# Patient Record
Sex: Female | Born: 1965
Health system: Southern US, Community
[De-identification: ages and names within clinical notes are randomized; demographics above are authoritative.]

## PROBLEM LIST (undated history)

## (undated) DIAGNOSIS — G2581 Restless legs syndrome: Secondary | ICD-10-CM

## (undated) DIAGNOSIS — G43909 Migraine, unspecified, not intractable, without status migrainosus: Secondary | ICD-10-CM

## (undated) DIAGNOSIS — C029 Malignant neoplasm of tongue, unspecified: Secondary | ICD-10-CM

## (undated) DIAGNOSIS — R197 Diarrhea, unspecified: Secondary | ICD-10-CM

## (undated) DIAGNOSIS — D649 Anemia, unspecified: Secondary | ICD-10-CM

## (undated) DIAGNOSIS — F419 Anxiety disorder, unspecified: Secondary | ICD-10-CM

## (undated) DIAGNOSIS — I10 Essential (primary) hypertension: Secondary | ICD-10-CM

## (undated) DIAGNOSIS — G4730 Sleep apnea, unspecified: Secondary | ICD-10-CM

## (undated) DIAGNOSIS — M419 Scoliosis, unspecified: Secondary | ICD-10-CM

## (undated) DIAGNOSIS — Z8719 Personal history of other diseases of the digestive system: Secondary | ICD-10-CM

## (undated) DIAGNOSIS — R0602 Shortness of breath: Secondary | ICD-10-CM

## (undated) DIAGNOSIS — B019 Varicella without complication: Secondary | ICD-10-CM

## (undated) DIAGNOSIS — G4733 Obstructive sleep apnea (adult) (pediatric): Secondary | ICD-10-CM

## (undated) DIAGNOSIS — Z5189 Encounter for other specified aftercare: Secondary | ICD-10-CM

## (undated) DIAGNOSIS — T7840XA Allergy, unspecified, initial encounter: Secondary | ICD-10-CM

## (undated) DIAGNOSIS — M1990 Unspecified osteoarthritis, unspecified site: Secondary | ICD-10-CM

## (undated) DIAGNOSIS — F329 Major depressive disorder, single episode, unspecified: Secondary | ICD-10-CM

## (undated) DIAGNOSIS — J189 Pneumonia, unspecified organism: Secondary | ICD-10-CM

## (undated) DIAGNOSIS — R51 Headache: Secondary | ICD-10-CM

## (undated) DIAGNOSIS — K219 Gastro-esophageal reflux disease without esophagitis: Secondary | ICD-10-CM

## (undated) DIAGNOSIS — F32A Depression, unspecified: Secondary | ICD-10-CM

## (undated) DIAGNOSIS — Z87442 Personal history of urinary calculi: Secondary | ICD-10-CM

## (undated) DIAGNOSIS — C801 Malignant (primary) neoplasm, unspecified: Secondary | ICD-10-CM

## (undated) DIAGNOSIS — Z923 Personal history of irradiation: Secondary | ICD-10-CM

## (undated) HISTORY — DX: Anemia, unspecified: D64.9

## (undated) HISTORY — DX: Obstructive sleep apnea (adult) (pediatric): G47.33

## (undated) HISTORY — DX: Migraine, unspecified, not intractable, without status migrainosus: G43.909

## (undated) HISTORY — DX: Major depressive disorder, single episode, unspecified: F32.9

## (undated) HISTORY — DX: Allergy, unspecified, initial encounter: T78.40XA

## (undated) HISTORY — DX: Depression, unspecified: F32.A

## (undated) HISTORY — PX: UPPER GASTROINTESTINAL ENDOSCOPY: SHX188

## (undated) HISTORY — DX: Sleep apnea, unspecified: G47.30

## (undated) HISTORY — PX: TONSILLECTOMY: SUR1361

## (undated) HISTORY — DX: Encounter for other specified aftercare: Z51.89

## (undated) HISTORY — DX: Malignant neoplasm of tongue, unspecified: C02.9

## (undated) HISTORY — DX: Varicella without complication: B01.9

---
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
## 1988-06-18 HISTORY — PX: TUBAL LIGATION: SHX77

## 1999-11-02 ENCOUNTER — Encounter (INDEPENDENT_AMBULATORY_CARE_PROVIDER_SITE_OTHER): Payer: Self-pay | Admitting: *Deleted

## 1999-11-02 ENCOUNTER — Ambulatory Visit (HOSPITAL_BASED_OUTPATIENT_CLINIC_OR_DEPARTMENT_OTHER): Admission: RE | Admit: 1999-11-02 | Discharge: 1999-11-02 | Payer: Self-pay | Admitting: General Surgery

## 2001-10-09 ENCOUNTER — Other Ambulatory Visit: Admission: RE | Admit: 2001-10-09 | Discharge: 2001-10-09 | Payer: Self-pay | Admitting: Family Medicine

## 2002-07-09 ENCOUNTER — Encounter: Admission: RE | Admit: 2002-07-09 | Discharge: 2002-08-02 | Payer: Self-pay | Admitting: Family Medicine

## 2002-10-15 ENCOUNTER — Other Ambulatory Visit: Admission: RE | Admit: 2002-10-15 | Discharge: 2002-10-15 | Payer: Self-pay | Admitting: Family Medicine

## 2002-10-17 ENCOUNTER — Encounter: Payer: Self-pay | Admitting: Family Medicine

## 2002-10-17 ENCOUNTER — Encounter: Admission: RE | Admit: 2002-10-17 | Discharge: 2002-10-17 | Payer: Self-pay | Admitting: Family Medicine

## 2004-02-04 ENCOUNTER — Ambulatory Visit: Payer: Self-pay | Admitting: Family Medicine

## 2005-10-21 ENCOUNTER — Ambulatory Visit: Payer: Self-pay | Admitting: Obstetrics and Gynecology

## 2005-10-21 ENCOUNTER — Encounter: Payer: Self-pay | Admitting: Obstetrics and Gynecology

## 2007-01-19 ENCOUNTER — Emergency Department (HOSPITAL_COMMUNITY): Admission: EM | Admit: 2007-01-19 | Discharge: 2007-01-19 | Payer: Self-pay | Admitting: Emergency Medicine

## 2008-03-08 ENCOUNTER — Ambulatory Visit (HOSPITAL_COMMUNITY): Admission: RE | Admit: 2008-03-08 | Discharge: 2008-03-08 | Payer: Self-pay | Admitting: Obstetrics & Gynecology

## 2009-04-09 ENCOUNTER — Emergency Department (HOSPITAL_COMMUNITY): Admission: EM | Admit: 2009-04-09 | Discharge: 2009-04-09 | Payer: Self-pay | Admitting: Emergency Medicine

## 2009-04-09 ENCOUNTER — Emergency Department (HOSPITAL_COMMUNITY): Admission: EM | Admit: 2009-04-09 | Discharge: 2009-04-10 | Payer: Self-pay | Admitting: Emergency Medicine

## 2010-02-04 ENCOUNTER — Emergency Department (HOSPITAL_COMMUNITY)
Admission: EM | Admit: 2010-02-04 | Discharge: 2010-02-04 | Payer: Self-pay | Source: Home / Self Care | Admitting: Emergency Medicine

## 2010-02-09 LAB — CBC
HCT: 29.7 % — ABNORMAL LOW (ref 36.0–46.0)
Hemoglobin: 9.1 g/dL — ABNORMAL LOW (ref 12.0–15.0)
MCH: 23.4 pg — ABNORMAL LOW (ref 26.0–34.0)
MCHC: 30.6 g/dL (ref 30.0–36.0)
MCV: 76.3 fL — ABNORMAL LOW (ref 78.0–100.0)
Platelets: 259 10*3/uL (ref 150–400)
RBC: 3.89 MIL/uL (ref 3.87–5.11)
RDW: 16.4 % — ABNORMAL HIGH (ref 11.5–15.5)

## 2010-02-09 LAB — POCT CARDIAC MARKERS
Myoglobin, poc: 60.7 ng/mL (ref 12–200)
Troponin i, poc: <0.05 ng/mL (ref 0.00–0.09)

## 2010-02-13 ENCOUNTER — Other Ambulatory Visit (HOSPITAL_COMMUNITY): Payer: Self-pay | Admitting: *Deleted

## 2010-02-13 DIAGNOSIS — Z1231 Encounter for screening mammogram for malignant neoplasm of breast: Secondary | ICD-10-CM

## 2010-02-13 DIAGNOSIS — Z139 Encounter for screening, unspecified: Secondary | ICD-10-CM

## 2010-03-02 ENCOUNTER — Ambulatory Visit: Payer: Self-pay | Admitting: Endocrinology

## 2010-03-09 ENCOUNTER — Ambulatory Visit: Payer: Self-pay | Admitting: Cardiology

## 2010-03-10 ENCOUNTER — Ambulatory Visit (HOSPITAL_COMMUNITY)
Admission: RE | Admit: 2010-03-10 | Discharge: 2010-03-10 | Disposition: A | Discharge status: Home / Self Care | Payer: BC Managed Care – PPO | Source: Ambulatory Visit | Attending: Neurology | Admitting: Neurology

## 2010-03-10 DIAGNOSIS — Z1231 Encounter for screening mammogram for malignant neoplasm of breast: Secondary | ICD-10-CM | POA: Insufficient documentation

## 2010-03-11 ENCOUNTER — Other Ambulatory Visit: Payer: Self-pay | Admitting: Obstetrics

## 2010-03-11 ENCOUNTER — Other Ambulatory Visit: Payer: Self-pay | Admitting: Family Medicine

## 2010-03-11 DIAGNOSIS — R928 Other abnormal and inconclusive findings on diagnostic imaging of breast: Secondary | ICD-10-CM

## 2010-03-12 ENCOUNTER — Other Ambulatory Visit: Payer: Self-pay | Admitting: Obstetrics

## 2010-03-12 ENCOUNTER — Other Ambulatory Visit: Payer: Self-pay | Admitting: Family Medicine

## 2010-03-12 DIAGNOSIS — R928 Other abnormal and inconclusive findings on diagnostic imaging of breast: Secondary | ICD-10-CM

## 2010-03-18 ENCOUNTER — Encounter (INDEPENDENT_AMBULATORY_CARE_PROVIDER_SITE_OTHER): Payer: Self-pay | Admitting: *Deleted

## 2010-03-19 ENCOUNTER — Ambulatory Visit: Payer: Self-pay | Admitting: Endocrinology

## 2010-03-19 DIAGNOSIS — Z0289 Encounter for other administrative examinations: Secondary | ICD-10-CM

## 2010-03-20 ENCOUNTER — Ambulatory Visit
Admission: RE | Admit: 2010-03-20 | Discharge: 2010-03-20 | Disposition: A | Discharge status: Home / Self Care | Payer: BC Managed Care – PPO | Source: Ambulatory Visit | Attending: Family Medicine | Admitting: Family Medicine

## 2010-03-20 DIAGNOSIS — R928 Other abnormal and inconclusive findings on diagnostic imaging of breast: Secondary | ICD-10-CM

## 2010-03-26 ENCOUNTER — Ambulatory Visit: Payer: BC Managed Care – PPO | Admitting: Internal Medicine

## 2010-03-26 NOTE — Letter (Signed)
Summary: Appointment - Missed  [facility], [place]  [address] [place]   [place], [place] [address]   Phone: [phone]  Fax: [phone]        March 18, 2010 MRN: [id number]     Kristen [name] [address] [place], [place]  [address]     Dear Kristen [name],  [facility] records indicate you missed your appointment on  March 09, 2010 at 11:00am with Dr. Aundra Dubin. It is very important that we reach you to reschedule this appointment. We look forward to participating in your health care needs. Please contact us at the number listed above at your earliest convenience to reschedule this appointment.     Sincerely,  [facility] Scheduling Team

## 2010-04-02 ENCOUNTER — Ambulatory Visit: Payer: BC Managed Care – PPO | Admitting: Internal Medicine

## 2010-04-02 DIAGNOSIS — Z0289 Encounter for other administrative examinations: Secondary | ICD-10-CM

## 2010-04-10 LAB — COMPREHENSIVE METABOLIC PANEL
ALT: 12 U/L (ref 0–35)
AST: 16 U/L (ref 0–37)
Albumin: 3.4 g/dL — ABNORMAL LOW (ref 3.5–5.2)
Alkaline Phosphatase: 75 U/L (ref 39–117)
BUN: 9 mg/dL (ref 6–23)
CO2: 22 mEq/L (ref 19–32)
Calcium: 8.1 mg/dL — ABNORMAL LOW (ref 8.4–10.5)
Chloride: 108 mEq/L (ref 96–112)
Creatinine, Ser: 0.83 mg/dL (ref 0.4–1.2)
GFR calc Af Amer: >60 mL/min (ref >60)
GFR calc non Af Amer: >60 mL/min (ref >60)
Glucose, Bld: 106 mg/dL — ABNORMAL HIGH (ref 70–99)
Potassium: 3.4 mEq/L — ABNORMAL LOW (ref 3.5–5.1)
Sodium: 138 mEq/L (ref 135–145)
Total Bilirubin: 0.9 mg/dL (ref 0.3–1.2)
Total Protein: 7 g/dL (ref 6.0–8.3)

## 2010-04-10 LAB — URINALYSIS, ROUTINE W REFLEX MICROSCOPIC
Bilirubin Urine: NEGATIVE
Glucose, UA: NEGATIVE mg/dL
Hgb urine dipstick: NEGATIVE
Ketones, ur: NEGATIVE mg/dL
Nitrite: POSITIVE — AB
Protein, ur: NEGATIVE mg/dL
Specific Gravity, Urine: 1.021 (ref 1.005–1.030)
Urobilinogen, UA: 1 mg/dL (ref 0.0–1.0)
pH: 6 (ref 5.0–8.0)

## 2010-04-10 LAB — CBC
HCT: 30.8 % — ABNORMAL LOW (ref 36.0–46.0)
Hemoglobin: 9.8 g/dL — ABNORMAL LOW (ref 12.0–15.0)
MCHC: 31.6 g/dL (ref 30.0–36.0)
MCV: 72.9 fL — ABNORMAL LOW (ref 78.0–100.0)
Platelets: 261 10*3/uL (ref 150–400)
RBC: 4.23 MIL/uL (ref 3.87–5.11)
RDW: 17.9 % — ABNORMAL HIGH (ref 11.5–15.5)
WBC: 4.8 10*3/uL (ref 4.0–10.5)

## 2010-04-10 LAB — DIFFERENTIAL
Eosinophils Relative: 0 % (ref 0–5)
Lymphocytes Relative: 25 % (ref 12–46)
Lymphs Abs: 1.2 10*3/uL (ref 0.7–4.0)
Monocytes Absolute: 0.3 10*3/uL (ref 0.1–1.0)
Monocytes Relative: 6 % (ref 3–12)

## 2010-04-10 LAB — URINE CULTURE

## 2010-04-10 LAB — URINE MICROSCOPIC-ADD ON

## 2010-04-10 LAB — POCT PREGNANCY, URINE: Preg Test, Ur: NEGATIVE

## 2010-04-10 LAB — LIPASE, BLOOD: Lipase: 20 U/L (ref 11–59)

## 2010-04-21 ENCOUNTER — Encounter: Payer: Self-pay | Admitting: Cardiology

## 2010-06-05 NOTE — Group Therapy Note (Signed)
NAME:  Kristen [name], Kristen [name] NO.:  [id number]   MEDICAL RECORD NO.:  [id number]          PATIENT TYPE:  WOC   LOCATION:  [facility]                   FACILITY:  [facility]   PHYSICIAN:  Andrew Au, MD        DATE OF BIRTH:  03/29/1965   DATE OF SERVICE:  10/21/2005                                    CLINIC NOTE   The patient is a 44 year old gravida 57, para 3-0-0-3, with an unremarkable  past medical history except for at age of 31 had the placement of a steel  rod for scoliosis in the back.  She is in pretty good health except for her  weight, which is somewhat elevated at 251, but she is 5 feet 9 inches tall.  She is in four a routine Pap smear and during our conversation, we got onto  the subject of any other problems going on with her and she stated that she  was having very heavy periods that were regular but lasted for 5-7 days and  passed huge clots.  She is not taking any iron therapy, and we suggested she  do that.  We are going to need a CBC and follow her up as far as that  problem goes.  She also discussed the fact that she has severe migraines  since the age of 45 that have been more common lately because of the death  of her father.  She gets usually at least one every 3 weeks.  I talked with  her in detail about trigger mechanisms and treatment for migraine.  I told  her we would try her on some propranolol suppression 40 mg b.i.d. as well as  Imitrex 100 mg, which we will give her to treat the headaches with.  I told  her she should follow up with an internal medicine specialist for that  problem.   PHYSICAL EXAMINATION:  Genitalia is normal.  BUS within normal limits.  Vagina is clean and well-rugated.  The cervix is clean and parous.  The  uterus is anterior, normal size, shape and consistency.  Adnexa could not be  well-palpated.   The patient was given a prescription for propranolol and Imitrex.  Pap smear  done.     ______________________________  Andrew Au, MD     PR/MEDQ  D:  10/21/2005  T:  10/22/2005  Job:  [id number]

## 2010-06-05 NOTE — Op Note (Signed)
[place]  Patient:    Kristen [name], Kristen [name]                    MRN: [id number] Proc. Date: 11/02/99 Adm. Date:  [id number] Attending:  Barbera Setters CC:         Candace Gallus, M.D., [place]   Operative Report  PREOPERATIVE DIAGNOSIS:  Soft tissue mass, right buttock.  POSTOPERATIVE DIAGNOSIS:  Soft tissue mass, right buttock.  PROCEDURE:  Excision soft tissue mass, right buttock (dimensions 3.0 x 2.0 x 2.0 cm).  SURGEON:  Edsel Petrin. Dalbert Batman, M.D.  OPERATIVE INDICATIONS:  This is a 45 year old black female who has had a soft tissue mass at the base of her right buttock near the perineum all of her life.  This has been enlarging over the past year or two.  This has never been inflamed and has never had any drainage.  It is becoming large enough to interfere with ambulation, and she would like this excised.  One examination, she has a 3.0 x 2.0 cm protuberant soft tissue mass at the base of the right buttock very close to the midline.  This suggests a broad-based lipoma.  There is no adenopathy.  She is brought to the operating room electively.  DESCRIPTION OF PROCEDURE:  Following the induction of general endotracheal anesthesia, the patient was placed in the dorsal lithotomy position.  The perineum and buttock were prepped and draped in a sterile fashion.  Incision was marked with a marking pen slightly oblique in Langers lines.  The tissues were infiltrated with 0.5% Marcaine with epinephrine.  An oblique elliptical incision was made around the base of this mass.  The incision itself was about 5 cm in length.  With sharp dissection and electrocautery, we excised what appeared to be a large fatty tumor.  This extended into the depths of the subcutaneous tissue for a short distance, and all of this fatty tissue was excised.  This had a benign appearance and texture.  This specimen was sent for routine  histology.  The wound was irrigated with saline.  Hemostasis was achieved with electrocautery and was excellent.  The skin was closed with a running subcuticular suture of 3-0 Vicryl and Steri-Strips.  A clean bandage was placed, and the patient was taken to the recovery room in stable condition.  Estimated blood loss was about 10 cc.  Complications:  None. Sponge, needle, and instrument counts were correct. DD:  11/02/99 TD:  11/02/99 Job: [id number]

## 2010-06-19 ENCOUNTER — Other Ambulatory Visit (HOSPITAL_COMMUNITY): Payer: BC Managed Care – PPO

## 2010-06-26 ENCOUNTER — Ambulatory Visit (HOSPITAL_COMMUNITY): Admission: RE | Admit: 2010-06-26 | Payer: Self-pay | Source: Ambulatory Visit | Admitting: Obstetrics

## 2010-10-07 LAB — URINE CULTURE: Colony Count: >=100000

## 2010-10-07 LAB — POCT URINALYSIS DIP (DEVICE)
Bilirubin Urine: NEGATIVE
Glucose, UA: NEGATIVE
Nitrite: POSITIVE — AB

## 2010-11-07 ENCOUNTER — Inpatient Hospital Stay (INDEPENDENT_AMBULATORY_CARE_PROVIDER_SITE_OTHER)
Admission: RE | Admit: 2010-11-07 | Discharge: 2010-11-07 | Disposition: A | Discharge status: Home / Self Care | Payer: BC Managed Care – PPO | Source: Ambulatory Visit | Attending: Emergency Medicine | Admitting: Emergency Medicine

## 2010-11-07 DIAGNOSIS — M5430 Sciatica, unspecified side: Secondary | ICD-10-CM

## 2011-06-16 ENCOUNTER — Emergency Department (INDEPENDENT_AMBULATORY_CARE_PROVIDER_SITE_OTHER): Payer: BC Managed Care – PPO

## 2011-06-16 ENCOUNTER — Emergency Department (INDEPENDENT_AMBULATORY_CARE_PROVIDER_SITE_OTHER)
Admission: EM | Admit: 2011-06-16 | Discharge: 2011-06-16 | Disposition: A | Discharge status: Home / Self Care | Payer: BC Managed Care – PPO | Source: Home / Self Care | Attending: Emergency Medicine | Admitting: Emergency Medicine

## 2011-06-16 ENCOUNTER — Encounter (HOSPITAL_COMMUNITY): Payer: Self-pay | Admitting: *Deleted

## 2011-06-16 DIAGNOSIS — S43429A Sprain of unspecified rotator cuff capsule, initial encounter: Secondary | ICD-10-CM

## 2011-06-16 DIAGNOSIS — S20219A Contusion of unspecified front wall of thorax, initial encounter: Secondary | ICD-10-CM

## 2011-06-16 DIAGNOSIS — S46019A Strain of muscle(s) and tendon(s) of the rotator cuff of unspecified shoulder, initial encounter: Secondary | ICD-10-CM

## 2011-06-16 DIAGNOSIS — S139XXA Sprain of joints and ligaments of unspecified parts of neck, initial encounter: Secondary | ICD-10-CM

## 2011-06-16 DIAGNOSIS — S161XXA Strain of muscle, fascia and tendon at neck level, initial encounter: Secondary | ICD-10-CM

## 2011-06-16 HISTORY — DX: Essential (primary) hypertension: I10

## 2011-06-16 MED ORDER — TRAMADOL HCL 50 MG PO TABS: 100.0000 mg | ORAL_TABLET | Freq: Three times a day (TID) | ORAL | Status: AC | PRN

## 2011-06-16 MED ORDER — METHOCARBAMOL 500 MG PO TABS: 500.0000 mg | ORAL_TABLET | Freq: Three times a day (TID) | ORAL | Status: AC

## 2011-06-16 MED ORDER — MELOXICAM 15 MG PO TABS: 15.0000 mg | ORAL_TABLET | Freq: Every day | ORAL | Status: AC

## 2011-06-16 NOTE — Discharge Instructions (Signed)
Cervical Sprain A cervical sprain is an injury in the neck in which the ligaments are stretched or torn. The ligaments are the tissues that hold the bones of the neck (vertebrae) in place.Cervical sprains can range from very mild to very severe. Most cervical sprains get better in 1 to 3 weeks, but it depends on the cause and extent of the injury. Severe cervical sprains can cause the neck vertebrae to be unstable. This can lead to damage of the spinal cord and can result in serious nervous system problems. Your caregiver will determine whether your cervical sprain is mild or severe. CAUSES  Severe cervical sprains may be caused by:  Contact sport injuries (football, rugby, wrestling, hockey, auto racing, gymnastics, diving, martial arts, boxing).   Motor vehicle collisions.   Whiplash injuries. This means the neck is forcefully whipped backward and forward.   Falls.  Mild cervical sprains may be caused by:   Awkward positions, such as cradling a telephone between your ear and shoulder.   Sitting in a chair that does not offer proper support.   Working at a poorly Landscape architect station.   Activities that require looking up or down for long periods of time.  SYMPTOMS   Pain, soreness, stiffness, or a burning sensation in the front, back, or sides of the neck. This discomfort may develop immediately after injury or it may develop slowly and not begin for 24 hours or more after an injury.   Pain or tenderness directly in the middle of the back of the neck.   Shoulder or upper back pain.   Limited ability to move the neck.   Headache.   Dizziness.   Weakness, numbness, or tingling in the hands or arms.   Muscle spasms.   Difficulty swallowing or chewing.   Tenderness and swelling of the neck.  DIAGNOSIS  Most of the time, your caregiver can diagnose this problem by taking your history and doing a physical exam. Your caregiver will ask about any known problems, such as  arthritis in the neck or a previous neck injury. X-rays may be taken to find out if there are any other problems, such as problems with the bones of the neck. However, an X-ray often does not reveal the full extent of a cervical sprain. Other tests such as a computed tomography (CT) scan or magnetic resonance imaging (MRI) may be needed. TREATMENT  Treatment depends on the severity of the cervical sprain. Mild sprains can be treated with rest, keeping the neck in place (immobilization), and pain medicines. Severe cervical sprains need immediate immobilization and an appointment with an orthopedist or neurosurgeon. Several treatment options are available to help with pain, muscle spasms, and other symptoms. Your caregiver may prescribe:  Medicines, such as pain relievers, numbing medicines, or muscle relaxants.   Physical therapy. This can include stretching exercises, strengthening exercises, and posture training. Exercises and improved posture can help stabilize the neck, strengthen muscles, and help stop symptoms from returning.   A neck collar to be worn for short periods of time. Often, these collars are worn for comfort. However, certain collars may be worn to protect the neck and prevent further worsening of a serious cervical sprain.  HOME CARE INSTRUCTIONS   Put ice on the injured area.   Put ice in a plastic bag.   Place a towel between your skin and the bag.   Leave the ice on for 15 to 20 minutes, 3 to 4 times a day.  Only take over-the-counter or prescription medicines for pain, discomfort, or fever as directed by your caregiver.   Keep all follow-up appointments as directed by your caregiver.   Keep all physical therapy appointments as directed by your caregiver.   If a neck collar is prescribed, wear it as directed by your caregiver.   Do not drive while wearing a neck collar.   Make any needed adjustments to your work station to promote good posture.   Avoid positions  and activities that make your symptoms worse.   Warm up and stretch before being active to help prevent problems.  SEEK MEDICAL CARE IF:   Your pain is not controlled with medicine.   You are unable to decrease your pain medicine over time as planned.   Your activity level is not improving as expected.  SEEK IMMEDIATE MEDICAL CARE IF:   You develop any bleeding, stomach upset, or signs of an allergic reaction to your medicine.   Your symptoms get worse.   You develop new, unexplained symptoms.   You have numbness, tingling, weakness, or paralysis in any part of your body.  MAKE SURE YOU:   Understand these instructions.   Will watch your condition.   Will get help right away if you are not doing well or get worse.  Document Released: 11/01/2006 Document Revised: 12/24/2010 Document Reviewed: 10/07/2010 [name] Patient Information 2012 [place].Bone Bruise  A bone bruise is a small hidden fracture of the bone. It typically occurs with bones located close to the surface of the skin.  SYMPTOMS  The pain lasts longer than a normal bruise.   The bruised area is difficult to use.   There may be discoloration or swelling of the bruised area.   When a bone bruise is found with injury to the anterior cruciate ligament (in the knee) there is often an increased:   Amount of fluid in the knee   Time the fluid in the knee lasts.   Number of days until you are walking normally and regaining the motion you had before the injury.   Number of days with pain from the injury.  DIAGNOSIS  It can only be seen on X-rays known as MRIs. This stands for magnetic resonance imaging. A regular X-ray taken of a bone bruise would appear to be normal. A bone bruise is a common injury in the knee and the heel bone (calcaneus). The problems are similar to those produced by stress fractures, which are bone injuries caused by overuse. A bone bruise may also be a sign of other injuries. For  example, bone bruises are commonly found where an anterior cruciate ligament (ACL) in the knee has been pulled away from the bone (ruptured). A ligament is a tough fibrous material that connects bones together to make our joints stable. Bruises of the bone last a lot longer than bruises of the muscle or tissues beneath the skin. Bone bruises can last from days to months and are often more severe and painful than other bruises. TREATMENT Because bone bruises are sudden injuries you cannot often prevent them, other than by being extremely careful. Some things you can do to improve the condition are:  Apply ice to the sore area for 15 to 20 minutes, 3 to 4 times per day while awake for the first 2 days. Put the ice in a plastic bag, and place a towel between the bag of ice and your skin.   Keep your bruised area raised (elevated) when possible  to lessen swelling.   For activity:   Use crutches when necessary; do not put weight on the injured leg until you are no longer tender.   You may walk on your affected part as the pain allows, or as instructed.   Start weight bearing gradually on the bruised part.   Continue to use crutches or a cane until you can stand without causing pain, or as instructed.   If a plaster splint was applied, wear the splint until you are seen for a follow-up examination. Rest it on nothing harder than a pillow the first 24 hours. Do not put weight on it. Do not get it wet. You may take it off to take a shower or bath.   If an air splint was applied, more air may be blown into or out of the splint as needed for comfort. You may take it off at night and to take a shower or bath.   Wiggle your toes in the splint several times per day if you are able.   You may have been given an elastic bandage to use with the plaster splint or alone. The splint is too tight if you have numbness, tingling or if your foot becomes cold and blue. Adjust the bandage to make it comfortable.    Only take over-the-counter or prescription medicines for pain, discomfort, or fever as directed by your caregiver.   Follow all instructions for follow up with your caregiver. This includes any orthopedic referrals, physical therapy, and rehabilitation. Any delay in obtaining necessary care could result in a delay or failure of the bones to heal.  SEEK MEDICAL CARE IF:   You have an increase in bruising, swelling, or pain.   You notice coldness of your toes.   You do not get pain relief with medications.  SEEK IMMEDIATE MEDICAL CARE IF:   Your toes are numb or blue.   You have severe pain not controlled with medications.   If any of the problems that caused you to seek care are becoming worse.  Document Released: 03/27/2003 Document Revised: 12/24/2010 Document Reviewed: 08/09/2007 [facility] Patient Information 2012 [place].Chest Contusion You have been checked for injuries to your chest. Your caregiver has not found injuries serious enough to require hospitalization. It is common to have bruises and sore muscles after an injury. These tend to feel worse the first 24 hours. You may gradually develop more stiffness and soreness over the next several hours to several days. This usually feels worse the first morning following your injury. After a few days, you will usually begin to improve. The amount of improvement depends on the amount of damage. Following the accident, if the pain in any area continues to increase or you develop new areas of pain, you should see your primary caregiver or return to the Emergency Department for re-evaluation. HOME CARE INSTRUCTIONS   Put ice on sore areas every 2 hours for 20 minutes while awake for the next 2 days.   Drink extra fluids. Do not drink alcohol.   Activity as tolerated. Lifting may make pain worse.   Only take over-the-counter or prescription medicines for pain, discomfort, or fever as directed by your caregiver. Do not use  aspirin. This may increase bruising or increase bleeding.  SEEK IMMEDIATE MEDICAL CARE IF:   There is a worsening of any of the problems that brought you in for care.   Shortness of breath, dizziness or fainting develop.   You have chest pain, difficulty  breathing, or develop pain going down the left arm or up into jaw.   You feel sick to your stomach (nausea), vomiting or sweats.   You have increasing belly (abdominal) discomfort.   There is blood in your urine, stool, or if you vomit blood.   There is pain in either shoulder in an area where a shoulder strap would be.   You have feelings of lightheadedness, or if you should have a fainting episode.   You have numbness, tingling, weakness, or problems with the use of your arms or legs.   Severe headaches not relieved with medications develop.   You have a change in bowel or bladder control.   There is increasing pain in any areas of the body.  If you feel your symptoms are worsening, and you are not able to see your primary caregiver, return to the Emergency Department immediately. MAKE SURE YOU:   Understand these instructions.   Will watch your condition.   Will get help right away if you are not doing well or get worse.  Document Released: 09/29/2000 Document Revised: 12/24/2010 Document Reviewed: 08/23/2007 [facility] Patient Information 2012 [place].Rotator Cuff Injury The rotator cuff is the collective set of muscles and tendons that make up the stabilizing unit of your shoulder. This unit holds in the ball of the humerus (upper arm bone) in the socket of the scapula (shoulder blade). Injuries to this stabilizing unit most commonly come from sports or activities that cause the arm to be moved repeatedly over the head. Examples of this include throwing, weight lifting, swimming, racquet sports, or an injury such as falling on your arm. Chronic (longstanding) irritation of this unit can cause inflammation (soreness),  bursitis, and eventual damage to the tendons to the point of rupture (tear). An acute (sudden) injury of the rotator cuff can result in a partial or complete tear. You may need surgery with complete tears. Small or partial rotator cuff tears may be treated conservatively with temporary immobilization, exercises and rest. Physical therapy may be needed. HOME CARE INSTRUCTIONS   Apply ice to the injury for 15 to 20 minutes 3 to 4 times per day for the first 2 days. Put the ice in a plastic bag and place a towel between the bag of ice and your skin.   If you have a shoulder immobilizer (sling and straps), do not remove it for as long as directed by your caregiver or until you see a caregiver for a follow-up examination. If you need to remove it, move your arm as little as possible.   You may want to sleep on several pillows or in a recliner at night to lessen swelling and pain.   Only take over-the-counter or prescription medicines for pain, discomfort, or fever as directed by your caregiver.   Do simple hand squeezing exercises with a soft rubber ball to decrease hand swelling.  SEEK MEDICAL CARE IF:   Pain in your shoulder increases or new pain or numbness develops in your arm, hand, or fingers.   Your hand or fingers are colder than your other hand.  SEEK IMMEDIATE MEDICAL CARE IF:   Your arm, hand, or fingers are numb or tingling.   Your arm, hand, or fingers are increasingly swollen and painful, or turn white or blue.  Document Released: 01/02/2000 Document Revised: 12/24/2010 Document Reviewed: 12/26/2007 [facility] Patient Information 2012 [place].
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
## 2011-06-23 ENCOUNTER — Other Ambulatory Visit: Payer: Self-pay | Admitting: Obstetrics

## 2012-10-16 ENCOUNTER — Other Ambulatory Visit: Payer: Self-pay

## 2012-10-16 DIAGNOSIS — Z1231 Encounter for screening mammogram for malignant neoplasm of breast: Secondary | ICD-10-CM

## 2012-11-13 ENCOUNTER — Ambulatory Visit
Admission: RE | Admit: 2012-11-13 | Discharge: 2012-11-13 | Disposition: A | Discharge status: Home / Self Care | Payer: BC Managed Care – PPO | Source: Ambulatory Visit

## 2012-11-13 DIAGNOSIS — Z1231 Encounter for screening mammogram for malignant neoplasm of breast: Secondary | ICD-10-CM

## 2013-01-08 ENCOUNTER — Encounter (HOSPITAL_COMMUNITY): Payer: Self-pay | Admitting: Emergency Medicine

## 2013-01-08 ENCOUNTER — Inpatient Hospital Stay (HOSPITAL_COMMUNITY)
Admission: EM | Admit: 2013-01-08 | Discharge: 2013-01-10 | DRG: 392 | Disposition: A | Discharge status: Home / Self Care | Payer: BC Managed Care – PPO | Attending: Internal Medicine | Admitting: Internal Medicine

## 2013-01-08 ENCOUNTER — Emergency Department (HOSPITAL_COMMUNITY): Payer: BC Managed Care – PPO

## 2013-01-08 DIAGNOSIS — Z87442 Personal history of urinary calculi: Secondary | ICD-10-CM

## 2013-01-08 DIAGNOSIS — M4120 Other idiopathic scoliosis, site unspecified: Secondary | ICD-10-CM | POA: Diagnosis present

## 2013-01-08 DIAGNOSIS — R070 Pain in throat: Secondary | ICD-10-CM | POA: Diagnosis present

## 2013-01-08 DIAGNOSIS — I10 Essential (primary) hypertension: Secondary | ICD-10-CM | POA: Diagnosis present

## 2013-01-08 DIAGNOSIS — Z6838 Body mass index (BMI) 38.0-38.9, adult: Secondary | ICD-10-CM

## 2013-01-08 DIAGNOSIS — D638 Anemia in other chronic diseases classified elsewhere: Secondary | ICD-10-CM | POA: Diagnosis present

## 2013-01-08 DIAGNOSIS — K5289 Other specified noninfective gastroenteritis and colitis: Principal | ICD-10-CM

## 2013-01-08 DIAGNOSIS — D649 Anemia, unspecified: Secondary | ICD-10-CM | POA: Diagnosis present

## 2013-01-08 DIAGNOSIS — K648 Other hemorrhoids: Secondary | ICD-10-CM | POA: Diagnosis present

## 2013-01-08 DIAGNOSIS — K529 Noninfective gastroenteritis and colitis, unspecified: Secondary | ICD-10-CM

## 2013-01-08 DIAGNOSIS — K644 Residual hemorrhoidal skin tags: Secondary | ICD-10-CM | POA: Diagnosis present

## 2013-01-08 HISTORY — DX: Scoliosis, unspecified: M41.9

## 2013-01-08 LAB — CBC WITH DIFFERENTIAL/PLATELET
Band Neutrophils: 0 % (ref 0–10)
Basophils Absolute: 0 K/uL (ref 0.0–0.1)
Basophils Relative: 0 % (ref 0–1)
Blasts: 0 %
Eosinophils Absolute: 0 K/uL (ref 0.0–0.7)
Eosinophils Relative: 0 % (ref 0–5)
HCT: 34.2 % — ABNORMAL LOW (ref 36.0–46.0)
Hemoglobin: 10.9 g/dL — ABNORMAL LOW (ref 12.0–15.0)
Lymphocytes Relative: 26 % (ref 12–46)
Lymphs Abs: 2 10*3/uL (ref 0.7–4.0)
MCH: 24.5 pg — ABNORMAL LOW (ref 26.0–34.0)
MCHC: 31.9 g/dL (ref 30.0–36.0)
MCV: 76.9 fL — ABNORMAL LOW (ref 78.0–100.0)
Metamyelocytes Relative: 0 %
Monocytes Absolute: 0.5 K/uL (ref 0.1–1.0)
Monocytes Relative: 6 % (ref 3–12)
Myelocytes: 0 %
Neutro Abs: 5.2 10*3/uL (ref 1.7–7.7)
Neutrophils Relative %: 68 % (ref 43–77)
Platelets: 271 10*3/uL (ref 150–400)
Promyelocytes Absolute: 0 %
RBC: 4.45 MIL/uL (ref 3.87–5.11)
RDW: 16.4 % — ABNORMAL HIGH (ref 11.5–15.5)
WBC: 7.7 10*3/uL (ref 4.0–10.5)
nRBC: 0 /100 WBC

## 2013-01-08 LAB — CREATININE, SERUM
Creatinine, Ser: 0.81 mg/dL (ref 0.50–1.10)
GFR calc non Af Amer: 85 mL/min — ABNORMAL LOW (ref >90)

## 2013-01-08 LAB — CBC
Hemoglobin: 11.3 g/dL — ABNORMAL LOW (ref 12.0–15.0)
MCH: 24.4 pg — ABNORMAL LOW (ref 26.0–34.0)
MCHC: 31.4 g/dL (ref 30.0–36.0)
MCV: 77.6 fL — ABNORMAL LOW (ref 78.0–100.0)
Platelets: 237 10*3/uL (ref 150–400)
RBC: 4.64 MIL/uL (ref 3.87–5.11)

## 2013-01-08 LAB — URINALYSIS, ROUTINE W REFLEX MICROSCOPIC
Bilirubin Urine: NEGATIVE
Glucose, UA: NEGATIVE mg/dL
Ketones, ur: NEGATIVE mg/dL
Leukocytes, UA: NEGATIVE
Nitrite: NEGATIVE
Protein, ur: NEGATIVE mg/dL
Specific Gravity, Urine: 1.015 (ref 1.005–1.030)
Urobilinogen, UA: 1 mg/dL (ref 0.0–1.0)
pH: 7.5 (ref 5.0–8.0)

## 2013-01-08 LAB — COMPREHENSIVE METABOLIC PANEL
ALT: 10 U/L (ref 0–35)
AST: 17 U/L (ref 0–37)
Alkaline Phosphatase: 87 U/L (ref 39–117)
BUN: 10 mg/dL (ref 6–23)
CO2: 24 mEq/L (ref 19–32)
Calcium: 8.4 mg/dL (ref 8.4–10.5)
Chloride: 106 mEq/L (ref 96–112)
GFR calc Af Amer: 89 mL/min — ABNORMAL LOW (ref >90)
GFR calc non Af Amer: 77 mL/min — ABNORMAL LOW (ref >90)
Potassium: 3.6 mEq/L (ref 3.5–5.1)
Sodium: 141 mEq/L (ref 135–145)

## 2013-01-08 LAB — COMPREHENSIVE METABOLIC PANEL WITH GFR
Albumin: 3.3 g/dL — ABNORMAL LOW (ref 3.5–5.2)
Creatinine, Ser: 0.88 mg/dL (ref 0.50–1.10)
Glucose, Bld: 101 mg/dL — ABNORMAL HIGH (ref 70–99)
Total Bilirubin: 0.4 mg/dL (ref 0.3–1.2)
Total Protein: 7 g/dL (ref 6.0–8.3)

## 2013-01-08 LAB — URINE MICROSCOPIC-ADD ON

## 2013-01-08 LAB — PREGNANCY, URINE: Preg Test, Ur: NEGATIVE

## 2013-01-08 LAB — LIPASE, BLOOD: Lipase: 19 U/L (ref 11–59)

## 2013-01-08 MED ORDER — HYDROMORPHONE HCL PF 1 MG/ML IJ SOLN
0.5000 mg | INTRAMUSCULAR | Status: AC | PRN
  Administered 2013-01-08 (×2): 0.5 mg via INTRAVENOUS
  Filled 2013-01-08 (×2): qty 1

## 2013-01-08 MED ORDER — IBUPROFEN 600 MG PO TABS
600.0000 mg | ORAL_TABLET | Freq: Four times a day (QID) | ORAL | Status: DC | PRN
  Filled 2013-01-08: qty 1

## 2013-01-08 MED ORDER — ENOXAPARIN SODIUM 30 MG/0.3ML ~~LOC~~ SOLN
30.0000 mg | SUBCUTANEOUS | Status: DC
  Filled 2013-01-08 (×2): qty 0.3

## 2013-01-08 MED ORDER — CIPROFLOXACIN IN D5W 400 MG/200ML IV SOLN
400.0000 mg | Freq: Two times a day (BID) | INTRAVENOUS | Status: DC
  Administered 2013-01-09 – 2013-01-10 (×3): 400 mg via INTRAVENOUS
  Filled 2013-01-08 (×5): qty 200

## 2013-01-08 MED ORDER — SODIUM CHLORIDE 0.9 % IV SOLN
INTRAVENOUS | Status: DC
  Administered 2013-01-08: 21:00:00 via INTRAVENOUS

## 2013-01-08 MED ORDER — ACETAMINOPHEN 650 MG RE SUPP: 650.0000 mg | Freq: Four times a day (QID) | RECTAL | Status: DC | PRN

## 2013-01-08 MED ORDER — METRONIDAZOLE IN NACL 5-0.79 MG/ML-% IV SOLN
500.0000 mg | Freq: Three times a day (TID) | INTRAVENOUS | Status: DC
  Administered 2013-01-08 – 2013-01-10 (×5): 500 mg via INTRAVENOUS
  Filled 2013-01-08 (×8): qty 100

## 2013-01-08 MED ORDER — SODIUM CHLORIDE 0.45 % IV SOLN
INTRAVENOUS | Status: DC
  Administered 2013-01-08 – 2013-01-09 (×2): via INTRAVENOUS
  Filled 2013-01-08 (×6): qty 1000

## 2013-01-08 MED ORDER — OXYCODONE HCL 5 MG PO TABS
5.0000 mg | ORAL_TABLET | ORAL | Status: DC | PRN
  Administered 2013-01-10 (×2): 5 mg via ORAL
  Filled 2013-01-08 (×2): qty 1

## 2013-01-08 MED ORDER — ONDANSETRON HCL 4 MG PO TABS: 4.0000 mg | ORAL_TABLET | Freq: Four times a day (QID) | ORAL | Status: DC | PRN

## 2013-01-08 MED ORDER — ONDANSETRON HCL 4 MG/2ML IJ SOLN
4.0000 mg | Freq: Three times a day (TID) | INTRAMUSCULAR | Status: AC | PRN
  Filled 2013-01-08: qty 2

## 2013-01-08 MED ORDER — IOHEXOL 300 MG/ML  SOLN
25.0000 mL | INTRAMUSCULAR | Status: AC
  Administered 2013-01-08: 25 mL via ORAL

## 2013-01-08 MED ORDER — SODIUM CHLORIDE 0.9 % IV BOLUS (SEPSIS)
1000.0000 mL | Freq: Once | INTRAVENOUS | Status: AC
  Administered 2013-01-08: 1000 mL via INTRAVENOUS

## 2013-01-08 MED ORDER — IOHEXOL 300 MG/ML  SOLN
100.0000 mL | Freq: Once | INTRAMUSCULAR | Status: AC | PRN
  Administered 2013-01-08: 100 mL via INTRAVENOUS

## 2013-01-08 MED ORDER — ALUM & MAG HYDROXIDE-SIMETH 200-200-20 MG/5ML PO SUSP: 30.0000 mL | Freq: Four times a day (QID) | ORAL | Status: DC | PRN

## 2013-01-08 MED ORDER — MORPHINE SULFATE 2 MG/ML IJ SOLN
2.0000 mg | INTRAMUSCULAR | Status: DC | PRN
  Administered 2013-01-08 (×2): 2 mg via INTRAVENOUS
  Filled 2013-01-08 (×2): qty 1

## 2013-01-08 MED ORDER — LISINOPRIL 10 MG PO TABS
10.0000 mg | ORAL_TABLET | Freq: Every day | ORAL | Status: DC
  Administered 2013-01-08: 10 mg via ORAL
  Filled 2013-01-08: qty 1

## 2013-01-08 MED ORDER — HYDROMORPHONE HCL PF 1 MG/ML IJ SOLN
1.0000 mg | Freq: Once | INTRAMUSCULAR | Status: AC
  Administered 2013-01-08: 1 mg via INTRAVENOUS
  Filled 2013-01-08: qty 1

## 2013-01-08 MED ORDER — HYDRALAZINE HCL 20 MG/ML IJ SOLN
10.0000 mg | Freq: Four times a day (QID) | INTRAMUSCULAR | Status: DC | PRN
  Administered 2013-01-08 (×2): 10 mg via INTRAVENOUS
  Filled 2013-01-08 (×2): qty 1

## 2013-01-08 MED ORDER — SODIUM CHLORIDE 0.9 % IV SOLN
INTRAVENOUS | Status: DC
  Administered 2013-01-08: 14:00:00 via INTRAVENOUS

## 2013-01-08 MED ORDER — PEG 3350-KCL-NA BICARB-NACL 420 G PO SOLR
4000.0000 mL | Freq: Once | ORAL | Status: AC
  Administered 2013-01-08: 4000 mL via ORAL
  Filled 2013-01-08: qty 4000

## 2013-01-08 MED ORDER — ZOLPIDEM TARTRATE 5 MG PO TABS: 5.0000 mg | ORAL_TABLET | Freq: Every evening | ORAL | Status: DC | PRN

## 2013-01-08 MED ORDER — METRONIDAZOLE IN NACL 5-0.79 MG/ML-% IV SOLN: 500.0000 mg | Freq: Once | INTRAVENOUS | Status: DC

## 2013-01-08 MED ORDER — FAMOTIDINE 20 MG PO TABS
20.0000 mg | ORAL_TABLET | Freq: Every day | ORAL | Status: DC
  Administered 2013-01-08 – 2013-01-10 (×3): 20 mg via ORAL
  Filled 2013-01-08 (×3): qty 1

## 2013-01-08 MED ORDER — ACETAMINOPHEN 325 MG PO TABS
650.0000 mg | ORAL_TABLET | Freq: Four times a day (QID) | ORAL | Status: DC | PRN
  Administered 2013-01-09 – 2013-01-10 (×2): 650 mg via ORAL
  Filled 2013-01-08 (×2): qty 2

## 2013-01-08 MED ORDER — POTASSIUM CHLORIDE IN NACL 20-0.9 MEQ/L-% IV SOLN
INTRAVENOUS | Status: DC
  Administered 2013-01-08: 16:00:00 via INTRAVENOUS
  Filled 2013-01-08 (×2): qty 1000

## 2013-01-08 MED ORDER — CIPROFLOXACIN IN D5W 400 MG/200ML IV SOLN
400.0000 mg | Freq: Once | INTRAVENOUS | Status: AC
  Administered 2013-01-08: 400 mg via INTRAVENOUS
  Filled 2013-01-08: qty 200

## 2013-01-08 MED ORDER — LISINOPRIL 20 MG PO TABS
20.0000 mg | ORAL_TABLET | Freq: Every day | ORAL | Status: DC
  Administered 2013-01-09 – 2013-01-10 (×2): 20 mg via ORAL
  Filled 2013-01-08 (×2): qty 1

## 2013-01-08 MED ORDER — ONDANSETRON HCL 4 MG/2ML IJ SOLN
4.0000 mg | Freq: Four times a day (QID) | INTRAMUSCULAR | Status: DC | PRN
  Administered 2013-01-08 – 2013-01-09 (×2): 4 mg via INTRAVENOUS
  Filled 2013-01-08: qty 2

## 2013-01-08 NOTE — ED Provider Notes (Signed)
CSN: [id number]     Arrival date & time 01/08/13  8341 History   First MD Initiated Contact with Patient 01/08/13 0735     Chief Complaint  Patient presents with  . Abdominal Pain   (Consider location/radiation/quality/duration/timing/severity/associated sxs/prior Treatment) HPI Comments: Patient reports last week she developed some postnasal drip and associated generalized sore throat with slight change in voice which he reports is normal for her monthly associated with her coming on menstrual cycle. Her last cycle was about 3-1/2 weeks ago. She reports that she developed some generalized abdominal crampy pain, again similar to what she is used to starting last Wednesday or Thursday. However on Thursday she began having some mild loose stools again related to her menstrual cycles are so she sought. However by Friday and certainly on Saturday this progressed to a much more frequent and intense abdominal cramps associated with mixed with watery diarrhea. There is no blood or mucous. She began taking some Imodium right ear and subsequent volume of diarrhea has diminished. However the diffuse abdominal pain which has since improved has been replaced by constant right side and diffuse flank pain and tenderness. No nausea or vomiting. Appetite has somewhat diminished. No fever or chills. She reports her only abdominal surgery was a tubal ligation about 20 years ago. She reports she is sexually active, no GYN symptoms currently. She denies hematuria, dysuria. She has a history of kidney stones in the past but this pain does not feel like kidney stone pain. She reports minimal cough, no fever or chills. She denies smoking or drinking. She reports no recent antibiotic use, no foreign travel, no obvious sick contacts.  Patient is a 47 y.o. female presenting with abdominal pain. The history is provided by the patient.  Abdominal Pain Associated symptoms: cough, diarrhea and sore throat   Associated symptoms: no  chest pain, no chills, no constipation, no fever, no nausea, no shortness of breath, no vaginal bleeding, no vaginal discharge and no vomiting     Past Medical History  Diagnosis Date  . Hypertension   . Scoliosis    Past Surgical History  Procedure Laterality Date  . Hernia repair    . Back surgery    . Tubal ligation     History reviewed. No pertinent family history. History  Substance Use Topics  . Smoking status: Never Smoker   . Smokeless tobacco: Not on file  . Alcohol Use: No   OB History   Grav Para Term Preterm Abortions TAB SAB Ect Mult Living                 Review of Systems  Constitutional: Positive for appetite change. Negative for fever and chills.  HENT: Positive for postnasal drip, sore throat and voice change.   Respiratory: Positive for cough. Negative for shortness of breath and wheezing.   Cardiovascular: Negative for chest pain.  Gastrointestinal: Positive for abdominal pain and diarrhea. Negative for nausea, vomiting, constipation and blood in stool.  Genitourinary: Negative for vaginal bleeding and vaginal discharge.  Musculoskeletal: Negative for back pain.  Skin: Negative for rash and wound.  All other systems reviewed and are negative.    Allergies  Other and Sulfur  Home Medications   Current Outpatient Rx  Name  Route  Sig  Dispense  Refill  . ibuprofen (ADVIL,MOTRIN) 200 MG tablet   Oral   Take 400 mg by mouth every 6 (six) hours as needed.         Marland Kitchen lisinopril (  PRINIVIL,ZESTRIL) 10 MG tablet   Oral   Take 10 mg by mouth daily.          BP 164/97  Pulse 64  Temp(Src) 98.2 F (36.8 C) (Oral)  Resp 18  SpO2 98% Physical Exam  Nursing note and vitals reviewed. Constitutional: She is oriented to person, place, and time. She appears well-developed and well-nourished. No distress.  HENT:  Head: Normocephalic and atraumatic.  Mouth/Throat: Uvula is midline. Mucous membranes are not dry. Posterior oropharyngeal erythema  present. No oropharyngeal exudate, posterior oropharyngeal edema or tonsillar abscesses.  Eyes: Conjunctivae and EOM are normal. No scleral icterus.  Neck: Normal range of motion. Neck supple.  Cardiovascular: Normal rate, regular rhythm and intact distal pulses.   Pulmonary/Chest: Effort normal and breath sounds normal. No respiratory distress. She has no wheezes. She has no rales.  Abdominal: Soft. Normal appearance and bowel sounds are normal. She exhibits no distension. There is no hepatomegaly. There is tenderness in the right upper quadrant and right lower quadrant. There is guarding, CVA tenderness and positive Murphy's sign. There is no rigidity, no rebound and no tenderness at McBurney's point.    Neurological: She is alert and oriented to person, place, and time.  Skin: Skin is warm and dry. No rash noted. She is not diaphoretic.  Psychiatric: She has a normal mood and affect.    ED Course  Procedures (including critical care time) Labs Review Labs Reviewed  CBC WITH DIFFERENTIAL - Abnormal; Notable for the following:    Hemoglobin 10.9 (*)    HCT 34.2 (*)    MCV 76.9 (*)    MCH 24.5 (*)    RDW 16.4 (*)    All other components within normal limits  COMPREHENSIVE METABOLIC PANEL - Abnormal; Notable for the following:    Glucose, Bld 101 (*)    Albumin 3.3 (*)    GFR calc non Af Amer 77 (*)    GFR calc Af Amer 89 (*)    All other components within normal limits  URINALYSIS, ROUTINE W REFLEX MICROSCOPIC - Abnormal; Notable for the following:    Hgb urine dipstick TRACE (*)    All other components within normal limits  PREGNANCY, URINE  LIPASE, BLOOD  URINE MICROSCOPIC-ADD ON   Imaging Review Ct Abdomen Pelvis W Contrast  01/08/2013   CLINICAL DATA:  Abdominal pain, right flank pain, diarrhea and chills. Blood in stool.  EXAM: CT ABDOMEN AND PELVIS WITH CONTRAST  TECHNIQUE: Multidetector CT imaging of the abdomen and pelvis was performed using the standard protocol  following bolus administration of intravenous contrast.  CONTRAST:  166m OMNIPAQUE IOHEXOL 300 MG/ML  SOLN  COMPARISON:  04/10/2009  FINDINGS: Inflammatory process is identified involving the terminal ileum and proximal colon with diffuse submucosal fatty infiltration in the terminal ileum, cecum and ascending colon noted up to the level of the hepatic flexure. Findings are nearly pathognomonic for inflammatory bowel disease and particularly Crohn's disease. Associated inflammatory change in the pericolonic fat noted as well as multiple prominent right lower quadrant mesenteric nodes just inferior to the cecum. No associated perforation, abscess or bowel obstruction is identified. No fistulas are seen by CT. Correlation with colonoscopy may be helpful and Gastroenterology referral is recommended.  The rest of the bowel is unremarkable. Stable calcified gallstones identified. The liver, pancreas, spleen and adrenal glands are within normal limits. There is stable scarring noted involving both kidneys without hydronephrosis or renal calculi. Left adnexal cyst identified measuring approximately 2.7 x 3.4  cm. The bladder is unremarkable. No hernias are identified. Scoliosis and spinal fusion rod identified.  IMPRESSION: Findings consistent with inflammatory bowel disease with acute inflammation noted involving the terminal ileum and proximal colon as well as diffuse and prominent submucosal fatty infiltration of the terminal ileum and proximal colon. Findings are nearly pathognomonic for Crohn's disease. Infectious colitis is also a possibility. Associated prominent right lower quadrant mesenteric lymph nodes are seen. Recommend Gastroenterology referral. No associated abscess or bowel perforation identified.   Electronically Signed   By: Aletta Edouard M.D.   On: 01/08/2013 10:45    EKG Interpretation   None      RA sat is 99% and I interpret to be normal   11:58 AM Pt's pain is improved, discussed with  Dr. Sloan Leiter who agrees to admit, asks for IV cipro and flagyl in case this is acute infectious colitis rather than IBD.        MDM   1. Colitis      Pt with diffuse right side abd pain associated with diarrhea.  No fever.  Pt is hypertensive, possibly due to undiagnosed HTN, also associated with current pain and not feelign well.  Will montior.  Plan to treat pain, obtain CT of abd/pelvis, labs.      Saddie Benders. Adya Wirz, MD 01/08/13 1159

## 2013-01-08 NOTE — Consult Note (Addendum)
[name]  Reason for Consult: Right sided colitis and Ileitis Referring Physician: [facility]  Kristen [name] HPI: This is a 47 year old female with a PMH of scoliosis, HTN, tubal ligation, and back surgery who is admitted for complaints of abdominal pain.  The pain started last Friday and it was on the right side of her abdomen.  The pain progressively worsened and she rates it as a 10/10 at its peak.  It was initially remedied with ibuprofen, which helped with the generalized pain.  She still had right sided pain.  Diarrhea was a problem for her, but not hematochezia with her bowel movements.  She had blood on the toilet tissue from excessive wiping.  No sick contacts and there is no family history of IBD.  A CT scan of the abdomen revealed a terminal ileitis and right sided colitis.  As a result of the findings a GI consultation was requested.  Past Medical History  Diagnosis Date  . Hypertension   . Scoliosis     Past Surgical History  Procedure Laterality Date  . Hernia repair    . Back surgery    . Tubal ligation    . Tonsillectomy      History reviewed. No pertinent family history.  Social History:  reports that she has never smoked. She has never used smokeless tobacco. She reports that she does not drink alcohol or use illicit drugs.  Allergies:  Allergies  Allergen Reactions  . Other Hives and Swelling    tomato  . Sulfur Hives and Swelling    Medications:  Scheduled: . ciprofloxacin  400 mg Intravenous Q12H  . enoxaparin (LOVENOX) injection  30 mg Subcutaneous Q24H  . famotidine  20 mg Oral Daily  . [START ON 01/09/2013] lisinopril  20 mg Oral Daily  . metronidazole  500 mg Intravenous Once  . metronidazole  500 mg Intravenous Q8H   Continuous: . sodium chloride 0.45 % 1,000 mL with potassium chloride 20 mEq infusion 100 mL/hr at 01/08/13 1758    Results for orders placed during the hospital encounter of 01/08/13 (from the past 24 hour(s))   CBC WITH DIFFERENTIAL     Status: Abnormal   Collection Time    01/08/13  8:05 AM      Result Value Range   WBC 7.7  4.0 - 10.5 K/uL   RBC 4.45  3.87 - 5.11 MIL/uL   Hemoglobin 10.9 (*) 12.0 - 15.0 g/dL   HCT 34.2 (*) 36.0 - 46.0 %   MCV 76.9 (*) 78.0 - 100.0 fL   MCH 24.5 (*) 26.0 - 34.0 pg   MCHC 31.9  30.0 - 36.0 g/dL   RDW 16.4 (*) 11.5 - 15.5 %   Platelets 271  150 - 400 K/uL   Neutrophils Relative % 68  43 - 77 %   Lymphocytes Relative 26  12 - 46 %   Monocytes Relative 6  3 - 12 %   Eosinophils Relative 0  0 - 5 %   Basophils Relative 0  0 - 1 %   Band Neutrophils 0  0 - 10 %   Metamyelocytes Relative 0     Myelocytes 0     Promyelocytes Absolute 0     Blasts 0     nRBC 0  0 /100 WBC   Neutro Abs 5.2  1.7 - 7.7 K/uL   Lymphs Abs 2.0  0.7 - 4.0 K/uL   Monocytes Absolute 0.5  0.1 -  1.0 K/uL   Eosinophils Absolute 0.0  0.0 - 0.7 K/uL   Basophils Absolute 0.0  0.0 - 0.1 K/uL   RBC Morphology ELLIPTOCYTES     WBC Morphology ATYPICAL LYMPHOCYTES    COMPREHENSIVE METABOLIC PANEL     Status: Abnormal   Collection Time    01/08/13  8:05 AM      Result Value Range   Sodium 141  135 - 145 mEq/L   Potassium 3.6  3.5 - 5.1 mEq/L   Chloride 106  96 - 112 mEq/L   CO2 24  19 - 32 mEq/L   Glucose, Bld 101 (*) 70 - 99 mg/dL   BUN 10  6 - 23 mg/dL   Creatinine, Ser 0.88  0.50 - 1.10 mg/dL   Calcium 8.4  8.4 - 10.5 mg/dL   Total Protein 7.0  6.0 - 8.3 g/dL   Albumin 3.3 (*) 3.5 - 5.2 g/dL   AST 17  0 - 37 U/L   ALT 10  0 - 35 U/L   Alkaline Phosphatase 87  39 - 117 U/L   Total Bilirubin 0.4  0.3 - 1.2 mg/dL   GFR calc non Af Amer 77 (*) >90 mL/min   GFR calc Af Amer 89 (*) >90 mL/min  LIPASE, BLOOD     Status: None   Collection Time    01/08/13  8:05 AM      Result Value Range   Lipase 19  11 - 59 U/L  URINALYSIS, ROUTINE W REFLEX MICROSCOPIC     Status: Abnormal   Collection Time    01/08/13  9:12 AM      Result Value Range   Color, Urine YELLOW  YELLOW   APPearance  CLEAR  CLEAR   Specific Gravity, Urine 1.015  1.005 - 1.030   pH 7.5  5.0 - 8.0   Glucose, UA NEGATIVE  NEGATIVE mg/dL   Hgb urine dipstick TRACE (*) NEGATIVE   Bilirubin Urine NEGATIVE  NEGATIVE   Ketones, ur NEGATIVE  NEGATIVE mg/dL   Protein, ur NEGATIVE  NEGATIVE mg/dL   Urobilinogen, UA 1.0  0.0 - 1.0 mg/dL   Nitrite NEGATIVE  NEGATIVE   Leukocytes, UA NEGATIVE  NEGATIVE  PREGNANCY, URINE     Status: None   Collection Time    01/08/13  9:12 AM      Result Value Range   Preg Test, Ur NEGATIVE  NEGATIVE  URINE MICROSCOPIC-ADD ON     Status: None   Collection Time    01/08/13  9:12 AM      Result Value Range   Squamous Epithelial / LPF RARE  RARE   WBC, UA 0-2  <3 WBC/hpf   RBC / HPF 0-2  <3 RBC/hpf  CBC     Status: Abnormal   Collection Time    01/08/13  2:41 PM      Result Value Range   WBC 10.0  4.0 - 10.5 K/uL   RBC 4.64  3.87 - 5.11 MIL/uL   Hemoglobin 11.3 (*) 12.0 - 15.0 g/dL   HCT 36.0  36.0 - 46.0 %   MCV 77.6 (*) 78.0 - 100.0 fL   MCH 24.4 (*) 26.0 - 34.0 pg   MCHC 31.4  30.0 - 36.0 g/dL   RDW 16.4 (*) 11.5 - 15.5 %   Platelets 237  150 - 400 K/uL  CREATININE, SERUM     Status: Abnormal   Collection Time    01/08/13  2:41 PM  Result Value Range   Creatinine, Ser 0.81  0.50 - 1.10 mg/dL   GFR calc non Af Amer 85 (*) >90 mL/min   GFR calc Af Amer >90  >90 mL/min     Ct Abdomen Pelvis W Contrast  01/08/2013   CLINICAL DATA:  Abdominal pain, right flank pain, diarrhea and chills. Blood in stool.  EXAM: CT ABDOMEN AND PELVIS WITH CONTRAST  TECHNIQUE: Multidetector CT imaging of the abdomen and pelvis was performed using the standard protocol following bolus administration of intravenous contrast.  CONTRAST:  172m OMNIPAQUE IOHEXOL 300 MG/ML  SOLN  COMPARISON:  04/10/2009  FINDINGS: Inflammatory process is identified involving the terminal ileum and proximal colon with diffuse submucosal fatty infiltration in the terminal ileum, cecum and ascending colon  noted up to the level of the hepatic flexure. Findings are nearly pathognomonic for inflammatory bowel disease and particularly Crohn's disease. Associated inflammatory change in the pericolonic fat noted as well as multiple prominent right lower quadrant mesenteric nodes just inferior to the cecum. No associated perforation, abscess or bowel obstruction is identified. No fistulas are seen by CT. Correlation with colonoscopy may be helpful and Gastroenterology referral is recommended.  The rest of the bowel is unremarkable. Stable calcified gallstones identified. The liver, pancreas, spleen and adrenal glands are within normal limits. There is stable scarring noted involving both kidneys without hydronephrosis or renal calculi. Left adnexal cyst identified measuring approximately 2.7 x 3.4 cm. The bladder is unremarkable. No hernias are identified. Scoliosis and spinal fusion rod identified.  IMPRESSION: Findings consistent with inflammatory bowel disease with acute inflammation noted involving the terminal ileum and proximal colon as well as diffuse and prominent submucosal fatty infiltration of the terminal ileum and proximal colon. Findings are nearly pathognomonic for Crohn's disease. Infectious colitis is also a possibility. Associated prominent right lower quadrant mesenteric lymph nodes are seen. Recommend Gastroenterology referral. No associated abscess or bowel perforation identified.   Electronically Signed   By: GAletta EdouardM.D.   On: 01/08/2013 10:45    ROS:  As stated above in the HPI otherwise negative.  Blood pressure 165/110, pulse 75, temperature 98.5 F (36.9 C), temperature source Oral, resp. rate 20, height 5' 9"  (1.753 m), weight 259 lb 14.4 oz (117.89 kg), SpO2 96.00%.    PE: Gen: NAD, Alert and Oriented HEENT:  [place]/AT, EOMI Neck: Supple, no LAD Lungs: CTA Bilaterally CV: RRR without M/G/R ABM: Soft, tender on the right side, +BS Ext: No C/C/E  Assessment/Plan: 1) Terminal  ileitis and right sided colitis. 2) Anemia.   Her findings are consistent with Crohn's disease.  Further evaluation with a colonoscopy is required.  Plan: 1) Colonoscopy tomorrow.  Keithon Mccoin D 01/08/2013, 6:52 PM

## 2013-01-08 NOTE — Progress Notes (Signed)
Pt vomited with streaks of blood. Vomitus greenish in color- 213m. Pt c/o headache. Given pain medication. Will continue to monitor. Will contact [facility].

## 2013-01-08 NOTE — ED Notes (Signed)
Patient transported to CT 

## 2013-01-08 NOTE — ED Notes (Signed)
Pt states that she started having right lateral abdominal and flank area pain since, diarrhea on Thursday.  Diarrhea stopped on Saturday.  No chest pain or sob

## 2013-01-08 NOTE — Progress Notes (Signed)
Bp is 162/131mHg, given PRN hydralazine. Will re-check Bp after an hr. Will continue to monitor.

## 2013-01-08 NOTE — Progress Notes (Signed)
Re-check pt Bp 153/35mHg.

## 2013-01-08 NOTE — Progress Notes (Signed)
Received report from ED

## 2013-01-08 NOTE — ED Notes (Signed)
Report given to Santiago Glad.

## 2013-01-08 NOTE — H&P (Addendum)
PATIENT DETAILS Name: Kristen [name] Age: 47 y.o. Sex: female Date of Birth: 03-29-1965 Admit Date: 01/08/2013 GTX:MIWOEHO,ZYYQM A, MD   CHIEF COMPLAINT:  Right-sided abdominal pain since last Friday  HPI: Kristen [name] is a 47 y.o. female with a Past Medical History of hypertension, chronic microcytic anemia who presents today with the above noted complaint. The patient, she was in her usual state of health in last Friday when she started developing abdominal pain on the right side of the abdomen. Patient describes the abdominal pain as crampy, 10/10 at its worst, no radiation. She has associated nausea, but no vomiting. She does give a history of having diarrhea with minimal blood in the stools. She has some subjective fever with chills. Initially, she claims that the pain is like her usual menstrual cramps, however was persistent and worsened, as a result she presented to the ED today. CT scan of the abdomen showed terminal ileitis and proximal colitis. The hospitalist service was asked to admit this patient for further evaluation and treatment Patient claims that she's never had similar symptoms like this in the past, she has never had a colonoscopy, however she claims that she saw a GI M.D. many years ago for GERD-like symptoms and had an endoscopy.   ALLERGIES:   Allergies  Allergen Reactions  . Other Hives and Swelling    tomato  . Sulfur Hives and Swelling    PAST MEDICAL HISTORY: Past Medical History  Diagnosis Date  . Hypertension   . Scoliosis     PAST SURGICAL HISTORY: Past Surgical History  Procedure Laterality Date  . Hernia repair    . Back surgery    . Tubal ligation      MEDICATIONS AT HOME: Prior to Admission medications   Medication Sig Start Date End Date Taking? Authorizing Provider  ibuprofen (ADVIL,MOTRIN) 200 MG tablet Take 400 mg by mouth every 6 (six) hours as needed.   Yes Historical Provider, MD  lisinopril (PRINIVIL,ZESTRIL) 10 MG  tablet Take 10 mg by mouth daily.   Yes Historical Provider, MD    FAMILY HISTORY: Father-MI No family history of inflammation bowel disease  SOCIAL HISTORY:  reports that she has never smoked. She does not have any smokeless tobacco history on file. She reports that she does not drink alcohol or use illicit drugs.  REVIEW OF SYSTEMS:  Constitutional:   No  weight loss, night sweats,   fatigue.  HEENT:    No headaches, Difficulty swallowing,Tooth/dental problems,Sore throat,  No sneezing, itching, ear ache, nasal congestion, post nasal drip,   Cardio-vascular: No chest pain,  Orthopnea, PND, swelling in lower extremities, anasarca, dizziness, palpitations  GI:  No heartburn, indigestion,vomiting,  loss of appetite  Resp: No shortness of breath with exertion or at rest.  No excess mucus, no productive cough, No non-productive cough,  No coughing up of blood.No change in color of mucus.No wheezing.No chest wall deformity  Skin:  no rash or lesions.  GU:  no dysuria, change in color of urine, no urgency or frequency.  No flank pain.  Musculoskeletal: No joint pain or swelling.  No decreased range of motion.  No back pain.  Psych: No change in mood or affect. No depression or anxiety.  No memory loss.   PHYSICAL EXAM: Blood pressure 164/97, pulse 64, temperature 98.2 F (36.8 C), temperature source Oral, resp. rate 18, SpO2 98.00%.  General appearance :Awake, alert, not in any distress. Speech Clear. Not toxic Looking HEENT: Atraumatic and Normocephalic, pupils  equally reactive to light and accomodation Neck: supple, no JVD. No cervical lymphadenopathy.  Chest:Good air entry bilaterally, no added sounds  CVS: S1 S2 regular, no murmurs.  Abdomen: Bowel sounds present, moderately tender in the right lower quadrant, without gaurding, rigidity or rebound. Extremities: B/L Lower Ext shows no edema, both legs are warm to touch Neurology: Awake alert, and oriented X 3, CN  II-XII intact, Non focal Skin:No Rash Wounds:N/A  LABS ON ADMISSION:   Recent Labs  01/08/13 0805  NA 141  K 3.6  CL 106  CO2 24  GLUCOSE 101*  BUN 10  CREATININE 0.88  CALCIUM 8.4    Recent Labs  01/08/13 0805  AST 17  ALT 10  ALKPHOS 87  BILITOT 0.4  PROT 7.0  ALBUMIN 3.3*    Recent Labs  01/08/13 0805  LIPASE 19    Recent Labs  01/08/13 0805  WBC 7.7  NEUTROABS 5.2  HGB 10.9*  HCT 34.2*  MCV 76.9*  PLT 271   No results found for this basename: CKTOTAL, CKMB, CKMBINDEX, TROPONINI,  in the last 72 hours No results found for this basename: DDIMER,  in the last 72 hours No components found with this basename: POCBNP,    RADIOLOGIC STUDIES ON ADMISSION: Ct Abdomen Pelvis W Contrast  01/08/2013   CLINICAL DATA:  Abdominal pain, right flank pain, diarrhea and chills. Blood in stool.  EXAM: CT ABDOMEN AND PELVIS WITH CONTRAST  TECHNIQUE: Multidetector CT imaging of the abdomen and pelvis was performed using the standard protocol following bolus administration of intravenous contrast.  CONTRAST:  135m OMNIPAQUE IOHEXOL 300 MG/ML  SOLN  COMPARISON:  04/10/2009  FINDINGS: Inflammatory process is identified involving the terminal ileum and proximal colon with diffuse submucosal fatty infiltration in the terminal ileum, cecum and ascending colon noted up to the level of the hepatic flexure. Findings are nearly pathognomonic for inflammatory bowel disease and particularly Crohn's disease. Associated inflammatory change in the pericolonic fat noted as well as multiple prominent right lower quadrant mesenteric nodes just inferior to the cecum. No associated perforation, abscess or bowel obstruction is identified. No fistulas are seen by CT. Correlation with colonoscopy may be helpful and Gastroenterology referral is recommended.  The rest of the bowel is unremarkable. Stable calcified gallstones identified. The liver, pancreas, spleen and adrenal glands are within normal  limits. There is stable scarring noted involving both kidneys without hydronephrosis or renal calculi. Left adnexal cyst identified measuring approximately 2.7 x 3.4 cm. The bladder is unremarkable. No hernias are identified. Scoliosis and spinal fusion rod identified.  IMPRESSION: Findings consistent with inflammatory bowel disease with acute inflammation noted involving the terminal ileum and proximal colon as well as diffuse and prominent submucosal fatty infiltration of the terminal ileum and proximal colon. Findings are nearly pathognomonic for Crohn's disease. Infectious colitis is also a possibility. Associated prominent right lower quadrant mesenteric lymph nodes are seen. Recommend Gastroenterology referral. No associated abscess or bowel perforation identified.   Electronically Signed   By: GAletta EdouardM.D.   On: 01/08/2013 10:45    ASSESSMENT AND PLAN: Present on Admission:  . Terminal ileitis/Colitis - Given a presentation, and this being her first episode, suspect infectious etiology, however her CT scan of the abdomen suggests inflammatory etiology. For now we will admit, keep her n.p.o., placed on empiric Cipro and Flagyl. - We will consult GI for further evaluation and management. - Hold off on starting anti-inflammatories for now.  . Anemia - Chronic issue- suspect  secondary to menstrual loss - Monitor hemoglobin and hematocrit periodically   . HTN (hypertension) - Continue lisinopril, BP appears to be on the higher side. We will monitor BP closely and adjust medications as needed.   . Morbid obesity - Counseled regarding weight loss  Further plan will depend as patient's clinical course evolves and further radiologic and laboratory data become available. Patient will be monitored closely.  Above noted plan was discussed with patient, she was in agreement.   DVT Prophylaxis: Prophylactic Lovenox   Code Status: Full Code  Total time spent for admission equals 45  minutes.  [facility] Pager [phone]  If 7PM-7AM, please contact night-coverage [email or web address] Password [facility] 01/08/2013, 12:31 PM

## 2013-01-08 NOTE — Progress Notes (Signed)
Kristen [name] [id number] Code Status: full   Admission Data: 01/08/2013 1:48 PM Attending Provider:  ghimire NPY:YFRTMYT,RZNBV A, MD Consults/ Treatment Team:    Kristen [name] is a 47 y.o. female patient admitted from ED awake, alert - oriented  X 3 - no acute distress noted.  VSS - Blood pressure 164/97, pulse 64, temperature 98.2 F (36.8 C), temperature source Oral, resp. rate 18, SpO2 98.00%.  no c/o shortness of breath, no c/o chest pain. in place,   IV Fluids:  IV in place, occlusive dsg intact without redness, IV cath antecubital right, condition patent and no redness normal saline.  Allergies:   Allergies  Allergen Reactions  . Other Hives and Swelling    tomato  . Sulfur Hives and Swelling     Past Medical History  Diagnosis Date  . Hypertension   . Scoliosis    Medications Prior to Admission  Medication Sig Dispense Refill  . ibuprofen (ADVIL,MOTRIN) 200 MG tablet Take 400 mg by mouth every 6 (six) hours as needed.      Marland Kitchen lisinopril (PRINIVIL,ZESTRIL) 10 MG tablet Take 10 mg by mouth daily.       History:  obtained from the patient. Tobacco/alcohol: denied none  Orientation to room, and floor completed with information packet given to patient/family.  Patient declined safety video at this time.  Admission INP armband ID verified with patient/family, and in place.   SR up x 2, fall assessment complete, with patient and family able to verbalize understanding of risk associated with falls, and verbalized understanding to call nsg before up out of bed.  Call light within reach, patient able to voice, and demonstrate understanding.  Skin, clean-dry- intact without evidence of bruising, or skin tears.   No evidence of skin break down noted on exam.     Will cont to eval and treat per MD orders.  Kristen Bruins, RN 01/08/2013 1:48 PM

## 2013-01-09 ENCOUNTER — Encounter (HOSPITAL_COMMUNITY)
Admission: EM | Disposition: A | Discharge status: Home / Self Care | Payer: Self-pay | Source: Home / Self Care | Attending: Internal Medicine

## 2013-01-09 ENCOUNTER — Encounter (HOSPITAL_COMMUNITY): Payer: Self-pay | Admitting: Gastroenterology

## 2013-01-09 HISTORY — PX: COLONOSCOPY: SHX5424

## 2013-01-09 LAB — CLOSTRIDIUM DIFFICILE BY PCR: Toxigenic C. Difficile by PCR: NEGATIVE

## 2013-01-09 LAB — COMPREHENSIVE METABOLIC PANEL
ALT: 13 U/L (ref 0–35)
AST: 19 U/L (ref 0–37)
Alkaline Phosphatase: 90 U/L (ref 39–117)
CO2: 24 mEq/L (ref 19–32)
Calcium: 8.8 mg/dL (ref 8.4–10.5)
Chloride: 104 mEq/L (ref 96–112)
GFR calc Af Amer: 89 mL/min — ABNORMAL LOW (ref >90)
GFR calc non Af Amer: 77 mL/min — ABNORMAL LOW (ref >90)
Glucose, Bld: 101 mg/dL — ABNORMAL HIGH (ref 70–99)
Sodium: 141 mEq/L (ref 135–145)
Total Bilirubin: 0.6 mg/dL (ref 0.3–1.2)

## 2013-01-09 LAB — CBC
Hemoglobin: 11.3 g/dL — ABNORMAL LOW (ref 12.0–15.0)
MCH: 24.4 pg — ABNORMAL LOW (ref 26.0–34.0)
MCHC: 31.5 g/dL (ref 30.0–36.0)
Platelets: 260 10*3/uL (ref 150–400)
RDW: 16.6 % — ABNORMAL HIGH (ref 11.5–15.5)

## 2013-01-09 SURGERY — COLONOSCOPY
Anesthesia: Moderate Sedation

## 2013-01-09 MED ORDER — PROMETHAZINE HCL 25 MG/ML IJ SOLN
12.5000 mg | Freq: Four times a day (QID) | INTRAMUSCULAR | Status: DC | PRN
  Administered 2013-01-09 (×2): 12.5 mg via INTRAVENOUS
  Filled 2013-01-09 (×2): qty 1

## 2013-01-09 MED ORDER — ENOXAPARIN SODIUM 40 MG/0.4ML ~~LOC~~ SOLN
40.0000 mg | SUBCUTANEOUS | Status: DC
  Filled 2013-01-09 (×2): qty 0.4

## 2013-01-09 MED ORDER — FENTANYL CITRATE 0.05 MG/ML IJ SOLN
INTRAMUSCULAR | Status: DC | PRN
  Administered 2013-01-09 (×4): 25 ug via INTRAVENOUS

## 2013-01-09 MED ORDER — MIDAZOLAM HCL 5 MG/ML IJ SOLN
INTRAMUSCULAR | Status: AC
  Filled 2013-01-09: qty 2

## 2013-01-09 MED ORDER — MIDAZOLAM HCL 5 MG/5ML IJ SOLN
INTRAMUSCULAR | Status: DC | PRN
  Administered 2013-01-09 (×4): 2 mg via INTRAVENOUS

## 2013-01-09 MED ORDER — FENTANYL CITRATE 0.05 MG/ML IJ SOLN
INTRAMUSCULAR | Status: AC
  Filled 2013-01-09: qty 2

## 2013-01-09 MED ORDER — HYDRALAZINE HCL 25 MG PO TABS
25.0000 mg | ORAL_TABLET | Freq: Three times a day (TID) | ORAL | Status: DC
  Administered 2013-01-09 – 2013-01-10 (×4): 25 mg via ORAL
  Filled 2013-01-09 (×7): qty 1

## 2013-01-09 NOTE — Op Note (Signed)
[facility] [place], [id number]   OPERATIVE PROCEDURE REPORT  PATIENT: Kristen, [name]  MR#: [id number] BIRTHDATE: 02/02/1965  GENDER: Female ENDOSCOPIST: Carol Ada, MD ASSISTANT:   Sharon Mt, Endo Technician Corwin Levins, RN William Dalton, technician PROCEDURE DATE: 01/09/2013 PROCEDURE:   Colonoscopy with biopsies ASA CLASS:   Class II INDICATIONS:Abnormal CT scan MEDICATIONS: Versed 8 mg IV and Fentanyl 100 mcg IV  DESCRIPTION OF PROCEDURE:   After the risks benefits and alternatives of the procedure were thoroughly explained, informed consent was obtained.  A digital rectal exam revealed no abnormalities of the rectum.    The Pentax Adult Colon [id number] endoscope was introduced through the anus  and advanced to the terminal ileum which was intubated for a short distance , No adverse events experienced.    The quality of the prep was good. . The instrument was then slowly withdrawn as the colon was fully examined.     FINDINGS: A diffuse patchy colitis was identified throughout the entire colon.  It appeared that he proximal and distal colon exhibited more inflammation.  The Terminal Ileum was normal, but I was not able to intubate the TI deeply.  the examination was difficult because of patient tolerance and significant looping. Random cold biopsies of the colitis were obtained.  No other abnormalities noted, i.e., polyps or masses.  Scattered left sided diverticula were identified.   Retroflexed views revealed internal/external hemorrhoids.     The scope was then withdrawn from the patient and the procedure terminated.  COMPLICATIONS: There were no complications.  IMPRESSION: 1) Diffuse patchy colitis. 2) Left sided diverticula. 3) Int/Ext hemorrhoids.  RECOMMENDATIONS: 1) Await biopsy results. 2) Continue with antibiotics for now and hold on any steroids.  _______________________________ eSignedCarol Ada, MD 01/09/2013 6:36 PM

## 2013-01-09 NOTE — Progress Notes (Signed)
Pt morning Bp 147/123mHg with HR 50bpm. Contacted Amion. Awaiting for orders. Will continue to monitor.

## 2013-01-09 NOTE — Progress Notes (Addendum)
PATIENT DETAILS Name: Kristen [name] Age: 47 y.o. Sex: female Date of Birth: 12-21-1965 Admit Date: 01/08/2013 Admitting Physician Evalee Mutton Kristeen Mans, MD PYK:DXIPJAS,NKNLZ A, MD  Subjective: Abdominal pain significantly better.  Assessment/Plan: Terminal ileitis/Colitis  - Given acute presentation, and this being her first episode, suspect infectious etiology, however her CT scan of the abdomen suggests inflammatory etiology. She was admitted and started on empiric Cipro and Flagyl-day 2, she was kept n.p.o. GI was consulted, colonoscopy scheduled for 12/23. - Significant improvement with just antibiotics.  - Hold off on starting anti-inflammatories for now.  - stool studies pending   . Anemia  - Chronic issue- suspect secondary to menstrual loss  - Monitor hemoglobin and hematocrit periodically   . HTN (hypertension)  - Much better controlled today, have increased lisinopril to 20 mg.  BP appears to be on the higher side. We will monitor BP closely and adjust medications as needed.   . Morbid obesity  - Counseled regarding weight loss  Disposition: Remain inpatient  DVT Prophylaxis: Prophylactic Lovenox   Code Status: Full code   Family Communication None at bedside  Procedures:  Colonoscopy scheduled 12/23  CONSULTS:  Gastroenterology  Time spent 40 minutes-which includes 50% of the time with face-to-face with patient regarding the pathophysiology, disease process, and discharge planning  MEDICATIONS: Scheduled Meds: . ciprofloxacin  400 mg Intravenous Q12H  . enoxaparin (LOVENOX) injection  40 mg Subcutaneous Q24H  . famotidine  20 mg Oral Daily  . hydrALAZINE  25 mg Oral Q8H  . lisinopril  20 mg Oral Daily  . metronidazole  500 mg Intravenous Once  . metronidazole  500 mg Intravenous Q8H   Continuous Infusions: . sodium chloride 20 mL/hr at 01/08/13 2053  . sodium chloride 0.45 % 1,000 mL with potassium chloride 20 mEq infusion 100 mL/hr at  01/08/13 1758   PRN Meds:.acetaminophen, acetaminophen, alum & mag hydroxide-simeth, hydrALAZINE, ibuprofen, morphine injection, ondansetron (ZOFRAN) IV, ondansetron, oxyCODONE, promethazine, zolpidem  Antibiotics: Anti-infectives   Start     Dose/Rate Route Frequency Ordered Stop   01/08/13 1345  ciprofloxacin (CIPRO) IVPB 400 mg     400 mg 200 mL/hr over 60 Minutes Intravenous Every 12 hours 01/08/13 1344     01/08/13 1345  metroNIDAZOLE (FLAGYL) IVPB 500 mg     500 mg 100 mL/hr over 60 Minutes Intravenous Every 8 hours 01/08/13 1344     01/08/13 1200  ciprofloxacin (CIPRO) IVPB 400 mg     400 mg 200 mL/hr over 60 Minutes Intravenous  Once 01/08/13 1156 01/08/13 1328   01/08/13 1200  metroNIDAZOLE (FLAGYL) IVPB 500 mg     500 mg 100 mL/hr over 60 Minutes Intravenous  Once 01/08/13 1156         PHYSICAL EXAM: Vital signs in last 24 hours: Filed Vitals:   01/08/13 2300 01/09/13 0158 01/09/13 0541 01/09/13 0716  BP: 153/93 123/80 147/107 157/87  Pulse: 92  50   Temp:   98.5 F (36.9 C)   TempSrc:   Oral   Resp:   20   Height:      Weight:      SpO2:   100%     Weight change:  Filed Weights   01/08/13 1300  Weight: 117.89 kg (259 lb 14.4 oz)   Body mass index is 38.36 kg/(m^2).   Gen Exam: Awake and alert with clear speech.   Neck: Supple, No JVD.   Chest: B/L Clear.   CVS: S1 S2 Regular, no  murmurs.  Abdomen: soft, BS +, mildly tender RLQ, non distended.  Extremities: no edema, lower extremities warm to touch. Neurologic: Non Focal.   Skin: No Rash.   Wounds: N/A.    Intake/Output from previous day:  Intake/Output Summary (Last 24 hours) at 01/09/13 1218 Last data filed at 01/09/13 1044  Gross per 24 hour  Intake 2453.67 ml  Output    250 ml  Net 2203.67 ml     LAB RESULTS: CBC  Recent Labs Lab 01/08/13 0805 01/08/13 1441 01/09/13 0546  WBC 7.7 10.0 8.9  HGB 10.9* 11.3* 11.3*  HCT 34.2* 36.0 35.9*  PLT 271 237 260  MCV 76.9* 77.6* 77.5*   MCH 24.5* 24.4* 24.4*  MCHC 31.9 31.4 31.5  RDW 16.4* 16.4* 16.6*  LYMPHSABS 2.0  --   --   MONOABS 0.5  --   --   EOSABS 0.0  --   --   BASOSABS 0.0  --   --     Chemistries   Recent Labs Lab 01/08/13 0805 01/08/13 1441 01/09/13 0546  NA 141  --  141  K 3.6  --  3.5  CL 106  --  104  CO2 24  --  24  GLUCOSE 101*  --  101*  BUN 10  --  8  CREATININE 0.88 0.81 0.88  CALCIUM 8.4  --  8.8    CBG: No results found for this basename: GLUCAP,  in the last 168 hours  GFR Estimated Creatinine Clearance: 108.4 ml/min (by C-G formula based on Cr of 0.88).  Coagulation profile No results found for this basename: INR, PROTIME,  in the last 168 hours  Cardiac Enzymes No results found for this basename: CK, CKMB, TROPONINI, MYOGLOBIN,  in the last 168 hours  No components found with this basename: POCBNP,  No results found for this basename: DDIMER,  in the last 72 hours No results found for this basename: HGBA1C,  in the last 72 hours No results found for this basename: CHOL, HDL, LDLCALC, TRIG, CHOLHDL, LDLDIRECT,  in the last 72 hours No results found for this basename: TSH, T4TOTAL, FREET3, T3FREE, THYROIDAB,  in the last 72 hours No results found for this basename: VITAMINB12, FOLATE, FERRITIN, TIBC, IRON, RETICCTPCT,  in the last 72 hours  Recent Labs  01/08/13 0805  LIPASE 19    Urine Studies No results found for this basename: UACOL, UAPR, USPG, UPH, UTP, UGL, UKET, UBIL, UHGB, UNIT, UROB, ULEU, UEPI, UWBC, URBC, UBAC, CAST, CRYS, UCOM, BILUA,  in the last 72 hours  MICROBIOLOGY: No results found for this or any previous visit (from the past 240 hour(s)).  RADIOLOGY STUDIES/RESULTS: Ct Abdomen Pelvis W Contrast  01/08/2013   CLINICAL DATA:  Abdominal pain, right flank pain, diarrhea and chills. Blood in stool.  EXAM: CT ABDOMEN AND PELVIS WITH CONTRAST  TECHNIQUE: Multidetector CT imaging of the abdomen and pelvis was performed using the standard protocol  following bolus administration of intravenous contrast.  CONTRAST:  156m OMNIPAQUE IOHEXOL 300 MG/ML  SOLN  COMPARISON:  04/10/2009  FINDINGS: Inflammatory process is identified involving the terminal ileum and proximal colon with diffuse submucosal fatty infiltration in the terminal ileum, cecum and ascending colon noted up to the level of the hepatic flexure. Findings are nearly pathognomonic for inflammatory bowel disease and particularly Crohn's disease. Associated inflammatory change in the pericolonic fat noted as well as multiple prominent right lower quadrant mesenteric nodes just inferior to the cecum. No associated perforation, abscess  or bowel obstruction is identified. No fistulas are seen by CT. Correlation with colonoscopy may be helpful and Gastroenterology referral is recommended.  The rest of the bowel is unremarkable. Stable calcified gallstones identified. The liver, pancreas, spleen and adrenal glands are within normal limits. There is stable scarring noted involving both kidneys without hydronephrosis or renal calculi. Left adnexal cyst identified measuring approximately 2.7 x 3.4 cm. The bladder is unremarkable. No hernias are identified. Scoliosis and spinal fusion rod identified.  IMPRESSION: Findings consistent with inflammatory bowel disease with acute inflammation noted involving the terminal ileum and proximal colon as well as diffuse and prominent submucosal fatty infiltration of the terminal ileum and proximal colon. Findings are nearly pathognomonic for Crohn's disease. Infectious colitis is also a possibility. Associated prominent right lower quadrant mesenteric lymph nodes are seen. Recommend Gastroenterology referral. No associated abscess or bowel perforation identified.   Electronically Signed   By: Aletta Edouard M.D.   On: 01/08/2013 10:45    Oren Binet, MD  [facility] Pager:[phone]  If 7PM-7AM, please contact night-coverage [email or web address] Password  [facility] 01/09/2013, 12:18 PM   LOS: 1 day

## 2013-01-09 NOTE — Progress Notes (Signed)
Called endoscopy and updated them on pt drinking half of the prep with no results yet. Hope in endoscopy stated she would talk to Dr. Benson Norway when he arrives.

## 2013-01-09 NOTE — H&P (View-Only) (Signed)
PATIENT DETAILS Name: Kristen [name] Age: 47 y.o. Sex: female Date of Birth: December 08, 1965 Admit Date: 01/08/2013 Admitting Physician Evalee Mutton Kristeen Mans, MD SHF:WYOVZCH,YIFOY A, MD  Subjective: Abdominal pain significantly better.  Assessment/Plan: Terminal ileitis/Colitis  - Given acute presentation, and this being her first episode, suspect infectious etiology, however her CT scan of the abdomen suggests inflammatory etiology. She was admitted and started on empiric Cipro and Flagyl-day 2, she was kept n.p.o. GI was consulted, colonoscopy scheduled for 12/23. - Significant improvement with just antibiotics.  - Hold off on starting anti-inflammatories for now.  - stool studies pending   . Anemia  - Chronic issue- suspect secondary to menstrual loss  - Monitor hemoglobin and hematocrit periodically   . HTN (hypertension)  - Much better controlled today, have increased lisinopril to 20 mg.  BP appears to be on the higher side. We will monitor BP closely and adjust medications as needed.   . Morbid obesity  - Counseled regarding weight loss  Disposition: Remain inpatient  DVT Prophylaxis: Prophylactic Lovenox   Code Status: Full code   Family Communication None at bedside  Procedures:  Colonoscopy scheduled 12/23  CONSULTS:  Gastroenterology  Time spent 40 minutes-which includes 50% of the time with face-to-face with patient regarding the pathophysiology, disease process, and discharge planning  MEDICATIONS: Scheduled Meds: . ciprofloxacin  400 mg Intravenous Q12H  . enoxaparin (LOVENOX) injection  40 mg Subcutaneous Q24H  . famotidine  20 mg Oral Daily  . hydrALAZINE  25 mg Oral Q8H  . lisinopril  20 mg Oral Daily  . metronidazole  500 mg Intravenous Once  . metronidazole  500 mg Intravenous Q8H   Continuous Infusions: . sodium chloride 20 mL/hr at 01/08/13 2053  . sodium chloride 0.45 % 1,000 mL with potassium chloride 20 mEq infusion 100 mL/hr at  01/08/13 1758   PRN Meds:.acetaminophen, acetaminophen, alum & mag hydroxide-simeth, hydrALAZINE, ibuprofen, morphine injection, ondansetron (ZOFRAN) IV, ondansetron, oxyCODONE, promethazine, zolpidem  Antibiotics: Anti-infectives   Start     Dose/Rate Route Frequency Ordered Stop   01/08/13 1345  ciprofloxacin (CIPRO) IVPB 400 mg     400 mg 200 mL/hr over 60 Minutes Intravenous Every 12 hours 01/08/13 1344     01/08/13 1345  metroNIDAZOLE (FLAGYL) IVPB 500 mg     500 mg 100 mL/hr over 60 Minutes Intravenous Every 8 hours 01/08/13 1344     01/08/13 1200  ciprofloxacin (CIPRO) IVPB 400 mg     400 mg 200 mL/hr over 60 Minutes Intravenous  Once 01/08/13 1156 01/08/13 1328   01/08/13 1200  metroNIDAZOLE (FLAGYL) IVPB 500 mg     500 mg 100 mL/hr over 60 Minutes Intravenous  Once 01/08/13 1156         PHYSICAL EXAM: Vital signs in last 24 hours: Filed Vitals:   01/08/13 2300 01/09/13 0158 01/09/13 0541 01/09/13 0716  BP: 153/93 123/80 147/107 157/87  Pulse: 92  50   Temp:   98.5 F (36.9 C)   TempSrc:   Oral   Resp:   20   Height:      Weight:      SpO2:   100%     Weight change:  Filed Weights   01/08/13 1300  Weight: 117.89 kg (259 lb 14.4 oz)   Body mass index is 38.36 kg/(m^2).   Gen Exam: Awake and alert with clear speech.   Neck: Supple, No JVD.   Chest: B/L Clear.   CVS: S1 S2 Regular, no  murmurs.  Abdomen: soft, BS +, mildly tender RLQ, non distended.  Extremities: no edema, lower extremities warm to touch. Neurologic: Non Focal.   Skin: No Rash.   Wounds: N/A.    Intake/Output from previous day:  Intake/Output Summary (Last 24 hours) at 01/09/13 1218 Last data filed at 01/09/13 1044  Gross per 24 hour  Intake 2453.67 ml  Output    250 ml  Net 2203.67 ml     LAB RESULTS: CBC  Recent Labs Lab 01/08/13 0805 01/08/13 1441 01/09/13 0546  WBC 7.7 10.0 8.9  HGB 10.9* 11.3* 11.3*  HCT 34.2* 36.0 35.9*  PLT 271 237 260  MCV 76.9* 77.6* 77.5*   MCH 24.5* 24.4* 24.4*  MCHC 31.9 31.4 31.5  RDW 16.4* 16.4* 16.6*  LYMPHSABS 2.0  --   --   MONOABS 0.5  --   --   EOSABS 0.0  --   --   BASOSABS 0.0  --   --     Chemistries   Recent Labs Lab 01/08/13 0805 01/08/13 1441 01/09/13 0546  NA 141  --  141  K 3.6  --  3.5  CL 106  --  104  CO2 24  --  24  GLUCOSE 101*  --  101*  BUN 10  --  8  CREATININE 0.88 0.81 0.88  CALCIUM 8.4  --  8.8    CBG: No results found for this basename: GLUCAP,  in the last 168 hours  GFR Estimated Creatinine Clearance: 108.4 ml/min (by C-G formula based on Cr of 0.88).  Coagulation profile No results found for this basename: INR, PROTIME,  in the last 168 hours  Cardiac Enzymes No results found for this basename: CK, CKMB, TROPONINI, MYOGLOBIN,  in the last 168 hours  No components found with this basename: POCBNP,  No results found for this basename: DDIMER,  in the last 72 hours No results found for this basename: HGBA1C,  in the last 72 hours No results found for this basename: CHOL, HDL, LDLCALC, TRIG, CHOLHDL, LDLDIRECT,  in the last 72 hours No results found for this basename: TSH, T4TOTAL, FREET3, T3FREE, THYROIDAB,  in the last 72 hours No results found for this basename: VITAMINB12, FOLATE, FERRITIN, TIBC, IRON, RETICCTPCT,  in the last 72 hours  Recent Labs  01/08/13 0805  LIPASE 19    Urine Studies No results found for this basename: UACOL, UAPR, USPG, UPH, UTP, UGL, UKET, UBIL, UHGB, UNIT, UROB, ULEU, UEPI, UWBC, URBC, UBAC, CAST, CRYS, UCOM, BILUA,  in the last 72 hours  MICROBIOLOGY: No results found for this or any previous visit (from the past 240 hour(s)).  RADIOLOGY STUDIES/RESULTS: Ct Abdomen Pelvis W Contrast  01/08/2013   CLINICAL DATA:  Abdominal pain, right flank pain, diarrhea and chills. Blood in stool.  EXAM: CT ABDOMEN AND PELVIS WITH CONTRAST  TECHNIQUE: Multidetector CT imaging of the abdomen and pelvis was performed using the standard protocol  following bolus administration of intravenous contrast.  CONTRAST:  152m OMNIPAQUE IOHEXOL 300 MG/ML  SOLN  COMPARISON:  04/10/2009  FINDINGS: Inflammatory process is identified involving the terminal ileum and proximal colon with diffuse submucosal fatty infiltration in the terminal ileum, cecum and ascending colon noted up to the level of the hepatic flexure. Findings are nearly pathognomonic for inflammatory bowel disease and particularly Crohn's disease. Associated inflammatory change in the pericolonic fat noted as well as multiple prominent right lower quadrant mesenteric nodes just inferior to the cecum. No associated perforation, abscess  or bowel obstruction is identified. No fistulas are seen by CT. Correlation with colonoscopy may be helpful and Gastroenterology referral is recommended.  The rest of the bowel is unremarkable. Stable calcified gallstones identified. The liver, pancreas, spleen and adrenal glands are within normal limits. There is stable scarring noted involving both kidneys without hydronephrosis or renal calculi. Left adnexal cyst identified measuring approximately 2.7 x 3.4 cm. The bladder is unremarkable. No hernias are identified. Scoliosis and spinal fusion rod identified.  IMPRESSION: Findings consistent with inflammatory bowel disease with acute inflammation noted involving the terminal ileum and proximal colon as well as diffuse and prominent submucosal fatty infiltration of the terminal ileum and proximal colon. Findings are nearly pathognomonic for Crohn's disease. Infectious colitis is also a possibility. Associated prominent right lower quadrant mesenteric lymph nodes are seen. Recommend Gastroenterology referral. No associated abscess or bowel perforation identified.   Electronically Signed   By: Aletta Edouard M.D.   On: 01/08/2013 10:45    Oren Binet, MD  [facility] Pager:[phone]  If 7PM-7AM, please contact night-coverage [email or web address] Password  [facility] 01/09/2013, 12:18 PM   LOS: 1 day

## 2013-01-09 NOTE — Progress Notes (Signed)
Spoke to Dr. Benson Norway who stated that patient needs to finish prep asap. Phenergan verbally ordered and order put in by nursing. Pt given phenergan and zofran IV and highly encouraged to finish prep before 10am.  I will monitor pt q30 minutes and notify Dr. Benson Norway at 10am of results.

## 2013-01-09 NOTE — Care Management Note (Signed)
    Page 1 of 1   01/12/2013     2:22:01 PM   CARE MANAGEMENT NOTE 01/12/2013  Patient:  Kristen [name], Kristen [name]   Account Number:  [id number]  Date Initiated:  01/09/2013  Documentation initiated by:  Tomi Bamberger  Subjective/Objective Assessment:   dx colitis  admit- lives with kids.     Action/Plan:   Anticipated DC Date:  01/10/2013   Anticipated DC Plan:  [place]  CM consult      Choice offered to / List presented to:             Status of service:  Completed, signed off Medicare Important Message given?   (If response is "NO", the following Medicare IM given date fields will be blank) Date Medicare IM given:   Date Additional Medicare IM given:    Discharge Disposition:  HOME/SELF CARE  Per UR Regulation:  Reviewed for med. necessity/level of care/duration of stay  If discussed at [place] of Stay Meetings, dates discussed:    Comments:  01/09/13 16:44 Tomi Bamberger RN, BSN [phone] patient lives with her kids, pta indep.  NCM will continue to follow for dc needs.

## 2013-01-09 NOTE — Interval H&P Note (Signed)
History and Physical Interval Note:  01/09/2013 4:25 PM  Kristen [name]  has presented today for surgery, with the diagnosis of Colitis  The various methods of treatment have been discussed with the patient and family. After consideration of risks, benefits and other options for treatment, the patient has consented to  Procedure(s): COLONOSCOPY (N/A) as a surgical intervention .  The patient's history has been reviewed, patient examined, no change in status, stable for surgery.  I have reviewed the patient's chart and labs.  Questions were answered to the patient's satisfaction.     Tamber Burtch D

## 2013-01-10 ENCOUNTER — Encounter (HOSPITAL_COMMUNITY): Payer: Self-pay | Admitting: Gastroenterology

## 2013-01-10 LAB — GI PATHOGEN PANEL BY PCR, STOOL
C difficile toxin A/B: NEGATIVE
Campylobacter by PCR: NEGATIVE
E coli (ETEC) LT/ST: NEGATIVE
E coli (STEC): POSITIVE
G lamblia by PCR: NEGATIVE
Rotavirus A by PCR: NEGATIVE
Salmonella by PCR: NEGATIVE

## 2013-01-10 MED ORDER — METRONIDAZOLE 500 MG PO TABS: 500.0000 mg | ORAL_TABLET | Freq: Three times a day (TID) | ORAL | Status: DC

## 2013-01-10 MED ORDER — CIPROFLOXACIN HCL 500 MG PO TABS: 500.0000 mg | ORAL_TABLET | Freq: Two times a day (BID) | ORAL | Status: DC

## 2013-01-10 NOTE — Discharge Summary (Signed)
Physician Discharge Summary  Kristen [name] ZJI:[id number] DOB: 1965/09/16 DOA: 01/08/2013  PCP: Philis Fendt, MD  Admit date: 01/08/2013 Discharge date: 01/10/2013  Time spent: 40 minutes  Recommendations for Outpatient Follow-up:  1. Followup with primary care physician within one week. 2. Followup with Dr. Benson Norway of the gastroenterology service for biopsy results in 2 weeks.  Discharge Diagnoses:  Principal Problem:   Colitis Active Problems:   Anemia   HTN (hypertension)   Morbid obesity   Discharge Condition: Stable  Diet recommendation: Regular  Filed Weights   01/08/13 1300  Weight: 117.89 kg (259 lb 14.4 oz)    History of present illness:  Kristen [name] is a 47 y.o. female with a Past Medical History of hypertension, chronic microcytic anemia who presents today with the above noted complaint. The patient, she was in her usual state of health in last Friday when she started developing abdominal pain on the right side of the abdomen. Patient describes the abdominal pain as crampy, 10/10 at its worst, no radiation. She has associated nausea, but no vomiting. She does give a history of having diarrhea with minimal blood in the stools. She has some subjective fever with chills. Initially, she claims that the pain is like her usual menstrual cramps, however was persistent and worsened, as a result she presented to the ED today. CT scan of the abdomen showed terminal ileitis and proximal colitis. The hospitalist service was asked to admit this patient for further evaluation and treatment  Patient claims that she's never had similar symptoms like this in the past, she has never had a colonoscopy, however she claims that she saw a GI M.D. many years ago for GERD-like symptoms and had an endoscopy.  Hospital Course:   1. Terminal ileitis/colitis: Patient presented with right sided abdominal pain, associated with nausea and diarrhea. CT scan of abdomen and pelvis was done and  showed inflammatory changes almost identical to the one seen in Crohn's disease. The patient did not have any history of IBD, gastroenterology was consulted, recommended to start patient empirically on ciprofloxacin and metronidazole. Colonoscopy was done and biopsies were taken. Patient had significant improvement with antibiotics, no steroids or anti-inflammatory were given. Her stool is negative for C. difficile. Gastroenterology recommended to treat for 10 more days with antibiotics, followup as outpatient for further recommendations. And for the biopsy results.  2. Anemia: Chronic anemia, hemoglobin is stable.  3. Hypertension: Patient is on lisinopril, that was continued, blood pressure was in the high side likely secondary to the acute illness/pain.  4. Morbid obesity: Counseled regarding weight loss extensively.  Procedures:  Colonoscopy done by Dr. Benson Norway on 12/10/2012 showed diffuse patchy colitis, left-sided diverticula and internal/external hemorrhoids.  Consultations:  Carol Ada of gastroenterology  Discharge Exam: Filed Vitals:   01/10/13 0754  BP: 120/84  Pulse:   Temp:   Resp:    General: Alert and awake, oriented x3, not in any acute distress. HEENT: anicteric sclera, pupils reactive to light and accommodation, EOMI CVS: S1-S2 clear, no murmur rubs or gallops Chest: clear to auscultation bilaterally, no wheezing, rales or rhonchi Abdomen: soft nontender, nondistended, normal bowel sounds, no organomegaly Extremities: no cyanosis, clubbing or edema noted bilaterally Neuro: Cranial nerves II-XII intact, no focal neurological deficits  Discharge Instructions  Discharge Orders   Future Orders Complete By Expires   Increase activity slowly  As directed        Medication List         ciprofloxacin 500  MG tablet  Commonly known as:  CIPRO  Take 1 tablet (500 mg total) by mouth 2 (two) times daily.     ibuprofen 200 MG tablet  Commonly known as:  ADVIL,MOTRIN   Take 400 mg by mouth every 6 (six) hours as needed.     lisinopril 10 MG tablet  Commonly known as:  PRINIVIL,ZESTRIL  Take 10 mg by mouth daily.     metroNIDAZOLE 500 MG tablet  Commonly known as:  FLAGYL  Take 1 tablet (500 mg total) by mouth 3 (three) times daily.       Allergies  Allergen Reactions  . Other Hives and Swelling    tomato  . Sulfur Hives and Swelling       Follow-up Information   Follow up with AVBUERE,EDWIN A, MD In 1 week.   Specialty:  Internal Medicine   Contact information:   [place] [address] [phone]       Follow up with Beryle Beams, MD In 1 week.   Specialty:  Gastroenterology   Contact information:   [address] [place] [address] [phone]        The results of significant diagnostics from this hospitalization (including imaging, microbiology, ancillary and laboratory) are listed below for reference.    Significant Diagnostic Studies: Ct Abdomen Pelvis W Contrast  01/08/2013   CLINICAL DATA:  Abdominal pain, right flank pain, diarrhea and chills. Blood in stool.  EXAM: CT ABDOMEN AND PELVIS WITH CONTRAST  TECHNIQUE: Multidetector CT imaging of the abdomen and pelvis was performed using the standard protocol following bolus administration of intravenous contrast.  CONTRAST:  165m OMNIPAQUE IOHEXOL 300 MG/ML  SOLN  COMPARISON:  04/10/2009  FINDINGS: Inflammatory process is identified involving the terminal ileum and proximal colon with diffuse submucosal fatty infiltration in the terminal ileum, cecum and ascending colon noted up to the level of the hepatic flexure. Findings are nearly pathognomonic for inflammatory bowel disease and particularly Crohn's disease. Associated inflammatory change in the pericolonic fat noted as well as multiple prominent right lower quadrant mesenteric nodes just inferior to the cecum. No associated perforation, abscess or bowel obstruction is identified. No  fistulas are seen by CT. Correlation with colonoscopy may be helpful and Gastroenterology referral is recommended.  The rest of the bowel is unremarkable. Stable calcified gallstones identified. The liver, pancreas, spleen and adrenal glands are within normal limits. There is stable scarring noted involving both kidneys without hydronephrosis or renal calculi. Left adnexal cyst identified measuring approximately 2.7 x 3.4 cm. The bladder is unremarkable. No hernias are identified. Scoliosis and spinal fusion rod identified.  IMPRESSION: Findings consistent with inflammatory bowel disease with acute inflammation noted involving the terminal ileum and proximal colon as well as diffuse and prominent submucosal fatty infiltration of the terminal ileum and proximal colon. Findings are nearly pathognomonic for Crohn's disease. Infectious colitis is also a possibility. Associated prominent right lower quadrant mesenteric lymph nodes are seen. Recommend Gastroenterology referral. No associated abscess or bowel perforation identified.   Electronically Signed   By: GAletta EdouardM.D.   On: 01/08/2013 10:45    Microbiology: Recent Results (from the past 240 hour(s))  CLOSTRIDIUM DIFFICILE BY PCR     Status: None   Collection Time    01/09/13  2:16 PM      Result Value Range Status   C difficile by pcr NEGATIVE  NEGATIVE Final     Labs: Basic Metabolic Panel:  Recent Labs Lab 01/08/13 0805  01/08/13 1441 01/09/13 0546  NA 141  --  141  K 3.6  --  3.5  CL 106  --  104  CO2 24  --  24  GLUCOSE 101*  --  101*  BUN 10  --  8  CREATININE 0.88 0.81 0.88  CALCIUM 8.4  --  8.8   Liver Function Tests:  Recent Labs Lab 01/08/13 0805 01/09/13 0546  AST 17 19  ALT 10 13  ALKPHOS 87 90  BILITOT 0.4 0.6  PROT 7.0 7.2  ALBUMIN 3.3* 3.4*    Recent Labs Lab 01/08/13 0805  LIPASE 19   No results found for this basename: AMMONIA,  in the last 168 hours CBC:  Recent Labs Lab 01/08/13 0805  01/08/13 1441 01/09/13 0546  WBC 7.7 10.0 8.9  NEUTROABS 5.2  --   --   HGB 10.9* 11.3* 11.3*  HCT 34.2* 36.0 35.9*  MCV 76.9* 77.6* 77.5*  PLT 271 237 260   Cardiac Enzymes: No results found for this basename: CKTOTAL, CKMB, CKMBINDEX, TROPONINI,  in the last 168 hours BNP: BNP (last 3 results) No results found for this basename: PROBNP,  in the last 8760 hours CBG: No results found for this basename: GLUCAP,  in the last 168 hours     Signed:  Mickle Campton A  [facility] 01/10/2013, 12:07 PM

## 2013-01-10 NOTE — Progress Notes (Signed)
Subjective: Mild diarrhea with urination.  Overall she feels much better.  Objective: Vital signs in last 24 hours: Temp:  [98.7 F (37.1 C)-99.2 F (37.3 C)] 99.2 F (37.3 C) (12/24 0457) Pulse Rate:  [79-115] 89 (12/24 0457) Resp:  [16-26] 20 (12/24 0457) BP: (103-200)/(60-133) 120/84 mmHg (12/24 0754) SpO2:  [93 %-100 %] 94 % (12/24 0457) Last BM Date: 01/10/13  Intake/Output from previous day: 12/23 0701 - 12/24 0700 In: 2813.7 [P.O.:360; I.V.:1953.7; IV Piggyback:500] Out: 600 [Stool:600] Intake/Output this shift:    General appearance: alert and no distress GI: soft, non-tender; bowel sounds normal; no masses,  no organomegaly  Lab Results:  Recent Labs  01/08/13 0805 01/08/13 1441 01/09/13 0546  WBC 7.7 10.0 8.9  HGB 10.9* 11.3* 11.3*  HCT 34.2* 36.0 35.9*  PLT 271 237 260   BMET  Recent Labs  01/08/13 0805 01/08/13 1441 01/09/13 0546  NA 141  --  141  K 3.6  --  3.5  CL 106  --  104  CO2 24  --  24  GLUCOSE 101*  --  101*  BUN 10  --  8  CREATININE 0.88 0.81 0.88  CALCIUM 8.4  --  8.8   LFT  Recent Labs  01/09/13 0546  PROT 7.2  ALBUMIN 3.4*  AST 19  ALT 13  ALKPHOS 90  BILITOT 0.6   PT/INR No results found for this basename: LABPROT, INR,  in the last 72 hours Hepatitis Panel No results found for this basename: HEPBSAG, HCVAB, HEPAIGM, HEPBIGM,  in the last 72 hours C-Diff No results found for this basename: CDIFFTOX,  in the last 72 hours Fecal Lactopherrin No results found for this basename: FECLLACTOFRN,  in the last 72 hours  Studies/Results: Ct Abdomen Pelvis W Contrast  01/08/2013   CLINICAL DATA:  Abdominal pain, right flank pain, diarrhea and chills. Blood in stool.  EXAM: CT ABDOMEN AND PELVIS WITH CONTRAST  TECHNIQUE: Multidetector CT imaging of the abdomen and pelvis was performed using the standard protocol following bolus administration of intravenous contrast.  CONTRAST:  171m OMNIPAQUE IOHEXOL 300 MG/ML  SOLN   COMPARISON:  04/10/2009  FINDINGS: Inflammatory process is identified involving the terminal ileum and proximal colon with diffuse submucosal fatty infiltration in the terminal ileum, cecum and ascending colon noted up to the level of the hepatic flexure. Findings are nearly pathognomonic for inflammatory bowel disease and particularly Crohn's disease. Associated inflammatory change in the pericolonic fat noted as well as multiple prominent right lower quadrant mesenteric nodes just inferior to the cecum. No associated perforation, abscess or bowel obstruction is identified. No fistulas are seen by CT. Correlation with colonoscopy may be helpful and Gastroenterology referral is recommended.  The rest of the bowel is unremarkable. Stable calcified gallstones identified. The liver, pancreas, spleen and adrenal glands are within normal limits. There is stable scarring noted involving both kidneys without hydronephrosis or renal calculi. Left adnexal cyst identified measuring approximately 2.7 x 3.4 cm. The bladder is unremarkable. No hernias are identified. Scoliosis and spinal fusion rod identified.  IMPRESSION: Findings consistent with inflammatory bowel disease with acute inflammation noted involving the terminal ileum and proximal colon as well as diffuse and prominent submucosal fatty infiltration of the terminal ileum and proximal colon. Findings are nearly pathognomonic for Crohn's disease. Infectious colitis is also a possibility. Associated prominent right lower quadrant mesenteric lymph nodes are seen. Recommend Gastroenterology referral. No associated abscess or bowel perforation identified.   Electronically Signed   By:  Aletta Edouard M.D.   On: 01/08/2013 10:45    Medications:  Scheduled: . ciprofloxacin  400 mg Intravenous Q12H  . enoxaparin (LOVENOX) injection  40 mg Subcutaneous Q24H  . famotidine  20 mg Oral Daily  . hydrALAZINE  25 mg Oral Q8H  . lisinopril  20 mg Oral Daily  .  metronidazole  500 mg Intravenous Once  . metronidazole  500 mg Intravenous Q8H   Continuous: . sodium chloride 0.45 % 1,000 mL with potassium chloride 20 mEq infusion 100 mL/hr at 01/09/13 2045    Assessment/Plan: 1) Colitis. 2) Minimal diarrhea.   The patient is clinically better.  She is negative for C. Diff.  It appears that she has responded to the antibiotics.  I cannot be certain if she has IBD versus an infectious colitis.    Plan: 1) Okay to D/C home with antibiotics.  Rx x 10 days. 2) I will follow up on the biopsies and treat accordingly. 3) Follow up in the office in two weeks.  LOS: 2 days   Abelino Tippin D 01/10/2013, 8:15 AM

## 2013-01-10 NOTE — Progress Notes (Signed)
Kristen [name] to be D/C'd Home per MD order.  Discussed with the patient and all questions fully answered.    Medication List         ciprofloxacin 500 MG tablet  Commonly known as:  CIPRO  Take 1 tablet (500 mg total) by mouth 2 (two) times daily.     ibuprofen 200 MG tablet  Commonly known as:  ADVIL,MOTRIN  Take 400 mg by mouth every 6 (six) hours as needed.     lisinopril 10 MG tablet  Commonly known as:  PRINIVIL,ZESTRIL  Take 10 mg by mouth daily.     metroNIDAZOLE 500 MG tablet  Commonly known as:  FLAGYL  Take 1 tablet (500 mg total) by mouth 3 (three) times daily.        VVS, Skin clean, dry and intact without evidence of skin break down, no evidence of skin tears noted. IV catheter discontinued intact. Site without signs and symptoms of complications. Dressing and pressure applied.  An After Visit Summary was printed and given to the patient.  D/c education completed with patient/family including follow up instructions, medication list, d/c activities limitations if indicated, with other d/c instructions as indicated by MD - patient able to verbalize understanding, all questions fully answered.   Patient instructed to return to ED, call 911, or call MD for any changes in condition.   Patient escorted via [place], and D/C home via private auto.  Wonda Cerise D 01/10/2013 12:20 PM

## 2013-04-23 ENCOUNTER — Other Ambulatory Visit (HOSPITAL_COMMUNITY): Payer: Self-pay | Admitting: Obstetrics

## 2013-04-23 DIAGNOSIS — R1013 Epigastric pain: Secondary | ICD-10-CM

## 2013-05-02 ENCOUNTER — Ambulatory Visit (HOSPITAL_COMMUNITY): Payer: BC Managed Care – PPO

## 2013-05-10 ENCOUNTER — Ambulatory Visit (HOSPITAL_COMMUNITY)
Admission: RE | Admit: 2013-05-10 | Discharge: 2013-05-10 | Disposition: A | Discharge status: Home / Self Care | Payer: BC Managed Care – PPO | Source: Ambulatory Visit | Attending: Obstetrics | Admitting: Obstetrics

## 2013-05-10 DIAGNOSIS — R1011 Right upper quadrant pain: Secondary | ICD-10-CM | POA: Insufficient documentation

## 2013-05-10 DIAGNOSIS — K8020 Calculus of gallbladder without cholecystitis without obstruction: Secondary | ICD-10-CM | POA: Insufficient documentation

## 2013-05-10 DIAGNOSIS — R1013 Epigastric pain: Secondary | ICD-10-CM | POA: Insufficient documentation

## 2013-07-10 ENCOUNTER — Ambulatory Visit (INDEPENDENT_AMBULATORY_CARE_PROVIDER_SITE_OTHER): Payer: BC Managed Care – PPO | Admitting: General Surgery

## 2013-07-10 ENCOUNTER — Encounter (INDEPENDENT_AMBULATORY_CARE_PROVIDER_SITE_OTHER): Payer: Self-pay | Admitting: General Surgery

## 2013-07-10 VITALS — BP 140/82 | HR 82 | Temp 97.8°F | Ht 69.0 in | Wt 254.0 lb

## 2013-07-10 DIAGNOSIS — K8020 Calculus of gallbladder without cholecystitis without obstruction: Secondary | ICD-10-CM | POA: Insufficient documentation

## 2013-07-10 NOTE — Progress Notes (Addendum)
Patient ID: Kristen [name], female   DOB: Mar 30, 1965, 48 y.o.   MRN: [id number]  Chief Complaint  Patient presents with  . eval gallstones    HPI Kristen [name] is a 48 y.o. female.  She comes in for evaluation for symptomatic cholelithiasis.  HPI Apparently the patient has been having some symptoms since December of 2014. At that time she was admitted for nausea vomiting and diarrhea. She ended up getting a colonoscopy but no definitive source of the diarrhea was found.  She currently has urgent episodes of stool incontinence are brought on by having to hold her bowels and a urine for longer time. The only way she can control her diarrhea is to take Imodium right away. Along with that she get some upper abdominal tenderness especially in the epigastrium that radiates directly back to her back. This is associated with nausea and occasional vomiting. She's had no fevers or chills. She's had no noted jaundice. It seems as her problem is exacerbated by eating certain foods specifically a greasy or oily foods. She avoids spicy foods because of a history of a hiatal hernia. Past Medical History  Diagnosis Date  . Hypertension   . Scoliosis   . Anemia     Past Surgical History  Procedure Laterality Date  . Hernia repair    . Back surgery    . Tubal ligation    . Tonsillectomy    . Colonoscopy N/A 01/09/2013    Procedure: COLONOSCOPY;  Surgeon: Beryle Beams, MD;  Location: [facility];  Service: Endoscopy;  Laterality: N/A;    Family History  Problem Relation Age of Onset  . Heart disease Father     Social History History  Substance Use Topics  . Smoking status: Never Smoker   . Smokeless tobacco: Never Used  . Alcohol Use: Yes    Allergies  Allergen Reactions  . Other Hives and Swelling    tomato  . Sulfur Hives and Swelling    Current Outpatient Prescriptions  Medication Sig Dispense Refill  . esomeprazole (NEXIUM) 40 MG capsule       . ibuprofen (ADVIL,MOTRIN)  200 MG tablet Take 400 mg by mouth every 6 (six) hours as needed.      Marland Kitchen lisinopril (PRINIVIL,ZESTRIL) 10 MG tablet Take 10 mg by mouth daily.       No current facility-administered medications for this visit.    Review of Systems Review of Systems  Constitutional: Negative.   HENT: Negative.   Gastrointestinal: Positive for nausea, vomiting, abdominal pain and diarrhea.  All other systems reviewed and are negative.   Blood pressure 140/82, pulse 82, temperature 97.8 F (36.6 C), height 5' 9"  (1.753 m), weight 254 lb (115.214 kg).  Physical Exam Physical Exam  Constitutional: She is oriented to person, place, and time. She appears well-developed.  Obese  HENT:  Head: Normocephalic and atraumatic.  Eyes: Conjunctivae and EOM are normal. Pupils are equal, round, and reactive to light.  Neck: Normal range of motion. Neck supple.  Cardiovascular: Normal rate, regular rhythm, normal heart sounds and intact distal pulses.   Pulmonary/Chest: Effort normal and breath sounds normal.  Abdominal: Soft. Normal appearance and bowel sounds are normal. There is tenderness in the epigastric area. There is no rigidity, no rebound and no guarding.  Musculoskeletal: Normal range of motion.  Neurological: She is alert and oriented to person, place, and time. She has normal reflexes.  Skin: Skin is warm and dry.  Psychiatric: She has a  normal mood and affect. Her behavior is normal. Judgment and thought content normal.    Data Reviewed I have reviewed the patient's recent ultrasound report.  Assessment    Symptomatic cholelithiasis with diarrhea and abdominal pain.     Plan    I have discussed with the patient risk and benefits of surgery and she wishes to proceed with a laparoscopic cholecystectomy.  The patient will receive preoperative antibiotics. Orders will be placed.       Gwenyth Ober 07/10/2013, 9:03 AM

## 2013-08-06 ENCOUNTER — Encounter (HOSPITAL_COMMUNITY): Payer: Self-pay

## 2013-08-09 NOTE — Pre-Procedure Instructions (Signed)
Kristen [name]  08/09/2013   Your procedure is scheduled on:  Wednesday, July 29  Report to [facility] Admitting at 6:30 AM.  Call this number if you have problems the morning of surgery: [phone]   Remember:   Do not eat food or drink liquids after midnight Tuesday, July 28.   Take these medicines the morning of surgery with A SIP OF WATER: esomeprazole ([place]).               Stop taking ibuprofen (ADVIL,MOTRIN).    Do not wear jewelry, make-up or nail polish.  Do not wear lotions, powders, or perfumes.  Do not shave 48 hours prior to surgery.   Do not bring valuables to the hospital.              [facility] is not responsible  for any belongings or valuables.               Contacts, dentures or bridgework may not be worn into surgery.  Leave suitcase in the car. After surgery it may be brought to your room.  For patients admitted to the hospital, discharge time is determined by your treatment team.               Patients discharged the day of surgery will not be allowed to drive home.  Name and phone number of your driver: -               Special Instructions: Review  [facility] - Preparing For Surgery.   Please read over the following fact sheets that you were given: Pain Booklet, Coughing and Deep Breathing and Surgical Site Infection Prevention

## 2013-08-10 ENCOUNTER — Encounter (HOSPITAL_COMMUNITY): Payer: Self-pay

## 2013-08-10 ENCOUNTER — Encounter (HOSPITAL_COMMUNITY)
Admission: RE | Admit: 2013-08-10 | Discharge: 2013-08-10 | Disposition: A | Discharge status: Home / Self Care | Payer: BC Managed Care – PPO | Source: Ambulatory Visit | Attending: General Surgery | Admitting: General Surgery

## 2013-08-10 DIAGNOSIS — Z01812 Encounter for preprocedural laboratory examination: Secondary | ICD-10-CM | POA: Insufficient documentation

## 2013-08-10 DIAGNOSIS — Z01810 Encounter for preprocedural cardiovascular examination: Secondary | ICD-10-CM | POA: Insufficient documentation

## 2013-08-10 DIAGNOSIS — I10 Essential (primary) hypertension: Secondary | ICD-10-CM | POA: Insufficient documentation

## 2013-08-10 DIAGNOSIS — Z01818 Encounter for other preprocedural examination: Secondary | ICD-10-CM | POA: Insufficient documentation

## 2013-08-10 DIAGNOSIS — M4120 Other idiopathic scoliosis, site unspecified: Secondary | ICD-10-CM | POA: Insufficient documentation

## 2013-08-10 HISTORY — DX: Personal history of other diseases of the digestive system: Z87.19

## 2013-08-10 HISTORY — DX: Headache: R51

## 2013-08-10 HISTORY — DX: Gastro-esophageal reflux disease without esophagitis: K21.9

## 2013-08-10 HISTORY — DX: Shortness of breath: R06.02

## 2013-08-10 HISTORY — DX: Personal history of urinary calculi: Z87.442

## 2013-08-10 LAB — CBC WITH DIFFERENTIAL/PLATELET
Basophils Absolute: 0 10*3/uL (ref 0.0–0.1)
Basophils Relative: 0 % (ref 0–1)
EOS ABS: 0.4 10*3/uL (ref 0.0–0.7)
EOS PCT: 6 % — AB (ref 0–5)
HCT: 37.6 % (ref 36.0–46.0)
HEMOGLOBIN: 12 g/dL (ref 12.0–15.0)
LYMPHS ABS: 2.2 10*3/uL (ref 0.7–4.0)
Lymphocytes Relative: 30 % (ref 12–46)
MCH: 26.4 pg (ref 26.0–34.0)
MCHC: 31.9 g/dL (ref 30.0–36.0)
MCV: 82.6 fL (ref 78.0–100.0)
MONOS PCT: 6 % (ref 3–12)
Monocytes Absolute: 0.5 10*3/uL (ref 0.1–1.0)
Neutro Abs: 4.3 10*3/uL (ref 1.7–7.7)
Neutrophils Relative %: 58 % (ref 43–77)
PLATELETS: 242 10*3/uL (ref 150–400)
RBC: 4.55 MIL/uL (ref 3.87–5.11)
RDW: 15 % (ref 11.5–15.5)
WBC: 7.4 10*3/uL (ref 4.0–10.5)

## 2013-08-10 LAB — COMPREHENSIVE METABOLIC PANEL
ALT: 19 U/L (ref 0–35)
ANION GAP: 14 (ref 5–15)
AST: 20 U/L (ref 0–37)
Albumin: 3.8 g/dL (ref 3.5–5.2)
Alkaline Phosphatase: 91 U/L (ref 39–117)
BUN: 8 mg/dL (ref 6–23)
CALCIUM: 8.8 mg/dL (ref 8.4–10.5)
CO2: 26 mEq/L (ref 19–32)
Chloride: 103 mEq/L (ref 96–112)
Creatinine, Ser: 0.77 mg/dL (ref 0.50–1.10)
GFR calc non Af Amer: >90 mL/min (ref >90)
GLUCOSE: 93 mg/dL (ref 70–99)
Potassium: 4 mEq/L (ref 3.7–5.3)
Sodium: 143 mEq/L (ref 137–147)
TOTAL PROTEIN: 7.5 g/dL (ref 6.0–8.3)
Total Bilirubin: 0.6 mg/dL (ref 0.3–1.2)

## 2013-08-10 LAB — HCG, SERUM, QUALITATIVE: Preg, Serum: NEGATIVE

## 2013-08-10 NOTE — Progress Notes (Signed)
08/10/13 0944  OBSTRUCTIVE SLEEP APNEA  Have you ever been diagnosed with sleep apnea through a sleep study? No  Do you snore loudly (loud enough to be heard through closed doors)?  1  Do you often feel tired, fatigued, or sleepy during the daytime? 1  Has anyone observed you stop breathing during your sleep? 1  Do you have, or are you being treated for high blood pressure? 1  BMI more than 35 kg/m2? 1  Age over 48 years old? 0  Neck circumference greater than 40 cm/16 inches? 1 (16.5)  Gender: 0  Obstructive Sleep Apnea Score 6  Score 4 or greater  Results sent to PCP

## 2013-08-13 ENCOUNTER — Encounter (HOSPITAL_COMMUNITY): Payer: Self-pay

## 2013-08-13 NOTE — Progress Notes (Signed)
Anesthesia Chart Review:  Patient is a 48 year old female scheduled for laparoscopic cholecystectomy on 08/15/13 by Dr. Hulen Skains.  History includes non-smoker, HTN, exertional dyspnea, scoliosis, anemia, headaches, hiatal hernia, nephrolithiasis, back surgery, hernia repair, tonsillectomy. BMI is consistent with obesity.  OSA screening score was 6.  PCP is Dr. Nolene Ebbs who is aware of plans for surgery.    EKG on 08/10/13 showed: NSR, moderate voltage criteria for LVH, may be normal variant, non-specific T wave abnormality.   CXR on 08/10/13 showed: No active disease. Thoracolumbar scoliosis.  Preoperative labs noted.    If no acute changes then I would anticipate that she could proceed as planned.  [facility] Short Stay Center/Anesthesiology Phone [phone] 08/13/2013 9:52 AM

## 2013-08-14 MED ORDER — DEXTROSE 5 % IV SOLN
2.0000 g | INTRAVENOUS | Status: AC
  Administered 2013-08-15: 2 g via INTRAVENOUS
  Filled 2013-08-14: qty 2

## 2013-08-14 MED ORDER — CHLORHEXIDINE GLUCONATE 4 % EX LIQD
1.0000 "application " | Freq: Once | CUTANEOUS | Status: DC
  Filled 2013-08-14: qty 15

## 2013-08-15 ENCOUNTER — Ambulatory Visit (HOSPITAL_COMMUNITY)
Admission: RE | Admit: 2013-08-15 | Discharge: 2013-08-15 | Disposition: A | Discharge status: Home / Self Care | Payer: BC Managed Care – PPO | Source: Ambulatory Visit | Attending: General Surgery | Admitting: General Surgery

## 2013-08-15 ENCOUNTER — Encounter (HOSPITAL_COMMUNITY): Payer: Self-pay | Admitting: Surgery

## 2013-08-15 ENCOUNTER — Encounter (HOSPITAL_COMMUNITY): Payer: BC Managed Care – PPO | Admitting: Vascular Surgery

## 2013-08-15 ENCOUNTER — Ambulatory Visit (HOSPITAL_COMMUNITY): Payer: BC Managed Care – PPO

## 2013-08-15 ENCOUNTER — Ambulatory Visit (HOSPITAL_COMMUNITY): Payer: BC Managed Care – PPO | Admitting: Anesthesiology

## 2013-08-15 ENCOUNTER — Encounter (HOSPITAL_COMMUNITY)
Admission: RE | Disposition: A | Discharge status: Home / Self Care | Payer: Self-pay | Source: Ambulatory Visit | Attending: General Surgery

## 2013-08-15 DIAGNOSIS — K219 Gastro-esophageal reflux disease without esophagitis: Secondary | ICD-10-CM | POA: Insufficient documentation

## 2013-08-15 DIAGNOSIS — K8010 Calculus of gallbladder with chronic cholecystitis without obstruction: Secondary | ICD-10-CM

## 2013-08-15 DIAGNOSIS — K8020 Calculus of gallbladder without cholecystitis without obstruction: Secondary | ICD-10-CM

## 2013-08-15 DIAGNOSIS — K449 Diaphragmatic hernia without obstruction or gangrene: Secondary | ICD-10-CM | POA: Insufficient documentation

## 2013-08-15 DIAGNOSIS — Z882 Allergy status to sulfonamides status: Secondary | ICD-10-CM | POA: Insufficient documentation

## 2013-08-15 DIAGNOSIS — I10 Essential (primary) hypertension: Secondary | ICD-10-CM | POA: Insufficient documentation

## 2013-08-15 DIAGNOSIS — Z79899 Other long term (current) drug therapy: Secondary | ICD-10-CM | POA: Insufficient documentation

## 2013-08-15 HISTORY — PX: CHOLECYSTECTOMY: SHX55

## 2013-08-15 SURGERY — LAPAROSCOPIC CHOLECYSTECTOMY WITH INTRAOPERATIVE CHOLANGIOGRAM
Anesthesia: General | Site: Abdomen

## 2013-08-15 MED ORDER — 0.9 % SODIUM CHLORIDE (POUR BTL) OPTIME
TOPICAL | Status: DC | PRN
  Administered 2013-08-15: 1000 mL

## 2013-08-15 MED ORDER — HYDROMORPHONE HCL PF 1 MG/ML IJ SOLN
INTRAMUSCULAR | Status: AC
  Filled 2013-08-15: qty 1

## 2013-08-15 MED ORDER — ROCURONIUM BROMIDE 50 MG/5ML IV SOLN
INTRAVENOUS | Status: AC
  Filled 2013-08-15: qty 1

## 2013-08-15 MED ORDER — LIDOCAINE HCL (CARDIAC) 20 MG/ML IV SOLN
INTRAVENOUS | Status: DC | PRN
  Administered 2013-08-15: 80 mg via INTRAVENOUS

## 2013-08-15 MED ORDER — FENTANYL CITRATE 0.05 MG/ML IJ SOLN
INTRAMUSCULAR | Status: AC
  Filled 2013-08-15: qty 5

## 2013-08-15 MED ORDER — MIDAZOLAM HCL 2 MG/2ML IJ SOLN
INTRAMUSCULAR | Status: AC
  Filled 2013-08-15: qty 2

## 2013-08-15 MED ORDER — GLYCOPYRROLATE 0.2 MG/ML IJ SOLN
INTRAMUSCULAR | Status: AC
  Filled 2013-08-15: qty 2

## 2013-08-15 MED ORDER — BUPIVACAINE-EPINEPHRINE (PF) 0.25% -1:200000 IJ SOLN
INTRAMUSCULAR | Status: AC
  Filled 2013-08-15: qty 30

## 2013-08-15 MED ORDER — LACTATED RINGERS IV SOLN
INTRAVENOUS | Status: DC | PRN
  Administered 2013-08-15: 08:00:00 via INTRAVENOUS

## 2013-08-15 MED ORDER — ONDANSETRON HCL 4 MG/2ML IJ SOLN
INTRAMUSCULAR | Status: DC | PRN
  Administered 2013-08-15: 4 mg via INTRAVENOUS

## 2013-08-15 MED ORDER — BUPIVACAINE-EPINEPHRINE 0.25% -1:200000 IJ SOLN
INTRAMUSCULAR | Status: DC | PRN
  Administered 2013-08-15: 30 mL

## 2013-08-15 MED ORDER — SODIUM CHLORIDE 0.9 % IV SOLN
INTRAVENOUS | Status: DC | PRN
  Administered 2013-08-15: 09:00:00

## 2013-08-15 MED ORDER — NEOSTIGMINE METHYLSULFATE 10 MG/10ML IV SOLN
INTRAVENOUS | Status: AC
  Filled 2013-08-15: qty 1

## 2013-08-15 MED ORDER — ROCURONIUM BROMIDE 100 MG/10ML IV SOLN
INTRAVENOUS | Status: DC | PRN
  Administered 2013-08-15: 50 mg via INTRAVENOUS

## 2013-08-15 MED ORDER — HYDROMORPHONE HCL PF 1 MG/ML IJ SOLN
0.2500 mg | INTRAMUSCULAR | Status: DC | PRN
  Administered 2013-08-15 (×2): 0.5 mg via INTRAVENOUS

## 2013-08-15 MED ORDER — MIDAZOLAM HCL 5 MG/5ML IJ SOLN
INTRAMUSCULAR | Status: DC | PRN
  Administered 2013-08-15: 2 mg via INTRAVENOUS

## 2013-08-15 MED ORDER — PROPOFOL 10 MG/ML IV BOLUS
INTRAVENOUS | Status: AC
  Filled 2013-08-15: qty 20

## 2013-08-15 MED ORDER — LIDOCAINE HCL (CARDIAC) 20 MG/ML IV SOLN
INTRAVENOUS | Status: AC
  Filled 2013-08-15: qty 5

## 2013-08-15 MED ORDER — OXYCODONE HCL 5 MG PO TABS: 5.0000 mg | ORAL_TABLET | Freq: Once | ORAL | Status: DC | PRN

## 2013-08-15 MED ORDER — FENTANYL CITRATE 0.05 MG/ML IJ SOLN
INTRAMUSCULAR | Status: DC | PRN
  Administered 2013-08-15: 50 ug via INTRAVENOUS

## 2013-08-15 MED ORDER — ONDANSETRON HCL 4 MG/2ML IJ SOLN: 4.0000 mg | Freq: Four times a day (QID) | INTRAMUSCULAR | Status: DC | PRN

## 2013-08-15 MED ORDER — SODIUM CHLORIDE 0.9 % IR SOLN
Status: DC | PRN
  Administered 2013-08-15 (×2): 1000 mL

## 2013-08-15 MED ORDER — ONDANSETRON HCL 4 MG/2ML IJ SOLN
INTRAMUSCULAR | Status: AC
  Filled 2013-08-15: qty 2

## 2013-08-15 MED ORDER — LABETALOL HCL 5 MG/ML IV SOLN
10.0000 mg | Freq: Once | INTRAVENOUS | Status: AC
  Administered 2013-08-15: 10 mg via INTRAVENOUS
  Filled 2013-08-15 (×2): qty 4

## 2013-08-15 MED ORDER — OXYCODONE HCL 5 MG/5ML PO SOLN: 5.0000 mg | Freq: Once | ORAL | Status: DC | PRN

## 2013-08-15 MED ORDER — NEOSTIGMINE METHYLSULFATE 10 MG/10ML IV SOLN
INTRAVENOUS | Status: DC | PRN
  Administered 2013-08-15: 3 mg via INTRAVENOUS

## 2013-08-15 MED ORDER — GLYCOPYRROLATE 0.2 MG/ML IJ SOLN
INTRAMUSCULAR | Status: DC | PRN
  Administered 2013-08-15: 0.2 mg via INTRAVENOUS
  Administered 2013-08-15: 0.4 mg via INTRAVENOUS

## 2013-08-15 MED ORDER — HYDROCODONE-ACETAMINOPHEN 5-325 MG PO TABS: 1.0000 | ORAL_TABLET | Freq: Four times a day (QID) | ORAL | Status: DC | PRN

## 2013-08-15 MED ORDER — PROPOFOL 10 MG/ML IV BOLUS
INTRAVENOUS | Status: DC | PRN
  Administered 2013-08-15: 150 mg via INTRAVENOUS

## 2013-08-15 SURGICAL SUPPLY — 50 items
ADH SKN CLS APL DERMABOND .7 (GAUZE/BANDAGES/DRESSINGS) ×1
APPLIER CLIP 5 13 M/L LIGAMAX5 (MISCELLANEOUS)
APPLIER CLIP ROT 10 11.4 M/L (STAPLE)
APR CLP MED LRG 11.4X10 (STAPLE)
APR CLP MED LRG 5 ANG JAW (MISCELLANEOUS)
BAG SPEC RTRVL LRG 6X4 10 (ENDOMECHANICALS)
BLADE SURG ROTATE 9660 (MISCELLANEOUS) IMPLANT
CANISTER SUCTION 2500CC (MISCELLANEOUS) ×2 IMPLANT
CHLORAPREP W/TINT 26ML (MISCELLANEOUS) ×2 IMPLANT
CLIP APPLIE 5 13 M/L LIGAMAX5 (MISCELLANEOUS) IMPLANT
CLIP APPLIE ROT 10 11.4 M/L (STAPLE) IMPLANT
COVER MAYO STAND STRL (DRAPES) ×2 IMPLANT
COVER SURGICAL LIGHT HANDLE (MISCELLANEOUS) ×2 IMPLANT
DERMABOND ADVANCED (GAUZE/BANDAGES/DRESSINGS) ×1
DERMABOND ADVANCED .7 DNX12 (GAUZE/BANDAGES/DRESSINGS) ×1 IMPLANT
DRAPE C-ARM 42X72 X-RAY (DRAPES) ×2 IMPLANT
DRAPE UTILITY 15X26 W/TAPE STR (DRAPE) ×4 IMPLANT
DRSG TEGADERM 2-3/8X2-3/4 SM (GAUZE/BANDAGES/DRESSINGS) ×8 IMPLANT
ELECT REM PT RETURN 9FT ADLT (ELECTROSURGICAL) ×2
ELECTRODE REM PT RTRN 9FT ADLT (ELECTROSURGICAL) ×1 IMPLANT
GLOVE BIO SURGEON STRL SZ 6.5 (GLOVE) ×1 IMPLANT
GLOVE BIO SURGEON STRL SZ7 (GLOVE) ×1 IMPLANT
GLOVE BIO SURGEON STRL SZ7.5 (GLOVE) ×1 IMPLANT
GLOVE BIOGEL PI IND STRL 7.0 (GLOVE) IMPLANT
GLOVE BIOGEL PI IND STRL 7.5 (GLOVE) IMPLANT
GLOVE BIOGEL PI IND STRL 8 (GLOVE) ×1 IMPLANT
GLOVE BIOGEL PI INDICATOR 7.0 (GLOVE) ×1
GLOVE BIOGEL PI INDICATOR 7.5 (GLOVE) ×1
GLOVE BIOGEL PI INDICATOR 8 (GLOVE) ×1
GLOVE ECLIPSE 7.5 STRL STRAW (GLOVE) ×3 IMPLANT
GOWN STRL REUS W/ TWL LRG LVL3 (GOWN DISPOSABLE) ×3 IMPLANT
GOWN STRL REUS W/TWL LRG LVL3 (GOWN DISPOSABLE) ×6
KIT BASIN OR (CUSTOM PROCEDURE TRAY) ×2 IMPLANT
KIT ROOM TURNOVER OR (KITS) ×2 IMPLANT
NS IRRIG 1000ML POUR BTL (IV SOLUTION) ×2 IMPLANT
PAD ARMBOARD 7.5X6 YLW CONV (MISCELLANEOUS) ×2 IMPLANT
POUCH SPECIMEN RETRIEVAL 10MM (ENDOMECHANICALS) IMPLANT
SCISSORS LAP 5X35 DISP (ENDOMECHANICALS) ×2 IMPLANT
SET CHOLANGIOGRAPH 5 50 .035 (SET/KITS/TRAYS/PACK) ×2 IMPLANT
SET IRRIG TUBING LAPAROSCOPIC (IRRIGATION / IRRIGATOR) ×2 IMPLANT
SLEEVE ENDOPATH XCEL 5M (ENDOMECHANICALS) ×2 IMPLANT
SPECIMEN JAR SMALL (MISCELLANEOUS) ×2 IMPLANT
STRIP CLOSURE SKIN 1/2X4 (GAUZE/BANDAGES/DRESSINGS) ×1 IMPLANT
SUT MNCRL AB 4-0 PS2 18 (SUTURE) ×2 IMPLANT
TOWEL OR 17X24 6PK STRL BLUE (TOWEL DISPOSABLE) ×2 IMPLANT
TOWEL OR 17X26 10 PK STRL BLUE (TOWEL DISPOSABLE) ×2 IMPLANT
TRAY LAPAROSCOPIC (CUSTOM PROCEDURE TRAY) ×2 IMPLANT
TROCAR XCEL BLUNT TIP 100MML (ENDOMECHANICALS) ×2 IMPLANT
TROCAR XCEL NON-BLD 11X100MML (ENDOMECHANICALS) IMPLANT
TROCAR XCEL NON-BLD 5MMX100MML (ENDOMECHANICALS) ×2 IMPLANT

## 2013-08-15 NOTE — Progress Notes (Signed)
Nurse called Dr. Linna Caprice and informed him of patients blood pressure being 191/129 in right arm and then it was rechecked in the left arm and it was 181/123. Patient stated "I took my blood pressure medicine this morning at 0530, but sometimes my blood pressure runs high like that." Dr. Linna Caprice instructed Nurse to recheck blood pressure in 30 minutes.

## 2013-08-15 NOTE — Anesthesia Procedure Notes (Signed)
Procedure Name: Intubation Date/Time: 08/15/2013 8:35 AM Performed by: Kyung Rudd Pre-anesthesia Checklist: Patient identified, Emergency Drugs available, Suction available, Patient being monitored and Timeout performed Patient Re-evaluated:Patient Re-evaluated prior to inductionOxygen Delivery Method: Circle system utilized Preoxygenation: Pre-oxygenation with 100% oxygen Intubation Type: IV induction Ventilation: Mask ventilation without difficulty Laryngoscope Size: Mac and 3 Grade View: Grade I Tube type: Oral Tube size: 7.0 mm Number of attempts: 1 Airway Equipment and Method: Stylet Placement Confirmation: ETT inserted through vocal cords under direct vision,  positive ETCO2 and breath sounds checked- equal and bilateral Secured at: 21 cm Tube secured with: Tape Dental Injury: Teeth and Oropharynx as per pre-operative assessment

## 2013-08-15 NOTE — Op Note (Addendum)
OPERATIVE REPORT  DATE OF OPERATION: 08/15/2013  PATIENT:  Kristen [name]  48 y.o. female  PRE-OPERATIVE DIAGNOSIS:  symptomatic cholelithiasis  POST-OPERATIVE DIAGNOSIS:  symptomatic cholelithiasis  PROCEDURE:  Procedure(s): LAPAROSCOPIC CHOLECYSTECTOMY WITH INTRAOPERATIVE CHOLANGIOGRAM  SURGEON:  Surgeon(s): Gwenyth Ober, MD  ASSISTANT: None  ANESTHESIA:   general  EBL: <30 ml  BLOOD ADMINISTERED: none  DRAINS: none   SPECIMEN:  Source of Specimen:  Gallbladder ans stones  COUNTS CORRECT:  YES  PROCEDURE DETAILS: The patient was taken to the operating room and placed on the table in the supine position.  After an adequate endotracheal anesthetic was administered, the patient was prepped with ChloroPrep, and then draped in the usual manner exposing the entire abdomen laterally, inferiorly and up  to the costal margins.  After a proper timeout was performed including identifying the patient and the procedure to be performed, a Supraumbilical 1.2WP midline incision was made using a #15 blade.  This was taken down to the fascia which was then incised with a #15 blade.  The edges of the fascia were tented up with Kocher clamps as the preperitoneal space was penetrated with a Kelly clamp into the peritoneum.  Once this was done, a pursestring suture of 0 Vicryl was passed around the fascial opening.  This was subsequently used to secure the [facility] cannula which was passed into the peritoneal cavity.  Once the [facility] cannula was in place, carbon dioxide gas was insufflated into the peritoneal cavity up to a maximal intra-abdominal pressure of 66m Hg.The laparoscope, with attached camera and light source, was passed into the peritoneal cavity to visualize the direct insertion of two right upper quadrant 559mcannulas, and a sup-xiphoid 69m22mannula.  Once all cannulas were in place, the dissection was begun.  Two ratcheted graspers were attached to the dome and infundibulum of the  gallbladder and retracted towards the anterior abdominal wall and the right upper quadrant.  Using cautery attached to a dissecting forceps, the peritoneum overlaying the triangle of Chalot and the hepatoduodenal triangle was dissected away exposing the cystic duct and the cystic artery.  The cystic artery was clipped proximally and distally then transected.  A clip was placed on the gallbladder side of the cystic duct, then a cholecystodochotomy made using the laparoscopic scissors.  Through the cholecystodochotomy a Cook catheter was passed to performed a cholangiogram.  The cholangiogram showed good flow into the duodenum, good proximal filling, no intraductal filling defects, and no dilatation..  Once the cholangiogram was completed, the [facility], and the distal cystic duct was clipped multiple times then transected.  The gallbladder was then dissected out of the hepatic bed without event.  It was retrieved from the abdomen (using an EndoCatch bag) without event.  Once the gallbladder was removed, the bed was inspected for hemostasis.  Once excellent hemostasis was obtained all gas and fluids were aspirated from above the liver, then the cannulas were removed.  The supraumbilical incision was closed using the pursestring suture which was in place.  0.25% bupivicaine with epinephrine was injected at all sites.  All 43m169m greater cannula sites were close using a running subcuticular stitch of 4-0 Monocryl.  5.0mm 40mnula sites were closed with Dermabond only.Steri-Strips and Tagaderm were used to complete the dressings at all sites.  At this point all needle, sponge, and instrument counts were correct.The patient was awakened from anesthesia and taken to the PACU in stable condition.  PATIENT DISPOSITION:  PACU - hemodynamically  stable.   Gwenyth Ober 7/29/20159:41 AM

## 2013-08-15 NOTE — Anesthesia Postprocedure Evaluation (Signed)
Anesthesia Post Note  Patient: Kristen [name]  Procedure(s) Performed: Procedure(s) (LRB): LAPAROSCOPIC CHOLECYSTECTOMY WITH INTRAOPERATIVE CHOLANGIOGRAM (N/A)  Anesthesia type: General  Patient location: PACU  Post pain: Pain level controlled and Adequate analgesia  Post assessment: Post-op Vital signs reviewed, Patient's Cardiovascular Status Stable, Respiratory Function Stable, Patent Airway and Pain level controlled  Last Vitals:  Filed Vitals:   08/15/13 1115  BP:   Pulse: 68  Temp:   Resp: 16    Post vital signs: Reviewed and stable  Level of consciousness: awake, alert  and oriented  Complications: No apparent anesthesia complications

## 2013-08-15 NOTE — H&P (Signed)
Chief Complaint   Patient presents with   .  eval gallstones   HPI  Kristen [name] is a 48 y.o. female. She comes in for evaluation for symptomatic cholelithiasis.  HPI  Apparently the patient has been having some symptoms since December of 2014. At that time she was admitted for nausea vomiting and diarrhea. She ended up getting a colonoscopy but no definitive source of the diarrhea was found.  She currently has urgent episodes of stool incontinence are brought on by having to hold her bowels and a urine for longer time. The only way she can control her diarrhea is to take Imodium right away. Along with that she get some upper abdominal tenderness especially in the epigastrium that radiates directly back to her back. This is associated with nausea and occasional vomiting. She's had no fevers or chills. She's had no noted jaundice. It seems as her problem is exacerbated by eating certain foods specifically a greasy or oily foods. She avoids spicy foods because of a history of a hiatal hernia.  Past Medical History   Diagnosis  Date   .  Hypertension    .  Scoliosis    .  Anemia     Past Surgical History   Procedure  Laterality  Date   .  Hernia repair     .  Back surgery     .  Tubal ligation     .  Tonsillectomy     .  Colonoscopy  N/A  01/09/2013     Procedure: COLONOSCOPY; Surgeon: Beryle Beams, MD; Location: [place]; Service: Endoscopy; Laterality: N/A;    Family History   Problem  Relation  Age of Onset   .  Heart disease  Father    Social History  History   Substance Use Topics   .  Smoking status:  Never Smoker   .  Smokeless tobacco:  Never Used   .  Alcohol Use:  Yes    Allergies   Allergen  Reactions   .  Other  Hives and Swelling     tomato   .  Sulfur  Hives and Swelling    Current Outpatient Prescriptions   Medication  Sig  Dispense  Refill   .  esomeprazole (NEXIUM) 40 MG capsule      .  ibuprofen (ADVIL,MOTRIN) 200 MG tablet  Take 400 mg by mouth  every 6 (six) hours as needed.     Marland Kitchen  lisinopril (PRINIVIL,ZESTRIL) 10 MG tablet  Take 10 mg by mouth daily.      No current facility-administered medications for this visit.   Review of Systems  Review of Systems  Constitutional: Negative.  HENT: Negative.  Gastrointestinal: Positive for nausea, vomiting, abdominal pain and diarrhea.  All other systems reviewed and are negative.  Blood pressure 140/82, pulse 82, temperature 97.8 F (36.6 C), height 5' 9"  (1.753 m), weight 254 lb (115.214 kg).  Physical Exam  Physical Exam  Constitutional: She is oriented to person, place, and time. She appears well-developed.  Obese  HENT:  Head: Normocephalic and atraumatic.  Eyes: Conjunctivae and EOM are normal. Pupils are equal, round, and reactive to light.  Neck: Normal range of motion. Neck supple.  Cardiovascular: Normal rate, regular rhythm, normal heart sounds and intact distal pulses.  Pulmonary/Chest: Effort normal and breath sounds normal.  Abdominal: Soft. Normal appearance and bowel sounds are normal. There is tenderness in the epigastric area. There is no rigidity, no rebound and no  guarding.  Musculoskeletal: Normal range of motion.  Neurological: She is alert and oriented to person, place, and time. She has normal reflexes.  Skin: Skin is warm and dry.  Psychiatric: She has a normal mood and affect. Her behavior is normal. Judgment and thought content normal.  Data Reviewed  I have reviewed the patient's recent ultrasound report.  Assessment  Symptomatic cholelithiasis with diarrhea and abdominal pain.  Plan  I have discussed with the patient risk and benefits of surgery and she wishes to proceed with a laparoscopic cholecystectomy.  The patient will receive preoperative antibiotics. Orders will be placed.  Currently the patient's BP is significantly elevated to 191/129.  Will get anesthesia to see the patient and get BP down.

## 2013-08-15 NOTE — Anesthesia Preprocedure Evaluation (Addendum)
Anesthesia Evaluation  Patient identified by MRN, date of birth, ID band Patient awake    Reviewed: Allergy & Precautions, H&P , NPO status , Patient's Chart, lab work & pertinent test results  Airway Mallampati: II  Neck ROM: full    Dental  (+) Edentulous Upper, Partial Lower   Pulmonary shortness of breath,          Cardiovascular hypertension, Pt. on medications     Neuro/Psych  Headaches,    GI/Hepatic hiatal hernia, GERD-  Medicated and Controlled,  Endo/Other  Morbid obesity  Renal/GU      Musculoskeletal   Abdominal   Peds  Hematology   Anesthesia Other Findings   Reproductive/Obstetrics                         Anesthesia Physical Anesthesia Plan  ASA: II  Anesthesia Plan: General   Post-op Pain Management:    Induction: Intravenous  Airway Management Planned: Oral ETT  Additional Equipment:   Intra-op Plan:   Post-operative Plan: Extubation in OR  Informed Consent: I have reviewed the patients History and Physical, chart, labs and discussed the procedure including the risks, benefits and alternatives for the proposed anesthesia with the patient or authorized representative who has indicated his/her understanding and acceptance.     Plan Discussed with: CRNA, Anesthesiologist and Surgeon  Anesthesia Plan Comments:         Anesthesia Quick Evaluation

## 2013-08-15 NOTE — Transfer of Care (Signed)
Immediate Anesthesia Transfer of Care Note  Patient: Kristen [name]  Procedure(s) Performed: Procedure(s): LAPAROSCOPIC CHOLECYSTECTOMY WITH INTRAOPERATIVE CHOLANGIOGRAM (N/A)  Patient Location: PACU  Anesthesia Type:General  Level of Consciousness: awake, alert  and oriented  Airway & Oxygen Therapy: Patient Spontanous Breathing and Patient connected to nasal cannula oxygen  Post-op Assessment: Report given to PACU RN, Post -op Vital signs reviewed and stable and Patient moving all extremities X 4  Post vital signs: Reviewed and stable  Complications: No apparent anesthesia complications

## 2013-08-15 NOTE — Discharge Instructions (Signed)
Laparoscopic Cholecystectomy, Care After Refer to this sheet in the next few weeks. These instructions provide you with information on caring for yourself after your procedure. Your health care provider may also give you more specific instructions. Your treatment has been planned according to current medical practices, but problems sometimes occur. Call your health care provider if you have any problems or questions after your procedure. WHAT TO EXPECT AFTER THE PROCEDURE After your procedure, it is typical to have the following:  Pain at your incision sites. You will be given pain medicines to control the pain.  Mild nausea or vomiting. This should improve after the first 24 hours.  Bloating and possibly shoulder pain from the gas used during the procedure. This will improve after the first 24 hours. HOME CARE INSTRUCTIONS   Change bandages (dressings) as directed by your health care provider.  Keep the wound dry and clean. You may wash the wound gently with soap and water. Gently blot or dab the area dry.  Do not take baths or use swimming pools or hot tubs for 2 weeks or until your health care provider approves.  Only take over-the-counter or prescription medicines as directed by your health care provider.  Continue your normal diet as directed by your health care provider.  Do not lift anything heavier than 10 pounds (4.5 kg) until your health care provider approves.  Do not play contact sports for 1 week or until your health care provider approves. SEEK MEDICAL CARE IF:   You have redness, swelling, or increasing pain in the wound.  You notice yellowish-white fluid (pus) coming from the wound.  You have drainage from the wound that lasts longer than 1 day.  You notice a bad smell coming from the wound or dressing.  Your surgical cuts (incisions) break open. SEEK IMMEDIATE MEDICAL CARE IF:   You develop a rash.  You have difficulty breathing.  You have chest pain.  You  have a fever.  You have increasing pain in the shoulders (shoulder strap areas).  You have dizzy episodes or faint while standing.  You have severe abdominal pain.  You feel sick to your stomach (nauseous) or throw up (vomit) and this lasts for more than 1 day. Document Released: 01/04/2005 Document Revised: 10/25/2012 Document Reviewed: 08/16/2012 [facility] Patient Information 2015 [place], [place]. This information is not intended to replace advice given to you by your health care provider. Make sure you discuss any questions you have with your health care provider.

## 2013-08-15 NOTE — Progress Notes (Signed)
10 mg of IV Labetalol given IV as verbally ordered by Dr. Marcie Bal.

## 2013-08-16 ENCOUNTER — Encounter (HOSPITAL_COMMUNITY): Payer: Self-pay | Admitting: General Surgery

## 2013-08-17 ENCOUNTER — Telehealth (INDEPENDENT_AMBULATORY_CARE_PROVIDER_SITE_OTHER): Payer: Self-pay

## 2013-08-17 NOTE — Telephone Encounter (Signed)
Patient states she had small amount of  blood at umbilical area. She has been picking up a 9lb baby. Advised her to try to keep from bending and picking up the baby . To call if bleeding reoccurs . Keep area clean and dry. She is needing p/o appt advised her Selinda Eon will call next week with appt with DR. Hulen Skains

## 2013-08-22 ENCOUNTER — Encounter (HOSPITAL_COMMUNITY): Payer: Self-pay | Admitting: Emergency Medicine

## 2013-08-22 ENCOUNTER — Emergency Department (HOSPITAL_COMMUNITY)
Admission: EM | Admit: 2013-08-22 | Discharge: 2013-08-22 | Disposition: A | Discharge status: Home / Self Care | Payer: BC Managed Care – PPO | Source: Home / Self Care

## 2013-08-22 DIAGNOSIS — B029 Zoster without complications: Secondary | ICD-10-CM

## 2013-08-22 DIAGNOSIS — I10 Essential (primary) hypertension: Secondary | ICD-10-CM

## 2013-08-22 MED ORDER — LIDOCAINE 5 % EX PTCH: 1.0000 | MEDICATED_PATCH | CUTANEOUS | Status: DC

## 2013-08-22 MED ORDER — OXYCODONE-ACETAMINOPHEN 5-325 MG PO TABS: 1.0000 | ORAL_TABLET | Freq: Four times a day (QID) | ORAL | Status: DC | PRN

## 2013-08-22 NOTE — ED Provider Notes (Signed)
Medical screening examination/treatment/procedure(s) were performed by a resident physician or non-physician practitioner and as the supervising physician I was immediately available for consultation/collaboration.  Linna Darner, MD Family Medicine   Waldemar Dickens, MD 08/22/13 [phone]

## 2013-08-22 NOTE — ED Notes (Signed)
Reports blistery rash to right flank.  On set Thursday.  Hx of chicken pox as a child.  States having sharp pain and irritation.   Pt has tried benadryl with no relief.

## 2013-08-22 NOTE — Discharge Instructions (Signed)
Hypertension Hypertension, commonly called high blood pressure, is when the force of blood pumping through your arteries is too strong. Your arteries are the blood vessels that carry blood from your heart throughout your body. A blood pressure reading consists of a higher number over a lower number, such as 110/72. The higher number (systolic) is the pressure inside your arteries when your heart pumps. The lower number (diastolic) is the pressure inside your arteries when your heart relaxes. Ideally you want your blood pressure below 120/80. Hypertension forces your heart to work harder to pump blood. Your arteries may become narrow or stiff. Having hypertension puts you at risk for heart disease, stroke, and other problems.  RISK FACTORS Some risk factors for high blood pressure are controllable. Others are not.  Risk factors you cannot control include:   Race. You may be at higher risk if you are African American.  Age. Risk increases with age.  Gender. Men are at higher risk than women before age 59 years. After age 60, women are at higher risk than men. Risk factors you can control include:  Not getting enough exercise or physical activity.  Being overweight.  Getting too much fat, sugar, calories, or salt in your diet.  Drinking too much alcohol. SIGNS AND SYMPTOMS Hypertension does not usually cause signs or symptoms. Extremely high blood pressure (hypertensive crisis) may cause headache, anxiety, shortness of breath, and nosebleed. DIAGNOSIS  To check if you have hypertension, your health care provider will measure your blood pressure while you are seated, with your arm held at the level of your heart. It should be measured at least twice using the same arm. Certain conditions can cause a difference in blood pressure between your right and left arms. A blood pressure reading that is higher than normal on one occasion does not mean that you need treatment. If one blood pressure reading  is high, ask your health care provider about having it checked again. TREATMENT  Treating high blood pressure includes making lifestyle changes and possibly taking medicine. Living a healthy lifestyle can help lower high blood pressure. You may need to change some of your habits. Lifestyle changes may include:  Following the DASH diet. This diet is high in fruits, vegetables, and whole grains. It is low in salt, red meat, and added sugars.  Getting at least 2 hours of brisk physical activity every week.  Losing weight if necessary.  Not smoking.  Limiting alcoholic beverages.  Learning ways to reduce stress. If lifestyle changes are not enough to get your blood pressure under control, your health care provider may prescribe medicine. You may need to take more than one. Work closely with your health care provider to understand the risks and benefits. HOME CARE INSTRUCTIONS  Have your blood pressure rechecked as directed by your health care provider.   Take medicines only as directed by your health care provider. Follow the directions carefully. Blood pressure medicines must be taken as prescribed. The medicine does not work as well when you skip doses. Skipping doses also puts you at risk for problems.   Do not smoke.   Monitor your blood pressure at home as directed by your health care provider. SEEK MEDICAL CARE IF:   You think you are having a reaction to medicines taken.  You have recurrent headaches or feel dizzy.  You have swelling in your ankles.  You have trouble with your vision. SEEK IMMEDIATE MEDICAL CARE IF:  You develop a severe headache or confusion.  You have unusual weakness, numbness, or feel faint.  You have severe chest or abdominal pain.  You vomit repeatedly.  You have trouble breathing. MAKE SURE YOU:   Understand these instructions.  Will watch your condition.  Will get help right away if you are not doing well or get worse. Document  Released: 01/04/2005 Document Revised: 05/21/2013 Document Reviewed: 10/27/2012 [facility] Patient Information 2015 [place], [place]. This information is not intended to replace advice given to you by your health care provider. Make sure you discuss any questions you have with your health care provider.  Shingles Shingles (herpes zoster) is an infection that is caused by the same virus that causes chickenpox (varicella). The infection causes a painful skin rash and fluid-filled blisters, which eventually break open, crust over, and heal. It may occur in any area of the body, but it usually affects only one side of the body or face. The pain of shingles usually lasts about 1 month. However, some people with shingles may develop long-term (chronic) pain in the affected area of the body. Shingles often occurs many years after the person had chickenpox. It is more common:  In people older than 50 years.  In people with weakened immune systems, such as those with HIV, AIDS, or cancer.  In people taking medicines that weaken the immune system, such as transplant medicines.  In people under great stress. CAUSES  Shingles is caused by the varicella zoster virus (VZV), which also causes chickenpox. After a person is infected with the virus, it can remain in the person's body for years in an inactive state (dormant). To cause shingles, the virus reactivates and breaks out as an infection in a nerve root. The virus can be spread from person to person (contagious) through contact with open blisters of the shingles rash. It will only spread to people who have not had chickenpox. When these people are exposed to the virus, they may develop chickenpox. They will not develop shingles. Once the blisters scab over, the person is no longer contagious and cannot spread the virus to others. SIGNS AND SYMPTOMS  Shingles shows up in stages. The initial symptoms may be pain, itching, and tingling in an area of the skin. This  pain is usually described as burning, stabbing, or throbbing.In a few days or weeks, a painful red rash will appear in the area where the pain, itching, and tingling were felt. The rash is usually on one side of the body in a band or belt-like pattern. Then, the rash usually turns into fluid-filled blisters. They will scab over and dry up in approximately 2-3 weeks. Flu-like symptoms may also occur with the initial symptoms, the rash, or the blisters. These may include:  Fever.  Chills.  Headache.  Upset stomach. DIAGNOSIS  Your health care provider will perform a skin exam to diagnose shingles. Skin scrapings or fluid samples may also be taken from the blisters. This sample will be examined under a microscope or sent to a lab for further testing. TREATMENT  There is no specific cure for shingles. Your health care provider will likely prescribe medicines to help you manage the pain, recover faster, and avoid long-term problems. This may include antiviral drugs, anti-inflammatory drugs, and pain medicines. HOME CARE INSTRUCTIONS   Take a cool bath or apply cool compresses to the area of the rash or blisters as directed. This may help with the pain and itching.   Take medicines only as directed by your health care provider.  Rest as directed by your health care provider.  Keep your rash and blisters clean with mild soap and cool water or as directed by your health care provider.  Do not pick your blisters or scratch your rash. Apply an anti-itch cream or numbing creams to the affected area as directed by your health care provider.  Keep your shingles rash covered with a loose bandage (dressing).  Avoid skin contact with:  Babies.   Pregnant women.   Children with eczema.   Elderly people with transplants.   People with chronic illnesses, such as leukemia or AIDS.   Wear loose-fitting clothing to help ease the pain of material rubbing against the rash.  Keep all  follow-up visits as directed by your health care provider.If the area involved is on your face, you may receive a referral for a specialist, such as an eye doctor (ophthalmologist) or an ear, nose, and throat (ENT) doctor. Keeping all follow-up visits will help you avoid eye problems, chronic pain, or disability.  SEEK IMMEDIATE MEDICAL CARE IF:   You have facial pain, pain around the eye area, or loss of feeling on one side of your face.  You have ear pain or ringing in your ear.  You have loss of taste.  Your pain is not relieved with prescribed medicines.   Your redness or swelling spreads.   You have more pain and swelling.  Your condition is worsening or has changed.   You have a fever. MAKE SURE YOU:  Understand these instructions.  Will watch your condition.  Will get help right away if you are not doing well or get worse. Document Released: 01/04/2005 Document Revised: 05/21/2013 Document Reviewed: 08/19/2011 [facility] Patient Information 2015 [place], [place]. This information is not intended to replace advice given to you by your health care provider. Make sure you discuss any questions you have with your health care provider.  Neuropathic Pain We often think that pain has a physical cause. If we get rid of the cause, the pain should go away. Nerves themselves can also cause pain. It is called neuropathic pain, which means nerve abnormality. It may be difficult for the patients who have it and for the treating caregivers. Pain is usually described as acute (short-lived) or chronic (long-lasting). Acute pain is related to the physical sensations caused by an injury. It can last from a few seconds to many weeks, but it usually goes away when normal healing occurs. Chronic pain lasts beyond the typical healing time. With neuropathic pain, the nerve fibers themselves may be damaged or injured. They then send incorrect signals to other pain centers. The pain you feel is real,  but the cause is not easy to find.  CAUSES  Chronic pain can result from diseases, such as diabetes and shingles (an infection related to chickenpox), or from trauma, surgery, or amputation. It can also happen without any known injury or disease. The nerves are sending pain messages, even though there is no identifiable cause for such messages.   Other common causes of neuropathy include diabetes, phantom limb pain, or Regional Pain Syndrome (RPS).  As with all forms of chronic back pain, if neuropathy is not correctly treated, there can be a number of associated problems that lead to a downward cycle for the patient. These include depression, sleeplessness, feelings of fear and anxiety, limited social interaction and inability to do normal daily activities or work.  The most dramatic and mysterious example of neuropathic pain is called "phantom limb syndrome."  This occurs when an arm or a leg has been removed because of illness or injury. The brain still gets pain messages from the nerves that originally carried impulses from the missing limb. These nerves now seem to misfire and cause troubling pain.  Neuropathic pain often seems to have no cause. It responds poorly to standard pain treatment. Neuropathic pain can occur after:  Shingles (herpes zoster virus infection).  A lasting burning sensation of the skin, caused usually by injury to a peripheral nerve.  Peripheral neuropathy which is widespread nerve damage, often caused by diabetes or alcoholism.  Phantom limb pain following an amputation.  Facial nerve problems (trigeminal neuralgia).  Multiple sclerosis.  Reflex sympathetic dystrophy.  Pain which comes with cancer and cancer chemotherapy.  Entrapment neuropathy such as when pressure is put on a nerve such as in carpal tunnel syndrome.  Back, leg, and hip problems (sciatica).  Spine or back surgery.  HIV Infection or AIDS where nerves are infected by viruses. Your  caregiver can explain items in the above list which may apply to you. SYMPTOMS  Characteristics of neuropathic pain are:  Severe, sharp, electric shock-like, shooting, lightening-like, knife-like.  Pins and needles sensation.  Deep burning, deep cold, or deep ache.  Persistent numbness, tingling, or weakness.  Pain resulting from light touch or other stimulus that would not usually cause pain.  Increased sensitivity to something that would normally cause pain, such as a pinprick. Pain may persist for months or years following the healing of damaged tissues. When this happens, pain signals no longer sound an alarm about current injuries or injuries about to happen. Instead, the alarm system itself is not working correctly.  Neuropathic pain may get worse instead of better over time. For some people, it can lead to serious disability. It is important to be aware that severe injury in a limb can occur without a proper, protective pain response.Burns, cuts, and other injuries may go unnoticed. Without proper treatment, these injuries can become infected or lead to further disability. Take any injury seriously, and consult your caregiver for treatment. DIAGNOSIS  When you have a pain with no known cause, your caregiver will probably ask some specific questions:   Do you have any other conditions, such as diabetes, shingles, multiple sclerosis, or HIV infection?  How would you describe your pain? (Neuropathic pain is often described as shooting, stabbing, burning, or searing.)  Is your pain worse at any time of the day? (Neuropathic pain is usually worse at night.)  Does the pain seem to follow a certain physical pathway?  Does the pain come from an area that has missing or injured nerves? (An example would be phantom limb pain.)  Is the pain triggered by minor things such as rubbing against the sheets at night? These questions often help define the type of pain involved. Once your  caregiver knows what is happening, treatment can begin. Anticonvulsant, antidepressant drugs, and various pain relievers seem to work in some cases. If another condition, such as diabetes is involved, better management of that disorder may relieve the neuropathic pain.  TREATMENT  Neuropathic pain is frequently long-lasting and tends not to respond to treatment with narcotic type pain medication. It may respond well to other drugs such as antiseizure and antidepressant medications. Usually, neuropathic problems do not completely go away, but partial improvement is often possible with proper treatment. Your caregivers have large numbers of medications available to treat you. Do not be discouraged if you do not get  immediate relief. Sometimes different medications or a combination of medications will be tried before you receive the results you are hoping for. See your caregiver if you have pain that seems to be coming from nowhere and does not go away. Help is available.  SEEK IMMEDIATE MEDICAL CARE IF:   There is a sudden change in the quality of your pain, especially if the change is on only one side of the body.  You notice changes of the skin, such as redness, black or purple discoloration, swelling, or an ulcer.  You cannot move the affected limbs. Document Released: 10/02/2003 Document Revised: 03/29/2011 Document Reviewed: 10/02/2003 [facility] Patient Information 2015 [place], [place]. This information is not intended to replace advice given to you by your health care provider. Make sure you discuss any questions you have with your health care provider.

## 2013-08-22 NOTE — ED Provider Notes (Signed)
CSN: [id number]     Arrival date & time 08/22/13  0807 History   First MD Initiated Contact with Patient 08/22/13 [phone]     Chief Complaint  Patient presents with  . Rash   (Consider location/radiation/quality/duration/timing/severity/associated sxs/prior Treatment) HPI Comments: Approx 8 d ago developed pain right lateral chest/abdomen f/b an ovoid crop of papulovessiclular rash the following d. The rash and surrounding area burning and painful.   Past Medical History  Diagnosis Date  . Hypertension   . Scoliosis   . Anemia   . Headache(784.0)   . H/O hiatal hernia   . Shortness of breath     with exertion  . History of kidney stones     1996ish  . GERD (gastroesophageal reflux disease)    Past Surgical History  Procedure Laterality Date  . Tonsillectomy    . Colonoscopy N/A 01/09/2013    Procedure: COLONOSCOPY;  Surgeon: Beryle Beams, MD;  Location: [facility];  Service: Endoscopy;  Laterality: N/A;  . Back surgery  07/21/1978  . Hernia repair Left 1981  . Tubal ligation  06/1988  . Cholecystectomy N/A 08/15/2013    Procedure: LAPAROSCOPIC CHOLECYSTECTOMY WITH INTRAOPERATIVE CHOLANGIOGRAM;  Surgeon: Gwenyth Ober, MD;  Location: [facility];  Service: General;  Laterality: N/A;   Family History  Problem Relation Age of Onset  . Heart disease Father    History  Substance Use Topics  . Smoking status: Never Smoker   . Smokeless tobacco: Never Used  . Alcohol Use: 0.6 oz/week    1 Cans of beer per week   OB History   Grav Para Term Preterm Abortions TAB SAB Ect Mult Living                 Review of Systems  Constitutional: Positive for activity change. Negative for fever and fatigue.  Eyes: Negative for pain, discharge, redness, itching and visual disturbance.  Respiratory: Negative.   Skin: Positive for rash.  Neurological: Negative.     Allergies  Other and Sulfur  Home Medications   Prior to Admission medications   Medication Sig Start Date End Date Taking?  Authorizing Provider  esomeprazole (NEXIUM) 40 MG capsule Take 40 mg by mouth daily.  04/23/13  Yes Historical Provider, MD  lisinopril (PRINIVIL,ZESTRIL) 10 MG tablet Take 10 mg by mouth daily.   Yes Historical Provider, MD  Bismuth Subsalicylate 102 MG TABS Take 2 tablets by mouth every 8 (eight) hours.    Historical Provider, MD  diclofenac (VOLTAREN) 75 MG EC tablet Take 75 mg by mouth 2 (two) times daily as needed.    Historical Provider, MD  HYDROcodone-acetaminophen (NORCO) 5-325 MG per tablet Take 1-2 tablets by mouth every 6 (six) hours as needed for moderate pain or severe pain. 08/15/13   Gwenyth Ober, MD  ibuprofen (ADVIL,MOTRIN) 200 MG tablet Take 200-400 mg by mouth every 6 (six) hours as needed for fever, headache, mild pain, moderate pain or cramping.     Historical Provider, MD  lidocaine (LIDODERM) 5 % Place 1 patch onto the skin daily. Remove & Discard patch within 12 hours or as directed by MD 08/22/13   Janne Napoleon, NP  oxyCODONE-acetaminophen (PERCOCET/ROXICET) 5-325 MG per tablet Take 1-2 tablets by mouth every 6 (six) hours as needed for severe pain. 08/22/13   Janne Napoleon, NP  tiZANidine (ZANAFLEX) 4 MG tablet Take 4 mg by mouth 2 (two) times daily as needed for muscle spasms.    Historical Provider, MD  BP 168/105  Pulse 74  Temp(Src) 98.5 F (36.9 C) (Oral)  Resp 18  SpO2 96%  LMP 08/09/2013 Physical Exam  Nursing note and vitals reviewed. Constitutional: She is oriented to person, place, and time. She appears well-developed and well-nourished. No distress.  Eyes: Conjunctivae and EOM are normal.  Neck: Normal range of motion. Neck supple.  Cardiovascular: Normal rate.   Pulmonary/Chest: Effort normal. No respiratory distress.  Abdominal:  Scar points form recent cholecystectomy.  Ovoid crop of papulovesiclular lesions over a red base, tender and papinful, within the T9 dermatome. No drainage, bleeding.  Neurological: She is alert and oriented to person, place, and  time.  Skin: Skin is warm and dry.  Psychiatric: She has a normal mood and affect.    ED Course  Procedures (including critical care time) Labs Review Labs Reviewed - No data to display  Imaging Review No results found.   MDM   1. Herpes zoster   2. Essential hypertension     8 days since onset of pain.  Percocet 5 mg #20 and Lidoderm patches. F/U wit PCP re HTN and f/u of shingle sx's    Janne Napoleon, NP 08/22/13 [phone]

## 2013-08-23 ENCOUNTER — Telehealth (INDEPENDENT_AMBULATORY_CARE_PROVIDER_SITE_OTHER): Payer: Self-pay

## 2013-08-23 NOTE — Telephone Encounter (Signed)
Pt called in stating she has a rash on right side abdominal under her breast. The rash started the day after surgery and is still painful. After reviewing her chart she was seen in ED for shingles in the same area. Ask pt what the ED told her and she states she was just given a lidocaine patch and pain medicine. She is asking if their is anything else she can take to help control the rash and pain. Advised we do not treat shingles and that she needs to cal her PCP and ask them. Pt understanding and agrees to POC.

## 2013-08-27 ENCOUNTER — Telehealth (INDEPENDENT_AMBULATORY_CARE_PROVIDER_SITE_OTHER): Payer: Self-pay

## 2013-08-27 NOTE — Telephone Encounter (Signed)
Called pt with appt

## 2013-08-27 NOTE — Telephone Encounter (Signed)
Message copied by Carlene Coria on Mon Aug 27, 2013 10:04 AM ------      Message from: Joya San      Created: Mon Aug 27, 2013  9:22 AM      Contact: [phone]       Pt had sx she needs a post op appt. Please call ------

## 2013-08-28 ENCOUNTER — Encounter (INDEPENDENT_AMBULATORY_CARE_PROVIDER_SITE_OTHER): Payer: Self-pay | Admitting: General Surgery

## 2013-08-28 ENCOUNTER — Ambulatory Visit (INDEPENDENT_AMBULATORY_CARE_PROVIDER_SITE_OTHER): Payer: BC Managed Care – PPO | Admitting: General Surgery

## 2013-08-28 ENCOUNTER — Encounter (INDEPENDENT_AMBULATORY_CARE_PROVIDER_SITE_OTHER): Payer: Self-pay

## 2013-08-28 VITALS — BP 170/120 | HR 76 | Resp 16 | Ht 69.0 in | Wt 263.8 lb

## 2013-08-28 DIAGNOSIS — B029 Zoster without complications: Secondary | ICD-10-CM

## 2013-08-28 DIAGNOSIS — Z09 Encounter for follow-up examination after completed treatment for conditions other than malignant neoplasm: Secondary | ICD-10-CM | POA: Insufficient documentation

## 2013-08-28 HISTORY — DX: Zoster without complications: B02.9

## 2013-08-28 NOTE — Progress Notes (Signed)
Subjective:     Patient ID: Kristen [name], female   DOB: 1965/03/20, 48 y.o.   MRN: [id number]  HPI The patient is doing well from a surgical standpoint but developed herpes zoster on her right side postoperatively. It is healing currently.  Review of Systems Shingles on the right flank with right side and back pain.    Objective:   Physical Exam On physical examination her wounds have healed well with the exception of her periumbilical incision which did open. It has an eschar on the surface with no drainage and no redness. He does not appear to be infected and she's been advised to just cover it with some triple antibiotic ointment once a day after shower and cover it with a sterile dressing.      Assessment:     Doing well postoperatively from laparoscopic cholecystectomy. The patient is eating well with minimal discomfort.     Plan:     She is cleared to go back to work from a surgical standpoint however she's been warned that if she has to take pain medicines that include narcotics for any reason then she is not cleared to work.

## 2013-10-08 ENCOUNTER — Ambulatory Visit (HOSPITAL_COMMUNITY)
Admission: RE | Admit: 2013-10-08 | Discharge: 2013-10-08 | Disposition: A | Discharge status: Home / Self Care | Payer: BC Managed Care – PPO | Source: Ambulatory Visit | Attending: Internal Medicine | Admitting: Internal Medicine

## 2013-10-08 ENCOUNTER — Other Ambulatory Visit (HOSPITAL_COMMUNITY): Payer: Self-pay | Admitting: Internal Medicine

## 2013-10-08 DIAGNOSIS — M4120 Other idiopathic scoliosis, site unspecified: Secondary | ICD-10-CM | POA: Insufficient documentation

## 2013-10-08 DIAGNOSIS — M545 Low back pain, unspecified: Secondary | ICD-10-CM | POA: Insufficient documentation

## 2013-11-06 ENCOUNTER — Other Ambulatory Visit (HOSPITAL_COMMUNITY): Payer: Self-pay | Admitting: Internal Medicine

## 2013-11-06 DIAGNOSIS — Z1231 Encounter for screening mammogram for malignant neoplasm of breast: Secondary | ICD-10-CM

## 2013-11-15 ENCOUNTER — Ambulatory Visit (HOSPITAL_COMMUNITY)
Admission: RE | Admit: 2013-11-15 | Discharge: 2013-11-15 | Disposition: A | Discharge status: Home / Self Care | Payer: BC Managed Care – PPO | Source: Ambulatory Visit | Attending: Internal Medicine | Admitting: Internal Medicine

## 2013-11-15 DIAGNOSIS — Z1231 Encounter for screening mammogram for malignant neoplasm of breast: Secondary | ICD-10-CM | POA: Insufficient documentation

## 2014-03-13 ENCOUNTER — Other Ambulatory Visit: Payer: Self-pay | Admitting: Specialist

## 2014-03-13 DIAGNOSIS — M542 Cervicalgia: Secondary | ICD-10-CM

## 2014-03-22 ENCOUNTER — Other Ambulatory Visit: Payer: Self-pay | Admitting: Specialist

## 2014-03-22 ENCOUNTER — Ambulatory Visit
Admission: RE | Admit: 2014-03-22 | Discharge: 2014-03-22 | Disposition: A | Discharge status: Home / Self Care | Payer: BC Managed Care – PPO | Source: Ambulatory Visit | Attending: Specialist | Admitting: Specialist

## 2014-03-22 ENCOUNTER — Other Ambulatory Visit: Payer: BC Managed Care – PPO

## 2014-03-22 VITALS — BP 136/78 | HR 66

## 2014-03-22 DIAGNOSIS — M5416 Radiculopathy, lumbar region: Secondary | ICD-10-CM

## 2014-03-22 DIAGNOSIS — M542 Cervicalgia: Secondary | ICD-10-CM

## 2014-03-22 MED ORDER — ONDANSETRON 8 MG PO TBDP
8.0000 mg | ORAL_TABLET | Freq: Once | ORAL | Status: AC
  Administered 2014-03-22: 8 mg via ORAL

## 2014-03-22 MED ORDER — MEPERIDINE HCL 100 MG/ML IJ SOLN
100.0000 mg | Freq: Once | INTRAMUSCULAR | Status: AC
  Administered 2014-03-22: 100 mg via INTRAMUSCULAR

## 2014-03-22 MED ORDER — IOHEXOL 300 MG/ML  SOLN
10.0000 mL | Freq: Once | INTRAMUSCULAR | Status: AC | PRN
  Administered 2014-03-22: 1 mL via INTRATHECAL

## 2014-03-22 MED ORDER — DIAZEPAM 5 MG PO TABS
10.0000 mg | ORAL_TABLET | Freq: Once | ORAL | Status: AC
  Administered 2014-03-22: 10 mg via ORAL

## 2014-03-22 MED ORDER — ONDANSETRON HCL 4 MG/2ML IJ SOLN
4.0000 mg | Freq: Once | INTRAMUSCULAR | Status: AC
  Administered 2014-03-22: 4 mg via INTRAMUSCULAR

## 2014-03-22 NOTE — Discharge Instructions (Addendum)
[facility] Discharge Instructions  1. Go home and rest quietly for the next 24 hours.  It is important to lie flat for the next 24 hours.  Get up only to go to the restroom.  You may lie in the bed or on a couch on your back, your stomach, your left side or your right side.  You may have one pillow under your head.  You may have pillows between your knees while you are on your side or under your knees while you are on your back.  2. DO NOT drive today.  Recline the seat as far back as it will go, while still wearing your seat belt, on the way home.  3. You may get up to go to the bathroom as needed.  You may sit up for 10 minutes to eat.  You may resume your normal diet and medications unless otherwise indicated.  Drink plenty of extra fluids today and tomorrow.  4. The incidence of a spinal headache with nausea and/or vomiting is about 5% (one in 20 patients).  If you develop a headache, lie flat and drink plenty of fluids until the headache goes away.  Caffeinated beverages may be helpful.  If you develop severe nausea and vomiting or a headache that does not go away with flat bed rest, call [phone].  5. You may resume normal activities after your 24 hours of bed rest is over; however, do not exert yourself strongly or do any heavy lifting tomorrow.  6. Call your physician for a follow-up appointment.   You may resume Tramadol on Saturday, March 23, 2014 after 3:00p.m.

## 2014-03-22 NOTE — Progress Notes (Signed)
Patient states she has been off Tramadol for the past four days.  Brita Romp, RN

## 2014-03-26 ENCOUNTER — Other Ambulatory Visit (HOSPITAL_COMMUNITY): Payer: Self-pay | Admitting: Specialist

## 2014-03-26 DIAGNOSIS — M545 Low back pain: Secondary | ICD-10-CM

## 2014-04-12 ENCOUNTER — Ambulatory Visit (HOSPITAL_COMMUNITY)
Admission: RE | Admit: 2014-04-12 | Discharge: 2014-04-12 | Disposition: A | Discharge status: Home / Self Care | Payer: BC Managed Care – PPO | Source: Ambulatory Visit | Attending: Specialist | Admitting: Specialist

## 2014-04-12 DIAGNOSIS — M419 Scoliosis, unspecified: Secondary | ICD-10-CM | POA: Insufficient documentation

## 2014-04-12 DIAGNOSIS — R531 Weakness: Secondary | ICD-10-CM | POA: Diagnosis not present

## 2014-04-12 DIAGNOSIS — M545 Low back pain: Secondary | ICD-10-CM

## 2014-10-16 ENCOUNTER — Other Ambulatory Visit: Payer: Self-pay

## 2014-10-16 DIAGNOSIS — Z1231 Encounter for screening mammogram for malignant neoplasm of breast: Secondary | ICD-10-CM

## 2014-11-18 ENCOUNTER — Ambulatory Visit
Admission: RE | Admit: 2014-11-18 | Discharge: 2014-11-18 | Disposition: A | Discharge status: Home / Self Care | Payer: BC Managed Care – PPO | Source: Ambulatory Visit

## 2014-11-18 DIAGNOSIS — Z1231 Encounter for screening mammogram for malignant neoplasm of breast: Secondary | ICD-10-CM

## 2014-11-22 ENCOUNTER — Other Ambulatory Visit: Payer: Self-pay | Admitting: Specialist

## 2014-11-22 DIAGNOSIS — M542 Cervicalgia: Secondary | ICD-10-CM

## 2014-12-01 ENCOUNTER — Ambulatory Visit
Admission: RE | Admit: 2014-12-01 | Discharge: 2014-12-01 | Disposition: A | Discharge status: Home / Self Care | Payer: BC Managed Care – PPO | Source: Ambulatory Visit | Attending: Specialist | Admitting: Specialist

## 2014-12-01 DIAGNOSIS — M542 Cervicalgia: Secondary | ICD-10-CM

## 2015-02-25 LAB — HM PAP SMEAR: HM PAP: NEGATIVE

## 2015-06-18 ENCOUNTER — Other Ambulatory Visit (INDEPENDENT_AMBULATORY_CARE_PROVIDER_SITE_OTHER): Payer: BC Managed Care – PPO

## 2015-06-18 ENCOUNTER — Encounter: Payer: Self-pay | Admitting: Family

## 2015-06-18 ENCOUNTER — Ambulatory Visit (INDEPENDENT_AMBULATORY_CARE_PROVIDER_SITE_OTHER): Payer: BC Managed Care – PPO | Admitting: Family

## 2015-06-18 VITALS — BP 158/104 | HR 86 | Temp 98.8°F | Resp 16 | Ht 69.0 in | Wt 258.4 lb

## 2015-06-18 DIAGNOSIS — I10 Essential (primary) hypertension: Secondary | ICD-10-CM

## 2015-06-18 DIAGNOSIS — G4719 Other hypersomnia: Secondary | ICD-10-CM

## 2015-06-18 DIAGNOSIS — D508 Other iron deficiency anemias: Secondary | ICD-10-CM

## 2015-06-18 LAB — IBC PANEL
Iron: 52 ug/dL (ref 42–145)
SATURATION RATIOS: 14.6 % — AB (ref 20.0–50.0)
Transferrin: 255 mg/dL (ref 212.0–360.0)

## 2015-06-18 LAB — COMPREHENSIVE METABOLIC PANEL
ALT: 18 U/L (ref 0–35)
AST: 15 U/L (ref 0–37)
Albumin: 4.1 g/dL (ref 3.5–5.2)
Alkaline Phosphatase: 92 U/L (ref 39–117)
BILIRUBIN TOTAL: 0.8 mg/dL (ref 0.2–1.2)
BUN: 13 mg/dL (ref 6–23)
CALCIUM: 9.1 mg/dL (ref 8.4–10.5)
CO2: 29 meq/L (ref 19–32)
CREATININE: 0.81 mg/dL (ref 0.40–1.20)
Chloride: 107 mEq/L (ref 96–112)
GFR: 96.24 mL/min (ref >60.00)
Glucose, Bld: 94 mg/dL (ref 70–99)
Potassium: 3.7 mEq/L (ref 3.5–5.1)
SODIUM: 142 meq/L (ref 135–145)
Total Protein: 6.7 g/dL (ref 6.0–8.3)

## 2015-06-18 LAB — CBC
HCT: 36.6 % (ref 36.0–46.0)
Hemoglobin: 11.9 g/dL — ABNORMAL LOW (ref 12.0–15.0)
MCHC: 32.6 g/dL (ref 30.0–36.0)
MCV: 83.6 fl (ref 78.0–100.0)
Platelets: 211 10*3/uL (ref 150.0–400.0)
RBC: 4.38 Mil/uL (ref 3.87–5.11)
RDW: 15.1 % (ref 11.5–15.5)
WBC: 7 10*3/uL (ref 4.0–10.5)

## 2015-06-18 LAB — FERRITIN: Ferritin: 32.2 ng/mL (ref 10.0–291.0)

## 2015-06-18 LAB — HEMOGLOBIN A1C: Hgb A1c MFr Bld: 5.6 % (ref 4.6–6.5)

## 2015-06-18 MED ORDER — LISINOPRIL 20 MG PO TABS: 20.0000 mg | ORAL_TABLET | Freq: Every day | ORAL | Status: DC

## 2015-06-18 MED ORDER — AMLODIPINE BESYLATE 10 MG PO TABS: 10.0000 mg | ORAL_TABLET | Freq: Every day | ORAL | Status: DC

## 2015-06-18 MED ORDER — LABETALOL HCL 100 MG PO TABS: 100.0000 mg | ORAL_TABLET | Freq: Two times a day (BID) | ORAL | Status: DC

## 2015-06-18 NOTE — Assessment & Plan Note (Signed)
Hypertension remains uncontrolled and above goal 140/90 with current regimen with questionable compliance. Discontinue lisinopril-hydrochlorothiazide. Start lisinopril and labetalol. Encouraged to monitor blood pressures at home and decrease sodium in diet. No symptoms of end organ damage and denies worse headache of life. Follow-up in 3 weeks or sooner with blood pressure readings.

## 2015-06-18 NOTE — Progress Notes (Signed)
Pre visit review using our clinic review tool, if applicable. No additional management support is needed unless otherwise documented below in the visit note. 
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
## 2015-06-18 NOTE — Patient Instructions (Addendum)
Thank you for choosing [facility].  Summary/Instructions:  Please stop taking the lisinopril-hydrochlorothiazide.  Please start taking lisinopril and labetalol.  Monitor your blood pressure at home.  Reduce sodium and fat intake.  Exercise as tolerated with a goal of 30 minutes of moderate level activity daily which can be a variety of different activities.  Your prescription(s) have been submitted to your pharmacy or been printed and provided for you. Please take as directed and contact our office if you believe you are having problem(s) with the medication(s) or have any questions.  Please stop by the lab on the basement level of the building for your blood work. Your results will be released to [place] (or called to you) after review, usually within 72 hours after test completion. If any changes need to be made, you will be notified at that same time.  If your symptoms worsen or fail to improve, please contact our office for further instruction, or in case of emergency go directly to the emergency room at the closest medical facility.   DASH Eating Plan DASH stands for "Dietary Approaches to Stop Hypertension." The DASH eating plan is a healthy eating plan that has been shown to reduce high blood pressure (hypertension). Additional health benefits may include reducing the risk of type 2 diabetes mellitus, heart disease, and stroke. The DASH eating plan may also help with weight loss. WHAT DO I NEED TO KNOW ABOUT THE DASH EATING PLAN? For the DASH eating plan, you will follow these general guidelines:  Choose foods with a percent daily value for sodium of less than 5% (as listed on the food label).  Use salt-free seasonings or herbs instead of table salt or sea salt.  Check with your health care provider or pharmacist before using salt substitutes.  Eat lower-sodium products, often labeled as "lower sodium" or "no salt added."  Eat fresh foods.  Eat more vegetables,  fruits, and low-fat dairy products.  Choose whole grains. Look for the word "whole" as the first word in the ingredient list.  Choose fish and skinless chicken or [place] more often than red meat. Limit fish, poultry, and meat to 6 oz (170 g) each day.  Limit sweets, desserts, sugars, and sugary drinks.  Choose heart-healthy fats.  Limit cheese to 1 oz (28 g) per day.  Eat more home-cooked food and less restaurant, buffet, and fast food.  Limit fried foods.  Cook foods using methods other than frying.  Limit canned vegetables. If you do use them, rinse them well to decrease the sodium.  When eating at a restaurant, ask that your food be prepared with less salt, or no salt if possible. WHAT FOODS CAN I EAT? Seek help from a dietitian for individual calorie needs. Grains Whole grain or whole wheat bread. Brown rice. Whole grain or whole wheat pasta. Quinoa, bulgur, and whole grain cereals. Low-sodium cereals. Corn or whole wheat flour tortillas. Whole grain cornbread. Whole grain crackers. Low-sodium crackers. Vegetables Fresh or frozen vegetables (raw, steamed, roasted, or grilled). Low-sodium or reduced-sodium tomato and vegetable juices. Low-sodium or reduced-sodium tomato sauce and paste. Low-sodium or reduced-sodium canned vegetables.  Fruits All fresh, canned (in natural juice), or frozen fruits. Meat and Other Protein Products Ground beef (85% or leaner), grass-fed beef, or beef trimmed of fat. Skinless chicken or [place]. Ground chicken or [place]. Pork trimmed of fat. All fish and seafood. Eggs. Dried beans, peas, or lentils. Unsalted nuts and seeds. Unsalted canned beans. Dairy Low-fat dairy products, such as  skim or 1% milk, 2% or reduced-fat cheeses, low-fat ricotta or cottage cheese, or plain low-fat yogurt. Low-sodium or reduced-sodium cheeses. Fats and Oils Tub margarines without trans fats. Light or reduced-fat mayonnaise and salad dressings (reduced sodium). Avocado.  Safflower, olive, or canola oils. Natural peanut or almond butter. Other Unsalted popcorn and pretzels. The items listed above may not be a complete list of recommended foods or beverages. Contact your dietitian for more options. WHAT FOODS ARE NOT RECOMMENDED? Grains White bread. White pasta. White rice. Refined cornbread. Bagels and croissants. Crackers that contain trans fat. Vegetables Creamed or fried vegetables. Vegetables in a cheese sauce. Regular canned vegetables. Regular canned tomato sauce and paste. Regular tomato and vegetable juices. Fruits Dried fruits. Canned fruit in light or heavy syrup. Fruit juice. Meat and Other Protein Products Fatty cuts of meat. Ribs, chicken wings, bacon, sausage, bologna, salami, chitterlings, fatback, hot dogs, bratwurst, and packaged luncheon meats. Salted nuts and seeds. Canned beans with salt. Dairy Whole or 2% milk, cream, half-and-half, and cream cheese. Whole-fat or sweetened yogurt. Full-fat cheeses or blue cheese. Nondairy creamers and whipped toppings. Processed cheese, cheese spreads, or cheese curds. Condiments Onion and garlic salt, seasoned salt, table salt, and sea salt. Canned and packaged gravies. Worcestershire sauce. Tartar sauce. Barbecue sauce. Teriyaki sauce. Soy sauce, including reduced sodium. Steak sauce. Fish sauce. Oyster sauce. Cocktail sauce. Horseradish. Ketchup and mustard. Meat flavorings and tenderizers. Bouillon cubes. Hot sauce. Tabasco sauce. Marinades. Taco seasonings. Relishes. Fats and Oils Butter, stick margarine, lard, shortening, ghee, and bacon fat. Coconut, palm kernel, or palm oils. Regular salad dressings. Other Pickles and olives. Salted popcorn and pretzels. The items listed above may not be a complete list of foods and beverages to avoid. Contact your dietitian for more information. WHERE CAN I FIND MORE INFORMATION? National Heart, Lung, and Blood Institute:  travelstabloid.com   This information is not intended to replace advice given to you by your health care provider. Make sure you discuss any questions you have with your health care provider.   Document Released: 12/24/2010 Document Revised: 01/25/2014 Document Reviewed: 11/08/2012 Elsevier Interactive Patient Education 2016 Elsevier Inc.  Sleep Apnea  Sleep apnea is a sleep disorder characterized by abnormal pauses in breathing while you sleep. When your breathing pauses, the level of oxygen in your blood decreases. This causes you to move out of deep sleep and into light sleep. As a result, your quality of sleep is poor, and the system that carries your blood throughout your body (cardiovascular system) experiences stress. If sleep apnea remains untreated, the following conditions can develop:  High blood pressure (hypertension).  Coronary artery disease.  Inability to achieve or maintain an erection (impotence).  Impairment of your thought process (cognitive dysfunction). There are three types of sleep apnea: 1. Obstructive sleep apnea--Pauses in breathing during sleep because of a blocked airway. 2. Central sleep apnea--Pauses in breathing during sleep because the area of the brain that controls your breathing does not send the correct signals to the muscles that control breathing. 3. Mixed sleep apnea--A combination of both obstructive and central sleep apnea. RISK FACTORS The following risk factors can increase your risk of developing sleep apnea:  Being overweight.  Smoking.  Having narrow passages in your nose and throat.  Being of older age.  Being female.  Alcohol use.  Sedative and tranquilizer use.  Ethnicity. Among individuals younger than 35 years, African Americans are at increased risk of sleep apnea. SYMPTOMS   Difficulty staying asleep.  Daytime sleepiness and fatigue.  Loss of energy.  Irritability.  Loud, heavy  snoring.  Morning headaches.  Trouble concentrating.  Forgetfulness.  Decreased interest in sex.  Unexplained sleepiness. DIAGNOSIS  In order to diagnose sleep apnea, your caregiver will perform a physical examination. A sleep study done in the comfort of your own home may be appropriate if you are otherwise healthy. Your caregiver may also recommend that you spend the night in a sleep lab. In the sleep lab, several monitors record information about your heart, lungs, and brain while you sleep. Your leg and arm movements and blood oxygen level are also recorded. TREATMENT The following actions may help to resolve mild sleep apnea:  Sleeping on your side.   Using a decongestant if you have nasal congestion.   Avoiding the use of depressants, including alcohol, sedatives, and narcotics.   Losing weight and modifying your diet if you are overweight. There also are devices and treatments to help open your airway:  Oral appliances. These are custom-made mouthpieces that shift your lower jaw forward and slightly open your bite. This opens your airway.  Devices that create positive airway pressure. This positive pressure "splints" your airway open to help you breathe better during sleep. The following devices create positive airway pressure:  Continuous positive airway pressure (CPAP) device. The CPAP device creates a continuous level of air pressure with an air pump. The air is delivered to your airway through a mask while you sleep. This continuous pressure keeps your airway open.  Nasal expiratory positive airway pressure (EPAP) device. The EPAP device creates positive air pressure as you exhale. The device consists of single-use valves, which are inserted into each nostril and held in place by adhesive. The valves create very little resistance when you inhale but create much more resistance when you exhale. That increased resistance creates the positive airway pressure. This positive  pressure while you exhale keeps your airway open, making it easier to breath when you inhale again.  Bilevel positive airway pressure (BPAP) device. The BPAP device is used mainly in patients with central sleep apnea. This device is similar to the CPAP device because it also uses an air pump to deliver continuous air pressure through a mask. However, with the BPAP machine, the pressure is set at two different levels. The pressure when you exhale is lower than the pressure when you inhale.  Surgery. Typically, surgery is only done if you cannot comply with less invasive treatments or if the less invasive treatments do not improve your condition. Surgery involves removing excess tissue in your airway to create a wider passage way.   This information is not intended to replace advice given to you by your health care provider. Make sure you discuss any questions you have with your health care provider.   Document Released: 12/25/2001 Document Revised: 01/25/2014 Document Reviewed: 05/13/2011 Elsevier Interactive Patient Education [employer].
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
## 2015-06-18 NOTE — Assessment & Plan Note (Signed)
BMI of 38 indicating obesity which is contributing to her other comorbid conditions. Recommended weight loss approximate 5-10% of current body weight through nutrition and physical activity with examples of physical activity provided.Recommend increasing physical activity to 30 minutes of moderate level activity daily. Encourage nutritional intake that focuses on nutrient dense foods and is moderate, varied, and balanced and is low in saturated fats and processed/sugary foods. Continue to monitor.

## 2015-06-18 NOTE — Assessment & Plan Note (Signed)
Previously diagnosed with iron deficiency anemia. Obtain CBC, ferritin, and IBC panel. Follow-up pending blood work results.

## 2015-06-19 ENCOUNTER — Telehealth: Payer: Self-pay | Admitting: Family

## 2015-06-19 NOTE — Telephone Encounter (Signed)
Please inform patient that her kidney function, liver function, electrolytes, and hemoglobin A1c are all within the normal ranges. Her white/red blood cells show a very mild anemia and her iron saturation remains slightly low. Therefore would recommend continued iron supplement with ferrous sulfate 325 once daily. Follow-up for physical at her convenience.

## 2015-06-20 NOTE — Telephone Encounter (Signed)
LVM with lab results. Asked pt to call back with any questions.

## 2015-07-05 ENCOUNTER — Other Ambulatory Visit: Payer: Self-pay

## 2015-07-05 ENCOUNTER — Emergency Department (HOSPITAL_COMMUNITY)
Admission: EM | Admit: 2015-07-05 | Discharge: 2015-07-05 | Disposition: A | Discharge status: Home / Self Care | Payer: BC Managed Care – PPO | Attending: Emergency Medicine | Admitting: Emergency Medicine

## 2015-07-05 DIAGNOSIS — Z79899 Other long term (current) drug therapy: Secondary | ICD-10-CM | POA: Insufficient documentation

## 2015-07-05 DIAGNOSIS — I10 Essential (primary) hypertension: Secondary | ICD-10-CM | POA: Insufficient documentation

## 2015-07-05 DIAGNOSIS — M79602 Pain in left arm: Secondary | ICD-10-CM | POA: Diagnosis present

## 2015-07-05 DIAGNOSIS — M5412 Radiculopathy, cervical region: Secondary | ICD-10-CM | POA: Diagnosis not present

## 2015-07-05 LAB — CBC
HCT: 39.6 % (ref 36.0–46.0)
HEMOGLOBIN: 12.3 g/dL (ref 12.0–15.0)
MCH: 26.9 pg (ref 26.0–34.0)
MCHC: 31.1 g/dL (ref 30.0–36.0)
MCV: 86.5 fL (ref 78.0–100.0)
Platelets: 226 10*3/uL (ref 150–400)
RBC: 4.58 MIL/uL (ref 3.87–5.11)
RDW: 14.3 % (ref 11.5–15.5)
WBC: 7 10*3/uL (ref 4.0–10.5)

## 2015-07-05 LAB — BASIC METABOLIC PANEL
Anion gap: 9 (ref 5–15)
BUN: 14 mg/dL (ref 6–20)
CO2: 23 mmol/L (ref 22–32)
Calcium: 9 mg/dL (ref 8.9–10.3)
Chloride: 109 mmol/L (ref 101–111)
Creatinine, Ser: 0.85 mg/dL (ref 0.44–1.00)
GFR calc Af Amer: >60 mL/min (ref >60)
GLUCOSE: 100 mg/dL — AB (ref 65–99)
Potassium: 4 mmol/L (ref 3.5–5.1)
SODIUM: 141 mmol/L (ref 135–145)

## 2015-07-05 LAB — I-STAT TROPONIN, ED: Troponin i, poc: 0 ng/mL (ref 0.00–0.08)

## 2015-07-05 MED ORDER — TRAMADOL HCL 50 MG PO TABS: 100.0000 mg | ORAL_TABLET | Freq: Four times a day (QID) | ORAL | Status: DC | PRN

## 2015-07-05 MED ORDER — METHYLPREDNISOLONE 4 MG PO TBPK: ORAL_TABLET | ORAL | Status: DC

## 2015-07-05 NOTE — Discharge Instructions (Signed)
Cervical Radiculopathy Cervical radiculopathy happens when a nerve in the neck (cervical nerve) is pinched or bruised. This condition can develop because of an injury or as part of the normal aging process. Pressure on the cervical nerves can cause pain or numbness that runs from the neck all the way down into the arm and fingers. Usually, this condition gets better with rest. Treatment may be needed if the condition does not improve.  CAUSES This condition may be caused by:  Injury.  Slipped (herniated) disk.  Muscle tightness in the neck because of overuse.  Arthritis.  Breakdown or degeneration in the bones and joints of the spine (spondylosis) due to aging.  Bone spurs that may develop near the cervical nerves. SYMPTOMS Symptoms of this condition include:  Pain that runs from the neck to the arm and hand. The pain can be severe or irritating. It may be worse when the neck is moved.  Numbness or weakness in the affected arm and hand. DIAGNOSIS This condition may be diagnosed based on symptoms, medical history, and a physical exam. You may also have tests, including:  X-rays.  CT scan.  MRI.  Electromyogram (EMG).  Nerve conduction tests. TREATMENT In many cases, treatment is not needed for this condition. With rest, the condition usually gets better over time. If treatment is needed, options may include:  Wearing a soft neck collar for short periods of time.  Physical therapy to strengthen your neck muscles.  Medicines, such as NSAIDs, oral corticosteroids, or spinal injections.  Surgery. This may be needed if other treatments do not help. Various types of surgery may be done depending on the cause of your problems. HOME CARE INSTRUCTIONS Managing Pain  Take over-the-counter and prescription medicines only as told by your health care provider.  If directed, apply ice to the affected area.  Put ice in a plastic bag.  Place a towel between your skin and the  bag.  Leave the ice on for 20 minutes, 2-3 times per day.  If ice does not help, you can try using heat. Take a warm shower or warm bath, or use a heat pack as told by your health care provider.  Try a gentle neck and shoulder massage to help relieve symptoms. Activity  Rest as needed. Follow instructions from your health care provider about any restrictions on activities.  Do stretching and strengthening exercises as told by your health care provider or physical therapist. General Instructions  If you were given a soft collar, wear it as told by your health care provider.  Use a flat pillow when you sleep.  Keep all follow-up visits as told by your health care provider. This is important. SEEK MEDICAL CARE IF:  Your condition does not improve with treatment. SEEK IMMEDIATE MEDICAL CARE IF:  Your pain gets much worse and cannot be controlled with medicines.  You have weakness or numbness in your hand, arm, face, or leg.  You have a high fever.  You have a stiff, rigid neck.  You lose control of your bowels or your bladder (have incontinence).  You have trouble with walking, balance, or speaking.   This information is not intended to replace advice given to you by your health care provider. Make sure you discuss any questions you have with your health care provider.

## 2015-07-05 NOTE — ED Notes (Signed)
Pt is in stable condition upon d/c and ambulates from ED. 

## 2015-07-05 NOTE — ED Provider Notes (Signed)
CSN: [id number]     Arrival date & time 07/05/15  1156 History   First MD Initiated Contact with Patient 07/05/15 1617     Chief Complaint  Patient presents with  . Arm Pain  . Numbness     (Consider location/radiation/quality/duration/timing/severity/associated sxs/prior Treatment) HPI A she woke up with pain in her left arm this morning. She reports initially pain was shooting sharp pains down her arm from her shoulder. She reports that shooting sharp pains have subsided but her arm still hurts. She reports a entirety of her arm that is uncomfortable. She denies weakness. She denies difficulty picking up objects or incoordination with the arm. No numbness. No chest pain or shortness of breath. He reports after she got up this morning she did feel somewhat nauseated and vomited once. She reports that has not returned. She reports just after that she felt kind of tired for while. He does have a history of degenerative disease in her neck. She reports she had some problems with arm pain like this once before and she got some injections in her shoulder that helped for a while. Patient reports normally she has pain at home she takes tramadol. She reports she did not have any tramadol to try for pain today. Past Medical History  Diagnosis Date  . Hypertension   . Scoliosis   . Headache(784.0)   . H/O hiatal hernia   . Shortness of breath     with exertion  . History of kidney stones     1996ish  . GERD (gastroesophageal reflux disease)   . Anemia     Iron deficiency  . Chicken pox   . Depression   . Allergy   . Migraine    Past Surgical History  Procedure Laterality Date  . Tonsillectomy    . Colonoscopy N/A 01/09/2013    Procedure: COLONOSCOPY;  Surgeon: Beryle Beams, MD;  Location: [place];  Service: Endoscopy;  Laterality: N/A;  . Back surgery  07/21/1978  . Hernia repair Left 1981  . Tubal ligation  06/1988  . Cholecystectomy N/A 08/15/2013    Procedure: LAPAROSCOPIC  CHOLECYSTECTOMY WITH INTRAOPERATIVE CHOLANGIOGRAM;  Surgeon: Gwenyth Ober, MD;  Location: [facility];  Service: General;  Laterality: N/A;   Family History  Problem Relation Age of Onset  . Heart disease Father   . Hypertension Mother   . Arthritis Mother   . Diabetes Maternal Uncle   . Diabetes Maternal Grandmother   . Diabetes Paternal Grandmother    Social History  Substance Use Topics  . Smoking status: Never Smoker   . Smokeless tobacco: Never Used  . Alcohol Use: 0.6 oz/week    1 Cans of beer per week   OB History    No data available     Review of Systems 10 Systems reviewed and are negative for acute change except as noted in the HPI.    Allergies  Other and Sulfur  Home Medications   Prior to Admission medications   Medication Sig Start Date End Date Taking? Authorizing Provider  baclofen (LIORESAL) 10 MG tablet Take 10 mg by mouth 3 (three) times daily.    Historical Provider, MD  Bismuth Subsalicylate 678 MG TABS Take 2 tablets by mouth every 8 (eight) hours.    Historical Provider, MD  diclofenac (VOLTAREN) 75 MG EC tablet Take 75 mg by mouth 2 (two) times daily as needed.    Historical Provider, MD  esomeprazole (NEXIUM) 40 MG capsule Take 40 mg  by mouth daily.  04/23/13   Historical Provider, MD  gabapentin (NEURONTIN) 300 MG capsule Take 300 mg by mouth 3 (three) times daily.    Historical Provider, MD  ibuprofen (ADVIL,MOTRIN) 200 MG tablet Take 200-400 mg by mouth every 6 (six) hours as needed for fever, headache, mild pain, moderate pain or cramping.     Historical Provider, MD  labetalol (NORMODYNE) 100 MG tablet Take 1 tablet (100 mg total) by mouth 2 (two) times daily. 06/18/15   Golden Circle, FNP  lidocaine (LIDODERM) 5 % Place 1 patch onto the skin daily. Remove & Discard patch within 12 hours or as directed by MD 08/22/13   Janne Napoleon, NP  lisinopril (PRINIVIL,ZESTRIL) 20 MG tablet Take 1 tablet (20 mg total) by mouth daily. 06/18/15   Golden Circle, FNP   loperamide (IMODIUM) 2 MG capsule Take by mouth 2 (two) times daily.    Historical Provider, MD  methylPREDNISolone (MEDROL DOSEPAK) 4 MG TBPK tablet Take per dosepak instructions 07/05/15   Charlesetta Shanks, MD  tiZANidine (ZANAFLEX) 4 MG tablet Take 4 mg by mouth 2 (two) times daily as needed for muscle spasms.    Historical Provider, MD  traMADol (ULTRAM) 50 MG tablet Take 50 mg by mouth every 6 (six) hours as needed.    Historical Provider, MD  traMADol (ULTRAM) 50 MG tablet Take 2 tablets (100 mg total) by mouth every 6 (six) hours as needed for moderate pain. 07/05/15   Charlesetta Shanks, MD   BP 132/98 mmHg  Pulse 73  Temp(Src) 98 F (36.7 C) (Oral)  Resp 18  Wt 255 lb 5 oz (115.809 kg)  SpO2 95% Physical Exam  Constitutional: She is oriented to person, place, and time. She appears well-developed and well-nourished.  HENT:  Head: Normocephalic and atraumatic.  Eyes: EOM are normal. Pupils are equal, round, and reactive to light.  Neck: Neck supple. No thyromegaly present.  Patient has trapezius muscle spasm. She is tender to palpation throughout the trapezius but also endorses some focal trigger points of pain at approximately C5 C6 adjacent to the vertebral column.  Cardiovascular: Normal rate, regular rhythm, normal heart sounds and intact distal pulses.   Pulmonary/Chest: Effort normal and breath sounds normal. No stridor.  Abdominal: Soft. Bowel sounds are normal. She exhibits no distension. There is no tenderness.  Musculoskeletal: Normal range of motion. She exhibits no edema or tenderness.  No edema of her left upper extremity. Radial pulses normal. No soft tissue anomaly. No pain to palpation of soft tissues. Motor testing is 5 out of 5 with grip strength and push pull. Sensation is intact to light touch.  Lymphadenopathy:    She has no cervical adenopathy.  Neurological: She is alert and oriented to person, place, and time. She has normal strength. Coordination normal. GCS eye  subscore is 4. GCS verbal subscore is 5. GCS motor subscore is 6.  Skin: Skin is warm, dry and intact.  Psychiatric: She has a normal mood and affect.    ED Course  Procedures (including critical care time) Labs Review Labs Reviewed  BASIC METABOLIC PANEL - Abnormal; Notable for the following:    Glucose, Bld 100 (*)    All other components within normal limits  CBC  I-STAT TROPOININ, ED    Imaging Review No results found. I have personally reviewed and evaluated these images and lab results as part of my medical decision-making.   EKG Interpretation None      MDM  Final diagnoses:  Cervical radiculopathy   At this time, patient has left arm pain that is suggestive of radiculopathy. MRI 11/2014 last year showed C6 nerve root impingement and osteophyte complex. Also on examination patient has trapezius spasm on the left. Motor function is intact. At this time I feel she is safe to initiate symptomatic treatment with a Medrol Dosepak and tramadol which has been effective for pain in the past. He is counseled on signs and symptoms worsen to return.    Charlesetta Shanks, MD 07/05/15 [phone]

## 2015-07-05 NOTE — ED Notes (Addendum)
Pt here for left arm pain and numbness that started this am with fatigue and vomiting. sts sharp pain. Denies any radiation or SOB. sts it hurts with movement and she has to pick it up to move it. sts she wants to just sleep.

## 2015-07-09 ENCOUNTER — Ambulatory Visit (INDEPENDENT_AMBULATORY_CARE_PROVIDER_SITE_OTHER): Payer: BC Managed Care – PPO | Admitting: Family

## 2015-07-09 ENCOUNTER — Encounter: Payer: Self-pay | Admitting: Family

## 2015-07-09 VITALS — BP 112/78 | HR 69 | Temp 97.9°F | Resp 16 | Ht 68.0 in | Wt 259.0 lb

## 2015-07-09 DIAGNOSIS — K2100 Gastro-esophageal reflux disease with esophagitis, without bleeding: Secondary | ICD-10-CM

## 2015-07-09 DIAGNOSIS — R0989 Other specified symptoms and signs involving the circulatory and respiratory systems: Secondary | ICD-10-CM

## 2015-07-09 DIAGNOSIS — R198 Other specified symptoms and signs involving the digestive system and abdomen: Secondary | ICD-10-CM

## 2015-07-09 DIAGNOSIS — F458 Other somatoform disorders: Secondary | ICD-10-CM | POA: Diagnosis not present

## 2015-07-09 DIAGNOSIS — K219 Gastro-esophageal reflux disease without esophagitis: Secondary | ICD-10-CM | POA: Insufficient documentation

## 2015-07-09 DIAGNOSIS — I10 Essential (primary) hypertension: Secondary | ICD-10-CM

## 2015-07-09 MED ORDER — LABETALOL HCL 100 MG PO TABS: 100.0000 mg | ORAL_TABLET | Freq: Two times a day (BID) | ORAL | Status: DC

## 2015-07-09 MED ORDER — PANTOPRAZOLE SODIUM 40 MG PO TBEC: 40.0000 mg | DELAYED_RELEASE_TABLET | Freq: Every day | ORAL | Status: DC

## 2015-07-09 NOTE — Patient Instructions (Addendum)
Thank you for choosing [facility].  Summary/Instructions:  Please continue to take your medications as prescribed.  They will to schedule your gastroenterology appointment.   Your prescription(s) have been submitted to your pharmacy or been printed and provided for you. Please take as directed and contact our office if you believe you are having problem(s) with the medication(s) or have any questions.   If your symptoms worsen or fail to improve, please contact our office for further instruction, or in case of emergency go directly to the emergency room at the closest medical facility.   Food Choices for Gastroesophageal Reflux Disease, Adult When you have gastroesophageal reflux disease (GERD), the foods you eat and your eating habits are very important. Choosing the right foods can help ease the discomfort of GERD. WHAT GENERAL GUIDELINES DO I NEED TO FOLLOW?  Choose fruits, vegetables, whole grains, low-fat dairy products, and low-fat meat, fish, and poultry.  Limit fats such as oils, salad dressings, butter, nuts, and avocado.  Keep a food diary to identify foods that cause symptoms.  Avoid foods that cause reflux. These may be different for different people.  Eat frequent small meals instead of three large meals each day.  Eat your meals slowly, in a relaxed setting.  Limit fried foods.  Cook foods using methods other than frying.  Avoid drinking alcohol.  Avoid drinking large amounts of liquids with your meals.  Avoid bending over or lying down until 2-3 hours after eating. WHAT FOODS ARE NOT RECOMMENDED? The following are some foods and drinks that may worsen your symptoms: Vegetables Tomatoes. Tomato juice. Tomato and spaghetti sauce. Chili peppers. Onion and garlic. Horseradish. Fruits Oranges, grapefruit, and lemon (fruit and juice). Meats High-fat meats, fish, and poultry. This includes hot dogs, ribs, ham, sausage, salami, and bacon. Dairy Whole milk  and chocolate milk. Sour cream. Cream. Butter. Ice cream. Cream cheese.  Beverages Coffee and tea, with or without caffeine. Carbonated beverages or energy drinks. Condiments Hot sauce. Barbecue sauce.  Sweets/Desserts Chocolate and cocoa. Donuts. Peppermint and spearmint. Fats and Oils High-fat foods, including [place] fries and potato chips. Other Vinegar. Strong spices, such as black pepper, white pepper, red pepper, cayenne, curry powder, cloves, ginger, and chili powder. The items listed above may not be a complete list of foods and beverages to avoid. Contact your dietitian for more information.   This information is not intended to replace advice given to you by your health care provider. Make sure you discuss any questions you have with your health care provider.   Document Released: 01/04/2005 Document Revised: 01/25/2014 Document Reviewed: 11/08/2012 Elsevier Interactive Patient Education [employer].

## 2015-07-09 NOTE — Assessment & Plan Note (Signed)
Symptoms consistent with GERD and uncontrolled with over-the-counter regimen with sensations of globus pharyngeus. Discontinue over-the-counter Nexium. Start pantoprazole. Dietary and food information to reduce reflux provided. Referral to gastroenterology placed for possible endoscopy. Follow-up if symptoms worsen or do not improve.

## 2015-07-09 NOTE — Assessment & Plan Note (Signed)
Hypertension with improved control and below goal of 140/90 without adverse side effects. Blood pressures at home are stable. No symptoms of end organ damage. Continue current dosages of lisinopril and labetalol. Follow-up in 6 months or sooner.

## 2015-07-09 NOTE — Progress Notes (Signed)
Subjective:    Patient ID: Kristen [name], female    DOB: [birth year], 50 y.o.   MRN: [id number]  Chief Complaint  Patient presents with  . Follow-up    blood pressure, wants to talk about what to take for indegestion    HPI:  Kristen [name] is a 50 y.o. female who  has a past medical history of Hypertension; Scoliosis; Headache(784.0); H/O hiatal hernia; Shortness of breath; History of kidney stones; GERD (gastroesophageal reflux disease); Anemia; Chicken pox; Depression; Allergy; and Migraine. and presents today for a follow up office visit.   1.) Hypertension - Currently maintained on labetalol and lisinopril. Reports taking the medication as prescribed and denies adverse side effects or hypotensive readings. No coughs reported. Blood pressures at home around 110-120s. Denies symptoms of end organ damage.   BP Readings from Last 3 Encounters:  07/09/15 112/78  07/05/15 126/89  06/18/15 158/104   2.) Hiatal hernia / GERD - Currently maintained on OTC nexium and reports when she eats there is a fullness feeling in her throat and occasional sensations of globus pharyngeus. Modifying factors include water and sometimes it makes it worse. She does have a significant history of a hiatal hernia.   Allergies  Allergen Reactions  . Other Hives and Swelling    tomato  . Sulfur Hives and Swelling     Current Outpatient Prescriptions on File Prior to Visit  Medication Sig Dispense Refill  . baclofen (LIORESAL) 10 MG tablet Take 10 mg by mouth 3 (three) times daily.    . Bismuth Subsalicylate 791 MG TABS Take 2 tablets by mouth every 8 (eight) hours.    . diclofenac (VOLTAREN) 75 MG EC tablet Take 75 mg by mouth 2 (two) times daily as needed.    . gabapentin (NEURONTIN) 300 MG capsule Take 300 mg by mouth 3 (three) times daily.    Marland Kitchen ibuprofen (ADVIL,MOTRIN) 200 MG tablet Take 200-400 mg by mouth every 6 (six) hours as needed for fever, headache, mild pain, moderate pain or cramping.      . lidocaine (LIDODERM) 5 % Place 1 patch onto the skin daily. Remove & Discard patch within 12 hours or as directed by MD 6 patch 1  . lisinopril (PRINIVIL,ZESTRIL) 20 MG tablet Take 1 tablet (20 mg total) by mouth daily. 90 tablet 3  . loperamide (IMODIUM) 2 MG capsule Take by mouth 2 (two) times daily.    . methylPREDNISolone (MEDROL DOSEPAK) 4 MG TBPK tablet Take per dosepak instructions 21 tablet 0  . tiZANidine (ZANAFLEX) 4 MG tablet Take 4 mg by mouth 2 (two) times daily as needed for muscle spasms.    . traMADol (ULTRAM) 50 MG tablet Take 2 tablets (100 mg total) by mouth every 6 (six) hours as needed for moderate pain. 20 tablet 0   No current facility-administered medications on file prior to visit.     Past Surgical History  Procedure Laterality Date  . Tonsillectomy    . Colonoscopy N/A 01/09/2013    Procedure: COLONOSCOPY;  Surgeon: Beryle Beams, MD;  Location: [place];  Service: Endoscopy;  Laterality: N/A;  . Back surgery  07/21/1978  . Hernia repair Left 1981  . Tubal ligation  06/1988  . Cholecystectomy N/A 08/15/2013    Procedure: LAPAROSCOPIC CHOLECYSTECTOMY WITH INTRAOPERATIVE CHOLANGIOGRAM;  Surgeon: Gwenyth Ober, MD;  Location: [place];  Service: General;  Laterality: N/A;    Past Medical History  Diagnosis Date  . Hypertension   . Scoliosis   .  Headache(784.0)   . H/O hiatal hernia   . Shortness of breath     with exertion  . History of kidney stones     1996ish  . GERD (gastroesophageal reflux disease)   . Anemia     Iron deficiency  . Chicken pox   . Depression   . Allergy   . Migraine       Review of Systems  Constitutional: Negative for fever and chills.  Eyes:       Negative for changes in vision  Respiratory: Negative for cough, chest tightness and wheezing.   Cardiovascular: Negative for chest pain, palpitations and leg swelling.  Neurological: Negative for dizziness, weakness and light-headedness.      Objective:    BP 112/78  mmHg  Pulse 69  Temp(Src) 97.9 F (36.6 C) (Oral)  Resp 16  Ht 5' 8"  (1.727 m)  Wt 259 lb (117.482 kg)  BMI 39.39 kg/m2  SpO2 99% Nursing note and vital signs reviewed.  Physical Exam  Constitutional: She is oriented to person, place, and time. She appears well-developed and well-nourished. No distress.  HENT:  Mouth/Throat: Uvula is midline, oropharynx is clear and moist and mucous membranes are normal.  Cardiovascular: Normal rate, regular rhythm, normal heart sounds and intact distal pulses.   Pulmonary/Chest: Effort normal and breath sounds normal.  Neurological: She is alert and oriented to person, place, and time.  Skin: Skin is warm and dry.  Psychiatric: She has a normal mood and affect. Her behavior is normal. Judgment and thought content normal.       Assessment & Plan:   Problem List Items Addressed This Visit      Cardiovascular and Mediastinum   HTN (hypertension) - Primary (Chronic)    Hypertension with improved control and below goal of 140/90 without adverse side effects. Blood pressures at home are stable. No symptoms of end organ damage. Continue current dosages of lisinopril and labetalol. Follow-up in 6 months or sooner.      Relevant Medications   labetalol (NORMODYNE) 100 MG tablet     Digestive   GERD (gastroesophageal reflux disease)    Symptoms consistent with GERD and uncontrolled with over-the-counter regimen with sensations of globus pharyngeus. Discontinue over-the-counter Nexium. Start pantoprazole. Dietary and food information to reduce reflux provided. Referral to gastroenterology placed for possible endoscopy. Follow-up if symptoms worsen or do not improve.      Relevant Medications   pantoprazole (PROTONIX) 40 MG tablet   Other Relevant Orders   Ambulatory referral to Gastroenterology    Other Visit Diagnoses    Globus pharyngeus        Relevant Orders    Ambulatory referral to Gastroenterology        I have discontinued Ms.  Windish's esomeprazole. I am also having her start on pantoprazole. Additionally, I am having her maintain her ibuprofen, Bismuth Subsalicylate, diclofenac, tiZANidine, lidocaine, loperamide, baclofen, gabapentin, lisinopril, methylPREDNISolone, traMADol, and labetalol.   Meds ordered this encounter  Medications  . labetalol (NORMODYNE) 100 MG tablet    Sig: Take 1 tablet (100 mg total) by mouth 2 (two) times daily.    Dispense:  90 tablet    Refill:  1    Order Specific Question:  Supervising Provider    Answer:  Pricilla Holm A [[id number]]  . pantoprazole (PROTONIX) 40 MG tablet    Sig: Take 1 tablet (40 mg total) by mouth daily.    Dispense:  30 tablet    Refill:  3  Order Specific Question:  Supervising Provider    Answer:  Pricilla Holm A [[id number]]     Follow-up: Return in about 6 months (around 01/08/2016), or if symptoms worsen or fail to improve.  Mauricio Po, FNP

## 2015-07-09 NOTE — Progress Notes (Signed)
Pre visit review using our clinic review tool, if applicable. No additional management support is needed unless otherwise documented below in the visit note. 

## 2015-07-24 ENCOUNTER — Encounter: Payer: Self-pay | Admitting: Gastroenterology

## 2015-08-01 ENCOUNTER — Ambulatory Visit (INDEPENDENT_AMBULATORY_CARE_PROVIDER_SITE_OTHER): Payer: BC Managed Care – PPO | Admitting: Family

## 2015-08-01 ENCOUNTER — Encounter: Payer: Self-pay | Admitting: Family

## 2015-08-01 ENCOUNTER — Other Ambulatory Visit (INDEPENDENT_AMBULATORY_CARE_PROVIDER_SITE_OTHER): Payer: BC Managed Care – PPO

## 2015-08-01 DIAGNOSIS — R252 Cramp and spasm: Secondary | ICD-10-CM

## 2015-08-01 LAB — COMPREHENSIVE METABOLIC PANEL
ALT: 17 U/L (ref 0–35)
AST: 13 U/L (ref 0–37)
Albumin: 4.2 g/dL (ref 3.5–5.2)
Alkaline Phosphatase: 88 U/L (ref 39–117)
BUN: 20 mg/dL (ref 6–23)
CO2: 29 meq/L (ref 19–32)
Calcium: 9.7 mg/dL (ref 8.4–10.5)
Chloride: 105 mEq/L (ref 96–112)
Creatinine, Ser: 1.19 mg/dL (ref 0.40–1.20)
GFR: 61.71 mL/min (ref >60.00)
GLUCOSE: 120 mg/dL — AB (ref 70–99)
POTASSIUM: 3.9 meq/L (ref 3.5–5.1)
SODIUM: 143 meq/L (ref 135–145)
Total Bilirubin: 0.8 mg/dL (ref 0.2–1.2)
Total Protein: 7.4 g/dL (ref 6.0–8.3)

## 2015-08-01 LAB — MAGNESIUM: Magnesium: 1.9 mg/dL (ref 1.5–2.5)

## 2015-08-01 MED ORDER — TRAMADOL HCL 50 MG PO TABS: 100.0000 mg | ORAL_TABLET | Freq: Four times a day (QID) | ORAL | Status: DC | PRN

## 2015-08-01 NOTE — Patient Instructions (Signed)
Thank you for choosing [facility].  Summary/Instructions:  Please drink plenty of non-caffeinated/nonalcoholic beverages including water and sports drinks.  Stretch multiple times throughout the day.  Ice 20 minutes every 2 hours as needed and before bed.  If your symptoms worsen or fail to improve, please contact our office for further instruction, or in case of emergency go directly to the emergency room at the closest medical facility.   Muscle Cramps and Spasms Muscle cramps and spasms occur when a muscle or muscles tighten and you have no control over this tightening (involuntary muscle contraction). They are a common problem and can develop in any muscle. The most common place is in the calf muscles of the leg. Both muscle cramps and muscle spasms are involuntary muscle contractions, but they also have differences:   Muscle cramps are sporadic and painful. They may last a few seconds to a quarter of an hour. Muscle cramps are often more forceful and last longer than muscle spasms.  Muscle spasms may or may not be painful. They may also last just a few seconds or much longer. CAUSES  It is uncommon for cramps or spasms to be due to a serious underlying problem. In many cases, the cause of cramps or spasms is unknown. Some common causes are:   Overexertion.   Overuse from repetitive motions (doing the same thing over and over).   Remaining in a certain position for a long period of time.   Improper preparation, form, or technique while performing a sport or activity.   Dehydration.   Injury.   Side effects of some medicines.   Abnormally low levels of the salts and ions in your blood (electrolytes), especially potassium and calcium. This could happen if you are taking water pills (diuretics) or you are pregnant.  Some underlying medical problems can make it more likely to develop cramps or spasms. These include, but are not limited to:   Diabetes.    Parkinson disease.   Hormone disorders, such as thyroid problems.   Alcohol abuse.   Diseases specific to muscles, joints, and bones.   Blood vessel disease where not enough blood is getting to the muscles.  HOME CARE INSTRUCTIONS   Stay well hydrated. Drink enough water and fluids to keep your urine clear or pale yellow.  It may be helpful to massage, stretch, and relax the affected muscle.  For tight or tense muscles, use a warm towel, heating pad, or hot shower water directed to the affected area.  If you are sore or have pain after a cramp or spasm, applying ice to the affected area may relieve discomfort.  Put ice in a plastic bag.  Place a towel between your skin and the bag.  Leave the ice on for 15-20 minutes, 03-04 times a day.  Medicines used to treat a known cause of cramps or spasms may help reduce their frequency or severity. Only take over-the-counter or prescription medicines as directed by your caregiver. SEEK MEDICAL CARE IF:  Your cramps or spasms get more severe, more frequent, or do not improve over time.  MAKE SURE YOU:   Understand these instructions.  Will watch your condition.  Will get help right away if you are not doing well or get worse.   This information is not intended to replace advice given to you by your health care provider. Make sure you discuss any questions you have with your health care provider.   Document Released: 06/26/2001 Document Revised: 05/01/2012 Document Reviewed:  12/22/2011 Elsevier Interactive Patient Education [employer].

## 2015-08-01 NOTE — Progress Notes (Signed)
Subjective:    Patient ID: Kristen [name], female    DOB: 04-Nov-1965, 50 y.o.   MRN: [id number]  Chief Complaint  Patient presents with  . Leg cramps    states that she has had leg cramps for a while but it has gotten worse within the last 3 weeks, when laying down and trying to stretch they cramp up really bad    HPI:  Kristen [name] is a 50 y.o. female who  has a past medical history of Hypertension; Scoliosis; Headache(784.0); H/O hiatal hernia; Shortness of breath; History of kidney stones; GERD (gastroesophageal reflux disease); Anemia; Chicken pox; Depression; Allergy; and Migraine. and presents today For an office visit.  This is a new problem. Associated symptom of muscle cramping located in her bilateral calves have been going on for the approximately 3 weeks. Severity of the discomfort is about a 3 which is constant escalating to 10 when she stretches. Described as a "charlie horse like feel." Generally resolves on its own in a couple of minutes.  Timing of symptoms is generally at night and occasionally first thing in the morning. Modifying factors include walking which is not possible. Denies any trauma. Reports she is staying as hydrated as she can.    Allergies  Allergen Reactions  . Other Hives and Swelling    tomato  . Sulfur Hives and Swelling     Current Outpatient Prescriptions on File Prior to Visit  Medication Sig Dispense Refill  . baclofen (LIORESAL) 10 MG tablet Take 10 mg by mouth 3 (three) times daily.    . Bismuth Subsalicylate 767 MG TABS Take 2 tablets by mouth every 8 (eight) hours.    . diclofenac (VOLTAREN) 75 MG EC tablet Take 75 mg by mouth 2 (two) times daily as needed.    . gabapentin (NEURONTIN) 300 MG capsule Take 300 mg by mouth 3 (three) times daily.    Marland Kitchen ibuprofen (ADVIL,MOTRIN) 200 MG tablet Take 200-400 mg by mouth every 6 (six) hours as needed for fever, headache, mild pain, moderate pain or cramping.     . labetalol (NORMODYNE) 100  MG tablet Take 1 tablet (100 mg total) by mouth 2 (two) times daily. 90 tablet 1  . lidocaine (LIDODERM) 5 % Place 1 patch onto the skin daily. Remove & Discard patch within 12 hours or as directed by MD 6 patch 1  . lisinopril (PRINIVIL,ZESTRIL) 20 MG tablet Take 1 tablet (20 mg total) by mouth daily. 90 tablet 3  . loperamide (IMODIUM) 2 MG capsule Take by mouth 2 (two) times daily.    . methylPREDNISolone (MEDROL DOSEPAK) 4 MG TBPK tablet Take per dosepak instructions 21 tablet 0  . pantoprazole (PROTONIX) 40 MG tablet Take 1 tablet (40 mg total) by mouth daily. 30 tablet 3  . tiZANidine (ZANAFLEX) 4 MG tablet Take 4 mg by mouth 2 (two) times daily as needed for muscle spasms.     No current facility-administered medications on file prior to visit.    Past Medical History  Diagnosis Date  . Hypertension   . Scoliosis   . Headache(784.0)   . H/O hiatal hernia   . Shortness of breath     with exertion  . History of kidney stones     1996ish  . GERD (gastroesophageal reflux disease)   . Anemia     Iron deficiency  . Chicken pox   . Depression   . Allergy   . Migraine  Review of Systems  Constitutional: Negative for fever and chills.  Musculoskeletal:       Positive for muscle cramps      Objective:    BP 102/64 mmHg  Pulse 94  Temp(Src) 98.9 F (37.2 C) (Oral)  Resp 16  Ht 5' 9"  (1.753 m)  Wt 256 lb (116.121 kg)  BMI 37.79 kg/m2  SpO2 94% Nursing note and vital signs reviewed.  Physical Exam  Constitutional: She is oriented to person, place, and time. She appears well-developed and well-nourished. No distress.  Cardiovascular: Normal rate, regular rhythm, normal heart sounds and intact distal pulses.   Pulmonary/Chest: Effort normal and breath sounds normal.  Musculoskeletal:  Bilateral calves - no obvious deformity, discoloration, or edema. Mild tenderness and decreased range of motion noted of both calves secondary to tightness. Distal pulses and  sensation are intact and appropriate. Negative Homans sign and negative Thompson test.  Neurological: She is alert and oriented to person, place, and time.  Skin: Skin is warm and dry.  Psychiatric: She has a normal mood and affect. Her behavior is normal. Judgment and thought content normal.       Assessment & Plan:   Problem List Items Addressed This Visit      Other   Muscle cramp    Symptoms and exam consistent with muscle cramps most likely related to dehydration. Obtain complete metabolic profile and magnesium to rule out metabolic causes. Treat conservatively with ice therapy and stretching multiple times throughout the day. Encouraged hydration of nonalcoholic/non-caffeinated beverages. If symptoms do not improve consider additional vascular or neurological testing.      Relevant Orders   Comprehensive metabolic panel (Completed)   Magnesium (Completed)       I am having Ms. Robers maintain her ibuprofen, Bismuth Subsalicylate, diclofenac, tiZANidine, lidocaine, loperamide, baclofen, gabapentin, lisinopril, methylPREDNISolone, labetalol, pantoprazole, and traMADol.   Meds ordered this encounter  Medications  . traMADol (ULTRAM) 50 MG tablet    Sig: Take 2 tablets (100 mg total) by mouth every 6 (six) hours as needed for moderate pain.    Dispense:  60 tablet    Refill:  0    Order Specific Question:  Supervising Provider    Answer:  Pricilla Holm A [[id number]]     Follow-up: Return if symptoms worsen or fail to improve.  Mauricio Po, FNP

## 2015-08-01 NOTE — Assessment & Plan Note (Signed)
Symptoms and exam consistent with muscle cramps most likely related to dehydration. Obtain complete metabolic profile and magnesium to rule out metabolic causes. Treat conservatively with ice therapy and stretching multiple times throughout the day. Encouraged hydration of nonalcoholic/non-caffeinated beverages. If symptoms do not improve consider additional vascular or neurological testing.

## 2015-08-04 ENCOUNTER — Encounter: Payer: Self-pay | Admitting: Family

## 2015-09-06 ENCOUNTER — Encounter: Payer: Self-pay | Admitting: *Deleted

## 2015-09-10 ENCOUNTER — Ambulatory Visit (INDEPENDENT_AMBULATORY_CARE_PROVIDER_SITE_OTHER): Payer: BC Managed Care – PPO | Admitting: Pulmonary Disease

## 2015-09-10 ENCOUNTER — Encounter: Payer: Self-pay | Admitting: Pulmonary Disease

## 2015-09-10 VITALS — BP 118/80 | HR 85 | Ht 69.0 in | Wt 267.0 lb

## 2015-09-10 DIAGNOSIS — R0683 Snoring: Secondary | ICD-10-CM | POA: Diagnosis not present

## 2015-09-10 NOTE — Patient Instructions (Signed)
Will arrange for home sleep study Will call to arrange for follow up after sleep study reviewed  

## 2015-09-10 NOTE — Progress Notes (Signed)
Past Surgical History She  has a past surgical history that includes Tonsillectomy; Colonoscopy (N/A, 01/09/2013); Back surgery (07/21/1978); Hernia repair (Left, 1981); Tubal ligation (06/1988); and Cholecystectomy (N/A, 08/15/2013).  Allergies  Allergen Reactions  . Other Hives and Swelling    tomato  . Sulfur Hives and Swelling    Family History Her family history includes Arthritis in her mother; Diabetes in her maternal grandmother, maternal uncle, and paternal grandmother; Heart disease in her father; Hypertension in her mother.  Social History She  reports that she has never smoked. She has never used smokeless tobacco. She reports that she drinks about 0.6 oz of alcohol per week . She reports that she does not use drugs.  Review of systems Constitutional: Positive for unexpected weight change. Negative for fever.  HENT: Negative for congestion, dental problem, ear pain, nosebleeds, postnasal drip, rhinorrhea, sinus pressure, sneezing, sore throat and trouble swallowing.   Eyes: Negative for redness and itching.  Respiratory: Positive for shortness of breath ( d/t hernia). Negative for cough, chest tightness and wheezing.   Cardiovascular: Positive for leg swelling. Negative for palpitations.  Gastrointestinal: Negative for nausea and vomiting.  Genitourinary: Negative for dysuria.  Musculoskeletal: Negative for joint swelling.  Skin: Negative for rash.  Neurological: Negative for headaches.  Hematological: Does not bruise/bleed easily.  Psychiatric/Behavioral: Positive for dysphoric mood. The patient is not nervous/anxious.     Current Outpatient Prescriptions on File Prior to Visit  Medication Sig  . baclofen (LIORESAL) 10 MG tablet Take 10 mg by mouth 3 (three) times daily.  . Bismuth Subsalicylate 017 MG TABS Take 2 tablets by mouth every 8 (eight) hours.  . diclofenac (VOLTAREN) 75 MG EC tablet Take 75 mg by mouth 2 (two) times daily as needed.  . gabapentin (NEURONTIN)  300 MG capsule Take 300 mg by mouth 3 (three) times daily.  Marland Kitchen ibuprofen (ADVIL,MOTRIN) 200 MG tablet Take 200-400 mg by mouth every 6 (six) hours as needed for fever, headache, mild pain, moderate pain or cramping.   . labetalol (NORMODYNE) 100 MG tablet Take 1 tablet (100 mg total) by mouth 2 (two) times daily.  Marland Kitchen lidocaine (LIDODERM) 5 % Place 1 patch onto the skin daily. Remove & Discard patch within 12 hours or as directed by MD  . lisinopril (PRINIVIL,ZESTRIL) 20 MG tablet Take 1 tablet (20 mg total) by mouth daily.  Marland Kitchen loperamide (IMODIUM) 2 MG capsule Take by mouth 2 (two) times daily.  . pantoprazole (PROTONIX) 40 MG tablet Take 1 tablet (40 mg total) by mouth daily.  Marland Kitchen tiZANidine (ZANAFLEX) 4 MG tablet Take 4 mg by mouth 2 (two) times daily as needed for muscle spasms.  . traMADol (ULTRAM) 50 MG tablet Take 2 tablets (100 mg total) by mouth every 6 (six) hours as needed for moderate pain.   No current facility-administered medications on file prior to visit.     Chief Complaint  Patient presents with  . Sleep Consult    Referred by Dr Elna Breslow. Epworth Score: 20    Tests:  Past medical history She  has a past medical history of Allergy; Anemia; Chicken pox; Depression; GERD (gastroesophageal reflux disease); H/O hiatal hernia; Headache(784.0); History of kidney stones; Hypertension; Migraine; Scoliosis; and Shortness of breath.  Vital signs BP 118/80 (BP Location: Right Arm, Cuff Size: Normal)   Pulse 85   Ht 5' 9"  (1.753 m)   Wt 267 lb (121.1 kg)   SpO2 96%   BMI 39.43 kg/m   History of Present  Illness Kristen [name] is a 50 y.o. female for evaluation of sleep problems.  She has been concerned about feeling sleepy during the day.  This has been getting worse.  She will asleep whenever she is sitting still.  She snores, and wakes up feeling choked  She has been told she stops breathing when asleep.  She goes to sleep at 11 pm.  She falls asleep instantly.  She wakes up  at least one time to use the bathroom.  She gets out of bed at 5 am.  She feels exhausted in the morning.  She gets frequent headaches.  She does not use anything to help her fall sleep or stay awake.  She gets cramps in her legs at night.  She has trouble with back and neck pain, and these can cause trouble with her staying asleep.  She denies sleep walking, sleep talking, bruxism, or nightmares.  There is no history of restless legs.  She denies sleep hallucinations, sleep paralysis, or cataplexy.  She works as a Recruitment consultant.  The Epworth score is 20 out of 24.   Physical Exam:  General - No distress ENT - No sinus tenderness, no oral exudate, no LAN, no thyromegaly, TM clear, pupils equal/reactive, MP 4 Cardiac - s1s2 regular, no murmur, pulses symmetric Chest - No wheeze/rales/dullness, good air entry, normal respiratory excursion Back - No focal tenderness Abd - Soft, non-tender, no organomegaly, + bowel sounds Ext - No edema Neuro - Normal strength, cranial nerves intact Skin - No rashes Psych - Normal mood, and behavior  Discussion: She has snoring, sleep disruption, witnessed apnea, and daytime sleepiness.  She has hx of HTN.  I am concerned she could have sleep apnea.  We discussed how sleep apnea can affect various health problems, including risks for hypertension, cardiovascular disease, and diabetes.  We also discussed how sleep disruption can increase risks for accidents, such as while driving.  Weight loss as a means of improving sleep apnea was also reviewed.  Additional treatment options discussed were CPAP therapy, oral appliance, and surgical intervention.  Assessment/plan:  Snoring with concern for obstructive sleep apnea. - will arrange for home sleep study pending insurance approval   Patient Instructions  Will arrange for home sleep study Will call to arrange for follow up after sleep study reviewed    Chesley Mires, M.D. Pager [phone] 09/10/2015, 2:34  PM

## 2015-09-10 NOTE — Progress Notes (Signed)
   Subjective:    Patient ID: Kristen [name], female    DOB: [birth year], 50 y.o.   MRN: [id number]  HPI    Review of Systems  Constitutional: Positive for unexpected weight change. Negative for fever.  HENT: Negative for congestion, dental problem, ear pain, nosebleeds, postnasal drip, rhinorrhea, sinus pressure, sneezing, sore throat and trouble swallowing.   Eyes: Negative for redness and itching.  Respiratory: Positive for shortness of breath ( d/t hernia). Negative for cough, chest tightness and wheezing.   Cardiovascular: Positive for leg swelling. Negative for palpitations.  Gastrointestinal: Negative for nausea and vomiting.  Genitourinary: Negative for dysuria.  Musculoskeletal: Negative for joint swelling.  Skin: Negative for rash.  Neurological: Negative for headaches.  Hematological: Does not bruise/bleed easily.  Psychiatric/Behavioral: Positive for dysphoric mood. The patient is not nervous/anxious.        Objective:   Physical Exam        Assessment & Plan:

## 2015-09-19 ENCOUNTER — Other Ambulatory Visit: Payer: Self-pay | Admitting: Family

## 2015-09-19 DIAGNOSIS — Z1231 Encounter for screening mammogram for malignant neoplasm of breast: Secondary | ICD-10-CM

## 2015-09-25 ENCOUNTER — Ambulatory Visit: Payer: BC Managed Care – PPO | Admitting: Gastroenterology

## 2015-09-29 DIAGNOSIS — G4733 Obstructive sleep apnea (adult) (pediatric): Secondary | ICD-10-CM | POA: Diagnosis not present

## 2015-10-09 ENCOUNTER — Other Ambulatory Visit: Payer: Self-pay | Admitting: Specialist

## 2015-10-09 DIAGNOSIS — M25512 Pain in left shoulder: Secondary | ICD-10-CM

## 2015-10-09 DIAGNOSIS — R531 Weakness: Secondary | ICD-10-CM

## 2015-10-13 ENCOUNTER — Other Ambulatory Visit: Payer: Self-pay | Admitting: *Deleted

## 2015-10-13 ENCOUNTER — Encounter: Payer: Self-pay | Admitting: Pulmonary Disease

## 2015-10-13 ENCOUNTER — Telehealth: Payer: Self-pay | Admitting: Pulmonary Disease

## 2015-10-13 DIAGNOSIS — G4733 Obstructive sleep apnea (adult) (pediatric): Secondary | ICD-10-CM

## 2015-10-13 DIAGNOSIS — R0683 Snoring: Secondary | ICD-10-CM

## 2015-10-13 DIAGNOSIS — G4710 Hypersomnia, unspecified: Secondary | ICD-10-CM | POA: Insufficient documentation

## 2015-10-13 HISTORY — DX: Obstructive sleep apnea (adult) (pediatric): G47.33

## 2015-10-13 NOTE — Telephone Encounter (Signed)
HST 09/29/15 >> AHI 6.1, SaO2 low 82%.   Will have my nurse inform pt that sleep study shows mild sleep apnea.  Options are 1) CPAP now, 2) ROV first.  If She is agreeable to CPAP, then please send order for auto CPAP range 5 to 15 cm H2O with heated humidity and mask of choice.  Have download sent 1 month after starting CPAP and set up ROV 2 months after starting CPAP.  ROV can be with me or NP.

## 2015-10-14 ENCOUNTER — Ambulatory Visit
Admission: RE | Admit: 2015-10-14 | Discharge: 2015-10-14 | Disposition: A | Discharge status: Home / Self Care | Payer: BC Managed Care – PPO | Source: Ambulatory Visit | Attending: Specialist | Admitting: Specialist

## 2015-10-14 DIAGNOSIS — M25512 Pain in left shoulder: Secondary | ICD-10-CM

## 2015-10-14 DIAGNOSIS — R531 Weakness: Secondary | ICD-10-CM

## 2015-10-14 NOTE — Telephone Encounter (Signed)
Called spoke with pt. Reviewed results and recs. Scheduled ov with VS on 12/17/15. CPAP order placed. She voiced understanding and had no further questions.

## 2015-10-22 ENCOUNTER — Ambulatory Visit: Payer: BC Managed Care – PPO | Admitting: Physical Therapy

## 2015-10-23 ENCOUNTER — Encounter: Payer: Self-pay | Admitting: Physical Therapy

## 2015-10-23 ENCOUNTER — Ambulatory Visit: Payer: BC Managed Care – PPO | Attending: Specialist | Admitting: Physical Therapy

## 2015-10-23 DIAGNOSIS — M25512 Pain in left shoulder: Secondary | ICD-10-CM | POA: Diagnosis present

## 2015-10-23 DIAGNOSIS — M25612 Stiffness of left shoulder, not elsewhere classified: Secondary | ICD-10-CM | POA: Diagnosis present

## 2015-10-23 DIAGNOSIS — M6281 Muscle weakness (generalized): Secondary | ICD-10-CM

## 2015-10-23 NOTE — Therapy (Addendum)
[place], [place], [address] Phone: [phone]   Fax:  [phone]  Physical Therapy Evaluation  Patient Details  Name: Kristen [name] MRN: [id number] Date of Birth: 12-Nov-1965 Referring Provider: Basil Dess, MD  Encounter Date: 10/23/2015      PT End of Session - 10/28/15 1142    Visit Number 2   Number of Visits 16   Date for PT Re-Evaluation 12/23/15   PT Start Time 1105   PT Stop Time 1145   PT Time Calculation (min) 40 min   Activity Tolerance Patient tolerated treatment well   Behavior During Therapy [facility] for tasks assessed/performed      Past Medical History:  Diagnosis Date  . Allergy   . Anemia    Iron deficiency  . Chicken pox   . Depression   . GERD (gastroesophageal reflux disease)   . H/O hiatal hernia   . Headache(784.0)   . History of kidney stones    1996ish  . Hypertension   . Migraine   . OSA (obstructive sleep apnea) 10/13/2015  . Scoliosis   . Shortness of breath    with exertion    Past Surgical History:  Procedure Laterality Date  . BACK SURGERY  07/21/1978  . CHOLECYSTECTOMY N/A 08/15/2013   Procedure: LAPAROSCOPIC CHOLECYSTECTOMY WITH INTRAOPERATIVE CHOLANGIOGRAM;  Surgeon: Gwenyth Ober, MD;  Location: [facility];  Service: General;  Laterality: N/A;  . COLONOSCOPY N/A 01/09/2013   Procedure: COLONOSCOPY;  Surgeon: Beryle Beams, MD;  Location: [facility];  Service: Endoscopy;  Laterality: N/A;  . HERNIA REPAIR Left 1981  . TONSILLECTOMY    . TUBAL LIGATION  06/1988    There were no vitals filed for this visit.       Subjective Assessment - 10/28/15 1111    Subjective pt arriving to therapy c/o 1/10 pain in left shoulder. pt reporting feeling better in her left shoulder since begining her exercises. Pt reported achiness in left bicep and shoulder. Pt doing exercises once per day.     Limitations Lifting;House hold activities   Diagnostic tests MRI 09/29/15    Patient Stated Goals Be able to use my arm without it hurting   Currently in Pain? Yes   Pain Score 1    Pain Location Arm   Pain Orientation Left   Pain Descriptors / Indicators Aching   Pain Onset More than a month ago   Aggravating Factors  sleeping on left side   Pain Relieving Factors pain meds, resting   Effect of Pain on Daily Activities difficulty with reaching lifting and turning door knobs.    Multiple Pain Sites No            [facility] PT Assessment - 10/28/15 0001      AROM   Left Shoulder Flexion 145 Degrees   Left Shoulder ABduction 122 Degrees                   [facility] Adult PT Treatment/Exercise - 10/28/15 0001      Exercises   Exercises Neck;Shoulder     Neck Exercises: Theraband   Rows 10 reps;Red     Shoulder Exercises: Supine   Flexion AAROM;15 reps  with wand   ABduction AAROM;10 reps  with wand     Shoulder Exercises: Seated   Row Strengthening;10 reps;Theraband   Theraband Level (Shoulder Row) Level 2 (Red)   External Rotation 10 reps   Theraband Level (Shoulder External Rotation)  Level 2 (Red)   Internal Rotation 10 reps;Theraband   Theraband Level (Shoulder Internal Rotation) Level 2 (Red)   Other Seated Exercises bicep curls with red theraband x 10 reps     Shoulder Exercises: Pulleys   Flexion 2 minutes                PT Education - 10/28/15 1141    Education Details posture correction, reviewed HEP   Person(s) Educated Patient   Methods Explanation;Demonstration;Verbal cues   Comprehension Verbalized understanding;Returned demonstration          PT Short Term Goals - 10/28/15 1142      PT SHORT TERM GOAL #1   Title pt will be independent with her HEP   Baseline issued 10/23/15   Time 4   Period Weeks     PT SHORT TERM GOAL #2   Title Pt will report pain at night less than 7/10 in order to sleep more comfortably.    Baseline currently  10/10 at night   Time 4   Period Weeks   Status New            PT Long Term Goals - 10/23/15 1329      PT LONG TERM GOAL #1   Title pt will improve Left shoulder flexion to 170 degrees in order to improve functional mobility.    Time 8   Period Weeks   Status New     PT LONG TERM GOAL #2   Title pt will improve left shoulder abduction to 120 degrees in orer to improve functional mobility.    Time 8   Period Weeks   Status New     PT LONG TERM GOAL #3   Title pt will improve left shoulder strength to grossly 4/5 in order to improve functional mobility.    Time 8   Period Weeks   Status New     PT LONG TERM GOAL #4   Title Pt will improve her FOTO from 59% limitatioin to </=49% limitation in order to improve overall function   Time 8   Period Weeks   Status New               Plan - 10/28/15 1146    Clinical Impression Statement Pt presenting with pain in left shoulder which has improved some since her last visit. pt reported doing her exercises once daily. pt tolerated manual trigger point release in her left upper trap and soft tissue mobs today in left shoulder. Skilled PT needed to progress pt's toward her goals set and decreased pain.    Rehab Potential Good   PT Frequency 2x / week   PT Duration 8 weeks   PT Treatment/Interventions Therapeutic activities;Therapeutic exercise;ADLs/Self Care Home Management;Functional mobility training;Cryotherapy;Electrical Stimulation;Patient/family education;Passive range of motion;Manual techniques;Taping;Dry needling;Moist Heat;Traction;Iontophoresis 58m/ml Dexamethasone;Ultrasound;Gait training;Neuromuscular re-education;Balance training;Vasopneumatic Device   PT Next Visit Plan review HEP and progress as tolerated, soft tissue mobilizations  ? electrical stem to L shoulder   PT Home Exercise Plan cervial retraction, shoulder flexion, shoulder abd with wand   Consulted and Agree with Plan of Care Patient      Patient will benefit from skilled therapeutic intervention in order to improve  the following deficits and impairments:  Pain, Decreased activity tolerance, Impaired perceived functional ability, Decreased mobility, Decreased strength, Postural dysfunction, Impaired UE functional use  Visit Diagnosis: Acute pain of left shoulder - Plan: PT plan of care cert/re-cert  Stiffness of left shoulder, not elsewhere classified -  Plan: PT plan of care cert/re-cert  Muscle weakness (generalized) - Plan: PT plan of care cert/re-cert     Problem List Patient Active Problem List   Diagnosis Date Noted  . OSA (obstructive sleep apnea) 10/13/2015  . Muscle cramp 08/01/2015  . GERD (gastroesophageal reflux disease) 07/09/2015  . Excessive daytime sleepiness 06/18/2015  . Shingles 08/28/2013  . Postop check 08/28/2013  . Cholelithiasis 07/10/2013  . Colitis 01/08/2013  . Anemia 01/08/2013  . HTN (hypertension) 01/08/2013  . Morbid obesity ([place]) 01/08/2013    Oretha Caprice, MPT 10/28/2015, 2:45 PM  [facility] [address] [place], [place], [address] Phone: [phone]   Fax:  [phone]  Name: JAZSMIN COUSE MRN: [id number] Date of Birth: 27-May-1965

## 2015-10-28 ENCOUNTER — Ambulatory Visit: Payer: BC Managed Care – PPO | Admitting: Physical Therapy

## 2015-10-28 ENCOUNTER — Encounter: Payer: Self-pay | Admitting: Physical Therapy

## 2015-10-28 DIAGNOSIS — M25612 Stiffness of left shoulder, not elsewhere classified: Secondary | ICD-10-CM

## 2015-10-28 DIAGNOSIS — M6281 Muscle weakness (generalized): Secondary | ICD-10-CM

## 2015-10-28 DIAGNOSIS — M25512 Pain in left shoulder: Secondary | ICD-10-CM

## 2015-10-28 NOTE — Therapy (Signed)
[place], [place], [address] Phone: [phone]   Fax:  [phone]  Physical Therapy Treatment  Patient Details  Name: Kristen [name] MRN: [id number] Date of Birth: 1965-02-09 Referring Provider: Basil Dess, MD  Encounter Date: 10/28/2015      PT End of Session - 10/28/15 1142    Visit Number 2   Number of Visits 16   Date for PT Re-Evaluation 12/23/15   PT Start Time 1105   PT Stop Time 1145   PT Time Calculation (min) 40 min   Activity Tolerance Patient tolerated treatment well   Behavior During Therapy [facility] for tasks assessed/performed      Past Medical History:  Diagnosis Date  . Allergy   . Anemia    Iron deficiency  . Chicken pox   . Depression   . GERD (gastroesophageal reflux disease)   . H/O hiatal hernia   . Headache(784.0)   . History of kidney stones    1996ish  . Hypertension   . Migraine   . OSA (obstructive sleep apnea) 10/13/2015  . Scoliosis   . Shortness of breath    with exertion    Past Surgical History:  Procedure Laterality Date  . BACK SURGERY  07/21/1978  . CHOLECYSTECTOMY N/A 08/15/2013   Procedure: LAPAROSCOPIC CHOLECYSTECTOMY WITH INTRAOPERATIVE CHOLANGIOGRAM;  Surgeon: Gwenyth Ober, MD;  Location: [place];  Service: General;  Laterality: N/A;  . COLONOSCOPY N/A 01/09/2013   Procedure: COLONOSCOPY;  Surgeon: Beryle Beams, MD;  Location: [place];  Service: Endoscopy;  Laterality: N/A;  . HERNIA REPAIR Left 1981  . TONSILLECTOMY    . TUBAL LIGATION  06/1988    There were no vitals filed for this visit.      Subjective Assessment - 10/28/15 1111    Subjective pt arriving to therapy c/o 1/10 pain in left shoulder. pt reporting feeling better in her left shoulder since begining her exercises. Pt reported achiness in left bicep and shoulder. Pt doing exercises once per day.     Limitations Lifting;House hold activities   Diagnostic tests MRI 09/29/15    Patient Stated Goals Be able to use my arm without it hurting   Currently in Pain? Yes   Pain Score 1    Pain Location Arm   Pain Orientation Left   Pain Descriptors / Indicators Aching   Pain Onset More than a month ago   Aggravating Factors  sleeping on left side   Pain Relieving Factors pain meds, resting   Effect of Pain on Daily Activities difficulty with reaching lifting and turning door knobs.    Multiple Pain Sites No            [facility] PT Assessment - 10/28/15 0001      AROM   Left Shoulder Flexion 145 Degrees   Left Shoulder ABduction 122 Degrees                     [facility] Adult PT Treatment/Exercise - 10/28/15 0001      Exercises   Exercises Neck;Shoulder     Neck Exercises: Theraband   Rows 10 reps;Red     Shoulder Exercises: Supine   Flexion AAROM;15 reps  with wand   ABduction AAROM;10 reps  with wand     Shoulder Exercises: Seated   Row Strengthening;10 reps;Theraband   Theraband Level (Shoulder Row) Level 2 (Red)   External Rotation 10 reps   Theraband Level (Shoulder External  Rotation) Level 2 (Red)   Internal Rotation 10 reps;Theraband   Theraband Level (Shoulder Internal Rotation) Level 2 (Red)   Other Seated Exercises bicep curls with red theraband x 10 reps     Shoulder Exercises: Pulleys   Flexion 2 minutes                PT Education - 10/28/15 1141    Education Details posture correction, reviewed HEP   Person(s) Educated Patient   Methods Explanation;Demonstration;Verbal cues   Comprehension Verbalized understanding;Returned demonstration          PT Short Term Goals - 10/28/15 1142      PT SHORT TERM GOAL #1   Title pt will be independent with her HEP   Baseline issued 10/23/15   Time 4   Period Weeks     PT SHORT TERM GOAL #2   Title Pt will report pain at night less than 7/10 in order to sleep more comfortably.    Baseline currently  10/10 at night   Time 4   Period Weeks   Status New            PT Long Term Goals - 10/23/15 1329      PT LONG TERM GOAL #1   Title pt will improve Left shoulder flexion to 170 degrees in order to improve functional mobility.    Time 8   Period Weeks   Status New     PT LONG TERM GOAL #2   Title pt will improve left shoulder abduction to 120 degrees in orer to improve functional mobility.    Time 8   Period Weeks   Status New     PT LONG TERM GOAL #3   Title pt will improve left shoulder strength to grossly 4/5 in order to improve functional mobility.    Time 8   Period Weeks   Status New     PT LONG TERM GOAL #4   Title Pt will improve her FOTO from 59% limitatioin to </=49% limitation in order to improve overall function   Time 8   Period Weeks   Status New               Plan - 10/28/15 1146    Clinical Impression Statement Pt presenting with pain in left shoulder which has improved some since her last visit. pt reported doing her exercises once daily. pt tolerated manual trigger point release in her left upper trap and soft tissue mobs today in left shoulder. Skilled PT needed to progress pt's toward her goals set and decreased pain.    Rehab Potential Good   PT Frequency 2x / week   PT Duration 8 weeks   PT Treatment/Interventions Therapeutic activities;Therapeutic exercise;ADLs/Self Care Home Management;Functional mobility training;Cryotherapy;Electrical Stimulation;Patient/family education;Passive range of motion;Manual techniques;Taping;Dry needling;Moist Heat;Traction;Iontophoresis 80m/ml Dexamethasone;Ultrasound;Gait training;Neuromuscular re-education;Balance training;Vasopneumatic Device   PT Next Visit Plan review HEP and progress as tolerated, soft tissue mobilizations  ? electrical stem to L shoulder   PT Home Exercise Plan cervial retraction, shoulder flexion, shoulder abd with wand   Consulted and Agree with Plan of Care Patient      Patient will benefit from skilled therapeutic intervention in order  to improve the following deficits and impairments:  Pain, Decreased activity tolerance, Impaired perceived functional ability, Decreased mobility, Decreased strength, Postural dysfunction, Impaired UE functional use  Visit Diagnosis: Acute pain of left shoulder  Stiffness of left shoulder, not elsewhere classified  Muscle weakness (generalized)  Problem List Patient Active Problem List   Diagnosis Date Noted  . OSA (obstructive sleep apnea) 10/13/2015  . Muscle cramp 08/01/2015  . GERD (gastroesophageal reflux disease) 07/09/2015  . Excessive daytime sleepiness 06/18/2015  . Shingles 08/28/2013  . Postop check 08/28/2013  . Cholelithiasis 07/10/2013  . Colitis 01/08/2013  . Anemia 01/08/2013  . HTN (hypertension) 01/08/2013  . Morbid obesity ([place]) 01/08/2013    Oretha Caprice, MPT 10/28/2015, 11:52 AM  [facility] [address] [place], [place], [address] Phone: [phone]   Fax:  [phone]  Name: Kristen [name] MRN: [id number] Date of Birth: 1965/03/24

## 2015-10-30 ENCOUNTER — Ambulatory Visit: Payer: BC Managed Care – PPO

## 2015-10-30 DIAGNOSIS — M6281 Muscle weakness (generalized): Secondary | ICD-10-CM

## 2015-10-30 DIAGNOSIS — M25612 Stiffness of left shoulder, not elsewhere classified: Secondary | ICD-10-CM

## 2015-10-30 DIAGNOSIS — M25512 Pain in left shoulder: Secondary | ICD-10-CM

## 2015-10-30 NOTE — Patient Instructions (Signed)
From cabinet issued bilateral band exerc ER/row/extension x 10-20 reps 1-2x/day. Also Rockwood 10-20 reps green band, 1-2x/day. Over head and ER with abduction not issued

## 2015-10-30 NOTE — Therapy (Signed)
[place], [place], [address] Phone: [phone]   Fax:  [phone]  Physical Therapy Treatment  Patient Details  Name: Kristen [name] MRN: [id number] Date of Birth: Dec 21, 1965 Referring Provider: Basil Dess, MD  Encounter Date: 10/30/2015      PT End of Session - 10/30/15 1112    Visit Number 3   Number of Visits 16   Date for PT Re-Evaluation 12/23/15   PT Start Time 1110  late   PT Stop Time 1142   PT Time Calculation (min) 32 min   Activity Tolerance Patient tolerated treatment well   Behavior During Therapy [facility] for tasks assessed/performed      Past Medical History:  Diagnosis Date  . Allergy   . Anemia    Iron deficiency  . Chicken pox   . Depression   . GERD (gastroesophageal reflux disease)   . H/O hiatal hernia   . Headache(784.0)   . History of kidney stones    1996ish  . Hypertension   . Migraine   . OSA (obstructive sleep apnea) 10/13/2015  . Scoliosis   . Shortness of breath    with exertion    Past Surgical History:  Procedure Laterality Date  . BACK SURGERY  07/21/1978  . CHOLECYSTECTOMY N/A 08/15/2013   Procedure: LAPAROSCOPIC CHOLECYSTECTOMY WITH INTRAOPERATIVE CHOLANGIOGRAM;  Surgeon: Gwenyth Ober, MD;  Location: [place];  Service: General;  Laterality: N/A;  . COLONOSCOPY N/A 01/09/2013   Procedure: COLONOSCOPY;  Surgeon: Beryle Beams, MD;  Location: [place];  Service: Endoscopy;  Laterality: N/A;  . HERNIA REPAIR Left 1981  . TONSILLECTOMY    . TUBAL LIGATION  06/1988    There were no vitals filed for this visit.      Subjective Assessment - 10/30/15 1114    Subjective no different. Did well last time.  No pain in shoulder..   Feel alittle tight but no pain.Marland Kitchen doing HEP regularly   Currently in Pain? No/denies                         [facility] Adult PT Treatment/Exercise - 10/30/15 0001      Shoulder Exercises: Standing   External  Rotation Both;12 reps   Theraband Level (Shoulder External Rotation) Level 3 (Green)   Extension Both;12 reps   Theraband Level (Shoulder Extension) Level 3 (Green)   Row Both;12 reps   Theraband Level (Shoulder Row) Level 3 (Green)   Other Standing Exercises rockwood x 12 green band LT arm     Shoulder Exercises: Pulleys   Flexion 2 minutes     Shoulder Exercises: ROM/Strengthening   UBE (Upper Arm Bike) 5 min forward L1                PT Education - 10/30/15 1141    Education provided Yes   Education Details ROckwood and bilateral band exercises   Person(s) Educated Patient   Methods Explanation;Demonstration;Tactile cues;Verbal cues;Handout   Comprehension Returned demonstration;Verbalized understanding          PT Short Term Goals - 10/30/15 1144      PT SHORT TERM GOAL #1   Title pt will be independent with her HEP   Status On-going     PT SHORT TERM GOAL #2   Title Pt will report pain at night less than 7/10 in order to sleep more comfortably.    Status On-going  PT Long Term Goals - 10/23/15 1329      PT LONG TERM GOAL #1   Title pt will improve Left shoulder flexion to 170 degrees in order to improve functional mobility.    Time 8   Period Weeks   Status New     PT LONG TERM GOAL #2   Title pt will improve left shoulder abduction to 120 degrees in orer to improve functional mobility.    Time 8   Period Weeks   Status New     PT LONG TERM GOAL #3   Title pt will improve left shoulder strength to grossly 4/5 in order to improve functional mobility.    Time 8   Period Weeks   Status New     PT LONG TERM GOAL #4   Title Pt will improve her FOTO from 59% limitatioin to </=49% limitation in order to improve overall function   Time 8   Period Weeks   Status New               Plan - 10/30/15 1141    Clinical Impression Statement Pt reports no pain today nad was able to do all band exercise without pain. She was cautioned  to not have pain or over exert if shoulder feel fatigued.     PT Treatment/Interventions Therapeutic activities;Therapeutic exercise;ADLs/Self Care Home Management;Functional mobility training;Cryotherapy;Electrical Stimulation;Patient/family education;Passive range of motion;Manual techniques;Taping;Dry needling;Moist Heat;Traction;Iontophoresis 23m/ml Dexamethasone;Ultrasound;Gait training;Neuromuscular re-education;Balance training;Vasopneumatic Device   PT Next Visit Plan REview  band exercises , possible prone exercises for scapula, modalities as needed   PT Home Exercise Plan Band exercises   Consulted and Agree with Plan of Care Patient      Patient will benefit from skilled therapeutic intervention in order to improve the following deficits and impairments:  Pain, Decreased activity tolerance, Impaired perceived functional ability, Decreased mobility, Decreased strength, Postural dysfunction, Impaired UE functional use  Visit Diagnosis: Acute pain of left shoulder  Stiffness of left shoulder, not elsewhere classified  Muscle weakness (generalized)     Problem List Patient Active Problem List   Diagnosis Date Noted  . OSA (obstructive sleep apnea) 10/13/2015  . Muscle cramp 08/01/2015  . GERD (gastroesophageal reflux disease) 07/09/2015  . Excessive daytime sleepiness 06/18/2015  . Shingles 08/28/2013  . Postop check 08/28/2013  . Cholelithiasis 07/10/2013  . Colitis 01/08/2013  . Anemia 01/08/2013  . HTN (hypertension) 01/08/2013  . [place] 01/08/2013    CDarrel Hoover PT 10/30/2015, 11:46 AM  [facility] [address] [address]Phone: [phone]  Fax:  [phone] Name: Kristen HENTHORNMRN: [id number] of Birth: 531-Dec-1967

## 2015-11-03 ENCOUNTER — Ambulatory Visit: Payer: BC Managed Care – PPO | Admitting: Physical Therapy

## 2015-11-03 DIAGNOSIS — M25512 Pain in left shoulder: Secondary | ICD-10-CM | POA: Diagnosis not present

## 2015-11-03 DIAGNOSIS — M6281 Muscle weakness (generalized): Secondary | ICD-10-CM

## 2015-11-03 DIAGNOSIS — M25612 Stiffness of left shoulder, not elsewhere classified: Secondary | ICD-10-CM

## 2015-11-03 NOTE — Therapy (Signed)
[place], [place], [address] Phone: [phone]   Fax:  [phone]  Physical Therapy Treatment  Patient Details  Name: Kristen [name] MRN: [id number] Date of Birth: January 12, 1966 Referring Provider: Basil Dess, MD  Encounter Date: 11/03/2015      PT End of Session - 11/03/15 1235    Visit Number 4   Number of Visits 16   Date for PT Re-Evaluation 12/23/15   PT Start Time 2536   PT Stop Time 1310   PT Time Calculation (min) 39 min      Past Medical History:  Diagnosis Date  . Allergy   . Anemia    Iron deficiency  . Chicken pox   . Depression   . GERD (gastroesophageal reflux disease)   . H/O hiatal hernia   . Headache(784.0)   . History of kidney stones    1996ish  . Hypertension   . Migraine   . OSA (obstructive sleep apnea) 10/13/2015  . Scoliosis   . Shortness of breath    with exertion    Past Surgical History:  Procedure Laterality Date  . BACK SURGERY  07/21/1978  . CHOLECYSTECTOMY N/A 08/15/2013   Procedure: LAPAROSCOPIC CHOLECYSTECTOMY WITH INTRAOPERATIVE CHOLANGIOGRAM;  Surgeon: Gwenyth Ober, MD;  Location: [place];  Service: General;  Laterality: N/A;  . COLONOSCOPY N/A 01/09/2013   Procedure: COLONOSCOPY;  Surgeon: Beryle Beams, MD;  Location: [place];  Service: Endoscopy;  Laterality: N/A;  . HERNIA REPAIR Left 1981  . TONSILLECTOMY    . TUBAL LIGATION  06/1988    There were no vitals filed for this visit.      Subjective Assessment - 11/03/15 1258    Subjective My shoulder is much better.    Currently in Pain? Yes   Pain Score 3    Pain Location Shoulder   Pain Orientation Left   Pain Descriptors / Indicators Aching   Aggravating Factors  sleeping on left side   Pain Relieving Factors pain meds, rest   Multiple Pain Sites Yes   Pain Score 8   Pain Location Back   Pain Orientation Lower   Pain Descriptors / Indicators Sharp   Pain Type Acute pain    Aggravating Factors  just started yesterday   Pain Relieving Factors nothing yet            [facility] PT Assessment - 11/03/15 0001      AROM   Left Shoulder Flexion 155 Degrees   Left Shoulder ABduction 150 Degrees     Strength   Left Shoulder Flexion 4-/5   Left Shoulder ABduction 4-/5                     [facility] Adult PT Treatment/Exercise - 11/03/15 0001      Neck Exercises: Seated   Neck Retraction 10 reps     Shoulder Exercises: Standing   External Rotation 15 reps   Theraband Level (Shoulder External Rotation) Level 3 (Green)   Flexion 20 reps   Shoulder Flexion Weight (lbs) 1   ABduction 20 reps   Shoulder ABduction Weight (lbs) 1   Row 15 reps   Theraband Level (Shoulder Row) Level 3 (Green)   Other Standing Exercises rockwood x 12 green band LT arm     Shoulder Exercises: Pulleys   Flexion 2 minutes     Modalities   Modalities Moist Heat     Moist Heat Therapy   Number  Minutes Moist Heat 10 Minutes   Moist Heat Location Lumbar Spine  during shoulder exercises                  PT Short Term Goals - 11/03/15 1237      PT SHORT TERM GOAL #1   Title pt will be independent with her HEP   Time 4   Period Weeks   Status On-going     PT SHORT TERM GOAL #2   Title Pt will report pain at night less than 7/10 in order to sleep more comfortably.    Time 4   Period Weeks   Status Achieved           PT Long Term Goals - 11/03/15 1257      PT LONG TERM GOAL #1   Title pt will improve Left shoulder flexion to 170 degrees in order to improve functional mobility.    Time 8   Period Weeks   Status On-going     PT LONG TERM GOAL #2   Title pt will improve left shoulder abduction to 120 degrees in orer to improve functional mobility.    Time 8   Period Weeks   Status Achieved     PT LONG TERM GOAL #3   Title pt will improve left shoulder strength to grossly 4/5 in order to improve functional mobility.    Time 8   Period Weeks    Status On-going     PT LONG TERM GOAL #4   Title Pt will improve her FOTO from 59% limitatioin to </=49% limitation in order to improve overall function   Time 8   Period Weeks   Status Unable to assess               Plan - 11/03/15 1236    Clinical Impression Statement Pt reports shoulder pain has been much better. She is able to sleep on left shoulder with some discomfort however much less pain. STG# 2 Met. Her AROM and strength have also improved. LTG# 2 Met. Exercises positions varied today due to pt c/o increased back pain after sitting yesterday. It felt like something collapsed in her back. She has a rod in her spine since a young age. She sees MD this week and plans to ask to add Lumbar spine to PT POC. No shoulder pain at end of treatment.    PT Next Visit Plan continue  band exercises , possible prone exercises for scapula- if back pain allows, modalities as needed, strengthening, may add back to poc if she gets referral      Patient will benefit from skilled therapeutic intervention in order to improve the following deficits and impairments:  Pain, Decreased activity tolerance, Impaired perceived functional ability, Decreased mobility, Decreased strength, Postural dysfunction, Impaired UE functional use  Visit Diagnosis: Acute pain of left shoulder  Stiffness of left shoulder, not elsewhere classified  Muscle weakness (generalized)     Problem List Patient Active Problem List   Diagnosis Date Noted  . OSA (obstructive sleep apnea) 10/13/2015  . Muscle cramp 08/01/2015  . GERD (gastroesophageal reflux disease) 07/09/2015  . Excessive daytime sleepiness 06/18/2015  . Shingles 08/28/2013  . Postop check 08/28/2013  . Cholelithiasis 07/10/2013  . Colitis 01/08/2013  . Anemia 01/08/2013  . HTN (hypertension) 01/08/2013  . Morbid obesity ([place]) 01/08/2013    Dorene Ar , PTA 11/03/2015, 1:23 PM  [place],  [place], [address] Phone: [phone]   Fax:  [phone]  Name: Kristen [name] MRN: [id number] Date of Birth: 1965/03/08

## 2015-11-05 ENCOUNTER — Ambulatory Visit: Payer: BC Managed Care – PPO

## 2015-11-06 ENCOUNTER — Ambulatory Visit (INDEPENDENT_AMBULATORY_CARE_PROVIDER_SITE_OTHER): Payer: BC Managed Care – PPO | Admitting: Specialist

## 2015-11-06 DIAGNOSIS — M5416 Radiculopathy, lumbar region: Secondary | ICD-10-CM

## 2015-11-07 ENCOUNTER — Other Ambulatory Visit (INDEPENDENT_AMBULATORY_CARE_PROVIDER_SITE_OTHER): Payer: Self-pay | Admitting: Surgery

## 2015-11-07 DIAGNOSIS — M5416 Radiculopathy, lumbar region: Secondary | ICD-10-CM

## 2015-11-11 ENCOUNTER — Ambulatory Visit: Payer: BC Managed Care – PPO | Admitting: Physical Therapy

## 2015-11-13 ENCOUNTER — Ambulatory Visit: Payer: BC Managed Care – PPO

## 2015-11-13 ENCOUNTER — Encounter: Payer: BC Managed Care – PPO | Admitting: Physical Therapy

## 2015-11-13 DIAGNOSIS — M25612 Stiffness of left shoulder, not elsewhere classified: Secondary | ICD-10-CM

## 2015-11-13 DIAGNOSIS — M25512 Pain in left shoulder: Secondary | ICD-10-CM

## 2015-11-13 DIAGNOSIS — M6281 Muscle weakness (generalized): Secondary | ICD-10-CM

## 2015-11-13 NOTE — Therapy (Signed)
[place], [place], [address] Phone: [phone]   Fax:  [phone]  Physical Therapy Treatment/Discharge  Patient Details  Name: Kristen [name] MRN: [id number] Date of Birth: 07-17-1965 Referring Provider: Basil Dess, MD  Encounter Date: 11/13/2015      PT End of Session - 11/13/15 1234    Visit Number 5   Number of Visits 16   Date for PT Re-Evaluation 12/23/15   PT Start Time 1234   PT Stop Time 1315   PT Time Calculation (min) 41 min   Activity Tolerance Patient tolerated treatment well   Behavior During Therapy [facility] for tasks assessed/performed      Past Medical History:  Diagnosis Date  . Allergy   . Anemia    Iron deficiency  . Chicken pox   . Depression   . GERD (gastroesophageal reflux disease)   . H/O hiatal hernia   . Headache(784.0)   . History of kidney stones    1996ish  . Hypertension   . Migraine   . OSA (obstructive sleep apnea) 10/13/2015  . Scoliosis   . Shortness of breath    with exertion    Past Surgical History:  Procedure Laterality Date  . BACK SURGERY  07/21/1978  . CHOLECYSTECTOMY N/A 08/15/2013   Procedure: LAPAROSCOPIC CHOLECYSTECTOMY WITH INTRAOPERATIVE CHOLANGIOGRAM;  Surgeon: Gwenyth Ober, MD;  Location: [place];  Service: General;  Laterality: N/A;  . COLONOSCOPY N/A 01/09/2013   Procedure: COLONOSCOPY;  Surgeon: Beryle Beams, MD;  Location: [place];  Service: Endoscopy;  Laterality: N/A;  . HERNIA REPAIR Left 1981  . TONSILLECTOMY    . TUBAL LIGATION  06/1988    There were no vitals filed for this visit.      Subjective Assessment - 11/13/15 1237    Subjective no pain today in shoulder.    Currently in Pain? No/denies            [facility] PT Assessment - 11/13/15 0001      AROM   Left Shoulder Flexion 165 Degrees   Left Shoulder ABduction 160 Degrees   Cervical - Right Rotation 64   Cervical - Left Rotation 63     Strength    Left Shoulder Flexion 4+/5   Left Shoulder Extension 4+/5   Left Shoulder ABduction 4+/5   Left Shoulder Internal Rotation 4+/5   Left Shoulder External Rotation 4+/5                     [facility] Adult PT Treatment/Exercise - 11/13/15 0001      Shoulder Exercises: Seated   Other Seated Exercises overhead press 5 pounds 2 x 10     Shoulder Exercises: Standing   Other Standing Exercises rockwood x 20 reps green band LT arm     Shoulder Exercises: Pulleys   Flexion 3 minutes     Shoulder Exercises: ROM/Strengthening   UBE (Upper Arm Bike) 5 min forward L1     Shoulder Exercises: Body Blade   Other Body Blade Exercises attempted rotation and abduction but she was not able to do correctly even with cues so modified to do short range rotation in standing  15 reps.                   PT Short Term Goals - 11/13/15 1308      PT SHORT TERM GOAL #1   Title pt will be independent with her HEP  Status Achieved     PT SHORT TERM GOAL #2   Title Pt will report pain at night less than 7/10 in order to sleep more comfortably.    Status Achieved           PT Long Term Goals - 11/13/15 1309      PT LONG TERM GOAL #1   Title pt will improve Left shoulder flexion to 170 degrees in order to improve functional mobility.    Baseline 165 degrees   Status Partially Met     PT LONG TERM GOAL #2   Title pt will improve left shoulder abduction to 120 degrees in orer to improve functional mobility.    Baseline 165   Status Achieved     PT LONG TERM GOAL #3   Title pt will improve left shoulder strength to grossly 4/5 in order to improve functional mobility.    Status Achieved     PT LONG TERM GOAL #4   Title Pt will improve her FOTO from 59% limitatioin to </=49% limitation in order to improve overall function   Baseline she reports she was 75% or more better . Could not find her until after she left and she was under Supriya williams   Status Unable to assess                Plan - 11/13/15 1318    Clinical Impression Statement Ms Dumais  reports no pain with use of LT arm except with weight bearing sleeping on LT side. Her ROM is functional nad her strength is good .  She agreed she is ready for discharg and reports she is doing her HEP regularly and will buy some weigth for home.    PT Treatment/Interventions Therapeutic activities;Therapeutic exercise;ADLs/Self Care Home Management;Functional mobility training;Cryotherapy;Electrical Stimulation;Patient/family education;Passive range of motion;Manual techniques;Taping;Dry needling;Moist Heat;Traction;Iontophoresis 30m/ml Dexamethasone;Ultrasound;Gait training;Neuromuscular re-education;Balance training;Vasopneumatic Device   PT Next Visit Plan discharge with hEP   PT Home Exercise Plan Band exercises   Consulted and Agree with Plan of Care Patient      Patient will benefit from skilled therapeutic intervention in order to improve the following deficits and impairments:  Pain, Decreased activity tolerance, Impaired perceived functional ability, Decreased mobility, Decreased strength, Postural dysfunction, Impaired UE functional use  Visit Diagnosis: Acute pain of left shoulder  Stiffness of left shoulder, not elsewhere classified  Muscle weakness (generalized)     Problem List Patient Active Problem List   Diagnosis Date Noted  . OSA (obstructive sleep apnea) 10/13/2015  . Muscle cramp 08/01/2015  . GERD (gastroesophageal reflux disease) 07/09/2015  . Excessive daytime sleepiness 06/18/2015  . Shingles 08/28/2013  . Postop check 08/28/2013  . Cholelithiasis 07/10/2013  . Colitis 01/08/2013  . Anemia 01/08/2013  . HTN (hypertension) 01/08/2013  . Morbid obesity ([place] 01/08/2013    CDarrel Hoover PT 11/13/2015, 1:24 PM  [facility] [place] [address]Phone: [phone]  Fax:  [phone] Name:  Kristen SYLVAMRN: [id number] of Birth: 509-05-1965 PHYSICAL THERAPY DISCHARGE SUMMARY  Visits from Start of Care: 5  Current functional level related to goals / functional outcomes: See above   Remaining deficits: See above   Education / Equipment: HEP  Plan: Patient agrees to discharge.  Patient goals were partially met. Patient is being discharged due to being pleased with the current functional level.  ?????

## 2015-11-18 ENCOUNTER — Ambulatory Visit: Payer: BC Managed Care – PPO | Admitting: Pulmonary Disease

## 2015-11-19 ENCOUNTER — Ambulatory Visit
Admission: RE | Admit: 2015-11-19 | Discharge: 2015-11-19 | Disposition: A | Discharge status: Home / Self Care | Payer: BC Managed Care – PPO | Source: Ambulatory Visit | Attending: Surgery | Admitting: Surgery

## 2015-11-19 DIAGNOSIS — M5416 Radiculopathy, lumbar region: Secondary | ICD-10-CM

## 2015-11-21 ENCOUNTER — Ambulatory Visit
Admission: RE | Admit: 2015-11-21 | Discharge: 2015-11-21 | Disposition: A | Discharge status: Home / Self Care | Payer: BC Managed Care – PPO | Source: Ambulatory Visit | Attending: Family | Admitting: Family

## 2015-11-21 DIAGNOSIS — Z1231 Encounter for screening mammogram for malignant neoplasm of breast: Secondary | ICD-10-CM

## 2015-12-03 ENCOUNTER — Encounter (INDEPENDENT_AMBULATORY_CARE_PROVIDER_SITE_OTHER): Payer: Self-pay | Admitting: Specialist

## 2015-12-03 ENCOUNTER — Ambulatory Visit (INDEPENDENT_AMBULATORY_CARE_PROVIDER_SITE_OTHER): Payer: BC Managed Care – PPO | Admitting: Specialist

## 2015-12-03 VITALS — BP 157/99 | HR 65

## 2015-12-03 DIAGNOSIS — M5136 Other intervertebral disc degeneration, lumbar region: Secondary | ICD-10-CM

## 2015-12-03 DIAGNOSIS — M48062 Spinal stenosis, lumbar region with neurogenic claudication: Secondary | ICD-10-CM | POA: Diagnosis not present

## 2015-12-03 DIAGNOSIS — M4126 Other idiopathic scoliosis, lumbar region: Secondary | ICD-10-CM | POA: Diagnosis not present

## 2015-12-03 MED ORDER — TRAMADOL HCL 50 MG PO TABS: 100.0000 mg | ORAL_TABLET | Freq: Four times a day (QID) | ORAL | 0 refills | Status: DC | PRN

## 2015-12-03 NOTE — Patient Instructions (Signed)

## 2015-12-03 NOTE — Progress Notes (Signed)
Office Visit Note   Patient: Kristen [name]           Date of Birth: 1965/11/20           MRN: [id number] Visit Date: 12/03/2015              Requested by: [address], [place], [place] [address] PCP: Mauricio Po, FNP   Assessment & Plan: Visit Diagnoses:  1. Spinal stenosis of lumbar region with neurogenic claudication   2. Degenerative disc disease, lumbar     Plan: Avoid bending, stooping and avoid lifting weights greater than 10 lbs. Avoid prolong standing and walking. Order for a new walker with wheels. Surgery scheduling secretary Kandice Hams, will call you in the next week to schedule for surgery.  Surgery recommended is a two level lumbar fusion L3-4 and L4-5 this would be done with rods, screws and cages with local bone graft and allograft (donor bone graft). Take hydrocodone for for pain. Risk of surgery includes risk of infection 1 in 200 patients, bleeding 1/2% chance you would need a transfusion.   Risk to the nerves is one in 10,000. You will need to use a brace for 3 months and wean from the brace on the 4th month. Expect improved walking and standing tolerance. Expect relief of leg pain but numbness may persist depending on the length and degree of pressure that has been present.     Follow-Up Instructions: Return in about 4 weeks (around 12/31/2015).   Orders:  No orders of the defined types were placed in this encounter.  No orders of the defined types were placed in this encounter.     Procedures: No procedures performed   Clinical Data: No additional findings.   Subjective: Chief Complaint  Patient presents with  . Lower Back - Pain    Patient comes in today to review MRI Report for her back. Still having pain. Pain level today is a 5/10. Having some numbness down right leg into foot. She has some difficult sleeping. Patient would like a refill on Tramadol.    Review of Systems  HENT: Positive for  rhinorrhea, sinus pain and sore throat.   Eyes: Negative.   Respiratory: Negative for apnea, cough, chest tightness, shortness of breath, wheezing and stridor.   Cardiovascular: Negative.   Gastrointestinal: Positive for diarrhea. Negative for abdominal distention, abdominal pain, anal bleeding, blood in stool, constipation, nausea, rectal pain and vomiting.  Endocrine: Negative.   Genitourinary: Negative.   Musculoskeletal: Positive for back pain and gait problem.  Skin: Negative.   Allergic/Immunologic: Negative for environmental allergies, food allergies and immunocompromised state.  Neurological: Positive for weakness and numbness. Negative for dizziness, tremors, seizures, syncope, facial asymmetry, speech difficulty, light-headedness and headaches.  Hematological: Negative.   Psychiatric/Behavioral: Negative.      Objective: Vital Signs: BP (!) 157/99   Pulse 65   Physical Exam  Back Exam   Tenderness  The patient is experiencing tenderness in the lumbar.  Range of Motion  Extension:  0 abnormal  Flexion: normal  Lateral Bend Right: abnormal  Lateral Bend Left: abnormal   Muscle Strength  Right Quadriceps:  5/5  Left Quadriceps:  5/5  Right Hamstrings:  5/5  Left Hamstrings:  5/5   Tests  Straight leg raise right: negative Straight leg raise left: negative  Reflexes  Patellar: 1/4 Achilles: 1/4 Babinski's sign: normal   Other  Toe Walk: normal Heel Walk: normal Sensation: normal Gait: normal  Erythema: no back redness Scars: present      Specialty Comments:  No specialty comments available.  Imaging: No results found.   PMFS History: Patient Active Problem List   Diagnosis Date Noted  . OSA (obstructive sleep apnea) 10/13/2015  . Muscle cramp 08/01/2015  . GERD (gastroesophageal reflux disease) 07/09/2015  . Excessive daytime sleepiness 06/18/2015  . Shingles 08/28/2013  . Postop check 08/28/2013  . Cholelithiasis 07/10/2013  .  Colitis 01/08/2013  . Anemia 01/08/2013  . HTN (hypertension) 01/08/2013  . Morbid obesity ([place]) 01/08/2013   Past Medical History:  Diagnosis Date  . Allergy   . Anemia    Iron deficiency  . Chicken pox   . Depression   . GERD (gastroesophageal reflux disease)   . H/O hiatal hernia   . Headache(784.0)   . History of kidney stones    1996ish  . Hypertension   . Migraine   . OSA (obstructive sleep apnea) 10/13/2015  . Scoliosis   . Shortness of breath    with exertion    Family History  Problem Relation Age of Onset  . Heart disease Father   . Hypertension Mother   . Arthritis Mother   . Diabetes Maternal Grandmother   . Diabetes Paternal Grandmother   . Diabetes Maternal Uncle     Past Surgical History:  Procedure Laterality Date  . BACK SURGERY  07/21/1978  . CHOLECYSTECTOMY N/A 08/15/2013   Procedure: LAPAROSCOPIC CHOLECYSTECTOMY WITH INTRAOPERATIVE CHOLANGIOGRAM;  Surgeon: Gwenyth Ober, MD;  Location: [place];  Service: General;  Laterality: N/A;  . COLONOSCOPY N/A 01/09/2013   Procedure: COLONOSCOPY;  Surgeon: Beryle Beams, MD;  Location: [place];  Service: Endoscopy;  Laterality: N/A;  . HERNIA REPAIR Left 1981  . TONSILLECTOMY    . TUBAL LIGATION  06/1988   Social History   Occupational History  . Safety Assistant    Social History Main Topics  . Smoking status: Never Smoker  . Smokeless tobacco: Never Used  . Alcohol use 0.6 oz/week    1 Cans of beer per week     Comment: 1 beer daily  . Drug use: No  . Sexual activity: Yes    Birth control/ protection: Surgical

## 2015-12-04 ENCOUNTER — Ambulatory Visit: Payer: BC Managed Care – PPO | Admitting: Gastroenterology

## 2015-12-17 ENCOUNTER — Telehealth (INDEPENDENT_AMBULATORY_CARE_PROVIDER_SITE_OTHER): Payer: Self-pay

## 2015-12-17 ENCOUNTER — Ambulatory Visit: Payer: BC Managed Care – PPO | Admitting: Pulmonary Disease

## 2015-12-17 NOTE — Telephone Encounter (Signed)
  Dear Mauricio Po, FNP:  This is an official request for a PRE-OPERATIVE CONSULTATION for our patient, Kristen [name] [birth year].  He/She will be undergoing a major orthopedic procedure in the near future.  We would appreciate a letter of medical/cardiac clearance.  The surgery will not be performed until this is received in our office.  Once your evaluation is completed, please fax your letter to our office at [phone] or reply to this message.  Thank you for your help and for allowing [place] to care for your patient.                                                                                                          Sincerely,                                                                                                            Basil Dess, MD  Planned Procedure:    Lumbar Fusion  Please fax to [phone] or reply to [employer], [employer], [place] [phone] direct number).

## 2015-12-18 NOTE — Telephone Encounter (Signed)
LVM letting pt know to call and set appointment up.

## 2015-12-18 NOTE — Telephone Encounter (Signed)
Please have patient schedule appointment for surgical clearance.

## 2015-12-22 ENCOUNTER — Ambulatory Visit (INDEPENDENT_AMBULATORY_CARE_PROVIDER_SITE_OTHER): Payer: BC Managed Care – PPO | Admitting: Family

## 2015-12-22 ENCOUNTER — Encounter: Payer: Self-pay | Admitting: Family

## 2015-12-22 ENCOUNTER — Other Ambulatory Visit (INDEPENDENT_AMBULATORY_CARE_PROVIDER_SITE_OTHER): Payer: BC Managed Care – PPO

## 2015-12-22 VITALS — BP 150/110 | HR 70 | Temp 98.2°F | Resp 16 | Ht 69.0 in | Wt 265.0 lb

## 2015-12-22 DIAGNOSIS — M5441 Lumbago with sciatica, right side: Secondary | ICD-10-CM | POA: Diagnosis not present

## 2015-12-22 DIAGNOSIS — Z01818 Encounter for other preprocedural examination: Secondary | ICD-10-CM

## 2015-12-22 DIAGNOSIS — I10 Essential (primary) hypertension: Secondary | ICD-10-CM

## 2015-12-22 DIAGNOSIS — M545 Low back pain, unspecified: Secondary | ICD-10-CM | POA: Insufficient documentation

## 2015-12-22 DIAGNOSIS — Z23 Encounter for immunization: Secondary | ICD-10-CM | POA: Diagnosis not present

## 2015-12-22 DIAGNOSIS — G8929 Other chronic pain: Secondary | ICD-10-CM

## 2015-12-22 LAB — CBC
HEMATOCRIT: 39.4 % (ref 36.0–46.0)
Hemoglobin: 12.9 g/dL (ref 12.0–15.0)
MCHC: 32.9 g/dL (ref 30.0–36.0)
MCV: 83.6 fl (ref 78.0–100.0)
PLATELETS: 243 10*3/uL (ref 150.0–400.0)
RBC: 4.71 Mil/uL (ref 3.87–5.11)
RDW: 14.9 % (ref 11.5–15.5)
WBC: 5.9 10*3/uL (ref 4.0–10.5)

## 2015-12-22 LAB — PROTIME-INR
INR: 1 ratio (ref 0.8–1.0)
PROTHROMBIN TIME: 10.8 s (ref 9.6–13.1)

## 2015-12-22 LAB — COMPREHENSIVE METABOLIC PANEL
ALBUMIN: 4.2 g/dL (ref 3.5–5.2)
ALK PHOS: 93 U/L (ref 39–117)
ALT: 27 U/L (ref 0–35)
AST: 19 U/L (ref 0–37)
BUN: 15 mg/dL (ref 6–23)
CALCIUM: 9.3 mg/dL (ref 8.4–10.5)
CHLORIDE: 106 meq/L (ref 96–112)
CO2: 29 mEq/L (ref 19–32)
CREATININE: 0.99 mg/dL (ref 0.40–1.20)
GFR: 76.19 mL/min (ref >60.00)
Glucose, Bld: 92 mg/dL (ref 70–99)
POTASSIUM: 4.1 meq/L (ref 3.5–5.1)
Sodium: 141 mEq/L (ref 135–145)
Total Bilirubin: 0.8 mg/dL (ref 0.2–1.2)
Total Protein: 7.3 g/dL (ref 6.0–8.3)

## 2015-12-22 NOTE — Patient Instructions (Signed)
Thank you for choosing [facility].  SUMMARY AND INSTRUCTIONS:  Please return at your convenience for a blood pressure check.  We may need to increase your blood pressure medications.  You will be cleared for surgery pending your blood pressure and blood work.  Medication:  Your prescription(s) have been submitted to your pharmacy or been printed and provided for you. Please take as directed and contact our office if you believe you are having problem(s) with the medication(s) or have any questions.  Labs:  Please stop by the lab on the lower level of the building for your blood work. Your results will be released to [place] (or called to you) after review, usually within 72 hours after test completion. If any changes need to be made, you will be notified at that same time.  1.) The lab is open from 7:30am to 5:30 pm Monday-Friday 2.) No appointment is necessary 3.) Fasting (if needed) is 6-8 hours after food and drink; black coffee and water are okay   Follow up:  If your symptoms worsen or fail to improve, please contact our office for further instruction, or in case of emergency go directly to the emergency room at the closest medical facility.

## 2015-12-22 NOTE — Assessment & Plan Note (Signed)
Pre-operative medical history and physical exam performed. In office EKG shows normal sinus rhythm. Obtain CBC, CMET, and INR/PT. Blood pressure remains significantly elevated despite current treatment. Concern for increased pain versus elevated blood pressure. Surgical clearance will be provided pending improved blood pressure control and completion of lab work.

## 2015-12-22 NOTE — Progress Notes (Signed)
Subjective:    Patient ID: Kristen [name], female    DOB: [birth year], 50 y.o.   MRN: [id number]  Chief Complaint  Patient presents with  . Pre-op Exam    HPI:  Kristen [name] is a 50 y.o. female who  has a past medical history of Allergy; Anemia; Chicken pox; Depression; GERD (gastroesophageal reflux disease); H/O hiatal hernia; Headache(784.0); History of kidney stones; Hypertension; Migraine; OSA (obstructive sleep apnea) (10/13/2015); Scoliosis; and Shortness of breath. and presents today for an office visit for surgical clearance.   Continues to experience the associated symptom of pain located in her lower back that has failed previous conservative treatment and is scheduled to undergo a lumbar fusion by Dr. Louanne Skye.    Allergies  Allergen Reactions  . Other Hives and Swelling    tomato  . Sulfur Hives and Swelling      Outpatient Medications Prior to Visit  Medication Sig Dispense Refill  . baclofen (LIORESAL) 10 MG tablet Take 10 mg by mouth 3 (three) times daily.    . Bismuth Subsalicylate 975 MG TABS Take 2 tablets by mouth every 8 (eight) hours.    . diclofenac (VOLTAREN) 75 MG EC tablet Take 75 mg by mouth 2 (two) times daily as needed.    . gabapentin (NEURONTIN) 300 MG capsule Take 300 mg by mouth 3 (three) times daily.    Marland Kitchen ibuprofen (ADVIL,MOTRIN) 200 MG tablet Take 200-400 mg by mouth every 6 (six) hours as needed for fever, headache, mild pain, moderate pain or cramping.     . labetalol (NORMODYNE) 100 MG tablet Take 1 tablet (100 mg total) by mouth 2 (two) times daily. 90 tablet 1  . lidocaine (LIDODERM) 5 % Place 1 patch onto the skin daily. Remove & Discard patch within 12 hours or as directed by MD 6 patch 1  . lisinopril (PRINIVIL,ZESTRIL) 20 MG tablet Take 1 tablet (20 mg total) by mouth daily. 90 tablet 3  . loperamide (IMODIUM) 2 MG capsule Take by mouth 2 (two) times daily.    . pantoprazole (PROTONIX) 40 MG tablet Take 1 tablet (40 mg total) by mouth  daily. 30 tablet 3  . tiZANidine (ZANAFLEX) 4 MG tablet Take 4 mg by mouth 2 (two) times daily as needed for muscle spasms.    . traMADol (ULTRAM) 50 MG tablet Take 2 tablets (100 mg total) by mouth every 6 (six) hours as needed for moderate pain. 60 tablet 0   No facility-administered medications prior to visit.       Past Surgical History:  Procedure Laterality Date  . BACK SURGERY  07/21/1978  . CHOLECYSTECTOMY N/A 08/15/2013   Procedure: LAPAROSCOPIC CHOLECYSTECTOMY WITH INTRAOPERATIVE CHOLANGIOGRAM;  Surgeon: Gwenyth Ober, MD;  Location: [place];  Service: General;  Laterality: N/A;  . COLONOSCOPY N/A 01/09/2013   Procedure: COLONOSCOPY;  Surgeon: Beryle Beams, MD;  Location: [place];  Service: Endoscopy;  Laterality: N/A;  . HERNIA REPAIR Left 1981  . TONSILLECTOMY    . TUBAL LIGATION  06/1988      Past Medical History:  Diagnosis Date  . Allergy   . Anemia    Iron deficiency  . Chicken pox   . Depression   . GERD (gastroesophageal reflux disease)   . H/O hiatal hernia   . Headache(784.0)   . History of kidney stones    1996ish  . Hypertension   . Migraine   . OSA (obstructive sleep apnea) 10/13/2015  . Scoliosis   .  Shortness of breath    with exertion      Review of Systems    Constitutional: Denies fever, chills, fatigue, or significant weight gain/loss. HENT: Head: Denies headache or neck pain Ears: Denies changes in hearing, ringing in ears, earache, drainage Nose: Denies discharge, stuffiness, itching, nosebleed, sinus pain Throat: Denies sore throat, hoarseness, dry mouth, sores, thrush Eyes: Denies loss/changes in vision, pain, redness, blurry/double vision, flashing lights Cardiovascular: Denies chest pain/discomfort, tightness, palpitations, shortness of breath with activity, difficulty lying down, swelling, sudden awakening with shortness of breath Respiratory: Denies shortness of breath, cough, sputum production, wheezing Gastrointestinal:  Denies dysphasia, heartburn, change in appetite, nausea, change in bowel habits, rectal bleeding, constipation, diarrhea, yellow skin or eyes Genitourinary: Denies frequency, urgency, burning/pain, blood in urine, incontinence, change in urinary strength. Musculoskeletal: Denies muscle/joint pain, stiffness, back pain, redness or swelling of joints, trauma Positive for low back pain with radiculopathy. Skin: Denies rashes, lumps, itching, dryness, color changes, or hair/nail changes Neurological: Denies dizziness, fainting, seizures, weakness, numbness, tingling, tremor Psychiatric - Denies nervousness, stress, depression or memory loss Endocrine: Denies heat or cold intolerance, sweating, frequent urination, excessive thirst, changes in appetite Hematologic: Denies ease of bruising or bleeding  Objective:    BP (!) 150/110   Pulse 70   Temp 98.2 F (36.8 C) (Oral)   Resp 16   Ht 5' 9"  (1.753 m)   Wt 265 lb (120.2 kg)   SpO2 98%   BMI 39.13 kg/m  Nursing note and vital signs reviewed.  BP Readings from Last 3 Encounters:  12/22/15 (!) 150/110  12/03/15 (!) 157/99  09/10/15 118/80    Physical Exam  Constitutional: She is oriented to person, place, and time. She appears well-developed and well-nourished.  HENT:  Head: Normocephalic.  Right Ear: Hearing, tympanic membrane, external ear and ear canal normal.  Left Ear: Hearing, tympanic membrane, external ear and ear canal normal.  Nose: Nose normal.  Mouth/Throat: Uvula is midline, oropharynx is clear and moist and mucous membranes are normal.  Eyes: Conjunctivae and EOM are normal. Pupils are equal, round, and reactive to light.  Neck: Neck supple. No JVD present. No tracheal deviation present. No thyromegaly present.  Cardiovascular: Normal rate, regular rhythm, normal heart sounds and intact distal pulses.   Pulmonary/Chest: Effort normal and breath sounds normal.  Abdominal: Soft. Bowel sounds are normal. She exhibits no  distension and no mass. There is no tenderness. There is no rebound and no guarding.  Musculoskeletal: Normal range of motion. She exhibits no edema or tenderness.  Lymphadenopathy:    She has no cervical adenopathy.  Neurological: She is alert and oriented to person, place, and time. She has normal reflexes. No cranial nerve deficit. She exhibits normal muscle tone. Coordination normal.  Skin: Skin is warm and dry.  Psychiatric: She has a normal mood and affect. Her behavior is normal. Judgment and thought content normal.       Assessment & Plan:   Problem List Items Addressed This Visit      Cardiovascular and Mediastinum   HTN (hypertension) (Chronic)    Hypertension above goal of 140/90 with current regimen. Denies worst headache of life or new symptoms of end organ damage. Encouraged to monitor blood pressure at home and follow up for nurse visit to recheck. Continue current dosage of labetolol and lisinopril. Follow low sodium diet.       Relevant Orders   CBC (Completed)   Comprehensive metabolic panel (Completed)   INR/PT (Completed)  Other   Low back pain   Relevant Orders   CBC (Completed)   Comprehensive metabolic panel (Completed)   INR/PT (Completed)   Pre-op examination - Primary    Pre-operative medical history and physical exam performed. In office EKG shows normal sinus rhythm. Obtain CBC, CMET, and INR/PT. Blood pressure remains significantly elevated despite current treatment. Concern for increased pain versus elevated blood pressure. Surgical clearance will be provided pending improved blood pressure control and completion of lab work.       Relevant Orders   EKG 12-Lead (Completed)   CBC (Completed)   Comprehensive metabolic panel (Completed)   INR/PT (Completed)    Other Visit Diagnoses    Encounter for immunization       Relevant Orders   Flu Vaccine QUAD 36+ mos IM (Completed)       I am having Ms. Shaheed maintain her ibuprofen, Bismuth  Subsalicylate, diclofenac, tiZANidine, lidocaine, loperamide, baclofen, gabapentin, lisinopril, labetalol, pantoprazole, and traMADol.   Follow-up: Return in about 3 days (around 12/25/2015), or if symptoms worsen or fail to improve.  Mauricio Po, FNP

## 2015-12-22 NOTE — Assessment & Plan Note (Signed)
Hypertension above goal of 140/90 with current regimen. Denies worst headache of life or new symptoms of end organ damage. Encouraged to monitor blood pressure at home and follow up for nurse visit to recheck. Continue current dosage of labetolol and lisinopril. Follow low sodium diet.

## 2015-12-26 ENCOUNTER — Other Ambulatory Visit: Payer: Self-pay

## 2015-12-26 ENCOUNTER — Ambulatory Visit (INDEPENDENT_AMBULATORY_CARE_PROVIDER_SITE_OTHER): Payer: BC Managed Care – PPO

## 2015-12-26 VITALS — BP 140/88

## 2015-12-26 DIAGNOSIS — I10 Essential (primary) hypertension: Secondary | ICD-10-CM

## 2015-12-26 NOTE — Telephone Encounter (Signed)
.  A user error has taken place: error

## 2015-12-26 NOTE — Progress Notes (Signed)
Patient ID: Kristen [name], female   DOB: Oct 26, 1965, 51 y.o.   MRN: [id number] Medical treatment/procedure(s) were performed by non-physician practitioner and as supervising physician I was immediately available for consultation/collaboration. I agree with above. Hoyt Koch, MD

## 2015-12-26 NOTE — Progress Notes (Signed)
Pt was advised at last OV (12/22/2015) by Terri Piedra FNP to come back in 3 days for BP check. BP stable, pt asymptomatic. She was advised to continue taking medications as prescribed and to follow PRN

## 2015-12-28 ENCOUNTER — Other Ambulatory Visit (INDEPENDENT_AMBULATORY_CARE_PROVIDER_SITE_OTHER): Payer: Self-pay | Admitting: Specialist

## 2015-12-28 DIAGNOSIS — M5136 Other intervertebral disc degeneration, lumbar region: Secondary | ICD-10-CM

## 2015-12-28 DIAGNOSIS — M48062 Spinal stenosis, lumbar region with neurogenic claudication: Secondary | ICD-10-CM

## 2015-12-29 MED ORDER — TRAMADOL HCL 50 MG PO TABS: 100.0000 mg | ORAL_TABLET | Freq: Four times a day (QID) | ORAL | 0 refills | Status: DC | PRN

## 2015-12-30 NOTE — Telephone Encounter (Signed)
Called to pharm 

## 2016-01-01 ENCOUNTER — Telehealth (INDEPENDENT_AMBULATORY_CARE_PROVIDER_SITE_OTHER): Payer: Self-pay

## 2016-01-01 ENCOUNTER — Encounter: Payer: Self-pay | Admitting: Family

## 2016-01-01 NOTE — Telephone Encounter (Signed)
The note was faxed.

## 2016-01-01 NOTE — Telephone Encounter (Signed)
We will send the medical clearance note over shortly.

## 2016-01-01 NOTE — Telephone Encounter (Signed)
Patient called and stated she was seen by PCP and thinks she is cleared for surgery.  Please advise if you have cleared her for lumbar fusion surgery.  Thank you!

## 2016-01-07 ENCOUNTER — Encounter (INDEPENDENT_AMBULATORY_CARE_PROVIDER_SITE_OTHER): Payer: Self-pay | Admitting: Specialist

## 2016-01-09 ENCOUNTER — Other Ambulatory Visit (HOSPITAL_COMMUNITY): Payer: Self-pay | Admitting: *Deleted

## 2016-01-09 NOTE — Pre-Procedure Instructions (Signed)
Kristen [name]  01/09/2016    Your procedure is scheduled on Tuesday, January 20, 2016 at 7:30 AM.   Report to [facility] Entrance "A" Admitting Office at 5:30 AM.   Call this number if you have problems the morning of surgery: [phone]   Questions prior to day of surgery, please call [phone] between 8 & 4 PM.   Remember:  Do not eat food or drink liquids after midnight Monday, 01/19/16.  Take these medicines the morning of surgery with A SIP OF WATER: Amlodipine, Gabapentin, Labetalol (Normodyne), Pantoprazole (Protonix), Tramadol - if needed  Stop NSAIDS (Ibuprofen, Voltaren, Aleve, etc.) 5 days prior to surgery. Do not use Aspirin products 5 days prior to surgery.   Do not wear jewelry, make-up or nail polish.  Do not wear lotions, powders, or perfumes.  Do not shave 48 hours prior to surgery.   Do not bring valuables to the hospital.  [facility] is not responsible for any belongings or valuables.  Contacts, dentures or bridgework may not be worn into surgery.  Leave your suitcase in the car.  After surgery it may be brought to your room.  For patients admitted to the hospital, discharge time will be determined by your treatment team.  Special instructions:  [facility] - Preparing for Surgery  Before surgery, you can play an important role.  Because skin is not sterile, your skin needs to be as free of germs as possible.  You can reduce the number of germs on you skin by washing with CHG (chlorahexidine gluconate) soap before surgery.  CHG is an antiseptic cleaner which kills germs and bonds with the skin to continue killing germs even after washing.  Please DO NOT use if you have an allergy to CHG or antibacterial soaps.  If your skin becomes reddened/irritated stop using the CHG and inform your nurse when you arrive at Short Stay.  Do not shave (including legs and underarms) for at least 48 hours prior to the first CHG shower.  You may shave your  face.  Please follow these instructions carefully:   1.  Shower with CHG Soap the night before surgery and the                    morning of Surgery.  2.  If you choose to wash your hair, wash your hair first as usual with your       normal shampoo.  3.  After you shampoo, rinse your hair and body thoroughly to remove the shampoo.  4.  Use CHG as you would any other liquid soap.  You can apply chg directly       to the skin and wash gently with scrungie or a clean washcloth.  5.  Apply the CHG Soap to your body ONLY FROM THE NECK DOWN.        Do not use on open wounds or open sores.  Avoid contact with your eyes, ears, mouth and genitals (private parts).  Wash genitals (private parts) with your normal soap.  6.  Wash thoroughly, paying special attention to the area where your surgery        will be performed.  7.  Thoroughly rinse your body with warm water from the neck down.  8.  DO NOT shower/wash with your normal soap after using and rinsing off       the CHG Soap.  9.  Pat yourself dry with a clean towel.  10.  Wear clean pajamas.            11.  Place clean sheets on your bed the night of your first shower and do not        sleep with pets.  Day of Surgery  Do not apply any lotions the morning of surgery.  Please wear clean clothes to the hospital.   Please read over the fact sheets that you were given.

## 2016-01-13 ENCOUNTER — Encounter (HOSPITAL_COMMUNITY)
Admission: RE | Admit: 2016-01-13 | Discharge: 2016-01-13 | Disposition: A | Discharge status: Home / Self Care | Payer: BC Managed Care – PPO | Source: Ambulatory Visit | Attending: Specialist | Admitting: Specialist

## 2016-01-13 ENCOUNTER — Encounter (HOSPITAL_COMMUNITY): Payer: Self-pay

## 2016-01-13 DIAGNOSIS — Z01812 Encounter for preprocedural laboratory examination: Secondary | ICD-10-CM | POA: Insufficient documentation

## 2016-01-13 DIAGNOSIS — M419 Scoliosis, unspecified: Secondary | ICD-10-CM | POA: Insufficient documentation

## 2016-01-13 DIAGNOSIS — I10 Essential (primary) hypertension: Secondary | ICD-10-CM | POA: Diagnosis not present

## 2016-01-13 DIAGNOSIS — K219 Gastro-esophageal reflux disease without esophagitis: Secondary | ICD-10-CM | POA: Diagnosis not present

## 2016-01-13 DIAGNOSIS — Z79899 Other long term (current) drug therapy: Secondary | ICD-10-CM | POA: Diagnosis not present

## 2016-01-13 DIAGNOSIS — F329 Major depressive disorder, single episode, unspecified: Secondary | ICD-10-CM | POA: Diagnosis not present

## 2016-01-13 HISTORY — DX: Unspecified osteoarthritis, unspecified site: M19.90

## 2016-01-13 HISTORY — DX: Diarrhea, unspecified: R19.7

## 2016-01-13 HISTORY — DX: Restless legs syndrome: G25.81

## 2016-01-13 HISTORY — DX: Pneumonia, unspecified organism: J18.9

## 2016-01-13 HISTORY — DX: Anxiety disorder, unspecified: F41.9

## 2016-01-13 LAB — COMPREHENSIVE METABOLIC PANEL
ALK PHOS: 94 U/L (ref 38–126)
ALT: 23 U/L (ref 14–54)
ANION GAP: 9 (ref 5–15)
AST: 19 U/L (ref 15–41)
Albumin: 3.8 g/dL (ref 3.5–5.0)
BUN: 17 mg/dL (ref 6–20)
CALCIUM: 9.2 mg/dL (ref 8.9–10.3)
CO2: 24 mmol/L (ref 22–32)
CREATININE: 0.99 mg/dL (ref 0.44–1.00)
Chloride: 109 mmol/L (ref 101–111)
GFR calc non Af Amer: >60 mL/min (ref >60)
GLUCOSE: 104 mg/dL — AB (ref 65–99)
Potassium: 4.1 mmol/L (ref 3.5–5.1)
Sodium: 142 mmol/L (ref 135–145)
TOTAL PROTEIN: 6.9 g/dL (ref 6.5–8.1)
Total Bilirubin: 0.7 mg/dL (ref 0.3–1.2)

## 2016-01-13 LAB — URINALYSIS, ROUTINE W REFLEX MICROSCOPIC
Bilirubin Urine: NEGATIVE
GLUCOSE, UA: NEGATIVE mg/dL
KETONES UR: NEGATIVE mg/dL
Nitrite: POSITIVE — AB
PROTEIN: NEGATIVE mg/dL
Specific Gravity, Urine: 1.013 (ref 1.005–1.030)
pH: 6 (ref 5.0–8.0)

## 2016-01-13 LAB — PROTIME-INR
INR: 0.94
Prothrombin Time: 12.5 seconds (ref 11.4–15.2)

## 2016-01-13 LAB — CBC
HEMATOCRIT: 40.4 % (ref 36.0–46.0)
HEMOGLOBIN: 13.1 g/dL (ref 12.0–15.0)
MCH: 27.2 pg (ref 26.0–34.0)
MCHC: 32.4 g/dL (ref 30.0–36.0)
MCV: 83.8 fL (ref 78.0–100.0)
Platelets: 194 10*3/uL (ref 150–400)
RBC: 4.82 MIL/uL (ref 3.87–5.11)
RDW: 13.7 % (ref 11.5–15.5)
WBC: 6.2 10*3/uL (ref 4.0–10.5)

## 2016-01-13 LAB — APTT: aPTT: 29 seconds (ref 24–36)

## 2016-01-13 LAB — SURGICAL PCR SCREEN
MRSA, PCR: NEGATIVE
STAPHYLOCOCCUS AUREUS: NEGATIVE

## 2016-01-13 LAB — ABO/RH: ABO/RH(D): B POS

## 2016-01-13 NOTE — Progress Notes (Signed)
Pt denies cardiac history, chest pain or sob. Pt has medical clearance from her PCP in [place].

## 2016-01-19 MED ORDER — CEFAZOLIN SODIUM-DEXTROSE 2-4 GM/100ML-% IV SOLN
2.0000 g | INTRAVENOUS | Status: AC
  Administered 2016-01-20 (×3): 2 g via INTRAVENOUS
  Filled 2016-01-19: qty 100

## 2016-01-20 ENCOUNTER — Inpatient Hospital Stay (HOSPITAL_COMMUNITY)
Admission: RE | Admit: 2016-01-20 | Discharge: 2016-01-26 | DRG: 460 | Disposition: A | Discharge status: Home Health | Payer: BC Managed Care – PPO | Source: Ambulatory Visit | Attending: Specialist | Admitting: Specialist

## 2016-01-20 ENCOUNTER — Encounter (HOSPITAL_COMMUNITY): Payer: Self-pay | Admitting: *Deleted

## 2016-01-20 ENCOUNTER — Encounter (HOSPITAL_COMMUNITY)
Admission: RE | Disposition: A | Discharge status: Home Health | Payer: Self-pay | Source: Ambulatory Visit | Attending: Specialist

## 2016-01-20 ENCOUNTER — Inpatient Hospital Stay (HOSPITAL_COMMUNITY): Payer: BC Managed Care – PPO

## 2016-01-20 ENCOUNTER — Inpatient Hospital Stay (HOSPITAL_COMMUNITY): Payer: BC Managed Care – PPO | Admitting: Anesthesiology

## 2016-01-20 DIAGNOSIS — K2100 Gastro-esophageal reflux disease with esophagitis, without bleeding: Secondary | ICD-10-CM

## 2016-01-20 DIAGNOSIS — G4719 Other hypersomnia: Secondary | ICD-10-CM

## 2016-01-20 DIAGNOSIS — Z419 Encounter for procedure for purposes other than remedying health state, unspecified: Secondary | ICD-10-CM

## 2016-01-20 DIAGNOSIS — K8020 Calculus of gallbladder without cholecystitis without obstruction: Secondary | ICD-10-CM

## 2016-01-20 DIAGNOSIS — M5136 Other intervertebral disc degeneration, lumbar region: Secondary | ICD-10-CM

## 2016-01-20 DIAGNOSIS — R072 Precordial pain: Secondary | ICD-10-CM | POA: Diagnosis not present

## 2016-01-20 DIAGNOSIS — K529 Noninfective gastroenteritis and colitis, unspecified: Secondary | ICD-10-CM | POA: Diagnosis present

## 2016-01-20 DIAGNOSIS — F329 Major depressive disorder, single episode, unspecified: Secondary | ICD-10-CM | POA: Diagnosis present

## 2016-01-20 DIAGNOSIS — Z6841 Body Mass Index (BMI) 40.0 and over, adult: Secondary | ICD-10-CM | POA: Diagnosis not present

## 2016-01-20 DIAGNOSIS — D6959 Other secondary thrombocytopenia: Secondary | ICD-10-CM | POA: Diagnosis present

## 2016-01-20 DIAGNOSIS — D638 Anemia in other chronic diseases classified elsewhere: Secondary | ICD-10-CM | POA: Diagnosis present

## 2016-01-20 DIAGNOSIS — D696 Thrombocytopenia, unspecified: Secondary | ICD-10-CM | POA: Diagnosis present

## 2016-01-20 DIAGNOSIS — F419 Anxiety disorder, unspecified: Secondary | ICD-10-CM | POA: Diagnosis present

## 2016-01-20 DIAGNOSIS — R079 Chest pain, unspecified: Secondary | ICD-10-CM | POA: Diagnosis not present

## 2016-01-20 DIAGNOSIS — R252 Cramp and spasm: Secondary | ICD-10-CM

## 2016-01-20 DIAGNOSIS — K219 Gastro-esophageal reflux disease without esophagitis: Secondary | ICD-10-CM | POA: Diagnosis present

## 2016-01-20 DIAGNOSIS — Z01818 Encounter for other preprocedural examination: Secondary | ICD-10-CM

## 2016-01-20 DIAGNOSIS — E861 Hypovolemia: Secondary | ICD-10-CM | POA: Diagnosis not present

## 2016-01-20 DIAGNOSIS — G4733 Obstructive sleep apnea (adult) (pediatric): Secondary | ICD-10-CM | POA: Diagnosis present

## 2016-01-20 DIAGNOSIS — M48061 Spinal stenosis, lumbar region without neurogenic claudication: Secondary | ICD-10-CM

## 2016-01-20 DIAGNOSIS — R0789 Other chest pain: Secondary | ICD-10-CM | POA: Diagnosis not present

## 2016-01-20 DIAGNOSIS — M48062 Spinal stenosis, lumbar region with neurogenic claudication: Secondary | ICD-10-CM | POA: Diagnosis present

## 2016-01-20 DIAGNOSIS — I10 Essential (primary) hypertension: Secondary | ICD-10-CM | POA: Diagnosis not present

## 2016-01-20 DIAGNOSIS — Z79899 Other long term (current) drug therapy: Secondary | ICD-10-CM | POA: Diagnosis not present

## 2016-01-20 DIAGNOSIS — G4710 Hypersomnia, unspecified: Secondary | ICD-10-CM | POA: Diagnosis present

## 2016-01-20 DIAGNOSIS — G8929 Other chronic pain: Secondary | ICD-10-CM

## 2016-01-20 DIAGNOSIS — Z09 Encounter for follow-up examination after completed treatment for conditions other than malignant neoplasm: Secondary | ICD-10-CM

## 2016-01-20 DIAGNOSIS — D649 Anemia, unspecified: Secondary | ICD-10-CM | POA: Diagnosis present

## 2016-01-20 DIAGNOSIS — D62 Acute posthemorrhagic anemia: Secondary | ICD-10-CM | POA: Diagnosis not present

## 2016-01-20 DIAGNOSIS — M5116 Intervertebral disc disorders with radiculopathy, lumbar region: Secondary | ICD-10-CM | POA: Diagnosis present

## 2016-01-20 DIAGNOSIS — M545 Low back pain: Secondary | ICD-10-CM | POA: Diagnosis present

## 2016-01-20 DIAGNOSIS — I951 Orthostatic hypotension: Secondary | ICD-10-CM | POA: Diagnosis not present

## 2016-01-20 DIAGNOSIS — M5441 Lumbago with sciatica, right side: Secondary | ICD-10-CM

## 2016-01-20 DIAGNOSIS — B029 Zoster without complications: Secondary | ICD-10-CM

## 2016-01-20 HISTORY — PX: SPINE SURGERY: SHX786

## 2016-01-20 LAB — BASIC METABOLIC PANEL
ANION GAP: 9 (ref 5–15)
BUN: 16 mg/dL (ref 6–20)
CALCIUM: 7.9 mg/dL — AB (ref 8.9–10.3)
CO2: 23 mmol/L (ref 22–32)
Chloride: 107 mmol/L (ref 101–111)
Creatinine, Ser: 1.23 mg/dL — ABNORMAL HIGH (ref 0.44–1.00)
GFR calc Af Amer: 58 mL/min — ABNORMAL LOW (ref >60)
GFR calc non Af Amer: 50 mL/min — ABNORMAL LOW (ref >60)
GLUCOSE: 166 mg/dL — AB (ref 65–99)
Potassium: 4.6 mmol/L (ref 3.5–5.1)
Sodium: 139 mmol/L (ref 135–145)

## 2016-01-20 LAB — HEMOGLOBIN AND HEMATOCRIT, BLOOD
HCT: 36.7 % (ref 36.0–46.0)
Hemoglobin: 11.5 g/dL — ABNORMAL LOW (ref 12.0–15.0)

## 2016-01-20 LAB — PREPARE RBC (CROSSMATCH)

## 2016-01-20 SURGERY — POSTERIOR LUMBAR FUSION 2 WITH HARDWARE REMOVAL
Anesthesia: General | Site: Spine Lumbar

## 2016-01-20 MED ORDER — TIZANIDINE HCL 4 MG PO TABS
4.0000 mg | ORAL_TABLET | Freq: Two times a day (BID) | ORAL | Status: DC | PRN
  Administered 2016-01-23 – 2016-01-25 (×3): 4 mg via ORAL
  Filled 2016-01-20: qty 2
  Filled 2016-01-20: qty 1
  Filled 2016-01-20: qty 2

## 2016-01-20 MED ORDER — FENTANYL CITRATE (PF) 100 MCG/2ML IJ SOLN
INTRAMUSCULAR | Status: AC
  Filled 2016-01-20: qty 4

## 2016-01-20 MED ORDER — KETOROLAC TROMETHAMINE 30 MG/ML IJ SOLN
INTRAMUSCULAR | Status: AC
  Filled 2016-01-20: qty 1

## 2016-01-20 MED ORDER — LIDOCAINE HCL (CARDIAC) 20 MG/ML IV SOLN
INTRAVENOUS | Status: DC | PRN
  Administered 2016-01-20: 100 mg via INTRAVENOUS

## 2016-01-20 MED ORDER — DEXAMETHASONE SODIUM PHOSPHATE 10 MG/ML IJ SOLN
INTRAMUSCULAR | Status: DC | PRN
  Administered 2016-01-20: 10 mg via INTRAVENOUS

## 2016-01-20 MED ORDER — CEFAZOLIN SODIUM-DEXTROSE 2-4 GM/100ML-% IV SOLN
2.0000 g | Freq: Three times a day (TID) | INTRAVENOUS | Status: AC
  Administered 2016-01-20 – 2016-01-21 (×2): 2 g via INTRAVENOUS
  Filled 2016-01-20 (×2): qty 100

## 2016-01-20 MED ORDER — LIDOCAINE 2% (20 MG/ML) 5 ML SYRINGE
INTRAMUSCULAR | Status: AC
  Filled 2016-01-20: qty 5

## 2016-01-20 MED ORDER — SODIUM CHLORIDE 0.9 % IV SOLN: 250.0000 mL | INTRAVENOUS | Status: DC

## 2016-01-20 MED ORDER — DEXAMETHASONE SODIUM PHOSPHATE 10 MG/ML IJ SOLN
INTRAMUSCULAR | Status: AC
  Filled 2016-01-20: qty 1

## 2016-01-20 MED ORDER — BUPIVACAINE LIPOSOME 1.3 % IJ SUSP
20.0000 mL | INTRAMUSCULAR | Status: AC
  Administered 2016-01-20: 20 mL
  Filled 2016-01-20: qty 20

## 2016-01-20 MED ORDER — FENTANYL CITRATE (PF) 100 MCG/2ML IJ SOLN
INTRAMUSCULAR | Status: AC
  Filled 2016-01-20: qty 2

## 2016-01-20 MED ORDER — METHOCARBAMOL 500 MG PO TABS
500.0000 mg | ORAL_TABLET | Freq: Four times a day (QID) | ORAL | Status: DC | PRN
  Administered 2016-01-23 – 2016-01-25 (×5): 500 mg via ORAL
  Filled 2016-01-20 (×5): qty 1

## 2016-01-20 MED ORDER — HYDROMORPHONE HCL 1 MG/ML IJ SOLN
0.2500 mg | INTRAMUSCULAR | Status: DC | PRN
  Administered 2016-01-20: 0.5 mg via INTRAVENOUS
  Administered 2016-01-20 (×2): 0.25 mg via INTRAVENOUS

## 2016-01-20 MED ORDER — SODIUM CHLORIDE 0.9 % IV SOLN: 10.0000 mL/h | Freq: Once | INTRAVENOUS | Status: DC

## 2016-01-20 MED ORDER — LOPERAMIDE HCL 2 MG PO CAPS: 2.0000 mg | ORAL_CAPSULE | Freq: Two times a day (BID) | ORAL | Status: DC | PRN

## 2016-01-20 MED ORDER — PHENYLEPHRINE HCL 10 MG/ML IJ SOLN
INTRAMUSCULAR | Status: DC | PRN
  Administered 2016-01-20: 80 ug via INTRAVENOUS
  Administered 2016-01-20: 200 ug via INTRAVENOUS
  Administered 2016-01-20: 80 ug via INTRAVENOUS
  Administered 2016-01-20: 120 ug via INTRAVENOUS
  Administered 2016-01-20 (×3): 80 ug via INTRAVENOUS

## 2016-01-20 MED ORDER — LACTATED RINGERS IV SOLN
INTRAVENOUS | Status: DC | PRN
  Administered 2016-01-20 (×4): via INTRAVENOUS

## 2016-01-20 MED ORDER — CEFAZOLIN SODIUM 1 G IJ SOLR
INTRAMUSCULAR | Status: AC
  Filled 2016-01-20: qty 20

## 2016-01-20 MED ORDER — AMLODIPINE BESYLATE 10 MG PO TABS
10.0000 mg | ORAL_TABLET | Freq: Every day | ORAL | Status: DC
  Administered 2016-01-21: 10 mg via ORAL
  Filled 2016-01-20: qty 1

## 2016-01-20 MED ORDER — LISINOPRIL 20 MG PO TABS
20.0000 mg | ORAL_TABLET | Freq: Every day | ORAL | Status: DC
  Administered 2016-01-21: 20 mg via ORAL
  Filled 2016-01-20: qty 1

## 2016-01-20 MED ORDER — ONDANSETRON HCL 4 MG/2ML IJ SOLN: 4.0000 mg | INTRAMUSCULAR | Status: DC | PRN

## 2016-01-20 MED ORDER — KETOROLAC TROMETHAMINE 30 MG/ML IJ SOLN
30.0000 mg | Freq: Once | INTRAMUSCULAR | Status: AC
  Administered 2016-01-20: 30 mg via INTRAVENOUS

## 2016-01-20 MED ORDER — MEPERIDINE HCL 25 MG/ML IJ SOLN: 6.2500 mg | INTRAMUSCULAR | Status: DC | PRN

## 2016-01-20 MED ORDER — PHENOL 1.4 % MT LIQD: 1.0000 | OROMUCOSAL | Status: DC | PRN

## 2016-01-20 MED ORDER — PROPOFOL 10 MG/ML IV BOLUS
INTRAVENOUS | Status: DC | PRN
  Administered 2016-01-20: 150 mg via INTRAVENOUS
  Administered 2016-01-20: 50 mg via INTRAVENOUS

## 2016-01-20 MED ORDER — ACETAMINOPHEN 325 MG PO TABS: 650.0000 mg | ORAL_TABLET | ORAL | Status: DC | PRN

## 2016-01-20 MED ORDER — SODIUM CHLORIDE 0.9 % IV SOLN
INTRAVENOUS | Status: DC | PRN
  Administered 2016-01-20 (×2): via INTRAVENOUS

## 2016-01-20 MED ORDER — PROPOFOL 500 MG/50ML IV EMUL
INTRAVENOUS | Status: DC | PRN
  Administered 2016-01-20: 50 ug/kg/min via INTRAVENOUS
  Administered 2016-01-20 (×2): via INTRAVENOUS

## 2016-01-20 MED ORDER — POLYETHYLENE GLYCOL 3350 17 G PO PACK: 17.0000 g | PACK | Freq: Every day | ORAL | Status: DC | PRN

## 2016-01-20 MED ORDER — ONDANSETRON HCL 4 MG/2ML IJ SOLN: 4.0000 mg | Freq: Once | INTRAMUSCULAR | Status: DC | PRN

## 2016-01-20 MED ORDER — PROPOFOL 10 MG/ML IV BOLUS
INTRAVENOUS | Status: AC
  Filled 2016-01-20: qty 40

## 2016-01-20 MED ORDER — MIDAZOLAM HCL 2 MG/2ML IJ SOLN
INTRAMUSCULAR | Status: AC
  Filled 2016-01-20: qty 2

## 2016-01-20 MED ORDER — MIDAZOLAM HCL 5 MG/5ML IJ SOLN
INTRAMUSCULAR | Status: DC | PRN
  Administered 2016-01-20: 2 mg via INTRAVENOUS

## 2016-01-20 MED ORDER — DOCUSATE SODIUM 100 MG PO CAPS
100.0000 mg | ORAL_CAPSULE | Freq: Two times a day (BID) | ORAL | Status: DC
  Administered 2016-01-21 – 2016-01-26 (×10): 100 mg via ORAL
  Filled 2016-01-20 (×12): qty 1

## 2016-01-20 MED ORDER — SUCCINYLCHOLINE CHLORIDE 20 MG/ML IJ SOLN
INTRAMUSCULAR | Status: DC | PRN
Start: 2016-01-20 — End: 2016-01-20
  Administered 2016-01-20: 140 mg via INTRAVENOUS

## 2016-01-20 MED ORDER — EPHEDRINE 5 MG/ML INJ
INTRAVENOUS | Status: AC
  Filled 2016-01-20: qty 10

## 2016-01-20 MED ORDER — MENTHOL 3 MG MT LOZG: 1.0000 | LOZENGE | OROMUCOSAL | Status: DC | PRN

## 2016-01-20 MED ORDER — METHOCARBAMOL 1000 MG/10ML IJ SOLN
500.0000 mg | Freq: Four times a day (QID) | INTRAVENOUS | Status: DC | PRN
  Filled 2016-01-20: qty 5

## 2016-01-20 MED ORDER — BUPIVACAINE HCL 0.5 % IJ SOLN
INTRAMUSCULAR | Status: DC | PRN
  Administered 2016-01-20: 20 mL

## 2016-01-20 MED ORDER — ALBUMIN HUMAN 5 % IV SOLN
INTRAVENOUS | Status: DC | PRN
  Administered 2016-01-20 (×3): via INTRAVENOUS

## 2016-01-20 MED ORDER — HYDROCODONE-ACETAMINOPHEN 5-325 MG PO TABS
1.0000 | ORAL_TABLET | ORAL | Status: DC | PRN
  Administered 2016-01-23 – 2016-01-24 (×3): 2 via ORAL
  Administered 2016-01-24: 1 via ORAL
  Administered 2016-01-26: 2 via ORAL
  Filled 2016-01-20: qty 1
  Filled 2016-01-20 (×4): qty 2

## 2016-01-20 MED ORDER — ONDANSETRON HCL 4 MG/2ML IJ SOLN
INTRAMUSCULAR | Status: AC
  Filled 2016-01-20: qty 2

## 2016-01-20 MED ORDER — HYDROMORPHONE HCL 1 MG/ML IJ SOLN
INTRAMUSCULAR | Status: AC
  Administered 2016-01-20: 0.25 mg via INTRAVENOUS
  Filled 2016-01-20: qty 0.5

## 2016-01-20 MED ORDER — OXYCODONE-ACETAMINOPHEN 5-325 MG PO TABS
1.0000 | ORAL_TABLET | ORAL | Status: DC | PRN
  Administered 2016-01-20 – 2016-01-25 (×17): 2 via ORAL
  Filled 2016-01-20 (×17): qty 2

## 2016-01-20 MED ORDER — ONDANSETRON HCL 4 MG/2ML IJ SOLN
INTRAMUSCULAR | Status: DC | PRN
  Administered 2016-01-20: 4 mg via INTRAVENOUS

## 2016-01-20 MED ORDER — CHLORHEXIDINE GLUCONATE 4 % EX LIQD: 60.0000 mL | Freq: Once | CUTANEOUS | Status: DC

## 2016-01-20 MED ORDER — HEMOSTATIC AGENTS (NO CHARGE) OPTIME
TOPICAL | Status: DC | PRN
  Administered 2016-01-20: 1 via TOPICAL

## 2016-01-20 MED ORDER — FLEET ENEMA 7-19 GM/118ML RE ENEM: 1.0000 | ENEMA | Freq: Once | RECTAL | Status: DC | PRN

## 2016-01-20 MED ORDER — SODIUM CHLORIDE 0.9% FLUSH: 3.0000 mL | INTRAVENOUS | Status: DC | PRN

## 2016-01-20 MED ORDER — SODIUM CHLORIDE 0.9% FLUSH
3.0000 mL | Freq: Two times a day (BID) | INTRAVENOUS | Status: DC
  Administered 2016-01-20 – 2016-01-25 (×10): 3 mL via INTRAVENOUS

## 2016-01-20 MED ORDER — SURGIFOAM 100 EX MISC
CUTANEOUS | Status: DC | PRN
  Administered 2016-01-20: 20 mL via TOPICAL

## 2016-01-20 MED ORDER — SODIUM CHLORIDE 0.9 % IV SOLN
INTRAVENOUS | Status: DC
  Administered 2016-01-20 – 2016-01-22 (×3): via INTRAVENOUS

## 2016-01-20 MED ORDER — LABETALOL HCL 100 MG PO TABS
100.0000 mg | ORAL_TABLET | Freq: Two times a day (BID) | ORAL | Status: DC
  Administered 2016-01-21: 100 mg via ORAL
  Filled 2016-01-20 (×2): qty 1

## 2016-01-20 MED ORDER — PHENYLEPHRINE HCL 10 MG/ML IJ SOLN
INTRAVENOUS | Status: DC | PRN
  Administered 2016-01-20: 11:00:00 via INTRAVENOUS
  Administered 2016-01-20: 20 ug/min via INTRAVENOUS

## 2016-01-20 MED ORDER — BISACODYL 5 MG PO TBEC
5.0000 mg | DELAYED_RELEASE_TABLET | Freq: Every day | ORAL | Status: DC | PRN
  Administered 2016-01-25: 5 mg via ORAL
  Filled 2016-01-20: qty 1

## 2016-01-20 MED ORDER — MORPHINE SULFATE (PF) 2 MG/ML IV SOLN: 1.0000 mg | INTRAVENOUS | Status: DC | PRN

## 2016-01-20 MED ORDER — ACETAMINOPHEN 10 MG/ML IV SOLN
1000.0000 mg | Freq: Once | INTRAVENOUS | Status: AC
  Administered 2016-01-20: 1000 mg via INTRAVENOUS
  Filled 2016-01-20: qty 100

## 2016-01-20 MED ORDER — 0.9 % SODIUM CHLORIDE (POUR BTL) OPTIME
TOPICAL | Status: DC | PRN
Start: 2016-01-20 — End: 2016-01-20
  Administered 2016-01-20: 1000 mL

## 2016-01-20 MED ORDER — BUPIVACAINE HCL (PF) 0.5 % IJ SOLN
INTRAMUSCULAR | Status: AC
  Filled 2016-01-20: qty 30

## 2016-01-20 MED ORDER — PANTOPRAZOLE SODIUM 40 MG PO TBEC
40.0000 mg | DELAYED_RELEASE_TABLET | Freq: Every day | ORAL | Status: DC
  Administered 2016-01-21 – 2016-01-26 (×6): 40 mg via ORAL
  Filled 2016-01-20 (×6): qty 1

## 2016-01-20 MED ORDER — FENTANYL CITRATE (PF) 100 MCG/2ML IJ SOLN
INTRAMUSCULAR | Status: DC | PRN
  Administered 2016-01-20 (×7): 50 ug via INTRAVENOUS
  Administered 2016-01-20: 150 ug via INTRAVENOUS
  Administered 2016-01-20: 50 ug via INTRAVENOUS

## 2016-01-20 MED ORDER — PHENYLEPHRINE 40 MCG/ML (10ML) SYRINGE FOR IV PUSH (FOR BLOOD PRESSURE SUPPORT)
PREFILLED_SYRINGE | INTRAVENOUS | Status: AC
  Filled 2016-01-20: qty 10

## 2016-01-20 MED ORDER — THROMBIN 20000 UNITS EX SOLR
CUTANEOUS | Status: AC
  Filled 2016-01-20: qty 20000

## 2016-01-20 MED ORDER — GABAPENTIN 300 MG PO CAPS
300.0000 mg | ORAL_CAPSULE | Freq: Three times a day (TID) | ORAL | Status: DC
  Administered 2016-01-20 – 2016-01-26 (×17): 300 mg via ORAL
  Filled 2016-01-20 (×17): qty 1

## 2016-01-20 MED ORDER — ACETAMINOPHEN 650 MG RE SUPP: 650.0000 mg | RECTAL | Status: DC | PRN

## 2016-01-20 MED ORDER — LACTATED RINGERS IV SOLN
INTRAVENOUS | Status: DC | PRN
  Administered 2016-01-20: 08:00:00 via INTRAVENOUS

## 2016-01-20 SURGICAL SUPPLY — 94 items
ADH SKN CLS APL DERMABOND .7 (GAUZE/BANDAGES/DRESSINGS)
AGENT HMST KT MTR STRL THRMB (HEMOSTASIS) ×2
APL SKNCLS STERI-STRIP NONHPOA (GAUZE/BANDAGES/DRESSINGS) ×1
APL SRG 60D 8 XTD TIP BNDBL (TIP) ×2
BENZOIN TINCTURE PRP APPL 2/3 (GAUZE/BANDAGES/DRESSINGS) ×1 IMPLANT
BLADE SURG ROTATE 9660 (MISCELLANEOUS) IMPLANT
BONE VIVIGEN FORMABLE 5.4CC (Bone Implant) ×2 IMPLANT
BUR MATCHSTICK NEURO 3.0 LAGG (BURR) ×2 IMPLANT
BUR RND FLUTED 2.5 (BURR) IMPLANT
CAGE TLIF NANOLOCK STD 10 (Cage) ×1 IMPLANT
CAGE TLIF NANOLOCK STD 11 (Cage) ×1 IMPLANT
CANISTER SUCT 3000ML PPV (MISCELLANEOUS) ×1 IMPLANT
CLSR STERI-STRIP ANTIMIC 1/2X4 (GAUZE/BANDAGES/DRESSINGS) ×2 IMPLANT
CONNECTOR EXPEDIUM SFX SZ A5 (Orthopedic Implant) ×1 IMPLANT
COVER MAYO STAND STRL (DRAPES) ×4 IMPLANT
COVER SURGICAL LIGHT HANDLE (MISCELLANEOUS) ×2 IMPLANT
DECANTER SPIKE VIAL GLASS SM (MISCELLANEOUS) ×2 IMPLANT
DERMABOND ADVANCED (GAUZE/BANDAGES/DRESSINGS)
DERMABOND ADVANCED .7 DNX12 (GAUZE/BANDAGES/DRESSINGS) ×1 IMPLANT
DRAPE C-ARM 42X72 X-RAY (DRAPES) ×4 IMPLANT
DRAPE INCISE IOBAN 66X45 STRL (DRAPES) ×1 IMPLANT
DRAPE MICROSCOPE LEICA (MISCELLANEOUS) ×1 IMPLANT
DRAPE SURG 17X23 STRL (DRAPES) ×8 IMPLANT
DRAPE TABLE COVER HEAVY DUTY (DRAPES) ×2 IMPLANT
DRSG MEPILEX BORDER 4X4 (GAUZE/BANDAGES/DRESSINGS) IMPLANT
DRSG MEPILEX BORDER 4X8 (GAUZE/BANDAGES/DRESSINGS) IMPLANT
DRSG PAD ABDOMINAL 8X10 ST (GAUZE/BANDAGES/DRESSINGS) ×1 IMPLANT
DURAPREP 26ML APPLICATOR (WOUND CARE) ×2 IMPLANT
DURASEAL APPLICATOR TIP (TIP) ×2 IMPLANT
DURASEAL SPINE SEALANT 3ML (MISCELLANEOUS) ×2 IMPLANT
ELECT BLADE 4.0 EZ CLEAN MEGAD (MISCELLANEOUS) ×2
ELECT BLADE 6.5 EXT (BLADE) IMPLANT
ELECT CAUTERY BLADE 6.4 (BLADE) ×2 IMPLANT
ELECT REM PT RETURN 9FT ADLT (ELECTROSURGICAL) ×2
ELECTRODE BLDE 4.0 EZ CLN MEGD (MISCELLANEOUS) IMPLANT
ELECTRODE REM PT RTRN 9FT ADLT (ELECTROSURGICAL) ×1 IMPLANT
EVACUATOR 1/8 PVC DRAIN (DRAIN) IMPLANT
FEE INTRAOP MONITOR IMPULS NCS (MISCELLANEOUS) IMPLANT
GAUZE SPONGE 4X4 12PLY STRL (GAUZE/BANDAGES/DRESSINGS) ×1 IMPLANT
GLOVE BIOGEL PI IND STRL 8 (GLOVE) ×1 IMPLANT
GLOVE BIOGEL PI INDICATOR 8 (GLOVE) ×1
GLOVE ECLIPSE 9.0 STRL (GLOVE) ×2 IMPLANT
GLOVE ORTHO TXT STRL SZ7.5 (GLOVE) ×2 IMPLANT
GLOVE SURG 8.5 LATEX PF (GLOVE) ×2 IMPLANT
GOWN STRL REUS W/ TWL LRG LVL3 (GOWN DISPOSABLE) ×1 IMPLANT
GOWN STRL REUS W/TWL 2XL LVL3 (GOWN DISPOSABLE) ×4 IMPLANT
GOWN STRL REUS W/TWL LRG LVL3 (GOWN DISPOSABLE) ×2
GRAFT BNE MATRIX VG FRMBL MD 5 (Bone Implant) IMPLANT
HEMOSTAT SURGICEL 2X14 (HEMOSTASIS) ×1 IMPLANT
INTRAOP MONITOR FEE IMPULS NCS (MISCELLANEOUS) ×1
INTRAOP MONITOR FEE IMPULSE (MISCELLANEOUS) ×1
KIT BASIN OR (CUSTOM PROCEDURE TRAY) ×2 IMPLANT
KIT POSITION SURG JACKSON T1 (MISCELLANEOUS) ×2 IMPLANT
KIT ROOM TURNOVER OR (KITS) ×2 IMPLANT
MANIFOLD NEPTUNE WASTE (CANNULA) ×1 IMPLANT
NDL SPNL 18GX3.5 QUINCKE PK (NEEDLE) ×1 IMPLANT
NEEDLE 22X1 1/2 (OR ONLY) (NEEDLE) ×2 IMPLANT
NEEDLE RANFAC BLUNT 8X15 (NEEDLE) IMPLANT
NEEDLE SPNL 18GX3.5 QUINCKE PK (NEEDLE) ×6 IMPLANT
NS IRRIG 1000ML POUR BTL (IV SOLUTION) ×3 IMPLANT
PACK LAMINECTOMY ORTHO (CUSTOM PROCEDURE TRAY) ×2 IMPLANT
PAD ARMBOARD 7.5X6 YLW CONV (MISCELLANEOUS) ×4 IMPLANT
PATTIES SURGICAL .5 X.5 (GAUZE/BANDAGES/DRESSINGS) ×1 IMPLANT
PATTIES SURGICAL .75X.75 (GAUZE/BANDAGES/DRESSINGS) ×1 IMPLANT
PATTIES SURGICAL 1X1 (DISPOSABLE) ×2 IMPLANT
PROBE PEDCLE PROBE MAGSTM DISP (MISCELLANEOUS) ×1 IMPLANT
ROD EXPEDIUM PRE BENT 5.5X75 (Rod) ×2 IMPLANT
SCREW CORTICAL VIPER 7X35 (Screw) ×1 IMPLANT
SCREW CORTICAL VIPER 7X40MM (Screw) ×3 IMPLANT
SCREW SET SINGLE INNER (Screw) ×6 IMPLANT
SCREW VIPER CORT FIX 6.00X30 (Screw) ×1 IMPLANT
SCREW VIPER CORTICAL FIX 6X40 (Screw) ×1 IMPLANT
SPONGE LAP 18X18 X RAY DECT (DISPOSABLE) ×1 IMPLANT
SPONGE LAP 4X18 X RAY DECT (DISPOSABLE) ×7 IMPLANT
SPONGE SURGIFOAM ABS GEL 100C (HEMOSTASIS) ×1 IMPLANT
SURGIFLO W/THROMBIN 8M KIT (HEMOSTASIS) ×2 IMPLANT
SUT NURALON 4 0 TR CR/8 (SUTURE) ×1 IMPLANT
SUT VIC AB 0 CT1 27 (SUTURE) ×6
SUT VIC AB 0 CT1 27XBRD ANBCTR (SUTURE) ×1 IMPLANT
SUT VIC AB 1 CTX 36 (SUTURE) ×6
SUT VIC AB 1 CTX36XBRD ANBCTR (SUTURE) ×2 IMPLANT
SUT VIC AB 2-0 CT1 27 (SUTURE) ×2
SUT VIC AB 2-0 CT1 TAPERPNT 27 (SUTURE) ×1 IMPLANT
SUT VIC AB 3-0 X1 27 (SUTURE) ×2 IMPLANT
SUT VICRYL 0 CT 1 36IN (SUTURE) ×4 IMPLANT
SYR 20CC LL (SYRINGE) ×2 IMPLANT
SYR CONTROL 10ML LL (SYRINGE) ×4 IMPLANT
TAP CANN VIPER2 DL 7.0 (TAP) ×1 IMPLANT
TAPE CLOTH SURG 6X10 WHT LF (GAUZE/BANDAGES/DRESSINGS) ×1 IMPLANT
TOWEL OR 17X24 6PK STRL BLUE (TOWEL DISPOSABLE) ×2 IMPLANT
TOWEL OR 17X26 10 PK STRL BLUE (TOWEL DISPOSABLE) ×2 IMPLANT
TRAY FOLEY CATH 16FRSI W/METER (SET/KITS/TRAYS/PACK) ×2 IMPLANT
WATER STERILE IRR 1000ML POUR (IV SOLUTION) ×1 IMPLANT
YANKAUER SUCT BULB TIP NO VENT (SUCTIONS) ×2 IMPLANT

## 2016-01-20 NOTE — Anesthesia Procedure Notes (Signed)
Procedure Name: Intubation Date/Time: 01/20/2016 7:47 AM Performed by: Jenne Campus Pre-anesthesia Checklist: Patient identified, Emergency Drugs available, Suction available and Patient being monitored Patient Re-evaluated:Patient Re-evaluated prior to inductionOxygen Delivery Method: Circle System Utilized Preoxygenation: Pre-oxygenation with 100% oxygen Intubation Type: IV induction Ventilation: Mask ventilation without difficulty Grade View: Grade I Tube type: Oral Tube size: 7.5 mm Number of attempts: 1 Airway Equipment and Method: Stylet and Oral airway Placement Confirmation: ETT inserted through vocal cords under direct vision,  positive ETCO2 and breath sounds checked- equal and bilateral Secured at: 22 cm Tube secured with: Tape Dental Injury: Teeth and Oropharynx as per pre-operative assessment

## 2016-01-20 NOTE — Anesthesia Preprocedure Evaluation (Addendum)
Anesthesia Evaluation  Patient identified by MRN, date of birth, ID band Patient awake    Reviewed: Allergy & Precautions, NPO status , Patient's Chart, lab work & pertinent test results, reviewed documented beta blocker date and time   Airway Mallampati: II  TM Distance: >3 FB Neck ROM: Full    Dental  (+) Upper Dentures, Lower Dentures, Dental Advisory Given   Pulmonary sleep apnea ,    Pulmonary exam normal        Cardiovascular hypertension, Pt. on medications and Pt. on home beta blockers Normal cardiovascular exam Rhythm:Regular Rate:Normal     Neuro/Psych    GI/Hepatic GERD  Medicated and Controlled,  Endo/Other  Morbid obesity  Renal/GU      Musculoskeletal   Abdominal   Peds  Hematology   Anesthesia Other Findings   Reproductive/Obstetrics                            Anesthesia Physical Anesthesia Plan  ASA: III  Anesthesia Plan: General   Post-op Pain Management:    Induction: Intravenous  Airway Management Planned: Oral ETT  Additional Equipment:   Intra-op Plan:   Post-operative Plan: Extubation in OR  Informed Consent: I have reviewed the patients History and Physical, chart, labs and discussed the procedure including the risks, benefits and alternatives for the proposed anesthesia with the patient or authorized representative who has indicated his/her understanding and acceptance.     Plan Discussed with: CRNA and Surgeon  Anesthesia Plan Comments:         Anesthesia Quick Evaluation

## 2016-01-20 NOTE — Discharge Instructions (Signed)
° ° °  Call if there is increasing drainage, fever greater than 101.5, severe head aches, and worsening nausea or light sensitivity. If shortness of breath, bloody cough or chest tightness or pain go to an emergency room. No lifting greater than 10 lbs. Avoid bending, stooping and twisting. Use brace when sitting and out of bed even to go to bathroom. Walk in house for first 2 weeks then may start to get out slowly increasing distances up to one quarter mile by 4-6 weeks post op. After 5 days may shower and change dressing following bathing with shower.When bathing remove the brace shower and replace brace before getting out of the shower. If drainage, keep dry dressing and do not bathe the incision, use an moisture impervious dressing. Please call and return for scheduled follow up appointment 2 weeks from the time of surgery.

## 2016-01-20 NOTE — Interval H&P Note (Signed)
History and Physical Interval Note:  01/20/2016 7:30 AM  Kristen [name]  has presented today for surgery, with the diagnosis of L3-4 and L4-5 spinal stenosis and degenerative disc disease  The various methods of treatment have been discussed with the patient and family. After consideration of risks, benefits and other options for treatment, the patient has consented to  Procedure(s): Removal of rod at right L2 to L3, Transforaminal lumbar interbody fusion right L3-4 and right L4-5 with pedicle screws and rods L2, L3, L4 and L5, local bone graft, allograft and Vivigen (N/A) as a surgical intervention .  The patient's history has been reviewed, patient examined, no change in status, stable for surgery.  I have reviewed the patient's chart and labs.  Questions were answered to the patient's satisfaction.     Jessy Oto

## 2016-01-20 NOTE — Op Note (Signed)
01/20/2016  5:18 PM  PATIENT:  Kristen [name]  51 y.o. female  MRN: [id number]  OPERATIVE REPORT  PRE-OPERATIVE DIAGNOSIS:  L3-4 and L4-5 spinal stenosis and degenerative disc disease  POST-OPERATIVE DIAGNOSIS:  L3-4 and L4-5 spinal stenosis and degenerative disc disease  PROCEDURE:  Procedure(s): Removal of rod at right L2 to L3, Transforaminal lumbar interbody fusion right L3-4 and right L4-5 with pedicle screws and rods L2, L3, L4 and L5, local bone graft, allograft and Vivigen    SURGEON:  Jessy Oto, MD     ASSISTANT:  Benjiman Core, PA-C  (Present throughout the entire procedure and necessary for completion of procedure in a timely manner)     ANESTHESIA:  General,supplemented with local marcaine 0.5% 1:1 exparel 1.3% total 30cc, Dr. Conrad .    COMPLICATIONS:  Dural tear 45m bleb L4-5, repaired with 4 x 4-0 Neurolon and duraseal., blood loss, cell saver returned.    COMPONENTS:  Implant Name Type Inv. Item Serial No. Manufacturer Lot No. LRB No. Used  BONE VIVIGEN FORMABLE 5.4CC - [id number] Implant BONE VIVIGEN FORMABLE 5.4CC [id number] [facility]  N/A 1  30x11x125m4 deg lordotic interbody spacer Spacer   [employer] [id number] 1  30x11x1035m deg lordotic interbody  spacer Spacer   T[employer] SPINE [id number] 1  SCREW CORTICAL VIPER 7X40MM - [id number] SCREW CORTICAL VIPER 7X40MM  [employer]  N/A 3  SCREW CORTICAL VIPER 7X35 - [id number] SCREW CORTICAL VIPER 7X35  [employer] SPINE  N/A 1  SCREW VIPER CORT FIX 6.00X30 - [id number] SCREW VIPER CORT FIX 6.00X30  [employer] SPINE  N/A 1  ROD EXPEDIUM PRE BENT 5.5X75 - [id number] ROD EXPEDIUM PRE BENT 5.5X75  [employer] SPINE  N/A 2  SCREW SET SINGLE INNER - [id number] SCREW SET SINGLE INNER  [employer] SPINE  N/A 6  SCREW VIPER CORTICAL FIX 6X40 - [id number] SCREW VIPER CORTICAL FIX 6X40  [employer] SPINE  N/A  1  CONNECTOR EXPEDIUM SFX SZ A5 - [id number] Implant CONNECTOR EXPEDIUM SFX SZ A5   [employer] SPINE   N/A 1     PROCEDURE:    The patient was met in the holding area, and the appropriate Right L3-4 and L4-5 lumbar levels identified and marked with an "X" and my initials. I had discussion with the patient in the preop holding area regarding consent form. Patient understands the rationale for the fusion site as the L3-4 and L4-5 segment For stenosis and degenerative spondylolisthesis.  The patient was then transported to OR and was placed under general anestheticwithout difficulty. The patient received appropriate preoperative antibiotic prophylaxis ancef 2 grams IV.  Nursing staff inserted a Foley catheter under sterile conditions. The patient was then turned to a prone position using the [place] frame. PAS. all pressure points well padded the arms at the side to 90 90. Standard prep with DuraPrep solution draped in the usual manner from the lower dorsal spine the mid sacral segment. Iodine Vi-Drape was used and the incision was marked. Time-out procedure was called and correct. Skin in the midline between L2 and L5 was then infiltrated with local marcaine 0.5% 1:1 exparel 1.3% total 30c.  Incision was then made through the skin and subcutaneous layers ellipsing the old scar down to the patient's lumbodorsal fascia and spinous processes. The incision then carried sharply excising the supraspinous ligament and then  continuing the lateral aspect of the spinous processes L3 L4-L5. Cobb elevators used to carefully elevate the paralumbar muscles off of the posterior elements using electrocautery carefully drilled bleeding and perform dissection of the muscle tissues of the preserving the lateral facets at L2-3, L3-4 and L4-5. The old Harrington rods caudally on the right side at the L2-3 level was identified. Osteotomes used to resect bone or come [place] about the lower chance of  the rod on the right side at L2 and L3. Bone was also freed the area of the hook over the right superior aspect of the lamina of L3. Viper retractor was used for the upper part of the incision. Bolt cutter was then used to divide the Harrington rod at the L2 level and the rod hook was then able to be rotated from the superior lamina of L3.C-arm fluoroscopy then a hole made into the lateral aspect of the pedicle of right L3 observed in the pedicle using ball-tipped nerve hook and hockey stick nerve probe initial entry was determined on fluoroscopy to be good position alignment so that a ball handled probe was then used to probe the left L3 pedicle to a depth of nearly 40 mm observed on C-arm fluoroscopy to be beyond the midpoint of the lumbar vertebra and then position alignment within the left L3 pedicle this was then removed and the pedicle channel probed demonstrating patency no sign of rupture the cortex of the pedicle. Tapping with a 5 mm screw then 6.0 mm tap then 7.0 mm x 40 mm screw was placed on the right side at the L3 level. C-arm fluoroscopy was then brought into the field and using C-arm fluoroscopy then a hole made into the lateral aspect of the pedicle of left L3 observed in the pedicle using ball tipped nerve hook and hockey stick nerve probe initial entry was determined on fluoroscopy to be good position alignment so that a ball handled probe was then used to probe the left L3 pedicle to a depth of nearly 40 mm observed on C-arm fluoroscopy to be beyond the midpoint of the lumbar vertebra and then position alignment within the left L3 pedicle this was then removed and the pedicle channel probed demonstrating patency no sign of rupture the cortex of the pedicle. Tapping with a 5.0 mm then a 6 mm screw tap then 7.0 mm x 40 mm screw was placed on the left side at the L3 level C-arm fluoroscopy was then brought into the field and using C-arm fluoroscopy then a hole made into the medial aspect of the  pedicle of left L4 observed in the pedicle using ball tipped nerve hook and hockey stick nerve probe initial entry was determined on fluoroscopy to be good position alignment so that a ball handled probe was then used to probe the left L4 pedicle to a depth of nearly 30 mm observed on C-arm fluoroscopy to be well aligned within the left L4 pedicle, the pedicle channel probed demonstrating patency no sign of rupture the cortex of the pedicle. Tapping with a 5 mm screw tap then 6.0 mm x 30 mm screw was placed on the left side at the L4 level. C-arm fluoroscopy was used to localize the hole made in the lateral aspect of the pedicle of L4 on the right localizing the pedicle within the spinal canal with nerve hook and ball tipped probe carefully passed down the center of the L4 pedicle to a depth of nearly 40 mm. Observed on C-arm  fluoroscopy to be in good position alignment channel was probed with a ball-tipped probe ensure patency no sign of cortical disruption. Following tapping with a 5 mm tap and a 6.0 mm tap, a 6.0 x 40 mm screw was placed on the table for the right side pedicle at L4. C-arm fluoroscopy was used to localize the hole made in the medial aspect of the pedicle of L5 on the right localizing the pedicle and ball tipped probe carefully passed down the center of the L5 pedicle to a depth of nearly 40 mm. Observed on C-arm fluoroscopy to be in good position alignment channel was probed with a ball-tipped probe ensure patency no sign of cortical disruption. Following tapping with a 5 mm tap and a 6.0 mm tap and then a 7.52m tap,  a 7.0 x 40 mm screw was placed on the table for the right side pedicle at L5. C-arm fluoroscopy was used to localize the hole made in the mediall aspect of the pedicle of L5 on the left localizing the pedicle and passing a ball tipped probe carefully passed down the center of the L5 pedicle to a depth of nearly 35 mm. Observed on C-arm fluoroscopy to be in good position alignment  channel was probed with a ball-tipped probe ensure patency no sign of cortical disruption. Following tapping with a 6 mm tap and a 7.0 mm tap,  a 7.0 x 35 mm screw was placed on the left side pedicle at L5. Attention then turned to placement of the transforaminal lumbar interbody fusion cages.   Spinous processes of L3 L4 were then resected down to the base the lamina at each segment the upper 50% of the spinous process of L5 was resected and Leksell rongeur used to resect inferior aspect of the lamina on the right side at the L3 level and partially on the right side at L4. The right inferior articular process of L3 and L4 were resected in order to provide for exposure of the right side L3-4 and L4-5 neuroforamen for ease of placement of TLIFs (transforaminal lumbar interbody fusion) essential portions of the lamina were also resected first beginning with the Leksell rongeur and then resecting using 2 and 3 mm Kerrison.OR microscope sterilely draped and brought into  The field. Laminectomy was carried out resecting the central portions of the lamina of L3 and L4 performing foraminotomies on the right side at the L5 level. A dural tear occurred centrally at the L4-5 level ventral to the superior lamina of L5 along the right side of the spinous process L5. The dural tear about 361m the arachnoid layer intact and all neural elements intact. Repaired after protecting with a tear with a cottonoid, and then exposing the bleb by performing central laminectomy of about 4 mm of the superior central lamina of L5. The dural repaired under microscope using interrupted 4-0 Neurolon sutures x 4. Valsalva to 3074mg demonstrated no CSF leak. The inferior articular process L3 and L4 were resected on the right side. The L5 nerve root identified bilaterally and the medial aspect of the L5 pedicle. Superior articular process of L5 was then resected from the right side further decompressing the right L4 nerve and providing for  exposure of the area just superior to the L5 pedicle for a placement of cage. Returning to the left side decompression was carried out along the right side recut resecting the superior articular process of L3 overlying the L3 nerve root as it exited at the L3-4 level  decompressing the lateral recess along the medial aspect of the pedicle of L3 and resecting the superior to the process of L4 and decompressing the right L3 neuroforamen. Loupe magnification and headlight were used during this portion procedure. At the L5 level similarly lateral recess and the neuroforamen were resected decompressing the L4 ad L5 nerve roots and foraminotomies was widely performed over the left L3 nerve and L4 nerve roots.C-arm fluoroscopy was then brought into the field and using Using a Penfield 4 the lateral aspect of the thecal sac and the inferior aspect of the L4 nerve root on the right side at the L4-5 level was carefully drilled. The thecal sac could then easily be retracted in the posterior lateral aspect of the L4-5 disc was exposed 15 blade scalpel used to incise the osteotome used to resect a small portion of bone off the superior aspect of the posterior superior vertebral body of L5 in order to ease the entry into the L4-5 disc space. A pituitary rongeur was then able to be introduced in the disc space debrided it of degenerative disc material. 7 mm dilator was used to dialatethe L4-5 disc space on the right side attempts were made to dilate further increments to 21m successfully and using small curettes and the disc space was debrided a moderate amount of degenerative disc present in the endplates debrided to bleeding endplate bone. A 11 mm lordotic Titanium endoskeleton TT implant cage  packed with morcellized bone graft and the been harvested from previous laminotomies and also packed with Vivigen II additional bone graft was then packed into the intervertebral disc space. With this then a 11 mm lorditic cage was  introdudced into the disc space on the right side in the correct degree convergence and then impacted then subset beneath the posterior aspect of the disc space by about 3 or 4 mm. Bleeding controlled using bipolar electrocautery thrombin soaked gel cottonoids. Then turned to the right L3-4 level similarly the exposure the posterior lateral aspect this was carried out using a Penfield 4 bipolar electrocautery to control small bleeders present. Derricho retractor used to retract the thecal sac and nerve root, a 15 blade scalpel was used to incise posterior lateral aspect of the disc at the L3-4 disc space.The space was debrided of degenerative disc material using pituitary along root the entire disc space was then debrided of degenerative disc material using pituitary rongeurs curettage down to bleeding bone endplates. Residual disc was resected using pituitary. This space was then carefully sounded to a 10 mm cage trial provided the best fit the lordotic cage was chosen the 11 mmTT Endoskeleton TT Implant. The intervertebral disc space was then packed with autogenous local bone graft that been harvested from the central laminectomy in addition to Vivigen II the disc space was pack the graft.  This provided excellent bone graft within the intervertebral disc space at L3-4 so that the permanent 10 mm lordotic Endoskeleton TT Implant cage was then packed with local bone graft and Vivigen II placed into the intervertebral disc space and impacted into place in the correct degree of convergence. Bleeding controlled using bipolar electrocautery. The cage was subset beneath the posterior aspect of the about 3-4 mm . Bleeding was hemostasis attention was performed on the right side portion of the central laminectomy at the L3-4 and  L4-5 levels carefully decompressing thecal sac and the appropriate nerve root L3, L4 and L5 at the L3-4 and L4-5. Observed on C-arm fluoroscopy to be in good position  alignment. With this then  the transforaminal lumbar interbody fusion portion of the case was completed bleeders were controlled using bipolar electrocautery thrombin-soaked Gelfoam were appropriate. 3 screws on the right side then each placed at the L3, L4 and L5 levels.   3 screws on the right and then each carefully aligned and tightened or loose and a slight amount to allow for placement of rods. The precontoured 6m rod was then placed into the pedicle screws on the right extending from L3-5 each of the caps carefully placed loosely tightened. Attention turned to the left side were similarly and then screws were carefully adjusted to allow for a better pattern screws to allow for placement of fixation rod template for the rod was then taken and a precontoured quarter inch 785mtitanium rod was placed. The right L3 rod fastener cap was then tightened 85 pounds similarly on at the right side disc space at L3-4 and L4-5 screw fasteners compression was obtained on the right side between L3 and L4. And at L4 and L5 by compressing between the facets right hand the screw caps tightened to 85 pounds. Similarly this was done on the left side at L3 and L4 screws were slightly compressed and tightened 85 pounds. Returning then to the L4-5 level compression was obtained The screw L5 fasteners caps and the L4 fasteners caps were tightened to 80 foot-pounds tIirrigation was carried out with copious amounts of saline solution this was done throughout the case. Intraoperative C-arm views demonstrated the left rod to be extending caudally in excess of 2 cm, a bolt cutter then used to divide the rod on the left just below or caudal tot the left L5 pedicle fastener. At this time significant venous bleeding was present and responded to use of flowseal, thrombin soaked gel foam and surgicil, There was large venous plexus ventral and lateral to the left L3-4 disc and the left L4 nerve root. When bleeding was controlled duraseal was able to be applied to the  right L4-5 level further sealing the dural repair area.  Irrigation was carried out and the left central and left L3-4, and L4-5 facets were decorticated and bone graft applied to the left posterior interlaminar area and into the decorticated facets at L3-4 and L4-5. Cell Saver was used during the case. A single cross-link for the Depuy system was placed at the L4-5 level measured with the measuring tool and the appropriate A5 cross-link was then carefully applied to the rods and tightened again to 80 foot-pounds bilaterally using the appropriate torque screwdriver. Center screw on the cross-link was then carefully tightened 80 foot-pounds irrigation was carried out observation showed no dural tear demonstrated no leakage present. Permanent C-arm images were obtained in AP and lateral plane. Intra operative neuromonitoring was performed and the intraopertive resistance measured  Left L3 19, Right L3 27, Left L4 20 and Right L4 26, Left L5 21, Right L5 15. Intraoperative motor studies were normal. Remaining local bone graft was then applied along the left and right lateral posterior lateral region extending from L3 through L5 transverse processes. Excess Gelfoam was then removed the lumbodorsal musculature carefully exam debrided of any devitalized tissue following removal of Vicryl retractors were the bleeders were controlled using electrocautery and the area dorsal lumbar muscle were then approximated in the midline with interrupted #1 Vicryl sutures loose the dorsal fascia was re\re attached to the spinous process of L2 to superiorly and asked to history inferiorly this was done with #1 Vicryl sutures.  Subcutaneous layers then approximated with interrupted 0 Vicryl sutures and 2-0 Vicryl sutures. Skin was closed with a running subcutaneous stitch of 4-0 Vicryl Dermabond was applied then MedPlex bandage. All instrument and sponge counts were correct. The patient was then returned to a supine position on her bed  reactivated extubated and returned to the recovery room in satisfactory condition.    Benjiman Core, PA-C perform the duties of assistant surgeon during this case. He was present from the beginning of the case to the end of the case assisting in transfer the patient from his stretcher to the OR table and back to the stretcher at the end of the case. Assisted in careful retraction and suction of the laminectomy site delicate neural structures operating under the operating room microscope. He performed closure of the incision from the fascia to the skin applying the dressing.     Jessy Oto  01/20/2016, 5:18 PM

## 2016-01-20 NOTE — H&P (Signed)
Kristen [name] is an 51 y.o. female.   Chief Complaint: back pain and right lower extremity radiculopathy  HPI: patient with hx or lumbar stenosis and above complaint presented to our office.  Progressively worsening symptoms.  Failed conservative treatment.    Past Medical History:  Diagnosis Date  . Allergy   . Anemia    Iron deficiency  . Anxiety   . Arthritis    back, left shoulder  . Chicken pox   . Depression   . Diarrhea    takes Imodium daily  . GERD (gastroesophageal reflux disease)   . H/O hiatal hernia   . Headache(784.0)   . History of kidney stones    1996ish  . Hypertension   . Migraine    none for 5 years (as of 01/13/16)  . OSA (obstructive sleep apnea) 10/13/2015   unable to get cpap, plans to get one in 2018  . Pneumonia   . Restless legs   . Scoliosis   . Shortness of breath    with exertion    Past Surgical History:  Procedure Laterality Date  . BACK SURGERY  07/21/1978  . CHOLECYSTECTOMY N/A 08/15/2013   Procedure: LAPAROSCOPIC CHOLECYSTECTOMY WITH INTRAOPERATIVE CHOLANGIOGRAM;  Surgeon: Gwenyth Ober, MD;  Location: [facility];  Service: General;  Laterality: N/A;  . COLONOSCOPY N/A 01/09/2013   Procedure: COLONOSCOPY;  Surgeon: Beryle Beams, MD;  Location: [facility];  Service: Endoscopy;  Laterality: N/A;  . HERNIA REPAIR Left 1981  . TONSILLECTOMY    . TUBAL LIGATION  06/1988    Family History  Problem Relation Age of Onset  . Heart disease Father   . Heart attack Father   . Hypertension Mother   . Arthritis Mother   . Diabetes Maternal Grandmother   . Diabetes Paternal Grandmother   . Diabetes Maternal Uncle    Social History:  reports that she has never smoked. She has never used smokeless tobacco. She reports that she drinks about 4.2 oz of alcohol per week . She reports that she does not use drugs.  Allergies:  Allergies  Allergen Reactions  . Other Hives and Swelling    tomato  . Sulfur Hives and Swelling    No prescriptions  prior to admission.    No results found for this or any previous visit (from the past 48 hour(s)). No results found.  Review of Systems  Constitutional: Negative.   HENT: Negative.   Eyes: Negative.   Respiratory: Negative.   Cardiovascular: Negative.   Gastrointestinal: Negative.   Genitourinary: Negative.   Musculoskeletal: Positive for back pain.  Skin: Negative.   Neurological: Positive for tingling.  Psychiatric/Behavioral: Negative.     There were no vitals taken for this visit. Physical Exam  Constitutional: She is oriented to person, place, and time. No distress.  HENT:  Head: Normocephalic and atraumatic.  Eyes: EOM are normal. Pupils are equal, round, and reactive to light.  Neck: Normal range of motion.  Respiratory: Effort normal.  GI: She exhibits no distension.  Musculoskeletal: She exhibits tenderness.  Neurological: She is alert and oriented to person, place, and time.  Skin: Skin is warm and dry.  Psychiatric: She has a normal mood and affect.     Study Result   CLINICAL DATA:  51 y/o F; history of scoliosis with Harrington rods presenting with new lumbar pain radiating to the right leg with numbness. History of multiple injections.  EXAM: MRI LUMBAR SPINE WITHOUT CONTRAST  TECHNIQUE: Multiplanar, multisequence  MR imaging of the lumbar spine was performed. No intravenous contrast was administered.  COMPARISON:  10/08/2015 lumbar radiographs.  04/12/2014 lumbar MRI.  FINDINGS: Segmentation:  Standard.  Alignment: Moderate to severe lumbar rotatory levocurvature with apex at L1.  Vertebrae: No fracture, evidence of discitis, or bone lesion. Severe endplate degenerative changes at the L3-4 level with vacuum phenomenon and extensive opposing endplate deformity. Right-sided Harrington rod at the L3-4 level.  Conus medullaris: Extends to the L1 level and appears normal.  Paraspinal and other soft tissues: Negative.  Disc  levels:  L1-2: No significant disc displacement, foraminal narrowing, or canal stenosis.  L2-3: No significant disc displacement, foraminal narrowing, or canal stenosis.  L3-4: Disc bulge and large marginal osteophyte and extensive facet hypertrophy. The level is largely obscured by streak artifact from the Harrington rod. There is at least moderate bilateral foraminal narrowing and canal stenosis.  L4-5: Small disc bulge and left-greater-than-right moderate to severe facet hypertrophy. Moderate left and mild right foraminal narrowing. Combined with epidural lipomatosis there is mild-to-moderate canal stenosis with effacement of CSF.  L5-S1: Small disc bulge and left-greater-than-right mild facet hypertrophy. Mild left foraminal narrowing.  IMPRESSION: 1. Moderate-to-severe lumbar rotatory levocurvature with apex at L1 and right-sided Harrington rod with tip at L3-4. 2. Severe discogenic and facet degenerative changes at L3-4 partially obscured by susceptibility artifact from hardware. At least moderate bilateral foraminal narrowing and canal stenosis. 3. Degenerative changes at L4-5 with moderate left and mild right foraminal narrowing and mild-to-moderate canal stenosis.   Electronically Signed   By: Kristine Garbe M.D.   On: 11/19/2015 23:00    Assessment/Plan Lumbar stenosis, low back pain and right lower extremity radiculopathy  Will proceed with Removal of rod at right L2 to L3, Transforaminal lumbar interbody fusion right L3-4 and right L4-5 with pedicle screws and rods L2, L3, L4 and L5, local bone graft, allograft and Vivigen.  Surgical procedure along with possible risks and complications discussed in detail. All questions answered and wishes to proceed.   Benjiman Core, PA-C 01/20/2016, 2:31 AM

## 2016-01-20 NOTE — Transfer of Care (Signed)
Immediate Anesthesia Transfer of Care Note  Patient: Kristen [name]  Procedure(s) Performed: Procedure(s): Removal of rod at right L2 to L3, Transforaminal lumbar interbody fusion right L3-4 and right L4-5 with pedicle screws and rods L2, L3, L4 and L5, local bone graft, allograft and Vivigen (N/A)  Patient Location: PACU  Anesthesia Type:General  Level of Consciousness: awake and patient cooperative  Airway & Oxygen Therapy: Patient Spontanous Breathing and Patient connected to face mask oxygen  Post-op Assessment: Report given to RN and Post -op Vital signs reviewed and stable  Post vital signs: Reviewed and stable  Last Vitals:  Vitals:   01/20/16 0559  BP: (!) 131/91  Pulse: 81  Resp: 20  Temp: 37 C    Last Pain:  Vitals:   01/20/16 0559  TempSrc: Oral      Patients Stated Pain Goal: 3 (87/06/58 2608)  Complications: No apparent anesthesia complications

## 2016-01-20 NOTE — Brief Op Note (Signed)
01/20/2016  3:57 PM  PATIENT:  Kristen [name]  51 y.o. female  PRE-OPERATIVE DIAGNOSIS:  L3-4 and L4-5 spinal stenosis and degenerative disc disease  POST-OPERATIVE DIAGNOSIS:  L3-4 and L4-5 spinal stenosis and degenerative disc disease, Dural tear midline L4-5, 3 mm arachnoid layer intact no neural involvement.  PROCEDURE:  Procedure(s): Removal of rod at right L2 to L3, Transforaminal lumbar interbody fusion right L3-4 and right L4-5 with pedicle screws and rods L2, L3, L4 and L5, local bone graft, allograft and Vivigen (N/A)  SURGEON:  Surgeon(s) and Role:    * Jessy Oto, MD - Primary  PHYSICIAN ASSISTANT: Esaw Grandchild  ANESTHESIA:   local and general, Dr. Conrad Aguila.  EBL:  Total I/O In: 5800 [I.V.:4000; Blood:1300; IV Piggyback:500] Out: 3350 [Urine:750; Blood:2600]  BLOOD ADMINISTERED:550 CC PRBC  DRAINS: Urinary Catheter (Foley)   LOCAL MEDICATIONS USED:  MARCAINE0.5% 1:1 EXPAREL 1.3%    and Amount: 30 ml  SPECIMEN:  Source of Specimen:  Removed hardware lower hook Harrington Rod right L2-3  DISPOSITION OF SPECIMEN:  PATHOLOGY   COMPLICATION: Dural tear right midline posterior L4-5  COUNTS:  YES  DICTATION: .Dragon Dictation  PLAN OF CARE: Admit to inpatient   PATIENT DISPOSITION:  PACU - hemodynamically stable.   Delay start of Pharmacological VTE agent (>24hrs) due to surgical blood loss or risk of bleeding: yes

## 2016-01-21 DIAGNOSIS — I10 Essential (primary) hypertension: Secondary | ICD-10-CM | POA: Diagnosis not present

## 2016-01-21 DIAGNOSIS — M5136 Other intervertebral disc degeneration, lumbar region: Secondary | ICD-10-CM

## 2016-01-21 DIAGNOSIS — I951 Orthostatic hypotension: Secondary | ICD-10-CM

## 2016-01-21 DIAGNOSIS — R079 Chest pain, unspecified: Secondary | ICD-10-CM

## 2016-01-21 DIAGNOSIS — M48062 Spinal stenosis, lumbar region with neurogenic claudication: Principal | ICD-10-CM

## 2016-01-21 LAB — CBC
HEMATOCRIT: 31.6 % — AB (ref 36.0–46.0)
HEMOGLOBIN: 10.2 g/dL — AB (ref 12.0–15.0)
MCH: 27.3 pg (ref 26.0–34.0)
MCHC: 32.3 g/dL (ref 30.0–36.0)
MCV: 84.7 fL (ref 78.0–100.0)
Platelets: 120 10*3/uL — ABNORMAL LOW (ref 150–400)
RBC: 3.73 MIL/uL — ABNORMAL LOW (ref 3.87–5.11)
RDW: 14.3 % (ref 11.5–15.5)
WBC: 9.6 10*3/uL (ref 4.0–10.5)

## 2016-01-21 LAB — POCT I-STAT 4, (NA,K, GLUC, HGB,HCT)
GLUCOSE: 126 mg/dL — AB (ref 65–99)
GLUCOSE: 138 mg/dL — AB (ref 65–99)
GLUCOSE: 137 mg/dL — AB (ref 65–99)
Glucose, Bld: 133 mg/dL — ABNORMAL HIGH (ref 65–99)
HCT: 33 % — ABNORMAL LOW (ref 36.0–46.0)
HCT: 32 % — ABNORMAL LOW (ref 36.0–46.0)
HEMATOCRIT: 26 % — AB (ref 36.0–46.0)
HEMATOCRIT: 33 % — AB (ref 36.0–46.0)
HEMOGLOBIN: 8.8 g/dL — AB (ref 12.0–15.0)
Hemoglobin: 11.2 g/dL — ABNORMAL LOW (ref 12.0–15.0)
Hemoglobin: 10.9 g/dL — ABNORMAL LOW (ref 12.0–15.0)
Hemoglobin: 11.2 g/dL — ABNORMAL LOW (ref 12.0–15.0)
POTASSIUM: 7.3 mmol/L — AB (ref 3.5–5.1)
POTASSIUM: 3.9 mmol/L (ref 3.5–5.1)
Potassium: 5 mmol/L (ref 3.5–5.1)
Potassium: 4 mmol/L (ref 3.5–5.1)
SODIUM: 138 mmol/L (ref 135–145)
SODIUM: 142 mmol/L (ref 135–145)
Sodium: 142 mmol/L (ref 135–145)
Sodium: 142 mmol/L (ref 135–145)

## 2016-01-21 LAB — TROPONIN I: TROPONIN I: 0.03 ng/mL — AB (ref <0.03)

## 2016-01-21 LAB — BASIC METABOLIC PANEL
ANION GAP: 7 (ref 5–15)
BUN: 16 mg/dL (ref 6–20)
CHLORIDE: 111 mmol/L (ref 101–111)
CO2: 24 mmol/L (ref 22–32)
Calcium: 7.7 mg/dL — ABNORMAL LOW (ref 8.9–10.3)
Creatinine, Ser: 1.06 mg/dL — ABNORMAL HIGH (ref 0.44–1.00)
GFR calc non Af Amer: >60 mL/min (ref >60)
GLUCOSE: 126 mg/dL — AB (ref 65–99)
Potassium: 4.1 mmol/L (ref 3.5–5.1)
Sodium: 142 mmol/L (ref 135–145)

## 2016-01-21 LAB — CK TOTAL AND CKMB (NOT AT ARMC)
CK, MB: 10.9 ng/mL — ABNORMAL HIGH (ref 0.5–5.0)
Relative Index: 0.7 (ref 0.0–2.5)
Total CK: 1461 U/L — ABNORMAL HIGH (ref 38–234)

## 2016-01-21 LAB — MRSA PCR SCREENING: MRSA BY PCR: NEGATIVE

## 2016-01-21 MED ORDER — ALUM & MAG HYDROXIDE-SIMETH 200-200-20 MG/5ML PO SUSP
30.0000 mL | ORAL | Status: DC | PRN
  Administered 2016-01-21: 30 mL via ORAL
  Filled 2016-01-21: qty 30

## 2016-01-21 MED ORDER — SODIUM CHLORIDE 0.9 % IV BOLUS (SEPSIS)
1000.0000 mL | Freq: Once | INTRAVENOUS | Status: AC
  Administered 2016-01-21: 1000 mL via INTRAVENOUS

## 2016-01-21 NOTE — Progress Notes (Signed)
Paged dr Louanne Skye regarding low bp after bp meds was given. Pt c/o some indigestion. NP gave order for 12 lead EKG. Will call back with results.

## 2016-01-21 NOTE — Progress Notes (Signed)
Patient ID: Kristen [name], female   DOB: [birth year], 51 y.o.   MRN: [id number] Patient with persistent chest pain. Had discomfort on rounds this AM. EKG with possible lateral ischemia. Hgb 11 due to perioperative blood loss. Given antihypertensive agent this AM and now with persisting low  Systolic BP. Given 1 liter fluid bolus but no significant response to the  Bolus. Dr. Claiborne Billings and Cardiology on call contacted and will see.  May need pressor Agents to assist with BP. Troponin level 0.03 is negative but CK-MB is elevated.

## 2016-01-21 NOTE — Care Management Note (Addendum)
Case Management Note  Patient Details  Name: Kristen [name] MRN: [id number] Date of Birth: April 30, 1965  Subjective/Objective:  S/p TLIF, has dural tear, w/out csf leak, plan is to advance diet, pt/ot.  Await pt/ot eval.  She lives with her boyfriend, she has a pcp Dr. Elna Breslow and she has transportation at dc, no problems getting her meds.  Per OT eval , no ot follow up needed.  Orders in for 3 n 1 and rolling walker , NCM gave patient the [employer], she chose [facility], referral made to [facility] for DME, and referral made to [facility] for [place].  Soc will begin 24-48 hrs post dc.  NCM will cont to follow for dc needs.                  Action/Plan:   Expected Discharge Date:                  Expected Discharge Plan:  [place]  In-House Referral:     Discharge planning Services  CM Consult  Post Acute Care Choice:    Choice offered to:     DME Arranged:    DME Agency:     HH Arranged:    [place] Agency:     Status of Service:  In process, will continue to follow  If discussed at Long Length of Stay Meetings, dates discussed:    Additional Comments:  Zenon Mayo, RN 01/21/2016, 9:24 AM

## 2016-01-21 NOTE — Progress Notes (Signed)
Orthopedic Tech Progress Note Patient Details:  Kristen [name] 1965-08-03 [id number]  Patient ID: Kristen [name], female   DOB: 23-Oct-1965, 51 y.o.   MRN: [id number]   [name] 01/21/2016, 9:09 AM Called in bio-tech brace order; spoke with [facility]

## 2016-01-21 NOTE — Anesthesia Postprocedure Evaluation (Signed)
Anesthesia Post Note  Patient: Edwyna Ready  Procedure(s) Performed: Procedure(s) (LRB): Removal of rod at right L2 to L3, Transforaminal lumbar interbody fusion right L3-4 and right L4-5 with pedicle screws and rods L2, L3, L4 and L5, local bone graft, allograft and Vivigen (N/A)  Patient location during evaluation: PACU Anesthesia Type: General Level of consciousness: awake and alert and patient cooperative Pain management: pain level controlled Vital Signs Assessment: post-procedure vital signs reviewed and stable Respiratory status: spontaneous breathing and respiratory function stable Cardiovascular status: stable Anesthetic complications: no       Last Vitals:  Vitals:   01/21/16 1027 01/21/16 1030  BP: (!) 85/55 (!) 85/55  Pulse: 91 89  Resp: 19 17  Temp: 37.1 C     Last Pain:  Vitals:   01/21/16 1027  TempSrc: Oral  PainSc:                  Montz S

## 2016-01-21 NOTE — Progress Notes (Signed)
PT Cancellation Note  Patient Details Name: Kristen [name] MRN: [id number] DOB: 1965-02-18   Cancelled Treatment:    Reason Eval/Treat Not Completed: Patient not medically ready.  Pt with low BP at this time.  Will f/u as appropriate.     Thornton Papas Moya Duan 01/21/2016, 12:25 PM

## 2016-01-21 NOTE — Evaluation (Signed)
Occupational Therapy Evaluation Patient Details Name: Kristen [name] MRN: [id number] DOB: 10/03/1965 Today's Date: 01/21/2016    History of Present Illness 51 yo female s/p s/p TLIF L 3-4 L4-5 with pedicle screws with dural tear without CSF leak   Past Medical History:  Diagnosis Date  . Allergy   . Anemia    Iron deficiency  . Anxiety   . Arthritis    back, left shoulder  . Chicken pox   . Depression   . Diarrhea    takes Imodium daily  . GERD (gastroesophageal reflux disease)   . H/O hiatal hernia   . Headache(784.0)   . History of kidney stones    1996ish  . Hypertension   . Migraine    none for 5 years (as of 01/13/16)  . OSA (obstructive sleep apnea) 10/13/2015   unable to get cpap, plans to get one in 2018  . Pneumonia   . Restless legs   . Scoliosis   . Shortness of breath    with exertion      Clinical Impression   Patient is s/p TLIF L3-5 surgery resulting in functional limitations due to the deficits listed below (see OT problem list). PTA was independent with all adls and adls without any DME.  Patient will benefit from skilled OT acutely to increase independence and safety with ADLS to allow discharge home with 3n1 and RW. Session limited by dizziness and incontinence of bowel.      Follow Up Recommendations  No OT follow up    Equipment Recommendations  3 in 1 bedside commode;Other (comment) (RW)    Recommendations for Other Services       Precautions / Restrictions Precautions Precautions: Back Precaution Comments: back handout provided and reviwed in detail to adls       Mobility Bed Mobility Overal bed mobility: Needs Assistance Bed Mobility: Rolling;Supine to Sit Rolling: Min assist   Supine to sit: Mod assist     General bed mobility comments: (A) to elevate trunk and to sequence task   Transfers Overall transfer level: Needs assistance Equipment used: Rolling walker (2 wheeled) Transfers: Sit to/from Stand Sit to Stand:  Mod assist         General transfer comment: pt completed transfer but reports dizziness once up in chair     Balance                                            ADL Overall ADL's : Needs assistance/impaired Eating/Feeding: Independent   Grooming: Wash/dry hands;Wash/dry face;Set up;Sitting       Lower Body Bathing: Total assistance   Upper Body Dressing : Moderate assistance   Lower Body Dressing: Total assistance   Toilet Transfer: Minimal assistance   Toileting- Clothing Manipulation and Hygiene: Total assistance       Functional mobility during ADLs: Minimal assistance General ADL Comments: pt with urgency and incontinence of bowel in chair adn then transfered to 3n1 to void again     Vision     Perception     Praxis      Pertinent Vitals/Pain Pain Assessment: 0-10 Pain Score: 4  Pain Location: back and abdomen Pain Descriptors / Indicators: Discomfort;Operative site guarding Pain Intervention(s): Limited activity within patient's tolerance;Repositioned;Premedicated before session     Hand Dominance Right   Extremity/Trunk Assessment Upper Extremity Assessment Upper Extremity Assessment: Overall  WFL for tasks assessed   Lower Extremity Assessment Lower Extremity Assessment: Defer to PT evaluation   Cervical / Trunk Assessment Cervical / Trunk Assessment: Other exceptions (s/ surg)   Communication Communication Communication: No difficulties   Cognition Arousal/Alertness: Awake/alert Behavior During Therapy: WFL for tasks assessed/performed Overall Cognitive Status: Within Functional Limits for tasks assessed                     General Comments       Exercises       Shoulder Instructions      Home Living Family/patient expects to be discharged to:: Private residence Living Arrangements: Spouse/significant other Available Help at Discharge: Family;Available 24 hours/day Type of Home: House Home Access:  Level entry     Home Layout: One level     Bathroom Shower/Tub: Teacher, early years/pre: Standard     Home Equipment: None   Additional Comments:         Prior Functioning/Environment Level of Independence: Independent                 OT Problem List: Decreased strength;Decreased activity tolerance;Impaired balance (sitting and/or standing);Decreased safety awareness;Decreased knowledge of use of DME or AE;Decreased knowledge of precautions;Cardiopulmonary status limiting activity;Pain;Obesity   OT Treatment/Interventions: Self-care/ADL training;Therapeutic exercise;DME and/or AE instruction;Therapeutic activities;Patient/family education;Balance training    OT Goals(Current goals can be found in the care plan section) Acute Rehab OT Goals Patient Stated Goal: to get up today OT Goal Formulation: With patient Time For Goal Achievement: 02/04/16 Potential to Achieve Goals: Good  OT Frequency: Min 2X/week   Barriers to D/C:            Co-evaluation              End of Session Equipment Utilized During Treatment: Gait belt Nurse Communication: Mobility status;Precautions  Activity Tolerance: Patient tolerated treatment well Patient left: in bed;with call bell/phone within reach;with SCD's reapplied   Time: 5188-4166 OT Time Calculation (min): 45 min Charges:  OT General Charges $OT Visit: 1 Procedure OT Evaluation $OT Eval Moderate Complexity: 1 Procedure OT Treatments $Self Care/Home Management : 23-37 mins G-Codes:    Parke Poisson B 02/01/16, 9:33 AM   Jeri Modena   OTR/L Pager: [phone] Office: [phone] .

## 2016-01-21 NOTE — Progress Notes (Signed)
CRITICAL VALUE ALERT  Critical value received:  troponin  Date of notification:  01/21/2016  Time of notification:  3007  Critical value read back:Yes.    Nurse who received alert:  Pecolia Ades  MD notified (1st page):  Dr Louanne Skye  Time of first page:  1500  MD notified (2nd page):  Time of second page:  Responding MD:  Dr Louanne Skye  Time MD responded:  Consult cards

## 2016-01-21 NOTE — Progress Notes (Signed)
Bolus administered, pt not c/o pain, just a small headache, layed down.

## 2016-01-21 NOTE — Consult Note (Addendum)
Admit date: 01/20/2016 Referring Physician  Dr. Louanne Skye Primary Cardiologist  None Reason for Consultation  Chest pain and hypotension  HPI: This is a 51yo AAF with a history of GERD on PPI, depression, hiatal hernia, HTN and OSA who has L3-4 and L4-5 spinal stenosis and DJD and underwent lumbar spine surgery yesterday.  She did well post op but today developed chest burning very similar to her symptoms she gets with her GERD.  She is on PPI therapy but missed a few doses recently.  She started with burning and pressure mid sternal with belching.  This is identical to what she gets at home that improves with TUMS and Protonix.  She also developed hypotension after receiving her BP meds. She was given an IVF saline bolus and BP improved with SBP currently in the low 100's.  Troponin was noted to be 0.03 with a CPK of 1461, MB of 10.9 and normal relative index of 0.7.  EKG showed NSR with nonspecific anterolateral T wave abnormality. Cardiology is now asked to consult for further evaluation of CP and abnormal EKG.  She currently complains of some mild belching and chest burning.  She denies any SOB but does have some nausea and headache.  Her symptoms are identical to her GERD discomfort. Prior to her surgery she did not have any exertional CP or DOE.     PMH:   Past Medical History:  Diagnosis Date  . Allergy   . Anemia    Iron deficiency  . Anxiety   . Arthritis    back, left shoulder  . Chicken pox   . Depression   . Diarrhea    takes Imodium daily  . GERD (gastroesophageal reflux disease)   . H/O hiatal hernia   . Headache(784.0)   . History of kidney stones    1996ish  . Hypertension   . Migraine    none for 5 years (as of 01/13/16)  . OSA (obstructive sleep apnea) 10/13/2015   unable to get cpap, plans to get one in 2018  . Pneumonia   . Restless legs   . Scoliosis   . Shortness of breath    with exertion     PSH:   Past Surgical History:  Procedure Laterality Date  .  BACK SURGERY  07/21/1978  . CHOLECYSTECTOMY N/A 08/15/2013   Procedure: LAPAROSCOPIC CHOLECYSTECTOMY WITH INTRAOPERATIVE CHOLANGIOGRAM;  Surgeon: Gwenyth Ober, MD;  Location: [facility];  Service: General;  Laterality: N/A;  . COLONOSCOPY N/A 01/09/2013   Procedure: COLONOSCOPY;  Surgeon: Beryle Beams, MD;  Location: [facility];  Service: Endoscopy;  Laterality: N/A;  . HERNIA REPAIR Left 1981  . TONSILLECTOMY    . TUBAL LIGATION  06/1988    Allergies:  Other and Sulfur Prior to Admit Meds:   Prescriptions Prior to Admission  Medication Sig Dispense Refill Last Dose  . amLODipine (NORVASC) 10 MG tablet Take 10 mg by mouth daily.   01/19/2016 at Unknown time  . baclofen (LIORESAL) 10 MG tablet Take 10 mg by mouth 3 (three) times daily.   01/19/2016 at Unknown time  . Bismuth Subsalicylate 169 MG TABS Take 2 tablets by mouth every 8 (eight) hours as needed (for upset stomach/diarrhea).    01/19/2016 at Unknown time  . diclofenac (VOLTAREN) 75 MG EC tablet Take 75 mg by mouth 2 (two) times daily as needed (for pain.).    01/19/2016 at Unknown time  . gabapentin (NEURONTIN) 300 MG capsule Take 300 mg  by mouth 3 (three) times daily.   01/19/2016 at Unknown time  . labetalol (NORMODYNE) 100 MG tablet Take 1 tablet (100 mg total) by mouth 2 (two) times daily. 90 tablet 1 01/19/2016 at Unknown time  . lisinopril (PRINIVIL,ZESTRIL) 20 MG tablet Take 1 tablet (20 mg total) by mouth daily. 90 tablet 3 01/19/2016 at Unknown time  . loperamide (IMODIUM) 2 MG capsule Take 2-4 mg by mouth 2 (two) times daily as needed for diarrhea or loose stools.    01/20/2016 at 0500  . pantoprazole (PROTONIX) 40 MG tablet Take 1 tablet (40 mg total) by mouth daily. 30 tablet 3 Taking  . traMADol (ULTRAM) 50 MG tablet Take 2 tablets (100 mg total) by mouth every 6 (six) hours as needed for moderate pain. 60 tablet 0 01/19/2016 at Unknown time  . ibuprofen (ADVIL,MOTRIN) 200 MG tablet Take 200-400 mg by mouth every 6 (six) hours as needed for  fever, headache, mild pain, moderate pain or cramping.    More than a month at Unknown time  . tiZANidine (ZANAFLEX) 4 MG tablet Take 4 mg by mouth 2 (two) times daily as needed for muscle spasms.   More than a month at Unknown time   Fam HX:    Family History  Problem Relation Age of Onset  . Heart disease Father   . Heart attack Father   . Hypertension Mother   . Arthritis Mother   . Diabetes Maternal Grandmother   . Diabetes Paternal Grandmother   . Diabetes Maternal Uncle    Social HX:    Social History   Social History  . Marital status: Legally Separated    Spouse name: N/A  . Number of children: 3  . Years of education: 12   Occupational History  . Safety Assistant    Social History Main Topics  . Smoking status: Never Smoker  . Smokeless tobacco: Never Used  . Alcohol use 4.2 oz/week    7 Cans of beer per week     Comment: 1 beer daily  . Drug use: No  . Sexual activity: Yes    Birth control/ protection: Surgical   Other Topics Concern  . Not on file   Social History Narrative   Fun: Play with her grandkids, read     ROS:  All 11 ROS were addressed and are negative except what is stated in the HPI  Physical Exam: Blood pressure (!) 104/59, pulse (!) 113, temperature 98.8 F (37.1 C), temperature source Oral, resp. rate 16, height 5' 9"  (1.753 m), weight 271 lb 2.7 oz (123 kg), SpO2 96 %.    General: Well developed, well nourished, in no acute distress Head: Eyes PERRLA, No xanthomas.   Normal cephalic and atramatic  Lungs:   Clear bilaterally to auscultation and percussion. Heart:   HRRR S1 S2 Pulses are 2+ & equal.            No carotid bruit. No JVD.  No abdominal bruits. No femoral bruits. Abdomen: Bowel sounds are positive, abdomen soft and non-tender without masses Msk:  Back normal, normal gait. Normal strength and tone for age. Extremities:   No clubbing, cyanosis or edema.  DP +1 Neuro: Alert and oriented X 3. Psych:  Good affect, responds  appropriately    Labs:   Lab Results  Component Value Date   WBC 9.6 01/21/2016   HGB 10.2 (L) 01/21/2016   HCT 31.6 (L) 01/21/2016   MCV 84.7 01/21/2016   PLT 120 (  L) 01/21/2016    Recent Labs Lab 01/21/16 0751  NA 142  K 4.1  CL 111  CO2 24  BUN 16  CREATININE 1.06*  CALCIUM 7.7*  GLUCOSE 126*   No results found for: PTT Lab Results  Component Value Date   INR 0.94 01/13/2016   INR 1.0 12/22/2015   Lab Results  Component Value Date   CKTOTAL 1,461 (H) 01/21/2016   CKMB 10.9 (H) 01/21/2016   TROPONINI 0.03 (HH) 01/21/2016    No results found for: CHOL No results found for: HDL No results found for: LDLCALC No results found for: TRIG No results found for: CHOLHDL No results found for: LDLDIRECT    Radiology:  Dg Lumbar Spine Complete  Result Date: 01/20/2016 CLINICAL DATA:  Lumbar disc disease. EXAM: DG C-ARM GT 120 MIN; LUMBAR SPINE - COMPLETE 4+ VIEW FLUOROSCOPY TIME:  Fluoroscopy Time:  2 minutes 45 seconds COMPARISON:  MRI dated 11/19/2015 FINDINGS: Patient has undergone interbody and posterior fusion at L3-4 and L4-5. Alignment of the vertebra appears anatomic. Hardware appears in good position in the AP and lateral projections. IMPRESSION: Lumbar fusions performed at L3-4 and L4-5. Electronically Signed   By: Lorriane Shire M.D.   On: 01/20/2016 15:37   Dg C-arm Gt 120 Min  Result Date: 01/20/2016 CLINICAL DATA:  Lumbar disc disease. EXAM: DG C-ARM GT 120 MIN; LUMBAR SPINE - COMPLETE 4+ VIEW FLUOROSCOPY TIME:  Fluoroscopy Time:  2 minutes 45 seconds COMPARISON:  MRI dated 11/19/2015 FINDINGS: Patient has undergone interbody and posterior fusion at L3-4 and L4-5. Alignment of the vertebra appears anatomic. Hardware appears in good position in the AP and lateral projections. IMPRESSION: Lumbar fusions performed at L3-4 and L4-5. Electronically Signed   By: Lorriane Shire M.D.   On: 01/20/2016 15:37    EKG:  NSR with nonspecific anterolateral T wave  abnormality  ASSESSMENT/PLAN:   1.  Chest pain that is reminiscent of her GERD and she has missed several doses of her PPI recently.  Her symptoms improve with TUMS at home and her protonix.  EKG does show diffuse nonspecific T wave abnormality.  Trop normal.  CPK and MB elevated but total CPK elevated out of proportion to MB and relative index is normal making cardiac etiology unlikely.  Will check 2D echo to assess LVF.  Continue on PPI.  Consider oupt nuclear stress test if EF normal on Echo.  2.  Hypotension - likely related to a combination of volume depletion and BP meds.  Now resolved after IVF resuscitation.  3.  GERD - continue PPI  4.  HTN - BP controlled.  Hold amlodipine and ACE I due to recent hypotension.  Continue BB.  Fransico Him, MD  01/21/2016  9:08 PM

## 2016-01-21 NOTE — Progress Notes (Addendum)
     Subjective: 1 Day Post-Op Procedure(s) (LRB): Removal of rod at right L2 to L3, Transforaminal lumbar interbody fusion right L3-4 and right L4-5 with pedicle screws and rods L2, L3, L4 and L5, local bone graft, allograft and Vivigen (N/A) Awake, alert and oriented x 4, No leg pain, no HA, no nausea. Some chest discomfort, pain relative to pressure areas from prolong prone positioning with chest and lower abdomenal pads. Reflux discomfort, GERD, no significant cardiac history.   Patient reports pain as moderate.    Objective:   VITALS:  Temp:  [97.3 F (36.3 C)-98.7 F (37.1 C)] 98.7 F (37.1 C) (01/03 0341) Pulse Rate:  [73-99] 99 (01/03 0341) Resp:  [14-19] 19 (01/03 0341) BP: (102-127)/(75-94) 111/76 (01/03 0341) SpO2:  [95 %-99 %] 98 % (01/03 0341) Weight:  [271 lb 2.7 oz (123 kg)] 271 lb 2.7 oz (123 kg) (01/02 2200)  Neurologically intact ABD soft Neurovascular intact Sensation intact distally Intact pulses distally Dorsiflexion/Plantar flexion intact Incision: dressing C/D/I   LABS  Recent Labs  01/20/16 1751 01/21/16 0751  HGB 11.5* 10.2*  WBC  --  9.6  PLT  --  120*    Recent Labs  01/20/16 1751  NA 139  K 4.6  CL 107  CO2 23  BUN 16  CREATININE 1.23*  GLUCOSE 166*   No results for input(s): LABPT, INR in the last 72 hours.   Assessment/Plan: 1 Day Post-Op Procedure(s) (LRB): Removal of rod at right L2 to L3, Transforaminal lumbar interbody fusion right L3-4 and right L4-5 with pedicle screws and rods L2, L3, L4 and L5, local bone graft, allograft and Vivigen (N/A)  Anemia, mild. Dural Tear, small without CSF leak, good repair, suture and duraseal, may mobilize today.  Advance diet Up with therapy  Will mobilize even without brace. Check lab Transfer to Med-Surg floor. Head of bed elevated as tolerated Ortho tech contacted and will order LSO.  Jessy Oto 01/21/2016, 8:35 AM Patient ID: Kristen [name], female   DOB: [birth year], 51  y.o.   MRN: [id number]

## 2016-01-21 NOTE — Progress Notes (Signed)
Patient ID: Kristen [name], female   DOB: October 16, 1965, 51 y.o.   MRN: [id number]   Just spoke with cardiologist Dr Claiborne Billings regarding hypotension, abnormal EKG changes and abnormal troponin.  Stated that he would assign someone to see patient today.  Greatly appreciate cardiology assistance.

## 2016-01-22 DIAGNOSIS — K529 Noninfective gastroenteritis and colitis, unspecified: Secondary | ICD-10-CM | POA: Diagnosis present

## 2016-01-22 DIAGNOSIS — D696 Thrombocytopenia, unspecified: Secondary | ICD-10-CM | POA: Diagnosis present

## 2016-01-22 LAB — BASIC METABOLIC PANEL
Anion gap: 4 — ABNORMAL LOW (ref 5–15)
BUN: 15 mg/dL (ref 6–20)
CHLORIDE: 115 mmol/L — AB (ref 101–111)
CO2: 23 mmol/L (ref 22–32)
Calcium: 7.5 mg/dL — ABNORMAL LOW (ref 8.9–10.3)
Creatinine, Ser: 0.96 mg/dL (ref 0.44–1.00)
GFR calc non Af Amer: >60 mL/min (ref >60)
Glucose, Bld: 113 mg/dL — ABNORMAL HIGH (ref 65–99)
Potassium: 3.8 mmol/L (ref 3.5–5.1)
Sodium: 142 mmol/L (ref 135–145)

## 2016-01-22 LAB — CBC WITH DIFFERENTIAL/PLATELET
Basophils Absolute: 0 10*3/uL (ref 0.0–0.1)
Basophils Relative: 0 %
Eosinophils Absolute: 0.1 10*3/uL (ref 0.0–0.7)
Eosinophils Relative: 1 %
HEMATOCRIT: 26 % — AB (ref 36.0–46.0)
HEMOGLOBIN: 8.2 g/dL — AB (ref 12.0–15.0)
LYMPHS ABS: 2 10*3/uL (ref 0.7–4.0)
Lymphocytes Relative: 25 %
MCH: 27.2 pg (ref 26.0–34.0)
MCHC: 31.5 g/dL (ref 30.0–36.0)
MCV: 86.4 fL (ref 78.0–100.0)
MONOS PCT: 8 %
Monocytes Absolute: 0.6 10*3/uL (ref 0.1–1.0)
NEUTROS ABS: 5.4 10*3/uL (ref 1.7–7.7)
Neutrophils Relative %: 66 %
Platelets: 102 10*3/uL — ABNORMAL LOW (ref 150–400)
RBC: 3.01 MIL/uL — ABNORMAL LOW (ref 3.87–5.11)
RDW: 14.8 % (ref 11.5–15.5)
WBC: 8.2 10*3/uL (ref 4.0–10.5)

## 2016-01-22 LAB — URINALYSIS, ROUTINE W REFLEX MICROSCOPIC
BACTERIA UA: NONE SEEN
BILIRUBIN URINE: NEGATIVE
GLUCOSE, UA: NEGATIVE mg/dL
HGB URINE DIPSTICK: NEGATIVE
KETONES UR: NEGATIVE mg/dL
NITRITE: NEGATIVE
PH: 5 (ref 5.0–8.0)
Protein, ur: NEGATIVE mg/dL
Specific Gravity, Urine: 1.014 (ref 1.005–1.030)

## 2016-01-22 LAB — PREPARE RBC (CROSSMATCH)

## 2016-01-22 LAB — TROPONIN I

## 2016-01-22 LAB — SODIUM, URINE, RANDOM: Sodium, Ur: 66 mmol/L

## 2016-01-22 LAB — CORTISOL-AM, BLOOD: Cortisol - AM: 5.7 ug/dL — ABNORMAL LOW (ref 6.7–22.6)

## 2016-01-22 MED ORDER — HYDROCORTISONE NA SUCCINATE PF 100 MG IJ SOLR
100.0000 mg | Freq: Three times a day (TID) | INTRAMUSCULAR | Status: AC
  Administered 2016-01-22 – 2016-01-23 (×3): 100 mg via INTRAVENOUS
  Filled 2016-01-22 (×3): qty 2

## 2016-01-22 MED ORDER — CALCIUM CARBONATE 1250 (500 CA) MG PO TABS
1.0000 | ORAL_TABLET | Freq: Three times a day (TID) | ORAL | Status: DC
  Administered 2016-01-22 – 2016-01-26 (×12): 500 mg via ORAL
  Filled 2016-01-22 (×12): qty 1

## 2016-01-22 MED ORDER — BISMUTH SUBSALICYLATE 262 MG PO TABS
2.0000 | ORAL_TABLET | Freq: Three times a day (TID) | ORAL | Status: DC | PRN
  Filled 2016-01-22: qty 2

## 2016-01-22 MED ORDER — SODIUM CHLORIDE 0.9 % IV SOLN
Freq: Once | INTRAVENOUS | Status: AC
  Administered 2016-01-22: 13:00:00 via INTRAVENOUS

## 2016-01-22 MED FILL — Sodium Chloride Irrigation Soln 0.9%: Qty: 3000 | Status: AC

## 2016-01-22 MED FILL — Heparin Sodium (Porcine) Inj 1000 Unit/ML: INTRAMUSCULAR | Qty: 30 | Status: AC

## 2016-01-22 MED FILL — Sodium Chloride IV Soln 0.9%: INTRAVENOUS | Qty: 1000 | Status: AC

## 2016-01-22 NOTE — Evaluation (Signed)
Physical Therapy Evaluation Patient Details Name: Kristen [name] MRN: [id number] DOB: February 07, 1965 Today's Date: 01/22/2016   History of Present Illness  51 yo female s/p s/p TLIF L 3-4 L4-5 with pedicle screws with dural tear without CSF leak.  pt with hx of Anxiety, Depression, HTN, RLS, and previous Back Surgery.    Clinical Impression  Pt with much improved BP as compared to yesterday.  Pt able to ambulate in hallways maintaining BP and without C/O dizziness.  Feel pt will be able to progress to returning to home.      Follow Up Recommendations Home health PT;Supervision/Assistance - 24 hour    Equipment Recommendations  Rolling walker with 5" wheels;3in1 (PT)    Recommendations for Other Services       Precautions / Restrictions Precautions Precautions: Back Precaution Booklet Issued: Yes (comment) Precaution Comments: Reviewed back precautions. Required Braces or Orthoses: Spinal Brace Spinal Brace: Lumbar corset;Applied in sitting position Restrictions Weight Bearing Restrictions: No      Mobility  Bed Mobility               General bed mobility comments: pt sitting up in recliner.  Transfers Overall transfer level: Needs assistance Equipment used: Rolling walker (2 wheeled) Transfers: Sit to/from Stand Sit to Stand: Min assist         General transfer comment: cues for UE use and controlling descent to sitting.    Ambulation/Gait Ambulation/Gait assistance: Min guard Ambulation Distance (Feet): 100 Feet Assistive device: Rolling walker (2 wheeled) Gait Pattern/deviations: Step-through pattern;Decreased stride length     General Gait Details: pt moves slowly, but without c/o dizziness.  BP remained stable with sustolic remaining in upper 90s to low 100 throughout session.    Stairs            Wheelchair Mobility    Modified Rankin (Stroke Patients Only)       Balance Overall balance assessment: Needs assistance Sitting-balance  support: No upper extremity supported;Feet supported Sitting balance-Leahy Scale: Good     Standing balance support: Bilateral upper extremity supported;Single extremity supported;During functional activity Standing balance-Leahy Scale: Poor                               Pertinent Vitals/Pain Pain Assessment: 0-10 Pain Score: 4  Pain Location: back and abdomen Pain Descriptors / Indicators: Discomfort;Operative site guarding Pain Intervention(s): Monitored during session;Premedicated before session;Repositioned    Home Living Family/patient expects to be discharged to:: Private residence Living Arrangements: Spouse/significant other Available Help at Discharge: Family;Available 24 hours/day Type of Home: House Home Access: Level entry     Home Layout: One level Home Equipment: None      Prior Function Level of Independence: Independent               Hand Dominance   Dominant Hand: Right    Extremity/Trunk Assessment   Upper Extremity Assessment Upper Extremity Assessment: Defer to OT evaluation    Lower Extremity Assessment Lower Extremity Assessment: Generalized weakness       Communication   Communication: No difficulties  Cognition Arousal/Alertness: Awake/alert Behavior During Therapy: WFL for tasks assessed/performed Overall Cognitive Status: Within Functional Limits for tasks assessed                      General Comments      Exercises     Assessment/Plan    PT Assessment Patient needs continued PT services  PT Problem List Decreased strength;Decreased activity tolerance;Decreased balance;Decreased mobility;Decreased knowledge of use of DME;Decreased knowledge of precautions;Pain          PT Treatment Interventions DME instruction;Gait training;Stair training;Therapeutic activities;Functional mobility training;Therapeutic exercise;Balance training;Patient/family education    PT Goals (Current goals can be found in  the Care Plan section)  Acute Rehab PT Goals Patient Stated Goal: To get home PT Goal Formulation: With patient Time For Goal Achievement: 01/29/16 Potential to Achieve Goals: Good    Frequency Min 5X/week   Barriers to discharge        Co-evaluation               End of Session Equipment Utilized During Treatment: Gait belt;Back brace Activity Tolerance: Patient tolerated treatment well Patient left: in chair;with call bell/phone within reach;with family/visitor present Nurse Communication: Mobility status         Time: 1001-1017 PT Time Calculation (min) (ACUTE ONLY): 16 min   Charges:   PT Evaluation $PT Eval Moderate Complexity: 1 Procedure     PT G CodesThornton Papas Thien Berka, PT  [phone] 01/22/2016, 11:52 AM

## 2016-01-22 NOTE — Consult Note (Signed)
Consultation Note   ISLAND DOHMEN HKV:[id number] DOB: 1965-05-03 DOA: 01/20/2016   PCP: Mauricio Po, FNP   Patient coming from/Resides with: Private residence  Requesting physician: Dr. Louanne Skye  Reason for consultation: Hypotension  HPI: Kristen [name] is a 51 y.o. female with medical history significant for morbid obesity (BMI 40.1), remote history of surgical fixation for scoliosis, GERD, sleep apnea, hypertension, known spinal stenosis with recent neurogenic claudication symptoms. Patient was brought to the hospital for elective lumbar procedure consisting of removal of right-sided lumbar rod with interbody fusion pedicle screws bone graft and allograft. the attending surgeon reported that the patient had 1500 mL EBL with about 750 mL returned from Cell Saver. In the postoperative period patient has had chest pain evaluated by cardiology felt to be noncardiac in etiology. She's had issues with both seated and orthostatic hypotension. Because of history of frequent spinal injections with steroids for her chronic pain requesting physician was concerned over possible adrenal insufficiency and checked a cortisol level which was low at 5.7. In addition she appears to be experiencing symptomatic anemia with a baseline hemoglobin between 12 and 13 with current hemoglobin 8.2. She has also developed a new thrombocytopenia postoperatively.  In discussion with the patient, she has not had any significant symptoms prior to her surgery other than the back pain in the lower extremity weakness and discomfort with ambulation. She reports the back pain in the lower extremity neurogenic symptoms have improved markedly since her surgery. She is currently complaining of severe headache and requesting her home medications that she uses to treat her chronic diarrhea.   Review of Systems:  In addition to the HPI above,  No Fever-chills, myalgias or other constitutional symptoms No changes with Vision or  hearing, new weakness, tingling; he reports significant improvement and preadmission lower extremity numbness, dizziness, dysarthria or word finding difficulty, gait disturbance or imbalance, tremors or seizure activity No problems swallowing food or Liquids, indigestion/reflux, choking or coughing while eating, abdominal pain with or after eating No Cough or Shortness of Breath, palpitations, orthopnea or DOE No Abdominal pain, N/V, melena,hematochezia, dark tarry stools, constipation No dysuria, malodorous urine, hematuria or flank pain No new skin rashes, lesions, masses or bruises, No new joint pains, aches, swelling or redness No recent unintentional weight gain or loss No polyuria, polydypsia or polyphagia   Past Medical History:  Diagnosis Date  . Allergy   . Anemia    Iron deficiency  . Anxiety   . Arthritis    back, left shoulder  . Chicken pox   . Depression   . Diarrhea    takes Imodium daily  . GERD (gastroesophageal reflux disease)   . H/O hiatal hernia   . Headache(784.0)   . History of kidney stones    1996ish  . Hypertension   . Migraine    none for 5 years (as of 01/13/16)  . OSA (obstructive sleep apnea) 10/13/2015   unable to get cpap, plans to get one in 2018  . Pneumonia   . Restless legs   . Scoliosis   . Shortness of breath    with exertion    Past Surgical History:  Procedure Laterality Date  . BACK SURGERY  07/21/1978  . CHOLECYSTECTOMY N/A 08/15/2013   Procedure: LAPAROSCOPIC CHOLECYSTECTOMY WITH INTRAOPERATIVE CHOLANGIOGRAM;  Surgeon: Gwenyth Ober, MD;  Location: [facility];  Service: General;  Laterality: N/A;  . COLONOSCOPY N/A 01/09/2013   Procedure: COLONOSCOPY;  Surgeon: Beryle Beams, MD;  Location[facility]  ENDOSCOPY;  Service: Endoscopy;  Laterality: N/A;  . HERNIA REPAIR Left 1981  . TONSILLECTOMY    . TUBAL LIGATION  06/1988    Social History   Social History  . Marital status: Legally Separated    Spouse name: N/A  . Number of children:  3  . Years of education: 12   Occupational History  . Safety Assistant    Social History Main Topics  . Smoking status: Never Smoker  . Smokeless tobacco: Never Used  . Alcohol use 4.2 oz/week    7 Cans of beer per week     Comment: 1 beer daily  . Drug use: No  . Sexual activity: Yes    Birth control/ protection: Surgical   Other Topics Concern  . Not on file   Social History Narrative   Fun: Play with her grandkids, read    Mobility: Without assistive devices prior to admission Work history: Not obtained   Allergies  Allergen Reactions  . Other Hives and Swelling    tomato  . Sulfur Hives and Swelling    Family History  Problem Relation Age of Onset  . Heart disease Father   . Heart attack Father   . Hypertension Mother   . Arthritis Mother   . Diabetes Maternal Grandmother   . Diabetes Paternal Grandmother   . Diabetes Maternal Uncle      Prior to Admission medications   Medication Sig Start Date End Date Taking? Authorizing Provider  amLODipine (NORVASC) 10 MG tablet Take 10 mg by mouth daily. 10/18/15  Yes Historical Provider, MD  baclofen (LIORESAL) 10 MG tablet Take 10 mg by mouth 3 (three) times daily.   Yes Historical Provider, MD  Bismuth Subsalicylate 694 MG TABS Take 2 tablets by mouth every 8 (eight) hours as needed (for upset stomach/diarrhea).    Yes Historical Provider, MD  diclofenac (VOLTAREN) 75 MG EC tablet Take 75 mg by mouth 2 (two) times daily as needed (for pain.).    Yes Historical Provider, MD  gabapentin (NEURONTIN) 300 MG capsule Take 300 mg by mouth 3 (three) times daily.   Yes Historical Provider, MD  labetalol (NORMODYNE) 100 MG tablet Take 1 tablet (100 mg total) by mouth 2 (two) times daily. 07/09/15  Yes Golden Circle, FNP  lisinopril (PRINIVIL,ZESTRIL) 20 MG tablet Take 1 tablet (20 mg total) by mouth daily. 06/18/15  Yes Golden Circle, FNP  loperamide (IMODIUM) 2 MG capsule Take 2-4 mg by mouth 2 (two) times daily as needed  for diarrhea or loose stools.    Yes Historical Provider, MD  pantoprazole (PROTONIX) 40 MG tablet Take 1 tablet (40 mg total) by mouth daily. 07/09/15  Yes Golden Circle, FNP  traMADol (ULTRAM) 50 MG tablet Take 2 tablets (100 mg total) by mouth every 6 (six) hours as needed for moderate pain. 12/29/15  Yes Jessy Oto, MD  ibuprofen (ADVIL,MOTRIN) 200 MG tablet Take 200-400 mg by mouth every 6 (six) hours as needed for fever, headache, mild pain, moderate pain or cramping.     Historical Provider, MD  tiZANidine (ZANAFLEX) 4 MG tablet Take 4 mg by mouth 2 (two) times daily as needed for muscle spasms.    Historical Provider, MD    Physical Exam: Vitals:   01/22/16 0745 01/22/16 1100 01/22/16 1200 01/22/16 1254  BP: 101/62  (!) 89/59 102/61  Pulse: (!) 115  (!) 108 100  Resp: 17     Temp: 100.2 F (37.9 C) 98.5  F (36.9 C)    TempSrc: Axillary Oral    SpO2: 95%  100%   Weight:      Height:          Constitutional: NAD, calm, comfortable Eyes: PERRL, lids and conjunctivae normal ENMT: Mucous membranes are moist. Posterior pharynx clear of any exudate or lesions.Normal dentition.  Neck: normal, supple, no masses, no thyromegaly Respiratory: clear to auscultation bilaterally, no wheezing, no crackles. Normal respiratory effort. No accessory muscle use.  Cardiovascular: Regular rate and rhythm, no murmurs / rubs / gallops. No extremity edema. 2+ pedal pulses. No carotid bruits.  Abdomen: no tenderness, no masses palpated. No hepatosplenomegaly. Bowel sounds positive.  Musculoskeletal: no clubbing / cyanosis. No joint deformity upper and lower extremities. Good ROM, no contractures. Normal muscle tone.  Skin: no rashes, lesions, ulcers. No induration Neurologic: CN 2-12 grossly intact. Sensation intact, DTR normal. Strength 5/5 x all 4 extremities.  Psychiatric: Normal judgment and insight. Alert and oriented x 3. Normal mood.    Labs on Admission: I have personally reviewed  following labs and imaging studies  CBC:  Recent Labs Lab 01/20/16 1503 01/20/16 1604 01/20/16 1751 01/21/16 0751 01/22/16 0519  WBC  --   --   --  9.6 8.2  NEUTROABS  --   --   --   --  5.4  HGB 10.9* 11.2* 11.5* 10.2* 8.2*  HCT 32.0* 33.0* 36.7 31.6* 26.0*  MCV  --   --   --  84.7 86.4  PLT  --   --   --  120* 845*   Basic Metabolic Panel:  Recent Labs Lab 01/20/16 1503 01/20/16 1604 01/20/16 1751 01/21/16 0751 01/22/16 0519  NA 142 142 139 142 142  K 4.0 3.9 4.6 4.1 3.8  CL  --   --  107 111 115*  CO2  --   --  23 24 23   GLUCOSE 133* 137* 166* 126* 113*  BUN  --   --  16 16 15   CREATININE  --   --  1.23* 1.06* 0.96  CALCIUM  --   --  7.9* 7.7* 7.5*   GFR: Estimated Creatinine Clearance: 98.4 mL/min (by C-G formula based on SCr of 0.96 mg/dL). Liver Function Tests: No results for input(s): AST, ALT, ALKPHOS, BILITOT, PROT, ALBUMIN in the last 168 hours. No results for input(s): LIPASE, AMYLASE in the last 168 hours. No results for input(s): AMMONIA in the last 168 hours. Coagulation Profile: No results for input(s): INR, PROTIME in the last 168 hours. Cardiac Enzymes:  Recent Labs Lab 01/21/16 1327 01/21/16 2240 01/22/16 0519  CKTOTAL 1,461*  --   --   CKMB 10.9*  --   --   TROPONINI 0.03* <0.03 <0.03  <0.03   BNP (last 3 results) No results for input(s): PROBNP in the last 8760 hours. HbA1C: No results for input(s): HGBA1C in the last 72 hours. CBG: No results for input(s): GLUCAP in the last 168 hours. Lipid Profile: No results for input(s): CHOL, HDL, LDLCALC, TRIG, CHOLHDL, LDLDIRECT in the last 72 hours. Thyroid Function Tests: No results for input(s): TSH, T4TOTAL, FREET4, T3FREE, THYROIDAB in the last 72 hours. Anemia Panel: No results for input(s): VITAMINB12, FOLATE, FERRITIN, TIBC, IRON, RETICCTPCT in the last 72 hours. Urine analysis:    Component Value Date/Time   COLORURINE YELLOW 01/13/2016 [place] 01/13/2016  0855   LABSPEC 1.013 01/13/2016 0855   PHURINE 6.0 01/13/2016 0855   GLUCOSEU NEGATIVE 01/13/2016 3646  HGBUR SMALL (A) 01/13/2016 0855   BILIRUBINUR NEGATIVE 01/13/2016 0855   KETONESUR NEGATIVE 01/13/2016 0855   PROTEINUR NEGATIVE 01/13/2016 0855   UROBILINOGEN 1.0 01/08/2013 0912   NITRITE POSITIVE (A) 01/13/2016 0855   LEUKOCYTESUR TRACE (A) 01/13/2016 0855   Sepsis Labs: @LABRCNTIP (procalcitonin:4,lacticidven:4) ) Recent Results (from the past 240 hour(s))  Surgical pcr screen     Status: None   Collection Time: 01/13/16  8:58 AM  Result Value Ref Range Status   MRSA, PCR NEGATIVE NEGATIVE Final   Staphylococcus aureus NEGATIVE NEGATIVE Final    Comment:        The Xpert SA Assay (FDA approved for NASAL specimens in patients over 39 years of age), is one component of a comprehensive surveillance program.  Test performance has been validated by [facility] for patients greater than or equal to 16 year old. It is not intended to diagnose infection nor to guide or monitor treatment.   MRSA PCR Screening     Status: None   Collection Time: 01/20/16 10:12 PM  Result Value Ref Range Status   MRSA by PCR NEGATIVE NEGATIVE Final    Comment:        The GeneXpert MRSA Assay (FDA approved for NASAL specimens only), is one component of a comprehensive MRSA colonization surveillance program. It is not intended to diagnose MRSA infection nor to guide or monitor treatment for MRSA infections.      Radiological Exams on Admission: Dg Lumbar Spine Complete  Result Date: 01/20/2016 CLINICAL DATA:  Lumbar disc disease. EXAM: DG C-ARM GT 120 MIN; LUMBAR SPINE - COMPLETE 4+ VIEW FLUOROSCOPY TIME:  Fluoroscopy Time:  2 minutes 45 seconds COMPARISON:  MRI dated 11/19/2015 FINDINGS: Patient has undergone interbody and posterior fusion at L3-4 and L4-5. Alignment of the vertebra appears anatomic. Hardware appears in good position in the AP and lateral projections. IMPRESSION: Lumbar  fusions performed at L3-4 and L4-5. Electronically Signed   By: Lorriane Shire M.D.   On: 01/20/2016 15:37   Dg C-arm Gt 120 Min  Result Date: 01/20/2016 CLINICAL DATA:  Lumbar disc disease. EXAM: DG C-ARM GT 120 MIN; LUMBAR SPINE - COMPLETE 4+ VIEW FLUOROSCOPY TIME:  Fluoroscopy Time:  2 minutes 45 seconds COMPARISON:  MRI dated 11/19/2015 FINDINGS: Patient has undergone interbody and posterior fusion at L3-4 and L4-5. Alignment of the vertebra appears anatomic. Hardware appears in good position in the AP and lateral projections. IMPRESSION: Lumbar fusions performed at L3-4 and L4-5. Electronically Signed   By: Lorriane Shire M.D.   On: 01/20/2016 15:37    Assessment/Plan Principal Problem:   Orthostatic hypotension -Patient was normotensive preoperatively/time of admission: 131/91 -Postoperatively blood pressures have ranged from 78 systolic to 096 systolic with lowest blood pressure today 89/59 -Agree with discontinuation of lisinopril, have also discontinue Normodyne -Suspect symptomatic anemia contributing (see below) -Patient is currently 6 L positive so I have opted to slow fluids to 50 mL per hour -Anticipate with transfusion blood pressure should improve -Random a.m. cortisol low at 5.7-stress dose steroids/Solu-Cortef every 8 hours initiated prior to consultation-will continue until normotensive then begin to wean and DC -If hypotension recurs will likely require cosyntropin stimulation test; I suspect though that with presentation of normal blood pressure that true adrenal insufficiency is not present and instead patient has inadequate stress response to waxing and waning hypotension  Active Problems:   Postoperative anemia -Preoperative hemoglobin between 12 and 13 -Current hemoglobin 8.2  -Documentation of intake/output reveals patient lost 2600 mL of  blood with 1300 mL Cell Saver given -We'll go ahead and transfuse 2 units packed red blood cells today and repeat CBC after blood  infused -Check TSH and anemia panel    Thrombocytopenia  -Likely related to consumption in regards to blood loss during surgical procedure, infusion of Cell Saver product, response to anemia -Current platelets greater than 100,000 -For completeness of exam check LDH, haptoglobin and have pathologist review peripheral smear re: ? schistocytes-patient has headache but no petechial rash so less likely TTP -CBC in a.m. -CMET in the a.m. Re: check for elevated bilirubin level that would be suggestive of hemolysis    Essential hypertension -Current blood pressure suboptimal so preadmission lisinopril and Normodyne on hold    Hypocalcemia -Corrected calcium remains low at 7.66 -Begin high-dose calcium 1250 mg 3 times a day -If patient has not undergone bone densitometry as an outpatient recommend this be pursued after discharge -Check a vitamin D level and magnesium    Morbid obesity with BMI of 40.0-44.9, adult/OSA (obstructive sleep apnea) -Patient reports was prescribed CPAP machine in the past but unable to afford -Order Nocturnal CPAP -Unclear when last PSG was completed and if she will continue to be eligible for CPAP or if PSG will need to be repeated    Spinal stenosis of lumbar region with neurogenic claudication -Status post complex surgical procedure -Management per orthopedic team -Discussed elevated CK with Dr.Nitka, who reports this would not be unusual given the patient's prolonged surgical procedure -Check follow-up CK    Chronic diarrhea -Resume home medications    Chest pain -Cardiology has been consulted -EKG and troponins are unremarkable and based on cardiology notes not consistent with cardiac etiology -Echocardiogram pending      DVT prophylaxis: SCDs Code Status: Full Family Communication: No family at bedside at time of evaluation Disposition Plan: At discretion of primary team     Kylo Gavin L. ANP-BC [facility] Pager  [phone]   If 7PM-7AM, please contact night-coverage [email or web address] Password [email or web address]  01/22/2016, 1:00 PM

## 2016-01-22 NOTE — Progress Notes (Signed)
Rn notified Dr Louanne Skye that patient's blood pressures have trended down into systolic high 68S and low 90s. Most recent blood pressure is 89/59. RN also notified him that cardiology recommended medicine consult. No order given right now. RN did notify him that patient's hydrocortisone had been given, when MD asked. Patient voices no complaints, patient had been sitting up in the chair after therapy saw her. Patient assisted back to bed with walker and Rn assistance with no adverse events.

## 2016-01-22 NOTE — Progress Notes (Signed)
Patient ID: Edwyna Ready, female   DOB: 1965/03/11, 51 y.o.   MRN: [id number] Patient seen this AM on rounds awake and alert, tolerating pos. Systolic BP had improved to 105-110 no antihypertensive agents. Awaiting scheduled echocardiogram. Urine output 4 liters yesterday, volume input similar about 4 liters.  O2 saturations are normal and she is in no acute distress. Cardiology is recommending medicine consultation.  Legs are N-V normal.  I called and spoke with [facility] and they will see Mrs. Efferson. Appreciate help and input from Cardiology and [facility]. AM Cortisol level is borderline low so I ordered Hydrocortisone 100 mg TID for 4 doses.  Hgb is 8.6 decreased from preop 13.  Overall no sign of neurocompromise but volume replacement and anemia due to blood loss intraoperatively. Will stay in stepdown till BP is more stable.

## 2016-01-22 NOTE — Progress Notes (Signed)
Patient Name: Kristen [name] Date of Encounter: 01/22/2016  Primary Cardiologist: [facility] Problem List     Principal Problem:   Spinal stenosis of lumbar region Active Problems:   DDD (degenerative disc disease), lumbar   Spinal stenosis of lumbar region with neurogenic claudication   Chest pain   Essential hypertension   Orthostatic hypotension     Subjective   Chest discomfort much improved today.  Sitting up in chair.  Tachycardic at 118bpm in sinus  Inpatient Medications    Scheduled Meds: . docusate sodium  100 mg Oral BID  . gabapentin  300 mg Oral TID  . hydrocortisone sod succinate (SOLU-CORTEF) inj  100 mg Intravenous Q8H  . labetalol  100 mg Oral BID  . pantoprazole  40 mg Oral Daily  . sodium chloride flush  3 mL Intravenous Q12H   Continuous Infusions: . sodium chloride    . sodium chloride 125 mL/hr at 01/22/16 0829   PRN Meds: acetaminophen **OR** acetaminophen, alum & mag hydroxide-simeth, bisacodyl, HYDROcodone-acetaminophen, loperamide, menthol-cetylpyridinium **OR** phenol, methocarbamol **OR** methocarbamol (ROBAXIN)  IV, morphine injection, ondansetron (ZOFRAN) IV, oxyCODONE-acetaminophen, polyethylene glycol, sodium chloride flush, sodium phosphate, tiZANidine   Vital Signs    Vitals:   01/21/16 1943 01/21/16 2259 01/22/16 0309 01/22/16 0745  BP: (!) 104/59 99/60 106/69 101/62  Pulse: (!) 113 (!) 103 (!) 119 (!) 115  Resp: 16 18 13 17   Temp: 98.8 F (37.1 C) 99.6 F (37.6 C) 99.5 F (37.5 C) 100.2 F (37.9 C)  TempSrc: Oral Oral Axillary Axillary  SpO2: 96% 93% 98% 95%  Weight:      Height:        Intake/Output Summary (Last 24 hours) at 01/22/16 1105 Last data filed at 01/22/16 0400  Gross per 24 hour  Intake          3597.51 ml  Output             1050 ml  Net          2547.51 ml   Filed Weights   01/20/16 2200  Weight: 271 lb 2.7 oz (123 kg)    Physical Exam   GEN: Well nourished, well developed, in no  acute distress.  HEENT: Grossly normal.  Neck: Supple, no JVD, carotid bruits, or masses. Cardiac: RRR, no murmurs, rubs, or gallops. No clubbing, cyanosis, edema.  Radials/DP/PT 2+ and equal bilaterally.  Respiratory:  Respirations regular and unlabored, clear to auscultation bilaterally. GI: Soft, nontender, nondistended, BS + x 4. MS: no deformity or atrophy. Skin: warm and dry, no rash. Neuro:  Strength and sensation are intact. Psych: AAOx3.  Normal affect.  Labs    CBC  Recent Labs  01/21/16 0751 01/22/16 0519  WBC 9.6 8.2  NEUTROABS  --  5.4  HGB 10.2* 8.2*  HCT 31.6* 26.0*  MCV 84.7 86.4  PLT 120* 009*   Basic Metabolic Panel  Recent Labs  01/21/16 0751 01/22/16 0519  NA 142 142  K 4.1 3.8  CL 111 115*  CO2 24 23  GLUCOSE 126* 113*  BUN 16 15  CREATININE 1.06* 0.96  CALCIUM 7.7* 7.5*   Liver Function Tests No results for input(s): AST, ALT, ALKPHOS, BILITOT, PROT, ALBUMIN in the last 72 hours. No results for input(s): LIPASE, AMYLASE in the last 72 hours. Cardiac Enzymes  Recent Labs  01/21/16 1327 01/21/16 2240 01/22/16 0519  CKTOTAL 1,461*  --   --   CKMB 10.9*  --   --  TROPONINI 0.03* <0.03 <0.03  <0.03   BNP Invalid input(s): POCBNP D-Dimer No results for input(s): DDIMER in the last 72 hours. Hemoglobin A1C No results for input(s): HGBA1C in the last 72 hours. Fasting Lipid Panel No results for input(s): CHOL, HDL, LDLCALC, TRIG, CHOLHDL, LDLDIRECT in the last 72 hours. Thyroid Function Tests No results for input(s): TSH, T4TOTAL, T3FREE, THYROIDAB in the last 72 hours.  Invalid input(s): FREET3  Telemetry    - Personally Reviewed  ECG    01/21/16- Normal sinus rhythm at 84 bpm, LVH and non-specific anterolateral T wave abnormality - Personally Reviewed  Radiology    Dg Lumbar Spine Complete  Result Date: 01/20/2016 CLINICAL DATA:  Lumbar disc disease. EXAM: DG C-ARM GT 120 MIN; LUMBAR SPINE - COMPLETE 4+ VIEW FLUOROSCOPY  TIME:  Fluoroscopy Time:  2 minutes 45 seconds COMPARISON:  MRI dated 11/19/2015 FINDINGS: Patient has undergone interbody and posterior fusion at L3-4 and L4-5. Alignment of the vertebra appears anatomic. Hardware appears in good position in the AP and lateral projections. IMPRESSION: Lumbar fusions performed at L3-4 and L4-5. Electronically Signed   By: Lorriane Shire M.D.   On: 01/20/2016 15:37   Dg C-arm Gt 120 Min  Result Date: 01/20/2016 CLINICAL DATA:  Lumbar disc disease. EXAM: DG C-ARM GT 120 MIN; LUMBAR SPINE - COMPLETE 4+ VIEW FLUOROSCOPY TIME:  Fluoroscopy Time:  2 minutes 45 seconds COMPARISON:  MRI dated 11/19/2015 FINDINGS: Patient has undergone interbody and posterior fusion at L3-4 and L4-5. Alignment of the vertebra appears anatomic. Hardware appears in good position in the AP and lateral projections. IMPRESSION: Lumbar fusions performed at L3-4 and L4-5. Electronically Signed   By: Lorriane Shire M.D.   On: 01/20/2016 15:37    Cardiac Studies   Echo pending  Patient Profile     Kristen [name] is a 51 y.o. female with past medical history of GERD on PPI, depression, hiatal hernia, HTN and OSA who has L3-4 and L4-5 spinal stenosis and DJD and underwent lumbar spine surgery on 01/20/2016. She did well post op but developed chest burning very similar to her GERD symptoms. She also developed hypotension after BP meds. Troponin was 0.03 with CPK 1461 and MB 10.9. EKG showed NSR with non-specific anterolateral T wave abnormality. Cardiology was consulted for evaluation of elevated CPK and abnormal EKG.    Assessment & Plan    1. Chest pain -Similar to patient's usual GERD symptoms. Pt had missed several doses of her PPI recently. At home symptoms are controlled with Tums and protonix. -EKG does show diffuse non-specific T wave abnormality, troponin normal.  -Troponin normal.  CPK and MB elevated but total CPK elevated out of proportion to MB and relative index is normal making  cardiac etiology unlikely.  Subsequent trops are normal.  2D echo pending.  Continue on PPI.  Consider oupt nuclear stress test if EF normal on Echo.  2. Hypotension -likely related to a combination of volume depletion  and BP meds. Improved  with IVF resuscitation but now in the 03'T again systolic.  Given her tachycardia and hypotension with recent surgery will check a D-Dimer to rule out PE.  Also, am cortisol mildly reduced so may have some mild adrenal insuff.  Recommend Medicine consult.  3. GERD -Continue PPI  4. HTN -SBP 80's-100 yesterday, 90's-low 100's today -Hold BP meds  5. Anemia -Hgb down from 10.2 to 8.2, may be partly hemodilutional,   Signed, Daune Perch, NP  01/22/2016, 11:05 AM  Pager: [phone]

## 2016-01-22 NOTE — Progress Notes (Signed)
     Subjective: 2 Days Post-Op Procedure(s) (LRB): Removal of rod at right L2 to L3, Transforaminal lumbar interbody fusion right L3-4 and right L4-5 with pedicle screws and rods L2, L3, L4 and L5, local bone graft, allograft and Vivigen (N/A)Awake, alert and oriented x 4. Complains of head ache, does drink 2-3 pepsi beverages per day. Seen by cardiology. Voiding frequently. BP is recovering with volume resuscitation.  Patient reports pain as moderate.    Objective:   VITALS:  Temp:  [98 F (36.7 C)-100.2 F (37.9 C)] 100.2 F (37.9 C) (01/04 0745) Pulse Rate:  [81-137] 115 (01/04 0745) Resp:  [13-26] 17 (01/04 0745) BP: (76-114)/(37-71) 101/62 (01/04 0745) SpO2:  [91 %-100 %] 95 % (01/04 0745)  Neurologically intact ABD soft Neurovascular intact Sensation intact distally Intact pulses distally Dorsiflexion/Plantar flexion intact   LABS  Recent Labs  01/20/16 1503 01/20/16 1604 01/20/16 1751 01/21/16 0751 01/22/16 0519  HGB 10.9* 11.2* 11.5* 10.2* 8.2*  WBC  --   --   --  9.6 8.2  PLT  --   --   --  120* 102*    Recent Labs  01/21/16 0751 01/22/16 0519  NA 142 142  K 4.1 3.8  CL 111 115*  CO2 24 23  BUN 16 15  CREATININE 1.06* 0.96  GLUCOSE 126* 113*   No results for input(s): LABPT, INR in the last 72 hours.   Assessment/Plan: 2 Days Post-Op Procedure(s) (LRB): Removal of rod at right L2 to L3, Transforaminal lumbar interbody fusion right L3-4 and right L4-5 with pedicle screws and rods L2, L3, L4 and L5, local bone graft, allograft and Vivigen (N/A)  Advance diet Up with therapy  Jessy Oto 01/22/2016, 10:47 AM Patient ID: Kristen [name], female   DOB: January 19, 1965, 51 y.o.   MRN: [id number]

## 2016-01-23 ENCOUNTER — Inpatient Hospital Stay (HOSPITAL_COMMUNITY): Payer: BC Managed Care – PPO

## 2016-01-23 DIAGNOSIS — D649 Anemia, unspecified: Secondary | ICD-10-CM

## 2016-01-23 DIAGNOSIS — R0789 Other chest pain: Secondary | ICD-10-CM

## 2016-01-23 DIAGNOSIS — R079 Chest pain, unspecified: Secondary | ICD-10-CM

## 2016-01-23 LAB — TYPE AND SCREEN
ABO/RH(D): B POS
ANTIBODY SCREEN: NEGATIVE
UNIT DIVISION: 0
UNIT DIVISION: 0

## 2016-01-23 LAB — CBC WITH DIFFERENTIAL/PLATELET
BASOS ABS: 0 10*3/uL (ref 0.0–0.1)
BASOS PCT: 0 %
Eosinophils Absolute: 0 10*3/uL (ref 0.0–0.7)
Eosinophils Relative: 0 %
HEMATOCRIT: 28.5 % — AB (ref 36.0–46.0)
HEMOGLOBIN: 9.2 g/dL — AB (ref 12.0–15.0)
LYMPHS PCT: 18 %
Lymphs Abs: 1.7 10*3/uL (ref 0.7–4.0)
MCH: 27.4 pg (ref 26.0–34.0)
MCHC: 32.3 g/dL (ref 30.0–36.0)
MCV: 84.8 fL (ref 78.0–100.0)
MONO ABS: 0.4 10*3/uL (ref 0.1–1.0)
MONOS PCT: 4 %
NEUTROS PCT: 77 %
Neutro Abs: 7.4 10*3/uL (ref 1.7–7.7)
Platelets: 105 10*3/uL — ABNORMAL LOW (ref 150–400)
RBC: 3.36 MIL/uL — ABNORMAL LOW (ref 3.87–5.11)
RDW: 15 % (ref 11.5–15.5)
WBC: 9.6 10*3/uL (ref 4.0–10.5)

## 2016-01-23 LAB — D-DIMER, QUANTITATIVE: D-Dimer, Quant: 2.44 ug/mL-FEU — ABNORMAL HIGH (ref 0.00–0.50)

## 2016-01-23 LAB — COMPREHENSIVE METABOLIC PANEL
ALBUMIN: 2.7 g/dL — AB (ref 3.5–5.0)
ALK PHOS: 71 U/L (ref 38–126)
ALT: 27 U/L (ref 14–54)
AST: 41 U/L (ref 15–41)
Anion gap: 5 (ref 5–15)
BUN: 10 mg/dL (ref 6–20)
CALCIUM: 8.2 mg/dL — AB (ref 8.9–10.3)
CO2: 25 mmol/L (ref 22–32)
Chloride: 112 mmol/L — ABNORMAL HIGH (ref 101–111)
Creatinine, Ser: 0.84 mg/dL (ref 0.44–1.00)
GFR calc Af Amer: >60 mL/min (ref >60)
GLUCOSE: 164 mg/dL — AB (ref 65–99)
POTASSIUM: 3.9 mmol/L (ref 3.5–5.1)
Sodium: 142 mmol/L (ref 135–145)
TOTAL PROTEIN: 5.1 g/dL — AB (ref 6.5–8.1)
Total Bilirubin: 1 mg/dL (ref 0.3–1.2)

## 2016-01-23 LAB — FERRITIN: FERRITIN: 91 ng/mL (ref 11–307)

## 2016-01-23 LAB — IRON AND TIBC
Iron: 38 ug/dL (ref 28–170)
Saturation Ratios: 17 % (ref 10.4–31.8)
TIBC: 223 ug/dL — ABNORMAL LOW (ref 250–450)
UIBC: 185 ug/dL

## 2016-01-23 LAB — RETICULOCYTES
RBC.: 3.27 MIL/uL — AB (ref 3.87–5.11)
RETIC CT PCT: 1.1 % (ref 0.4–3.1)
Retic Count, Absolute: 36 10*3/uL (ref 19.0–186.0)

## 2016-01-23 LAB — LACTATE DEHYDROGENASE: LDH: 220 U/L — AB (ref 98–192)

## 2016-01-23 LAB — VITAMIN B12: VITAMIN B 12: 190 pg/mL (ref 180–914)

## 2016-01-23 LAB — ECHOCARDIOGRAM COMPLETE
Height: 69 in
Weight: 4338.65 oz

## 2016-01-23 LAB — TSH: TSH: 0.195 u[IU]/mL — ABNORMAL LOW (ref 0.350–4.500)

## 2016-01-23 LAB — CK: Total CK: 1548 U/L — ABNORMAL HIGH (ref 38–234)

## 2016-01-23 LAB — MAGNESIUM: MAGNESIUM: 2.1 mg/dL (ref 1.7–2.4)

## 2016-01-23 MED ORDER — IOPAMIDOL (ISOVUE-370) INJECTION 76%
INTRAVENOUS | Status: AC
  Administered 2016-01-23: 100 mL
  Filled 2016-01-23: qty 100

## 2016-01-23 MED ORDER — PREDNISONE 20 MG PO TABS
50.0000 mg | ORAL_TABLET | Freq: Every day | ORAL | Status: DC
  Administered 2016-01-24 – 2016-01-25 (×2): 50 mg via ORAL
  Filled 2016-01-23: qty 1
  Filled 2016-01-23: qty 2

## 2016-01-23 NOTE — Progress Notes (Signed)
Occupational Therapy Treatment Patient Details Name: Kristen [name] MRN: [id number] DOB: Mar 05, 1965 Today's Date: 01/23/2016    History of present illness 50 yo female s/p s/p TLIF L 3-4 L4-5 with pedicle screws with dural tear without CSF leak.  pt with hx of Anxiety, Depression, HTN, RLS, and previous Back Surgery.     OT comments  Pt is able to perform ADLs with min A overall.  She requires min cues for back precautions.  Will follow acutely.    Follow Up Recommendations  No OT follow up    Equipment Recommendations  3 in 1 bedside commode;Other (comment)    Recommendations for Other Services      Precautions / Restrictions Precautions Precautions: Back Precaution Booklet Issued: Yes (comment) Precaution Comments: Pt able to state 3/3 back precautions with min cues  Required Braces or Orthoses: Spinal Brace Spinal Brace: Lumbar corset;Applied in sitting position       Mobility Bed Mobility Overal bed mobility: Needs Assistance Bed Mobility: Rolling;Sidelying to Sit Rolling: Supervision Sidelying to sit: Supervision;HOB elevated       General bed mobility comments: verbal cues for tecnique   Transfers Overall transfer level: Needs assistance Equipment used: Rolling walker (2 wheeled) Transfers: Sit to/from [employer] Sit to Stand: Min guard Stand pivot transfers: Min guard       General transfer comment: verbal cues for hand position     Balance Overall balance assessment: Needs assistance Sitting-balance support: Feet supported Sitting balance-Leahy Scale: Good     Standing balance support: During functional activity;No upper extremity supported Standing balance-Leahy Scale: Fair                     ADL Overall ADL's : Needs assistance/impaired     Grooming: Wash/dry hands;Wash/dry face;Min guard;Standing       Lower Body Bathing: Minimal assistance;Sit to/from stand Lower Body Bathing Details (indicate cue type and  reason): Pt able to cross Rt ankle over knee, but not yet able to cross Lt ankle over knee      Lower Body Dressing: Moderate assistance;Sit to/from stand Lower Body Dressing Details (indicate cue type and reason): Pt able to cross Rt ankle over knee, but not yet able to cross Lt ankle over knee  Toilet Transfer: Min guard;Ambulation;Comfort height toilet;BSC;RW   Toileting- Water quality scientist and Hygiene: Min guard;Sit to/from stand       Functional mobility during ADLs: Min guard;Rolling walker General ADL Comments: Pt reports she was able to cross ankles over knees PTA - aniticpate she may be able to resume this in time, and significant other able to assist her.        Vision                     Perception     Praxis      Cognition   Behavior During Therapy: WFL for tasks assessed/performed Overall Cognitive Status: Within Functional Limits for tasks assessed                       Extremity/Trunk Assessment               Exercises     Shoulder Instructions       General Comments      Pertinent Vitals/ Pain       Pain Assessment: 0-10 Pain Score: 8  Pain Location: back and abdomen Pain Descriptors / Indicators: Discomfort;Operative site guarding Pain Intervention(s): Monitored during  session;Repositioned;RN gave pain meds during session  Home Living                                          Prior Functioning/Environment              Frequency  Min 2X/week        Progress Toward Goals  OT Goals(current goals can now be found in the care plan section)  Progress towards OT goals: Progressing toward goals     Plan Discharge plan remains appropriate    Co-evaluation                 End of Session Equipment Utilized During Treatment: Rolling walker;Back brace   Activity Tolerance Patient tolerated treatment well   Patient Left in chair;with call bell/phone within reach;with nursing/sitter in  room   Nurse Communication Mobility status        Time: 1255-1311 OT Time Calculation (min): 16 min  Charges: OT General Charges $OT Visit: 1 Procedure OT Treatments $Self Care/Home Management : 8-22 mins  Ahmet Schank M 01/23/2016, 1:30 PM

## 2016-01-23 NOTE — Progress Notes (Addendum)
Patient ID: Kristen [name], female   DOB: 30-Jul-1965, 51 y.o.   MRN: [id number]  PROGRESS NOTE    Kristen [name]  FMB:[id number] DOB: 1965-02-05 DOA: 01/20/2016  PCP: Mauricio Po, FNP   Brief Narrative:  51 y.o. female with medical history significant for morbid obesity (BMI 40.1), remote history of surgical fixation for scoliosis, GERD, sleep apnea, hypertension, known spinal stenosis with recent neurogenic claudication symptoms. Patient was brought to the hospital for elective lumbar procedure consisting of removal of right-sided lumbar rod with interbody fusion pedicle screws bone graft and allograft. the attending surgeon reported that the patient had 1500 mL EBL with about 750 mL returned from [facility].  In the postoperative period patient has had chest pain and was evaluated by cardiology and felt to be noncardiac in etiology. Because of history of frequent spinal injections with steroids for her chronic pain ortho was concerned over possible adrenal insufficiency and checked a cortisol level which was low at 5.7. In addition pt was experiencing symptomatic anemia with a baseline hemoglobin between 12 and 13 and subsequent hemoglobin 8.2. She has also developed a new thrombocytopenia postoperatively.   Assessment & Plan:  Chest pain - Per patient, similar to usual GERD symptoms - The 12 lead EKG showed diffuse non-specific T wave abnormality, troponin normal.  - Follow up 2 D ECHO - Per cardio, consider oupt nuclear stress test if EF normal on ECHO  Hypotension - Likely related to a combination of volume depletion and BP meds -  Improved with IVF resuscitation, BP 117/65 this am - Held lisinopril and normodyne  - Also, random morning cortisol low at 5.7 so stress dose steroid given and would need to taper on discharge. If hypotension recurs then cosyntropin test may need to be done. Pt can be on tapering prednisone on discharge (Taper down prednisone starting from 50 mg a day,  taper down by 5 mg a day down to 0 mg)  Postoperative blood loss anemia - Status post 2 units of PRBC transfusion - Hemoglobin is 9.2 this morning  Thrombocytopenia - Likely related to postoperative blood loss anemia - Platelets are stable at 105 - Continue to monitor CBC daily   DVT prophylaxis: SCD's bilaterally  Code Status: full code  Family Communication: no family at the bedside Disposition Plan: per priamry   Consultants:   Cardio  TRH    Subjective: No overnight events.   Objective: Vitals:   01/22/16 2316 01/23/16 0320 01/23/16 0500 01/23/16 0700  BP: 103/67 (!) 93/53 117/65   Pulse: 94 96 87   Resp: 17 19 18    Temp: 98.5 F (36.9 C) 98.5 F (36.9 C)  98 F (36.7 C)  TempSrc: Oral Oral  Oral  SpO2: 93% 96% 94%   Weight:      Height:        Intake/Output Summary (Last 24 hours) at 01/23/16 1025 Last data filed at 01/23/16 0559  Gross per 24 hour  Intake           1886.5 ml  Output             2000 ml  Net           -113.5 ml   Filed Weights   01/20/16 2200  Weight: 123 kg (271 lb 2.7 oz)    Examination:  General exam: Appears calm and comfortable  Respiratory system: Clear to auscultation. Respiratory effort normal. Cardiovascular system: S1 & S2 heard, Rate controlled  Gastrointestinal system: Abdomen  is nondistended, soft and nontender. No organomegaly or masses felt. Normal bowel sounds heard. Central nervous system: Alert and oriented. No focal neurological deficits. Extremities: Symmetric 5 x 5 power. Skin: No rashes, lesions or ulcers Psychiatry: Judgement and insight appear normal. Mood & affect appropriate.   Data Reviewed: I have personally reviewed following labs and imaging studies  CBC:  Recent Labs Lab 01/20/16 1604 01/20/16 1751 01/21/16 0751 01/22/16 0519 01/23/16 0409  WBC  --   --  9.6 8.2 9.6  NEUTROABS  --   --   --  5.4 7.4  HGB 11.2* 11.5* 10.2* 8.2* 9.2*  HCT 33.0* 36.7 31.6* 26.0* 28.5*  MCV  --   --   84.7 86.4 84.8  PLT  --   --  120* 102* 828*   Basic Metabolic Panel:  Recent Labs Lab 01/20/16 1604 01/20/16 1751 01/21/16 0751 01/22/16 0519 01/23/16 0003 01/23/16 0409  NA 142 139 142 142  --  142  K 3.9 4.6 4.1 3.8  --  3.9  CL  --  107 111 115*  --  112*  CO2  --  23 24 23   --  25  GLUCOSE 137* 166* 126* 113*  --  164*  BUN  --  16 16 15   --  10  CREATININE  --  1.23* 1.06* 0.96  --  0.84  CALCIUM  --  7.9* 7.7* 7.5*  --  8.2*  MG  --   --   --   --  2.1  --    GFR: Estimated Creatinine Clearance: 112.4 mL/min (by C-G formula based on SCr of 0.84 mg/dL). Liver Function Tests:  Recent Labs Lab 01/23/16 0409  AST 41  ALT 27  ALKPHOS 71  BILITOT 1.0  PROT 5.1*  ALBUMIN 2.7*   No results for input(s): LIPASE, AMYLASE in the last 168 hours. No results for input(s): AMMONIA in the last 168 hours. Coagulation Profile: No results for input(s): INR, PROTIME in the last 168 hours. Cardiac Enzymes:  Recent Labs Lab 01/21/16 1327 01/21/16 2240 01/22/16 0519 01/23/16 0003  CKTOTAL 1,461*  --   --  1,548*  CKMB 10.9*  --   --   --   TROPONINI 0.03* <0.03 <0.03  <0.03  --    BNP (last 3 results) No results for input(s): PROBNP in the last 8760 hours. HbA1C: No results for input(s): HGBA1C in the last 72 hours. CBG: No results for input(s): GLUCAP in the last 168 hours. Lipid Profile: No results for input(s): CHOL, HDL, LDLCALC, TRIG, CHOLHDL, LDLDIRECT in the last 72 hours. Thyroid Function Tests:  Recent Labs  01/23/16 0003  TSH 0.195*   Anemia Panel:  Recent Labs  01/23/16 0003  VITAMINB12 190  FERRITIN 91  TIBC 223*  IRON 38  RETICCTPCT 1.1   Urine analysis:    Component Value Date/Time   COLORURINE YELLOW 01/22/2016 1257   APPEARANCEUR CLEAR 01/22/2016 1257   LABSPEC 1.014 01/22/2016 1257   PHURINE 5.0 01/22/2016 1257   GLUCOSEU NEGATIVE 01/22/2016 1257   HGBUR NEGATIVE 01/22/2016 1257   BILIRUBINUR NEGATIVE 01/22/2016 1257    KETONESUR NEGATIVE 01/22/2016 1257   PROTEINUR NEGATIVE 01/22/2016 1257   UROBILINOGEN 1.0 01/08/2013 0912   NITRITE NEGATIVE 01/22/2016 1257   LEUKOCYTESUR TRACE (A) 01/22/2016 1257   Sepsis Labs: @LABRCNTIP (procalcitonin:4,lacticidven:4)   MRSA PCR Screening     Status: None   Collection Time: 01/20/16 10:12 PM  Result Value Ref Range Status   MRSA by  PCR NEGATIVE NEGATIVE Final      Radiology Studies: Dg Lumbar Spine Complete Result Date: 01/20/2016 Lumbar fusions performed at L3-4 and L4-5. Electronically Signed   By: Lorriane Shire M.D.   On: 01/20/2016 15:37   Dg C-arm Gt 120 Min Result Date: 01/20/2016  Lumbar fusions performed at L3-4 and L4-5. Electronically Signed   By: Lorriane Shire M.D.   On: 01/20/2016 15:37     Scheduled Meds: . calcium carbonate  1 tablet Oral TID WC  . docusate sodium  100 mg Oral BID  . gabapentin  300 mg Oral TID  . iopamidol      . pantoprazole  40 mg Oral Daily  . sodium chloride flush  3 mL Intravenous Q12H   Continuous Infusions: . sodium chloride       LOS: 3 days    Time spent: 15 minutes  Greater than 50% of the time spent on counseling and coordinating the care.   Leisa Lenz, MD [facility] Pager [phone]  If 7PM-7AM, please contact night-coverage [email or web address] Password [email or web address] 01/23/2016, 10:25 AM

## 2016-01-23 NOTE — Progress Notes (Signed)
Physical Therapy Treatment Patient Details Name: AVIYANA SONNTAG MRN: [id number] DOB: [birth year] Today's Date: 01/23/2016    History of Present Illness 51 yo female s/p s/p TLIF L 3-4 L4-5 with pedicle screws with dural tear without CSF leak.  pt with hx of Anxiety, Depression, HTN, RLS, and previous Back Surgery.      PT Comments    Pt eager for ambulation and able to ambulate entire unit today.  Pt moves slowly, but is doing well with steadiness and maintaining back precautions.  Will continue to follow while on acute.    Follow Up Recommendations  Home health PT;Supervision/Assistance - 24 hour     Equipment Recommendations  Rolling walker with 5" wheels;3in1 (PT)    Recommendations for Other Services       Precautions / Restrictions Precautions Precautions: Back Precaution Booklet Issued: Yes (comment) Precaution Comments: Pt able to state 3/3 back precautions with min cues  Required Braces or Orthoses: Spinal Brace Spinal Brace: Lumbar corset;Applied in sitting position Restrictions Weight Bearing Restrictions: No    Mobility  Bed Mobility Overal bed mobility: Needs Assistance Bed Mobility: Rolling;Sidelying to Sit Rolling: Supervision Sidelying to sit: Supervision;HOB elevated       General bed mobility comments: pt sitting in chair  Transfers Overall transfer level: Needs assistance Equipment used: Rolling walker (2 wheeled) Transfers: Sit to/from Stand Sit to Stand: Min guard Stand pivot transfers: Min guard       General transfer comment: verbal cues for hand position   Ambulation/Gait Ambulation/Gait assistance: Min guard Ambulation Distance (Feet): 300 Feet Assistive device: Rolling walker (2 wheeled) Gait Pattern/deviations: Step-through pattern;Decreased stride length     General Gait Details: pt moves slowly, but steadily.  pt denies dizziness.     Stairs            Wheelchair Mobility    Modified Rankin (Stroke Patients Only)       Balance Overall balance assessment: Needs assistance Sitting-balance support: Feet supported;No upper extremity supported Sitting balance-Leahy Scale: Good     Standing balance support: Single extremity supported;Bilateral upper extremity supported;No upper extremity supported;During functional activity Standing balance-Leahy Scale: Fair                      Cognition Arousal/Alertness: Awake/alert Behavior During Therapy: WFL for tasks assessed/performed Overall Cognitive Status: Within Functional Limits for tasks assessed                      Exercises      General Comments General comments (skin integrity, edema, etc.): Pt able to don brace with supervision for correct placement       Pertinent Vitals/Pain Pain Assessment: 0-10 Pain Score: 6  Pain Location: back and abdomen Pain Descriptors / Indicators: Discomfort;Operative site guarding Pain Intervention(s): Monitored during session;Premedicated before session;Repositioned    Home Living                      Prior Function            PT Goals (current goals can now be found in the care plan section) Acute Rehab PT Goals Patient Stated Goal: To get home PT Goal Formulation: With patient Time For Goal Achievement: 01/29/16 Potential to Achieve Goals: Good Progress towards PT goals: Progressing toward goals    Frequency    Min 5X/week      PT Plan Current plan remains appropriate    Co-evaluation  End of Session Equipment Utilized During Treatment: Gait belt;Back brace Activity Tolerance: Patient tolerated treatment well Patient left: in chair;with call bell/phone within reach     Time: 1349-1409 PT Time Calculation (min) (ACUTE ONLY): 20 min  Charges:  $Gait Training: 8-22 mins                    G CodesCatarina Hartshorn, [place]  [phone] 01/23/2016, 3:12 PM

## 2016-01-23 NOTE — Progress Notes (Signed)
  Echocardiogram 2D Echocardiogram has been performed.  Kristen [name] 01/23/2016, 9:38 AM

## 2016-01-23 NOTE — Progress Notes (Signed)
Patient Name: NONA GRACEY Date of Encounter: 01/23/2016  Primary Cardiologist: [facility] Problem List     Principal Problem:   Orthostatic hypotension Active Problems:   Postoperative anemia   Morbid obesity with BMI of 40.0-44.9, adult (HCC)   OSA (obstructive sleep apnea)   Spinal stenosis of lumbar region with neurogenic claudication   Chest pain   Essential hypertension   Hypocalcemia   Thrombocytopenia (HCC)   Chronic diarrhea     Subjective   Chest discomfort much improved today.  Sitting up in chair.  Tachycardic at 118bpm in sinus  Inpatient Medications    Scheduled Meds: . calcium carbonate  1 tablet Oral TID WC  . docusate sodium  100 mg Oral BID  . gabapentin  300 mg Oral TID  . pantoprazole  40 mg Oral Daily  . sodium chloride flush  3 mL Intravenous Q12H   Continuous Infusions: . sodium chloride     PRN Meds: acetaminophen **OR** acetaminophen, alum & mag hydroxide-simeth, bisacodyl, Bismuth Subsalicylate, HYDROcodone-acetaminophen, loperamide, menthol-cetylpyridinium **OR** phenol, methocarbamol **OR** methocarbamol (ROBAXIN)  IV, morphine injection, ondansetron (ZOFRAN) IV, oxyCODONE-acetaminophen, polyethylene glycol, sodium chloride flush, sodium phosphate, tiZANidine   Vital Signs    Vitals:   01/22/16 2316 01/23/16 0320 01/23/16 0500 01/23/16 0700  BP: 103/67 (!) 93/53 117/65   Pulse: 94 96 87   Resp: 17 19 18    Temp: 98.5 F (36.9 C) 98.5 F (36.9 C)  98 F (36.7 C)  TempSrc: Oral Oral  Oral  SpO2: 93% 96% 94%   Weight:      Height:        Intake/Output Summary (Last 24 hours) at 01/23/16 0819 Last data filed at 01/23/16 0559  Gross per 24 hour  Intake           1886.5 ml  Output             2000 ml  Net           -113.5 ml   Filed Weights   01/20/16 2200  Weight: 271 lb 2.7 oz (123 kg)    Physical Exam   GEN: Well nourished, well developed, in no acute distress.  HEENT: Grossly normal.  Neck: Supple,  no JVD, carotid bruits, or masses. Cardiac: RRR, no murmurs, rubs, or gallops. No clubbing, cyanosis, edema.  Radials/DP/PT 2+ and equal bilaterally.  Respiratory:  Respirations regular and unlabored, clear to auscultation bilaterally. GI: Soft, nontender, nondistended, BS + x 4. MS: no deformity or atrophy. Skin: warm and dry, no rash. Neuro:  Strength and sensation are intact. Psych: AAOx3.  Normal affect.  Labs    CBC  Recent Labs  01/22/16 0519 01/23/16 0409  WBC 8.2 9.6  NEUTROABS 5.4 7.4  HGB 8.2* 9.2*  HCT 26.0* 28.5*  MCV 86.4 84.8  PLT 102* 010*   Basic Metabolic Panel  Recent Labs  01/22/16 0519 01/23/16 0003 01/23/16 0409  NA 142  --  142  K 3.8  --  3.9  CL 115*  --  112*  CO2 23  --  25  GLUCOSE 113*  --  164*  BUN 15  --  10  CREATININE 0.96  --  0.84  CALCIUM 7.5*  --  8.2*  MG  --  2.1  --    Liver Function Tests  Recent Labs  01/23/16 0409  AST 41  ALT 27  ALKPHOS 71  BILITOT 1.0  PROT 5.1*  ALBUMIN 2.7*  No results for input(s): LIPASE, AMYLASE in the last 72 hours. Cardiac Enzymes  Recent Labs  01/21/16 1327 01/21/16 2240 01/22/16 0519 01/23/16 0003  CKTOTAL 1,461*  --   --  1,548*  CKMB 10.9*  --   --   --   TROPONINI 0.03* <0.03 <0.03  <0.03  --    BNP Invalid input(s): POCBNP D-Dimer  Recent Labs  01/23/16 0003  DDIMER 2.44*   Hemoglobin A1C No results for input(s): HGBA1C in the last 72 hours. Fasting Lipid Panel No results for input(s): CHOL, HDL, LDLCALC, TRIG, CHOLHDL, LDLDIRECT in the last 72 hours. Thyroid Function Tests  Recent Labs  01/23/16 0003  TSH 0.195*    Telemetry    - Personally Reviewed  ECG    01/21/16- Normal sinus rhythm at 84 bpm, LVH and non-specific anterolateral T wave abnormality - Personally Reviewed  Radiology    No results found.  Cardiac Studies   Echo pending  Patient Profile     NIKIYAH FACKLER is a 50 y.o. female with past medical history of GERD on PPI,  depression, hiatal hernia, HTN and OSA who has L3-4 and L4-5 spinal stenosis and DJD and underwent lumbar spine surgery on 01/20/2016. She did well post op but developed chest burning very similar to her GERD symptoms. She also developed hypotension after BP meds. Troponin was 0.03 with CPK 1461 and MB 10.9. EKG showed NSR with non-specific anterolateral T wave abnormality. Cardiology was consulted for evaluation of elevated CPK and abnormal EKG.    Assessment & Plan    1. Chest pain -Similar to patient's usual GERD symptoms. Pt had missed several doses of her PPI recently. At home symptoms are controlled with Tums and protonix. -EKG does show diffuse non-specific T wave abnormality, troponin normal.  -Troponin normal.  CPK and MB elevated but total CPK elevated out of proportion to MB and relative index is normal making cardiac etiology unlikely.  Subsequent trops are normal.  2D echo pending.  Continue on PPI.  Consider oupt nuclear stress test if EF normal on Echo.  2. Hypotension -likely related to a combination of volume depletion and BP meds. Improved  with IVF resuscitation.  Given her tachycardia and hypotension with recent surgery  D-Dimer checked and elevated.  Will get Chest CT angio to rule out PE.   Also, am cortisol mildly reduced so may have some mild adrenal insuff. Appreciate Medicine consult.   3. GERD -Continue PPI  4. HTN -SBP 80's-100 yesterday, 90's-low 100's today -Continue to hold BP meds  Signed, Fransico Him, MD  01/23/2016, 8:19 AM  Pager: [phone]

## 2016-01-23 NOTE — Progress Notes (Signed)
Patient ID: Kristen [name], female   DOB: [birth year], 51 y.o.   MRN: [id number] Seen this evening, no assessment of Echo per cardiology as yet, SBP is improved post transfusion x 2 units but not hypertensive or  Requiring any antihypertensive agents. Plan to allow her to go to med-surg floor in AM if she remains stable. May be [name] for discharge Sunday or Monday.

## 2016-01-23 NOTE — Progress Notes (Signed)
     Subjective: 3 Days Post-Op Procedure(s) (LRB): Removal of rod at right L2 to L3, Transforaminal lumbar interbody fusion right L3-4 and right L4-5 with pedicle screws and rods L2, L3, L4 and L5, local bone graft, allograft and Vivigen (N/A)Awake, alert and oriented x 4. Standing and walking with PT. Received 2 units PRBCs for blood and volume replacement post surgical blood loss than is symptomatic. SBP remains decreased.  Patient reports pain as moderate.    Objective:   VITALS:  Temp:  [98 F (36.7 C)-99.4 F (37.4 C)] 98.2 F (36.8 C) (01/05 1610) Pulse Rate:  [71-118] 71 (01/05 1610) Resp:  [16-20] 16 (01/05 1610) BP: (93-137)/(53-84) 116/77 (01/05 1610) SpO2:  [93 %-98 %] 98 % (01/05 1610)  Neurologically intact ABD soft Neurovascular intact Sensation intact distally Intact pulses distally Dorsiflexion/Plantar flexion intact Incision: dressing C/D/I and moderate drainage   LABS  Recent Labs  01/20/16 1751  01/21/16 0751 01/22/16 0519 01/23/16 0409  HGB 11.5*  --  10.2* 8.2* 9.2*  WBC  --   < > 9.6 8.2 9.6  PLT  --   < > 120* 102* 105*  < > = values in this interval not displayed.  Recent Labs  01/22/16 0519 01/23/16 0409  NA 142 142  K 3.8 3.9  CL 115* 112*  CO2 23 25  BUN 15 10  CREATININE 0.96 0.84  GLUCOSE 113* 164*   No results for input(s): LABPT, INR in the last 72 hours.   Assessment/Plan: 3 Days Post-Op Procedure(s) (LRB): Removal of rod at right L2 to L3, Transforaminal lumbar interbody fusion right L3-4 and right L4-5 with pedicle screws and rods L2, L3, L4 and L5, local bone graft, allograft and Vivigen (N/A)  [facility] diet Up with therapy  Plan to AM transfer to [facility] floor [place] or 5 C pending internal medicine and cardiology assessment of the Echocardiogram. EF 60-65% Some mitral regurgitation and relaxation changes c/w  Some mild dysfunction will deferr to cardiology for their recommendations ?betablocker. Continue with  PT/OT    Jessy Oto 01/23/2016, 5:22 PM Patient ID: Kristen [name], female   DOB: Jul 17, 1965, 51 y.o.   MRN: [id number]

## 2016-01-24 DIAGNOSIS — R072 Precordial pain: Secondary | ICD-10-CM

## 2016-01-24 LAB — VITAMIN D 25 HYDROXY (VIT D DEFICIENCY, FRACTURES): VIT D 25 HYDROXY: 12.9 ng/mL — AB (ref 30.0–100.0)

## 2016-01-24 LAB — CBC
HCT: 30.2 % — ABNORMAL LOW (ref 36.0–46.0)
HEMOGLOBIN: 9.6 g/dL — AB (ref 12.0–15.0)
MCH: 27.2 pg (ref 26.0–34.0)
MCHC: 31.8 g/dL (ref 30.0–36.0)
MCV: 85.6 fL (ref 78.0–100.0)
PLATELETS: 150 10*3/uL (ref 150–400)
RBC: 3.53 MIL/uL — ABNORMAL LOW (ref 3.87–5.11)
RDW: 14.7 % (ref 11.5–15.5)
WBC: 8.5 10*3/uL (ref 4.0–10.5)

## 2016-01-24 LAB — HAPTOGLOBIN: Haptoglobin: 154 mg/dL (ref 34–200)

## 2016-01-24 NOTE — Progress Notes (Addendum)
Physical Therapy Treatment Patient Details Name: Kristen [name] MRN: [id number] DOB: [birth year] Today's Date: 01/24/2016    History of Present Illness 51 yo female s/p s/p TLIF L 3-4 L4-5 with pedicle screws with dural tear without CSF leak.  pt with hx of Anxiety, Depression, HTN, RLS, and previous Back Surgery.      PT Comments    Pt making steady progress. Used pt's new walker that was delivered earlier in the week. Very difficult to close the walker due to push button on left side very difficult to operate and required significant force with two hands to operate. Notified case manager and she will notify [facility] to swap out walker.  Follow Up Recommendations  Home health PT;Supervision/Assistance - 24 hour     Equipment Recommendations  Rolling walker with 5" wheels;3in1 (PT)    Recommendations for Other Services       Precautions / Restrictions Precautions Precautions: Back Precaution Comments: Can get up to bathroom without brace Required Braces or Orthoses: Spinal Brace Spinal Brace: Lumbar corset;Applied in sitting position Restrictions Weight Bearing Restrictions: No    Mobility  Bed Mobility               General bed mobility comments: pt sitting in chair  Transfers Overall transfer level: Needs assistance Equipment used: Rolling walker (2 wheeled) Transfers: Sit to/from Stand Sit to Stand: Supervision         General transfer comment: No cues needed  Ambulation/Gait Ambulation/Gait assistance: Supervision Ambulation Distance (Feet): 160 Feet Assistive device: Rolling walker (2 wheeled) Gait Pattern/deviations: Step-through pattern;Decreased stride length Gait velocity: decr Gait velocity interpretation: Below normal speed for age/gender General Gait Details: Slow measured movements but no loss of balance or instability   Stairs            Wheelchair Mobility    Modified Rankin (Stroke Patients Only)       Balance Overall  balance assessment: Needs assistance Sitting-balance support: Feet supported;No upper extremity supported Sitting balance-Leahy Scale: Good     Standing balance support: No upper extremity supported Standing balance-Leahy Scale: Fair                      Cognition Arousal/Alertness: Awake/alert Behavior During Therapy: WFL for tasks assessed/performed Overall Cognitive Status: Within Functional Limits for tasks assessed                      Exercises      General Comments        Pertinent Vitals/Pain Pain Assessment: Faces Faces Pain Scale: Hurts little more Pain Location: rt thigh and leg Pain Descriptors / Indicators: Aching Pain Intervention(s): Limited activity within patient's tolerance;Monitored during session    Home Living                      Prior Function            PT Goals (current goals can now be found in the care plan section) Progress towards PT goals: Progressing toward goals    Frequency    Min 5X/week      PT Plan Current plan remains appropriate    Co-evaluation             End of Session Equipment Utilized During Treatment: Back brace Activity Tolerance: Patient tolerated treatment well Patient left: in chair;with call bell/phone within reach     Time: 1157-1211 PT Time Calculation (min) (ACUTE ONLY): 14 min  Charges:  $Gait Training: 8-22 mins                    G Codes:      Shary Decamp Maycok 02-11-16, 4:11 PM [employer] [place]

## 2016-01-24 NOTE — Progress Notes (Signed)
RT in to check on when pt would like to go on CPAP tonight.  Pt states she does not want to wear CPAP tonight.  Pt encouraged to let RN know if she changes her mind.

## 2016-01-24 NOTE — Progress Notes (Signed)
CM received call form PT bc pt's RW from [facility] is broken and they are requesting they swap it out; CM has notified [facility] DME rep, Reggie.  No  Other CM needs were communicated.

## 2016-01-24 NOTE — Progress Notes (Signed)
Patient ID: Kristen [name], female   DOB: Aug 14, 1965, 51 y.o.   MRN: [id number]  PROGRESS NOTE    Kristen [name]  AUQ:[id number] DOB: 1965-12-01 DOA: 01/20/2016  PCP: Mauricio Po, FNP   Brief Narrative:  51 y.o. female with medical history significant for morbid obesity (BMI 40.1), remote history of surgical fixation for scoliosis, GERD, sleep apnea, hypertension, known spinal stenosis with recent neurogenic claudication symptoms. Patient was brought to the hospital for elective lumbar procedure consisting of removal of right-sided lumbar rod with interbody fusion pedicle screws bone graft and allograft. the attending surgeon reported that the patient had 1500 mL EBL with about 750 mL returned from [facility].  In the postoperative period patient has had chest pain and was evaluated by cardiology and felt to be noncardiac in etiology. Because of history of frequent spinal injections with steroids for her chronic pain ortho was concerned over possible adrenal insufficiency and checked a cortisol level which was low at 5.7. In addition pt was experiencing symptomatic anemia with a baseline hemoglobin between 12 and 13 and subsequent hemoglobin 8.2. She has also developed a new thrombocytopenia postoperatively.   Assessment & Plan:  Chest pain - Per patient, similar to usual GERD symptoms - The 12 lead EKG showed diffuse non-specific T wave abnormality, troponin normal.  - ECHO showed EF 60% and grade 1 diastolic dysfunction  - Follow up on cardio recommendations  Hypotension - Likely related to a combination of volume depletion and BP meds -  Improved with IVF resuscitation - Held lisinopril and normodyne  - Also, random morning cortisol low at 5.7 so stress dose steroid given and would need to taper on discharge. If hypotension recurs then cosyntropin test may need to be done. Pt can be on tapering prednisone on discharge (Taper down prednisone starting from 50 mg a day, taper down by  5 mg a day down to 0 mg)  Postoperative blood loss anemia - Status post 2 units of PRBC transfusion - Follow up CBC this am  Thrombocytopenia - Likely related to postoperative blood loss anemia - Platelets are stable at 105 - CBC this am is pending    DVT prophylaxis: SCD's bilaterally  Code Status: full code  Family Communication: no family at the bedside Disposition Plan: per priamry   Consultants:   Cardio  TRH    Subjective: No overnight events.   Objective: Vitals:   01/23/16 1944 01/23/16 2358 01/24/16 0403 01/24/16 0759  BP: 120/80 104/71 110/70 117/76  Pulse: 92 73 81 67  Resp: 17 16 18 16   Temp: 98.1 F (36.7 C) 98.8 F (37.1 C) 98.5 F (36.9 C) 97.9 F (36.6 C)  TempSrc: Oral Oral Oral Oral  SpO2: 96% 99% 99% 97%  Weight:      Height:        Intake/Output Summary (Last 24 hours) at 01/24/16 1041 Last data filed at 01/24/16 1000  Gross per 24 hour  Intake             1080 ml  Output              800 ml  Net              280 ml   Filed Weights   01/20/16 2200  Weight: 123 kg (271 lb 2.7 oz)    Examination:  General exam: No distress  Respiratory system: Respiratory effort normal. Cardiovascular system: S1 & S2 heard, Rate controlled  Gastrointestinal system: () BS, non  tender  Central nervous system: No focal neurological deficits. Extremities: No edema, palpable pulses  Skin: skin is warm dry  Psychiatry: Normal mood and behavior   Data Reviewed: I have personally reviewed following labs and imaging studies  CBC:  Recent Labs Lab 01/20/16 1604 01/20/16 1751 01/21/16 0751 01/22/16 0519 01/23/16 0409  WBC  --   --  9.6 8.2 9.6  NEUTROABS  --   --   --  5.4 7.4  HGB 11.2* 11.5* 10.2* 8.2* 9.2*  HCT 33.0* 36.7 31.6* 26.0* 28.5*  MCV  --   --  84.7 86.4 84.8  PLT  --   --  120* 102* 035*   Basic Metabolic Panel:  Recent Labs Lab 01/20/16 1604 01/20/16 1751 01/21/16 0751 01/22/16 0519 01/23/16 0003 01/23/16 0409  NA  142 139 142 142  --  142  K 3.9 4.6 4.1 3.8  --  3.9  CL  --  107 111 115*  --  112*  CO2  --  23 24 23   --  25  GLUCOSE 137* 166* 126* 113*  --  164*  BUN  --  16 16 15   --  10  CREATININE  --  1.23* 1.06* 0.96  --  0.84  CALCIUM  --  7.9* 7.7* 7.5*  --  8.2*  MG  --   --   --   --  2.1  --    GFR: Estimated Creatinine Clearance: 112.4 mL/min (by C-G formula based on SCr of 0.84 mg/dL). Liver Function Tests:  Recent Labs Lab 01/23/16 0409  AST 41  ALT 27  ALKPHOS 71  BILITOT 1.0  PROT 5.1*  ALBUMIN 2.7*   No results for input(s): LIPASE, AMYLASE in the last 168 hours. No results for input(s): AMMONIA in the last 168 hours. Coagulation Profile: No results for input(s): INR, PROTIME in the last 168 hours. Cardiac Enzymes:  Recent Labs Lab 01/21/16 1327 01/21/16 2240 01/22/16 0519 01/23/16 0003  CKTOTAL 1,461*  --   --  1,548*  CKMB 10.9*  --   --   --   TROPONINI 0.03* <0.03 <0.03  <0.03  --    BNP (last 3 results) No results for input(s): PROBNP in the last 8760 hours. HbA1C: No results for input(s): HGBA1C in the last 72 hours. CBG: No results for input(s): GLUCAP in the last 168 hours. Lipid Profile: No results for input(s): CHOL, HDL, LDLCALC, TRIG, CHOLHDL, LDLDIRECT in the last 72 hours. Thyroid Function Tests:  Recent Labs  01/23/16 0003  TSH 0.195*   Anemia Panel:  Recent Labs  01/23/16 0003  VITAMINB12 190  FERRITIN 91  TIBC 223*  IRON 38  RETICCTPCT 1.1   Urine analysis:    Component Value Date/Time   COLORURINE YELLOW 01/22/2016 1257   APPEARANCEUR CLEAR 01/22/2016 1257   LABSPEC 1.014 01/22/2016 1257   PHURINE 5.0 01/22/2016 1257   GLUCOSEU NEGATIVE 01/22/2016 1257   HGBUR NEGATIVE 01/22/2016 1257   BILIRUBINUR NEGATIVE 01/22/2016 1257   KETONESUR NEGATIVE 01/22/2016 1257   PROTEINUR NEGATIVE 01/22/2016 1257   UROBILINOGEN 1.0 01/08/2013 0912   NITRITE NEGATIVE 01/22/2016 1257   LEUKOCYTESUR TRACE (A) 01/22/2016 1257    Sepsis Labs: @LABRCNTIP (procalcitonin:4,lacticidven:4)   MRSA PCR Screening     Status: None   Collection Time: 01/20/16 10:12 PM  Result Value Ref Range Status   MRSA by PCR NEGATIVE NEGATIVE Final      Radiology Studies: Dg Lumbar Spine Complete Result Date: 01/20/2016 Lumbar fusions  performed at L3-4 and L4-5. Electronically Signed   By: Lorriane Shire M.D.   On: 01/20/2016 15:37   Dg C-arm Gt 120 Min Result Date: 01/20/2016  Lumbar fusions performed at L3-4 and L4-5. Electronically Signed   By: Lorriane Shire M.D.   On: 01/20/2016 15:37     Scheduled Meds: . calcium carbonate  1 tablet Oral TID WC  . docusate sodium  100 mg Oral BID  . gabapentin  300 mg Oral TID  . pantoprazole  40 mg Oral Daily  . predniSONE  50 mg Oral Q breakfast  . sodium chloride flush  3 mL Intravenous Q12H   Continuous Infusions: . sodium chloride       LOS: 4 days    Time spent: 15 minutes  Greater than 50% of the time spent on counseling and coordinating the care.   Leisa Lenz, MD [facility] Pager [phone]  If 7PM-7AM, please contact night-coverage [email or web address] Password [email or web address] 01/24/2016, 10:41 AM

## 2016-01-24 NOTE — Progress Notes (Signed)
Attempted report, nurse at lunch and will call back. Consuelo Pandy RN

## 2016-01-24 NOTE — Progress Notes (Signed)
OT Cancellation Note  Patient Details Name: Kristen [name] MRN: [id number] DOB: April 27, 1965   Cancelled Treatment:    Reason Eval/Treat Not Completed: Other (comment). PT getting ready to work with pt, will see at later time.  Almon Register [phone] 01/24/2016, 12:07 PM

## 2016-01-24 NOTE — Progress Notes (Signed)
Patient Name: Kristen [name] Date of Encounter: 01/24/2016  Primary Cardiologist: [facility] Problem List     Principal Problem:   Orthostatic hypotension Active Problems:   Postoperative anemia   Morbid obesity with BMI of 40.0-44.9, adult (HCC)   OSA (obstructive sleep apnea)   Spinal stenosis of lumbar region with neurogenic claudication   Chest pain   Essential hypertension   Hypocalcemia   Thrombocytopenia (HCC)   Chronic diarrhea     Subjective   No chest pain or dyspnea  Inpatient Medications    Scheduled Meds: . calcium carbonate  1 tablet Oral TID WC  . docusate sodium  100 mg Oral BID  . gabapentin  300 mg Oral TID  . pantoprazole  40 mg Oral Daily  . predniSONE  50 mg Oral Q breakfast  . sodium chloride flush  3 mL Intravenous Q12H   Continuous Infusions: . sodium chloride     PRN Meds: acetaminophen **OR** acetaminophen, alum & mag hydroxide-simeth, bisacodyl, Bismuth Subsalicylate, HYDROcodone-acetaminophen, loperamide, menthol-cetylpyridinium **OR** phenol, methocarbamol **OR** methocarbamol (ROBAXIN)  IV, morphine injection, ondansetron (ZOFRAN) IV, oxyCODONE-acetaminophen, polyethylene glycol, sodium chloride flush, sodium phosphate, tiZANidine   Vital Signs    Vitals:   01/23/16 1944 01/23/16 2358 01/24/16 0403 01/24/16 0759  BP: 120/80 104/71 110/70 117/76  Pulse: 92 73 81 67  Resp: 17 16 18 16   Temp: 98.1 F (36.7 C) 98.8 F (37.1 C) 98.5 F (36.9 C) 97.9 F (36.6 C)  TempSrc: Oral Oral Oral Oral  SpO2: 96% 99% 99% 97%  Weight:      Height:        Intake/Output Summary (Last 24 hours) at 01/24/16 1101 Last data filed at 01/24/16 1000  Gross per 24 hour  Intake             1080 ml  Output              800 ml  Net              280 ml   Filed Weights   01/20/16 2200  Weight: 271 lb 2.7 oz (123 kg)    Physical Exam   GEN: Well nourished, obese in no acute distress.  HEENT: Grossly normal.  Neck:  Supple Cardiac: RRR Respiratory:  CTA GI: Soft, nontender, nondistended. MS: no deformity or atrophy. Skin: warm and dry, no rash. Neuro:  Strength and sensation are intact. Psych: AAOx3.  Normal affect.  Labs    CBC  Recent Labs  01/22/16 0519 01/23/16 0409  WBC 8.2 9.6  NEUTROABS 5.4 7.4  HGB 8.2* 9.2*  HCT 26.0* 28.5*  MCV 86.4 84.8  PLT 102* 010*   Basic Metabolic Panel  Recent Labs  01/22/16 0519 01/23/16 0003 01/23/16 0409  NA 142  --  142  K 3.8  --  3.9  CL 115*  --  112*  CO2 23  --  25  GLUCOSE 113*  --  164*  BUN 15  --  10  CREATININE 0.96  --  0.84  CALCIUM 7.5*  --  8.2*  MG  --  2.1  --    Liver Function Tests  Recent Labs  01/23/16 0409  AST 41  ALT 27  ALKPHOS 71  BILITOT 1.0  PROT 5.1*  ALBUMIN 2.7*  Cardiac Enzymes  Recent Labs  01/21/16 1327 01/21/16 2240 01/22/16 0519 01/23/16 0003  CKTOTAL 1,461*  --   --  1,548*  CKMB 10.9*  --   --   --  TROPONINI 0.03* <0.03 <0.03  <0.03  --    BNP Invalid input(s): POCBNP D-Dimer  Recent Labs  01/23/16 0003  DDIMER 2.44*   Thyroid Function Tests  Recent Labs  01/23/16 0003  TSH 0.195*    Telemetry    Sinus- Personally Reviewed    Radiology    Ct Angio Chest Pe W Or Wo Contrast  Result Date: 01/23/2016 CLINICAL DATA:  Recent lumbar fusion surgery. Chest pain and elevated D-dimer postoperatively. Tachycardia and hypotension. EXAM: CT ANGIOGRAPHY CHEST WITH CONTRAST TECHNIQUE: Multidetector CT imaging of the chest was performed using the standard protocol during bolus administration of intravenous contrast. Multiplanar CT image reconstructions and MIPs were obtained to evaluate the vascular anatomy. CONTRAST:  100 mL Isovue 370 IV COMPARISON:  None. FINDINGS: Cardiovascular: Satisfactory opacification of the pulmonary arteries to the segmental level. No evidence of pulmonary embolism. Heart is top-normal in size. No pericardial effusion. Mediastinum/Nodes: No evidence  of mediastinal masses. No mediastinal, hilar or axillary lymphadenopathy identified. Lungs/Pleura: Mild bilateral pulmonary scarring. There is no evidence of pulmonary edema, consolidation, pneumothorax, nodule or pleural fluid. Upper Abdomen: Visualized upper abdomen is unremarkable. Musculoskeletal: The bony thorax demonstrates degenerative disc disease the thoracic spine. Rightward convex scoliosis present. No evidence of fracture or bony lesion. Review of the MIP images confirms the above findings. IMPRESSION: No evidence of pulmonary embolism or other acute findings. The heart is top-normal in size. Electronically Signed   By: Aletta Edouard M.D.   On: 01/23/2016 11:45    Patient Profile     Kristen [name] is a 51 y.o. female with past medical history of GERD on PPI, depression, hiatal hernia, HTN and OSA who has L3-4 and L4-5 spinal stenosis and DJD and underwent lumbar spine surgery on 01/20/2016. She did well post op but developed chest burning very similar to her GERD symptoms. She also developed hypotension after BP meds. Troponin was 0.03 with CPK 1461 and MB 10.9. EKG showed NSR with non-specific anterolateral T wave abnormality. Cardiology was consulted for evaluation of elevated CPK and abnormal EKG.    Assessment & Plan    1. Chest pain -No further symptoms. Similar to patient's usual GERD symptoms. Continue protonix. Enzymes negative. Echo shows normal LV function. FU Dr Radford Pax following DC. May consider functional study at that time.  2. Hypotension -CTA shows no PE. BP improved following DC BP meds; would continue off BP meds and follow. Low cortisol per primary care.  3. GERD -Continue PPI  We will sign off; please call with questions; FU Dr Radford Pax following DC.  Signed, Kirk Ruths, MD  01/24/2016, 11:01 AM  Pager: [phone]

## 2016-01-24 NOTE — Progress Notes (Signed)
Patient ID: Kristen [name], female   DOB: 02-Dec-1965, 51 y.o.   MRN: [id number] Patient is asymptomatic she has no complaints she is sitting up in a chair. Blood pressure and vital signs stable. We'll transfer patient to 5 N. and anticipate discharge to home possibly on Monday.

## 2016-01-25 MED ORDER — PREDNISONE 5 MG PO TABS
45.0000 mg | ORAL_TABLET | Freq: Every day | ORAL | Status: DC
  Administered 2016-01-26: 45 mg via ORAL
  Filled 2016-01-25: qty 4

## 2016-01-25 NOTE — Progress Notes (Signed)
Patient ID: Kristen [name], female   DOB: 1965/08/13, 51 y.o.   MRN: [id number] Patient is independently ambulating with her walker. Anticipate discharge to home on Monday. Patient has no complaints at this time.

## 2016-01-25 NOTE — Progress Notes (Signed)
Patient ID: Kristen [name], female   DOB: 1965/05/21, 51 y.o.   MRN: [id number]  PROGRESS NOTE    Kristen [name]  NUU:[id number] DOB: 08-17-1965 DOA: 01/20/2016  PCP: Mauricio Po, FNP   Brief Narrative:  51 y.o. female with medical history significant for morbid obesity (BMI 40.1), remote history of surgical fixation for scoliosis, GERD, sleep apnea, hypertension, known spinal stenosis with recent neurogenic claudication symptoms. Patient was brought to the hospital for elective lumbar procedure consisting of removal of right-sided lumbar rod with interbody fusion pedicle screws bone graft and allograft. the attending surgeon reported that the patient had 1500 mL EBL with about 750 mL returned from [facility].  In the postoperative period patient has had chest pain and was evaluated by cardiology and felt to be noncardiac in etiology. Because of history of frequent spinal injections with steroids for her chronic pain ortho was concerned over possible adrenal insufficiency and checked a cortisol level which was low at 5.7. In addition pt was experiencing symptomatic anemia with a baseline hemoglobin between 12 and 13 and subsequent hemoglobin 8.2. She has also developed a new thrombocytopenia postoperatively.   Assessment & Plan:  Chest pain - Per patient, similar to usual GERD symptoms - The 12 lead EKG showed diffuse non-specific T wave abnormality, troponin normal.  - ECHO showed EF 60% and grade 1 diastolic dysfunction  - Follow up on cardio recommendations  Hypotension - Likely related to a combination of volume depletion and BP meds -  Improved with IVF resuscitation - Held lisinopril and normodyne  - Taper prednisone, today 45 mg   Postoperative blood loss anemia - Status post 2 units of PRBC transfusion  Thrombocytopenia - Likely related to postoperative blood loss anemia - Platelets are stable    DVT prophylaxis: SCD's bilaterally  Code Status: full code  Family  Communication: no family at the bedside Disposition Plan: per priamry   Consultants:   Cardio  TRH    Subjective: No overnight events.   Objective: Vitals:   01/24/16 1754 01/24/16 2048 01/25/16 0124 01/25/16 0441  BP: (!) 137/91 (!) 141/88 135/88 (!) 137/92  Pulse: 68 71 65 66  Resp: 17     Temp: 98.4 F (36.9 C) 98.5 F (36.9 C) 98.4 F (36.9 C) 98.3 F (36.8 C)  TempSrc: Oral Oral Oral Oral  SpO2: 98% 94% 97% 99%  Weight:      Height:        Intake/Output Summary (Last 24 hours) at 01/25/16 3474 Last data filed at 01/24/16 2300  Gross per 24 hour  Intake             1120 ml  Output                0 ml  Net             1120 ml   Filed Weights   01/20/16 2200  Weight: 123 kg (271 lb 2.7 oz)    Examination:  General exam: No distress, calm and comfortable  Respiratory system: Respiratory effort normal. No wheezing  Cardiovascular system: S1 & S2 heard, Rate controlled  Gastrointestinal system: () BS, non tender  Central nervous system: Nofocal  Extremities: No edema, palpable pulses  Skin: skin is warm dry, no lesions or ulcers  Psychiatry: Normal mood and behavior   Data Reviewed: I have personally reviewed following labs and imaging studies  CBC:  Recent Labs Lab 01/20/16 1751 01/21/16 0751 01/22/16 0519 01/23/16 0409 01/24/16  1124  WBC  --  9.6 8.2 9.6 8.5  NEUTROABS  --   --  5.4 7.4  --   HGB 11.5* 10.2* 8.2* 9.2* 9.6*  HCT 36.7 31.6* 26.0* 28.5* 30.2*  MCV  --  84.7 86.4 84.8 85.6  PLT  --  120* 102* 105* 703   Basic Metabolic Panel:  Recent Labs Lab 01/20/16 1604 01/20/16 1751 01/21/16 0751 01/22/16 0519 01/23/16 0003 01/23/16 0409  NA 142 139 142 142  --  142  K 3.9 4.6 4.1 3.8  --  3.9  CL  --  107 111 115*  --  112*  CO2  --  23 24 23   --  25  GLUCOSE 137* 166* 126* 113*  --  164*  BUN  --  16 16 15   --  10  CREATININE  --  1.23* 1.06* 0.96  --  0.84  CALCIUM  --  7.9* 7.7* 7.5*  --  8.2*  MG  --   --   --   --  2.1   --    GFR: Estimated Creatinine Clearance: 112.4 mL/min (by C-G formula based on SCr of 0.84 mg/dL). Liver Function Tests:  Recent Labs Lab 01/23/16 0409  AST 41  ALT 27  ALKPHOS 71  BILITOT 1.0  PROT 5.1*  ALBUMIN 2.7*   No results for input(s): LIPASE, AMYLASE in the last 168 hours. No results for input(s): AMMONIA in the last 168 hours. Coagulation Profile: No results for input(s): INR, PROTIME in the last 168 hours. Cardiac Enzymes:  Recent Labs Lab 01/21/16 1327 01/21/16 2240 01/22/16 0519 01/23/16 0003  CKTOTAL 1,461*  --   --  1,548*  CKMB 10.9*  --   --   --   TROPONINI 0.03* <0.03 <0.03  <0.03  --    BNP (last 3 results) No results for input(s): PROBNP in the last 8760 hours. HbA1C: No results for input(s): HGBA1C in the last 72 hours. CBG: No results for input(s): GLUCAP in the last 168 hours. Lipid Profile: No results for input(s): CHOL, HDL, LDLCALC, TRIG, CHOLHDL, LDLDIRECT in the last 72 hours. Thyroid Function Tests:  Recent Labs  01/23/16 0003  TSH 0.195*   Anemia Panel:  Recent Labs  01/23/16 0003  VITAMINB12 190  FERRITIN 91  TIBC 223*  IRON 38  RETICCTPCT 1.1   Urine analysis:    Component Value Date/Time   COLORURINE YELLOW 01/22/2016 1257   APPEARANCEUR CLEAR 01/22/2016 1257   LABSPEC 1.014 01/22/2016 1257   PHURINE 5.0 01/22/2016 1257   GLUCOSEU NEGATIVE 01/22/2016 1257   HGBUR NEGATIVE 01/22/2016 1257   BILIRUBINUR NEGATIVE 01/22/2016 1257   KETONESUR NEGATIVE 01/22/2016 1257   PROTEINUR NEGATIVE 01/22/2016 1257   UROBILINOGEN 1.0 01/08/2013 0912   NITRITE NEGATIVE 01/22/2016 1257   LEUKOCYTESUR TRACE (A) 01/22/2016 1257   Sepsis Labs: @LABRCNTIP (procalcitonin:4,lacticidven:4)   MRSA PCR Screening     Status: None   Collection Time: 01/20/16 10:12 PM  Result Value Ref Range Status   MRSA by PCR NEGATIVE NEGATIVE Final      Radiology Studies: Dg Lumbar Spine Complete Result Date: 01/20/2016 Lumbar fusions  performed at L3-4 and L4-5. Electronically Signed   By: Lorriane Shire M.D.   On: 01/20/2016 15:37   Dg C-arm Gt 120 Min Result Date: 01/20/2016  Lumbar fusions performed at L3-4 and L4-5. Electronically Signed   By: Lorriane Shire M.D.   On: 01/20/2016 15:37     Scheduled Meds: . calcium carbonate  1 tablet Oral TID WC  . docusate sodium  100 mg Oral BID  . gabapentin  300 mg Oral TID  . pantoprazole  40 mg Oral Daily  . [START ON 01/26/2016] predniSONE  45 mg Oral Q breakfast  . sodium chloride flush  3 mL Intravenous Q12H   Continuous Infusions: . sodium chloride       LOS: 5 days    Time spent: 15 minutes  Greater than 50% of the time spent on counseling and coordinating the care.   Leisa Lenz, MD [facility] Pager [phone]  If 7PM-7AM, please contact night-coverage [email or web address] Password [email or web address] 01/25/2016, 8:29 AM

## 2016-01-25 NOTE — Progress Notes (Signed)
Patient continues to refuse CPAP 

## 2016-01-26 LAB — FOLATE RBC
FOLATE, HEMOLYSATE: 270.2 ng/mL
Folate, RBC: 955 ng/mL (ref >498)
Hematocrit: 28.3 % — ABNORMAL LOW (ref 34.0–46.6)

## 2016-01-26 MED ORDER — PREDNISONE 5 MG PO TABS: 5.0000 mg | ORAL_TABLET | Freq: Every day | ORAL | Status: DC

## 2016-01-26 MED ORDER — TIZANIDINE HCL 4 MG PO TABS: 4.0000 mg | ORAL_TABLET | Freq: Four times a day (QID) | ORAL | 1 refills | Status: DC | PRN

## 2016-01-26 MED ORDER — PREDNISONE 20 MG PO TABS: 30.0000 mg | ORAL_TABLET | Freq: Every day | ORAL | Status: DC

## 2016-01-26 MED ORDER — PREDNISONE 5 MG PO TABS: 15.0000 mg | ORAL_TABLET | Freq: Every day | ORAL | Status: DC

## 2016-01-26 MED ORDER — PREDNISONE 10 MG PO TABS: 10.0000 mg | ORAL_TABLET | Freq: Every day | ORAL | Status: DC

## 2016-01-26 MED ORDER — PREDNISONE 10 MG PO TABS: ORAL_TABLET | ORAL | 0 refills | Status: DC

## 2016-01-26 MED ORDER — PREDNISONE 5 MG PO TABS: 40.0000 mg | ORAL_TABLET | Freq: Every day | ORAL | 0 refills | Status: DC

## 2016-01-26 MED ORDER — PREDNISONE 20 MG PO TABS: 40.0000 mg | ORAL_TABLET | Freq: Every day | ORAL | Status: DC

## 2016-01-26 MED ORDER — OXYCODONE-ACETAMINOPHEN 5-325 MG PO TABS: 1.0000 | ORAL_TABLET | ORAL | 0 refills | Status: DC | PRN

## 2016-01-26 MED ORDER — PREDNISONE 5 MG PO TABS: 25.0000 mg | ORAL_TABLET | Freq: Every day | ORAL | Status: DC

## 2016-01-26 MED ORDER — PREDNISONE 5 MG PO TABS: 35.0000 mg | ORAL_TABLET | Freq: Every day | ORAL | Status: DC

## 2016-01-26 MED ORDER — PREDNISONE 20 MG PO TABS: 20.0000 mg | ORAL_TABLET | Freq: Every day | ORAL | Status: DC

## 2016-01-26 NOTE — Progress Notes (Signed)
Pt discharged to home.  Discharge instructions explained to pt.  Pt has no questions at the time of discharge.  Pt states she has all belongings.  IV removed.  Pt taken off unit via volunteers by volunteer services.

## 2016-01-26 NOTE — Progress Notes (Signed)
Occupational Therapy Treatment and Discharge Patient Details Name: Kristen [name] MRN: [id number] DOB: [birth year] Today's Date: 01/26/2016    History of present illness 51 yo female s/p s/p TLIF L 3-4 L4-5 with pedicle screws with dural tear without CSF leak.  pt with hx of Anxiety, Depression, HTN, RLS, and previous Back Surgery.     OT comments  This 51 yo female admitted with above presents to acute OT today with all education completed and pt ready to go home from an OT standpoint. We will D/C from acute OT.  Follow Up Recommendations  No OT follow up;Supervision - Intermittent    Equipment Recommendations  3 in 1 bedside commode       Precautions / Restrictions Precautions Precautions: Back Precaution Comments: Can get up to bathroom without brace and shower without brace Required Braces or Orthoses: Spinal Brace Spinal Brace: Lumbar corset;Applied in sitting position Restrictions Weight Bearing Restrictions: No       Mobility Bed Mobility Overal bed mobility: Modified Independent                Transfers Overall transfer level: Needs assistance Equipment used: Rolling walker (2 wheeled) Transfers: Sit to/from Stand Sit to Stand: Supervision              Balance Overall balance assessment: Needs assistance Sitting-balance support: Feet supported;No upper extremity supported Sitting balance-Leahy Scale: Good     Standing balance support: No upper extremity supported Standing balance-Leahy Scale: Fair                     ADL Overall ADL's : Needs assistance/impaired    Pt verbalized understanding of using 2 cups for brushing teeth (spit and rinse cup) to avoid bending over sink.      Upper Body Bathing: Supervision/ safety;Sitting   Lower Body Bathing: Supervison/ safety;Sit to/from stand Lower Body Bathing Details (indicate cue type and reason): pt crossing one leg over the other, can do both today Upper Body Dressing :  Supervision/safety;Sitting   Lower Body Dressing: Supervision/safety;Sit to/from stand Lower Body Dressing Details (indicate cue type and reason): pt crossing one leg over the other, can do both today Toilet Transfer: Supervision/safety;Ambulation;RW;BSC (in bathroom)                              Cognition   Behavior During Therapy: WFL for tasks assessed/performed Overall Cognitive Status: Within Functional Limits for tasks assessed                                    Pertinent Vitals/ Pain       Pain Assessment: 0-10 Pain Score: 4  Pain Location: right thigh and back Pain Descriptors / Indicators: Aching;Sore Pain Intervention(s): Limited activity within patient's tolerance;Monitored during session;Repositioned         Frequency  Min 2X/week        Progress Toward Goals  OT Goals(current goals can now be found in the care plan section)  Progress towards OT goals:  (Education completed)     Plan Discharge plan remains appropriate       End of Session Equipment Utilized During Treatment: Rolling walker;Back brace   Activity Tolerance Patient tolerated treatment well   Patient Left in chair;with call bell/phone within reach   Nurse Communication  (requested nurse to come in and change dressing (RN did  this during session so pt could get dressed))        Time: 5749-3552 OT Time Calculation (min): 34 min  Charges: OT General Charges $OT Visit: 1 Procedure OT Treatments $Self Care/Home Management : 23-37 mins  Almon Register [phone] 01/26/2016, 8:59 AM

## 2016-01-26 NOTE — Progress Notes (Signed)
     Subjective: 6 Days Post-Op Procedure(s) (LRB): Removal of rod at right L2 to L3, Transforaminal lumbar interbody fusion right L3-4 and right L4-5 with pedicle screws and rods L2, L3, L4 and L5, local bone graft, allograft and Vivigen (N/A) Awake, alert and oriented x 4. Up and to the bathroom with assistance. Cardiology and Internal medicine consults much appreciated. Results of  CT angio of the chest negative for PE, borderline cardiomegally, ECHO With Grade I CHF, min regurg. Anemia is stable with Hgb 9.6, Renal  Function is normal. Tolerating po nourishment and po narcotics. PT saw her while in the stepdown but not on 5 N.  Patient reports pain as moderate.    Objective:   VITALS:  Temp:  [97.8 F (36.6 C)-98.3 F (36.8 C)] 98 F (36.7 C) (01/08 0347) Pulse Rate:  [65-72] 65 (01/08 0347) Resp:  [16] 16 (01/08 0347) BP: (119-132)/(77-96) 119/86 (01/08 0347) SpO2:  [95 %-99 %] 99 % (01/08 0347)  Neurologically intact ABD soft Neurovascular intact Sensation intact distally Intact pulses distally Dorsiflexion/Plantar flexion intact Incision: scant drainage   LABS  Recent Labs  01/24/16 1124  HGB 9.6*  WBC 8.5  PLT 150   No results for input(s): NA, K, CL, CO2, BUN, CREATININE, GLUCOSE in the last 72 hours. No results for input(s): LABPT, INR in the last 72 hours.   Assessment/Plan: 6 Days Post-Op Procedure(s) (LRB): Removal of rod at right L2 to L3, Transforaminal lumbar interbody fusion right L3-4 and right L4-5 with pedicle screws and rods L2, L3, L4 and L5, local bone graft, allograft and Vivigen (N/A)  Advance diet Up with therapy D/C IV fluids Discharge home with home health  Have seen by PT today to ensure arrangements for home exercise understood and that she is adequately independent for discharge. Orthopaedically stable.   Jessy Oto 01/26/2016, 10:24 AM Patient ID: Kristen [name], female   DOB: June 20, 1965, 51 y.o.   MRN: [id number]

## 2016-01-26 NOTE — Discharge Summary (Addendum)
Physician Discharge Summary      Patient ID: Kristen [name] MRN: [id number] DOB/AGE: 1966/01/08 51 y.o.  Admit date: 01/20/2016 Discharge date:01/26/2016   Admission Diagnoses:  Principal Problem:   Orthostatic hypotension Active Problems:   Postoperative anemia   Morbid obesity with BMI of 40.0-44.9, adult (HCC)   OSA (obstructive sleep apnea)   Spinal stenosis of lumbar region with neurogenic claudication   Chest pain   Essential hypertension   Hypocalcemia   Thrombocytopenia (HCC)   Chronic diarrhea   Discharge Diagnoses:  Same  Past Medical History:  Diagnosis Date  . Allergy   . Anemia    Iron deficiency  . Anxiety   . Arthritis    back, left shoulder  . Chicken pox   . Depression   . Diarrhea    takes Imodium daily  . GERD (gastroesophageal reflux disease)   . H/O hiatal hernia   . Headache(784.0)   . History of kidney stones    1996ish  . Hypertension   . Migraine    none for 5 years (as of 01/13/16)  . OSA (obstructive sleep apnea) 10/13/2015   unable to get cpap, plans to get one in 2018  . Pneumonia   . Restless legs   . Scoliosis   . Shortness of breath    with exertion    Surgeries: Procedure(s): Removal of rod at right L2 to L3, Transforaminal lumbar interbody fusion right L3-4 and right L4-5 with pedicle screws and rods L2, L3, L4 and L5, local bone graft, allograft and Vivigen on 01/20/2016   Consultants: Treatment Team:  Rounding L[facility]iology, MD, Dr. Fransico Him. [facility]. Leisa Lenz, MD.  Discharged Condition: Improved  Hospital Course: Kristen [name] is an 51 y.o. female who was admitted 01/20/2016 with a chief complaint of No chief complaint on file. , and found to have a diagnosis of Orthostatic hypotension.  She was brought to the operating room on 01/20/2016 and underwent the above named procedures.    She was given perioperative antibiotics:  Anti-infectives    Start     Dose/Rate Route Frequency Ordered Stop   01/21/16 0000  ceFAZolin (ANCEF) IVPB 2g/100 mL premix     2 g 200 mL/hr over 30 Minutes Intravenous Every 8 hours 01/20/16 2212 01/21/16 0845   01/20/16 0700  ceFAZolin (ANCEF) IVPB 2g/100 mL premix     2 g 200 mL/hr over 30 Minutes Intravenous On call to O.R. 01/19/16 9767 01/20/16 1554    Following recovering in the PACU she was transferred to the stepdown unit due to prolong anesthesia, history of sleep apnea and blood loss 1500cc. In stepdown unit blood pressure was low, Hgb decreased to 8.2, she was given IVF for hypovolemia and anemia. She complained of chest pain, history of GERD, it did not respond to mylanta and antiacids. EKG showed possible lateral ischemia. Troponin and CPK levels showed borderline elevation. Cardiology consulted and an acute MI was felt to be less likely, an echocardiogram was ordered. Patients blood pressure remained decreased despite fluid boluses and increased IV rate. POD#2 a cortisol level returned decreased so IV solucortef was give. An internal medicine consult was recommended. Blood transfusion was recommended and she received 2 units of PRBCS. Holding antihypertensive agents, providing steroid coverage and transfusion helped to stabilize her blood pressure. She continued with some mild Chest discomfort and a CT angiogram was performed. This was negative for PE. Physical therapy was able to begin progressive ambulation while in the stepdown unit.  She was able to be mobilized. POD#4 she was transferred to [place] floor. She remained stable with  Blood pressures in the 712-458 systolic level. She was placed on a prednisone taper. POD#7 awake alert and oriented x 4, dressing changed. Mimimal, scant clear drainage. She was discharged home, Instructions from IM for staying on lisinopril, then as her blood pressure continues it she shows return of hypertension then start normadyne and then norvasc if hypertension returns. She was given sequential compression devices,  early ambulation, and chemoprophylaxis for DVT prophylaxis.  She benefited maximally from their hospital stay and there were no complications.    Recent vital signs:  Vitals:   01/26/16 0147 01/26/16 0347  BP:  119/86  Pulse:  65  Resp: 16 16  Temp:  98 F (36.7 C)    Recent laboratory studies:  Results for orders placed or performed during the hospital encounter of 01/20/16  MRSA PCR Screening  Result Value Ref Range   MRSA by PCR NEGATIVE NEGATIVE  Basic metabolic panel  Result Value Ref Range   Sodium 139 135 - 145 mmol/L   Potassium 4.6 3.5 - 5.1 mmol/L   Chloride 107 101 - 111 mmol/L   CO2 23 22 - 32 mmol/L   Glucose, Bld 166 (H) 65 - 99 mg/dL   BUN 16 6 - 20 mg/dL   Creatinine, Ser 1.23 (H) 0.44 - 1.00 mg/dL   Calcium 7.9 (L) 8.9 - 10.3 mg/dL   GFR calc non Af Amer 50 (L) >60 mL/min   GFR calc Af Amer 58 (L) >60 mL/min   Anion gap 9 5 - 15  Hemoglobin and hematocrit, blood  Result Value Ref Range   Hemoglobin 11.5 (L) 12.0 - 15.0 g/dL   HCT 36.7 36.0 - 46.0 %  CBC  Result Value Ref Range   WBC 9.6 4.0 - 10.5 K/uL   RBC 3.73 (L) 3.87 - 5.11 MIL/uL   Hemoglobin 10.2 (L) 12.0 - 15.0 g/dL   HCT 31.6 (L) 36.0 - 46.0 %   MCV 84.7 78.0 - 100.0 fL   MCH 27.3 26.0 - 34.0 pg   MCHC 32.3 30.0 - 36.0 g/dL   RDW 14.3 11.5 - 15.5 %   Platelets 120 (L) 150 - 400 K/uL  Basic Metabolic Panel  Result Value Ref Range   Sodium 142 135 - 145 mmol/L   Potassium 4.1 3.5 - 5.1 mmol/L   Chloride 111 101 - 111 mmol/L   CO2 24 22 - 32 mmol/L   Glucose, Bld 126 (H) 65 - 99 mg/dL   BUN 16 6 - 20 mg/dL   Creatinine, Ser 1.06 (H) 0.44 - 1.00 mg/dL   Calcium 7.7 (L) 8.9 - 10.3 mg/dL   GFR calc non Af Amer >60 >60 mL/min   GFR calc Af Amer >60 >60 mL/min   Anion gap 7 5 - 15  Troponin I (q 6hr x 3)  Result Value Ref Range   Troponin I 0.03 (HH) <0.03 ng/mL  CK total and CKMB (cardiac)not at [facility]  Result Value Ref Range   Total CK 1,461 (H) 38 - 234 U/L   CK, MB 10.9 (H) 0.5 -  5.0 ng/mL   Relative Index 0.7 0.0 - 2.5  CBC with Differential/Platelet  Result Value Ref Range   WBC 8.2 4.0 - 10.5 K/uL   RBC 3.01 (L) 3.87 - 5.11 MIL/uL   Hemoglobin 8.2 (L) 12.0 - 15.0 g/dL   HCT 26.0 (L) 36.0 - 46.0 %  MCV 86.4 78.0 - 100.0 fL   MCH 27.2 26.0 - 34.0 pg   MCHC 31.5 30.0 - 36.0 g/dL   RDW 14.8 11.5 - 15.5 %   Platelets 102 (L) 150 - 400 K/uL   Neutrophils Relative % 66 %   Neutro Abs 5.4 1.7 - 7.7 K/uL   Lymphocytes Relative 25 %   Lymphs Abs 2.0 0.7 - 4.0 K/uL   Monocytes Relative 8 %   Monocytes Absolute 0.6 0.1 - 1.0 K/uL   Eosinophils Relative 1 %   Eosinophils Absolute 0.1 0.0 - 0.7 K/uL   Basophils Relative 0 %   Basophils Absolute 0.0 0.0 - 0.1 K/uL  Basic metabolic panel  Result Value Ref Range   Sodium 142 135 - 145 mmol/L   Potassium 3.8 3.5 - 5.1 mmol/L   Chloride 115 (H) 101 - 111 mmol/L   CO2 23 22 - 32 mmol/L   Glucose, Bld 113 (H) 65 - 99 mg/dL   BUN 15 6 - 20 mg/dL   Creatinine, Ser 0.96 0.44 - 1.00 mg/dL   Calcium 7.5 (L) 8.9 - 10.3 mg/dL   GFR calc non Af Amer >60 >60 mL/min   GFR calc Af Amer >60 >60 mL/min   Anion gap 4 (L) 5 - 15  Cortisol-am, blood  Result Value Ref Range   Cortisol - AM 5.7 (L) 6.7 - 22.6 ug/dL  Troponin I  Result Value Ref Range   Troponin I <0.03 <0.03 ng/mL  Troponin I  Result Value Ref Range   Troponin I <0.03 <0.03 ng/mL  Troponin I  Result Value Ref Range   Troponin I <0.03 <0.03 ng/mL  Urinalysis, Routine w reflex microscopic  Result Value Ref Range   Color, Urine YELLOW YELLOW   APPearance CLEAR CLEAR   Specific Gravity, Urine 1.014 1.005 - 1.030   pH 5.0 5.0 - 8.0   Glucose, UA NEGATIVE NEGATIVE mg/dL   Hgb urine dipstick NEGATIVE NEGATIVE   Bilirubin Urine NEGATIVE NEGATIVE   Ketones, ur NEGATIVE NEGATIVE mg/dL   Protein, ur NEGATIVE NEGATIVE mg/dL   Nitrite NEGATIVE NEGATIVE   Leukocytes, UA TRACE (A) NEGATIVE   RBC / HPF 0-5 0 - 5 RBC/hpf   WBC, UA 0-5 0 - 5 WBC/hpf   Bacteria, UA  NONE SEEN NONE SEEN   Squamous Epithelial / LPF 0-5 (A) NONE SEEN  Sodium, urine, random  Result Value Ref Range   Sodium, Ur 66 mmol/L  CBC with Differential/Platelet  Result Value Ref Range   WBC 9.6 4.0 - 10.5 K/uL   RBC 3.36 (L) 3.87 - 5.11 MIL/uL   Hemoglobin 9.2 (L) 12.0 - 15.0 g/dL   HCT 28.5 (L) 36.0 - 46.0 %   MCV 84.8 78.0 - 100.0 fL   MCH 27.4 26.0 - 34.0 pg   MCHC 32.3 30.0 - 36.0 g/dL   RDW 15.0 11.5 - 15.5 %   Platelets 105 (L) 150 - 400 K/uL   Neutrophils Relative % 77 %   Neutro Abs 7.4 1.7 - 7.7 K/uL   Lymphocytes Relative 18 %   Lymphs Abs 1.7 0.7 - 4.0 K/uL   Monocytes Relative 4 %   Monocytes Absolute 0.4 0.1 - 1.0 K/uL   Eosinophils Relative 0 %   Eosinophils Absolute 0.0 0.0 - 0.7 K/uL   Basophils Relative 0 %   Basophils Absolute 0.0 0.0 - 0.1 K/uL  Comprehensive metabolic panel  Result Value Ref Range   Sodium 142 135 - 145 mmol/L  Potassium 3.9 3.5 - 5.1 mmol/L   Chloride 112 (H) 101 - 111 mmol/L   CO2 25 22 - 32 mmol/L   Glucose, Bld 164 (H) 65 - 99 mg/dL   BUN 10 6 - 20 mg/dL   Creatinine, Ser 0.84 0.44 - 1.00 mg/dL   Calcium 8.2 (L) 8.9 - 10.3 mg/dL   Total Protein 5.1 (L) 6.5 - 8.1 g/dL   Albumin 2.7 (L) 3.5 - 5.0 g/dL   AST 41 15 - 41 U/L   ALT 27 14 - 54 U/L   Alkaline Phosphatase 71 38 - 126 U/L   Total Bilirubin 1.0 0.3 - 1.2 mg/dL   GFR calc non Af Amer >60 >60 mL/min   GFR calc Af Amer >60 >60 mL/min   Anion gap 5 5 - 15  Vitamin B12  Result Value Ref Range   Vitamin B-12 190 180 - 914 pg/mL  CK  Result Value Ref Range   Total CK 1,548 (H) 38 - 234 U/L  D-dimer, quantitative (not at [facility])  Result Value Ref Range   D-Dimer, Quant 2.44 (H) 0.00 - 0.50 ug/mL-FEU  Iron and TIBC  Result Value Ref Range   Iron 38 28 - 170 ug/dL   TIBC 223 (L) 250 - 450 ug/dL   Saturation Ratios 17 10.4 - 31.8 %   UIBC 185 ug/dL  Ferritin  Result Value Ref Range   Ferritin 91 11 - 307 ng/mL  Haptoglobin  Result Value Ref Range   Haptoglobin  154 34 - 200 mg/dL  Lactate dehydrogenase  Result Value Ref Range   LDH 220 (H) 98 - 192 U/L  Magnesium  Result Value Ref Range   Magnesium 2.1 1.7 - 2.4 mg/dL  Reticulocytes  Result Value Ref Range   Retic Ct Pct 1.1 0.4 - 3.1 %   RBC. 3.27 (L) 3.87 - 5.11 MIL/uL   Retic Count, Manual 36.0 19.0 - 186.0 K/uL  VITAMIN D 25 Hydroxy (Vit-D Deficiency, Fractures)  Result Value Ref Range   Vit D, 25-Hydroxy 12.9 (L) 30.0 - 100.0 ng/mL  TSH  Result Value Ref Range   TSH 0.195 (L) 0.350 - 4.500 uIU/mL  CBC  Result Value Ref Range   WBC 8.5 4.0 - 10.5 K/uL   RBC 3.53 (L) 3.87 - 5.11 MIL/uL   Hemoglobin 9.6 (L) 12.0 - 15.0 g/dL   HCT 30.2 (L) 36.0 - 46.0 %   MCV 85.6 78.0 - 100.0 fL   MCH 27.2 26.0 - 34.0 pg   MCHC 31.8 30.0 - 36.0 g/dL   RDW 14.7 11.5 - 15.5 %   Platelets 150 150 - 400 K/uL  I-STAT 4, (NA,K, GLUC, HGB,HCT)  Result Value Ref Range   Sodium 142 135 - 145 mmol/L   Potassium 5.0 3.5 - 5.1 mmol/L   Glucose, Bld 126 (H) 65 - 99 mg/dL   HCT 33.0 (L) 36.0 - 46.0 %   Hemoglobin 11.2 (L) 12.0 - 15.0 g/dL  I-STAT 4, (NA,K, GLUC, HGB,HCT)  Result Value Ref Range   Sodium 138 135 - 145 mmol/L   Potassium 7.3 (HH) 3.5 - 5.1 mmol/L   Glucose, Bld 138 (H) 65 - 99 mg/dL   HCT 26.0 (L) 36.0 - 46.0 %   Hemoglobin 8.8 (L) 12.0 - 15.0 g/dL  I-STAT 4, (NA,K, GLUC, HGB,HCT)  Result Value Ref Range   Sodium 142 135 - 145 mmol/L   Potassium 4.0 3.5 - 5.1 mmol/L   Glucose, Bld 133 (H) 65 -  99 mg/dL   HCT 32.0 (L) 36.0 - 46.0 %   Hemoglobin 10.9 (L) 12.0 - 15.0 g/dL  I-STAT 4, (NA,K, GLUC, HGB,HCT)  Result Value Ref Range   Sodium 142 135 - 145 mmol/L   Potassium 3.9 3.5 - 5.1 mmol/L   Glucose, Bld 137 (H) 65 - 99 mg/dL   HCT 33.0 (L) 36.0 - 46.0 %   Hemoglobin 11.2 (L) 12.0 - 15.0 g/dL  ECHOCARDIOGRAM COMPLETE  Result Value Ref Range   Weight 4,338.65 oz   Height 69 in   BP 117/65 mmHg  Prepare RBC  Result Value Ref Range   Order Confirmation ORDER PROCESSED BY BLOOD  BANK   Prepare RBC  Result Value Ref Range   Order Confirmation ORDER PROCESSED BY BLOOD BANK     Discharge Medications:   Allergies as of 01/26/2016      Reactions   Other Hives, Swelling   tomato   Sulfur Hives, Swelling      Medication List    STOP taking these medications   amLODipine 10 MG tablet Commonly known as:  NORVASC   baclofen 10 MG tablet Commonly known as:  LIORESAL   ibuprofen 200 MG tablet Commonly known as:  ADVIL,MOTRIN   labetalol 100 MG tablet Commonly known as:  NORMODYNE   traMADol 50 MG tablet Commonly known as:  ULTRAM     TAKE these medications   Bismuth Subsalicylate 833 MG Tabs Take 2 tablets by mouth every 8 (eight) hours as needed (for upset stomach/diarrhea).   diclofenac 75 MG EC tablet Commonly known as:  VOLTAREN Take 75 mg by mouth 2 (two) times daily as needed (for pain.).   gabapentin 300 MG capsule Commonly known as:  NEURONTIN Take 300 mg by mouth 3 (three) times daily.   lisinopril 20 MG tablet Commonly known as:  PRINIVIL,ZESTRIL Take 1 tablet (20 mg total) by mouth daily.   loperamide 2 MG capsule Commonly known as:  IMODIUM Take 2-4 mg by mouth 2 (two) times daily as needed for diarrhea or loose stools.   oxyCODONE-acetaminophen 5-325 MG tablet Commonly known as:  PERCOCET/ROXICET Take 1-2 tablets by mouth every 4 (four) hours as needed for moderate pain.   pantoprazole 40 MG tablet Commonly known as:  PROTONIX Take 1 tablet (40 mg total) by mouth daily.   predniSONE 5 MG tablet Commonly known as:  DELTASONE Take 8 tablets (40 mg total) by mouth daily with breakfast.   predniSONE 10 MG tablet Commonly known as:  DELTASONE Take 4 tablets po 01/27/2016, Take 3 and 1/2 tablets po 01/28/2016, Take 3 tablets po 1/11/2018m Take 2 and 1/2 tablets po 01/30/2016, Take 2 tablets po 01/31/2016, Take 1 and 1/2 tablets po 02/01/2016, Take 1 tablet po 02/02/2016, Then take 1/2 tablet po on 02/03/2016.   tiZANidine 4 MG  tablet Commonly known as:  ZANAFLEX Take 1 tablet (4 mg total) by mouth every 6 (six) hours as needed for muscle spasms. What changed:  when to take this            Durable Medical Equipment        Start     Ordered   01/20/16 2213  DME Walker rolling  Once    Question:  Patient needs a walker to treat with the following condition  Answer:  Spinal stenosis of lumbar region with neurogenic claudication   01/20/16 2212   01/20/16 2213  DME 3 n 1  Once     01/20/16 2212  Diagnostic Studies: Dg Lumbar Spine Complete  Result Date: 01/20/2016 CLINICAL DATA:  Lumbar disc disease. EXAM: DG C-ARM GT 120 MIN; LUMBAR SPINE - COMPLETE 4+ VIEW FLUOROSCOPY TIME:  Fluoroscopy Time:  2 minutes 45 seconds COMPARISON:  MRI dated 11/19/2015 FINDINGS: Patient has undergone interbody and posterior fusion at L3-4 and L4-5. Alignment of the vertebra appears anatomic. Hardware appears in good position in the AP and lateral projections. IMPRESSION: Lumbar fusions performed at L3-4 and L4-5. Electronically Signed   By: Lorriane Shire M.D.   On: 01/20/2016 15:37   Ct Angio Chest Pe W Or Wo Contrast  Result Date: 01/23/2016 CLINICAL DATA:  Recent lumbar fusion surgery. Chest pain and elevated D-dimer postoperatively. Tachycardia and hypotension. EXAM: CT ANGIOGRAPHY CHEST WITH CONTRAST TECHNIQUE: Multidetector CT imaging of the chest was performed using the standard protocol during bolus administration of intravenous contrast. Multiplanar CT image reconstructions and MIPs were obtained to evaluate the vascular anatomy. CONTRAST:  100 mL Isovue 370 IV COMPARISON:  None. FINDINGS: Cardiovascular: Satisfactory opacification of the pulmonary arteries to the segmental level. No evidence of pulmonary embolism. Heart is top-normal in size. No pericardial effusion. Mediastinum/Nodes: No evidence of mediastinal masses. No mediastinal, hilar or axillary lymphadenopathy identified. Lungs/Pleura: Mild bilateral pulmonary  scarring. There is no evidence of pulmonary edema, consolidation, pneumothorax, nodule or pleural fluid. Upper Abdomen: Visualized upper abdomen is unremarkable. Musculoskeletal: The bony thorax demonstrates degenerative disc disease the thoracic spine. Rightward convex scoliosis present. No evidence of fracture or bony lesion. Review of the MIP images confirms the above findings. IMPRESSION: No evidence of pulmonary embolism or other acute findings. The heart is top-normal in size. Electronically Signed   By: Aletta Edouard M.D.   On: 01/23/2016 11:45   Dg C-arm Gt 120 Min  Result Date: 01/20/2016 CLINICAL DATA:  Lumbar disc disease. EXAM: DG C-ARM GT 120 MIN; LUMBAR SPINE - COMPLETE 4+ VIEW FLUOROSCOPY TIME:  Fluoroscopy Time:  2 minutes 45 seconds COMPARISON:  MRI dated 11/19/2015 FINDINGS: Patient has undergone interbody and posterior fusion at L3-4 and L4-5. Alignment of the vertebra appears anatomic. Hardware appears in good position in the AP and lateral projections. IMPRESSION: Lumbar fusions performed at L3-4 and L4-5. Electronically Signed   By: Lorriane Shire M.D.   On: 01/20/2016 15:37    Disposition: 01-Home or Self Care  Discharge Instructions    Call MD / Call 911    Complete by:  As directed    If you experience chest pain or shortness of breath, CALL 911 and be transported to the hospital emergency room.  If you develope a fever above 101 F, pus (white drainage) or increased drainage or redness at the wound, or calf pain, call your surgeon's office.   Constipation Prevention    Complete by:  As directed    Drink plenty of fluids.  Prune juice may be helpful.  You may use a stool softener, such as Colace (over the counter) 100 mg twice a day.  Use MiraLax (over the counter) for constipation as needed.   Diet - low sodium heart healthy    Complete by:  As directed    Discharge instructions    Complete by:  As directed    Call if there is increasing drainage, fever greater than  101.5, severe head aches, and worsening nausea or light sensitivity. If shortness of breath, bloody cough or chest tightness or pain go to an emergency room. No lifting greater than 10 lbs. Avoid bending, stooping and twisting.  Use brace when sitting and out of bed even to go to bathroom. Walk in house for first 2 weeks then may start to get out slowly increasing distances up to one mile by 4-6 weeks post op. May shower and change dressing following bathing with shower.When bathing remove the brace shower and replace brace before getting out of the shower. If drainage, keep dry dressing and do not bathe the incision, use an moisture impervious dressing. Please call and return for scheduled follow up appointment 2 weeks from the time of surgery.   Taper down prednisone starting from 40 mg a day from 01/27/2016 , taper down by 5 mg a day down to 0 mg. for ex, tomorrow 40 mg, then 35 the following day etc until you are down to 0 mg, then stop.  Holding blood pressure meds due to her hypotension. BP now 119/86.. On discharge would recommend continuing lisinopril only and then add normodyne and norvasc if BP remains stable above 120/80 in outpatient setting   Driving restrictions    Complete by:  As directed    No driving for 2 weeks   Increase activity slowly as tolerated    Complete by:  As directed    Lifting restrictions    Complete by:  As directed    No lifting for 8 weeks      Follow-up Information    Jessy Oto, MD Follow up in 2 week(s).   Specialty:  Orthopedic Surgery Why:  For wound re-check Contact information: [address] [place] [address] [phone]        Inc[facility] - [facility] Follow up.   Why:  rolling walker and 3 n 1 will be brought up to patient's room before dc. Contact information: [address] [place] [address] [facility] Follow up.   Why:  HH[facility] Contact information: [address] [place] [address] [phone]            Signed: Jessy Oto 01/26/2016, 12:31 PM

## 2016-01-26 NOTE — Progress Notes (Signed)
If pt stable for discharge prescription sent to printer for prednisone taper Taper down prednisone starting from 40 mg a day from 01/27/2016 , taper down by 5 mg a day down to 0 mg. for ex, tomorrow 40 mg, then 35 the following day etc until you are down to 0 mg, then stop.  Holding blood pressure meds due to her hypotension. BP now 119/86.. On discharge would recommend continuing lisinopril only and then add normodyne and norvasc if BP remains stable above 120/80 in outpatient setting.    Leisa Lenz [facility] [phone]

## 2016-02-04 ENCOUNTER — Ambulatory Visit: Payer: BC Managed Care – PPO | Admitting: Family

## 2016-02-06 ENCOUNTER — Ambulatory Visit (INDEPENDENT_AMBULATORY_CARE_PROVIDER_SITE_OTHER): Payer: BC Managed Care – PPO | Admitting: Specialist

## 2016-02-06 ENCOUNTER — Encounter (INDEPENDENT_AMBULATORY_CARE_PROVIDER_SITE_OTHER): Payer: Self-pay | Admitting: Specialist

## 2016-02-06 ENCOUNTER — Ambulatory Visit (INDEPENDENT_AMBULATORY_CARE_PROVIDER_SITE_OTHER): Payer: BC Managed Care – PPO

## 2016-02-06 VITALS — BP 126/79 | HR 65 | Ht 69.0 in | Wt 267.0 lb

## 2016-02-06 DIAGNOSIS — M48062 Spinal stenosis, lumbar region with neurogenic claudication: Secondary | ICD-10-CM | POA: Diagnosis not present

## 2016-02-06 DIAGNOSIS — Z981 Arthrodesis status: Secondary | ICD-10-CM

## 2016-02-06 MED ORDER — OXYCODONE-ACETAMINOPHEN 5-325 MG PO TABS: 1.0000 | ORAL_TABLET | ORAL | 0 refills | Status: DC | PRN

## 2016-02-06 NOTE — Progress Notes (Deleted)
Office Visit Note   Patient: Kristen [name]           Date of Birth: March 18, 1965           MRN: [id number] Visit Date: 02/06/2016              Requested by: [address], [place], [place] [address] PCP: Mauricio Po, FNP   Assessment & Plan: Visit Diagnoses:  1. Spinal stenosis of lumbar region with neurogenic claudication   2. Status post lumbar spinal fusion     Plan: ***  Follow-Up Instructions: Return in about 3 weeks (around 02/27/2016).   Orders:  Orders Placed This Encounter  Procedures  . XR Lumbar Spine 2-3 Views   Meds ordered this encounter  Medications  . oxyCODONE-acetaminophen (PERCOCET/ROXICET) 5-325 MG tablet    Sig: Take 1-2 tablets by mouth every 4 (four) hours as needed for moderate pain.    Dispense:  60 tablet    Refill:  0      Procedures: No procedures performed   Clinical Data: No additional findings.   Subjective: Chief Complaint  Patient presents with  . Lower Back - Follow-up, Routine Post Op    HPI  Review of Systems   Objective: Vital Signs: BP 126/79 (BP Location: Left Arm, Patient Position: Sitting)   Pulse 65   Ht 5' 9"  (1.753 m)   Wt 267 lb (121.1 kg)   BMI 39.43 kg/m   Physical Exam  Ortho Exam  Specialty Comments:  No specialty comments available.  Imaging: Xr Lumbar Spine 2-3 Views  Result Date: 02/06/2016 AP and Lateral Lumbar spine with pedicle screws and rods fixing L3 to L5. Residual right lumbar scoliosis, hardware is intact no sign of loosening. Residual Harrington rod with the lower hook removed.    PMFS History: Patient Active Problem List   Diagnosis Date Noted  . Spinal stenosis of lumbar region 01/20/2016    Priority: High    Class: Chronic  . DDD (degenerative disc disease), lumbar 01/20/2016    Priority: High    Class: Chronic  . Hypocalcemia 01/22/2016  . Thrombocytopenia ([place]) 01/22/2016  . Chronic diarrhea 01/22/2016  . Chest pain   . Essential  hypertension   . Orthostatic hypotension   . Spinal stenosis of lumbar region with neurogenic claudication 01/20/2016  . Low back pain 12/22/2015  . Pre-op examination 12/22/2015  . OSA (obstructive sleep apnea) 10/13/2015  . Muscle cramp 08/01/2015  . GERD (gastroesophageal reflux disease) 07/09/2015  . Excessive daytime sleepiness 06/18/2015  . Shingles 08/28/2013  . Postop check 08/28/2013  . Cholelithiasis 07/10/2013  . Colitis 01/08/2013  . Postoperative anemia 01/08/2013  . Morbid obesity with BMI of 40.0-44.9, adult ([place]) 01/08/2013   Past Medical History:  Diagnosis Date  . Allergy   . Anemia    Iron deficiency  . Anxiety   . Arthritis    back, left shoulder  . Chicken pox   . Depression   . Diarrhea    takes Imodium daily  . GERD (gastroesophageal reflux disease)   . H/O hiatal hernia   . Headache(784.0)   . History of kidney stones    1996ish  . Hypertension   . Migraine    none for 5 years (as of 01/13/16)  . OSA (obstructive sleep apnea) 10/13/2015   unable to get cpap, plans to get one in 2018  . Pneumonia   . Restless legs   . Scoliosis   .  Shortness of breath    with exertion    Family History  Problem Relation Age of Onset  . Heart disease Father   . Heart attack Father   . Hypertension Mother   . Arthritis Mother   . Diabetes Maternal Grandmother   . Diabetes Paternal Grandmother   . Diabetes Maternal Uncle     Past Surgical History:  Procedure Laterality Date  . BACK SURGERY  07/21/1978  . CHOLECYSTECTOMY N/A 08/15/2013   Procedure: LAPAROSCOPIC CHOLECYSTECTOMY WITH INTRAOPERATIVE CHOLANGIOGRAM;  Surgeon: Gwenyth Ober, MD;  Location: [place];  Service: General;  Laterality: N/A;  . COLONOSCOPY N/A 01/09/2013   Procedure: COLONOSCOPY;  Surgeon: Beryle Beams, MD;  Location: [place];  Service: Endoscopy;  Laterality: N/A;  . HERNIA REPAIR Left 1981  . TONSILLECTOMY    . TUBAL LIGATION  06/1988   Social History   Occupational History    . Safety Assistant    Social History Main Topics  . Smoking status: Never Smoker  . Smokeless tobacco: Never Used  . Alcohol use 4.2 oz/week    7 Cans of beer per week     Comment: 1 beer daily  . Drug use: No  . Sexual activity: Yes    Birth control/ protection: Surgical

## 2016-02-06 NOTE — Patient Instructions (Signed)
Call if there is increasing drainage, fever greater than 101.5, severe head aches, and worsening nausea or light sensitivity. If shortness of breath, bloody cough or chest tightness or pain go to an emergency room. No lifting greater than 10 lbs. Avoid bending, stooping and twisting. Use brace when sitting and out of bed even to go to bathroom. Walk in house for first 2 weeks then may start to get out slowly increasing distances up to one mile by 4-6 weeks post op. May shower and change dressing following bathing with shower. After one week may discontinue dressing. When bathing remove the brace shower and replace brace before getting out of the shower. Please call and return for scheduled follow up appointment 3 weeks from today.

## 2016-02-06 NOTE — Progress Notes (Signed)
Post-Op Visit Note   Patient: Kristen [name]           Date of Birth: 1965/12/25           MRN: [id number] Visit Date: 02/06/2016 PCP: Mauricio Po, FNP   Assessment & Plan: Doing well 2 1/2 weeks post op.  Chief Complaint:  Ms. Shed is here 2 weeks and 3 days post op for removal of rod at right L2 to L3, Transforaminal lumbar interbody fusion right L3-4 and right L4-5 with pedicle screws and rods L2, L3, L4 and L5, local bone graft, allograft and Vivigen.  She states that she doing great. She does have some occasional pain in the Left leg, and some aching in the right leg but overall she is much better..  Visit Diagnoses: Post op 2 1/2 weeks. 1. Spinal stenosis of lumbar region with neurogenic claudication   Incision is healing no drainage, no erythrema, dressing changed. No sign of infection. Lower extremity motor and sensory intact and normal Xray with no abnormalities. Plan:    Call if there is increasing drainage, fever greater than 101.5, severe head aches, and worsening nausea or light sensitivity. If shortness of breath, bloody cough or chest tightness or pain go to an emergency room. No lifting greater than 10 lbs. Avoid bending, stooping and twisting. Use brace when sitting and out of bed even to go to bathroom. Walk in house for first 2 weeks then may start to get out slowly increasing distances up to one mile by 4-6 weeks post op. May shower and change dressing following bathing with shower. After one week may discontinue dressing. When bathing remove the brace shower and replace brace before getting out of the shower. Please call and return for scheduled follow up appointment 3 weeks from today.  Follow-Up Instructions: No Follow-up on file.   Orders:  Orders Placed This Encounter  Procedures  . XR Lumbar Spine 2-3 Views   No orders of the defined types were placed in this encounter.   Imaging: No results found.  PMFS History: Patient Active  Problem List   Diagnosis Date Noted  . Hypocalcemia 01/22/2016  . Thrombocytopenia ([place]) 01/22/2016  . Chronic diarrhea 01/22/2016  . Chest pain   . Essential hypertension   . Orthostatic hypotension   . Spinal stenosis of lumbar region 01/20/2016    Class: Chronic  . DDD (degenerative disc disease), lumbar 01/20/2016    Class: Chronic  . Spinal stenosis of lumbar region with neurogenic claudication 01/20/2016  . Low back pain 12/22/2015  . Pre-op examination 12/22/2015  . OSA (obstructive sleep apnea) 10/13/2015  . Muscle cramp 08/01/2015  . GERD (gastroesophageal reflux disease) 07/09/2015  . Excessive daytime sleepiness 06/18/2015  . Shingles 08/28/2013  . Postop check 08/28/2013  . Cholelithiasis 07/10/2013  . Colitis 01/08/2013  . Postoperative anemia 01/08/2013  . Morbid obesity with BMI of 40.0-44.9, adult ([place]) 01/08/2013   Past Medical History:  Diagnosis Date  . Allergy   . Anemia    Iron deficiency  . Anxiety   . Arthritis    back, left shoulder  . Chicken pox   . Depression   . Diarrhea    takes Imodium daily  . GERD (gastroesophageal reflux disease)   . H/O hiatal hernia   . Headache(784.0)   . History of kidney stones    1996ish  . Hypertension   . Migraine    none for 5 years (as of 01/13/16)  . OSA (obstructive sleep  apnea) 10/13/2015   unable to get cpap, plans to get one in 2018  . Pneumonia   . Restless legs   . Scoliosis   . Shortness of breath    with exertion    Family History  Problem Relation Age of Onset  . Heart disease Father   . Heart attack Father   . Hypertension Mother   . Arthritis Mother   . Diabetes Maternal Grandmother   . Diabetes Paternal Grandmother   . Diabetes Maternal Uncle     Past Surgical History:  Procedure Laterality Date  . BACK SURGERY  07/21/1978  . CHOLECYSTECTOMY N/A 08/15/2013   Procedure: LAPAROSCOPIC CHOLECYSTECTOMY WITH INTRAOPERATIVE CHOLANGIOGRAM;  Surgeon: Gwenyth Ober, MD;  Location: [facility];   Service: General;  Laterality: N/A;  . COLONOSCOPY N/A 01/09/2013   Procedure: COLONOSCOPY;  Surgeon: Beryle Beams, MD;  Location: [facility];  Service: Endoscopy;  Laterality: N/A;  . HERNIA REPAIR Left 1981  . TONSILLECTOMY    . TUBAL LIGATION  06/1988   Social History   Occupational History  . Safety Assistant    Social History Main Topics  . Smoking status: Never Smoker  . Smokeless tobacco: Never Used  . Alcohol use 4.2 oz/week    7 Cans of beer per week     Comment: 1 beer daily  . Drug use: No  . Sexual activity: Yes    Birth control/ protection: Surgical

## 2016-02-06 NOTE — Progress Notes (Deleted)
Office Visit Note   Patient: Kristen [name]           Date of Birth: 08-Jan-1966           MRN: [id number] Visit Date: 02/06/2016              Requested by: [address], [place], [place] [id number] PCP: Mauricio Po, FNP   Assessment & Plan: Visit Diagnoses:  1. Spinal stenosis of lumbar region with neurogenic claudication     Plan: ***  Follow-Up Instructions: No Follow-up on file.   Orders:  Orders Placed This Encounter  Procedures  . XR Lumbar Spine 2-3 Views   No orders of the defined types were placed in this encounter.     Procedures: No procedures performed   Clinical Data: No additional findings.   Subjective: Chief Complaint  Patient presents with  . Lower Back - Follow-up, Routine Post Op    HPI  Review of Systems   Objective: Vital Signs: BP 126/79 (BP Location: Left Arm, Patient Position: Sitting)   Pulse 65   Ht 5' 9"  (1.753 m)   Wt 267 lb (121.1 kg)   BMI 39.43 kg/m   Physical Exam  Ortho Exam  Specialty Comments:  No specialty comments available.  Imaging: No results found.   PMFS History: Patient Active Problem List   Diagnosis Date Noted  . Hypocalcemia 01/22/2016  . Thrombocytopenia ([place]) 01/22/2016  . Chronic diarrhea 01/22/2016  . Chest pain   . Essential hypertension   . Orthostatic hypotension   . Spinal stenosis of lumbar region 01/20/2016    Class: Chronic  . DDD (degenerative disc disease), lumbar 01/20/2016    Class: Chronic  . Spinal stenosis of lumbar region with neurogenic claudication 01/20/2016  . Low back pain 12/22/2015  . Pre-op examination 12/22/2015  . OSA (obstructive sleep apnea) 10/13/2015  . Muscle cramp 08/01/2015  . GERD (gastroesophageal reflux disease) 07/09/2015  . Excessive daytime sleepiness 06/18/2015  . Shingles 08/28/2013  . Postop check 08/28/2013  . Cholelithiasis 07/10/2013  . Colitis 01/08/2013  . Postoperative anemia 01/08/2013  . Morbid obesity  with BMI of 40.0-44.9, adult ([place]) 01/08/2013   Past Medical History:  Diagnosis Date  . Allergy   . Anemia    Iron deficiency  . Anxiety   . Arthritis    back, left shoulder  . Chicken pox   . Depression   . Diarrhea    takes Imodium daily  . GERD (gastroesophageal reflux disease)   . H/O hiatal hernia   . Headache(784.0)   . History of kidney stones    1996ish  . Hypertension   . Migraine    none for 5 years (as of 01/13/16)  . OSA (obstructive sleep apnea) 10/13/2015   unable to get cpap, plans to get one in 2018  . Pneumonia   . Restless legs   . Scoliosis   . Shortness of breath    with exertion    Family History  Problem Relation Age of Onset  . Heart disease Father   . Heart attack Father   . Hypertension Mother   . Arthritis Mother   . Diabetes Maternal Grandmother   . Diabetes Paternal Grandmother   . Diabetes Maternal Uncle     Past Surgical History:  Procedure Laterality Date  . BACK SURGERY  07/21/1978  . CHOLECYSTECTOMY N/A 08/15/2013   Procedure: LAPAROSCOPIC CHOLECYSTECTOMY WITH INTRAOPERATIVE CHOLANGIOGRAM;  Surgeon: Gwenyth Ober, MD;  Location: [facility];  Service: General;  Laterality: N/A;  . COLONOSCOPY N/A 01/09/2013   Procedure: COLONOSCOPY;  Surgeon: Beryle Beams, MD;  Location: [facility];  Service: Endoscopy;  Laterality: N/A;  . HERNIA REPAIR Left 1981  . TONSILLECTOMY    . TUBAL LIGATION  06/1988   Social History   Occupational History  . Safety Assistant    Social History Main Topics  . Smoking status: Never Smoker  . Smokeless tobacco: Never Used  . Alcohol use 4.2 oz/week    7 Cans of beer per week     Comment: 1 beer daily  . Drug use: No  . Sexual activity: Yes    Birth control/ protection: Surgical

## 2016-02-10 ENCOUNTER — Ambulatory Visit (INDEPENDENT_AMBULATORY_CARE_PROVIDER_SITE_OTHER): Payer: BC Managed Care – PPO | Admitting: Family

## 2016-02-10 ENCOUNTER — Encounter: Payer: Self-pay | Admitting: Family

## 2016-02-10 DIAGNOSIS — I10 Essential (primary) hypertension: Secondary | ICD-10-CM | POA: Diagnosis not present

## 2016-02-10 MED ORDER — LISINOPRIL 40 MG PO TABS: 40.0000 mg | ORAL_TABLET | Freq: Every day | ORAL | 1 refills | Status: DC

## 2016-02-10 NOTE — Progress Notes (Signed)
Subjective:    Patient ID: Kristen [name], female    DOB: 05/16/1965, 51 y.o.   MRN: [id number]  Chief Complaint  Patient presents with  . Follow-up    follow up on surgery, BP check    HPI:  Kristen [name] is a 51 y.o. female who  has a past medical history of Allergy; Anemia; Anxiety; Arthritis; Chicken pox; Depression; Diarrhea; GERD (gastroesophageal reflux disease); H/O hiatal hernia; Headache(784.0); History of kidney stones; Hypertension; Migraine; OSA (obstructive sleep apnea) (10/13/2015); Pneumonia; Restless legs; Scoliosis; and Shortness of breath. and presents today for an a follow up office visit.   Hypertension - Currently maintained on lisinopril. Reports taking the medication as prescribed and denies adverse side effects. Blood pressures at home have been averaging around 140's / 90s.  Denies worst headache of life with no symptoms of end organ damage. Not currently following a low sodium diet.    BP Readings from Last 3 Encounters:  02/10/16 (!) 154/98  02/06/16 126/79  01/26/16 140/88      Allergies  Allergen Reactions  . Other Hives and Swelling    tomato  . Sulfur Hives and Swelling      Outpatient Medications Prior to Visit  Medication Sig Dispense Refill  . Bismuth Subsalicylate 109 MG TABS Take 2 tablets by mouth every 8 (eight) hours as needed (for upset stomach/diarrhea).     . diclofenac (VOLTAREN) 75 MG EC tablet Take 75 mg by mouth 2 (two) times daily as needed (for pain.).     Marland Kitchen gabapentin (NEURONTIN) 300 MG capsule Take 300 mg by mouth 3 (three) times daily.    Marland Kitchen loperamide (IMODIUM) 2 MG capsule Take 2-4 mg by mouth 2 (two) times daily as needed for diarrhea or loose stools.     Marland Kitchen oxyCODONE-acetaminophen (PERCOCET/ROXICET) 5-325 MG tablet Take 1-2 tablets by mouth every 4 (four) hours as needed for moderate pain. 60 tablet 0  . pantoprazole (PROTONIX) 40 MG tablet Take 1 tablet (40 mg total) by mouth daily. 30 tablet 3  . predniSONE  (DELTASONE) 10 MG tablet Take 4 tablets po 01/27/2016, Take 3 and 1/2 tablets po 01/28/2016, Take 3 tablets po 1/11/2018m Take 2 and 1/2 tablets po 01/30/2016, Take 2 tablets po 01/31/2016, Take 1 and 1/2 tablets po 02/01/2016, Take 1 tablet po 02/02/2016, Then take 1/2 tablet po on 02/03/2016. 18 tablet 0  . predniSONE (DELTASONE) 5 MG tablet Take 8 tablets (40 mg total) by mouth daily with breakfast. 36 tablet 0  . tiZANidine (ZANAFLEX) 4 MG tablet Take 1 tablet (4 mg total) by mouth every 6 (six) hours as needed for muscle spasms. 60 tablet 1  . lisinopril (PRINIVIL,ZESTRIL) 20 MG tablet Take 1 tablet (20 mg total) by mouth daily. 90 tablet 3   No facility-administered medications prior to visit.      Review of Systems  Constitutional: Negative for chills and fever.  Eyes:       Negative for changes in vision  Respiratory: Negative for cough, chest tightness and wheezing.   Cardiovascular: Negative for chest pain, palpitations and leg swelling.  Neurological: Negative for dizziness, weakness and light-headedness.      Objective:    BP (!) 154/98 (BP Location: Left Arm, Patient Position: Sitting, Cuff Size: Large)   Pulse 60   Temp 98.2 F (36.8 C) (Oral)   Resp 16   Ht 5' 9"  (1.753 m)   Wt 273 lb (123.8 kg)   SpO2 97%   BMI  40.32 kg/m  Nursing note and vital signs reviewed.  Physical Exam  Constitutional: She is oriented to person, place, and time. She appears well-developed and well-nourished. No distress.  Cardiovascular: Normal rate, regular rhythm, normal heart sounds and intact distal pulses.   Pulmonary/Chest: Effort normal and breath sounds normal.  Neurological: She is alert and oriented to person, place, and time.  Skin: Skin is warm and dry.  Psychiatric: She has a normal mood and affect. Her behavior is normal. Judgment and thought content normal.       Assessment & Plan:   Problem List Items Addressed This Visit      Cardiovascular and Mediastinum   Essential  hypertension (Chronic)    Blood pressure remains elevated above goal 140/90 with current regimen and no adverse side effects or hypotensive readings. Denies worst headache of life with no new symptoms of end organ damage related to blood pressure noted on physical exam. Increase lisinopril to 40 mg daily. Continue to monitor blood pressure at home and follow sodium diet. Follow-up in one month or sooner if needed.      Relevant Medications   lisinopril (PRINIVIL,ZESTRIL) 40 MG tablet       I have changed Ms. Napierkowski's lisinopril. I am also having her maintain her Bismuth Subsalicylate, diclofenac, loperamide, gabapentin, pantoprazole, tiZANidine, predniSONE, predniSONE, and oxyCODONE-acetaminophen.   Meds ordered this encounter  Medications  . lisinopril (PRINIVIL,ZESTRIL) 40 MG tablet    Sig: Take 1 tablet (40 mg total) by mouth daily.    Dispense:  30 tablet    Refill:  1    Order Specific Question:   Supervising Provider    Answer:   Pricilla Holm A [[id number]]     Follow-up: Return in about 1 month (around 03/12/2016), or if symptoms worsen or fail to improve.  Mauricio Po, FNP

## 2016-02-10 NOTE — Assessment & Plan Note (Addendum)
Blood pressure remains elevated above goal 140/90 with current regimen and no adverse side effects or hypotensive readings. Denies worst headache of life with no new symptoms of end organ damage related to blood pressure noted on physical exam. Increase lisinopril to 40 mg daily. Continue to monitor blood pressure at home and follow sodium diet. Follow-up in one month or sooner if needed.

## 2016-02-10 NOTE — Patient Instructions (Addendum)
Thank you for choosing [facility].  SUMMARY AND INSTRUCTIONS:  Continue to monitor your blood pressure at home.  Medication:  Please increase your lisinopril to 40 mg daily. If you develop a cough please let us know.   Your prescription(s) have been submitted to your pharmacy or been printed and provided for you. Please take as directed and contact our office if you believe you are having problem(s) with the medication(s) or have any questions.  Follow up:  If your symptoms worsen or fail to improve, please contact our office for further instruction, or in case of emergency go directly to the emergency room at the closest medical facility.

## 2016-02-11 ENCOUNTER — Other Ambulatory Visit: Payer: Self-pay

## 2016-02-11 ENCOUNTER — Other Ambulatory Visit: Payer: Self-pay | Admitting: *Deleted

## 2016-02-11 DIAGNOSIS — K2100 Gastro-esophageal reflux disease with esophagitis, without bleeding: Secondary | ICD-10-CM

## 2016-02-11 DIAGNOSIS — I10 Essential (primary) hypertension: Secondary | ICD-10-CM

## 2016-02-11 MED ORDER — LISINOPRIL 40 MG PO TABS: 40.0000 mg | ORAL_TABLET | Freq: Every day | ORAL | 1 refills | Status: DC

## 2016-02-11 MED ORDER — PANTOPRAZOLE SODIUM 40 MG PO TBEC: 40.0000 mg | DELAYED_RELEASE_TABLET | Freq: Every day | ORAL | 3 refills | Status: DC

## 2016-02-16 ENCOUNTER — Ambulatory Visit (HOSPITAL_COMMUNITY)
Admission: EM | Admit: 2016-02-16 | Discharge: 2016-02-16 | Disposition: A | Discharge status: Home / Self Care | Payer: BC Managed Care – PPO | Attending: Emergency Medicine | Admitting: Emergency Medicine

## 2016-02-16 ENCOUNTER — Ambulatory Visit (INDEPENDENT_AMBULATORY_CARE_PROVIDER_SITE_OTHER): Payer: BC Managed Care – PPO

## 2016-02-16 ENCOUNTER — Encounter (HOSPITAL_COMMUNITY): Payer: Self-pay | Admitting: Emergency Medicine

## 2016-02-16 DIAGNOSIS — T148XXA Other injury of unspecified body region, initial encounter: Secondary | ICD-10-CM | POA: Diagnosis not present

## 2016-02-16 NOTE — ED Provider Notes (Signed)
[facility]    CSN: [id number] Arrival date & time: 02/16/16  1642     History   Chief Complaint Chief Complaint  Patient presents with  . Back Pain    HPI Kristen [name] is a 51 y.o. female.   HPI She is a 51 year old woman here for evaluation of left side pain. This started 2 or 3 days ago. She reports a band of pain from the left flank around the left side. It is constant. It is worse with laying flat in certain movements. She had lumbar spine surgery 3 weeks ago and the pain does ease up when she takes her oxycodone.  She has had kidney stones and kidney infections in the past and states this does not feel anything like that. She denies any urinary symptoms or hematuria. No injury or trauma. No nausea, vomiting, diarrhea, or constipation. She states it feels the most like when she had shingles several years ago, but without burning or rash.  Past Medical History:  Diagnosis Date  . Allergy   . Anemia    Iron deficiency  . Anxiety   . Arthritis    back, left shoulder  . Chicken pox   . Depression   . Diarrhea    takes Imodium daily  . GERD (gastroesophageal reflux disease)   . H/O hiatal hernia   . Headache(784.0)   . History of kidney stones    1996ish  . Hypertension   . Migraine    none for 5 years (as of 01/13/16)  . OSA (obstructive sleep apnea) 10/13/2015   unable to get cpap, plans to get one in 2018  . Pneumonia   . Restless legs   . Scoliosis   . Shortness of breath    with exertion    Patient Active Problem List   Diagnosis Date Noted  . Hypocalcemia 01/22/2016  . Thrombocytopenia ([place]) 01/22/2016  . Chronic diarrhea 01/22/2016  . Chest pain   . Essential hypertension   . Orthostatic hypotension   . Spinal stenosis of lumbar region 01/20/2016    Class: Chronic  . DDD (degenerative disc disease), lumbar 01/20/2016    Class: Chronic  . Spinal stenosis of lumbar region with neurogenic claudication 01/20/2016  . Low back pain  12/22/2015  . Pre-op examination 12/22/2015  . OSA (obstructive sleep apnea) 10/13/2015  . Muscle cramp 08/01/2015  . GERD (gastroesophageal reflux disease) 07/09/2015  . Excessive daytime sleepiness 06/18/2015  . Shingles 08/28/2013  . Postop check 08/28/2013  . Cholelithiasis 07/10/2013  . Colitis 01/08/2013  . Postoperative anemia 01/08/2013  . Morbid obesity with BMI of 40.0-44.9, adult ([place]) 01/08/2013    Past Surgical History:  Procedure Laterality Date  . BACK SURGERY  07/21/1978  . CHOLECYSTECTOMY N/A 08/15/2013   Procedure: LAPAROSCOPIC CHOLECYSTECTOMY WITH INTRAOPERATIVE CHOLANGIOGRAM;  Surgeon: Gwenyth Ober, MD;  Location: [facility];  Service: General;  Laterality: N/A;  . COLONOSCOPY N/A 01/09/2013   Procedure: COLONOSCOPY;  Surgeon: Beryle Beams, MD;  Location: [facility];  Service: Endoscopy;  Laterality: N/A;  . HERNIA REPAIR Left 1981  . TONSILLECTOMY    . TUBAL LIGATION  06/1988    OB History    No data available       Home Medications    Prior to Admission medications   Medication Sig Start Date End Date Taking? Authorizing Provider  Bismuth Subsalicylate 700 MG TABS Take 2 tablets by mouth every 8 (eight) hours as needed (for upset stomach/diarrhea).  Historical Provider, MD  diclofenac (VOLTAREN) 75 MG EC tablet Take 75 mg by mouth 2 (two) times daily as needed (for pain.).     Historical Provider, MD  gabapentin (NEURONTIN) 300 MG capsule Take 300 mg by mouth 3 (three) times daily.    Historical Provider, MD  lisinopril (PRINIVIL,ZESTRIL) 40 MG tablet Take 1 tablet (40 mg total) by mouth daily. 02/11/16   Golden Circle, FNP  loperamide (IMODIUM) 2 MG capsule Take 2-4 mg by mouth 2 (two) times daily as needed for diarrhea or loose stools.     Historical Provider, MD  oxyCODONE-acetaminophen (PERCOCET/ROXICET) 5-325 MG tablet Take 1-2 tablets by mouth every 4 (four) hours as needed for moderate pain. 02/06/16   Jessy Oto, MD  pantoprazole (PROTONIX)  40 MG tablet Take 1 tablet (40 mg total) by mouth daily. 02/11/16   Golden Circle, FNP  predniSONE (DELTASONE) 10 MG tablet Take 4 tablets po 01/27/2016, Take 3 and 1/2 tablets po 01/28/2016, Take 3 tablets po 1/11/2018m Take 2 and 1/2 tablets po 01/30/2016, Take 2 tablets po 01/31/2016, Take 1 and 1/2 tablets po 02/01/2016, Take 1 tablet po 02/02/2016, Then take 1/2 tablet po on 02/03/2016. 01/26/16   Jessy Oto, MD  predniSONE (DELTASONE) 5 MG tablet Take 8 tablets (40 mg total) by mouth daily with breakfast. 01/26/16   Robbie Lis, MD  tiZANidine (ZANAFLEX) 4 MG tablet Take 1 tablet (4 mg total) by mouth every 6 (six) hours as needed for muscle spasms. 01/26/16   Jessy Oto, MD    Family History Family History  Problem Relation Age of Onset  . Heart disease Father   . Heart attack Father   . Hypertension Mother   . Arthritis Mother   . Diabetes Maternal Grandmother   . Diabetes Paternal Grandmother   . Diabetes Maternal Uncle     Social History Social History  Substance Use Topics  . Smoking status: Never Smoker  . Smokeless tobacco: Never Used  . Alcohol use 4.2 oz/week    7 Cans of beer per week     Comment: 1 beer daily     Allergies   Other and Sulfur   Review of Systems Review of Systems As in history of present illness  Physical Exam Triage Vital Signs ED Triage Vitals [02/16/16 1724]  Enc Vitals Group     BP (!) 154/115     Pulse Rate 83     Resp 18     Temp 98.9 F (37.2 C)     Temp Source Oral     SpO2 96 %     Weight      Height      Head Circumference      Peak Flow      Pain Score      Pain Loc      Pain Edu?      Excl. in [place]?    No data found.   Updated Vital Signs BP (!) 154/115 (BP Location: Left Arm) Comment (BP Location): large cuff  Pulse 83   Temp 98.9 F (37.2 C) (Oral)   Resp 18   SpO2 96%   Visual Acuity Right Eye Distance:   Left Eye Distance:   Bilateral Distance:    Right Eye Near:   Left Eye Near:    Bilateral Near:      Physical Exam  Constitutional: She is oriented to person, place, and time. She appears well-developed and well-nourished. No distress.  Looks uncomfortable  Cardiovascular: Normal rate.   Pulmonary/Chest: Effort normal.  Abdominal: Soft. She exhibits no distension. There is no tenderness.  Musculoskeletal:  Back: No erythema or edema. She does have a healing midline surgical scar over the lumbar spine. She is slightly tender over the floating ribs. No skin changes.  Neurological: She is alert and oriented to person, place, and time.  Skin: No rash noted.     UC Treatments / Results  Labs (all labs ordered are listed, but only abnormal results are displayed) Labs Reviewed - No data to display  EKG  EKG Interpretation None       Radiology Dg Abd Acute W/chest  Result Date: 02/16/2016 CLINICAL DATA:  Left flank pain for 3 days EXAM: DG ABDOMEN ACUTE W/ 1V CHEST COMPARISON:  01/23/2016 CT chest FINDINGS: The heart is moderately enlarged. Vascularity is unremarkable. Low volumes and bibasilar atelectasis. There are no disproportionally dilated loops of bowel. There is no free intraperitoneal gas. There stabilization hardware in the thoracolumbar spine. Levoscoliosis at the thoracolumbar junction is noted. IMPRESSION: Nonobstructive bowel gas pattern.  Bibasilar atelectasis. Electronically Signed   By: Marybelle Killings M.D.   On: 02/16/2016 18:04    Procedures Procedures (including critical care time)  Medications Ordered in UC Medications - No data to display   Initial Impression / Assessment and Plan / UC Course  I have reviewed the triage vital signs and the nursing notes.  Pertinent labs & imaging results that were available during my care of the patient were reviewed by me and considered in my medical decision making (see chart for details).     No red flags. X-ray normal. I suspect this is a pulled or strained muscle. She will continue her home pain medications which  include diclofenac, oxycodone, tizanidine, and gabapentin. I recommended alternating ice and heat to the affected area. If symptoms are worsening, she will follow-up with her surgeon or the emergency room.  Final Clinical Impressions(s) / UC Diagnoses   Final diagnoses:  Muscle strain    New Prescriptions New Prescriptions   No medications on file     Melony Overly, MD 02/16/16 1815

## 2016-02-16 NOTE — Discharge Instructions (Signed)
Your x-rays are normal. I suspect this is a pulled or strained muscle. Continue your home pain medications. Alternate ice and heat to the area. If things are getting worse, please go to the emergency room or follow-up with your surgeon for evaluation.

## 2016-02-16 NOTE — ED Triage Notes (Signed)
Patient holds left flank/back area as source of pain.  No known injury.  Denies urinary symptoms.  Normal bm today

## 2016-02-27 ENCOUNTER — Telehealth (INDEPENDENT_AMBULATORY_CARE_PROVIDER_SITE_OTHER): Payer: Self-pay | Admitting: Specialist

## 2016-02-27 NOTE — Telephone Encounter (Signed)
Patient is requesting a tramadol refill she uses [facility] in [place]

## 2016-03-01 ENCOUNTER — Other Ambulatory Visit (INDEPENDENT_AMBULATORY_CARE_PROVIDER_SITE_OTHER): Payer: Self-pay | Admitting: Specialist

## 2016-03-01 MED ORDER — TRAMADOL HCL 50 MG PO TABS: 100.0000 mg | ORAL_TABLET | Freq: Four times a day (QID) | ORAL | 0 refills | Status: DC | PRN

## 2016-03-01 NOTE — Telephone Encounter (Signed)
Rx for tramadol printed and signed for patient, will need to be called to her pharmacy.

## 2016-03-02 NOTE — Progress Notes (Signed)
I called and advised patient that her rx was ready for pick up at our front desk.

## 2016-03-02 NOTE — Telephone Encounter (Signed)
I called and advised patient that her rx was ready for pick up at our front desk.

## 2016-03-04 ENCOUNTER — Encounter (INDEPENDENT_AMBULATORY_CARE_PROVIDER_SITE_OTHER): Payer: Self-pay | Admitting: Specialist

## 2016-03-05 ENCOUNTER — Encounter (INDEPENDENT_AMBULATORY_CARE_PROVIDER_SITE_OTHER): Payer: Self-pay | Admitting: Specialist

## 2016-03-11 ENCOUNTER — Ambulatory Visit (INDEPENDENT_AMBULATORY_CARE_PROVIDER_SITE_OTHER): Payer: Self-pay

## 2016-03-11 ENCOUNTER — Ambulatory Visit (INDEPENDENT_AMBULATORY_CARE_PROVIDER_SITE_OTHER): Payer: BC Managed Care – PPO | Admitting: Specialist

## 2016-03-11 ENCOUNTER — Encounter (INDEPENDENT_AMBULATORY_CARE_PROVIDER_SITE_OTHER): Payer: Self-pay | Admitting: Specialist

## 2016-03-11 DIAGNOSIS — M48062 Spinal stenosis, lumbar region with neurogenic claudication: Secondary | ICD-10-CM

## 2016-03-11 DIAGNOSIS — Z981 Arthrodesis status: Secondary | ICD-10-CM

## 2016-03-11 MED ORDER — TIZANIDINE HCL 4 MG PO TABS: 4.0000 mg | ORAL_TABLET | Freq: Four times a day (QID) | ORAL | 1 refills | Status: DC | PRN

## 2016-03-11 NOTE — Patient Instructions (Signed)
Avoid frequent bending and stooping  No lifting greater than 10 lbs. May use ice or moist heat for pain. Weight loss is of benefit. Handicap license is approved.   

## 2016-03-11 NOTE — Progress Notes (Signed)
Post-Op Visit Note   Patient: Kristen [name]           Date of Birth: 09/17/1965           MRN: [id number] Visit Date: 03/11/2016 PCP: Mauricio Po, FNP   Assessment & Plan:Post op lumbar fusion L3-4  And L4-5 doing well, taking only tramadol and tizanidine, returning to work on 03/15/2016  Chief Complaint:  Kristen [name] is here for 7 week 2 day post op for removal of rod at right L2 to L3, Transforaminal lumbar interbody fusion right L3-4 and right L4-5 with pedicle screws and rods L2, L3, L4 and L5, local bone graft, allograft and Vivigen. She states that she is doing really good.  She does have some aches but nothing major.   Visit Diagnoses:  1. Spinal stenosis of lumbar region with neurogenic claudication   2. Status post lumbar spinal fusion     Plan: Avoid frequent bending and stooping  No lifting greater than 10 lbs. May use ice or moist heat for pain. Weight loss is of benefit. Handicap license is approved.   Follow-Up Instructions: Return in about 4 weeks (around 04/08/2016).   Orders:  Orders Placed This Encounter  Procedures  . XR Lumbar Spine 2-3 Views   Meds ordered this encounter  Medications  . tiZANidine (ZANAFLEX) 4 MG tablet    Sig: Take 1 tablet (4 mg total) by mouth every 6 (six) hours as needed for muscle spasms.    Dispense:  90 tablet    Refill:  1    Imaging: Xr Lumbar Spine 2-3 Views  Result Date: 03/11/2016 Lumbar spine AP and lateral views show a retained Harrington rod and upper hook fixing a left thoracic scoliosis to the L2 level the lower rod has been removed, there are pedicle screws and rods with cages at L3-4 and L4-5.   PMFS History: Patient Active Problem List   Diagnosis Date Noted  . Spinal stenosis of lumbar region 01/20/2016    Priority: High    Class: Chronic  . DDD (degenerative disc disease), lumbar 01/20/2016    Priority: High    Class: Chronic  . Hypocalcemia 01/22/2016  . Thrombocytopenia ([place]) 01/22/2016  .  Chronic diarrhea 01/22/2016  . Chest pain   . Essential hypertension   . Orthostatic hypotension   . Spinal stenosis of lumbar region with neurogenic claudication 01/20/2016  . Low back pain 12/22/2015  . Pre-op examination 12/22/2015  . OSA (obstructive sleep apnea) 10/13/2015  . Muscle cramp 08/01/2015  . GERD (gastroesophageal reflux disease) 07/09/2015  . Excessive daytime sleepiness 06/18/2015  . Shingles 08/28/2013  . Postop check 08/28/2013  . Cholelithiasis 07/10/2013  . Colitis 01/08/2013  . Postoperative anemia 01/08/2013  . Morbid obesity with BMI of 40.0-44.9, adult ([place]) 01/08/2013   Past Medical History:  Diagnosis Date  . Allergy   . Anemia    Iron deficiency  . Anxiety   . Arthritis    back, left shoulder  . Chicken pox   . Depression   . Diarrhea    takes Imodium daily  . GERD (gastroesophageal reflux disease)   . H/O hiatal hernia   . Headache(784.0)   . History of kidney stones    1996ish  . Hypertension   . Migraine    none for 5 years (as of 01/13/16)  . OSA (obstructive sleep apnea) 10/13/2015   unable to get cpap, plans to get one in 2018  . Pneumonia   . Restless  legs   . Scoliosis   . Shortness of breath    with exertion    Family History  Problem Relation Age of Onset  . Heart disease Father   . Heart attack Father   . Hypertension Mother   . Arthritis Mother   . Diabetes Maternal Grandmother   . Diabetes Paternal Grandmother   . Diabetes Maternal Uncle     Past Surgical History:  Procedure Laterality Date  . BACK SURGERY  07/21/1978  . CHOLECYSTECTOMY N/A 08/15/2013   Procedure: LAPAROSCOPIC CHOLECYSTECTOMY WITH INTRAOPERATIVE CHOLANGIOGRAM;  Surgeon: Gwenyth Ober, MD;  Location: [facility];  Service: General;  Laterality: N/A;  . COLONOSCOPY N/A 01/09/2013   Procedure: COLONOSCOPY;  Surgeon: Beryle Beams, MD;  Location: [facility];  Service: Endoscopy;  Laterality: N/A;  . HERNIA REPAIR Left 1981  . TONSILLECTOMY    . TUBAL  LIGATION  06/1988   Social History   Occupational History  . Safety Assistant    Social History Main Topics  . Smoking status: Never Smoker  . Smokeless tobacco: Never Used  . Alcohol use 4.2 oz/week    7 Cans of beer per week     Comment: 1 beer daily  . Drug use: No  . Sexual activity: Yes    Birth control/ protection: Surgical

## 2016-03-12 ENCOUNTER — Encounter: Payer: Self-pay | Admitting: Family

## 2016-03-12 ENCOUNTER — Telehealth (INDEPENDENT_AMBULATORY_CARE_PROVIDER_SITE_OTHER): Payer: Self-pay | Admitting: Specialist

## 2016-03-12 ENCOUNTER — Ambulatory Visit (INDEPENDENT_AMBULATORY_CARE_PROVIDER_SITE_OTHER): Payer: BC Managed Care – PPO | Admitting: Family

## 2016-03-12 VITALS — BP 128/82 | HR 64 | Resp 16 | Ht 69.0 in | Wt 268.0 lb

## 2016-03-12 DIAGNOSIS — I10 Essential (primary) hypertension: Secondary | ICD-10-CM

## 2016-03-12 DIAGNOSIS — R195 Other fecal abnormalities: Secondary | ICD-10-CM | POA: Insufficient documentation

## 2016-03-12 MED ORDER — ELUXADOLINE 75 MG PO TABS
75.0000 mg | ORAL_TABLET | Freq: Two times a day (BID) | ORAL | 0 refills | Status: DC
Start: 2016-03-12 — End: 2016-03-15

## 2016-03-12 NOTE — Progress Notes (Signed)
Subjective:    Patient ID: Kristen [name], female    DOB: 09/15/1965, 51 y.o.   MRN: [id number]  Chief Complaint  Patient presents with  . Follow-up    BP    HPI:  Kristen [name] is a 51 y.o. female who  has a past medical history of Allergy; Anemia; Anxiety; Arthritis; Chicken pox; Depression; Diarrhea; GERD (gastroesophageal reflux disease); H/O hiatal hernia; Headache(784.0); History of kidney stones; Hypertension; Migraine; OSA (obstructive sleep apnea) (10/13/2015); Pneumonia; Restless legs; Scoliosis; and Shortness of breath. and presents today for a follow up office visit.   1.) Hypertension - Currently maintained on lisinopril. Reports taking the medication as prescribed and denies adverse side effects or cough. Blood pressures at home have remained well controlled. Denies worst headache of life with no symptoms of end organ damage. Working on following a low-sodium diet.   BP Readings from Last 3 Encounters:  03/12/16 128/82  02/16/16 (!) 154/115  02/10/16 (!) 154/98   2.) Increased gas / diarrhea - Associated symptoms of increased gas and loose stools with abdominal cramping have been waxing and waning for approximately 2 years. Modifying factors include Imodium which does help with her symptoms, however these her slightly constipated. Endorses a frequency of 1 bowel movement per day that is generally loose. Indicates some abdominal cramping which is relieved by a bowel movement. Denies nausea, vomiting, or blood in stool. Eating a balanced nutritional intake with unquantified amount of fiber. Most recently on narcotics secondary to recent back surgery and maintained on tramadol currently.   Allergies  Allergen Reactions  . Other Hives and Swelling    tomato  . Sulfur Hives and Swelling      Outpatient Medications Prior to Visit  Medication Sig Dispense Refill  . diclofenac (VOLTAREN) 75 MG EC tablet Take 75 mg by mouth 2 (two) times daily as needed (for pain.).      Marland Kitchen gabapentin (NEURONTIN) 300 MG capsule Take 300 mg by mouth 3 (three) times daily.    Marland Kitchen lisinopril (PRINIVIL,ZESTRIL) 40 MG tablet Take 1 tablet (40 mg total) by mouth daily. 90 tablet 1  . loperamide (IMODIUM) 2 MG capsule Take 2-4 mg by mouth 2 (two) times daily as needed for diarrhea or loose stools.     . pantoprazole (PROTONIX) 40 MG tablet Take 1 tablet (40 mg total) by mouth daily. 30 tablet 3  . predniSONE (DELTASONE) 10 MG tablet Take 4 tablets po 01/27/2016, Take 3 and 1/2 tablets po 01/28/2016, Take 3 tablets po 1/11/2018m Take 2 and 1/2 tablets po 01/30/2016, Take 2 tablets po 01/31/2016, Take 1 and 1/2 tablets po 02/01/2016, Take 1 tablet po 02/02/2016, Then take 1/2 tablet po on 02/03/2016. 18 tablet 0  . predniSONE (DELTASONE) 5 MG tablet Take 8 tablets (40 mg total) by mouth daily with breakfast. 36 tablet 0  . tiZANidine (ZANAFLEX) 4 MG tablet Take 1 tablet (4 mg total) by mouth every 6 (six) hours as needed for muscle spasms. 90 tablet 1  . traMADol (ULTRAM) 50 MG tablet Take 2 tablets (100 mg total) by mouth every 6 (six) hours as needed. 60 tablet 0  . Bismuth Subsalicylate 756 MG TABS Take 2 tablets by mouth every 8 (eight) hours as needed (for upset stomach/diarrhea).     . oxyCODONE-acetaminophen (PERCOCET/ROXICET) 5-325 MG tablet Take 1-2 tablets by mouth every 4 (four) hours as needed for moderate pain. 60 tablet 0   No facility-administered medications prior to visit.  Past Surgical History:  Procedure Laterality Date  . BACK SURGERY  07/21/1978  . CHOLECYSTECTOMY N/A 08/15/2013   Procedure: LAPAROSCOPIC CHOLECYSTECTOMY WITH INTRAOPERATIVE CHOLANGIOGRAM;  Surgeon: Gwenyth Ober, MD;  Location: [facility];  Service: General;  Laterality: N/A;  . COLONOSCOPY N/A 01/09/2013   Procedure: COLONOSCOPY;  Surgeon: Beryle Beams, MD;  Location: [facility];  Service: Endoscopy;  Laterality: N/A;  . HERNIA REPAIR Left 1981  . TONSILLECTOMY    . TUBAL LIGATION  06/1988      Past  Medical History:  Diagnosis Date  . Allergy   . Anemia    Iron deficiency  . Anxiety   . Arthritis    back, left shoulder  . Chicken pox   . Depression   . Diarrhea    takes Imodium daily  . GERD (gastroesophageal reflux disease)   . H/O hiatal hernia   . Headache(784.0)   . History of kidney stones    1996ish  . Hypertension   . Migraine    none for 5 years (as of 01/13/16)  . OSA (obstructive sleep apnea) 10/13/2015   unable to get cpap, plans to get one in 2018  . Pneumonia   . Restless legs   . Scoliosis   . Shortness of breath    with exertion      Review of Systems  Constitutional: Negative for chills and fever.  Eyes:       Negative for changes in vision  Respiratory: Negative for cough, chest tightness and wheezing.   Cardiovascular: Negative for chest pain, palpitations and leg swelling.  Gastrointestinal: Positive for abdominal pain and diarrhea. Negative for abdominal distention, anal bleeding, blood in stool, constipation, nausea, rectal pain and vomiting.  Neurological: Negative for dizziness, weakness and light-headedness.      Objective:    BP 128/82 (BP Location: Left Arm, Patient Position: Sitting, Cuff Size: Large)   Pulse 64   Resp 16   Ht 5' 9"  (1.753 m)   Wt 268 lb (121.6 kg)   SpO2 97%   BMI 39.58 kg/m  Nursing note and vital signs reviewed.  Physical Exam  Constitutional: She is oriented to person, place, and time. She appears well-developed and well-nourished. No distress.  Cardiovascular: Normal rate, regular rhythm, normal heart sounds and intact distal pulses.   Pulmonary/Chest: Effort normal and breath sounds normal.  Abdominal: Normal appearance and bowel sounds are normal. She exhibits no distension and no mass. There is no hepatosplenomegaly. There is no tenderness. There is no rigidity, no rebound, no guarding, no tenderness at McBurney's point and negative Murphy's sign.  Neurological: She is alert and oriented to person,  place, and time.  Skin: Skin is warm and dry.  Psychiatric: She has a normal mood and affect. Her behavior is normal. Judgment and thought content normal.       Assessment & Plan:   Problem List Items Addressed This Visit      Cardiovascular and Mediastinum   Essential hypertension - Primary (Chronic)    Hypertension appears adequately controlled blood goal 140/90 with current regimen and no adverse side effects. Denies worst headache of life with no new symptoms of end organ damage noted on physical exam. Continue current dosage of lisinopril. Encouraged monitor blood pressure at home and follow low-sodium diet. Continue to monitor.        Other   Loose stools    Loose stools remain labile with concern for diarrhea versus early bowel syndrome. Not likely infectious. Trial  Viberzi. Recommend probiotic and increasing fiber in diet or Metamucil/Benefiber. Continue to monitor pending trial of medications and dietary changes.      Relevant Medications   Eluxadoline (VIBERZI) 75 MG TABS       I have discontinued Ms. Holten's Bismuth Subsalicylate and oxyCODONE-acetaminophen. I am also having her start on Eluxadoline. Additionally, I am having her maintain her diclofenac, loperamide, gabapentin, predniSONE, predniSONE, lisinopril, pantoprazole, traMADol, and tiZANidine.   Meds ordered this encounter  Medications  . Eluxadoline (VIBERZI) 75 MG TABS    Sig: Take 75 mg by mouth 2 (two) times daily.    Dispense:  8 tablet    Refill:  0    Order Specific Question:   Supervising Provider    Answer:   Pricilla Holm A [[id number]]     Follow-up: Return if symptoms worsen or fail to improve.  Mauricio Po, FNP

## 2016-03-12 NOTE — Assessment & Plan Note (Addendum)
Loose stools remain labile with concern for diarrhea versus early bowel syndrome. Not likely infectious. Trial Viberzi. Recommend probiotic and increasing fiber in diet or Metamucil/Benefiber. Continue to monitor pending trial of medications and dietary changes.

## 2016-03-12 NOTE — Assessment & Plan Note (Signed)
Hypertension appears adequately controlled blood goal 140/90 with current regimen and no adverse side effects. Denies worst headache of life with no new symptoms of end organ damage noted on physical exam. Continue current dosage of lisinopril. Encouraged monitor blood pressure at home and follow low-sodium diet. Continue to monitor.

## 2016-03-12 NOTE — Patient Instructions (Signed)
Thank you for choosing [facility].  SUMMARY AND INSTRUCTIONS:  Medication:  Start taking the Viberzi to help with the cramping and loose stools.   Gas-X or Gaviscon for bloating and gas as needed.  Increase fiber with Benefiber or Metamucil.   Your prescription(s) have been submitted to your pharmacy or been printed and provided for you. Please take as directed and contact our office if you believe you are having problem(s) with the medication(s) or have any questions.  Follow up:  If your symptoms worsen or fail to improve, please contact our office for further instruction, or in case of emergency go directly to the emergency room at the closest medical facility.

## 2016-03-12 NOTE — Telephone Encounter (Signed)
Patient needs a note for work stating she can go back to work with no restrictions. Fax # to her [phone]

## 2016-03-15 ENCOUNTER — Encounter: Payer: Self-pay | Admitting: Family

## 2016-03-15 DIAGNOSIS — R195 Other fecal abnormalities: Secondary | ICD-10-CM

## 2016-03-15 MED ORDER — ELUXADOLINE 75 MG PO TABS: 75.0000 mg | ORAL_TABLET | Freq: Two times a day (BID) | ORAL | 0 refills | Status: DC

## 2016-03-15 NOTE — Telephone Encounter (Signed)
I faxed note for patient to her employer

## 2016-03-17 ENCOUNTER — Other Ambulatory Visit (INDEPENDENT_AMBULATORY_CARE_PROVIDER_SITE_OTHER): Payer: Self-pay | Admitting: Specialist

## 2016-03-17 NOTE — Telephone Encounter (Signed)
Tizanidine 79m Refill request

## 2016-03-31 ENCOUNTER — Other Ambulatory Visit (INDEPENDENT_AMBULATORY_CARE_PROVIDER_SITE_OTHER): Payer: Self-pay | Admitting: Specialist

## 2016-04-02 ENCOUNTER — Encounter: Payer: Self-pay | Admitting: Family

## 2016-04-04 ENCOUNTER — Encounter: Payer: Self-pay | Admitting: Family

## 2016-04-05 MED ORDER — TRAMADOL HCL 50 MG PO TABS: 100.0000 mg | ORAL_TABLET | Freq: Four times a day (QID) | ORAL | 0 refills | Status: DC | PRN

## 2016-04-06 ENCOUNTER — Other Ambulatory Visit (INDEPENDENT_AMBULATORY_CARE_PROVIDER_SITE_OTHER): Payer: Self-pay | Admitting: Specialist

## 2016-04-06 ENCOUNTER — Other Ambulatory Visit: Payer: Self-pay | Admitting: Obstetrics & Gynecology

## 2016-04-06 DIAGNOSIS — G4482 Headache associated with sexual activity: Secondary | ICD-10-CM

## 2016-04-06 MED ORDER — TRAMADOL HCL 50 MG PO TABS: 100.0000 mg | ORAL_TABLET | Freq: Four times a day (QID) | ORAL | 0 refills | Status: DC | PRN

## 2016-04-07 NOTE — Progress Notes (Signed)
I called her rx to [facility].

## 2016-04-12 ENCOUNTER — Other Ambulatory Visit: Payer: BC Managed Care – PPO

## 2016-04-13 ENCOUNTER — Other Ambulatory Visit: Payer: Self-pay | Admitting: Obstetrics & Gynecology

## 2016-04-13 DIAGNOSIS — R519 Headache, unspecified: Secondary | ICD-10-CM

## 2016-04-13 DIAGNOSIS — R51 Headache: Principal | ICD-10-CM

## 2016-04-16 ENCOUNTER — Other Ambulatory Visit: Payer: BC Managed Care – PPO

## 2016-04-25 ENCOUNTER — Inpatient Hospital Stay
Admission: RE | Admit: 2016-04-25 | Discharge: 2016-04-25 | Disposition: A | Discharge status: Home / Self Care | Payer: BC Managed Care – PPO | Source: Ambulatory Visit | Attending: Obstetrics & Gynecology | Admitting: Obstetrics & Gynecology

## 2016-04-26 ENCOUNTER — Ambulatory Visit
Admission: RE | Admit: 2016-04-26 | Discharge: 2016-04-26 | Disposition: A | Discharge status: Home / Self Care | Payer: BC Managed Care – PPO | Source: Ambulatory Visit | Attending: Obstetrics & Gynecology | Admitting: Obstetrics & Gynecology

## 2016-04-26 ENCOUNTER — Encounter (INDEPENDENT_AMBULATORY_CARE_PROVIDER_SITE_OTHER): Payer: Self-pay | Admitting: Specialist

## 2016-04-26 ENCOUNTER — Ambulatory Visit (INDEPENDENT_AMBULATORY_CARE_PROVIDER_SITE_OTHER): Payer: BC Managed Care – PPO | Admitting: Specialist

## 2016-04-26 ENCOUNTER — Ambulatory Visit (INDEPENDENT_AMBULATORY_CARE_PROVIDER_SITE_OTHER): Payer: Self-pay

## 2016-04-26 VITALS — BP 126/86 | HR 87 | Ht 69.0 in | Wt 279.0 lb

## 2016-04-26 DIAGNOSIS — R519 Headache, unspecified: Secondary | ICD-10-CM

## 2016-04-26 DIAGNOSIS — Z981 Arthrodesis status: Secondary | ICD-10-CM | POA: Diagnosis not present

## 2016-04-26 DIAGNOSIS — R51 Headache: Principal | ICD-10-CM

## 2016-04-26 MED ORDER — GADOBENATE DIMEGLUMINE 529 MG/ML IV SOLN
20.0000 mL | Freq: Once | INTRAVENOUS | Status: AC | PRN
  Administered 2016-04-26: 20 mL via INTRAVENOUS

## 2016-04-26 NOTE — Patient Instructions (Signed)
Avoid frequent bending and stooping  No lifting greater than 10 lbs. May use ice or moist heat for pain. Weight loss is of benefit. Handicap license is approved.   

## 2016-04-26 NOTE — Progress Notes (Addendum)
Post-Op Visit Note   Patient: Kristen [name]           Date of Birth: 07/13/1965           MRN: [id number] Visit Date: 04/26/2016 PCP: Mauricio Po, FNP   Assessment & Plan:Now 3 months post L3-4 and L4-5 decompression and fusion for worsening spondylolisthesis and spinal stenosis below a Harrington rod. No pain complaints and is presently very pleased with the results of the 2 level fusion L3-4 and L4-5 below a long segment T-L fusion for scoliosis with right Harrington Rod. Reminded to be careful with bending and stooping and lifting, may discontinue her brace and be seen back in 3 months.   Chief Complaint:  Chief Complaint  Patient presents with  . Lower Back - Follow-up  Visit Diagnoses:  1. Status post lumbar spinal fusion   Lumbar exam, lower extremity motor is normal. SLR are normal Xrays suggest good stabiltiy post fusion.   Plan: Avoid frequent bending and stooping  No lifting greater than 10 lbs. May use ice or moist heat for pain. Weight loss is of benefit. Handicap license is approved.  Follow-Up Instructions: Return in about 3 months (around 07/26/2016).   Orders:  Orders Placed This Encounter  Procedures  . XR Lumbar Spine 2-3 Views   No orders of the defined types were placed in this encounter.   Imaging: Xr Lumbar Spine 2-3 Views  Result Date: 04/26/2016 AP and lateral flexion and extension radiographs of the lumbar spine show the presence of titanium cages at the L3-4 and L4-5 levels, right Harrington rod is divided above pedicle screws and rods L3 to L5. Lateral flexion and extension radiographs measured Similar points showing less than one mm of difference. Suggest early stability at both the L3-4 and L4-5 levels.    PMFS History: Patient Active Problem List   Diagnosis Date Noted  . Spinal stenosis of lumbar region 01/20/2016    Priority: High    Class: Chronic  . DDD (degenerative disc disease), lumbar 01/20/2016    Priority: High    Class:  Chronic  . Loose stools 03/12/2016  . Hypocalcemia 01/22/2016  . Thrombocytopenia ([place]) 01/22/2016  . Chronic diarrhea 01/22/2016  . Chest pain   . Essential hypertension   . Orthostatic hypotension   . Spinal stenosis of lumbar region with neurogenic claudication 01/20/2016  . Low back pain 12/22/2015  . Pre-op examination 12/22/2015  . OSA (obstructive sleep apnea) 10/13/2015  . Muscle cramp 08/01/2015  . GERD (gastroesophageal reflux disease) 07/09/2015  . Excessive daytime sleepiness 06/18/2015  . Shingles 08/28/2013  . Postop check 08/28/2013  . Cholelithiasis 07/10/2013  . Colitis 01/08/2013  . Postoperative anemia 01/08/2013  . Morbid obesity with BMI of 40.0-44.9, adult ([place]) 01/08/2013   Past Medical History:  Diagnosis Date  . Allergy   . Anemia    Iron deficiency  . Anxiety   . Arthritis    back, left shoulder  . Chicken pox   . Depression   . Diarrhea    takes Imodium daily  . GERD (gastroesophageal reflux disease)   . H/O hiatal hernia   . Headache(784.0)   . History of kidney stones    1996ish  . Hypertension   . Migraine    none for 5 years (as of 01/13/16)  . OSA (obstructive sleep apnea) 10/13/2015   unable to get cpap, plans to get one in 2018  . Pneumonia   . Restless legs   . Scoliosis   .  Shortness of breath    with exertion    Family History  Problem Relation Age of Onset  . Heart disease Father   . Heart attack Father   . Hypertension Mother   . Arthritis Mother   . Diabetes Maternal Grandmother   . Diabetes Paternal Grandmother   . Diabetes Maternal Uncle     Past Surgical History:  Procedure Laterality Date  . BACK SURGERY  07/21/1978  . CHOLECYSTECTOMY N/A 08/15/2013   Procedure: LAPAROSCOPIC CHOLECYSTECTOMY WITH INTRAOPERATIVE CHOLANGIOGRAM;  Surgeon: Gwenyth Ober, MD;  Location: [facility];  Service: General;  Laterality: N/A;  . COLONOSCOPY N/A 01/09/2013   Procedure: COLONOSCOPY;  Surgeon: Beryle Beams, MD;  Location: [facility];  Service: Endoscopy;  Laterality: N/A;  . HERNIA REPAIR Left 1981  . TONSILLECTOMY    . TUBAL LIGATION  06/1988   Social History   Occupational History  . Safety Assistant    Social History Main Topics  . Smoking status: Never Smoker  . Smokeless tobacco: Never Used  . Alcohol use 4.2 oz/week    7 Cans of beer per week     Comment: 1 beer daily  . Drug use: No  . Sexual activity: Yes    Birth control/ protection: Surgical

## 2016-05-03 ENCOUNTER — Telehealth (INDEPENDENT_AMBULATORY_CARE_PROVIDER_SITE_OTHER): Payer: Self-pay | Admitting: *Deleted

## 2016-05-03 NOTE — Telephone Encounter (Signed)
Patient called in this afternoon in regards to needing to let us know that [facility] is going to be taking care of her prescription refills from now on, and they will be in contact with [place] soon, Thank you  Her CB # [phone].

## 2016-05-04 ENCOUNTER — Encounter: Payer: Self-pay | Admitting: Family

## 2016-05-04 ENCOUNTER — Other Ambulatory Visit: Payer: Self-pay

## 2016-05-04 DIAGNOSIS — R195 Other fecal abnormalities: Secondary | ICD-10-CM

## 2016-05-04 DIAGNOSIS — K2100 Gastro-esophageal reflux disease with esophagitis, without bleeding: Secondary | ICD-10-CM

## 2016-05-04 DIAGNOSIS — I10 Essential (primary) hypertension: Secondary | ICD-10-CM

## 2016-05-04 MED ORDER — ELUXADOLINE 75 MG PO TABS: 75.0000 mg | ORAL_TABLET | Freq: Two times a day (BID) | ORAL | 1 refills | Status: DC

## 2016-05-04 MED ORDER — GABAPENTIN 300 MG PO CAPS: 300.0000 mg | ORAL_CAPSULE | Freq: Three times a day (TID) | ORAL | 0 refills | Status: DC

## 2016-05-04 MED ORDER — LISINOPRIL 40 MG PO TABS: 40.0000 mg | ORAL_TABLET | Freq: Every day | ORAL | 1 refills | Status: DC

## 2016-05-04 MED ORDER — PANTOPRAZOLE SODIUM 40 MG PO TBEC: 40.0000 mg | DELAYED_RELEASE_TABLET | Freq: Every day | ORAL | 1 refills | Status: DC

## 2016-05-04 NOTE — Telephone Encounter (Signed)
Faxed the Viberzi manually to [facility]...Johny Chess

## 2016-05-05 ENCOUNTER — Encounter: Payer: Self-pay | Admitting: Family

## 2016-05-05 ENCOUNTER — Telehealth (INDEPENDENT_AMBULATORY_CARE_PROVIDER_SITE_OTHER): Payer: Self-pay | Admitting: Orthopaedic Surgery

## 2016-05-05 NOTE — Telephone Encounter (Signed)
Kristen [name] with Kristen [name] called asked if a prescription request was received for Gabapentin 390m. The number to contact Kristen Lanceis [phone]

## 2016-05-06 NOTE — Telephone Encounter (Signed)
Faxed back today was waiting for Dr Phoebe Sharps approval

## 2016-05-11 ENCOUNTER — Other Ambulatory Visit (INDEPENDENT_AMBULATORY_CARE_PROVIDER_SITE_OTHER): Payer: Self-pay | Admitting: Specialist

## 2016-05-11 NOTE — Telephone Encounter (Signed)
Tizanidine refill request

## 2016-05-14 ENCOUNTER — Ambulatory Visit (INDEPENDENT_AMBULATORY_CARE_PROVIDER_SITE_OTHER): Payer: BC Managed Care – PPO | Admitting: Family

## 2016-05-14 ENCOUNTER — Encounter: Payer: Self-pay | Admitting: Family

## 2016-05-14 VITALS — BP 126/88 | HR 78 | Temp 98.3°F | Resp 16 | Ht 69.0 in | Wt 274.0 lb

## 2016-05-14 DIAGNOSIS — R519 Headache, unspecified: Secondary | ICD-10-CM | POA: Insufficient documentation

## 2016-05-14 DIAGNOSIS — K148 Other diseases of tongue: Secondary | ICD-10-CM

## 2016-05-14 DIAGNOSIS — G4482 Headache associated with sexual activity: Secondary | ICD-10-CM

## 2016-05-14 MED ORDER — SUMATRIPTAN SUCCINATE 100 MG PO TABS: ORAL_TABLET | ORAL | 0 refills | Status: DC

## 2016-05-14 MED ORDER — INDOMETHACIN 25 MG PO CAPS: ORAL_CAPSULE | ORAL | 0 refills | Status: DC

## 2016-05-14 NOTE — Progress Notes (Signed)
Subjective:    Patient ID: Kristen [name], female    DOB: Jun 16, 1965, 51 y.o.   MRN: [id number]  Chief Complaint  Patient presents with  . Follow-up    MRI    HPI:  Kristen [name] is a 51 y.o. female who  has a past medical history of Allergy; Anemia; Anxiety; Arthritis; Chicken pox; Depression; Diarrhea; GERD (gastroesophageal reflux disease); H/O hiatal hernia; Headache(784.0); History of kidney stones; Hypertension; Migraine; OSA (obstructive sleep apnea) (10/13/2015); Pneumonia; Restless legs; Scoliosis; and Shortness of breath. and presents today for a follow up office visit.  This is a new problem. Recently evaluated by gynecology for new onset headaches following sex. MRI completed showing no specific or reversible explanation for headache; nonspecific FLAIR hyperintensities and cerebral white matter more than expected for age with potential sequela consistent with hypertension or migraine. Also a 35 mm submucosal mass in the left oral tongue was noted on T2 image with hyperintense and avidly enhancing appearance. Radiology recommended correlating for signs of hemangioma prior to any biopsy.  Also endorses increased frequency of headaches recently. Has been on Imitrex in the past which has helped. She also endorses pain located in her mouth with some difficulties with eating at times but the symptoms generally wax and wane. Denies any modifying factors or attempted treatments.    Allergies  Allergen Reactions  . Other Hives and Swelling    tomato  . Sulfur Hives and Swelling      Outpatient Medications Prior to Visit  Medication Sig Dispense Refill  . diclofenac (VOLTAREN) 75 MG EC tablet Take 75 mg by mouth 2 (two) times daily as needed (for pain.).     Marland Kitchen Eluxadoline (VIBERZI) 75 MG TABS Take 75 mg by mouth 2 (two) times daily. 180 tablet 1  . gabapentin (NEURONTIN) 300 MG capsule Take 1 capsule (300 mg total) by mouth 3 (three) times daily. 270 capsule 0  . lisinopril  (PRINIVIL,ZESTRIL) 40 MG tablet Take 1 tablet (40 mg total) by mouth daily. 90 tablet 1  . loperamide (IMODIUM) 2 MG capsule Take 2-4 mg by mouth 2 (two) times daily as needed for diarrhea or loose stools.     . pantoprazole (PROTONIX) 40 MG tablet Take 1 tablet (40 mg total) by mouth daily. 90 tablet 1  . predniSONE (DELTASONE) 10 MG tablet Take 4 tablets po 01/27/2016, Take 3 and 1/2 tablets po 01/28/2016, Take 3 tablets po 1/11/2018m Take 2 and 1/2 tablets po 01/30/2016, Take 2 tablets po 01/31/2016, Take 1 and 1/2 tablets po 02/01/2016, Take 1 tablet po 02/02/2016, Then take 1/2 tablet po on 02/03/2016. 18 tablet 0  . predniSONE (DELTASONE) 5 MG tablet Take 8 tablets (40 mg total) by mouth daily with breakfast. 36 tablet 0  . tiZANidine (ZANAFLEX) 4 MG tablet TAKE 1 TABLET BY MOUTH EVERY 6 HOURS AS NEEDED 60 tablet 0  . traMADol (ULTRAM) 50 MG tablet Take 2 tablets (100 mg total) by mouth every 6 (six) hours as needed. 60 tablet 0   No facility-administered medications prior to visit.       Past Surgical History:  Procedure Laterality Date  . BACK SURGERY  07/21/1978  . CHOLECYSTECTOMY N/A 08/15/2013   Procedure: LAPAROSCOPIC CHOLECYSTECTOMY WITH INTRAOPERATIVE CHOLANGIOGRAM;  Surgeon: Gwenyth Ober, MD;  Location: [facility];  Service: General;  Laterality: N/A;  . COLONOSCOPY N/A 01/09/2013   Procedure: COLONOSCOPY;  Surgeon: Beryle Beams, MD;  Location: [facility];  Service: Endoscopy;  Laterality: N/A;  .  HERNIA REPAIR Left 1981  . TONSILLECTOMY    . TUBAL LIGATION  06/1988      Past Medical History:  Diagnosis Date  . Allergy   . Anemia    Iron deficiency  . Anxiety   . Arthritis    back, left shoulder  . Chicken pox   . Depression   . Diarrhea    takes Imodium daily  . GERD (gastroesophageal reflux disease)   . H/O hiatal hernia   . Headache(784.0)   . History of kidney stones    1996ish  . Hypertension   . Migraine    none for 5 years (as of 01/13/16)  . OSA (obstructive  sleep apnea) 10/13/2015   unable to get cpap, plans to get one in 2018  . Pneumonia   . Restless legs   . Scoliosis   . Shortness of breath    with exertion      Review of Systems  Constitutional: Negative for fatigue and fever.  HENT: Negative for sore throat, trouble swallowing and voice change.   Respiratory: Negative for chest tightness and shortness of breath.   Cardiovascular: Negative for chest pain, palpitations and leg swelling.  Neurological: Positive for headaches. Negative for tremors, seizures, syncope and weakness.      Objective:    BP 126/88 (BP Location: Left Arm, Patient Position: Sitting, Cuff Size: Large)   Pulse 78   Temp 98.3 F (36.8 C) (Oral)   Resp 16   Ht 5' 9"  (1.753 m)   Wt 274 lb (124.3 kg)   SpO2 98%   BMI 40.46 kg/m  Nursing note and vital signs reviewed.  Physical Exam  Constitutional: She is oriented to person, place, and time. She appears well-developed and well-nourished. No distress.  HENT:  Right Ear: Hearing, tympanic membrane, external ear and ear canal normal.  Left Ear: Hearing, tympanic membrane, external ear and ear canal normal.  Nose: Nose normal.  Mouth/Throat: Uvula is midline, oropharynx is clear and moist and mucous membranes are normal. Mucous membranes are not pale, not dry and not cyanotic. No oral lesions. No oropharyngeal exudate, posterior oropharyngeal edema, posterior oropharyngeal erythema or tonsillar abscesses.  Eyes: Conjunctivae and EOM are normal. Pupils are equal, round, and reactive to light.  Cardiovascular: Normal rate, regular rhythm, normal heart sounds and intact distal pulses.   Pulmonary/Chest: Effort normal and breath sounds normal.  Neurological: She is alert and oriented to person, place, and time. No cranial nerve deficit.  Skin: Skin is warm and dry.  Psychiatric: She has a normal mood and affect. Her behavior is normal. Judgment and thought content normal.       Assessment & Plan:   Problem  List Items Addressed This Visit      Other   Headache associated with sexual activity    Symptoms remain consistent with headaches following sexual activity that are consistent with migraines. MRI with no significant causes of headaches in the setting of previous migraine headaches. Start Indomethacin for headache 30 minutes prior to sexual activity. Will also start Imitrex for headaches. Follow up if headache symptoms worsen or increase in intensity.      Relevant Medications   indomethacin (INDOCIN) 25 MG capsule   SUMAtriptan (IMITREX) 100 MG tablet   Tongue lesion    No significant findings noted on physical exam. Referral to ENT placed given MRI findings. Follow up pending referral.       Relevant Orders   Ambulatory referral to ENT  I am having Ms. Kienast start on indomethacin and SUMAtriptan. I am also having her maintain her diclofenac, loperamide, predniSONE, predniSONE, traMADol, lisinopril, pantoprazole, Eluxadoline, gabapentin, and tiZANidine.   Meds ordered this encounter  Medications  . indomethacin (INDOCIN) 25 MG capsule    Sig: Take 1 capsule by mouth 30-60 minutes prior to sexual activity.    Dispense:  30 capsule    Refill:  0    Order Specific Question:   Supervising Provider    Answer:   Pricilla Holm A [[id number]]  . SUMAtriptan (IMITREX) 100 MG tablet    Sig: Take 0.5-1 tablet by mouth at the onset of migraine and may repeat in 2 hours if headache persists or recurs.    Dispense:  10 tablet    Refill:  0    Order Specific Question:   Supervising Provider    Answer:   Pricilla Holm A [[id number]]     Follow-up: Return if symptoms worsen or fail to improve.  Mauricio Po, FNP

## 2016-05-14 NOTE — Assessment & Plan Note (Addendum)
Symptoms remain consistent with headaches following sexual activity that are consistent with migraines. MRI with no significant causes of headaches in the setting of previous migraine headaches. Start Indomethacin for headache 30 minutes prior to sexual activity. Will also start Imitrex for headaches. Follow up if headache symptoms worsen or increase in intensity.

## 2016-05-14 NOTE — Assessment & Plan Note (Signed)
No significant findings noted on physical exam. Referral to ENT placed given MRI findings. Follow up pending referral.

## 2016-05-14 NOTE — Patient Instructions (Addendum)
Thank you for choosing [facility].  SUMMARY AND INSTRUCTIONS:  Start the indomethacin 30-60 minutes prior to sex.  Start the Immitrex as needed for migraines.  If the frequency increases we can add a daily medication.  They will call with your referral to ENT.   Medication:  Your prescription(s) have been submitted to your pharmacy or been printed and provided for you. Please take as directed and contact our office if you believe you are having problem(s) with the medication(s) or have any questions.  Follow up:  If your symptoms worsen or fail to improve, please contact our office for further instruction, or in case of emergency go directly to the emergency room at the closest medical facility.    Migraine Headache A migraine headache is a very strong throbbing pain on one side or both sides of your head. Migraines can also cause other symptoms. Talk with your doctor about what things may bring on (trigger) your migraine headaches. Follow these instructions at home: Medicines   Take over-the-counter and prescription medicines only as told by your doctor.  Do not drive or use heavy machinery while taking prescription pain medicine.  To prevent or treat constipation while you are taking prescription pain medicine, your doctor may recommend that you:  Drink enough fluid to keep your pee (urine) clear or pale yellow.  Take over-the-counter or prescription medicines.  Eat foods that are high in fiber. These include fresh fruits and vegetables, whole grains, and beans.  Limit foods that are high in fat and processed sugars. These include fried and sweet foods. Lifestyle   Avoid alcohol.  Do not use any products that contain nicotine or tobacco, such as cigarettes and e-cigarettes. If you need help quitting, ask your doctor.  Get at least 8 hours of sleep every night.  Limit your stress. General instructions    Keep a journal to find out what may bring on your  migraines. For example, write down:  What you eat and drink.  How much sleep you get.  Any change in what you eat or drink.  Any change in your medicines.  If you have a migraine:  Avoid things that make your symptoms worse, such as bright lights.  It may help to lie down in a dark, quiet room.  Do not drive or use heavy machinery.  Ask your doctor what activities are safe for you.  Keep all follow-up visits as told by your doctor. This is important. Contact a doctor if:  You get a migraine that is different or worse than your usual migraines. Get help right away if:  Your migraine gets very bad.  You have a fever.  You have a stiff neck.  You have trouble seeing.  Your muscles feel weak or like you cannot control them.  You start to lose your balance a lot.  You start to have trouble walking.  You pass out (faint). This information is not intended to replace advice given to you by your health care provider. Make sure you discuss any questions you have with your health care provider. Document Released: 10/14/2007 Document Revised: 07/25/2015 Document Reviewed: 06/23/2015 Elsevier Interactive Patient Education  2017 [employer].

## 2016-05-17 ENCOUNTER — Encounter: Payer: Self-pay | Admitting: Family

## 2016-05-17 ENCOUNTER — Other Ambulatory Visit (INDEPENDENT_AMBULATORY_CARE_PROVIDER_SITE_OTHER): Payer: Self-pay | Admitting: Orthopaedic Surgery

## 2016-05-17 NOTE — Telephone Encounter (Signed)
Called [facility] to verify msg. Spoke w/tech she stated they was able to view signature they had to order the medication since they were out, pt can pick medication up today. Sent pt mychart msg w/ status...Kristen [name]

## 2016-05-21 ENCOUNTER — Encounter: Payer: Self-pay | Admitting: Family

## 2016-05-21 MED ORDER — ELUXADOLINE 100 MG PO TABS: 100.0000 mg | ORAL_TABLET | Freq: Two times a day (BID) | ORAL | 1 refills | Status: DC

## 2016-06-04 ENCOUNTER — Encounter (INDEPENDENT_AMBULATORY_CARE_PROVIDER_SITE_OTHER): Payer: Self-pay | Admitting: Specialist

## 2016-06-07 ENCOUNTER — Encounter: Payer: Self-pay | Admitting: Family

## 2016-06-07 ENCOUNTER — Other Ambulatory Visit (INDEPENDENT_AMBULATORY_CARE_PROVIDER_SITE_OTHER): Payer: Self-pay | Admitting: Radiology

## 2016-06-07 MED ORDER — TRAMADOL HCL 50 MG PO TABS: 100.0000 mg | ORAL_TABLET | Freq: Four times a day (QID) | ORAL | 0 refills | Status: DC | PRN

## 2016-06-07 NOTE — Telephone Encounter (Signed)
I messaged pt to let her know I called her rx to her pharmacy.

## 2016-06-15 ENCOUNTER — Other Ambulatory Visit: Payer: Self-pay | Admitting: Family

## 2016-06-25 ENCOUNTER — Other Ambulatory Visit: Payer: Self-pay | Admitting: Otolaryngology

## 2016-06-25 DIAGNOSIS — K148 Other diseases of tongue: Secondary | ICD-10-CM

## 2016-06-25 DIAGNOSIS — K219 Gastro-esophageal reflux disease without esophagitis: Secondary | ICD-10-CM

## 2016-06-25 DIAGNOSIS — R220 Localized swelling, mass and lump, head: Principal | ICD-10-CM

## 2016-06-30 ENCOUNTER — Ambulatory Visit
Admission: RE | Admit: 2016-06-30 | Discharge: 2016-06-30 | Disposition: A | Discharge status: Home / Self Care | Payer: BC Managed Care – PPO | Source: Ambulatory Visit | Attending: Otolaryngology | Admitting: Otolaryngology

## 2016-06-30 DIAGNOSIS — K148 Other diseases of tongue: Secondary | ICD-10-CM

## 2016-06-30 DIAGNOSIS — K219 Gastro-esophageal reflux disease without esophagitis: Secondary | ICD-10-CM

## 2016-06-30 DIAGNOSIS — R220 Localized swelling, mass and lump, head: Principal | ICD-10-CM

## 2016-06-30 MED ORDER — IOPAMIDOL (ISOVUE-300) INJECTION 61%
75.0000 mL | Freq: Once | INTRAVENOUS | Status: AC | PRN
  Administered 2016-06-30: 75 mL via INTRAVENOUS

## 2016-07-06 ENCOUNTER — Other Ambulatory Visit (INDEPENDENT_AMBULATORY_CARE_PROVIDER_SITE_OTHER): Payer: Self-pay | Admitting: Orthopaedic Surgery

## 2016-07-06 NOTE — Telephone Encounter (Signed)
Ok to rf? 

## 2016-07-14 ENCOUNTER — Encounter: Payer: Self-pay | Admitting: Family

## 2016-07-14 MED ORDER — TRAMADOL HCL 50 MG PO TABS: 100.0000 mg | ORAL_TABLET | Freq: Four times a day (QID) | ORAL | 0 refills | Status: DC | PRN

## 2016-07-16 ENCOUNTER — Other Ambulatory Visit: Payer: Self-pay | Admitting: Otolaryngology

## 2016-07-18 HISTORY — PX: DIRECT LARYNGOSCOPY: SHX5326

## 2016-07-25 ENCOUNTER — Encounter (INDEPENDENT_AMBULATORY_CARE_PROVIDER_SITE_OTHER): Payer: Self-pay | Admitting: Specialist

## 2016-07-25 ENCOUNTER — Encounter: Payer: Self-pay | Admitting: Family

## 2016-07-26 ENCOUNTER — Ambulatory Visit (INDEPENDENT_AMBULATORY_CARE_PROVIDER_SITE_OTHER): Payer: BC Managed Care – PPO | Admitting: Specialist

## 2016-08-01 ENCOUNTER — Other Ambulatory Visit (INDEPENDENT_AMBULATORY_CARE_PROVIDER_SITE_OTHER): Payer: Self-pay | Admitting: Specialist

## 2016-08-01 ENCOUNTER — Other Ambulatory Visit (INDEPENDENT_AMBULATORY_CARE_PROVIDER_SITE_OTHER): Payer: Self-pay | Admitting: Orthopaedic Surgery

## 2016-08-02 NOTE — Telephone Encounter (Signed)
Can you please advise? JN is out today and will not be back till Wednesday

## 2016-08-04 ENCOUNTER — Encounter (INDEPENDENT_AMBULATORY_CARE_PROVIDER_SITE_OTHER): Payer: Self-pay | Admitting: Specialist

## 2016-08-05 ENCOUNTER — Encounter (INDEPENDENT_AMBULATORY_CARE_PROVIDER_SITE_OTHER): Payer: Self-pay | Admitting: Specialist

## 2016-08-05 ENCOUNTER — Ambulatory Visit (INDEPENDENT_AMBULATORY_CARE_PROVIDER_SITE_OTHER): Payer: Self-pay

## 2016-08-05 ENCOUNTER — Ambulatory Visit (INDEPENDENT_AMBULATORY_CARE_PROVIDER_SITE_OTHER): Payer: BC Managed Care – PPO | Admitting: Specialist

## 2016-08-05 VITALS — BP 122/88 | Ht 69.0 in | Wt 279.0 lb

## 2016-08-05 DIAGNOSIS — Z981 Arthrodesis status: Secondary | ICD-10-CM | POA: Diagnosis not present

## 2016-08-05 MED ORDER — TRAMADOL HCL 50 MG PO TABS: 50.0000 mg | ORAL_TABLET | Freq: Two times a day (BID) | ORAL | 0 refills | Status: DC | PRN

## 2016-08-05 NOTE — Progress Notes (Signed)
Office Visit Note   Patient: Kristen [name]           Date of Birth: 18-Sep-1965           MRN: [id number] Visit Date: 08/05/2016              Requested by: [address], [place], [place] [address] PCP: [facility], FNP   Assessment & Plan: Visit Diagnoses:  1. Status post lumbar spinal fusion     Plan: Patient doing well at this point. Continues to improve. 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  Understands that she must be very careful with bending twisting lifting. Refill tramadol.  Follow-Up Instructions: Return in about 2 months (around 10/06/2016).   Orders:  No orders of the defined types were placed in this encounter.  Meds ordered this encounter  Medications  . traMADol (ULTRAM) 50 MG tablet    Sig: Take 1 tablet (50 mg total) by mouth every 12 (twelve) hours as needed.    Dispense:  60 tablet    Refill:  0      Procedures: No procedures performed   Clinical Data: No additional findings.   Subjective: Chief Complaint  Patient presents with  . Lower Back - Follow-up    HPI Patient returns. 6 months status post removal of rod and right L2-3. Transforaminal lumbar interbody fusion right L3-4 right L4-5 with pedicle screws and rods L2, L3, L4 and L5. States that she is doing well. 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  some occasional aches here and there but otherwise she is very pleased with her surgical result. Needs refill of tramadol Review of Systems No current cardiac pulmonary GI GU issues.  Objective: Vital Signs: BP 122/88 (BP Location: Left Arm, Patient Position: Sitting)   Ht 5' 9"  (1.753 m)   Wt 279 lb (126.6 kg)   BMI 41.20 kg/m   Physical Exam  Constitutional: She is oriented to person, place, and time. No distress.  HENT:  Head: Normocephalic and atraumatic.  Eyes: Pupils are equal, round, and reactive to light.  EOM are normal.  Pulmonary/Chest: No respiratory distress.  Abdominal: She exhibits no distension.  Musculoskeletal:  Gait is normal. Negative logroll bilateral hips. Negative straight leg raise. Neurovascularly intact.  No focal motor deficits  Neurological: She is alert and oriented to person, place, and time.  Skin: Skin is warm and dry.  Psychiatric: She has a normal mood and affect.    Ortho Exam  Specialty Comments:  No specialty comments available.  Imaging: No results found.   PMFS History: Patient Active Problem List   Diagnosis Date Noted  . Headache associated with sexual activity 05/14/2016  . Tongue lesion 05/14/2016  . Loose stools 03/12/2016  . Hypocalcemia 01/22/2016  . Thrombocytopenia ([place]) 01/22/2016  . Chronic diarrhea 01/22/2016  . Chest pain   . Essential hypertension   . Orthostatic hypotension   . Spinal stenosis of lumbar region 01/20/2016    Class: Chronic  . DDD (degenerative disc disease), lumbar 01/20/2016    Class: Chronic  . Spinal stenosis of lumbar region with neurogenic claudication 01/20/2016  . Low back pain 12/22/2015  . Pre-op examination 12/22/2015  . OSA (obstructive sleep apnea) 10/13/2015  . Muscle cramp 08/01/2015  . GERD (gastroesophageal reflux disease) 07/09/2015  . Excessive daytime sleepiness 06/18/2015  . Shingles  08/28/2013  . Postop check 08/28/2013  . Cholelithiasis 07/10/2013  . Colitis 01/08/2013  . Postoperative anemia 01/08/2013  . Morbid obesity with BMI of 40.0-44.9, adult ([place]) 01/08/2013   Past Medical History:  Diagnosis Date  . Allergy   . Anemia    Iron deficiency  . Anxiety   . Arthritis    back, left shoulder  . Chicken pox   . Depression   . Diarrhea    takes Imodium daily  . GERD (gastroesophageal reflux disease)   . H/O hiatal hernia   . Headache(784.0)   . History of kidney stones    1996ish  . Hypertension   . Migraine    none for 5 years (as of 01/13/16)  . OSA (obstructive  sleep apnea) 10/13/2015   unable to get cpap, plans to get one in 2018  . Pneumonia   . Restless legs   . Scoliosis   . Shortness of breath    with exertion    Family History  Problem Relation Age of Onset  . Heart disease Father   . Heart attack Father   . Hypertension Mother   . Arthritis Mother   . Diabetes Maternal Grandmother   . Diabetes Paternal Grandmother   . Diabetes Maternal Uncle     Past Surgical History:  Procedure Laterality Date  . BACK SURGERY  07/21/1978  . CHOLECYSTECTOMY N/A 08/15/2013   Procedure: LAPAROSCOPIC CHOLECYSTECTOMY WITH INTRAOPERATIVE CHOLANGIOGRAM;  Surgeon: Gwenyth Ober, MD;  Location: [facility];  Service: General;  Laterality: N/A;  . COLONOSCOPY N/A 01/09/2013   Procedure: COLONOSCOPY;  Surgeon: Beryle Beams, MD;  Location: [facility];  Service: Endoscopy;  Laterality: N/A;  . HERNIA REPAIR Left 1981  . TONSILLECTOMY    . TUBAL LIGATION  06/1988   Social History   Occupational History  . Safety Assistant    Social History Main Topics  . Smoking status: Never Smoker  . Smokeless tobacco: Never Used  . Alcohol use 4.2 oz/week    7 Cans of beer per week     Comment: 1 beer daily  . Drug use: No  . Sexual activity: Yes    Birth control/ protection: Surgical

## 2016-08-12 ENCOUNTER — Encounter: Payer: Self-pay | Admitting: Family

## 2016-08-27 ENCOUNTER — Other Ambulatory Visit (INDEPENDENT_AMBULATORY_CARE_PROVIDER_SITE_OTHER): Payer: Self-pay | Admitting: Surgery

## 2016-08-27 NOTE — Telephone Encounter (Signed)
Tramadol refill request

## 2016-08-30 NOTE — Telephone Encounter (Signed)
Called rx to [facility]

## 2016-09-09 ENCOUNTER — Encounter: Payer: Self-pay | Admitting: Family

## 2016-09-13 ENCOUNTER — Encounter: Payer: Self-pay | Admitting: Family

## 2016-09-14 ENCOUNTER — Ambulatory Visit: Payer: BC Managed Care – PPO

## 2016-09-22 ENCOUNTER — Other Ambulatory Visit (INDEPENDENT_AMBULATORY_CARE_PROVIDER_SITE_OTHER): Payer: Self-pay | Admitting: Radiology

## 2016-09-22 MED ORDER — TRAMADOL HCL 50 MG PO TABS: 50.0000 mg | ORAL_TABLET | Freq: Two times a day (BID) | ORAL | 0 refills | Status: DC | PRN

## 2016-09-23 NOTE — Telephone Encounter (Signed)
Pt is aware.  

## 2016-09-23 NOTE — Telephone Encounter (Signed)
I called rx to [facility]

## 2016-09-27 HISTORY — PX: OTHER SURGICAL HISTORY: SHX169

## 2016-09-27 HISTORY — PX: MODIFIED RADICAL NECK DISSECTION: SHX2045

## 2016-09-27 HISTORY — PX: PARTIAL GLOSSECTOMY: SHX2173

## 2016-09-27 HISTORY — PX: GASTROSTOMY TUBE PLACEMENT: SHX655

## 2016-10-06 ENCOUNTER — Encounter (HOSPITAL_COMMUNITY): Payer: Self-pay | Admitting: Emergency Medicine

## 2016-10-06 ENCOUNTER — Ambulatory Visit (INDEPENDENT_AMBULATORY_CARE_PROVIDER_SITE_OTHER): Payer: BC Managed Care – PPO | Admitting: Specialist

## 2016-10-06 DIAGNOSIS — E860 Dehydration: Secondary | ICD-10-CM | POA: Diagnosis not present

## 2016-10-06 DIAGNOSIS — Z79899 Other long term (current) drug therapy: Secondary | ICD-10-CM | POA: Diagnosis not present

## 2016-10-06 DIAGNOSIS — G43A0 Cyclical vomiting, not intractable: Secondary | ICD-10-CM | POA: Insufficient documentation

## 2016-10-06 DIAGNOSIS — R112 Nausea with vomiting, unspecified: Secondary | ICD-10-CM | POA: Diagnosis present

## 2016-10-06 DIAGNOSIS — I10 Essential (primary) hypertension: Secondary | ICD-10-CM | POA: Diagnosis not present

## 2016-10-06 LAB — COMPREHENSIVE METABOLIC PANEL
ALT: 49 U/L (ref 14–54)
ANION GAP: 9 (ref 5–15)
AST: 24 U/L (ref 15–41)
Albumin: 3.6 g/dL (ref 3.5–5.0)
Alkaline Phosphatase: 135 U/L — ABNORMAL HIGH (ref 38–126)
BUN: 29 mg/dL — AB (ref 6–20)
CHLORIDE: 102 mmol/L (ref 101–111)
CO2: 28 mmol/L (ref 22–32)
Calcium: 9.1 mg/dL (ref 8.9–10.3)
Creatinine, Ser: 0.97 mg/dL (ref 0.44–1.00)
Glucose, Bld: 104 mg/dL — ABNORMAL HIGH (ref 65–99)
POTASSIUM: 4.5 mmol/L (ref 3.5–5.1)
Sodium: 139 mmol/L (ref 135–145)
Total Bilirubin: 0.8 mg/dL (ref 0.3–1.2)
Total Protein: 7.6 g/dL (ref 6.5–8.1)

## 2016-10-06 LAB — CBC
HCT: 34.7 % — ABNORMAL LOW (ref 36.0–46.0)
HEMOGLOBIN: 10.9 g/dL — AB (ref 12.0–15.0)
MCH: 27.7 pg (ref 26.0–34.0)
MCHC: 31.4 g/dL (ref 30.0–36.0)
MCV: 88.3 fL (ref 78.0–100.0)
Platelets: 384 10*3/uL (ref 150–400)
RBC: 3.93 MIL/uL (ref 3.87–5.11)
RDW: 14.9 % (ref 11.5–15.5)
WBC: 10.4 10*3/uL (ref 4.0–10.5)

## 2016-10-06 LAB — LIPASE, BLOOD: LIPASE: 21 U/L (ref 11–51)

## 2016-10-06 NOTE — ED Triage Notes (Signed)
Patient reports emesis x2 today with mild tracheostomy pain , respirations unlabored , denies fever or chills .

## 2016-10-07 ENCOUNTER — Telehealth: Payer: Self-pay | Admitting: Family

## 2016-10-07 ENCOUNTER — Emergency Department (HOSPITAL_COMMUNITY)
Admission: EM | Admit: 2016-10-07 | Discharge: 2016-10-07 | Disposition: A | Discharge status: Home / Self Care | Payer: BC Managed Care – PPO | Attending: Emergency Medicine | Admitting: Emergency Medicine

## 2016-10-07 ENCOUNTER — Emergency Department (HOSPITAL_COMMUNITY): Payer: BC Managed Care – PPO

## 2016-10-07 DIAGNOSIS — E860 Dehydration: Secondary | ICD-10-CM

## 2016-10-07 DIAGNOSIS — R1115 Cyclical vomiting syndrome unrelated to migraine: Secondary | ICD-10-CM

## 2016-10-07 LAB — URINALYSIS, ROUTINE W REFLEX MICROSCOPIC
BILIRUBIN URINE: NEGATIVE
GLUCOSE, UA: NEGATIVE mg/dL
Hgb urine dipstick: NEGATIVE
KETONES UR: NEGATIVE mg/dL
Nitrite: NEGATIVE
PROTEIN: NEGATIVE mg/dL
Specific Gravity, Urine: 1.013 (ref 1.005–1.030)
pH: 8 (ref 5.0–8.0)

## 2016-10-07 LAB — TROPONIN I: Troponin I: <0.03 ng/mL (ref <0.03)

## 2016-10-07 MED ORDER — SODIUM CHLORIDE 0.9 % IV BOLUS (SEPSIS)
1000.0000 mL | Freq: Once | INTRAVENOUS | Status: AC
  Administered 2016-10-07: 1000 mL via INTRAVENOUS

## 2016-10-07 MED ORDER — ONDANSETRON HCL 4 MG/2ML IJ SOLN
4.0000 mg | Freq: Once | INTRAMUSCULAR | Status: AC
  Administered 2016-10-07: 4 mg via INTRAVENOUS
  Filled 2016-10-07: qty 2

## 2016-10-07 NOTE — Progress Notes (Signed)
Called to evaluate patients trach.  Pt has her PMV speaking valve in place and currently on room air.  She states she just had trach put in Monday at [facility] and discharge shortly thereafter.  Complaining of throwing up and sick on stomach.  I cleaned around trach changed gauze and suctioned patients trach without any events to report.  Placed patient on 21% humidification ATC.  Pt SPO2 is 100% and she don't  require oxygen at home.  Pt states she is shaking and nervous RN aware and at beside RT will continue to treat and monitor Trach.

## 2016-10-07 NOTE — ED Notes (Signed)
Patient transported to X-ray 

## 2016-10-07 NOTE — Telephone Encounter (Signed)
Patient went to the ER yesterday because of vomiting, oral thrush,  Would like to know if she can an oral medication that melts on her tongue.  She was told she would have be given a protein supplement with her tube feeding but she did not receive one at the hospital, she would like a Rx for this.  Please advise and call back

## 2016-10-07 NOTE — ED Notes (Signed)
PAGED RT

## 2016-10-07 NOTE — Discharge Instructions (Signed)
°  SEEK IMMEDIATE MEDICAL ATTENTION IF: A temperature above 100.54F develops.  Repeated vomiting occurs (multiple episodes).  The pain becomes localized to portions of the abdomen. The right side could possibly be appendicitis. In an adult, the left lower portion of the abdomen could be colitis or diverticulitis.  Blood is being passed in stools or vomit (bright red or black tarry stools).  Return also if you develop chest pain, difficulty breathing, dizziness or fainting, or become confused, poorly responsive, or inconsolable.

## 2016-10-07 NOTE — ED Provider Notes (Signed)
[facility] DEPT Provider Note   CSN: [id number] Arrival date & time: 10/06/16  2114     History   Chief Complaint Chief Complaint  Patient presents with  . Emesis  . Tracheostomy Pain    HPI Kristen [name] is a 51 y.o. female.  The history is provided by the patient and a parent.  Emesis   This is a new problem. The current episode started 12 to 24 hours ago. The problem has been gradually improving. There has been no fever. Associated symptoms include cough. Pertinent negatives include no abdominal pain, no diarrhea and no fever.  pt with h/o tongue CA with recent glossectomy with trach/PEG in place She reports over past 24 hrs she has had increased nausea/vomiting She has had some constipation (last BM was 3 days ago) She also reports using diclofenac which may have upset her stomach No cp/sob She has had coughing attacks due to her trach, but no trach pain or blood discharge at this time No ABD pain No bloody stools   Past Medical History:  Diagnosis Date  . Allergy   . Anemia    Iron deficiency  . Anxiety   . Arthritis    back, left shoulder  . Chicken pox   . Depression   . Diarrhea    takes Imodium daily  . GERD (gastroesophageal reflux disease)   . H/O hiatal hernia   . Headache(784.0)   . History of kidney stones    1996ish  . Hypertension   . Migraine    none for 5 years (as of 01/13/16)  . OSA (obstructive sleep apnea) 10/13/2015   unable to get cpap, plans to get one in 2018  . Pneumonia   . Restless legs   . Scoliosis   . Shortness of breath    with exertion    Patient Active Problem List   Diagnosis Date Noted  . Headache associated with sexual activity 05/14/2016  . Tongue lesion 05/14/2016  . Loose stools 03/12/2016  . Hypocalcemia 01/22/2016  . Thrombocytopenia ([place]) 01/22/2016  . Chronic diarrhea 01/22/2016  . Chest pain   . Essential hypertension   . Orthostatic hypotension   . Spinal stenosis of lumbar region 01/20/2016      Class: Chronic  . DDD (degenerative disc disease), lumbar 01/20/2016    Class: Chronic  . Spinal stenosis of lumbar region with neurogenic claudication 01/20/2016  . Low back pain 12/22/2015  . Pre-op examination 12/22/2015  . OSA (obstructive sleep apnea) 10/13/2015  . Muscle cramp 08/01/2015  . GERD (gastroesophageal reflux disease) 07/09/2015  . Excessive daytime sleepiness 06/18/2015  . Shingles 08/28/2013  . Postop check 08/28/2013  . Cholelithiasis 07/10/2013  . Colitis 01/08/2013  . Postoperative anemia 01/08/2013  . Morbid obesity with BMI of 40.0-44.9, adult ([place]) 01/08/2013    Past Surgical History:  Procedure Laterality Date  . BACK SURGERY  07/21/1978  . CHOLECYSTECTOMY N/A 08/15/2013   Procedure: LAPAROSCOPIC CHOLECYSTECTOMY WITH INTRAOPERATIVE CHOLANGIOGRAM;  Surgeon: Gwenyth Ober, MD;  Location: [facility];  Service: General;  Laterality: N/A;  . COLONOSCOPY N/A 01/09/2013   Procedure: COLONOSCOPY;  Surgeon: Beryle Beams, MD;  Location: [facility];  Service: Endoscopy;  Laterality: N/A;  . HERNIA REPAIR Left 1981  . TONSILLECTOMY    . TUBAL LIGATION  06/1988    OB History    No data available       Home Medications    Prior to Admission medications   Medication Sig Start Date  End Date Taking? Authorizing Provider  amLODipine (NORVASC) 10 MG tablet TAKE ONE TABLET BY MOUTH ONCE A DAY 06/15/16   Golden Circle, FNP  baclofen (LIORESAL) 10 MG tablet TAKE ONE TABLET BY MOUTH TWICE DAILY TO THREE TIMES DAILY AS NEEDED FOR  SPASM 08/02/16   Leandrew Koyanagi, MD  diclofenac (VOLTAREN) 75 MG EC tablet TAKE ONE TABLET BY MOUTH TWICE DAILY WITH FOOD AS NEEDED FOR PAIN 08/02/16   Mcarthur Rossetti, MD  Eluxadoline (VIBERZI) 100 MG TABS Take 1 tablet (100 mg total) by mouth 2 (two) times daily. 05/21/16   Golden Circle, FNP  gabapentin (NEURONTIN) 300 MG capsule Take 1 capsule (300 mg total) by mouth 3 (three) times daily. 05/04/16   Golden Circle, FNP   indomethacin (INDOCIN) 25 MG capsule Take 1 capsule by mouth 30-60 minutes prior to sexual activity. 05/14/16   Golden Circle, FNP  lisinopril (PRINIVIL,ZESTRIL) 40 MG tablet Take 1 tablet (40 mg total) by mouth daily. 05/04/16   Golden Circle, FNP  loperamide (IMODIUM) 2 MG capsule Take 2-4 mg by mouth 2 (two) times daily as needed for diarrhea or loose stools.     [provider]  pantoprazole (PROTONIX) 40 MG tablet Take 1 tablet (40 mg total) by mouth daily. 05/04/16   Golden Circle, FNP  predniSONE (DELTASONE) 10 MG tablet Take 4 tablets po 01/27/2016, Take 3 and 1/2 tablets po 01/28/2016, Take 3 tablets po 1/11/2018m Take 2 and 1/2 tablets po 01/30/2016, Take 2 tablets po 01/31/2016, Take 1 and 1/2 tablets po 02/01/2016, Take 1 tablet po 02/02/2016, Then take 1/2 tablet po on 02/03/2016. 01/26/16   Jessy Oto, MD  predniSONE (DELTASONE) 5 MG tablet Take 8 tablets (40 mg total) by mouth daily with breakfast. 01/26/16   Robbie Lis, MD  SUMAtriptan (IMITREX) 100 MG tablet Take 0.5-1 tablet by mouth at the onset of migraine and may repeat in 2 hours if headache persists or recurs. 05/14/16   Golden Circle, FNP  tiZANidine (ZANAFLEX) 4 MG tablet TAKE 1 TABLET BY MOUTH EVERY 6 HOURS AS NEEDED 05/12/16   Jessy Oto, MD  traMADol (ULTRAM) 50 MG tablet Take 2 tablets (100 mg total) by mouth every 6 (six) hours as needed. 07/14/16   Golden Circle, FNP  traMADol (ULTRAM) 50 MG tablet Take 1 tablet (50 mg total) by mouth every 12 (twelve) hours as needed. 08/05/16   Lanae Crumbly, PA-C  traMADol (ULTRAM) 50 MG tablet Take 1 tablet (50 mg total) by mouth every 12 (twelve) hours as needed. 09/22/16   Jessy Oto, MD    Family History Family History  Problem Relation Age of Onset  . Heart disease Father   . Heart attack Father   . Hypertension Mother   . Arthritis Mother   . Diabetes Maternal Grandmother   . Diabetes Paternal Grandmother   . Diabetes Maternal Uncle      Social History Social History  Substance Use Topics  . Smoking status: Never Smoker  . Smokeless tobacco: Never Used  . Alcohol use 4.2 oz/week    7 Cans of beer per week     Comment: 1 beer daily     Allergies   Other and Sulfur   Review of Systems Review of Systems  Constitutional: Negative for fever.  Respiratory: Positive for cough.   Gastrointestinal: Positive for vomiting. Negative for abdominal pain and diarrhea.  All other systems reviewed and are  negative.    Physical Exam Updated Vital Signs BP 111/77   Pulse 90   Temp 99.3 F (37.4 C) (Oral)   Resp 16   SpO2 99%   Physical Exam  CONSTITUTIONAL: Chronically ill appearing HEAD: Normocephalic/atraumatic EYES: EOMI/PERRL ENMT: Mucous membranes dry, thrush noted on tongue NECK: supple no meningeal signs, trach in place, no blood noted CV: S1/S2 noted, no murmurs/rubs/gallops noted LUNGS: Lungs are clear to auscultation bilaterally, no apparent distress ABDOMEN: soft, nontender, no rebound or guarding, bowel sounds noted throughout abdomen, PEG Tube noted, no drainage or bleeding  NEURO: Pt is awake/alert/appropriate, moves all extremitiesx4.  No facial droop.   EXTREMITIES: pulses normal/equal, full ROM SKIN: warm, color normal PSYCH: no abnormalities of mood noted, alert and oriented to situation  ED Treatments / Results  Labs (all labs ordered are listed, but only abnormal results are displayed) Labs Reviewed  COMPREHENSIVE METABOLIC PANEL - Abnormal; Notable for the following:       Result Value   Glucose, Bld 104 (*)    BUN 29 (*)    Alkaline Phosphatase 135 (*)    All other components within normal limits  CBC - Abnormal; Notable for the following:    Hemoglobin 10.9 (*)    HCT 34.7 (*)    All other components within normal limits  URINALYSIS, ROUTINE W REFLEX MICROSCOPIC - Abnormal; Notable for the following:    Leukocytes, UA TRACE (*)    Bacteria, UA MANY (*)    Squamous Epithelial  / LPF 0-5 (*)    All other components within normal limits  LIPASE, BLOOD  TROPONIN I    EKG  EKG Interpretation  Date/Time:  Thursday October 07 2016 02:54:12 EDT Ventricular Rate:  91 PR Interval:    QRS Duration: 92 QT Interval:  386 QTC Calculation: 475 R Axis:   -33 Text Interpretation:  Sinus rhythm Atrial premature complex Abnormal R-wave progression, early transition Left ventricular hypertrophy Baseline wander in lead(s) II III aVF V6 No significant change since last tracing Confirmed by Ripley Fraise [phone]) on 10/07/2016 2:56:46 AM       Radiology Dg Abd Acute W/chest  Result Date: 10/07/2016 CLINICAL DATA:  Vomiting EXAM: DG ABDOMEN ACUTE W/ 1V CHEST COMPARISON:  02/16/2016 FINDINGS: Single-view chest demonstrates interim placement of tracheostomy tube, the tip is about 3.4 cm superior to the carina. Minimal atelectasis at the left base. No consolidation or effusion. Stable borderline to mild enlargement of the cardiomediastinal silhouette. Supine and upright views of the abdomen demonstrate no free air beneath the diaphragm. Surgical clips in the right upper quadrant. Nonobstructed bowel-gas pattern. Scoliosis of the thoracolumbar spine with long go right spinal fixation rod over the thoracolumbar stabilization rods and fixating screws at the lower lumbar spine. Discontinuity between the long right surgical rod and remaining rod and screws at the lower lumbar spine is re- demonstrated. IMPRESSION: 1. Low lung volumes. 2. Nonobstructed bowel-gas pattern Electronically Signed   By: Donavan Foil M.D.   On: 10/07/2016 03:47    Procedures Procedures  Medications Ordered in ED Medications  ondansetron (ZOFRAN) injection 4 mg (4 mg Intravenous Given 10/07/16 0304)  sodium chloride 0.9 % bolus 1,000 mL (0 mLs Intravenous Stopped 10/07/16 0545)     Initial Impression / Assessment and Plan / ED Course  I have reviewed the triage vital signs and the nursing  notes.  Pertinent labs & imaging results that were available during my care of the patient were reviewed by me and considered  in my medical decision making (see chart for details).     Pt in the ED for nausea/vomiting No pain complaints Imaging negative Suspect this was related to recent use of diclofenac Vitals appropriate On recheck, she was sleeping No focal ABD Tenderness to suggest obstruction or other acute ABD emergency No CP to suggest ACS  Will d/c home   Final Clinical Impressions(s) / ED Diagnoses   Final diagnoses:  Non-intractable cyclical vomiting with nausea  Dehydration    New Prescriptions New Prescriptions   No medications on file     Ripley Fraise, MD 10/07/16 [phone]

## 2016-10-08 NOTE — Telephone Encounter (Signed)
Please advise 

## 2016-10-10 ENCOUNTER — Encounter (HOSPITAL_COMMUNITY): Payer: Self-pay

## 2016-10-10 ENCOUNTER — Emergency Department (HOSPITAL_COMMUNITY)
Admission: EM | Admit: 2016-10-10 | Discharge: 2016-10-11 | Disposition: A | Discharge status: Home / Self Care | Payer: BC Managed Care – PPO | Attending: Emergency Medicine | Admitting: Emergency Medicine

## 2016-10-10 DIAGNOSIS — Z931 Gastrostomy status: Secondary | ICD-10-CM | POA: Insufficient documentation

## 2016-10-10 DIAGNOSIS — R6889 Other general symptoms and signs: Secondary | ICD-10-CM

## 2016-10-10 DIAGNOSIS — D649 Anemia, unspecified: Secondary | ICD-10-CM | POA: Diagnosis not present

## 2016-10-10 DIAGNOSIS — Z79899 Other long term (current) drug therapy: Secondary | ICD-10-CM | POA: Diagnosis not present

## 2016-10-10 DIAGNOSIS — I10 Essential (primary) hypertension: Secondary | ICD-10-CM | POA: Insufficient documentation

## 2016-10-10 NOTE — ED Triage Notes (Signed)
Pt presents due to g tube coming out. Bleeding controlled and patient asymptomatic.

## 2016-10-10 NOTE — ED Provider Notes (Signed)
[facility] DEPT Provider Note   CSN: [id number] Arrival date & time: 10/10/16  1426     History   Chief Complaint Chief Complaint  Patient presents with  . g tube out    HPI Kristen [name] is a 51 y.o. female.  HPI   Kristen [name] is a 51 y.o. 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  48F G-tube was originally placed on 09/27/2016 in conjunction with her partial glossectomy performed at [facility] by Dr. Francina Ames.  Denies nausea, vomiting, abdominal pain, significant bleeding, or any other complaints.    Past Medical History:  Diagnosis Date  . Allergy   . Anemia    Iron deficiency  . Anxiety   . Arthritis    back, left shoulder  . Chicken pox   . Depression   . Diarrhea    takes Imodium daily  . GERD (gastroesophageal reflux disease)   . H/O hiatal hernia   . Headache(784.0)   . History of kidney stones    1996ish  . Hypertension   . Migraine    none for 5 years (as of 01/13/16)  . OSA (obstructive sleep apnea) 10/13/2015   unable to get cpap, plans to get one in 2018  . Pneumonia   . Restless legs   . Scoliosis   . Shortness of breath    with exertion    Patient Active Problem List   Diagnosis Date Noted  . Headache associated with sexual activity 05/14/2016  . Tongue lesion 05/14/2016  . Loose stools 03/12/2016  . Hypocalcemia 01/22/2016  . Thrombocytopenia ([place]) 01/22/2016  . Chronic diarrhea 01/22/2016  . Chest pain   . Essential hypertension   . Orthostatic hypotension   . Spinal stenosis of lumbar region 01/20/2016    Class: Chronic  . DDD (degenerative disc disease), lumbar 01/20/2016    Class: Chronic  . Spinal stenosis of lumbar region with neurogenic claudication 01/20/2016  . Low back pain 12/22/2015  . Pre-op examination 12/22/2015  . OSA (obstructive  sleep apnea) 10/13/2015  . Muscle cramp 08/01/2015  . GERD (gastroesophageal reflux disease) 07/09/2015  . Excessive daytime sleepiness 06/18/2015  . Shingles 08/28/2013  . Postop check 08/28/2013  . Cholelithiasis 07/10/2013  . Colitis 01/08/2013  . Postoperative anemia 01/08/2013  . Morbid obesity with BMI of 40.0-44.9, adult ([place]) 01/08/2013    Past Surgical History:  Procedure Laterality Date  . BACK SURGERY  07/21/1978  . CHOLECYSTECTOMY N/A 08/15/2013   Procedure: LAPAROSCOPIC CHOLECYSTECTOMY WITH INTRAOPERATIVE CHOLANGIOGRAM;  Surgeon: Gwenyth Ober, MD;  Location: [facility];  Service: General;  Laterality: N/A;  . COLONOSCOPY N/A 01/09/2013   Procedure: COLONOSCOPY;  Surgeon: Beryle Beams, MD;  Location: [facility];  Service: Endoscopy;  Laterality: N/A;  . HERNIA REPAIR Left 1981  . TONSILLECTOMY    . TUBAL LIGATION  06/1988    OB History    No data available       Home Medications    Prior to Admission medications   Medication Sig Start Date End Date Taking? Authorizing Provider  amLODipine (NORVASC) 10 MG tablet TAKE ONE TABLET BY MOUTH ONCE A DAY 06/15/16  Yes Golden Circle, FNP  baclofen (LIORESAL) 10 MG tablet TAKE ONE TABLET BY MOUTH TWICE DAILY TO THREE TIMES DAILY AS NEEDED FOR  SPASM 08/02/16  Yes Erlinda Hong, [place]  M, MD  diclofenac (VOLTAREN) 75 MG EC tablet TAKE ONE TABLET BY MOUTH TWICE DAILY WITH FOOD AS NEEDED FOR PAIN 08/02/16  Yes Mcarthur Rossetti, MD  Eluxadoline (VIBERZI) 100 MG TABS Take 1 tablet (100 mg total) by mouth 2 (two) times daily. 05/21/16  Yes Golden Circle, FNP  gabapentin (NEURONTIN) 300 MG capsule Take 1 capsule (300 mg total) by mouth 3 (three) times daily. 05/04/16  Yes Golden Circle, FNP  indomethacin (INDOCIN) 25 MG capsule Take 1 capsule by mouth 30-60 minutes prior to sexual activity. 05/14/16  Yes Golden Circle, FNP  lisinopril (PRINIVIL,ZESTRIL) 40 MG tablet Take 1 tablet (40 mg total) by mouth daily. 05/04/16  Yes Golden Circle, FNP  loperamide (IMODIUM) 2 MG capsule Take 2-4 mg by mouth 2 (two) times daily as needed for diarrhea or loose stools.    Yes [provider]  pantoprazole (PROTONIX) 40 MG tablet Take 1 tablet (40 mg total) by mouth daily. 05/04/16  Yes Golden Circle, FNP  SUMAtriptan (IMITREX) 100 MG tablet Take 0.5-1 tablet by mouth at the onset of migraine and may repeat in 2 hours if headache persists or recurs. 05/14/16  Yes Golden Circle, FNP  tiZANidine (ZANAFLEX) 4 MG tablet TAKE 1 TABLET BY MOUTH EVERY 6 HOURS AS NEEDED 05/12/16  Yes Jessy Oto, MD  traMADol (ULTRAM) 50 MG tablet Take 1 tablet (50 mg total) by mouth every 12 (twelve) hours as needed. 08/05/16  Yes Lanae Crumbly, PA-C  predniSONE (DELTASONE) 10 MG tablet Take 4 tablets po 01/27/2016, Take 3 and 1/2 tablets po 01/28/2016, Take 3 tablets po 1/11/2018m Take 2 and 1/2 tablets po 01/30/2016, Take 2 tablets po 01/31/2016, Take 1 and 1/2 tablets po 02/01/2016, Take 1 tablet po 02/02/2016, Then take 1/2 tablet po on 02/03/2016. Patient not taking: Reported on 10/10/2016 01/26/16   Jessy Oto, MD  predniSONE (DELTASONE) 5 MG tablet Take 8 tablets (40 mg total) by mouth daily with breakfast. Patient not taking: Reported on 10/10/2016 01/26/16   Robbie Lis, MD  traMADol (ULTRAM) 50 MG tablet Take 2 tablets (100 mg total) by mouth every 6 (six) hours as needed. Patient not taking: Reported on 10/10/2016 07/14/16   Golden Circle, FNP  traMADol (ULTRAM) 50 MG tablet Take 1 tablet (50 mg total) by mouth every 12 (twelve) hours as needed. Patient not taking: Reported on 10/10/2016 09/22/16   Jessy Oto, MD    Family History Family History  Problem Relation Age of Onset  . Heart disease Father   . Heart attack Father   . Hypertension Mother   . Arthritis Mother   . Diabetes Maternal Grandmother   . Diabetes Paternal Grandmother   . Diabetes Maternal Uncle     Social History Social History  Substance Use Topics  .  Smoking status: Never Smoker  . Smokeless tobacco: Never Used  . Alcohol use 4.2 oz/week    7 Cans of beer per week     Comment: 1 beer daily     Allergies   Other and Sulfur   Review of Systems Review of Systems  Constitutional: Negative for chills and fever.  Respiratory: Negative for shortness of breath.   Cardiovascular: Negative for chest pain.  Gastrointestinal: Negative for abdominal pain, nausea and vomiting.       G-tube dislodgement.     Physical Exam Updated Vital Signs BP 109/73   Pulse 91   Temp 98.2 F (  36.8 C) (Oral)   Resp 18   SpO2 96%   Physical Exam  Constitutional: She appears well-developed and well-nourished. No distress.  HENT:  Head: Normocephalic and atraumatic.  Eyes: Conjunctivae are normal.  Neck: Neck supple.  Cardiovascular: Normal rate, regular rhythm, normal heart sounds and intact distal pulses.   Pulmonary/Chest: Effort normal and breath sounds normal. No respiratory distress.  Abdominal: Soft. Bowel sounds are normal. There is no tenderness. There is no guarding.  G-tube completely dislodged. No signs of active external hemorrhage. No noted erythema or edema surrounding the site. No purulent discharge noted.  Musculoskeletal: She exhibits no edema.  Lymphadenopathy:    She has no cervical adenopathy.  Neurological: She is alert.  Skin: Skin is warm and dry. Capillary refill takes less than 2 seconds. She is not diaphoretic. No pallor.  Psychiatric: She has a normal mood and affect. Her behavior is normal.  Nursing note and vitals reviewed.    ED Treatments / Results  Labs (all labs ordered are listed, but only abnormal results are displayed) Labs Reviewed - No data to display  EKG  EKG Interpretation None       Radiology Dg Abdomen 1 View  Result Date: 10/11/2016 CLINICAL DATA:  G-tube replacement with contrast. EXAM: ABDOMEN - 1 VIEW COMPARISON:  10/07/2016 FINDINGS: Percutaneous gastrostomy tube balloon is seen  within the lumen of the stomach. Enteric contrast is seen in the fundus of the stomach as well as antegrade within the duodenum sweep. No extravasation of contrast is noted. Levoscoliosis of the thoracolumbar spine with lumbar spinal fusion and Harrington rod fixation is noted. IMPRESSION: Gastric tube noted within the lumen of the stomach. Electronically Signed   By: Ashley Royalty M.D.   On: 10/11/2016 02:42    Procedures Gastrostomy tube replacement Date/Time: 10/11/2016 12:35 AM Performed by: Lorayne Bender Authorized by: Arlean Hopping C  Consent: Verbal consent obtained. Risks and benefits: risks, benefits and alternatives were discussed Consent given by: patient Patient understanding: patient states understanding of the procedure being performed Patient consent: the patient's understanding of the procedure matches consent given Procedure consent: procedure consent matches procedure scheduled Required items: required blood products, implants, devices, and special equipment available Patient identity confirmed: verbally with patient, arm band and provided demographic data Time out: Immediately prior to procedure a "time out" was called to verify the correct patient, procedure, equipment, support staff and site/side marked as required. Preparation: Patient was prepped and draped in the usual sterile fashion. Local anesthesia used: no  Anesthesia: Local anesthesia used: no  Sedation: Patient sedated: no Patient tolerance: Patient tolerated the procedure well with no immediate complications Comments: Patient previously had 18 [place] G-tube in place. Was unable to replace the tube with another 32F tube. Was able to successfully place 57F tube without significant discomfort patient. No hemorrhage noted. Balloon was inflated per tube instructions.    (including critical care time)  Medications Ordered in ED Medications  iopamidol (ISOVUE-300) 61 % injection (not administered)     Initial  Impression / Assessment and Plan / ED Course  I have reviewed the triage vital signs and the nursing notes.  Pertinent labs & imaging results that were available during my care of the patient were reviewed by me and considered in my medical decision making (see chart for details).  Clinical Course as of Oct 11 516  Sun Oct 10, 2016  2132 Contacted RT to review trach education with patient and her mother at the bedside.   [  SJ]    Clinical Course User Index [SJ] Joy, Shawn C, PA-C   Patient presents with complaint of dislodged G-tube. This was able to be replaced in the ED. Placement confirmed on x-ray. Patient to follow up with her surgeon on this matter. Provided with G-tube education. The patient was given instructions for home care as well as return precautions. Patient voices understanding of these instructions, accepts the plan, and is comfortable with discharge.  Findings and plan of care discussed with Brantley Stage, MD.   Vitals:   10/10/16 2250 10/11/16 0254 10/11/16 0256 10/11/16 0257  BP:  103/79 103/79   Pulse: 94 95 100   Resp: 18  16   Temp:    98.6 F (37 C)  TempSrc:    Oral  SpO2: 97% 100% 100%       Final Clinical Impressions(s) / ED Diagnoses   Final diagnoses:  Complaint associated with gastric tube [facility])    New Prescriptions Discharge Medication List as of 10/11/2016  2:50 AM       Lorayne Bender, PA-C 10/11/16 0518    Forde Dandy, MD 10/11/16 1359

## 2016-10-10 NOTE — ED Notes (Signed)
Requisition sent for g-tube.

## 2016-10-10 NOTE — Progress Notes (Addendum)
RT to called to provide trach education. Patient has # 4 [place], cuffless, trach. Suctioned pt. Trach care done. Inner cannula contained, thick, dried, yellow secretions. Placed on humidity 21% ATC.

## 2016-10-11 ENCOUNTER — Encounter: Payer: Self-pay | Admitting: Radiation Oncology

## 2016-10-11 ENCOUNTER — Emergency Department (HOSPITAL_COMMUNITY): Payer: BC Managed Care – PPO

## 2016-10-11 MED ORDER — IOPAMIDOL (ISOVUE-300) INJECTION 61%
INTRAVENOUS | Status: AC
  Filled 2016-10-11: qty 50

## 2016-10-11 NOTE — ED Notes (Signed)
Patient transported to X-ray 

## 2016-10-11 NOTE — Discharge Instructions (Addendum)
You have been seen today for dislodgment of your gastric tube. This was replaced, but a slightly smaller size was used out of necessity. Please follow-up with your surgeon on this matter as you may need a larger tube placed. Proper placement of the G-tube was confirmed by x-ray.

## 2016-10-13 ENCOUNTER — Telehealth: Payer: Self-pay | Admitting: Family

## 2016-10-13 NOTE — Telephone Encounter (Signed)
Please find out if a nutrition consult has been performed to determine her protein need and if she still needs the oral medication as she was recently seen in the ED.

## 2016-10-13 NOTE — Telephone Encounter (Signed)
Amy, from [facility]  [phone] cell    Pt bp yesterday around 5pm 84/60 She did take her bp meds that morning Pt is is on amlodipine 58m   Pt had an appt this morning  With speech therapist  Need to see if they need to revisit her bp meds

## 2016-10-14 ENCOUNTER — Encounter (INDEPENDENT_AMBULATORY_CARE_PROVIDER_SITE_OTHER): Payer: Self-pay | Admitting: Surgery

## 2016-10-14 ENCOUNTER — Ambulatory Visit (INDEPENDENT_AMBULATORY_CARE_PROVIDER_SITE_OTHER): Payer: BC Managed Care – PPO | Admitting: Surgery

## 2016-10-14 DIAGNOSIS — Z981 Arthrodesis status: Secondary | ICD-10-CM

## 2016-10-14 NOTE — Telephone Encounter (Signed)
Please hold blood pressure meds and check blood pressure twice daily. Follow up with numbers in 1-2 days. If remains low off of medications may need to go to the ED.

## 2016-10-14 NOTE — Telephone Encounter (Signed)
Spoke with Amy from [facility] and informed of response below. She said someone would go to pts house tomorrow to check on her and will call with readings then.

## 2016-10-14 NOTE — Progress Notes (Signed)
Office Visit Note   Patient: Kristen [name]           Date of Birth: [birth year]           MRN: [id number] Visit Date: 10/14/2016              Requested by: [address], [place], [place] [address] PCP: [facility], FNP   Assessment & Plan: Visit Diagnoses:  1. Status post lumbar spinal fusion     Plan: Patient doing well at this point and advised them very pleased with her progress. We'll follow-up in a few months for recheck when she is one year out from surgery. If patient continues to have the intermittent numbness and tingling in the left upper extremity she will return sooner for evaluation. All questions answered.  Follow-Up Instructions: Return in about 4 months (around 02/13/2017).   Orders:  No orders of the defined types were placed in this encounter.  No orders of the defined types were placed in this encounter.     Procedures: No procedures performed   Clinical Data: No additional findings.   Subjective: No chief complaint on file.   HPI Patient returns. Status post Removal of rod at right L2 to L3, Transforaminal lumbar interbody fusion right L3-4 and right L4-5 with pedicle screws and rods L2, L3, L4 and L5, local bone graft, allograft and Vivigen January 2018. States that she is doing well and pleased with her surgical result up to this point. No complaints of pain numbness and tingling in her legs. She does state that over the last couple weeks she's had intermittent numbness and tingling in the left upper extremity. Patient recently had partial glossectomy at [facility]. Review of Systems No current cardiac issues.    Objective: Vital Signs: There were no vitals taken for this visit.  Physical Exam  Constitutional: She is oriented to person, place, and time. No distress.  HENT:  Head: Normocephalic.  Eyes: Pupils are equal, round, and reactive to light. EOM are normal.  Musculoskeletal:  Lower  lumbar nontender. She is ambulating without difficulty. She has a positive Tinel's over the left cubital tunnel.  Neurological: She is alert and oriented to person, place, and time.  Skin: Skin is warm and dry.  Psychiatric: She has a normal mood and affect.    Ortho Exam  Specialty Comments:  No specialty comments available.  Imaging: No results found.   PMFS History: Patient Active Problem List   Diagnosis Date Noted  . Headache associated with sexual activity 05/14/2016  . Tongue lesion 05/14/2016  . Loose stools 03/12/2016  . Hypocalcemia 01/22/2016  . Thrombocytopenia ([place]) 01/22/2016  . Chronic diarrhea 01/22/2016  . Chest pain   . Essential hypertension   . Orthostatic hypotension   . Spinal stenosis of lumbar region 01/20/2016    Class: Chronic  . DDD (degenerative disc disease), lumbar 01/20/2016    Class: Chronic  . Spinal stenosis of lumbar region with neurogenic claudication 01/20/2016  . Low back pain 12/22/2015  . Pre-op examination 12/22/2015  . OSA (obstructive sleep apnea) 10/13/2015  . Muscle cramp 08/01/2015  . GERD (gastroesophageal reflux disease) 07/09/2015  . Excessive daytime sleepiness 06/18/2015  . Shingles 08/28/2013  . Postop check 08/28/2013  . Cholelithiasis 07/10/2013  . Colitis 01/08/2013  . Postoperative anemia 01/08/2013  . Morbid obesity with BMI of 40.0-44.9, adult ( [place]) 01/08/2013   Past Medical History:  Diagnosis Date  .  Allergy   . Anemia    Iron deficiency  . Anxiety   . Arthritis    back, left shoulder  . Chicken pox   . Depression   . Diarrhea    takes Imodium daily  . GERD (gastroesophageal reflux disease)   . H/O hiatal hernia   . Headache(784.0)   . History of kidney stones    1996ish  . Hypertension   . Migraine    none for 5 years (as of 01/13/16)  . OSA (obstructive sleep apnea) 10/13/2015   unable to get cpap, plans to get one in 2018  . Pneumonia   . Restless legs   . Scoliosis   . Shortness of  breath    with exertion    Family History  Problem Relation Age of Onset  . Heart disease Father   . Heart attack Father   . Hypertension Mother   . Arthritis Mother   . Diabetes Maternal Grandmother   . Diabetes Paternal Grandmother   . Diabetes Maternal Uncle     Past Surgical History:  Procedure Laterality Date  . BACK SURGERY  07/21/1978  . CHOLECYSTECTOMY N/A 08/15/2013   Procedure: LAPAROSCOPIC CHOLECYSTECTOMY WITH INTRAOPERATIVE CHOLANGIOGRAM;  Surgeon: Gwenyth Ober, MD;  Location: [facility];  Service: General;  Laterality: N/A;  . COLONOSCOPY N/A 01/09/2013   Procedure: COLONOSCOPY;  Surgeon: Beryle Beams, MD;  Location: [facility];  Service: Endoscopy;  Laterality: N/A;  . HERNIA REPAIR Left 1981  . TONSILLECTOMY    . TUBAL LIGATION  06/1988   Social History   Occupational History  . Safety Assistant    Social History Main Topics  . Smoking status: Never Smoker  . Smokeless tobacco: Never Used  . Alcohol use 4.2 oz/week    7 Cans of beer per week     Comment: 1 beer daily  . Drug use: No  . Sexual activity: Yes    Birth control/ protection: Surgical

## 2016-10-18 ENCOUNTER — Other Ambulatory Visit: Payer: Self-pay | Admitting: Radiation Oncology

## 2016-10-19 ENCOUNTER — Telehealth: Payer: Self-pay | Admitting: *Deleted

## 2016-10-19 NOTE — Telephone Encounter (Signed)
Oncology Nurse Navigator Documentation  Placed introductory call to new referral patient Kristen [name].  Introduced myself as the H&N oncology nurse navigator that works with Dr. Isidore Moos to whom she has been referred by Dr.Wal[place]en, [facility].  She confirmed her understanding of referral and appt date/time of 10/26/16 08:30.  I encouraged 0800 arrival.  I briefly explained my role as her navigator, indicated that I would be joining her  during her appt next week.  I confirmed her understanding of [place] location, explained arrival and RadOnc registration process for appt.  I provided my contact information, encouraged her to call with questions/concerns before next week.  She verbalized understanding of information provided, expressed appreciation for my call.  Gayleen Orem, RN, BSN, [place] Oncology Nurse [place] [facility] [place] [phone]

## 2016-10-21 ENCOUNTER — Other Ambulatory Visit: Payer: Self-pay | Admitting: *Deleted

## 2016-10-21 ENCOUNTER — Telehealth: Payer: Self-pay | Admitting: *Deleted

## 2016-10-21 DIAGNOSIS — C01 Malignant neoplasm of base of tongue: Secondary | ICD-10-CM

## 2016-10-21 DIAGNOSIS — C760 Malignant neoplasm of head, face and neck: Secondary | ICD-10-CM

## 2016-10-21 NOTE — Telephone Encounter (Signed)
Called patient to inform of Ct for 10-25-16- arrival time - 4:15 pm, pt. to have clear liquids only - 4 hrs. prior to test, test to be @ [facility], spoke with patient and she is aware of this test

## 2016-10-21 NOTE — Progress Notes (Addendum)
Head and Neck Cancer Location of Tumor / Histology: 08/27/16  LEFT POSTERIOR TONGUE MASS, BIOPSY: Adenoid cystic carcinoma, cribriform pattern. Adenoid cystic carcinoma involves the edges of the biopsy Specimen.  09/27/16 .ANTERIOR MARGIN, EXCISION: No malignancy identified. B.LATERAL MARGIN, EXCISION: No malignancy identified. C.POSTERIOR MARGIN, EXCISION: No malignancy identified. D.MEDIAL MARGIN, EXCISION: No malignancy identified. E.LYMPH NODES, LEFT LEVEL II NECK, DISSECTION: Fourteen benign lymph nodes (0/14). F.LYMPH NODES, LEFT SUBMANDIBULAR, DISSECTION: Two benign lymph nodes (0/2). Unremarkable salivary gland. G.LYMPH NODES, SUBMENTAL CONTENTS, DISSECTION: Four benign lymph nodes (0/4). H.LEFT POSTERIOR TONGUE, PARTIAL GLOSSECTOMY: Adenoid cystic carcinoma, 4.6 cm in greatest extent. Carcinoma present focally at the lateral edge of this specimen (lateral margin sent separately as specimen B, is negative for carcinoma). Perineural invasion present. Lymphovascular invasion not identified  Patient presented with symptoms of: She had an MRI in 04/2016 to evaluate headaches and it demonstrated a base of tongue mass.   Biopsies of Left posterior tongue mass revealed: Adenoid cystic carcinoma, cribriform pattern.   Nutrition Status Yes No Comments  Weight changes? [x]  []  She has lost 30lbs since her surgery in September. She had not lost weight before her surgery.   Swallowing concerns? [x]  []  Her swallowing has been improving since surgery. She is able to chew foods on the right side of her mouth. She is mostly eating softer foods.   PEG? [x]  []  Placed 09/27/16 during surgery, She is instilling 4 cartons daily. She is also instilling extra water daily via her PEG   Referrals Yes No Comments  Social Work? []  [x]    Dentistry? []  [x]    Swallowing therapy? [x]  []  She is seeing a therapist at  [facility]  Nutrition? []  [x]    Med/Onc? []  [x]     Safety Issues Yes No Comments  Prior radiation? []  [x]    Pacemaker/ICD? []  [x]    Possible current pregnancy? []  [x]  She is postmenopausal X 3 years  Is the patient on methotrexate? []  [x]     Tobacco/Marijuana/Snuff/ETOH use: She has never smoked or used smokeless tobacco. She drinks one beer daily.   Past/Anticipated interventions by otolaryngology, if any:  Dr. Nicolette Bang Procedures/Surgeries performed during hospitalization:  09/27/16: Resection of base of tongue cancer, including left hemi-glossectomy, pharyngoplasty, left selective neck dissection (levels 1 and 2), tracheotomy. 09/27/16: G-tube   Past/Anticipated interventions by medical oncology, if any: None    Current Complaints / other details:  She has a non-productive cough that comes and goes.  She reports she has an appointment with Dr. Nicolette Bang on 11/03/16. She tells me that they will discuss having her trach removed.   BP (!) 131/98   Pulse 81   Temp 98.9 F (37.2 C)   Ht 5' 9"  (1.753 m)   Wt 247 lb 6.4 oz (112.2 kg)   LMP 11/15/2013   SpO2 98% Comment: room air  BMI 36.53 kg/m    Wt Readings from Last 3 Encounters:  10/26/16 247 lb 6.4 oz (112.2 kg)  08/05/16 279 lb (126.6 kg)  05/14/16 274 lb (124.3 kg)

## 2016-10-25 ENCOUNTER — Ambulatory Visit (HOSPITAL_COMMUNITY)
Admission: RE | Admit: 2016-10-25 | Discharge: 2016-10-25 | Disposition: A | Discharge status: Home / Self Care | Payer: BC Managed Care – PPO | Source: Ambulatory Visit | Attending: Radiation Oncology | Admitting: Radiation Oncology

## 2016-10-25 ENCOUNTER — Encounter (HOSPITAL_COMMUNITY): Payer: Self-pay | Admitting: Radiology

## 2016-10-25 DIAGNOSIS — I517 Cardiomegaly: Secondary | ICD-10-CM | POA: Insufficient documentation

## 2016-10-25 DIAGNOSIS — C01 Malignant neoplasm of base of tongue: Secondary | ICD-10-CM | POA: Insufficient documentation

## 2016-10-25 DIAGNOSIS — C760 Malignant neoplasm of head, face and neck: Secondary | ICD-10-CM | POA: Diagnosis present

## 2016-10-25 MED ORDER — IOPAMIDOL (ISOVUE-300) INJECTION 61%
INTRAVENOUS | Status: AC
  Filled 2016-10-25: qty 75

## 2016-10-25 MED ORDER — IOPAMIDOL (ISOVUE-300) INJECTION 61%
75.0000 mL | Freq: Once | INTRAVENOUS | Status: AC | PRN
  Administered 2016-10-25: 75 mL via INTRAVENOUS

## 2016-10-26 ENCOUNTER — Encounter: Payer: Self-pay | Admitting: Radiation Oncology

## 2016-10-26 ENCOUNTER — Ambulatory Visit: Payer: BC Managed Care – PPO | Admitting: Nutrition

## 2016-10-26 ENCOUNTER — Encounter: Payer: Self-pay | Admitting: *Deleted

## 2016-10-26 ENCOUNTER — Ambulatory Visit
Admission: RE | Admit: 2016-10-26 | Discharge: 2016-10-26 | Disposition: A | Discharge status: Home / Self Care | Payer: BC Managed Care – PPO | Source: Ambulatory Visit | Attending: Radiation Oncology | Admitting: Radiation Oncology

## 2016-10-26 DIAGNOSIS — Z9889 Other specified postprocedural states: Secondary | ICD-10-CM | POA: Insufficient documentation

## 2016-10-26 DIAGNOSIS — Z9049 Acquired absence of other specified parts of digestive tract: Secondary | ICD-10-CM | POA: Insufficient documentation

## 2016-10-26 DIAGNOSIS — C01 Malignant neoplasm of base of tongue: Secondary | ICD-10-CM | POA: Diagnosis not present

## 2016-10-26 DIAGNOSIS — C0680 Malignant neoplasm of overlapping sites of unspecified parts of mouth: Secondary | ICD-10-CM | POA: Diagnosis not present

## 2016-10-26 DIAGNOSIS — Z8249 Family history of ischemic heart disease and other diseases of the circulatory system: Secondary | ICD-10-CM | POA: Insufficient documentation

## 2016-10-26 DIAGNOSIS — Z79891 Long term (current) use of opiate analgesic: Secondary | ICD-10-CM | POA: Diagnosis not present

## 2016-10-26 DIAGNOSIS — Z833 Family history of diabetes mellitus: Secondary | ICD-10-CM | POA: Insufficient documentation

## 2016-10-26 DIAGNOSIS — Z510 Encounter for antineoplastic radiation therapy: Secondary | ICD-10-CM | POA: Insufficient documentation

## 2016-10-26 DIAGNOSIS — C760 Malignant neoplasm of head, face and neck: Secondary | ICD-10-CM | POA: Diagnosis not present

## 2016-10-26 DIAGNOSIS — K148 Other diseases of tongue: Secondary | ICD-10-CM

## 2016-10-26 DIAGNOSIS — Z79899 Other long term (current) drug therapy: Secondary | ICD-10-CM | POA: Insufficient documentation

## 2016-10-26 DIAGNOSIS — Z9851 Tubal ligation status: Secondary | ICD-10-CM | POA: Diagnosis not present

## 2016-10-26 DIAGNOSIS — Z8261 Family history of arthritis: Secondary | ICD-10-CM | POA: Insufficient documentation

## 2016-10-26 MED ORDER — LORAZEPAM 0.5 MG PO TABS: ORAL_TABLET | ORAL | 0 refills | Status: DC

## 2016-10-26 NOTE — Progress Notes (Signed)
I was asked to see patient briefly while she is here seeing MD. Patient recently diagnosed with Tongue cancer and has a feeding tube. Patient concerned because she cannot afford TF. States she is using Osmolite 1.5, 1 bottle, QID, with 50 mL free water before and 100 mL after bolus feeding. She is tolerating some soft foods. She received TF from [facility]. Provided one sample case of Jevity 1.5 and instructed patient to use 2 bottles combined with 2 bottles Osmolite 1.5 daily. Will assess tolerance and see patient as needed. Encouraged her to call me with questions or concerns.

## 2016-10-26 NOTE — Progress Notes (Signed)
Radiation Oncology         [phone] ________________________________  Initial outpatient Consultation  Name: Kristen [name] MRN: [id number]  Date: 10/26/2016  DOB: [birth year]  WU:[id number], Ples Specter, FNP  Francina Ames, MD   REFERRING PHYSICIAN: Francina Ames, MD  DIAGNOSIS:    ICD-10-CM   1. Tongue lesion K14.8   2. 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 Malignant neoplasm of base of tongue ([place]) C01   4. 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 female who presented to ENT with headaches in April of 2018. MRI revealed left tongue / tongue base tumor.  Dr. Nicolette Bang performed surgery on 09/27/16. This included left hemiglossectomy, resection of base of tongue cancer, pharyngoplasty, left selective neck dissection, levels I and II, tracheotomy, and G tube placement. She has questionable margins that are technically negative.  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 14 lymph nodes from the left level II neck, 2 nodes from the left submandibular region, and 4 nodes from the submental region were negative for malignancy. Carcinoma was present focally at the lateral edge of the left posterior tongue, 4.6 cm in greatest extent. The lateral margin was sent separately as specimen B, which was negative for carcinoma. Perineural invasion was  appreciated but lymphovascular invasion was not identified.  The patient underwent a CT of her chest with contrast yesterday which was negative for metastases. She had some other incidental findings. I personally reviewed these images.   According to our pathologist in [place], her disease is felt to be T4a pathologically, however at [facility] this was judged to be T3.   Swallowing issues, if any: Her swallowing has been improving since surgery. She is able to chew foods on the right side of her mouth. She is mostly eating softer foods.  Weight Changes: She has lost 30 lbs since her surgery in September. She had not lost weight before her surgery.  PEG: She had a PEG placed on 09/27/16 during surgery. She is instilling 4 cartons daily. She is also instilling extra water daily via her PEG.  Patient presents with daughter and granddaughter today. She suspects she will have trach removed next Wednesday.  Tobacco history, if any: no  ETOH abuse, if any: one beer daily  On review of symptoms, patient denies numbness or tingling in face. Endorses tingling and soreness in tongue today. Denies numbness in tongue, SOB. Endorses minimal cough and sneezing today. Endorses diarrhea x1 year that she is medicated for. She states her gallbladder was removed several years ago and has since taken 2 immodium every 46 hours. She denies urinary symptoms and memory issues though she notes this runs in her family. She denies rashes. Endorses anxiety about current bout with cancer and depression/being more emotional with current health issues, She states her insurance is not covering feeding tube. She endorses claustrophobia but feels she could  handle radiation therapy with medication. Endorses pain in left shoulder, numbness along medial aspect of left forearm extending to pinky.  She endorses hx of scoliosis surgery. Denies diabetes, thyroid issues. Reports that headaches went away with surgery.  10+ point ROS  otherwise negative.  PREVIOUS RADIATION THERAPY: No  PAST MEDICAL HISTORY:  has a past medical history of Allergy; Anemia; Anxiety; Arthritis; Chicken pox; Depression; Diarrhea; GERD (gastroesophageal reflux disease); H/O hiatal hernia; Headache(784.0); History of kidney stones; Hypertension; Migraine; OSA (obstructive sleep apnea) (10/13/2015); Pneumonia; Restless legs; Scoliosis; and Shortness of breath.    PAST SURGICAL HISTORY: Past Surgical History:  Procedure Laterality Date  . BACK SURGERY  07/21/1978  . CHOLECYSTECTOMY N/A 08/15/2013   Procedure: LAPAROSCOPIC CHOLECYSTECTOMY WITH INTRAOPERATIVE CHOLANGIOGRAM;  Surgeon: Gwenyth Ober, MD;  Location: [facility];  Service: General;  Laterality: N/A;  . COLONOSCOPY N/A 01/09/2013   Procedure: COLONOSCOPY;  Surgeon: Beryle Beams, MD;  Location: [facility];  Service: Endoscopy;  Laterality: N/A;  . DIRECT LARYNGOSCOPY  07/2016   Dr. Nicolette Bang [facility]  . GASTROSTOMY TUBE PLACEMENT  09/27/2016  . HERNIA REPAIR Left 1981  . MODIFIED RADICAL NECK DISSECTION Left 09/27/2016   Levels 1 & 2  . PARTIAL GLOSSECTOMY Left 09/27/2016   Left hemi partial glossectomy  . TONSILLECTOMY    . tracheotomy  09/27/2016  . TUBAL LIGATION  06/1988    FAMILY HISTORY: family history includes Arthritis in her mother; Diabetes in her maternal grandmother, maternal uncle, and paternal grandmother; Heart attack in her father; Heart disease in her father; Hypertension in her mother.  SOCIAL HISTORY:  reports that she has never smoked. She has never used smokeless tobacco. She reports that she drinks about 4.2 oz of alcohol per week . She reports that she does not use drugs.  ALLERGIES: Other and Sulfur  MEDICATIONS:  Current Outpatient Prescriptions  Medication Sig Dispense Refill  . amLODipine (NORVASC) 10 MG tablet TAKE ONE TABLET BY MOUTH ONCE A DAY 90 tablet 0  . baclofen (LIORESAL) 10 MG tablet TAKE ONE TABLET BY MOUTH TWICE DAILY TO THREE TIMES DAILY AS NEEDED  FOR  SPASM 90 tablet 4  . diclofenac (VOLTAREN) 75 MG EC tablet TAKE ONE TABLET BY MOUTH TWICE DAILY WITH FOOD AS NEEDED FOR PAIN 180 tablet 3  . Eluxadoline (VIBERZI) 100 MG TABS Take 1 tablet (100 mg total) by mouth 2 (two) times daily. 90 tablet 1  . gabapentin (NEURONTIN) 300 MG capsule Take 1 capsule (300 mg total) by mouth 3 (three) times daily. 270 capsule 0  . indomethacin (INDOCIN) 25 MG capsule Take 1 capsule by mouth 30-60 minutes prior to sexual activity. 30 capsule 0  . lisinopril (PRINIVIL,ZESTRIL) 40 MG tablet Take 1 tablet (40 mg total) by mouth daily. 90 tablet 1  . loperamide (IMODIUM) 2 MG capsule Take 2-4 mg by mouth 2 (two) times daily as needed for diarrhea or loose stools.     . pantoprazole (PROTONIX) 40 MG tablet Take 1 tablet (40 mg total) by mouth daily. 90 tablet 1  . SUMAtriptan (IMITREX) 100 MG tablet Take 0.5-1 tablet by mouth at the onset of migraine and may repeat in 2 hours if headache persists or recurs. 10 tablet 0  . tiZANidine (ZANAFLEX) 4 MG tablet TAKE 1 TABLET BY MOUTH EVERY 6 HOURS AS NEEDED 60 tablet 0  . traMADol (ULTRAM) 50 MG tablet Take 2 tablets (100 mg total) by mouth every 6 (six) hours as needed. 60 tablet 0  . chlorhexidine (  PERIDEX) 0.12 % solution     . Diclofenac Sodium 1.5 % SOLN     . HYDROcodone-acetaminophen (NORCO/VICODIN) 5-325 MG tablet     . LORazepam (ATIVAN) 0.5 MG tablet Take 1-2 tabs 30 minutes before wearing mask.  May dissolve under tongue. 70 tablet 0  . predniSONE (DELTASONE) 10 MG tablet Take 4 tablets po 01/27/2016, Take 3 and 1/2 tablets po 01/28/2016, Take 3 tablets po 1/11/2018m Take 2 and 1/2 tablets po 01/30/2016, Take 2 tablets po 01/31/2016, Take 1 and 1/2 tablets po 02/01/2016, Take 1 tablet po 02/02/2016, Then take 1/2 tablet po on 02/03/2016. (Patient not taking: Reported on 10/10/2016) 18 tablet 0  . predniSONE (DELTASONE) 5 MG tablet Take 8 tablets (40 mg total) by mouth daily with breakfast. (Patient not taking: Reported on  10/10/2016) 36 tablet 0  . traMADol (ULTRAM) 50 MG tablet Take 1 tablet (50 mg total) by mouth every 12 (twelve) hours as needed. 60 tablet 0  . traMADol (ULTRAM) 50 MG tablet Take 1 tablet (50 mg total) by mouth every 12 (twelve) hours as needed. (Patient not taking: Reported on 10/10/2016) 60 tablet 0   No current facility-administered medications for this encounter.     REVIEW OF SYSTEMS:  A 10+ POINT REVIEW OF SYSTEMS WAS OBTAINED including neurology, dermatology, psychiatry, cardiac, respiratory, lymph, extremities, GI, GU, Musculoskeletal, constitutional,  HEENT.  All pertinent positives are noted in the HPI.  All others are negative.   PHYSICAL EXAM:  height is 5' 9"  (1.753 m) and weight is 247 lb 6.4 oz (112.2 kg). Her temperature is 98.9 F (37.2 C). Her blood pressure is 131/98 (abnormal) and her pulse is 81. Her oxygen saturation is 98%.   General: Alert and oriented, in no acute distress, no dentation HEENT: Head is normocephalic. Extraocular movements are intact. Posterior right tongue elevated more than left. Very, very brisk gag reflex making exam limited Neck:  She has an anterior neck scar which is horizontal and healing. Lymphedema noted in submental region. Trach intact. Heart: Regular in rate and rhythm with no murmurs, rubs, or gallops. Chest: Clear to auscultation bilaterally, with no rhonchi, wheezes, or rales. Abdomen: Soft, nontender, nondistended, with no rigidity or guarding. Feeding tube intact in left upper abdomen. Extremities: No cyanosis or edema. Lymphatics: see Neck Exam Skin: No concerning lesions. Musculoskeletal: symmetric strength and muscle tone throughout. Neurologic: Cranial nerves II through XII are grossly intact. No obvious focalities. Speech is fluent. Coordination is intact. Psychiatric: Judgment and insight are intact. Affect is appropriate.   ECOG = 1  0 - Asymptomatic (Fully active, able to carry on all predisease activities without  restriction)  1 - Symptomatic but completely ambulatory (Restricted in physically strenuous activity but ambulatory and able to carry out work of a light or sedentary nature. For example, light housework, office work)  2 - Symptomatic, <50% in bed during the day (Ambulatory and capable of all self care but unable to carry out any work activities. Up and about more than 50% of waking hours)  3 - Symptomatic, >50% in bed, but not bedbound (Capable of only limited self-care, confined to bed or chair 50% or more of waking hours)  4 - Bedbound (Completely disabled. Cannot carry on any self-care. Totally confined to bed or chair)  5 - Death   Eustace Pen MM, Creech RH, Tormey DC, et al. [phone]). "Toxicity and response criteria of the [facility]". [place] Oncol. 5 (6): 649-55   LABORATORY DATA:  Lab Results  Component Value Date   WBC 10.4 10/06/2016   HGB 10.9 (L) 10/06/2016   HCT 34.7 (L) 10/06/2016   MCV 88.3 10/06/2016   PLT 384 10/06/2016   CMP     Component Value Date/Time   NA 139 10/06/2016 2214   K 4.5 10/06/2016 2214   CL 102 10/06/2016 2214   CO2 28 10/06/2016 2214   GLUCOSE 104 (H) 10/06/2016 2214   BUN 29 (H) 10/06/2016 2214   CREATININE 0.97 10/06/2016 2214   CALCIUM 9.1 10/06/2016 2214   PROT 7.6 10/06/2016 2214   ALBUMIN 3.6 10/06/2016 2214   AST 24 10/06/2016 2214   ALT 49 10/06/2016 2214   ALKPHOS 135 (H) 10/06/2016 2214   BILITOT 0.8 10/06/2016 2214   GFRNONAA >60 10/06/2016 2214   GFRAA >60 10/06/2016 2214         RADIOGRAPHY: Dg Abdomen 1 View  Result Date: 10/11/2016 CLINICAL DATA:  G-tube replacement with contrast. EXAM: ABDOMEN - 1 VIEW COMPARISON:  10/07/2016 FINDINGS: Percutaneous gastrostomy tube balloon is seen within the lumen of the stomach. Enteric contrast is seen in the fundus of the stomach as well as antegrade within the duodenum sweep. No extravasation of contrast is noted. Levoscoliosis of the thoracolumbar spine with  lumbar spinal fusion and Harrington rod fixation is noted. IMPRESSION: Gastric tube noted within the lumen of the stomach. Electronically Signed   By: Ashley Royalty M.D.   On: 10/11/2016 02:42   Ct Chest W Contrast  Result Date: 10/26/2016 CLINICAL DATA:  Malignant neoplasm of base of tongue. Diagnosed in August. Surgery in September. Cough and 30 pound weight loss last month. EXAM: CT CHEST WITH CONTRAST TECHNIQUE: Multidetector CT imaging of the chest was performed during intravenous contrast administration. CONTRAST:  7m ISOVUE-300 IOPAMIDOL (ISOVUE-300) INJECTION 61% COMPARISON:  Plain film 10/07/2016.  Chest CT 01/23/2016. FINDINGS: Cardiovascular: Mild cardiomegaly, without pericardial effusion. No central pulmonary embolism, on this non-dedicated study. Pulmonary artery enlargement, outflow tract 3.3 cm. Mediastinum/Nodes: Small left supraclavicular and low jugular nodes are similar and not pathologic by size criteria. No mediastinal or hilar adenopathy. Lungs/Pleura: No pleural fluid. Interval tracheostomy. Clear lungs. Upper Abdomen: Suspicion of mild hepatic steatosis. Normal imaged portions of the spleen, stomach, pancreas, adrenal glands. Right renal cortical thinning. Left renal scarring. Hypoattenuation centrally in the upper pole left kidney on image 137/series 2 could represent caliectasis or central cyst. Possible heterogeneous upper pole right renal enhancement. Degraded by adjacent beam hardening artifact from spine hardware. Musculoskeletal: Thoracic spondylosis and spinal curvature. IMPRESSION: 1.  No acute process or evidence of metastatic disease in the chest. 2. Mild cardiomegaly. 3. Pulmonary artery enlargement suggests pulmonary arterial hypertension. 4. Probable mild hepatic steatosis. 5. Incompletely imaged but abnormal appearing kidneys bilaterally. Upper pole left-sided caliectasis versus a central cyst. Bilateral renal cortical thinning and scarring. Possible upper pole right renal  heterogeneous enhancement. Correlate with renal insufficiency and consider dedicated renal ultrasound versus contrast-enhanced CT. Electronically Signed   By: KAbigail MiyamotoM.D.   On: 10/26/2016 08:35   Dg Abd Acute W/chest  Result Date: 10/07/2016 CLINICAL DATA:  Vomiting EXAM: DG ABDOMEN ACUTE W/ 1V CHEST COMPARISON:  02/16/2016 FINDINGS: Single-view chest demonstrates interim placement of tracheostomy tube, the tip is about 3.4 cm superior to the carina. Minimal atelectasis at the left base. No consolidation or effusion. Stable borderline to mild enlargement of the cardiomediastinal silhouette. Supine and upright views of the abdomen demonstrate no free air beneath the diaphragm. Surgical clips in the  right upper quadrant. Nonobstructed bowel-gas pattern. Scoliosis of the thoracolumbar spine with long go right spinal fixation rod over the thoracolumbar stabilization rods and fixating screws at the lower lumbar spine. Discontinuity between the long right surgical rod and remaining rod and screws at the lower lumbar spine is re- demonstrated. IMPRESSION: 1. Low lung volumes. 2. Nonobstructed bowel-gas pattern Electronically Signed   By: Donavan Foil M.D.   On: 10/07/2016 03:47      IMPRESSION/PLAN:  This is a delightful patient with head and neck cancer. I  Recommend adjuvant radiotherapy for this patient. I anticipate 6 to 6.5 weeks of daily treatment to the tongue and throat.  We discussed the potential risks, benefits, and side effects of radiotherapy. We talked in detail about acute and late effects. We discussed that some of the most bothersome acute effects may be mucositis, dysgeusia, salivary changes, skin irritation, hair loss, dehydration, weight loss and fatigue. We talked about late effects which include but are not necessarily limited to dysphagia, hypothyroidism, nerve injury, spinal cord injury, xerostomia, trismus, and neck edema. No guarantees of treatment were given. A consent form was  signed and placed in the patient's medical record. The patient is enthusiastic about proceeding with treatment. I look forward to participating in the patient's care.    Planning session Friday of next week after trach is removed. CT sim without contrast. Start radiation near end of month to give time to heal from surgery. She states she has a gap in work from 9:30am to 1:30 pm which would be a good time for treatment.  We also discussed that the treatment of head and neck cancer is a multidisciplinary process to maximize treatment outcomes and quality of life. For this reason the following referrals have been or will be made:   Nutritionist for nutrition support during and after treatment.   Speech language pathology for swallowing and/or speech therapy.   Social work for social support. Will discuss financial burden of feeding tube without insurance.   Physical therapy due to risk of lymphedema in neck and deconditioning.   Baseline labs including TSH.  Referred to Mauricio Po NP for management of incidental findings on chest CT in a few months when she is done with treatment.   Ativan PRN claustrophobia during RT __________________________________________   Eppie Gibson, MD  This document serves as a record of services personally performed by Eppie Gibson, MD. It was created on his behalf by Linward Natal, a trained medical scribe. The creation of this record is based on the scribe's personal observations and the provider's statements to them. This document has been checked and approved by the attending provider.

## 2016-10-26 NOTE — Progress Notes (Signed)
Oncology Nurse Navigator Documentation  Met with Kristen [name] during initial consult with Dr. Isidore Moos.  She was accompanied by her dtr.    1. Further introduced myself as her Navigator, explained my role as a member of the Care Team.   2. Provided New Patient Information packet, discussed contents:  Contact information for physician(s), myself, other members of the Care Team.  Advance Directive information ([place] blue pamphlet with LCSW contact info)  Fall Prevention Patient Safety Plan  Appointment [place] sheet  [facility] campus map with highlight of [place] 3. Provided introductory explanation of radiation treatment including SIM planning and purpose of Aquaplast head and shoulder mask, showed them example.  She stated she is claustrophobic, I noted there is an open-faced mask option. 4. Of Note:  Had trach and PEG placement at [facility]; she is expecting trach removal 10/17.  Edentulous.  Seen by Johnson to address financial concerns.  Seen by Ronnie Derby who provided case of Osmolite 1.5 d/t limited finances. 5. I encouraged them to contact me with questions/concerns as treatments/procedures begin.  They verbalized understanding of information provided.    Kristen Orem, RN, BSN, [place] [facility]ck Oncology Nurse [place] at [place] [phone]

## 2016-10-26 NOTE — Progress Notes (Signed)
Financial Counseling--spoke with patient and daughter today about applying for the [facility] grant--she is going to bring in her income verification next week to see if she qualifies

## 2016-10-29 ENCOUNTER — Other Ambulatory Visit: Payer: Self-pay | Admitting: Radiation Oncology

## 2016-10-29 DIAGNOSIS — Z1329 Encounter for screening for other suspected endocrine disorder: Secondary | ICD-10-CM

## 2016-10-29 DIAGNOSIS — C01 Malignant neoplasm of base of tongue: Secondary | ICD-10-CM | POA: Insufficient documentation

## 2016-10-29 DIAGNOSIS — C0680 Malignant neoplasm of overlapping sites of unspecified parts of mouth: Secondary | ICD-10-CM | POA: Insufficient documentation

## 2016-10-29 DIAGNOSIS — C029 Malignant neoplasm of tongue, unspecified: Secondary | ICD-10-CM

## 2016-10-29 DIAGNOSIS — C760 Malignant neoplasm of head, face and neck: Secondary | ICD-10-CM | POA: Insufficient documentation

## 2016-11-01 MED FILL — HYDROCODON-APAP 5-325: 5-325 | 7 days supply | Qty: 50 | Fill #0

## 2016-11-01 MED FILL — traMADol HCL 50 MG TABS: 50 | 13 days supply | Qty: 50 | Fill #0

## 2016-11-01 MED FILL — LORazepam 0.5 MG TABS: 0.5 | 35 days supply | Qty: 70 | Fill #0

## 2016-11-05 ENCOUNTER — Other Ambulatory Visit: Payer: Self-pay | Admitting: Radiation Oncology

## 2016-11-05 ENCOUNTER — Ambulatory Visit
Admission: RE | Admit: 2016-11-05 | Discharge: 2016-11-05 | Disposition: A | Discharge status: Home / Self Care | Payer: BC Managed Care – PPO | Source: Ambulatory Visit | Attending: Radiation Oncology | Admitting: Radiation Oncology

## 2016-11-05 DIAGNOSIS — Z1329 Encounter for screening for other suspected endocrine disorder: Secondary | ICD-10-CM

## 2016-11-05 DIAGNOSIS — C0680 Malignant neoplasm of overlapping sites of unspecified parts of mouth: Secondary | ICD-10-CM

## 2016-11-05 DIAGNOSIS — C029 Malignant neoplasm of tongue, unspecified: Secondary | ICD-10-CM

## 2016-11-05 DIAGNOSIS — C760 Malignant neoplasm of head, face and neck: Secondary | ICD-10-CM

## 2016-11-05 DIAGNOSIS — Z510 Encounter for antineoplastic radiation therapy: Secondary | ICD-10-CM | POA: Diagnosis not present

## 2016-11-05 LAB — TSH: TSH: 1.121 m(IU)/L (ref 0.308–3.960)

## 2016-11-05 NOTE — Progress Notes (Signed)
Head and Neck Cancer Simulation, IMRT treatment planning note   Outpatient  Diagnosis:    ICD-10-CM   1. 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 Carcinoma of contiguous sites of mouth [facility]) C06.80     The patient was taken to the CT simulator and laid in the supine position on the table. An Aquaplast head and shoulder mask was custom fitted to the patient's anatomy. High-resolution CT axial imaging was obtained of the head and neck with contrast. I verified that the quality of the imaging is good for treatment planning. 1 Medically Necessary Treatment Device was fabricated and supervised by me: Aquaplast mask.   Treatment planning note I plan to treat the patient with IMRT. I plan to treat the patient's tumor bed of the tongue and surrounding tissues. I plan to treat to a total dose of 66 Gray in 33  fractions. Dose calculation was ordered from dosimetry.  IMRT planning Note  IMRT is medically necessary and an important modality to deliver adequate dose to the patient's at risk tissues while sparing the patient's normal structures, including the: esophagus, parotid tissue, mandible, brain stem, spinal cord, oral cavity, brachial plexus.  This justifies the use of IMRT in the patient's treatment.   -----------------------------------  Eppie Gibson, MD

## 2016-11-12 DIAGNOSIS — Z510 Encounter for antineoplastic radiation therapy: Secondary | ICD-10-CM | POA: Diagnosis not present

## 2016-11-16 ENCOUNTER — Ambulatory Visit
Admission: RE | Admit: 2016-11-16 | Discharge: 2016-11-16 | Disposition: A | Discharge status: Home / Self Care | Payer: BC Managed Care – PPO | Source: Ambulatory Visit | Attending: Radiation Oncology | Admitting: Radiation Oncology

## 2016-11-16 ENCOUNTER — Encounter: Payer: Self-pay | Admitting: Family

## 2016-11-16 ENCOUNTER — Ambulatory Visit: Payer: BC Managed Care – PPO | Admitting: Nutrition

## 2016-11-16 ENCOUNTER — Encounter: Payer: Self-pay | Admitting: *Deleted

## 2016-11-16 DIAGNOSIS — Z510 Encounter for antineoplastic radiation therapy: Secondary | ICD-10-CM | POA: Diagnosis not present

## 2016-11-16 NOTE — Progress Notes (Signed)
Oncology Nurse Navigator Documentation  To provide support, encouragement and care continuity, met with Ms. Marrazzo after first RT. She reported toleration of procedure w/o difficulty. I discussed her attendance at the 11/13 [facility] [place] with an 0800 arrival, provided her [place] Guidelines. She voiced agreement/understanding. I encouraged her to call with with questions/concerns.  Gayleen Orem, RN, BSN, [place] [facility] Oncology Nurse [place] at [place] [phone]

## 2016-11-16 NOTE — Progress Notes (Signed)
Patient is a 51 year old female diagnosed with tongue cancer.  She is a patient of Dr. Isidore Moos.  Past medical history includes pneumonia, migraine, hypertension, GERD, diarrhea, depression, anxiety, trach and G-tube placement.  Medications include Imodium, Ativan, Protonix, and prednisone.  Labs were reviewed.  Height: 5 feet 9 inches. Weight: 253.4 pounds. BMI: 37.42.  Patient had her trach removed. She reports she is eating better and has reduced tube feeding to 1-2 bottles a day. Patient has gained approximately 6 pounds over 3 weeks. Patient reports she can eat fish and french fries, sandwiches, chips and candy. She states she will occasionally use one bottle of Osmolite 1.5 if she has not had much to eat. Patient reports she developed severe diarrhea in 2014 was given medication. Patient reports bowel movements now are 1 times a week. "This is what her doctor wants." Patient cannot tell me who her doctor is.  Estimated nutrition needs:  2200-2400 calories, 110-125 grams protein, 2.4 L fluid.  Nutrition diagnosis:  Predicted suboptimal energy intake related to tongue cancer and associated treatments as evidenced by the history or presence of a condition for which research shows an increased incidence of suboptimal energy intake.  Intervention: Educated patient to continue small frequent meals and snacks that are high in protein throughout the day. Recommended patient discontinue Osmolite 1.5 at this time since patient is able to eat adequate calories and protein. Educated patient that we will resume tube feeding when oral intake becomes difficult. Stressed importance of weight maintenance. Questions were answered.  Teach back method used.  Contact information provided.  Monitoring, evaluation, goals: Patient will tolerate adequate calories and protein to promote weight stabilization and healing.  Next visit: Thursday, November 8 after radiation therapy.  **Disclaimer: This  note was dictated with voice recognition software. Similar sounding words can inadvertently be transcribed and this note may contain transcription errors which may not have been corrected upon publication of note.**

## 2016-11-17 ENCOUNTER — Ambulatory Visit
Admission: RE | Admit: 2016-11-17 | Discharge: 2016-11-17 | Disposition: A | Discharge status: Home / Self Care | Payer: BC Managed Care – PPO | Source: Ambulatory Visit | Attending: Radiation Oncology | Admitting: Radiation Oncology

## 2016-11-17 DIAGNOSIS — Z510 Encounter for antineoplastic radiation therapy: Secondary | ICD-10-CM | POA: Diagnosis not present

## 2016-11-18 ENCOUNTER — Ambulatory Visit
Admission: RE | Admit: 2016-11-18 | Discharge: 2016-11-18 | Disposition: A | Discharge status: Home / Self Care | Payer: BC Managed Care – PPO | Source: Ambulatory Visit | Attending: Radiation Oncology | Admitting: Radiation Oncology

## 2016-11-18 DIAGNOSIS — C760 Malignant neoplasm of head, face and neck: Secondary | ICD-10-CM

## 2016-11-18 DIAGNOSIS — Z510 Encounter for antineoplastic radiation therapy: Secondary | ICD-10-CM | POA: Diagnosis not present

## 2016-11-18 MED ORDER — SONAFINE EX EMUL
1.0000 "application " | Freq: Once | CUTANEOUS | Status: AC
  Administered 2016-11-18: 1 via TOPICAL

## 2016-11-18 NOTE — Progress Notes (Signed)
Pt here for patient teaching.  Pt given Radiation and You booklet, Managing Acute Radiation Side Effects for Head and Neck Cancer handout, skin care instructions, Alra deodorant and Sonafine.  Reviewed areas of pertinence such as fatigue, hair loss, mouth changes, skin changes, throat changes and taste changes . Pt able to give teach back of to pat skin, use unscented/gentle soap and drink plenty of water,apply Sonafine bid and avoid applying anything to skin within 4 hours of treatment. Pt verbalizes understanding of information given and will contact nursing with any questions or concerns.     Http://rtanswers.org/treatmentinformation/whattoexpect/index  Managing Acute Radiation Side Effects for Head and Neck Cancer  Skin irritation:  . Sonafine  Topical Emulsion: First-line topical cream to help soothe skin irritation.  Apply to skin in radiation fields at least 4 hours before radiotherapy, or any time after treatments during the rest of the day.  . Triple Antibiotic Ointment (Neosporin): Apply to areas of skin with moist breakdown to prevent infection.  . 1% hydrocortisone cream: Apply to areas of skin that are itching, up to three times a day.  Arnetha Massy (Silver Sulfadiazine): Used in select cases if large patches of skin develop moist breakdown (let physician or nurse know if you have a "sulfa" drug allergy)  Soreness in mouth or throat: . Baking Soda Rinse: a home remedy to soothe/cleanse mouth and loosen thick saliva.  Mix 1/2 teaspoon salt, 1/2 teaspoon baking soda, 1 pint water (16 oz or two cups).  Swish, gargle and spit as needed to soothe/cleanse mouth. Use as often as you want.  . Sucralfate (Carafate): coats throat to soothe it before meals or any time of day. Crush 1 tablet in 10 mL H20 and swallow up to four times a day.  . 2% viscous Lidocaine (Magic Mouth Wash): Soothes mouth and/or throat by numbing your mucous membranes. Mix 1 part 2% viscous lidocaine (Magic Mouth  Wash), 1 part H20. Swish and/or swallow 27m of this mixture, 320m before meals and at bedtime, up to four times a day. Alternate with Sucralfate (Carafate).  . Narcotics: Various short acting and long acting narcotics can be prescribed.  Often, medical oncology will prescribe these if you are receiving chemotherapy concurrently. Narcotics may cause constipation. It may be helpful to take a stool softener (Docusate Sodium) or gentle laxative (ie Senna or Polyethylene Glycol) to prevent constipation.  Having food in your stomach before ingesting a narcotic may reduce risk of stomach upset.  Thick Saliva: . Baking Soda Rinse: a home remedy to soothe/cleanse mouth and loosen thick saliva.  Mix 1/2 teaspoon salt, 1/2 teaspoon baking soda, 1 pint water (16 oz or two cups).  Swish, gargle, and spit as needed to soothe/cleanse mouth. Use as often as you want.  . Some patients find Diet Ginger Ale or Papaya Juice to be helpful.  . In extreme cases, your physician may consider prescribing a Scopolamine transdermal patch which dries up your saliva.     Poor taste, or lack of taste:   . There are no well-established medications to combat taste bud changes from radiotherapy.  It often takes weeks to months to regain taste function.  Eating bland foods and drinking nutritional shakes  may help you maintain your weight when food is not enjoyable.  Some patients supplement their oral intake with a feeding tube.  Fatigue and weakness: . There is not a well-established safe and effective medication to combat radiation-induced fatigue.  However, if you are able to perform light  exercise (such as a daily walk, yoga, recumbent stationary bicycling), this may combat fatigue and help you maintain muscle mass during treatment.  . Maintaining hydration and nutrition are also important.  If you have not been referred to a nutritionist and would like a referral, please let your nurse or physician know.  . Try to get at  least 8 hours of sleep each night. You may need a daily nap, but try not to nap so late that it interferes with your nightly sleep schedule.

## 2016-11-19 ENCOUNTER — Other Ambulatory Visit: Payer: Self-pay | Admitting: Radiation Oncology

## 2016-11-19 ENCOUNTER — Ambulatory Visit
Admission: RE | Admit: 2016-11-19 | Discharge: 2016-11-19 | Disposition: A | Discharge status: Home / Self Care | Payer: BC Managed Care – PPO | Source: Ambulatory Visit | Attending: Radiation Oncology | Admitting: Radiation Oncology

## 2016-11-19 DIAGNOSIS — C760 Malignant neoplasm of head, face and neck: Secondary | ICD-10-CM

## 2016-11-19 DIAGNOSIS — Z510 Encounter for antineoplastic radiation therapy: Secondary | ICD-10-CM | POA: Diagnosis not present

## 2016-11-19 MED ORDER — HYDROCODONE-ACETAMINOPHEN 5-325 MG PO TABS: 1.0000 | ORAL_TABLET | Freq: Four times a day (QID) | ORAL | 0 refills | Status: DC | PRN

## 2016-11-19 MED FILL — HYDROCODON-APAP 5-325: 5-325 | 8 days supply | Qty: 60 | Fill #0

## 2016-11-22 ENCOUNTER — Other Ambulatory Visit (INDEPENDENT_AMBULATORY_CARE_PROVIDER_SITE_OTHER): Payer: Self-pay | Admitting: Specialist

## 2016-11-22 ENCOUNTER — Other Ambulatory Visit: Payer: Self-pay | Admitting: Radiation Oncology

## 2016-11-22 ENCOUNTER — Ambulatory Visit
Admission: RE | Admit: 2016-11-22 | Discharge: 2016-11-22 | Disposition: A | Discharge status: Home / Self Care | Payer: BC Managed Care – PPO | Source: Ambulatory Visit | Attending: Radiation Oncology | Admitting: Radiation Oncology

## 2016-11-22 DIAGNOSIS — C01 Malignant neoplasm of base of tongue: Secondary | ICD-10-CM

## 2016-11-22 DIAGNOSIS — Z510 Encounter for antineoplastic radiation therapy: Secondary | ICD-10-CM | POA: Diagnosis not present

## 2016-11-22 MED ORDER — LIDOCAINE VISCOUS 2 % MT SOLN: OROMUCOSAL | 5 refills | Status: DC

## 2016-11-22 MED ORDER — TRAMADOL HCL 50 MG PO TABS: 50.0000 mg | ORAL_TABLET | Freq: Two times a day (BID) | ORAL | 0 refills | Status: DC | PRN

## 2016-11-22 MED ORDER — BACLOFEN 10 MG PO TABS: 10.0000 mg | ORAL_TABLET | Freq: Two times a day (BID) | ORAL | 4 refills | Status: AC

## 2016-11-22 MED ORDER — DICLOFENAC SODIUM 75 MG PO TBEC: DELAYED_RELEASE_TABLET | ORAL | 3 refills | Status: DC

## 2016-11-22 MED FILL — LIDOCAINE 2% VISCOUS SOLN: 2 | 5 days supply | Qty: 100 | Fill #0

## 2016-11-22 NOTE — Telephone Encounter (Signed)
Patient called asking for refills on Tramadol, Diclofenac, Lisinopril, and Baclofen. CB # [phone]

## 2016-11-22 NOTE — Telephone Encounter (Signed)
Called tramadol to [facility]

## 2016-11-22 NOTE — Addendum Note (Signed)
Addended by: Minda Ditto, Alyse Low N on: 11/22/2016 11:49 AM   Modules accepted: Orders

## 2016-11-23 ENCOUNTER — Ambulatory Visit
Admission: RE | Admit: 2016-11-23 | Discharge: 2016-11-23 | Disposition: A | Discharge status: Home / Self Care | Payer: BC Managed Care – PPO | Source: Ambulatory Visit | Attending: Radiation Oncology | Admitting: Radiation Oncology

## 2016-11-23 ENCOUNTER — Telehealth: Payer: Self-pay

## 2016-11-23 DIAGNOSIS — Z510 Encounter for antineoplastic radiation therapy: Secondary | ICD-10-CM | POA: Diagnosis not present

## 2016-11-23 NOTE — Telephone Encounter (Signed)
Incoming message. Only discernible words were "my [birth year] is [birth year]". When called number back there was no answer.

## 2016-11-24 ENCOUNTER — Ambulatory Visit
Admission: RE | Admit: 2016-11-24 | Discharge: 2016-11-24 | Disposition: A | Discharge status: Home / Self Care | Payer: BC Managed Care – PPO | Source: Ambulatory Visit | Attending: Radiation Oncology | Admitting: Radiation Oncology

## 2016-11-24 DIAGNOSIS — Z510 Encounter for antineoplastic radiation therapy: Secondary | ICD-10-CM | POA: Diagnosis not present

## 2016-11-25 ENCOUNTER — Ambulatory Visit: Payer: BC Managed Care – PPO | Admitting: Nutrition

## 2016-11-25 ENCOUNTER — Ambulatory Visit
Admission: RE | Admit: 2016-11-25 | Discharge: 2016-11-25 | Disposition: A | Discharge status: Home / Self Care | Payer: BC Managed Care – PPO | Source: Ambulatory Visit | Attending: Radiation Oncology | Admitting: Radiation Oncology

## 2016-11-25 DIAGNOSIS — Z510 Encounter for antineoplastic radiation therapy: Secondary | ICD-10-CM | POA: Diagnosis not present

## 2016-11-25 NOTE — Progress Notes (Signed)
Nutrition Follow-up:  ASSESSMENT:  Nutrition follow-up completed with patient prior to radiation treatment for tongue cancer.  Patient reports a good/increasing appetite and no difficulty swallowing food or liquid. Patient is eating 2 meals/day and snacks.  Patient is not utilizing feeding tube at this time due to good oral intake, RD discussed this will restart if intake or weight decreases. However, pt reports she consumes pills through tube as she is having some difficulty swallowing these. When asked what patient consumes she states "what am I not eating", reports she consumes fish, chicken, eggs, sausage, grits and hard candies.  Patient reports no nutritional impact symptoms at this time, except for dry mouth. Patient has tried some mouth wash options, states they made her gag but she is willing to attempt again. Patient also states she has increased thirst, consuming at least 4 bottles of water/day.  Patient reports she continues to have a BM 1 time/week, and that this is normal for her. Reporting she will go see a new primary care doctor (Dr. Grier Mitts) next week to discuss this. Patient reports flavors have been intensified, for example honey buns are becoming extra sweet so she is unable to finish and she has been salting her foods less.   Medications: Imodium, Protonix, prednisone, Ativan  Labs: reviewed  Anthropometrics:   Reported weight (from 11/05) of 252 lbs, fairly stable from 253 lb on 10/30  Estimated Energy Needs  Kcals: 2200-2400 kcal Protein: 110-125 grams protein Fluid: 2.4 L/d  NUTRITION DIAGNOSIS: Predicted suboptimal energy intake - Progressing  INTERVENTION:  - Encouraged continuing adequate calorie and protein consumption, and to utilize contact information if intake or weight decreases - Encouraged use of mouth wash recipes to alleviate some of the symptoms associated with treatment - Stressed importance of weight maintenance  MONITORING,  EVALUATION, GOAL: Patient will tolerate adequate calories and protein to promote weight stabilization and healing  NEXT VISIT: As needed  Parks Ranger, MS, RDN, LDN 11/25/2016 11:05 AM

## 2016-11-26 ENCOUNTER — Ambulatory Visit
Admission: RE | Admit: 2016-11-26 | Discharge: 2016-11-26 | Disposition: A | Discharge status: Home / Self Care | Payer: BC Managed Care – PPO | Source: Ambulatory Visit | Attending: Radiation Oncology | Admitting: Radiation Oncology

## 2016-11-26 DIAGNOSIS — Z510 Encounter for antineoplastic radiation therapy: Secondary | ICD-10-CM | POA: Diagnosis not present

## 2016-11-29 ENCOUNTER — Telehealth: Payer: Self-pay | Admitting: *Deleted

## 2016-11-29 ENCOUNTER — Ambulatory Visit
Admission: RE | Admit: 2016-11-29 | Discharge: 2016-11-29 | Disposition: A | Discharge status: Home / Self Care | Payer: BC Managed Care – PPO | Source: Ambulatory Visit | Attending: Radiation Oncology | Admitting: Radiation Oncology

## 2016-11-29 DIAGNOSIS — Z510 Encounter for antineoplastic radiation therapy: Secondary | ICD-10-CM | POA: Diagnosis not present

## 2016-11-29 NOTE — Telephone Encounter (Signed)
Oncology Nurse Navigator Documentation  Spoke with Ms. Olberding, provided her an adjusted arrival time of 0900 for tomorrow's H&N MDC.  She voiced understanding.  Gayleen Orem, RN, BSN, [place] [facility] Nurse [place] [facility] [place] [phone]

## 2016-11-30 ENCOUNTER — Encounter: Payer: Self-pay | Admitting: Radiation Oncology

## 2016-11-30 ENCOUNTER — Ambulatory Visit: Payer: BC Managed Care – PPO | Admitting: Physical Therapy

## 2016-11-30 ENCOUNTER — Encounter: Payer: Self-pay | Admitting: *Deleted

## 2016-11-30 ENCOUNTER — Ambulatory Visit
Admission: RE | Admit: 2016-11-30 | Discharge: 2016-11-30 | Disposition: A | Discharge status: Home / Self Care | Payer: BC Managed Care – PPO | Source: Ambulatory Visit | Attending: Radiation Oncology | Admitting: Radiation Oncology

## 2016-11-30 ENCOUNTER — Encounter: Payer: Self-pay | Admitting: Family Medicine

## 2016-11-30 ENCOUNTER — Ambulatory Visit: Payer: BC Managed Care – PPO | Attending: Radiation Oncology

## 2016-11-30 ENCOUNTER — Ambulatory Visit: Payer: BC Managed Care – PPO | Admitting: Nutrition

## 2016-11-30 ENCOUNTER — Ambulatory Visit: Payer: BC Managed Care – PPO | Admitting: Family Medicine

## 2016-11-30 VITALS — BP 154/109 | HR 73 | Temp 98.9°F | Resp 20 | Wt 250.4 lb

## 2016-11-30 VITALS — BP 140/100 | HR 71 | Temp 98.6°F | Ht 69.75 in | Wt 249.9 lb

## 2016-11-30 DIAGNOSIS — R293 Abnormal posture: Secondary | ICD-10-CM | POA: Insufficient documentation

## 2016-11-30 DIAGNOSIS — M25612 Stiffness of left shoulder, not elsewhere classified: Secondary | ICD-10-CM

## 2016-11-30 DIAGNOSIS — Z510 Encounter for antineoplastic radiation therapy: Secondary | ICD-10-CM | POA: Diagnosis not present

## 2016-11-30 DIAGNOSIS — K219 Gastro-esophageal reflux disease without esophagitis: Secondary | ICD-10-CM | POA: Diagnosis not present

## 2016-11-30 DIAGNOSIS — K449 Diaphragmatic hernia without obstruction or gangrene: Secondary | ICD-10-CM | POA: Diagnosis not present

## 2016-11-30 DIAGNOSIS — M6281 Muscle weakness (generalized): Secondary | ICD-10-CM | POA: Diagnosis present

## 2016-11-30 DIAGNOSIS — I10 Essential (primary) hypertension: Secondary | ICD-10-CM | POA: Diagnosis not present

## 2016-11-30 DIAGNOSIS — K580 Irritable bowel syndrome with diarrhea: Secondary | ICD-10-CM

## 2016-11-30 DIAGNOSIS — Z931 Gastrostomy status: Secondary | ICD-10-CM | POA: Diagnosis not present

## 2016-11-30 DIAGNOSIS — R1312 Dysphagia, oropharyngeal phase: Secondary | ICD-10-CM | POA: Insufficient documentation

## 2016-11-30 DIAGNOSIS — C01 Malignant neoplasm of base of tongue: Secondary | ICD-10-CM

## 2016-11-30 DIAGNOSIS — C760 Malignant neoplasm of head, face and neck: Secondary | ICD-10-CM | POA: Diagnosis not present

## 2016-11-30 MED ORDER — PANTOPRAZOLE SODIUM 40 MG PO TBEC: 40.0000 mg | DELAYED_RELEASE_TABLET | Freq: Every day | ORAL | 3 refills | Status: DC

## 2016-11-30 MED ORDER — LISINOPRIL 40 MG PO TABS: 40.0000 mg | ORAL_TABLET | Freq: Every day | ORAL | 3 refills | Status: DC

## 2016-11-30 MED ORDER — ELUXADOLINE 100 MG PO TABS: 100.0000 mg | ORAL_TABLET | Freq: Two times a day (BID) | ORAL | 3 refills | Status: DC

## 2016-11-30 MED ORDER — AMLODIPINE BESYLATE 10 MG PO TABS: 10.0000 mg | ORAL_TABLET | Freq: Every day | ORAL | 3 refills | Status: DC

## 2016-11-30 MED ORDER — POTASSIUM 99 MG PO TABS: ORAL_TABLET | ORAL | 0 refills | Status: DC

## 2016-11-30 NOTE — Patient Instructions (Signed)
SWALLOWING EXERCISES Do these 6 of the 7 days per week until 07-04-17, then 2 times per week afterwards  1. Effortful Swallows - Press your tongue against the roof of your mouth for 3 seconds, then squeeze          the muscles in your neck while you swallow your saliva or a sip of water - Repeat 20 times, 2-3 times a day, and use whenever you eat or drink  2. Masako Swallow - swallow with your tongue sticking out - Stick tongue out past your teeth and gently bite tongue with your teeth - Swallow, while holding your tongue with your teeth - Repeat 20 times, 2-3 times a day *use a wet spoon if your mouth gets dry*  3. Pitch Raise - optional - Repeat "he", once per second in as high of a pitch as you can - Repeat 20 times, 2-3 times a day  4. Shaker Exercise - head lift  -- optional - Lie flat on your back in your bed or on a couch without pillows - Raise your head and look at your feet - KEEP YOUR SHOULDERS DOWN - HOLD FOR 45-60 SECONDS, then lower your head back down - Repeat 3 times, 2-3 times a day  5. Mendelsohn Maneuver - "half swallow" exercise - Start to swallow, and keep your Adam's apple up by squeezing hard with the            muscles of the throat - Hold the squeeze for 5-7 seconds and then relax - Repeat 20 times, 2-3 times a day *use a wet spoon if your mouth gets dry*  6. Tongue Stretch/Teeth Clean - Move your tongue around the pocket between your gums and teeth, clockwise and then counter-clockwise - Repeat on the back side, clockwise and then counter-clockwise - Repeat 15-20 times, 2-3 times a day  7. Breath Hold - Say "HUH!" loudly, then hold your breath for 3 seconds at your voice box - Repeat 20 times, 2-3 times a day  8. Chin pushback - Open your mouth  - Place your fist UNDER your chin near your neck, and push back with your fist for 5 seconds - Repeat 10 times, 2-3 times a day

## 2016-11-30 NOTE — Patient Instructions (Addendum)
DASH Eating Plan DASH stands for "Dietary Approaches to Stop Hypertension." The DASH eating plan is a healthy eating plan that has been shown to reduce high blood pressure (hypertension). It may also reduce your risk for type 2 diabetes, heart disease, and stroke. The DASH eating plan may also help with weight loss. What are tips for following this plan? General guidelines  Avoid eating more than 2,300 mg (milligrams) of salt (sodium) a day. If you have hypertension, you may need to reduce your sodium intake to 1,500 mg a day.  Limit alcohol intake to no more than 1 drink a day for nonpregnant women and 2 drinks a day for men. One drink equals 12 oz of beer, 5 oz of wine, or 1 oz of hard liquor.  Work with your health care provider to maintain a healthy body weight or to lose weight. Ask what an ideal weight is for you.  Get at least 30 minutes of exercise that causes your heart to beat faster (aerobic exercise) most days of the week. Activities may include walking, swimming, or biking.  Work with your health care provider or diet and nutrition specialist (dietitian) to adjust your eating plan to your individual calorie needs. Reading food labels  Check food labels for the amount of sodium per serving. Choose foods with less than 5 percent of the Daily Value of sodium. Generally, foods with less than 300 mg of sodium per serving fit into this eating plan.  To find whole grains, look for the word "whole" as the first word in the ingredient list. Shopping  Buy products labeled as "low-sodium" or "no salt added."  Buy fresh foods. Avoid canned foods and premade or frozen meals. Cooking  Avoid adding salt when cooking. Use salt-free seasonings or herbs instead of table salt or sea salt. Check with your health care provider or pharmacist before using salt substitutes.  Do not fry foods. Cook foods using healthy methods such as baking, boiling, grilling, and broiling instead.  Cook with  heart-healthy oils, such as olive, canola, soybean, or sunflower oil. Meal planning   Eat a balanced diet that includes: ? 5 or more servings of fruits and vegetables each day. At each meal, try to fill half of your plate with fruits and vegetables. ? Up to 6-8 servings of whole grains each day. ? Less than 6 oz of lean meat, poultry, or fish each day. A 3-oz serving of meat is about the same size as a deck of cards. One egg equals 1 oz. ? 2 servings of low-fat dairy each day. ? A serving of nuts, seeds, or beans 5 times each week. ? Heart-healthy fats. Healthy fats called Omega-3 fatty acids are found in foods such as flaxseeds and coldwater fish, like sardines, salmon, and mackerel.  Limit how much you eat of the following: ? Canned or prepackaged foods. ? Food that is high in trans fat, such as fried foods. ? Food that is high in saturated fat, such as fatty meat. ? Sweets, desserts, sugary drinks, and other foods with added sugar. ? Full-fat dairy products.  Do not salt foods before eating.  Try to eat at least 2 vegetarian meals each week.  Eat more home-cooked food and less restaurant, buffet, and fast food.  When eating at a restaurant, ask that your food be prepared with less salt or no salt, if possible. What foods are recommended? The items listed may not be a complete list. Talk with your dietitian about what   dietary choices are best for you. Grains Whole-grain or whole-wheat bread. Whole-grain or whole-wheat pasta. Brown rice. Oatmeal. Quinoa. Bulgur. Whole-grain and low-sodium cereals. Pita bread. Low-fat, low-sodium crackers. Whole-wheat flour tortillas. Vegetables Fresh or frozen vegetables (raw, steamed, roasted, or grilled). Low-sodium or reduced-sodium tomato and vegetable juice. Low-sodium or reduced-sodium tomato sauce and tomato paste. Low-sodium or reduced-sodium canned vegetables. Fruits All fresh, dried, or frozen fruit. Canned fruit in natural juice (without  added sugar). Meat and other protein foods Skinless chicken or turkey. Ground chicken or turkey. Pork with fat trimmed off. Fish and seafood. Egg whites. Dried beans, peas, or lentils. Unsalted nuts, nut butters, and seeds. Unsalted canned beans. Lean cuts of beef with fat trimmed off. Low-sodium, lean deli meat. Dairy Low-fat (1%) or fat-free (skim) milk. Fat-free, low-fat, or reduced-fat cheeses. Nonfat, low-sodium ricotta or cottage cheese. Low-fat or nonfat yogurt. Low-fat, low-sodium cheese. Fats and oils Soft margarine without trans fats. Vegetable oil. Low-fat, reduced-fat, or light mayonnaise and salad dressings (reduced-sodium). Canola, safflower, olive, soybean, and sunflower oils. Avocado. Seasoning and other foods Herbs. Spices. Seasoning mixes without salt. Unsalted popcorn and pretzels. Fat-free sweets. What foods are not recommended? The items listed may not be a complete list. Talk with your dietitian about what dietary choices are best for you. Grains Baked goods made with fat, such as croissants, muffins, or some breads. Dry pasta or rice meal packs. Vegetables Creamed or fried vegetables. Vegetables in a cheese sauce. Regular canned vegetables (not low-sodium or reduced-sodium). Regular canned tomato sauce and paste (not low-sodium or reduced-sodium). Regular tomato and vegetable juice (not low-sodium or reduced-sodium). Pickles. Olives. Fruits Canned fruit in a light or heavy syrup. Fried fruit. Fruit in cream or butter sauce. Meat and other protein foods Fatty cuts of meat. Ribs. Fried meat. Bacon. Sausage. Bologna and other processed lunch meats. Salami. Fatback. Hotdogs. Bratwurst. Salted nuts and seeds. Canned beans with added salt. Canned or smoked fish. Whole eggs or egg yolks. Chicken or turkey with skin. Dairy Whole or 2% milk, cream, and half-and-half. Whole or full-fat cream cheese. Whole-fat or sweetened yogurt. Full-fat cheese. Nondairy creamers. Whipped toppings.  Processed cheese and cheese spreads. Fats and oils Butter. Stick margarine. Lard. Shortening. Ghee. Bacon fat. Tropical oils, such as coconut, palm kernel, or palm oil. Seasoning and other foods Salted popcorn and pretzels. Onion salt, garlic salt, seasoned salt, table salt, and sea salt. Worcestershire sauce. Tartar sauce. Barbecue sauce. Teriyaki sauce. Soy sauce, including reduced-sodium. Steak sauce. Canned and packaged gravies. Fish sauce. Oyster sauce. Cocktail sauce. Horseradish that you find on the shelf. Ketchup. Mustard. Meat flavorings and tenderizers. Bouillon cubes. Hot sauce and Tabasco sauce. Premade or packaged marinades. Premade or packaged taco seasonings. Relishes. Regular salad dressings. Where to find more information:  National Heart, Lung, and Blood Institute: www.nhlbi.nih.gov  American Heart Association: www.heart.org Summary  The DASH eating plan is a healthy eating plan that has been shown to reduce high blood pressure (hypertension). It may also reduce your risk for type 2 diabetes, heart disease, and stroke.  With the DASH eating plan, you should limit salt (sodium) intake to 2,300 mg a day. If you have hypertension, you may need to reduce your sodium intake to 1,500 mg a day.  When on the DASH eating plan, aim to eat more fresh fruits and vegetables, whole grains, lean proteins, low-fat dairy, and heart-healthy fats.  Work with your health care provider or diet and nutrition specialist (dietitian) to adjust your eating plan to your individual   calorie needs. This information is not intended to replace advice given to you by your health care provider. Make sure you discuss any questions you have with your health care provider. Document Released: 12/24/2010 Document Revised: 12/29/2015 Document Reviewed: 12/29/2015 [employer] Interactive Patient Education  2017 [employer]  

## 2016-11-30 NOTE — Progress Notes (Signed)
Patient presents to clinic today for follow-up/transfer care.  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  Pt is followed by Dr. Eppie Gibson- Rad Onc,  Dr. Francina Ames- Otolaryngology.  HTN: -pt has been out of Norvasc x 2 months -has been taking lisinopril 40 mg, but has noticed her bp slowly increasing -she checks bp at home.  Typically 120s-130s/98 -At [place] this am was 157/109 -Pt denies HA, Changes in vision, CP. -she states she didn't take any meds this am.  IBS-D: -started in 2014 or so -thought would improve after cholecystectomy, but didn't -was taking Vibrezi, but is out -States without the med will have a formed BM, then diarrhea -pt states certain foods like hamburger will cause an episode  H/o hiatal hernia with GERD: -was taking protonix, but has been out -endorses increased flatus and belching  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: -when dx'd was having HA s/p coitus.  Went to OB/gyn who ordered imaging of head.  Cancer was then noted. -pt has never smoked -she underwent surgery and is going through radiation currently -she has a G-tube in place. -she is able to eat some foods po, but has difficulty with pills. -she will try crushing up pills in applesauce this wk to see if she can take them without getting choked.  Allergies: Sulfa-hives, edema Tomatoes-hives, edema   Past Medical History:  Diagnosis Date  . Allergy   . Anemia    Iron deficiency  . Anxiety   . Arthritis    back, left shoulder  . Chicken pox   . Depression   . Diarrhea    takes Imodium daily  . GERD (gastroesophageal reflux disease)   . H/O hiatal hernia   . Headache(784.0)   . History of kidney stones    1996ish  . Hypertension   . Migraine    none for 5 years (as of 01/13/16)  . OSA (obstructive sleep apnea) 10/13/2015   unable to get cpap, plans to get one in 2018  .  Pneumonia   . Restless legs   . Scoliosis   . Shortness of breath    with exertion    Past Surgical History:  Procedure Laterality Date  . BACK SURGERY  07/21/1978  . DIRECT LARYNGOSCOPY  07/2016   Dr. Nicolette Bang [facility]  . GASTROSTOMY TUBE PLACEMENT  09/27/2016  . HERNIA REPAIR Left 1981  . MODIFIED RADICAL NECK DISSECTION Left 09/27/2016   Levels 1 & 2  . PARTIAL GLOSSECTOMY Left 09/27/2016   Left hemi partial glossectomy  . TONSILLECTOMY    . tracheotomy  09/27/2016  . TUBAL LIGATION  06/1988    Current Outpatient Medications on File Prior to Visit  Medication Sig Dispense Refill  . aspirin 81 MG chewable tablet 81 mg.    . baclofen (LIORESAL) 10 MG tablet Take 1 tablet (10 mg total) 2 (two) times daily by mouth. 180 tablet 4  . calcium carbonate (TUMS EX) 750 MG chewable tablet Chew 750 mg by mouth.    . chlorhexidine (PERIDEX) 0.12 % solution     . diclofenac (VOLTAREN) 75 MG EC tablet TAKE ONE TABLET BY MOUTH TWICE DAILY WITH FOOD AS NEEDED FOR PAIN 180 tablet 3  . Diclofenac Sodium 1.5 % SOLN     . gabapentin (NEURONTIN) 300 MG capsule Take 1 capsule (300 mg total) by mouth 3 (three) times daily. 270 capsule  0  . HYDROcodone-acetaminophen (NORCO/VICODIN) 5-325 MG tablet Take 1-2 tablets by mouth every 6 (six) hours as needed for moderate pain. 60 tablet 0  . indomethacin (INDOCIN) 25 MG capsule Take 1 capsule by mouth 30-60 minutes prior to sexual activity. 30 capsule 0  . lidocaine (XYLOCAINE) 2 % solution Mix 1 part 2%viscous lidocaine,1part H2O.Swish and/or swallow 77m of this mixture,328m before meals and at bedtime, up to QID 100 mL 5  . lidocaine (XYLOCAINE) 5 % ointment Apply topically.    . Marland Kitchenoperamide (IMODIUM) 2 MG capsule Take 2-4 mg by mouth 2 (two) times daily as needed for diarrhea or loose stools.     . Marland KitchenORazepam (ATIVAN) 0.5 MG tablet Take 1-2 tabs 30 minutes before wearing mask.  May dissolve under tongue. 70 tablet 0  . nystatin (MYCOSTATIN) 100000 UNIT/ML  suspension Take by mouth.    . Potassium 99 MG TABS 1 tablet by Gastrostomy Tube route daily.    . SUMAtriptan (IMITREX) 100 MG tablet Take 0.5-1 tablet by mouth at the onset of migraine and may repeat in 2 hours if headache persists or recurs. 10 tablet 0  . tiZANidine (ZANAFLEX) 4 MG tablet TAKE 1 TABLET BY MOUTH EVERY 6 HOURS AS NEEDED 60 tablet 0  . traMADol (ULTRAM) 50 MG tablet Take 2 tablets (100 mg total) by mouth every 6 (six) hours as needed. 60 tablet 0   No current facility-administered medications on file prior to visit.     Allergies  Allergen Reactions  . Other Hives and Swelling    tomato  . Sulfur Hives and Swelling    Family History  Problem Relation Age of Onset  . Heart disease Father   . Heart attack Father   . Hypertension Mother   . Arthritis Mother   . Diabetes Maternal Grandmother   . Diabetes Paternal Grandmother   . Diabetes Maternal Uncle     Social History   Socioeconomic History  . Marital status: Legally Separated    Spouse name: Not on file  . Number of children: 3  . Years of education: 1223. Highest education level: Not on file  Social Needs  . Financial resource strain: Not on file  . Food insecurity - worry: Not on file  . Food insecurity - inability: Not on file  . Transportation needs - medical: Not on file  . Transportation needs - non-medical: Not on file  Occupational History  . 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 Smoking status: Never Smoker  . Smokeless tobacco: Never Used  Substance and Sexual Activity  . Alcohol use: Yes    Alcohol/week: 4.2 oz    Types: 7 Cans of beer per week    Comment: 1 beer daily  . Drug use: No  . Sexual activity: Yes    Birth control/protection: Surgical  Other Topics Concern  . Not on file  Social History Narrative   Fun: Play with her grandkids, read    ROS General: Denies fever, chills, night sweats, changes in weight, changes in appetite HEENT: Denies headaches, ear pain,  changes in vision, rhinorrhea, sore throat CV: Denies CP, palpitations, SOB, orthopnea Pulm: Denies SOB, cough, wheezing GI: Denies abdominal pain, nausea, vomiting, constipation  +diarrhea GU: Denies dysuria, hematuria, frequency, vaginal discharge Msk: Denies muscle cramps, joint pains Neuro: Denies weakness, numbness, tingling Skin: Denies rashes, bruising Psych: Denies depression, anxiety, hallucinations  BP (!) 140/100 (BP Location: Left Arm, Patient Position: Sitting, Cuff Size: Large)   Pulse  71   Temp 98.6 F (37 C) (Oral)   Ht 5' 9.75" (1.772 m)   Wt 249 lb 14.4 oz (113.4 kg)   LMP 11/15/2013   BMI 36.11 kg/m   Physical Exam Gen. Pleasant, well developed, well-nourished, in NAD HEENT: edentulous, [place]/AT, PERRL, no scleral icterus, no nasal drainage, pharynx without erythema or exudate. Lungs: no use of accessory muscles, CTAB, no wheezes, rales or rhonchi Cardiovascular: RRR, No r/g/m, no peripheral edema Abdomen: BS present, soft, nontender,nondistended, no hepatosplenomegaly, g-tube in place. Musculoskeletal: No deformities, moves all four extremities, no cyanosis or clubbing, normal tone Neuro:  A&Ox3, CN II-XII intact, normal gait Skin:  Warm, dry, intact, no lesions,  Well healing surgical incision at base of neck Psych: normal affect, mood appropriate  Recent Results (from the past 2160 hour(s))  Lipase, blood     Status: None   Collection Time: 10/06/16 10:14 PM  Result Value Ref Range   Lipase 21 11 - 51 U/L  Comprehensive metabolic panel     Status: Abnormal   Collection Time: 10/06/16 10:14 PM  Result Value Ref Range   Sodium 139 135 - 145 mmol/L   Potassium 4.5 3.5 - 5.1 mmol/L   Chloride 102 101 - 111 mmol/L   CO2 28 22 - 32 mmol/L   Glucose, Bld 104 (H) 65 - 99 mg/dL   BUN 29 (H) 6 - 20 mg/dL   Creatinine, Ser 0.97 0.44 - 1.00 mg/dL   Calcium 9.1 8.9 - 10.3 mg/dL   Total Protein 7.6 6.5 - 8.1 g/dL   Albumin 3.6 3.5 - 5.0 g/dL   AST 24 15 - 41 U/L     ALT 49 14 - 54 U/L   Alkaline Phosphatase 135 (H) 38 - 126 U/L   Total Bilirubin 0.8 0.3 - 1.2 mg/dL   GFR calc non Af Amer >60 >60 mL/min   GFR calc Af Amer >60 >60 mL/min    Comment: (NOTE) The eGFR has been calculated using the CKD EPI equation. This calculation has not been validated in all clinical situations. eGFR's persistently <60 mL/min signify possible Chronic Kidney Disease.    Anion gap 9 5 - 15  CBC     Status: Abnormal   Collection Time: 10/06/16 10:14 PM  Result Value Ref Range   WBC 10.4 4.0 - 10.5 K/uL   RBC 3.93 3.87 - 5.11 MIL/uL   Hemoglobin 10.9 (L) 12.0 - 15.0 g/dL   HCT 34.7 (L) 36.0 - 46.0 %   MCV 88.3 78.0 - 100.0 fL   MCH 27.7 26.0 - 34.0 pg   MCHC 31.4 30.0 - 36.0 g/dL   RDW 14.9 11.5 - 15.5 %   Platelets 384 150 - 400 K/uL  Urinalysis, Routine w reflex microscopic     Status: Abnormal   Collection Time: 10/07/16  1:35 AM  Result Value Ref Range   Color, Urine YELLOW YELLOW   APPearance CLEAR CLEAR   Specific Gravity, Urine 1.013 1.005 - 1.030   pH 8.0 5.0 - 8.0   Glucose, UA NEGATIVE NEGATIVE mg/dL   Hgb urine dipstick NEGATIVE NEGATIVE   Bilirubin Urine NEGATIVE NEGATIVE   Ketones, ur NEGATIVE NEGATIVE mg/dL   Protein, ur NEGATIVE NEGATIVE mg/dL   Nitrite NEGATIVE NEGATIVE   Leukocytes, UA TRACE (A) NEGATIVE   RBC / HPF 0-5 0 - 5 RBC/hpf   WBC, UA 6-30 0 - 5 WBC/hpf   Bacteria, UA MANY (A) NONE SEEN   Squamous Epithelial / LPF 0-5 (  A) NONE SEEN   Mucus PRESENT   Troponin I     Status: None   Collection Time: 10/07/16  3:03 AM  Result Value Ref Range   Troponin I <0.03 <0.03 ng/mL  TSH     Status: None   Collection Time: 11/05/16  9:04 AM  Result Value Ref Range   TSH 1.121 0.308 - 3.960 m(IU)/L    Assessment/Plan: Essential hypertension  -Encouraged to continue checking bp at home -Encouraged to limit sodium intake. - Plan: amLODipine (NORVASC) 10 MG tablet, lisinopril (PRINIVIL,ZESTRIL) 40 MG tablet -f/u in 1-2 months for bp  recheck.  Irritable bowel syndrome with diarrhea -Avoid foods that are triggers - Plan: Eluxadoline (VIBERZI) 100 MG TABS  Hiatal hernia with GERD -avoid food triggers  - Plan: pantoprazole (PROTONIX) 40 MG tablet  Gastrointestinal tube present ([place])  -continue daily flushes -Pt encouraged to try crushing pills and placing in applesauce to see if she can swallow them.  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

## 2016-11-30 NOTE — Therapy (Signed)
[facility] [address] [place], [place], [address] Phone: [phone]   Fax:  [phone]  Speech Language Pathology Evaluation  Patient Details  Name: Kristen [name] MRN: [id number] Date of Birth: 25-Jun-1965 Referring Provider: Eppie Gibson, MD   Encounter Date: 11/30/2016  End of Session - 11/30/16 1231    Visit Number  1    Number of Visits  7    Date for SLP Re-Evaluation  06/10/17    SLP Start Time  1030    SLP Stop Time   1115    SLP Time Calculation (min)  45 min    Activity Tolerance  Patient tolerated treatment well       Past Medical History:  Diagnosis Date  . Allergy   . Anemia    Iron deficiency  . Anxiety   . Arthritis    back, left shoulder  . Chicken pox   . Depression   . Diarrhea    takes Imodium daily  . GERD (gastroesophageal reflux disease)   . H/O hiatal hernia   . Headache(784.0)   . History of kidney stones    1996ish  . Hypertension   . Migraine    none for 5 years (as of 01/13/16)  . OSA (obstructive sleep apnea) 10/13/2015   unable to get cpap, plans to get one in 2018  . Pneumonia   . Restless legs   . Scoliosis   . Shortness of breath    with exertion    Past Surgical History:  Procedure Laterality Date  . BACK SURGERY  07/21/1978  . DIRECT LARYNGOSCOPY  07/2016   Dr. Nicolette Bang [facility]  . GASTROSTOMY TUBE PLACEMENT  09/27/2016  . HERNIA REPAIR Left 1981  . MODIFIED RADICAL NECK DISSECTION Left 09/27/2016   Levels 1 & 2  . PARTIAL GLOSSECTOMY Left 09/27/2016   Left hemi partial glossectomy  . TONSILLECTOMY    . tracheotomy  09/27/2016  . TUBAL LIGATION  06/1988    There were no vitals filed for this visit.  Subjective Assessment - 11/30/16 1028    Subjective  Pt reports pill dysphagia, needs to take small bites/sips. Eating mostly what reported as dys II-III food, thin liquids. Has PEG.    Currently in Pain?  No/denies         SLP Evaluation [facility] -  11/30/16 1028      SLP Visit Information   SLP Received On  11/30/16    Referring Provider  Eppie Gibson, MD    Onset Date  August 2018    Medical Diagnosis  Rt tonsillar SCC      General Information   HPI  Pt with resection of tongue base and hemiglossectomy 09-27-16 at [facility]. Reports pill dysphagia. Now adjuvant rad to surgical bed, started 11-16-16.      Prior Functional Status   Cognitive/Linguistic Baseline  Within functional limits      Cognition   Overall Cognitive Status  Within Functional Limits for tasks assessed      Auditory Comprehension   Overall Auditory Comprehension  Appears within functional limits for tasks assessed      Verbal Expression   Overall Verbal Expression  Appears within functional limits for tasks assessed      Oral Motor/Sensory Function   Overall Oral Motor/Sensory Function  Impaired    Labial ROM  Within Functional Limits    Labial Strength  Reduced Left pt reports numbness on lt side    Labial Sensation  Reduced Left    Lingual ROM  Reduced right;Reduced left    Lingual Strength  Reduced    Lingual Coordination  Reduced    Facial Sensation  Reduced lt cheek    Velum  Within Functional Limits      Motor Speech   Overall Motor Speech  Impaired    Articulation  Impaired misarticulations of velar consonants    Level of Impairment  Phrase    Intelligibility  Intelligible        Prior Functional Status - 11/30/16 1243      Prior Functional Status   Cognitive/Linguistic Baseline  Baseline deficits    Baseline deficit details  difficulty with meds, needs to take small bites/sips and chew on rt due to reduced lingual ROM for bolus manipulation laterally    Type of Home  House    Available Help at Discharge  Family;Available 24 hours/day      General - 11/30/16 1247      General Information   Date of Onset  09/27/16    HPI  Pt with resection of tongue base and hemiglossectomy 09-27-16 at [facility]. Reports pill dysphagia. Now adjuvant  rad to surgical bed, started 11-16-16.    Type of Study  Bedside Swallow Evaluation    Diet Prior to this Study  Dysphagia 2 (chopped);Dysphagia 3 (soft);Dysphagia 1 (puree);Thin liquids;PEG tube    Temperature Spikes Noted  No    Respiratory Status  Other (comment) previous trach from surgery 09-27-16   previous trach from surgery 09-27-16        Thin Liquid - 11/30/16 1244      Thin Liquid   Thin Liquid  Within functional limits    Presentation  Cup         Solid - 11/30/16 1244      Solid   Solid  Impaired since surgery 09-27-16   since surgery 09-27-16   Presentation  Self Fed    Oral Phase Impairments  Reduced lingual movement/coordination    Pharyngeal Phase Impairments  Cough - Delayed    Other Comments  delay cough x1/5 bites; pt stated this occurs approx once/day; no overt s/s aspiration PNA today nor any reported (surgery 09-27-16)      Because data states the risk for dysphagia during and after radiation treatment is high due to undergoing radiation tx, SLP taught pt about the possibility of reduced/limited ability for PO intake during rad tx. SLP encouraged pt to continue swallowing POs as far into rad tx as possible, even ingesting POs and/or completing HEP shortly after administration of pain meds.  SLP suggested to pt she adhere to dys I-II foods and thin liquids during rad tx, due to simpler to orally manipulate and swallow. She agreed this would be easier for her too.  SLP educated pt re: changes to swallowing musculature after rad tx, and why adherence to dysphagia HEP provided today and PO consumption was necessary to inhibit muscular disuse atrophy and to reduce muscle fibrosis following rad tx. Pt demonstrated understanding of these things to SLP.    SLP then developed a HEP for pt and pt was instructed how to perform exercises involving lingual, vocal, and pharyngeal strengthening. SLP performed each exercise and pt return demonstrated each exercise. SLP ensured  pt performance was correct prior to moving on to next exercise. Pt was instructed to complete this program 2 times a day, 6-7 days/week until 6 months after his or her last rad tx, then x2 a week after  that.     SLP Education - 11/30/16 1229    Education provided  Yes    Education Details  HEP, late effects head/neck radiation on swallowing, swallow precautions (small sips/bites, chew on rt), suggest dys I-II foods for POs during rad tx    Person(s) Educated  Patient;Parent(s)    Methods  Explanation;Demonstration;Verbal cues    Comprehension  Verbalized understanding;Returned demonstration;Verbal cues required       SLP Short Term Goals - 11/30/16 1237      SLP SHORT TERM GOAL #1   Title  pt will complete HEP with rare min A over two sessions    Time  2    Period  -- visits    Status  New      SLP [place] #2   Title  pt will tell SLP why she is completing HEP     Time  2    Period  -- visits    Status  New      SLP SHORT TERM GOAL #3   Title  pt will tell why a food journal can be helpful in returning to pre-radiation diet    Time  2    Period  -- visits    Status  New      SLP SHORT TERM GOAL #4   Title  pt will provide SLP 3 overt s/s aspiration PNA with modified independence    Time  3    Period  -- vistis    Status  New       SLP Long Term Goals - 11/30/16 1239      SLP LONG TERM GOAL #1   Title  pt will complete HEP with modified independence over three sessions     Time  6    Period  -- visits    Status  New      SLP LONG TERM GOAL #2   Title  pt will tell SLP when HEP frequency can be reduced to x2-3/week    Time  6    Period  -- visits    Status  New      SLP LONG TERM GOAL #3   Title  pt will tell SLP 3 overt s/s of aspiration PNA with modified independence over three sessions    Time  6    Period  -- visits    Status  New       Plan - 11/30/16 1231    Clinical Impression Statement  Pt with oropharyngeal swallowing WFL for dys I-II  items and thin liquids, however has dificulty with more solid foods today such as [place] sandwich, likely primarily due to the surgical resection of lingual musculature. The probability of swallowing difficulty increases further with the initiation of radiation therapy. Pt will require close watch on overt s/s aspiration PNA. She already has PEG and has shared with SLP that although she has used it more in the past she has not used it for primary nutrition recently. She reports using it last week as a meal replacement. Pt will need to be followed by SLP for regular assessment of accurate HEP completion as well as for safety with POs both during and following treatment/s.    Speech Therapy Frequency  -- approx once every 4 weeks    Duration  -- 6 visits    Treatment/Interventions  Aspiration precaution training;Pharyngeal strengthening exercises;Diet toleration management by SLP;Trials of upgraded texture/liquids;Internal/external aids;Patient/family education;Compensatory strategies;SLP instruction and feedback;Cueing hierarchy;Environmental controls  any or all may be used    Potential to Achieve Goals  Good    Potential Considerations  Previous level of function    SLP Home Exercise Plan  provided today    Consulted and Agree with Plan of Care  Patient       Patient will benefit from skilled therapeutic intervention in order to improve the following deficits and impairments:   Dysphagia, oropharyngeal    Problem List Patient Active Problem List   Diagnosis Date Noted  . 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 ([place]) 10/29/2016  . 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[place]) 10/29/2016  . Headache associated with sexual activity 05/14/2016  . Tongue lesion 05/14/2016  . Loose stools 03/12/2016  . Hypocalcemia 01/22/2016  . Thrombocytopenia ([place]) 01/22/2016  . Chronic diarrhea 01/22/2016  . Chest pain   . Essential hypertension   . Orthostatic hypotension   . Spinal stenosis of lumbar  region 01/20/2016    Class: Chronic  . DDD (degenerative disc disease), lumbar 01/20/2016    Class: Chronic  . Spinal stenosis of lumbar region with neurogenic claudication 01/20/2016  . Low back pain 12/22/2015  . Pre-op examination 12/22/2015  . OSA (obstructive sleep apnea) 10/13/2015  . Muscle cramp 08/01/2015  . GERD (gastroesophageal reflux disease) 07/09/2015  . Excessive daytime sleepiness 06/18/2015  . Shingles 08/28/2013  . Postop check 08/28/2013  . Cholelithiasis 07/10/2013  . Colitis 01/08/2013  . Postoperative anemia 01/08/2013  . Morbid obesity with BMI of 40.0-44.9, adult ([place]) 01/08/2013    [facility] ,[place], [place]  11/30/2016, 12:49 PM  [facility] [address] [place], [place], [address] Phone: [phone]   Fax:  [phone]  Name: Kristen [name] MRN: [id number] Date of Birth: August 31, 1965

## 2016-11-30 NOTE — Progress Notes (Signed)
Nutrition follow-up completed with patient during head and neck clinic for tongue cancer. Weight is stable at 250.6 pounds. Patient denies nutrition impact symptoms and is eating well. Reports a good bowel movement yesterday. She reports she will begin using her tube for nutrition.  Once she stops being able to eat at mealtimes.  Estimated energy needs: 2200-2400 calories, 110-125 grams protein, 2.4 L fluid.  Nutrition diagnosis: Predicted suboptimal energy intake continues.  Intervention: Patient educated to continue strategies for adequate calories and protein intake. Enforced importance of weight maintenance. Agree patient will begin using one bottle of Osmolite 1.5 with 60 mL free water before and after if she is unable to eat at mealtimes. Enforced education on tube feeding. Questions were answered.  Teach back method used.  Contact information provided.  Monitoring, evaluation, goals: Patient will tolerate adequate calories and protein to minimize weight loss throughout treatment.  Next visit: Tuesday, November 20.  **Disclaimer: This note was dictated with voice recognition software. Similar sounding words can inadvertently be transcribed and this note may contain transcription errors which may not have been corrected upon publication of note.**

## 2016-11-30 NOTE — Progress Notes (Signed)
Patients blood pressure was elevated this moring. Patient states that she has not taken her blood pressures medication yet. Patient also stated that she has an appointment with her primary doctor today at 200 pm. Patient denies having any chest pain,headaches or numbness or tingling in her extremities.Blood pressure was 157/104 then 154/109

## 2016-11-30 NOTE — Progress Notes (Signed)
Financial Counselor--Spoke with patient today about applying for [place] grant--she will bring in her income tomorrow to see if she qualifies

## 2016-11-30 NOTE — Therapy (Signed)
[place], [place], [address] Phone: [phone]   Fax:  [phone]  Physical Therapy Evaluation  Patient Details  Name: Kristen [name] MRN: [id number] Date of Birth: [birth year] Referring Provider: Dr. Eppie Gibson   Encounter Date: 11/30/2016  PT End of Session - 11/30/16 1245    Visit Number  1    Number of Visits  1    PT Start Time  [id number]    PT Stop Time  1005    PT Time Calculation (min)  27 min    Activity Tolerance  Patient tolerated treatment well    Behavior During Therapy  [facility] for tasks assessed/performed       Past Medical History:  Diagnosis Date  . Allergy   . Anemia    Iron deficiency  . Anxiety   . Arthritis    back, left shoulder  . Chicken pox   . Depression   . Diarrhea    takes Imodium daily  . GERD (gastroesophageal reflux disease)   . H/O hiatal hernia   . Headache(784.0)   . History of kidney stones    1996ish  . Hypertension   . Migraine    none for 5 years (as of 01/13/16)  . OSA (obstructive sleep apnea) 10/13/2015   unable to get cpap, plans to get one in 2018  . Pneumonia   . Restless legs   . Scoliosis   . Shortness of breath    with exertion    Past Surgical History:  Procedure Laterality Date  . BACK SURGERY  07/21/1978  . DIRECT LARYNGOSCOPY  07/2016   Dr. Nicolette Bang [facility]  . GASTROSTOMY TUBE PLACEMENT  09/27/2016  . HERNIA REPAIR Left 1981  . MODIFIED RADICAL NECK DISSECTION Left 09/27/2016   Levels 1 & 2  . PARTIAL GLOSSECTOMY Left 09/27/2016   Left hemi partial glossectomy  . TONSILLECTOMY    . tracheotomy  09/27/2016  . TUBAL LIGATION  06/1988    There were no vitals filed for this visit.   Subjective Assessment - 11/30/16 1112    Subjective  Ever since my surgery I can't move this arm.  Some days are better than others--today is not a good day for it.    Patient is accompained by:  Family member her mother, who came in partway  through session    Pertinent History  Diagnosis is adenoid cystic carcinoma.  Had left hemiglossectomy and resection of base of tongue 09/27/16 at [facility]; had trach placed and recently removed.  Plan is for adjuvant RT to surgical bed, 33 fractions.    Patient Stated Goals  get info from all head & neck clinic providers    Currently in Pain?  Yes    Pain Score  5     Pain Location  Other (Comment) tongue    Pain Descriptors / Indicators  Other (Comment) sticking pins    Pain Type  Surgical pain    Aggravating Factors   talking a lot    Pain Relieving Factors  pain meds         [facility] PT Assessment - 11/30/16 1236      Assessment   Medical Diagnosis  adenoid cystic carcinoma    Referring Provider  Dr. Eppie Gibson    Onset Date/Surgical Date  09/27/16    Prior Therapy  none      Precautions   Precautions  Other (comment)    Precaution Comments  cancer precautions  Restrictions   Weight Bearing Restrictions  No      Balance Screen   Has the patient fallen in the past 6 months  No    Has the patient had a decrease in activity level because of a fear of falling?   No    Is the patient reluctant to leave their home because of a fear of falling?   No      Home Film/video editor residence    Living Arrangements  Spouse/significant other    Type of [place]  One level      Prior Function   Level of Independence  Independent    Vocation  Full time employment    Catering manager on special needs bus    Leisure  no regular exercise      Cognition   Overall Cognitive Status  Within Functional Limits for tasks assessed      Observation/Other Assessments   Observations  unremarkable    Skin Integrity  left neck scar along skin fold; scar from trach      Coordination   Gross Motor Movements are Fluid and Coordinated  Yes      Functional Tests   Functional tests  Sit to Stand      Sit to Stand   Comments  8  times in 30 seconds, below average for age reports some leg fatigue following      Posture/Postural Control   Posture/Postural Control  Postural limitations    Postural Limitations  Forward head      ROM / Strength   AROM / PROM / Strength  AROM      AROM   Overall AROM Comments  neck AROM WFL except 25% loss with right rotation; right shoulder WFL throughout; left shoulder motions limited to approx. 90 degrees flexion, 60 degrees abduction apparent spinal accessory nerve injury from surger      Ambulation/Gait   Ambulation/Gait  Yes    Ambulation/Gait Assistance  7: Independent        LYMPHEDEMA/ONCOLOGY QUESTIONNAIRE - 11/30/16 1242      Type   Cancer Type  adenoid cystic carcinoma      Surgeries   Other Surgery Date  09/27/16 hemiglossectomy, BOT resection, partial neck dissection      Treatment   Active Radiation Treatment  Yes    Date  11/16/16    Body Site  sugical bed      Lymphedema Assessments   Lymphedema Assessments  Head and Neck      Head and Neck   4 cm superior to sternal notch around neck  41.7 cm    6 cm superior to sternal notch around neck  40.8 cm    8 cm superior to sternal notch around neck  43.5 cm          Objective measurements completed on examination: See above findings.              PT Education - 11/30/16 1244    Education provided  Yes    Education Details  neck ROM, posture, breathing, walking or other exercise program, CURE article on staying active, "Why exercise?" flyer, lymphedema and PT info; [employer] at the [employer]) Educated  Patient;Parent(s)    Methods  Explanation;Handout    Comprehension  Verbalized understanding              Head and  Neck Clinic Goals - 11/30/16 1253      Patient will be able to verbalize understanding of a home exercise program for cervical range of motion, posture, and walking.    Status  Achieved      Patient will be able to verbalize understanding of proper  sitting and standing posture.    Status  Achieved      Patient will be able to verbalize understanding of lymphedema risk and availability of treatment for this condition.    Status  Achieved         Plan - 11/30/16 1246    Clinical Impression Statement  Pleasant woman with adenoid cystic carcinoma, s/p Lt. hemiglossectomy with BOT resection and partical neck dissection; started XRT 11/16/16 and will have 33 fractions.  Had trach that has been removed.  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    History and Personal Factors relevant to plan of care:  Lt. spinal accessory nerve injury, remote h/o back surgery    Clinical Presentation  Evolving    Clinical Presentation due to:  partway through XRT    Clinical Decision Making  Moderate    Rehab Potential  Good    PT Frequency  One time visit    PT Treatment/Interventions  Patient/family education    PT Next Visit Plan  None at this time; may need PT if shoulder issues don't resolve fully and if she develops lymphedema    PT Home Exercise Plan  neck ROM, walking    Consulted and Agree with Plan of Care  Patient       Patient will benefit from skilled therapeutic intervention in order to improve the following deficits and impairments:  Decreased range of motion, Decreased strength, Postural dysfunction  Visit Diagnosis: Stiffness of left shoulder, not elsewhere classified - Plan: PT plan of care cert/re-cert  Abnormal posture - Plan: PT plan of care cert/re-cert  Muscle weakness (generalized) - Plan: PT plan of care cert/re-cert     Problem List Patient Active Problem List   Diagnosis Date Noted  . 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[place]) 10/29/2016  . Headache associated with sexual activity 05/14/2016  .  Tongue lesion 05/14/2016  . Loose stools 03/12/2016  . Hypocalcemia 01/22/2016  . Thrombocytopenia ([place]) 01/22/2016  . Chronic diarrhea 01/22/2016  . Chest pain   . Essential hypertension   . Orthostatic hypotension   . Spinal stenosis of lumbar region 01/20/2016    Class: Chronic  . DDD (degenerative disc disease), lumbar 01/20/2016    Class: Chronic  . Spinal stenosis of lumbar region with neurogenic claudication 01/20/2016  . Low back pain 12/22/2015  . Pre-op examination 12/22/2015  . OSA (obstructive sleep apnea) 10/13/2015  . Muscle cramp 08/01/2015  . GERD (gastroesophageal reflux disease) 07/09/2015  . Excessive daytime sleepiness 06/18/2015  . Shingles 08/28/2013  . Postop check 08/28/2013  . Cholelithiasis 07/10/2013  . Colitis 01/08/2013  . Postoperative anemia 01/08/2013  . Morbid obesity with BMI of 40.0-44.9, adult ([place]) 01/08/2013    Zarielle Cea 11/30/2016, 12:55 PM  [place], [place], [address] Phone: [phone]   Fax:  [phone]  Name: Kristen [name] MRN: [id number] Date of Birth: 1965/08/23  Serafina Royals, PT 11/30/16 12:56 PM

## 2016-12-01 ENCOUNTER — Ambulatory Visit
Admission: RE | Admit: 2016-12-01 | Discharge: 2016-12-01 | Disposition: A | Discharge status: Home / Self Care | Payer: BC Managed Care – PPO | Source: Ambulatory Visit | Attending: Radiation Oncology | Admitting: Radiation Oncology

## 2016-12-01 DIAGNOSIS — Z510 Encounter for antineoplastic radiation therapy: Secondary | ICD-10-CM | POA: Diagnosis not present

## 2016-12-02 ENCOUNTER — Ambulatory Visit
Admission: RE | Admit: 2016-12-02 | Discharge: 2016-12-02 | Disposition: A | Discharge status: Home / Self Care | Payer: BC Managed Care – PPO | Source: Ambulatory Visit | Attending: Radiation Oncology | Admitting: Radiation Oncology

## 2016-12-02 ENCOUNTER — Encounter: Payer: BC Managed Care – PPO | Admitting: Nutrition

## 2016-12-02 DIAGNOSIS — Z510 Encounter for antineoplastic radiation therapy: Secondary | ICD-10-CM | POA: Diagnosis not present

## 2016-12-03 ENCOUNTER — Ambulatory Visit
Admission: RE | Admit: 2016-12-03 | Discharge: 2016-12-03 | Disposition: A | Discharge status: Home / Self Care | Payer: BC Managed Care – PPO | Source: Ambulatory Visit | Attending: Radiation Oncology | Admitting: Radiation Oncology

## 2016-12-03 DIAGNOSIS — Z510 Encounter for antineoplastic radiation therapy: Secondary | ICD-10-CM | POA: Diagnosis not present

## 2016-12-03 MED FILL — LIDOCAINE 2% VISCOUS SOLN: 2 | 5 days supply | Qty: 100 | Fill #1

## 2016-12-05 ENCOUNTER — Other Ambulatory Visit: Payer: Self-pay | Admitting: Radiation Oncology

## 2016-12-05 ENCOUNTER — Ambulatory Visit
Admission: RE | Admit: 2016-12-05 | Discharge: 2016-12-05 | Disposition: A | Discharge status: Home / Self Care | Payer: BC Managed Care – PPO | Source: Ambulatory Visit | Attending: Radiation Oncology | Admitting: Radiation Oncology

## 2016-12-05 DIAGNOSIS — C760 Malignant neoplasm of head, face and neck: Secondary | ICD-10-CM

## 2016-12-05 DIAGNOSIS — Z510 Encounter for antineoplastic radiation therapy: Secondary | ICD-10-CM | POA: Diagnosis not present

## 2016-12-05 MED ORDER — HYDROCODONE-ACETAMINOPHEN 5-325 MG PO TABS: 1.0000 | ORAL_TABLET | ORAL | 0 refills | Status: DC | PRN

## 2016-12-05 NOTE — Progress Notes (Signed)
Oncology Nurse Navigator Documentation  Met with Kristen [name] during H&N [place].  She was accompanied by her sister for later portion.  Provided verbal and written overview of [facility], the clinicians who will be seeing her, encouraged her to ask questions during her time with them.  She was seen by Nutrition, SLP, PT, SW and [place].  Provided her 6 syringes for PEG feedings.  Spoke with her at end of [facility], addressed questions. I encouraged her to call me with needs/concerns.  Gayleen Orem, RN, BSN, [place] at [place] [phone]

## 2016-12-06 ENCOUNTER — Ambulatory Visit
Admission: RE | Admit: 2016-12-06 | Discharge: 2016-12-06 | Disposition: A | Discharge status: Home / Self Care | Payer: BC Managed Care – PPO | Source: Ambulatory Visit | Attending: Radiation Oncology | Admitting: Radiation Oncology

## 2016-12-06 DIAGNOSIS — Z510 Encounter for antineoplastic radiation therapy: Secondary | ICD-10-CM | POA: Diagnosis not present

## 2016-12-06 MED FILL — HYDROCODON-APAP 5-325: 5-325 | 7 days supply | Qty: 80 | Fill #0

## 2016-12-07 ENCOUNTER — Ambulatory Visit: Payer: BC Managed Care – PPO | Admitting: Nutrition

## 2016-12-07 ENCOUNTER — Ambulatory Visit
Admission: RE | Admit: 2016-12-07 | Discharge: 2016-12-07 | Disposition: A | Discharge status: Home / Self Care | Payer: BC Managed Care – PPO | Source: Ambulatory Visit | Attending: Radiation Oncology | Admitting: Radiation Oncology

## 2016-12-07 DIAGNOSIS — Z510 Encounter for antineoplastic radiation therapy: Secondary | ICD-10-CM | POA: Diagnosis not present

## 2016-12-07 NOTE — Progress Notes (Signed)
Nutrition follow-up completed with patient after radiation therapy for tongue cancer. Weight documented as 248.6 pounds November 20, down from 250.38 pounds November 13. Patient reports she is altering textures of food so she can continue to eat more by mouth. She is using Osmolite 1.5 one bottle twice a day with 50 mL of free water before and after each bolus feeding via PEG. Patient denies other nutrition impact symptoms.  Estimated energy needs: 2200-2400 calories, 110-125 grams protein, 2.4 L fluid.  Nutrition diagnosis:  Predicted suboptimal energy intake has evolved into inadequate oral intake related to tongue cancer and associated treatments as evidenced by patient's self-report needing to use her feeding tube.  Intervention: Encouraged patient to continue strategies for increased oral intake of both soft foods and water. Provided oral nutrition supplement samples for patient to try to drink these by mouth. Educated patient on tube feeding advancement. When patient is no longer eating by mouth, patient will require 6 1/2 bottles of Osmolite 1.5 daily with 60 mL free water before and after bolus feedings 4 times a day. Patient will also continue to drink at least 750 mL free water daily. Provided patient with samples.  Patient does not have any way to pay for tube feeding through homecare. Questions were answered.  Teach back method used.  Monitoring, evaluation, goals: Patient will work to tolerate soft foods, fluids and tube feeding to minimize weight loss.  Next visit: Thursday, November 29.  **Disclaimer: This note was dictated with voice recognition software. Similar sounding words can inadvertently be transcribed and this note may contain transcription errors which may not have been corrected upon publication of note.**

## 2016-12-08 ENCOUNTER — Telehealth: Payer: Self-pay

## 2016-12-08 ENCOUNTER — Ambulatory Visit
Admission: RE | Admit: 2016-12-08 | Discharge: 2016-12-08 | Disposition: A | Discharge status: Home / Self Care | Payer: BC Managed Care – PPO | Source: Ambulatory Visit | Attending: Radiation Oncology | Admitting: Radiation Oncology

## 2016-12-08 DIAGNOSIS — Z510 Encounter for antineoplastic radiation therapy: Secondary | ICD-10-CM | POA: Diagnosis not present

## 2016-12-08 NOTE — Telephone Encounter (Signed)
Kristen [name] called and asked for Dr. Isidore Moos to write her a prescription for Viberzi, which she uses for IBS so that it could be covered under the grant she has received for her cancer related medicines. I informed her that Dr. Isidore Moos would not be able to do that for her since it is not a medicine related to her current treatment for her tongue cancer and radiation treatment. She voiced her understanding. She knows to call me with any further questions or concerns

## 2016-12-13 ENCOUNTER — Telehealth: Payer: Self-pay | Admitting: *Deleted

## 2016-12-13 ENCOUNTER — Ambulatory Visit (HOSPITAL_COMMUNITY)
Admission: RE | Admit: 2016-12-13 | Discharge: 2016-12-13 | Disposition: A | Discharge status: Home / Self Care | Payer: BC Managed Care – PPO | Source: Ambulatory Visit | Attending: Radiation Oncology | Admitting: Radiation Oncology

## 2016-12-13 ENCOUNTER — Encounter: Payer: Self-pay | Admitting: Radiology

## 2016-12-13 ENCOUNTER — Ambulatory Visit
Admission: RE | Admit: 2016-12-13 | Discharge: 2016-12-13 | Disposition: A | Discharge status: Home / Self Care | Payer: BC Managed Care – PPO | Source: Ambulatory Visit | Attending: Radiation Oncology | Admitting: Radiation Oncology

## 2016-12-13 ENCOUNTER — Other Ambulatory Visit: Payer: Self-pay | Admitting: Radiation Oncology

## 2016-12-13 ENCOUNTER — Other Ambulatory Visit: Payer: Self-pay | Admitting: *Deleted

## 2016-12-13 DIAGNOSIS — C760 Malignant neoplasm of head, face and neck: Secondary | ICD-10-CM

## 2016-12-13 DIAGNOSIS — Z510 Encounter for antineoplastic radiation therapy: Secondary | ICD-10-CM | POA: Diagnosis not present

## 2016-12-13 HISTORY — PX: IR PATIENT EVAL TECH 0-60 MINS: IMG5564

## 2016-12-13 NOTE — Procedures (Signed)
Patient came in today with complaint of hub broken off inside balloon retention g tube.  She also had issues with reflux out of  the tube when she attempts to administer medications and flushes.  I explained that the options for her would be 1) to exchange the tube but she would still have issues with the reflux back because the balloon retention tubes do not have a clamp like her original pull thru catheter or 2) remove the broken plug and place a lopez valve on the tube that would have an on/ off stopcock.  The patient elected to have the lopez valve since that is the biggest concern for her with the balloon retention catheters.  She was happy with the solution and will call us if she has any other concerns.

## 2016-12-13 NOTE — Telephone Encounter (Signed)
Oncology Nurse Navigator Documentation  Rec'd call from Ms. Hintz.  She reported tab on hub of PEG extension has torn off, noted extension is contiguous with tube so expects entire PEG will need to be replaced.  I spoke with Tiffany, [facility] IR, arranged for her to be seen today s/p 11:45 RT and PUT with Dr. Isidore Moos.  Informed patient and RN [place].  Gayleen Orem, RN, BSN, [place] Oncology Nurse [place] at [place] [phone]

## 2016-12-13 NOTE — Telephone Encounter (Signed)
A user error has taken place: encounter opened in error, closed for administrative reasons.

## 2016-12-14 ENCOUNTER — Ambulatory Visit
Admission: RE | Admit: 2016-12-14 | Discharge: 2016-12-14 | Disposition: A | Discharge status: Home / Self Care | Payer: BC Managed Care – PPO | Source: Ambulatory Visit | Attending: Radiation Oncology | Admitting: Radiation Oncology

## 2016-12-14 DIAGNOSIS — Z510 Encounter for antineoplastic radiation therapy: Secondary | ICD-10-CM | POA: Diagnosis not present

## 2016-12-15 ENCOUNTER — Ambulatory Visit
Admission: RE | Admit: 2016-12-15 | Discharge: 2016-12-15 | Disposition: A | Discharge status: Home / Self Care | Payer: BC Managed Care – PPO | Source: Ambulatory Visit | Attending: Radiation Oncology | Admitting: Radiation Oncology

## 2016-12-15 DIAGNOSIS — Z510 Encounter for antineoplastic radiation therapy: Secondary | ICD-10-CM | POA: Diagnosis not present

## 2016-12-16 ENCOUNTER — Ambulatory Visit
Admission: RE | Admit: 2016-12-16 | Discharge: 2016-12-16 | Disposition: A | Discharge status: Home / Self Care | Payer: BC Managed Care – PPO | Source: Ambulatory Visit | Attending: Radiation Oncology | Admitting: Radiation Oncology

## 2016-12-16 ENCOUNTER — Ambulatory Visit: Payer: BC Managed Care – PPO | Admitting: Nutrition

## 2016-12-16 DIAGNOSIS — Z510 Encounter for antineoplastic radiation therapy: Secondary | ICD-10-CM | POA: Diagnosis not present

## 2016-12-16 NOTE — Progress Notes (Signed)
Nutrition follow-up completed with patient after radiation therapy for tongue cancer. Weight decreased and documented as 236.8 pounds November 29 decreased from 248.6 pounds November 20. Patient reports she is eating better. She has not used her tube feeding for approximately 5 days. States she is hungry but food does not have any taste. Concerned because food is sticking to the top of her mouth.  Estimated energy needs: 2200-2400 calories, 110-125 grams protein, 2.4 L fluid.  Nutrition diagnosis: Inadequate oral intake continues.  Intervention: Educated patient to continue soft foods as tolerated. Encouraged patient to speak with M.D. Regarding her questions. Recommended patient restart tube feeding. Patient will start with one bottle Osmolite 1.5 extreme milliliters free water before and after bolus feedings 4 times daily. Patient will then increase tube feeding to one bottle and a half 4 times a day as tolerated. Recommended patient continue water by mouth, drinking at least 750 mL free water daily. Teach back method used.  Questions were answered.  Monitoring, evaluation, goals: Patient will tolerate tube feeding and oral intake to meet greater than 90% of estimated nutrition needs for weight maintenance.  Next visit: Tuesday December 4 after radiation therapy.  **Disclaimer: This note was dictated with voice recognition software. Similar sounding words can inadvertently be transcribed and this note may contain transcription errors which may not have been corrected upon publication of note.**

## 2016-12-17 ENCOUNTER — Ambulatory Visit
Admission: RE | Admit: 2016-12-17 | Discharge: 2016-12-17 | Disposition: A | Discharge status: Home / Self Care | Payer: BC Managed Care – PPO | Source: Ambulatory Visit | Attending: Radiation Oncology | Admitting: Radiation Oncology

## 2016-12-17 ENCOUNTER — Other Ambulatory Visit: Payer: Self-pay | Admitting: Radiation Oncology

## 2016-12-17 DIAGNOSIS — C760 Malignant neoplasm of head, face and neck: Secondary | ICD-10-CM

## 2016-12-17 DIAGNOSIS — Z510 Encounter for antineoplastic radiation therapy: Secondary | ICD-10-CM | POA: Diagnosis not present

## 2016-12-17 MED ORDER — HYDROCODONE-ACETAMINOPHEN 5-325 MG PO TABS: 1.0000 | ORAL_TABLET | ORAL | 0 refills | Status: DC | PRN

## 2016-12-17 MED FILL — VIBERZI 100 MG TABLET: 100 | 30 days supply | Qty: 60 | Fill #0 | Status: TO

## 2016-12-17 MED FILL — HYDROCODON-APAP 5-325: 5-325 | 7 days supply | Qty: 80 | Fill #0

## 2016-12-18 ENCOUNTER — Other Ambulatory Visit: Payer: Self-pay | Admitting: Family

## 2016-12-20 ENCOUNTER — Ambulatory Visit
Admission: RE | Admit: 2016-12-20 | Discharge: 2016-12-20 | Disposition: A | Discharge status: Home / Self Care | Payer: BC Managed Care – PPO | Source: Ambulatory Visit | Attending: Radiation Oncology | Admitting: Radiation Oncology

## 2016-12-20 DIAGNOSIS — Z510 Encounter for antineoplastic radiation therapy: Secondary | ICD-10-CM | POA: Diagnosis not present

## 2016-12-21 ENCOUNTER — Ambulatory Visit: Payer: BC Managed Care – PPO | Admitting: Nutrition

## 2016-12-21 ENCOUNTER — Ambulatory Visit
Admission: RE | Admit: 2016-12-21 | Discharge: 2016-12-21 | Disposition: A | Discharge status: Home / Self Care | Payer: BC Managed Care – PPO | Source: Ambulatory Visit | Attending: Radiation Oncology | Admitting: Radiation Oncology

## 2016-12-21 DIAGNOSIS — Z510 Encounter for antineoplastic radiation therapy: Secondary | ICD-10-CM | POA: Diagnosis not present

## 2016-12-21 NOTE — Progress Notes (Signed)
Nutrition follow-up completed with patient receiving radiation therapy for tongue cancer. Weight decreased and documented as 234.2 pounds today decreased from 236.8 pounds November 29. Patient states overall she is eating better. She has been tolerating one bottle of Osmolite 1.5 via PEG daily. She had an episode of severe nausea but did not vomit. Patient reports she feels very weak and fatigued today.  She attributes this to a large bowel movement she had yesterday.  Estimated energy needs: 2200-2400 calories, 110-125 grams protein, 2.4 L fluid.  Nutrition diagnosis: Inadequate oral intake continues.  Intervention: I recommended that patient increase tube feeding to one bottle 4 times a day with 60 mL of free water before and after bolus feedings. Recommended patient continue to eat and drink water as tolerated. Recommended patient contact physician as needed. Teach back method used.  Questions were answered.  Monitoring, evaluation, goals: Patient will tolerate oral intake plus tube feeding to meet greater than 90% of estimated nutrition needs for weight maintenance.  Next visit: Wednesday, December 12 after radiation treatment.  **Disclaimer: This note was dictated with voice recognition software. Similar sounding words can inadvertently be transcribed and this note may contain transcription errors which may not have been corrected upon publication of note.**

## 2016-12-22 ENCOUNTER — Ambulatory Visit
Admission: RE | Admit: 2016-12-22 | Discharge: 2016-12-22 | Disposition: A | Discharge status: Home / Self Care | Payer: BC Managed Care – PPO | Source: Ambulatory Visit | Attending: Radiation Oncology | Admitting: Radiation Oncology

## 2016-12-22 DIAGNOSIS — Z510 Encounter for antineoplastic radiation therapy: Secondary | ICD-10-CM | POA: Diagnosis not present

## 2016-12-23 ENCOUNTER — Ambulatory Visit
Admission: RE | Admit: 2016-12-23 | Discharge: 2016-12-23 | Disposition: A | Discharge status: Home / Self Care | Payer: BC Managed Care – PPO | Source: Ambulatory Visit | Attending: Radiation Oncology | Admitting: Radiation Oncology

## 2016-12-23 ENCOUNTER — Encounter: Payer: Self-pay | Admitting: Oncology

## 2016-12-23 DIAGNOSIS — C760 Malignant neoplasm of head, face and neck: Secondary | ICD-10-CM

## 2016-12-23 DIAGNOSIS — Z510 Encounter for antineoplastic radiation therapy: Secondary | ICD-10-CM | POA: Diagnosis not present

## 2016-12-23 LAB — CBC WITH DIFFERENTIAL/PLATELET
BASO%: 0 % (ref 0.0–2.0)
Basophils Absolute: 0 10*3/uL (ref 0.0–0.1)
EOS ABS: 0.2 10*3/uL (ref 0.0–0.5)
EOS%: 5.2 % (ref 0.0–7.0)
HCT: 38.6 % (ref 34.8–46.6)
HGB: 12.1 g/dL (ref 11.6–15.9)
LYMPH%: 13.5 % — ABNORMAL LOW (ref 14.0–49.7)
MCH: 27 pg (ref 25.1–34.0)
MCHC: 31.3 g/dL — ABNORMAL LOW (ref 31.5–36.0)
MCV: 86.2 fL (ref 79.5–101.0)
MONO#: 0.5 10*3/uL (ref 0.1–0.9)
MONO%: 13.2 % (ref 0.0–14.0)
NEUT%: 68.1 % (ref 38.4–76.8)
NEUTROS ABS: 2.6 10*3/uL (ref 1.5–6.5)
PLATELETS: 208 10*3/uL (ref 145–400)
RBC: 4.48 10*6/uL (ref 3.70–5.45)
RDW: 15.4 % — AB (ref 11.2–14.5)
WBC: 3.9 10*3/uL (ref 3.9–10.3)
lymph#: 0.5 10*3/uL — ABNORMAL LOW (ref 0.9–3.3)

## 2016-12-23 LAB — BASIC METABOLIC PANEL
Anion Gap: 13 mEq/L — ABNORMAL HIGH (ref 3–11)
BUN: 14 mg/dL (ref 7.0–26.0)
CHLORIDE: 109 meq/L (ref 98–109)
CO2: 23 mEq/L (ref 22–29)
Calcium: 9.1 mg/dL (ref 8.4–10.4)
Creatinine: 0.8 mg/dL (ref 0.6–1.1)
EGFR: >60 mL/min/{1.73_m2} (ref >60)
Glucose: 75 mg/dl (ref 70–140)
POTASSIUM: 3.5 meq/L (ref 3.5–5.1)
SODIUM: 145 meq/L (ref 136–145)

## 2016-12-24 ENCOUNTER — Encounter (INDEPENDENT_AMBULATORY_CARE_PROVIDER_SITE_OTHER): Payer: Self-pay | Admitting: Specialist

## 2016-12-24 ENCOUNTER — Ambulatory Visit
Admission: RE | Admit: 2016-12-24 | Discharge: 2016-12-24 | Disposition: A | Discharge status: Home / Self Care | Payer: BC Managed Care – PPO | Source: Ambulatory Visit | Attending: Radiation Oncology | Admitting: Radiation Oncology

## 2016-12-24 DIAGNOSIS — Z510 Encounter for antineoplastic radiation therapy: Secondary | ICD-10-CM | POA: Diagnosis not present

## 2016-12-24 DIAGNOSIS — C760 Malignant neoplasm of head, face and neck: Secondary | ICD-10-CM

## 2016-12-24 MED ORDER — SONAFINE EX EMUL
1.0000 "application " | Freq: Once | CUTANEOUS | Status: AC
  Administered 2016-12-24: 1 via TOPICAL

## 2016-12-24 NOTE — Telephone Encounter (Signed)
Patient is requesting refill on Ultram Please advise. Thanks.

## 2016-12-25 ENCOUNTER — Other Ambulatory Visit: Payer: Self-pay | Admitting: Family

## 2016-12-27 ENCOUNTER — Ambulatory Visit: Payer: BC Managed Care – PPO

## 2016-12-28 ENCOUNTER — Telehealth: Payer: Self-pay | Admitting: Family Medicine

## 2016-12-28 ENCOUNTER — Ambulatory Visit
Admission: RE | Admit: 2016-12-28 | Discharge: 2016-12-28 | Disposition: A | Discharge status: Home / Self Care | Payer: BC Managed Care – PPO | Source: Ambulatory Visit | Attending: Radiation Oncology | Admitting: Radiation Oncology

## 2016-12-28 ENCOUNTER — Other Ambulatory Visit: Payer: Self-pay | Admitting: Radiation Oncology

## 2016-12-28 ENCOUNTER — Telehealth: Payer: Self-pay

## 2016-12-28 DIAGNOSIS — C01 Malignant neoplasm of base of tongue: Secondary | ICD-10-CM

## 2016-12-28 DIAGNOSIS — Z510 Encounter for antineoplastic radiation therapy: Secondary | ICD-10-CM | POA: Diagnosis not present

## 2016-12-28 LAB — BASIC METABOLIC PANEL
ANION GAP: 17 meq/L — AB (ref 3–11)
BUN: 7.4 mg/dL (ref 7.0–26.0)
CALCIUM: 9.1 mg/dL (ref 8.4–10.4)
CO2: 20 mEq/L — ABNORMAL LOW (ref 22–29)
Chloride: 104 mEq/L (ref 98–109)
Creatinine: 0.7 mg/dL (ref 0.6–1.1)
Glucose: 48 mg/dl — ABNORMAL LOW (ref 70–140)
POTASSIUM: 3.5 meq/L (ref 3.5–5.1)
SODIUM: 141 meq/L (ref 136–145)

## 2016-12-28 NOTE — Telephone Encounter (Signed)
Dr. Volanda Napoleon - Please advise. Thanks!

## 2016-12-28 NOTE — Telephone Encounter (Signed)
Copied from [place] [phone]. Topic: Quick Communication - See Telephone Encounter >> Dec 28, 2016  3:23 PM Boyd Kerbs wrote: CRM for notification. See Telephone encounter for:  Patient had radiation today and they did labs, her gloucose is 48 and very low. Wants to know if have any advise and for doctor to be aware of this.  Please call patient on what to do.  [facility] - [phone]  12/28/16.

## 2016-12-28 NOTE — Telephone Encounter (Signed)
I received a call from lab informing me that Ms. Kristen [name]'s glucose level was 48 on a lab ordered by Dr. Isidore Moos today. I informed Dr. Isidore Moos and she requested I call Ms. Kristen [name] and have her eat or drink something high in carbohydrates and call her PCP Dr. Grier Mitts and inform her of the lab result. I called Ms. Kristen [name] and instructed her to eat or drink something high and carbohydrates and continue to eat or drink foods regularly to maintain higher blood sugars. She voiced her understanding. She knows to call 911 if she develops shaking, dizziness, or any feelings of lightheadness. I called and spoke to the answering service at [facility] where Dr. Grier Mitts works and they will send the message to the on call person who will call Ms. Kristen [name] to see how she is doing.

## 2016-12-29 ENCOUNTER — Encounter (INDEPENDENT_AMBULATORY_CARE_PROVIDER_SITE_OTHER): Payer: Self-pay | Admitting: Specialist

## 2016-12-29 ENCOUNTER — Ambulatory Visit
Admission: RE | Admit: 2016-12-29 | Discharge: 2016-12-29 | Disposition: A | Discharge status: Home / Self Care | Payer: BC Managed Care – PPO | Source: Ambulatory Visit | Attending: Radiation Oncology | Admitting: Radiation Oncology

## 2016-12-29 ENCOUNTER — Ambulatory Visit: Payer: BC Managed Care – PPO | Admitting: Nutrition

## 2016-12-29 DIAGNOSIS — Z510 Encounter for antineoplastic radiation therapy: Secondary | ICD-10-CM | POA: Diagnosis not present

## 2016-12-29 NOTE — Telephone Encounter (Signed)
Tell the pt to eat something.

## 2016-12-29 NOTE — Telephone Encounter (Signed)
Patient states that she is feeling better and she did eat something. Patient also  states that she lost her monitor and is will purchase another one next week when she gets paid. Advise patient to give the office a call with some updated blood sugar readings. Patient states that she will. Nothing further needed

## 2016-12-29 NOTE — Progress Notes (Signed)
Nutrition follow-up completed with patient receiving radiation therapy for tongue cancer.  Weight decreased and documented as 229.6 pounds decreased from 234.2 pounds, December 4. Patient reports she feels better this week. She is tolerating 2-3 bottles Osmolite 1.5 via PEG daily. She understands her goal rate for Osmolite 1.5-6, and a half cans daily. She is drinking water and fruit juice by mouth. She dislikes ensure. Reported CBG was very low and documented as 42 yesterday. Patient denies other nutrition impact symptoms.  Estimated energy needs: 2200-2400 calories, 110-125 grams protein, 2.4 L fluid.  Nutrition diagnosis: Inadequate oral intake continues.  Intervention: Expressed importance of patient increasing oral intake and tube feeding to meet greater than 90% of estimated needs. I again stressed importance of increasing Osmolite 1.5-1 bottle 4 times a day with a gradual increase to 1-1/2 bottles 4 times a day with 60 mL free water before and after feedings. I worked out a feeding schedule for patient.  She is agreeable. Reviewed foods patient should try by mouth. Questions were answered.  Teach back method used.  Monitoring, evaluation, goals: Patient will tolerate increased oral intake and tube feedings to meet greater than 90%  estimated nutrition needs for weight maintenance.  Next visit: Monday, December 17.    **Disclaimer: This note was dictated with voice recognition software. Similar sounding words can inadvertently be transcribed and this note may contain transcription errors which may not have been corrected upon publication of note.**

## 2016-12-30 ENCOUNTER — Ambulatory Visit
Admission: RE | Admit: 2016-12-30 | Discharge: 2016-12-30 | Disposition: A | Discharge status: Home / Self Care | Payer: BC Managed Care – PPO | Source: Ambulatory Visit | Attending: Radiation Oncology | Admitting: Radiation Oncology

## 2016-12-30 DIAGNOSIS — Z510 Encounter for antineoplastic radiation therapy: Secondary | ICD-10-CM | POA: Diagnosis not present

## 2016-12-31 ENCOUNTER — Ambulatory Visit
Admission: RE | Admit: 2016-12-31 | Discharge: 2016-12-31 | Disposition: A | Discharge status: Home / Self Care | Payer: BC Managed Care – PPO | Source: Ambulatory Visit | Attending: Radiation Oncology | Admitting: Radiation Oncology

## 2016-12-31 ENCOUNTER — Encounter: Payer: Self-pay | Admitting: *Deleted

## 2016-12-31 ENCOUNTER — Other Ambulatory Visit: Payer: Self-pay | Admitting: Radiation Oncology

## 2016-12-31 ENCOUNTER — Ambulatory Visit: Payer: BC Managed Care – PPO

## 2016-12-31 DIAGNOSIS — C760 Malignant neoplasm of head, face and neck: Secondary | ICD-10-CM

## 2016-12-31 DIAGNOSIS — Z510 Encounter for antineoplastic radiation therapy: Secondary | ICD-10-CM | POA: Diagnosis not present

## 2016-12-31 MED ORDER — LORAZEPAM 0.5 MG PO TABS: ORAL_TABLET | ORAL | 0 refills | Status: DC

## 2016-12-31 MED ORDER — SONAFINE EX EMUL
1.0000 "application " | Freq: Two times a day (BID) | CUTANEOUS | Status: DC
  Administered 2016-12-31: 1 via TOPICAL

## 2016-12-31 MED ORDER — HYDROCODONE-ACETAMINOPHEN 5-325 MG PO TABS: 1.0000 | ORAL_TABLET | ORAL | 0 refills | Status: DC | PRN

## 2016-12-31 MED FILL — LORazepam 0.5 MG TABS: 0.5 | 10 days supply | Qty: 30 | Fill #0

## 2016-12-31 MED FILL — HYDROCODON-APAP 5-325: 5-325 | 5 days supply | Qty: 50 | Fill #0

## 2016-12-31 NOTE — Progress Notes (Signed)
Oncology Nurse Navigator Documentation  Met with Ms. Eskew following RT.  Her last tmt is tomorrow. I provided verbal/written post-RT guidance:  Importance of keeping all follow-up appts, especially those with Nutrition and SLP.  Importance of protecting treatment area from sun.  Continuation of Sonafine application 2-3 times daily until supply exhausted after which transition to OTC lotion with vitamin E. I explained that my role as navigator will continue for several more months and that I will be calling and/or joining him during follow-up visits.   I encouraged him to call me with needs/concerns.   She verbalized understanding of information provided.  Gayleen Orem, RN, BSN, [place] at [place] [phone]

## 2017-01-01 ENCOUNTER — Ambulatory Visit
Admission: RE | Admit: 2017-01-01 | Discharge: 2017-01-01 | Disposition: A | Discharge status: Home / Self Care | Payer: BC Managed Care – PPO | Source: Ambulatory Visit | Attending: Radiation Oncology | Admitting: Radiation Oncology

## 2017-01-01 DIAGNOSIS — Z510 Encounter for antineoplastic radiation therapy: Secondary | ICD-10-CM | POA: Diagnosis not present

## 2017-01-03 ENCOUNTER — Ambulatory Visit: Payer: BC Managed Care – PPO

## 2017-01-03 ENCOUNTER — Ambulatory Visit: Payer: BC Managed Care – PPO | Attending: Radiation Oncology

## 2017-01-03 ENCOUNTER — Ambulatory Visit: Payer: BC Managed Care – PPO | Admitting: Nutrition

## 2017-01-03 ENCOUNTER — Other Ambulatory Visit: Payer: Self-pay

## 2017-01-03 DIAGNOSIS — R1312 Dysphagia, oropharyngeal phase: Secondary | ICD-10-CM | POA: Diagnosis not present

## 2017-01-03 NOTE — Therapy (Signed)
[facility] [address] [place], [place], [address] Phone: [phone]   Fax:  [phone]  Speech Language Pathology Treatment  Patient Details  Name: Kristen [name] MRN: [id number] Date of Birth: 08-Jun-1965 Referring Provider: Eppie Gibson, MD   Encounter Date: 01/03/2017  End of Session - 01/03/17 1450    Visit Number  2    Number of Visits  7    Date for SLP Re-Evaluation  06/10/17    SLP Start Time  0630 pt 10 minutes late, SLP searching for pt    SLP Stop Time   1108    SLP Time Calculation (min)  27 min    Activity Tolerance  Patient tolerated treatment well       Past Medical History:  Diagnosis Date  . Allergy   . Anemia    Iron deficiency  . Anxiety   . Arthritis    back, left shoulder  . Chicken pox   . Depression   . Diarrhea    takes Imodium daily  . GERD (gastroesophageal reflux disease)   . H/O hiatal hernia   . Headache(784.0)   . History of kidney stones    1996ish  . Hypertension   . Migraine    none for 5 years (as of 01/13/16)  . OSA (obstructive sleep apnea) 10/13/2015   unable to get cpap, plans to get one in 2018  . Pneumonia   . Restless legs   . Scoliosis   . Shortness of breath    with exertion    Past Surgical History:  Procedure Laterality Date  . BACK SURGERY  07/21/1978  . CHOLECYSTECTOMY N/A 08/15/2013   Procedure: LAPAROSCOPIC CHOLECYSTECTOMY WITH INTRAOPERATIVE CHOLANGIOGRAM;  Surgeon: Gwenyth Ober, MD;  Location: [facility];  Service: General;  Laterality: N/A;  . COLONOSCOPY N/A 01/09/2013   Procedure: COLONOSCOPY;  Surgeon: Beryle Beams, MD;  Location: [facility];  Service: Endoscopy;  Laterality: N/A;  . DIRECT LARYNGOSCOPY  07/2016   Dr. Nicolette Bang [facility]  . GASTROSTOMY TUBE PLACEMENT  09/27/2016  . HERNIA REPAIR Left 1981  . IR PATIENT EVAL TECH 0-60 MINS  12/13/2016  . MODIFIED RADICAL NECK DISSECTION Left 09/27/2016   Levels 1 & 2  . PARTIAL  GLOSSECTOMY Left 09/27/2016   Left hemi partial glossectomy  . TONSILLECTOMY    . tracheotomy  09/27/2016  . TUBAL LIGATION  06/1988    There were no vitals filed for this visit.  Subjective Assessment - 01/03/17 1051    Subjective  Pt went downstairs for ST. She was informed to remain in lobby for ST appointments from now on.            ADULT SLP TREATMENT - 01/03/17 1052      General Information   Behavior/Cognition  Cooperative;Pleasant mood;Alert      Treatment Provided   Treatment provided  Dysphagia      Dysphagia Treatment   Temperature Spikes Noted  No    Oral Cavity - Dentition  Edentulous    Treatment Methods  Skilled observation;Therapeutic exercise    Patient observed directly with PO's  Yes    Type of PO's observed  Dysphagia 1 (puree);Thin liquids    Oral Phase Signs & Symptoms  -- none noted    Pharyngeal Phase Signs & Symptoms  -- none noted    Other treatment/comments  Pt has been eating soft foods like canned spaghetti, and is drinking juices. Pt has not been compliant with  HEP last two and a half weeks at all. She reports twice to three times a week completion prior to that time. SLP re-educated pt that she will need to get exercises in at least twice a day to avert muscle fibrosis. Pt told SLP independently why she is completing HEP. She req'd min-mod A usually with HEP procedure today. She return demonstrated all exercsies except Masako. SLP spent 3 minutes on Masako with pt instead of SLP demonstrate, pt return demonstrate as on remainder of HEP. SLP observed more lingual movement to lt today than at eval.       Assessment / Recommendations / [place] with current plan of care      Progression Toward Goals   Progression toward goals  Progressing toward goals       SLP Education - 01/03/17 1449    Education provided  Yes    Education Details  HEP procedure    Person(s) Educated  Patient    Methods  Explanation;Verbal  cues;Demonstration;Handout    Comprehension  Verbalized understanding;Returned demonstration;Verbal cues required       SLP Short Term Goals - 01/03/17 1453      SLP SHORT TERM GOAL #1   Title  pt will complete HEP with rare min A over two sessions    Time  1    Period  -- visits    Status  On-going      SLP SHORT TERM GOAL #2   Title  pt will tell SLP why she is completing HEP     Status  Achieved      SLP SHORT TERM GOAL #3   Title  pt will tell why a food journal can be helpful in returning to pre-radiation diet    Time  1    Period  -- visits      SLP SHORT TERM GOAL #4   Title  pt will provide SLP 3 overt s/s aspiration PNA with modified independence    Time  2    Period  -- vistis    Status  On-going       SLP Long Term Goals - 01/03/17 1453      SLP LONG TERM GOAL #1   Title  pt will complete HEP with modified independence over three sessions     Time  5    Period  -- visits    Status  New      SLP LONG TERM GOAL #2   Title  pt will tell SLP when HEP frequency can be reduced to x2-3/week    Time  5    Period  -- visits    Status  New      SLP LONG TERM GOAL #3   Title  pt will tell SLP 3 overt s/s of aspiration PNA with modified independence over three sessions    Time  5    Period  -- visits    Status  New       Plan - 01/03/17 1452    Clinical Impression Statement  Pt with oropharyngeal swallowing WFL for dys I items and thin liquids today. The probability of swallowing difficulty remains high with the initiation of radiation therapy. Pt will require close watch on overt s/s aspiration PNA. Pt will need to cont to be followed by SLP for regular assessment of accurate HEP completion as well as for safety with POs both during and following treatment/s.    Speech Therapy Frequency  --  approx once every 4 weeks    Duration  -- 6 visits    Treatment/Interventions  Aspiration precaution training;Pharyngeal strengthening exercises;Diet toleration management  by SLP;Trials of upgraded texture/liquids;Internal/external aids;Patient/family education;Compensatory strategies;SLP instruction and feedback;Cueing hierarchy;Environmental controls any or all may be used    Potential to Achieve Goals  Good    Potential Considerations  Previous level of function    SLP Home Exercise Plan  provided today    Consulted and Agree with Plan of Care  Patient       Patient will benefit from skilled therapeutic intervention in order to improve the following deficits and impairments:   Dysphagia, oropharyngeal    Problem List Patient Active Problem List   Diagnosis Date Noted  . 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[place]) 10/29/2016  . Headache associated with sexual activity 05/14/2016  . Tongue lesion 05/14/2016  . Loose stools 03/12/2016  . Hypocalcemia 01/22/2016  . Thrombocytopenia ([place]) 01/22/2016  . Chronic diarrhea 01/22/2016  . Chest pain   . Essential hypertension   . Orthostatic hypotension   . Spinal stenosis of lumbar region 01/20/2016    Class: Chronic  . DDD (degenerative disc disease), lumbar 01/20/2016    Class: Chronic  . Spinal stenosis of lumbar region with neurogenic claudication 01/20/2016  . Low back pain 12/22/2015  . Pre-op examination 12/22/2015  . OSA (obstructive sleep apnea) 10/13/2015  . Muscle cramp 08/01/2015  . GERD (gastroesophageal reflux disease) 07/09/2015  . Excessive daytime sleepiness 06/18/2015  . Shingles 08/28/2013  . Postop check 08/28/2013  . Cholelithiasis 07/10/2013  . Colitis 01/08/2013  . Postoperative anemia 01/08/2013  . Morbid obesity with BMI of 40.0-44.9, adult ([place]) 01/08/2013    [facility] ,[place], [place]  01/03/2017, 2:54 PM  [facility] [address] [place], [place], [address] Phone: [phone]   Fax:  [phone]   Name: WILMA MICHAELSON MRN:  [id number] Date of Birth: [birth year]

## 2017-01-03 NOTE — Progress Notes (Signed)
Nutrition follow-up completed with patient status post radiation therapy for tongue cancer.   Patient completed treatment on Saturday, December 15. Weight improved documented as 238.8 pounds today. Patient reports she tolerates one bottle Osmolite 1.5 via PEG every 4 hours for a total of 4 bottles daily. Reports she is able to eat spaghetti and meat sauce, drink orange juice and ginger ale. Patient complains of increased fatigue.  Estimated energy needs: 2200-2400 calories, 110-125 grams protein, 2.4 L fluid.  4 bottles Osmolite 1.5 provides 1420 cal, 59.6 g protein, 724 mL free water.  Nutrition diagnosis:  Inadequate oral intake continues.  Intervention: Recommended patient increase Osmolite 1.5 to goal rate of 6 bottles daily.  (Currently patient is refusing to increase tube feeding.) I have encouraged patient to continue oral intake as tolerated. Provided samples of Osmolite 1.5. Questions were answered.  Teach back method used.  Monitoring, evaluation, goals: Patient will work to increase oral intake/tube feeding to meet greater than 90% of estimated nutrition needs.  Next visit: Monday, January 14.  **Disclaimer: This note was dictated with voice recognition software. Similar sounding words can inadvertently be transcribed and this note may contain transcription errors which may not have been corrected upon publication of note.**

## 2017-01-04 ENCOUNTER — Ambulatory Visit: Payer: BC Managed Care – PPO

## 2017-01-05 ENCOUNTER — Encounter: Payer: Self-pay | Admitting: Radiation Oncology

## 2017-01-05 NOTE — Progress Notes (Signed)
  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 She had mild mucositis over the palate and hyperpigmentation of her skin in the treatment fields  Plan: The patient has completed radiation treatment. The patient will return to radiation oncology clinic for routine followup in one month. I advised them to call or return sooner if they have any questions or concerns related to their recovery or treatment.  -----------------------------------  Eppie Gibson, MD  This document serves as a record of services personally performed by Eppie Gibson, MD. It was created on her behalf by Valeta Harms, a trained medical scribe. The creation of this record is based on the scribe's personal observations and the provider's statements to them. This document has been checked and approved by the attending provider.

## 2017-01-06 ENCOUNTER — Encounter: Payer: Self-pay | Admitting: Radiation Oncology

## 2017-01-06 NOTE — Progress Notes (Signed)
Kristen [name] presents for follow up of radiation completed 01/01/17 to her base of tongue.   Pain issues, if any: 8/10 throat  "Says she is out of all of her pain medication". Appears drowsy. Using a feeding tube?: Instilling Osmolite one can qid and all medications. Weight changes, if any:  Wt Readings from Last 3 Encounters:  01/14/17 216 lb 9.6 oz (98.2 kg)  01/03/17 238 lb 12.8 oz (108.3 kg)  12/29/16 229 lb 9.6 oz (104.1 kg)   ARIA weights: 12/23/16: 232.6 lbs 12/28/16: 226.2 lbs 12/31/16 230.0 lbs Swallowing issues, if any: Reports she usually does not have problems swallowing food or liquids.  This morning she reports having some swalowing problems. Having thick secretions this morning unable to cough up the secretions.  Has not had to use the special baking soda solution, plans to star using her instructions that have been provided and reviewed by Liliane Channel the educator and Juliette Mangle. Smoking or chewing tobacco? No Using fluoride trays daily? No Last ENT visit was on: No Other notable issues, if any: Dr. Nicolette Bang last on 11/03/16. She has appointments with Dory Peru RD on 01/31/17 and Garald Balding on 02/02/17. BP (!) 131/91 (BP Location: Left Arm, Patient Position: Sitting, Cuff Size: Normal)   Pulse (!) 114   Temp 99.1 F (37.3 C) (Oral)   Resp 18   Ht 5' 9.75" (1.772 m)   Wt 216 lb 9.6 oz (98.2 kg)   LMP 11/15/2013   SpO2 96%   BMI 31.30 kg/m   BP 132/82 (BP Location: Left Arm)   Pulse (!) 114   Temp 99.1 F (37.3 C) (Oral)   Resp 18   Ht 5' 9.75" (1.772 m)   Wt 216 lb 9.6 oz (98.2 kg)   LMP 11/15/2013   SpO2 96%   BMI 31.30 kg/m

## 2017-01-11 ENCOUNTER — Other Ambulatory Visit (INDEPENDENT_AMBULATORY_CARE_PROVIDER_SITE_OTHER): Payer: Self-pay | Admitting: Orthopaedic Surgery

## 2017-01-14 ENCOUNTER — Ambulatory Visit
Admission: RE | Admit: 2017-01-14 | Discharge: 2017-01-14 | Disposition: A | Discharge status: Home / Self Care | Payer: BC Managed Care – PPO | Source: Ambulatory Visit | Attending: Radiation Oncology | Admitting: Radiation Oncology

## 2017-01-14 ENCOUNTER — Encounter: Payer: Self-pay | Admitting: Radiation Oncology

## 2017-01-14 ENCOUNTER — Other Ambulatory Visit: Payer: Self-pay

## 2017-01-14 VITALS — BP 132/82 | HR 114 | Temp 99.1°F | Resp 18 | Ht 69.75 in | Wt 216.6 lb

## 2017-01-14 DIAGNOSIS — Z931 Gastrostomy status: Secondary | ICD-10-CM | POA: Diagnosis not present

## 2017-01-14 DIAGNOSIS — Z923 Personal history of irradiation: Secondary | ICD-10-CM | POA: Insufficient documentation

## 2017-01-14 DIAGNOSIS — Z7982 Long term (current) use of aspirin: Secondary | ICD-10-CM | POA: Diagnosis not present

## 2017-01-14 DIAGNOSIS — R070 Pain in throat: Secondary | ICD-10-CM | POA: Insufficient documentation

## 2017-01-14 DIAGNOSIS — C0680 Malignant neoplasm of overlapping sites of unspecified parts of mouth: Secondary | ICD-10-CM

## 2017-01-14 DIAGNOSIS — Z79899 Other long term (current) drug therapy: Secondary | ICD-10-CM | POA: Diagnosis not present

## 2017-01-14 DIAGNOSIS — C01 Malignant neoplasm of base of tongue: Secondary | ICD-10-CM

## 2017-01-14 DIAGNOSIS — IMO0001 Reserved for inherently not codable concepts without codable children: Secondary | ICD-10-CM

## 2017-01-14 DIAGNOSIS — C760 Malignant neoplasm of head, face and neck: Secondary | ICD-10-CM | POA: Insufficient documentation

## 2017-01-14 DIAGNOSIS — E860 Dehydration: Secondary | ICD-10-CM | POA: Insufficient documentation

## 2017-01-14 HISTORY — DX: Personal history of irradiation: Z92.3

## 2017-01-14 LAB — BASIC METABOLIC PANEL
Anion Gap: 8 mEq/L (ref 3–11)
BUN: 9.4 mg/dL (ref 7.0–26.0)
CO2: 28 mEq/L (ref 22–29)
CREATININE: 0.8 mg/dL (ref 0.6–1.1)
Calcium: 9.2 mg/dL (ref 8.4–10.4)
Chloride: 108 mEq/L (ref 98–109)
GLUCOSE: 101 mg/dL (ref 70–140)
Potassium: 4.2 mEq/L (ref 3.5–5.1)
Sodium: 144 mEq/L (ref 136–145)

## 2017-01-14 MED ORDER — PHENYLEPHRINE-GUAIFENESIN 7.5-100 MG/5ML PO LIQD: ORAL | 1 refills | Status: DC

## 2017-01-14 MED ORDER — HYDROCODONE-ACETAMINOPHEN 5-325 MG PO TABS: 1.0000 | ORAL_TABLET | ORAL | 0 refills | Status: DC | PRN

## 2017-01-14 MED ORDER — DOXYLAMINE-DM 6.25-15 MG/15ML PO LIQD: ORAL | 0 refills | Status: DC

## 2017-01-14 MED FILL — HYDROCODON-APAP 5-325: 5-325 | 5 days supply | Qty: 60 | Fill #0

## 2017-01-14 MED FILL — SM NITE TIME COLD-FLU LIQ: 15-6.25-325 | 11 days supply | Qty: 355 | Fill #0

## 2017-01-14 NOTE — Progress Notes (Signed)
Radiation Oncology         [phone] ________________________________  Name: Kristen [name] MRN: [id number]  Date: 01/14/2017  DOB: February 09, 1965  Follow-Up Visit Note  CC: [name], MD  Francina Ames, MD  Diagnosis and Prior Radiotherapy:       ICD-10-CM   1. 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[place]) P32.95 Basic metabolic panel  2. 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 Malignant neoplasm of base of tongue ([facility]) C01 Phenylephrine-Guaifenesin 7.5-100 MG/5ML LIQD    Doxylamine-DM (VICKS NYQUIL COUGH) 6.25-15 MG/15ML LIQD  4. 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 She is not feeling well today from what she believes is a cold. She reports congestion, runny nose, and coughing. She reports gagging but denies vomiting. She has dizziness with standing and feels a little dehydrated.  Pain issues, if any: She reports 5/10 throat and mouth pain. She also reports pain in her back and hips. She states that she is out of all of her pain medication. She is not using lidocaine.  Using a feeding tube?: Yes, she is instilling Osmolite one can qd and all medications.  Weight changes, if any:  Wt Readings from Last 3 Encounters:  01/14/17 216 lb 9.6 oz (98.2 kg)  01/03/17 238 lb 12.8 oz (108.3 kg)  12/29/16 229 lb 9.6 oz (104.1 kg)   Swallowing issues, if any: She reports she usually does not have problems swallowing food or liquids. This morning she reports having some swallowing  problems. She is having thick secretions this morning and is unable to cough up the secretions. She has not had to use the special baking soda gargle solution but plans to start using.  Smoking or chewing tobacco? No  Using fluoride trays daily? No  Last ENT visit was on: 11/03/2016 with Dr. Nicolette Bang  Other notable issues, if any: She has appointments with Dory Peru, RD on 01/31/2017 and Garald Balding on 02/02/2017.                ALLERGIES:  is allergic to other and sulfur.  Meds: Current Outpatient Medications  Medication Sig Dispense Refill  . amLODipine (NORVASC) 10 MG tablet Take 1 tablet (10 mg total) daily by mouth. 90 tablet 3  . aspirin 81 MG chewable tablet 81 mg.    . baclofen (LIORESAL) 10 MG tablet Take 1 tablet (10 mg total) 2 (two) times daily by mouth. 180 tablet 4  . calcium carbonate (TUMS EX) 750 MG chewable tablet Chew 750 mg by mouth.    . chlorhexidine (PERIDEX) 0.12 % solution     . Eluxadoline (VIBERZI) 100 MG TABS Take 1 tablet (100 mg total) 2 (two) times daily by mouth. 90 tablet 3  . gabapentin (NEURONTIN) 300 MG capsule Take 1 capsule by mouth 3 times daily. 90 capsule 0  . indomethacin (INDOCIN) 25 MG capsule Take 1 capsule by mouth 30-60 minutes prior to sexual activity. 30 capsule 0  . lidocaine (XYLOCAINE) 2 % solution Mix 1 part 2%viscous lidocaine,1part  H2O.Swish and/or swallow 55m of this mixture,367m before meals and at bedtime, up to QID 100 mL 5  . lidocaine (XYLOCAINE) 5 % ointment Apply topically.    . Marland Kitchenisinopril (PRINIVIL,ZESTRIL) 40 MG tablet Take 1 tablet (40 mg total) daily by mouth. 90 tablet 3  . LORazepam (ATIVAN) 0.5 MG tablet Take 1 tab PRN nausea. Do not take more than 3 pills daily. 30 tablet 0  . nystatin (MYCOSTATIN) 100000 UNIT/ML suspension Take by mouth.    . pantoprazole (PROTONIX) 40 MG tablet Take 1 tablet (40 mg total) daily by mouth. 90 tablet 3  . Potassium 99 MG TABS 1 tablet by Gastrostomy Tube route daily. 330 each 0  .  tiZANidine (ZANAFLEX) 4 MG tablet TAKE 1 TABLET BY MOUTH EVERY 6 HOURS AS NEEDED 60 tablet 0  . diclofenac (VOLTAREN) 75 MG EC tablet TAKE ONE TABLET BY MOUTH TWICE DAILY WITH FOOD AS NEEDED FOR PAIN (Patient not taking: Reported on 01/14/2017) 180 tablet 3  . Diclofenac Sodium 1.5 % SOLN     . Doxylamine-DM (VICKS NYQUIL COUGH) 6.25-15 MG/15ML LIQD Take 3026mefore bedtime for cold/cough. 236 mL 0  . HYDROcodone-acetaminophen (NORCO/VICODIN) 5-325 MG tablet Take 1-2 tablets by mouth every 4 (four) hours as needed for moderate pain. Taper as pain improves. 60 tablet 0  . loperamide (IMODIUM) 2 MG capsule Take 2-4 mg by mouth 2 (two) times daily as needed for diarrhea or loose stools.     . Phenylephrine-Guaifenesin 7.5-100 MG/5ML LIQD Take 17m78m6 hrs PRN congestion and cough during daytime only. 150 mL 1  . SUMAtriptan (IMITREX) 100 MG tablet Take 0.5-1 tablet by mouth at the onset of migraine and may repeat in 2 hours if headache persists or recurs. (Patient not taking: Reported on 01/14/2017) 10 tablet 0  . traMADol (ULTRAM) 50 MG tablet Take 2 tablets (100 mg total) by mouth every 6 (six) hours as needed. (Patient not taking: Reported on 01/14/2017) 60 tablet 0   No current facility-administered medications for this encounter.     Physical Findings: Wt Readings from Last 3 Encounters:  01/14/17 216 lb 9.6 oz (98.2 kg)  01/03/17 238 lb 12.8 oz (108.3 kg)  12/29/16 229 lb 9.6 oz (104.1 kg)    height is 5' 9.75" (1.772 m) and weight is 216 lb 9.6 oz (98.2 kg). Her oral temperature is 99.1 F (37.3 C). Her blood pressure is 132/82 and her pulse is 114 (abnormal). Her respiration is 18 and oxygen saturation is 96%.  General: Alert and oriented. She is congested and appears uncomfortable. HEENT: Head is normocephalic. Extraocular movements are intact. Oropharynx: She has no visible mucositis in her throat. Her mucous membranes are relatively moist. No thrush. Skin: Skin in treatment fields  shows satisfactory healing and shows resolving  dry desquamation over her neck.    Lab Findings: Lab Results  Component Value Date   WBC 3.9 12/23/2016   HGB 12.1 12/23/2016   HCT 38.6 12/23/2016   MCV 86.2 12/23/2016   PLT 208 12/23/2016    Lab Results  Component Value Date   TSH 1.121 11/05/2016   CMP     Component Value Date/Time   NA 144 01/14/2017 1035   K 4.2 01/14/2017 1035   CL 102 10/06/2016 2214   CO2 28 01/14/2017 1035   GLUCOSE 101 01/14/2017 1035   BUN 9.4 01/14/2017 1035   CREATININE 0.8 01/14/2017 1035   CALCIUM 9.2 01/14/2017 1035   PROT 7.6 10/06/2016 2214  ALBUMIN 3.6 10/06/2016 2214   AST 24 10/06/2016 2214   ALT 49 10/06/2016 2214   ALKPHOS 135 (H) 10/06/2016 2214   BILITOT 0.8 10/06/2016 2214   GFRNONAA >60 10/06/2016 2214   GFRAA >60 10/06/2016 2214    Radiographic Findings: No results found.  Impression/Plan:    1) Head and Neck Cancer Status: Healing from radiation but affected by a cold right now. We will proceed with some lab work to rule out electrolyte abnormalities or excessive dehydration (results reassuring). I refilled her hydrocodone and sent a note to Dr. Louanne Skye telling him he can resume pain management with tramadol when he sees her in January. I suspect most of her throat pain will be resolved by then.  2) Nutritional Status: Fluctuating weight changes. Weight down.  Discussed nutrition, push this. PEG tube: Instilling one can of Osmolite everyday. Needs more. F/u with Dory Peru  4) Swallowing:  SLP following  6) Thyroid function WNL and unlikely to be affected by RT:  Lab Results  Component Value Date   TSH 1.121 11/05/2016    7) Other: I advised the patient to use Vitamin E lotion to promote skin healing.  8) I will defer to Dr. Nicolette Bang for post-treatment imaging. I will see her back for follow-up in 4 months, sooner if needed. I will ask Gayleen Orem, RN, our Head and Neck Oncology Navigator to verify that she has a  follow-up with Dr. Nicolette Bang.    I spent 15 minutes face to face with the patient and more than 50% of that time was spent in counseling and/or coordination of care. _____________________________________   Eppie Gibson, MD  This document serves as a record of services personally performed by Eppie Gibson, MD. It was created on her behalf by Rae Lips, a trained medical scribe. The creation of this record is based on the scribe's personal observations and the provider's statements to them. This document has been checked and approved by the attending provider.

## 2017-01-15 ENCOUNTER — Emergency Department (HOSPITAL_COMMUNITY): Payer: BC Managed Care – PPO

## 2017-01-15 ENCOUNTER — Other Ambulatory Visit: Payer: Self-pay

## 2017-01-15 ENCOUNTER — Emergency Department (HOSPITAL_COMMUNITY)
Admission: EM | Admit: 2017-01-15 | Discharge: 2017-01-16 | Disposition: A | Discharge status: Home / Self Care | Payer: BC Managed Care – PPO | Attending: Emergency Medicine | Admitting: Emergency Medicine

## 2017-01-15 ENCOUNTER — Encounter (HOSPITAL_COMMUNITY): Payer: Self-pay | Admitting: Emergency Medicine

## 2017-01-15 DIAGNOSIS — Z4659 Encounter for fitting and adjustment of other gastrointestinal appliance and device: Secondary | ICD-10-CM

## 2017-01-15 DIAGNOSIS — I10 Essential (primary) hypertension: Secondary | ICD-10-CM | POA: Insufficient documentation

## 2017-01-15 DIAGNOSIS — M545 Low back pain, unspecified: Secondary | ICD-10-CM

## 2017-01-15 DIAGNOSIS — K9423 Gastrostomy malfunction: Secondary | ICD-10-CM | POA: Insufficient documentation

## 2017-01-15 DIAGNOSIS — Z7982 Long term (current) use of aspirin: Secondary | ICD-10-CM | POA: Insufficient documentation

## 2017-01-15 DIAGNOSIS — Z79899 Other long term (current) drug therapy: Secondary | ICD-10-CM | POA: Insufficient documentation

## 2017-01-15 DIAGNOSIS — N3000 Acute cystitis without hematuria: Secondary | ICD-10-CM | POA: Diagnosis not present

## 2017-01-15 DIAGNOSIS — Z882 Allergy status to sulfonamides status: Secondary | ICD-10-CM | POA: Insufficient documentation

## 2017-01-15 LAB — CBC
HCT: 37 % (ref 36.0–46.0)
HEMOGLOBIN: 11.5 g/dL — AB (ref 12.0–15.0)
MCH: 27 pg (ref 26.0–34.0)
MCHC: 31.1 g/dL (ref 30.0–36.0)
MCV: 86.9 fL (ref 78.0–100.0)
Platelets: 152 10*3/uL (ref 150–400)
RBC: 4.26 MIL/uL (ref 3.87–5.11)
RDW: 15.4 % (ref 11.5–15.5)
WBC: 5.2 10*3/uL (ref 4.0–10.5)

## 2017-01-15 LAB — BASIC METABOLIC PANEL
Anion gap: 9 (ref 5–15)
BUN: 20 mg/dL (ref 6–20)
CHLORIDE: 103 mmol/L (ref 101–111)
CO2: 28 mmol/L (ref 22–32)
CREATININE: 1.04 mg/dL — AB (ref 0.44–1.00)
Calcium: 8.7 mg/dL — ABNORMAL LOW (ref 8.9–10.3)
GFR calc Af Amer: >60 mL/min (ref >60)
GFR calc non Af Amer: >60 mL/min (ref >60)
GLUCOSE: 101 mg/dL — AB (ref 65–99)
POTASSIUM: 4.3 mmol/L (ref 3.5–5.1)
SODIUM: 140 mmol/L (ref 135–145)

## 2017-01-15 LAB — URINALYSIS, ROUTINE W REFLEX MICROSCOPIC
Bilirubin Urine: NEGATIVE
GLUCOSE, UA: NEGATIVE mg/dL
Hgb urine dipstick: NEGATIVE
KETONES UR: NEGATIVE mg/dL
Nitrite: POSITIVE — AB
Protein, ur: NEGATIVE mg/dL
Specific Gravity, Urine: 1.008 (ref 1.005–1.030)
pH: 5 (ref 5.0–8.0)

## 2017-01-15 MED ORDER — IOPAMIDOL (ISOVUE-300) INJECTION 61%
INTRAVENOUS | Status: AC
  Administered 2017-01-15: 30 mL via GASTROSTOMY
  Filled 2017-01-15: qty 30

## 2017-01-15 MED ORDER — CEPHALEXIN 500 MG PO CAPS
500.0000 mg | ORAL_CAPSULE | Freq: Once | ORAL | Status: AC
  Administered 2017-01-15: 500 mg via ORAL
  Filled 2017-01-15: qty 1

## 2017-01-15 MED ORDER — HYDROCODONE-ACETAMINOPHEN 5-325 MG PO TABS
1.0000 | ORAL_TABLET | ORAL | Status: AC
  Administered 2017-01-15: 1 via ORAL
  Filled 2017-01-15: qty 1

## 2017-01-15 MED ORDER — TRAMADOL HCL 50 MG PO TABS: 50.0000 mg | ORAL_TABLET | Freq: Four times a day (QID) | ORAL | 0 refills | Status: DC | PRN

## 2017-01-15 MED ORDER — CEPHALEXIN 500 MG PO CAPS: 500.0000 mg | ORAL_CAPSULE | Freq: Two times a day (BID) | ORAL | 0 refills | Status: DC

## 2017-01-15 NOTE — Discharge Instructions (Addendum)
Take the antibiotics as prescribed, follow-up with your primary care doctor to make sure the infection has resolved

## 2017-01-15 NOTE — ED Provider Notes (Signed)
[place] DEPT Provider Note   CSN: [id number] Arrival date & time: 01/15/17  1854     History   Chief Complaint Chief Complaint  Patient presents with  . feeding tube came out   . Back Pain    HPI Kristen [name] is a 51 y.o. female.  HPI Patient presented to the emergency room for evaluation of a displaced feeding tube.  Patient states she has had a feeding tube in place since September.  She has a history of oral cancer and had a feeding tube placed because of swallowing difficulties.  She accidentally pulled it out today.  Patient is here to try to get that feeding tube replaced.  Patient also is complaining of some pain in her lower back.  She does not recall any falls.  She does have a history of chronic back pain but it got worse today.  The pain is sharp in her lower back.  Increases with movements.  Patient has history of prior surgery and is worried that something shifted.  She is requesting x-rays Past Medical History:  Diagnosis Date  . Allergy   . Anemia    Iron deficiency  . Anxiety   . Arthritis    back, left shoulder  . Chicken pox   . Depression   . Diarrhea    takes Imodium daily  . GERD (gastroesophageal reflux disease)   . H/O hiatal hernia   . Headache(784.0)   . History of kidney stones    1996ish  . History of radiation therapy 11/16/16- 01/01/17   Base of Tongue/ 66 gy in 33 fractions/ Dose: 2 Gy  . Hypertension   . Migraine    none for 5 years (as of 01/13/16)  . OSA (obstructive sleep apnea) 10/13/2015   unable to get cpap, plans to get one in 2018  . Pneumonia   . Restless legs   . Scoliosis   . Shortness of breath    with exertion    Patient Active Problem List   Diagnosis Date Noted  . 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 Malignant neoplasm of base of tongue ([place]) 10/29/2016  . 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 Headache associated with sexual  activity 05/14/2016  . Tongue lesion 05/14/2016  . Loose stools 03/12/2016  . Hypocalcemia 01/22/2016  . Thrombocytopenia ([place]) 01/22/2016  . Chronic diarrhea 01/22/2016  . Chest pain   . Essential hypertension   . Orthostatic hypotension   . Spinal stenosis of lumbar region 01/20/2016    Class: Chronic  . DDD (degenerative disc disease), lumbar 01/20/2016    Class: Chronic  . Spinal stenosis of lumbar region with neurogenic claudication 01/20/2016  . Low back pain 12/22/2015  . Pre-op examination 12/22/2015  . OSA (obstructive sleep apnea) 10/13/2015  . Muscle cramp 08/01/2015  . GERD (gastroesophageal reflux disease) 07/09/2015  . Excessive daytime sleepiness 06/18/2015  . Shingles 08/28/2013  . Postop check 08/28/2013  . Cholelithiasis 07/10/2013  . Colitis 01/08/2013  . Postoperative anemia 01/08/2013  . Morbid obesity with BMI of 40.0-44.9, adult ([place]) 01/08/2013    Past Surgical History:  Procedure Laterality Date  . BACK SURGERY  07/21/1978  . CHOLECYSTECTOMY N/A 08/15/2013   Procedure: LAPAROSCOPIC CHOLECYSTECTOMY WITH INTRAOPERATIVE CHOLANGIOGRAM;  Surgeon: Gwenyth Ober, MD;  Location: [facility];  Service: General;  Laterality: N/A;  . COLONOSCOPY N/A 01/09/2013   Procedure: COLONOSCOPY;  Surgeon: Beryle Beams, MD;  Location: [facility];  Service: Endoscopy;  Laterality: N/A;  . DIRECT LARYNGOSCOPY  07/2016   Dr. Nicolette Bang [facility]  . GASTROSTOMY TUBE PLACEMENT  09/27/2016  . HERNIA REPAIR Left 1981  . IR PATIENT EVAL TECH 0-60 MINS  12/13/2016  . MODIFIED RADICAL NECK DISSECTION Left 09/27/2016   Levels 1 & 2  . PARTIAL GLOSSECTOMY Left 09/27/2016   Left hemi partial glossectomy  . TONSILLECTOMY    . tracheotomy  09/27/2016  . TUBAL LIGATION  06/1988    OB History    No data available       Home Medications    Prior to Admission medications   Medication Sig Start Date End Date Taking? Authorizing Provider  amLODipine (NORVASC) 10 MG tablet Take 1 tablet  (10 mg total) daily by mouth. 11/30/16   [name], MD  aspirin 81 MG chewable tablet 81 mg. 10/05/16   [provider]  baclofen (LIORESAL) 10 MG tablet Take 1 tablet (10 mg total) 2 (two) times daily by mouth. 11/22/16 02/20/17  Jessy Oto, MD  calcium carbonate (TUMS EX) 750 MG chewable tablet Chew 750 mg by mouth. 10/04/16   [provider]  cephALEXin (KEFLEX) 500 MG capsule Take 1 capsule (500 mg total) by mouth 2 (two) times daily. 01/15/17   Dorie Rank, MD  chlorhexidine ([place]) 0.12 % solution  10/04/16   [provider]  diclofenac (VOLTAREN) 75 MG EC tablet TAKE ONE TABLET BY MOUTH TWICE DAILY WITH FOOD AS NEEDED FOR PAIN Patient not taking: Reported on 01/14/2017 11/22/16   Jessy Oto, MD  Diclofenac Sodium 1.5 % SOLN  09/01/16   [provider]  Doxylamine-DM (VICKS [place]) 6.25-15 MG/15ML LIQD Take 108m before bedtime for cold/cough. 01/14/17   SEppie Gibson MD  Eluxadoline (VIBERZI) 100 MG TABS Take 1 tablet (100 mg total) 2 (two) times daily by mouth. 11/30/16   BBillie Ruddy MD  gabapentin (NEURONTIN) 300 MG capsule Take 1 capsule by mouth 3 times daily. 01/12/17   XLeandrew Koyanagi MD  HYDROcodone-acetaminophen (NORCO/VICODIN) 5-325 MG tablet Take 1-2 tablets by mouth every 4 (four) hours as needed for moderate pain. Taper as pain improves. 01/14/17   SEppie Gibson MD  indomethacin (INDOCIN) 25 MG capsule Take 1 capsule by mouth 30-60 minutes prior to sexual activity. 05/14/16   CGolden Circle FNP  lidocaine (XYLOCAINE) 2 % solution Mix 1 part 2%viscous lidocaine,1part H2O.Swish and/or swallow 151mof this mixture,3081mbefore meals and at bedtime, up to QID 11/22/16   SquEppie GibsonD  lidocaine (XYLOCAINE) 5 % ointment Apply topically.    [provider]  lisinopril (PRINIVIL,ZESTRIL) 40 MG tablet Take 1 tablet (40 mg total) daily by mouth. 11/30/16   BanBillie RuddyD  loperamide (IMODIUM) 2 MG capsule Take 2-4  mg by mouth 2 (two) times daily as needed for diarrhea or loose stools.     [provider]  LORazepam (ATIVAN) 0.5 MG tablet Take 1 tab PRN nausea. Do not take more than 3 pills daily. 12/31/16   SquEppie GibsonD  nystatin (MYCOSTATIN) 100000 UNIT/ML suspension Take by mouth. 10/04/16   [provider]  pantoprazole (PROTONIX) 40 MG tablet Take 1 tablet (40 mg total) daily by mouth. 11/30/16   BanBillie RuddyD  Phenylephrine-Guaifenesin 7.5-100 MG/5ML LIQD Take 90m59m6 hrs PRN congestion and cough during daytime only. 01/14/17   SquiEppie Gibson  Potassium 99 MG TABS 1 tablet by Gastrostomy Tube  route daily. 11/30/16   [name], MD  SUMAtriptan (IMITREX) 100 MG tablet Take 0.5-1 tablet by mouth at the onset of migraine and may repeat in 2 hours if headache persists or recurs. Patient not taking: Reported on 01/14/2017 05/14/16   Golden Circle, FNP  tiZANidine (ZANAFLEX) 4 MG tablet TAKE 1 TABLET BY MOUTH EVERY 6 HOURS AS NEEDED 05/12/16   Jessy Oto, MD  traMADol (ULTRAM) 50 MG tablet Take 1 tablet (50 mg total) by mouth every 6 (six) hours as needed. 01/15/17   Dorie Rank, MD    Family History Family History  Problem Relation Age of Onset  . Heart disease Father   . Heart attack Father   . Hypertension Mother   . Arthritis Mother   . Diabetes Maternal Grandmother   . Diabetes Paternal Grandmother   . Diabetes Maternal Uncle     Social History Social History   Tobacco Use  . Smoking status: Never Smoker  . Smokeless tobacco: Never Used  Substance Use Topics  . Alcohol use: Yes    Alcohol/week: 4.2 oz    Types: 7 Cans of beer per week    Comment: 1 beer daily  . Drug use: No     Allergies   Other and Sulfur   Review of Systems Review of Systems  Respiratory: Positive for cough.        Recent URI, cough congestion symptoms  All other systems reviewed and are negative.    Physical Exam Updated Vital Signs BP 102/73 (BP Location:  Left Arm)   Pulse 100   Temp 100.2 F (37.9 C) (Oral)   Resp 18   LMP 11/15/2013   SpO2 100%   Physical Exam  Constitutional: She appears well-developed and well-nourished. No distress.  Overweight  HENT:  Head: Normocephalic and atraumatic.  Right Ear: External ear normal.  Left Ear: External ear normal.  Eyes: Conjunctivae are normal. Right eye exhibits no discharge. Left eye exhibits no discharge. No scleral icterus.  Neck: Neck supple. No tracheal deviation present.  Cardiovascular: Normal rate, regular rhythm and intact distal pulses.  Pulmonary/Chest: Effort normal and breath sounds normal. No stridor. No respiratory distress. She has no wheezes. She has no rales.  Abdominal: Soft. Bowel sounds are normal. She exhibits no distension. There is no tenderness. There is no rebound and no guarding.  Small ostomy site noted, no erythema or drainage  Musculoskeletal: She exhibits no edema.       Lumbar back: She exhibits tenderness.  Neurological: She is alert. She has normal strength. No cranial nerve deficit (no facial droop, extraocular movements intact, no slurred speech) or sensory deficit. She exhibits normal muscle tone. She displays no seizure activity. Coordination normal.  Skin: Skin is warm and dry. No rash noted.  Psychiatric: She has a normal mood and affect.  Nursing note and vitals reviewed.    ED Treatments / Results  Labs (all labs ordered are listed, but only abnormal results are displayed) Labs Reviewed  CBC - Abnormal; Notable for the following components:      Result Value   Hemoglobin 11.5 (*)    All other components within normal limits  BASIC METABOLIC PANEL - Abnormal; Notable for the following components:   Glucose, Bld 101 (*)    Creatinine, Ser 1.04 (*)    Calcium 8.7 (*)    All other components within normal limits  URINALYSIS, ROUTINE W REFLEX MICROSCOPIC - Abnormal; Notable for the following components:  APPearance HAZY (*)    Nitrite  POSITIVE (*)    Leukocytes, UA LARGE (*)    Bacteria, UA MANY (*)    Squamous Epithelial / LPF 0-5 (*)    All other components within normal limits      Radiology Dg Lumbar Spine Complete  Result Date: 01/15/2017 CLINICAL DATA:  Right buttock pain, no known injury, initial encounter EXAM: LUMBAR SPINE - COMPLETE 4+ VIEW COMPARISON:  10/11/2016 FINDINGS: Changes of prior fusion are noted at L3-4 and L4-5 with posterior fixation. A fixation rod is noted extending from the thoracic spine inferiorly this which is stable in appearance. No distal fixation is noted. Scoliosis concave to the right is seen. No acute hardware failure is noted. IMPRESSION: Chronic changes and postsurgical changes without acute abnormality. Electronically Signed   By: Inez Catalina M.D.   On: 01/15/2017 21:47   Dg Abdomen 1 View  Result Date: 01/15/2017 CLINICAL DATA:  Feeding tube placement. EXAM: ABDOMEN - 1 VIEW COMPARISON:  None. FINDINGS: A feeding tube was injected with contrast material. The tube is in good position in the gastric body. No contrast extravasation is demonstrated. IMPRESSION: Good position of the gastrostomy feeding tube without complicating features. Electronically Signed   By: Marijo Sanes M.D.   On: 01/15/2017 22:43    Procedures FEEDING TUBE REPLACEMENT Date/Time: 01/15/2017 10:00 PM Performed by: Dorie Rank, MD Authorized by: Dorie Rank, MD  Consent: Verbal consent obtained. Risks and benefits: risks, benefits and alternatives were discussed Consent given by: patient Patient understanding: patient states understanding of the procedure being performed Time out: Immediately prior to procedure a "time out" was called to verify the correct patient, procedure, equipment, support staff and site/side marked as required. Indications: tube dislodged Local anesthesia used: no  Anesthesia: Local anesthesia used: no  Sedation: Patient sedated: no  Tube type: gastrostomy Patient position:  supine Procedure type: replacement Tube size: 18 Fr Endoscope used: no Bulb inflation volume: 7 (ml) Bulb inflation fluid: sterile water Placement/position confirmation: x-ray Tube placement difficulty: none Patient tolerance: Patient tolerated the procedure well with no immediate complications    (including critical care time)  Medications Ordered in ED Medications  HYDROcodone-acetaminophen (NORCO/VICODIN) 5-325 MG per tablet 1 tablet (1 tablet Oral Given 01/15/17 2233)  iopamidol (ISOVUE-300) 61 % injection (30 mLs Feeding Tube Contrast Given 01/15/17 2222)  cephALEXin (KEFLEX) capsule 500 mg (500 mg Oral Given 01/15/17 2358)     Initial Impression / Assessment and Plan / ED Course  I have reviewed the triage vital signs and the nursing notes.  Pertinent labs & imaging results that were available during my care of the patient were reviewed by me and considered in my medical decision making (see chart for details).   Patient presented to the emergency room for evaluation of back pain as well as placement of her feeding tube.  Feeding tube was replaced without difficulty.  X-ray shows appropriate placement.  Her symptoms suggest musculoskeletal back pain however the patient's had a low-grade fever and suggest a urinary tract infection.  No signs of sepsis.  Plan on discharge home with oral antibiotics.  Discussed outpatient follow-up with her primary care doctor.  Final Clinical Impressions(s) / ED Diagnoses   Final diagnoses:  Encounter for care related to feeding tube  Acute cystitis without hematuria  Acute low back pain without sciatica, unspecified back pain laterality    ED Discharge Orders        Ordered    traMADol (ULTRAM) 50 MG  tablet  Every 6 hours PRN     01/15/17 2351    cephALEXin (KEFLEX) 500 MG capsule  2 times daily     01/15/17 2351       Dorie Rank, MD 01/16/17 [phone]

## 2017-01-15 NOTE — ED Triage Notes (Signed)
Pt states her feeding tube came out tonight around 6pm  Pt is also c/o pain in her right buttock area  States pain is sharp and worse with movement  Denies back pain or injury

## 2017-01-17 ENCOUNTER — Telehealth: Payer: Self-pay | Admitting: *Deleted

## 2017-01-17 NOTE — Telephone Encounter (Signed)
Oncology Nurse Navigator Documentation  Sent SECURE e-mail to [facility] RN Navigator Fransico Setters with patient update:  "Seen by Dr. Isidore Moos 12/28 for routine follow-up for adjuvant RT completed 2 wks ago (10/30-12/15/18).  Dr. Isidore Moos will see her again in 4 months.  She is deferring to Dr. Nicolette Bang for post-tmt imaging and follow-up.  Pls let me know when that appt is scheduled."  Gayleen Orem, RN, BSN, [place] at [place] [phone]

## 2017-01-31 ENCOUNTER — Encounter: Payer: BC Managed Care – PPO | Admitting: Nutrition

## 2017-02-01 ENCOUNTER — Encounter: Payer: Self-pay | Admitting: Family Medicine

## 2017-02-01 ENCOUNTER — Telehealth: Payer: Self-pay | Admitting: *Deleted

## 2017-02-01 ENCOUNTER — Inpatient Hospital Stay: Payer: BC Managed Care – PPO | Attending: Radiation Oncology | Admitting: Nutrition

## 2017-02-01 ENCOUNTER — Ambulatory Visit: Payer: BC Managed Care – PPO | Admitting: Family Medicine

## 2017-02-01 VITALS — BP 102/70 | HR 68 | Temp 98.9°F | Wt 221.1 lb

## 2017-02-01 DIAGNOSIS — C01 Malignant neoplasm of base of tongue: Secondary | ICD-10-CM

## 2017-02-01 DIAGNOSIS — G4733 Obstructive sleep apnea (adult) (pediatric): Secondary | ICD-10-CM

## 2017-02-01 NOTE — Patient Instructions (Addendum)

## 2017-02-01 NOTE — Telephone Encounter (Signed)
Oncology Nurse Navigator Documentation  Rec'd 01/20/17 secure e-mail from [place] indicating pt has 02/08/17 10:15 appt with Dr. Nicolette Bang.  She noted Dr. Nicolette Bang typically orders imaging s/p follow-up visits.  Gayleen Orem, RN, BSN Head & Neck Oncology Nurse [place] [facility] [place] [phone]

## 2017-02-01 NOTE — Progress Notes (Signed)
Nutrition follow-up completed with patient who completed treatment on December 15 for tongue cancer. Weight improved documented as 221.8 pounds. Patient is using 1-1/2 bottles of Osmolite 1.5, 3 times a day via PEG. Patient reports oral intake has increased. Reports taste buds are returning. Continues to try a variety of foods.  Nutrition diagnosis: Inadequate oral intake continues.  Intervention: Recommended patient continue Osmolite 1.5 via PEG 1-1/2 bottles to 3 times a day Patient educated to continue strategies for increasing oral intake. Answered multiple patient questions.  Teach back method used.  Monitoring, evaluation, goals: Patient will work to increase calories and protein to promote healing and weight maintenance.  Next visit: To be scheduled as needed.  **Disclaimer: This note was dictated with voice recognition software. Similar sounding words can inadvertently be transcribed and this note may contain transcription errors which may not have been corrected upon publication of note.**
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
## 2017-02-02 ENCOUNTER — Ambulatory Visit: Payer: BC Managed Care – PPO

## 2017-02-02 ENCOUNTER — Ambulatory Visit (INDEPENDENT_AMBULATORY_CARE_PROVIDER_SITE_OTHER): Payer: BC Managed Care – PPO

## 2017-02-02 ENCOUNTER — Encounter (INDEPENDENT_AMBULATORY_CARE_PROVIDER_SITE_OTHER): Payer: Self-pay | Admitting: Surgery

## 2017-02-02 ENCOUNTER — Ambulatory Visit (INDEPENDENT_AMBULATORY_CARE_PROVIDER_SITE_OTHER): Payer: BC Managed Care – PPO | Admitting: Surgery

## 2017-02-02 VITALS — Ht 69.0 in | Wt 221.0 lb

## 2017-02-02 DIAGNOSIS — M533 Sacrococcygeal disorders, not elsewhere classified: Secondary | ICD-10-CM | POA: Diagnosis not present

## 2017-02-02 DIAGNOSIS — M4326 Fusion of spine, lumbar region: Secondary | ICD-10-CM

## 2017-02-02 DIAGNOSIS — G8929 Other chronic pain: Secondary | ICD-10-CM

## 2017-02-02 MED ORDER — TRAMADOL HCL 50 MG PO TABS: 50.0000 mg | ORAL_TABLET | Freq: Two times a day (BID) | ORAL | 1 refills | Status: DC | PRN

## 2017-02-02 NOTE — Progress Notes (Signed)
Office Visit Note   Patient: Kristen [name]           Date of Birth: [birth year]           MRN: [id number] Visit Date: 02/02/2017              Requested by: [address], [place], [place] [address] PCP: [name], MD   Assessment & Plan: Visit Diagnoses:  1. Fusion of lumbar spine   2. Chronic SI joint pain     Plan: We'll send patient to Dr. Ernestina Patches for bilateral SI joint injections. Follow-up in 3 weeks for recheck. Advised to pay close attention to how she feels immediately after the injections. Given prescription for tramadol  Follow-Up Instructions: Return in about 3 weeks (around 02/23/2017) for Dr Louanne Skye  .   Orders:  Orders Placed This Encounter  Procedures  . XR Lumbar Spine Complete  . Ambulatory referral to Physical Medicine Rehab   Meds ordered this encounter  Medications  . traMADol (ULTRAM) 50 MG tablet    Sig: Take 1 tablet (50 mg total) by mouth every 12 (twelve) hours as needed.    Dispense:  60 tablet    Refill:  1      Procedures: No procedures performed   Clinical Data: No additional findings.   Subjective: Chief Complaint  Patient presents with  . Lower Back - Follow-up    01/2016 TLIF right L3-4, L4-5 pedicle screws and rods L2, L3, L4 and L5    HPI Patient returns for recheck. Status post right L3-4 and L4-5 Tlif.  States that she has been doing well. Complain of pain around the bilateral SI joints. No complaints of radicular pain. Overall she is pleased with her surgical result. Review of Systems No current cardiac pulmonary GI GU issues  Objective: Vital Signs: Ht 5' 9"  (1.753 m)   Wt 221 lb (100.2 kg)   LMP 11/15/2013   BMI 32.64 kg/m   Physical Exam  Constitutional: She is oriented to person, place, and time. She appears well-developed.  HENT:  Head: Normocephalic and atraumatic.  Eyes: EOM are normal. Pupils are equal, round, and reactive to light.  Pulmonary/Chest: No respiratory  distress.  Musculoskeletal:  Gait is somewhat antalgic. Negative logroll bilateral hips. Negative sugars. She is moderate to markedly tender over the bilateral SI joints.  Neurological: She is alert and oriented to person, place, and time.    Ortho Exam  Specialty Comments:  No specialty comments available.  Imaging: Dg Chest 2 View  Result Date: 02/10/2017 CLINICAL DATA:  Mid chest pain, shortness of Breath EXAM: CHEST  2 VIEW COMPARISON:  10/07/2016 FINDINGS: Low lung volumes with bibasilar atelectasis. Mild cardiomegaly. No effusions or overt edema. No acute bony abnormality. IMPRESSION: Low lung volumes, bibasilar atelectasis.  Mild cardiomegaly. Electronically Signed   By: Rolm Baptise M.D.   On: 02/10/2017 09:31     PMFS History: Patient Active Problem List   Diagnosis Date Noted  . 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 Malignant neoplasm of base of tongue ([place]) 10/29/2016  . 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[place]) 10/29/2016  . Headache associated with sexual activity 05/14/2016  . Tongue lesion 05/14/2016  . Loose stools 03/12/2016  . Hypocalcemia 01/22/2016  . Thrombocytopenia ([place]) 01/22/2016  . Chronic diarrhea 01/22/2016  . Chest pain   . Essential hypertension   . Orthostatic hypotension   . Spinal stenosis  of lumbar region 01/20/2016    Class: Chronic  . DDD (degenerative disc disease), lumbar 01/20/2016    Class: Chronic  . Spinal stenosis of lumbar region with neurogenic claudication 01/20/2016  . Low back pain 12/22/2015  . Pre-op examination 12/22/2015  . OSA (obstructive sleep apnea) 10/13/2015  . Muscle cramp 08/01/2015  . GERD (gastroesophageal reflux disease) 07/09/2015  . Excessive daytime sleepiness 06/18/2015  . Shingles 08/28/2013  . Postop check 08/28/2013  . Cholelithiasis 07/10/2013  . Colitis 01/08/2013  . Postoperative anemia 01/08/2013  . Morbid obesity with BMI of 40.0-44.9, adult ([employer]) 01/08/2013   Past  Medical History:  Diagnosis Date  . Allergy   . Anemia    Iron deficiency  . Anxiety   . Arthritis    back, left shoulder  . Chicken pox   . Depression   . Diarrhea    takes Imodium daily  . GERD (gastroesophageal reflux disease)   . H/O hiatal hernia   . Headache(784.0)   . History of kidney stones    1996ish  . History of radiation therapy 11/16/16- 01/01/17   Base of Tongue/ 66 gy in 33 fractions/ Dose: 2 Gy  . Hypertension   . Migraine    none for 5 years (as of 01/13/16)  . OSA (obstructive sleep apnea) 10/13/2015   unable to get cpap, plans to get one in 2018  . Pneumonia   . Restless legs   . Scoliosis   . Shortness of breath    with exertion    Family History  Problem Relation Age of Onset  . Heart disease Father   . Heart attack Father   . Hypertension Mother   . Arthritis Mother   . Diabetes Maternal Grandmother   . Diabetes Paternal Grandmother   . Diabetes Maternal Uncle     Past Surgical History:  Procedure Laterality Date  . BACK SURGERY  07/21/1978  . CHOLECYSTECTOMY N/A 08/15/2013   Procedure: LAPAROSCOPIC CHOLECYSTECTOMY WITH INTRAOPERATIVE CHOLANGIOGRAM;  Surgeon: Gwenyth Ober, MD;  Location: [facility];  Service: General;  Laterality: N/A;  . COLONOSCOPY N/A 01/09/2013   Procedure: COLONOSCOPY;  Surgeon: Beryle Beams, MD;  Location: [facility];  Service: Endoscopy;  Laterality: N/A;  . DIRECT LARYNGOSCOPY  07/2016   Dr. Nicolette Bang [facility]  . GASTROSTOMY TUBE PLACEMENT  09/27/2016  . HERNIA REPAIR Left 1981  . IR PATIENT EVAL TECH 0-60 MINS  12/13/2016  . MODIFIED RADICAL NECK DISSECTION Left 09/27/2016   Levels 1 & 2  . PARTIAL GLOSSECTOMY Left 09/27/2016   Left hemi partial glossectomy  . TONSILLECTOMY    . tracheotomy  09/27/2016  . TUBAL LIGATION  06/1988   Social History   Occupational History  . Occupation: Air cabin crew  Tobacco Use  . Smoking status: Never Smoker  . Smokeless tobacco: Never Used  Substance and Sexual Activity  .  Alcohol use: Yes    Alcohol/week: 4.2 oz    Types: 7 Cans of beer per week    Comment: 1 beer daily  . Drug use: No  . Sexual activity: Yes    Birth control/protection: Surgical

## 2017-02-04 ENCOUNTER — Ambulatory Visit: Payer: BC Managed Care – PPO | Attending: Radiation Oncology

## 2017-02-04 DIAGNOSIS — R1312 Dysphagia, oropharyngeal phase: Secondary | ICD-10-CM | POA: Diagnosis present

## 2017-02-04 NOTE — Therapy (Addendum)
[facility] [address] [place], [place], [address] Phone: [phone]   Fax:  [phone]  Speech Language Pathology Treatment  Patient Details  Name: Kristen [name] MRN: [id number] Date of Birth: [birth year] Referring Provider: Eppie Gibson, MD   Encounter Date: 02/04/2017  End of Session - 02/04/17 1606    Visit Number  3    Number of Visits  7    Date for SLP Re-Evaluation  06/10/17    SLP Start Time  5    SLP Stop Time   1052    SLP Time Calculation (min)  32 min    Activity Tolerance  Patient tolerated treatment well       Past Medical History:  Diagnosis Date  . Allergy   . Anemia    Iron deficiency  . Anxiety   . Arthritis    back, left shoulder  . Chicken pox   . Depression   . Diarrhea    takes Imodium daily  . GERD (gastroesophageal reflux disease)   . H/O hiatal hernia   . Headache(784.0)   . History of kidney stones    1996ish  . History of radiation therapy 11/16/16- 01/01/17   Base of Tongue/ 66 gy in 33 fractions/ Dose: 2 Gy  . Hypertension   . Migraine    none for 5 years (as of 01/13/16)  . OSA (obstructive sleep apnea) 10/13/2015   unable to get cpap, plans to get one in 2018  . Pneumonia   . Restless legs   . Scoliosis   . Shortness of breath    with exertion    Past Surgical History:  Procedure Laterality Date  . BACK SURGERY  07/21/1978  . CHOLECYSTECTOMY N/A 08/15/2013   Procedure: LAPAROSCOPIC CHOLECYSTECTOMY WITH INTRAOPERATIVE CHOLANGIOGRAM;  Surgeon: Gwenyth Ober, MD;  Location: [facility];  Service: General;  Laterality: N/A;  . COLONOSCOPY N/A 01/09/2013   Procedure: COLONOSCOPY;  Surgeon: Beryle Beams, MD;  Location: [facility];  Service: Endoscopy;  Laterality: N/A;  . DIRECT LARYNGOSCOPY  07/2016   Dr. Nicolette Bang [facility]  . GASTROSTOMY TUBE PLACEMENT  09/27/2016  . HERNIA REPAIR Left 1981  . IR PATIENT EVAL TECH 0-60 MINS  12/13/2016  . MODIFIED RADICAL NECK  DISSECTION Left 09/27/2016   Levels 1 & 2  . PARTIAL GLOSSECTOMY Left 09/27/2016   Left hemi partial glossectomy  . TONSILLECTOMY    . tracheotomy  09/27/2016  . TUBAL LIGATION  06/1988    There were no vitals filed for this visit.  Subjective Assessment - 02/04/17 1022    Subjective  Pt had oral (hard palate) sore last week and hard to eat, but recently has not had difficulty.            ADULT SLP TREATMENT - 02/04/17 1023      General Information   Behavior/Cognition  Cooperative;Pleasant mood;Alert      Dysphagia Treatment   Temperature Spikes Noted  No    Oral Cavity - Dentition  Edentulous    Treatment Methods  Skilled observation;Therapeutic exercise    Patient observed directly with PO's  Yes    Type of PO's observed  Dysphagia 3 (soft);Thin liquids    Oral Phase Signs & Symptoms  Prolonged mastication due to endentulous    Pharyngeal Phase Signs & Symptoms  -- none noted    Other treatment/comments  "I had chicken and rice last night." Reports her taste is coming back. With HEP today, pt  reports incr'd compliance (once/day) and SLP cautioned her to cont at least with that frequency. Pt then *told SLP* the rationale for the HEP. Pt completed HEP with modified independence. SLP encouraged pt to eat more POs - small portions.      Pain Assessment   Pain Assessment  No/denies pain      Assessment / Recommendations / Plan   Plan  Continue with current plan of care      Dysphagia Recommendations   Diet recommendations  Dysphagia 2 (fine chop);Dysphagia 3 (mechanical soft);Thin liquid    Liquids provided via  Cup    Medication Administration  -- as tolerated    Supervision  Patient able to self feed    Compensations  Small sips/bites;Slow rate;Follow solids with liquid      Progression Toward Goals   Progression toward goals  Progressing toward goals       SLP Education - 02/04/17 1606    Education provided  Yes    Education Details  eat small portions of more  POs as she can tolerate    Person(s) Educated  Patient    Methods  Explanation    Comprehension  Verbalized understanding       SLP Short Term Goals - 02/04/17 1608      SLP SHORT TERM GOAL #1   Title  pt will complete HEP with rare min A over two sessions    Status  Partially Met one visist      SLP SHORT TERM GOAL #2   Title  pt will tell SLP why she is completing HEP     Status  Achieved      SLP SHORT TERM GOAL #3   Title  pt will tell why a food journal can be helpful in returning to pre-radiation diet    Status  Deferred      SLP SHORT TERM GOAL #4   Title  pt will provide SLP 3 overt s/s aspiration PNA with modified independence    Time  2    Period  -- vistis    Status  On-going       SLP Long Term Goals - 02/04/17 1608      SLP LONG TERM GOAL #1   Title  pt will complete HEP with modified independence over three sessions     Baseline  02-04-17    Time  4    Period  -- visits    Status  On-going      SLP LONG TERM GOAL #2   Title  pt will tell SLP when HEP frequency can be reduced to x2-3/week    Time  4    Period  -- visits    Status  On-going      SLP LONG TERM GOAL #3   Title  pt will tell SLP 3 overt s/s of aspiration PNA with modified independence over three sessions    Time  4    Period  -- visits    Status  On-going      SLP LONG TERM GOAL #4   Title  pt will tell how food journal can assist return to more normal POs    Time  4    Period  Weeks    Status  New       Plan - 02/04/17 1104    Clinical Impression Statement  Pt with oropharyngeal swallowing WFL/WNL today for dys III items (soft solid) and thin liquids today. See "skilled intervention" for  more detail. The probability of swallowing difficulty remains high with the initiation of radiation therapy. Pt will require close watch on overt s/s aspiration PNA. Pt will need to cont to be followed by SLP for regular assessment of accurate HEP completion as well as for safety with POs both  during and following treatment/s.    Speech Therapy Frequency  -- approx once every 4 weeks    Duration  -- 6 visits    Treatment/Interventions  Aspiration precaution training;Pharyngeal strengthening exercises;Diet toleration management by SLP;Trials of upgraded texture/liquids;Internal/external aids;Patient/family education;Compensatory strategies;SLP instruction and feedback;Cueing hierarchy;Environmental controls    Potential to Achieve Goals  Good       Patient will benefit from skilled therapeutic intervention in order to improve the following deficits and impairments:   Dysphagia, oropharyngeal    Problem List Patient Active Problem List   Diagnosis Date Noted  . 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 Malignant neoplasm of base of tongue ([place]) 10/29/2016  . 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[place]) 10/29/2016  . Headache associated with sexual activity 05/14/2016  . Tongue lesion 05/14/2016  . Loose stools 03/12/2016  . Hypocalcemia 01/22/2016  . Thrombocytopenia ([place]) 01/22/2016  . Chronic diarrhea 01/22/2016  . Chest pain   . Essential hypertension   . Orthostatic hypotension   . Spinal stenosis of lumbar region 01/20/2016    Class: Chronic  . DDD (degenerative disc disease), lumbar 01/20/2016    Class: Chronic  . Spinal stenosis of lumbar region with neurogenic claudication 01/20/2016  . Low back pain 12/22/2015  . Pre-op examination 12/22/2015  . OSA (obstructive sleep apnea) 10/13/2015  . Muscle cramp 08/01/2015  . GERD (gastroesophageal reflux disease) 07/09/2015  . Excessive daytime sleepiness 06/18/2015  . Shingles 08/28/2013  . Postop check 08/28/2013  . Cholelithiasis 07/10/2013  . Colitis 01/08/2013  . Postoperative anemia 01/08/2013  . Morbid obesity with BMI of 40.0-44.9, adult ([place]) 01/08/2013    [facility] ,[place], [place]  02/04/2017, 4:09 PM  [facility] [address] [place], [place], [address] Phone: [phone]   Fax:  [phone]   Name: Kristen [name] MRN: [id number] Date of Birth: 01-15-1966

## 2017-02-10 ENCOUNTER — Other Ambulatory Visit: Payer: Self-pay

## 2017-02-10 ENCOUNTER — Encounter (HOSPITAL_COMMUNITY): Payer: Self-pay | Admitting: Emergency Medicine

## 2017-02-10 ENCOUNTER — Inpatient Hospital Stay (HOSPITAL_COMMUNITY)
Admission: EM | Admit: 2017-02-10 | Discharge: 2017-02-13 | DRG: 315 | Disposition: A | Discharge status: Home / Self Care | Payer: BC Managed Care – PPO | Attending: Family Medicine | Admitting: Family Medicine

## 2017-02-10 ENCOUNTER — Emergency Department (HOSPITAL_COMMUNITY): Payer: BC Managed Care – PPO

## 2017-02-10 DIAGNOSIS — F329 Major depressive disorder, single episode, unspecified: Secondary | ICD-10-CM | POA: Diagnosis present

## 2017-02-10 DIAGNOSIS — R402413 Glasgow coma scale score 13-15, at hospital admission: Secondary | ICD-10-CM | POA: Diagnosis present

## 2017-02-10 DIAGNOSIS — K625 Hemorrhage of anus and rectum: Secondary | ICD-10-CM | POA: Diagnosis not present

## 2017-02-10 DIAGNOSIS — D638 Anemia in other chronic diseases classified elsewhere: Secondary | ICD-10-CM | POA: Diagnosis present

## 2017-02-10 DIAGNOSIS — G43909 Migraine, unspecified, not intractable, without status migrainosus: Secondary | ICD-10-CM | POA: Diagnosis present

## 2017-02-10 DIAGNOSIS — E861 Hypovolemia: Secondary | ICD-10-CM | POA: Diagnosis not present

## 2017-02-10 DIAGNOSIS — R197 Diarrhea, unspecified: Secondary | ICD-10-CM | POA: Diagnosis not present

## 2017-02-10 DIAGNOSIS — I10 Essential (primary) hypertension: Secondary | ICD-10-CM | POA: Diagnosis present

## 2017-02-10 DIAGNOSIS — K580 Irritable bowel syndrome with diarrhea: Secondary | ICD-10-CM | POA: Diagnosis present

## 2017-02-10 DIAGNOSIS — I959 Hypotension, unspecified: Secondary | ICD-10-CM | POA: Diagnosis present

## 2017-02-10 DIAGNOSIS — Z9089 Acquired absence of other organs: Secondary | ICD-10-CM | POA: Diagnosis not present

## 2017-02-10 DIAGNOSIS — D61818 Other pancytopenia: Secondary | ICD-10-CM | POA: Diagnosis present

## 2017-02-10 DIAGNOSIS — G2581 Restless legs syndrome: Secondary | ICD-10-CM | POA: Diagnosis present

## 2017-02-10 DIAGNOSIS — Z931 Gastrostomy status: Secondary | ICD-10-CM

## 2017-02-10 DIAGNOSIS — G4733 Obstructive sleep apnea (adult) (pediatric): Secondary | ICD-10-CM | POA: Diagnosis present

## 2017-02-10 DIAGNOSIS — F419 Anxiety disorder, unspecified: Secondary | ICD-10-CM | POA: Diagnosis present

## 2017-02-10 DIAGNOSIS — Z85810 Personal history of malignant neoplasm of tongue: Secondary | ICD-10-CM | POA: Diagnosis not present

## 2017-02-10 DIAGNOSIS — K219 Gastro-esophageal reflux disease without esophagitis: Secondary | ICD-10-CM | POA: Diagnosis present

## 2017-02-10 DIAGNOSIS — Z791 Long term (current) use of non-steroidal anti-inflammatories (NSAID): Secondary | ICD-10-CM

## 2017-02-10 DIAGNOSIS — I9589 Other hypotension: Secondary | ICD-10-CM | POA: Diagnosis not present

## 2017-02-10 DIAGNOSIS — M479 Spondylosis, unspecified: Secondary | ICD-10-CM | POA: Diagnosis present

## 2017-02-10 DIAGNOSIS — Z882 Allergy status to sulfonamides status: Secondary | ICD-10-CM | POA: Diagnosis not present

## 2017-02-10 DIAGNOSIS — M19012 Primary osteoarthritis, left shoulder: Secondary | ICD-10-CM | POA: Diagnosis present

## 2017-02-10 DIAGNOSIS — E860 Dehydration: Secondary | ICD-10-CM | POA: Diagnosis present

## 2017-02-10 DIAGNOSIS — Z79899 Other long term (current) drug therapy: Secondary | ICD-10-CM

## 2017-02-10 DIAGNOSIS — N179 Acute kidney failure, unspecified: Secondary | ICD-10-CM | POA: Diagnosis present

## 2017-02-10 DIAGNOSIS — Z91018 Allergy to other foods: Secondary | ICD-10-CM

## 2017-02-10 DIAGNOSIS — N390 Urinary tract infection, site not specified: Secondary | ICD-10-CM | POA: Diagnosis not present

## 2017-02-10 DIAGNOSIS — N3000 Acute cystitis without hematuria: Secondary | ICD-10-CM | POA: Diagnosis present

## 2017-02-10 DIAGNOSIS — Z7982 Long term (current) use of aspirin: Secondary | ICD-10-CM

## 2017-02-10 DIAGNOSIS — Z923 Personal history of irradiation: Secondary | ICD-10-CM

## 2017-02-10 DIAGNOSIS — K648 Other hemorrhoids: Secondary | ICD-10-CM | POA: Diagnosis present

## 2017-02-10 LAB — CBC WITH DIFFERENTIAL/PLATELET
BASOS PCT: 0 %
Basophils Absolute: 0 10*3/uL (ref 0.0–0.1)
EOS ABS: 0.1 10*3/uL (ref 0.0–0.7)
Eosinophils Relative: 2 %
HCT: 37.4 % (ref 36.0–46.0)
HEMOGLOBIN: 11.7 g/dL — AB (ref 12.0–15.0)
Lymphocytes Relative: 7 %
Lymphs Abs: 0.3 10*3/uL — ABNORMAL LOW (ref 0.7–4.0)
MCH: 26.8 pg (ref 26.0–34.0)
MCHC: 31.3 g/dL (ref 30.0–36.0)
MCV: 85.8 fL (ref 78.0–100.0)
Monocytes Absolute: 0.5 10*3/uL (ref 0.1–1.0)
Monocytes Relative: 12 %
NEUTROS PCT: 79 %
Neutro Abs: 3.2 10*3/uL (ref 1.7–7.7)
Platelets: 140 10*3/uL — ABNORMAL LOW (ref 150–400)
RBC: 4.36 MIL/uL (ref 3.87–5.11)
RDW: 15.1 % (ref 11.5–15.5)
WBC: 4.1 10*3/uL (ref 4.0–10.5)

## 2017-02-10 LAB — I-STAT TROPONIN, ED: Troponin i, poc: 0 ng/mL (ref 0.00–0.08)

## 2017-02-10 LAB — URINALYSIS, ROUTINE W REFLEX MICROSCOPIC
Bilirubin Urine: NEGATIVE
GLUCOSE, UA: NEGATIVE mg/dL
Hgb urine dipstick: NEGATIVE
Ketones, ur: NEGATIVE mg/dL
NITRITE: NEGATIVE
PH: 5 (ref 5.0–8.0)
PROTEIN: NEGATIVE mg/dL
Specific Gravity, Urine: 1.009 (ref 1.005–1.030)

## 2017-02-10 LAB — RAPID URINE DRUG SCREEN, HOSP PERFORMED
Amphetamines: NOT DETECTED
BARBITURATES: NOT DETECTED
Benzodiazepines: NOT DETECTED
COCAINE: NOT DETECTED
Opiates: NOT DETECTED
TETRAHYDROCANNABINOL: NOT DETECTED

## 2017-02-10 LAB — BASIC METABOLIC PANEL
ANION GAP: 8 (ref 5–15)
BUN: 32 mg/dL — ABNORMAL HIGH (ref 6–20)
CALCIUM: 9.6 mg/dL (ref 8.9–10.3)
CO2: 29 mmol/L (ref 22–32)
CREATININE: 1.23 mg/dL — AB (ref 0.44–1.00)
Chloride: 103 mmol/L (ref 101–111)
GFR calc non Af Amer: 50 mL/min — ABNORMAL LOW (ref >60)
GFR, EST AFRICAN AMERICAN: 58 mL/min — AB (ref >60)
Glucose, Bld: 126 mg/dL — ABNORMAL HIGH (ref 65–99)
Potassium: 4.4 mmol/L (ref 3.5–5.1)
SODIUM: 140 mmol/L (ref 135–145)

## 2017-02-10 LAB — HEMOGLOBIN AND HEMATOCRIT, BLOOD
HEMATOCRIT: 36.2 % (ref 36.0–46.0)
HEMOGLOBIN: 10.9 g/dL — AB (ref 12.0–15.0)

## 2017-02-10 LAB — I-STAT CG4 LACTIC ACID, ED
LACTIC ACID, VENOUS: 0.54 mmol/L (ref 0.5–1.9)
LACTIC ACID, VENOUS: 0.38 mmol/L — AB (ref 0.5–1.9)

## 2017-02-10 LAB — ETHANOL

## 2017-02-10 LAB — POC OCCULT BLOOD, ED: Fecal Occult Bld: POSITIVE — AB

## 2017-02-10 LAB — GLUCOSE, CAPILLARY: Glucose-Capillary: 72 mg/dL (ref 65–99)

## 2017-02-10 LAB — MRSA PCR SCREENING: MRSA BY PCR: NEGATIVE

## 2017-02-10 MED ORDER — ONDANSETRON HCL 4 MG PO TABS
4.0000 mg | ORAL_TABLET | Freq: Four times a day (QID) | ORAL | Status: DC | PRN
  Filled 2017-02-10: qty 1

## 2017-02-10 MED ORDER — SODIUM CHLORIDE 0.9 % IV BOLUS (SEPSIS)
1000.0000 mL | Freq: Once | INTRAVENOUS | Status: AC
  Administered 2017-02-10: 1000 mL via INTRAVENOUS

## 2017-02-10 MED ORDER — GABAPENTIN 300 MG PO CAPS
300.0000 mg | ORAL_CAPSULE | Freq: Three times a day (TID) | ORAL | Status: DC
  Administered 2017-02-10 – 2017-02-13 (×8): 300 mg via ORAL
  Filled 2017-02-10 (×8): qty 1

## 2017-02-10 MED ORDER — SODIUM CHLORIDE 0.9 % IV SOLN
INTRAVENOUS | Status: DC
  Administered 2017-02-10: 18:00:00 via INTRAVENOUS

## 2017-02-10 MED ORDER — DEXTROSE 5 % IV SOLN
1.0000 g | INTRAVENOUS | Status: DC
  Administered 2017-02-10 – 2017-02-12 (×3): 1 g via INTRAVENOUS
  Filled 2017-02-10 (×4): qty 10

## 2017-02-10 MED ORDER — HYDROCODONE-ACETAMINOPHEN 5-325 MG PO TABS
1.0000 | ORAL_TABLET | Freq: Four times a day (QID) | ORAL | Status: DC | PRN
  Administered 2017-02-10 – 2017-02-13 (×7): 2 via ORAL
  Filled 2017-02-10 (×7): qty 2

## 2017-02-10 MED ORDER — ONDANSETRON HCL 4 MG/2ML IJ SOLN
4.0000 mg | Freq: Four times a day (QID) | INTRAMUSCULAR | Status: DC | PRN
  Administered 2017-02-10: 4 mg via INTRAVENOUS
  Filled 2017-02-10: qty 2

## 2017-02-10 MED ORDER — DEXTROSE 5 % IV SOLN: 1.0000 g | INTRAVENOUS | Status: DC

## 2017-02-10 MED ORDER — PHENOL 1.4 % MT LIQD
1.0000 | OROMUCOSAL | Status: DC | PRN
  Administered 2017-02-11: 1 via OROMUCOSAL
  Filled 2017-02-10 (×2): qty 177

## 2017-02-10 MED ORDER — ELUXADOLINE 100 MG PO TABS
100.0000 mg | ORAL_TABLET | Freq: Two times a day (BID) | ORAL | Status: DC
  Administered 2017-02-11 – 2017-02-13 (×5): 100 mg via ORAL
  Filled 2017-02-10: qty 1

## 2017-02-10 MED ORDER — DEXTROSE-NACL 5-0.9 % IV SOLN
INTRAVENOUS | Status: DC
  Administered 2017-02-10 – 2017-02-11 (×2): via INTRAVENOUS

## 2017-02-10 MED ORDER — PANTOPRAZOLE SODIUM 40 MG PO TBEC: 40.0000 mg | DELAYED_RELEASE_TABLET | Freq: Every day | ORAL | Status: DC

## 2017-02-10 MED ORDER — ACETAMINOPHEN 650 MG RE SUPP: 650.0000 mg | Freq: Four times a day (QID) | RECTAL | Status: DC | PRN

## 2017-02-10 MED ORDER — BACLOFEN 10 MG PO TABS
10.0000 mg | ORAL_TABLET | Freq: Two times a day (BID) | ORAL | Status: DC
  Administered 2017-02-10 – 2017-02-13 (×6): 10 mg via ORAL
  Filled 2017-02-10 (×6): qty 1

## 2017-02-10 MED ORDER — ACETAMINOPHEN 325 MG PO TABS: 650.0000 mg | ORAL_TABLET | Freq: Four times a day (QID) | ORAL | Status: DC | PRN

## 2017-02-10 NOTE — Progress Notes (Signed)
Pharmacy: ceftriaxone  Patient's a 52 y.o F presented to the ED on 02/10/17 with c/o rectal pain. UA showed large leukocytes and many bacteria.  To start ceftriaxone for UTI.  Plan: - Ceftriaxone 1 gm IV q24h - No renal adjustment is needed with Ceftriaxone -- pharmacy will sign off - Re-consult [place] if need further assistance  Thank you for asking pharmacy to be part of this patient's care.  Dia Sitter, PharmD, BCPS 02/10/2017 2:59 PM

## 2017-02-10 NOTE — ED Notes (Signed)
ED Provider at bedside. 

## 2017-02-10 NOTE — ED Notes (Signed)
Enteric precautions initiated.

## 2017-02-10 NOTE — ED Notes (Signed)
Bed: [facility]PT3 Expected date:  Expected time:  Means of arrival:  Comments: 

## 2017-02-10 NOTE — ED Triage Notes (Signed)
Pt BIB EMS from home with multiple complaints. Patient main complaint is of rectal pain and per patient, "a little" BRB from rectum that started this morning. Patient also complaining of chronic sciatica pain.

## 2017-02-10 NOTE — ED Notes (Signed)
Patient placed on purwick to obtain urine sample.

## 2017-02-10 NOTE — ED Provider Notes (Signed)
[facility] DEPT Provider Note   CSN: [id number] Arrival date & time: 02/10/17  0702     History   Chief Complaint Chief Complaint  Patient presents with  . Rectal Bleeding  . Rectal Pain    HPI Kristen [name] is a 52 y.o. female.  HPI   Patient is a 52 year old female with a history of squamous cell carcinoma of the base of the tongue, IBS, OSA, GERD, depression, and anxiety presenting for an episode of bright red blood noticed on a streak of toilet paper, as well as lightheadedness, and bilateral hip pain.  Patient presents per EMS.  Patient reports that she was on the toilet this morning and had an episode of diarrhea when she noticed the bright red blood per rectum.  Blood was only noticed on the toilet paper.  Patient also reports that while she was on the toilet, she felt bilateral legs have decreased sensation.  Patient reports she also felt lightheaded and as if she were going to pass out while on the toilet.  Patient takes 2 antihypertensives, however has not taken them since last night.  Subsequently, patient reports she has been sleepy and having difficulty staying awake.  Patient denies any prior history of rectal bleeding.  Patient reports that she does have some rectal discomfort.  Patient denies fever, chills, or abdominal pain.  No dysuriaPatient reports that she also has had some increased shortness of breath this morning.  No chest pain.  Patient reports that she has had some tremoring yesterday and today, mostly affecting bilateral legs.  Patient does report that she was on antibiotics for urinary tract infection within the last month.  Past Medical History:  Diagnosis Date  . Allergy   . Anemia    Iron deficiency  . Anxiety   . Arthritis    back, left shoulder  . Chicken pox   . Depression   . Diarrhea    takes Imodium daily  . GERD (gastroesophageal reflux disease)   . H/O hiatal hernia   . Headache(784.0)   . History of  kidney stones    1996ish  . History of radiation therapy 11/16/16- 01/01/17   Base of Tongue/ 66 gy in 33 fractions/ Dose: 2 Gy  . Hypertension   . Migraine    none for 5 years (as of 01/13/16)  . OSA (obstructive sleep apnea) 10/13/2015   unable to get cpap, plans to get one in 2018  . Pneumonia   . Restless legs   . Scoliosis   . Shortness of breath    with exertion    Patient Active Problem List   Diagnosis Date Noted  . 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 Malignant neoplasm of base of tongue ([place]) 10/29/2016  . 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[place]) 10/29/2016  . Headache associated with sexual activity 05/14/2016  . Tongue lesion 05/14/2016  . Loose stools 03/12/2016  . Hypocalcemia 01/22/2016  . Thrombocytopenia ([place]) 01/22/2016  . Chronic diarrhea 01/22/2016  . Chest pain   . Essential hypertension   . Orthostatic hypotension   . Spinal stenosis of lumbar region 01/20/2016    Class: Chronic  . DDD (degenerative disc disease), lumbar 01/20/2016    Class: Chronic  . Spinal stenosis of lumbar region with neurogenic claudication 01/20/2016  . Low back pain 12/22/2015  . Pre-op examination 12/22/2015  . OSA (obstructive sleep apnea) 10/13/2015  . Muscle cramp 08/01/2015  . GERD (gastroesophageal  reflux disease) 07/09/2015  . Excessive daytime sleepiness 06/18/2015  . Shingles 08/28/2013  . Postop check 08/28/2013  . Cholelithiasis 07/10/2013  . Colitis 01/08/2013  . Postoperative anemia 01/08/2013  . Morbid obesity with BMI of 40.0-44.9, adult ([place]) 01/08/2013    Past Surgical History:  Procedure Laterality Date  . BACK SURGERY  07/21/1978  . CHOLECYSTECTOMY N/A 08/15/2013   Procedure: LAPAROSCOPIC CHOLECYSTECTOMY WITH INTRAOPERATIVE CHOLANGIOGRAM;  Surgeon: Gwenyth Ober, MD;  Location: [facility];  Service: General;  Laterality: N/A;  . COLONOSCOPY N/A 01/09/2013   Procedure: COLONOSCOPY;  Surgeon: Beryle Beams, MD;  Location: [facility];  Service: Endoscopy;  Laterality: N/A;  . DIRECT LARYNGOSCOPY  07/2016   Dr. Nicolette Bang [facility]  . GASTROSTOMY TUBE PLACEMENT  09/27/2016  . HERNIA REPAIR Left 1981  . IR PATIENT EVAL TECH 0-60 MINS  12/13/2016  . MODIFIED RADICAL NECK DISSECTION Left 09/27/2016   Levels 1 & 2  . PARTIAL GLOSSECTOMY Left 09/27/2016   Left hemi partial glossectomy  . TONSILLECTOMY    . tracheotomy  09/27/2016  . TUBAL LIGATION  06/1988    OB History    No data available       Home Medications    Prior to Admission medications   Medication Sig Start Date End Date Taking? Authorizing Provider  amLODipine (NORVASC) 10 MG tablet Take 1 tablet (10 mg total) daily by mouth. 11/30/16   [name], MD  aspirin 81 MG chewable tablet 81 mg. 10/05/16   [provider]  baclofen (LIORESAL) 10 MG tablet Take 1 tablet (10 mg total) 2 (two) times daily by mouth. 11/22/16 02/20/17  Jessy Oto, MD  calcium carbonate (TUMS EX) 750 MG chewable tablet Chew 750 mg by mouth. 10/04/16   [provider]  cephALEXin (KEFLEX) 500 MG capsule Take 1 capsule (500 mg total) by mouth 2 (two) times daily. 01/15/17   Dorie Rank, MD  chlorhexidine (PERIDEX) 0.12 % solution  10/04/16   [provider]  diclofenac (VOLTAREN) 75 MG EC tablet TAKE ONE TABLET BY MOUTH TWICE DAILY WITH FOOD AS NEEDED FOR PAIN 11/22/16   Jessy Oto, MD  Diclofenac Sodium 1.5 % SOLN  09/01/16   [provider]  Doxylamine-DM (VICKS [place]) 6.25-15 MG/15ML LIQD Take 50m before bedtime for cold/cough. 01/14/17   SEppie Gibson MD  Eluxadoline (VIBERZI) 100 MG TABS Take 1 tablet (100 mg total) 2 (two) times daily by mouth. 11/30/16   BBillie Ruddy MD  gabapentin (NEURONTIN) 300 MG capsule Take 1 capsule by mouth 3 times daily. 01/12/17   XLeandrew Koyanagi MD  HYDROcodone-acetaminophen (NORCO/VICODIN) 5-325 MG tablet Take 1-2 tablets by mouth every 4 (four) hours as needed for moderate pain. Taper as pain  improves. 01/14/17   SEppie Gibson MD  indomethacin (INDOCIN) 25 MG capsule Take 1 capsule by mouth 30-60 minutes prior to sexual activity. 05/14/16   CGolden Circle FNP  lidocaine (XYLOCAINE) 2 % solution Mix 1 part 2%viscous lidocaine,1part H2O.Swish and/or swallow 145mof this mixture,305mbefore meals and at bedtime, up to QID 11/22/16   SquEppie GibsonD  lidocaine (XYLOCAINE) 5 % ointment Apply topically.    [provider]  lisinopril (PRINIVIL,ZESTRIL) 40 MG tablet Take 1 tablet (40 mg total) daily by mouth. 11/30/16   BanBillie RuddyD  loperamide (IMODIUM) 2 MG capsule Take 2-4 mg by mouth 2 (two) times daily as needed for diarrhea or loose stools.     [provider]  LORazepam (ATIVAN) 0.5 MG tablet Take 1 tab PRN nausea. Do not take more than 3 pills daily. 12/31/16   Eppie Gibson, MD  nystatin (MYCOSTATIN) 100000 UNIT/ML suspension Take by mouth. 10/04/16   [provider]  pantoprazole (PROTONIX) 40 MG tablet Take 1 tablet (40 mg total) daily by mouth. 11/30/16   [name], MD  Phenylephrine-Guaifenesin 7.5-100 MG/5ML LIQD Take 22m Q 6 hrs PRN congestion and cough during daytime only. 01/14/17   SEppie Gibson MD  Potassium 99 MG TABS 1 tablet by Gastrostomy Tube route daily. 11/30/16   BBillie Ruddy MD  SUMAtriptan (IMITREX) 100 MG tablet Take 0.5-1 tablet by mouth at the onset of migraine and may repeat in 2 hours if headache persists or recurs. 05/14/16   CGolden Circle FNP  tiZANidine (ZANAFLEX) 4 MG tablet TAKE 1 TABLET BY MOUTH EVERY 6 HOURS AS NEEDED 05/12/16   NJessy Oto MD  traMADol (ULTRAM) 50 MG tablet Take 1 tablet (50 mg total) by mouth every 6 (six) hours as needed. 01/15/17   KDorie Rank MD  traMADol (ULTRAM) 50 MG tablet Take 1 tablet (50 mg total) by mouth every 12 (twelve) hours as needed. 02/02/17   OLanae Crumbly PA-C    Family History Family History  Problem Relation Age of Onset  . Heart disease Father   .  Heart attack Father   . Hypertension Mother   . Arthritis Mother   . Diabetes Maternal Grandmother   . Diabetes Paternal Grandmother   . Diabetes Maternal Uncle     Social History Social History   Tobacco Use  . Smoking status: Never Smoker  . Smokeless tobacco: Never Used  Substance Use Topics  . Alcohol use: Yes    Alcohol/week: 4.2 oz    Types: 7 Cans of beer per week    Comment: 1 beer daily  . Drug use: No     Allergies   Other and Sulfur   Review of Systems Review of Systems  Constitutional: Negative for chills and fever.  HENT: Negative for congestion and rhinorrhea.   Respiratory: Positive for shortness of breath. Negative for cough and chest tightness.   Cardiovascular: Negative for chest pain, palpitations and leg swelling.  Gastrointestinal: Positive for anal bleeding and diarrhea. Negative for abdominal pain, nausea and vomiting.  Genitourinary: Negative for dysuria and flank pain.  Musculoskeletal: Positive for arthralgias and back pain. Negative for myalgias.       Bilateral hip and sacral pain.  Skin: Negative for rash.  Neurological: Positive for dizziness and light-headedness. Negative for syncope and headaches.  All other systems reviewed and are negative.    Physical Exam Updated Vital Signs BP 98/66 (BP Location: Right Arm)   Pulse 71   Temp 97.9 F (36.6 C) (Oral)   Resp 18   LMP 11/15/2013   SpO2 95%   Physical Exam  Constitutional: She appears well-developed and well-nourished. No distress.  Well-appearing female.  Appears slightly sleepy.  HENT:  Head: Normocephalic and atraumatic.  Mouth/Throat: Oropharynx is clear and moist.  Eyes: Conjunctivae and EOM are normal. Pupils are equal, round, and reactive to light.  Neck: Normal range of motion. Neck supple.  Cardiovascular: Normal rate, regular rhythm, S1 normal and S2 normal.  No murmur heard. Pulmonary/Chest: Effort normal and breath sounds normal. She has no wheezes. She has no  rales.  Abdominal: Soft. She exhibits no distension. There is no tenderness. There is no guarding.  Genitourinary:  Genitourinary Comments: Exam performed with nurse chaperone present.  Patient exhibits a prolapsed internal hemorrhoid at approximately the 12 o'clock position in the left parietal decubitus position.  There is no induration or fluctuance surrounding the rectum.  Normal rectal tone.  No masses palpated in rectum.  There is a small amount of bleeding from this prolapsed internal hemorrhoid.  There is brown stool in rectal vault.  Musculoskeletal: Normal range of motion. She exhibits no edema or deformity.  Spine Exam: Inspection/Palpation: Patient exhibits left greater than right paraspinal lumbar muscular tenderness. Strength: 4+/5 throughout LE bilaterally (hip flexion/extension, adduction/abduction; knee flexion/extension; foot dorsiflexion/plantarflexion, inversion/eversion; great toe inversion) Sensation: Intact to light touch in proximal and distal LE bilaterally Reflexes: 2+ quadriceps and achilles reflexes  Lymphadenopathy:    She has no cervical adenopathy.  Neurological: She is alert.  Patient moves extremities symmetrically and with good coordination. Cranial nerves grossly intact.  Skin: Skin is warm and dry. No rash noted. No erythema.  Psychiatric: She has a normal mood and affect. Her behavior is normal. Judgment and thought content normal.  Nursing note and vitals reviewed.    ED Treatments / Results  Labs (all labs ordered are listed, but only abnormal results are displayed) Labs Reviewed  CBC WITH DIFFERENTIAL/PLATELET - Abnormal; Notable for the following components:      Result Value   Hemoglobin 11.7 (*)    Platelets 140 (*)    Lymphs Abs 0.3 (*)    All other components within normal limits  POC OCCULT BLOOD, ED - Abnormal; Notable for the following components:   Fecal Occult Bld POSITIVE (*)    All other components within normal limits  BASIC  METABOLIC PANEL  URINALYSIS, ROUTINE W REFLEX MICROSCOPIC  RAPID URINE DRUG SCREEN, HOSP PERFORMED  ETHANOL  I-STAT TROPONIN, ED    EKG  EKG Interpretation None       Radiology No results found.  Procedures Procedures (including critical care time)  Medications Ordered in ED Medications  sodium chloride 0.9 % bolus 1,000 mL (1,000 mLs Intravenous New Bag/Given 02/10/17 0803)     Initial Impression / Assessment and Plan / ED Course  I have reviewed the triage vital signs and the nursing notes.  Pertinent labs & imaging results that were available during my care of the patient were reviewed by me and considered in my medical decision making (see chart for details).     Final Clinical Impressions(s) / ED Diagnoses   Final diagnoses:  Rectal bleeding  Acute cystitis without hematuria  Hypotension, unspecified hypotension type   Patient is nontoxic-appearing, afebrile, no acute distress.  Patient does appear slightly sleepy.  Blood pressures have been between 88 systolic to 98 systolic during emergency department course.  On chart review, patient does have well-controlled hypertension with systolic blood pressures around 100.  Mean arterial pressure is 67 at this time.  Differential diagnosis includes vasovagal episode, volume depletion.  Prolapsed internal hemorrhoid likely cause of patient's small amount of rectal bleeding.  Do not suspect large GI bleed based on examination and history.  Additionally, hemoglobin is 11.7 and stable compared to patient's prior readings.  Case discussed with Dr. Gareth Morgan, who independently evaluated the patient.  Will initiate 2 L of normal saline for soft blood pressures, as well as CBC, BMP, troponin, EKG, chest x-ray, urinalysis, UDS, and alcohol level.  No leukocytosis.  Patient does demonstrate slight AK I with BUN of 32, and creatinine of 1.23, which is above baseline around 1.0.  Will initiate third liter, as patient continues to  be hypotensive.  Will require catheterized urine.  Will collect stool for C. difficile, as patient has recently been on antibiotics within the last month.  1:33 PM Orthostatic vital signs reviewed, which demonstrate no evidence of orthostasis at this time.  Hypotension is now been resolved for several hours.  2:48 PM Patient reevaluated.  Patient feels weak on her feet with ambulation.  Additionally, blood pressures now persistently in 25P systolic.  Will contact hospital medicine for admission for hypotension, AK I, and urinary tract infection.  Spoke with Dr. Rockne Menghini of hospital medicine, who will admit the patient.  ED Discharge Orders    None       Tamala Julian 02/10/17 1641    Gareth Morgan, MD 02/11/17 [phone]

## 2017-02-10 NOTE — H&P (Addendum)
TRH H&P    Patient Demographics:    Kristen [name], is a 52 y.o. female  MRN: [id number]  DOB - [birth year]  Admit Date - 02/10/2017  Referring MD/NP/PA: Valere Dross  Outpatient Primary MD for the patient is [name], MD  Patient coming from: home  CC -generalized weakness   HPI:    Kristen [name]  is a 52 y.o. female, with history of squamous cell carcinoma of the base of tongue, status post partial gastrectomy,  Radiation treatments, IBS, GERD, depression came to hospital with generalized weakness. Patient also noticed bright red blood on the street of toilet paper. Patient said that she was on the toilet this morning when she had an episode of diarrhea and she noticed bright red blood per rectum. Patient also has felt lightheaded but did not pass out. She has noticed that his blood pressure has been low over the past one week. She does take antihypertensive medications but no change of medications has been made in her regimen. She denies dysuria, urgency or frequency of urination. Denies abdominal pain. No chest pain or shortness of breath. In the ED, rectal examination revealed  Prolapsed internal hemorrhoids. Patient found to have abnormal UA and started on ceftriaxone. Also stool for C. diff ordered as patient complained of diarrhea.    Review of systems:      All other systems reviewed and are negative.   With Past History of the following :    Past Medical History:  Diagnosis Date  . Allergy   . Anemia    Iron deficiency  . Anxiety   . Arthritis    back, left shoulder  . Chicken pox   . Depression   . Diarrhea    takes Imodium daily  . GERD (gastroesophageal reflux disease)   . H/O hiatal hernia   . Headache(784.0)   . History of kidney stones    1996ish  . History of radiation therapy 11/16/16- 01/01/17   Base of Tongue/ 66 gy in 33 fractions/ Dose: 2 Gy  . Hypertension     . Migraine    none for 5 years (as of 01/13/16)  . OSA (obstructive sleep apnea) 10/13/2015   unable to get cpap, plans to get one in 2018  . Pneumonia   . Restless legs   . Scoliosis   . Shortness of breath    with exertion      Past Surgical History:  Procedure Laterality Date  . BACK SURGERY  07/21/1978  . CHOLECYSTECTOMY N/A 08/15/2013   Procedure: LAPAROSCOPIC CHOLECYSTECTOMY WITH INTRAOPERATIVE CHOLANGIOGRAM;  Surgeon: Gwenyth Ober, MD;  Location: [facility];  Service: General;  Laterality: N/A;  . COLONOSCOPY N/A 01/09/2013   Procedure: COLONOSCOPY;  Surgeon: Beryle Beams, MD;  Location: [facility];  Service: Endoscopy;  Laterality: N/A;  . DIRECT LARYNGOSCOPY  07/2016   Dr. Nicolette Bang [facility]  . GASTROSTOMY TUBE PLACEMENT  09/27/2016  . HERNIA REPAIR Left 1981  . IR PATIENT EVAL TECH 0-60 MINS  12/13/2016  . MODIFIED RADICAL NECK DISSECTION Left 09/27/2016  Levels 1 & 2  . PARTIAL GLOSSECTOMY Left 09/27/2016   Left hemi partial glossectomy  . TONSILLECTOMY    . tracheotomy  09/27/2016  . TUBAL LIGATION  06/1988      Social History:      Social History   Tobacco Use  . Smoking status: Never Smoker  . Smokeless tobacco: Never Used  Substance Use Topics  . Alcohol use: Yes    Alcohol/week: 4.2 oz    Types: 7 Cans of beer per week    Comment: 1 beer daily       Family History :     Family History  Problem Relation Age of Onset  . Heart disease Father   . Heart attack Father   . Hypertension Mother   . Arthritis Mother   . Diabetes Maternal Grandmother   . Diabetes Paternal Grandmother   . Diabetes Maternal Uncle    appears in no acute distress   Home Medications:   Prior to Admission medications   Medication Sig Start Date End Date Taking? Authorizing Provider  amLODipine (NORVASC) 10 MG tablet Take 1 tablet (10 mg total) daily by mouth. 11/30/16  Yes [name], MD  aspirin 81 MG chewable tablet 81 mg. 10/05/16  Yes [provider]   baclofen (LIORESAL) 10 MG tablet Take 1 tablet (10 mg total) 2 (two) times daily by mouth. 11/22/16 02/20/17 Yes Jessy Oto, MD  diclofenac (VOLTAREN) 75 MG EC tablet TAKE ONE TABLET BY MOUTH TWICE DAILY WITH FOOD AS NEEDED FOR PAIN 11/22/16  Yes Jessy Oto, MD  Eluxadoline (VIBERZI) 100 MG TABS Take 1 tablet (100 mg total) 2 (two) times daily by mouth. 11/30/16  Yes [name], MD  gabapentin (NEURONTIN) 300 MG capsule Take 1 capsule by mouth 3 times daily. 01/12/17  Yes Leandrew Koyanagi, MD  HYDROcodone-acetaminophen (NORCO/VICODIN) 5-325 MG tablet Take 1-2 tablets by mouth every 4 (four) hours as needed for moderate pain. Taper as pain improves. 01/14/17  Yes Eppie Gibson, MD  indomethacin (INDOCIN) 25 MG capsule Take 1 capsule by mouth 30-60 minutes prior to sexual activity. 05/14/16  Yes Golden Circle, FNP  lisinopril (PRINIVIL,ZESTRIL) 40 MG tablet Take 1 tablet (40 mg total) daily by mouth. 11/30/16  Yes [name], MD  pantoprazole (PROTONIX) 40 MG tablet Take 1 tablet (40 mg total) daily by mouth. 11/30/16  Yes [name], MD  Potassium 99 MG TABS 1 tablet by Gastrostomy Tube route daily. 11/30/16  Yes [name], MD  traMADol (ULTRAM) 50 MG tablet Take 1 tablet (50 mg total) by mouth every 12 (twelve) hours as needed. 02/02/17  Yes Lanae Crumbly, PA-C  cephALEXin (KEFLEX) 500 MG capsule Take 1 capsule (500 mg total) by mouth 2 (two) times daily. Patient not taking: Reported on 02/10/2017 01/15/17   Dorie Rank, MD  Doxylamine-DM [facility] [place]) 6.25-15 MG/15ML LIQD Take 58m before bedtime for cold/cough. Patient not taking: Reported on 02/10/2017 01/14/17   SEppie Gibson MD  lidocaine (XYLOCAINE) 2 % solution Mix 1 part 2%viscous lidocaine,1part H2O.Swish and/or swallow 176mof this mixture,3026mbefore meals and at bedtime, up to QID Patient not taking: Reported on 02/10/2017 11/22/16   SquEppie GibsonD  LORazepam (ATIVAN) 0.5 MG tablet Take 1 tab PRN  nausea. Do not take more than 3 pills daily. Patient not taking: Reported on 02/10/2017 12/31/16   SquEppie GibsonD  Phenylephrine-Guaifenesin 7.5-100 MG/5ML LIQD Take 8m71m6 hrs PRN congestion  and cough during daytime only. Patient not taking: Reported on 02/10/2017 01/14/17   Eppie Gibson, MD  SUMAtriptan (IMITREX) 100 MG tablet Take 0.5-1 tablet by mouth at the onset of migraine and may repeat in 2 hours if headache persists or recurs. Patient not taking: Reported on 02/10/2017 05/14/16   Golden Circle, FNP  tiZANidine (ZANAFLEX) 4 MG tablet TAKE 1 TABLET BY MOUTH EVERY 6 HOURS AS NEEDED Patient not taking: Reported on 02/10/2017 05/12/16   Jessy Oto, MD  traMADol (ULTRAM) 50 MG tablet Take 1 tablet (50 mg total) by mouth every 6 (six) hours as needed. Patient not taking: Reported on 02/10/2017 01/15/17   Dorie Rank, MD     Allergies:     Allergies  Allergen Reactions  . Other Hives and Swelling    tomato  . Sulfur Hives and Swelling     Physical Exam:   Vitals  Blood pressure 108/86, pulse (!) 59, temperature 97.9 F (36.6 C), temperature source Oral, resp. rate 16, last menstrual period 11/15/2013, SpO2 92 %.  1.  General: patient appears in no acute distress  2. Psychiatric:  Intact judgement and  insight, awake alert, oriented x 3.  3. Neurologic: No focal neurological deficits, all cranial nerves intact.Strength 5/5 all 4 extremities, sensation intact all 4 extremities, plantars down going.  4. Eyes :  anicteric sclerae, moist conjunctivae with no lid lag. PERRLA.  5. ENMT:  Oropharynx clear with moist mucous membranes and good dentition  6. Neck:  supple, no cervical lymphadenopathy appriciated, No thyromegaly  7. Respiratory : Normal respiratory effort, good air movement bilaterally,clear to  auscultation bilaterally  8. Cardiovascular : RRR, no gallops, rubs or murmurs, no leg edema  9. Gastrointestinal:  Positive bowel sounds, abdomen soft,  non-tender to palpation,no hepatosplenomegaly, no rigidity or guarding       10. Skin:  No cyanosis, normal texture and turgor, no rash, lesions or ulcers  11.Musculoskeletal:  Good muscle tone,  joints appear normal , no effusions,  normal range of motion    Data Review:    CBC Recent Labs  Lab 02/10/17 0759  WBC 4.1  HGB 11.7*  HCT 37.4  PLT 140*  MCV 85.8  MCH 26.8  MCHC 31.3  RDW 15.1  LYMPHSABS 0.3*  MONOABS 0.5  EOSABS 0.1  BASOSABS 0.0   ------------------------------------------------------------------------------------------------------------------  Chemistries  Recent Labs  Lab 02/10/17 0759  NA 140  K 4.4  CL 103  CO2 29  GLUCOSE 126*  BUN 32*  CREATININE 1.23*  CALCIUM 9.6   ------------------------------------------------------------------------------------------------------------------  --------------------------- --------------------------------------------------------------------------------------------------------------- Urine analysis:    Component Value Date/Time   COLORURINE AMBER (A) 02/10/2017 0749   APPEARANCEUR CLEAR 02/10/2017 0749   LABSPEC 1.009 02/10/2017 0749   PHURINE 5.0 02/10/2017 0749   GLUCOSEU NEGATIVE 02/10/2017 0749   HGBUR NEGATIVE 02/10/2017 [place] 02/10/2017 [place] 02/10/2017 0749   PROTEINUR NEGATIVE 02/10/2017 0749   UROBILINOGEN 1.0 01/08/2013 0912   NITRITE NEGATIVE 02/10/2017 0749   LEUKOCYTESUR LARGE (A) 02/10/2017 0749      Imaging Results:    Dg Chest 2 View  Result Date: 02/10/2017 CLINICAL DATA:  Mid chest pain, shortness of Breath EXAM: CHEST  2 VIEW COMPARISON:  10/07/2016 FINDINGS: Low lung volumes with bibasilar atelectasis. Mild cardiomegaly. No effusions or overt edema. No acute bony abnormality. IMPRESSION: Low lung volumes, bibasilar atelectasis.  Mild cardiomegaly. Electronically Signed   By: Rolm Baptise M.D.   On: 02/10/2017  09:31    My personal  review of EKG: Rhythm-normal sinus rhythm   Assessment & Plan:    Active Problems:   Hypotension   1. Hypotension-patient does not appear to be septic, lactic acid 0.54. Patient received IV fluid boluses in the ED. Hypertension likely from diarrhea and dehydration. Continue IV normal saline at hundred ML per hour. Monitor patient closely and step down unit. 2. Diarrhea- stool for C. diff ordered, continue enteric precautions, IV fluids. 3. UTI-patient has mildly abnormal UA, started on IV ceftriaxone.  will obtain urine culture. 4. Squamous cell carcinoma of the tongue- stable, status post radiation and partial glossectomy. 5. Rectal bleeding-likely from hemorrhoids, patient found to have internal hemorrhoids on rectal examination in the ED done by ED physician will check H&H Q8 hours. FOBT is positive 6. Hypertension-hold antihypertensive medications due to hypertension. 7. Acute kidney injury-creatinine is 1.23, baseline creatinine 1.04 as of 12/ 29/18. Started on IV normal saline. Follow BMP in am.   DVT Prophylaxis-    SCDs   AM Labs Ordered, also please review Full Orders  Family Communication: Admission, patients condition and plan of care including tests being ordered have been discussed with the patient  who indicate understanding and agree with the plan and Code Status.  Code Status: full code  Admission status: inpatient  Time spent in minutes : 60 min   Oswald Hillock M.D on 02/10/2017 at 3:59 PM  Between 7am to 7pm - Pager - [phone]. After 7pm go to [email or web address] - password [facility]  [facility] - Office  [phone]

## 2017-02-10 NOTE — ED Notes (Signed)
This RN went with patient to x-ray.

## 2017-02-10 NOTE — ED Notes (Signed)
Attempted to call report x1. Will attempt again in 10 minutes.

## 2017-02-11 DIAGNOSIS — N390 Urinary tract infection, site not specified: Secondary | ICD-10-CM

## 2017-02-11 DIAGNOSIS — R197 Diarrhea, unspecified: Secondary | ICD-10-CM

## 2017-02-11 LAB — COMPREHENSIVE METABOLIC PANEL
ALK PHOS: 82 U/L (ref 38–126)
ALT: 19 U/L (ref 14–54)
ANION GAP: 6 (ref 5–15)
AST: 22 U/L (ref 15–41)
Albumin: 3.3 g/dL — ABNORMAL LOW (ref 3.5–5.0)
BILIRUBIN TOTAL: 0.6 mg/dL (ref 0.3–1.2)
BUN: 20 mg/dL (ref 6–20)
CALCIUM: 8.9 mg/dL (ref 8.9–10.3)
CO2: 25 mmol/L (ref 22–32)
Chloride: 109 mmol/L (ref 101–111)
Creatinine, Ser: 0.84 mg/dL (ref 0.44–1.00)
GFR calc non Af Amer: >60 mL/min (ref >60)
Glucose, Bld: 99 mg/dL (ref 65–99)
POTASSIUM: 4.4 mmol/L (ref 3.5–5.1)
Sodium: 140 mmol/L (ref 135–145)
TOTAL PROTEIN: 6.5 g/dL (ref 6.5–8.1)

## 2017-02-11 LAB — CBC
HEMATOCRIT: 35.4 % — AB (ref 36.0–46.0)
HEMOGLOBIN: 11.1 g/dL — AB (ref 12.0–15.0)
MCH: 27.1 pg (ref 26.0–34.0)
MCHC: 31.4 g/dL (ref 30.0–36.0)
MCV: 86.3 fL (ref 78.0–100.0)
Platelets: 140 10*3/uL — ABNORMAL LOW (ref 150–400)
RBC: 4.1 MIL/uL (ref 3.87–5.11)
RDW: 15.4 % (ref 11.5–15.5)
WBC: 2.6 10*3/uL — ABNORMAL LOW (ref 4.0–10.5)

## 2017-02-11 LAB — GLUCOSE, CAPILLARY: GLUCOSE-CAPILLARY: 91 mg/dL (ref 65–99)

## 2017-02-11 LAB — HIV ANTIBODY (ROUTINE TESTING W REFLEX): HIV Screen 4th Generation wRfx: NONREACTIVE

## 2017-02-11 LAB — HEMOGLOBIN AND HEMATOCRIT, BLOOD
HEMATOCRIT: 33.1 % — AB (ref 36.0–46.0)
HEMOGLOBIN: 10.3 g/dL — AB (ref 12.0–15.0)

## 2017-02-11 MED ORDER — OSMOLITE 1.5 CAL PO LIQD
237.0000 mL | Freq: Three times a day (TID) | ORAL | Status: DC
  Administered 2017-02-11 – 2017-02-13 (×6): 237 mL
  Filled 2017-02-11 (×7): qty 237

## 2017-02-11 MED ORDER — PANTOPRAZOLE SODIUM 40 MG PO PACK
40.0000 mg | PACK | Freq: Every day | ORAL | Status: DC
  Administered 2017-02-11 – 2017-02-13 (×3): 40 mg
  Filled 2017-02-11 (×3): qty 20

## 2017-02-11 MED ORDER — FREE WATER
100.0000 mL | Freq: Three times a day (TID) | Status: DC
  Administered 2017-02-11 – 2017-02-13 (×6): 100 mL

## 2017-02-11 NOTE — Progress Notes (Signed)
[facility]  PROGRESS NOTE  Kristen [name] ZVJ:[id number] DOB: July 03, 1965 DOA: 02/10/2017 PCP: [name], MD   Brief HPI:    52 y.o. female, with history of squamous cell carcinoma of the base of tongue, status post partial gastrectomy,  Radiation treatments, IBS, GERD, depression came to hospital with generalized weakness. Patient also noticed bright red blood on the street of toilet paper. Patient said that she was on the toilet this morning when she had an episode of diarrhea and she noticed bright red blood per rectum. Patient also has felt lightheaded but did not pass out.     Subjective   Patient seen and examined, denies diarrhea. BP has improved.   Assessment/Plan:      1. Hypotension- resolved, patient does not appear to be septic, lactic acid 0.54. Patient received IV fluid boluses in the ED. Hypotension likely from diarrhea and dehydration.  2. Diarrhea-resolved,  stool for C. diff is negative ordered, but not obtained yet as patient did not have loose stool in the hospital  3. UTI-patient had mildly abnormal UA, started on IV ceftriaxone.  Urine culture is pending. 4. Squamous cell carcinoma of the tongue- stable, status post radiation and partial glossectomy. Patient has peg tube  In place, will start Osmolite 1 can  TID via tube. 5. Rectal bleeding-likely from hemorrhoids, patient found to have internal hemorrhoids on rectal examination in the ED done by ED physician will check H&H Q8 hours. FOBT is positive. 6. Anemia of chronic disease- hemoglobin is stable at 10.3, at baseline. 7. Hypertension-hold antihypertensive medications due to hypertension. Acute kidney injury-creatinine was 1.23 on admission, improved with IV fluids, today creatinine is 0.84     DVT prophylaxis: SCD  Code Status: Full code  Family Communication: No family at bedside  Disposition Plan: Home in 1-2 days   Consultants: None   Procedures:  None   Continuous  infusions . sodium chloride 10 mL/hr at 02/11/17 0808  . cefTRIAXone (ROCEPHIN)  IV 1 g (02/10/17 1555)  . dextrose 5 % and 0.9% NaCl 100 mL/hr at 02/11/17 0800      Antibiotics:   Anti-infectives (From admission, onward)   Start     Dose/Rate Route Frequency Ordered Stop   02/10/17 1530  cefTRIAXone (ROCEPHIN) 1 g in dextrose 5 % 50 mL IVPB     1 g 100 mL/hr over 30 Minutes Intravenous Every 24 hours 02/10/17 1500     02/10/17 1515  cefTRIAXone (ROCEPHIN) 1 g in dextrose 5 % 50 mL IVPB  Status:  Discontinued     1 g 100 mL/hr over 30 Minutes Intravenous Every 24 hours 02/10/17 1500 02/10/17 1500       Objective   Vitals:   02/11/17 0400 02/11/17 0500 02/11/17 0600 02/11/17 0809  BP: 108/74 100/67 95/62   Pulse:      Resp: 11 10 11    Temp:    98.3 F (36.8 C)  TempSrc:    Oral  SpO2:        Intake/Output Summary (Last 24 hours) at 02/11/2017 1018 Last data filed at 02/11/2017 0800 Gross per 24 hour  Intake 1471.66 ml  Output 500 ml  Net 971.66 ml   There were no vitals filed for this visit.   Physical Examination:   Physical Exam: Eyes: No icterus, extraocular muscles intact  Mouth: Oral mucosa is moist, no lesions on palate,  Neck: Supple, no deformities, masses, or tenderness Lungs: Normal respiratory effort, bilateral clear to auscultation, no  crackles or wheezes.  Heart: Regular rate and rhythm, S1 and S2 normal, no murmurs, rubs auscultated Abdomen: BS normoactive,soft,nondistended,non-tender to palpation,no organomegaly Extremities: No pretibial edema, no erythema, no cyanosis, no clubbing Neuro : Alert and oriented to time, place and person, No focal deficits Skin: No rashes seen on exam    Data Reviewed: I have personally reviewed following labs and imaging studies  CBG: Recent Labs  Lab 02/10/17 1938  GLUCAP 72    CBC: Recent Labs  Lab 02/10/17 0759 02/10/17 1725 02/11/17 0035 02/11/17 0822  WBC 4.1  --  2.6*  --   NEUTROABS 3.2  --    --   --   HGB 11.7* 10.9* 11.1* 10.3*  HCT 37.4 36.2 35.4* 33.1*  MCV 85.8  --  86.3  --   PLT 140*  --  140*  --     Basic Metabolic Panel: Recent Labs  Lab 02/10/17 0759 02/11/17 0035  NA 140 140  K 4.4 4.4  CL 103 109  CO2 29 25  GLUCOSE 126* 99  BUN 32* 20  CREATININE 1.23* 0.84  CALCIUM 9.6 8.9    Recent Results (from the past 240 hour(s))  MRSA PCR Screening     Status: None   Collection Time: 02/10/17  5:19 PM  Result Value Ref Range Status   MRSA by PCR NEGATIVE NEGATIVE Final    Comment:        The GeneXpert MRSA Assay (FDA approved for NASAL specimens only), is one component of a comprehensive MRSA colonization surveillance program. It is not intended to diagnose MRSA infection nor to guide or monitor treatment for MRSA infections.      Liver Function Tests: Recent Labs  Lab 02/11/17 0035  AST 22  ALT 19  ALKPHOS 82  BILITOT 0.6  PROT 6.5  ALBUMIN 3.3*      Studies: Dg Chest 2 View  Result Date: 02/10/2017 CLINICAL DATA:  Mid chest pain, shortness of Breath EXAM: CHEST  2 VIEW COMPARISON:  10/07/2016 FINDINGS: Low lung volumes with bibasilar atelectasis. Mild cardiomegaly. No effusions or overt edema. No acute bony abnormality. IMPRESSION: Low lung volumes, bibasilar atelectasis.  Mild cardiomegaly. Electronically Signed   By: Rolm Baptise M.D.   On: 02/10/2017 09:31    Scheduled Meds: . baclofen  10 mg Oral BID  . Eluxadoline  100 mg Oral BID AC  . feeding supplement (OSMOLITE 1.5 CAL)  237 mL Per Tube Q8H  . free water  100 mL Per Tube Q8H  . gabapentin  300 mg Oral TID  . pantoprazole sodium  40 mg Per Tube Daily      Time spent: 25 min  [facility] Pager [phone]. If 7PM-7AM, please contact night-coverage at [email or web address], Office  [phone]  password [email or web address]  02/11/2017, 10:18 AM  LOS: 1 day

## 2017-02-11 NOTE — Progress Notes (Signed)
Patient to transfer to 3W 1333 report given to receiving nurse, all questions answered at this time.  Pt. VSS with no s/s of distress noted.  Patient stable for transfer.

## 2017-02-12 LAB — CBC
HEMATOCRIT: 32 % — AB (ref 36.0–46.0)
Hemoglobin: 10.3 g/dL — ABNORMAL LOW (ref 12.0–15.0)
MCH: 27.1 pg (ref 26.0–34.0)
MCHC: 32.2 g/dL (ref 30.0–36.0)
MCV: 84.2 fL (ref 78.0–100.0)
Platelets: 141 10*3/uL — ABNORMAL LOW (ref 150–400)
RBC: 3.8 MIL/uL — ABNORMAL LOW (ref 3.87–5.11)
RDW: 15 % (ref 11.5–15.5)
WBC: 2.8 10*3/uL — ABNORMAL LOW (ref 4.0–10.5)

## 2017-02-12 LAB — BASIC METABOLIC PANEL
ANION GAP: 7 (ref 5–15)
BUN: 13 mg/dL (ref 6–20)
CALCIUM: 9.2 mg/dL (ref 8.9–10.3)
CO2: 27 mmol/L (ref 22–32)
CREATININE: 0.65 mg/dL (ref 0.44–1.00)
Chloride: 106 mmol/L (ref 101–111)
GFR calc Af Amer: >60 mL/min (ref >60)
GFR calc non Af Amer: >60 mL/min (ref >60)
GLUCOSE: 122 mg/dL — AB (ref 65–99)
Potassium: 3.8 mmol/L (ref 3.5–5.1)
Sodium: 140 mmol/L (ref 135–145)

## 2017-02-12 NOTE — Progress Notes (Signed)
[facility]  PROGRESS NOTE  Kristen [name] JGG:[id number] DOB: 31-Jan-1965 DOA: 02/10/2017 PCP: [name], MD   Brief HPI:    52 y.o. female, with history of squamous cell carcinoma of the base of tongue, status post partial gastrectomy,  Radiation treatments, IBS, GERD, depression came to hospital with generalized weakness. Patient also noticed bright red blood on the street of toilet paper. Patient said that she was on the toilet this morning when she had an episode of diarrhea and she noticed bright red blood per rectum. Patient also has felt lightheaded but did not pass out.     Subjective   Patient seen and examined, denies any complaints. Diarrhea has resolved.   Assessment/Plan:      1. Hypotension- resolved, patient does not appear to be septic, lactic acid 0.54. Patient received IV fluid boluses in the ED. Hypotension likely from diarrhea and dehydration.  2. Diarrhea-resolved,  stool for C. diff is negative ordered, but not obtained yet as patient did not have loose stool in the hospital  3. UTI-patient had  abnormal UA, urine culture is growing greater than 100,000 colonies of E. coli started on IV ceftriaxone. Follow final culture results 4. Squamous cell carcinoma of the tongue- stable, status post radiation and partial glossectomy. Patient has peg tube  In place, continue Osmolite 1 can  TID via tube. 5. Rectal bleeding-likely from hemorrhoids, patient found to have internal hemorrhoids on rectal examination in the ED done by ED physician will check H&H Q8 hours. FOBT is positive. 6. Anemia of chronic disease- hemoglobin is stable at 10.3, at baseline. 7. Hypertension-hold antihypertensive medications due to hypertension. 8. Acute kidney injury-creatinine was 1.23 on admission, improved with IV fluids, today creatinine is 0.65     DVT prophylaxis: SCD  Code Status: Full code  Family Communication: No family at bedside  Disposition Plan: Home in 1-2  days   Consultants: None   Procedures:  None   Continuous infusions . sodium chloride 10 mL/hr at 02/11/17 1600  . cefTRIAXone (ROCEPHIN)  IV Stopped (02/11/17 1606)  . dextrose 5 % and 0.9% NaCl Stopped (02/11/17 5465)      Antibiotics:   Anti-infectives (From admission, onward)   Start     Dose/Rate Route Frequency Ordered Stop   02/10/17 1530  cefTRIAXone (ROCEPHIN) 1 g in dextrose 5 % 50 mL IVPB     1 g 100 mL/hr over 30 Minutes Intravenous Every 24 hours 02/10/17 1500     02/10/17 1515  cefTRIAXone (ROCEPHIN) 1 g in dextrose 5 % 50 mL IVPB  Status:  Discontinued     1 g 100 mL/hr over 30 Minutes Intravenous Every 24 hours 02/10/17 1500 02/10/17 1500       Objective   Vitals:   02/11/17 1500 02/11/17 1600 02/11/17 2227 02/12/17 0435  BP: 97/68 101/72 125/88 105/72  Pulse:   82 60  Resp: 11 13 15 13   Temp:   98.2 F (36.8 C) 97.9 F (36.6 C)  TempSrc:   Oral Oral  SpO2: 96% 97% 98% 98%  Weight:      Height:        Intake/Output Summary (Last 24 hours) at 02/12/2017 1214 Last data filed at 02/12/2017 0436 Gross per 24 hour  Intake 847 ml  Output -  Net 847 ml   Filed Weights   02/11/17 1412  Weight: 101.2 kg (223 lb 1.7 oz)     Physical Examination:   Physical Exam: Eyes: No icterus,  extraocular muscles intact  Mouth: Oral mucosa is moist, no lesions on palate,  Neck: Supple, no deformities, masses, or tenderness Lungs: Normal respiratory effort, bilateral clear to auscultation, no crackles or wheezes.  Heart: Regular rate and rhythm, S1 and S2 normal, no murmurs, rubs auscultated Abdomen: BS normoactive,soft,nondistended,non-tender to palpation,no organomegaly Extremities: No pretibial edema, no erythema, no cyanosis, no clubbing Neuro : Alert and oriented to time, place and person, No focal deficits Skin: No rashes seen on exam   Data Reviewed: I have personally reviewed following labs and imaging studies  CBG: Recent Labs  Lab  02/10/17 1938 02/11/17 1735  GLUCAP 72 91    CBC: Recent Labs  Lab 02/10/17 0759 02/10/17 1725 02/11/17 0035 02/11/17 0822 02/12/17 0801  WBC 4.1  --  2.6*  --  2.8*  NEUTROABS 3.2  --   --   --   --   HGB 11.7* 10.9* 11.1* 10.3* 10.3*  HCT 37.4 36.2 35.4* 33.1* 32.0*  MCV 85.8  --  86.3  --  84.2  PLT 140*  --  140*  --  141*    Basic Metabolic Panel: Recent Labs  Lab 02/10/17 0759 02/11/17 0035 02/12/17 0801  NA 140 140 140  K 4.4 4.4 3.8  CL 103 109 106  CO2 29 25 27   GLUCOSE 126* 99 122*  BUN 32* 20 13  CREATININE 1.23* 0.84 0.65  CALCIUM 9.6 8.9 9.2    Recent Results (from the past 240 hour(s))  Culture, Urine     Status: Abnormal (Preliminary result)   Collection Time: 02/10/17  4:28 PM  Result Value Ref Range Status   Specimen Description URINE, RANDOM  Final   Special Requests NONE  Final   Culture (A)  Final    >=100,000 COLONIES/mL ESCHERICHIA COLI SUSCEPTIBILITIES TO FOLLOW Performed at [facility], [address], [place], [place] [address]    Report Status PENDING  Incomplete  MRSA PCR Screening     Status: None   Collection Time: 02/10/17  5:19 PM  Result Value Ref Range Status   MRSA by PCR NEGATIVE NEGATIVE Final    Comment:        The GeneXpert MRSA Assay (FDA approved for NASAL specimens only), is one component of a comprehensive MRSA colonization surveillance program. It is not intended to diagnose MRSA infection nor to guide or monitor treatment for MRSA infections.      Liver Function Tests: Recent Labs  Lab 02/11/17 0035  AST 22  ALT 19  ALKPHOS 82  BILITOT 0.6  PROT 6.5  ALBUMIN 3.3*      Studies: No results found.  Scheduled Meds: . baclofen  10 mg Oral BID  . Eluxadoline  100 mg Oral BID AC  . feeding supplement (OSMOLITE 1.5 CAL)  237 mL Per Tube Q8H  . free water  100 mL Per Tube Q8H  . gabapentin  300 mg Oral TID  . pantoprazole sodium  40 mg Per Tube Daily      Time spent: 25  min  [facility] Pager [phone]. If 7PM-7AM, please contact night-coverage at [email or web address], Office  [phone]  password [email or web address]  02/12/2017, 12:14 PM  LOS: 2 days

## 2017-02-13 DIAGNOSIS — I10 Essential (primary) hypertension: Secondary | ICD-10-CM

## 2017-02-13 LAB — BASIC METABOLIC PANEL
Anion gap: 6 (ref 5–15)
BUN: 11 mg/dL (ref 6–20)
CHLORIDE: 108 mmol/L (ref 101–111)
CO2: 27 mmol/L (ref 22–32)
CREATININE: 0.62 mg/dL (ref 0.44–1.00)
Calcium: 8.8 mg/dL — ABNORMAL LOW (ref 8.9–10.3)
GFR calc Af Amer: >60 mL/min (ref >60)
GFR calc non Af Amer: >60 mL/min (ref >60)
GLUCOSE: 89 mg/dL (ref 65–99)
Potassium: 3.8 mmol/L (ref 3.5–5.1)
SODIUM: 141 mmol/L (ref 135–145)

## 2017-02-13 LAB — CBC
HCT: 32.4 % — ABNORMAL LOW (ref 36.0–46.0)
HEMOGLOBIN: 10.3 g/dL — AB (ref 12.0–15.0)
MCH: 26.8 pg (ref 26.0–34.0)
MCHC: 31.8 g/dL (ref 30.0–36.0)
MCV: 84.4 fL (ref 78.0–100.0)
PLATELETS: 127 10*3/uL — AB (ref 150–400)
RBC: 3.84 MIL/uL — ABNORMAL LOW (ref 3.87–5.11)
RDW: 14.9 % (ref 11.5–15.5)
WBC: 2.6 10*3/uL — ABNORMAL LOW (ref 4.0–10.5)

## 2017-02-13 LAB — URINE CULTURE: Culture: >=100000 — AB

## 2017-02-13 MED ORDER — CEPHALEXIN 500 MG PO CAPS: 500.0000 mg | ORAL_CAPSULE | Freq: Two times a day (BID) | ORAL | 0 refills | Status: DC

## 2017-02-13 NOTE — Discharge Summary (Signed)
Physician Discharge Summary  Kristen [name] STM:[id number] DOB: [birth year] DOA: 02/10/2017  PCP: [name], MD  Admit date: 02/10/2017 Discharge date: 02/13/2017  Time spent: 35* minutes  Recommendations for Outpatient Follow-up:  1. Follow up PCP in  2 weeks 2. Check cbc in one week   Discharge Diagnoses:  Active Problems:   Hypotension   Discharge Condition: Stable  Diet recommendation: heart healthy diet  Filed Weights   02/11/17 1412  Weight: 101.2 kg (223 lb 1.7 oz)    History of present illness:  52 y.o.female,with history of squamous cell carcinoma of the base of tongue, status postpartial gastrectomy, Radiation treatments, IBS, GERD, depression came to hospital with generalized weakness. Patient also noticed bright red blood on the street of toilet paper.Patient said that she was on the toilet this morning when she had an episode of diarrhea and she noticed bright red blood per rectum. Patient also has felt lightheaded but did not pass out.    Hospital Course:  1. Hypotension- resolved, patient does not appear to be septic,lactic acid 0.54.Patient received IV fluid boluses in the ED. Hypotension likely from diarrhea and dehydration. 2. Diarrhea-resolved, stool for C. diff is negative ordered, but not obtained  as patient did not have loose stool in the hospital  3. UTI-patient had  abnormal UA, urine culture is growing greater than 100,000 colonies of E. coli  sensitibe to  Ceftriaxone and cefazolin. Will discharge home on Po Keflex 500 mg po bid x 5 days. 4. Squamous cell carcinoma of the tongue-stable, status post radiation and partialglossectomy. Patient has peg tube  In place, continue Osmolite 1 can  TID via tube. 5. Rectal bleeding-likely from hemorrhoids, patient found to have internal hemorrhoids on rectal examination in the ED done by ED physician.FOBT is positive. Hemoglobin is stable at 10.3 6. Anemia of chronic disease- hemoglobin is stable  at 10.3, at baseline. 7. Hypertension- will discontinue lisinopril, continue Norvasc. 8. Acute kidney injury-creatinine was 1.23 on admission,improved with IV fluids, today creatinine is 0.62. 9. Mild pancytopenia- check cbc as outpatient  in one week      Procedures:  None   Consultations:  None   Discharge Exam: Vitals:   02/12/17 2045 02/13/17 0300  BP: 121/82 113/83  Pulse: 80 70  Resp: 16 16  Temp: 98.4 F (36.9 C)   SpO2:  97%    General: Appears in no acute distress Cardiovascular: S1S2 RRR Respiratory: Clear bilaterally  Discharge Instructions    Allergies as of 02/13/2017      Reactions   Other Hives, Swelling   tomato   Sulfur Hives, Swelling      Medication List    STOP taking these medications   lisinopril 40 MG tablet Commonly known as:  PRINIVIL,ZESTRIL     TAKE these medications   amLODipine 10 MG tablet Commonly known as:  NORVASC Take 1 tablet (10 mg total) daily by mouth.   aspirin 81 MG chewable tablet 81 mg.   baclofen 10 MG tablet Commonly known as:  LIORESAL Take 1 tablet (10 mg total) 2 (two) times daily by mouth.   cephALEXin 500 MG capsule Commonly known as:  KEFLEX Take 1 capsule (500 mg total) by mouth 2 (two) times daily.   diclofenac 75 MG EC tablet Commonly known as:  VOLTAREN TAKE ONE TABLET BY MOUTH TWICE DAILY WITH FOOD AS NEEDED FOR PAIN   Doxylamine-DM 6.25-15 MG/15ML Liqd Commonly known as:  VICKS NYQUIL COUGH Take 58m before bedtime for  cold/cough.   Eluxadoline 100 MG Tabs Commonly known as:  VIBERZI Take 1 tablet (100 mg total) 2 (two) times daily by mouth.   gabapentin 300 MG capsule Commonly known as:  NEURONTIN Take 1 capsule by mouth 3 times daily.   HYDROcodone-acetaminophen 5-325 MG tablet Commonly known as:  NORCO/VICODIN Take 1-2 tablets by mouth every 4 (four) hours as needed for moderate pain. Taper as pain improves.   indomethacin 25 MG capsule Commonly known as:  INDOCIN Take 1  capsule by mouth 30-60 minutes prior to sexual activity.   lidocaine 2 % solution Commonly known as:  XYLOCAINE Mix 1 part 2%viscous lidocaine,1part H2O.Swish and/or swallow 59m of this mixture,354m before meals and at bedtime, up to QID   LORazepam 0.5 MG tablet Commonly known as:  ATIVAN Take 1 tab PRN nausea. Do not take more than 3 pills daily.   pantoprazole 40 MG tablet Commonly known as:  PROTONIX Take 1 tablet (40 mg total) daily by mouth.   Phenylephrine-Guaifenesin 7.5-100 MG/5ML Liqd Take 1030m 6 hrs PRN congestion and cough during daytime only.   Potassium 99 MG Tabs 1 tablet by Gastrostomy Tube route daily.   SUMAtriptan 100 MG tablet Commonly known as:  IMITREX Take 0.5-1 tablet by mouth at the onset of migraine and may repeat in 2 hours if headache persists or recurs.   tiZANidine 4 MG tablet Commonly known as:  ZANAFLEX TAKE 1 TABLET BY MOUTH EVERY 6 HOURS AS NEEDED   traMADol 50 MG tablet Commonly known as:  ULTRAM Take 1 tablet (50 mg total) by mouth every 6 (six) hours as needed. What changed:  Another medication with the same name was removed. Continue taking this medication, and follow the directions you see here.      Allergies  Allergen Reactions  . Other Hives and Swelling    tomato  . Sulfur Hives and Swelling      The results of significant diagnostics from this hospitalization (including imaging, microbiology, ancillary and laboratory) are listed below for reference.    Significant Diagnostic Studies: Dg Chest 2 View  Result Date: 02/10/2017 CLINICAL DATA:  Mid chest pain, shortness of Breath EXAM: CHEST  2 VIEW COMPARISON:  10/07/2016 FINDINGS: Low lung volumes with bibasilar atelectasis. Mild cardiomegaly. No effusions or overt edema. No acute bony abnormality. IMPRESSION: Low lung volumes, bibasilar atelectasis.  Mild cardiomegaly. Electronically Signed   By: KevRolm BaptiseD.   On: 02/10/2017 09:31   Dg Lumbar Spine  Complete  Result Date: 01/15/2017 CLINICAL DATA:  Right buttock pain, no known injury, initial encounter EXAM: LUMBAR SPINE - COMPLETE 4+ VIEW COMPARISON:  10/11/2016 FINDINGS: Changes of prior fusion are noted at L3-4 and L4-5 with posterior fixation. A fixation rod is noted extending from the thoracic spine inferiorly this which is stable in appearance. No distal fixation is noted. Scoliosis concave to the right is seen. No acute hardware failure is noted. IMPRESSION: Chronic changes and postsurgical changes without acute abnormality. Electronically Signed   By: MarInez CatalinaD.   On: 01/15/2017 21:47   Dg Abdomen 1 View  Result Date: 01/15/2017 CLINICAL DATA:  Feeding tube placement. EXAM: ABDOMEN - 1 VIEW COMPARISON:  None. FINDINGS: A feeding tube was injected with contrast material. The tube is in good position in the gastric body. No contrast extravasation is demonstrated. IMPRESSION: Good position of the gastrostomy feeding tube without complicating features. Electronically Signed   By: P. Marijo SanesD.   On: 01/15/2017 22:43  Microbiology: Recent Results (from the past 240 hour(s))  Culture, Urine     Status: Abnormal   Collection Time: 02/10/17  4:28 PM  Result Value Ref Range Status   Specimen Description URINE, RANDOM  Final   Special Requests NONE  Final   Culture >=100,000 COLONIES/mL ESCHERICHIA COLI (A)  Final   Report Status 02/13/2017 FINAL  Final   Organism ID, Bacteria ESCHERICHIA COLI (A)  Final      Susceptibility   Escherichia coli - MIC*    AMPICILLIN >=32 RESISTANT Resistant     CEFAZOLIN <=4 SENSITIVE Sensitive     CEFTRIAXONE <=1 SENSITIVE Sensitive     CIPROFLOXACIN <=0.25 SENSITIVE Sensitive     GENTAMICIN <=1 SENSITIVE Sensitive     IMIPENEM <=0.25 SENSITIVE Sensitive     NITROFURANTOIN <=16 SENSITIVE Sensitive     TRIMETH/SULFA <=20 SENSITIVE Sensitive     AMPICILLIN/SULBACTAM 16 INTERMEDIATE Intermediate     PIP/TAZO <=4 SENSITIVE Sensitive      Extended ESBL NEGATIVE Sensitive     * >=100,000 COLONIES/mL ESCHERICHIA COLI  MRSA PCR Screening     Status: None   Collection Time: 02/10/17  5:19 PM  Result Value Ref Range Status   MRSA by PCR NEGATIVE NEGATIVE Final    Comment:        The GeneXpert MRSA Assay (FDA approved for NASAL specimens only), is one component of a comprehensive MRSA colonization surveillance program. It is not intended to diagnose MRSA infection nor to guide or monitor treatment for MRSA infections.      Labs: Basic Metabolic Panel: Recent Labs  Lab 02/10/17 0759 02/11/17 0035 02/12/17 0801 02/13/17 0332  NA 140 140 140 141  K 4.4 4.4 3.8 3.8  CL 103 109 106 108  CO2 29 25 27 27   GLUCOSE 126* 99 122* 89  BUN 32* 20 13 11   CREATININE 1.23* 0.84 0.65 0.62  CALCIUM 9.6 8.9 9.2 8.8*   Liver Function Tests: Recent Labs  Lab 02/11/17 0035  AST 22  ALT 19  ALKPHOS 82  BILITOT 0.6  PROT 6.5  ALBUMIN 3.3*   No results for input(s): LIPASE, AMYLASE in the last 168 hours. No results for input(s): AMMONIA in the last 168 hours. CBC: Recent Labs  Lab 02/10/17 0759 02/10/17 1725 02/11/17 0035 02/11/17 0822 02/12/17 0801 02/13/17 0332  WBC 4.1  --  2.6*  --  2.8* 2.6*  NEUTROABS 3.2  --   --   --   --   --   HGB 11.7* 10.9* 11.1* 10.3* 10.3* 10.3*  HCT 37.4 36.2 35.4* 33.1* 32.0* 32.4*  MCV 85.8  --  86.3  --  84.2 84.4  PLT 140*  --  140*  --  141* 127*    CBG: Recent Labs  Lab 02/10/17 1938 02/11/17 1735  GLUCAP 72 91       Signed:  Oswald Hillock MD.  [facility] 02/13/2017, 10:23 AM

## 2017-02-13 NOTE — Progress Notes (Signed)
Pt discharged to home. DC instructions given. No concerns voiced. Pt encouraged to stop by pharmacy and pick up med that was e-prescribed. Voiced understanding. Home med retrieved from pharmacy and given to pt. Original copy of home med sheet was not found in pt's chart; therefore copy of pharmacy's copy made, signed by pt and placed in chart. Pt left unit in stable condition. Picked up from main entrance by mother.  [name].

## 2017-02-21 ENCOUNTER — Ambulatory Visit (INDEPENDENT_AMBULATORY_CARE_PROVIDER_SITE_OTHER): Payer: BC Managed Care – PPO | Admitting: Physical Medicine and Rehabilitation

## 2017-02-21 ENCOUNTER — Ambulatory Visit (INDEPENDENT_AMBULATORY_CARE_PROVIDER_SITE_OTHER): Payer: BC Managed Care – PPO

## 2017-02-21 ENCOUNTER — Encounter (INDEPENDENT_AMBULATORY_CARE_PROVIDER_SITE_OTHER): Payer: Self-pay | Admitting: Physical Medicine and Rehabilitation

## 2017-02-21 DIAGNOSIS — M461 Sacroiliitis, not elsewhere classified: Secondary | ICD-10-CM

## 2017-02-21 NOTE — Progress Notes (Signed)
Kristen [name] - 52 y.o. female MRN [id number]  Date of birth: 03-08-1965  Office Visit Note: Visit Date: 02/21/2017 PCP: [name], MD Referred by: [name], MD  Subjective: Chief Complaint  Patient presents with  . Right Hip - Pain   HPI: Kristen [name] is a 52 year old female with chronic low back and chronic pain syndrome with history of lumbar surgery.  She recently saw Benjiman Core for follow-up.  At that time he reported bilateral low back pain and felt like her sacroiliac joints may be a source of pain.  Today she is telling me is predominantly the right side without much on the left at all.  He does point over the PSIS and consistent Fortin finger sign.  On exam she does have some pain with external rotation of the right hip compared to left.  We will today complete a right sided sacroiliac joint injection with fluoroscopic guidance.  She will follow-up with Dr. Louanne Skye.    ROS Otherwise per HPI.  Assessment & Plan: Visit Diagnoses:  1. Sacroiliitis, not elsewhere classified [facility])     Plan: Findings:  Right sacroiliac joint injection with fluoroscopic guidance.  Partial arthrogram was obtained with no extravasation of fluid other than the surrounding ligaments.  There does appear to be some sclerosis of the right sacroiliac joint.    Meds & Orders: No orders of the defined types were placed in this encounter.   Orders Placed This Encounter  Procedures  . Sacroiliac Joint Inj  . XR C-ARM NO REPORT    Follow-up: Return for Dr. Louanne Skye, Benjiman Core. PA-C.   Procedures: Sacroiliac Joint Inj on 02/21/2017 2:39 PM Indications: pain and diagnostic evaluation Details: 22 G 3.5 in needle, fluoroscopy-guided posterior approach Medications: 2 mL bupivacaine 0.5 %; 80 mg methylPREDNISolone acetate 80 MG/ML Outcome: tolerated well, no immediate complications  There was excellent flow of contrast producing a partial arthrogram of the sacroiliac joint.  Procedure,  treatment alternatives, risks and benefits explained, specific risks discussed. Consent was given by the patient. Immediately prior to procedure a time out was called to verify the correct patient, procedure, equipment, support staff and site/side marked as required. Patient was prepped and draped in the usual sterile fashion.      No notes on file   Clinical History: No specialty comments available.  She reports that  has never smoked. she has never used smokeless tobacco. No results for input(s): HGBA1C, LABURIC in the last 8760 hours.  Objective:  VS:  HT:    WT:   BMI:     BP:   HR: bpm  TEMP: ( )  RESP:  Physical Exam  Musculoskeletal:  Back pain over the right PSIS with positive Fortin finger sign and equivocal Patrick's on the right as well.    Ortho Exam Imaging: Xr C-arm No Report  Result Date: 02/21/2017 Please see Notes or Procedures tab for imaging impression.   Past Medical/Family/Surgical/Social History: Medications & Allergies reviewed per EMR Patient Active Problem List   Diagnosis Date Noted  . Hypotension 02/10/2017  . 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 Malignant neoplasm of base of tongue ([place]) 10/29/2016  . 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[place]) 10/29/2016  . Headache associated with sexual activity 05/14/2016  . Tongue lesion 05/14/2016  . Loose stools 03/12/2016  . Hypocalcemia 01/22/2016  . Thrombocytopenia ([place]) 01/22/2016  . Chronic diarrhea 01/22/2016  . Chest pain   . Essential hypertension   .  Orthostatic hypotension   . Spinal stenosis of lumbar region 01/20/2016    Class: Chronic  . DDD (degenerative disc disease), lumbar 01/20/2016    Class: Chronic  . Spinal stenosis of lumbar region with neurogenic claudication 01/20/2016  . Low back pain 12/22/2015  . Pre-op examination 12/22/2015  . OSA (obstructive sleep apnea) 10/13/2015  . Muscle cramp 08/01/2015  . GERD (gastroesophageal reflux disease)  07/09/2015  . Excessive daytime sleepiness 06/18/2015  . Shingles 08/28/2013  . Postop check 08/28/2013  . Cholelithiasis 07/10/2013  . Colitis 01/08/2013  . Postoperative anemia 01/08/2013  . Morbid obesity with BMI of 40.0-44.9, adult ([place]) 01/08/2013   Past Medical History:  Diagnosis Date  . Allergy   . Anemia    Iron deficiency  . Anxiety   . Arthritis    back, left shoulder  . Chicken pox   . Depression   . Diarrhea    takes Imodium daily  . GERD (gastroesophageal reflux disease)   . H/O hiatal hernia   . Headache(784.0)   . History of kidney stones    1996ish  . History of radiation therapy 11/16/16- 01/01/17   Base of Tongue/ 66 gy in 33 fractions/ Dose: 2 Gy  . Hypertension   . Migraine    none for 5 years (as of 01/13/16)  . OSA (obstructive sleep apnea) 10/13/2015   unable to get cpap, plans to get one in 2018  . Pneumonia   . Restless legs   . Scoliosis   . Shortness of breath    with exertion   Family History  Problem Relation Age of Onset  . Heart disease Father   . Heart attack Father   . Hypertension Mother   . Arthritis Mother   . Diabetes Maternal Grandmother   . Diabetes Paternal Grandmother   . Diabetes Maternal Uncle    Past Surgical History:  Procedure Laterality Date  . BACK SURGERY  07/21/1978  . CHOLECYSTECTOMY N/A 08/15/2013   Procedure: LAPAROSCOPIC CHOLECYSTECTOMY WITH INTRAOPERATIVE CHOLANGIOGRAM;  Surgeon: Gwenyth Ober, MD;  Location: [place];  Service: General;  Laterality: N/A;  . COLONOSCOPY N/A 01/09/2013   Procedure: COLONOSCOPY;  Surgeon: Beryle Beams, MD;  Location: [facility];  Service: Endoscopy;  Laterality: N/A;  . DIRECT LARYNGOSCOPY  07/2016   Dr. Nicolette Bang [facility]  . GASTROSTOMY TUBE PLACEMENT  09/27/2016  . HERNIA REPAIR Left 1981  . IR PATIENT EVAL TECH 0-60 MINS  12/13/2016  . MODIFIED RADICAL NECK DISSECTION Left 09/27/2016   Levels 1 & 2  . PARTIAL GLOSSECTOMY Left 09/27/2016   Left hemi partial glossectomy    . TONSILLECTOMY    . tracheotomy  09/27/2016  . TUBAL LIGATION  06/1988   Social History   Occupational History  . Occupation: Air cabin crew  Tobacco Use  . Smoking status: Never Smoker  . Smokeless tobacco: Never Used  Substance and Sexual Activity  . Alcohol use: Yes    Alcohol/week: 4.2 oz    Types: 7 Cans of beer per week    Comment: 1 beer daily  . Drug use: No  . Sexual activity: Yes    Birth control/protection: Surgical

## 2017-02-21 NOTE — Patient Instructions (Signed)

## 2017-02-21 NOTE — Progress Notes (Deleted)
Pt states a constant pain in right hip. Pt states pain has been going on since the end of December 2018. Pt states the pain is there all the time nothing makes it worse, resting eases pain. -Dye Allergies.

## 2017-02-22 MED ORDER — METHYLPREDNISOLONE ACETATE 80 MG/ML IJ SUSP
80.0000 mg | INTRAMUSCULAR | Status: AC | PRN
  Administered 2017-02-21: 80 mg via INTRA_ARTICULAR

## 2017-02-22 MED ORDER — BUPIVACAINE HCL 0.5 % IJ SOLN
2.0000 mL | INTRAMUSCULAR | Status: AC | PRN
  Administered 2017-02-21: 2 mL via INTRA_ARTICULAR

## 2017-02-23 ENCOUNTER — Ambulatory Visit (INDEPENDENT_AMBULATORY_CARE_PROVIDER_SITE_OTHER): Payer: BC Managed Care – PPO | Admitting: Surgery

## 2017-03-01 ENCOUNTER — Encounter: Payer: Self-pay | Admitting: Family Medicine

## 2017-03-02 ENCOUNTER — Encounter: Payer: Self-pay | Admitting: Family Medicine

## 2017-03-02 ENCOUNTER — Ambulatory Visit: Payer: BC Managed Care – PPO | Admitting: Family Medicine

## 2017-03-02 VITALS — BP 100/70 | HR 87 | Temp 99.4°F | Wt 214.1 lb

## 2017-03-02 DIAGNOSIS — R252 Cramp and spasm: Secondary | ICD-10-CM

## 2017-03-02 DIAGNOSIS — K121 Other forms of stomatitis: Secondary | ICD-10-CM | POA: Diagnosis not present

## 2017-03-02 DIAGNOSIS — G4733 Obstructive sleep apnea (adult) (pediatric): Secondary | ICD-10-CM | POA: Diagnosis not present

## 2017-03-02 DIAGNOSIS — I10 Essential (primary) hypertension: Secondary | ICD-10-CM | POA: Diagnosis not present

## 2017-03-02 DIAGNOSIS — D72818 Other decreased white blood cell count: Secondary | ICD-10-CM | POA: Diagnosis not present

## 2017-03-02 LAB — BASIC METABOLIC PANEL
BUN: 31 mg/dL — ABNORMAL HIGH (ref 6–23)
CALCIUM: 8.9 mg/dL (ref 8.4–10.5)
CHLORIDE: 100 meq/L (ref 96–112)
CO2: 31 meq/L (ref 19–32)
Creatinine, Ser: 1.12 mg/dL (ref 0.40–1.20)
GFR: 65.76 mL/min (ref >60.00)
Glucose, Bld: 84 mg/dL (ref 70–99)
POTASSIUM: 4.9 meq/L (ref 3.5–5.1)
Sodium: 138 mEq/L (ref 135–145)

## 2017-03-02 LAB — CBC WITH DIFFERENTIAL/PLATELET
Basophils Absolute: 0 10*3/uL (ref 0.0–0.1)
Basophils Relative: 0.8 % (ref 0.0–3.0)
EOS PCT: 4.1 % (ref 0.0–5.0)
Eosinophils Absolute: 0.2 10*3/uL (ref 0.0–0.7)
HCT: 33.9 % — ABNORMAL LOW (ref 36.0–46.0)
HEMOGLOBIN: 11.3 g/dL — AB (ref 12.0–15.0)
Lymphocytes Relative: 18.5 % (ref 12.0–46.0)
Lymphs Abs: 0.9 10*3/uL (ref 0.7–4.0)
MCHC: 33.3 g/dL (ref 30.0–36.0)
MCV: 82.3 fl (ref 78.0–100.0)
MONO ABS: 0.7 10*3/uL (ref 0.1–1.0)
Monocytes Relative: 13.7 % — ABNORMAL HIGH (ref 3.0–12.0)
Neutro Abs: 3.1 10*3/uL (ref 1.4–7.7)
Neutrophils Relative %: 62.9 % (ref 43.0–77.0)
Platelets: 258 10*3/uL (ref 150.0–400.0)
RBC: 4.11 Mil/uL (ref 3.87–5.11)
RDW: 16.2 % — ABNORMAL HIGH (ref 11.5–15.5)
WBC: 5 10*3/uL (ref 4.0–10.5)

## 2017-03-02 LAB — VITAMIN B12: VITAMIN B 12: 949 pg/mL — AB (ref 211–911)

## 2017-03-02 LAB — VITAMIN D 25 HYDROXY (VIT D DEFICIENCY, FRACTURES): VITD: 14.87 ng/mL — ABNORMAL LOW (ref 30.00–100.00)

## 2017-03-02 NOTE — Patient Instructions (Addendum)
Complete Blood Count Why am I having this test? A complete blood count is a group of tests that measures characteristics of three types of blood cells: white blood cells, red blood cells, and platelets. The following blood tests are included in a complete blood count:  White blood cell count. This test measures the number of white blood cells you have.  White blood cell differential. This test identifies the types of white blood cells you have and the concentration of each. It also identifies immature white blood cells.  Red blood cell count. This test measures the number of red blood cells you have.  Hemoglobin. This test measures the amount of hemoglobin in your blood.  Hematocrit. This test measures the percentage of space that red blood cells take up in your blood.  Mean corpuscular volume. This test measures the average size of your red blood cells.  Mean corpuscular hemoglobin. This test measures of the average amount of hemoglobin inside each of your red blood cells.  Mean corpuscular hemoglobin concentration. This test calculates the average concentration of hemoglobin inside each of your red blood cells.  Red blood cell distribution width. This test measures the variation in the size of your red blood cells.  Platelet count. This test measures the concentration of platelets in your blood.  Mean platelet volume. This test measures the average size of the platelets in your blood.  What kind of sample is taken? A blood sample is required for this test. It is usually collected by inserting a needle into a vein. How do I prepare for this test? There is no preparation required for this test. If this test is being performed in addition to other tests, your health care provider may ask you to fast before testing. What are the reference ranges? Reference ranges are considered healthy ranges established after testing a large group of healthy people. Reference ranges may vary among  different people, labs, and hospitals. These ranges will be provided by the lab or department performing your tests. It is your responsibility to obtain your test results. Ask the lab or department performing the test when and how you will get your results. What do the results mean? If your results are outside of the reference ranges, you may have a condition or illness, such as anemia, an infection, a bleeding problem, or cancer. The following are some examples of abnormal results and their possible causes. Abnormal Results Related to [facility] Blood Cells  An abnormally low concentration of white blood cells can be caused by certain infections and by conditions that interfere with the production of white blood cells in your bone marrow.  An abnormally high concentration of white blood cells may be related to infections and conditions that cause inflammation or a blood-related cancer.  The presence of immature white blood cells may be related to an infection or a condition affecting your bone marrow. Abnormal Results Related to Red Blood Cells  An abnormally low concentration of red blood cells, hemoglobin, or hematocrit is called anemia.  An abnormally high concentration of red blood cells, hemoglobin, or hematocrit is called polycythemia. It may be related to mild thalassemia. Thalassemia is a type of anemia that is passed down through families (hereditary).  An abnormally low mean corpuscular volume means that your red blood cells are smaller than normal. It may be related to thalassemia or iron deficiency anemia. Iron deficiency anemia is a type of anemia that results from a lack of iron.  An abnormally high mean  corpuscular volume means that your red blood cells are larger than normal. This may be related to anemia caused by a lack of vitamin B12. It can also be caused by a lot of new red blood cells in your blood. This can happen after you suddenly lose a lot of blood.  An abnormally low mean  corpuscular hemoglobin concentration can mean that you have a condition in which your hemoglobin is abnormally diluted inside the red cells. Examples of these types of conditions are iron deficiency anemia and thalassemia.  An abnormally high mean corpuscular hemoglobin concentration can mean that you have certain hemolytic anemias. Hemolytic anemia is anemia that results from the abnormal breakdown of your red blood cells.  An abnormally increased red cell distribution width may be related to certain anemias, sudden blood loss, or severe thalassemia. Abnormal Results Related to Platelets  An abnormally low concentration of platelets can be a sign of a bleeding disorder.  An abnormally high concentration of platelets can occur with iron deficiency anemia, inflammatory disorders, and cancers. It can result from physical stresses, such as exercise or blood loss. It may also be a sign of a clotting disorder.  An abnormally high mean platelet volume can occur with certain bone marrow cancers and with a large increase in the number of new platelets being produced by your bone marrow. This can happen after the loss of a large amount of blood or the destruction of your platelets by antibodies. Talk with your health care provider to discuss your results, treatment options, and if necessary, the need for more tests. Talk with your health care provider if you have any questions about your results. Talk with your health care provider to discuss your results, treatment options, and if necessary, the need for more tests. Talk with your health care provider if you have any questions about your results. This information is not intended to replace advice given to you by your health care provider. Make sure you discuss any questions you have with your health care provider. Document Released: 02/07/2004 Document Revised: 09/09/2015 Document Reviewed: 05/31/2013 Elsevier Interactive Patient Education  2018 [employer].  Leukopenia Leukopenia is a condition in which you have a low number of white blood cells. White blood cells help the body to fight infections. The number of white blood cells in the body varies from person to person. There are five types of white blood cells. Two types (lymphocytes and neutrophils) make up most of the white blood cell count. When lymphocytes are low, the condition is called lymphocytopenia. When neutrophils are low, it is called neutropenia. Neutropenia is the most dangerous type of leukopenia because it can lead to dangerous infections. What are the causes? This condition is commonly caused by damage to soft tissue inside of the bones (bone marrow), which is where most white blood cells are made. Bone marrow can get damaged by:  Medicine or X-ray treatments for cancer (chemotherapy or radiation therapy).  Serious infections.  Cancer of the white blood cells (leukemia, lymphoma, or myeloma).  Medicines, including: ? Certain antibiotics. ? Certain heart medicines. ? Steroids. ? Certain medicines used to treat diseases of the immune system (autoimmune diseases), like rheumatoid arthritis.  Leukopenia also happens when white blood cells are destroyed after leaving the bone marrow, which may result from:  Liver disease.  Autoimmune disease.  Vitamin B deficiencies.  What are the signs or symptoms? One of the most common signs of leukopenia, especially severe neutropenia, is having a lot  of bacterial infections. Different infections have different symptoms. An infection in your lungs may cause coughing. A urinary tract infection may cause frequent urination and a burning sensation. You may also get infections of the blood, skin, rectum, throat, sinuses, or ears. Some people have no symptoms. If you do have symptoms, they may include:  Fever.  Fatigue.  Swollen glands (lymph nodes).  Painful mouth ulcers.  Gum disease.  How is this diagnosed? This condition  may be diagnosed based on:  Your medical history.  A physical exam to check for swollen lymph nodes and an enlarged spleen. Your spleen is an organ on the left side of your body that stores white blood cells.  Tests, such as: ? A complete blood count. This blood test counts each type of white cell. ? Bone marrow aspiration. Some bone marrow is removed to be checked under a microscope. ? Lymph node biopsy. Some lymph node tissue is removed to be checked under a microscope. ? Other types of blood tests or imaging tests.  How is this treated? Treatment of leukopenia depends on the cause. Some common treatments include:  Antibiotic medicine to treat bacterial infections.  Stopping medicines that may cause leukopenia.  Medicines to stimulate neutrophil production (hematopoietic growth factors), to treat neutropenia.  Follow these instructions at home:  Take over-the-counter and prescription medicines only as told by your health care provider. This includes supplements and vitamins.  If you were prescribed an antibiotic medicine, take it as told by your health care provider. Do not stop taking the antibiotic even if you start to feel better.  Preventing infection is important if you have leukopenia. To prevent infection: ? Avoid close contact with sick people. ? Wash your hands frequently with soap and water. If soap and water are not available, use hand sanitizer. ? Do not&nbsp;eat uncooked or undercooked meats. ? Wash fruits and vegetables before eating them. ? Do not eat or drink unpasteurized dairy products. ? Get regular dental care, and maintain good dental hygiene. You should visit the dentist at least once every 6 months.  Keep all follow-up visits as told by your health care provider. This is important. Contact a health care provider if:  You have chills or a fever.  You have symptoms of an infection. Get help right away if:  You have a fever that lasts for more than 2-3  days.  You have symptoms that last for more than 2-3 days.  You have trouble breathing.  You have chest pain. This information is not intended to replace advice given to you by your health care provider. Make sure you discuss any questions you have with your health care provider. Document Released: 01/09/2013 Document Revised: 11/25/2015 Document Reviewed: 11/25/2015 Elsevier Interactive Patient Education  2018 [place].  Oral Ulcers Oral ulcers are sores inside the mouth or near the mouth. They may be called canker sores or cold sores, which are two types of oral ulcers. Many oral ulcers are harmless and go away on their own. In some cases, oral ulcers may require medical care to determine the cause and proper treatment. What are the causes? Common causes of this condition include:  Viral, bacterial, or fungal infection.  Emotional stress.  Foods or chemicals that irritate the mouth.  Injury or physical irritation of the mouth.  Medicines.  Allergies.  Tobacco use.  Less common causes include:  Skin disease.  A type of herpes virus infection (herpes simplexor herpes zoster).  Oral cancer.  In  some cases, the cause of this condition may not be known. What increases the risk? Oral ulcers are more likely to develop in:  People who wear dental braces, dentures, or retainers.  People who do not keep their mouth clean or brush their teeth regularly.  People who have sensitive skin.  People who have conditions that affect the entire body (systemic conditions), such as immune disorders.  What are the signs or symptoms? The main symptom of this condition is one or more oval-shaped or round ulcers that have red borders. Details about symptoms may vary depending on the cause.  Location of the ulcers. They may be inside the mouth, on the gums, or on the insides of the lips or cheeks. They may also be on the lips or on skin that is near the mouth, such as the cheeks and  chin.  Pain. Ulcers can be painful and uncomfortable, or they can be painless.  Appearance of the ulcers. They may look like red blisters and be filled with fluid, or they may be white or yellow patches.  Frequency of outbreaks. Ulcers may go away permanently after one outbreak, or they may come back (recur) often or rarely.  How is this diagnosed? This condition is diagnosed with a physical exam. Your health care provider may ask you questions about your lifestyle and your medical history. You may have tests, including:  Blood tests.  Removal of a small number of cells from an ulcer to be examined under a microscope (biopsy).  How is this treated? This condition is treated by managing any pain and discomfort, and by treating the underlying cause of the ulcers, if necessary. Usually, oral ulcers resolve by themselves in 1-2 weeks. You may be told to keep your mouth clean and avoid things that cause or irritate your ulcers. Your health care provider may prescribe medicines to reduce pain and discomfort or treat the underlying cause, if this applies. Follow these instructions at home: Lifestyle  Follow instructions from your health care provider about eating or drinking restrictions. ? Drink enough fluid to keep your urine clear or pale yellow. ? Avoid foods and drinks that irritate your ulcers.  Avoid tobacco products, including cigarettes, chewing tobacco, or e-cigarettes. If you need help quitting, ask your health care provider.  Avoid excessive alcohol use. Oral Hygiene  Avoid physical or chemical irritants that may have caused the ulcers or made them worse, such as mouthwashes that contain alcohol (ethanol). If you wear dental braces, dentures, or retainers, work with your health care provider to make sure these devices are fitted correctly.  Brush and floss your teeth at least once every day, and get regular dental cleanings and checkups.  Gargle with a salt-water mixture 3-4  times per day or as told by your health care provider. To make a salt-water mixture, completely dissolve -1 tsp of salt in 1 cup of warm water. General instructions  Take over-the-counter and prescription medicines only as told by your health care provider.  If you have pain, wrap a cold compress in a towel and gently press it against your face to help reduce pain.  Keep all follow-up visits as told by your health care provider. This is important. Contact a health care provider if:  You have pain that gets worse or does not get better with medicine.  You have 4 or more ulcers at one time.  You have a fever.  You have new ulcers that look or feel different from other ulcers  you have.  You have inflammation in one eye or both eyes.  You have ulcers that do not go away after 10 days.  You develop new symptoms in your mouth, such as: ? Bleeding or crusting around your lips or gums. ? Tooth pain. ? Difficulty swallowing.  You develop symptoms on your skin or genitals, such as: ? A rash or blisters. ? Burning or itching sensations.  Your ulcers begin or get worse after you start a new medicine. Get help right away if:  You have difficulty breathing.  You have swelling in your face or neck.  You have excessive bleeding from your mouth.  You have severe pain. This information is not intended to replace advice given to you by your health care provider. Make sure you discuss any questions you have with your health care provider. Document Released: 02/12/2004 Document Revised: 06/09/2015 Document Reviewed: 05/22/2014 Elsevier Interactive Patient Education  2018 [place].  Sleep Apnea Sleep apnea is a condition that affects breathing. People with sleep apnea have moments during sleep when their breathing pauses briefly or gets shallow. Sleep apnea can cause these symptoms:  Trouble staying asleep.  Sleepiness or tiredness during the day.  Irritability.  Loud  snoring.  Morning headaches.  Trouble concentrating.  Forgetting things.  Less interest in sex.  Being sleepy for no reason.  Mood swings.  Personality changes.  Depression.  Waking up a lot during the night to pee (urinate).  Dry mouth.  Sore throat.  Follow these instructions at home:  Make any changes in your routine that your doctor recommends.  Eat a healthy, well-balanced diet.  Take over-the-counter and prescription medicines only as told by your doctor.  Avoid using alcohol, calming medicines (sedatives), and narcotic medicines.  Take steps to lose weight if you are overweight.  If you were given a machine (device) to use while you sleep, use it only as told by your doctor.  Do not use any tobacco products, such as cigarettes, chewing tobacco, and e-cigarettes. If you need help quitting, ask your doctor.  Keep all follow-up visits as told by your doctor. This is important. Contact a doctor if:  The machine that you were given to use during sleep is uncomfortable or does not seem to be working.  Your symptoms do not get better.  Your symptoms get worse. Get help right away if:  Your chest hurts.  You have trouble breathing in enough air (shortness of breath).  You have an uncomfortable feeling in your back, arms, or stomach.  You have trouble talking.  One side of your body feels weak.  A part of your face is hanging down (drooping). These symptoms may be an emergency. Do not wait to see if the symptoms will go away. Get medical help right away. Call your local emergency services (911 in the [place] Do not drive yourself to the hospital. This information is not intended to replace advice given to you by your health care provider. Make sure you discuss any questions you have with your health care provider. Document Released: 10/14/2007 Document Revised: 08/31/2015 Document Reviewed: 10/14/2014 Elsevier Interactive Patient Education  [employer].

## 2017-03-02 NOTE — Progress Notes (Signed)
Subjective:    Patient ID: Kristen [name], female    DOB: 1965-01-20, 52 y.o.   MRN: [id number]  Chief Complaint  Patient presents with  . Hospitalization Follow-up    HPI Patient was seen today for f/u.   Patient was recently hospitalized on 1/24 through 02/13/2017 for hypotension, UTI (treated with ceftriaxone and cefazolin, d/c'd home on Keflex x 5 days), rectal bleeding 2/2 hemorrhoids.  Since d/c pt states she has been doing better.  Pt notes her lisinopril was d/c'd but she is still taking Norvasc.  Pt states she is no longer having rectal bleeding.  Pt states when she restarts her viberzi she has a hard bm, then they become softer.  Pt denies current diarrhea.  Pt endorses muscle cramps in legs when waking up in the am.  Pt also notes continued difficulty taking foods p.o.  Patient states after she starts eating she gets a sore area on the roof of her mouth which makes it difficult to swallow.  In the past we have discussed using lidocaine solution prior to eating p.o. but patient has not done this.  Pt has not been seen by GI.  Past Medical History:  Diagnosis Date  . Allergy   . Anemia    Iron deficiency  . Anxiety   . Arthritis    back, left shoulder  . Chicken pox   . Depression   . Diarrhea    takes Imodium daily  . GERD (gastroesophageal reflux disease)   . H/O hiatal hernia   . Headache(784.0)   . History of kidney stones    1996ish  . History of radiation therapy 11/16/16- 01/01/17   Base of Tongue/ 66 gy in 33 fractions/ Dose: 2 Gy  . Hypertension   . Migraine    none for 5 years (as of 01/13/16)  . OSA (obstructive sleep apnea) 10/13/2015   unable to get cpap, plans to get one in 2018  . Pneumonia   . Restless legs   . Scoliosis   . Shortness of breath    with exertion    Allergies  Allergen Reactions  . Other Hives and Swelling    tomato  . Sulfur Hives and Swelling    ROS General: Denies fever, chills, night sweats, changes in weight, changes  in appetite  + daytime somnolence, snoring, leukopenia HEENT: Denies headaches, ear pain, changes in vision, rhinorrhea, sore throat  + sore in roof of mouth CV: Denies CP, palpitations, SOB, orthopnea Pulm: Denies SOB, cough, wheezing GI: Denies abdominal pain, nausea, vomiting, diarrhea, constipation GU: Denies dysuria, hematuria, frequency, vaginal discharge Msk: Denies joint pains  +muscle cramps Neuro: Denies weakness, numbness, tingling Skin: Denies rashes, bruising Psych: Denies depression, anxiety, hallucinations     Objective:    Blood pressure 100/70, pulse 87, temperature 99.4 F (37.4 C), temperature source Oral, weight 214 lb 1.6 oz (97.1 kg), last menstrual period 11/15/2013.   Gen. Pleasant, well-nourished, in no distress, normal affect   HEENT: wearing glasses, [place]/AT, face symmetric, no scleral icterus, PERRLA, nares patent without drainage, pt is edentulous, pharynx without erythema or exudate.  No ulcers or sores noted in mouth. Lungs: no accessory muscle use, CTAB, no wheezes or rales Cardiovascular: RRR, no m/r/g, no peripheral edema Abdomen: BS present, soft, NT/ND Neuro:  A&Ox3, CN II-XII intact, normal gait    Wt Readings from Last 3 Encounters:  03/02/17 214 lb 1.6 oz (97.1 kg)  02/11/17 223 lb 1.7 oz (101.2 kg)  02/02/17 221  lb (100.2 kg)    Lab Results  Component Value Date   WBC 2.6 (L) 02/13/2017   HGB 10.3 (L) 02/13/2017   HCT 32.4 (L) 02/13/2017   PLT 127 (L) 02/13/2017   GLUCOSE 89 02/13/2017   ALT 19 02/11/2017   AST 22 02/11/2017   NA 141 02/13/2017   K 3.8 02/13/2017   CL 108 02/13/2017   CREATININE 0.62 02/13/2017   BUN 11 02/13/2017   CO2 27 02/13/2017   TSH 1.121 11/05/2016   INR 0.94 01/13/2016   HGBA1C 5.6 06/18/2015    Assessment/Plan:  Essential hypertension  -will continue norvasc 10 mg daily -agree with d/c lisinopril at this time. -pt encouraged to check bp at home and keep a log. - Plan: Basic metabolic  panel  OSA (obstructive sleep apnea) -daytime somnolence. -pt rescheduled sleep study for March.  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 -pt advised not to miss the appointment  Mouth ulcer  -likely 2/2 radiation hx.  No current lesions noted on physical exam. -pt advised to use lidocaine solution prior to eating. - Plan: Ambulatory referral to Gastroenterology  Other decreased white blood cell (WBC) count  -noted on labs at hospital -will repeat. -discussed possible causes with pt.  Given handout. - Plan: CBC with Differential/Platelet, CBC with Differential/Platelet  Muscle cramps  -discussed trying a table spoon full of yellow mustard prior to bed.  The tumeric has anti-inflammatory properties. - Plan: Vitamin D, 25-hydroxy, Vitamin B12  F/u in the next few months, sooner if needed.   Grier Mitts, MD

## 2017-03-03 ENCOUNTER — Other Ambulatory Visit: Payer: Self-pay | Admitting: Emergency Medicine

## 2017-03-03 ENCOUNTER — Telehealth: Payer: Self-pay | Admitting: *Deleted

## 2017-03-03 ENCOUNTER — Encounter (INDEPENDENT_AMBULATORY_CARE_PROVIDER_SITE_OTHER): Payer: Self-pay | Admitting: Surgery

## 2017-03-03 ENCOUNTER — Encounter: Payer: Self-pay | Admitting: Family Medicine

## 2017-03-03 ENCOUNTER — Other Ambulatory Visit: Payer: Self-pay | Admitting: Family Medicine

## 2017-03-03 ENCOUNTER — Ambulatory Visit (INDEPENDENT_AMBULATORY_CARE_PROVIDER_SITE_OTHER): Payer: BC Managed Care – PPO | Admitting: Surgery

## 2017-03-03 DIAGNOSIS — M533 Sacrococcygeal disorders, not elsewhere classified: Secondary | ICD-10-CM | POA: Diagnosis not present

## 2017-03-03 DIAGNOSIS — R7989 Other specified abnormal findings of blood chemistry: Secondary | ICD-10-CM

## 2017-03-03 DIAGNOSIS — M4326 Fusion of spine, lumbar region: Secondary | ICD-10-CM

## 2017-03-03 DIAGNOSIS — E559 Vitamin D deficiency, unspecified: Secondary | ICD-10-CM

## 2017-03-03 DIAGNOSIS — G8929 Other chronic pain: Secondary | ICD-10-CM

## 2017-03-03 MED ORDER — TRAMADOL HCL 50 MG PO TABS: 50.0000 mg | ORAL_TABLET | Freq: Two times a day (BID) | ORAL | 0 refills | Status: DC | PRN

## 2017-03-03 MED ORDER — VITAMIN D (ERGOCALCIFEROL) 1.25 MG (50000 UNIT) PO CAPS: 50000.0000 [IU] | ORAL_CAPSULE | ORAL | 0 refills | Status: DC

## 2017-03-03 NOTE — Telephone Encounter (Signed)
Oncology Nurse Navigator Documentation  Pt called indicating she needed syringes for PEG.  I left bag with 12 at [facility] front desk for her pick-up later this morning.  Gayleen Orem, RN, BSN Head & Neck Oncology Nurse [place] at [place] [phone]

## 2017-03-03 NOTE — Progress Notes (Signed)
Office Visit Note   Patient: Kristen [name]           Date of Birth: Oct 06, 1965           MRN: [id number] Visit Date: 03/03/2017              Requested by: [name], MD [place], [place] [address] PCP: [name], MD   Assessment & Plan: Visit Diagnoses:  1. Fusion of lumbar spine   2. Chronic SI joint pain     Plan: Patient asked about doing repeat SI joint in the future if needed.  I did discuss with her the possibility of getting Dr. Ernestina Patches to perform right SI joint RF ablation if her pain returns since she did have very good diagnostic and therapeutic relief up to this point.  Follow with me in 6 months for recheck but will let me know sooner if her pain returns.  I did refill Ultram.  Follow-Up Instructions: Return in about 6 months (around 08/31/2017) for with Kathe Wirick.   Orders:  No orders of the defined types were placed in this encounter.  Meds ordered this encounter  Medications  . traMADol (ULTRAM) 50 MG tablet    Sig: Take 1 tablet (50 mg total) by mouth every 12 (twelve) hours as needed.    Dispense:  60 tablet    Refill:  0      Procedures: No procedures performed   Clinical Data: No additional findings.   Subjective: Chief Complaint  Patient presents with  . Lower Back - Follow-up    HPI Patient returns after having right-sided SI joint injection.  States that this did very well and she had about 75% improvement of her right-sided low back pain.  Better mobility.  She is wanting a refill of Ultram. Review of Systems No current cardiac pulmonary GI GU issues  Objective: Vital Signs: LMP 11/15/2013   Physical Exam  Constitutional: She is oriented to person, place, and time. No distress.  HENT:  Head: Normocephalic and atraumatic.  Pulmonary/Chest: No respiratory distress.  Musculoskeletal:  Patient is definitely moving better today.  Nontender over the right SI joint.  Negative logroll.  Negative straight leg  raise.  Neurological: She is alert and oriented to person, place, and time.    Ortho Exam  Specialty Comments:  No specialty comments available.  Imaging: No results found.   PMFS History: Patient Active Problem List   Diagnosis Date Noted  . Hypotension 02/10/2017  . 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 Malignant neoplasm of base of tongue ([place]) 10/29/2016  . 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[place]) 10/29/2016  . Headache associated with sexual activity 05/14/2016  . Tongue lesion 05/14/2016  . Loose stools 03/12/2016  . Hypocalcemia 01/22/2016  . Thrombocytopenia ([place]) 01/22/2016  . Chronic diarrhea 01/22/2016  . Chest pain   . Essential hypertension   . Orthostatic hypotension   . Spinal stenosis of lumbar region 01/20/2016    Class: Chronic  . DDD (degenerative disc disease), lumbar 01/20/2016    Class: Chronic  . Spinal stenosis of lumbar region with neurogenic claudication 01/20/2016  . Low back pain 12/22/2015  . Pre-op examination 12/22/2015  . OSA (obstructive sleep apnea) 10/13/2015  . Muscle cramp 08/01/2015  . GERD (gastroesophageal reflux disease) 07/09/2015  . Excessive daytime sleepiness 06/18/2015  . Shingles 08/28/2013  . Postop check 08/28/2013  . Cholelithiasis 07/10/2013  . Colitis 01/08/2013  .  Postoperative anemia 01/08/2013  . Morbid obesity with BMI of 40.0-44.9, adult ([place]) 01/08/2013   Past Medical History:  Diagnosis Date  . Allergy   . Anemia    Iron deficiency  . Anxiety   . Arthritis    back, left shoulder  . Chicken pox   . Depression   . Diarrhea    takes Imodium daily  . GERD (gastroesophageal reflux disease)   . H/O hiatal hernia   . Headache(784.0)   . History of kidney stones    1996ish  . History of radiation therapy 11/16/16- 01/01/17   Base of Tongue/ 66 gy in 33 fractions/ Dose: 2 Gy  . Hypertension   . Migraine    none for 5 years (as of 01/13/16)  . OSA (obstructive sleep apnea)  10/13/2015   unable to get cpap, plans to get one in 2018  . Pneumonia   . Restless legs   . Scoliosis   . Shortness of breath    with exertion    Family History  Problem Relation Age of Onset  . Heart disease Father   . Heart attack Father   . Hypertension Mother   . Arthritis Mother   . Diabetes Maternal Grandmother   . Diabetes Paternal Grandmother   . Diabetes Maternal Uncle     Past Surgical History:  Procedure Laterality Date  . BACK SURGERY  07/21/1978  . CHOLECYSTECTOMY N/A 08/15/2013   Procedure: LAPAROSCOPIC CHOLECYSTECTOMY WITH INTRAOPERATIVE CHOLANGIOGRAM;  Surgeon: Gwenyth Ober, MD;  Location: [facility];  Service: General;  Laterality: N/A;  . COLONOSCOPY N/A 01/09/2013   Procedure: COLONOSCOPY;  Surgeon: Beryle Beams, MD;  Location: [facility];  Service: Endoscopy;  Laterality: N/A;  . DIRECT LARYNGOSCOPY  07/2016   Dr. Nicolette Bang [facility]  . GASTROSTOMY TUBE PLACEMENT  09/27/2016  . HERNIA REPAIR Left 1981  . IR PATIENT EVAL TECH 0-60 MINS  12/13/2016  . MODIFIED RADICAL NECK DISSECTION Left 09/27/2016   Levels 1 & 2  . PARTIAL GLOSSECTOMY Left 09/27/2016   Left hemi partial glossectomy  . TONSILLECTOMY    . tracheotomy  09/27/2016  . TUBAL LIGATION  06/1988   Social History   Occupational History  . Occupation: Air cabin crew  Tobacco Use  . Smoking status: Never Smoker  . Smokeless tobacco: Never Used  Substance and Sexual Activity  . Alcohol use: Yes    Alcohol/week: 4.2 oz    Types: 7 Cans of beer per week    Comment: 1 beer daily  . Drug use: No  . Sexual activity: Yes    Birth control/protection: Surgical

## 2017-03-04 ENCOUNTER — Other Ambulatory Visit: Payer: Self-pay | Admitting: Family Medicine

## 2017-03-04 ENCOUNTER — Encounter: Payer: Self-pay | Admitting: Family Medicine

## 2017-03-04 DIAGNOSIS — E559 Vitamin D deficiency, unspecified: Secondary | ICD-10-CM

## 2017-03-04 MED ORDER — ERGOCALCIFEROL 8000 UNIT/ML PO SOLN: 48000.0000 [IU] | ORAL | 1 refills | Status: DC

## 2017-03-07 ENCOUNTER — Other Ambulatory Visit (INDEPENDENT_AMBULATORY_CARE_PROVIDER_SITE_OTHER): Payer: BC Managed Care – PPO

## 2017-03-07 ENCOUNTER — Other Ambulatory Visit: Payer: BC Managed Care – PPO

## 2017-03-07 DIAGNOSIS — R7989 Other specified abnormal findings of blood chemistry: Secondary | ICD-10-CM | POA: Diagnosis not present

## 2017-03-07 LAB — BASIC METABOLIC PANEL
BUN: 23 mg/dL (ref 6–23)
CO2: 28 mEq/L (ref 19–32)
CREATININE: 1.01 mg/dL (ref 0.40–1.20)
Calcium: 9.2 mg/dL (ref 8.4–10.5)
Chloride: 103 mEq/L (ref 96–112)
GFR: 74.09 mL/min (ref >60.00)
Glucose, Bld: 87 mg/dL (ref 70–99)
POTASSIUM: 4.7 meq/L (ref 3.5–5.1)
Sodium: 140 mEq/L (ref 135–145)

## 2017-03-08 ENCOUNTER — Other Ambulatory Visit: Payer: Self-pay

## 2017-03-08 ENCOUNTER — Emergency Department (HOSPITAL_COMMUNITY)
Admission: EM | Admit: 2017-03-08 | Discharge: 2017-03-08 | Disposition: A | Discharge status: Home / Self Care | Payer: BC Managed Care – PPO | Attending: Emergency Medicine | Admitting: Emergency Medicine

## 2017-03-08 ENCOUNTER — Encounter (HOSPITAL_COMMUNITY): Payer: Self-pay | Admitting: *Deleted

## 2017-03-08 DIAGNOSIS — Z79899 Other long term (current) drug therapy: Secondary | ICD-10-CM | POA: Diagnosis not present

## 2017-03-08 DIAGNOSIS — R7989 Other specified abnormal findings of blood chemistry: Secondary | ICD-10-CM | POA: Diagnosis not present

## 2017-03-08 DIAGNOSIS — Z7982 Long term (current) use of aspirin: Secondary | ICD-10-CM | POA: Insufficient documentation

## 2017-03-08 LAB — CBC WITH DIFFERENTIAL/PLATELET
BASOS ABS: 0 10*3/uL (ref 0.0–0.1)
Basophils Relative: 0 %
EOS PCT: 3 %
Eosinophils Absolute: 0.2 10*3/uL (ref 0.0–0.7)
HEMATOCRIT: 35.8 % — AB (ref 36.0–46.0)
Hemoglobin: 11.5 g/dL — ABNORMAL LOW (ref 12.0–15.0)
LYMPHS ABS: 1 10*3/uL (ref 0.7–4.0)
LYMPHS PCT: 21 %
MCH: 26.8 pg (ref 26.0–34.0)
MCHC: 32.1 g/dL (ref 30.0–36.0)
MCV: 83.4 fL (ref 78.0–100.0)
MONO ABS: 0.3 10*3/uL (ref 0.1–1.0)
MONOS PCT: 5 %
NEUTROS ABS: 3.5 10*3/uL (ref 1.7–7.7)
Neutrophils Relative %: 71 %
Platelets: 253 10*3/uL (ref 150–400)
RBC: 4.29 MIL/uL (ref 3.87–5.11)
RDW: 15.1 % (ref 11.5–15.5)
WBC: 5 10*3/uL (ref 4.0–10.5)

## 2017-03-08 LAB — URINALYSIS, ROUTINE W REFLEX MICROSCOPIC
BACTERIA UA: NONE SEEN
BILIRUBIN URINE: NEGATIVE
Glucose, UA: NEGATIVE mg/dL
HGB URINE DIPSTICK: NEGATIVE
Ketones, ur: NEGATIVE mg/dL
Nitrite: NEGATIVE
PROTEIN: NEGATIVE mg/dL
Specific Gravity, Urine: 1.016 (ref 1.005–1.030)
pH: 5 (ref 5.0–8.0)

## 2017-03-08 LAB — BASIC METABOLIC PANEL
Anion gap: 12 (ref 5–15)
BUN: 24 mg/dL — AB (ref 6–20)
CALCIUM: 9 mg/dL (ref 8.9–10.3)
CO2: 24 mmol/L (ref 22–32)
Chloride: 104 mmol/L (ref 101–111)
Creatinine, Ser: 0.99 mg/dL (ref 0.44–1.00)
GFR calc Af Amer: >60 mL/min (ref >60)
GFR calc non Af Amer: >60 mL/min (ref >60)
GLUCOSE: 87 mg/dL (ref 65–99)
Potassium: 4.4 mmol/L (ref 3.5–5.1)
Sodium: 140 mmol/L (ref 135–145)

## 2017-03-08 MED ORDER — SODIUM CHLORIDE 0.9 % IV BOLUS (SEPSIS)
1000.0000 mL | Freq: Once | INTRAVENOUS | Status: AC
  Administered 2017-03-08: 1000 mL via INTRAVENOUS

## 2017-03-08 NOTE — ED Notes (Signed)
Pt is alert and oriented x 4  And is verbally responsive. Pt was sent her by her PCP for "elevated kidney labs. Pt reports 3/10 pain to her flanks aches. Pt denies any pain or burning with urination. Pt is escorted with daughter.

## 2017-03-08 NOTE — ED Provider Notes (Signed)
[facility] DEPT Provider Note   CSN: [id number] Arrival date & time: 03/08/17  1017     History   Chief Complaint Chief Complaint  Patient presents with  . Abnormal Lab    HPI Kristen [name] is a 52 y.o. female.  HPI   Kristen [name] is a 52 y.o. female, with a history of iron deficiency anemia, GERD, HTN, presenting to the ED with increased creatinine. Per PCP note, patient had a creatinine of 0.62 when she was discharged from the hospital on February 13, 2017, but then had a follow-up appointment she had a creatinine of 1.12 drawn on February 13.  Repeat creatinine today in the office was 1.01.  Patient was instructed to come to the ED for IV fluids due to AKI.  Denies fever/chills, difficulty urinating, dysuria, hematuria, abdominal pain, nausea/vomiting, back pain, or any other complaints.     Past Medical History:  Diagnosis Date  . Allergy   . Anemia    Iron deficiency  . Anxiety   . Arthritis    back, left shoulder  . Chicken pox   . Depression   . Diarrhea    takes Imodium daily  . GERD (gastroesophageal reflux disease)   . H/O hiatal hernia   . Headache(784.0)   . History of kidney stones    1996ish  . History of radiation therapy 11/16/16- 01/01/17   Base of Tongue/ 66 gy in 33 fractions/ Dose: 2 Gy  . Hypertension   . Migraine    none for 5 years (as of 01/13/16)  . OSA (obstructive sleep apnea) 10/13/2015   unable to get cpap, plans to get one in 2018  . Pneumonia   . Restless legs   . Scoliosis   . Shortness of breath    with exertion    Patient Active Problem List   Diagnosis Date Noted  . Hypotension 02/10/2017  . 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 Malignant neoplasm of base of tongue ([place]) 10/29/2016  . 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[place]) 10/29/2016  . Headache associated with sexual activity 05/14/2016  . Tongue lesion 05/14/2016  . Loose stools 03/12/2016  .  Hypocalcemia 01/22/2016  . Thrombocytopenia ([place]) 01/22/2016  . Chronic diarrhea 01/22/2016  . Chest pain   . Essential hypertension   . Orthostatic hypotension   . Spinal stenosis of lumbar region 01/20/2016    Class: Chronic  . DDD (degenerative disc disease), lumbar 01/20/2016    Class: Chronic  . Spinal stenosis of lumbar region with neurogenic claudication 01/20/2016  . Low back pain 12/22/2015  . Pre-op examination 12/22/2015  . OSA (obstructive sleep apnea) 10/13/2015  . Muscle cramp 08/01/2015  . GERD (gastroesophageal reflux disease) 07/09/2015  . Excessive daytime sleepiness 06/18/2015  . Shingles 08/28/2013  . Postop check 08/28/2013  . Cholelithiasis 07/10/2013  . Colitis 01/08/2013  . Postoperative anemia 01/08/2013  . Morbid obesity with BMI of 40.0-44.9, adult ([place]) 01/08/2013    Past Surgical History:  Procedure Laterality Date  . BACK SURGERY  07/21/1978  . CHOLECYSTECTOMY N/A 08/15/2013   Procedure: LAPAROSCOPIC CHOLECYSTECTOMY WITH INTRAOPERATIVE CHOLANGIOGRAM;  Surgeon: Gwenyth Ober, MD;  Location: [facility];  Service: General;  Laterality: N/A;  . COLONOSCOPY N/A 01/09/2013   Procedure: COLONOSCOPY;  Surgeon: Beryle Beams, MD;  Location: [facility];  Service: Endoscopy;  Laterality: N/A;  . DIRECT LARYNGOSCOPY  07/2016   Dr. Nicolette Bang [facility]  . GASTROSTOMY TUBE PLACEMENT  09/27/2016  . HERNIA REPAIR Left 1981  . IR PATIENT EVAL TECH 0-60 MINS  12/13/2016  . MODIFIED RADICAL NECK DISSECTION Left 09/27/2016   Levels 1 & 2  . PARTIAL GLOSSECTOMY Left 09/27/2016   Left hemi partial glossectomy  . TONSILLECTOMY    . tracheotomy  09/27/2016  . TUBAL LIGATION  06/1988    OB History    No data available       Home Medications    Prior to Admission medications   Medication Sig Start Date End Date Taking? Authorizing Provider  amLODipine (NORVASC) 10 MG tablet Take 1 tablet (10 mg total) daily by mouth. 11/30/16  Yes [name], MD  aspirin 81 MG  chewable tablet 81 mg. 10/05/16  Yes [provider]  diclofenac (VOLTAREN) 75 MG EC tablet TAKE ONE TABLET BY MOUTH TWICE DAILY WITH FOOD AS NEEDED FOR PAIN Patient taking differently: 75 mg. TAKE ONE TABLET BY MOUTH TWICE DAILY WITH FOOD AS NEEDED FOR PAIN 11/22/16  Yes Jessy Oto, MD  Eluxadoline (VIBERZI) 100 MG TABS Take 1 tablet (100 mg total) 2 (two) times daily by mouth. 11/30/16  Yes [name], MD  gabapentin (NEURONTIN) 300 MG capsule Take 1 capsule by mouth 3 times daily. 01/12/17  Yes Leandrew Koyanagi, MD  pantoprazole (PROTONIX) 40 MG tablet Take 1 tablet (40 mg total) daily by mouth. 11/30/16  Yes [name], MD  Potassium 99 MG TABS 1 tablet by Gastrostomy Tube route daily. 11/30/16  Yes [name], MD  traMADol (ULTRAM) 50 MG tablet Take 1 tablet (50 mg total) by mouth every 12 (twelve) hours as needed. 03/03/17  Yes Lanae Crumbly, PA-C  cephALEXin (KEFLEX) 500 MG capsule Take 1 capsule (500 mg total) by mouth 2 (two) times daily. Patient not taking: Reported on 03/08/2017 02/13/17   Oswald Hillock, MD  Doxylamine-DM [facility]) 6.25-15 MG/15ML LIQD Take 14m before bedtime for cold/cough. Patient not taking: Reported on 03/08/2017 01/14/17   SEppie Gibson MD  ergocalciferol (DRISDOL) 8000 UNIT/ML drops Place 6 mLs (48,000 Units total) into feeding tube once a week. Patient not taking: Reported on 03/08/2017 03/04/17   BBillie Ruddy MD  HYDROcodone-acetaminophen (NORCO/VICODIN) 5-325 MG tablet Take 1-2 tablets by mouth every 4 (four) hours as needed for moderate pain. Taper as pain improves. Patient not taking: Reported on 03/08/2017 01/14/17   SEppie Gibson MD  indomethacin (INDOCIN) 25 MG capsule Take 1 capsule by mouth 30-60 minutes prior to sexual activity. Patient not taking: Reported on 03/08/2017 05/14/16   CGolden Circle FNP  lidocaine (XYLOCAINE) 2 % solution Mix 1 part 2%viscous lidocaine,1part H2O.Swish and/or swallow 141mof this  mixture,3019mbefore meals and at bedtime, up to QID Patient not taking: Reported on 03/08/2017 11/22/16   SquEppie GibsonD  LORazepam (ATIVAN) 0.5 MG tablet Take 1 tab PRN nausea. Do not take more than 3 pills daily. Patient not taking: Reported on 03/08/2017 12/31/16   SquEppie GibsonD  Phenylephrine-Guaifenesin 7.5-100 MG/5ML LIQD Take 35m68m6 hrs PRN congestion and cough during daytime only. Patient not taking: Reported on 03/08/2017 01/14/17   SquiEppie Gibson  SUMAtriptan (IMITREX) 100 MG tablet Take 0.5-1 tablet by mouth at the onset of migraine and may repeat in 2 hours if headache persists or recurs. Patient not taking: Reported on 03/08/2017 05/14/16   CaloGolden CircleP  tiZANidine (ZANAFLEX) 4 MG tablet TAKE 1 TABLET BY MOUTH EVERY 6 HOURS AS  NEEDED Patient not taking: Reported on 03/08/2017 05/12/16   Jessy Oto, MD  traMADol (ULTRAM) 50 MG tablet Take 1 tablet (50 mg total) by mouth every 6 (six) hours as needed. Patient not taking: Reported on 03/08/2017 01/15/17   Dorie Rank, MD    Family History Family History  Problem Relation Age of Onset  . Heart disease Father   . Heart attack Father   . Hypertension Mother   . Arthritis Mother   . Diabetes Maternal Grandmother   . Diabetes Paternal Grandmother   . Diabetes Maternal Uncle     Social History Social History   Tobacco Use  . Smoking status: Never Smoker  . Smokeless tobacco: Never Used  Substance Use Topics  . Alcohol use: Yes    Alcohol/week: 4.2 oz    Types: 7 Cans of beer per week    Comment: 1 beer daily  . Drug use: No     Allergies   Other and Sulfur   Review of Systems Review of Systems  Constitutional: Negative for chills, diaphoresis and fever.  Respiratory: Negative for shortness of breath.   Cardiovascular: Negative for chest pain.  Gastrointestinal: Negative for abdominal pain, diarrhea, nausea and vomiting.  Genitourinary: Negative for decreased urine volume, difficulty urinating,  dysuria, frequency and hematuria.  Neurological: Negative for dizziness and light-headedness.  All other systems reviewed and are negative.    Physical Exam Updated Vital Signs BP 111/86   Pulse 92   Temp 98.3 F (36.8 C)   Resp 15   LMP 11/15/2013   SpO2 100%   Physical Exam  Constitutional: She appears well-developed and well-nourished. No distress.  HENT:  Head: Normocephalic and atraumatic.  Eyes: Conjunctivae are normal.  Neck: Neck supple.  Cardiovascular: Normal rate, regular rhythm, normal heart sounds and intact distal pulses.  Pulmonary/Chest: Effort normal and breath sounds normal. No respiratory distress.  Abdominal: Soft. There is no tenderness. There is no guarding and no CVA tenderness.  Musculoskeletal: She exhibits no edema.  Lymphadenopathy:    She has no cervical adenopathy.  Neurological: She is alert.  Skin: Skin is warm and dry. She is not diaphoretic.  Psychiatric: She has a normal mood and affect. Her behavior is normal.  Nursing note and vitals reviewed.    ED Treatments / Results  Labs (all labs ordered are listed, but only abnormal results are displayed) Labs Reviewed  CBC WITH DIFFERENTIAL/PLATELET - Abnormal; Notable for the following components:      Result Value   Hemoglobin 11.5 (*)    HCT 35.8 (*)    All other components within normal limits  BASIC METABOLIC PANEL - Abnormal; Notable for the following components:   BUN 24 (*)    All other components within normal limits  URINALYSIS, ROUTINE W REFLEX MICROSCOPIC - Abnormal; Notable for the following components:   Color, Urine AMBER (*)    APPearance HAZY (*)    Leukocytes, UA TRACE (*)    Squamous Epithelial / LPF 0-5 (*)    All other components within normal limits    EKG  EKG Interpretation None       Radiology No results found.  Procedures Procedures (including critical care time)  Medications Ordered in ED Medications  sodium chloride 0.9 % bolus 1,000 mL (0 mLs  Intravenous Stopped 03/08/17 2037)     Initial Impression / Assessment and Plan / ED Course  I have reviewed the triage vital signs and the nursing notes.  Pertinent labs &  imaging results that were available during my care of the patient were reviewed by me and considered in my medical decision making (see chart for details).  Clinical Course as of Mar 08 2128  Tue Mar 08, 2017  1942 Consistent with previous values. BUN: (!) 24 [SJ]    Clinical Course User Index [SJ] Joy, Shawn C, PA-C    Patient presents with reported elevated creatinine above her normal baseline.  Creatinine value here in the ED is within normal range.  Patient remains symptom-free throughout ED course.  PCP follow-up on this matter.  Return precautions discussed.  Patient voices understanding of all instructions and is comfortable discharge.   Vitals:   03/08/17 1046 03/08/17 1526 03/08/17 1845 03/08/17 2037  BP: 111/81 111/86 99/68 104/83  Pulse: 84 92 72 68  Resp: 15 15 17 16   Temp: 98.9 F (37.2 C) 98.3 F (36.8 C)    TempSrc: Oral     SpO2: 95% 100% 95% 98%     Final Clinical Impressions(s) / ED Diagnoses   Final diagnoses:  Creatinine elevation    ED Discharge Orders    None       Layla Maw 03/08/17 2132    Fatima Blank, MD 03/09/17 [phone]

## 2017-03-08 NOTE — Discharge Instructions (Signed)
Your creatinine level was 0.99 here in the ED today.  This is within the normal range for an adult in your age group.  You were given IV fluids.  Follow-up with your primary care provider on this matter.  Return to the ED as needed.

## 2017-03-08 NOTE — ED Triage Notes (Signed)
Pt was told by her PCP that she had abnormal kidney function levels on a recent blood test and would need to be evaluated in the ED/receive fluids. Pt complains of 3/10 bilateral back pain.

## 2017-03-09 ENCOUNTER — Other Ambulatory Visit: Payer: Self-pay | Admitting: Family Medicine

## 2017-03-09 ENCOUNTER — Encounter: Payer: Self-pay | Admitting: Family Medicine

## 2017-03-09 DIAGNOSIS — C01 Malignant neoplasm of base of tongue: Secondary | ICD-10-CM

## 2017-03-10 ENCOUNTER — Ambulatory Visit: Payer: BC Managed Care – PPO | Attending: Radiation Oncology

## 2017-03-11 ENCOUNTER — Telehealth: Payer: Self-pay | Admitting: *Deleted

## 2017-03-11 NOTE — Telephone Encounter (Signed)
Oncology Nurse Navigator Documentation  Rec'd call from pt who reported upper palate soreness and throat irritation lasting ca 15 minutes after eating.  I encouraged viscous lidocaine as prescribed 20 minutes prior to meals, asked her to call if symptoms not relieved.  She voiced understanding.  Gayleen Orem, RN, BSN Head & Neck Oncology Nurse [facility] [phone]

## 2017-03-21 ENCOUNTER — Ambulatory Visit: Payer: BC Managed Care – PPO | Attending: Radiation Oncology

## 2017-03-21 ENCOUNTER — Ambulatory Visit: Payer: BC Managed Care – PPO | Admitting: Nutrition

## 2017-03-21 NOTE — Progress Notes (Signed)
Patient stopped by my office. Reports her weight has decreased to 201 pounds on home scale. She reports poor appetite. She has decreased her tube feedings however feels like she needs to increase total bottles again.  She is requesting additional Osmolite 1.5 samples.  Recommended patient increase Osmolite 1.5-1-1/2 bottles 3 times daily in addition to food intake as tolerated. Provided samples of Osmolite 1.5. Patient will follow-up as needed.  **Disclaimer: This note was dictated with voice recognition software. Similar sounding words can inadvertently be transcribed and this note may contain transcription errors which may not have been corrected upon publication of note.**

## 2017-03-22 ENCOUNTER — Telehealth: Payer: Self-pay | Admitting: Family Medicine

## 2017-03-22 DIAGNOSIS — K580 Irritable bowel syndrome with diarrhea: Secondary | ICD-10-CM

## 2017-03-22 NOTE — Telephone Encounter (Signed)
Copied from [place] [id number]. Topic: Quick Communication - Rx Refill/Question >> Mar 22, 2017  1:21 PM Cleaster Corin, [place] wrote: Medication:  Eluxadoline (VIBERZI) 100 MG TABS [[id number]]  Has the patient contacted their pharmacy? yes   (Agent: If no, request that the patient contact the pharmacy for the refill.)   Preferred Pharmacy (with phone number or street name): [place], [place] [address] Phone: [phone] Fax: [phone]     Agent: Please be advised that RX refills may take up to 3 business days. We ask that you follow-up with your pharmacy.

## 2017-03-23 MED ORDER — ELUXADOLINE 100 MG PO TABS
100.0000 mg | ORAL_TABLET | Freq: Two times a day (BID) | ORAL | 3 refills | Status: DC
Start: 2017-03-23 — End: 2017-10-27

## 2017-03-28 ENCOUNTER — Encounter (INDEPENDENT_AMBULATORY_CARE_PROVIDER_SITE_OTHER): Payer: Self-pay | Admitting: Surgery

## 2017-03-28 ENCOUNTER — Other Ambulatory Visit (INDEPENDENT_AMBULATORY_CARE_PROVIDER_SITE_OTHER): Payer: Self-pay | Admitting: Surgery

## 2017-03-28 NOTE — Telephone Encounter (Signed)
Heather with [facility] called to request an RX refill on the patient's  Gabapentin 35m.  CB#[phone].  Thank you.

## 2017-03-28 NOTE — Telephone Encounter (Signed)
Sorry, I can find  no record of me ever treating this pt.

## 2017-03-28 NOTE — Telephone Encounter (Signed)
Ok for refill? 

## 2017-03-29 ENCOUNTER — Institutional Professional Consult (permissible substitution): Payer: BC Managed Care – PPO | Admitting: Pulmonary Disease

## 2017-03-29 MED ORDER — TRAMADOL HCL 50 MG PO TABS: 50.0000 mg | ORAL_TABLET | Freq: Two times a day (BID) | ORAL | 0 refills | Status: DC | PRN

## 2017-03-29 NOTE — Telephone Encounter (Signed)
This is a Dr. Louanne Skye patient. Jeneen Rinks has treated the last few times.

## 2017-03-30 NOTE — Telephone Encounter (Signed)
Called rx to [facility]

## 2017-04-06 ENCOUNTER — Encounter: Payer: Self-pay | Admitting: Family Medicine

## 2017-04-06 ENCOUNTER — Other Ambulatory Visit: Payer: Self-pay | Admitting: Family Medicine

## 2017-04-06 MED ORDER — GABAPENTIN 300 MG PO CAPS: 300.0000 mg | ORAL_CAPSULE | Freq: Three times a day (TID) | ORAL | 2 refills | Status: DC

## 2017-04-11 ENCOUNTER — Ambulatory Visit (HOSPITAL_COMMUNITY)
Admission: RE | Admit: 2017-04-11 | Discharge: 2017-04-11 | Disposition: A | Discharge status: Home / Self Care | Payer: BC Managed Care – PPO | Source: Ambulatory Visit | Attending: Radiology | Admitting: Radiology

## 2017-04-11 ENCOUNTER — Other Ambulatory Visit (HOSPITAL_COMMUNITY): Payer: Self-pay | Admitting: Radiology

## 2017-04-11 ENCOUNTER — Encounter (HOSPITAL_COMMUNITY): Payer: Self-pay | Admitting: Radiology

## 2017-04-11 DIAGNOSIS — Z431 Encounter for attention to gastrostomy: Secondary | ICD-10-CM | POA: Insufficient documentation

## 2017-04-11 DIAGNOSIS — R633 Feeding difficulties, unspecified: Secondary | ICD-10-CM

## 2017-04-11 HISTORY — PX: IR PATIENT EVAL TECH 0-60 MINS: IMG5564

## 2017-04-11 NOTE — Procedures (Signed)
Patient came in today with complaint of g tube plug barely attached and breaking in the g tube.  She had a lopez valve placed when she was here last that she would like replaced.  I was able to open and remove the plug without it breaking inside the tube.  I added a lopez valve and cut the plug off so it could not be used in the future since it was barely attached.  She was satisfied with the results.  She was advised to call us if she has any additional problems or concerns

## 2017-04-18 ENCOUNTER — Ambulatory Visit: Payer: BC Managed Care – PPO | Attending: Radiation Oncology

## 2017-04-18 DIAGNOSIS — R1312 Dysphagia, oropharyngeal phase: Secondary | ICD-10-CM

## 2017-04-18 NOTE — Therapy (Signed)
[facility] [address] [place], [place], [address] Phone: [phone]   Fax:  [phone]  Speech Language Pathology Treatment  Patient Details  Name: Kristen [name] MRN: [id number] Date of Birth: 29-Oct-1965 Referring Provider: Eppie Gibson, MD   Encounter Date: 04/18/2017  End of Session - 04/18/17 1659    Visit Number  4    Number of Visits  7    Date for SLP Re-Evaluation  06/10/17    SLP Start Time  1024    SLP Stop Time   1104    SLP Time Calculation (min)  40 min    Activity Tolerance  Patient tolerated treatment well       Past Medical History:  Diagnosis Date  . Allergy   . Anemia    Iron deficiency  . Anxiety   . Arthritis    back, left shoulder  . Chicken pox   . Depression   . Diarrhea    takes Imodium daily  . GERD (gastroesophageal reflux disease)   . H/O hiatal hernia   . Headache(784.0)   . History of kidney stones    1996ish  . History of radiation therapy 11/16/16- 01/01/17   Base of Tongue/ 66 gy in 33 fractions/ Dose: 2 Gy  . Hypertension   . Migraine    none for 5 years (as of 01/13/16)  . OSA (obstructive sleep apnea) 10/13/2015   unable to get cpap, plans to get one in 2018  . Pneumonia   . Restless legs   . Scoliosis   . Shortness of breath    with exertion    Past Surgical History:  Procedure Laterality Date  . BACK SURGERY  07/21/1978  . CHOLECYSTECTOMY N/A 08/15/2013   Procedure: LAPAROSCOPIC CHOLECYSTECTOMY WITH INTRAOPERATIVE CHOLANGIOGRAM;  Surgeon: Gwenyth Ober, MD;  Location: [facility];  Service: General;  Laterality: N/A;  . COLONOSCOPY N/A 01/09/2013   Procedure: COLONOSCOPY;  Surgeon: Beryle Beams, MD;  Location: [facility];  Service: Endoscopy;  Laterality: N/A;  . DIRECT LARYNGOSCOPY  07/2016   Dr. Nicolette Bang [facility]  . GASTROSTOMY TUBE PLACEMENT  09/27/2016  . HERNIA REPAIR Left 1981  . IR PATIENT EVAL TECH 0-60 MINS  12/13/2016  . IR PATIENT EVAL TECH  0-60 MINS  04/11/2017  . MODIFIED RADICAL NECK DISSECTION Left 09/27/2016   Levels 1 & 2  . PARTIAL GLOSSECTOMY Left 09/27/2016   Left hemi partial glossectomy  . TONSILLECTOMY    . tracheotomy  09/27/2016  . TUBAL LIGATION  06/1988    There were no vitals filed for this visit.  Subjective Assessment - 04/18/17 1051    Subjective  Pt reports Dr. Nicolette Bang last month (March) and he recommended modified due to nasal regurgitation.    Currently in Pain?  No/denies            ADULT SLP TREATMENT - 04/18/17 1052      General Information   Behavior/Cognition  Cooperative;Pleasant mood;Alert      Treatment Provided   Treatment provided  Dysphagia      Dysphagia Treatment   Temperature Spikes Noted  No    Respiratory Status  Room air    Oral Cavity - Dentition  Edentulous    Treatment Methods  Skilled observation;Therapeutic exercise;Compensation strategy training    Patient observed directly with PO's  Yes    Type of PO's observed  Dysphagia 3 (soft);Thin liquids    Oral Phase Signs & Symptoms  Prolonged mastication  Pharyngeal Phase Signs & Symptoms  Delayed throat clear;Complaints of residue on 25% boluses    Other treatment/comments  Pt with 4 cans via PEG each day. SLP encouraged pt to have more POs daily. Pt reports more pronounced oral sensations of taste and "feel" -  pt will now often gag and expectorate sausage or ground meat due to "grittiness" of it. Pt's bil neck under mandible appears slightly fibrotic (right more than left) at this time. Pt reports solids (lunch meat, today) "stuck" at the base of tongue but dys I items reported do not have difficulty with pharyngeal clearance. Due to suspected fibrosis on rt, SLP had pt use head turn to rt and pt stated POs (small bite lunch meat, and water) passed better through pharynx. SLP told pt to use head turn to rt with all POs. HEP - [place]-pt is performing wiht open mouth but tongue protruded. SLP provided pt with overt s/s  aspiration PNA and pt provided 3 overt s/s to SLP today, verbally.       Assessment / Recommendations / Plan   Plan  Continue with current plan of care pt prefers MBSS here in [facility]      Dysphagia Recommendations   Diet recommendations  Dysphagia 2 (fine chop);Dysphagia 3 (mechanical soft);Thin liquid diet as best tolerated    Liquids provided via  Cup    Medication Administration  -- as tolerated    Supervision  Patient able to self feed    Compensations  Small sips/bites;Slow rate;Follow solids with liquid    Postural Changes and/or Swallow Maneuvers  Head turn right during swallow       SLP Education - 04/18/17 1658    Education provided  Yes    Education Details  head turn rt with all POs, diet as best tolerated    Person(s) Educated  Patient    Methods  Explanation;Demonstration    Comprehension  Verbalized understanding;Returned demonstration       SLP Short Term Goals - 04/18/17 1659      SLP SHORT TERM GOAL #1   Title  pt will complete HEP with rare min A over two sessions    Status  Partially Met one visist      SLP SHORT TERM GOAL #2   Title  pt will tell SLP why she is completing HEP     Status  Achieved      SLP SHORT TERM GOAL #3   Title  pt will tell why a food journal can be helpful in returning to pre-radiation diet    Status  Deferred      SLP SHORT TERM GOAL #4   Title  pt will provide SLP 3 overt s/s aspiration PNA with modified independence    Status  Achieved       SLP Long Term Goals - 04/18/17 1700      SLP LONG TERM GOAL #1   Title  pt will complete HEP with modified independence over three sessions     Baseline  02-04-17, 04-18-17    Time  3    Period  -- visits    Status  On-going      SLP LONG TERM GOAL #2   Title  pt will tell SLP when HEP frequency can be reduced to x2-3/week    Time  3    Period  -- visits    Status  On-going      SLP LONG TERM GOAL #3   Title  pt will  tell SLP 3 overt s/s of aspiration PNA with modified  independence over three sessions    Baseline  04-18-17    Time  3    Period  -- visits    Status  On-going      SLP LONG TERM GOAL #4   Title  pt will tell how food journal can assist return to more normal POs    Time  3    Period  -- visits    Status  Revised       Plan - 04/18/17 1645    Clinical Impression Statement  Pt with pharyngeal clearance inconsistent with liquid wash when provided small bites dys III and thin, better with head turn to rt, but reportedly not totally alleviating residuals. Pt reports she has been completing HEP as directed and today req'd only SBA with one exercise. Pt's MD at [facility] ordered MBSS due to ongoing dysphagia and nasal regurgitation which pt would like to have performed here, SLP informed pt's rad oncologist of this. The probability of swallowing difficulty remains high with the initiation of radiation therapy. Pt does not report any overt s/s aspiration PNA to date. Pt will need to cont to be followed by SLP for regular assessment of accurate HEP completion as well as for safety with POs both during and following treatment/s.    Speech Therapy Frequency  -- approx once every 4 weeks    Duration  -- 6 visits    Treatment/Interventions  Aspiration precaution training;Pharyngeal strengthening exercises;Diet toleration management by SLP;Trials of upgraded texture/liquids;Internal/external aids;Patient/family education;Compensatory strategies;SLP instruction and feedback;Cueing hierarchy;Environmental controls    Potential to Achieve Goals  Good       Patient will benefit from skilled therapeutic intervention in order to improve the following deficits and impairments:   Dysphagia, oropharyngeal    Problem List Patient Active Problem List   Diagnosis Date Noted  . Hypotension 02/10/2017  . 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 Malignant neoplasm of base of tongue ([place]) 10/29/2016  . 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[place])  10/29/2016  . Headache associated with sexual activity 05/14/2016  . Tongue lesion 05/14/2016  . Loose stools 03/12/2016  . Hypocalcemia 01/22/2016  . Thrombocytopenia ([place]) 01/22/2016  . Chronic diarrhea 01/22/2016  . Chest pain   . Essential hypertension   . Orthostatic hypotension   . Spinal stenosis of lumbar region 01/20/2016    Class: Chronic  . DDD (degenerative disc disease), lumbar 01/20/2016    Class: Chronic  . Spinal stenosis of lumbar region with neurogenic claudication 01/20/2016  . Low back pain 12/22/2015  . Pre-op examination 12/22/2015  . OSA (obstructive sleep apnea) 10/13/2015  . Muscle cramp 08/01/2015  . GERD (gastroesophageal reflux disease) 07/09/2015  . Excessive daytime sleepiness 06/18/2015  . Shingles 08/28/2013  . Postop check 08/28/2013  . Cholelithiasis 07/10/2013  . Colitis 01/08/2013  . Postoperative anemia 01/08/2013  . Morbid obesity with BMI of 40.0-44.9, adult ([place]) 01/08/2013    [facility] ,[place], [place]  04/18/2017, 5:15 PM  [facility] [address] [place], [place], [address] Phone: [phone]   Fax:  [phone]   Name: Kristen [name] MRN: [id number] Date of Birth: Jul 09, 1965

## 2017-04-18 NOTE — Patient Instructions (Signed)
Turn your head to the right when you swallow anything by mouth.

## 2017-04-19 ENCOUNTER — Other Ambulatory Visit: Payer: Self-pay | Admitting: Radiation Oncology

## 2017-04-19 DIAGNOSIS — C760 Malignant neoplasm of head, face and neck: Secondary | ICD-10-CM

## 2017-04-20 ENCOUNTER — Telehealth: Payer: Self-pay | Admitting: *Deleted

## 2017-04-20 ENCOUNTER — Other Ambulatory Visit (HOSPITAL_COMMUNITY): Payer: Self-pay | Admitting: Radiation Oncology

## 2017-04-20 DIAGNOSIS — R1319 Other dysphagia: Secondary | ICD-10-CM

## 2017-04-20 NOTE — Telephone Encounter (Signed)
Called patient to inform of MBS - arrival time- 12:45 pm @ [facility], spoke with patient and she is aware of this test

## 2017-04-21 ENCOUNTER — Ambulatory Visit (HOSPITAL_COMMUNITY)
Admission: RE | Admit: 2017-04-21 | Discharge: 2017-04-21 | Disposition: A | Discharge status: Home / Self Care | Payer: BC Managed Care – PPO | Source: Ambulatory Visit | Attending: Radiation Oncology | Admitting: Radiation Oncology

## 2017-04-21 DIAGNOSIS — R1319 Other dysphagia: Secondary | ICD-10-CM | POA: Diagnosis present

## 2017-04-21 DIAGNOSIS — C760 Malignant neoplasm of head, face and neck: Secondary | ICD-10-CM | POA: Insufficient documentation

## 2017-05-03 ENCOUNTER — Other Ambulatory Visit (INDEPENDENT_AMBULATORY_CARE_PROVIDER_SITE_OTHER): Payer: Self-pay | Admitting: Orthopaedic Surgery

## 2017-05-04 ENCOUNTER — Encounter: Payer: Self-pay | Admitting: Internal Medicine

## 2017-05-04 ENCOUNTER — Ambulatory Visit (INDEPENDENT_AMBULATORY_CARE_PROVIDER_SITE_OTHER): Payer: BC Managed Care – PPO | Admitting: Internal Medicine

## 2017-05-04 DIAGNOSIS — C0680 Malignant neoplasm of overlapping sites of unspecified parts of mouth: Secondary | ICD-10-CM

## 2017-05-04 DIAGNOSIS — G4733 Obstructive sleep apnea (adult) (pediatric): Secondary | ICD-10-CM

## 2017-05-04 NOTE — Assessment & Plan Note (Signed)
She is being actively followed by ENT in [place].

## 2017-05-04 NOTE — Progress Notes (Signed)
05/04/17-52 year old female never smoker for sleep evaluation.  Had seen Dr. Halford Chessman in 2017 with symptoms consistent with OSA and was to have a home sleep test. 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 Has G-tube. Epworth 10 Medical problem list includes degenerative disc disease, lumbar spinal stenosis, chronic diarrhea/colitis, HBP, GERD,  Weight now 194 pounds, room air saturation 99% at rest ----Sleep follow up-seen by [facility] in 08-2015; had sleep study and was not able to get set up with CPAP.  Over the last 2 years, starting before treatment for her cancer in 2018, she has been aware of excessive daytime sleepiness.  Working as a Air cabin crew on a schoolbus.  Some days she will fall asleep while riding and is worried about her job.  Feels that she sleeps well.  Has been told that she snores but not witnessed apnea.  Occasional naps.  No caffeine except an occasional Pepsi. Bedtime between 10 and 11 PM, may wake once before up at 5 AM. Recorded weight loss 73 pounds since treatment for mouth cancer in 2018.  Can eat some by mouth but most nutrition is by G-tube.  Prior to Admission medications   Medication Sig Start Date End Date Taking? Authorizing Provider  amLODipine (NORVASC) 10 MG tablet Take 1 tablet (10 mg total) daily by mouth. 11/30/16  Yes [name], MD  aspirin 81 MG chewable tablet 81 mg. 10/05/16  Yes [provider]  calcium carbonate (OSCAL) 1500 (600 Ca) MG TABS tablet Take by mouth 2 (two) times daily with a meal.   Yes [provider]  diclofenac (VOLTAREN) 75 MG EC tablet TAKE ONE TABLET BY MOUTH TWICE DAILY WITH FOOD AS NEEDED FOR PAIN Patient taking differently: 75 mg. TAKE ONE TABLET BY MOUTH TWICE DAILY WITH FOOD AS NEEDED FOR PAIN 11/22/16  Yes Jessy Oto, MD  Doxylamine-DM (VICKS NYQUIL COUGH) 6.25-15 MG/15ML LIQD Take 17m before  bedtime for cold/cough. 01/14/17  Yes SEppie Gibson MD  Eluxadoline (VIBERZI) 100 MG TABS Take 1 tablet (100 mg total) by mouth 2 (two) times daily. 03/23/17  Yes BBillie Ruddy MD  ergocalciferol (DRISDOL) 8000 UNIT/ML drops Place 6 mLs (48,000 Units total) into feeding tube once a week. 03/04/17  Yes BBillie Ruddy MD  gabapentin (NEURONTIN) 300 MG capsule Take 1 capsule (300 mg total) by mouth 3 (three) times daily. 04/06/17  Yes BBillie Ruddy MD  HYDROcodone-acetaminophen (NORCO/VICODIN) 5-325 MG tablet Take 1-2 tablets by mouth every 4 (four) hours as needed for moderate pain. Taper as pain improves. 01/14/17  Yes SEppie Gibson MD  indomethacin (INDOCIN) 25 MG capsule Take 1 capsule by mouth 30-60 minutes prior to sexual activity. 05/14/16  Yes CGolden Circle FNP  lidocaine (XYLOCAINE) 2 % solution Mix 1 part 2%viscous lidocaine,1part H2O.Swish and/or swallow 171mof this mixture,3067mbefore meals and at bedtime, up to QID 11/22/16  Yes SquEppie GibsonD  LORazepam (ATIVAN) 0.5 MG tablet Take 1 tab PRN nausea. Do not take more than 3 pills daily. 12/31/16  Yes SquEppie GibsonD  pantoprazole (PROTONIX) 40 MG tablet Take 1 tablet (40 mg total) daily by mouth. 11/30/16  Yes BanBillie RuddyD  Phenylephrine-Guaifenesin 7.5-100 MG/5ML LIQD Take 64m59m6 hrs PRN congestion and cough during daytime only. 01/14/17  Yes SquiEppie Gibson  Potassium 99 MG TABS 1 tablet by Gastrostomy Tube route daily. 11/30/16  Yes  [name], MD  SUMAtriptan (IMITREX) 100 MG tablet Take 0.5-1 tablet by mouth at the onset of migraine and may repeat in 2 hours if headache persists or recurs. 05/14/16  Yes Golden Circle, FNP  tiZANidine (ZANAFLEX) 4 MG tablet TAKE 1 TABLET BY MOUTH EVERY 6 HOURS AS NEEDED 05/12/16  Yes Jessy Oto, MD  traMADol (ULTRAM) 50 MG tablet Take 1 tablet (50 mg total) by mouth every 6 (six) hours as needed. 01/15/17  Yes Dorie Rank, MD   Past Medical History:  Diagnosis  Date  . Allergy   . Anemia    Iron deficiency  . Anxiety   . Arthritis    back, left shoulder  . Chicken pox   . Depression   . Diarrhea    takes Imodium daily  . GERD (gastroesophageal reflux disease)   . H/O hiatal hernia   . Headache(784.0)   . History of kidney stones    1996ish  . History of radiation therapy 11/16/16- 01/01/17   Base of Tongue/ 66 gy in 33 fractions/ Dose: 2 Gy  . Hypertension   . Migraine    none for 5 years (as of 01/13/16)  . OSA (obstructive sleep apnea) 10/13/2015   unable to get cpap, plans to get one in 2018  . Pneumonia   . Restless legs   . Scoliosis   . Shortness of breath    with exertion   Past Surgical History:  Procedure Laterality Date  . BACK SURGERY  07/21/1978  . CHOLECYSTECTOMY N/A 08/15/2013   Procedure: LAPAROSCOPIC CHOLECYSTECTOMY WITH INTRAOPERATIVE CHOLANGIOGRAM;  Surgeon: Gwenyth Ober, MD;  Location: [facility];  Service: General;  Laterality: N/A;  . COLONOSCOPY N/A 01/09/2013   Procedure: COLONOSCOPY;  Surgeon: Beryle Beams, MD;  Location: [facility];  Service: Endoscopy;  Laterality: N/A;  . DIRECT LARYNGOSCOPY  07/2016   Dr. Nicolette Bang [facility]  . GASTROSTOMY TUBE PLACEMENT  09/27/2016  . HERNIA REPAIR Left 1981  . IR PATIENT EVAL TECH 0-60 MINS  12/13/2016  . IR PATIENT EVAL TECH 0-60 MINS  04/11/2017  . MODIFIED RADICAL NECK DISSECTION Left 09/27/2016   Levels 1 & 2  . PARTIAL GLOSSECTOMY Left 09/27/2016   Left hemi partial glossectomy  . TONSILLECTOMY    . tracheotomy  09/27/2016  . TUBAL LIGATION  06/1988   Family History  Problem Relation Age of Onset  . Heart disease Father   . Heart attack Father   . Hypertension Mother   . Arthritis Mother   . Diabetes Maternal Grandmother   . Diabetes Paternal Grandmother   . Diabetes Maternal Uncle    Social History   Socioeconomic History  . Marital status: Legally Separated    Spouse name: Not on file  . Number of children: 3  . Years of education: 24  . Highest  education level: Not on file  Occupational History  . Occupation: Air cabin crew  Social Needs  . Financial resource strain: Not on file  . Food insecurity:    Worry: Not on file    Inability: Not on file  . Transportation needs:    Medical: Not on file    Non-medical: Not on file  Tobacco Use  . Smoking status: Never Smoker  . Smokeless tobacco: Never Used  Substance and Sexual Activity  . Alcohol use: Yes    Alcohol/week: 4.2 oz    Types: 7 Cans of beer per week    Comment: 1 beer daily  . Drug  use: No  . Sexual activity: Yes    Birth control/protection: Surgical  Lifestyle  . Physical activity:    Days per week: Not on file    Minutes per session: Not on file  . Stress: Not on file  Relationships  . Social connections:    Talks on phone: Not on file    Gets together: Not on file    Attends religious service: Not on file    Active member of club or organization: Not on file    Attends meetings of clubs or organizations: Not on file    Relationship status: Not on file  . Intimate partner violence:    Fear of current or ex partner: Not on file    Emotionally abused: Not on file    Physically abused: Not on file    Forced sexual activity: Not on file  Other Topics Concern  . Not on file  Social History Narrative   Fun: Play with her grandkids, read   ROS-see HPI   Negative unless "+" Constitutional:    weight loss, night sweats, fevers, chills, fatigue, lassitude. HEENT:    headaches, difficulty swallowing, tooth/dental problems, sore throat,       sneezing, itching, ear ache, nasal congestion, post nasal drip, snoring CV:    chest pain, orthopnea, PND, swelling in lower extremities, anasarca,                                  dizziness, palpitations Resp:   shortness of breath with exertion or at rest.                productive cough,   non-productive cough, coughing up of blood.              change in color of mucus.  wheezing.   Skin:    rash or lesions. GI:   No-   heartburn, indigestion, abdominal pain, nausea, vomiting, diarrhea,                 change in bowel habits, loss of appetite GU: dysuria, change in color of urine, no urgency or frequency.   flank pain. MS:   joint pain, stiffness, decreased range of motion, back pain. Neuro-     nothing unusual Psych:  change in mood or affect.  depression or anxiety.   memory loss.  OBJ- Physical Exam General- Alert, Oriented, Affect-appropriate, Distress- none acute, + overweight Skin- rash-none, lesions- none, excoriation- none Lymphadenopathy- none Head- atraumatic            Eyes- Gross vision intact, PERRLA, conjunctivae and secretions clear            Ears- Hearing, canals-normal            Nose- Clear, no-Septal dev, mucus, polyps, erosion, perforation             Throat- Mallampati III-IV , mucosa clear , drainage- none, tonsils- atrophic, + edentulous                       +speech is fairly clear.  I did not see obvious surgical changes. Neck- flexible , trachea midline, no stridor , thyroid nl, carotid no bruit Chest - symmetrical excursion , unlabored           Heart/CV- RRR , no murmur , no gallop  , no rub, nl s1 s2                           -  JVD- none , edema- none, stasis changes- none, varices- none           Lung- clear to P&A, wheeze- none, cough- none , dullness-none, rub- none           Chest wall-  Abd-   + Gastric feeding tube Br/ Gen/ Rectal- Not done, not indicated Extrem- cyanosis- none, clubbing, none, atrophy- none, strength- nl Neuro- grossly intact to observation    CXR 02/10/17 Low lung volumes, bibasilar atelectasis.  Mild cardiomegaly.

## 2017-05-04 NOTE — Patient Instructions (Addendum)
Order- please order unattended home sleep test   Dx OSA  Please call me about 2 weeks after the sleep test, for results and recommendations. If appropriate, we may be able to start treatment before I see you next.

## 2017-05-04 NOTE — Assessment & Plan Note (Signed)
Previously had mild sleep apnea with subsequent significant weight loss and complicating issue of oropharyngeal surgery/XRT for cancer.  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 Plan-schedule sleep study.  We will treat significant sleep apnea.  It may be appropriate to treat daytime somnolence as a separate issue, with Ritalin or Adderall.

## 2017-05-05 ENCOUNTER — Telehealth: Payer: Self-pay | Admitting: *Deleted

## 2017-05-05 ENCOUNTER — Other Ambulatory Visit (INDEPENDENT_AMBULATORY_CARE_PROVIDER_SITE_OTHER): Payer: Self-pay | Admitting: Orthopaedic Surgery

## 2017-05-05 NOTE — Progress Notes (Signed)
Ms. Battenfield presents for follow up of radiation completed 01/01/17 to her Tongue.   Pain issues, if any: She reports central chest pain which radiated to the right that improved after she got up.  Using a feeding tube?: Yes, she uses it for medicine and 3 cans of nutritional supplement daily  Weight changes, if any: She is concerned about her recent weight loss. She tells me that she is eating well and doesn't understand why she has lost weight. She reports eating once or twice daily however.  Wt Readings from Last 3 Encounters:  05/13/17 188 lb 3.2 oz (85.4 kg)  05/07/17 194 lb (88 kg)  05/04/17 194 lb (88 kg)   Swallowing issues, if any: She is swallowing most foods. She cannot eat certain meats like hamburger and sausage, it becomes gritty in her mouth. She does tell me that after she has eaten any type of food, it feels as if her throat is closing and she cannot swallow anymore.  Smoking or chewing tobacco? No Using fluoride trays daily? N/A Last ENT visit was on: Dr. Nicolette Bang 03/29/17, and will see again in May.  Other notable issues, if any:  She has a dry mouth when she wakes up.    BP 114/84   Pulse 68   Temp 98.6 F (37 C)   Ht 5' 9"  (1.753 m)   Wt 188 lb 3.2 oz (85.4 kg)   LMP 11/15/2013   SpO2 99% Comment: room air  BMI 27.79 kg/m

## 2017-05-05 NOTE — Telephone Encounter (Signed)
yes

## 2017-05-05 NOTE — Telephone Encounter (Signed)
Oncology Nurse Navigator Documentation  Confirmed with pt she has not had or does not have post-tmt imaging scheduled with Dr. Nicolette Bang, [facility]. I sent e-mail follow-up to Dr. Nicolette Bang via Navigator Fransico Setters, requested guidance.  Gayleen Orem, RN, BSN Head & Neck Oncology Nurse [facility] [place] [phone]

## 2017-05-07 ENCOUNTER — Encounter (HOSPITAL_COMMUNITY): Payer: Self-pay | Admitting: Emergency Medicine

## 2017-05-07 ENCOUNTER — Other Ambulatory Visit: Payer: Self-pay

## 2017-05-07 ENCOUNTER — Emergency Department (HOSPITAL_COMMUNITY): Payer: BC Managed Care – PPO

## 2017-05-07 ENCOUNTER — Emergency Department (HOSPITAL_COMMUNITY)
Admission: EM | Admit: 2017-05-07 | Discharge: 2017-05-07 | Disposition: A | Discharge status: Home / Self Care | Payer: BC Managed Care – PPO | Attending: Emergency Medicine | Admitting: Emergency Medicine

## 2017-05-07 DIAGNOSIS — K9423 Gastrostomy malfunction: Secondary | ICD-10-CM | POA: Diagnosis not present

## 2017-05-07 DIAGNOSIS — Z789 Other specified health status: Secondary | ICD-10-CM

## 2017-05-07 DIAGNOSIS — Z7982 Long term (current) use of aspirin: Secondary | ICD-10-CM | POA: Diagnosis not present

## 2017-05-07 DIAGNOSIS — Z79899 Other long term (current) drug therapy: Secondary | ICD-10-CM | POA: Diagnosis not present

## 2017-05-07 DIAGNOSIS — I10 Essential (primary) hypertension: Secondary | ICD-10-CM | POA: Insufficient documentation

## 2017-05-07 DIAGNOSIS — T85528A Displacement of other gastrointestinal prosthetic devices, implants and grafts, initial encounter: Secondary | ICD-10-CM

## 2017-05-07 DIAGNOSIS — Z431 Encounter for attention to gastrostomy: Secondary | ICD-10-CM

## 2017-05-07 HISTORY — DX: Malignant (primary) neoplasm, unspecified: C80.1

## 2017-05-07 MED ORDER — IOHEXOL 300 MG/ML  SOLN
50.0000 mL | Freq: Once | INTRAMUSCULAR | Status: AC | PRN
  Administered 2017-05-07: 50 mL via ORAL

## 2017-05-07 NOTE — ED Triage Notes (Signed)
Pt comes in with complaints of her g-tube coming out at 0500. Site looks healthy. Patient has an 86 F G-tube.

## 2017-05-07 NOTE — ED Provider Notes (Signed)
[facility] DEPT Provider Note   CSN: [id number] Arrival date & time: 05/07/17  7106     History   Chief Complaint Chief Complaint  Patient presents with  . Feeding Tube Dislodged    HPI Kristen [name] is a 52 y.o. female.  HPI Patient reports her gastrostomy tube became dislodged at 5 AM this morning.  She has no associated abdominal pain.  She brought the tube with her it is an 70 [place] gastrostomy tube.  She reports she still needs the tube for additional nutrition. Past Medical History:  Diagnosis Date  . Allergy   . Anemia    Iron deficiency  . Anxiety   . Arthritis    back, left shoulder  . Cancer ([place])   . Chicken pox   . Depression   . Diarrhea    takes Imodium daily  . GERD (gastroesophageal reflux disease)   . H/O hiatal hernia   . Headache(784.0)   . History of kidney stones    1996ish  . History of radiation therapy 11/16/16- 01/01/17   Base of Tongue/ 66 gy in 33 fractions/ Dose: 2 Gy  . Hypertension   . Migraine    none for 5 years (as of 01/13/16)  . OSA (obstructive sleep apnea) 10/13/2015   unable to get cpap, plans to get one in 2018  . Pneumonia   . Restless legs   . Scoliosis   . Shortness of breath    with exertion    Patient Active Problem List   Diagnosis Date Noted  . Hypotension 02/10/2017  . 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 Malignant neoplasm of base of tongue ([place]) 10/29/2016  . 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 Headache associated with sexual activity 05/14/2016  . Tongue lesion 05/14/2016  . Loose stools 03/12/2016  . Hypocalcemia 01/22/2016  . Thrombocytopenia ([place]) 01/22/2016  . Chronic diarrhea 01/22/2016  . Chest pain   . Essential hypertension   . Orthostatic hypotension   . Spinal stenosis of lumbar region 01/20/2016    Class: Chronic  . DDD (degenerative disc disease), lumbar 01/20/2016    Class: Chronic  . Spinal stenosis  of lumbar region with neurogenic claudication 01/20/2016  . Low back pain 12/22/2015  . Pre-op examination 12/22/2015  . OSA (obstructive sleep apnea) 10/13/2015  . Muscle cramp 08/01/2015  . GERD (gastroesophageal reflux disease) 07/09/2015  . Excessive daytime sleepiness 06/18/2015  . Shingles 08/28/2013  . Postop check 08/28/2013  . Cholelithiasis 07/10/2013  . Colitis 01/08/2013  . Postoperative anemia 01/08/2013  . Morbid obesity with BMI of 40.0-44.9, adult ([place]) 01/08/2013    Past Surgical History:  Procedure Laterality Date  . BACK SURGERY  07/21/1978  . CHOLECYSTECTOMY N/A 08/15/2013   Procedure: LAPAROSCOPIC CHOLECYSTECTOMY WITH INTRAOPERATIVE CHOLANGIOGRAM;  Surgeon: Gwenyth Ober, MD;  Location: [facility];  Service: General;  Laterality: N/A;  . COLONOSCOPY N/A 01/09/2013   Procedure: COLONOSCOPY;  Surgeon: Beryle Beams, MD;  Location: [facility];  Service: Endoscopy;  Laterality: N/A;  . DIRECT LARYNGOSCOPY  07/2016   Dr. Nicolette Bang [facility]  . GASTROSTOMY TUBE PLACEMENT  09/27/2016  . HERNIA REPAIR Left 1981  . IR PATIENT EVAL TECH 0-60 MINS  12/13/2016  . IR PATIENT EVAL TECH 0-60 MINS  04/11/2017  . MODIFIED RADICAL NECK DISSECTION Left 09/27/2016   Levels 1 & 2  . PARTIAL GLOSSECTOMY Left 09/27/2016   Left hemi partial glossectomy  .  TONSILLECTOMY    . tracheotomy  09/27/2016  . TUBAL LIGATION  06/1988     OB History   None      Home Medications    Prior to Admission medications   Medication Sig Start Date End Date Taking? Authorizing Provider  amLODipine (NORVASC) 10 MG tablet Take 1 tablet (10 mg total) daily by mouth. 11/30/16   [name], MD  aspirin 81 MG chewable tablet 81 mg. 10/05/16   [provider]  calcium carbonate (OSCAL) 1500 (600 Ca) MG TABS tablet Take by mouth 2 (two) times daily with a meal.    [provider]  diclofenac (VOLTAREN) 75 MG EC tablet TAKE ONE TABLET BY MOUTH TWICE DAILY WITH FOOD AS NEEDED FOR  PAIN Patient taking differently: 75 mg. TAKE ONE TABLET BY MOUTH TWICE DAILY WITH FOOD AS NEEDED FOR PAIN 11/22/16   Jessy Oto, MD  diclofenac (VOLTAREN) 75 MG EC tablet TAKE 1 TABLET BY MOUTH TWICE DAILY WITH FOOD AS NEEDED FOR PAIN 05/05/17   Leandrew Koyanagi, MD  Doxylamine-DM (VICKS NYQUIL COUGH) 6.25-15 MG/15ML LIQD Take 74m before bedtime for cold/cough. 01/14/17   SEppie Gibson MD  Eluxadoline (VIBERZI) 100 MG TABS Take 1 tablet (100 mg total) by mouth 2 (two) times daily. 03/23/17   BBillie Ruddy MD  ergocalciferol (DRISDOL) 8000 UNIT/ML drops Place 6 mLs (48,000 Units total) into feeding tube once a week. 03/04/17   BBillie Ruddy MD  gabapentin (NEURONTIN) 300 MG capsule Take 1 capsule (300 mg total) by mouth 3 (three) times daily. 04/06/17   BBillie Ruddy MD  HYDROcodone-acetaminophen (NORCO/VICODIN) 5-325 MG tablet Take 1-2 tablets by mouth every 4 (four) hours as needed for moderate pain. Taper as pain improves. 01/14/17   SEppie Gibson MD  indomethacin (INDOCIN) 25 MG capsule Take 1 capsule by mouth 30-60 minutes prior to sexual activity. 05/14/16   CGolden Circle FNP  lidocaine (XYLOCAINE) 2 % solution Mix 1 part 2%viscous lidocaine,1part H2O.Swish and/or swallow 117mof this mixture,3024mbefore meals and at bedtime, up to QID 11/22/16   SquEppie GibsonD  LORazepam (ATIVAN) 0.5 MG tablet Take 1 tab PRN nausea. Do not take more than 3 pills daily. 12/31/16   SquEppie GibsonD  pantoprazole (PROTONIX) 40 MG tablet Take 1 tablet (40 mg total) daily by mouth. 11/30/16   BanBillie RuddyD  Phenylephrine-Guaifenesin 7.5-100 MG/5ML LIQD Take 78m56m6 hrs PRN congestion and cough during daytime only. 01/14/17   SquiEppie Gibson  Potassium 99 MG TABS 1 tablet by Gastrostomy Tube route daily. 11/30/16   BankBillie Ruddy  SUMAtriptan (IMITREX) 100 MG tablet Take 0.5-1 tablet by mouth at the onset of migraine and may repeat in 2 hours if headache persists or recurs. 05/14/16    CaloGolden CircleP  tiZANidine (ZANAFLEX) 4 MG tablet TAKE 1 TABLET BY MOUTH EVERY 6 HOURS AS NEEDED 05/12/16   NitkJessy Oto  traMADol (ULTRAM) 50 MG tablet Take 1 tablet (50 mg total) by mouth every 6 (six) hours as needed. 01/15/17   KnapDorie Rank    Family History Family History  Problem Relation Age of Onset  . Heart disease Father   . Heart attack Father   . Hypertension Mother   . Arthritis Mother   . Diabetes Maternal Grandmother   . Diabetes Paternal Grandmother   . Diabetes Maternal Uncle     Social History Social History   Tobacco  Use  . Smoking status: Never Smoker  . Smokeless tobacco: Never Used  Substance Use Topics  . Alcohol use: Yes    Alcohol/week: 4.2 oz    Types: 7 Cans of beer per week    Comment: 1 beer daily  . Drug use: No     Allergies   Other and Sulfur   Review of Systems Review of Systems Constitutional: No fever no chills no malaise GI: No vomiting no abdominal pain Respiratory: No difficulty breathing no chest pain  Physical Exam Updated Vital Signs BP 111/77 (BP Location: Left Arm)   Pulse 69   Temp 98.3 F (36.8 C) (Oral)   Resp 16   Ht 5' 9"  (1.753 m)   Wt 88 kg (194 lb)   LMP 11/15/2013   SpO2 99%   BMI 28.65 kg/m   Physical Exam  Constitutional: She is oriented to person, place, and time. She appears well-developed and well-nourished. No distress.  HENT:  Head: Normocephalic and atraumatic.  Eyes: EOM are normal.  Pulmonary/Chest: Effort normal.  Abdominal: Soft. She exhibits no distension. There is no tenderness. There is no guarding.  Gastrostomy site left upper abdomen with no drainage or discharge.  Normal-appearing granulation tissue.  No surrounding tissue erythema.  Musculoskeletal: Normal range of motion.  Neurological: She is alert and oriented to person, place, and time. She exhibits normal muscle tone. Coordination normal.  Skin: Skin is warm and dry.  Psychiatric: She has a normal mood and  affect.     ED Treatments / Results  Labs (all labs ordered are listed, but only abnormal results are displayed) Labs Reviewed - No data to display  EKG None  Radiology Dg Abdomen Peg Tube Location  Result Date: 05/07/2017 CLINICAL DATA:  Gastrojejunal tube placement. EXAM: ABDOMEN - 1 VIEW COMPARISON:  01/15/2017. FINDINGS: Contrast in the gastrostomy tube, stomach and proximal small bowel. No extravasated contrast is seen. Normal bowel gas pattern. Thoracolumbar scoliosis with fixation hardware and bone fusion. IMPRESSION: Gastrostomy tube tip in the stomach with normal opacification of the stomach and proximal small bowel without extravasation. Electronically Signed   By: Claudie Revering M.D.   On: 05/07/2017 11:08    Procedures FEEDING TUBE REPLACEMENT Date/Time: 05/07/2017 10:12 AM Performed by: Charlesetta Shanks, MD Authorized by: Charlesetta Shanks, MD  Consent: Verbal consent obtained. Consent given by: patient Patient identity confirmed: verbally with patient Indications: tube dislodged Local anesthesia used: no  Anesthesia: Local anesthesia used: no  Sedation: Patient sedated: no  Tube type: gastrostomy Patient position: recumbent Procedure type: replacement Tube size: 18 Fr Endoscope used: no Bulb inflation volume: 10 (ml) Bulb inflation fluid: normal saline Placement/position confirmation: x-ray Tube placement difficulty: none Patient tolerance: Patient tolerated the procedure well with no immediate complications    (including critical care time)  Medications Ordered in ED Medications  iohexol (OMNIPAQUE) 300 MG/ML solution 50 mL (50 mLs Oral Contrast Given 05/07/17 1108)     Initial Impression / Assessment and Plan / ED Course  I have reviewed the triage vital signs and the nursing notes.  Pertinent labs & imaging results that were available during my care of the patient were reviewed by me and considered in my medical decision making (see chart for  details).     Final Clinical Impressions(s) / ED Diagnoses   Final diagnoses:  Dislodged gastrostomy tube ([place])  Presents with a gastrostomy tube displaced this morning.  No other acute complaints.  Tube has been replaced.  X-ray confirms placement.  Patient discharged in good condition.  She is aware of management of her tube and follow-up plan.  ED Discharge Orders    None       Charlesetta Shanks, MD 05/07/17 1141

## 2017-05-10 ENCOUNTER — Encounter (INDEPENDENT_AMBULATORY_CARE_PROVIDER_SITE_OTHER): Payer: Self-pay | Admitting: Surgery

## 2017-05-13 ENCOUNTER — Ambulatory Visit
Admission: RE | Admit: 2017-05-13 | Discharge: 2017-05-13 | Disposition: A | Discharge status: Home / Self Care | Payer: BC Managed Care – PPO | Source: Ambulatory Visit | Attending: Radiation Oncology | Admitting: Radiation Oncology

## 2017-05-13 ENCOUNTER — Other Ambulatory Visit: Payer: Self-pay

## 2017-05-13 ENCOUNTER — Encounter: Payer: Self-pay | Admitting: Radiation Oncology

## 2017-05-13 ENCOUNTER — Telehealth: Payer: Self-pay

## 2017-05-13 ENCOUNTER — Encounter (INDEPENDENT_AMBULATORY_CARE_PROVIDER_SITE_OTHER): Payer: Self-pay | Admitting: Surgery

## 2017-05-13 VITALS — BP 114/84 | HR 68 | Temp 98.6°F | Ht 69.0 in | Wt 188.2 lb

## 2017-05-13 DIAGNOSIS — Z923 Personal history of irradiation: Secondary | ICD-10-CM | POA: Insufficient documentation

## 2017-05-13 DIAGNOSIS — I890 Lymphedema, not elsewhere classified: Secondary | ICD-10-CM | POA: Diagnosis not present

## 2017-05-13 DIAGNOSIS — R1312 Dysphagia, oropharyngeal phase: Secondary | ICD-10-CM | POA: Insufficient documentation

## 2017-05-13 DIAGNOSIS — R634 Abnormal weight loss: Secondary | ICD-10-CM

## 2017-05-13 DIAGNOSIS — Z79899 Other long term (current) drug therapy: Secondary | ICD-10-CM | POA: Diagnosis not present

## 2017-05-13 DIAGNOSIS — I10 Essential (primary) hypertension: Secondary | ICD-10-CM | POA: Insufficient documentation

## 2017-05-13 DIAGNOSIS — N281 Cyst of kidney, acquired: Secondary | ICD-10-CM

## 2017-05-13 DIAGNOSIS — C760 Malignant neoplasm of head, face and neck: Secondary | ICD-10-CM

## 2017-05-13 DIAGNOSIS — C01 Malignant neoplasm of base of tongue: Secondary | ICD-10-CM

## 2017-05-13 DIAGNOSIS — Z7982 Long term (current) use of aspirin: Secondary | ICD-10-CM | POA: Insufficient documentation

## 2017-05-13 DIAGNOSIS — C0680 Malignant neoplasm of overlapping sites of unspecified parts of mouth: Secondary | ICD-10-CM

## 2017-05-13 DIAGNOSIS — Z85810 Personal history of malignant neoplasm of tongue: Secondary | ICD-10-CM | POA: Diagnosis not present

## 2017-05-13 LAB — BUN & CREATININE (CHCC)
BUN: 11 mg/dL (ref 7–26)
Creatinine: 0.75 mg/dL (ref 0.60–1.10)
GFR, Est AFR Am: >60 mL/min (ref >60)
GFR, Estimated: >60 mL/min (ref >60)

## 2017-05-13 LAB — TSH: TSH: 1.614 u[IU]/mL (ref 0.308–3.960)

## 2017-05-13 MED ORDER — TRAMADOL HCL 50 MG PO TABS: 50.0000 mg | ORAL_TABLET | Freq: Four times a day (QID) | ORAL | 0 refills | Status: DC | PRN

## 2017-05-13 NOTE — Telephone Encounter (Signed)
I called and spoke to Kristen [name] and informed her that her TSH lab drawn today is normal per Dr. Pearlie Oyster request. She voiced her understanding and knows to call me if she has any further questions or concerns.

## 2017-05-13 NOTE — Progress Notes (Signed)
Radiation Oncology         [phone] ________________________________  Name: Kristen [name] MRN: [id number]  Date: 05/13/2017  DOB: [birth year]  Follow-Up Visit Note  CC: [name], MD  Francina Ames, MD  Diagnosis and Prior Radiotherapy:       ICD-10-CM   1. Malignant neoplasm of base of tongue ([facility]) C01 CT Soft Tissue Neck W Contrast    CT Chest W Contrast    CT Abdomen W Contrast    BUN & Creatinine ([facility])  2. Acquired cyst of kidney N28.1 CT Abdomen W Contrast    CT Abdomen Pelvis W Contrast  3. Carcinoma of contiguous sites of mouth [facility]) C06.80 CT Soft Tissue Neck W Contrast    CT Chest W Contrast    CT Abdomen W Contrast  4. Loss of weight R63.4 TSH  5. 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 Radiation treatment dates: 11/16/2016 - 01/01/2017 Site/dose: 66 Gy directed to the tongue in 33 fractions.  CHIEF COMPLAINT:  Here for follow-up and surveillance of head and neck cancer  Narrative:  The patient returns today for routine follow-up, patient completed radiation 4 months ago to her tongue. She saw Dr. Malen Gauze on 03/29/17 at [facility] at the time she had no evidence of disease on exam. Due to worsen dysphasia he ordered a MBSS . Ultimately the MBSS was performed here in [place] on 04/21/17 showing mild coropharyngeal disphasia. SLP gave her recommendations, last week Gayleen Orem confirmed with the patient that she did not have post tx imaging with [facility]   I will take it upon myself to order these images to be in [place] for patients convenience and will inform [facility].   She expressed that for the past two months she has been jittering and feeling anxious. She reported that she usually doesn't eat until late morning, bu lately has not been feeling as hungry.  Has been having UTIs and difficulty with urinary function.  Pain issues, if any:   Using a  feeding tube?: no Weight changed, if any: yes, she has lost some weight and feels intake is good. Wt Readings from Last 3 Encounters:  05/15/17 184 lb (83.5 kg)  05/13/17 188 lb 3.2 oz (85.4 kg)  05/07/17 194 lb (88 kg)    ALLERGIES:  is allergic to other and sulfur.  Meds: Current Outpatient Medications  Medication Sig Dispense Refill  . amLODipine (NORVASC) 10 MG tablet Take 1 tablet (10 mg total) daily by mouth. 90 tablet 3  . aspirin 81 MG chewable tablet Chew 81 mg by mouth daily.     . calcium carbonate (OSCAL) 1500 (600 Ca) MG TABS tablet Take by mouth 2 (two) times daily with a meal.    . diclofenac (VOLTAREN) 75 MG EC tablet TAKE ONE TABLET BY MOUTH TWICE DAILY WITH FOOD AS NEEDED FOR PAIN (Patient taking differently: Take 75 mg by mouth 2 (two) times daily. ) 180 tablet 3  . Eluxadoline (VIBERZI) 100 MG TABS Take 1 tablet (100 mg total) by mouth 2 (two) times daily. 90 tablet 3  . ergocalciferol (DRISDOL) 8000 UNIT/ML drops Place 6 mLs (48,000 Units total) into feeding tube once a week. 60 mL 1  . gabapentin (NEURONTIN) 300 MG capsule Take 1 capsule (300 mg total) by mouth 3 (three) times daily. 270 capsule 2  . LORazepam (ATIVAN) 0.5 MG tablet  Take 1 tab PRN nausea. Do not take more than 3 pills daily. (Patient taking differently: Take 0.5 mg by mouth 3 (three) times daily as needed (nausea). ) 30 tablet 0  . pantoprazole (PROTONIX) 40 MG tablet Take 1 tablet (40 mg total) daily by mouth. 90 tablet 3  . Potassium 99 MG TABS 1 tablet by Gastrostomy Tube route daily. (Patient taking differently: 1 tablet by Gastrostomy Tube route daily. ) 330 each 0  . SUMAtriptan (IMITREX) 100 MG tablet Take 0.5-1 tablet by mouth at the onset of migraine and may repeat in 2 hours if headache persists or recurs. 10 tablet 0  . baclofen (LIORESAL) 10 MG tablet Take 10 mg by mouth 3 (three) times daily.   4  . cefUROXime (CEFTIN) 500 MG tablet Take 1 tablet (500 mg total) by mouth 2 (two) times daily  for 3 days. Start 4/30 in AM 6 tablet 0  . traMADol (ULTRAM) 50 MG tablet Take 1 tablet (50 mg total) by mouth every 6 (six) hours as needed. (Patient taking differently: Take 50 mg by mouth every 6 (six) hours as needed for moderate pain or severe pain. ) 40 tablet 0   No current facility-administered medications for this encounter.     Physical Findings: The patient is in no acute distress. Patient is alert and oriented. Wt Readings from Last 3 Encounters:  05/15/17 184 lb (83.5 kg)  05/13/17 188 lb 3.2 oz (85.4 kg)  05/07/17 194 lb (88 kg)    height is 5' 9"  (1.753 m) and weight is 188 lb 3.2 oz (85.4 kg). Her temperature is 98.6 F (37 C). Her blood pressure is 114/84 and her pulse is 68. Her oxygen saturation is 99%. .  General: Alert and oriented, in no acute distress HEENT: Head is normocephalic. Extraocular movements are intact. No palpable mass over the posterior tongue. The right posterior tongue is raised compared to the left posterior tongue. The left posterior tongue feels a little tethered r/t surgery. No visible tumor noted in the mouth. Neck: Mo masses appreciated in the neck, there is some swelling which feels to be fluid/edema.   Lab Findings: Lab Results  Component Value Date   WBC 2.9 (L) 05/16/2017   HGB 10.5 (L) 05/16/2017   HCT 33.6 (L) 05/16/2017   MCV 86.8 05/16/2017   PLT 257 05/16/2017    Lab Results  Component Value Date   TSH 1.614 05/13/2017    Radiographic Findings: Dg Abdomen Peg Tube Location  Result Date: 05/07/2017 CLINICAL DATA:  Gastrojejunal tube placement. EXAM: ABDOMEN - 1 VIEW COMPARISON:  01/15/2017. FINDINGS: Contrast in the gastrostomy tube, stomach and proximal small bowel. No extravasated contrast is seen. Normal bowel gas pattern. Thoracolumbar scoliosis with fixation hardware and bone fusion. IMPRESSION: Gastrostomy tube tip in the stomach with normal opacification of the stomach and proximal small bowel without extravasation.  Electronically Signed   By: Claudie Revering M.D.   On: 05/07/2017 11:08   Dg Swallowing Func-speech Pathology  Result Date: 04/21/2017 Objective Swallowing Evaluation: Type of Study: MBS-Modified Barium Swallow Study  Patient Details Name: Kristen [name] MRN: [id number] Date of Birth: November 05, 1965 Today's Date: 04/21/2017 Time: SLP Start Time (ACUTE ONLY): 1300 -SLP Stop Time (ACUTE ONLY): 1335 SLP Time Calculation (min) (ACUTE ONLY): 35 min Past Medical History: Past Medical History: Diagnosis Date . Allergy  . Anemia   Iron deficiency . Anxiety  . Arthritis   back, left shoulder . Chicken pox  . Depression  .  Diarrhea   takes Imodium daily . GERD (gastroesophageal reflux disease)  . H/O hiatal hernia  . Headache(784.0)  . History of kidney stones   1996ish . History of radiation therapy 11/16/16- 01/01/17  Base of Tongue/ 66 gy in 33 fractions/ Dose: 2 Gy . Hypertension  . Migraine   none for 5 years (as of 01/13/16) . OSA (obstructive sleep apnea) 10/13/2015  unable to get cpap, plans to get one in 2018 . Pneumonia  . Restless legs  . Scoliosis  . Shortness of breath   with exertion Past Surgical History: Past Surgical History: Procedure Laterality Date . BACK SURGERY  07/21/1978 . CHOLECYSTECTOMY N/A 08/15/2013  Procedure: LAPAROSCOPIC CHOLECYSTECTOMY WITH INTRAOPERATIVE CHOLANGIOGRAM;  Surgeon: Gwenyth Ober, MD;  Location: [place];  Service: General;  Laterality: N/A; . COLONOSCOPY N/A 01/09/2013  Procedure: COLONOSCOPY;  Surgeon: Beryle Beams, MD;  Location: [place];  Service: Endoscopy;  Laterality: N/A; . DIRECT LARYNGOSCOPY  07/2016  Dr. Nicolette Bang [facility] . GASTROSTOMY TUBE PLACEMENT  09/27/2016 . HERNIA REPAIR Left 1981 . IR PATIENT EVAL TECH 0-60 MINS  12/13/2016 . IR PATIENT EVAL TECH 0-60 MINS  04/11/2017 . MODIFIED RADICAL NECK DISSECTION Left 09/27/2016  Levels 1 & 2 . PARTIAL GLOSSECTOMY Left 09/27/2016  Left hemi partial glossectomy . TONSILLECTOMY   . tracheotomy  09/27/2016 . TUBAL LIGATION  06/1988 HPI:  52 yo female referred for OP MBS = PMH + for tongue base cancer s/p left glossectomy September 2018 and XRT finished December 2018.  Pt is also s/p feeding tube placement.  She reports recently issues with sensing food lodging in the mouth and requiring multiple swallows (dry and liquid) to clear. In addition she reports sensation of food/liquid going up into nasal passage.  She reports she easily gags on particulate matter.  Pt admits to weight loss but she attributes this to decreasing amount of tube feeding.  Pt states her weight has stabilized.   Subjective: pt sitting upright in chair Assessment / Plan / Recommendation CHL IP CLINICAL IMPRESSIONS 04/21/2017 Clinical Impression Patient presents with mild oropharyngeal dysphagia without aspiration or frank penetration of any consistency tested.   Pt compensates for oral deficits by piecemealing with each bolus.  She also places boluses on the right side of her oral cavity due to left glossectomy.  Pt did not transit barium tablet despite trial with pudding and liquids.  Transit trials resulted in her gagging and expectorating tablet.  Pharyngeal swallow is timely without significant residuals. Flash penetration noted with thin - with adequate airway protection.   SLP attempted to challenge pt by asking her to "gulp" her liquids = however pt continued to piecemeal due to her compensations.  There were no episodes of nasal regurgitation during MBS.  Recommend continue diet as tolerated.  Advised she continues to consume a variety of food consistencies to maintain muscle memory for swallowing different textures.   Encouraged pt to continue her HEP and reviewed effective dysphagia compensation strategies.  Also advised pt to use warm/moist heat on musculature prior to exercises *if oncologist approves* to assure muscle pliable as possible.  Thanks for this consult.  SLP Visit Diagnosis Dysphagia, oropharyngeal phase (R13.12) Attention and concentration deficit  following -- Frontal lobe and executive function deficit following -- Impact on safety and function Mild aspiration risk   CHL IP TREATMENT RECOMMENDATION 04/21/2017 Treatment Recommendations Defer treatment plan to f/u with SLP   No flowsheet data found. CHL IP DIET RECOMMENDATION 04/21/2017 SLP Diet Recommendations Dysphagia 3 (  Mech soft) solids;Thin liquid Liquid Administration via Cup;Straw Medication Administration Via alternative means Compensations Slow rate;Small sips/bites;Lingual sweep for clearance of pocketing;Multiple dry swallows after each bite/sip;Follow solids with liquid Postural Changes Remain semi-upright after after feeds/meals (Comment);Seated upright at 90 degrees   CHL IP OTHER RECOMMENDATIONS 04/21/2017 Recommended Consults -- Oral Care Recommendations Oral care QID Other Recommendations --   No flowsheet data found.  No flowsheet data found.     CHL IP ORAL PHASE 04/21/2017 Oral Phase Impaired Oral - Pudding Teaspoon -- Oral - Pudding Cup -- Oral - Honey Teaspoon -- Oral - Honey Cup -- Oral - Nectar Teaspoon -- Oral - Nectar Cup Weak lingual manipulation;Reduced posterior propulsion;Lingual/palatal residue;Piecemeal swallowing Oral - Nectar Straw -- Oral - Thin Teaspoon -- Oral - Thin Cup Weak lingual manipulation;Reduced posterior propulsion;Lingual/palatal residue;Piecemeal swallowing Oral - Thin Straw Weak lingual manipulation;Reduced posterior propulsion;Lingual/palatal residue;Piecemeal swallowing Oral - Puree Weak lingual manipulation;Reduced posterior propulsion;Lingual/palatal residue;Piecemeal swallowing Oral - Mech Soft Weak lingual manipulation;Reduced posterior propulsion;Lingual/palatal residue;Piecemeal swallowing Oral - Regular -- Oral - Multi-Consistency -- Oral - Pill Weak lingual manipulation;Reduced posterior propulsion;Delayed oral transit;Other (Comment) Oral Phase - Comment pt expectorated tablet after gagging elicited with tablet transit attempts, pt compensates well for oral  deficits by piecemealing with each bolus, pt places boluses on the right side of her oral cavity  CHL IP PHARYNGEAL PHASE 04/21/2017 Pharyngeal Phase Impaired Pharyngeal- Pudding Teaspoon -- Pharyngeal -- Pharyngeal- Pudding Cup -- Pharyngeal -- Pharyngeal- Honey Teaspoon -- Pharyngeal -- Pharyngeal- Honey Cup -- Pharyngeal -- Pharyngeal- Nectar Teaspoon -- Pharyngeal -- Pharyngeal- Nectar Cup Reduced tongue base retraction;Pharyngeal residue - valleculae Pharyngeal -- Pharyngeal- Nectar Straw -- Pharyngeal -- Pharyngeal- Thin Teaspoon -- Pharyngeal -- Pharyngeal- Thin Cup Reduced tongue base retraction;Pharyngeal residue - valleculae Pharyngeal -- Pharyngeal- Thin Straw Reduced tongue base retraction;Pharyngeal residue - valleculae Pharyngeal -- Pharyngeal- Puree Reduced tongue base retraction;Pharyngeal residue - valleculae Pharyngeal -- Pharyngeal- Mechanical Soft Reduced tongue base retraction;Pharyngeal residue - valleculae Pharyngeal -- Pharyngeal- Regular -- Pharyngeal -- Pharyngeal- Multi-consistency -- Pharyngeal -- Pharyngeal- Pill NT Pharyngeal -- Pharyngeal Comment --  CHL IP CERVICAL ESOPHAGEAL PHASE 04/21/2017 Cervical Esophageal Phase Impaired Pudding Teaspoon -- Pudding Cup -- Honey Teaspoon -- Honey Cup -- Nectar Teaspoon -- Nectar Cup -- Nectar Straw -- Thin Teaspoon -- Thin Cup -- Thin Straw -- Puree -- Mechanical Soft -- Regular -- Multi-consistency -- Pill -- Cervical Esophageal Comment upon esophageal sweep at end of study, pt appeared clear No flowsheet data found. Macario Golds 04/21/2017, 2:19 PM  Luanna Salk, [facility] SLP [phone]              Impression/Plan:    1) Head and Neck Cancer Status: NED but losing wt - will get CT neck/chest/abd/pelvis to work up weight loss but also f/u on kidney abnormalities see on staging scans.  2) Nutritional Status: Will refer to nutritionist for weight loss  3) Risk Factors: The patient has been educated about risk factors including alcohol and  tobacco abuse; they understand that avoidance of alcohol and tobacco is important to prevent recurrences as well as other cancers  4) Swallowing: She will see her swallowing specialist on May 6th, 2019.  5) Dental: Encouraged to continue regular followup with dentistry, and dental hygiene including fluoride rinses. Will make a referral to Dr. Frederik Schmidt for denture fabrication and discussion.    6) Thyroid function:  WNL Lab Results  Component Value Date   TSH 1.614 05/13/2017     7) Other: I  will refer patient for lymphedema tx. Patient would like to be at multidisciplinary clinic, where she can talk to social worker too  Note w/ referral sent to Gayleen Orem, RN, our Head and Neck Oncology Navigator Pt arrange f/u [place] due to symptom issues  Still seeing Glendell Docker - he can see her at [facility] as well if convenient Needs Barb - wt loss Social Work - emotional issues PT - neck edema  8) Follow up in four months  - I will call her with CT results as these will be done much sooner. She will continue to see Dr. Silvio Clayman for her physical exams.   The patient was encouraged to call with any issues or questions before then.  I spent 30 minutes minutes face to face with the patient and more than 50% of that time was spent in counseling and/or coordination of care.  _____   Eppie Gibson, MD  This document serves as a record of services personally performed by Eppie Gibson MD. It was created on her behalf by Delton Coombes, a trained medical scribe. The creation of this record is based on the scribe's personal observations and the provider's statements to them.

## 2017-05-13 NOTE — Telephone Encounter (Signed)
Ok to call in 80m one tab q6 hours prn pain, 40, no refills

## 2017-05-14 ENCOUNTER — Other Ambulatory Visit: Payer: Self-pay

## 2017-05-14 ENCOUNTER — Encounter (HOSPITAL_COMMUNITY): Payer: Self-pay

## 2017-05-14 DIAGNOSIS — M48061 Spinal stenosis, lumbar region without neurogenic claudication: Secondary | ICD-10-CM | POA: Diagnosis not present

## 2017-05-14 DIAGNOSIS — Z791 Long term (current) use of non-steroidal anti-inflammatories (NSAID): Secondary | ICD-10-CM | POA: Insufficient documentation

## 2017-05-14 DIAGNOSIS — R740 Nonspecific elevation of levels of transaminase and lactic acid dehydrogenase [LDH]: Secondary | ICD-10-CM | POA: Diagnosis not present

## 2017-05-14 DIAGNOSIS — D72819 Decreased white blood cell count, unspecified: Secondary | ICD-10-CM | POA: Insufficient documentation

## 2017-05-14 DIAGNOSIS — Z7982 Long term (current) use of aspirin: Secondary | ICD-10-CM | POA: Diagnosis not present

## 2017-05-14 DIAGNOSIS — Z9889 Other specified postprocedural states: Secondary | ICD-10-CM | POA: Diagnosis not present

## 2017-05-14 DIAGNOSIS — N3000 Acute cystitis without hematuria: Secondary | ICD-10-CM | POA: Diagnosis present

## 2017-05-14 DIAGNOSIS — Z9049 Acquired absence of other specified parts of digestive tract: Secondary | ICD-10-CM | POA: Diagnosis not present

## 2017-05-14 DIAGNOSIS — G4733 Obstructive sleep apnea (adult) (pediatric): Secondary | ICD-10-CM | POA: Diagnosis not present

## 2017-05-14 DIAGNOSIS — E860 Dehydration: Secondary | ICD-10-CM | POA: Diagnosis present

## 2017-05-14 DIAGNOSIS — Z85810 Personal history of malignant neoplasm of tongue: Secondary | ICD-10-CM | POA: Diagnosis not present

## 2017-05-14 DIAGNOSIS — K219 Gastro-esophageal reflux disease without esophagitis: Secondary | ICD-10-CM | POA: Diagnosis not present

## 2017-05-14 DIAGNOSIS — Z87442 Personal history of urinary calculi: Secondary | ICD-10-CM | POA: Diagnosis not present

## 2017-05-14 DIAGNOSIS — K449 Diaphragmatic hernia without obstruction or gangrene: Secondary | ICD-10-CM | POA: Diagnosis not present

## 2017-05-14 DIAGNOSIS — M419 Scoliosis, unspecified: Secondary | ICD-10-CM | POA: Insufficient documentation

## 2017-05-14 DIAGNOSIS — I10 Essential (primary) hypertension: Secondary | ICD-10-CM | POA: Diagnosis not present

## 2017-05-14 DIAGNOSIS — N390 Urinary tract infection, site not specified: Secondary | ICD-10-CM | POA: Diagnosis not present

## 2017-05-14 DIAGNOSIS — G43909 Migraine, unspecified, not intractable, without status migrainosus: Secondary | ICD-10-CM | POA: Insufficient documentation

## 2017-05-14 DIAGNOSIS — D509 Iron deficiency anemia, unspecified: Secondary | ICD-10-CM | POA: Diagnosis not present

## 2017-05-14 DIAGNOSIS — Z931 Gastrostomy status: Secondary | ICD-10-CM | POA: Insufficient documentation

## 2017-05-14 DIAGNOSIS — R1312 Dysphagia, oropharyngeal phase: Secondary | ICD-10-CM | POA: Diagnosis not present

## 2017-05-14 DIAGNOSIS — Z79899 Other long term (current) drug therapy: Secondary | ICD-10-CM | POA: Diagnosis not present

## 2017-05-14 DIAGNOSIS — Z923 Personal history of irradiation: Secondary | ICD-10-CM | POA: Insufficient documentation

## 2017-05-14 DIAGNOSIS — F419 Anxiety disorder, unspecified: Secondary | ICD-10-CM | POA: Diagnosis not present

## 2017-05-14 NOTE — ED Triage Notes (Signed)
Pt reports fatigue increasing over the last 2 days. She is also reporting bilateral leg pain that is achy in nature and worse when she ambulates. She also endorses nausea and bilateral hand "jerking" (not visualized in triage.) States that she also has a "little bit of a headache and some radiating pain to her back."

## 2017-05-15 ENCOUNTER — Observation Stay (HOSPITAL_COMMUNITY)
Admission: EM | Admit: 2017-05-15 | Discharge: 2017-05-16 | Disposition: A | Discharge status: Home / Self Care | Payer: BC Managed Care – PPO | Attending: Family Medicine | Admitting: Family Medicine

## 2017-05-15 ENCOUNTER — Encounter (HOSPITAL_COMMUNITY): Payer: Self-pay | Admitting: Emergency Medicine

## 2017-05-15 DIAGNOSIS — R740 Nonspecific elevation of levels of transaminase and lactic acid dehydrogenase [LDH]: Secondary | ICD-10-CM | POA: Diagnosis not present

## 2017-05-15 DIAGNOSIS — G9340 Encephalopathy, unspecified: Secondary | ICD-10-CM | POA: Diagnosis not present

## 2017-05-15 DIAGNOSIS — N3000 Acute cystitis without hematuria: Secondary | ICD-10-CM

## 2017-05-15 DIAGNOSIS — E860 Dehydration: Secondary | ICD-10-CM

## 2017-05-15 DIAGNOSIS — N390 Urinary tract infection, site not specified: Secondary | ICD-10-CM | POA: Diagnosis present

## 2017-05-15 DIAGNOSIS — M48061 Spinal stenosis, lumbar region without neurogenic claudication: Secondary | ICD-10-CM | POA: Diagnosis present

## 2017-05-15 DIAGNOSIS — K219 Gastro-esophageal reflux disease without esophagitis: Secondary | ICD-10-CM | POA: Diagnosis present

## 2017-05-15 DIAGNOSIS — I10 Essential (primary) hypertension: Secondary | ICD-10-CM | POA: Diagnosis present

## 2017-05-15 LAB — CBC
HCT: 37.2 % (ref 36.0–46.0)
Hemoglobin: 11.6 g/dL — ABNORMAL LOW (ref 12.0–15.0)
MCH: 27.2 pg (ref 26.0–34.0)
MCHC: 31.2 g/dL (ref 30.0–36.0)
MCV: 87.1 fL (ref 78.0–100.0)
PLATELETS: 313 10*3/uL (ref 150–400)
RBC: 4.27 MIL/uL (ref 3.87–5.11)
RDW: 15.8 % — AB (ref 11.5–15.5)
WBC: 3.9 10*3/uL — ABNORMAL LOW (ref 4.0–10.5)

## 2017-05-15 LAB — URINALYSIS, ROUTINE W REFLEX MICROSCOPIC
Bilirubin Urine: NEGATIVE
GLUCOSE, UA: NEGATIVE mg/dL
HGB URINE DIPSTICK: NEGATIVE
Ketones, ur: NEGATIVE mg/dL
Nitrite: POSITIVE — AB
Protein, ur: NEGATIVE mg/dL
SPECIFIC GRAVITY, URINE: 1.011 (ref 1.005–1.030)
WBC, UA: >50 WBC/hpf — ABNORMAL HIGH (ref 0–5)
pH: 7 (ref 5.0–8.0)

## 2017-05-15 LAB — COMPREHENSIVE METABOLIC PANEL
ALK PHOS: 105 U/L (ref 38–126)
ALT: 52 U/L (ref 14–54)
AST: 62 U/L — AB (ref 15–41)
Albumin: 3.6 g/dL (ref 3.5–5.0)
Anion gap: 11 (ref 5–15)
BUN: 11 mg/dL (ref 6–20)
CALCIUM: 8.9 mg/dL (ref 8.9–10.3)
CO2: 24 mmol/L (ref 22–32)
CREATININE: 0.61 mg/dL (ref 0.44–1.00)
Chloride: 110 mmol/L (ref 101–111)
GFR calc non Af Amer: >60 mL/min (ref >60)
GLUCOSE: 105 mg/dL — AB (ref 65–99)
Potassium: 3.9 mmol/L (ref 3.5–5.1)
SODIUM: 145 mmol/L (ref 135–145)
Total Bilirubin: 0.8 mg/dL (ref 0.3–1.2)
Total Protein: 6.4 g/dL — ABNORMAL LOW (ref 6.5–8.1)

## 2017-05-15 LAB — PHOSPHORUS: PHOSPHORUS: 3.6 mg/dL (ref 2.5–4.6)

## 2017-05-15 LAB — I-STAT BETA HCG BLOOD, ED (MC, WL, AP ONLY): I-stat hCG, quantitative: <5 m[IU]/mL (ref <5)

## 2017-05-15 LAB — MAGNESIUM: MAGNESIUM: 1.9 mg/dL (ref 1.7–2.4)

## 2017-05-15 LAB — LIPASE, BLOOD: Lipase: 20 U/L (ref 11–51)

## 2017-05-15 MED ORDER — TRAMADOL HCL 50 MG PO TABS: 50.0000 mg | ORAL_TABLET | Freq: Four times a day (QID) | ORAL | Status: DC | PRN

## 2017-05-15 MED ORDER — GABAPENTIN 300 MG PO CAPS
300.0000 mg | ORAL_CAPSULE | Freq: Three times a day (TID) | ORAL | Status: DC
  Administered 2017-05-15 – 2017-05-16 (×2): 300 mg
  Filled 2017-05-15 (×2): qty 1

## 2017-05-15 MED ORDER — ELUXADOLINE 100 MG PO TABS: 100.0000 mg | ORAL_TABLET | Freq: Two times a day (BID) | ORAL | Status: DC

## 2017-05-15 MED ORDER — BACLOFEN 10 MG PO TABS
10.0000 mg | ORAL_TABLET | Freq: Three times a day (TID) | ORAL | Status: DC
  Administered 2017-05-15 – 2017-05-16 (×2): 10 mg via JEJUNOSTOMY
  Filled 2017-05-15 (×2): qty 1

## 2017-05-15 MED ORDER — ASPIRIN 81 MG PO CHEW
81.0000 mg | CHEWABLE_TABLET | Freq: Every day | ORAL | Status: DC
  Administered 2017-05-16: 81 mg via JEJUNOSTOMY
  Filled 2017-05-15: qty 1

## 2017-05-15 MED ORDER — ASPIRIN 81 MG PO CHEW
81.0000 mg | CHEWABLE_TABLET | Freq: Every day | ORAL | Status: DC
  Administered 2017-05-15: 81 mg via ORAL
  Filled 2017-05-15: qty 1

## 2017-05-15 MED ORDER — HEPARIN SODIUM (PORCINE) 5000 UNIT/ML IJ SOLN
5000.0000 [IU] | Freq: Three times a day (TID) | INTRAMUSCULAR | Status: DC
Start: 2017-05-15 — End: 2017-05-16
  Administered 2017-05-15 – 2017-05-16 (×3): 5000 [IU] via SUBCUTANEOUS
  Filled 2017-05-15 (×3): qty 1

## 2017-05-15 MED ORDER — ERGOCALCIFEROL 8000 UNIT/ML PO SOLN
48000.0000 [IU] | ORAL | Status: DC
  Administered 2017-05-16: 48000 [IU]
  Filled 2017-05-15 (×2): qty 6

## 2017-05-15 MED ORDER — LORAZEPAM 0.5 MG PO TABS: 0.5000 mg | ORAL_TABLET | Freq: Three times a day (TID) | ORAL | Status: DC | PRN

## 2017-05-15 MED ORDER — PANTOPRAZOLE SODIUM 40 MG PO TBEC
40.0000 mg | DELAYED_RELEASE_TABLET | Freq: Every day | ORAL | Status: DC
Start: 2017-05-15 — End: 2017-05-16
  Administered 2017-05-15: 40 mg via ORAL
  Filled 2017-05-15: qty 1

## 2017-05-15 MED ORDER — SODIUM CHLORIDE 0.9 % IV BOLUS
1000.0000 mL | Freq: Once | INTRAVENOUS | Status: AC
  Administered 2017-05-15: 1000 mL via INTRAVENOUS

## 2017-05-15 MED ORDER — SODIUM CHLORIDE 0.9 % IV SOLN
1.0000 g | INTRAVENOUS | Status: DC
  Administered 2017-05-16: 1 g via INTRAVENOUS
  Filled 2017-05-15: qty 1

## 2017-05-15 MED ORDER — ENSURE ENLIVE PO LIQD
237.0000 mL | Freq: Two times a day (BID) | ORAL | Status: DC
  Administered 2017-05-15 – 2017-05-16 (×2): 237 mL via ORAL

## 2017-05-15 MED ORDER — SODIUM CHLORIDE 0.9% FLUSH
3.0000 mL | Freq: Two times a day (BID) | INTRAVENOUS | Status: DC
  Administered 2017-05-15: 3 mL via INTRAVENOUS

## 2017-05-15 MED ORDER — BACLOFEN 10 MG PO TABS
10.0000 mg | ORAL_TABLET | Freq: Three times a day (TID) | ORAL | Status: DC
  Administered 2017-05-15: 10 mg via ORAL
  Filled 2017-05-15: qty 1

## 2017-05-15 MED ORDER — SODIUM CHLORIDE 0.9 % IV SOLN: 250.0000 mL | INTRAVENOUS | Status: DC | PRN

## 2017-05-15 MED ORDER — SODIUM CHLORIDE 0.9% FLUSH
3.0000 mL | INTRAVENOUS | Status: DC | PRN
  Administered 2017-05-16: 3 mL via INTRAVENOUS
  Filled 2017-05-15: qty 3

## 2017-05-15 MED ORDER — LOPERAMIDE HCL 1 MG/5ML PO LIQD
2.0000 mg | ORAL | Status: DC | PRN
  Administered 2017-05-16: 2 mg
  Filled 2017-05-15: qty 10

## 2017-05-15 MED ORDER — SODIUM CHLORIDE 0.9 % IV SOLN
1.0000 g | Freq: Once | INTRAVENOUS | Status: AC
  Administered 2017-05-15: 1 g via INTRAVENOUS
  Filled 2017-05-15: qty 10

## 2017-05-15 MED ORDER — TRAMADOL HCL 50 MG PO TABS
50.0000 mg | ORAL_TABLET | Freq: Four times a day (QID) | ORAL | Status: DC | PRN
  Administered 2017-05-16: 50 mg via JEJUNOSTOMY
  Filled 2017-05-15: qty 1

## 2017-05-15 MED ORDER — KETOROLAC TROMETHAMINE 30 MG/ML IJ SOLN
15.0000 mg | Freq: Once | INTRAMUSCULAR | Status: AC
  Administered 2017-05-15: 15 mg via INTRAVENOUS
  Filled 2017-05-15: qty 1

## 2017-05-15 MED ORDER — GABAPENTIN 300 MG PO CAPS
300.0000 mg | ORAL_CAPSULE | Freq: Three times a day (TID) | ORAL | Status: DC
  Administered 2017-05-15: 300 mg via ORAL
  Filled 2017-05-15: qty 1

## 2017-05-15 NOTE — H&P (Signed)
History and Physical    [name] WSF:[id number] DOB: September 12, 1965 DOA: 05/15/2017  PCP: [name], MD  Patient coming from: home  Chief Complaint: weakness, chills  HPI: Kristen [name] is a 52 y.o. female with medical history significant of squamous cell carcinoma of tongue s/p resection and radiation therapy reportedly in remission. She is presenting today with complaints of weakness and chills for the last 2 days. The problem was gradual and has been persistent. Has been getting worse. Nothing she is aware of makes it better or worse. Denies any hematuria.  ED Course: Pt reportedly with altered mental status and found to have UTI via U/A. We were consulted for further evaluation and recommendations.   Review of Systems: As per HPI otherwise 10 point review of systems negative.    Past Medical History:  Diagnosis Date  . Allergy   . Anemia    Iron deficiency  . Anxiety   . Arthritis    back, left shoulder  . Cancer ([place])   . Chicken pox   . Depression   . Diarrhea    takes Imodium daily  . GERD (gastroesophageal reflux disease)   . H/O hiatal hernia   . Headache(784.0)   . History of kidney stones    1996ish  . History of radiation therapy 11/16/16- 01/01/17   Base of Tongue/ 66 gy in 33 fractions/ Dose: 2 Gy  . Hypertension   . Migraine    none for 5 years (as of 01/13/16)  . OSA (obstructive sleep apnea) 10/13/2015   unable to get cpap, plans to get one in 2018  . Pneumonia   . Restless legs   . Scoliosis   . Shortness of breath    with exertion    Past Surgical History:  Procedure Laterality Date  . BACK SURGERY  07/21/1978  . CHOLECYSTECTOMY N/A 08/15/2013   Procedure: LAPAROSCOPIC CHOLECYSTECTOMY WITH INTRAOPERATIVE CHOLANGIOGRAM;  Surgeon: Gwenyth Ober, MD;  Location: [facility];  Service: General;  Laterality: N/A;  . COLONOSCOPY N/A 01/09/2013   Procedure: COLONOSCOPY;  Surgeon: Beryle Beams, MD;  Location: [facility];  Service: Endoscopy;   Laterality: N/A;  . DIRECT LARYNGOSCOPY  07/2016   Dr. Nicolette Bang [facility]  . GASTROSTOMY TUBE PLACEMENT  09/27/2016  . HERNIA REPAIR Left 1981  . IR PATIENT EVAL TECH 0-60 MINS  12/13/2016  . IR PATIENT EVAL TECH 0-60 MINS  04/11/2017  . MODIFIED RADICAL NECK DISSECTION Left 09/27/2016   Levels 1 & 2  . PARTIAL GLOSSECTOMY Left 09/27/2016   Left hemi partial glossectomy  . TONSILLECTOMY    . tracheotomy  09/27/2016  . TUBAL LIGATION  06/1988     reports that she has never smoked. She has never used smokeless tobacco. She reports that she drinks about 4.2 oz of alcohol per week. She reports that she does not use drugs.  Allergies  Allergen Reactions  . Other Hives and Swelling    tomato  . Sulfur Hives and Swelling    Family History  Problem Relation Age of Onset  . Heart disease Father   . Heart attack Father   . Hypertension Mother   . Arthritis Mother   . Diabetes Maternal Grandmother   . Diabetes Paternal Grandmother   . Diabetes Maternal Uncle     Prior to Admission medications   Medication Sig Start Date End Date Taking? Authorizing Provider  amLODipine (NORVASC) 10 MG tablet Take 1 tablet (10 mg total) daily by mouth. 11/30/16  Yes [name], MD  aspirin 81 MG chewable tablet Chew 81 mg by mouth daily.  10/05/16  Yes [provider]  baclofen (LIORESAL) 10 MG tablet Take 10 mg by mouth 3 (three) times daily.  05/10/17  Yes [provider]  calcium carbonate (OSCAL) 1500 (600 Ca) MG TABS tablet Take by mouth 2 (two) times daily with a meal.   Yes [provider]  diclofenac (VOLTAREN) 75 MG EC tablet TAKE ONE TABLET BY MOUTH TWICE DAILY WITH FOOD AS NEEDED FOR PAIN Patient taking differently: Take 75 mg by mouth 2 (two) times daily.  11/22/16  Yes Jessy Oto, MD  Eluxadoline (VIBERZI) 100 MG TABS Take 1 tablet (100 mg total) by mouth 2 (two) times daily. 03/23/17  Yes [name], MD  ergocalciferol (DRISDOL) 8000 UNIT/ML drops Place 6  mLs (48,000 Units total) into feeding tube once a week. 03/04/17  Yes [name], MD  gabapentin (NEURONTIN) 300 MG capsule Take 1 capsule (300 mg total) by mouth 3 (three) times daily. 04/06/17  Yes [name], MD  LORazepam (ATIVAN) 0.5 MG tablet Take 1 tab PRN nausea. Do not take more than 3 pills daily. Patient taking differently: Take 0.5 mg by mouth 3 (three) times daily as needed (nausea).  12/31/16  Yes Eppie Gibson, MD  pantoprazole (PROTONIX) 40 MG tablet Take 1 tablet (40 mg total) daily by mouth. 11/30/16  Yes [name], MD  Potassium 99 MG TABS 1 tablet by Gastrostomy Tube route daily. Patient taking differently: 1 tablet by Gastrostomy Tube route daily.  11/30/16  Yes [name], MD  SUMAtriptan (IMITREX) 100 MG tablet Take 0.5-1 tablet by mouth at the onset of migraine and may repeat in 2 hours if headache persists or recurs. 05/14/16  Yes Golden Circle, FNP  traMADol (ULTRAM) 50 MG tablet Take 1 tablet (50 mg total) by mouth every 6 (six) hours as needed. Patient taking differently: Take 50 mg by mouth every 6 (six) hours as needed for moderate pain or severe pain.  05/13/17  Yes Aundra Dubin, PA-C  Doxylamine-DM (VICKS NYQUIL COUGH) 6.25-15 MG/15ML LIQD Take 31m before bedtime for cold/cough. Patient not taking: Reported on 05/15/2017 01/14/17   SEppie Gibson MD  HYDROcodone-acetaminophen (NORCO/VICODIN) 5-325 MG tablet Take 1-2 tablets by mouth every 4 (four) hours as needed for moderate pain. Taper as pain improves. Patient not taking: Reported on 05/15/2017 01/14/17   SEppie Gibson MD  indomethacin (INDOCIN) 25 MG capsule Take 1 capsule by mouth 30-60 minutes prior to sexual activity. Patient not taking: Reported on 05/15/2017 05/14/16   CGolden Circle FNP  lidocaine (XYLOCAINE) 2 % solution Mix 1 part 2%viscous lidocaine,1part H2O.Swish and/or swallow 113mof this mixture,3036mbefore meals and at bedtime, up to QID Patient not taking: Reported  on 05/15/2017 11/22/16   SquEppie GibsonD  Phenylephrine-Guaifenesin 7.5-100 MG/5ML LIQD Take 5m75m6 hrs PRN congestion and cough during daytime only. Patient not taking: Reported on 05/15/2017 01/14/17   SquiEppie Gibson  tiZANidine (ZANAFLEX) 4 MG tablet TAKE 1 TABLET BY MOUTH EVERY 6 HOURS AS NEEDED Patient not taking: Reported on 05/15/2017 05/12/16   NitkJessy Oto    Physical Exam: Vitals:   05/15/17 0645 05/15/17 0700 05/15/17 0800 05/15/17 0900  BP:  132/85 113/78 118/87  Pulse: 64 61 (!) 59 65  Resp: 14 15 13 19   Temp:      TempSrc:  SpO2: 100% 100% 99% 100%    Constitutional: NAD, calm, comfortable Vitals:   05/15/17 0645 05/15/17 0700 05/15/17 0800 05/15/17 0900  BP:  132/85 113/78 118/87  Pulse: 64 61 (!) 59 65  Resp: 14 15 13 19   Temp:      TempSrc:      SpO2: 100% 100% 99% 100%   Eyes: PERRL, lids and conjunctivae normal ENMT: Mucous membranes are moist. Posterior pharynx clear of any exudate or lesions. Neck: normal, supple, no masses, no thyromegaly Respiratory: clear to auscultation bilaterally, no wheezing, no crackles. Normal respiratory effort. No accessory muscle use.  Cardiovascular: Regular rate and rhythm, no murmurs / rubs / gallops. No extremity edema. 2+ pedal pulses. No carotid bruits.  Abdomen: no tenderness, no masses palpated. No hepatosplenomegaly. Bowel sounds positive.  Musculoskeletal: no clubbing / cyanosis. No joint deformity upper and lower extremities.  Skin: no rashes, lesions, ulcers. No induration Neurologic: No facial asymmetry, answers questions appropriately. Sensation intact, Strength 5/5 in all 4.  Psychiatric: Normal judgment and insight.  Normal mood.   Labs on Admission: I have personally reviewed following labs and imaging studies  CBC: Recent Labs  Lab 05/15/17 0005  WBC 3.9*  HGB 11.6*  HCT 37.2  MCV 87.1  PLT 962   Basic Metabolic Panel: Recent Labs  Lab 05/13/17 1223 05/15/17 0005  NA  --  145  K   --  3.9  CL  --  110  CO2  --  24  GLUCOSE  --  105*  BUN 11 11  CREATININE 0.75 0.61  CALCIUM  --  8.9   GFR: Estimated Creatinine Clearance: 97.1 mL/min (by C-G formula based on SCr of 0.61 mg/dL). Liver Function Tests: Recent Labs  Lab 05/15/17 0005  AST 62*  ALT 52  ALKPHOS 105  BILITOT 0.8  PROT 6.4*  ALBUMIN 3.6   Recent Labs  Lab 05/15/17 0005  LIPASE 20   No results for input(s): AMMONIA in the last 168 hours. Coagulation Profile: No results for input(s): INR, PROTIME in the last 168 hours. Cardiac Enzymes: No results for input(s): CKTOTAL, CKMB, CKMBINDEX, TROPONINI in the last 168 hours. BNP (last 3 results) No results for input(s): PROBNP in the last 8760 hours. HbA1C: No results for input(s): HGBA1C in the last 72 hours. CBG: No results for input(s): GLUCAP in the last 168 hours. Lipid Profile: No results for input(s): CHOL, HDL, LDLCALC, TRIG, CHOLHDL, LDLDIRECT in the last 72 hours. Thyroid Function Tests: Recent Labs    05/13/17 1223  TSH 1.614   Anemia Panel: No results for input(s): VITAMINB12, FOLATE, FERRITIN, TIBC, IRON, RETICCTPCT in the last 72 hours. Urine analysis:    Component Value Date/Time   COLORURINE YELLOW 05/15/2017 0154   APPEARANCEUR CLOUDY (A) 05/15/2017 0154   LABSPEC 1.011 05/15/2017 0154   PHURINE 7.0 05/15/2017 0154   GLUCOSEU NEGATIVE 05/15/2017 0154   HGBUR NEGATIVE 05/15/2017 0154   BILIRUBINUR NEGATIVE 05/15/2017 0154   KETONESUR NEGATIVE 05/15/2017 0154   PROTEINUR NEGATIVE 05/15/2017 0154   UROBILINOGEN 1.0 01/08/2013 0912   NITRITE POSITIVE (A) 05/15/2017 0154   LEUKOCYTESUR LARGE (A) 05/15/2017 0154    Radiological Exams on Admission: No results found.  EKG: Independently reviewed. Sinus rhythm with no st elevations or depressions. Prolonged qt interval  Assessment/Plan Active Problems:   Acute encephalopathy - secondary to UTI, although on my evaluation patient was alert and awake and answering  questions appropriately  UTI - Will treat with antibiotic therapy - urine  culture - Rocephin  Malignant neoplasm of base of tongue - s/p resection - Pt reports being in remission  GERD - stable  Essential HTN - will continue   Prolonged QT interval - avoid medication which may prolong QT interval further - check magnesium and phosphorus levels  DVT prophylaxis: heparin Code Status: full Family Communication: none at bedside Disposition Plan: med surg Consults called: none Admission status: obs   Velvet Bathe MD [facility] Pager [phone]  If 7PM-7AM, please contact night-coverage [email or web address] Password [facility]  05/15/2017, 11:14 AM

## 2017-05-15 NOTE — ED Notes (Signed)
ED TO INPATIENT HANDOFF REPORT  Name/Age/Gender Kristen [name] 52 y.o. female  Code Status Code Status History    Date Active Date Inactive Code Status Order ID Comments User Context   02/10/2017 1612 02/13/2017 1819 Full Code [id number]  Oswald Hillock, MD ED   01/20/2016 2212 01/26/2016 1705 Full Code [id number]  Jessy Oto, MD Inpatient   01/08/2013 1344 01/10/2013 1555 Full Code [id number]  Karen Kitchens Inpatient      Home/SNF/Other Home  Chief Complaint nausea;leg pain  Level of Care/Admitting Diagnosis ED Disposition    ED Disposition Condition [place] [facility] Area: [facility] [[id number]]  Level of Care: Med-Surg [16]  Diagnosis: UTI (urinary tract infection) [250539]  Admitting Physician: Velvet Bathe [[id number]]  Attending Physician: Velvet Bathe [[id number]]  PT Class (Do Not Modify): Observation [104]  PT Acc Code (Do Not Modify): Observation [10022]       Medical History Past Medical History:  Diagnosis Date  . Allergy   . Anemia    Iron deficiency  . Anxiety   . Arthritis    back, left shoulder  . Cancer ([place])   . Chicken pox   . Depression   . Diarrhea    takes Imodium daily  . GERD (gastroesophageal reflux disease)   . H/O hiatal hernia   . Headache(784.0)   . History of kidney stones    1996ish  . History of radiation therapy 11/16/16- 01/01/17   Base of Tongue/ 66 gy in 33 fractions/ Dose: 2 Gy  . Hypertension   . Migraine    none for 5 years (as of 01/13/16)  . OSA (obstructive sleep apnea) 10/13/2015   unable to get cpap, plans to get one in 2018  . Pneumonia   . Restless legs   . Scoliosis   . Shortness of breath    with exertion    Allergies Allergies  Allergen Reactions  . Other Hives and Swelling    tomato  . Sulfur Hives and Swelling    IV Location/Drains/Wounds Patient Lines/Drains/Airways Status   Active Line/Drains/Airways    Name:   Placement date:   Placement time:   Site:   Days:   Peripheral IV 05/15/17 Left;Anterior Forearm   05/15/17    0505    Forearm   less than 1   Gastrostomy/Enterostomy PEG-jejunostomy LLQ   -    -    LLQ             Labs/Imaging Results for orders placed or performed during the hospital encounter of 05/15/17 (from the past 48 hour(s))  Lipase, blood     Status: None   Collection Time: 05/15/17 12:05 AM  Result Value Ref Range   Lipase 20 11 - 51 U/L    Comment: Performed at [facility], [place] [address] [place], [place] [address]  Comprehensive metabolic panel     Status: Abnormal   Collection Time: 05/15/17 12:05 AM  Result Value Ref Range   Sodium 145 135 - 145 mmol/L   Potassium 3.9 3.5 - 5.1 mmol/L   Chloride 110 101 - 111 mmol/L   CO2 24 22 - 32 mmol/L   Glucose, Bld 105 (H) 65 - 99 mg/dL   BUN 11 6 - 20 mg/dL   Creatinine, Ser 0.61 0.44 - 1.00 mg/dL   Calcium 8.9 8.9 - 10.3 mg/dL   Total Protein 6.4 (L) 6.5 - 8.1 g/dL   Albumin 3.6 3.5 -  5.0 g/dL   AST 62 (H) 15 - 41 U/L   ALT 52 14 - 54 U/L   Alkaline Phosphatase 105 38 - 126 U/L   Total Bilirubin 0.8 0.3 - 1.2 mg/dL   GFR calc non Af Amer >60 >60 mL/min   GFR calc Af Amer >60 >60 mL/min    Comment: (NOTE) The eGFR has been calculated using the CKD EPI equation. This calculation has not been validated in all clinical situations. eGFR's persistently <60 mL/min signify possible Chronic Kidney Disease.    Anion gap 11 5 - 15    Comment: Performed at [facility], [place] [address], [place], [place] [address]  CBC     Status: Abnormal   Collection Time: 05/15/17 12:05 AM  Result Value Ref Range   WBC 3.9 (L) 4.0 - 10.5 K/uL   RBC 4.27 3.87 - 5.11 MIL/uL   Hemoglobin 11.6 (L) 12.0 - 15.0 g/dL   HCT 37.2 36.0 - 46.0 %   MCV 87.1 78.0 - 100.0 fL   MCH 27.2 26.0 - 34.0 pg   MCHC 31.2 30.0 - 36.0 g/dL   RDW 15.8 (H) 11.5 - 15.5 %   Platelets 313 150 - 400 K/uL    Comment: Performed at [facility], [place] [address] [place], [place] [address]  I-Stat beta hCG blood, ED     Status: None   Collection Time: 05/15/17 12:21 AM  Result Value Ref Range   I-stat hCG, quantitative <5.0 <5 mIU/mL   Comment 3            Comment:   GEST. AGE      CONC.  (mIU/mL)   <=1 WEEK        5 - 50     2 WEEKS       50 - 500     3 WEEKS       100 - 10,000     4 WEEKS     1,000 - 30,000        FEMALE AND NON-PREGNANT FEMALE:     LESS THAN 5 mIU/mL   Urinalysis, Routine w reflex microscopic     Status: Abnormal   Collection Time: 05/15/17  1:54 AM  Result Value Ref Range   Color, Urine YELLOW YELLOW   APPearance CLOUDY (A) CLEAR   Specific Gravity, Urine 1.011 1.005 - 1.030   pH 7.0 5.0 - 8.0   Glucose, UA NEGATIVE NEGATIVE mg/dL   Hgb urine dipstick NEGATIVE NEGATIVE   Bilirubin Urine NEGATIVE NEGATIVE   Ketones, ur NEGATIVE NEGATIVE mg/dL   Protein, ur NEGATIVE NEGATIVE mg/dL   Nitrite POSITIVE (A) NEGATIVE   Leukocytes, UA LARGE (A) NEGATIVE   RBC / HPF 0-5 0 - 5 RBC/hpf   WBC, UA >50 (H) 0 - 5 WBC/hpf   Bacteria, UA MANY (A) NONE SEEN   Squamous Epithelial / LPF 21-50 0 - 5    Comment: Please note change in reference range. Performed at [facility], [place] [address], [place], [place] [address]    No results found.  Pending Labs [employer] (From admission, onward)   Start     Ordered   Signed and Held  Culture, Urine  Once,   R    Comments:  If not already obtained    Signed and Held   Signed and Held  CBC  (heparin)  Once,   R    Comments:  Baseline for heparin therapy  IF NOT ALREADY DRAWN.  Notify MD if PLT < 100 K.    Signed and Held   Signed and Held  Creatinine, serum  (heparin)  Once,   R    Comments:  Baseline for heparin therapy IF NOT ALREADY DRAWN.    Signed and Held   Signed and Held  Phosphorus  Once,   R     Signed and Held   Signed and Held  Magnesium  Once,   R     Signed and Held   Signed and Held  Basic metabolic panel  Tomorrow morning,   R     Signed  and Held   Signed and Held  CBC  Tomorrow morning,   R     Signed and Held      Vitals/Pain Today's Vitals   05/15/17 0800 05/15/17 0900 05/15/17 1100 05/15/17 1200  BP: 113/78 118/87 122/81 (!) 116/91  Pulse: (!) 59 65 63 65  Resp: _0 Temp:      TempSrc:      SpO2: 99% 100% 100% 98%  PainSc:        Isolation Precautions No active isolations  Medications Medications  cefTRIAXone (ROCEPHIN) 1 g in sodium chloride 0.9 % 100 mL IVPB (has no administration in time range)  sodium chloride 0.9 % bolus 1,000 mL (0 mLs Intravenous Stopped 05/15/17 0631)  cefTRIAXone (ROCEPHIN) 1 g in sodium chloride 0.9 % 100 mL IVPB (0 g Intravenous Stopped 05/15/17 0631)  ketorolac (TORADOL) 30 MG/ML injection 15 mg (15 mg Intravenous Given 05/15/17 0507)    Mobility walks

## 2017-05-15 NOTE — ED Notes (Signed)
Urine culture sent down with UA. 

## 2017-05-15 NOTE — Progress Notes (Signed)
Patient was asking to get her medication through her J-tube, that is how she takes them at home. Md on call was paged. RN will wait on new orders.

## 2017-05-15 NOTE — ED Notes (Signed)
Attempted to call Report. RN is providing patient care and will call ED when finished to receive report.

## 2017-05-15 NOTE — Progress Notes (Signed)
PHARMACY NOTE -  Ceftriaxone  Pharmacy asked to assist with dosing of Ceftriaxone for UTI.  Ceftriaxone 1g IV q24h. Medication does not require renal/hepatic adjustment.     Pharmacy will sign off at this time.  Please reconsult if a change in clinical status warrants re-evaluation of dosage.   Lindell Spar, PharmD, BCPS Pager: [phone] 05/15/2017 11:18 AM

## 2017-05-15 NOTE — ED Notes (Signed)
Pt made aware a urine sample is needed in triage.

## 2017-05-15 NOTE — ED Provider Notes (Signed)
[place] DEPT Provider Note   CSN: [id number] Arrival date & time: 05/14/17  2319     History   Chief Complaint Chief Complaint  Patient presents with  . Fatigue  . Nausea    HPI Kristen [name] is a 52 y.o. female.  The history is provided by the patient.  Altered Mental Status   This is a recurrent problem. The current episode started yesterday. Associated symptoms include somnolence. Pertinent negatives include no weakness and no agitation. Associated symptoms comments: Global weakness, total body spasms, nausea and one episode of emesis yesterday.  . Risk factors include a recent illness. Her past medical history does not include dementia.  Illness  This is a recurrent problem. The current episode started yesterday. The problem occurs constantly. Pertinent negatives include no chest pain, no abdominal pain, no headaches and no shortness of breath. Nothing aggravates the symptoms. Nothing relieves the symptoms. She has tried nothing for the symptoms. The treatment provided no relief.  Total body spasms and sleepiness.    Past Medical History:  Diagnosis Date  . Allergy   . Anemia    Iron deficiency  . Anxiety   . Arthritis    back, left shoulder  . Cancer ([place])   . Chicken pox   . Depression   . Diarrhea    takes Imodium daily  . GERD (gastroesophageal reflux disease)   . H/O hiatal hernia   . Headache(784.0)   . History of kidney stones    1996ish  . History of radiation therapy 11/16/16- 01/01/17   Base of Tongue/ 66 gy in 33 fractions/ Dose: 2 Gy  . Hypertension   . Migraine    none for 5 years (as of 01/13/16)  . OSA (obstructive sleep apnea) 10/13/2015   unable to get cpap, plans to get one in 2018  . Pneumonia   . Restless legs   . Scoliosis   . Shortness of breath    with exertion    Patient Active Problem List   Diagnosis Date Noted  . Hypotension 02/10/2017  . 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 Malignant neoplasm of base of tongue ([place]) 10/29/2016  . 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[place]) 10/29/2016  . Headache associated with sexual activity 05/14/2016  . Tongue lesion 05/14/2016  . Loose stools 03/12/2016  . Hypocalcemia 01/22/2016  . Thrombocytopenia ([place]) 01/22/2016  . Chronic diarrhea 01/22/2016  . Chest pain   . Essential hypertension   . Orthostatic hypotension   . Spinal stenosis of lumbar region 01/20/2016    Class: Chronic  . DDD (degenerative disc disease), lumbar 01/20/2016    Class: Chronic  . Spinal stenosis of lumbar region with neurogenic claudication 01/20/2016  . Low back pain 12/22/2015  . Pre-op examination 12/22/2015  . OSA (obstructive sleep apnea) 10/13/2015  . Muscle cramp 08/01/2015  . GERD (gastroesophageal reflux disease) 07/09/2015  . Excessive daytime sleepiness 06/18/2015  . Shingles 08/28/2013  . Postop check 08/28/2013  . Cholelithiasis 07/10/2013  . Colitis 01/08/2013  . Postoperative anemia 01/08/2013  . Morbid obesity with BMI of 40.0-44.9, adult ([place]) 01/08/2013    Past Surgical History:  Procedure Laterality Date  . BACK SURGERY  07/21/1978  . CHOLECYSTECTOMY N/A 08/15/2013   Procedure: LAPAROSCOPIC CHOLECYSTECTOMY WITH INTRAOPERATIVE CHOLANGIOGRAM;  Surgeon: Gwenyth Ober, MD;  Location: [place];  Service: General;  Laterality: N/A;  . COLONOSCOPY N/A 01/09/2013   Procedure: COLONOSCOPY;  Surgeon: Tory Emerald  Benson Norway, MD;  Location: [place];  Service: Endoscopy;  Laterality: N/A;  . DIRECT LARYNGOSCOPY  07/2016   Dr. Nicolette Bang [facility]  . GASTROSTOMY TUBE PLACEMENT  09/27/2016  . HERNIA REPAIR Left 1981  . IR PATIENT EVAL TECH 0-60 MINS  12/13/2016  . IR PATIENT EVAL TECH 0-60 MINS  04/11/2017  . MODIFIED RADICAL NECK DISSECTION Left 09/27/2016   Levels 1 & 2  . PARTIAL GLOSSECTOMY Left 09/27/2016   Left hemi partial glossectomy  . TONSILLECTOMY    . tracheotomy  09/27/2016  . TUBAL LIGATION  06/1988     OB  History   None      Home Medications    Prior to Admission medications   Medication Sig Start Date End Date Taking? Authorizing Provider  amLODipine (NORVASC) 10 MG tablet Take 1 tablet (10 mg total) daily by mouth. 11/30/16   [name], MD  aspirin 81 MG chewable tablet 81 mg. 10/05/16   [provider]  baclofen (LIORESAL) 20 MG tablet Take 20 mg by mouth.    [provider]  calcium carbonate (OSCAL) 1500 (600 Ca) MG TABS tablet Take by mouth 2 (two) times daily with a meal.    [provider]  diclofenac (VOLTAREN) 75 MG EC tablet TAKE ONE TABLET BY MOUTH TWICE DAILY WITH FOOD AS NEEDED FOR PAIN Patient taking differently: 75 mg. TAKE ONE TABLET BY MOUTH TWICE DAILY WITH FOOD AS NEEDED FOR PAIN 11/22/16   Jessy Oto, MD  diclofenac (VOLTAREN) 75 MG EC tablet TAKE 1 TABLET BY MOUTH TWICE DAILY WITH FOOD AS NEEDED FOR PAIN 05/05/17   Leandrew Koyanagi, MD  Doxylamine-DM (VICKS NYQUIL COUGH) 6.25-15 MG/15ML LIQD Take 27m before bedtime for cold/cough. 01/14/17   SEppie Gibson MD  Eluxadoline (VIBERZI) 100 MG TABS Take 1 tablet (100 mg total) by mouth 2 (two) times daily. 03/23/17   BBillie Ruddy MD  ergocalciferol (DRISDOL) 8000 UNIT/ML drops Place 6 mLs (48,000 Units total) into feeding tube once a week. 03/04/17   BBillie Ruddy MD  gabapentin (NEURONTIN) 300 MG capsule Take 1 capsule (300 mg total) by mouth 3 (three) times daily. 04/06/17   BBillie Ruddy MD  HYDROcodone-acetaminophen (NORCO/VICODIN) 5-325 MG tablet Take 1-2 tablets by mouth every 4 (four) hours as needed for moderate pain. Taper as pain improves. 01/14/17   SEppie Gibson MD  indomethacin (INDOCIN) 25 MG capsule Take 1 capsule by mouth 30-60 minutes prior to sexual activity. 05/14/16   CGolden Circle FNP  lidocaine (XYLOCAINE) 2 % solution Mix 1 part 2%viscous lidocaine,1part H2O.Swish and/or swallow 127mof this mixture,3048mbefore meals and at bedtime, up to QID 11/22/16    SquEppie GibsonD  LORazepam (ATIVAN) 0.5 MG tablet Take 1 tab PRN nausea. Do not take more than 3 pills daily. 12/31/16   SquEppie GibsonD  pantoprazole (PROTONIX) 40 MG tablet Take 1 tablet (40 mg total) daily by mouth. 11/30/16   BanBillie RuddyD  Phenylephrine-Guaifenesin 7.5-100 MG/5ML LIQD Take 76m26m6 hrs PRN congestion and cough during daytime only. 01/14/17   SquiEppie Gibson  Potassium 99 MG TABS 1 tablet by Gastrostomy Tube route daily. 11/30/16   BankBillie Ruddy  SUMAtriptan (IMITREX) 100 MG tablet Take 0.5-1 tablet by mouth at the onset of migraine and may repeat in 2 hours if headache persists or recurs. 05/14/16   CaloGolden CircleP  tiZANidine (ZANAFLEX) 4 MG tablet TAKE 1 TABLET BY MOUTH  EVERY 6 HOURS AS NEEDED 05/12/16   Jessy Oto, MD  traMADol (ULTRAM) 50 MG tablet Take 1 tablet (50 mg total) by mouth every 6 (six) hours as needed. 05/13/17   Aundra Dubin, PA-C    Family History Family History  Problem Relation Age of Onset  . Heart disease Father   . Heart attack Father   . Hypertension Mother   . Arthritis Mother   . Diabetes Maternal Grandmother   . Diabetes Paternal Grandmother   . Diabetes Maternal Uncle     Social History Social History   Tobacco Use  . Smoking status: Never Smoker  . Smokeless tobacco: Never Used  Substance Use Topics  . Alcohol use: Yes    Alcohol/week: 4.2 oz    Types: 7 Cans of beer per week    Comment: 1 beer daily  . Drug use: No     Allergies   Other and Sulfur   Review of Systems Review of Systems  Constitutional: Positive for fatigue. Negative for diaphoresis and fever.  HENT: Negative for congestion.   Eyes: Negative for photophobia.  Respiratory: Negative for cough, chest tightness and shortness of breath.   Cardiovascular: Negative for chest pain, palpitations and leg swelling.  Gastrointestinal: Positive for nausea and vomiting. Negative for abdominal pain.  Genitourinary: Negative for flank  pain.  Musculoskeletal: Negative for neck pain.  Neurological: Negative for speech difficulty, weakness, numbness and headaches.  Psychiatric/Behavioral: Negative for agitation.  All other systems reviewed and are negative.    Physical Exam Updated Vital Signs BP 105/74   Pulse 69   Temp 98 F (36.7 C) (Oral)   Resp 16   LMP 11/15/2013   SpO2 100%   Physical Exam  Constitutional: She appears well-developed and well-nourished.  HENT:  Head: Normocephalic and atraumatic.  Dry mucus membranes  Eyes: Pupils are equal, round, and reactive to light. Conjunctivae are normal.  Neck: Normal range of motion. Neck supple. No tracheal deviation present.  Cardiovascular: Normal rate, regular rhythm, normal heart sounds and intact distal pulses.  Pulmonary/Chest: Effort normal and breath sounds normal. No stridor. No respiratory distress. She has no wheezes. She has no rales.  Abdominal: Soft. Bowel sounds are normal. She exhibits no mass. There is no tenderness. There is no rebound and no guarding.  Musculoskeletal: Normal range of motion. She exhibits no tenderness or deformity.  Neurological: She is alert. She displays normal reflexes.  Skin: Skin is warm and dry. Capillary refill takes less than 2 seconds.  Psychiatric: She has a normal mood and affect.     ED Treatments / Results  Labs (all labs ordered are listed, but only abnormal results are displayed) Results for orders placed or performed during the hospital encounter of 05/15/17  Lipase, blood  Result Value Ref Range   Lipase 20 11 - 51 U/L  Comprehensive metabolic panel  Result Value Ref Range   Sodium 145 135 - 145 mmol/L   Potassium 3.9 3.5 - 5.1 mmol/L   Chloride 110 101 - 111 mmol/L   CO2 24 22 - 32 mmol/L   Glucose, Bld 105 (H) 65 - 99 mg/dL   BUN 11 6 - 20 mg/dL   Creatinine, Ser 0.61 0.44 - 1.00 mg/dL   Calcium 8.9 8.9 - 10.3 mg/dL   Total Protein 6.4 (L) 6.5 - 8.1 g/dL   Albumin 3.6 3.5 - 5.0 g/dL   AST 62  (H) 15 - 41 U/L   ALT 52 14 -  54 U/L   Alkaline Phosphatase 105 38 - 126 U/L   Total Bilirubin 0.8 0.3 - 1.2 mg/dL   GFR calc non Af Amer >60 >60 mL/min   GFR calc Af Amer >60 >60 mL/min   Anion gap 11 5 - 15  CBC  Result Value Ref Range   WBC 3.9 (L) 4.0 - 10.5 K/uL   RBC 4.27 3.87 - 5.11 MIL/uL   Hemoglobin 11.6 (L) 12.0 - 15.0 g/dL   HCT 37.2 36.0 - 46.0 %   MCV 87.1 78.0 - 100.0 fL   MCH 27.2 26.0 - 34.0 pg   MCHC 31.2 30.0 - 36.0 g/dL   RDW 15.8 (H) 11.5 - 15.5 %   Platelets 313 150 - 400 K/uL  Urinalysis, Routine w reflex microscopic  Result Value Ref Range   Color, Urine YELLOW YELLOW   APPearance CLOUDY (A) CLEAR   Specific Gravity, Urine 1.011 1.005 - 1.030   pH 7.0 5.0 - 8.0   Glucose, UA NEGATIVE NEGATIVE mg/dL   Hgb urine dipstick NEGATIVE NEGATIVE   Bilirubin Urine NEGATIVE NEGATIVE   Ketones, ur NEGATIVE NEGATIVE mg/dL   Protein, ur NEGATIVE NEGATIVE mg/dL   Nitrite POSITIVE (A) NEGATIVE   Leukocytes, UA LARGE (A) NEGATIVE   RBC / HPF 0-5 0 - 5 RBC/hpf   WBC, UA >50 (H) 0 - 5 WBC/hpf   Bacteria, UA MANY (A) NONE SEEN   Squamous Epithelial / LPF 21-50 0 - 5  I-Stat beta hCG blood, ED  Result Value Ref Range   I-stat hCG, quantitative <5.0 <5 mIU/mL   Comment 3           Dg Abdomen Peg Tube Location  Result Date: 05/07/2017 CLINICAL DATA:  Gastrojejunal tube placement. EXAM: ABDOMEN - 1 VIEW COMPARISON:  01/15/2017. FINDINGS: Contrast in the gastrostomy tube, stomach and proximal small bowel. No extravasated contrast is seen. Normal bowel gas pattern. Thoracolumbar scoliosis with fixation hardware and bone fusion. IMPRESSION: Gastrostomy tube tip in the stomach with normal opacification of the stomach and proximal small bowel without extravasation. Electronically Signed   By: Claudie Revering M.D.   On: 05/07/2017 11:08   Dg Swallowing Func-speech Pathology  Result Date: 04/21/2017 Objective Swallowing Evaluation: Type of Study: MBS-Modified Barium Swallow Study   Patient Details Name: DERICKA OSTENSON MRN: [id number] Date of Birth: Jun 27, 1965 Today's Date: 04/21/2017 Time: SLP Start Time (ACUTE ONLY): 1300 -SLP Stop Time (ACUTE ONLY): 1335 SLP Time Calculation (min) (ACUTE ONLY): 35 min Past Medical History: Past Medical History: Diagnosis Date . Allergy  . Anemia   Iron deficiency . Anxiety  . Arthritis   back, left shoulder . Chicken pox  . Depression  . Diarrhea   takes Imodium daily . GERD (gastroesophageal reflux disease)  . H/O hiatal hernia  . Headache(784.0)  . History of kidney stones   1996ish . History of radiation therapy 11/16/16- 01/01/17  Base of Tongue/ 66 gy in 33 fractions/ Dose: 2 Gy . Hypertension  . Migraine   none for 5 years (as of 01/13/16) . OSA (obstructive sleep apnea) 10/13/2015  unable to get cpap, plans to get one in 2018 . Pneumonia  . Restless legs  . Scoliosis  . Shortness of breath   with exertion Past Surgical History: Past Surgical History: Procedure Laterality Date . BACK SURGERY  07/21/1978 . CHOLECYSTECTOMY N/A 08/15/2013  Procedure: LAPAROSCOPIC CHOLECYSTECTOMY WITH INTRAOPERATIVE CHOLANGIOGRAM;  Surgeon: Gwenyth Ober, MD;  Location: [place];  Service: General;  Laterality: N/A; .  COLONOSCOPY N/A 01/09/2013  Procedure: COLONOSCOPY;  Surgeon: Beryle Beams, MD;  Location: [place];  Service: Endoscopy;  Laterality: N/A; . DIRECT LARYNGOSCOPY  07/2016  Dr. Nicolette Bang [facility] . GASTROSTOMY TUBE PLACEMENT  09/27/2016 . HERNIA REPAIR Left 1981 . IR PATIENT EVAL TECH 0-60 MINS  12/13/2016 . IR PATIENT EVAL TECH 0-60 MINS  04/11/2017 . MODIFIED RADICAL NECK DISSECTION Left 09/27/2016  Levels 1 & 2 . PARTIAL GLOSSECTOMY Left 09/27/2016  Left hemi partial glossectomy . TONSILLECTOMY   . tracheotomy  09/27/2016 . TUBAL LIGATION  06/1988 HPI: 52 yo female referred for OP MBS = PMH + for tongue base cancer s/p left glossectomy September 2018 and XRT finished December 2018.  Pt is also s/p feeding tube placement.  She reports recently issues with sensing  food lodging in the mouth and requiring multiple swallows (dry and liquid) to clear. In addition she reports sensation of food/liquid going up into nasal passage.  She reports she easily gags on particulate matter.  Pt admits to weight loss but she attributes this to decreasing amount of tube feeding.  Pt states her weight has stabilized.   Subjective: pt sitting upright in chair Assessment / Plan / Recommendation CHL IP CLINICAL IMPRESSIONS 04/21/2017 Clinical Impression Patient presents with mild oropharyngeal dysphagia without aspiration or frank penetration of any consistency tested.   Pt compensates for oral deficits by piecemealing with each bolus.  She also places boluses on the right side of her oral cavity due to left glossectomy.  Pt did not transit barium tablet despite trial with pudding and liquids.  Transit trials resulted in her gagging and expectorating tablet.  Pharyngeal swallow is timely without significant residuals. Flash penetration noted with thin - with adequate airway protection.   SLP attempted to challenge pt by asking her to "gulp" her liquids = however pt continued to piecemeal due to her compensations.  There were no episodes of nasal regurgitation during MBS.  Recommend continue diet as tolerated.  Advised she continues to consume a variety of food consistencies to maintain muscle memory for swallowing different textures.   Encouraged pt to continue her HEP and reviewed effective dysphagia compensation strategies.  Also advised pt to use warm/moist heat on musculature prior to exercises *if oncologist approves* to assure muscle pliable as possible.  Thanks for this consult.  SLP Visit Diagnosis Dysphagia, oropharyngeal phase (R13.12) Attention and concentration deficit following -- Frontal lobe and executive function deficit following -- Impact on safety and function Mild aspiration risk   CHL IP TREATMENT RECOMMENDATION 04/21/2017 Treatment Recommendations Defer treatment plan to f/u with  SLP   No flowsheet data found. CHL IP DIET RECOMMENDATION 04/21/2017 SLP Diet Recommendations Dysphagia 3 (Mech soft) solids;Thin liquid Liquid Administration via Cup;Straw Medication Administration Via alternative means Compensations Slow rate;Small sips/bites;Lingual sweep for clearance of pocketing;Multiple dry swallows after each bite/sip;Follow solids with liquid Postural Changes Remain semi-upright after after feeds/meals (Comment);Seated upright at 90 degrees   CHL IP OTHER RECOMMENDATIONS 04/21/2017 Recommended Consults -- Oral Care Recommendations Oral care QID Other Recommendations --   No flowsheet data found.  No flowsheet data found.     CHL IP ORAL PHASE 04/21/2017 Oral Phase Impaired Oral - Pudding Teaspoon -- Oral - Pudding Cup -- Oral - Honey Teaspoon -- Oral - Honey Cup -- Oral - Nectar Teaspoon -- Oral - Nectar Cup Weak lingual manipulation;Reduced posterior propulsion;Lingual/palatal residue;Piecemeal swallowing Oral - Nectar Straw -- Oral - Thin Teaspoon -- Oral - Thin Cup Weak lingual manipulation;Reduced posterior  propulsion;Lingual/palatal residue;Piecemeal swallowing Oral - Thin Straw Weak lingual manipulation;Reduced posterior propulsion;Lingual/palatal residue;Piecemeal swallowing Oral - Puree Weak lingual manipulation;Reduced posterior propulsion;Lingual/palatal residue;Piecemeal swallowing Oral - Mech Soft Weak lingual manipulation;Reduced posterior propulsion;Lingual/palatal residue;Piecemeal swallowing Oral - Regular -- Oral - Multi-Consistency -- Oral - Pill Weak lingual manipulation;Reduced posterior propulsion;Delayed oral transit;Other (Comment) Oral Phase - Comment pt expectorated tablet after gagging elicited with tablet transit attempts, pt compensates well for oral deficits by piecemealing with each bolus, pt places boluses on the right side of her oral cavity  CHL IP PHARYNGEAL PHASE 04/21/2017 Pharyngeal Phase Impaired Pharyngeal- Pudding Teaspoon -- Pharyngeal -- Pharyngeal- Pudding  Cup -- Pharyngeal -- Pharyngeal- Honey Teaspoon -- Pharyngeal -- Pharyngeal- Honey Cup -- Pharyngeal -- Pharyngeal- Nectar Teaspoon -- Pharyngeal -- Pharyngeal- Nectar Cup Reduced tongue base retraction;Pharyngeal residue - valleculae Pharyngeal -- Pharyngeal- Nectar Straw -- Pharyngeal -- Pharyngeal- Thin Teaspoon -- Pharyngeal -- Pharyngeal- Thin Cup Reduced tongue base retraction;Pharyngeal residue - valleculae Pharyngeal -- Pharyngeal- Thin Straw Reduced tongue base retraction;Pharyngeal residue - valleculae Pharyngeal -- Pharyngeal- Puree Reduced tongue base retraction;Pharyngeal residue - valleculae Pharyngeal -- Pharyngeal- Mechanical Soft Reduced tongue base retraction;Pharyngeal residue - valleculae Pharyngeal -- Pharyngeal- Regular -- Pharyngeal -- Pharyngeal- Multi-consistency -- Pharyngeal -- Pharyngeal- Pill NT Pharyngeal -- Pharyngeal Comment --  CHL IP CERVICAL ESOPHAGEAL PHASE 04/21/2017 Cervical Esophageal Phase Impaired Pudding Teaspoon -- Pudding Cup -- Honey Teaspoon -- Honey Cup -- Nectar Teaspoon -- Nectar Cup -- Nectar Straw -- Thin Teaspoon -- Thin Cup -- Thin Straw -- Puree -- Mechanical Soft -- Regular -- Multi-consistency -- Pill -- Cervical Esophageal Comment upon esophageal sweep at end of study, pt appeared clear No flowsheet data found. Macario Golds 04/21/2017, 2:19 PM  Luanna Salk, [place] [facility] SLP [phone]              EKG EKG Interpretation  Date/Time:  Sunday Aarish Rockers 28 2019 00:18:08 EDT Ventricular Rate:  70 PR Interval:    QRS Duration: 100 QT Interval:  469 QTC Calculation: 507 R Axis:   -3 Text Interpretation:  Sinus rhythm Probable left ventricular hypertrophy prolonged QT interval Confirmed by Dory Horn) on 05/15/2017 4:32:10 AM   Radiology No results found.  Procedures Procedures (including critical care time)  Medications Ordered in ED Medications  sodium chloride 0.9 % bolus 1,000 mL (has no administration in time range)  cefTRIAXone  (ROCEPHIN) 1 g in sodium chloride 0.9 % 100 mL IVPB (has no administration in time range)  ketorolac (TORADOL) 30 MG/ML injection 15 mg (has no administration in time range)       Final Clinical Impressions(s) / ED Diagnoses   Final diagnoses:  Dehydration  Acute cystitis without hematuria   Will admit to medicine for IV hydration IV antibiotics and medication   Graysin Luczynski, MD 05/15/17 [id number]

## 2017-05-15 NOTE — ED Notes (Signed)
Pt declines any issues with her g tube

## 2017-05-15 NOTE — ED Notes (Signed)
Hospitalist MD Vega at patient bedside.

## 2017-05-16 DIAGNOSIS — R740 Nonspecific elevation of levels of transaminase and lactic acid dehydrogenase [LDH]: Secondary | ICD-10-CM

## 2017-05-16 DIAGNOSIS — R9431 Abnormal electrocardiogram [ECG] [EKG]: Secondary | ICD-10-CM | POA: Diagnosis not present

## 2017-05-16 DIAGNOSIS — D649 Anemia, unspecified: Secondary | ICD-10-CM

## 2017-05-16 DIAGNOSIS — N390 Urinary tract infection, site not specified: Secondary | ICD-10-CM | POA: Diagnosis not present

## 2017-05-16 DIAGNOSIS — N3000 Acute cystitis without hematuria: Secondary | ICD-10-CM

## 2017-05-16 DIAGNOSIS — F419 Anxiety disorder, unspecified: Secondary | ICD-10-CM | POA: Diagnosis not present

## 2017-05-16 DIAGNOSIS — D509 Iron deficiency anemia, unspecified: Secondary | ICD-10-CM | POA: Diagnosis not present

## 2017-05-16 LAB — BASIC METABOLIC PANEL
Anion gap: 7 (ref 5–15)
BUN: 10 mg/dL (ref 6–20)
CHLORIDE: 110 mmol/L (ref 101–111)
CO2: 25 mmol/L (ref 22–32)
Calcium: 8.8 mg/dL — ABNORMAL LOW (ref 8.9–10.3)
Creatinine, Ser: 0.64 mg/dL (ref 0.44–1.00)
GFR calc Af Amer: >60 mL/min (ref >60)
GFR calc non Af Amer: >60 mL/min (ref >60)
GLUCOSE: 83 mg/dL (ref 65–99)
POTASSIUM: 3.7 mmol/L (ref 3.5–5.1)
Sodium: 142 mmol/L (ref 135–145)

## 2017-05-16 LAB — CBC
HEMATOCRIT: 33.6 % — AB (ref 36.0–46.0)
Hemoglobin: 10.5 g/dL — ABNORMAL LOW (ref 12.0–15.0)
MCH: 27.1 pg (ref 26.0–34.0)
MCHC: 31.3 g/dL (ref 30.0–36.0)
MCV: 86.8 fL (ref 78.0–100.0)
Platelets: 257 10*3/uL (ref 150–400)
RBC: 3.87 MIL/uL (ref 3.87–5.11)
RDW: 15.7 % — ABNORMAL HIGH (ref 11.5–15.5)
WBC: 2.9 10*3/uL — ABNORMAL LOW (ref 4.0–10.5)

## 2017-05-16 MED ORDER — CEFUROXIME AXETIL 500 MG PO TABS: 500.0000 mg | ORAL_TABLET | Freq: Two times a day (BID) | ORAL | 0 refills | Status: AC

## 2017-05-16 NOTE — Discharge Summary (Signed)
Physician Discharge Summary  Kristen [name] BSJ:[id number] DOB: 1965/09/23 DOA: 05/15/2017  PCP: [name], MD  Admit date: 05/15/2017 Discharge date: 05/16/2017  Recommendations for Outpatient Follow-up:  1. Follow-up for resolution of UTI 2. Follow-up elevated AST if clinically indicated   Follow-up Information    [name], MD. Schedule an appointment as soon as possible for a visit in 2 week(s).   Specialty:  Family Medicine Contact information: [place] [address] [phone]            Discharge Diagnoses:  1. UTI  2. Prolonged QTc 3. Elevated AST 4. Normocytic anemia 5. Squamous cell carcinoma tongue  Discharge Condition: improved Disposition: home  Diet recommendation: regular  Filed Weights   05/15/17 1358  Weight: 83.5 kg (184 lb)    History of present illness:  66yow PMH squamous cell carcinoma tongue s/p resection and XRT presented with weakness, chills x48 hours. Admitted for UTI.  Hospital Course:  Patient rapidly improved with treatment for UTI.  Hospitalization was uncomplicated.  Individual issues as below. UTI without systemic signs. --Afebrile, minimal leukopenia noted.  Hemodynamics stable. --Clinically improving.  Change to oral antibiotics.  Follow-up as an outpatient  Acute encephalopathy? No objective findings documented in exam of EDP or admitting physician. --Patient denies any confusion.  There is no documentation to support this during her hospitalization therefore this condition has been ruled out.  Prolonged QTc --Resolved on repeat EKG today.  EKG sinus rhythm, no acute changes noted  Elevated AST --Etiology unclear.  Consider repeat check as an outpatient.  Normocytic anemia --stable, likely anemia of chronic disease, f/u as an outpatient  Squamous cell carcinoma tongue  Antimicrobials:  Ceftriaxone 4/28 >> 4/29  Ceftin 4/30 >> 5/2  Today's assessment: See progress  note same day  Discharge Instructions   Allergies as of 05/16/2017      Reactions   Other Hives, Swelling   tomato   Sulfur Hives, Swelling      Medication List    STOP taking these medications   Doxylamine-DM 6.25-15 MG/15ML Liqd Commonly known as:  VICKS NYQUIL COUGH   HYDROcodone-acetaminophen 5-325 MG tablet Commonly known as:  NORCO/VICODIN   indomethacin 25 MG capsule Commonly known as:  INDOCIN   lidocaine 2 % solution Commonly known as:  XYLOCAINE   Phenylephrine-guaiFENesin 7.5-100 MG/5ML Liqd   tiZANidine 4 MG tablet Commonly known as:  ZANAFLEX     TAKE these medications   amLODipine 10 MG tablet Commonly known as:  NORVASC Take 1 tablet (10 mg total) daily by mouth.   aspirin 81 MG chewable tablet Chew 81 mg by mouth daily.   baclofen 10 MG tablet Commonly known as:  LIORESAL Take 10 mg by mouth 3 (three) times daily.   calcium carbonate 1500 (600 Ca) MG Tabs tablet Commonly known as:  OSCAL Take by mouth 2 (two) times daily with a meal.   cefUROXime 500 MG tablet Commonly known as:  CEFTIN Take 1 tablet (500 mg total) by mouth 2 (two) times daily for 3 days. Start 4/30 in AM   diclofenac 75 MG EC tablet Commonly known as:  VOLTAREN TAKE ONE TABLET BY MOUTH TWICE DAILY WITH FOOD AS NEEDED FOR PAIN What changed:    how much to take  how to take this  when to take this  additional instructions   Eluxadoline 100 MG Tabs Commonly known as:  VIBERZI Take 1 tablet (100 mg total) by mouth 2 (two) times  daily.   ergocalciferol 8000 UNIT/ML drops Commonly known as:  DRISDOL Place 6 mLs (48,000 Units total) into feeding tube once a week.   gabapentin 300 MG capsule Commonly known as:  NEURONTIN Take 1 capsule (300 mg total) by mouth 3 (three) times daily.   LORazepam 0.5 MG tablet Commonly known as:  ATIVAN Take 1 tab PRN nausea. Do not take more than 3 pills daily. What changed:    how much to take  how to take this  when to  take this  reasons to take this  additional instructions   pantoprazole 40 MG tablet Commonly known as:  PROTONIX Take 1 tablet (40 mg total) daily by mouth.   Potassium 99 MG Tabs 1 tablet by Gastrostomy Tube route daily. What changed:    how much to take  how to take this  when to take this  additional instructions   SUMAtriptan 100 MG tablet Commonly known as:  IMITREX Take 0.5-1 tablet by mouth at the onset of migraine and may repeat in 2 hours if headache persists or recurs.   traMADol 50 MG tablet Commonly known as:  ULTRAM Take 1 tablet (50 mg total) by mouth every 6 (six) hours as needed. What changed:  reasons to take this      Allergies  Allergen Reactions  . Other Hives and Swelling    tomato  . Sulfur Hives and Swelling    The results of significant diagnostics from this hospitalization (including imaging, microbiology, ancillary and laboratory) are listed below for reference.    Significant Diagnostic Studies:  Labs: Basic Metabolic Panel: Recent Labs  Lab 05/13/17 1223 05/15/17 0005 05/15/17 1415 05/16/17 0426  NA  --  145  --  142  K  --  3.9  --  3.7  CL  --  110  --  110  CO2  --  24  --  25  GLUCOSE  --  105*  --  83  BUN 11 11  --  10  CREATININE 0.75 0.61  --  0.64  CALCIUM  --  8.9  --  8.8*  MG  --   --  1.9  --   PHOS  --   --  3.6  --    Liver Function Tests: Recent Labs  Lab 05/15/17 0005  AST 62*  ALT 52  ALKPHOS 105  BILITOT 0.8  PROT 6.4*  ALBUMIN 3.6   Recent Labs  Lab 05/15/17 0005  LIPASE 20   CBC: Recent Labs  Lab 05/15/17 0005 05/16/17 0426  WBC 3.9* 2.9*  HGB 11.6* 10.5*  HCT 37.2 33.6*  MCV 87.1 86.8  PLT 313 257    Active Problems:   GERD (gastroesophageal reflux disease)   Spinal stenosis of lumbar region   Essential hypertension   Acute encephalopathy   UTI (urinary tract infection)   Time coordinating discharge: 35 minutes  Signed:  Murray Hodgkins, MD [facility] 05/16/2017, 12:47 PM

## 2017-05-16 NOTE — Progress Notes (Signed)
Discharge instructions reviewed with patient and patient verbalizes understanding.  Patient is stable, pain well-managed, patient in no apparent distress.  Patient transported to daughter's personal vehicle by NT via wheelchair

## 2017-05-16 NOTE — Progress Notes (Signed)
  PROGRESS NOTE  Kristen [name] QAS:[id number] DOB: [birth year] DOA: 05/15/2017 PCP: [name], MD  Brief Narrative: 67yow PMH squamous cell carcinoma tongue s/p resection and XRT presented with weakness, chills x48 hours. Admitted for UTI.  Assessment/Plan UTI without systemic signs. --Afebrile, minimal leukopenia noted.  Hemodynamic stable. --Clinically improving.  Change to oral antibiotics.  Follow-up as an outpatient  Acute encephalopathy? No objective findings documented in exam of EDP or admitting physician. --Patient denies any confusion.  There is no documentation of the starting her hospitalization therefore this condition has been ruled out at this point.  Prolonged QTc --Resolved on repeat EKG today.  EKG sinus rhythm, no acute changes noted  Elevated AST --Etiology unclear.  Consider repeat check as an outpatient.  Normocytic anemia --stable, likely anemia of chronic disease, f/u as an outpatient  Squamous cell carcinoma tongue  Doing well.  Plan discharge home today.   Murray Hodgkins, MD  [facility] Direct contact: [phone] --Via amion app OR  --[email or web address]; password [email or web address]  7PM-7AM contact night coverage as above 05/16/2017, 11:07 AM  LOS: 0 days   Consultants:    Procedures:    Antimicrobials:  Ceftriaxone 4/28 >> 4/29  Ceftin 4/30 >> 5/2  Interval history/Subjective: Feels much better.  Reported some leg pain which she had with previous UTI, this is improving.  Reported urgency and bladder pressure.  Generalized weakness improved.  Objective: Vitals:  Vitals:   05/15/17 2056 05/16/17 0440  BP: 116/80 108/80  Pulse: 65 61  Resp: 18 16  Temp: 98.9 F (37.2 C) 98.4 F (36.9 C)  SpO2: 100% 100%    Exam:  Constitutional:  . Appears calm and comfortable Respiratory:  . CTA bilaterally, no w/r/r.  . Respiratory effort normal Cardiovascular:  . RRR, no m/r/g . No LE extremity edema   .  RLE, LLE   o strength and  tone normal, no atrophy, no abnormal movements Psychiatric:  . Mental status o Mood, affect appropriate  I have personally reviewed the following:   Labs:  BMP unremarkable  Mg, phos WNL  AST 62 on admission, remainder of LFTs unremarkable  Hgb stable 10.5  TSH WNL  U/A positive but many SE   Scheduled Meds: . aspirin  81 mg Per J Tube Daily  . baclofen  10 mg Per J Tube TID  . Eluxadoline  100 mg Per J Tube BID  . ergocalciferol  48,000 Units Per Tube Weekly  . feeding supplement (ENSURE ENLIVE)  237 mL Oral BID BM  . gabapentin  300 mg Per Tube TID  . heparin  5,000 Units Subcutaneous Q8H  . pantoprazole  40 mg Oral Daily  . sodium chloride flush  3 mL Intravenous Q12H   Continuous Infusions: . sodium chloride    . cefTRIAXone (ROCEPHIN)  IV Stopped (05/16/17 0654)    Active Problems:   GERD (gastroesophageal reflux disease)   Spinal stenosis of lumbar region   Essential hypertension   Acute encephalopathy   UTI (urinary tract infection)   LOS: 0 days

## 2017-05-17 ENCOUNTER — Encounter: Payer: Self-pay | Admitting: Radiation Oncology

## 2017-05-17 ENCOUNTER — Telehealth: Payer: Self-pay | Admitting: *Deleted

## 2017-05-17 NOTE — Telephone Encounter (Signed)
CALLED PATIENT TO INFORM OF CT FOR 05-20-17 - ARRIVAL TIME - 1:15 PM - PT. TO BE NPO- 4 HRS. PRIOR TO TEST, PT. TO PICK -UP CONTRAST DAY BEFORE FOR SCAN, LVM FOR  A RETURN CALL, TEST TO BE @ [facility], LVM FOR A RETURN CALL

## 2017-05-18 LAB — URINE CULTURE: Culture: >=100000 — AB

## 2017-05-20 ENCOUNTER — Other Ambulatory Visit: Payer: Self-pay | Admitting: Family Medicine

## 2017-05-20 ENCOUNTER — Ambulatory Visit (HOSPITAL_COMMUNITY): Payer: BC Managed Care – PPO

## 2017-05-20 DIAGNOSIS — K580 Irritable bowel syndrome with diarrhea: Secondary | ICD-10-CM

## 2017-05-21 ENCOUNTER — Encounter: Payer: Self-pay | Admitting: Family Medicine

## 2017-05-23 ENCOUNTER — Telehealth: Payer: Self-pay | Admitting: Nutrition

## 2017-05-23 ENCOUNTER — Ambulatory Visit: Payer: BC Managed Care – PPO | Attending: Radiation Oncology

## 2017-05-23 ENCOUNTER — Ambulatory Visit (HOSPITAL_COMMUNITY)
Admission: RE | Admit: 2017-05-23 | Discharge: 2017-05-23 | Disposition: A | Discharge status: Home / Self Care | Payer: BC Managed Care – PPO | Source: Ambulatory Visit | Attending: Radiation Oncology | Admitting: Radiation Oncology

## 2017-05-23 ENCOUNTER — Encounter (HOSPITAL_COMMUNITY): Payer: Self-pay

## 2017-05-23 DIAGNOSIS — N281 Cyst of kidney, acquired: Secondary | ICD-10-CM | POA: Insufficient documentation

## 2017-05-23 DIAGNOSIS — Z981 Arthrodesis status: Secondary | ICD-10-CM | POA: Diagnosis not present

## 2017-05-23 DIAGNOSIS — M4185 Other forms of scoliosis, thoracolumbar region: Secondary | ICD-10-CM | POA: Insufficient documentation

## 2017-05-23 DIAGNOSIS — C01 Malignant neoplasm of base of tongue: Secondary | ICD-10-CM | POA: Insufficient documentation

## 2017-05-23 DIAGNOSIS — C0680 Malignant neoplasm of overlapping sites of unspecified parts of mouth: Secondary | ICD-10-CM | POA: Diagnosis not present

## 2017-05-23 DIAGNOSIS — R1312 Dysphagia, oropharyngeal phase: Secondary | ICD-10-CM

## 2017-05-23 MED ORDER — IOHEXOL 300 MG/ML  SOLN
100.0000 mL | Freq: Once | INTRAMUSCULAR | Status: AC | PRN
  Administered 2017-05-23: 100 mL via INTRAVENOUS

## 2017-05-23 NOTE — Therapy (Signed)
[facility] [address] [place], [place], [address] Phone: [phone]   Fax:  [phone]  Speech Language Pathology Treatment  Patient Details  Name: Kristen [name] MRN: [id number] Date of Birth: 01/24/1965 Referring Provider: Squire, Sarah, MD   Encounter Date: 05/23/2017  End of Session - 05/23/17 1359    Visit Number  5    Number of Visits  7    Date for SLP Re-Evaluation  06/10/17    SLP Start Time  1204    SLP Stop Time   1232    SLP Time Calculation (min)  28 min    Activity Tolerance  Patient tolerated treatment well       Past Medical History:  Diagnosis Date  . Allergy   . Anemia    Iron deficiency  . Anxiety   . Arthritis    back, left shoulder  . Cancer (HCC)   . Chicken pox   . Depression   . Diarrhea    takes Imodium daily  . GERD (gastroesophageal reflux disease)   . H/O hiatal hernia   . Headache(784.0)   . History of kidney stones    1996ish  . History of radiation therapy 11/16/16- 01/01/17   Base of Tongue/ 66 gy in 33 fractions/ Dose: 2 Gy  . Hypertension   . Migraine    none for 5 years (as of 01/13/16)  . OSA (obstructive sleep apnea) 10/13/2015   unable to get cpap, plans to get one in 2018  . Pneumonia   . Restless legs   . Scoliosis   . Shortness of breath    with exertion    Past Surgical History:  Procedure Laterality Date  . BACK SURGERY  07/21/1978  . CHOLECYSTECTOMY N/A 08/15/2013   Procedure: LAPAROSCOPIC CHOLECYSTECTOMY WITH INTRAOPERATIVE CHOLANGIOGRAM;  Surgeon: James O Wyatt, MD;  Location: [facility] OR;  Service: General;  Laterality: N/A;  . COLONOSCOPY N/A 01/09/2013   Procedure: COLONOSCOPY;  Surgeon: Patrick D Hung, MD;  Location: [facility];  Service: Endoscopy;  Laterality: N/A;  . DIRECT LARYNGOSCOPY  07/2016   Dr. Waltonen [facility]  . GASTROSTOMY TUBE PLACEMENT  09/27/2016  . HERNIA REPAIR Left 1981  . IR PATIENT EVAL TECH 0-60 MINS  12/13/2016  . IR  PATIENT EVAL TECH 0-60 MINS  04/11/2017  . MODIFIED RADICAL NECK DISSECTION Left 09/27/2016   Levels 1 & 2  . PARTIAL GLOSSECTOMY Left 09/27/2016   Left hemi partial glossectomy  . TONSILLECTOMY    . tracheotomy  09/27/2016  . TUBAL LIGATION  06/1988    There were no vitals filed for this visit.  Subjective Assessment - 05/23/17 1207    Subjective  "It's (nasal regurgitation) not as bad now."    Currently in Pain?  Yes    Pain Score  4     Pain Location  Neck    Pain Orientation  Left;Mid;Upper    Pain Descriptors / Indicators  Tightness;Sore    Pain Type  Chronic pain    Pain Onset  More than a month ago    Pain Frequency  Intermittent    Aggravating Factors   not moving my head    Pain Relieving Factors  stretching            ADULT SLP TREATMENT - 05/23/17 1210      General Information   Behavior/Cognition  Cooperative;Pleasant mood;Alert      Treatment Provided   Treatment provided  Dysphagia        Dysphagia Treatment   Temperature Spikes Noted  No    Respiratory Status  Room air    Oral Cavity - Dentition  Edentulous    Treatment Methods  Skilled observation;Therapeutic exercise;Compensation strategy training    Patient observed directly with PO's  Yes    Type of PO's observed  Dysphagia 3 (soft);Thin liquids    Oral Phase Signs & Symptoms  Prolonged mastication    Pharyngeal Phase Signs & Symptoms  -- none noted, nor reported    Other treatment/comments  Pt denies overt s/s apiration PNA. Pt ate chicken yesterday - has primarily soft foods still. Pt stated doing HEP about 4 times/week and at least once with twice a day. SLP told pt she was going to have to perform HEP twice daily every day. SLP suggested pt take pain meds (even OTC) and then perform HEP. SLP provided cues for vocal adduction only. SLP reviewed precautions from modified 04-21-17 and reiterated food journal facts and overt s/s aspiration PNA.       Assessment / Recommendations / Plan   Plan  Continue  with current plan of care      Dysphagia Recommendations   Diet recommendations  Dysphagia 3 (mechanical soft);Thin liquid    Liquids provided via  Cup    Medication Administration  Via alternative means    Compensations  Slow rate;Small sips/bites;Multiple dry swallows after each bite/sip;Follow solids with liquid    Postural Changes and/or Swallow Maneuvers  -- lingual sweep      Progression Toward Goals   Progression toward goals  Progressing toward goals       SLP Education - 05/23/17 1358    Education provided  Yes    Education Details  modifications with meals-from modified 04-21-17, needs to do HEP x2/day everyday, hold time on laryngeal adduction, overt s/s aspiration pNA, food journal    Person(s) Educated  Patient    Methods  Explanation;Verbal cues;Demonstration;Handout    Comprehension  Verbalized understanding;Returned demonstration;Verbal cues required;Need further instruction       SLP Short Term Goals - 05/23/17 1400      SLP SHORT TERM GOAL #1   Title  pt will complete HEP with rare min A over two sessions    Status  Partially Met one visist      SLP SHORT TERM GOAL #2   Title  pt will tell SLP why she is completing HEP     Status  Achieved      SLP SHORT TERM GOAL #3   Title  pt will tell why a food journal can be helpful in returning to pre-radiation diet    Status  Deferred      SLP SHORT TERM GOAL #4   Title  pt will provide SLP 3 overt s/s aspiration PNA with modified independence    Status  Achieved       SLP Long Term Goals - 05/23/17 1228      SLP LONG TERM GOAL #1   Title  pt will complete HEP with modified independence over three sessions     Period  -- visits    Status  Achieved      SLP LONG TERM GOAL #2   Title  pt will tell SLP when HEP frequency can be reduced to x2-3/week    Time  2    Period  -- visits    Status  On-going      SLP LONG TERM GOAL #3   Title  pt will  tell SLP 3 overt s/s of aspiration PNA with modified  independence over three sessions    Baseline  04-18-17, 05-23-17    Time  2    Period  -- visits    Status  On-going      SLP LONG TERM GOAL #4   Title  pt will tell how food journal can assist return to more normal POs    Time  2    Period  -- visits    Status  Revised       Plan - 05/23/17 1234    Clinical Impression Statement  Pt with improving oral and pharyngeal stages reported. Has tried meats sporadically. Pt reports she has been completing HEP one to twice a week, x4/week. SLP told her to incr frequency as directed by SLP. See modified results of 04-21-17 - no aspiration but flash penetration. No nasal regurgitation - a dys III with thin liquids was recommended, with modifications to pt's eating (slow rate, small bites/sips, alternate food/liquid, multiple swallows/bite, and a lingual sweep to check for oral residuals. The probability of swallowing difficulty remains high with the initiation of radiation therapy. Pt does not report any overt s/s aspiration PNA to date. Pt will need to cont to be followed by SLP for regular assessment of accurate HEP completion as well as for safety with POs both during and following treatment/s.    Speech Therapy Frequency  -- approx once every 4 weeks    Duration  -- 6 visits    Treatment/Interventions  Aspiration precaution training;Pharyngeal strengthening exercises;Diet toleration management by SLP;Trials of upgraded texture/liquids;Internal/external aids;Patient/family education;Compensatory strategies;SLP instruction and feedback;Cueing hierarchy;Environmental controls    Potential to Achieve Goals  Good       Patient will benefit from skilled therapeutic intervention in order to improve the following deficits and impairments:   Dysphagia, oropharyngeal    Problem List Patient Active Problem List   Diagnosis Date Noted  . Acute encephalopathy 05/15/2017  . UTI (urinary tract infection) 05/15/2017  . Hypotension 02/10/2017  . Adenoid cystic  carcinoma of head and neck ([place]) 10/29/2016  . Malignant neoplasm of base of tongue ([place]) 10/29/2016  . 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[place]) 10/29/2016  . Headache associated with sexual activity 05/14/2016  . Tongue lesion 05/14/2016  . Loose stools 03/12/2016  . Hypocalcemia 01/22/2016  . Thrombocytopenia ([place]) 01/22/2016  . Chronic diarrhea 01/22/2016  . Chest pain   . Essential hypertension   . Orthostatic hypotension   . Spinal stenosis of lumbar region 01/20/2016    Class: Chronic  . DDD (degenerative disc disease), lumbar 01/20/2016    Class: Chronic  . Spinal stenosis of lumbar region with neurogenic claudication 01/20/2016  . Low back pain 12/22/2015  . Pre-op examination 12/22/2015  . OSA (obstructive sleep apnea) 10/13/2015  . Muscle cramp 08/01/2015  . GERD (gastroesophageal reflux disease) 07/09/2015  . Excessive daytime sleepiness 06/18/2015  . Shingles 08/28/2013  . Postop check 08/28/2013  . Cholelithiasis 07/10/2013  . Colitis 01/08/2013  . Postoperative anemia 01/08/2013  . Morbid obesity with BMI of 40.0-44.9, adult ([place]) 01/08/2013    [facility] ,[place], [place]  05/23/2017, 2:00 PM  [place] [address] [place], [place], [address] Phone: [phone]   Fax:  [phone]   Name: WALLY BEHAN MRN: [id number] Date of Birth: [birth year]

## 2017-05-23 NOTE — Telephone Encounter (Signed)
Patient requested phone call to schedule follow-up appointment. Patient requires additional samples of Osmolite 1.5. She picked up one case today. Contacted patient to make appointment. She is requesting an appointment between 10 AM and 12 noon. Offered appointment on Wednesday, May 8.

## 2017-05-24 ENCOUNTER — Other Ambulatory Visit: Payer: Self-pay | Admitting: Radiation Oncology

## 2017-05-24 DIAGNOSIS — C01 Malignant neoplasm of base of tongue: Secondary | ICD-10-CM

## 2017-05-24 DIAGNOSIS — N2889 Other specified disorders of kidney and ureter: Secondary | ICD-10-CM

## 2017-05-24 NOTE — Progress Notes (Signed)
ICD-10-CM   1. Malignant neoplasm of base of tongue ([place]) C01 Ambulatory referral to Dentistry    Ambulatory referral to Physical Therapy  2. Kidney scarring N28.89 Ambulatory referral to Urology    I called patient with reassuring CT results.   She is seeing nutritionist to delve deeper into addressing weight loss.  Referral to urology for chronic kidney scarring, to PT for neck edema and deconditioning, and to dentistry and she expresses desire for dentures.  I will see her in 77mo  Ct Soft Tissue Neck W Contrast  Result Date: 05/23/2017 CLINICAL DATA:  Malignant neoplasm base of tongue EXAM: CT NECK WITH CONTRAST TECHNIQUE: Multidetector CT imaging of the neck was performed using the standard protocol following the bolus administration of intravenous contrast. CONTRAST:  1022mOMNIPAQUE IOHEXOL 300 MG/ML  SOLN COMPARISON:  CT neck 06/30/2016 FINDINGS: Pharynx and larynx: Interval partial glossectomy on the left. This is the initial postop CT and will serve as a baseline for future reference. No obvious recurrent tumor in the tongue. Mild edema involving the larynx from radiation change. Airway intact. Salivary glands: Left submandibular gland resected. Atrophic changes in the right submandibular gland compatible with radiation change. Parotid gland normal bilaterally. Thyroid: Left thyroid calcification unchanged. 5 mm nodule right midpole unchanged. No thyroid mass. Lymph nodes: Lymph node dissection on the left with soft tissue thickening and multiple surgical clips. No pathologic adenopathy in the neck. Vascular: Segmental occlusion of the left jugular vein proximally likely due to surgical ligation. Right jugular vein patent. Limited intracranial: Negative Visualized orbits: Negative Mastoids and visualized paranasal sinuses: Mild mucosal edema left maxillary sinus. Remaining sinuses clear. Mastoid clear bilaterally. Skeleton: Cervical degenerative changes and spondylosis. No acute skeletal  abnormality. Upper chest: Chest CT from today reported separately Other: Edema in the anterior soft tissues of the neck related to radiation change IMPRESSION: Hemiglossectomy on the left. This will serve as baseline study for future imaging. No obvious recurrent tumor identified. No adenopathy. Radiation changes anterior neck. Surgical resection left submandibular gland. Segmental occlusion proximal left jugular vein, felt to be postsurgical. Electronically Signed   By: ChFranchot Gallo.D.   On: 05/23/2017 13:06   Ct Chest W Contrast  Result Date: 05/23/2017 CLINICAL DATA:  HEAD AND NECK CANCER EXAM: CT CHEST, ABDOMEN, AND PELVIS WITH CONTRAST TECHNIQUE: Multidetector CT imaging of the chest, abdomen and pelvis was performed following the standard protocol during bolus administration of intravenous contrast. CONTRAST:  10050mMNIPAQUE IOHEXOL 300 MG/ML  SOLN COMPARISON:  CT CHEST 10/25/2016.  ABDOMEN AND PELVIS CT 01/08/2013. FINDINGS: CT CHEST FINDINGS Cardiovascular: The heart size is normal.  No pericardial effusion. Mediastinum/Nodes: 6 mm left thoracic inlet lymph node is unchanged in the interval. No mediastinal lymphadenopathy. There is no hilar lymphadenopathy. The esophagus has normal imaging features. There is no axillary lymphadenopathy. Tiny bilateral thyroid nodules are stable. Lungs/Pleura: Tracheostomy tube is been removed in the interval. The central tracheobronchial airways are patent. No suspicious pulmonary nodule or mass. No focal airspace consolidation. No pulmonary edema or pleural effusion. Musculoskeletal: Bone windows reveal no worrisome lytic or sclerotic osseous lesions. CT ABDOMEN PELVIS FINDINGS Hepatobiliary: No focal abnormality within the liver parenchyma. Gallbladder surgically absent. No intrahepatic or extrahepatic biliary dilation. Pancreas: No focal mass lesion. No dilatation of the main duct. No intraparenchymal cyst. No peripancreatic edema. Spleen: No splenomegaly. No  focal mass lesion. Adrenals/Urinary Tract: No adrenal nodule or mass. Areas of cortical scarring are seen in the kidneys bilaterally, similar to the 2014  exam. Multiple caliceal diverticuli are noted bilaterally. 2.7 cm cyst in the upper pole the left kidney has increased in size in the interval. No evidence for hydroureter. The urinary bladder appears normal for the degree of distention. Stomach/Bowel: Gastrostomy tube tip is positioned in the stomach. Duodenum is normally positioned as is the ligament of Treitz. No small bowel wall thickening. No small bowel dilatation. The terminal ileum is normal. The appendix is not visualized, but there is no edema or inflammation in the region of the cecum. Diverticular changes are noted in the left colon without evidence of diverticulitis. Vascular/Lymphatic: No abdominal aortic aneurysm. Ectasia of the abdominal aorta noted. There is no gastrohepatic or hepatoduodenal ligament lymphadenopathy. No intraperitoneal or retroperitoneal lymphadenopathy. No pelvic sidewall lymphadenopathy. Reproductive: Uterine fibroids evident.  There is no adnexal mass. Other: No intraperitoneal free fluid. Musculoskeletal: Thoracolumbar scoliosis with extensive thoracolumbar fusion. Bone windows reveal no worrisome lytic or sclerotic osseous lesions. IMPRESSION: 1. No evidence for metastatic disease in the chest, abdomen, or pelvis. 2. Interval removal of tracheostomy tube. 3. Areas of cortical scarring in the kidneys bilaterally with caliceal diverticuli. Imaging features not substantially changed since 01/08/2013. 4. Thoracolumbar scoliosis, status post fusion. Electronically Signed   By: Misty Stanley M.D.   On: 05/23/2017 14:30   Ct Abdomen Pelvis W Contrast  Result Date: 05/23/2017 CLINICAL DATA:  HEAD AND NECK CANCER EXAM: CT CHEST, ABDOMEN, AND PELVIS WITH CONTRAST TECHNIQUE: Multidetector CT imaging of the chest, abdomen and pelvis was performed following the standard protocol during  bolus administration of intravenous contrast. CONTRAST:  139m OMNIPAQUE IOHEXOL 300 MG/ML  SOLN COMPARISON:  CT CHEST 10/25/2016.  ABDOMEN AND PELVIS CT 01/08/2013. FINDINGS: CT CHEST FINDINGS Cardiovascular: The heart size is normal.  No pericardial effusion. Mediastinum/Nodes: 6 mm left thoracic inlet lymph node is unchanged in the interval. No mediastinal lymphadenopathy. There is no hilar lymphadenopathy. The esophagus has normal imaging features. There is no axillary lymphadenopathy. Tiny bilateral thyroid nodules are stable. Lungs/Pleura: Tracheostomy tube is been removed in the interval. The central tracheobronchial airways are patent. No suspicious pulmonary nodule or mass. No focal airspace consolidation. No pulmonary edema or pleural effusion. Musculoskeletal: Bone windows reveal no worrisome lytic or sclerotic osseous lesions. CT ABDOMEN PELVIS FINDINGS Hepatobiliary: No focal abnormality within the liver parenchyma. Gallbladder surgically absent. No intrahepatic or extrahepatic biliary dilation. Pancreas: No focal mass lesion. No dilatation of the main duct. No intraparenchymal cyst. No peripancreatic edema. Spleen: No splenomegaly. No focal mass lesion. Adrenals/Urinary Tract: No adrenal nodule or mass. Areas of cortical scarring are seen in the kidneys bilaterally, similar to the 2014 exam. Multiple caliceal diverticuli are noted bilaterally. 2.7 cm cyst in the upper pole the left kidney has increased in size in the interval. No evidence for hydroureter. The urinary bladder appears normal for the degree of distention. Stomach/Bowel: Gastrostomy tube tip is positioned in the stomach. Duodenum is normally positioned as is the ligament of Treitz. No small bowel wall thickening. No small bowel dilatation. The terminal ileum is normal. The appendix is not visualized, but there is no edema or inflammation in the region of the cecum. Diverticular changes are noted in the left colon without evidence of  diverticulitis. Vascular/Lymphatic: No abdominal aortic aneurysm. Ectasia of the abdominal aorta noted. There is no gastrohepatic or hepatoduodenal ligament lymphadenopathy. No intraperitoneal or retroperitoneal lymphadenopathy. No pelvic sidewall lymphadenopathy. Reproductive: Uterine fibroids evident.  There is no adnexal mass. Other: No intraperitoneal free fluid. Musculoskeletal: Thoracolumbar scoliosis with extensive thoracolumbar fusion.  Bone windows reveal no worrisome lytic or sclerotic osseous lesions. IMPRESSION: 1. No evidence for metastatic disease in the chest, abdomen, or pelvis. 2. Interval removal of tracheostomy tube. 3. Areas of cortical scarring in the kidneys bilaterally with caliceal diverticuli. Imaging features not substantially changed since 01/08/2013. 4. Thoracolumbar scoliosis, status post fusion. Electronically Signed   By: Misty Stanley M.D.   On: 05/23/2017 14:30   -----------------------------------  Eppie Gibson, MD

## 2017-05-25 ENCOUNTER — Telehealth (HOSPITAL_COMMUNITY): Payer: Self-pay

## 2017-05-26 ENCOUNTER — Telehealth: Payer: Self-pay | Admitting: *Deleted

## 2017-05-26 ENCOUNTER — Encounter: Payer: Self-pay | Admitting: Family Medicine

## 2017-05-26 ENCOUNTER — Ambulatory Visit: Payer: BC Managed Care – PPO | Admitting: Family Medicine

## 2017-05-26 VITALS — BP 100/76 | HR 84 | Temp 98.6°F | Ht 69.0 in | Wt 186.0 lb

## 2017-05-26 DIAGNOSIS — K580 Irritable bowel syndrome with diarrhea: Secondary | ICD-10-CM | POA: Diagnosis not present

## 2017-05-26 DIAGNOSIS — R251 Tremor, unspecified: Secondary | ICD-10-CM | POA: Diagnosis not present

## 2017-05-26 DIAGNOSIS — N390 Urinary tract infection, site not specified: Secondary | ICD-10-CM

## 2017-05-26 LAB — POCT URINALYSIS DIPSTICK
BILIRUBIN UA: NEGATIVE
Glucose, UA: NEGATIVE
Ketones, UA: NEGATIVE
NITRITE UA: NEGATIVE
PH UA: 7.5 (ref 5.0–8.0)
Protein, UA: NEGATIVE
RBC UA: NEGATIVE
Spec Grav, UA: 1.02 (ref 1.010–1.025)
UROBILINOGEN UA: 0.2 U/dL

## 2017-05-26 LAB — GLUCOSE, POCT (MANUAL RESULT ENTRY): POC Glucose: 87 mg/dl (ref 70–99)

## 2017-05-26 MED ORDER — POTASSIUM 99 MG PO TABS: 1.0000 | ORAL_TABLET | Freq: Every day | ORAL | 1 refills | Status: DC

## 2017-05-26 MED ORDER — GABAPENTIN 300 MG PO CAPS: 300.0000 mg | ORAL_CAPSULE | Freq: Three times a day (TID) | ORAL | 3 refills | Status: DC

## 2017-05-26 MED ORDER — ELUXADOLINE 100 MG PO TABS: 100.0000 mg | ORAL_TABLET | Freq: Two times a day (BID) | ORAL | 0 refills | Status: DC

## 2017-05-26 NOTE — Telephone Encounter (Signed)
CALLED PATIENT TO INFORM OF PT APPT. BEING MOVED TO 06-06-17 - ARRIVAL TIME - 10:45 AM @ [facility] @ [place], SPOKE WITH PATIENT AND SHE IS AWARE OF THIS APPT.

## 2017-05-26 NOTE — Progress Notes (Signed)
Subjective:    Patient ID: Kristen [name], female    DOB: 01/15/1966, 52 y.o.   MRN: [id number]  Chief Complaint  Patient presents with  . Hospitalization Follow-up    HPI Patient was seen today for hospital follow-up.  Patient was admitted to the hospital for UTI on 4/28 through 05/16/2017.  Patient was initially given ceftriaxone then switched to Ceftin p.o.  Patient completed antibiotic course.  Of note patient's QTC was prolonged but resolved the next day on EKG.  Patient also had elevation in LFTs for unclear etiology.  Pt states she has been doing well since discharge from the hospital.  Symptoms have improved.  At this visit patient endorses feeling slightly shaky.  FSBS noted to be 87.  Pt was given water and a soft granola bar.  Pt requesting refill on Viberzi for IBS.Marland Kitchen  She states when she does not take the medication she has issues with diarrhea.  Pt has been out for several days.  Patient endorses occasionally feeling depressed about her overall health situation and her appearance.  Patient endorses going to group counseling at the cancer center but is considering doing individual therapy. Past Medical History:  Diagnosis Date  . Allergy   . Anemia    Iron deficiency  . Anxiety   . Arthritis    back, left shoulder  . Cancer ([place])   . Chicken pox   . Depression   . Diarrhea    takes Imodium daily  . GERD (gastroesophageal reflux disease)   . H/O hiatal hernia   . Headache(784.0)   . History of kidney stones    1996ish  . History of radiation therapy 11/16/16- 01/01/17   Base of Tongue/ 66 gy in 33 fractions/ Dose: 2 Gy  . Hypertension   . Migraine    none for 5 years (as of 01/13/16)  . OSA (obstructive sleep apnea) 10/13/2015   unable to get cpap, plans to get one in 2018  . Pneumonia   . Restless legs   . Scoliosis   . Shortness of breath    with exertion    Allergies  Allergen Reactions  . Other Hives and Swelling    tomato  . Sulfur Hives and  Swelling    ROS General: Denies fever, chills, night sweats, changes in weight, changes in appetite  +feeling shaky HEENT: Denies headaches, ear pain, changes in vision, rhinorrhea, sore throat CV: Denies CP, palpitations, SOB, orthopnea Pulm: Denies SOB, cough, wheezing GI: Denies abdominal pain, nausea, vomiting, constipation  +diarrhea GU: Denies dysuria, hematuria, frequency, vaginal discharge Msk: Denies muscle cramps, joint pains Neuro: Denies weakness, numbness, tingling Skin: Denies rashes, bruising Psych: Denies anxiety, hallucinations  + depression     Objective:    Blood pressure 100/76, pulse 84, temperature 98.6 F (37 C), temperature source Oral, height 5' 9"  (1.753 m), weight 186 lb (84.4 kg), last menstrual period 11/15/2013.   Gen. Pleasant, well-nourished, in no distress, normal affect   HEENT: [place]/AT, face symmetric, no scleral icterus, PERRLA, nares patent without drainage, pharynx without erythema or exudate. Lungs: no accessory muscle use, CTAB, no wheezes or rales Cardiovascular: RRR, no m/r/g, no peripheral edema Abdomen: BS present, soft, NT/ND, g-tube in place Neuro:  A&Ox3, CN II-XII intact, normal gait Skin:  Warm, no lesions/ rash   Wt Readings from Last 3 Encounters:  05/26/17 186 lb (84.4 kg)  05/15/17 184 lb (83.5 kg)  05/13/17 188 lb 3.2 oz (85.4 kg)    Lab Results  Component Value Date   WBC 2.9 (L) 05/16/2017   HGB 10.5 (L) 05/16/2017   HCT 33.6 (L) 05/16/2017   PLT 257 05/16/2017   GLUCOSE 83 05/16/2017   ALT 52 05/15/2017   AST 62 (H) 05/15/2017   NA 142 05/16/2017   K 3.7 05/16/2017   CL 110 05/16/2017   CREATININE 0.64 05/16/2017   BUN 10 05/16/2017   CO2 25 05/16/2017   TSH 1.614 05/13/2017   INR 0.94 01/13/2016   HGBA1C 5.6 06/18/2015    Assessment/Plan:  Urinary tract infection without hematuria, site unspecified  -Continue drinking plenty of fluids - Plan: POC Urinalysis Dipstick  Shaky -Likely 2/2 hypoglycemia  as FSBS was 87 in clinic. -Improving after eating - Plan: POC Glucose (CBG)  Irritable bowel syndrome with diarrhea -We will send in refills for Viberzi to patient's pharmacy.  Patient given information on area counseling services.  Advised to schedule appointment.  Follow-up PRN in the next few months  Grier Mitts, MD

## 2017-05-26 NOTE — Telephone Encounter (Signed)
CALLED PATIENT TO INFORM OF DENTIST'S APPT. ON 06-07-17 - ARRIVAL TIME - 10:30 AM @ DR. STOKES' OFFICE, SPOKE WITH PATIENT AND SHE IS AWARE OF THIS APPT.

## 2017-05-27 ENCOUNTER — Other Ambulatory Visit: Payer: Self-pay | Admitting: Family Medicine

## 2017-05-27 ENCOUNTER — Encounter: Payer: Self-pay | Admitting: Family Medicine

## 2017-05-27 ENCOUNTER — Encounter: Payer: Self-pay | Admitting: *Deleted

## 2017-05-27 DIAGNOSIS — K580 Irritable bowel syndrome with diarrhea: Secondary | ICD-10-CM

## 2017-05-27 MED ORDER — ELUXADOLINE 100 MG PO TABS: 100.0000 mg | ORAL_TABLET | Freq: Two times a day (BID) | ORAL | 0 refills | Status: DC

## 2017-05-27 NOTE — Progress Notes (Signed)
E-scribe of gabapentin to [facility] failed.  Medication phoned to pharmacy instead. In [place], gabapentin is a controlled substance and rx is only good for 6 months, so they were only able to add 1 refill.  No further needs at this time.

## 2017-05-30 ENCOUNTER — Encounter: Payer: Self-pay | Admitting: Family Medicine

## 2017-05-30 ENCOUNTER — Encounter: Payer: Self-pay | Admitting: Internal Medicine

## 2017-05-31 ENCOUNTER — Other Ambulatory Visit: Payer: Self-pay | Admitting: Internal Medicine

## 2017-05-31 DIAGNOSIS — G4733 Obstructive sleep apnea (adult) (pediatric): Secondary | ICD-10-CM

## 2017-05-31 NOTE — Telephone Encounter (Signed)
Attempted to call patient. Home phone number is not accepting incoming calls. Called patient's work number and was told "she just pulled off in her bus" and is not available.  Will attempt call back at another time.

## 2017-06-01 ENCOUNTER — Telehealth: Payer: Self-pay | Admitting: Family Medicine

## 2017-06-01 ENCOUNTER — Encounter: Payer: Self-pay | Admitting: Family Medicine

## 2017-06-01 ENCOUNTER — Encounter: Payer: Self-pay | Admitting: Physical Therapy

## 2017-06-01 ENCOUNTER — Encounter: Payer: Self-pay | Admitting: Internal Medicine

## 2017-06-01 NOTE — Telephone Encounter (Signed)
Noted. Chart has been updated.

## 2017-06-01 NOTE — Telephone Encounter (Signed)
Copied from [place] [phone]. Topic: General - Other >> Jun 01, 2017 10:50 AM Yvette Rack wrote: Reason for CRM: Anderson Malta with [facility] called in stating the pt phone number has been updated. Anderson Malta states the pt can be reached at the home number [phone].

## 2017-06-02 ENCOUNTER — Ambulatory Visit: Payer: BC Managed Care – PPO | Admitting: Physical Therapy

## 2017-06-02 NOTE — Telephone Encounter (Signed)
Called patient and left message to return call

## 2017-06-02 NOTE — Telephone Encounter (Signed)
Patient said she is returning a call to the office. She said said is unware of who called but thinks it could be in regards to this. Please advise

## 2017-06-02 NOTE — Telephone Encounter (Signed)
See 05/30/17 MyChart encounter.

## 2017-06-03 NOTE — Telephone Encounter (Signed)
Called patient and left message to return call

## 2017-06-03 NOTE — Progress Notes (Signed)
[place] [facility]  Clinical Social Work was referred by patient  to review and complete healthcare advance directives.  Clinical Social Worker met with patient and patient's mother in [place] office.  The patient designated mother, Rhoderick Moody, as their primary healthcare agent and daughter, Gloriajean Dell, as their secondary agent.  Patient also completed healthcare living will.    Clinical Social Worker notarized documents and made copies for patient/family. Clinical Social Worker will send documents to medical records to be scanned into patient's chart. Clinical Social Worker encouraged patient/family to contact with any additional questions or concerns.  Maryjean Morn, MSW, LCSW Clinical Social Worker [facility] [phone]

## 2017-06-06 ENCOUNTER — Encounter: Payer: Self-pay | Admitting: Physical Therapy

## 2017-06-06 ENCOUNTER — Ambulatory Visit: Payer: BC Managed Care – PPO | Attending: Radiation Oncology | Admitting: Physical Therapy

## 2017-06-06 ENCOUNTER — Other Ambulatory Visit: Payer: Self-pay | Admitting: Radiation Oncology

## 2017-06-06 ENCOUNTER — Other Ambulatory Visit: Payer: Self-pay

## 2017-06-06 DIAGNOSIS — C01 Malignant neoplasm of base of tongue: Secondary | ICD-10-CM

## 2017-06-06 DIAGNOSIS — M6281 Muscle weakness (generalized): Secondary | ICD-10-CM | POA: Diagnosis present

## 2017-06-06 DIAGNOSIS — R279 Unspecified lack of coordination: Secondary | ICD-10-CM | POA: Insufficient documentation

## 2017-06-06 NOTE — Telephone Encounter (Signed)
Called patient and left message to return call

## 2017-06-06 NOTE — Therapy (Signed)
[facility] [address], [place], [place], [address] Phone: [phone]   Fax:  [phone]  Physical Therapy Evaluation  Patient Details  Name: KYLIA GRAJALES MRN: [id number] Date of Birth: [birth year] Referring Provider: Dr. Eppie Gibson   Encounter Date: 06/06/2017  PT End of Session - 06/06/17 1154    Visit Number  1    Date for PT Re-Evaluation  08/01/17    Authorization Type  BCBS    PT Start Time  1100    PT Stop Time  1145    PT Time Calculation (min)  45 min    Activity Tolerance  Patient tolerated treatment well    Behavior During Therapy  [facility] for tasks assessed/performed       Past Medical History:  Diagnosis Date  . Allergy   . Anemia    Iron deficiency  . Anxiety   . Arthritis    back, left shoulder  . Cancer ([place])   . Chicken pox   . Depression   . Diarrhea    takes Imodium daily  . GERD (gastroesophageal reflux disease)   . H/O hiatal hernia   . Headache(784.0)   . History of kidney stones    1996ish  . History of radiation therapy 11/16/16- 01/01/17   Base of Tongue/ 66 gy in 33 fractions/ Dose: 2 Gy  . Hypertension   . Migraine    none for 5 years (as of 01/13/16)  . OSA (obstructive sleep apnea) 10/13/2015   unable to get cpap, plans to get one in 2018  . Pneumonia   . Restless legs   . Scoliosis   . Shortness of breath    with exertion    Past Surgical History:  Procedure Laterality Date  . BACK SURGERY  07/21/1978  . CHOLECYSTECTOMY N/A 08/15/2013   Procedure: LAPAROSCOPIC CHOLECYSTECTOMY WITH INTRAOPERATIVE CHOLANGIOGRAM;  Surgeon: Gwenyth Ober, MD;  Location: [facility];  Service: General;  Laterality: N/A;  . COLONOSCOPY N/A 01/09/2013   Procedure: COLONOSCOPY;  Surgeon: Beryle Beams, MD;  Location: [place];  Service: Endoscopy;  Laterality: N/A;  . DIRECT LARYNGOSCOPY  07/2016   Dr. Nicolette Bang [facility]  . GASTROSTOMY TUBE PLACEMENT  09/27/2016  . HERNIA REPAIR Left  1981  . IR PATIENT EVAL TECH 0-60 MINS  12/13/2016  . IR PATIENT EVAL TECH 0-60 MINS  04/11/2017  . MODIFIED RADICAL NECK DISSECTION Left 09/27/2016   Levels 1 & 2  . PARTIAL GLOSSECTOMY Left 09/27/2016   Left hemi partial glossectomy  . SPINE SURGERY  01/20/2016   fusion  . TONSILLECTOMY    . tracheotomy  09/27/2016  . TUBAL LIGATION  06/1988    There were no vitals filed for this visit.   Subjective Assessment - 06/06/17 1106    Subjective  Patient reports urinary leakage started 10 years ago.  Patient wears 1 pad per day.  Sudden onset of urinary leakage. I am having problem with my balance.     Pertinent History  Diagnosis is adenoid cystic carcinoma.  Had left hemiglossectomy and resection of base of tongue 09/27/16 at [facility]; had trach placed and recently removed.  Plan is for adjuvant RT to surgical bed, 33 fractions.    Patient Stated Goals  reduce urinary leakage    Currently in Pain?  Yes    Pain Score  7     Pain Location  Flank    Pain Orientation  Right    Pain Descriptors /  Indicators  Pressure    Pain Type  Acute pain    Pain Onset  Today    Pain Frequency  Intermittent    Aggravating Factors   move the trunk to the right    Pain Relieving Factors  bend to the left    Multiple Pain Sites  No         [facility] PT Assessment - 06/06/17 0001      Assessment   Medical Diagnosis  C01 Malignant neoplasm o fbase of tongue    Referring Provider  Dr. Eppie Gibson    Onset Date/Surgical Date  52/20/09      Precautions   Precautions  Other (comment)    Precaution Comments  cancer precautions      Restrictions   Weight Bearing Restrictions  No      Balance Screen   Has the patient fallen in the past 6 months  No    Has the patient had a decrease in activity level because of a fear of falling?   No    Is the patient reluctant to leave their home because of a fear of falling?   No      Home Film/video editor residence    Living Arrangements   Spouse/significant other    Type of [place]  One level      Prior Function   Level of Independence  Independent    Vocation  Full time employment    Catering manager on special needs bus    Leisure  no regular exercise      Cognition   Overall Cognitive Status  Within Functional Limits for tasks assessed      Posture/Postural Control   Posture/Postural Control  Postural limitations    Postural Limitations  Forward head      ROM / Strength   AROM / PROM / Strength  AROM;PROM;Strength      Strength   Right Hip Flexion  4/5    Right Hip Extension  3-/5    Right Hip External Rotation   4/5    Right Hip Internal Rotation  4/5    Right Hip ABduction  3/5    Right Hip ADduction  4/5    Left Hip Extension  3-/5    Left Hip ABduction  3/5    Left Hip ADduction  4/5                Objective measurements completed on examination: See above findings.    Pelvic Floor Special Questions - 06/06/17 0001    Currently Sexually Active  Yes    Marinoff Scale  discomfort that does not affect completion    Urinary Leakage  Yes    Pad use  1 pad per day unless she has a bad leakage; no leakage at night    Activities that cause leaking  With strong urge;Walking;Laughing    Other activities that cause leaking  just happens    Fecal incontinence  -- strain to have a bowel movement; 1 BM per week    Skin Integrity  Intact dry    Prolapse  None    Pelvic Floor Internal Exam  Patient confirms identification and approves PT to assess muscle strength and treatment    Exam Type  Vaginal    Palpation  tenderness located in right obturator internist, left levator ani, tightness in the sphinctor muscle  Strength  weak squeeze, no lift    Strength # of seconds  5    Tone  increased tone               PT Education - 06/06/17 1153    Education provided  Yes    Education Details  education on vaginal moisturizers; gave sample for  lubricnats; pelvic floor exercise    Person(s) Educated  Patient    Methods  Explanation;Demonstration;Verbal cues;Handout    Comprehension  Returned demonstration;Verbalized understanding       PT Short Term Goals - 06/06/17 1207      PT SHORT TERM GOAL #1   Title  pt will be independent with her initial HEP for pelvic floor contraction in hookly    Time  4    Period  Weeks    Status  New    Target Date  07/04/17      PT SHORT TERM GOAL #2   Title  understand what vaginal moisturizers are and how they improve vaginal health    Time  4    Period  Weeks    Status  New    Target Date  07/04/17      PT SHORT TERM GOAL #3   Title  understand how to toilet correctly to decrease straining of the pelvic floor     Time  4    Period  Weeks    Status  New    Target Date  07/04/17        PT Long Term Goals - 06/06/17 1209      PT LONG TERM GOAL #1   Title  Independent with HEP and understand how to progress herself for the pelvic floor    Time  8    Period  Weeks    Status  New    Target Date  08/01/17      PT LONG TERM GOAL #2   Title  pelvic floor strength >/= 3/5 and hold for 10 seconds to reduce urinary leakage >/= 75%    Time  8    Period  Weeks    Status  New    Target Date  08/01/17      PT LONG TERM GOAL #3   Title  ability to toilet without straining 50% of the time due to improved muscle coordination    Time  8    Period  Weeks    Status  New    Target Date  08/01/17      PT LONG TERM GOAL #4   Title  ability to get to the commode without leakage urine due to improve pelvic floor coordination    Time  8    Period  Weeks    Status  New    Target Date  08/01/17      PT LONG TERM GOAL #5   Title  pain with penile penetration decreased >/= 50% due to improve tissue mobility and moisture of the vagina    Time  8    Period  Weeks    Status  New    Target Date  08/01/17         [facility] Neck Clinic Goals - 11/30/16 1253      Patient will be able to  verbalize understanding of a home exercise program for cervical range of motion, posture, and walking.    Status  Achieved      Patient will be able to verbalize understanding  of proper sitting and standing posture.    Status  Achieved      Patient will be able to verbalize understanding of lymphedema risk and availability of treatment for this condition.    Status  Achieved         Plan - 06/06/17 1155    Clinical Impression Statement  Patient is a 52 year old female with urinary incontinence for the past 10 years.  Patient reports she wears one pad per day and none at night. Patient will leak urine with strong urge, on her way to the bathroom, walking, laughing.  Pelvic floor strength is 2/5 with VC to not hold her breath and not contract her buttocks. Patient has tenderness located in the right obturator internist and left levator ani. Patient has weakness in bilateral hips and core. Patient reports weakness lately and difficulty with her balance.  Patient will be having lymphedema and conditioning and balance  treatment at the cancer treatment. Patient reports 7/10 right flank pain that started today.  Patient will benefit from skilled therapy to improve pelvic floor strength, educate on vaginal health, reduce urinaty leakage and pain during intercourse.      History and Personal Factors relevant to plan of care:  left spinal accessory nerve injury; remote H/O back surgery; malignant neoplasm of base of tongue with radiation treatment    Clinical Presentation  Evolving    Clinical Presentation due to:  has to know where the bathrooms are, leaks urine while going to the bathroom, wears pads every day    Clinical Decision Making  Moderate    Rehab Potential  Excellent    Clinical Impairments Affecting Rehab Potential  left spinal accessory nerve injury; remote H/O back surgery; malignant neoplasm of base of tongue with radiation treatment    PT Frequency  1x / week    PT  Duration  8 weeks    PT Treatment/Interventions  Biofeedback;Therapeutic activities;Therapeutic exercise;Balance training;Patient/family education;Neuromuscular re-education;Manual techniques;Energy conservation    PT Next Visit Plan  vaginal lubricators; toileting technique; internal soft tissue work, hip stretches; pelvic floor contraction; bladder irritants;     PT Home Exercise Plan  progress as needed    Recommended Other Services  Will be evaluated at the cancer center for lymphedema, conditioning and balance    Consulted and Agree with Plan of Care  Patient       Patient will benefit from skilled therapeutic intervention in order to improve the following deficits and impairments:  Decreased strength, Pain, Increased fascial restricitons, Decreased activity tolerance, Decreased coordination  Visit Diagnosis: Muscle weakness (generalized) - Plan: PT plan of care cert/re-cert  Unspecified lack of coordination - Plan: PT plan of care cert/re-cert     Problem List Patient Active Problem List   Diagnosis Date Noted  . Acute encephalopathy 05/15/2017  . UTI (urinary tract infection) 05/15/2017  . Hypotension 02/10/2017  . 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 Malignant neoplasm of base of tongue ([place]) 10/29/2016  . 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[place]) 10/29/2016  . Headache associated with sexual activity 05/14/2016  . Tongue lesion 05/14/2016  . Loose stools 03/12/2016  . Hypocalcemia 01/22/2016  . Thrombocytopenia ([place]) 01/22/2016  . Chronic diarrhea 01/22/2016  . Chest pain   . Essential hypertension   . Orthostatic hypotension   . Spinal stenosis of lumbar region 01/20/2016    Class: Chronic  . DDD (degenerative disc disease), lumbar 01/20/2016    Class: Chronic  .  Spinal stenosis of lumbar region with neurogenic claudication 01/20/2016  . Low back pain 12/22/2015  . Pre-op examination 12/22/2015  . OSA (obstructive sleep apnea)  10/13/2015  . Muscle cramp 08/01/2015  . GERD (gastroesophageal reflux disease) 07/09/2015  . Excessive daytime sleepiness 06/18/2015  . Shingles 08/28/2013  . Postop check 08/28/2013  . Cholelithiasis 07/10/2013  . Colitis 01/08/2013  . Postoperative anemia 01/08/2013  . Morbid obesity with BMI of 40.0-44.9, adult [facility]) 01/08/2013    Earlie Counts, PT 06/06/17 12:15 PM   [facility] [address] [place], [place], [address] Phone: [phone]   Fax:  [phone]  Name: LELIA JONS MRN: [id number] Date of Birth: [birth year]

## 2017-06-06 NOTE — Patient Instructions (Addendum)
Quick Contraction: Gravity Eliminated (Hook-Lying)    Lie with hips and knees bent. Quickly squeeze then fully relax pelvic floor. Perform _1__ sets of _5__. Rest for __1_ seconds between sets. Do _3__ times a day.   Copyright  VHI. All rights reserved.   Slow Contraction: Gravity Eliminated (Hook-Lying)    Lie with hips and knees bent. Slowly squeeze pelvic floor for _5__ seconds. Rest for __5_ seconds. Repeat _5__ times. Do _3__ times a day.   Copyright  VHI. All rights reserved.  Moisturizers . They are used in the vagina to hydrate the mucous membrane that make up the vaginal canal. . Designed to keep a more normal acid balance (ph) . Once placed in the vagina, it will last between two to three days.  . Use 2-3 times per week at bedtime and last longer than 60 min. . Ingredients to avoid is glycerin and fragrance, can increase chance of infection . Should not be used just before sex due to causing irritation . Most are gels administered either in a tampon-shaped applicator or as a vaginal suppository. They are non-hormonal.   Types of Moisturizers . Samul Dada- drug store . Vitamin E vaginal suppositories- Whole foods, Amazon . Moist Again . Coconut oil- can break down condoms . Michail Jewels . Yes moisturizer- amazon . NeuEve Silk , NeuEve Silver for menopausal or over 65 (if have severe vaginal atrophy or cancer treatments use NeuEve Silk for  1 month than move to The Pepsi)- [employer], MapleFlower.dk . Olive and Bee intimate cream- www.oliveandbee.com.au  Creams to use externally on the Vulva area  Albertson's (good for for cancer patients that had radiation to the area)- [place] or [employer] Corporation.FlyingBasics.com.br  V-magic cream - amazon  Julva-amazon  Vital "V [place] salve ( help moisturize and help with thinning vulvar area, does have [place]   Things to avoid in the vaginal area . Do  not use things to irritate the vulvar area . No lotions just specialized creams for the vulva area- Neogyn, V-magic, No soaps; can use Aveeno or Calendula cleanser if needed. Must be gentle . No deodorants . No douches . Good to sleep without underwear to let the vaginal area to air out . No scrubbing: spread the lips to let warm water rinse over labias and pat dry    [facility] [address] [place], [place] [address] Phone # [phone] Fax [phone]

## 2017-06-10 ENCOUNTER — Telehealth: Payer: Self-pay | Admitting: Internal Medicine

## 2017-06-10 ENCOUNTER — Encounter: Payer: Self-pay | Admitting: Internal Medicine

## 2017-06-10 DIAGNOSIS — G4733 Obstructive sleep apnea (adult) (pediatric): Secondary | ICD-10-CM

## 2017-06-10 NOTE — Telephone Encounter (Signed)
Patient was denied by [facility] the home sleep study. Patient would need the split night study. Are you okay with [place] ordering that?  CY Please advise.

## 2017-06-10 NOTE — Telephone Encounter (Signed)
Spoke to pt her bcbs denied a Home sleep study she will need a in lab sleeo study if dr young is ok with this I need an order for a split night study and we need to schedule it after 06/27/17 Kristen [name]

## 2017-06-11 NOTE — Telephone Encounter (Signed)
Ok to change her order to sleep center  NPSG split night protocol    Dx OSA

## 2017-06-14 NOTE — Telephone Encounter (Signed)
Sleep study has been scheduled

## 2017-06-14 NOTE — Telephone Encounter (Signed)
Order placed per CY.   Libby please advise if anything else is needed. Thank you!

## 2017-06-15 ENCOUNTER — Ambulatory Visit: Payer: BC Managed Care – PPO | Admitting: Nutrition

## 2017-06-15 ENCOUNTER — Encounter: Payer: Self-pay | Admitting: Physical Therapy

## 2017-06-15 ENCOUNTER — Ambulatory Visit: Payer: BC Managed Care – PPO | Admitting: Physical Therapy

## 2017-06-15 DIAGNOSIS — M6281 Muscle weakness (generalized): Secondary | ICD-10-CM

## 2017-06-15 DIAGNOSIS — R279 Unspecified lack of coordination: Secondary | ICD-10-CM

## 2017-06-15 NOTE — Patient Instructions (Addendum)
Lubrication . Used for intercourse to reduce friction . Avoid ones that have glycerin, warming gels, tingling gels, icing or cooling gel, scented . Avoid parabens due to a preservative similar to female sex hormone . May need to be reapplied once or several times during sexual activity . Can be applied to both partners genitals prior to vaginal penetration to minimize friction or irritation . Prevent irritation and mucosal tears that cause post coital pain and increased the risk of vaginal and urinary tract infections . Oil-based lubricants cannot be used with condoms due to breaking them down.  Least likely to irritate vaginal tissue.  . Plant based-lubes are safe . Silicone-based lubrication are thicker and last long and used for post-menopausal women  Vaginal Lubricators Here is a list of some suggested lubricators you can use for intercourse. Use the most hypoallergenic product.  You can place on you or your partner.   Slippery Stuff  Sylk or Sliquid Natural H2O ( good  if frequent UTI's)  Blossom Organics (www.blossom-organics.com)  Luvena   Coconut oil  PJur Woman Nude- water based lubricant, amazon  Uberlube- Amazon  Aloe Vera  Yes lubricant- Campbell [employer] Platinum-Silicone, Target, Walgreens  Olive and Bee intimate cream-  www.oliveandbee.com.au Things to avoid in lubricants are glycerin, warming gels, tingling gels, icing or cooling  gels, and scented gels.  Also avoid Vaseline. KY jelly, Replens, and Astroglide kills good bacteria(lactobacilli)  Things to avoid in the vaginal area . Do not use things to irritate the vulvar area . No lotions- see below . No soaps; can use Aveeno or Calendula cleanser if needed. Must be gentle . No deodorants . No douches . Good to sleep without underwear to let the vaginal area to air out . No scrubbing: spread the lips to let warm water rinse over labias and pat dry  Creams that can be used on the [employer] Releveum or [place] Harvest Gele  Coconut oil  Toileting Techniques for Bowel Movements (Defecation) Using your belly (abdomen) and pelvic floor muscles to have a bowel movement is usually instinctive.  Sometimes people can have problems with these muscles and have to relearn proper defecation (emptying) techniques.  If you have weakness in your muscles, organs that are falling out, decreased sensation in your pelvis, or ignore your urge to go, you may find yourself straining to have a bowel movement.  You are straining if you are: . holding your breath or taking in a huge gulp of air and holding it  . keeping your lips and jaw tensed and closed tightly . turning red in the face because of excessive pushing or forcing . developing or worsening your  hemorrhoids . getting faint while pushing . not emptying completely and have to defecate many times a day  If you are straining, you are actually making it harder for yourself to have a bowel movement.  Many people find they are pulling up with the pelvic floor muscles and closing off instead of opening the anus. Due to lack pelvic floor relaxation and coordination the abdominal muscles, one has to work harder to push the feces out.  Many people have never been taught how to defecate efficiently and effectively.  Notice what happens to your body when you are having a bowel movement.  While you are sitting on the toilet pay attention to the following  areas: . Jaw and mouth position . Angle of your hips   . Whether your feet touch the ground or not . Arm placement  . Spine position . Waist . Belly tension . Anus (opening of the anal canal)  An Evacuation/Defecation Plan   Here are the 4 basic points:  1. Lean forward enough for your elbows to rest on your knees 2. Support your feet on the floor or use a low stool if your feet  don't touch the floor  3. Push out your belly as if you have swallowed a beach ball-you should feel a widening of your waist 4. Open and relax your pelvic floor muscles, rather than tightening around the anus      The following conditions my require modifications to your toileting posture:  . If you have had surgery in the past that limits your back, hip, pelvic, knee or ankle flexibility . Constipation   Your healthcare practitioner may make the following additional suggestions and adjustments:  1) Sit on the toilet  a) Make sure your feet are supported. b) Notice your hip angle and spine position-most people find it effective to lean forward or raise their knees, which can help the muscles around the anus to relax  c) When you lean forward, place your forearms on your thighs for support  2) Relax suggestions a) Breath deeply in through your nose and out slowly through your mouth as if you are smelling the flowers and blowing out the candles. b) To become aware of how to relax your muscles, contracting and releasing muscles can be helpful.  Pull your pelvic floor muscles in tightly by using the image of holding back gas, or closing around the anus (visualize making a circle smaller) and lifting the anus up and in.  Then release the muscles and your anus should drop down and feel open. Repeat 5 times ending with the feeling of relaxation. c) Keep your pelvic floor muscles relaxed; let your belly bulge out. d) The digestive tract starts at the mouth and ends at the anal opening, so be sure to relax both ends of the tube.  Place your tongue on the roof of your mouth with your teeth separated.  This helps relax your mouth and will help to relax the anus at the same time.  3) Empty (defecation) a) Keep your pelvic floor and sphincter relaxed, then bulge your anal muscles.  Make the anal opening wide.  b) Stick your belly out as if you have swallowed a beach ball. c) Make your belly wall hard  using your belly muscles while continuing to breathe. Doing this makes it easier to open your anus. d) Breath out and give a grunt (or try using other sounds such as ahhhh, shhhhh, ohhhh or grrrrrrr).  4) Finish a) As you finish your bowel movement, pull the pelvic floor muscles up and in.  This will leave your anus in the proper place rather than remaining pushed out and down. If you leave your anus pushed out and down, it will start to feel as though that is normal and give you incorrect signals about needing to have a bowel movement.    Earlie Counts, PT [facility] [address] [place], [place] [address]    Certain foods and liquids will decrease the pH making the urine more acidic.  Urinary urgency increases when the urine has a low pH.  Most common irritants: alcohol, carbonated beverages and caffinated beverages.  Foods  to avoid: apple juice, apples, ascorbic acid, canteloupes, chili, citrus fruits, coffee, cranberries, grapes, guava, peaches, pepper, pineapple, plums, strawberries, tea, tomatoes, and vinegar.  Drinking plenty of water may help to increase the pH and dilute out any of the effects of specific irritants.  Foods that are NOT irritating to the bladder include: Pears, papayas, sun-brewed teas, watermelons, non-citrus herbal teas, apricots, kava and low-acid instant drinks (Postum)  I want you to drink 8 cups that are 8OZ  Of water. (goal) Right now work on 1 bottle of water.    Slow Contraction: Gravity Resisted (Sitting)    Sitting, slowly squeeze pelvic floor for _5__ seconds. Rest for _5__ seconds. Repeat _10__ times. Do _3__ times a day.  Copyright  VHI. All rights reserved.    Quick Contraction: Gravity Resisted (Sitting)    Sitting, quickly squeeze then fully relax pelvic floor. Perform __1_ sets of _5__. Rest for _1__ seconds between sets. Do _3__ times a day.  Copyright  VHI. All rights reserved.   do not go to  the bathroom less than 2 hours.   [facility] [address] [place], [place] [address] Phone # [phone] Fax [phone]

## 2017-06-15 NOTE — Progress Notes (Signed)
Patient contacted me by telephone requesting additional tube feeding syringes and formula. States she can pick up today. Reports she is eating more. Continues to use 1-1/2 bottles of formula twice a day. Reports weight is stable. Provided education for patient to continue increasing oral intake and strive for weight maintenance. Patient will contact me as needed.

## 2017-06-15 NOTE — Therapy (Signed)
[facility] [address], [place], [place], [address] Phone: [phone]   Fax:  [phone]  Physical Therapy Treatment  Patient Details  Name: Kristen [name] MRN: [id number] Date of Birth: 07-Jun-1965 Referring Provider: Dr. Eppie Gibson   Encounter Date: 06/15/2017  PT End of Session - 06/15/17 1106    Visit Number  2    Date for PT Re-Evaluation  08/01/17    Authorization Type  BC[employer]    PT Start Time  1107 in the bathroom    PT Stop Time  1145    PT Time Calculation (min)  38 min    Activity Tolerance  Patient tolerated treatment well    Behavior During Therapy  [facility] for tasks assessed/performed       Past Medical History:  Diagnosis Date  . Allergy   . Anemia    Iron deficiency  . Anxiety   . Arthritis    back, left shoulder  . Cancer ([place])   . Chicken pox   . Depression   . Diarrhea    takes Imodium daily  . GERD (gastroesophageal reflux disease)   . H/O hiatal hernia   . Headache(784.0)   . History of kidney stones    1996ish  . History of radiation therapy 11/16/16- 01/01/17   Base of Tongue/ 66 gy in 33 fractions/ Dose: 2 Gy  . Hypertension   . Migraine    none for 5 years (as of 01/13/16)  . OSA (obstructive sleep apnea) 10/13/2015   unable to get cpap, plans to get one in 2018  . Pneumonia   . Restless legs   . Scoliosis   . Shortness of breath    with exertion    Past Surgical History:  Procedure Laterality Date  . BACK SURGERY  07/21/1978  . CHOLECYSTECTOMY N/A 08/15/2013   Procedure: LAPAROSCOPIC CHOLECYSTECTOMY WITH INTRAOPERATIVE CHOLANGIOGRAM;  Surgeon: Gwenyth Ober, MD;  Location: [place];  Service: General;  Laterality: N/A;  . COLONOSCOPY N/A 01/09/2013   Procedure: COLONOSCOPY;  Surgeon: Beryle Beams, MD;  Location: [place];  Service: Endoscopy;  Laterality: N/A;  . DIRECT LARYNGOSCOPY  07/2016   Dr. Nicolette Bang [facility]  . GASTROSTOMY TUBE PLACEMENT  09/27/2016  . HERNIA  REPAIR Left 1981  . IR PATIENT EVAL TECH 0-60 MINS  12/13/2016  . IR PATIENT EVAL TECH 0-60 MINS  04/11/2017  . MODIFIED RADICAL NECK DISSECTION Left 09/27/2016   Levels 1 & 2  . PARTIAL GLOSSECTOMY Left 09/27/2016   Left hemi partial glossectomy  . SPINE SURGERY  01/20/2016   fusion  . TONSILLECTOMY    . tracheotomy  09/27/2016  . TUBAL LIGATION  06/1988    There were no vitals filed for this visit.  Subjective Assessment - 06/15/17 1109    Subjective  I am having fewer orgasms. The urinary leakage is better.  I am going to the bathroom more. I am using the bathroom every 2-3 hours. I am having diarrhea all day today. I am sleepy today. I have been eating spinach the past week.  I use a feeding tube  My sugar drops after I use the feeding tube due to my sugar dropping .      Pertinent History  Diagnosis is adenoid cystic carcinoma.  Had left hemiglossectomy and resection of base of tongue 09/27/16 at [facility]; had trach placed and recently removed.  Plan is for adjuvant RT to surgical bed, 33 fractions.    Patient  Stated Goals  reduce urinary leakage    Currently in Pain?  Yes    Pain Score  3     Pain Location  Flank    Pain Orientation  Right    Pain Descriptors / Indicators  Pressure    Pain Type  Acute pain    Pain Onset  Today    Pain Frequency  Intermittent    Aggravating Factors   move the trunk to the right    Pain Relieving Factors  bend to the left    Multiple Pain Sites  No                       [facility] Adult PT Treatment/Exercise - 06/15/17 0001      Self-Care   Self-Care  Other Self-Care Comments    Other Self-Care Comments   bladder irritants; vaginal moisturizers, vaginal lubricators      Therapeutic Activites    Therapeutic Activities  Other Therapeutic Activities    Other Therapeutic Activities  correct toileting technique to not strain             PT Education - 06/15/17 1138    Education provided  Yes    Education Details  plevic floor  contraction; vaginal lubricants; toileting technique, bladder irritants    Person(s) Educated  Patient    Methods  Explanation;Demonstration;Verbal cues;Handout    Comprehension  Returned demonstration;Verbalized understanding       PT Short Term Goals - 06/15/17 1138      PT SHORT TERM GOAL #1   Title  pt will be independent with her initial HEP for pelvic floor contraction in hookly    Baseline  ---    Time  4    Period  Weeks    Status  Achieved      PT SHORT TERM GOAL #2   Title  understand what vaginal moisturizers are and how they improve vaginal health    Baseline  ----    Time  4    Period  Weeks    Status  Achieved      PT SHORT TERM GOAL #3   Title  understand how to toilet correctly to decrease straining of the pelvic floor     Period  Weeks    Status  On-going        PT Long Term Goals - 06/06/17 1209      PT LONG TERM GOAL #1   Title  Independent with HEP and understand how to progress herself for the pelvic floor    Time  8    Period  Weeks    Status  New    Target Date  08/01/17      PT LONG TERM GOAL #2   Title  pelvic floor strength >/= 3/5 and hold for 10 seconds to reduce urinary leakage >/= 75%    Time  8    Period  Weeks    Status  New    Target Date  08/01/17      PT LONG TERM GOAL #3   Title  ability to toilet without straining 50% of the time due to improved muscle coordination    Time  8    Period  Weeks    Status  New    Target Date  08/01/17      PT LONG TERM GOAL #4   Title  ability to get to the commode without leakage urine due to improve pelvic  floor coordination    Time  8    Period  Weeks    Status  New    Target Date  08/01/17      PT LONG TERM GOAL #5   Title  pain with penile penetration decreased >/= 50% due to improve tissue mobility and moisture of the vagina    Time  8    Period  Weeks    Status  New    Target Date  08/01/17         Head and Neck Clinic Goals - 11/30/16 1253      Patient will be able to  verbalize understanding of a home exercise program for cervical range of motion, posture, and walking.    Status  Achieved      Patient will be able to verbalize understanding of proper sitting and standing posture.    Status  Achieved      Patient will be able to verbalize understanding of lymphedema risk and availability of treatment for this condition.    Status  Achieved        Plan - 06/15/17 1114    Clinical Impression Statement  Patient reports she is having less urinary leakage and is going to the bathroom every 2 hours now.  Patient reports she is having diarrhea today and not sure.  Therapist told her it could be a virus.  Patient reports she has to eat after she uses her feeding tube due to her sugar dropping.  Patient understands how to use mositurizers and lubricants to reduce vaginal dryness.  Patient was educated on how to toilet correctly and drink more water to make it easier to have a bowel movement.  Patient will benefi tfrom skilled therapy to improve plevic floor strength, educate on vaginal health, reduce urinary leakage and pain during intercourse.      Rehab Potential  Excellent    Clinical Impairments Affecting Rehab Potential  left spinal accessory nerve injury; remote H/O back surgery; malignant neoplasm of base of tongue with radiation treatment    PT Frequency  1x / week    PT Duration  8 weeks    PT Treatment/Interventions  Biofeedback;Therapeutic activities;Therapeutic exercise;Balance training;Patient/family education;Neuromuscular re-education;Manual techniques;Energy conservation    PT Next Visit Plan   internal soft tissue work, hip stretches; pelvic floor contraction; urge to void    PT Home Exercise Plan  progress as needed    Recommended Other Services  MD signed the initial eval    Consulted and Agree with Plan of Care  Patient       Patient will benefit from skilled therapeutic intervention in order to improve the following deficits  and impairments:  Decreased strength, Pain, Increased fascial restricitons, Decreased activity tolerance, Decreased coordination  Visit Diagnosis: Muscle weakness (generalized)  Unspecified lack of coordination     Problem List Patient Active Problem List   Diagnosis Date Noted  . Acute encephalopathy 05/15/2017  . UTI (urinary tract infection) 05/15/2017  . Hypotension 02/10/2017  . 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 Malignant neoplasm of base of tongue ([place]) 10/29/2016  . 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[place]) 10/29/2016  . Headache associated with sexual activity 05/14/2016  . Tongue lesion 05/14/2016  . Loose stools 03/12/2016  . Hypocalcemia 01/22/2016  . Thrombocytopenia ([place]) 01/22/2016  . Chronic diarrhea 01/22/2016  . Chest pain   . Essential hypertension   . Orthostatic hypotension   . Spinal stenosis of lumbar region  01/20/2016    Class: Chronic  . DDD (degenerative disc disease), lumbar 01/20/2016    Class: Chronic  . Spinal stenosis of lumbar region with neurogenic claudication 01/20/2016  . Low back pain 12/22/2015  . Pre-op examination 12/22/2015  . OSA (obstructive sleep apnea) 10/13/2015  . Muscle cramp 08/01/2015  . GERD (gastroesophageal reflux disease) 07/09/2015  . Excessive daytime sleepiness 06/18/2015  . Shingles 08/28/2013  . Postop check 08/28/2013  . Cholelithiasis 07/10/2013  . Colitis 01/08/2013  . Postoperative anemia 01/08/2013  . Morbid obesity with BMI of 40.0-44.9, adult [facility]) 01/08/2013    Earlie Counts, PT 06/15/17 11:47 AM   [facility] [address] [place], [place], [address] Phone: [phone]   Fax:  [phone]  Name: Kristen [name] MRN: [id number] Date of Birth: October 22, 1965

## 2017-06-20 ENCOUNTER — Ambulatory Visit: Payer: BC Managed Care – PPO

## 2017-06-21 ENCOUNTER — Ambulatory Visit: Payer: BC Managed Care – PPO | Admitting: Physical Therapy

## 2017-06-21 ENCOUNTER — Telehealth: Payer: Self-pay | Admitting: Physical Therapy

## 2017-06-21 NOTE — Telephone Encounter (Signed)
Called pt as she did not come for 10:15 appointment.  She apologized as she thought appt was at 11:00.  Pt will call back to reschedule appointment when she has her calendar handy. Donato Heinz. Owens Shark, PT

## 2017-06-22 ENCOUNTER — Encounter: Payer: Self-pay | Admitting: Physical Therapy

## 2017-06-22 ENCOUNTER — Ambulatory Visit: Payer: BC Managed Care – PPO | Attending: Radiation Oncology | Admitting: Physical Therapy

## 2017-06-22 DIAGNOSIS — R279 Unspecified lack of coordination: Secondary | ICD-10-CM

## 2017-06-22 DIAGNOSIS — M6281 Muscle weakness (generalized): Secondary | ICD-10-CM | POA: Diagnosis present

## 2017-06-22 NOTE — Therapy (Signed)
[facility] [address], [place], [place], [address] Phone: [phone]   Fax:  [phone]  Physical Therapy Treatment  Patient Details  Name: Kristen [name] MRN: [id number] Date of Birth: 08-Jun-1965 Referring Provider: Dr. Eppie Gibson   Encounter Date: 06/22/2017  PT End of Session - 06/22/17 1112    Visit Number  3    Date for PT Re-Evaluation  08/01/17    Authorization Type  BCBS    PT Start Time  1100    PT Stop Time  1145    PT Time Calculation (min)  45 min    Activity Tolerance  Patient tolerated treatment well    Behavior During Therapy  [facility] for tasks assessed/performed       Past Medical History:  Diagnosis Date  . Allergy   . Anemia    Iron deficiency  . Anxiety   . Arthritis    back, left shoulder  . Cancer ([place])   . Chicken pox   . Depression   . Diarrhea    takes Imodium daily  . GERD (gastroesophageal reflux disease)   . H/O hiatal hernia   . Headache(784.0)   . History of kidney stones    1996ish  . History of radiation therapy 11/16/16- 01/01/17   Base of Tongue/ 66 gy in 33 fractions/ Dose: 2 Gy  . Hypertension   . Migraine    none for 5 years (as of 01/13/16)  . OSA (obstructive sleep apnea) 10/13/2015   unable to get cpap, plans to get one in 2018  . Pneumonia   . Restless legs   . Scoliosis   . Shortness of breath    with exertion    Past Surgical History:  Procedure Laterality Date  . BACK SURGERY  07/21/1978  . CHOLECYSTECTOMY N/A 08/15/2013   Procedure: LAPAROSCOPIC CHOLECYSTECTOMY WITH INTRAOPERATIVE CHOLANGIOGRAM;  Surgeon: Gwenyth Ober, MD;  Location: [place];  Service: General;  Laterality: N/A;  . COLONOSCOPY N/A 01/09/2013   Procedure: COLONOSCOPY;  Surgeon: Beryle Beams, MD;  Location: [place];  Service: Endoscopy;  Laterality: N/A;  . DIRECT LARYNGOSCOPY  07/2016   Dr. Nicolette Bang [facility]  . GASTROSTOMY TUBE PLACEMENT  09/27/2016  . HERNIA REPAIR Left 1981   . IR PATIENT EVAL TECH 0-60 MINS  12/13/2016  . IR PATIENT EVAL TECH 0-60 MINS  04/11/2017  . MODIFIED RADICAL NECK DISSECTION Left 09/27/2016   Levels 1 & 2  . PARTIAL GLOSSECTOMY Left 09/27/2016   Left hemi partial glossectomy  . SPINE SURGERY  01/20/2016   fusion  . TONSILLECTOMY    . tracheotomy  09/27/2016  . TUBAL LIGATION  06/1988    There were no vitals filed for this visit.  Subjective Assessment - 06/22/17 1101    Subjective  I had diarrhea due to eating spinach last week. The diarrhea is gone. I have my appetite now and eating 2 times per day.  I do my stomach food 2 times a day. I feeling stronger. Urinary leakage is 80% better. I can go without a pad now and have no leakage.  I have leakage when I am horny.  I go to the bathroom every 2 hours. Urge to void is 50% better.     Pertinent History  Diagnosis is adenoid cystic carcinoma.  Had left hemiglossectomy and resection of base of tongue 09/27/16 at [facility]; had trach placed and recently removed.  Plan is for adjuvant RT to surgical bed, 33  fractions.    Patient Stated Goals  reduce urinary leakage    Currently in Pain?  Yes    Pain Score  7     Pain Location  Flank    Pain Orientation  Right    Pain Descriptors / Indicators  Shooting    Pain Type  Acute pain    Pain Onset  1 to 4 weeks ago    Pain Frequency  Intermittent    Aggravating Factors   move the trunk to the right    Pain Relieving Factors  bend to the left    Multiple Pain Sites  No                    Pelvic Floor Special Questions - 06/22/17 0001    Pelvic Floor Internal Exam  Patient confirms identification and approves PT to assess muscle strength and treatment    Exam Type  Vaginal    Strength  weak squeeze, no lift very little lift compared to before    Strength # of seconds  5        [facility] Adult PT Treatment/Exercise - 06/22/17 0001      Neuro Re-ed    Neuro Re-ed Details   with pelvic floor contraction with index finger in the  vaginal canal to give tactile cues for contraction      Manual Therapy   Manual Therapy  Internal Pelvic Floor    Internal Pelvic Floor  bil. obturator internist, bil. levator ani             PT Education - 06/22/17 1152    Education provided  Yes    Education Details  toileting; urge to void, pelvic floor contraction with lower abdominal contraction    Person(s) Educated  Patient    Methods  Explanation;Demonstration;Verbal cues;Handout    Comprehension  Returned demonstration;Verbalized understanding       PT Short Term Goals - 06/22/17 1113      PT SHORT TERM GOAL #1   Title  pt will be independent with her initial HEP for pelvic floor contraction in hookly    Time  4    Period  Weeks    Status  Achieved      PT SHORT TERM GOAL #2   Title  understand what vaginal moisturizers are and how they improve vaginal health    Time  4    Period  Weeks    Status  Achieved      PT SHORT TERM GOAL #3   Title  understand how to toilet correctly to decrease straining of the pelvic floor     Time  4    Period  Weeks    Status  Achieved        PT Long Term Goals - 06/06/17 1209      PT LONG TERM GOAL #1   Title  Independent with HEP and understand how to progress herself for the pelvic floor    Time  8    Period  Weeks    Status  New    Target Date  08/01/17      PT LONG TERM GOAL #2   Title  pelvic floor strength >/= 3/5 and hold for 10 seconds to reduce urinary leakage >/= 75%    Time  8    Period  Weeks    Status  New    Target Date  08/01/17      PT LONG TERM GOAL #  3   Title  ability to toilet without straining 50% of the time due to improved muscle coordination    Time  8    Period  Weeks    Status  New    Target Date  08/01/17      PT LONG TERM GOAL #4   Title  ability to get to the commode without leakage urine due to improve pelvic floor coordination    Time  8    Period  Weeks    Status  New    Target Date  08/01/17      PT LONG TERM GOAL #5    Title  pain with penile penetration decreased >/= 50% due to improve tissue mobility and moisture of the vagina    Time  8    Period  Weeks    Status  New    Target Date  08/01/17         Head and Neck Clinic Goals - 11/30/16 1253      Patient will be able to verbalize understanding of a home exercise program for cervical range of motion, posture, and walking.    Status  Achieved      Patient will be able to verbalize understanding of proper sitting and standing posture.    Status  Achieved      Patient will be able to verbalize understanding of lymphedema risk and availability of treatment for this condition.    Status  Achieved        Plan - 06/22/17 1112    Clinical Impression Statement  Patient has urinary leakage decreased by 80%.  Patient feels her vaginal area has increased moisture.  Patient understands how to have a bowel movement correctly.  Patient is able to do lower abdominal strength correctly. Patient will benefit from skilled therapy to improve quality of pelvic floor contraction to reduce leakage.     Rehab Potential  Excellent    Clinical Impairments Affecting Rehab Potential  left spinal accessory nerve injury; remote H/O back surgery; malignant neoplasm of base of tongue with radiation treatment    PT Frequency  1x / week    PT Duration  8 weeks    PT Treatment/Interventions  Biofeedback;Therapeutic activities;Therapeutic exercise;Balance training;Patient/family education;Neuromuscular re-education;Manual techniques;Energy conservation    PT Next Visit Plan  advanced pelvic floor contraction and abdominal strength    PT Home Exercise Plan  progress as needed    Consulted and Agree with Plan of Care  Patient       Patient will benefit from skilled therapeutic intervention in order to improve the following deficits and impairments:  Decreased strength, Pain, Increased fascial restricitons, Decreased activity tolerance, Decreased  coordination  Visit Diagnosis: Muscle weakness (generalized)  Unspecified lack of coordination     Problem List Patient Active Problem List   Diagnosis Date Noted  . Acute encephalopathy 05/15/2017  . UTI (urinary tract infection) 05/15/2017  . Hypotension 02/10/2017  . 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 Malignant neoplasm of base of tongue ([place]) 10/29/2016  . 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 Headache associated with sexual activity 05/14/2016  . Tongue lesion 05/14/2016  . Loose stools 03/12/2016  . Hypocalcemia 01/22/2016  . Thrombocytopenia ([place]) 01/22/2016  . Chronic diarrhea 01/22/2016  . Chest pain   . Essential hypertension   . Orthostatic hypotension   . Spinal stenosis of lumbar region 01/20/2016    Class: Chronic  . DDD (degenerative  disc disease), lumbar 01/20/2016    Class: Chronic  . Spinal stenosis of lumbar region with neurogenic claudication 01/20/2016  . Low back pain 12/22/2015  . Pre-op examination 12/22/2015  . OSA (obstructive sleep apnea) 10/13/2015  . Muscle cramp 08/01/2015  . GERD (gastroesophageal reflux disease) 07/09/2015  . Excessive daytime sleepiness 06/18/2015  . Shingles 08/28/2013  . Postop check 08/28/2013  . Cholelithiasis 07/10/2013  . Colitis 01/08/2013  . Postoperative anemia 01/08/2013  . Morbid obesity with BMI of 40.0-44.9, adult [facility]) 01/08/2013    Earlie Counts, PT 06/22/17 12:05 PM   [facility] [address], [place], [place], [address] Phone: [phone]   Fax:  [phone]  Name: GARIMA CHRONIS MRN: [id number] Date of Birth: Dec 25, 1965

## 2017-06-22 NOTE — Patient Instructions (Addendum)
Relaxation Exercises with the Urge to Void   When you experience an urge to void:  FIRST  Stop and stand very still    Sit down if you can    Don't move    You need to stay very still to maintain control  SECOND Squeeze your pelvic floor muscles 5 times, like a quick flick, to keep from leaking  THIRD Relax  Take a deep breath and then let it out  Try to make the urge go away by using relaxation and visualization techniques  FINALLY When you feel the urge go away somewhat, walk normally to the bathroom.   If the urge gets suddenly stronger on the way, you may stop again and relax to regain control.   Toileting Techniques for Bowel Movements (Defecation) Using your belly (abdomen) and pelvic floor muscles to have a bowel movement is usually instinctive.  Sometimes people can have problems with these muscles and have to relearn proper defecation (emptying) techniques.  If you have weakness in your muscles, organs that are falling out, decreased sensation in your pelvis, or ignore your urge to go, you may find yourself straining to have a bowel movement.  You are straining if you are: . holding your breath or taking in a huge gulp of air and holding it  . keeping your lips and jaw tensed and closed tightly . turning red in the face because of excessive pushing or forcing . developing or worsening your  hemorrhoids . getting faint while pushing . not emptying completely and have to defecate many times a day  If you are straining, you are actually making it harder for yourself to have a bowel movement.  Many people find they are pulling up with the pelvic floor muscles and closing off instead of opening the anus. Due to lack pelvic floor relaxation and coordination the abdominal muscles, one has to work harder to push the feces out.  Many people have never been taught how to defecate efficiently and effectively.  Notice what happens to your body when you are having a bowel movement.  While  you are sitting on the toilet pay attention to the following areas: . Jaw and mouth position . Angle of your hips   . Whether your feet touch the ground or not . Arm placement  . Spine position . Waist . Belly tension . Anus (opening of the anal canal)  An Evacuation/Defecation Plan   Here are the 4 basic points:  1. Lean forward enough for your elbows to rest on your knees 2. Support your feet on the floor or use a low stool if your feet don't touch the floor  3. Push out your belly as if you have swallowed a beach ball--you should feel a widening of your waist 4. Open and relax your pelvic floor muscles, rather than tightening around the anus      The following conditions my require modifications to your toileting posture:  . If you have had surgery in the past that limits your back, hip, pelvic, knee or ankle flexibility . Constipation   Your healthcare practitioner may make the following additional suggestions and adjustments:  1) Sit on the toilet  a) Make sure your feet are supported. b) Notice your hip angle and spine position--most people find it effective to lean forward or raise their knees, which can help the muscles around the anus to relax  c) When you lean forward, place your forearms on your thighs for support  2) Relax suggestions a) Breath deeply in through your nose and out slowly through your mouth as if you are smelling the flowers and blowing out the candles. b) To become aware of how to relax your muscles, contracting and releasing muscles can be helpful.  Pull your pelvic floor muscles in tightly by using the image of holding back gas, or closing around the anus (visualize making a circle smaller) and lifting the anus up and in.  Then release the muscles and your anus should drop down and feel open. Repeat 5 times ending with the feeling of relaxation. c) Keep your pelvic floor muscles relaxed; let your belly bulge out. d) The digestive tract starts at the  mouth and ends at the anal opening, so be sure to relax both ends of the tube.  Place your tongue on the roof of your mouth with your teeth separated.  This helps relax your mouth and will help to relax the anus at the same time.  3) Empty (defecation) a) Keep your pelvic floor and sphincter relaxed, then bulge your anal muscles.  Make the anal opening wide.  b) Stick your belly out as if you have swallowed a beach ball. c) Make your belly wall hard using your belly muscles while continuing to breathe. Doing this makes it easier to open your anus. d) Breath out and give a grunt (or try using other sounds such as ahhhh, shhhhh, ohhhh or grrrrrrr).  4) Finish a) As you finish your bowel movement, pull the pelvic floor muscles up and in.  This will leave your anus in the proper place rather than remaining pushed out and down. If you leave your anus pushed out and down, it will start to feel as though that is normal and give you incorrect signals about needing to have a bowel movement. [email or web address]   Kristen [name], PT [facility] [address] [place], [place] [address] Abdominal Bracing With Pelvic Floor (Hook-Lying)    With neutral spine, tighten pelvic floor and abdominals. Hold 5 sec. Repeat __5_ times. Do _2__ times a day.   Copyright  VHI. All rights reserved.    Bracing With Knee Fallout (Hook-Lying)    With neutral spine, tighten pelvic floor and abdominals and hold. Alternating legs, drop knee out to side. Keep opposite hip still. Repeat __10_ times. Do __1_ times a day.   Copyright  VHI. All rights reserved.

## 2017-06-23 ENCOUNTER — Other Ambulatory Visit (INDEPENDENT_AMBULATORY_CARE_PROVIDER_SITE_OTHER): Payer: Self-pay | Admitting: Physician Assistant

## 2017-06-23 NOTE — Telephone Encounter (Signed)
JN PT

## 2017-06-23 NOTE — Telephone Encounter (Signed)
Tramadol refill request

## 2017-06-23 NOTE — Telephone Encounter (Signed)
This is a [facility] patient.  Will you send this to them?

## 2017-06-24 NOTE — Telephone Encounter (Signed)
Called into pharmacy

## 2017-06-27 ENCOUNTER — Ambulatory Visit: Payer: BC Managed Care – PPO | Admitting: Physical Therapy

## 2017-06-27 ENCOUNTER — Encounter: Payer: Self-pay | Admitting: Physical Therapy

## 2017-06-27 DIAGNOSIS — R279 Unspecified lack of coordination: Secondary | ICD-10-CM

## 2017-06-27 DIAGNOSIS — M6281 Muscle weakness (generalized): Secondary | ICD-10-CM | POA: Diagnosis not present

## 2017-06-27 NOTE — Therapy (Signed)
[facility] [address], [place], [place], [address] Phone: [phone]   Fax:  [phone]  Physical Therapy Treatment  Patient Details  Name: Kristen [name] MRN: [id number] Date of Birth: [birth year] Referring Provider: Dr. Eppie Gibson   Encounter Date: 06/27/2017  PT End of Session - 06/27/17 1140    Visit Number  4    Date for PT Re-Evaluation  08/01/17    Authorization Type  [employer]    PT Start Time  1100    PT Stop Time  1140    PT Time Calculation (min)  40 min    Activity Tolerance  Patient tolerated treatment well    Behavior During Therapy  [facility] for tasks assessed/performed       Past Medical History:  Diagnosis Date  . Allergy   . Anemia    Iron deficiency  . Anxiety   . Arthritis    back, left shoulder  . Cancer ([place])   . Chicken pox   . Depression   . Diarrhea    takes Imodium daily  . GERD (gastroesophageal reflux disease)   . H/O hiatal hernia   . Headache(784.0)   . History of kidney stones    1996ish  . History of radiation therapy 11/16/16- 01/01/17   Base of Tongue/ 66 gy in 33 fractions/ Dose: 2 Gy  . Hypertension   . Migraine    none for 5 years (as of 01/13/16)  . OSA (obstructive sleep apnea) 10/13/2015   unable to get cpap, plans to get one in 2018  . Pneumonia   . Restless legs   . Scoliosis   . Shortness of breath    with exertion    Past Surgical History:  Procedure Laterality Date  . BACK SURGERY  07/21/1978  . CHOLECYSTECTOMY N/A 08/15/2013   Procedure: LAPAROSCOPIC CHOLECYSTECTOMY WITH INTRAOPERATIVE CHOLANGIOGRAM;  Surgeon: Gwenyth Ober, MD;  Location: [facility];  Service: General;  Laterality: N/A;  . COLONOSCOPY N/A 01/09/2013   Procedure: COLONOSCOPY;  Surgeon: Beryle Beams, MD;  Location: [facility];  Service: Endoscopy;  Laterality: N/A;  . DIRECT LARYNGOSCOPY  07/2016   Dr. Nicolette [facility]  . GASTROSTOMY TUBE PLACEMENT  09/27/2016  . HERNIA REPAIR Left 1981   . IR PATIENT EVAL TECH 0-60 MINS  12/13/2016  . IR PATIENT EVAL TECH 0-60 MINS  04/11/2017  . MODIFIED RADICAL NECK DISSECTION Left 09/27/2016   Levels 1 & 2  . PARTIAL GLOSSECTOMY Left 09/27/2016   Left hemi partial glossectomy  . SPINE SURGERY  01/20/2016   fusion  . TONSILLECTOMY    . tracheotomy  09/27/2016  . TUBAL LIGATION  06/1988    There were no vitals filed for this visit.  Subjective Assessment - 06/27/17 1101    Subjective  I had diarrhea due to her food I put in my feeding tube so I stopped using it.      Pertinent History  Diagnosis is adenoid cystic carcinoma.  Had left hemiglossectomy and resection of base of tongue 09/27/16 at [facility]; had trach placed and recently removed.  Plan is for adjuvant RT to surgical bed, 33 fractions.    Patient Stated Goals  reduce urinary leakage    Currently in Pain?  Yes    Pain Score  2     Pain Location  Flank    Pain Orientation  Right    Pain Type  Acute pain    Pain Onset  1 to  4 weeks ago    Pain Frequency  Intermittent    Aggravating Factors   move the trunk to the right    Pain Relieving Factors  bend to the left    Multiple Pain Sites  No                       [facility] Adult PT Treatment/Exercise - 06/27/17 0001      Lumbar Exercises: Aerobic   Stationary Bike  6 min level 1      Lumbar Exercises: Machines for Strengthening   Leg Press  seat #8 incline #4 bil. 35# 3x10      Lumbar Exercises: Standing   Functional Squats  10 reps;1 second mini; holding onto something    Other Standing Lumbar Exercises  hip abduction, extension, flexion 2# on each leg 2x10; knee flexion 2# 10x      Lumbar Exercises: Supine   Clam  15 reps;1 second using yellow band    Clam Limitations  tactile cues to not posterior tilt pelvis    Bridge  15 reps;Limitations    Bridge Limitations  knees straight with lower leg on the bolster             PT Education - 06/27/17 1140    Education provided  Yes    Education Details   Access Code: [email or web address]     Person(s) Educated  Patient    Methods  Explanation;Demonstration;Verbal cues;Handout    Comprehension  Returned demonstration;Verbalized understanding       PT Short Term Goals - 06/22/17 1113      PT SHORT TERM GOAL #1   Title  pt will be independent with her initial HEP for pelvic floor contraction in hookly    Time  4    Period  Weeks    Status  Achieved      PT SHORT TERM GOAL #2   Title  understand what vaginal moisturizers are and how they improve vaginal health    Time  4    Period  Weeks    Status  Achieved      PT SHORT TERM GOAL #3   Title  understand how to toilet correctly to decrease straining of the pelvic floor     Time  4    Period  Weeks    Status  Achieved        PT Long Term Goals - 06/27/17 1140      PT LONG TERM GOAL #1   Title  Independent with HEP and understand how to progress herself for the pelvic floor    Baseline  ---    Time  8    Period  Weeks    Status  On-going      PT LONG TERM GOAL #2   Title  pelvic floor strength >/= 3/5 and hold for 10 seconds to reduce urinary leakage >/= 75%    Baseline  --    Time  8    Period  Weeks    Status  On-going      PT LONG TERM GOAL #3   Title  ability to toilet without straining 50% of the time due to improved muscle coordination    Time  8    Period  Weeks    Status  Achieved      PT LONG TERM GOAL #4   Title  ability to get to the commode without leakage urine due to improve pelvic floor  coordination    Time  8    Period  Weeks    Status  On-going      PT LONG TERM GOAL #5   Title  pain with penile penetration decreased >/= 50% due to improve tissue mobility and moisture of the vagina    Baseline  80% better    Time  8    Period  Weeks    Status  Achieved         Head [facility] Neck Clinic Goals - 11/30/16 1253      Patient will be able to verbalize understanding of a home exercise program for cervical range of motion, posture, and walking.     Status  Achieved      Patient will be able to verbalize understanding of proper sitting and standing posture.    Status  Achieved      Patient will be able to verbalize understanding of lymphedema risk and availability of treatment for this condition.    Status  Achieved        Plan - 06/27/17 1103    Clinical Impression Statement  Had trouble with urinary leakage this past weekend due to the diarrhea. I only strain alittle bit.  The leaning forward has helped her not to strain. Patient is not able to lift her buttocks with knees bent instead she has to have her legs straight on a bolster due to weakness.  Patient has changed the liquid she puts in her feeding tube due to diarrhea.  Patient is working on endurance and strength to reduce leakage and improve quality of life.     Rehab Potential  Excellent    Clinical Impairments Affecting Rehab Potential  left spinal accessory nerve injury; remote H/O back surgery; malignant neoplasm of base of tongue with radiation treatment    PT Frequency  1x / week    PT Duration  8 weeks    PT Treatment/Interventions  Biofeedback;Therapeutic activities;Therapeutic exercise;Balance training;Patient/family education;Neuromuscular re-education;Manual techniques;Energy conservation    PT Next Visit Plan  advanced pelvic floor contraction and abdominal strength; leg strength; recumbent bike    PT Home Exercise Plan  progress as needed    Recommended Other Services  Access Code: [email or web address]     Consulted and Agree with Plan of Care  Patient       Patient will benefit from skilled therapeutic intervention in order to improve the following deficits and impairments:  Decreased strength, Pain, Increased fascial restricitons, Decreased activity tolerance, Decreased coordination  Visit Diagnosis: Muscle weakness (generalized)  Unspecified lack of coordination     Problem List Patient Active Problem List   Diagnosis Date Noted  . Acute  encephalopathy 05/15/2017  . UTI (urinary tract infection) 05/15/2017  . Hypotension 02/10/2017  . 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 Malignant neoplasm of base of tongue ([place]) 10/29/2016  . 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 Headache associated with sexual activity 05/14/2016  . Tongue lesion 05/14/2016  . Loose stools 03/12/2016  . Hypocalcemia 01/22/2016  . Thrombocytopenia ([place]) 01/22/2016  . Chronic diarrhea 01/22/2016  . Chest pain   . Essential hypertension   . Orthostatic hypotension   . Spinal stenosis of lumbar region 01/20/2016    Class: Chronic  . DDD (degenerative disc disease), lumbar 01/20/2016    Class: Chronic  . Spinal stenosis of lumbar region with neurogenic claudication 01/20/2016  . Low back pain 12/22/2015  . Pre-op examination 12/22/2015  .  OSA (obstructive sleep apnea) 10/13/2015  . Muscle cramp 08/01/2015  . GERD (gastroesophageal reflux disease) 07/09/2015  . Excessive daytime sleepiness 06/18/2015  . Shingles 08/28/2013  . Postop check 08/28/2013  . Cholelithiasis 07/10/2013  . Colitis 01/08/2013  . Postoperative anemia 01/08/2013  . Morbid obesity with BMI of 40.0-44.9, adult [facility]) 01/08/2013    Earlie Counts, PT 06/27/17 11:42 AM    [facility] [address], [place], [place], [address] Phone: [phone]   Fax:  [phone]  Name: Kristen [name] MRN: [id number] Date of Birth: 01/27/1965

## 2017-06-27 NOTE — Patient Instructions (Signed)
Access Code: [email or web address]  URL: [email or web address]  Date: 06/27/2017  Prepared by: Earlie Counts   Exercises  Supine Gluteal Sets - 10 reps - 1 sets - 5 hold - 1x daily - 7x weekly  Hooklying Clamshell with Resistance - 10 reps - 2 sets - 1x daily - 7x weekly  Standing Hip Abduction - 10 reps - 2 sets - 1x daily - 7x weekly  Standing Hip Extension with Chair - 10 reps - 2 sets - 1x daily - 7x weekly  [facility] [address] [place], [place] [address] Phone # [phone] Fax [phone]

## 2017-06-28 ENCOUNTER — Ambulatory Visit: Payer: BC Managed Care – PPO | Attending: Radiation Oncology | Admitting: Rehabilitation

## 2017-06-28 ENCOUNTER — Encounter: Payer: Self-pay | Admitting: Rehabilitation

## 2017-06-28 DIAGNOSIS — M542 Cervicalgia: Secondary | ICD-10-CM

## 2017-06-28 DIAGNOSIS — Z483 Aftercare following surgery for neoplasm: Secondary | ICD-10-CM

## 2017-06-28 DIAGNOSIS — M25612 Stiffness of left shoulder, not elsewhere classified: Secondary | ICD-10-CM | POA: Diagnosis present

## 2017-06-28 DIAGNOSIS — R1312 Dysphagia, oropharyngeal phase: Secondary | ICD-10-CM | POA: Diagnosis present

## 2017-06-28 DIAGNOSIS — M6281 Muscle weakness (generalized): Secondary | ICD-10-CM | POA: Insufficient documentation

## 2017-06-28 DIAGNOSIS — M25611 Stiffness of right shoulder, not elsewhere classified: Secondary | ICD-10-CM | POA: Insufficient documentation

## 2017-06-28 DIAGNOSIS — I890 Lymphedema, not elsewhere classified: Secondary | ICD-10-CM | POA: Diagnosis present

## 2017-06-28 NOTE — Patient Instructions (Signed)
Wear neck chip pack 2-3 hours per evening Sleep on Rt side or wedge pillow

## 2017-06-28 NOTE — Therapy (Signed)
[place], [place], [address] Phone: [phone]   Fax:  [phone]  Physical Therapy ReEvaluation  Patient Details  Name: Kristen [name] MRN: [id number] Date of Birth: 1965-11-23 Referring Provider: Dr. Eppie Gibson   Encounter Date: 06/28/2017  PT End of Session - 06/28/17 1357    Visit Number  5    Date for PT Re-Evaluation  08/01/17    PT Start Time  1300    PT Stop Time  1345    PT Time Calculation (min)  45 min    Activity Tolerance  Patient tolerated treatment well    Behavior During Therapy  [place] for tasks assessed/performed       Past Medical History:  Diagnosis Date  . Allergy   . Anemia    Iron deficiency  . Anxiety   . Arthritis    back, left shoulder  . Cancer ([place])   . Chicken pox   . Depression   . Diarrhea    takes Imodium daily  . GERD (gastroesophageal reflux disease)   . H/O hiatal hernia   . Headache(784.0)   . History of kidney stones    1996ish  . History of radiation therapy 11/16/16- 01/01/17   Base of Tongue/ 66 gy in 33 fractions/ Dose: 2 Gy  . Hypertension   . Migraine    none for 5 years (as of 01/13/16)  . OSA (obstructive sleep apnea) 10/13/2015   unable to get cpap, plans to get one in 2018  . Pneumonia   . Restless legs   . Scoliosis   . Shortness of breath    with exertion    Past Surgical History:  Procedure Laterality Date  . BACK SURGERY  07/21/1978  . CHOLECYSTECTOMY N/A 08/15/2013   Procedure: LAPAROSCOPIC CHOLECYSTECTOMY WITH INTRAOPERATIVE CHOLANGIOGRAM;  Surgeon: Gwenyth Ober, MD;  Location: [facility];  Service: General;  Laterality: N/A;  . COLONOSCOPY N/A 01/09/2013   Procedure: COLONOSCOPY;  Surgeon: Beryle Beams, MD;  Location: [facility];  Service: Endoscopy;  Laterality: N/A;  . DIRECT LARYNGOSCOPY  07/2016   [facility]  . GASTROSTOMY TUBE PLACEMENT  09/27/2016  . HERNIA REPAIR Left 1981  . IR PATIENT EVAL TECH 0-60  MINS  12/13/2016  . IR PATIENT EVAL TECH 0-60 MINS  04/11/2017  . MODIFIED RADICAL NECK DISSECTION Left 09/27/2016   Levels 1 & 2  . PARTIAL GLOSSECTOMY Left 09/27/2016   Left hemi partial glossectomy  . SPINE SURGERY  01/20/2016   fusion  . TONSILLECTOMY    . tracheotomy  09/27/2016  . TUBAL LIGATION  06/1988    There were no vitals filed for this visit.   Subjective Assessment - 06/28/17 1306    Subjective  Right after surgery it was swollen for awhile and it went down.  After radiation it started getting swollen again and tight L>R under the chin.  The swelling comes and goes and is worse in the morning.  The trach location is always sore.  Seeing Cheryl for pelvic floor 1x per week,     Pertinent History  Diagnosis is adenoid cystic carcinoma.  Had left hemiglossectomy and resection of base of tongue with neck dissection 09/27/16 at [facility]; had trach placed and removed.  hx of lumbar fusion in 2018    Limitations  Lifting    Patient Stated Goals  improve UE strength, control the swelling in the neck and face    Currently in Pain?  Yes    Pain Score  2     Pain Location  -- trachectomy incision hurts all the time.  The tongue will start to hurt when it is dry.           [facility] PT Assessment - 06/28/17 0001      Assessment   Medical Diagnosis  adenoid cystic carcinoma tongue    Referring Provider  Dr. Eppie Gibson    Onset Date/Surgical Date  09/27/16    Hand Dominance  Right    Prior Therapy  currently having pelvic floor therapy      Precautions   Precaution Comments  cancer precautions      Restrictions   Weight Bearing Restrictions  No      Balance Screen   Has the patient fallen in the past 6 months  No    Has the patient had a decrease in activity level because of a fear of falling?   No    Is the patient reluctant to leave their home because of a fear of falling?   No      Home Film/video editor residence    Living Arrangements   Spouse/significant other    Type of [place]  One level      Prior Function   Level of Independence  Independent    Vocation  Full time employment    Catering manager on special needs bus    Leisure  no regular exercise      Cognition   Overall Cognitive Status  Within Functional Limits for tasks assessed      Observation/Other Assessments   Observations  well healed incision from radical neck dissection L side, mild edema Lt cheek, skin and scar tissue puckering around trach incision      Posture/Postural Control   Posture/Postural Control  Postural limitations    Postural Limitations  Forward head      ROM / Strength   AROM / PROM / Strength  Strength      AROM   AROM Assessment Site  Cervical;Shoulder    Right/Left Shoulder  Right;Left    Right Shoulder Flexion  130 Degrees    Right Shoulder ABduction  150 Degrees    Right Shoulder Internal Rotation  70 Degrees    Right Shoulder External Rotation  85 Degrees    Left Shoulder Flexion  130 Degrees    Left Shoulder ABduction  150 Degrees    Left Shoulder Internal Rotation  70 Degrees    Left Shoulder External Rotation  85 Degrees    Cervical Flexion  42 pulling    Cervical Extension  34    Cervical - Right Side Bend  25    Cervical - Left Side Bend  22 pulls l neck    Cervical - Right Rotation  50    Cervical - Left Rotation  50      Strength   Overall Strength Comments  30# L, 35# L     Strength Assessment Site  Shoulder;Elbow    Right/Left Shoulder  Right;Left    Right Shoulder Flexion  4-/5    Right Shoulder ABduction  4-/5    Right Shoulder Internal Rotation  5/5    Right Shoulder External Rotation  4-/5    Left Shoulder Flexion  4-/5    Left Shoulder ABduction  4-/5    Left Shoulder Internal Rotation  5/5  Left Shoulder External Rotation  4-/5    Right/Left Elbow  Right;Left    Right Elbow Flexion  4/5    Right Elbow Extension  4/5    Left Elbow Flexion  4/5     Left Elbow Extension  4/5      Palpation   Palpation comment  2-3 ttp Lt UT with trigger points        LYMPHEDEMA/ONCOLOGY QUESTIONNAIRE - 06/28/17 1317      Type   Cancer Type  adenoid cystic carcinoma      Surgeries   Other Surgery Date  09/27/16 hemiglossectomy, BOT resection, partial neck dissection      Date Lymphedema/Swelling Started   Date  -- after radiation      Treatment   Active Chemotherapy Treatment  No    Past Chemotherapy Treatment  No    Active Radiation Treatment  No    Body Site  sugical bed    Past Radiation Treatment  Yes    Date  01/01/17      What other symptoms do you have   Are you Having Heaviness or Tightness  No    Are you having Pain  No    Are you having pitting edema  No    Do you have infections  No      Head and Neck   Right Corner of mouth to where ear lobe meets face  10 cm    Left Corner of mouth to where ear lobe meets face  11.5 cm    4 cm superior to sternal notch around neck  40 cm    6 cm superior to sternal notch around neck  38 cm    8 cm superior to sternal notch around neck  37 cm             Outpatient Re[facility]28/[facility]  Lymphedema Life Impact Scale Total Score  38.24 %      Objective measurements completed on examination: See above findings.              PT Education - 06/28/17 1350    Education provided  Yes    Education Details  diagnosis, POC, chip pack use    Person(s) Educated  Patient    Methods  Explanation;Demonstration;Tactile cues    Comprehension  Verbalized understanding       PT Short Term Goals - 06/22/17 1113      PT SHORT TERM GOAL #1   Title  pt will be independent with her initial HEP for pelvic floor contraction in hookly    Time  4    Period  Weeks    Status  Achieved      PT SHORT TERM GOAL #2   Title  understand what vaginal moisturizers are and how they improve vaginal health    Time  4    Period  Weeks     Status  Achieved      PT SHORT TERM GOAL #3   Title  understand how to toilet correctly to decrease straining of the pelvic floor     Time  4    Period  Weeks    Status  Achieved        PT Long Term Goals - 06/28/17 1405      PT LONG TERM GOAL #1   Title  Pt will be independent with self MLD and self compression for head and neck lymphedema  Time  4    Period  Weeks    Status  New    Target Date  08/01/17      Additional Long Term Goals   Additional Long Term Goals  Yes      PT LONG TERM GOAL #6   Title  Pt will be independent with self MLD and self compression for head and neck lymphedema    Time  4    Period  Weeks    Status  New    Target Date  08/01/17      PT LONG TERM GOAL #7   Title  Pt will report decrease in subjective feelings of Lt neck stiffness in the morning by 50% or greater    Time  4    Period  Weeks    Status  New    Target Date  08/01/17      PT LONG TERM GOAL #8   Title  Pt will improve cervical AROM to no pulling experienced     Time  4    Period  Weeks    Status  New    Target Date  08/01/17      PT LONG TERM GOAL  #9   TITLE  Pt will be independent with HEP for shoulder and grip strength     Time  4    Period  Weeks    Status  New    Target Date  08/01/17         Head and Neck Clinic Goals - 11/30/16 1253      Patient will be able to verbalize understanding of a home exercise program for cervical range of motion, posture, and walking.    Status  Achieved      Patient will be able to verbalize understanding of proper sitting and standing posture.    Status  Achieved      Patient will be able to verbalize understanding of lymphedema risk and availability of treatment for this condition.    Status  Achieved         Plan - 06/28/17 1358    Clinical Impression Statement  Ms. Mckeel presents after hemiglossectomy, BOT resection, partial neck dissection in September of 2018 and with completion of radiation with  complaints of Lt neck swelling and tightness that comes and goes and is worse in the morning.  Her objective findings today include decreased tissue mobility and stiffness submentally and intothe Lt neck and incision, limited cervical AROM, limited shoulder AROM bilaterally, shoulder weakness bilaterally, and decreased grip strength.     History and Personal Factors relevant to plan of care:  hemiglossectomy, BOT resection, partial neck dissection, lumbar fusion, completed radiation, feeding tube    Clinical Presentation  Evolving    Clinical Presentation due to:  off and on swelling    Clinical Decision Making  Moderate    Rehab Potential  Excellent    Clinical Impairments Affecting Rehab Potential  left spinal accessory nerve injury; remote H/O back surgery; malignant neoplasm of base of tongue with radiation treatment    PT Frequency  2x / week    PT Duration  4 weeks    PT Treatment/Interventions  Therapeutic activities;Therapeutic exercise;Patient/family education;Manual techniques;Energy conservation;Manual lymph drainage;Taping    PT Next Visit Plan  chip pack ok?, help measure for chosen off the shelf facial garment for pt to order, start head and neck MLD, start self MLD (eventually include STM Lt neck and UT, cervical ROM and shoulder  strength/grip)       Patient will benefit from skilled therapeutic intervention in order to improve the following deficits and impairments:  Decreased strength, Pain, Increased fascial restricitons, Decreased activity tolerance, Decreased coordination  Visit Diagnosis: Aftercare following surgery for neoplasm - Plan: PT plan of care cert/re-cert  Lymphedema, not elsewhere classified - Plan: PT plan of care cert/re-cert  Cervicalgia - Plan: PT plan of care cert/re-cert  Stiffness of left shoulder, not elsewhere classified - Plan: PT plan of care cert/re-cert  Stiffness of right shoulder, not elsewhere classified - Plan: PT plan of care  cert/re-cert     Problem List Patient Active Problem List   Diagnosis Date Noted  . Acute encephalopathy 05/15/2017  . UTI (urinary tract infection) 05/15/2017  . Hypotension 02/10/2017  . 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 Malignant neoplasm of base of tongue ([place]) 10/29/2016  . 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[place]) 10/29/2016  . Headache associated with sexual activity 05/14/2016  . Tongue lesion 05/14/2016  . Loose stools 03/12/2016  . Hypocalcemia 01/22/2016  . Thrombocytopenia ([place]) 01/22/2016  . Chronic diarrhea 01/22/2016  . Chest pain   . Essential hypertension   . Orthostatic hypotension   . Spinal stenosis of lumbar region 01/20/2016    Class: Chronic  . DDD (degenerative disc disease), lumbar 01/20/2016    Class: Chronic  . Spinal stenosis of lumbar region with neurogenic claudication 01/20/2016  . Low back pain 12/22/2015  . Pre-op examination 12/22/2015  . OSA (obstructive sleep apnea) 10/13/2015  . Muscle cramp 08/01/2015  . GERD (gastroesophageal reflux disease) 07/09/2015  . Excessive daytime sleepiness 06/18/2015  . Shingles 08/28/2013  . Postop check 08/28/2013  . Cholelithiasis 07/10/2013  . Colitis 01/08/2013  . Postoperative anemia 01/08/2013  . Morbid obesity with BMI of 40.0-44.9, adult [facility]) 01/08/2013    Shan Levans, PT 06/28/2017, 2:11 PM  [place], [place], [address] Phone: [phone]   Fax:  [phone]  Name: Kristen [name] MRN: [id number] Date of Birth: 1965/04/26

## 2017-07-04 ENCOUNTER — Ambulatory Visit: Payer: BC Managed Care – PPO

## 2017-07-04 ENCOUNTER — Encounter: Payer: Self-pay | Admitting: Nutrition

## 2017-07-04 DIAGNOSIS — Z483 Aftercare following surgery for neoplasm: Secondary | ICD-10-CM | POA: Diagnosis not present

## 2017-07-04 DIAGNOSIS — R1312 Dysphagia, oropharyngeal phase: Secondary | ICD-10-CM

## 2017-07-04 NOTE — Therapy (Signed)
[facility] [address] [place], [place], [address] Phone: [phone]   Fax:  [phone]  Speech Language Pathology Treatment/RENEWAL Patient Details  Name: Kristen [name] MRN: [id number] Date of Birth: 1965/09/24 Referring Provider: Eppie Gibson, MD   Encounter Date: 07/04/2017  End of Session - 07/04/17 1719    Visit Number  6    Number of Visits  7    Date for SLP Re-Evaluation  08/17/17    SLP Start Time  0935    SLP Stop Time   1015    SLP Time Calculation (min)  40 min    Activity Tolerance  Patient tolerated treatment well       Past Medical History:  Diagnosis Date  . Allergy   . Anemia    Iron deficiency  . Anxiety   . Arthritis    back, left shoulder  . Cancer ([place])   . Chicken pox   . Depression   . Diarrhea    takes Imodium daily  . GERD (gastroesophageal reflux disease)   . H/O hiatal hernia   . Headache(784.0)   . History of kidney stones    1996ish  . History of radiation therapy 11/16/16- 01/01/17   Base of Tongue/ 66 gy in 33 fractions/ Dose: 2 Gy  . Hypertension   . Migraine    none for 5 years (as of 01/13/16)  . OSA (obstructive sleep apnea) 10/13/2015   unable to get cpap, plans to get one in 2018  . Pneumonia   . Restless legs   . Scoliosis   . Shortness of breath    with exertion    Past Surgical History:  Procedure Laterality Date  . BACK SURGERY  07/21/1978  . CHOLECYSTECTOMY N/A 08/15/2013   Procedure: LAPAROSCOPIC CHOLECYSTECTOMY WITH INTRAOPERATIVE CHOLANGIOGRAM;  Surgeon: Gwenyth Ober, MD;  Location: [facility];  Service: General;  Laterality: N/A;  . COLONOSCOPY N/A 01/09/2013   Procedure: COLONOSCOPY;  Surgeon: Beryle Beams, MD;  Location: [facility];  Service: Endoscopy;  Laterality: N/A;  . DIRECT LARYNGOSCOPY  07/2016   Dr. Nicolette [facility]  . GASTROSTOMY TUBE PLACEMENT  09/27/2016  . HERNIA REPAIR Left 1981  . IR PATIENT EVAL TECH 0-60 MINS  12/13/2016  .  IR PATIENT EVAL TECH 0-60 MINS  04/11/2017  . MODIFIED RADICAL NECK DISSECTION Left 09/27/2016   Levels 1 & 2  . PARTIAL GLOSSECTOMY Left 09/27/2016   Left hemi partial glossectomy  . SPINE SURGERY  01/20/2016   fusion  . TONSILLECTOMY    . tracheotomy  09/27/2016  . TUBAL LIGATION  06/1988    There were no vitals filed for this visit.  Subjective Assessment - 07/04/17 0924    Subjective  "It has gotten better." Chicken (cold) feels like "it gets trapped." Pt without dentures - appt to consider this is this week.    Currently in Pain?  No/denies            ADULT SLP TREATMENT - 07/04/17 0931      General Information   Behavior/Cognition  Cooperative;Pleasant mood;Alert      Treatment Provided   Treatment provided  Dysphagia      Dysphagia Treatment   Temperature Spikes Noted  Yes    Respiratory Status  Room air    Oral Cavity - Dentition  Edentulous    Treatment Methods  Skilled observation;Therapeutic exercise;Compensation strategy training    Patient observed directly with PO's  Yes  Type of PO's observed  Dysphagia 3 (soft);Thin liquids    Oral Phase Signs & Symptoms  Prolonged mastication    Pharyngeal Phase Signs & Symptoms  -- nothing noted today    Other treatment/comments  "I still have to have a lot of water when I eat." Mac and cheese, creamed potatoes. Pt req'd SBA on HEP; completes at least once a day - SLP encouraged pt to do BID. Pt is having something to eat x3/day.  Right now pt is 60-70% POs, 30-40% PEG. SLP encouraged her to incr POs. Pt adhered to precautions from MBSS. She completes HEP with rare min A.      Assessment / Recommendations / Plan   Plan  Continue with current plan of care      Dysphagia Recommendations   Diet recommendations  Dysphagia 3 (mechanical soft);Thin liquid    Liquids provided via  Cup    Medication Administration  Via alternative means    Supervision  Patient able to self feed    Compensations  Slow rate;Small  sips/bites;Multiple dry swallows after each bite/sip;Follow solids with liquid    Postural Changes and/or Swallow Maneuvers  -- lingual sweep (PRN)      Progression Toward Goals   Progression toward goals  Progressing toward goals         SLP Short Term Goals - 05/23/17 1400      SLP SHORT TERM GOAL #1   Title  pt will complete HEP with rare min A over two sessions    Status  Partially Met one visist      SLP SHORT TERM GOAL #2   Title  pt will tell SLP why she is completing HEP     Status  Achieved      SLP SHORT TERM GOAL #3   Title  pt will tell why a food journal can be helpful in returning to pre-radiation diet    Status  Deferred      SLP SHORT TERM GOAL #4   Title  pt will provide SLP 3 overt s/s aspiration PNA with modified independence    Status  Achieved       SLP Long Term Goals - 07/04/17 0954      SLP LONG TERM GOAL #1   Title  pt will complete HEP with modified independence over three sessions     Period  -- visits    Status  Achieved      SLP LONG TERM GOAL #2   Title  pt will tell SLP when HEP frequency can be reduced to x2-3/week    Status  Achieved      SLP LONG TERM GOAL #3   Title  pt will tell SLP 3 overt s/s of aspiration PNA with modified independence over three sessions    Status  Achieved      SLP LONG TERM GOAL #4   Title  pt will tell how food journal can assist return to more normal POs    Status  Achieved      SLP LONG TERM GOAL #5   Title  pt will perform HEP for swallowing with SBA from SLP after approx 8 weeks without ST    Time  1    Period  -- visit    Status  New       Plan - 07/04/17 0630    Clinical Impression Statement  Pt with improving oral and pharyngeal stages reported. She reports 60-70% PO, 30-40% tube feeding. Pt  reports she has been completing HEP once a day so SLP told her to incr frequency to twice a day- pt tells SLP she is confident she can make this happen. The probability of swallowing difficulty remains  high with the initiation of radiation therapy. Pt does not report any overt s/s aspiration PNA to date. Pt will need to cont to be followed by SLP for regular assessment of accurate HEP completion as well as for safety with POs both during and following treatment/s however at this time due to progress pt will be seen once approx every 8 weeks, likely one more session.    Speech Therapy Frequency  -- approx once every 8 weeks    Duration  --7 total sessions   Treatment/Interventions  Aspiration precaution training;Pharyngeal strengthening exercises;Diet toleration management by SLP;Trials of upgraded texture/liquids;Internal/external aids;Patient/family education;Compensatory strategies;SLP instruction and feedback;Cueing hierarchy;Environmental controls    Potential to Achieve Goals  Good       Patient will benefit from skilled therapeutic intervention in order to improve the following deficits and impairments:   Dysphagia, oropharyngeal    Problem List Patient Active Problem List   Diagnosis Date Noted  . Acute encephalopathy 05/15/2017  . UTI (urinary tract infection) 05/15/2017  . Hypotension 02/10/2017  . 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 Malignant neoplasm of base of tongue ([place]) 10/29/2016  . 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[place]) 10/29/2016  . Headache associated with sexual activity 05/14/2016  . Tongue lesion 05/14/2016  . Loose stools 03/12/2016  . Hypocalcemia 01/22/2016  . Thrombocytopenia ([place]) 01/22/2016  . Chronic diarrhea 01/22/2016  . Chest pain   . Essential hypertension   . Orthostatic hypotension   . Spinal stenosis of lumbar region 01/20/2016    Class: Chronic  . DDD (degenerative disc disease), lumbar 01/20/2016    Class: Chronic  . Spinal stenosis of lumbar region with neurogenic claudication 01/20/2016  . Low back pain 12/22/2015  . Pre-op examination 12/22/2015  . OSA (obstructive sleep apnea) 10/13/2015  . Muscle cramp  08/01/2015  . GERD (gastroesophageal reflux disease) 07/09/2015  . Excessive daytime sleepiness 06/18/2015  . Shingles 08/28/2013  . Postop check 08/28/2013  . Cholelithiasis 07/10/2013  . Colitis 01/08/2013  . Postoperative anemia 01/08/2013  . Morbid obesity with BMI of 40.0-44.9, adult ([place]) 01/08/2013    [facility] ,[place], [place]  07/04/2017, 5:23 PM  [place] [address] [place], [place], [address] Phone: [phone]   Fax:  [phone]   Name: LAQUESHA HOLCOMB MRN: [id number] Date of Birth: Sep 21, 1965

## 2017-07-04 NOTE — Progress Notes (Signed)
Brief nutrition follow-up completed with patient. She reports she is eating greater than 50% of her oral intake. Reports weight is stable and states her weight is 187 pounds. She is using Osmolite 1.5 through her feeding tube. Reports she is cold all the time and thinks her lower weight is responsible. Provided patient with samples of Osmolite 1.5. Encourage patient to increase oral intake by mouth. We will continue to follow as needed.  **Disclaimer: This note was dictated with voice recognition software. Similar sounding words can inadvertently be transcribed and this note may contain transcription errors which may not have been corrected upon publication of note.**

## 2017-07-05 ENCOUNTER — Ambulatory Visit: Payer: BC Managed Care – PPO | Admitting: Rehabilitation

## 2017-07-05 ENCOUNTER — Encounter: Payer: Self-pay | Admitting: Rehabilitation

## 2017-07-05 DIAGNOSIS — Z483 Aftercare following surgery for neoplasm: Secondary | ICD-10-CM | POA: Diagnosis not present

## 2017-07-05 DIAGNOSIS — M542 Cervicalgia: Secondary | ICD-10-CM

## 2017-07-05 DIAGNOSIS — M25612 Stiffness of left shoulder, not elsewhere classified: Secondary | ICD-10-CM

## 2017-07-05 DIAGNOSIS — M25611 Stiffness of right shoulder, not elsewhere classified: Secondary | ICD-10-CM

## 2017-07-05 DIAGNOSIS — I890 Lymphedema, not elsewhere classified: Secondary | ICD-10-CM

## 2017-07-05 DIAGNOSIS — M6281 Muscle weakness (generalized): Secondary | ICD-10-CM

## 2017-07-05 NOTE — Patient Instructions (Signed)
Access Code: [email or web address]  URL: [email or web address]  Date: 07/05/2017  Prepared by: Shan Levans   Exercises  Seated Cervical Sidebending Stretch - 2 reps - 1 sets - 30 hold - 2x daily - 7x weekly  Standing Shoulder Row with Anchored Resistance - 10 reps - 3 sets - 1x daily - 7x weekly  Seated Cervical Retraction and Rotation - 2 reps - 1 sets - 30 hold - 2x daily - 7x weekly  Shoulder Flexion Wall Walk - 4 reps - 1 sets - 1x daily - 7x weekly

## 2017-07-05 NOTE — Therapy (Signed)
[place], [place], [address] Phone: [phone]   Fax:  [phone]  Physical Therapy Treatment  Patient Details  Name: Kristen [name] MRN: [id number] Date of Birth: 05/22/1965 Referring Provider: Dr. Eppie Gibson   Encounter Date: 07/05/2017  PT End of Session - 07/05/17 0847    Visit Number  6 2 for lymphedema    Date for PT Re-Evaluation  08/01/17    PT Start Time  0800    PT Stop Time  0844    PT Time Calculation (min)  44 min    Activity Tolerance  Patient tolerated treatment well    Behavior During Therapy  [facility] for tasks assessed/performed       Past Medical History:  Diagnosis Date  . Allergy   . Anemia    Iron deficiency  . Anxiety   . Arthritis    back, left shoulder  . Cancer ([place])   . Chicken pox   . Depression   . Diarrhea    takes Imodium daily  . GERD (gastroesophageal reflux disease)   . H/O hiatal hernia   . Headache(784.0)   . History of kidney stones    1996ish  . History of radiation therapy 11/16/16- 01/01/17   Base of Tongue/ 66 gy in 33 fractions/ Dose: 2 Gy  . Hypertension   . Migraine    none for 5 years (as of 01/13/16)  . OSA (obstructive sleep apnea) 10/13/2015   unable to get cpap, plans to get one in 2018  . Pneumonia   . Restless legs   . Scoliosis   . Shortness of breath    with exertion    Past Surgical History:  Procedure Laterality Date  . BACK SURGERY  07/21/1978  . CHOLECYSTECTOMY N/A 08/15/2013   Procedure: LAPAROSCOPIC CHOLECYSTECTOMY WITH INTRAOPERATIVE CHOLANGIOGRAM;  Surgeon: Gwenyth Ober, MD;  Location: [place];  Service: General;  Laterality: N/A;  . COLONOSCOPY N/A 01/09/2013   Procedure: COLONOSCOPY;  Surgeon: Beryle Beams, MD;  Location: [place];  Service: Endoscopy;  Laterality: N/A;  . DIRECT LARYNGOSCOPY  07/2016   Dr. Nicolette Bang [facility]  . GASTROSTOMY TUBE PLACEMENT  09/27/2016  . HERNIA REPAIR Left 1981  . IR PATIENT EVAL  TECH 0-60 MINS  12/13/2016  . IR PATIENT EVAL TECH 0-60 MINS  04/11/2017  . MODIFIED RADICAL NECK DISSECTION Left 09/27/2016   Levels 1 & 2  . PARTIAL GLOSSECTOMY Left 09/27/2016   Left hemi partial glossectomy  . SPINE SURGERY  01/20/2016   fusion  . TONSILLECTOMY    . tracheotomy  09/27/2016  . TUBAL LIGATION  06/1988    There were no vitals filed for this visit.  Subjective Assessment - 07/05/17 0759    Subjective  Nothing new to report today.  Speech has been decreased to every 70month due to improvements in status . The chip pack is working well but it seems like it is getting looser.      Pertinent History  Diagnosis is adenoid cystic carcinoma.  Had left hem[facility]los[facility] base [facility].  hx of lumbar fusion in 2018    Patient Stated Goals  improve UE strength, control the swelling in the neck and face    Currently in Pain?  No/denies                  Outpatient Rehab from 06/28/2017 [facility]  Lymphedema Life Impact Scale Total Score  38.24 %           [facility] Adult PT Treatment/Exercise - 07/05/17 0001      Exercises   Exercises  Shoulder      Shoulder Exercises: Standing   Row  20 reps;Both;Theraband    Theraband Level (Shoulder Row)  Level 2 (Red)      Shoulder Exercises: Pulleys   Other Pulley Exercises  flexion and abduction x 1mn each with PT instruction      Shoulder Exercises: Therapy Ball   Flexion  Both;10 reps    Flexion Limitations  with lift off at the top bilateral       Manual Therapy   Manual Therapy  Manual Lymphatic Drainage (MLD);Edema management    Edema Management  pt to order marena compression garment from [employer]  Pt between small and medium so will return for medium if needed.  Should order Friday due to paycheck      Manual Lymphatic Drainage (MLD)  in supine HOB short neck, 5 deep breaths,  bilateral axiilary nodes, neck, submental focus and return of pathways                 PT Short Term Goals - 06/22/17 1113      PT SHORT TERM GOAL #1   Title  pt will be independent with her initial HEP for pelvic floor contraction in hookly    Time  4    Period  Weeks    Status  Achieved      PT SHORT TERM GOAL #2   Title  understand what vaginal moisturizers are and how they improve vaginal health    Time  4    Period  Weeks    Status  Achieved      PT SHORT TERM GOAL #3   Title  understand how to toilet correctly to decrease straining of the pelvic floor     Time  4    Period  Weeks    Status  Achieved        PT Long Term Goals - 06/28/17 1405      PT LONG TERM GOAL #1   Title  Pt will be independent with self MLD and self compression for head and neck lymphedema    Time  4    Period  Weeks    Status  New    Target Date  08/01/17      Additional Long Term Goals   Additional Long Term Goals  Yes      PT LONG TERM GOAL #6   Title  Pt will be independent with self MLD and self compression for head and neck lymphedema    Time  4    Period  Weeks    Status  New    Target Date  08/01/17      PT LONG TERM GOAL #7   Title  Pt will report decrease in subjective feelings of Lt neck stiffness in the morning by 50% or greater    Time  4    Period  Weeks    Status  New    Target Date  08/01/17      PT LONG TERM GOAL #8   Title  Pt will improve cervical AROM to no pulling experienced     Time  4    Period  Weeks    Status  New    Target Date  08/01/17      PT LONG TERM GOAL  #9   TITLE  Pt will be independent with HEP for shoulder and grip strength     Time  4    Period  Weeks    Status  New    Target Date  08/01/17         Head and Neck Clinic Goals - 11/30/16 1253      Patient will be able to verbalize understanding of a home exercise program for cervical range of motion, posture, and walking.    Status  Achieved      Patient will be  able to verbalize understanding of proper sitting and standing posture.    Status  Achieved      Patient will be able to verbalize understanding of lymphedema risk and availability of treatment for this condition.    Status  Achieved        Plan - 07/05/17 0847    Clinical Impression Statement  Pt tolerated beginning of treatment well today.  Some shoulder pain with overhead elevation,  Reported that the MLD felt good.  Tolerating the chip pack well     Clinical Impairments Affecting Rehab Potential  left spinal accessory nerve injury; remote H/O back surgery; malignant neoplasm of base of tongue with radiation treatment    PT Frequency  2x / week    PT Duration  4 weeks    PT Treatment/Interventions  Therapeutic activities;Therapeutic exercise;Patient/family education;Manual techniques;Energy conservation;Manual lymph drainage;Taping    PT Next Visit Plan  continue facial MLD, UT work, neck and shoulder mobility        Patient will benefit from skilled therapeutic intervention in order to improve the following deficits and impairments:  Decreased strength, Pain, Increased fascial restricitons, Decreased activity tolerance, Decreased coordination  Visit Diagnosis: Aftercare following surgery for neoplasm  Lymphedema, not elsewhere classified  Cervicalgia  Stiffness of right shoulder, not elsewhere classified  Stiffness of left shoulder, not elsewhere classified  Muscle weakness (generalized)     Problem List Patient Active Problem List   Diagnosis Date Noted  . Acute encephalopathy 05/15/2017  . UTI (urinary tract infection) 05/15/2017  . Hypotension 02/10/2017  . 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 Malignant neoplasm of base of tongue ([place]) 10/29/2016  . 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[place]) 10/29/2016  . Headache associated with sexual activity 05/14/2016  . Tongue lesion 05/14/2016  . Loose stools 03/12/2016  .  Hypocalcemia 01/22/2016  . Thrombocytopenia ([place]) 01/22/2016  . Chronic diarrhea 01/22/2016  . Chest pain   . Essential hypertension   . Orthostatic hypotension   . Spinal stenosis of lumbar region 01/20/2016    Class: Chronic  . DDD (degenerative disc disease), lumbar 01/20/2016    Class: Chronic  . Spinal stenosis of lumbar region with neurogenic claudication 01/20/2016  . Low back pain 12/22/2015  . Pre-op examination 12/22/2015  . OSA (obstructive sleep apnea) 10/13/2015  . Muscle cramp 08/01/2015  . GERD (gastroesophageal reflux disease) 07/09/2015  . Excessive daytime sleepiness 06/18/2015  . Shingles 08/28/2013  . Postop check 08/28/2013  . Cholelithiasis 07/10/2013  . Colitis 01/08/2013  . Postoperative anemia 01/08/2013  . Morbid obesity with BMI of 40.0-44.9, adult [facility]) 01/08/2013   Shan Levans, PT 07/05/2017, 8:50 AM  [place], [place], [address] Phone: [phone]   Fax:  [phone]  Name: Kristen [name] MRN: [id number] Date of Birth: Sep 29, 1965

## 2017-07-06 ENCOUNTER — Encounter: Payer: Self-pay | Admitting: Physical Therapy

## 2017-07-06 ENCOUNTER — Ambulatory Visit: Payer: BC Managed Care – PPO | Admitting: Physical Therapy

## 2017-07-06 DIAGNOSIS — M6281 Muscle weakness (generalized): Secondary | ICD-10-CM | POA: Diagnosis not present

## 2017-07-06 DIAGNOSIS — R279 Unspecified lack of coordination: Secondary | ICD-10-CM

## 2017-07-06 NOTE — Therapy (Signed)
[facility] [address], [place], [place], [address] Phone: [phone]   Fax:  [phone]  Physical Therapy Treatment  Patient Details  Name: NYOKA ALCOSER MRN: [id number] Date of Birth: Apr 25, 1965 Referring Provider: Dr. Eppie Gibson   Encounter Date: 07/06/2017  PT End of Session - 07/05/17 0847    Visit Number  6 2 for lymphedema    Date for PT Re-Evaluation  08/01/17    PT Start Time  0800    PT Stop Time  0844    PT Time Calculation (min)  44 min    Activity Tolerance  Patient tolerated treatment well    Behavior During Therapy  [facility] for tasks assessed/performed       Past Medical History:  Diagnosis Date  . Allergy   . Anemia    Iron deficiency  . Anxiety   . Arthritis    back, left shoulder  . Cancer ([place])   . Chicken pox   . Depression   . Diarrhea    takes Imodium daily  . GERD (gastroesophageal reflux disease)   . H/O hiatal hernia   . Headache(784.0)   . History of kidney stones    1996ish  . History of radiation therapy 11/16/16- 01/01/17   Base of Tongue/ 66 gy in 33 fractions/ Dose: 2 Gy  . Hypertension   . Migraine    none for 5 years (as of 01/13/16)  . OSA (obstructive sleep apnea) 10/13/2015   unable to get cpap, plans to get one in 2018  . Pneumonia   . Restless legs   . Scoliosis   . Shortness of breath    with exertion    Past Surgical History:  Procedure Laterality Date  . BACK SURGERY  07/21/1978  . CHOLECYSTECTOMY N/A 08/15/2013   Procedure: LAPAROSCOPIC CHOLECYSTECTOMY WITH INTRAOPERATIVE CHOLANGIOGRAM;  Surgeon: Gwenyth Ober, MD;  Location: [place];  Service: General;  Laterality: N/A;  . COLONOSCOPY N/A 01/09/2013   Procedure: COLONOSCOPY;  Surgeon: Beryle Beams, MD;  Location: [place];  Service: Endoscopy;  Laterality: N/A;  . DIRECT LARYNGOSCOPY  07/2016   Dr. Nicolette Bang [facility]  . GASTROSTOMY TUBE PLACEMENT  09/27/2016  . HERNIA REPAIR Left 1981  . IR  PATIENT EVAL TECH 0-60 MINS  12/13/2016  . IR PATIENT EVAL TECH 0-60 MINS  04/11/2017  . MODIFIED RADICAL NECK DISSECTION Left 09/27/2016   Levels 1 & 2  . PARTIAL GLOSSECTOMY Left 09/27/2016   Left hemi partial glossectomy  . SPINE SURGERY  01/20/2016   fusion  . TONSILLECTOMY    . tracheotomy  09/27/2016  . TUBAL LIGATION  06/1988    There were no vitals filed for this visit.  Subjective Assessment - 07/06/17 1152    Subjective  I am working on getting this feeding tube out by December due to eating better. I need to call and schedule with Dr. Louanne Skye for a shot. The breathing exercises help me to have a bowel movement.     Pertinent History  Diagnosis is adenoid cystic carcinoma.  Had left hemiglossectomy and resection of base of tongue with neck dissection 09/27/16 at [facility]; had trach placed and removed.  hx of lumbar fusion in 2018    Limitations  Lifting    Patient Stated Goals  improve UE strength, control the swelling in the neck and face    Currently in Pain?  Yes    Pain Score  4     Pain  Location  Hip    Pain Orientation  Right    Pain Descriptors / Indicators  Shooting    Pain Type  Acute pain    Pain Onset  1 to 4 weeks ago    Pain Frequency  Intermittent    Aggravating Factors   sitting for a long period    Pain Relieving Factors  bend to the left    Multiple Pain Sites  No                  Outpatient [facility] from [facility]28/2017 [facility]  Lymphedema Life Impact Scale Total Score  38.24 %           [facility] Adult PT Treatment/Exercise - 07/06/17 0001      Lumbar Exercises: Aerobic   Stationary Bike  8 min level 1      Lumbar Exercises: Machines for Strengthening   Cybex Knee Extension  bil. 15# 30x    Cybex Knee Flexion  knee flexion 30# 30x    Leg Press  seat #8 incline #4 bil. 35# 3x10 while contracting the pelvic floor      Lumbar Exercises: Standing   Other Standing Lumbar Exercises  standing hip abduction wiht  1#, extension with 1#, marching 1# for 15x               PT Short Term Goals - 06/22/17 1113      PT SHORT TERM GOAL #1   Title  pt will be independent with her initial HEP for pelvic floor contraction in hookly    Time  4    Period  Weeks    Status  Achieved      PT SHORT TERM GOAL #2   Title  understand what vaginal moisturizers are and how they improve vaginal health    Time  4    Period  Weeks    Status  Achieved      PT SHORT TERM GOAL #3   Title  understand how to toilet correctly to decrease straining of the pelvic floor     Time  4    Period  Weeks    Status  Achieved        PT Long Term Goals - 07/06/17 1204      PT LONG TERM GOAL #1   Title  Pt will be independent with self MLD and self compression for head and neck lymphedema    Time  4    Period  Weeks    Status  New      PT LONG TERM GOAL #3   Title  ability to toilet without straining 50% of the time due to improved muscle coordination    Time  8    Period  Weeks    Status  Achieved      PT LONG TERM GOAL #4   Title  ability to get to the commode without leakage urine due to improve pelvic floor coordination    Time  8    Period  Weeks    Status  On-going      PT LONG TERM GOAL #5   Title  pain with penile penetration decreased >/= 50% due to improve tissue mobility and moisture of the vagina    Time  8    Period  Weeks    Status  Achieved         Head and Neck Clinic Goals - 11/30/16 1253  Patient will be able to verbalize understanding of a home exercise program for cervical range of motion, posture, and walking.    Status  Achieved      Patient will be able to verbalize understanding of proper sitting and standing posture.    Status  Achieved      Patient will be able to verbalize understanding of lymphedema risk and availability of treatment for this condition.    Status  Achieved        Plan - 07/06/17 1222    Clinical Impression Statement   Patient has reduction of urinary leakage by 75%.  Patient has increased endurance of muscle.  Patient has no  pressure in the vaginal area.  Patient is having pain in low back and buttocks . She is going to call Dr. Louanne Skye for a shot.  Patient will benefit from skilled therapy to improve strength and reduce urinary leakage.     Rehab Potential  Excellent    Clinical Impairments Affecting Rehab Potential  left spinal accessory nerve injury; remote H/O back surgery; malignant neoplasm of base of tongue with radiation treatment    PT Frequency  1x / week    PT Duration  8 weeks    PT Treatment/Interventions  Therapeutic activities;Therapeutic exercise;Patient/family education;Manual techniques;Energy conservation;Manual lymph drainage;Biofeedback;Balance training;Neuromuscular re-education    PT Next Visit Plan  advanced pelvic floor contraction and abdominal strength    PT Home Exercise Plan  progress as needed    Consulted and Agree with Plan of Care  Patient       Patient will benefit from skilled therapeutic intervention in order to improve the following deficits and impairments:  Decreased strength, Decreased activity tolerance, Decreased coordination, Pain, Increased fascial restricitons  Visit Diagnosis: Muscle weakness (generalized)  Unspecified lack of coordination     Problem List Patient Active Problem List   Diagnosis Date Noted  . Acute encephalopathy 05/15/2017  . UTI (urinary tract infection) 05/15/2017  . Hypotension 02/10/2017  . 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 Malignant neoplasm of base of tongue ([place]) 10/29/2016  . 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 Headache associated with sexual activity 05/14/2016  . Tongue lesion 05/14/2016  . Loose stools 03/12/2016  . Hypocalcemia 01/22/2016  . Thrombocytopenia ([place]) 01/22/2016  . Chronic diarrhea 01/22/2016  . Chest pain   . Essential hypertension   . Orthostatic  hypotension   . Spinal stenosis of lumbar region 01/20/2016    Class: Chronic  . DDD (degenerative disc disease), lumbar 01/20/2016    Class: Chronic  . Spinal stenosis of lumbar region with neurogenic claudication 01/20/2016  . Low back pain 12/22/2015  . Pre-op examination 12/22/2015  . OSA (obstructive sleep apnea) 10/13/2015  . Muscle cramp 08/01/2015  . GERD (gastroesophageal reflux disease) 07/09/2015  . Excessive daytime sleepiness 06/18/2015  . Shingles 08/28/2013  . Postop check 08/28/2013  . Cholelithiasis 07/10/2013  . Colitis 01/08/2013  . Postoperative anemia 01/08/2013  . Morbid obesity with BMI of 40.0-44.9, adult [facility]) 01/08/2013    Earlie Counts, PT 07/06/17 12:28 PM   [place] [address] [place], [place], [address] Phone: [phone]   Fax:  [phone]  Name: JOIA DOYLE MRN: [id number] Date of Birth: Jun 24, 1965

## 2017-07-07 ENCOUNTER — Encounter (INDEPENDENT_AMBULATORY_CARE_PROVIDER_SITE_OTHER): Payer: Self-pay | Admitting: Surgery

## 2017-07-07 ENCOUNTER — Ambulatory Visit: Payer: BC Managed Care – PPO

## 2017-07-07 DIAGNOSIS — M25611 Stiffness of right shoulder, not elsewhere classified: Secondary | ICD-10-CM

## 2017-07-07 DIAGNOSIS — I890 Lymphedema, not elsewhere classified: Secondary | ICD-10-CM

## 2017-07-07 DIAGNOSIS — M542 Cervicalgia: Secondary | ICD-10-CM

## 2017-07-07 DIAGNOSIS — Z483 Aftercare following surgery for neoplasm: Secondary | ICD-10-CM

## 2017-07-07 DIAGNOSIS — M6281 Muscle weakness (generalized): Secondary | ICD-10-CM

## 2017-07-07 DIAGNOSIS — M25612 Stiffness of left shoulder, not elsewhere classified: Secondary | ICD-10-CM

## 2017-07-07 NOTE — Therapy (Signed)
[place], [place], [address] Phone: [phone]   Fax:  [phone]  Physical Therapy Treatment  Patient Details  Name: Kristen [name] MRN: [id number] Date of Birth: 12/02/1965 Referring Provider: Dr. Eppie Gibson   Encounter Date: 07/07/2017  PT End of Session - 07/07/17 0849    Visit Number  7 3 for lymphedema    Number of Visits  8 also 8 for lymphedema    Date for PT Re-Evaluation  08/01/17    PT Start Time  0803    PT Stop Time  0848    PT Time Calculation (min)  45 min    Activity Tolerance  Patient tolerated treatment well    Behavior During Therapy  [facility] for tasks assessed/performed       Past Medical History:  Diagnosis Date  . Allergy   . Anemia    Iron deficiency  . Anxiety   . Arthritis    back, left shoulder  . Cancer ([place])   . Chicken pox   . Depression   . Diarrhea    takes Imodium daily  . GERD (gastroesophageal reflux disease)   . H/O hiatal hernia   . Headache(784.0)   . History of kidney stones    1996ish  . History of radiation therapy 11/16/16- 01/01/17   Base of Tongue/ 66 gy in 33 fractions/ Dose: 2 Gy  . Hypertension   . Migraine    none for 5 years (as of 01/13/16)  . OSA (obstructive sleep apnea) 10/13/2015   unable to get cpap, plans to get one in 2018  . Pneumonia   . Restless legs   . Scoliosis   . Shortness of breath    with exertion    Past Surgical History:  Procedure Laterality Date  . BACK SURGERY  07/21/1978  . CHOLECYSTECTOMY N/A 08/15/2013   Procedure: LAPAROSCOPIC CHOLECYSTECTOMY WITH INTRAOPERATIVE CHOLANGIOGRAM;  Surgeon: Gwenyth Ober, MD;  Location: [facility];  Service: General;  Laterality: N/A;  . COLONOSCOPY N/A 01/09/2013   Procedure: COLONOSCOPY;  Surgeon: Beryle Beams, MD;  Location: [facility];  Service: Endoscopy;  Laterality: N/A;  . DIRECT LARYNGOSCOPY  07/2016   Dr. Nicolette Bang [facility]  . GASTROSTOMY TUBE PLACEMENT  09/27/2016   . HERNIA REPAIR Left 1981  . IR PATIENT EVAL TECH 0-60 MINS  12/13/2016  . IR PATIENT EVAL TECH 0-60 MINS  04/11/2017  . MODIFIED RADICAL NECK DISSECTION Left 09/27/2016   Levels 1 & 2  . PARTIAL GLOSSECTOMY Left 09/27/2016   Left hemi partial glossectomy  . SPINE SURGERY  01/20/2016   fusion  . TONSILLECTOMY    . tracheotomy  09/27/2016  . TUBAL LIGATION  06/1988    There were no vitals filed for this visit.  Subjective Assessment - 07/07/17 0805    Subjective  I've been doing the shoulder exercises and they are going well. I'm planning on seeing my orthopedist for my hip. I'm hoping to get a shot for it soon. I've been eating more by mouth and htat's going pretty good. I see the dentist today about hopefully getting some dentures to help me eat better. Also I brought my chip pack today because it feels like it's getting flat.     Pertinent History  Diagnosis is adenoid cystic carcinoma.  Had left hemiglossectomy and resection of base of tongue with neck dissection 09/27/16 at [facility]; had trach placed and removed.  hx of lumbar fusion in 2018  Patient Stated Goals  improve UE strength, control the swelling in the neck and face    Currently in Pain?  Yes    Pain Score  5     Pain Location  Hip    Pain Orientation  Right    Pain Descriptors / Indicators  Aching    Pain Type  Acute pain    Pain Radiating Towards  into glut and low back    Pain Onset  1 to 4 weeks ago    Pain Frequency  Intermittent    Aggravating Factors   sitting for extended periods of time    Pain Relieving Factors  stretching away from that side                  [facility]patient [facility]  Lymphedema Life Impact Scale Total Score  38.24 %           [facility] Adult PT Treatment/Exercise - 07/07/17 0001      Shoulder Exercises: Prone   Horizontal ABduction 1  Strengthening;Left;10 reps;Theraband    Theraband Level (Shoulder Horizontal ABduction  1)  Level 3 (Green) laying on band to anchor it    Other Prone Exercises  Pt laying on green theraband to anchor it for "Y and I" to Lt shoulder 10 times each, reports very minimal pain, just weakness primarily felt, but pts ROM is very limited with this.      Shoulder Exercises: Pulleys   Flexion  2 minutes;Limitations    Flexion Limitations  Demonstration for correct technique    Scaption  --    ABduction  2 minutes;Limitations    ABduction Limitations  Demonstration for technique      Shoulder Exercises: Therapy Ball   Flexion  Both;10 reps [place] lean into end of stretch    ABduction  Left;5 reps;Limitations Same side lean into end of stretch    ABduction Limitations  Demonstration and tactile cues to decrease scapular compensation      Manual Therapy   Edema Management  Adjusted pts chip pack adding more foam    Manual Lymphatic Drainage (MLD)  in supine HOB short neck, 5 diaphramagtic breaths, short neck, bil shoulder collectors, bilateral axiilary nodes, anterior and lateral neck, submental focus , bil masseters and pre- and retroauriculars nodes along with suboccipital and then return of pathways               PT Education - 07/07/17 0849    Education provided  Yes    Education Details  Prone Lt scapular strengthening with green theraband    Person(s) Educated  Patient    Methods  Explanation;Demonstration;Handout    Comprehension  Verbalized understanding;Returned demonstration;Need further instruction       PT Short Term Goals - 06/22/17 1113      PT SHORT TERM GOAL #1   Title  pt will be independent with her initial HEP for pelvic floor contraction in hookly    Time  4    Period  Weeks    Status  Achieved      PT SHORT TERM GOAL #2   Title  understand what vaginal moisturizers are and how they improve vaginal health    Time  4    Period  Weeks    Status  Achieved      PT SHORT TERM GOAL #3   Title  understand how to toilet correctly to decrease straining  of the pelvic floor  Time  4    Period  Weeks    Status  Achieved        PT Long Term Goals - 07/06/17 1204      PT LONG TERM GOAL #1   Title  Pt will be independent with self MLD and self compression for head and neck lymphedema    Time  4    Period  Weeks    Status  New      PT LONG TERM GOAL #3   Title  ability to toilet without straining 50% of the time due to improved muscle coordination    Time  8    Period  Weeks    Status  Achieved      PT LONG TERM GOAL #4   Title  ability to get to the commode without leakage urine due to improve pelvic floor coordination    Time  8    Period  Weeks    Status  On-going      PT LONG TERM GOAL #5   Title  pain with penile penetration decreased >/= 50% due to improve tissue mobility and moisture of the vagina    Time  8    Period  Weeks    Status  Achieved         Plan - 07/07/17 0940    Clinical Impression Statement  Continued with AA/ROM focusing on instructing pt to decrease scapular compensation at end of ROM, abduction being the most prominent. Progressed HEP to include prone scapular strengthening with the Lt UE which pt tolerated very well. She was challenged by this noticing weakness but very little to no pain. Also continued with manual lymph drianage to head/neck and added more foam to her chip pack. Pt reports already noticing some imporvment with her Lt shoulder ROM as well as reduction of her fluid in neck.     Rehab Potential  Excellent    Clinical Impairments Affecting Rehab Potential  left spinal accessory nerve injury; remote H/O back surgery; malignant neoplasm of base of tongue with radiation treatment    PT Frequency  2x / week    PT Duration  4 weeks    PT Treatment/Interventions  Therapeutic activities;Therapeutic exercise;Patient/family education;Manual techniques;Energy conservation;Manual lymph drainage;Taping    PT Next Visit Plan  Cont with MLD to head/neck and progress Lt scapular and UT strength        Patient will benefit from skilled therapeutic intervention in order to improve the following deficits and impairments:  Decreased strength, Decreased activity tolerance, Decreased coordination, Pain, Increased fascial restricitons  Visit Diagnosis: Muscle weakness (generalized)  Aftercare following surgery for neoplasm  Lymphedema, not elsewhere classified  Cervicalgia  Stiffness of right shoulder, not elsewhere classified  Stiffness of left shoulder, not elsewhere classified     Problem List Patient Active Problem List   Diagnosis Date Noted  . Acute encephalopathy 05/15/2017  . UTI (urinary tract infection) 05/15/2017  . Hypotension 02/10/2017  . 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 Malignant neoplasm of base of tongue ([place]) 10/29/2016  . 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[place]) 10/29/2016  . Headache associated with sexual activity 05/14/2016  . Tongue lesion 05/14/2016  . Loose stools 03/12/2016  . Hypocalcemia 01/22/2016  . Thrombocytopenia ([place]) 01/22/2016  . Chronic diarrhea 01/22/2016  . Chest pain   . Essential hypertension   . Orthostatic hypotension   . Spinal stenosis of lumbar region 01/20/2016    Class: Chronic  .  DDD (degenerative disc disease), lumbar 01/20/2016    Class: Chronic  . Spinal stenosis of lumbar region with neurogenic claudication 01/20/2016  . Low back pain 12/22/2015  . Pre-op examination 12/22/2015  . OSA (obstructive sleep apnea) 10/13/2015  . Muscle cramp 08/01/2015  . GERD (gastroesophageal reflux disease) 07/09/2015  . Excessive daytime sleepiness 06/18/2015  . Shingles 08/28/2013  . Postop check 08/28/2013  . Cholelithiasis 07/10/2013  . Colitis 01/08/2013  . Postoperative anemia 01/08/2013  . Morbid obesity with BMI of 40.0-44.9, adult ([place]) 01/08/2013    Golda Acre 07/07/2017, 9:55 AM  [place], [place], [address] Phone: [phone]   Fax:  [phone]  Name: Kristen [name] MRN: [id number] Date of Birth: 31-Jul-1965

## 2017-07-07 NOTE — Patient Instructions (Signed)
Laying on your stomach on your green band with Lt arm hanging off edge of bed   1. Holding band lift arm out to side in line with shoulder 2. Then lift arm into a "Y"  3. Then lift arm into a "I"  Do each 10 times keeping shoulder blade down  Cancer Rehab [phone]

## 2017-07-08 ENCOUNTER — Ambulatory Visit (HOSPITAL_BASED_OUTPATIENT_CLINIC_OR_DEPARTMENT_OTHER): Payer: BC Managed Care – PPO | Attending: Internal Medicine | Admitting: Internal Medicine

## 2017-07-08 VITALS — Ht 69.0 in | Wt 187.0 lb

## 2017-07-08 DIAGNOSIS — G4733 Obstructive sleep apnea (adult) (pediatric): Secondary | ICD-10-CM

## 2017-07-08 DIAGNOSIS — G4710 Hypersomnia, unspecified: Secondary | ICD-10-CM

## 2017-07-08 DIAGNOSIS — R400 Somnolence: Secondary | ICD-10-CM | POA: Insufficient documentation

## 2017-07-08 DIAGNOSIS — R0683 Snoring: Secondary | ICD-10-CM | POA: Diagnosis not present

## 2017-07-11 ENCOUNTER — Ambulatory Visit: Payer: BC Managed Care – PPO | Admitting: Physical Therapy

## 2017-07-13 ENCOUNTER — Encounter: Payer: BC Managed Care – PPO | Admitting: Physical Therapy

## 2017-07-14 ENCOUNTER — Ambulatory Visit: Payer: BC Managed Care – PPO | Admitting: Rehabilitation

## 2017-07-18 ENCOUNTER — Ambulatory Visit: Payer: BC Managed Care – PPO | Attending: Radiation Oncology

## 2017-07-18 DIAGNOSIS — M25612 Stiffness of left shoulder, not elsewhere classified: Secondary | ICD-10-CM | POA: Insufficient documentation

## 2017-07-18 DIAGNOSIS — M542 Cervicalgia: Secondary | ICD-10-CM | POA: Diagnosis present

## 2017-07-18 DIAGNOSIS — Z483 Aftercare following surgery for neoplasm: Secondary | ICD-10-CM | POA: Diagnosis present

## 2017-07-18 DIAGNOSIS — I890 Lymphedema, not elsewhere classified: Secondary | ICD-10-CM | POA: Insufficient documentation

## 2017-07-18 DIAGNOSIS — M6281 Muscle weakness (generalized): Secondary | ICD-10-CM | POA: Insufficient documentation

## 2017-07-18 DIAGNOSIS — M25611 Stiffness of right shoulder, not elsewhere classified: Secondary | ICD-10-CM | POA: Diagnosis present

## 2017-07-18 NOTE — Patient Instructions (Signed)

## 2017-07-18 NOTE — Therapy (Signed)
[place], [place], [address] Phone: [phone]   Fax:  [phone]  Physical Therapy Treatment  Patient Details  Name: Kristen [name] MRN: [id number] Date of Birth: 05/19/1965 Referring Provider: Dr. Eppie Gibson   Encounter Date: 07/18/2017  PT End of Session - 07/18/17 1054    Visit Number  8 4 for lymphedema    Number of Visits  8    Date for PT Re-Evaluation  08/01/17    PT Start Time  1022    PT Stop Time  1104    PT Time Calculation (min)  42 min    Activity Tolerance  Patient tolerated treatment well    Behavior During Therapy  [facility] for tasks assessed/performed       Past Medical History:  Diagnosis Date  . Allergy   . Anemia    Iron deficiency  . Anxiety   . Arthritis    back, left shoulder  . Cancer ([place])   . Chicken pox   . Depression   . Diarrhea    takes Imodium daily  . GERD (gastroesophageal reflux disease)   . H/O hiatal hernia   . Headache(784.0)   . History of kidney stones    1996ish  . History of radiation therapy 11/16/16- 01/01/17   Base of Tongue/ 66 gy in 33 fractions/ Dose: 2 Gy  . Hypertension   . Migraine    none for 5 years (as of 01/13/16)  . OSA (obstructive sleep apnea) 10/13/2015   unable to get cpap, plans to get one in 2018  . Pneumonia   . Restless legs   . Scoliosis   . Shortness of breath    with exertion    Past Surgical History:  Procedure Laterality Date  . BACK SURGERY  07/21/1978  . CHOLECYSTECTOMY N/A 08/15/2013   Procedure: LAPAROSCOPIC CHOLECYSTECTOMY WITH INTRAOPERATIVE CHOLANGIOGRAM;  Surgeon: Gwenyth Ober, MD;  Location: [facility];  Service: General;  Laterality: N/A;  . COLONOSCOPY N/A 01/09/2013   Procedure: COLONOSCOPY;  Surgeon: Beryle Beams, MD;  Location: [facility];  Service: Endoscopy;  Laterality: N/A;  . DIRECT LARYNGOSCOPY  07/2016   Dr. Nicolette Bang [facility]  . GASTROSTOMY TUBE PLACEMENT  09/27/2016  . HERNIA REPAIR Left  1981  . IR PATIENT EVAL TECH 0-60 MINS  12/13/2016  . IR PATIENT EVAL TECH 0-60 MINS  04/11/2017  . MODIFIED RADICAL NECK DISSECTION Left 09/27/2016   Levels 1 & 2  . PARTIAL GLOSSECTOMY Left 09/27/2016   Left hemi partial glossectomy  . SPINE SURGERY  01/20/2016   fusion  . TONSILLECTOMY    . tracheotomy  09/27/2016  . TUBAL LIGATION  06/1988    There were no vitals filed for this visit.  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 I was exhausted after the first day but I was good the rest of the week after that. No pain right now, just have some tightness in my Lt shoulder. My swelling is doing much better. I've been wearing the chip pack and doing the self MLD and I can tell a big improvement.     Pertinent History  Diagnosis is adenoid cystic carcinoma.  Had left hemiglossectomy and resection of base of tongue with neck dissection 09/27/16 at [facility]; had trach placed and removed.  hx of lumbar fusion in 2018    Patient Stated Goals  improve UE strength, control the swelling in the neck and face    Currently in Pain?  No/denies                  Outpatient Rehab from [facility]/28/2017 [facility]  Lymphedema Life Impact Scale Total Score  38.24 %           [facility] Adult PT Treatment/Exercise - 07/18/17 0001      Shoulder Exercises: Supine   Horizontal ABduction  Strengthening;Both;10 reps;Theraband Pt returned demonstration of all following exs    Theraband Level (Shoulder Horizontal ABduction)  Level 1 (Yellow)    External Rotation  Strengthening;Both;10 reps;Theraband    Theraband Level (Shoulder External Rotation)  Level 1 (Yellow)    Flexion  Strengthening;Both;10 reps;Theraband Narrow and Wide Grip, 10 times each    Theraband Level (Shoulder Flexion)  Level 1 (Yellow)    Diagonals  Strengthening;Both;10 reps;Theraband    Theraband Level (Shoulder Diagonals)   Level 1 (Yellow)      Shoulder Exercises: Pulleys   Flexion  2 minutes    Flexion Limitations  VCs throughout to decrease scauplar compensation    ABduction  2 minutes    ABduction Limitations  Tactile and VCs plus demonstration throughout to decrease scapular compensation and for correct UE position      Shoulder Exercises: Therapy Ball   Flexion  Both;10 reps Forward lean into end of ROM    ABduction  Left;10 reps Same side lean into end of stretch    ABduction Limitations  VCs to remind pt of correct technique      Shoulder Exercises: Stretch   Corner Stretch  3 reps;20 seconds      Manual Therapy   Manual Lymphatic Drainage (MLD)  Briefly at end of session today: in supine HOB short neck, 5 diaphramagtic breaths, short neck, bil shoulder collectors, bilateral axiilary nodes, anterior and lateral neck, submental focus and then return of pathways              PT Education - 07/18/17 1042    Education provided  Yes    Education Details  Supine scapular series with yellow but pt has green to progress to later at home    Person(s) Educated  Patient    Methods  Explanation;Demonstration;Handout    Comprehension  Verbalized understanding;Returned demonstration       PT Short Term Goals - 06/22/17 1113      PT SHORT TERM GOAL #1   Title  pt will be independent with her initial HEP for pelvic floor contraction in hookly    Time  4    Period  Weeks    Status  Achieved      PT SHORT TERM GOAL #2   Title  understand what vaginal moisturizers are and how they improve vaginal health    Time  4    Period  Weeks    Status  Achieved      PT SHORT TERM GOAL #3   Title  understand how to toilet correctly to decrease straining of the pelvic floor     Time  4    Period  Weeks    Status  Achieved        PT Long Term Goals - 07/06/17 1204      PT LONG TERM GOAL #1   Title  Pt will be independent with self MLD and self compression for head and neck lymphedema    Time  4  Period  Weeks    Status  New      PT LONG TERM GOAL #3   Title  ability to toilet without straining 50% of the time due to improved muscle coordination    Time  8    Period  Weeks    Status  Achieved      PT LONG TERM GOAL #4   Title  ability to get to the commode without leakage urine due to improve pelvic floor coordination    Time  8    Period  Weeks    Status  On-going      PT LONG TERM GOAL #5   Title  pain with penile penetration decreased >/= 50% due to improve tissue mobility and moisture of the vagina    Time  8    Period  Weeks    Status  Achieved         Head and Neck Clinic Goals - 11/30/16 1253      Patient will be able to verbalize understanding of a home exercise program for cervical range of motion, posture, and walking.    Status  Achieved      Patient will be able to verbalize understanding of proper sitting and standing posture.    Status  Achieved      Patient will be able to verbalize understanding of lymphedema risk and availability of treatment for this condition.    Status  Achieved        Plan - 07/18/17 1055    Clinical Impression Statement  Pt is progressing very well. She reports compliance with her self MLD and wearing chip pack. Also has been doing her HEP and can tell an improvement with her ROM already.     Rehab Potential  Excellent    Clinical Impairments Affecting Rehab Potential  left spinal accessory nerve injury; remote H/O back surgery; malignant neoplasm of base of tongue with radiation treatment    PT Frequency  2x / week    PT Duration  4 weeks    PT Treatment/Interventions  Therapeutic activities;Therapeutic exercise;Patient/family education;Manual techniques;Energy conservation;Manual lymph drainage;Taping    PT Next Visit Plan  Renewal next visit. Cont with MLD to head/neck and progress Lt scapular and UT strength    Consulted and Agree with Plan of Care  Patient       Patient will benefit from skilled  therapeutic intervention in order to improve the following deficits and impairments:  Decreased strength, Decreased activity tolerance, Decreased coordination, Pain, Increased fascial restricitons  Visit Diagnosis: Muscle weakness (generalized)  Aftercare following surgery for neoplasm  Lymphedema, not elsewhere classified  Cervicalgia  Stiffness of right shoulder, not elsewhere classified  Stiffness of left shoulder, not elsewhere classified     Problem List Patient Active Problem List   Diagnosis Date Noted  . Acute encephalopathy 05/15/2017  . UTI (urinary tract infection) 05/15/2017  . Hypotension 02/10/2017  . 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 Malignant neoplasm of base of tongue ([place]) 10/29/2016  . 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[place]) 10/29/2016  . Headache associated with sexual activity 05/14/2016  . Tongue lesion 05/14/2016  . Loose stools 03/12/2016  . Hypocalcemia 01/22/2016  . Thrombocytopenia ([place]) 01/22/2016  . Chronic diarrhea 01/22/2016  . Chest pain   . Essential hypertension   . Orthostatic hypotension   . Spinal stenosis of lumbar region 01/20/2016    Class: Chronic  . DDD (degenerative disc disease), lumbar 01/20/2016  Class: Chronic  . Spinal stenosis of lumbar region with neurogenic claudication 01/20/2016  . Low back pain 12/22/2015  . Pre-op examination 12/22/2015  . OSA (obstructive sleep apnea) 10/13/2015  . Muscle cramp 08/01/2015  . GERD (gastroesophageal reflux disease) 07/09/2015  . Excessive daytime sleepiness 06/18/2015  . Shingles 08/28/2013  . Postop check 08/28/2013  . Cholelithiasis 07/10/2013  . Colitis 01/08/2013  . Postoperative anemia 01/08/2013  . Morbid obesity with BMI of 40.0-44.9, adult [facility]) 01/08/2013    Otelia Limes, PTA 07/18/2017, 11:07 AM  [place], [place], [address] Phone:  [phone]   Fax:  [phone]  Name: OKLA QAZI MRN: [id number] Date of Birth: 09-26-1965

## 2017-07-20 ENCOUNTER — Ambulatory Visit: Payer: BC Managed Care – PPO | Attending: Radiation Oncology | Admitting: Physical Therapy

## 2017-07-20 ENCOUNTER — Encounter: Payer: Self-pay | Admitting: Physical Therapy

## 2017-07-20 ENCOUNTER — Ambulatory Visit: Payer: BC Managed Care – PPO

## 2017-07-20 DIAGNOSIS — M6281 Muscle weakness (generalized): Secondary | ICD-10-CM

## 2017-07-20 DIAGNOSIS — I890 Lymphedema, not elsewhere classified: Secondary | ICD-10-CM

## 2017-07-20 DIAGNOSIS — M542 Cervicalgia: Secondary | ICD-10-CM

## 2017-07-20 DIAGNOSIS — R279 Unspecified lack of coordination: Secondary | ICD-10-CM | POA: Diagnosis present

## 2017-07-20 DIAGNOSIS — Z483 Aftercare following surgery for neoplasm: Secondary | ICD-10-CM | POA: Diagnosis present

## 2017-07-20 DIAGNOSIS — M25612 Stiffness of left shoulder, not elsewhere classified: Secondary | ICD-10-CM

## 2017-07-20 DIAGNOSIS — M25611 Stiffness of right shoulder, not elsewhere classified: Secondary | ICD-10-CM

## 2017-07-20 NOTE — Therapy (Signed)
[place], [place], [address] Phone: [phone]   Fax:  [phone]  Physical Therapy Treatment  Patient Details  Name: Kristen [name] MRN: [id number] Date of Birth: 11-16-1965 Referring Provider: Dr. Eppie Gibson   Encounter Date: 07/20/2017  PT End of Session - 07/20/17 1458    Visit Number  10 5/8 for lymphedema    Number of Visits  10    Date for PT Re-Evaluation  08/01/17    PT Start Time  1439 Pt arrived late    PT Stop Time  1518    PT Time Calculation (min)  39 min    Activity Tolerance  Patient tolerated treatment well    Behavior During Therapy  [facility] for tasks assessed/performed       Past Medical History:  Diagnosis Date  . Allergy   . Anemia    Iron deficiency  . Anxiety   . Arthritis    back, left shoulder  . Cancer ([place])   . Chicken pox   . Depression   . Diarrhea    takes Imodium daily  . GERD (gastroesophageal reflux disease)   . H/O hiatal hernia   . Headache(784.0)   . History of kidney stones    1996ish  . History of radiation therapy 11/16/16- 01/01/17   Base of Tongue/ 66 gy in 33 fractions/ Dose: 2 Gy  . Hypertension   . Migraine    none for 5 years (as of 01/13/16)  . OSA (obstructive sleep apnea) 10/13/2015   unable to get cpap, plans to get one in 2018  . Pneumonia   . Restless legs   . Scoliosis   . Shortness of breath    with exertion    Past Surgical History:  Procedure Laterality Date  . BACK SURGERY  07/21/1978  . CHOLECYSTECTOMY N/A 08/15/2013   Procedure: LAPAROSCOPIC CHOLECYSTECTOMY WITH INTRAOPERATIVE CHOLANGIOGRAM;  Surgeon: Gwenyth Ober, MD;  Location: [place];  Service: General;  Laterality: N/A;  . COLONOSCOPY N/A 01/09/2013   Procedure: COLONOSCOPY;  Surgeon: Beryle Beams, MD;  Location: [facility];  Service: Endoscopy;  Laterality: N/A;  . DIRECT LARYNGOSCOPY  07/2016   Dr. Nicolette Bang [facility]  . GASTROSTOMY TUBE PLACEMENT  09/27/2016  .  HERNIA REPAIR Left 1981  . IR PATIENT EVAL TECH 0-60 MINS  12/13/2016  . IR PATIENT EVAL TECH 0-60 MINS  04/11/2017  . MODIFIED RADICAL NECK DISSECTION Left 09/27/2016   Levels 1 & 2  . PARTIAL GLOSSECTOMY Left 09/27/2016   Left hemi partial glossectomy  . SPINE SURGERY  01/20/2016   fusion  . TONSILLECTOMY    . tracheotomy  09/27/2016  . TUBAL LIGATION  06/1988    There were no vitals filed for this visit.  Subjective Assessment - 07/20/17 1441    Subjective  I felt good after our last visit. I can tell my posture is getting better even though I have to watch myself from leaning over all the time.     Pertinent History  Diagnosis is adenoid cystic carcinoma.  Had left hemiglossectomy and resection of base of tongue with neck dissection 09/27/16 at [facility]; had trach placed and removed.  hx of lumbar fusion in 2018    Patient Stated Goals  improve UE strength, control the swelling in the neck and face    Currently in Pain?  No/denies                  Outpatient  Rehab from 06/28/2017 in [place]  Lymphedema Life Impact Scale Total Score  38.24 %           [facility] Adult PT Treatment/Exercise - 07/20/17 1454      Shoulder Exercises: Supine   Horizontal ABduction  Strengthening;Both;15 reps;Theraband    Theraband Level (Shoulder Horizontal ABduction)  Level 2 (Red)    External Rotation  Strengthening;Both;15 reps;Theraband    Theraband Level (Shoulder External Rotation)  Level 2 (Red)    Flexion  Strengthening;Both;15 reps;Theraband Narrow and Wide Grip, 15 times each     Theraband Level (Shoulder Flexion)  Level 2 (Red)    Diagonals  Strengthening;Both;15 reps;Theraband    Theraband Level (Shoulder Diagonals)  Level 2 (Red)      Manual Therapy   Manual Lymphatic Drainage (MLD)  In supine HOB short neck, 5 diaphramagtic breaths, short neck, bil shoulder collectors, bilateral axiilary nodes, anterior and lateral neck, submental focus and  then return of pathways                PT Short Term Goals - 06/22/17 1113      PT SHORT TERM GOAL #1   Title  pt will be independent with her initial HEP for pelvic floor contraction in hookly    Time  4    Period  Weeks    Status  Achieved      PT SHORT TERM GOAL #2   Title  understand what vaginal moisturizers are and how they improve vaginal health    Time  4    Period  Weeks    Status  Achieved      PT SHORT TERM GOAL #3   Title  understand how to toilet correctly to decrease straining of the pelvic floor     Time  4    Period  Weeks    Status  Achieved        PT Long Term Goals - 07/20/17 1143      PT LONG TERM GOAL #2   Title  pelvic floor strength >/= 3/5 and hold for 10 seconds to reduce urinary leakage >/= 75%    Time  8    Period  Weeks    Status  On-going      PT LONG TERM GOAL #3   Title  ability to toilet without straining 50% of the time due to improved muscle coordination    Time  8    Period  Weeks    Status  Achieved      PT LONG TERM GOAL #4   Title  ability to get to the commode without leakage urine due to improve pelvic floor coordination    Period  Weeks    Status  Achieved      PT LONG TERM GOAL #5   Title  pain with penile penetration decreased >/= 50% due to improve tissue mobility and moisture of the vagina    Baseline  80% better    Time  8    Period  Weeks    Status  Achieved         Head and Neck Clinic Goals - 11/30/16 1253      Patient will be able to verbalize understanding of a home exercise program for cervical range of motion, posture, and walking.    Status  Achieved      Patient will be able to verbalize understanding of proper sitting and standing posture.    Status  Achieved  Patient will be able to verbalize understanding of lymphedema risk and availability of treatment for this condition.    Status  Achieved        Plan - 07/20/17 1458    Clinical Impression Statement  Pt is  progressing well towards independence with her HEP for postural strength so progressed supine scapular series with red theraband and she tolerated this very well today. Also continued with manual lymph drainage to neck. Pt reports she plans to get the head/neck garment at then end of the month.    Rehab Potential  Excellent    Clinical Impairments Affecting Rehab Potential  left spinal accessory nerve injury; remote H/O back surgery; malignant neoplasm of base of tongue with radiation treatment    PT Frequency  2x / week    PT Duration  4 weeks    PT Treatment/Interventions  Therapeutic activities;Therapeutic exercise;Patient/family education;Manual techniques;Energy conservation;Manual lymph drainage;Taping    PT Next Visit Plan  Cont with MLD to head/neck and , add 3 way raises; progress Lt scapular and UT strength for cancer rehab; lower extermity strength for pelvic floor therapy    Consulted and Agree with Plan of Care  Patient       Patient will benefit from skilled therapeutic intervention in order to improve the following deficits and impairments:  Decreased strength, Decreased activity tolerance, Decreased coordination, Pain, Increased fascial restricitons  Visit Diagnosis: Muscle weakness (generalized)  Aftercare following surgery for neoplasm  Lymphedema, not elsewhere classified  Cervicalgia  Stiffness of right shoulder, not elsewhere classified  Stiffness of left shoulder, not elsewhere classified     Problem List Patient Active Problem List   Diagnosis Date Noted  . Acute encephalopathy 05/15/2017  . UTI (urinary tract infection) 05/15/2017  . Hypotension 02/10/2017  . 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 Malignant neoplasm of base of tongue ([place]) 10/29/2016  . 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[place]) 10/29/2016  . Headache associated with sexual activity 05/14/2016  . Tongue lesion 05/14/2016  . Loose stools 03/12/2016  .  Hypocalcemia 01/22/2016  . Thrombocytopenia ([place]) 01/22/2016  . Chronic diarrhea 01/22/2016  . Chest pain   . Essential hypertension   . Orthostatic hypotension   . Spinal stenosis of lumbar region 01/20/2016    Class: Chronic  . DDD (degenerative disc disease), lumbar 01/20/2016    Class: Chronic  . Spinal stenosis of lumbar region with neurogenic claudication 01/20/2016  . Low back pain 12/22/2015  . Pre-op examination 12/22/2015  . OSA (obstructive sleep apnea) 10/13/2015  . Muscle cramp 08/01/2015  . GERD (gastroesophageal reflux disease) 07/09/2015  . Excessive daytime sleepiness 06/18/2015  . Shingles 08/28/2013  . Postop check 08/28/2013  . Cholelithiasis 07/10/2013  . Colitis 01/08/2013  . Postoperative anemia 01/08/2013  . Morbid obesity with BMI of 40.0-44.9, adult ([place]) 01/08/2013    Otelia Limes, PTA 07/20/2017, 3:19 PM  [place], [place], [address] Phone: [phone]   Fax:  [phone]  Name: Kristen [name] MRN: [id number] Date of Birth: [birth year]

## 2017-07-20 NOTE — Therapy (Signed)
[facility] [address], [place], [place], [address] Phone: [phone]   Fax:  [phone]  Physical Therapy Treatment  Patient Details  Name: Kristen [name] MRN: [id number] Date of Birth: 1965-09-22 Referring Provider: Dr. Eppie Gibson   Encounter Date: 07/20/2017  PT End of Session - 07/20/17 1109    Visit Number  9    Number of Visits  9    Date for PT Re-Evaluation  08/01/17    Authorization Type  [employer]    PT Start Time  1100    PT Stop Time  1140    PT Time Calculation (min)  40 min    Activity Tolerance  Patient tolerated treatment well    Behavior During Therapy  [facility] for tasks assessed/performed       Past Medical History:  Diagnosis Date  . Allergy   . Anemia    Iron deficiency  . Anxiety   . Arthritis    back, left shoulder  . Cancer ([place])   . Chicken pox   . Depression   . Diarrhea    takes Imodium daily  . GERD (gastroesophageal reflux disease)   . H/O hiatal hernia   . Headache(784.0)   . History of kidney stones    1996ish  . History of radiation therapy 11/16/16- 01/01/17   Base of Tongue/ 66 gy in 33 fractions/ Dose: 2 Gy  . Hypertension   . Migraine    none for 5 years (as of 01/13/16)  . OSA (obstructive sleep apnea) 10/13/2015   unable to get cpap, plans to get one in 2018  . Pneumonia   . Restless legs   . Scoliosis   . Shortness of breath    with exertion    Past Surgical History:  Procedure Laterality Date  . BACK SURGERY  07/21/1978  . CHOLECYSTECTOMY N/A 08/15/2013   Procedure: LAPAROSCOPIC CHOLECYSTECTOMY WITH INTRAOPERATIVE CHOLANGIOGRAM;  Surgeon: Gwenyth Ober, MD;  Location: [place];  Service: General;  Laterality: N/A;  . COLONOSCOPY N/A 01/09/2013   Procedure: COLONOSCOPY;  Surgeon: Beryle Beams, MD;  Location: [name];  Service: Endoscopy;  Laterality: N/A;  . DIRECT LARYNGOSCOPY  07/2016   Dr. Nicolette Bang [facility]  . GASTROSTOMY TUBE PLACEMENT  09/27/2016  .  HERNIA REPAIR Left 1981  . IR PATIENT EVAL TECH 0-60 MINS  12/13/2016  . IR PATIENT EVAL TECH 0-60 MINS  04/11/2017  . MODIFIED RADICAL NECK DISSECTION Left 09/27/2016   Levels 1 & 2  . PARTIAL GLOSSECTOMY Left 09/27/2016   Left hemi partial glossectomy  . SPINE SURGERY  01/20/2016   fusion  . TONSILLECTOMY    . tracheotomy  09/27/2016  . TUBAL LIGATION  06/1988    There were no vitals filed for this visit.  Subjective Assessment - 07/20/17 1104    Subjective  Patient reports 95% better with leakage.  I only hae it when I am moving alot. I can sit at home without a pad.     Pertinent History  Diagnosis is adenoid cystic carcinoma.  Had left hemiglossectomy and resection of base of tongue with neck dissection 09/27/16 at [facility]; had trach placed and removed.  hx of lumbar fusion in 2018    Limitations  Lifting    Patient Stated Goals  improve UE strength, control the swelling in the neck and face    Currently in Pain?  No/denies  [facility]  Lymphedema Life Impact Scale Total Score  38.24 %           [facility] Adult PT Treatment/Exercise - 07/20/17 0001      Lumbar Exercises: Stretches   Active Hamstring Stretch  Right;Left;2 reps;30 seconds    Hip Flexor Stretch  Right;Left;2 reps;30 seconds    Other Lumbar Stretch Exercise  hip adductor stretch  in standing       Lumbar Exercises: Aerobic   Nustep  level 1 for 10 min, seat#10, arm #10      Lumbar Exercises: Machines for Strengthening   Cybex Knee Extension  bil. 15# 30x    Leg Press  seat #8 incline #4 bil. 40# 3x10      Lumbar Exercises: Standing   Other Standing Lumbar Exercises  standing hip abduction wiht 1#, extension with 1#, marching 1# for 15x               PT Short Term Goals - 06/22/17 1113      PT SHORT TERM GOAL #1   Title  pt will be independent with her initial HEP for pelvic floor contraction in hookly     Time  4    Period  Weeks    Status  Achieved      PT SHORT TERM GOAL #2   Title  understand what vaginal moisturizers are and how they improve vaginal health    Time  4    Period  Weeks    Status  Achieved      PT SHORT TERM GOAL #3   Title  understand how to toilet correctly to decrease straining of the pelvic floor     Time  4    Period  Weeks    Status  Achieved        PT Long Term Goals - 07/20/17 1143      PT LONG TERM GOAL #2   Title  pelvic floor strength >/= 3/5 and hold for 10 seconds to reduce urinary leakage >/= 75%    Time  8    Period  Weeks    Status  On-going      PT LONG TERM GOAL #3   Title  ability to toilet without straining 50% of the time due to improved muscle coordination    Time  8    Period  Weeks    Status  Achieved      PT LONG TERM GOAL #4   Title  ability to get to the commode without leakage urine due to improve pelvic floor coordination    Period  Weeks    Status  Achieved      PT LONG TERM GOAL #5   Title  pain with penile penetration decreased >/= 50% due to improve tissue mobility and moisture of the vagina    Baseline  80% better    Time  8    Period  Weeks    Status  Achieved         Head and Neck Clinic Goals - 11/30/16 1253      Patient will be able to verbalize understanding of a home exercise program for cervical range of motion, posture, and walking.    Status  Achieved      Patient will be able to verbalize understanding of proper sitting and standing posture.    Status  Achieved      Patient will be able to verbalize  understanding of lymphedema risk and availability of treatment for this condition.    Status  Achieved        Plan - 07/20/17 1133    Clinical Impression Statement  Patient reports her urinary leakage is 95% better and she is able to go wihtout a pad when not at home. Patient will benefit from skilled therapy to improve lower extremity strength and pelvic floor strength.      Rehab Potential  Excellent    Clinical Impairments Affecting Rehab Potential  left spinal accessory nerve injury; remote H/O back surgery; malignant neoplasm of base of tongue with radiation treatment    PT Frequency  1x / week    PT Duration  8 weeks    PT Treatment/Interventions  Therapeutic activities;Therapeutic exercise;Patient/family education;Manual techniques;Energy conservation;Manual lymph drainage;Taping    PT Next Visit Plan  Renewal next visit. Cont with MLD to head/neck and progress Lt scapular and UT strength for cancer rehab; lower extermity strength for pelvic floor therapy    PT Home Exercise Plan  progress as needed    Consulted and Agree with Plan of Care  Patient       Patient will benefit from skilled therapeutic intervention in order to improve the following deficits and impairments:     Visit Diagnosis: Muscle weakness (generalized)  Unspecified lack of coordination  Aftercare following surgery for neoplasm     Problem List Patient Active Problem List   Diagnosis Date Noted  . Acute encephalopathy 05/15/2017  . UTI (urinary tract infection) 05/15/2017  . Hypotension 02/10/2017  . 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 Malignant neoplasm of base of tongue ([place]) 10/29/2016  . 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[place]) 10/29/2016  . Headache associated with sexual activity 05/14/2016  . Tongue lesion 05/14/2016  . Loose stools 03/12/2016  . Hypocalcemia 01/22/2016  . Thrombocytopenia ([place]) 01/22/2016  . Chronic diarrhea 01/22/2016  . Chest pain   . Essential hypertension   . Orthostatic hypotension   . Spinal stenosis of lumbar region 01/20/2016    Class: Chronic  . DDD (degenerative disc disease), lumbar 01/20/2016    Class: Chronic  . Spinal stenosis of lumbar region with neurogenic claudication 01/20/2016  . Low back pain 12/22/2015  . Pre-op examination 12/22/2015  . OSA (obstructive sleep apnea) 10/13/2015  . Muscle  cramp 08/01/2015  . GERD (gastroesophageal reflux disease) 07/09/2015  . Excessive daytime sleepiness 06/18/2015  . Shingles 08/28/2013  . Postop check 08/28/2013  . Cholelithiasis 07/10/2013  . Colitis 01/08/2013  . Postoperative anemia 01/08/2013  . Morbid obesity with BMI of 40.0-44.9, adult [facility]) 01/08/2013    Earlie Counts, PT 07/20/17 11:44 AM   [facility] [address], [place], [place], [address] Phone: [phone]   Fax:  [phone]  Name: DAILA ELBERT MRN: [id number] Date of Birth: 1965-08-25

## 2017-07-22 ENCOUNTER — Emergency Department (HOSPITAL_COMMUNITY)
Admission: EM | Admit: 2017-07-22 | Discharge: 2017-07-22 | Disposition: A | Discharge status: Home / Self Care | Payer: BC Managed Care – PPO | Attending: Emergency Medicine | Admitting: Emergency Medicine

## 2017-07-22 ENCOUNTER — Emergency Department (HOSPITAL_COMMUNITY): Payer: BC Managed Care – PPO

## 2017-07-22 ENCOUNTER — Other Ambulatory Visit: Payer: Self-pay

## 2017-07-22 ENCOUNTER — Encounter (HOSPITAL_COMMUNITY): Payer: Self-pay | Admitting: Emergency Medicine

## 2017-07-22 DIAGNOSIS — R200 Anesthesia of skin: Secondary | ICD-10-CM

## 2017-07-22 DIAGNOSIS — R2681 Unsteadiness on feet: Secondary | ICD-10-CM | POA: Diagnosis not present

## 2017-07-22 DIAGNOSIS — Z862 Personal history of diseases of the blood and blood-forming organs and certain disorders involving the immune mechanism: Secondary | ICD-10-CM | POA: Insufficient documentation

## 2017-07-22 DIAGNOSIS — I10 Essential (primary) hypertension: Secondary | ICD-10-CM | POA: Diagnosis not present

## 2017-07-22 DIAGNOSIS — R5383 Other fatigue: Secondary | ICD-10-CM | POA: Diagnosis not present

## 2017-07-22 DIAGNOSIS — Z7982 Long term (current) use of aspirin: Secondary | ICD-10-CM | POA: Insufficient documentation

## 2017-07-22 DIAGNOSIS — R531 Weakness: Secondary | ICD-10-CM | POA: Diagnosis not present

## 2017-07-22 DIAGNOSIS — Z79899 Other long term (current) drug therapy: Secondary | ICD-10-CM | POA: Insufficient documentation

## 2017-07-22 LAB — BASIC METABOLIC PANEL
ANION GAP: 7 (ref 5–15)
BUN: 15 mg/dL (ref 6–20)
CO2: 28 mmol/L (ref 22–32)
Calcium: 9.1 mg/dL (ref 8.9–10.3)
Chloride: 112 mmol/L — ABNORMAL HIGH (ref 98–111)
Creatinine, Ser: 0.67 mg/dL (ref 0.44–1.00)
Glucose, Bld: 104 mg/dL — ABNORMAL HIGH (ref 70–99)
Potassium: 4.1 mmol/L (ref 3.5–5.1)
SODIUM: 147 mmol/L — AB (ref 135–145)

## 2017-07-22 LAB — I-STAT TROPONIN, ED: TROPONIN I, POC: 0 ng/mL (ref 0.00–0.08)

## 2017-07-22 LAB — URINALYSIS, ROUTINE W REFLEX MICROSCOPIC
Bacteria, UA: NONE SEEN
Bilirubin Urine: NEGATIVE
GLUCOSE, UA: NEGATIVE mg/dL
HGB URINE DIPSTICK: NEGATIVE
KETONES UR: NEGATIVE mg/dL
NITRITE: NEGATIVE
PH: 6 (ref 5.0–8.0)
Protein, ur: NEGATIVE mg/dL
Specific Gravity, Urine: 1.011 (ref 1.005–1.030)

## 2017-07-22 LAB — I-STAT CHEM 8, ED
BUN: 13 mg/dL (ref 6–20)
CALCIUM ION: 1.21 mmol/L (ref 1.15–1.40)
CHLORIDE: 109 mmol/L (ref 98–111)
Creatinine, Ser: 0.7 mg/dL (ref 0.44–1.00)
GLUCOSE: 101 mg/dL — AB (ref 70–99)
HCT: 32 % — ABNORMAL LOW (ref 36.0–46.0)
Hemoglobin: 10.9 g/dL — ABNORMAL LOW (ref 12.0–15.0)
Potassium: 4 mmol/L (ref 3.5–5.1)
SODIUM: 146 mmol/L — AB (ref 135–145)
TCO2: 27 mmol/L (ref 22–32)

## 2017-07-22 LAB — CBG MONITORING, ED: Glucose-Capillary: 94 mg/dL (ref 70–99)

## 2017-07-22 LAB — DIFFERENTIAL
BASOS ABS: 0 10*3/uL (ref 0.0–0.1)
Basophils Relative: 0 %
EOS PCT: 4 %
Eosinophils Absolute: 0.2 10*3/uL (ref 0.0–0.7)
LYMPHS ABS: 0.9 10*3/uL (ref 0.7–4.0)
LYMPHS PCT: 20 %
Monocytes Absolute: 0.4 10*3/uL (ref 0.1–1.0)
Monocytes Relative: 9 %
NEUTROS PCT: 67 %
Neutro Abs: 2.8 10*3/uL (ref 1.7–7.7)

## 2017-07-22 LAB — RAPID URINE DRUG SCREEN, HOSP PERFORMED
Amphetamines: NOT DETECTED
BENZODIAZEPINES: NOT DETECTED
Cocaine: NOT DETECTED
Opiates: NOT DETECTED
Tetrahydrocannabinol: NOT DETECTED

## 2017-07-22 LAB — I-STAT BETA HCG BLOOD, ED (MC, WL, AP ONLY): I-stat hCG, quantitative: <5 m[IU]/mL (ref <5)

## 2017-07-22 LAB — CBC
HCT: 35.2 % — ABNORMAL LOW (ref 36.0–46.0)
Hemoglobin: 10.9 g/dL — ABNORMAL LOW (ref 12.0–15.0)
MCH: 27 pg (ref 26.0–34.0)
MCHC: 31 g/dL (ref 30.0–36.0)
MCV: 87.1 fL (ref 78.0–100.0)
Platelets: 167 10*3/uL (ref 150–400)
RBC: 4.04 MIL/uL (ref 3.87–5.11)
RDW: 15.4 % (ref 11.5–15.5)
WBC: 4.3 10*3/uL (ref 4.0–10.5)

## 2017-07-22 LAB — ETHANOL: Alcohol, Ethyl (B): <10 mg/dL (ref <10)

## 2017-07-22 LAB — PROTIME-INR
INR: 0.95
PROTHROMBIN TIME: 12.6 s (ref 11.4–15.2)

## 2017-07-22 LAB — APTT: aPTT: 29 seconds (ref 24–36)

## 2017-07-22 MED ORDER — LORAZEPAM 2 MG/ML IJ SOLN
1.0000 mg | Freq: Once | INTRAMUSCULAR | Status: AC
  Administered 2017-07-22: 1 mg via INTRAVENOUS
  Filled 2017-07-22: qty 1

## 2017-07-22 NOTE — ED Triage Notes (Signed)
Pt verbalizes fatigue, "cant stay awake," and right arm numbness onset 0500 yesterday; denies arm numbness at present. No extremity weakness noted with triage but pt appears to have unsteady gait.

## 2017-07-22 NOTE — Discharge Instructions (Addendum)
Work-up here today without any specific findings.  MRI brain was done without any evidence of stroke.  Follow-up with your doctor.  Also make an appointment to follow-up with neurology.  Return for any new or worse symptoms.  One thing that we are not able to rule in or rule out would be a thyroid problem.  Primary care doctor should be able to get thyroid function test.

## 2017-07-22 NOTE — ED Provider Notes (Signed)
[facility] DEPT Provider Note   CSN: [id number] Arrival date & time: 07/22/17  0900     History   Chief Complaint Chief Complaint  Patient presents with  . Fatigue  . Numbness    HPI Kristen [name] is a 52 y.o. female.  Patient with onset at 6 in the morning yesterday with right arm weakness and tingling to the fingers.  And patient being very fatigued and having difficulty staying awake.  Patient denies any leg weakness.  But triage did report that there seems to be an unsteady gait.  Patient has had some surgery on her tongue so her speech is normally off.  Patient based on timing is outside of stroke window.     Past Medical History:  Diagnosis Date  . Allergy   . Anemia    Iron deficiency  . Anxiety   . Arthritis    back, left shoulder  . Cancer ([place])   . Chicken pox   . Depression   . Diarrhea    takes Imodium daily  . GERD (gastroesophageal reflux disease)   . H/O hiatal hernia   . Headache(784.0)   . History of kidney stones    1996ish  . History of radiation therapy 11/16/16- 01/01/17   Base of Tongue/ 66 gy in 33 fractions/ Dose: 2 Gy  . Hypertension   . Migraine    none for 5 years (as of 01/13/16)  . OSA (obstructive sleep apnea) 10/13/2015   unable to get cpap, plans to get one in 2018  . Pneumonia   . Restless legs   . Scoliosis   . Shortness of breath    with exertion    Patient Active Problem List   Diagnosis Date Noted  . Acute encephalopathy 05/15/2017  . UTI (urinary tract infection) 05/15/2017  . Hypotension 02/10/2017  . 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 Malignant neoplasm of base of tongue ([place]) 10/29/2016  . 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 Headache associated with sexual activity 05/14/2016  . Tongue lesion 05/14/2016  . Loose stools 03/12/2016  . Hypocalcemia 01/22/2016  . Thrombocytopenia ([place]) 01/22/2016  . Chronic diarrhea 01/22/2016    . Chest pain   . Essential hypertension   . Orthostatic hypotension   . Spinal stenosis of lumbar region 01/20/2016    Class: Chronic  . DDD (degenerative disc disease), lumbar 01/20/2016    Class: Chronic  . Spinal stenosis of lumbar region with neurogenic claudication 01/20/2016  . Low back pain 12/22/2015  . Pre-op examination 12/22/2015  . OSA (obstructive sleep apnea) 10/13/2015  . Muscle cramp 08/01/2015  . GERD (gastroesophageal reflux disease) 07/09/2015  . Excessive daytime sleepiness 06/18/2015  . Shingles 08/28/2013  . Postop check 08/28/2013  . Cholelithiasis 07/10/2013  . Colitis 01/08/2013  . Postoperative anemia 01/08/2013  . Morbid obesity with BMI of 40.0-44.9, adult ([place]) 01/08/2013    Past Surgical History:  Procedure Laterality Date  . BACK SURGERY  07/21/1978  . CHOLECYSTECTOMY N/A 08/15/2013   Procedure: LAPAROSCOPIC CHOLECYSTECTOMY WITH INTRAOPERATIVE CHOLANGIOGRAM;  Surgeon: Gwenyth Ober, MD;  Location: [place];  Service: General;  Laterality: N/A;  . COLONOSCOPY N/A 01/09/2013   Procedure: COLONOSCOPY;  Surgeon: Beryle Beams, MD;  Location: [facility];  Service: Endoscopy;  Laterality: N/A;  . DIRECT LARYNGOSCOPY  07/2016   Dr. Nicolette Bang [facility]  . GASTROSTOMY TUBE PLACEMENT  09/27/2016  . HERNIA REPAIR Left 1981  .  IR PATIENT EVAL TECH 0-60 MINS  12/13/2016  . IR PATIENT EVAL TECH 0-60 MINS  04/11/2017  . MODIFIED RADICAL NECK DISSECTION Left 09/27/2016   Levels 1 & 2  . PARTIAL GLOSSECTOMY Left 09/27/2016   Left hemi partial glossectomy  . SPINE SURGERY  01/20/2016   fusion  . TONSILLECTOMY    . tracheotomy  09/27/2016  . TUBAL LIGATION  06/1988     OB History   None      Home Medications    Prior to Admission medications   Medication Sig Start Date End Date Taking? Authorizing Provider  amLODipine (NORVASC) 10 MG tablet Take 1 tablet (10 mg total) daily by mouth. 11/30/16  Yes [name], MD  aspirin 81 MG chewable tablet Chew 81  mg by mouth daily.  10/05/16  Yes [provider]  baclofen (LIORESAL) 10 MG tablet Take 10 mg by mouth 3 (three) times daily.  05/10/17  Yes [provider]  calcium carbonate (OSCAL) 1500 (600 Ca) MG TABS tablet Take by mouth 2 (two) times daily with a meal.   Yes [provider]  chlorhexidine (PERIDEX) 0.12 % solution Use as directed 15 mLs in the mouth or throat 2 (two) times daily as needed (oral pain).  06/27/17  Yes [provider]  diclofenac (VOLTAREN) 75 MG EC tablet TAKE ONE TABLET BY MOUTH TWICE DAILY WITH FOOD AS NEEDED FOR PAIN Patient taking differently: Take 75 mg by mouth 2 (two) times daily.  11/22/16  Yes Jessy Oto, MD  Eluxadoline (VIBERZI) 100 MG TABS Take 1 tablet (100 mg total) by mouth 2 (two) times daily. 03/23/17  Yes [name], MD  ergocalciferol (DRISDOL) 8000 UNIT/ML drops Place 6 mLs (48,000 Units total) into feeding tube once a week. 03/04/17  Yes [name], MD  gabapentin (NEURONTIN) 300 MG capsule Take 1 capsule (300 mg total) by mouth 3 (three) times daily. 05/26/17  Yes [name], MD  lidocaine (XYLOCAINE) 2 % solution    Yes [provider]  lidocaine (XYLOCAINE) 5 % ointment Apply 1 application topically as needed for mild pain (back area).   Yes [provider]  LORazepam (ATIVAN) 0.5 MG tablet Take 1 tab PRN nausea. Do not take more than 3 pills daily. Patient taking differently: Take 0.5 mg by mouth 3 (three) times daily as needed (nausea).  12/31/16  Yes Eppie Gibson, MD  pantoprazole (PROTONIX) 40 MG tablet Take 1 tablet (40 mg total) daily by mouth. 11/30/16  Yes [name], MD  Potassium 99 MG TABS 1 tablet (99 mg total) by Gastrostomy Tube route daily. 05/26/17  Yes [name], MD  SUMAtriptan (IMITREX) 100 MG tablet Take 0.5-1 tablet by mouth at the onset of migraine and may repeat in 2 hours if headache persists or recurs. 05/14/16  Yes Golden Circle, FNP  tiZANidine  (ZANAFLEX) 4 MG tablet Take 1 tablet by mouth daily as needed for pain. 05/30/17  Yes [provider]  traMADol (ULTRAM) 50 MG tablet TAKE 1 TABLET BY MOUTH EVERY 6 HOURS AS NEEDED FOR PAIN 06/24/17  Yes Jessy Oto, MD  Eluxadoline (VIBERZI) 100 MG TABS Take 1 tablet (100 mg total) by mouth 2 (two) times daily. Patient not taking: Reported on 07/22/2017 05/27/17   [name], MD    Family History Family History  Problem Relation Age of Onset  . Heart disease Father   . Heart attack Father   .  Hypertension Mother   . Arthritis Mother   . Diabetes Maternal Grandmother   . Diabetes Paternal Grandmother   . Diabetes Maternal Uncle     Social History Social History   Tobacco Use  . Smoking status: Never Smoker  . Smokeless tobacco: Never Used  Substance Use Topics  . Alcohol use: Yes    Alcohol/week: 4.2 oz    Types: 7 Cans of beer per week    Comment: 1 beer daily  . Drug use: No     Allergies   Other and Sulfur   Review of Systems Review of Systems  Constitutional: Positive for fatigue.  HENT: Negative for congestion.   Eyes: Negative for visual disturbance.  Respiratory: Negative for shortness of breath.   Cardiovascular: Negative for chest pain.  Gastrointestinal: Negative for abdominal pain.  Genitourinary: Negative for dysuria.  Musculoskeletal: Negative for back pain.  Skin: Negative for rash.  Neurological: Positive for weakness and numbness. Negative for dizziness, speech difficulty and headaches.  Hematological: Does not bruise/bleed easily.  Psychiatric/Behavioral: Negative for confusion.     Physical Exam Updated Vital Signs BP 110/87 (BP Location: Left Arm)   Pulse (!) 59   Temp 97.6 F (36.4 C) (Oral)   Resp 15   Ht 1.753 m (5' 9" )   Wt 82.1 kg (181 lb)   LMP 11/15/2013   SpO2 99%   BMI 26.73 kg/m   Physical Exam  Constitutional: She is oriented to person, place, and time. She appears well-developed and well-nourished. No  distress.  HENT:  Head: Normocephalic and atraumatic.  Mouth/Throat: Oropharynx is clear and moist.  Eyes: Pupils are equal, round, and reactive to light. Conjunctivae and EOM are normal.  Neck: Neck supple.  Cardiovascular: Normal rate, regular rhythm and normal heart sounds.  Pulmonary/Chest: Effort normal and breath sounds normal. No respiratory distress.  Abdominal: Soft. Bowel sounds are normal. There is no tenderness.  Musculoskeletal: Normal range of motion. She exhibits no edema.  Neurological: She is alert and oriented to person, place, and time. No cranial nerve deficit or sensory deficit. Coordination normal.  Clinically has some mild right arm weakness.  Skin: Skin is warm. No rash noted.  Nursing note and vitals reviewed.    ED Treatments / Results  Labs (all labs ordered are listed, but only abnormal results are displayed) Labs Reviewed  BASIC METABOLIC PANEL - Abnormal; Notable for the following components:      Result Value   Sodium 147 (*)    Chloride 112 (*)    Glucose, Bld 104 (*)    All other components within normal limits  CBC - Abnormal; Notable for the following components:   Hemoglobin 10.9 (*)    HCT 35.2 (*)    All other components within normal limits  URINALYSIS, ROUTINE W REFLEX MICROSCOPIC - Abnormal; Notable for the following components:   Color, Urine AMBER (*)    Leukocytes, UA SMALL (*)    All other components within normal limits  RAPID URINE DRUG SCREEN, HOSP PERFORMED - Abnormal; Notable for the following components:   Barbiturates   (*)    Value: Result not available. Reagent lot number recalled by manufacturer.   All other components within normal limits  I-STAT CHEM 8, ED - Abnormal; Notable for the following components:   Sodium 146 (*)    Glucose, Bld 101 (*)    Hemoglobin 10.9 (*)    HCT 32.0 (*)    All other components within normal limits  ETHANOL  PROTIME-INR  APTT  DIFFERENTIAL  CBG MONITORING, ED  I-STAT BETA HCG  BLOOD, ED (MC, WL, AP ONLY)  I-STAT TROPONIN, ED    EKG EKG Interpretation  Date/Time:  Friday July 22 2017 09:24:17 EDT Ventricular Rate:  64 PR Interval:    QRS Duration: 101 QT Interval:  463 QTC Calculation: 478 R Axis:   -24 Text Interpretation:  Sinus rhythm Borderline left axis deviation Low voltage, precordial leads Confirmed by Fredia Sorrow [phone]) on 07/22/2017 10:06:40 AM Also confirmed by Fredia Sorrow [phone]), editor Lynder Parents [phone])  on 07/22/2017 2:17:24 PM   Radiology Ct Head Wo Contrast  Result Date: 07/22/2017 CLINICAL DATA:  Acute right arm numbness. EXAM: CT HEAD WITHOUT CONTRAST TECHNIQUE: Contiguous axial images were obtained from the base of the skull through the vertex without intravenous contrast. COMPARISON:  None. FINDINGS: Brain: No evidence of acute infarction, hemorrhage, hydrocephalus, extra-axial collection or mass lesion/mass effect. Vascular: No hyperdense vessel or unexpected calcification. Skull: Normal. Negative for fracture or focal lesion. Sinuses/Orbits: No acute finding. Other: None. IMPRESSION: Normal head CT. Electronically Signed   By: Marijo Conception, M.D.   On: 07/22/2017 10:11   Mr Brain Wo Contrast (neuro Protocol)  Result Date: 07/22/2017 CLINICAL DATA:  52 year old female with right arm numbness since yesterday. Fatigue. Unsteady gait. EXAM: MRI HEAD WITHOUT CONTRAST TECHNIQUE: Multiplanar, multiecho pulse sequences of the brain and surrounding structures were obtained without intravenous contrast. COMPARISON:  Head CT without contrast 0957 hours today. Brain MRI 04/26/2016. FINDINGS: Brain: No restricted diffusion to suggest acute infarction. Cavum septum pellucidum, normal variant. No midline shift, mass effect, evidence of mass lesion, ventriculomegaly, extra-axial collection or acute intracranial hemorrhage. Pituitary within normal limits. Mild cerebellar tonsillar ectopia is stable and within the range of normal variation. Stable  gray and white matter signal throughout the brain with mild for age scattered small nonspecific T2 and FLAIR hyperintense foci. No cortical encephalomalacia. No definite chronic cerebral blood products. Vascular: Major intracranial vascular flow voids are stable since 2018 and within normal limits. Skull and upper cervical spine: Stable and negative visible upper cervical spine. Visualized bone marrow signal is within normal limits. Sinuses/Orbits: Stable and negative. Other: Visible internal auditory structures appear normal. Mastoid air cells remain clear. Postoperative changes to the oral tongue with resection site scarring (series 10, image 20) are new since the 2018 study which identified an enhancing tongue mass in that region. Other scalp and face soft tissues appear negative. IMPRESSION: 1. No acute intracranial abnormality. Stable mild for age nonspecific white matter signal changes. 2. 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 Electronically Signed   By: Genevie Ann M.D.   On: 07/22/2017 14:24    Procedures Procedures (including critical care time)  Medications Ordered in ED Medications  LORazepam (ATIVAN) injection 1 mg (1 mg Intravenous Given 07/22/17 1327)     Initial Impression / Assessment and Plan / ED Course  I have reviewed the triage vital signs and the nursing notes.  Pertinent labs & imaging results that were available during my care of the patient were reviewed by me and considered in my medical decision making (see chart for details).     Work-up to include MRI without evidence of stroke.  Labs without any significant abnormalities.  Patient's blood sugar was normal.  CT head was negative as well.  Drug screen was negative.  No significant electrolyte abnormalities.  Patient's vital signs stable not hypoxic.  No fever.  Urinalysis negative.  Patient stable for discharge home.  Will need follow-up with primary care  doctor and neurology.  Would recommend primary care doctor evaluating thyroid function.  Final Clinical Impressions(s) / ED Diagnoses   Final diagnoses:  Fatigue, unspecified type  Numbness    ED Discharge Orders    None       Fredia Sorrow, MD 07/22/17 1640

## 2017-07-22 NOTE — ED Notes (Signed)
Patient transported to MRI 

## 2017-07-22 NOTE — ED Notes (Signed)
EDMD at bedside

## 2017-07-22 NOTE — ED Notes (Signed)
Informed by Nurse Tech that pt was able to ambulate with a steady gait.

## 2017-07-22 NOTE — ED Notes (Signed)
Tomi Bamberger MD made aware of pt status and complaint. Tele neuro cart at bedside for use.

## 2017-07-23 DIAGNOSIS — G4733 Obstructive sleep apnea (adult) (pediatric): Secondary | ICD-10-CM | POA: Diagnosis not present

## 2017-07-23 NOTE — Procedures (Signed)
    Patient Name: Kristen [name], Kristen [name] Date: 07/08/2017 Gender: Female D.O.B: [birth year] Age (years): 39 Referring Provider: Baird Lyons MD, ABSM Height (inches): 69 Interpreting Physician: Baird Lyons MD, ABSM Weight (lbs): 187 RPSGT: Earney [place] BMI: 28 MRN: [id number] Neck Size: 14.50  CLINICAL INFORMATION Sleep Study Type: NPSG Indication for sleep study: OSA  Epworth Sleepiness Score: 8  SLEEP STUDY TECHNIQUE As per the AASM Manual for the Scoring of Sleep and Associated Events v2.3 (April 2016) with a hypopnea requiring 4% desaturations.  The channels recorded and monitored were frontal, central and occipital EEG, electrooculogram (EOG), submentalis EMG (chin), nasal and oral airflow, thoracic and abdominal wall motion, anterior tibialis EMG, snore microphone, electrocardiogram, and pulse oximetry.  MEDICATIONS Medications self-administered by patient taken the night of the study : none reported  SLEEP ARCHITECTURE The study was initiated at 9:39:07 PM and ended at 4:32:40 AM.  Sleep onset time was 2.3 minutes and the sleep efficiency was 96.5%%. The total sleep time was 399.2 minutes.  Stage REM latency was 50.0 minutes.  The patient spent 0.5%% of the night in stage N1 sleep, 68.1%% in stage N2 sleep, 0.0%% in stage N3 and 31.44% in REM.  Alpha intrusion was absent.  Supine sleep was 46.43%.  RESPIRATORY PARAMETERS The overall apnea/hypopnea index (AHI) was 0.0 per hour. There were 0 total apneas, including 0 obstructive, 0 central and 0 mixed apneas. There were 0 hypopneas and 0 RERAs.  The AHI during Stage REM sleep was 0.0 per hour.  AHI while supine was 0.0 per hour.  The mean oxygen saturation was 95.0%. The minimum SpO2 during sleep was 92.0%.  moderate snoring was noted during this study.  CARDIAC DATA The 2 lead EKG demonstrated sinus rhythm. The mean heart rate was 55.3 beats per minute. Other EKG findings include: None.  LEG  MOVEMENT DATA The total PLMS were 0 with a resulting PLMS index of 0.0. Associated arousal with leg movement index was 0.0 .  IMPRESSIONS - No significant obstructive sleep apnea occurred during this study (AHI = 0.0/h). - No significant central sleep apnea occurred during this study (CAI = 0.0/h). - The patient had minimal or no oxygen desaturation during the study (Min O2 = 92.0%) - The patient snored with moderate snoring volume. - No cardiac abnormalities were noted during this study. - Clinically significant periodic limb movements did not occur during sleep. No significant associated arousals. - Short sleep latency with increased time in REM. Consider a primary disorder of excessive somnolence, such as narcolepsy.  DIAGNOSIS - Primary snoring - Excesive daytime somnolence  RECOMMENDATIONS - Consider MSLT or therapeutic stimulant trial. Discuss sleep time, naps and driving responsibility. - Sleep hygiene should be reviewed to assess factors that may improve sleep quality. - Weight management and regular exercise should be initiated or continued if appropriate.  [Electronically signed] 07/23/2017 02:40 PM  Baird Lyons MD, [place], American Board of Sleep Medicine   NPI: [id number]                         [place], [place] [facility]  ELECTRONICALLY SIGNED ON:  07/23/2017, 2:37 PM [place] PH: [phone]   FX: [phone] [place]

## 2017-07-25 ENCOUNTER — Ambulatory Visit: Payer: BC Managed Care – PPO | Admitting: Rehabilitation

## 2017-07-27 ENCOUNTER — Encounter: Payer: Self-pay | Admitting: Rehabilitation

## 2017-07-27 ENCOUNTER — Ambulatory Visit: Payer: Self-pay | Admitting: *Deleted

## 2017-07-27 ENCOUNTER — Ambulatory Visit: Payer: BC Managed Care – PPO | Admitting: Rehabilitation

## 2017-07-27 ENCOUNTER — Ambulatory Visit: Payer: BC Managed Care – PPO | Admitting: Physical Therapy

## 2017-07-27 ENCOUNTER — Ambulatory Visit: Payer: BC Managed Care – PPO | Admitting: Family Medicine

## 2017-07-27 ENCOUNTER — Encounter: Payer: Self-pay | Admitting: Family Medicine

## 2017-07-27 VITALS — BP 100/70 | HR 90 | Temp 98.7°F | Wt 189.0 lb

## 2017-07-27 DIAGNOSIS — R5383 Other fatigue: Secondary | ICD-10-CM | POA: Diagnosis not present

## 2017-07-27 DIAGNOSIS — R195 Other fecal abnormalities: Secondary | ICD-10-CM | POA: Diagnosis not present

## 2017-07-27 DIAGNOSIS — I890 Lymphedema, not elsewhere classified: Secondary | ICD-10-CM

## 2017-07-27 DIAGNOSIS — M6281 Muscle weakness (generalized): Secondary | ICD-10-CM | POA: Diagnosis not present

## 2017-07-27 DIAGNOSIS — Z483 Aftercare following surgery for neoplasm: Secondary | ICD-10-CM

## 2017-07-27 LAB — POCT URINALYSIS DIPSTICK
Bilirubin, UA: NEGATIVE
Blood, UA: NEGATIVE
Glucose, UA: NEGATIVE
Ketones, UA: NEGATIVE
Leukocytes, UA: NEGATIVE
NITRITE UA: NEGATIVE
PH UA: 6.5 (ref 5.0–8.0)
PROTEIN UA: NEGATIVE
Spec Grav, UA: 1.02 (ref 1.010–1.025)
UROBILINOGEN UA: 1 U/dL

## 2017-07-27 NOTE — Telephone Encounter (Signed)
TC to patient as requested by PCP to determine her stability to wait for her appointment later today. She is at work. The lightheadedness started approximately 2 weeks ago as well as numbness with right pinky finger that goes up to her forearm. She stated the lightheadedness occurs most of the time after she eats and her hands tremble at the same time. Reports feeling very fatigued most of her days. Slept all day yesterday. She was seen in the ER on 7/5 and reports all test were normal. She began having diarrhea yesterday and is now having to go every hour.She is on a feeding tube at this time and does feedings regularly. She speaks with a lisp due to tongue surgery last year. She denies vertigo/CP/SOB/fever/difficulty voiding.Encouraged her not to drive until her dizziness has resolved. She will have her daughter drive her to the appointment this afternoon.    Reason for Disposition . [1] MODERATE dizziness (e.g., interferes with normal activities) AND [2] has been evaluated by physician for this  Answer Assessment - Initial Assessment Questions 1. DESCRIPTION: "Describe your dizziness."     Feels like she will pass out. 2. LIGHTHEADED: "Do you feel lightheaded?" (e.g., somewhat faint, woozy, weak upon standing)     Yes, with standing and sitting. 3. VERTIGO: "Do you feel like either you or the room is spinning or tilting?" (i.e. vertigo)     no 4. SEVERITY: "How bad is it?"  "Do you feel like you are going to faint?" "Can you stand and walk?"   - MILD - walking normally   - MODERATE - interferes with normal activities (e.g., work, school)    - SEVERE - unable to stand, requires support to walk, feels like passing out now.      Yes, feels faint.  5. ONSET:  "When did the dizziness begin?"     one to two weeks ago 6. AGGRAVATING FACTORS: "Does anything make it worse?" (e.g., standing, change in head position)     After eating feels more dizzy. 7. HEART RATE: "Can you tell me your heart rate?"  "How many beats in 15 seconds?"  (Note: not all patients can do this)       no 8. CAUSE: "What do you think is causing the dizziness?"     no 9. RECURRENT SYMPTOM: "Have you had dizziness before?" If so, ask: "When was the last time?" "What happened that time?"     no 10. OTHER SYMPTOMS: "Do you have any other symptoms?" (e.g., fever, chest pain, vomiting, diarrhea, bleeding)       Every hour she is having a loose watery stool. 11. PREGNANCY: "Is there any chance you are pregnant?" "When was your last menstrual period?"       no  Protocols used: DIZZINESS [facility]

## 2017-07-27 NOTE — Therapy (Signed)
[place], [place], [address] Phone: [phone]   Fax:  [phone]  Physical Therapy Treatment  Patient Details  Name: Kristen [name] MRN: [id number] Date of Birth: March 03, 1965 Referring Provider: Dr. Eppie Gibson   Encounter Date: 07/27/2017  PT End of Session - 07/27/17 1339    Visit Number  6 6/9 for lymphedema    Number of Visits  10    Date for PT Re-Evaluation  08/01/17    PT Start Time  1307    PT Stop Time  1340    PT Time Calculation (min)  33 min    Activity Tolerance  -- limited due to not feeling well today    Behavior During Therapy  [facility] for tasks assessed/performed       Past Medical History:  Diagnosis Date  . Allergy   . Anemia    Iron deficiency  . Anxiety   . Arthritis    back, left shoulder  . Cancer ([place])   . Chicken pox   . Depression   . Diarrhea    takes Imodium daily  . GERD (gastroesophageal reflux disease)   . H/O hiatal hernia   . Headache(784.0)   . History of kidney stones    1996ish  . History of radiation therapy 11/16/16- 01/01/17   Base of Tongue/ 66 gy in 33 fractions/ Dose: 2 Gy  . Hypertension   . Migraine    none for 5 years (as of 01/13/16)  . OSA (obstructive sleep apnea) 10/13/2015   unable to get cpap, plans to get one in 2018  . Pneumonia   . Restless legs   . Scoliosis   . Shortness of breath    with exertion    Past Surgical History:  Procedure Laterality Date  . BACK SURGERY  07/21/1978  . CHOLECYSTECTOMY N/A 08/15/2013   Procedure: LAPAROSCOPIC CHOLECYSTECTOMY WITH INTRAOPERATIVE CHOLANGIOGRAM;  Surgeon: Gwenyth Ober, MD;  Location: [place];  Service: General;  Laterality: N/A;  . COLONOSCOPY N/A 01/09/2013   Procedure: COLONOSCOPY;  Surgeon: Beryle Beams, MD;  Location: [place];  Service: Endoscopy;  Laterality: N/A;  . DIRECT LARYNGOSCOPY  07/2016   Dr. Nicolette Bang [facility]  . GASTROSTOMY TUBE PLACEMENT  09/27/2016  . HERNIA  REPAIR Left 1981  . IR PATIENT EVAL TECH 0-60 MINS  12/13/2016  . IR PATIENT EVAL TECH 0-60 MINS  04/11/2017  . MODIFIED RADICAL NECK DISSECTION Left 09/27/2016   Levels 1 & 2  . PARTIAL GLOSSECTOMY Left 09/27/2016   Left hemi partial glossectomy  . SPINE SURGERY  01/20/2016   fusion  . TONSILLECTOMY    . tracheotomy  09/27/2016  . TUBAL LIGATION  06/1988    There were no vitals filed for this visit.  Subjective Assessment - 07/27/17 1308    Subjective  Was in the ER last week for Rt sided numbness and tingling in the 5th finger up the the mid forearm.  Reports it may be a bit better.  Still having constant diarrhea with the feeding tube, lightheadedness, fatigue, and stomach issues  "It only seems to happen when I do my feeding tube".  Going back to the MD this afternoon.  Dr. Volanda Napoleon.  Reports lack of funds for face compression but should get it soon.  Also reports she should be ok with DC next week with home care    Pertinent History  Diagnosis is adenoid cystic carcinoma.  Had left hemiglossectomy and resection of  base of tongue with neck dissection 09/27/16 at [facility]; had trach placed and removed.  hx of lumbar fusion in 2018    Patient Stated Goals  improve UE strength, control the swelling in the neck and face    Currently in Pain?  No/denies                  Outpatient [facility]  Lymphedema Life Impact Scale Total Score  38.24 %           [facility] Adult PT Treatment/Exercise - 07/27/17 0001      Manual Therapy   Edema Management  issued a new chin strap with chip foam due to pt not being able to afford the compression at this time.  Hoping to soon    Manual Lymphatic Drainage (MLD)  In supine HOB short neck, 5 diaphramagtic breaths, short neck, bil shoulder collectors, bilateral axiilary nodes, anterior and lateral neck, submental focus and then return of pathways                PT Short Term Goals -  06/22/17 1113      PT SHORT TERM GOAL #1   Title  pt will be independent with her initial HEP for pelvic floor contraction in hookly    Time  4    Period  Weeks    Status  Achieved      PT SHORT TERM GOAL #2   Title  understand what vaginal moisturizers are and how they improve vaginal health    Time  4    Period  Weeks    Status  Achieved      PT SHORT TERM GOAL #3   Title  understand how to toilet correctly to decrease straining of the pelvic floor     Time  4    Period  Weeks    Status  Achieved        PT Long Term Goals - 07/27/17 1341      PT LONG TERM GOAL #1   Title  Pt will be independent with self MLD and self compression for head and neck lymphedema    Status  On-going      PT LONG TERM GOAL #7   Title  Pt will report decrease in subjective feelings of Lt neck stiffness in the morning by 50% or greater    Status  On-going      PT LONG TERM GOAL #8   Title  Pt will improve cervical AROM to no pulling experienced     Status  On-going      PT LONG TERM GOAL  #9   TITLE  Pt will be independent with HEP for shoulder and grip strength     Status  On-going         Head and Neck Clinic Goals - 11/30/16 1253      Patient will be able to verbalize understanding of a home exercise program for cervical range of motion, posture, and walking.    Status  Achieved      Patient will be able to verbalize understanding of proper sitting and standing posture.    Status  Achieved      Patient will be able to verbalize understanding of lymphedema risk and availability of treatment for this condition.    Status  Achieved        Plan - 07/27/17 1340    Clinical Impression Statement  Unable to perform TE  today due to fatigue, lightheadedness, and stomach pain, but wanted to do her MLD component of treatment.  Tolerated this well close to independence.  Reports not shoulder issues at this time.      Clinical Impairments Affecting Rehab Potential  left  spinal accessory nerve injury; remote H/O back surgery; malignant neoplasm of base of tongue with radiation treatment    PT Treatment/Interventions  Therapeutic activities;Therapeutic exercise;Patient/family education;Manual techniques;Energy conservation;Manual lymph drainage;Taping    PT Next Visit Plan  for last week; continue MLD, assess ind with self MLD and HEP       Patient will benefit from skilled therapeutic intervention in order to improve the following deficits and impairments:  Decreased strength, Decreased activity tolerance, Decreased coordination, Pain, Increased fascial restricitons  Visit Diagnosis: Aftercare following surgery for neoplasm  Lymphedema, not elsewhere classified     Problem List Patient Active Problem List   Diagnosis Date Noted  . Acute encephalopathy 05/15/2017  . UTI (urinary tract infection) 05/15/2017  . Hypotension 02/10/2017  . 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 Malignant neoplasm of base of tongue ([place]) 10/29/2016  . 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[place]) 10/29/2016  . Headache associated with sexual activity 05/14/2016  . Tongue lesion 05/14/2016  . Loose stools 03/12/2016  . Hypocalcemia 01/22/2016  . Thrombocytopenia ([place]) 01/22/2016  . Chronic diarrhea 01/22/2016  . Chest pain   . Essential hypertension   . Orthostatic hypotension   . Spinal stenosis of lumbar region 01/20/2016    Class: Chronic  . DDD (degenerative disc disease), lumbar 01/20/2016    Class: Chronic  . Spinal stenosis of lumbar region with neurogenic claudication 01/20/2016  . Low back pain 12/22/2015  . Pre-op examination 12/22/2015  . OSA (obstructive sleep apnea) 10/13/2015  . Muscle cramp 08/01/2015  . GERD (gastroesophageal reflux disease) 07/09/2015  . Excessive daytime sleepiness 06/18/2015  . Shingles 08/28/2013  . Postop check 08/28/2013  . Cholelithiasis 07/10/2013  . Colitis 01/08/2013  . Postoperative anemia  01/08/2013  . Morbid obesity with BMI of 40.0-44.9, adult [facility]) 01/08/2013    Shan Levans, PT 07/27/2017, 1:42 PM  [place], [place], [address] Phone: [phone]   Fax:  [phone]  Name: Kristen [name] MRN: [id number] Date of Birth: [birth year]

## 2017-07-27 NOTE — Patient Instructions (Signed)

## 2017-07-27 NOTE — Progress Notes (Signed)
Subjective:    Patient ID: Kristen [name], female    DOB: 07-20-1965, 52 y.o.   MRN: [id number]  No chief complaint on file.   HPI Patient was seen today for for follow-up.  Seen in the ED on 7/5 for fatigue, numbness in the right hand, lightheadedness, and loose stools.  Pt having 2 loose stools but endorses some leakage for which she has been wearing a pad.  Pt's diet has not changed, still eating some po and using her PEG tube.  Pt states she has been falling asleep during the day and is less talkative than she normally is.  FSBS at lunch was 87.  Pt states the last time this happened she had a UTI.  Labs at ED were negative except for H/H 10.9/ 32.0 and UA with small leuks.  Pt was not sent home on any meds.    Past Medical History:  Diagnosis Date  . Allergy   . Anemia    Iron deficiency  . Anxiety   . Arthritis    back, left shoulder  . Cancer ([place])   . Chicken pox   . Depression   . Diarrhea    takes Imodium daily  . GERD (gastroesophageal reflux disease)   . H/O hiatal hernia   . Headache(784.0)   . History of kidney stones    1996ish  . History of radiation therapy 11/16/16- 01/01/17   Base of Tongue/ 66 gy in 33 fractions/ Dose: 2 Gy  . Hypertension   . Migraine    none for 5 years (as of 01/13/16)  . OSA (obstructive sleep apnea) 10/13/2015   unable to get cpap, plans to get one in 2018  . Pneumonia   . Restless legs   . Scoliosis   . Shortness of breath    with exertion    Allergies  Allergen Reactions  . Other Hives and Swelling    tomato  . Sulfur Hives and Swelling    ROS General: Denies fever, chills, night sweats, changes in weight, changes in appetite + fatigue, lightheadedness, shaky HEENT: Denies headaches, ear pain, changes in vision, rhinorrhea, sore throat CV: Denies CP, palpitations, SOB, orthopnea Pulm: Denies SOB, cough, wheezing GI: Denies abdominal pain, nausea, vomiting, diarrhea, constipation + loose stools GU: Denies dysuria,  hematuria, frequency, vaginal discharge Msk: Denies muscle cramps, joint pains Neuro: Denies weakness, numbness, tingling  +R medial forearm with numbness. Skin: Denies rashes, bruising Psych: Denies depression, anxiety, hallucinations     Objective:    Blood pressure 100/70, pulse 90, temperature 98.7 F (37.1 C), temperature source Oral, weight 189 lb (85.7 kg), last menstrual period 11/15/2013, SpO2 96 %.   Gen. Pleasant, well-nourished, in no distress, normal affect   HEENT: [place]/AT, face symmetric, no scleral icterus, PERRLA, nares patent without drainage Lungs: no accessory muscle use, CTAB, no wheezes or rales Cardiovascular: RRR, no m/r/g, no peripheral edema Abdomen: BS present, soft, NT/ND, PEG tube in place without drainage or erythema surrounding Neuro:  A&Ox3, CN II-XII intact, normal gait   Wt Readings from Last 3 Encounters:  07/27/17 189 lb (85.7 kg)  07/22/17 181 lb (82.1 kg)  07/08/17 187 lb (84.8 kg)    Lab Results  Component Value Date   WBC 4.3 07/22/2017   HGB 10.9 (L) 07/22/2017   HCT 32.0 (L) 07/22/2017   PLT 167 07/22/2017   GLUCOSE 101 (H) 07/22/2017   ALT 52 05/15/2017   AST 62 (H) 05/15/2017   NA 146 (H) 07/22/2017  K 4.0 07/22/2017   CL 109 07/22/2017   CREATININE 0.70 07/22/2017   BUN 13 07/22/2017   CO2 28 07/22/2017   TSH 1.614 05/13/2017   INR 0.95 07/22/2017   HGBA1C 5.6 06/18/2015    Assessment/Plan:  Other fatigue  -FSBS in clinic 98 -UA normal in clinic, SG 1.020.  UA in ED on 07/22/17 with small leuks.  -will send for UCx -will repeat CBC as Hgb was 10.9 on 07/22/17 - Plan: Urine Culture, POCT urinalysis dipstick, CBC (no diff), Basic metabolic panel, TSH, T4, free  Loose stools  -discussed taking immodium - Plan: CBC (no diff) -given h/o IBS , GERD, etc will place referral to GI  F/u prn  Grier Mitts, MD

## 2017-07-28 ENCOUNTER — Ambulatory Visit: Payer: BC Managed Care – PPO | Admitting: Physical Therapy

## 2017-07-28 ENCOUNTER — Encounter: Payer: Self-pay | Admitting: Physical Therapy

## 2017-07-28 DIAGNOSIS — M6281 Muscle weakness (generalized): Secondary | ICD-10-CM | POA: Diagnosis not present

## 2017-07-28 DIAGNOSIS — R279 Unspecified lack of coordination: Secondary | ICD-10-CM

## 2017-07-28 DIAGNOSIS — Z483 Aftercare following surgery for neoplasm: Secondary | ICD-10-CM

## 2017-07-28 LAB — CBC
HEMATOCRIT: 35.5 % — AB (ref 36.0–46.0)
Hemoglobin: 11.6 g/dL — ABNORMAL LOW (ref 12.0–15.0)
MCHC: 32.5 g/dL (ref 30.0–36.0)
MCV: 84.7 fl (ref 78.0–100.0)
PLATELETS: 171 10*3/uL (ref 150.0–400.0)
RBC: 4.19 Mil/uL (ref 3.87–5.11)
RDW: 16 % — ABNORMAL HIGH (ref 11.5–15.5)
WBC: 3.5 10*3/uL — AB (ref 4.0–10.5)

## 2017-07-28 LAB — T4, FREE: Free T4: 1.01 ng/dL (ref 0.60–1.60)

## 2017-07-28 LAB — BASIC METABOLIC PANEL
BUN: 17 mg/dL (ref 6–23)
CALCIUM: 8.9 mg/dL (ref 8.4–10.5)
CO2: 31 meq/L (ref 19–32)
CREATININE: 0.8 mg/dL (ref 0.40–1.20)
Chloride: 107 mEq/L (ref 96–112)
GFR: 96.81 mL/min (ref >60.00)
GLUCOSE: 95 mg/dL (ref 70–99)
Potassium: 4.3 mEq/L (ref 3.5–5.1)
Sodium: 146 mEq/L — ABNORMAL HIGH (ref 135–145)

## 2017-07-28 LAB — TSH: TSH: 1.4 u[IU]/mL (ref 0.35–4.50)

## 2017-07-28 LAB — URINE CULTURE
MICRO NUMBER:: 90817327
SPECIMEN QUALITY: ADEQUATE

## 2017-07-28 NOTE — Therapy (Signed)
[facility] [address] [place], [place], [address] Phone: [phone]   Fax:  [phone]  Progress Note Reporting Period 06/06/2017 to 07/28/2017  See note below for Objective Data and Assessment of Progress/Goals.       Physical Therapy Treatment  Patient Details  Name: Kristen [name] MRN: [id number] Date of Birth: 08-May-1965 Referring Provider: Dr. Eppie Gibson   Encounter Date: 07/28/2017  PT End of Session - 07/28/17 1219    Visit Number  10 for pelvic floor    Date for PT Re-Evaluation  08/01/17    Authorization Type  BCBS    PT Start Time  3154    PT Stop Time  1223    PT Time Calculation (min)  38 min    Activity Tolerance  Patient limited by fatigue    Behavior During Therapy  [facility] for tasks assessed/performed       Past Medical History:  Diagnosis Date  . Allergy   . Anemia    Iron deficiency  . Anxiety   . Arthritis    back, left shoulder  . Cancer ([place])   . Chicken pox   . Depression   . Diarrhea    takes Imodium daily  . GERD (gastroesophageal reflux disease)   . H/O hiatal hernia   . Headache(784.0)   . History of kidney stones    1996ish  . History of radiation therapy 11/16/16- 01/01/17   Base of Tongue/ 66 gy in 33 fractions/ Dose: 2 Gy  . Hypertension   . Migraine    none for 5 years (as of 01/13/16)  . OSA (obstructive sleep apnea) 10/13/2015   unable to get cpap, plans to get one in 2018  . Pneumonia   . Restless legs   . Scoliosis   . Shortness of breath    with exertion    Past Surgical History:  Procedure Laterality Date  . BACK SURGERY  07/21/1978  . CHOLECYSTECTOMY N/A 08/15/2013   Procedure: LAPAROSCOPIC CHOLECYSTECTOMY WITH INTRAOPERATIVE CHOLANGIOGRAM;  Surgeon: Gwenyth Ober, MD;  Location: [place];  Service: General;  Laterality: N/A;  . COLONOSCOPY N/A 01/09/2013   Procedure: COLONOSCOPY;  Surgeon: Beryle Beams, MD;  Location: [place];  Service:  Endoscopy;  Laterality: N/A;  . DIRECT LARYNGOSCOPY  07/2016   Dr. Nicolette Bang [facility]  . GASTROSTOMY TUBE PLACEMENT  09/27/2016  . HERNIA REPAIR Left 1981  . IR PATIENT EVAL TECH 0-60 MINS  12/13/2016  . IR PATIENT EVAL TECH 0-60 MINS  04/11/2017  . MODIFIED RADICAL NECK DISSECTION Left 09/27/2016   Levels 1 & 2  . PARTIAL GLOSSECTOMY Left 09/27/2016   Left hemi partial glossectomy  . SPINE SURGERY  01/20/2016   fusion  . TONSILLECTOMY    . tracheotomy  09/27/2016  . TUBAL LIGATION  06/1988    There were no vitals filed for this visit.  Subjective Assessment - 07/28/17 1151    Subjective  I feel fatiqued today.  I have been having diarrhea.     Pertinent History  Diagnosis is adenoid cystic carcinoma.  Had left hemiglossectomy and resection of base of tongue with neck dissection 09/27/16 at [facility]; had trach placed and removed.  hx of lumbar fusion in 2018    Limitations  Lifting    Patient Stated Goals  improve UE strength, control the swelling in the neck and face    Currently in Pain?  No/denies  [facility] PT Assessment - 07/28/17 0001      Strength   Right Hip Extension  3+/5    Left Hip Extension  3+/5              Outpatient Rehab from [facility]/28/2017 [facility]  Lymphedema Life Impact Scale Total Score  38.24 %           [facility] Adult PT Treatment/Exercise - 07/28/17 0001      Lumbar Exercises: Aerobic   Stationary Bike  8 min level 1      Lumbar Exercises: Machines for Strengthening   Cybex Knee Extension  bil. 15# 30x    Cybex Knee Flexion  knee flexion 30# 30x    Leg Press  seat #8 incline #4 bil. 40# 3x10      Lumbar Exercises: Standing   Other Standing Lumbar Exercises  standing hip abduction with 2#, extension with 2#, marching 1# for 15x               PT Short Term Goals - 06/22/17 1113      PT SHORT TERM GOAL #1   Title  pt will be independent with her initial HEP for pelvic floor contraction in  hookly    Time  4    Period  Weeks    Status  Achieved      PT SHORT TERM GOAL #2   Title  understand what vaginal moisturizers are and how they improve vaginal health    Time  4    Period  Weeks    Status  Achieved      PT SHORT TERM GOAL #3   Title  understand how to toilet correctly to decrease straining of the pelvic floor     Time  4    Period  Weeks    Status  Achieved        PT Long Term Goals - 07/28/17 1206      PT LONG TERM GOAL #2   Title  pelvic floor strength >/= 3/5 and hold for 10 seconds to reduce urinary leakage >/= 75%    Time  8    Period  Weeks    Status  On-going      PT LONG TERM GOAL #3   Title  ability to toilet without straining 50% of the time due to improved muscle coordination    Time  8    Period  Weeks    Status  Achieved         Head and Neck Clinic Goals - 11/30/16 1253      Patient will be able to verbalize understanding of a home exercise program for cervical range of motion, posture, and walking.    Status  Achieved      Patient will be able to verbalize understanding of proper sitting and standing posture.    Status  Achieved      Patient will be able to verbalize understanding of lymphedema risk and availability of treatment for this condition.    Status  Achieved        Plan - 07/28/17 1157    Clinical Impression Statement  Patient is very fatiqued today and was falling asleep while on the leg press.  Patient was dizzy after performing the standing exercises and had to sit down and drink a cup of water.  Patient had to rest due to her feeling dizzy and fatqued  prior to leaving therapy. Patient has improved strength in  hips for extension.  Patient reports no urinary leakage.  Patient is not having pain with intercourse.  Patient is using products for vaginal dryness. Patient has one more visit and will be discharged from pelvic floor PT next visit.     Rehab Potential  Excellent    Clinical Impairments  Affecting Rehab Potential  left spinal accessory nerve injury; remote H/O back surgery; malignant neoplasm of base of tongue with radiation treatment    PT Frequency  1x / week    PT Duration  8 weeks    PT Treatment/Interventions  Therapeutic activities;Therapeutic exercise;Patient/family education;Manual techniques;Energy conservation;Manual lymph drainage;Taping    PT Next Visit Plan  next session with pelvic floor PT discharge patient to HEP    PT Home Exercise Plan  progress as needed    Recommended Other Services  MD signed initial eval for pelvic floor PT and cancer Rehab PT    Consulted and Agree with Plan of Care  Patient       Patient will benefit from skilled therapeutic intervention in order to improve the following deficits and impairments:  Decreased strength, Decreased activity tolerance, Decreased coordination, Pain, Increased fascial restricitons  Visit Diagnosis: Muscle weakness (generalized)  Unspecified lack of coordination  Aftercare following surgery for neoplasm     Problem List Patient Active Problem List   Diagnosis Date Noted  . Acute encephalopathy 05/15/2017  . UTI (urinary tract infection) 05/15/2017  . Hypotension 02/10/2017  . 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 Malignant neoplasm of base of tongue ([place]) 10/29/2016  . 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[place]) 10/29/2016  . Headache associated with sexual activity 05/14/2016  . Tongue lesion 05/14/2016  . Loose stools 03/12/2016  . Hypocalcemia 01/22/2016  . Thrombocytopenia ([place]) 01/22/2016  . Chronic diarrhea 01/22/2016  . Chest pain   . Essential hypertension   . Orthostatic hypotension   . Spinal stenosis of lumbar region 01/20/2016    Class: Chronic  . DDD (degenerative disc disease), lumbar 01/20/2016    Class: Chronic  . Spinal stenosis of lumbar region with neurogenic claudication 01/20/2016  . Low back pain 12/22/2015  . Pre-op examination 12/22/2015   . OSA (obstructive sleep apnea) 10/13/2015  . Muscle cramp 08/01/2015  . GERD (gastroesophageal reflux disease) 07/09/2015  . Excessive daytime sleepiness 06/18/2015  . Shingles 08/28/2013  . Postop check 08/28/2013  . Cholelithiasis 07/10/2013  . Colitis 01/08/2013  . Postoperative anemia 01/08/2013  . Morbid obesity with BMI of 40.0-44.9, adult [facility]) 01/08/2013    Earlie Counts, PT 07/28/17 12:25 PM   [place] [address] [place], [place], [address] Phone: [phone]   Fax:  [phone]  Name: Kristen [name] MRN: [id number] Date of Birth: [birth year]

## 2017-07-31 ENCOUNTER — Encounter (INDEPENDENT_AMBULATORY_CARE_PROVIDER_SITE_OTHER): Payer: Self-pay | Admitting: Surgery

## 2017-08-01 ENCOUNTER — Ambulatory Visit: Payer: BC Managed Care – PPO | Admitting: Rehabilitation

## 2017-08-01 ENCOUNTER — Other Ambulatory Visit (INDEPENDENT_AMBULATORY_CARE_PROVIDER_SITE_OTHER): Payer: Self-pay | Admitting: Radiology

## 2017-08-01 ENCOUNTER — Emergency Department (HOSPITAL_COMMUNITY)
Admission: EM | Admit: 2017-08-01 | Discharge: 2017-08-02 | Disposition: A | Discharge status: Home / Self Care | Payer: BC Managed Care – PPO | Attending: Emergency Medicine | Admitting: Emergency Medicine

## 2017-08-01 ENCOUNTER — Encounter: Payer: Self-pay | Admitting: Physical Therapy

## 2017-08-01 ENCOUNTER — Ambulatory Visit: Payer: BC Managed Care – PPO | Admitting: Physical Therapy

## 2017-08-01 ENCOUNTER — Emergency Department (HOSPITAL_COMMUNITY): Payer: BC Managed Care – PPO

## 2017-08-01 ENCOUNTER — Encounter (INDEPENDENT_AMBULATORY_CARE_PROVIDER_SITE_OTHER): Payer: Self-pay | Admitting: Specialist

## 2017-08-01 ENCOUNTER — Telehealth: Payer: Self-pay | Admitting: Family Medicine

## 2017-08-01 ENCOUNTER — Other Ambulatory Visit: Payer: Self-pay

## 2017-08-01 ENCOUNTER — Encounter (HOSPITAL_COMMUNITY): Payer: Self-pay | Admitting: Radiology

## 2017-08-01 DIAGNOSIS — N390 Urinary tract infection, site not specified: Secondary | ICD-10-CM | POA: Diagnosis not present

## 2017-08-01 DIAGNOSIS — R279 Unspecified lack of coordination: Secondary | ICD-10-CM

## 2017-08-01 DIAGNOSIS — R200 Anesthesia of skin: Secondary | ICD-10-CM

## 2017-08-01 DIAGNOSIS — R531 Weakness: Secondary | ICD-10-CM | POA: Insufficient documentation

## 2017-08-01 DIAGNOSIS — Z79899 Other long term (current) drug therapy: Secondary | ICD-10-CM | POA: Diagnosis not present

## 2017-08-01 DIAGNOSIS — Z7982 Long term (current) use of aspirin: Secondary | ICD-10-CM | POA: Diagnosis not present

## 2017-08-01 DIAGNOSIS — T1490XA Injury, unspecified, initial encounter: Secondary | ICD-10-CM

## 2017-08-01 DIAGNOSIS — M6281 Muscle weakness (generalized): Secondary | ICD-10-CM

## 2017-08-01 DIAGNOSIS — Z483 Aftercare following surgery for neoplasm: Secondary | ICD-10-CM

## 2017-08-01 DIAGNOSIS — I10 Essential (primary) hypertension: Secondary | ICD-10-CM | POA: Insufficient documentation

## 2017-08-01 LAB — URINALYSIS, ROUTINE W REFLEX MICROSCOPIC
Bilirubin Urine: NEGATIVE
GLUCOSE, UA: NEGATIVE mg/dL
HGB URINE DIPSTICK: NEGATIVE
Ketones, ur: NEGATIVE mg/dL
NITRITE: POSITIVE — AB
PH: 7 (ref 5.0–8.0)
Protein, ur: NEGATIVE mg/dL
Specific Gravity, Urine: 1.006 (ref 1.005–1.030)

## 2017-08-01 MED ORDER — CEFTRIAXONE SODIUM 1 G IJ SOLR
500.0000 mg | Freq: Once | INTRAMUSCULAR | Status: AC
  Administered 2017-08-01: 500 mg via INTRAMUSCULAR

## 2017-08-01 MED ORDER — IOPAMIDOL (ISOVUE-370) INJECTION 76%
INTRAVENOUS | Status: AC
  Filled 2017-08-01: qty 50

## 2017-08-01 MED ORDER — TRAMADOL HCL 50 MG PO TABS: 50.0000 mg | ORAL_TABLET | Freq: Four times a day (QID) | ORAL | 0 refills | Status: DC | PRN

## 2017-08-01 MED ORDER — LIDOCAINE HCL (PF) 1 % IJ SOLN: 2.0000 mL | Freq: Once | INTRAMUSCULAR | Status: DC

## 2017-08-01 MED ORDER — IOPAMIDOL (ISOVUE-370) INJECTION 76%
50.0000 mL | Freq: Once | INTRAVENOUS | Status: AC | PRN
  Administered 2017-08-01: 50 mL via INTRAVENOUS

## 2017-08-01 MED ORDER — LIDOCAINE HCL (PF) 1 % IJ SOLN
5.0000 mL | Freq: Once | INTRAMUSCULAR | Status: AC
  Administered 2017-08-01: 5 mL via INTRADERMAL
  Filled 2017-08-01: qty 5

## 2017-08-01 NOTE — ED Provider Notes (Signed)
[facility] EMERGENCY DEPARTMENT Provider Note   CSN: [id number] Arrival date & time: 08/01/17  1531     History   Chief Complaint Chief Complaint  Patient presents with  . Weakness    HPI Kristen [name] is a 52 y.o. female.  HPI 52 year old female comes in with chief complaint of weakness. Patient has remote history of throat cancer.  Over the past few days she has been having intermittent episodes of dizziness followed by profound weakness and sleepiness.  Patient was seen in the ER recently and she had a MRI which was negative for any acute process along with normal-appearing labs, and she was advised to follow-up with her PCP.  Patient has seen her PCP, and further work-up including TSH was normal, and today she went for physical therapy where she was again noted to be very weak and sent to the ER.  In addition to dizziness, patient also has been complaining of 3 weeks of numbness to her right upper extremity along with bilateral grip weakness.  The numbness is more constant, unlike the dizziness and weakness.  Patient is on pain medication, however she says that she has not taken any extra doses and she has been on the same medication for a long period of time.   Past Medical History:  Diagnosis Date  . Allergy   . Anemia    Iron deficiency  . Anxiety   . Arthritis    back, left shoulder  . Cancer ([place])   . Chicken pox   . Depression   . Diarrhea    takes Imodium daily  . GERD (gastroesophageal reflux disease)   . H/O hiatal hernia   . Headache(784.0)   . History of kidney stones    1996ish  . History of radiation therapy 11/16/16- 01/01/17   Base of Tongue/ 66 gy in 33 fractions/ Dose: 2 Gy  . Hypertension   . Migraine    none for 5 years (as of 01/13/16)  . OSA (obstructive sleep apnea) 10/13/2015   unable to get cpap, plans to get one in 2018  . Pneumonia   . Restless legs   . Scoliosis   . Shortness of breath    with exertion     Patient Active Problem List   Diagnosis Date Noted  . Acute encephalopathy 05/15/2017  . UTI (urinary tract infection) 05/15/2017  . Hypotension 02/10/2017  . 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 Malignant neoplasm of base of tongue ([place]) 10/29/2016  . 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[place]) 10/29/2016  . Headache associated with sexual activity 05/14/2016  . Tongue lesion 05/14/2016  . Loose stools 03/12/2016  . Hypocalcemia 01/22/2016  . Thrombocytopenia ([place]) 01/22/2016  . Chronic diarrhea 01/22/2016  . Chest pain   . Essential hypertension   . Orthostatic hypotension   . Spinal stenosis of lumbar region 01/20/2016    Class: Chronic  . DDD (degenerative disc disease), lumbar 01/20/2016    Class: Chronic  . Spinal stenosis of lumbar region with neurogenic claudication 01/20/2016  . Low back pain 12/22/2015  . Pre-op examination 12/22/2015  . OSA (obstructive sleep apnea) 10/13/2015  . Muscle cramp 08/01/2015  . GERD (gastroesophageal reflux disease) 07/09/2015  . Excessive daytime sleepiness 06/18/2015  . Shingles 08/28/2013  . Postop check 08/28/2013  . Cholelithiasis 07/10/2013  . Colitis 01/08/2013  . Postoperative anemia 01/08/2013  . Morbid obesity with BMI of 40.0-44.9, adult ([place]) 01/08/2013  Past Surgical History:  Procedure Laterality Date  . BACK SURGERY  07/21/1978  . CHOLECYSTECTOMY N/A 08/15/2013   Procedure: LAPAROSCOPIC CHOLECYSTECTOMY WITH INTRAOPERATIVE CHOLANGIOGRAM;  Surgeon: Gwenyth Ober, MD;  Location: [facility];  Service: General;  Laterality: N/A;  . COLONOSCOPY N/A 01/09/2013   Procedure: COLONOSCOPY;  Surgeon: Beryle Beams, MD;  Location: [facility];  Service: Endoscopy;  Laterality: N/A;  . DIRECT LARYNGOSCOPY  07/2016   Dr. Nicolette Bang [facility]  . GASTROSTOMY TUBE PLACEMENT  09/27/2016  . HERNIA REPAIR Left 1981  . IR PATIENT EVAL TECH 0-60 MINS  12/13/2016  . IR PATIENT EVAL TECH 0-60 MINS  04/11/2017  .  MODIFIED RADICAL NECK DISSECTION Left 09/27/2016   Levels 1 & 2  . PARTIAL GLOSSECTOMY Left 09/27/2016   Left hemi partial glossectomy  . SPINE SURGERY  01/20/2016   fusion  . TONSILLECTOMY    . tracheotomy  09/27/2016  . TUBAL LIGATION  06/1988     OB History   None      Home Medications    Prior to Admission medications   Medication Sig Start Date End Date Taking? Authorizing Provider  amLODipine (NORVASC) 10 MG tablet Take 1 tablet (10 mg total) daily by mouth. Patient taking differently: Place 10 mg into feeding tube daily.  11/30/16  Yes [name], MD  aspirin 81 MG chewable tablet Place 81 mg into feeding tube daily.  10/05/16  Yes [provider]  baclofen (LIORESAL) 10 MG tablet Place 10 mg into feeding tube 3 (three) times daily.  05/10/17  Yes [provider]  calcium carbonate (OSCAL) 1500 (600 Ca) MG TABS tablet 600 mg of elemental calcium 2 (two) times daily with a meal. Per tube   Yes [provider]  Eluxadoline (VIBERZI) 100 MG TABS Take 1 tablet (100 mg total) by mouth 2 (two) times daily. Patient taking differently: Place 100 mg into feeding tube 2 (two) times daily.  05/27/17  Yes [name], MD  ergocalciferol (DRISDOL) 8000 UNIT/ML drops Place 6 mLs (48,000 Units total) into feeding tube once a week. 03/04/17  Yes [name], MD  gabapentin (NEURONTIN) 300 MG capsule Take 1 capsule (300 mg total) by mouth 3 (three) times daily. Patient taking differently: Place 300 mg into feeding tube 3 (three) times daily.  05/26/17  Yes [name], MD  pantoprazole (PROTONIX) 40 MG tablet Take 1 tablet (40 mg total) daily by mouth. Patient taking differently: 40 mg daily. Per tube 11/30/16  Yes [name], MD  Potassium 99 MG TABS 1 tablet (99 mg total) by Gastrostomy Tube route daily. Patient taking differently: Place 1 tablet into feeding tube daily.  05/26/17  Yes [name], MD  SUMAtriptan (IMITREX) 100 MG tablet  Take 0.5-1 tablet by mouth at the onset of migraine and may repeat in 2 hours if headache persists or recurs. Patient taking differently: Place 100 mg into feeding tube every 2 (two) hours as needed for migraine or headache.  05/14/16  Yes Golden Circle, FNP  diclofenac (VOLTAREN) 75 MG EC tablet TAKE ONE TABLET BY MOUTH TWICE DAILY WITH FOOD AS NEEDED FOR PAIN Patient not taking: Reported on 08/01/2017 11/22/16   Jessy Oto, MD  Eluxadoline (VIBERZI) 100 MG TABS Take 1 tablet (100 mg total) by mouth 2 (two) times daily. Patient not taking: Reported on 08/01/2017 03/23/17   [name], MD  LORazepam (ATIVAN) 0.5 MG tablet Take 1 tab PRN nausea. Do  not take more than 3 pills daily. Patient not taking: Reported on 08/01/2017 12/31/16   Eppie Gibson, MD  traMADol (ULTRAM) 50 MG tablet Take 1 tablet (50 mg total) by mouth every 6 (six) hours as needed. for pain Patient taking differently: Place 50 mg into feeding tube every 6 (six) hours as needed. for pain 08/01/17   Jessy Oto, MD    Family History Family History  Problem Relation Age of Onset  . Heart disease Father   . Heart attack Father   . Hypertension Mother   . Arthritis Mother   . Diabetes Maternal Grandmother   . Diabetes Paternal Grandmother   . Diabetes Maternal Uncle     Social History Social History   Tobacco Use  . Smoking status: Never Smoker  . Smokeless tobacco: Never Used  Substance Use Topics  . Alcohol use: Yes    Alcohol/week: 4.2 oz    Types: 7 Cans of beer per week    Comment: 1 beer daily  . Drug use: No     Allergies   Other and Sulfur   Review of Systems Review of Systems  Constitutional: Positive for activity change and fatigue.  Respiratory: Negative for shortness of breath.   Cardiovascular: Negative for chest pain.  Gastrointestinal: Negative for abdominal pain, nausea and vomiting.  Allergic/Immunologic: Negative for immunocompromised state.  Neurological: Positive for  dizziness, weakness, light-headedness, numbness and headaches. Negative for tremors, seizures, syncope, facial asymmetry and speech difficulty.  Hematological: Does not bruise/bleed easily.  Psychiatric/Behavioral: Positive for sleep disturbance. Negative for confusion.     Physical Exam Updated Vital Signs BP 107/72   Pulse (!) 51   Temp 97.7 F (36.5 C) (Oral)   Resp 13   Ht 5' 9"  (1.753 m)   Wt 85.7 kg (189 lb)   LMP 11/15/2013   SpO2 100%   BMI 27.91 kg/m   Physical Exam  Constitutional: She is oriented to person, place, and time. She appears well-developed.  HENT:  Head: Normocephalic and atraumatic.  Eyes: Pupils are equal, round, and reactive to light. Conjunctivae and EOM are normal.  Neck: Normal range of motion. Neck supple.  Cardiovascular: Normal rate, regular rhythm and normal heart sounds.  Pulmonary/Chest: Effort normal and breath sounds normal. No respiratory distress.  Abdominal: Soft. Bowel sounds are normal. She exhibits no distension. There is no tenderness. There is no rebound and no guarding.  Neurological: She is alert and oriented to person, place, and time. No cranial nerve deficit. Coordination normal.  Subjective numbness to the right upper extremity, Grip strength is normal, cerebellar exam reveals no dysmetria  Skin: Skin is warm and dry.  Nursing note and vitals reviewed.    ED Treatments / Results  Labs (all labs ordered are listed, but only abnormal results are displayed) Labs Reviewed  URINALYSIS, ROUTINE W REFLEX MICROSCOPIC - Abnormal; Notable for the following components:      Result Value   Nitrite POSITIVE (*)    Leukocytes, UA LARGE (*)    Bacteria, UA MANY (*)    All other components within normal limits  URINE CULTURE    EKG None  Radiology No results found.  Procedures Procedures (including critical care time)  Medications Ordered in ED Medications  iopamidol (ISOVUE-370) 76 % injection (has no administration in  time range)  cefTRIAXone (ROCEPHIN) injection 500 mg (500 mg Intramuscular Given 08/01/17 2143)  lidocaine (PF) (XYLOCAINE) 1 % injection 5 mL (5 mLs Intradermal Given 08/01/17 2143)  iopamidol (  ISOVUE-370) 76 % injection 50 mL (50 mLs Intravenous Contrast Given 08/01/17 2120)     Initial Impression / Assessment and Plan / ED Course  I have reviewed the triage vital signs and the nursing notes.  Pertinent labs & imaging results that were available during my care of the patient were reviewed by me and considered in my medical decision making (see chart for details).     52 year old female comes in with chief complaint of weakness and dizziness.  Her symptoms have been going on for the last several days.  Patient also has been having 3 weeks of right-sided subjective numbness.  Patient has been seen in the ER prior to today, and had an MRI which was negative.  Patient has history of throat cancer.  We will get a CT angiogram head and neck to ensure there is no posterior circulation pathology including dissection and stenosis.  We also would make sure that there is no metastatic process to the spine.  Additionally, UA has been ordered.  Patient just had basic work-up done within the last month, and no repeat electrolytes are needed.  Anticipate discharge, we will advised neurology follow-up -patient is still not call neurology for an appointment from her previous visit.  Final Clinical Impressions(s) / ED Diagnoses   Final diagnoses:  Lower urinary tract infectious disease  Generalized weakness  Numbness    ED Discharge Orders    None       Varney Biles, MD 08/01/17 2237

## 2017-08-01 NOTE — ED Triage Notes (Signed)
Per ems they were called out to dr's office after patient had physical therapy and pt stated she had been feeling very weak. Numbness in her right hand and right forearm. GENERALIZED WEAKNESS ALL OVER. Pt was negative orthostatic vitals per ems. PT IS ALERT ORIENTED X 4. Hx tongue and throat cancer. cbg 95, 110/70

## 2017-08-01 NOTE — ED Notes (Signed)
Pt is very sleepy and you have to go in and wake her but she will wake and answer any question and follow all commands.

## 2017-08-01 NOTE — Telephone Encounter (Signed)
Called to [facility]

## 2017-08-01 NOTE — ED Notes (Signed)
Patient transported to CT 

## 2017-08-01 NOTE — Telephone Encounter (Signed)
Pt walked into office this afternoon after her visit with PT Pt was driven by her PT at [facility] over to our office.  Pt was assisted into the wheelchair by Denna Haggard and was taken to an exam room and was triaged.  Pt c/o severe lightheadedness and feeling weak. Pt states that this is something that has been happening for quite some time now and happens around 11am every day. Pt notes that her symptoms start with a "vibration sensation" in her head and radiates down through her body. Pt states that she has been having Right hand and arm weakness/numbness x 2 weeks and is having hip and leg numbness as well on the same side. Pt denies N/V and chest pain. No numbness to face. Pt also having decreased grip in her hands--she dropped her phone in the lobby when walking to the wheelchair. Pt states that this morning she checked her BS and it was 113 at 6am. When the symptoms started she checked her BS again and it was 83 at 9:30am.  Spoke with Dr Volanda Napoleon, advised ED visit.  Discussed rec's with patient by myself and Leandra Kern RN. Pt agreed to ED visit with EMS transport.

## 2017-08-01 NOTE — Discharge Instructions (Signed)
We saw in the ER for your dizziness and right-sided numbness along with sleepiness and weakness.  We noted that you have a urinary tract infection.  Please start taking the antibiotics prescribed.  For your numbness and weakness in arms, please see the neurologist.

## 2017-08-01 NOTE — Therapy (Addendum)
[facility] [address], [place], [place], [address] Phone: [phone]   Fax:  [phone]  Physical Therapy Treatment  Patient Details  Name: Kristen [name] MRN: [id number] Date of Birth: 03/09/1965 Referring Provider: Dr. Eppie Gibson   Encounter Date: 08/01/2017  PT End of Session - 08/01/17 1308    Visit Number  11    Date for PT Re-Evaluation  08/01/17    Authorization Type  [employer]    PT Start Time  1245 came late    PT Stop Time  1315    PT Time Calculation (min)  30 min    Activity Tolerance  Patient limited by fatigue    Behavior During Therapy  [facility] for tasks assessed/performed       Past Medical History:  Diagnosis Date  . Allergy   . Anemia    Iron deficiency  . Anxiety   . Arthritis    back, left shoulder  . Cancer ([place])   . Chicken pox   . Depression   . Diarrhea    takes Imodium daily  . GERD (gastroesophageal reflux disease)   . H/O hiatal hernia   . Headache(784.0)   . History of kidney stones    1996ish  . History of radiation therapy 11/16/16- 01/01/17   Base of Tongue/ 66 gy in 33 fractions/ Dose: 2 Gy  . Hypertension   . Migraine    none for 5 years (as of 01/13/16)  . OSA (obstructive sleep apnea) 10/13/2015   unable to get cpap, plans to get one in 2018  . Pneumonia   . Restless legs   . Scoliosis   . Shortness of breath    with exertion    Past Surgical History:  Procedure Laterality Date  . BACK SURGERY  07/21/1978  . CHOLECYSTECTOMY N/A 08/15/2013   Procedure: LAPAROSCOPIC CHOLECYSTECTOMY WITH INTRAOPERATIVE CHOLANGIOGRAM;  Surgeon: Gwenyth Ober, MD;  Location: [facility];  Service: General;  Laterality: N/A;  . COLONOSCOPY N/A 01/09/2013   Procedure: COLONOSCOPY;  Surgeon: Beryle Beams, MD;  Location: [facility];  Service: Endoscopy;  Laterality: N/A;  . DIRECT LARYNGOSCOPY  07/2016   Dr. Nicolette Bang [facility]  . GASTROSTOMY TUBE PLACEMENT  09/27/2016  . HERNIA REPAIR Left  1981  . IR PATIENT EVAL TECH 0-60 MINS  12/13/2016  . IR PATIENT EVAL TECH 0-60 MINS  04/11/2017  . MODIFIED RADICAL NECK DISSECTION Left 09/27/2016   Levels 1 & 2  . PARTIAL GLOSSECTOMY Left 09/27/2016   Left hemi partial glossectomy  . SPINE SURGERY  01/20/2016   fusion  . TONSILLECTOMY    . tracheotomy  09/27/2016  . TUBAL LIGATION  06/1988    There were no vitals filed for this visit.  Subjective Assessment - 08/01/17 1247    Subjective  I am still light headed and sleepy but it is due to pain  in right hip.      Pertinent History  Diagnosis is adenoid cystic carcinoma.  Had left hemiglossectomy and resection of base of tongue with neck dissection 09/27/16 at [facility]; had trach placed and removed.  hx of lumbar fusion in 2018    Limitations  Lifting    Patient Stated Goals  improve UE strength, control the swelling in the neck and face    Currently in Pain?  Yes    Pain Score  7     Pain Location  Hip    Pain Orientation  Right  Pain Descriptors / Indicators  Aching    Pain Type  Acute pain    Pain Onset  1 to 4 weeks ago    Pain Frequency  Intermittent    Aggravating Factors   sitting for extended periods of time    Pain Relieving Factors  stretching away from that side    Multiple Pain Sites  No         [facility] PT Assessment - 08/01/17 0001      Assessment   Medical Diagnosis  adenoid cystic carcinoma tongue    Referring Provider  Dr. 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    Leisure  no regular exercise      Cognition   Overall Cognitive Status  Within Functional Limits for tasks assessed      Posture/Postural Control   Posture/Postural Control  No significant limitations      ROM / Strength   AROM / PROM / Strength  Strength      Strength   Right Hip Flexion  4/5    Right Hip Extension  4/5    Right Hip External Rotation   4/5    Right Hip Internal Rotation  4/5    Right Hip ABduction  4/5    Right Hip ADduction  4/5    Left Hip Extension  4/5    Left Hip ABduction  4/5    Left Hip ADduction  4/5      Transfers   Transfers  Independent with all Transfers      Ambulation/Gait   Ambulation/Gait Assistance  7: Independent              [facility]  Lymphedema Life Impact Scale Total Score  38.24 %           [facility] Adult PT Treatment/Exercise - 08/01/17 0001      Lumbar Exercises: Aerobic   Stationary Bike  8 min level 1      Lumbar Exercises: Machines for Strengthening   Leg Press  seat #8 incline #4 bil. 40# 3x10      Lumbar Exercises: Standing   Other Standing Lumbar Exercises  standing hip abduction with 2#, extension with 2#, marching 1# for 15x               PT Short Term Goals - 06/22/17 1113      PT SHORT TERM GOAL #1   Title  pt will be independent with her initial HEP for pelvic floor contraction in hookly    Time  4    Period  Weeks    Status  Achieved      PT SHORT TERM GOAL #2   Title  understand what vaginal moisturizers are and how they improve vaginal health  Time  4    Period  Weeks    Status  Achieved      PT SHORT TERM GOAL #3   Title  understand how to toilet correctly to decrease straining of the pelvic floor     Time  4    Period  Weeks    Status  Achieved        PT Long Term Goals - 08/01/17 1253      PT LONG TERM GOAL #1   Title  Pt will be independent with self MLD and  self compression for head and neck lymphedema    Time  4    Period  Weeks    Status  Achieved      PT LONG TERM GOAL #2   Title  pelvic floor strength >/= 3/5 and hold for 10 seconds to reduce urinary leakage >/= 75%    Time  8    Period  Weeks    Status  Achieved      PT LONG TERM GOAL #3   Title  ability to toilet without straining 50% of the time due to improved muscle coordination    Time  8    Period  Weeks    Status  Achieved      PT LONG TERM GOAL #4   Title  ability to get to the commode without leakage urine due to improve pelvic floor coordination    Time  8    Period  Weeks    Status  Achieved      PT LONG TERM GOAL #5   Title  pain with penile penetration decreased >/= 50% due to improve tissue mobility and moisture of the vagina    Time  8    Period  Weeks    Status  Achieved         Head and Neck Clinic Goals - 11/30/16 1253      Patient will be able to verbalize understanding of a home exercise program for cervical range of motion, posture, and walking.    Status  Achieved      Patient will be able to verbalize understanding of proper sitting and standing posture.    Status  Achieved      Patient will be able to verbalize understanding of lymphedema risk and availability of treatment for this condition.    Status  Achieved        Plan - 08/01/17 1311    Clinical Impression Statement  Patient has met her goals for pelvic floor rehab.  Patient is very fatique and needs cues to wake up.  Patient continues to feel dizzy.  Patient has some increase strength for hips.  Patient reports no urinary leakage. Patient has no pain with intercourse.     Rehab Potential  Excellent    Clinical Impairments Affecting Rehab Potential  left spinal accessory nerve injury; remote H/O back surgery; malignant neoplasm of base of tongue with radiation treatment    PT Treatment/Interventions  Therapeutic activities;Therapeutic exercise;Patient/family  education;Manual techniques;Energy conservation;Manual lymph drainage;Taping    PT Next Visit Plan  Discharge from pelvic floor PT today    PT Home Exercise Plan  Current HEP    Consulted and Agree with Plan of Care  Patient       Patient will benefit from skilled therapeutic intervention in order to improve the following deficits and impairments:  Decreased strength, Decreased activity tolerance, Decreased coordination, Pain, Increased fascial restricitons  Visit Diagnosis: Muscle weakness (  generalized)  Unspecified lack of coordination  Aftercare following surgery for neoplasm     Problem List Patient Active Problem List   Diagnosis Date Noted  . Acute encephalopathy 05/15/2017  . UTI (urinary tract infection) 05/15/2017  . Hypotension 02/10/2017  . 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 Malignant neoplasm of base of tongue ([place]) 10/29/2016  . 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 Headache associated with sexual activity 05/14/2016  . Tongue lesion 05/14/2016  . Loose stools 03/12/2016  . Hypocalcemia 01/22/2016  . Thrombocytopenia ([place]) 01/22/2016  . Chronic diarrhea 01/22/2016  . Chest pain   . Essential hypertension   . Orthostatic hypotension   . Spinal stenosis of lumbar region 01/20/2016    Class: Chronic  . DDD (degenerative disc disease), lumbar 01/20/2016    Class: Chronic  . Spinal stenosis of lumbar region with neurogenic claudication 01/20/2016  . Low back pain 12/22/2015  . Pre-op examination 12/22/2015  . OSA (obstructive sleep apnea) 10/13/2015  . Muscle cramp 08/01/2015  . GERD (gastroesophageal reflux disease) 07/09/2015  . Excessive daytime sleepiness 06/18/2015  . Shingles 08/28/2013  . Postop check 08/28/2013  . Cholelithiasis 07/10/2013  . Colitis 01/08/2013  . Postoperative anemia 01/08/2013  . Morbid obesity with BMI of 40.0-44.9, adult ([place]) 01/08/2013    Earlie Counts, PT [facility]  [facility] [address], [place], [place], [address] Phone: [phone]   Fax:  [phone]  Name: Kristen [name] MRN: [id number] Date of Birth: 20-Dec-1965 PHYSICAL THERAPY DISCHARGE SUMMARY  Visits from Start of Care: 11  Current functional level related to goals / functional outcomes: See above  Remaining deficits: See above.  Patient in not able to finish her exercise program the past 2 sessions due to fatigue and feeling dizzy.    Education / Equipment: HEP Plan: Patient agrees to discharge.  Patient goals were met. Patient is being discharged due to meeting the stated rehab goals.  Thank you for the referral. Earlie Counts, PT 08/01/17 1:26 PM  ?????

## 2017-08-02 ENCOUNTER — Telehealth: Payer: Self-pay | Admitting: Internal Medicine

## 2017-08-02 MED ORDER — CEPHALEXIN 500 MG PO CAPS: 500.0000 mg | ORAL_CAPSULE | Freq: Two times a day (BID) | ORAL | 0 refills | Status: DC

## 2017-08-02 NOTE — Telephone Encounter (Signed)
Pt called back and said she would still come in for an OV with CY tomorrow, 7/17 due to her other appt being at 3:15 in the afternoon, not in the morning like she thought.  Pt's appt was remade with CY 7/17 at 10:30. Nothing further needed.

## 2017-08-02 NOTE — ED Provider Notes (Signed)
Care assumed from previous provider Dr. Kathrynn Humble. Please see their note for further details to include full history and physical. To summarize in short pt is a 52 year old female resents to the ED for evaluation of weakness with remote history of throat cancer.. Case discussed, plan agreed upon.   At time of care handoff was awaiting CTA of the neck.  This has returned and is reassuring.  No signs of posterior occlusion or metastasis.  Patient does have a UTI with UA.  Patient started on antibiotics.  Will need follow-up with neurology and primary care doctor.  Discussed return precautions with patient.   Pt is hemodynamically stable, in NAD, & able to ambulate in the ED. Evaluation does not show pathology that would require ongoing emergent intervention or inpatient treatment. I explained the diagnosis to the patient. Pain has been managed & has no complaints prior to dc. Pt is comfortable with above plan and is stable for discharge at this time. All questions were answered prior to disposition. Strict return precautions for f/u to the ED were discussed. Encouraged follow up with PCP.        Doristine Devoid, PA-C 08/02/17 0006    Varney Biles, MD 08/02/17 [phone]

## 2017-08-02 NOTE — Telephone Encounter (Signed)
Attempted to call pt. I did not receive an answer. I have left a message for pt to return our call.   Pt can be scheduled with an APP.

## 2017-08-02 NOTE — Telephone Encounter (Signed)
Spoke with patient-is aware of that her apt is tomorrow at 10:30am-not today. Pt will be able to make it to her apt tomorrow. No need to [facility].

## 2017-08-03 ENCOUNTER — Ambulatory Visit: Payer: BC Managed Care – PPO

## 2017-08-03 ENCOUNTER — Ambulatory Visit (INDEPENDENT_AMBULATORY_CARE_PROVIDER_SITE_OTHER): Payer: BC Managed Care – PPO | Admitting: Internal Medicine

## 2017-08-03 ENCOUNTER — Ambulatory Visit: Payer: BC Managed Care – PPO | Admitting: Internal Medicine

## 2017-08-03 ENCOUNTER — Encounter: Payer: Self-pay | Admitting: Internal Medicine

## 2017-08-03 ENCOUNTER — Telehealth: Payer: Self-pay | Admitting: Internal Medicine

## 2017-08-03 VITALS — BP 128/72 | HR 64 | Ht 69.0 in | Wt 185.6 lb

## 2017-08-03 DIAGNOSIS — G4710 Hypersomnia, unspecified: Secondary | ICD-10-CM

## 2017-08-03 DIAGNOSIS — Z483 Aftercare following surgery for neoplasm: Secondary | ICD-10-CM

## 2017-08-03 DIAGNOSIS — Z6841 Body Mass Index (BMI) 40.0 and over, adult: Secondary | ICD-10-CM | POA: Diagnosis not present

## 2017-08-03 DIAGNOSIS — M6281 Muscle weakness (generalized): Secondary | ICD-10-CM | POA: Diagnosis not present

## 2017-08-03 DIAGNOSIS — M25611 Stiffness of right shoulder, not elsewhere classified: Secondary | ICD-10-CM

## 2017-08-03 DIAGNOSIS — M25612 Stiffness of left shoulder, not elsewhere classified: Secondary | ICD-10-CM

## 2017-08-03 DIAGNOSIS — I890 Lymphedema, not elsewhere classified: Secondary | ICD-10-CM

## 2017-08-03 DIAGNOSIS — M542 Cervicalgia: Secondary | ICD-10-CM

## 2017-08-03 MED ORDER — AMPHETAMINE-DEXTROAMPHETAMINE 10 MG PO TABS: ORAL_TABLET | ORAL | 0 refills | Status: DC

## 2017-08-03 NOTE — Progress Notes (Signed)
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  Had seen Dr. Halford Chessman in 2017 with symptoms consistent with OSA and was to have a home sleep test. 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 Has G-tube. Epworth 10 Medical problem list includes degenerative disc disease, lumbar spinal stenosis, chronic diarrhea/colitis, HBP, GERD,  Weight now 194 pounds, room air saturation 99% at rest ----Sleep follow up-seen by [facility] in 08-2015; had sleep study and was not able to get set up with CPAP.  Over the last 2 years, starting before treatment for her cancer in 2018, she has been aware of excessive daytime sleepiness.  Working as a Air cabin crew on a schoolbus.  Some days she will fall asleep while riding and is worried about her job.  Feels that she sleeps well.  Has been told that she snores but not witnessed apnea.  Occasional naps.  No caffeine except an occasional Pepsi. Bedtime between 10 and 11 PM, may wake once before up at 5 AM. Recorded weight loss 73 pounds since treatment for mouth cancer in 2018.  Can eat some by mouth but most nutrition is by G-tube. CXR 02/10/17 Low lung volumes, bibasilar atelectasis.  Mild cardiomegaly.  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  Does not have sleep apnea.  May eventually benefit from NPSG/ MSLT.  For now we will see if we can meet her needs with an appropriate stimulant, used with care.  ROS-see HPI   + = positive Constitutional:    weight loss, night sweats, fevers, chills, fatigue, lassitude. HEENT:    headaches, difficulty swallowing, tooth/dental problems, sore throat,       sneezing, itching, ear ache, nasal congestion, post nasal drip, snoring CV:    chest pain, orthopnea, PND, swelling in lower extremities, anasarca,                                  dizziness, palpitations Resp:   shortness of breath with exertion or at rest.                productive cough,   non-productive cough, coughing up of blood.              change in color of mucus.  wheezing.   Skin:    rash or lesions. GI:  No-   heartburn, indigestion, abdominal pain, nausea, vomiting, diarrhea,                 change in bowel habits, loss of appetite GU: dysuria, change in color of urine, no urgency or frequency.   flank pain. MS:   joint pain, stiffness, decreased range of motion, back pain. Neuro-     nothing unusual Psych:  change in mood or affect.  depression or anxiety.   memory loss.  OBJ- Physical Exam General- Alert, Oriented, Affect-appropriate,  Distress- none acute,  Skin- rash-none, lesions- none, excoriation- none Lymphadenopathy- none Head- atraumatic            Eyes- Gross vision intact, PERRLA, conjunctivae and secretions clear            Ears- Hearing, canals-normal            Nose- Clear, no-Septal dev, mucus, polyps, erosion, perforation             Throat- Mallampati III-IV , mucosa clear , drainage- none, tonsils- atrophic, + edentulous                       +speech is fairly clear.  I did not see obvious surgical changes. Neck- flexible , trachea  midline, no stridor , thyroid nl, carotid no bruit Chest - symmetrical excursion , unlabored           Heart/CV- RRR , no murmur , no gallop  , no rub, nl s1 s2                           - JVD- none , edema- none, stasis changes- none, varices- none           Lung- clear to P&A, wheeze- none, cough- none , dullness-none, rub- none           Chest wall- + postsurgical scars Abd-   + Gastric feeding tube Br/ Gen/ Rectal- Not done, not indicated Extrem- cyanosis- none, clubbing, none, atrophy- none, strength- nl Neuro- grossly intact to observation

## 2017-08-03 NOTE — Assessment & Plan Note (Signed)
She has lost 80 pounds since cancer therapy, and another pounds since last here.  Continues feeding with G-tube.

## 2017-08-03 NOTE — Therapy (Signed)
[place], [place], [address] Phone: [phone]   Fax:  [phone]  Physical Therapy Treatment  Patient Details  Name: Kristen [name] MRN: [id number] Date of Birth: 06/27/1965 Referring Provider: Dr. Eppie Gibson   Encounter Date: 08/03/2017  PT End of Session - 08/03/17 1602    Visit Number  12    Number of Visits  10    Date for PT Re-Evaluation  08/01/17    Authorization Type  BC[employer]    PT Start Time  1517    PT Stop Time  1601    PT Time Calculation (min)  44 min    Activity Tolerance  Patient tolerated treatment well    Behavior During Therapy  [facility] for tasks assessed/performed       Past Medical History:  Diagnosis Date  . Allergy   . Anemia    Iron deficiency  . Anxiety   . Arthritis    back, left shoulder  . Cancer ([place])   . Chicken pox   . Depression   . Diarrhea    takes Imodium daily  . GERD (gastroesophageal reflux disease)   . H/O hiatal hernia   . Headache(784.0)   . History of kidney stones    1996ish  . History of radiation therapy 11/16/16- 01/01/17   Base of Tongue/ 66 gy in 33 fractions/ Dose: 2 Gy  . Hypertension   . Migraine    none for 5 years (as of 01/13/16)  . OSA (obstructive sleep apnea) 10/13/2015   unable to get cpap, plans to get one in 2018  . Pneumonia   . Restless legs   . Scoliosis   . Shortness of breath    with exertion    Past Surgical History:  Procedure Laterality Date  . BACK SURGERY  07/21/1978  . CHOLECYSTECTOMY N/A 08/15/2013   Procedure: LAPAROSCOPIC CHOLECYSTECTOMY WITH INTRAOPERATIVE CHOLANGIOGRAM;  Surgeon: Gwenyth Ober, MD;  Location: [facility];  Service: General;  Laterality: N/A;  . COLONOSCOPY N/A 01/09/2013   Procedure: COLONOSCOPY;  Surgeon: Beryle Beams, MD;  Location: [facility];  Service: Endoscopy;  Laterality: N/A;  . DIRECT LARYNGOSCOPY  07/2016   Dr. Nicolette Bang [facility]  . GASTROSTOMY TUBE PLACEMENT  09/27/2016  .  HERNIA REPAIR Left 1981  . IR PATIENT EVAL TECH 0-60 MINS  12/13/2016  . IR PATIENT EVAL TECH 0-60 MINS  04/11/2017  . MODIFIED RADICAL NECK DISSECTION Left 09/27/2016   Levels 1 & 2  . PARTIAL GLOSSECTOMY Left 09/27/2016   Left hemi partial glossectomy  . SPINE SURGERY  01/20/2016   fusion  . TONSILLECTOMY    . tracheotomy  09/27/2016  . TUBAL LIGATION  06/1988    There were no vitals filed for this visit.  Subjective Assessment - 08/03/17 1523    Subjective  Kristen [name] took me to the doctor Monday because I was feeling so bad. When I got to the doctor they decided to call an ambulance and sent me to the hospital. I have a UTI and they said it got to far gone and that's what caused all my symptoms (lightheaded, tired and HA). i can already tell I'm feeling better from starting the antibiotic. My mom is driving me around today because I even though I feel better I'm still a little out of it.    Pertinent History  Diagnosis is adenoid cystic carcinoma.  Had left hemiglossectomy and resection of base of tongue with neck dissection 09/27/16  at [facility]; had trach placed and removed.  hx of lumbar fusion in 2018    Patient Stated Goals  improve UE strength, control the swelling in the neck and face    Currently in Pain?  Yes    Pain Score  8     Pain Location  Hip    Pain Orientation  Right;Left    Pain Descriptors / Indicators  Aching feels like a toothache    Pain Type  Acute pain    Pain Onset  In the past 7 days    Pain Frequency  Intermittent    Aggravating Factors   from UTI    Pain Relieving Factors  antibiotic            LYMPHEDEMA/ONCOLOGY QUESTIONNAIRE - 08/03/17 1536      Head and Neck   Right Corner of mouth to where ear lobe meets face  9 cm    Left Corner of mouth to where ear lobe meets face  9.9 cm    4 cm superior to sternal notch around neck  37.6 cm    6 cm superior to sternal notch around neck  36.9 cm    8 cm superior to sternal notch around neck  36.8 cm            Outpatient Rehab from 06/28/2017 in [place]  Lymphedema Life Impact Scale Total Score  38.24 %           [facility] Adult PT Treatment/Exercise - 08/03/17 0001      Self-Care   Other Self-Care Comments   Vrbally reviewed HEP and pt reports feeling independent. Also reviewed compression garment options and reprinted handout from [facility] of head/neck compression garment for pt to order and answered her questions regarding this.       Manual Therapy   Manual Lymphatic Drainage (MLD)  In supine HOB short neck, 5 diaphramagtic breaths, short neck, bil shoulder collectors, bilateral axiilary nodes, anterior and lateral neck, submental focus and then return of pathways                PT Short Term Goals - 06/22/17 1113      PT SHORT TERM GOAL #1   Title  pt will be independent with her initial HEP for pelvic floor contraction in hookly    Time  4    Period  Weeks    Status  Achieved      PT SHORT TERM GOAL #2   Title  understand what vaginal moisturizers are and how they improve vaginal health    Time  4    Period  Weeks    Status  Achieved      PT SHORT TERM GOAL #3   Title  understand how to toilet correctly to decrease straining of the pelvic floor     Time  4    Period  Weeks    Status  Achieved        PT Long Term Goals - 08/03/17 1528      PT LONG TERM GOAL #1   Title  --    Status  --      PT LONG TERM GOAL #6   Title  Pt will be independent with self MLD and self compression for head and neck lymphedema    Status  Achieved      PT LONG TERM GOAL #7   Title  Pt will report decrease in subjective feelings of Lt neck stiffness  in the morning by 50% or greater    Baseline  80% improvement reported a this time-08/03/17    Status  Achieved      PT LONG TERM GOAL #8   Title  Pt will improve cervical AROM to no pulling experienced     Baseline  Pt demonstrated full cervical ROM with no reported tightness-08/03/17     Status  Achieved      PT LONG TERM GOAL  #9   TITLE  Pt will be independent with HEP for shoulder and grip strength     Baseline  Rt 65# and Lt 61# grip strength, pt falls within normal range for her age-37/17/19    Status  Achieved         Head and Neck Clinic Goals - 11/30/16 1253      Patient will be able to verbalize understanding of a home exercise program for cervical range of motion, posture, and walking.    Status  Achieved      Patient will be able to verbalize understanding of proper sitting and standing posture.    Status  Achieved      Patient will be able to verbalize understanding of lymphedema risk and availability of treatment for this condition.    Status  Achieved        Plan - 08/03/17 1603    Clinical Impression Statement  Pt has met all goals with Cancer Rehab and will be D/C from our services at this time.     Rehab Potential  Excellent    Clinical Impairments Affecting Rehab Potential  left spinal accessory nerve injury; remote H/O back surgery; malignant neoplasm of base of tongue with radiation treatment    PT Frequency  1x / week    PT Duration  8 weeks    PT Treatment/Interventions  Therapeutic activities;Therapeutic exercise;Patient/family education;Manual techniques;Energy conservation;Manual lymph drainage;Taping    PT Next Visit Plan  D/C from Cancer Rehab today.    Consulted and Agree with Plan of Care  Patient       Patient will benefit from skilled therapeutic intervention in order to improve the following deficits and impairments:  Decreased strength, Decreased activity tolerance, Decreased coordination, Pain, Increased fascial restricitons  Visit Diagnosis: Aftercare following surgery for neoplasm  Lymphedema, not elsewhere classified  Stiffness of right shoulder, not elsewhere classified  Stiffness of left shoulder, not elsewhere classified  Cervicalgia     Problem List Patient Active Problem List    Diagnosis Date Noted  . Acute encephalopathy 05/15/2017  . UTI (urinary tract infection) 05/15/2017  . Hypotension 02/10/2017  . 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 ([place]) 10/29/2016  . Malignant neoplasm of base of tongue ([place]) 10/29/2016  . 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[place]) 10/29/2016  . Headache associated with sexual activity 05/14/2016  . Tongue lesion 05/14/2016  . Loose stools 03/12/2016  . Hypocalcemia 01/22/2016  . Thrombocytopenia ([place]) 01/22/2016  . Chronic diarrhea 01/22/2016  . Chest pain   . Essential hypertension   . Orthostatic hypotension   . Spinal stenosis of lumbar region 01/20/2016    Class: Chronic  . DDD (degenerative disc disease), lumbar 01/20/2016    Class: Chronic  . Spinal stenosis of lumbar region with neurogenic claudication 01/20/2016  . Low back pain 12/22/2015  . Pre-op examination 12/22/2015  . Hypersomnolence 10/13/2015  . Muscle cramp 08/01/2015  . GERD (gastroesophageal reflux disease) 07/09/2015  . Excessive daytime sleepiness 06/18/2015  . Shingles 08/28/2013  . Postop  check 08/28/2013  . Cholelithiasis 07/10/2013  . Colitis 01/08/2013  . Postoperative anemia 01/08/2013  . Morbid obesity with BMI of 40.0-44.9, adult ([place]) 01/08/2013    Golda Acre 08/03/2017, 4:07 PM  [place], [place], [address] Phone: [phone]   Fax:  [phone]  Name: Kristen [name] MRN: [id number] Date of Birth: January 30, 1965

## 2017-08-03 NOTE — Assessment & Plan Note (Signed)
She lost 80 pounds following her treatment for oropharyngeal cancer, and now feeding with G-tube.  She does not have obstructive sleep apnea.  A specific cause for her hypersomnia is not identified.  She does not have cataplexy but may benefit from treatment as narcolepsy.  We discussed sleep hygiene.  Eventually it may be helpful to get NPSG/ MSLT. We discussed trial of careful use of a controlled stimulant-Adderall. Plan-naps when able. Adderall 10 mg, 1-3 daily if needed

## 2017-08-03 NOTE — Telephone Encounter (Signed)
Attempted to call patient today regarding rx Adderall not being covered by insurance. I did not receive an answer at time of call. I have left a voicemail message for pt to return call. X1

## 2017-08-03 NOTE — Patient Instructions (Addendum)
Script printed for Adderall 10 mg, take up to 3 daily if needed for excessive sleepiness. Ok to skip if not needed.  Please call as needed

## 2017-08-04 ENCOUNTER — Encounter (INDEPENDENT_AMBULATORY_CARE_PROVIDER_SITE_OTHER): Payer: Self-pay | Admitting: Surgery

## 2017-08-04 ENCOUNTER — Ambulatory Visit (INDEPENDENT_AMBULATORY_CARE_PROVIDER_SITE_OTHER): Payer: Self-pay

## 2017-08-04 ENCOUNTER — Ambulatory Visit (INDEPENDENT_AMBULATORY_CARE_PROVIDER_SITE_OTHER): Payer: BC Managed Care – PPO | Admitting: Surgery

## 2017-08-04 VITALS — BP 111/73 | HR 62 | Ht 69.0 in | Wt 185.0 lb

## 2017-08-04 DIAGNOSIS — M533 Sacrococcygeal disorders, not elsewhere classified: Secondary | ICD-10-CM | POA: Diagnosis not present

## 2017-08-04 DIAGNOSIS — G8929 Other chronic pain: Secondary | ICD-10-CM

## 2017-08-04 DIAGNOSIS — M5442 Lumbago with sciatica, left side: Secondary | ICD-10-CM

## 2017-08-04 DIAGNOSIS — M5441 Lumbago with sciatica, right side: Secondary | ICD-10-CM

## 2017-08-04 LAB — URINE CULTURE

## 2017-08-04 NOTE — Progress Notes (Signed)
Office Visit Note   Patient: Kristen [name]           Date of Birth: 10-Jun-1965           MRN: [id number] Visit Date: 08/04/2017              Requested by: [name], MD [place], [place] [address] PCP: [name], MD   Assessment & Plan: Visit Diagnoses:  1. Chronic bilateral low back pain with bilateral sciatica   2. Chronic right SI joint pain     Plan: I will schedule consult with Dr. Ernestina Patches to discuss doing right SI joint injection versus radiofrequency ablation.  She did well with previous diagnostic/therapeutic SI joint injections.  Follow-up with Dr. Louanne Skye in 3 months for recheck.  In regards to the numbness and tingling that she is having in the legs after she is sitting for prolonged periods I advised her that she should be up moving around a little bit more and not sitting down too long.  After long discussion with patient she also brought up some intermittent numbness and tingling that she is having in both upper extremities.  I did discuss referring to neurology versus doing NCV/EMG studies.  I stressed to her trying to modify her activity some to see if this helps her symptoms.  If this worsens she will let us know.  Follow-Up Instructions: Return in about 3 months (around 11/04/2017) for With Dr. Louanne Skye.   Orders:  Orders Placed This Encounter  Procedures  . XR Lumbar Spine Complete  . Ambulatory referral to Physical Medicine Rehab   No orders of the defined types were placed in this encounter.     Procedures: No procedures performed   Clinical Data: No additional findings.   Subjective: Chief Complaint  Patient presents with  . Lower Back - Pain    HPI 52 year old white female returns with complaints of right greater than left SI joint pain.  She is had previous SI joint injection in the past with excellent relief.  Last office visit I discussed with her possibly trying radio frequency ablation with Dr. Ernestina Patches.  Patient  is status post L3-4 and L4-5 fusion with Dr. Louanne Skye.  She is also had rod placement for scoliosis.  She complains of some numbness and tingling in both legs after she has been sitting for prolonged periods.  This is better when she goes to stand up or when she is walking.  No complaints of radicular leg pain.  No bowel or bladder incontinence. Review of Systems No current cardiac pulmonary GI GU issues  Objective: Vital Signs: BP 111/73 (BP Location: Right Arm, Patient Position: Sitting, Cuff Size: Normal)   Pulse 62   Ht 5' 9"  (1.753 m)   Wt 185 lb (83.9 kg)   LMP 11/15/2013   BMI 27.32 kg/m   Physical Exam  Ortho Exam  Specialty Comments:  No specialty comments available.  Imaging: No results found.   PMFS History: Patient Active Problem List   Diagnosis Date Noted  . Acute encephalopathy 05/15/2017  . UTI (urinary tract infection) 05/15/2017  . Hypotension 02/10/2017  . 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 Malignant neoplasm of base of tongue ([place]) 10/29/2016  . 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[place]) 10/29/2016  . Headache associated with sexual activity 05/14/2016  . Tongue lesion 05/14/2016  . Loose stools 03/12/2016  . Hypocalcemia 01/22/2016  . Thrombocytopenia ([place]) 01/22/2016  .  Chronic diarrhea 01/22/2016  . Chest pain   . Essential hypertension   . Orthostatic hypotension   . Spinal stenosis of lumbar region 01/20/2016    Class: Chronic  . DDD (degenerative disc disease), lumbar 01/20/2016    Class: Chronic  . Spinal stenosis of lumbar region with neurogenic claudication 01/20/2016  . Low back pain 12/22/2015  . Pre-op examination 12/22/2015  . Hypersomnolence 10/13/2015  . Muscle cramp 08/01/2015  . GERD (gastroesophageal reflux disease) 07/09/2015  . Excessive daytime sleepiness 06/18/2015  . Shingles 08/28/2013  . Postop check 08/28/2013  . Cholelithiasis 07/10/2013  . Colitis 01/08/2013  . Postoperative anemia  01/08/2013  . Morbid obesity with BMI of 40.0-44.9, adult ([place]) 01/08/2013   Past Medical History:  Diagnosis Date  . Allergy   . Anemia    Iron deficiency  . Anxiety   . Arthritis    back, left shoulder  . Cancer ([place])   . Chicken pox   . Depression   . Diarrhea    takes Imodium daily  . GERD (gastroesophageal reflux disease)   . H/O hiatal hernia   . Headache(784.0)   . History of kidney stones    1996ish  . History of radiation therapy 11/16/16- 01/01/17   Base of Tongue/ 66 gy in 33 fractions/ Dose: 2 Gy  . Hypertension   . Migraine    none for 5 years (as of 01/13/16)  . OSA (obstructive sleep apnea) 10/13/2015   unable to get cpap, plans to get one in 2018  . Pneumonia   . Restless legs   . Scoliosis   . Shortness of breath    with exertion    Family History  Problem Relation Age of Onset  . Heart disease Father   . Heart attack Father   . Hypertension Mother   . Arthritis Mother   . Diabetes Maternal Grandmother   . Diabetes Paternal Grandmother   . Diabetes Maternal Uncle     Past Surgical History:  Procedure Laterality Date  . BACK SURGERY  07/21/1978  . CHOLECYSTECTOMY N/A 08/15/2013   Procedure: LAPAROSCOPIC CHOLECYSTECTOMY WITH INTRAOPERATIVE CHOLANGIOGRAM;  Surgeon: Gwenyth Ober, MD;  Location: [facility];  Service: General;  Laterality: N/A;  . COLONOSCOPY N/A 01/09/2013   Procedure: COLONOSCOPY;  Surgeon: Beryle Beams, MD;  Location: [facility];  Service: Endoscopy;  Laterality: N/A;  . DIRECT LARYNGOSCOPY  07/2016   Dr. Nicolette Bang [facility]  . GASTROSTOMY TUBE PLACEMENT  09/27/2016  . HERNIA REPAIR Left 1981  . IR PATIENT EVAL TECH 0-60 MINS  12/13/2016  . IR PATIENT EVAL TECH 0-60 MINS  04/11/2017  . MODIFIED RADICAL NECK DISSECTION Left 09/27/2016   Levels 1 & 2  . PARTIAL GLOSSECTOMY Left 09/27/2016   Left hemi partial glossectomy  . SPINE SURGERY  01/20/2016   fusion  . TONSILLECTOMY    . tracheotomy  09/27/2016  . TUBAL LIGATION  06/1988    Social History   Occupational History  . Occupation: Air cabin crew  Tobacco Use  . Smoking status: Never Smoker  . Smokeless tobacco: Never Used  Substance and Sexual Activity  . Alcohol use: Yes    Alcohol/week: 4.2 oz    Types: 7 Cans of beer per week    Comment: 1 beer daily  . Drug use: No  . Sexual activity: Yes    Birth control/protection: Surgical

## 2017-08-04 NOTE — Telephone Encounter (Signed)
Attempted to call pt. I did not receive an answer. I have left a message for pt to return our call.  

## 2017-08-05 ENCOUNTER — Telehealth: Payer: Self-pay | Admitting: Internal Medicine

## 2017-08-05 ENCOUNTER — Telehealth (INDEPENDENT_AMBULATORY_CARE_PROVIDER_SITE_OTHER): Payer: Self-pay | Admitting: Physical Medicine and Rehabilitation

## 2017-08-05 ENCOUNTER — Telehealth: Payer: Self-pay

## 2017-08-05 NOTE — Telephone Encounter (Signed)
Pt is calling back [phone]

## 2017-08-05 NOTE — Telephone Encounter (Signed)
We have received a fax from [employer]. Insurance has denied to cover D-Amphetamine salt combo 16m. Doctor YAnnamaria Bootsplease advise weather you would like u[place]ato start the PA or not. Thank you

## 2017-08-05 NOTE — Telephone Encounter (Signed)
I thought I answered this but ok to repeat and talk about RFA and problems getting it approved with [facility]

## 2017-08-05 NOTE — Telephone Encounter (Signed)
We still have not gotten any PA information from [facility]. Called [facility] and was given PA information. Contacted pt's insurance at [id number]. Pt's insurance ID: [id number]. PA has been initiated and approved for the next 36 months. [facility] has been made aware of this as well as the pt. Nothing further was needed.

## 2017-08-05 NOTE — Telephone Encounter (Signed)
Post ED Visit - Positive Culture Follow-up  Culture report reviewed by antimicrobial stewardship pharmacist:  []  Elenor Quinones, Pharm.D. []  Heide Guile, Pharm.D., BCPS AQ-ID []  Parks Neptune, Pharm.D., BCPS []  Alycia Rossetti, Pharm.D., BCPS []  [place], Pharm.D., BCPS, AAHIVP []  Legrand Como, Pharm.D., BCPS, AAHIVP [x]  Salome Arnt, PharmD, BCPS []  Johnnette Gourd, PharmD, BCPS []  Hughes Better, PharmD, BCPS []  Leeroy Cha, PharmD  Positive urine culture Treated with Cephalexin, organism sensitive to the same and no further patient follow-up is required at this time.  [name] 08/05/2017, 10:25 AM

## 2017-08-08 NOTE — Telephone Encounter (Signed)
Ok to try PA. If she has Medicaid, they may not cover any of the stimulants.

## 2017-08-19 NOTE — Telephone Encounter (Signed)
Please see below:   Randa Spike, CMA       1:15 PM  Note    We still have not gotten any PA information from [facility]. Called [facility] and was given PA information. Contacted pt's insurance at [phone]. Pt's insurance ID: [id number]. PA has been initiated and approved for the next 36 months. [facility] has been made aware of this as well as the pt. Nothing further was needed.           Nothing more needed from our office.

## 2017-08-24 ENCOUNTER — Encounter (INDEPENDENT_AMBULATORY_CARE_PROVIDER_SITE_OTHER): Payer: Self-pay | Admitting: Physical Medicine and Rehabilitation

## 2017-08-24 ENCOUNTER — Ambulatory Visit (INDEPENDENT_AMBULATORY_CARE_PROVIDER_SITE_OTHER): Payer: Self-pay

## 2017-08-24 ENCOUNTER — Encounter

## 2017-08-24 ENCOUNTER — Ambulatory Visit (INDEPENDENT_AMBULATORY_CARE_PROVIDER_SITE_OTHER): Payer: BC Managed Care – PPO | Admitting: Physical Medicine and Rehabilitation

## 2017-08-24 ENCOUNTER — Telehealth: Payer: Self-pay | Admitting: *Deleted

## 2017-08-24 DIAGNOSIS — M961 Postlaminectomy syndrome, not elsewhere classified: Secondary | ICD-10-CM

## 2017-08-24 DIAGNOSIS — M461 Sacroiliitis, not elsewhere classified: Secondary | ICD-10-CM

## 2017-08-24 NOTE — Progress Notes (Signed)
    Numeric Pain Rating Scale and Functional Assessment Average Pain 5   In the last MONTH (on 0-10 scale) has pain interfered with the following?  1. General activity like being  able to carry out your everyday physical activities such as walking, climbing stairs, carrying groceries, or moving a chair?  Rating(3)   -Dye Allergies.

## 2017-08-24 NOTE — Patient Instructions (Signed)

## 2017-08-24 NOTE — Progress Notes (Signed)
Kristen [name] - 52 y.o. female MRN [id number]  Date of birth: 28-Sep-1965  Office Visit Note: Visit Date: 08/24/2017 PCP: Kristen Ruddy, MD Referred by: Kristen Ruddy, MD  Subjective: Chief Complaint  Patient presents with  . Right Hip - Pain   HPI: Kristen [name] is a 52 year old female that comes in today at the request of Kristen Core, PA-C for repeat of diagnostic and hopefully therapeutic right sacroiliac joint injection.  Patient does have pain over the right buttock region consistent with a positive Fortin finger sign.  She has lumbar fusion at L3-4 L4-5.  She has prior surgery for scoliosis as well.  She is medically fairly complex with a history of head neck cancer.  Prior injection gave her some relief but she had some issues going on at the time and really cannot tell me exactly how much relief she did get.  We had a brief discussion on radiofrequency ablation of the sacroiliac joint which can be problematic from a coverage in terms of insurance standpoint and also consistency with getting relief.  Depending on relief she gets with sacroiliac joint injection I would suggest L5-S1 facet joint block below the fusion.   ROS Otherwise per HPI.  Assessment & Plan: Visit Diagnoses:  1. Sacroiliitis, not elsewhere classified ([place])   2. Post laminectomy syndrome     Plan: No additional findings.   Meds & Orders: No orders of the defined types were placed in this encounter.   Orders Placed This Encounter  Procedures  . Sacroiliac Joint Inj  . XR C-ARM NO REPORT    Follow-up: Return if symptoms worsen or fail to improve.   Procedures: Sacroiliac Joint Inj on 08/24/2017 3:56 PM Indications: pain and diagnostic evaluation Details: 22 G 3.5 in needle, fluoroscopy-guided posterior approach Medications: 2 mL bupivacaine 0.5 %; 80 mg methylPREDNISolone acetate 80 MG/ML Outcome: tolerated well, no immediate complications  There was excellent flow of contrast producing a partial  arthrogram of the sacroiliac joint.  Procedure, treatment alternatives, risks and benefits explained, specific risks discussed. Consent was given by the patient. Immediately prior to procedure a time out was called to verify the correct patient, procedure, equipment, support staff and site/side marked as required. Patient was prepped and draped in the usual sterile fashion.      No notes on file   Clinical History: No specialty comments available.   She reports that she has never smoked. She has never used smokeless tobacco. No results for input(s): HGBA1C, LABURIC in the last 8760 hours.  Objective:  VS:  HT:    WT:   BMI:     BP:   HR: bpm  TEMP: ( )  RESP:  Physical Exam  Ortho Exam Imaging: No results found.  Past Medical/Family/Surgical/Social History: Medications & Allergies reviewed per EMR, new medications updated. Patient Active Problem List   Diagnosis Date Noted  . Acute encephalopathy 05/15/2017  . UTI (urinary tract infection) 05/15/2017  . Hypotension 02/10/2017  . 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 Malignant neoplasm of base of tongue ([place]) 10/29/2016  . 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[place]) 10/29/2016  . Headache associated with sexual activity 05/14/2016  . Tongue lesion 05/14/2016  . Loose stools 03/12/2016  . Hypocalcemia 01/22/2016  . Thrombocytopenia ([place]) 01/22/2016  . Chronic diarrhea 01/22/2016  . Chest pain   . Essential hypertension   . Orthostatic hypotension   . Spinal stenosis of lumbar region 01/20/2016  Class: Chronic  . DDD (degenerative disc disease), lumbar 01/20/2016    Class: Chronic  . Spinal stenosis of lumbar region with neurogenic claudication 01/20/2016  . Low back pain 12/22/2015  . Pre-op examination 12/22/2015  . Hypersomnolence 10/13/2015  . Muscle cramp 08/01/2015  . GERD (gastroesophageal reflux disease) 07/09/2015  . Excessive daytime sleepiness 06/18/2015  . Shingles 08/28/2013    . Postop check 08/28/2013  . Cholelithiasis 07/10/2013  . Colitis 01/08/2013  . Postoperative anemia 01/08/2013  . Morbid obesity with BMI of 40.0-44.9, adult ([place]) 01/08/2013   Past Medical History:  Diagnosis Date  . Allergy   . Anemia    Iron deficiency  . Anxiety   . Arthritis    back, left shoulder  . Cancer ([place])   . Chicken pox   . Depression   . Diarrhea    takes Imodium daily  . GERD (gastroesophageal reflux disease)   . H/O hiatal hernia   . Headache(784.0)   . History of kidney stones    1996ish  . History of radiation therapy 11/16/16- 01/01/17   Base of Tongue/ 66 gy in 33 fractions/ Dose: 2 Gy  . Hypertension   . Migraine    none for 5 years (as of 01/13/16)  . OSA (obstructive sleep apnea) 10/13/2015   unable to get cpap, plans to get one in 2018  . Pneumonia   . Restless legs   . Scoliosis   . Shortness of breath    with exertion   Family History  Problem Relation Age of Onset  . Heart disease Father   . Heart attack Father   . Hypertension Mother   . Arthritis Mother   . Diabetes Maternal Grandmother   . Diabetes Paternal Grandmother   . Diabetes Maternal Uncle    Past Surgical History:  Procedure Laterality Date  . BACK SURGERY  07/21/1978  . CHOLECYSTECTOMY N/A 08/15/2013   Procedure: LAPAROSCOPIC CHOLECYSTECTOMY WITH INTRAOPERATIVE CHOLANGIOGRAM;  Surgeon: Gwenyth Ober, MD;  Location:  [place];  Service: General;  Laterality: N/A;  . COLONOSCOPY N/A 01/09/2013   Procedure: COLONOSCOPY;  Surgeon: Beryle Beams, MD;  Location: [place];  Service: Endoscopy;  Laterality: N/A;  . DIRECT LARYNGOSCOPY  07/2016   Dr. Nicolette Bang [facility]  . GASTROSTOMY TUBE PLACEMENT  09/27/2016  . HERNIA REPAIR Left 1981  . IR PATIENT EVAL TECH 0-60 MINS  12/13/2016  . IR PATIENT EVAL TECH 0-60 MINS  04/11/2017  . MODIFIED RADICAL NECK DISSECTION Left 09/27/2016   Levels 1 & 2  . PARTIAL GLOSSECTOMY Left 09/27/2016   Left hemi partial glossectomy  . SPINE SURGERY   01/20/2016   fusion  . TONSILLECTOMY    . tracheotomy  09/27/2016  . TUBAL LIGATION  06/1988   Social History   Occupational History  . Occupation: Air cabin crew  Tobacco Use  . Smoking status: Never Smoker  . Smokeless tobacco: Never Used  Substance and Sexual Activity  . Alcohol use: Yes    Alcohol/week: 7.0 standard drinks    Types: 7 Cans of beer per week    Comment: 1 beer daily  . Drug use: No  . Sexual activity: Yes    Birth control/protection: Surgical

## 2017-08-24 NOTE — Telephone Encounter (Signed)
Oncology Nurse Navigator Documentation  In follow-up to pt's call to RD Dory Peru with request for PEG syringes, called pt to inform her I am leaving 6 syringes at [facility] front desk for her.  She expressed appreciation.  Gayleen Orem, RN, BSN Head & Neck Oncology Nurse [place] at [place] [phone]

## 2017-08-29 MED ORDER — BUPIVACAINE HCL 0.5 % IJ SOLN
2.0000 mL | INTRAMUSCULAR | Status: AC | PRN
  Administered 2017-08-24: 2 mL via INTRA_ARTICULAR

## 2017-08-29 MED ORDER — METHYLPREDNISOLONE ACETATE 80 MG/ML IJ SUSP
80.0000 mg | INTRAMUSCULAR | Status: AC | PRN
  Administered 2017-08-24: 80 mg via INTRA_ARTICULAR

## 2017-08-31 ENCOUNTER — Encounter: Payer: Self-pay | Admitting: Gastroenterology

## 2017-09-01 ENCOUNTER — Encounter: Payer: Self-pay | Admitting: Internal Medicine

## 2017-09-05 ENCOUNTER — Ambulatory Visit: Payer: BC Managed Care – PPO | Attending: Radiation Oncology

## 2017-09-06 ENCOUNTER — Telehealth: Payer: Self-pay | Admitting: Internal Medicine

## 2017-09-06 MED ORDER — AMPHETAMINE-DEXTROAMPHETAMINE 10 MG PO TABS: ORAL_TABLET | ORAL | 0 refills | Status: DC

## 2017-09-06 NOTE — Telephone Encounter (Signed)
Last OV: 08/03/17 with CY Next OV: 11/07/17 with CY Last RX: 08/03/17 for 50 tablets  CY, please advise if you are ok with this refill. Thanks!

## 2017-09-06 NOTE — Telephone Encounter (Signed)
RX has been printed and signed by CY. Patient is aware and wishes to have this mailed to her. Verified address with patient. Advised her that I would put this in the mail today.

## 2017-09-06 NOTE — Telephone Encounter (Signed)
Ok to refill 

## 2017-09-07 ENCOUNTER — Telehealth: Payer: Self-pay | Admitting: *Deleted

## 2017-09-07 ENCOUNTER — Encounter (INDEPENDENT_AMBULATORY_CARE_PROVIDER_SITE_OTHER): Payer: Self-pay

## 2017-09-07 NOTE — Telephone Encounter (Signed)
CALLED PATIENT TO ASK ABOUT RESCHEDULING FU FOR 09-16-17 DUE TO DR. SQUIRE BEING ON VACATION, RESCHEDULED FOR 09-23-17 @ 10 AM , NO ANSWER, WILL CALL LATER

## 2017-09-07 NOTE — Telephone Encounter (Signed)
Patient would like a Rx refill on Tramadol?  Please Advise.  Thank You.

## 2017-09-08 ENCOUNTER — Ambulatory Visit: Payer: BC Managed Care – PPO | Admitting: Gastroenterology

## 2017-09-12 ENCOUNTER — Encounter (INDEPENDENT_AMBULATORY_CARE_PROVIDER_SITE_OTHER): Payer: Self-pay

## 2017-09-13 ENCOUNTER — Other Ambulatory Visit (INDEPENDENT_AMBULATORY_CARE_PROVIDER_SITE_OTHER): Payer: Self-pay | Admitting: Radiology

## 2017-09-13 ENCOUNTER — Telehealth (INDEPENDENT_AMBULATORY_CARE_PROVIDER_SITE_OTHER): Payer: Self-pay | Admitting: Physical Medicine and Rehabilitation

## 2017-09-13 NOTE — Telephone Encounter (Signed)
Patient called to check on the status of medication refill

## 2017-09-13 NOTE — Telephone Encounter (Signed)
I would do right L5-S1 facet block

## 2017-09-14 MED ORDER — TRAMADOL HCL 50 MG PO TABS: 50.0000 mg | ORAL_TABLET | Freq: Four times a day (QID) | ORAL | 0 refills | Status: DC | PRN

## 2017-09-14 NOTE — Telephone Encounter (Signed)
Left message for patient to call back to schedule.  °

## 2017-09-15 ENCOUNTER — Other Ambulatory Visit (INDEPENDENT_AMBULATORY_CARE_PROVIDER_SITE_OTHER): Payer: Self-pay | Admitting: Specialist

## 2017-09-15 NOTE — Progress Notes (Signed)
Ms. Kristen [name] presents for follow up of radiation completed 01/01/2017 to her tongue.   Pain issues, if any: She reports right hip chronic pain. She is receiving injections. It affects her mobility.  Using a feeding tube?: Yes, She is instilling 3 cans daily. She has difficulty purchasing supplements and rations her supplements until she is able to purchase more.  Weight changes, if any: She tells me that her weight fluctuates. She will gain weight and lose weight periodically.  Wt Readings from Last 3 Encounters:  09/23/17 175 lb 12.8 oz (79.7 kg)  08/04/17 185 lb (83.9 kg)  08/03/17 185 lb 9.6 oz (84.2 kg)   Swallowing issues, if any: She denies difficulty swallowing. She needs to cut up her food well to swallow. She does report taste changes which affects her appetite. She does not eat much meat.  Smoking or chewing tobacco? No Using fluoride trays daily?  Last ENT visit was on: Dr. Nicolette Bang last on 05/31/17 next on 11/29/17 Other notable issues, if any:   BP 109/86 (BP Location: Right Arm)   Pulse 84   Temp 98.5 F (36.9 C) (Oral)   Ht 5' 9"  (1.753 m)   Wt 175 lb 12.8 oz (79.7 kg)   LMP 11/15/2013   SpO2 98%   BMI 25.96 kg/m

## 2017-09-15 NOTE — Telephone Encounter (Signed)
Per sevina h [employer] representative no prior Josem Kaufmann is needed for [phone].  Ref#[id number]

## 2017-09-15 NOTE — Telephone Encounter (Signed)
Called to [employer]

## 2017-09-15 NOTE — Telephone Encounter (Signed)
Diclofenac refill request

## 2017-09-16 ENCOUNTER — Ambulatory Visit: Payer: Self-pay | Admitting: Radiation Oncology

## 2017-09-20 ENCOUNTER — Telehealth: Payer: Self-pay | Admitting: *Deleted

## 2017-09-20 NOTE — Telephone Encounter (Signed)
Called patient to remind of fu with Dr. Isidore Moos on 09-23-17 @ 10:20 am, lvm for a return call

## 2017-09-23 ENCOUNTER — Other Ambulatory Visit: Payer: Self-pay

## 2017-09-23 ENCOUNTER — Ambulatory Visit
Admission: RE | Admit: 2017-09-23 | Discharge: 2017-09-23 | Disposition: A | Discharge status: Home / Self Care | Payer: BC Managed Care – PPO | Source: Ambulatory Visit | Attending: Radiation Oncology | Admitting: Radiation Oncology

## 2017-09-23 ENCOUNTER — Other Ambulatory Visit (INDEPENDENT_AMBULATORY_CARE_PROVIDER_SITE_OTHER): Payer: Self-pay

## 2017-09-23 ENCOUNTER — Encounter: Payer: Self-pay | Admitting: Radiation Oncology

## 2017-09-23 VITALS — BP 109/86 | HR 84 | Temp 98.5°F | Ht 69.0 in | Wt 175.8 lb

## 2017-09-23 DIAGNOSIS — C01 Malignant neoplasm of base of tongue: Secondary | ICD-10-CM | POA: Diagnosis not present

## 2017-09-23 DIAGNOSIS — C760 Malignant neoplasm of head, face and neck: Secondary | ICD-10-CM | POA: Insufficient documentation

## 2017-09-23 DIAGNOSIS — Z79899 Other long term (current) drug therapy: Secondary | ICD-10-CM | POA: Insufficient documentation

## 2017-09-23 DIAGNOSIS — C0680 Malignant neoplasm of overlapping sites of unspecified parts of mouth: Secondary | ICD-10-CM | POA: Diagnosis not present

## 2017-09-23 DIAGNOSIS — M1990 Unspecified osteoarthritis, unspecified site: Secondary | ICD-10-CM | POA: Diagnosis not present

## 2017-09-23 DIAGNOSIS — Z7982 Long term (current) use of aspirin: Secondary | ICD-10-CM | POA: Diagnosis not present

## 2017-09-23 MED ORDER — TRAMADOL HCL 50 MG PO TABS: ORAL_TABLET | ORAL | 0 refills | Status: DC

## 2017-09-23 MED ORDER — LARYNGOSCOPY SOLUTION RAD-ONC
15.0000 mL | Freq: Once | TOPICAL | Status: AC
  Administered 2017-09-23: 15 mL via TOPICAL
  Filled 2017-09-23: qty 15

## 2017-09-23 MED ORDER — FLUCONAZOLE 100 MG PO TABS: ORAL_TABLET | ORAL | 0 refills | Status: DC

## 2017-09-23 NOTE — Progress Notes (Signed)
Radiation Oncology         [phone] ________________________________  Name: Kristen [name] MRN: [id number]  Date: 09/23/2017  DOB: 1965/10/06  Follow-Up Visit Note  CC: [name], MD  Francina Ames, MD  Diagnosis and Prior Radiotherapy:       ICD-10-CM   1. Malignant neoplasm of base of tongue ([place]) C01 fluconazole (DIFLUCAN) 100 MG tablet    Ambulatory referral to [place]    laryngocopy solution for Rad-Onc    Fiberoptic laryngoscopy  2. 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[place]) C06.80   3. 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 Radiation treatment dates: 11/16/2016 - 01/01/2017 Site/dose: 66 Gy directed to the tongue in 33 fractions.  CHIEF COMPLAINT:  Here for follow-up and surveillance of head and neck cancer  Narrative:  The patient returns today for routine follow-up, patient completed radiation 8 months ago to her tongue. She has been doing well overall. She is also having recurrent urinary tract infections q 3 months.   She is taking a daily multivitamin and she is eating very little meat at this time. She tries to make sure that she eats daily to maintain her weight.   Since her last visit to the office, she underwent a CT soft tissue neck w contrast on 05/23/2017 that showed: Hemiglossectomy on the left. This will serve as baseline study for future imaging. No obvious recurrent tumor identified. No adenopathy. Radiation changes anterior neck. Surgical resection left submandibular gland. Segmental occlusion proximal left jugular vein, felt to be postsurgical.  She also had a CT CAP w contrast on 05/23/2017 that showed: No evidence for metastatic disease in the chest, abdomen, or Pelvis. Interval removal of tracheostomy tube. Areas of cortical scarring in the kidneys bilaterally with caliceal diverticuli. Imaging features not substantially changed since 01/08/2013.  Thoracolumbar scoliosis, status post fusion.  CT Angio of neck on 08-01-17 showed Postsurgical changes related to left hemiglossectomy, submandibular gland resection, and left anterior neck dissection. No recurrent disease identified.  She saw Dr. Nicolette Bang on 05/31/2017 at [facility] with a follow up appointment in November. She did not see urology per my referral due to co-pay requirements.  On review of systems, she denies SOB, lumps/bumps to neck, and any other symptoms. Pertinent positives are listed and detailed within the above HPI. She would like 60 cc syringes to be prescribed and ordered through [place] in order to use her peg tube.   Pain issues, if any:  Hip pain due to arthritis, she is receiving injections for this.    Weight changed, if any: yes, she has lost some weight and she has to consume soft foods and eat once daily.   Wt Readings from Last 3 Encounters:  09/23/17 175 lb 12.8 oz (79.7 kg)  08/04/17 185 lb (83.9 kg)  08/03/17 185 lb 9.6 oz (84.2 kg)    ALLERGIES:  is allergic to other and sulfur.  Meds: Current Outpatient Medications  Medication Sig Dispense Refill  . amLODipine (NORVASC) 10 MG tablet Take 1 tablet (10 mg total) daily by mouth. (Patient taking differently: Place 10 mg into feeding tube daily. ) 90 tablet 3  . amphetamine-dextroamphetamine (ADDERALL) 10 MG tablet 1-3 daily as needed for sleepiness 50 tablet 0  . aspirin 81 MG chewable tablet Place 81 mg into feeding tube daily.     . baclofen (LIORESAL) 10  MG tablet Place 10 mg into feeding tube 3 (three) times daily.   4  . calcium carbonate (OSCAL) 1500 (600 Ca) MG TABS tablet 600 mg of elemental calcium 2 (two) times daily with a meal. Per tube    . diclofenac (VOLTAREN) 75 MG EC tablet TAKE 1 TABLET BY MOUTH TWICE DAILY WITH FOOD AS NEEDED FOR PAIN 180 tablet 3  . Eluxadoline (VIBERZI) 100 MG TABS Take 1 tablet (100 mg total) by mouth 2 (two) times daily. (Patient taking differently:  Place 100 mg into feeding tube 2 (two) times daily. ) 60 tablet 0  . ergocalciferol (DRISDOL) 8000 UNIT/ML drops Place 6 mLs (48,000 Units total) into feeding tube once a week. 60 mL 1  . gabapentin (NEURONTIN) 300 MG capsule Take 1 capsule (300 mg total) by mouth 3 (three) times daily. (Patient taking differently: Place 300 mg into feeding tube 3 (three) times daily. ) 270 capsule 3  . LORazepam (ATIVAN) 0.5 MG tablet Take 1 tab PRN nausea. Do not take more than 3 pills daily. 30 tablet 0  . pantoprazole (PROTONIX) 40 MG tablet Take 1 tablet (40 mg total) daily by mouth. (Patient taking differently: 40 mg daily. Per tube) 90 tablet 3  . Potassium 99 MG TABS 1 tablet (99 mg total) by Gastrostomy Tube route daily. (Patient taking differently: Place 1 tablet into feeding tube daily. ) 330 tablet 1  . SUMAtriptan (IMITREX) 100 MG tablet Take 0.5-1 tablet by mouth at the onset of migraine and may repeat in 2 hours if headache persists or recurs. (Patient taking differently: Place 100 mg into feeding tube every 2 (two) hours as needed for migraine or headache. ) 10 tablet 0  . traMADol (ULTRAM) 50 MG tablet Take 1 tablet (50 mg total) by mouth every 6 (six) hours as needed. for pain 40 tablet 0  . Eluxadoline (VIBERZI) 100 MG TABS Take 1 tablet (100 mg total) by mouth 2 (two) times daily. 90 tablet 3  . fluconazole (DIFLUCAN) 100 MG tablet Take 2 tablets today, then 1 tablet daily x 6 more days. 8 tablet 0  . traMADol (ULTRAM) 50 MG tablet Take 1 tab PO  every 12 hours as needed (Patient not taking: Reported on 09/23/2017) 50 tablet 0   No current facility-administered medications for this encounter.     Physical Findings: The patient is in no acute distress. Patient is alert and oriented. Wt Readings from Last 3 Encounters:  09/23/17 175 lb 12.8 oz (79.7 kg)  08/04/17 185 lb (83.9 kg)  08/03/17 185 lb 9.6 oz (84.2 kg)    height is 5' 9"  (1.753 m) and weight is 175 lb 12.8 oz (79.7 kg). Her oral  temperature is 98.5 F (36.9 C). Her blood pressure is 109/86 and her pulse is 84. Her oxygen saturation is 98%. .  General: Alert and oriented, in no acute distress HEENT: Head is normocephalic. Extraocular movements are intact. The right tongue is raised and fuller than the left side of her tongue. No lesions appreciated in the oral cavity or upper throat. Possible thrush. No palpable lesions over posterior tongue.  Neck: No palpable masses appreciated in the neck.  Heart: Regular in rate and rhythm with no murmurs, rubs, or gallops. Chest: Clear to auscultation bilaterally, with no rhonchi, wheezes, or rales.  Abdomen: PEG site nice and clean.  Extremities: No cyanosis or edema.  PROCEDURE NOTE: After obtaining consent and anesthetizing the nasal cavity with topical lidocaine and phenylephrine, the flexible endoscope  was introduced and passed through the nasal cavity. She has thrush over the mucosa of the pharynx and supraglottis. True cords are symmetrically mobile. No lesions appreciated over the base of tongue or the remaining pharynx or larynx.   Lab Findings: Lab Results  Component Value Date   WBC 3.5 (L) 07/27/2017   HGB 11.6 (L) 07/27/2017   HCT 35.5 (L) 07/27/2017   MCV 84.7 07/27/2017   PLT 171.0 07/27/2017    Lab Results  Component Value Date   TSH 1.40 07/27/2017    Radiographic Findings: No results found.  Impression/Plan:    1) Head and Neck Cancer Status: NED  2) Risk Factors: The patient has been educated about risk factors including alcohol and tobacco abuse; they understand that avoidance of alcohol and tobacco is important to prevent recurrences as well as other cancers  3)  Thyroid function:  WNL Lab Results  Component Value Date   TSH 1.40 07/27/2017     4) Other: Will prescribe fluconazole Rx for oral thrush to be taken 2 tablets on the first day and then 1 tablet for the next 6 days. Advised the patient to try sweet potatoes, acorn or butternut  squash to aid with nutrition as she is mainly consuming white potatoes and macaroni.  Order placed for home health to deliver more syringes for her PEG.  5) Follow up in early March 2019 in Radiation Oncology w/ TSH, CXR. She will maintain follow up with Dr. Nicolette Bang on 11/29/2017. The patient was encouraged to call with any issues or questions before then.  I spent 25 minutes minutes face to face with the patient and more than 50% of that time was spent in counseling and/or coordination of care.    _____   Eppie Gibson, MD  This document serves as a record of services personally performed by Eppie Gibson, MD. It was created on her behalf by Steva Colder, a trained medical scribe. The creation of this record is based on the scribe's personal observations and the provider's statements to them. This document has been checked and approved by the attending provider.

## 2017-09-26 ENCOUNTER — Other Ambulatory Visit: Payer: Self-pay | Admitting: Radiation Oncology

## 2017-09-26 ENCOUNTER — Encounter: Payer: Self-pay | Admitting: Radiation Oncology

## 2017-09-26 DIAGNOSIS — R634 Abnormal weight loss: Secondary | ICD-10-CM

## 2017-09-26 DIAGNOSIS — C01 Malignant neoplasm of base of tongue: Secondary | ICD-10-CM

## 2017-09-27 ENCOUNTER — Ambulatory Visit (INDEPENDENT_AMBULATORY_CARE_PROVIDER_SITE_OTHER): Payer: BC Managed Care – PPO | Admitting: Physical Medicine and Rehabilitation

## 2017-09-27 ENCOUNTER — Ambulatory Visit (INDEPENDENT_AMBULATORY_CARE_PROVIDER_SITE_OTHER): Payer: Self-pay

## 2017-09-27 ENCOUNTER — Encounter (INDEPENDENT_AMBULATORY_CARE_PROVIDER_SITE_OTHER): Payer: Self-pay | Admitting: Physical Medicine and Rehabilitation

## 2017-09-27 VITALS — BP 105/69 | HR 74 | Temp 98.4°F

## 2017-09-27 DIAGNOSIS — M47816 Spondylosis without myelopathy or radiculopathy, lumbar region: Secondary | ICD-10-CM

## 2017-09-27 MED ORDER — METHYLPREDNISOLONE ACETATE 80 MG/ML IJ SUSP
40.0000 mg | Freq: Once | INTRAMUSCULAR | Status: AC
  Administered 2017-09-27: 40 mg

## 2017-09-27 NOTE — Patient Instructions (Signed)

## 2017-09-27 NOTE — Progress Notes (Signed)
 .  Numeric Pain Rating Scale and Functional Assessment Average Pain 8   In the last MONTH (on 0-10 scale) has pain interfered with the following?  1. General activity like being  able to carry out your everyday physical activities such as walking, climbing stairs, carrying groceries, or moving a chair?  Rating(4)   +Driver, -BT, -Dye Allergies.

## 2017-09-30 NOTE — Progress Notes (Signed)
Kristen [name] - 52 y.o. female MRN [id number]  Date of birth: 22-Jan-1965  Office Visit Note: Visit Date: 09/27/2017 PCP: [name], MD Referred by: [name], MD  Subjective: Chief Complaint  Patient presents with  . Right Hip - Pain  . Right Leg - Pain   HPI: Ms. Woodrum returns today after having second sacroiliac joint injection diagnostically.  She does report some relief with the injection for a week or so but no real lasting relief.  Both sacroiliac joints have given her more than 50% relief however.  At this point I would like to complete one diagnostic facet joint block below her fusion.  This would be an L5-S1 facet joint block for her right-sided axial low back pain.  Worse with facet joint loading and standing.  No radicular pain.  Depending on relief would look at either second diagnostic block and radiofrequency ablation.  Could end up doing ablation of the facet joint and the sacroiliac joint.  She has had prior lumbar fusion and is really failed conservative and nonconservative care at this point.  She will continue to follow-up with Benjiman Core, PA-C.   ROS Otherwise per HPI.  Assessment & Plan: Visit Diagnoses:  1. Spondylosis without myelopathy or radiculopathy, lumbar region     Plan: No additional findings.   Meds & Orders:  Meds ordered this encounter  Medications  . methylPREDNISolone acetate (DEPO-MEDROL) injection 40 mg    Orders Placed This Encounter  Procedures  . Facet Injection  . XR C-ARM NO REPORT    Follow-up: Return if symptoms worsen or fail to improve, for Consider RFA.   Procedures: No procedures performed  Lumbar Facet Joint Intra-Articular Injection(s) with Fluoroscopic Guidance  Patient: LACORA FOLMER      Date of Birth: August 19, 1965 MRN: [id number] PCP: [name], MD      Visit Date: 09/27/2017   Universal Protocol:    Date/Time: 09/27/2017  Consent Given By: the patient  Position: PRONE   Additional  Comments: Vital signs were monitored before and after the procedure. Patient was prepped and draped in the usual sterile fashion. The correct patient, procedure, and site was verified.   Injection Procedure Details:  Procedure Site One Meds Administered:  Meds ordered this encounter  Medications  . methylPREDNISolone acetate (DEPO-MEDROL) injection 40 mg     Laterality: Right  Location/Site:  L5-S1  Needle size: 22 guage  Needle type: Spinal  Needle Placement: Articular  Findings:  -Comments: Excellent flow of contrast producing a partial arthrogram.  Procedure Details: The fluoroscope beam is vertically oriented in AP, and the inferior recess is visualized beneath the lower pole of the inferior apophyseal process, which represents the target point for needle insertion. When direct visualization is difficult the target point is located at the medial projection of the vertebral pedicle. The region overlying each aforementioned target is locally anesthetized with a 1 to 2 ml. volume of 1% Lidocaine without Epinephrine.   The spinal needle was inserted into each of the above mentioned facet joints using biplanar fluoroscopic guidance. A 0.25 to 0.5 ml. volume of Isovue-250 was injected and a partial facet joint arthrogram was obtained. A single spot film was obtained of the resulting arthrogram.    One to 1.25 ml of the steroid/anesthetic solution was then injected into each of the facet joints noted above.   Additional Comments:  The patient tolerated the procedure well Dressing: Band-Aid    Post-procedure details: Patient was observed  during the procedure. Post-procedure instructions were reviewed.  Patient left the clinic in stable condition.    Clinical History: No specialty comments available.   She reports that she has never smoked. She has never used smokeless tobacco. No results for input(s): HGBA1C, LABURIC in the last 8760 hours.  Objective:  VS:  HT:    WT:    BMI:     BP:105/69  HR:74bpm  TEMP:98.4 F (36.9 C)(Oral)  RESP:  Physical Exam  Ortho Exam Imaging: No results found.  Past Medical/Family/Surgical/Social History: Medications & Allergies reviewed per EMR, new medications updated. Patient Active Problem List   Diagnosis Date Noted  . Acute encephalopathy 05/15/2017  . UTI (urinary tract infection) 05/15/2017  . Hypotension 02/10/2017  . 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 Malignant neoplasm of base of tongue ([place]) 10/29/2016  . 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 Headache associated with sexual activity 05/14/2016  . Tongue lesion 05/14/2016  . Loose stools 03/12/2016  . Hypocalcemia 01/22/2016  . Thrombocytopenia ([place]) 01/22/2016  . Chronic diarrhea 01/22/2016  . Chest pain   . Essential hypertension   . Orthostatic hypotension   . Spinal stenosis of lumbar region 01/20/2016    Class: Chronic  . DDD (degenerative disc disease), lumbar 01/20/2016    Class: Chronic  . Spinal stenosis of lumbar region with neurogenic claudication 01/20/2016  . Low back pain 12/22/2015  . Pre-op examination 12/22/2015  . Hypersomnolence 10/13/2015  . Muscle cramp 08/01/2015  . GERD (gastroesophageal reflux disease) 07/09/2015  . Excessive daytime sleepiness 06/18/2015  . Shingles 08/28/2013  . Postop check 08/28/2013  . Cholelithiasis 07/10/2013  . Colitis 01/08/2013  . Postoperative anemia 01/08/2013  . Morbid obesity with BMI of 40.0-44.9, adult ([place]) 01/08/2013   Past Medical History:  Diagnosis Date  . Allergy   . Anemia    Iron deficiency  . Anxiety   . Arthritis    back, left shoulder  . Cancer ([place])   . Chicken pox   . Depression   . Diarrhea    takes Imodium daily  . GERD (gastroesophageal reflux disease)   . H/O hiatal hernia   . Headache(784.0)   . History of kidney stones    1996ish  . History of radiation therapy 11/16/16- 01/01/17   Base of Tongue/ 66 gy  in 33 fractions/ Dose: 2 Gy  . Hypertension   . Migraine    none for 5 years (as of 01/13/16)  . OSA (obstructive sleep apnea) 10/13/2015   unable to get cpap, plans to get one in 2018  . Pneumonia   . Restless legs   . Scoliosis   . Shortness of breath    with exertion   Family History  Problem Relation Age of Onset  . Heart disease Father   . Heart attack Father   . Hypertension Mother   . Arthritis Mother   . Diabetes Maternal Grandmother   . Diabetes Paternal Grandmother   . Diabetes Maternal Uncle    Past Surgical History:  Procedure Laterality Date  . BACK SURGERY  07/21/1978  . CHOLECYSTECTOMY N/A 08/15/2013   Procedure: LAPAROSCOPIC CHOLECYSTECTOMY WITH INTRAOPERATIVE CHOLANGIOGRAM;  Surgeon: Gwenyth Ober, MD;  Location: [place];  Service: General;  Laterality: N/A;  . COLONOSCOPY N/A 01/09/2013   Procedure: COLONOSCOPY;  Surgeon: Beryle Beams, MD;  Location: [place];  Service: Endoscopy;  Laterality: N/A;  . DIRECT LARYNGOSCOPY  07/2016   Dr. Nicolette Bang  [facility]  . GASTROSTOMY TUBE PLACEMENT  09/27/2016  . HERNIA REPAIR Left 1981  . IR PATIENT EVAL TECH 0-60 MINS  12/13/2016  . IR PATIENT EVAL TECH 0-60 MINS  04/11/2017  . MODIFIED RADICAL NECK DISSECTION Left 09/27/2016   Levels 1 & 2  . PARTIAL GLOSSECTOMY Left 09/27/2016   Left hemi partial glossectomy  . SPINE SURGERY  01/20/2016   fusion  . TONSILLECTOMY    . tracheotomy  09/27/2016  . TUBAL LIGATION  06/1988   Social History   Occupational History  . Occupation: Air cabin crew  Tobacco Use  . Smoking status: Never Smoker  . Smokeless tobacco: Never Used  Substance and Sexual Activity  . Alcohol use: Yes    Alcohol/week: 1.0 standard drinks    Types: 1 Cans of beer per week  . Drug use: No  . Sexual activity: Yes    Birth control/protection: Surgical

## 2017-09-30 NOTE — Procedures (Signed)
Lumbar Facet Joint Intra-Articular Injection(s) with Fluoroscopic Guidance  Patient: Kristen [name]      Date of Birth: [birth year] MRN: [id number] PCP: [name], MD      Visit Date: 09/27/2017   Universal Protocol:    Date/Time: 09/27/2017  Consent Given By: the patient  Position: PRONE   Additional Comments: Vital signs were monitored before and after the procedure. Patient was prepped and draped in the usual sterile fashion. The correct patient, procedure, and site was verified.   Injection Procedure Details:  Procedure Site One Meds Administered:  Meds ordered this encounter  Medications  . methylPREDNISolone acetate (DEPO-MEDROL) injection 40 mg     Laterality: Right  Location/Site:  L5-S1  Needle size: 22 guage  Needle type: Spinal  Needle Placement: Articular  Findings:  -Comments: Excellent flow of contrast producing a partial arthrogram.  Procedure Details: The fluoroscope beam is vertically oriented in AP, and the inferior recess is visualized beneath the lower pole of the inferior apophyseal process, which represents the target point for needle insertion. When direct visualization is difficult the target point is located at the medial projection of the vertebral pedicle. The region overlying each aforementioned target is locally anesthetized with a 1 to 2 ml. volume of 1% Lidocaine without Epinephrine.   The spinal needle was inserted into each of the above mentioned facet joints using biplanar fluoroscopic guidance. A 0.25 to 0.5 ml. volume of Isovue-250 was injected and a partial facet joint arthrogram was obtained. A single spot film was obtained of the resulting arthrogram.    One to 1.25 ml of the steroid/anesthetic solution was then injected into each of the facet joints noted above.   Additional Comments:  The patient tolerated the procedure well Dressing: Band-Aid    Post-procedure details: Patient was observed during the  procedure. Post-procedure instructions were reviewed.  Patient left the clinic in stable condition.

## 2017-10-03 ENCOUNTER — Encounter (INDEPENDENT_AMBULATORY_CARE_PROVIDER_SITE_OTHER): Payer: Self-pay | Admitting: Physical Medicine and Rehabilitation

## 2017-10-03 NOTE — Telephone Encounter (Signed)
Patient last seen in office 7.17.19 by CDY: Patient Instructions  Script printed for Adderall 10 mg, take up to 3 daily if needed for excessive sleepiness. Ok to skip if not needed.   Please call as needed   Patient is scheduled for follow up on 10.21.19 w/ CDY E-mail from patient requesting refill but she did not specify the medication name/strength E-mail sent back to patient requesting this information

## 2017-10-05 ENCOUNTER — Other Ambulatory Visit: Payer: Self-pay | Admitting: Family Medicine

## 2017-10-05 ENCOUNTER — Encounter: Payer: Self-pay | Admitting: Family Medicine

## 2017-10-05 DIAGNOSIS — K449 Diaphragmatic hernia without obstruction or gangrene: Secondary | ICD-10-CM

## 2017-10-05 DIAGNOSIS — I10 Essential (primary) hypertension: Secondary | ICD-10-CM

## 2017-10-05 DIAGNOSIS — K219 Gastro-esophageal reflux disease without esophagitis: Secondary | ICD-10-CM

## 2017-10-06 NOTE — Telephone Encounter (Signed)
E-mail received from patient requesting refill on Adderall 15m Previous e-mail 9.16.19 stated that patient would like this mailed to her  Patient last seen in office 7.17.19 by CDY: Patient Instructions  Script printed for Adderall 10 mg, take up to 3 daily if needed for excessive sleepiness. Ok to skip if not needed.   Please call as needed    Patient is scheduled for follow up on 10.21.19 w/ CDY Medication last rx'd 8.20.19  Dr YAnnamaria Bootsplease advise if okay to refill the Adderall 119m thank you.

## 2017-10-07 ENCOUNTER — Telehealth: Payer: Self-pay | Admitting: Internal Medicine

## 2017-10-07 MED ORDER — AMPHETAMINE-DEXTROAMPHETAMINE 10 MG PO TABS: ORAL_TABLET | ORAL | 0 refills | Status: DC

## 2017-10-07 NOTE — Telephone Encounter (Signed)
Ok to refill as before

## 2017-10-07 NOTE — Telephone Encounter (Signed)
Refill medication patient made aware by e-mail.

## 2017-10-10 ENCOUNTER — Telehealth: Payer: Self-pay | Admitting: Nutrition

## 2017-10-10 NOTE — Telephone Encounter (Signed)
Patient contacted me for TF samples. Patient is s/p treatment 8 months. She is able to eat some but has not advanced diet completely. Educated patient to switch to Ensure Plus or equivalent by mouth so tube could eventually be removed. She is able to swallow without difficulty. Patient is agreeable and denies barriers to oral intake.

## 2017-10-11 ENCOUNTER — Encounter: Payer: Self-pay | Admitting: Family Medicine

## 2017-10-14 ENCOUNTER — Ambulatory Visit (INDEPENDENT_AMBULATORY_CARE_PROVIDER_SITE_OTHER): Payer: BC Managed Care – PPO | Admitting: Family Medicine

## 2017-10-14 ENCOUNTER — Encounter: Payer: Self-pay | Admitting: Family Medicine

## 2017-10-14 VITALS — BP 102/82 | HR 84 | Temp 98.2°F | Wt 171.0 lb

## 2017-10-14 DIAGNOSIS — N3000 Acute cystitis without hematuria: Secondary | ICD-10-CM | POA: Diagnosis not present

## 2017-10-14 LAB — POC URINALSYSI DIPSTICK (AUTOMATED)
BILIRUBIN UA: NEGATIVE
Glucose, UA: NEGATIVE
KETONES UA: NEGATIVE
NITRITE UA: NEGATIVE
PH UA: 6.5 (ref 5.0–8.0)
Protein, UA: NEGATIVE
RBC UA: NEGATIVE
Spec Grav, UA: 1.015 (ref 1.010–1.025)
Urobilinogen, UA: 1 E.U./dL

## 2017-10-14 MED ORDER — NITROFURANTOIN MONOHYD MACRO 100 MG PO CAPS: 100.0000 mg | ORAL_CAPSULE | Freq: Two times a day (BID) | ORAL | 0 refills | Status: AC

## 2017-10-14 NOTE — Patient Instructions (Signed)

## 2017-10-14 NOTE — Progress Notes (Signed)
Subjective:    Patient ID: Kristen [name], female    DOB: Jan 07, 1966, 52 y.o.   MRN: [id number]  No chief complaint on file.   HPI Patient was seen today for acute concern.  Pt endorses urinary urgency, sleepiness, right groin pain which started a few days ago.  Pt had similar symptoms when a UTI started.  Pt denies fever, chills, nausea, vomiting, back pain.  Pt still using feeding tube to supplement her dietary intake.  States has difficulty affording Osmolite as the cost for 6 cases over $200.  Pt also having right hip pain which makes it difficult to sit.  Is being followed by Ortho, injections did not work.  Pt trying to avoid surgery.  Past Medical History:  Diagnosis Date  . Allergy   . Anemia    Iron deficiency  . Anxiety   . Arthritis    back, left shoulder  . Cancer ([place])   . Chicken pox   . Depression   . Diarrhea    takes Imodium daily  . GERD (gastroesophageal reflux disease)   . H/O hiatal hernia   . Headache(784.0)   . History of kidney stones    1996ish  . History of radiation therapy 11/16/16- 01/01/17   Base of Tongue/ 66 gy in 33 fractions/ Dose: 2 Gy  . Hypertension   . Migraine    none for 5 years (as of 01/13/16)  . OSA (obstructive sleep apnea) 10/13/2015   unable to get cpap, plans to get one in 2018  . Pneumonia   . Restless legs   . Scoliosis   . Shortness of breath    with exertion    Allergies  Allergen Reactions  . Other Hives and Swelling    tomato  . Sulfur Hives and Swelling    ROS General: Denies fever, chills, night sweats, changes in weight, changes in appetite  + fatigue, sleepiness HEENT: Denies headaches, ear pain, changes in vision, rhinorrhea, sore throat CV: Denies CP, palpitations, SOB, orthopnea Pulm: Denies SOB, cough, wheezing GI: Denies abdominal pain, nausea, vomiting, diarrhea, constipation GU: Denies dysuria, hematuria, frequency, vaginal discharge   + urgency Msk: Denies muscle cramps, joint pains  + right  groin pain Neuro: Denies weakness, numbness, tingling Skin: Denies rashes, bruising Psych: Denies depression, anxiety, hallucinations     Objective:    Blood pressure 102/82, pulse 84, temperature 98.2 F (36.8 C), temperature source Oral, weight 171 lb (77.6 kg), last menstrual period 11/15/2013, SpO2 92 %.   Gen. Pleasant, well-nourished, in no distress, normal affect   Lungs: no accessory muscle use Cardiovascular: RRR, no m/r/g, no peripheral edema Abdomen: BS present, soft, NT/ND Neuro:  A&Ox3, CN II-XII intact, normal gait   Wt Readings from Last 3 Encounters:  10/14/17 171 lb (77.6 kg)  09/23/17 175 lb 12.8 oz (79.7 kg)  08/04/17 185 lb (83.9 kg)    Lab Results  Component Value Date   WBC 3.5 (L) 07/27/2017   HGB 11.6 (L) 07/27/2017   HCT 35.5 (L) 07/27/2017   PLT 171.0 07/27/2017   GLUCOSE 95 07/27/2017   ALT 52 05/15/2017   AST 62 (H) 05/15/2017   NA 146 (H) 07/27/2017   K 4.3 07/27/2017   CL 107 07/27/2017   CREATININE 0.80 07/27/2017   BUN 17 07/27/2017   CO2 31 07/27/2017   TSH 1.40 07/27/2017   INR 0.95 07/22/2017   HGBA1C 5.6 06/18/2015    Assessment/Plan:  Acute cystitis without hematuria  -UA with  1+ leuks, 1+ urobilinogen, SG 1.015 -We will send for UCx - Plan: nitrofurantoin, macrocrystal-monohydrate, (MACROBID) 100 MG capsule, POCT Urinalysis Dipstick (Automated), Urine Culture -Given RTC or ED precautions for worsening symptoms including fever, chills, nausea, vomiting, back pain, etc.  Follow-up PRN  Grier Mitts, MD

## 2017-10-15 LAB — URINE CULTURE
MICRO NUMBER:: 91164409
Result:: NO GROWTH
SPECIMEN QUALITY:: ADEQUATE

## 2017-10-17 ENCOUNTER — Inpatient Hospital Stay: Payer: BC Managed Care – PPO

## 2017-10-21 ENCOUNTER — Encounter (INDEPENDENT_AMBULATORY_CARE_PROVIDER_SITE_OTHER): Payer: Self-pay | Admitting: Physical Medicine and Rehabilitation

## 2017-10-21 ENCOUNTER — Encounter (INDEPENDENT_AMBULATORY_CARE_PROVIDER_SITE_OTHER): Payer: Self-pay | Admitting: Specialist

## 2017-10-21 MED ORDER — TRAMADOL HCL 50 MG PO TABS: ORAL_TABLET | ORAL | 0 refills | Status: DC

## 2017-10-21 NOTE — Telephone Encounter (Signed)
Patient requests refill on Tramadol.  Verbal order given by Dr. Louanne Skye, ok to refill. Refill entered and called to patient's pharmacy.

## 2017-10-24 ENCOUNTER — Ambulatory Visit: Payer: BC Managed Care – PPO | Attending: Radiation Oncology

## 2017-10-24 DIAGNOSIS — R1312 Dysphagia, oropharyngeal phase: Secondary | ICD-10-CM | POA: Diagnosis not present

## 2017-10-24 NOTE — Therapy (Signed)
[facility] [address] [place], [place], [address] Phone: [phone]   Fax:  [phone]  Speech Language Pathology Treatment  Patient Details  Name: Kristen [name] MRN: [id number] Date of Birth: 21-Feb-1965 Referring Provider (SLP): Eppie Gibson, MD   Encounter Date: 10/24/2017  End of Session - 10/24/17 1441    Visit Number  7    Number of Visits  7    Date for SLP Re-Evaluation  10/24/17    SLP Start Time  1122    SLP Stop Time   1200    SLP Time Calculation (min)  38 min    Activity Tolerance  Patient tolerated treatment well       Past Medical History:  Diagnosis Date  . Allergy   . Anemia    Iron deficiency  . Anxiety   . Arthritis    back, left shoulder  . Cancer ([place])   . Chicken pox   . Depression   . Diarrhea    takes Imodium daily  . GERD (gastroesophageal reflux disease)   . H/O hiatal hernia   . Headache(784.0)   . History of kidney stones    1996ish  . History of radiation therapy 11/16/16- 01/01/17   Base of Tongue/ 66 gy in 33 fractions/ Dose: 2 Gy  . Hypertension   . Migraine    none for 5 years (as of 01/13/16)  . OSA (obstructive sleep apnea) 10/13/2015   unable to get cpap, plans to get one in 2018  . Pneumonia   . Restless legs   . Scoliosis   . Shortness of breath    with exertion    Past Surgical History:  Procedure Laterality Date  . BACK SURGERY  07/21/1978  . CHOLECYSTECTOMY N/A 08/15/2013   Procedure: LAPAROSCOPIC CHOLECYSTECTOMY WITH INTRAOPERATIVE CHOLANGIOGRAM;  Surgeon: Gwenyth Ober, MD;  Location: [facility];  Service: General;  Laterality: N/A;  . COLONOSCOPY N/A 01/09/2013   Procedure: COLONOSCOPY;  Surgeon: Beryle Beams, MD;  Location: [facility];  Service: Endoscopy;  Laterality: N/A;  . DIRECT LARYNGOSCOPY  07/2016   Dr. Nicolette Bang [facility]  . GASTROSTOMY TUBE PLACEMENT  09/27/2016  . HERNIA REPAIR Left 1981  . IR PATIENT EVAL TECH 0-60 MINS  12/13/2016  .  IR PATIENT EVAL TECH 0-60 MINS  04/11/2017  . MODIFIED RADICAL NECK DISSECTION Left 09/27/2016   Levels 1 & 2  . PARTIAL GLOSSECTOMY Left 09/27/2016   Left hemi partial glossectomy  . SPINE SURGERY  01/20/2016   fusion  . TONSILLECTOMY    . tracheotomy  09/27/2016  . TUBAL LIGATION  06/1988    There were no vitals filed for this visit.  Subjective Assessment - 10/24/17 1134    Subjective  Pt thinks her major barrier to PEG removal is finding food she can eat without teeth. She plans to get dentures 01-18-18 due to financial concerns.     Currently in Pain?  No/denies            ADULT SLP TREATMENT - 10/24/17 1136      General Information   Behavior/Cognition  Cooperative;Pleasant mood;Alert      Treatment Provided   Treatment provided  Dysphagia      Dysphagia Treatment   Temperature Spikes Noted  Yes    Respiratory Status  Room air    Oral Cavity - Dentition  Edentulous    Treatment Methods  Skilled observation;Therapeutic exercise;Patient/caregiver education    Patient observed directly with  PO's  Yes    Type of PO's observed  Dysphagia 3 (soft);Thin liquids    Oral Phase Signs & Symptoms  Prolonged mastication    Pharyngeal Phase Signs & Symptoms  --   none noted   Other treatment/comments  Pt trying to have PO protein supplements but GI system cannot tolerate. Taste aversions cont - pt having difficulty finding variety of foods she likes and can eat without dentures. SLP told pt she may need to have POs she can tolerate - given her taste aversions, in order to have caloric intake necessary to maintain weight without PEG. Pt stated her goal is to get PEG out by 01-18-18. SLP provided rare min A with HEP today, reiterated regular completion. Told pt if incr'd instance of oral or pharyngeal dysphagia she should talk to her PCP, rad onc (if still following), or her ENT.       Assessment / Recommendations / Plan   Plan  --   d/c - pt met goals     Dysphagia Recommendations    Diet recommendations  Thin liquid;Dysphagia 3 (mechanical soft)    Liquids provided via  Cup    Medication Administration  Crushed with puree   or whole with puree   Supervision  Patient able to self feed    Compensations  Slow rate;Small sips/bites;Multiple dry swallows after each bite/sip;Follow solids with liquid      Progression Toward Goals   Progression toward goals  --   see goals - d/c day      SLP Education - 10/24/17 1440    Education provided  Yes    Education Details  pt is safe for dys III/thin, needs to eat things she might not necessarily ENJOY    Person(s) Educated  Patient    Methods  Explanation    Comprehension  Verbalized understanding       SLP Short Term Goals - 05/23/17 1400      SLP SHORT TERM GOAL #1   Title  pt will complete HEP with rare min A over two sessions    Status  Partially Met   one visist     SLP SHORT TERM GOAL #2   Title  pt will tell SLP why she is completing HEP     Status  Achieved      SLP SHORT TERM GOAL #3   Title  pt will tell why a food journal can be helpful in returning to pre-radiation diet    Status  Deferred      SLP SHORT TERM GOAL #4   Title  pt will provide SLP 3 overt s/s aspiration PNA with modified independence    Status  Achieved       SLP Long Term Goals - 10/24/17 1445      SLP LONG TERM GOAL #1   Title  pt will complete HEP with modified independence over three sessions     Period  --   visits   Status  Achieved      SLP LONG TERM GOAL #2   Title  pt will tell SLP when HEP frequency can be reduced to x2-3/week    Status  Achieved      SLP LONG TERM GOAL #3   Title  pt will tell SLP 3 overt s/s of aspiration PNA with modified independence over three sessions    Status  Achieved      SLP LONG TERM GOAL #4   Title  pt will tell  how food journal can assist return to more normal POs    Status  Achieved      SLP LONG TERM GOAL #5   Title  pt will perform HEP for swallowing with SBA from SLP  after approx 8 weeks without ST    Status  Partially Met   (rare min A)      Plan - 10/24/17 1442    Clinical Impression Statement  Pt with WNL swallowing ability for at least dys III/thin. However pt reports 3 cans tube feeding daily. Pt reports she has been completing HEP with suboptimal frequency and SLP reminded pt to incr to twice a day. The probability of swallowing difficulty remains possible with the completion of radiation therapy and pt noncompliance. Pt does not report any overt s/s aspiration PNA to date. Pt will be seen today for work on above goals, and then is appropriate for d/c at this time due to WNL swallow ability for oat least dys III/thin and for WFL/WNL completion of HEP.     Duration  --   1 visit   Treatment/Interventions  Aspiration precaution training;Pharyngeal strengthening exercises;Diet toleration management by SLP;Trials of upgraded texture/liquids;Internal/external aids;Patient/family education;Compensatory strategies;SLP instruction and feedback;Cueing hierarchy;Environmental controls    Potential to Achieve Goals  Good       Patient will benefit from skilled therapeutic intervention in order to improve the following deficits and impairments:   Dysphagia, oropharyngeal   SPEECH THERAPY RENEWAL/DISCHARGE SUMMARY  Visits from Start of Care: 7   Current functional level related to goals / functional outcomes: Pt is seen for today's visit and then d/c'd. Pt has remained not fully-compliant with frequency or scope of HEP over the last two-three visits. Pt is safe for at least dys III/thin at this time, but cont to have 3 cans tube feeding/day due to taste aversions and endentulous status. "I just gotta find somethings I like to eat" pt stated. Pt performs HEP with rare min A and is satisfied with her current safety with swallowing.    Remaining deficits: Minimal dysphagia.   Education / Equipment: HEP procedure, late effects head/neck radiation on  swallowing ability.  Plan: Patient agrees to discharge.  Patient goals were partially met. Patient is being discharged due to                                                     ?????being safe with least restrictive diet and completing HEP with good success.       Problem List Patient Active Problem List   Diagnosis Date Noted  . Acute encephalopathy 05/15/2017  . UTI (urinary tract infection) 05/15/2017  . Hypotension 02/10/2017  . 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 Malignant neoplasm of base of tongue ([place]) 10/29/2016  . 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[place]) 10/29/2016  . Headache associated with sexual activity 05/14/2016  . Tongue lesion 05/14/2016  . Loose stools 03/12/2016  . Hypocalcemia 01/22/2016  . Thrombocytopenia ([place]) 01/22/2016  . Chronic diarrhea 01/22/2016  . Chest pain   . Essential hypertension   . Orthostatic hypotension   . Spinal stenosis of lumbar region 01/20/2016    Class: Chronic  . DDD (degenerative disc disease), lumbar 01/20/2016    Class: Chronic  . Spinal stenosis of lumbar region with neurogenic claudication 01/20/2016  .  Low back pain 12/22/2015  . Pre-op examination 12/22/2015  . Hypersomnolence 10/13/2015  . Muscle cramp 08/01/2015  . GERD (gastroesophageal reflux disease) 07/09/2015  . Excessive daytime sleepiness 06/18/2015  . Shingles 08/28/2013  . Postop check 08/28/2013  . Cholelithiasis 07/10/2013  . Colitis 01/08/2013  . Postoperative anemia 01/08/2013  . Morbid obesity with BMI of 40.0-44.9, adult ([place]) 01/08/2013    [facility] ,[place], [place]  10/24/2017, 2:46 PM  [facility] [address] [place], [place], [address] Phone: [phone]   Fax:  [phone]   Name: Kristen [name] MRN: [id number] Date of Birth: 20-Oct-1965

## 2017-10-24 NOTE — Patient Instructions (Signed)
If you have trouble eating or drinking at all, contact your PCP or your ENT. You could also tell Dr. Isidore Moos, if she is still following you at that time.   Talk to [facility] about questions you may have about WHAT to eat.

## 2017-10-27 ENCOUNTER — Ambulatory Visit (INDEPENDENT_AMBULATORY_CARE_PROVIDER_SITE_OTHER): Payer: BC Managed Care – PPO | Admitting: Internal Medicine

## 2017-10-27 ENCOUNTER — Encounter: Payer: Self-pay | Admitting: Internal Medicine

## 2017-10-27 VITALS — BP 122/78 | HR 70 | Ht 69.0 in | Wt 176.4 lb

## 2017-10-27 DIAGNOSIS — K219 Gastro-esophageal reflux disease without esophagitis: Secondary | ICD-10-CM | POA: Diagnosis not present

## 2017-10-27 DIAGNOSIS — K529 Noninfective gastroenteritis and colitis, unspecified: Secondary | ICD-10-CM | POA: Diagnosis not present

## 2017-10-27 DIAGNOSIS — R1312 Dysphagia, oropharyngeal phase: Secondary | ICD-10-CM

## 2017-10-27 MED ORDER — DIPHENOXYLATE-ATROPINE 2.5-0.025 MG PO TABS: ORAL_TABLET | ORAL | 2 refills | Status: DC

## 2017-10-27 NOTE — Assessment & Plan Note (Signed)
Dc viberzi Lomotil colonoscopy

## 2017-10-27 NOTE — Assessment & Plan Note (Signed)
EGD - reflux despite PPI

## 2017-10-27 NOTE — Patient Instructions (Addendum)
  Stop the Vibertz per Dr Carlean Purl.   We are giving you a printed rx for Lomotil to take to the pharmacy, this replaces the [place].   You have been scheduled for an endoscopy and colonoscopy. Please follow the written instructions given to you at your visit today. Please prep thru your gastromy tube. Please pick up your prep supplies at the pharmacy within the next 1-3 days. If you use inhalers (even only as needed), please bring them with you on the day of your procedure.    I appreciate the opportunity to care for you. Silvano Rusk, MD, [facility]

## 2017-10-27 NOTE — Progress Notes (Signed)
Kristen [name] 52 y.o. 05/19/1965 [id number] Referred by: [name], MD  Assessment & Plan:   Encounter Diagnoses  Name Primary?  . Chronic diarrhea   . Gastroesophageal reflux disease, esophagitis presence not specified   . Esophageal dysphagia Yes     Viberzi is discontinued it is contraindicated in somebody who is status post cholecystectomy due to an increased risk of pancreatitis from sphincter of Oddi spasm. Lomotil as needed for diarrhea at this point while I work this up further   Schedule upper GI endoscopy and colonoscopy.  Evaluate diarrhea and reflux problems that occur despite PPI therapy.  I do not think she is having esophageal dysphagia but will clarify that again when she returns.  Sitter random colon biopsies since she has terrible diarrhea if she does not take Viberzi.  Vertebral bowel syndrome seems likely but inflammatory bowel disease microscopic colitis is possible also.  I appreciate the opportunity to care for this patient. CC: [name], MD  Subjective:   Chief Complaint: Hiatal hernia, bad indigestion  HPI The patient is a pleasant 52 year old woman with a history of tongue cancer treated with radiation and I think chemotherapy in 2018, who has done pretty well with that but has chronic diarrhea problems treated with Viberzi but that is now causing constipation, and heartburn several times a week and intermittent chest pain problems despite Protonix.  She has difficulty swallowing related to her tongue cancer and that seems relatively stable.  She denies clear-cut food impactions.  Imodium has helped her diarrhea but she was having to take it every 4 hours for relief.  She does complain of some bright red blood per rectum with very hard stools.  She has a gastrostomy tube in place to support her during her treatment, and continues using that with nutritional supplements.  The gastrostomy tube seems to work well.  Problems with that.  Other  complaints include belching bloating. Allergies  Allergen Reactions  . Other Hives and Swelling    tomato  . Sulfur Hives and Swelling   Current Meds  Medication Sig  . amLODipine (NORVASC) 10 MG tablet TAKE 1 TABLET BY MOUTH DAILY  . amphetamine-dextroamphetamine (ADDERALL) 10 MG tablet 1-3 daily as needed for sleepiness  . aspirin 81 MG chewable tablet Place 81 mg into feeding tube daily.   . baclofen (LIORESAL) 10 MG tablet Place 10 mg into feeding tube 3 (three) times daily.   . calcium carbonate (OSCAL) 1500 (600 Ca) MG TABS tablet 600 mg of elemental calcium 2 (two) times daily with a meal. Per tube  . diclofenac (VOLTAREN) 75 MG EC tablet TAKE 1 TABLET BY MOUTH TWICE DAILY WITH FOOD AS NEEDED FOR PAIN  . Eluxadoline (VIBERZI) 100 MG TABS Take 1 tablet (100 mg total) by mouth 2 (two) times daily.  . Eluxadoline (VIBERZI) 100 MG TABS Take 1 tablet (100 mg total) by mouth 2 (two) times daily. (Patient taking differently: Place 100 mg into feeding tube 2 (two) times daily. )  . ergocalciferol (DRISDOL) 8000 UNIT/ML drops Place 6 mLs (48,000 Units total) into feeding tube once a week.  . gabapentin (NEURONTIN) 300 MG capsule Take 1 capsule (300 mg total) by mouth 3 (three) times daily. (Patient taking differently: Place 300 mg into feeding tube 3 (three) times daily. )  . LORazepam (ATIVAN) 0.5 MG tablet Take 1 tab PRN nausea. Do not take more than 3 pills daily.  . pantoprazole (PROTONIX) 40 MG tablet TAKE 1 TABLET BY  MOUTH ONCE DAILY  . Potassium 99 MG TABS 1 tablet (99 mg total) by Gastrostomy Tube route daily. (Patient taking differently: Place 1 tablet into feeding tube daily. )  . SUMAtriptan (IMITREX) 100 MG tablet Take 0.5-1 tablet by mouth at the onset of migraine and may repeat in 2 hours if headache persists or recurs. (Patient taking differently: Place 100 mg into feeding tube every 2 (two) hours as needed for migraine or headache. )  . traMADol (ULTRAM) 50 MG tablet Take 1  tablet (50 mg total) by mouth every 6 (six) hours as needed. for pain   Past Medical History:  Diagnosis Date  . Allergy   . Anemia    Iron deficiency  . Anxiety   . Arthritis    back, left shoulder  . Cancer ([place])   . Chicken pox   . Depression   . Diarrhea    takes Imodium daily  . GERD (gastroesophageal reflux disease)   . H/O hiatal hernia   . Headache(784.0)   . History of kidney stones    1996ish  . History of radiation therapy 11/16/16- 01/01/17   Base of Tongue/ 66 gy in 33 fractions/ Dose: 2 Gy  . Hypertension   . Migraine    none for 5 years (as of 01/13/16)  . OSA (obstructive sleep apnea) 10/13/2015   unable to get cpap, plans to get one in 2018  . Pneumonia   . Restless legs   . Scoliosis   . Shortness of breath    with exertion   Past Surgical History:  Procedure Laterality Date  . BACK SURGERY  07/21/1978  . CHOLECYSTECTOMY N/A 08/15/2013   Procedure: LAPAROSCOPIC CHOLECYSTECTOMY WITH INTRAOPERATIVE CHOLANGIOGRAM;  Surgeon: Gwenyth Ober, MD;  Location: [facility];  Service: General;  Laterality: N/A;  . COLONOSCOPY N/A 01/09/2013   Procedure: COLONOSCOPY;  Surgeon: Beryle Beams, MD;  Location: [facility];  Service: Endoscopy;  Laterality: N/A;  . DIRECT LARYNGOSCOPY  07/2016   Dr. Nicolette Bang [facility]  . GASTROSTOMY TUBE PLACEMENT  09/27/2016  . HERNIA REPAIR Left 1981  . IR PATIENT EVAL TECH 0-60 MINS  12/13/2016  . IR PATIENT EVAL TECH 0-60 MINS  04/11/2017  . MODIFIED RADICAL NECK DISSECTION Left 09/27/2016   Levels 1 & 2  . PARTIAL GLOSSECTOMY Left 09/27/2016   Left hemi partial glossectomy  . SPINE SURGERY  01/20/2016   fusion  . TONSILLECTOMY    . tracheotomy  09/27/2016  . TUBAL LIGATION  06/1988   Social History   Social History Narrative   Fun: Play with her grandkids, read   family history includes Arthritis in her mother; Diabetes in her maternal grandmother, maternal uncle, and paternal grandmother; Heart attack in her father; Heart disease  in her father; Hypertension in her mother.   Review of Systems As per HPI.  He has been getting treated at [facility] for right hip and leg pain receiving sacroiliac joint injections.  All other review of systems appear negative at this time.  Objective:   Physical Exam @BP  122/78   Pulse 70   Ht 5' 9"  (1.753 m)   Wt 176 lb 6 oz (80 kg)   LMP 11/15/2013   BMI 26.05 kg/m @  General:  Well-developed, well-nourished and in no acute distress thin Eyes:  anicteric. ENT:   S/p tongue resection  Neck:   supple w/o thyromegaly or mass. - right deformity s/p RND, trach scar Lungs: Clear to auscultation bilaterally. Heart:  S1S2,  no rubs, murmurs, gallops. Abdomen:  soft, non-tender, no hepatosplenomegaly, hernia, or mass and BS+. Gastrostomy LUQ Rectal: deferred Lymph:  no cervical or supraclavicular adenopathy. Extremities:   no edema, cyanosis or clubbing Skin   no rash. Neuro:  A&O x 3.  Psych:  appropriate mood and  Affect.  Sacral spine facet joint injections Data Reviewed: See HPI.  I reviewed imaging labs in the EMR primary care notes radiation therapy notes. I hemoglobin 11.6

## 2017-11-07 ENCOUNTER — Encounter: Payer: Self-pay | Admitting: Internal Medicine

## 2017-11-07 ENCOUNTER — Ambulatory Visit (INDEPENDENT_AMBULATORY_CARE_PROVIDER_SITE_OTHER): Payer: BC Managed Care – PPO | Admitting: Internal Medicine

## 2017-11-07 DIAGNOSIS — G4719 Other hypersomnia: Secondary | ICD-10-CM

## 2017-11-07 MED ORDER — AMPHETAMINE-DEXTROAMPHETAMINE 10 MG PO TABS: ORAL_TABLET | ORAL | 0 refills | Status: DC

## 2017-11-07 NOTE — Progress Notes (Signed)
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  Does not have sleep apnea.  May eventually benefit from NPSG/ MSLT.  For now we will see if we can meet her needs with an appropriate stimulant, used with care.  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  She reports that Adderall tablets have significantly improved control of her daytime sleepiness.  Denies associated mood change or palpitation.  Still occasional nap.  No problems with driving. Usually taking 1 or sometimes 2 tablets.  Needs refill now.  Discussed controlled status again.  ROS-see HPI   + = positive Constitutional:    weight loss, night sweats, fevers, chills, fatigue, lassitude. HEENT:    headaches, difficulty swallowing, tooth/dental problems, sore throat,       sneezing, itching, ear ache, nasal congestion, post nasal drip,  snoring CV:    chest pain, orthopnea, PND, swelling in lower extremities, anasarca,                                                     dizziness, palpitations Resp:   shortness of breath with exertion or at rest.                productive cough,   non-productive cough, coughing up of blood.              change in color of mucus.  wheezing.   Skin:    rash or lesions. GI:  No-   heartburn, indigestion, abdominal pain, nausea, vomiting, diarrhea,                 change in bowel habits, loss of appetite GU: dysuria, change in color of urine, no urgency or frequency.   flank pain. MS:   joint pain, stiffness, decreased range of motion, back pain. Neuro-     nothing unusual Psych:  change in mood or affect.  depression or anxiety.   memory loss.  OBJ- Physical Exam General- Alert, Oriented, Affect-appropriate, Distress- none acute,  Skin- rash-none, lesions- none, excoriation- none Lymphadenopathy- none Head- atraumatic            Eyes- Gross vision intact, PERRLA, conjunctivae and secretions clear            Ears- Hearing, canals-normal            Nose- Clear, no-Septal dev, mucus, polyps, erosion, perforation             Throat- Mallampati III-IV , mucosa clear , drainage- none, tonsils- atrophic, + edentulous                       +  speech is fairly clear.  I did not see obvious surgical changes. Neck- flexible , trachea midline, no stridor , thyroid nl, carotid no bruit Chest - symmetrical excursion , unlabored           Heart/CV- RRR , no murmur , no gallop  , no rub, nl s1 s2                           - JVD- none , edema- none, stasis changes- none, varices- none           Lung- clear to P&A, wheeze- none, cough- none , dullness-none, rub- none           Chest wall- + postsurgical scars Abd-   + Gastric feeding tube Br/ Gen/ Rectal- Not done, not indicated Extrem- cyanosis- none, clubbing, none, atrophy- none, strength- nl Neuro- grossly intact to observation

## 2017-11-07 NOTE — Patient Instructions (Addendum)
Script printed for adderall 10 mg, to use up to 3 daily- try to use fewer and stretch it out  Please call if we can help

## 2017-11-08 ENCOUNTER — Encounter (INDEPENDENT_AMBULATORY_CARE_PROVIDER_SITE_OTHER): Payer: Self-pay | Admitting: Physical Medicine and Rehabilitation

## 2017-11-09 ENCOUNTER — Encounter (INDEPENDENT_AMBULATORY_CARE_PROVIDER_SITE_OTHER): Payer: Self-pay | Admitting: Specialist

## 2017-11-09 ENCOUNTER — Ambulatory Visit (INDEPENDENT_AMBULATORY_CARE_PROVIDER_SITE_OTHER): Payer: BC Managed Care – PPO | Admitting: Specialist

## 2017-11-09 VITALS — BP 117/74 | HR 72 | Ht 69.0 in | Wt 176.0 lb

## 2017-11-09 DIAGNOSIS — M960 Pseudarthrosis after fusion or arthrodesis: Secondary | ICD-10-CM

## 2017-11-09 DIAGNOSIS — M4325 Fusion of spine, thoracolumbar region: Secondary | ICD-10-CM | POA: Diagnosis not present

## 2017-11-09 MED ORDER — TRAMADOL HCL 50 MG PO TABS: 50.0000 mg | ORAL_TABLET | Freq: Four times a day (QID) | ORAL | 0 refills | Status: DC | PRN

## 2017-11-09 NOTE — Patient Instructions (Signed)
Avoid frequent bending and stooping  No lifting greater than 10 lbs. May use ice or moist heat for pain. Weight loss is of benefit.  Exercise is important to improve your indurance and does allow people to function better inspite of back pain. CT scan and myelogram of the lumbar spine is ordered to assess for nerve compression and the status of the fusion healing and the lowest disc.  May want to use tramadol intermittantly for pain and consider CBD oil capsule. [email or web address] 6000 mg capsule.  Take calcium supplements and a multiple vitamin.

## 2017-11-09 NOTE — Progress Notes (Signed)
Office Visit Note   Patient: Kristen [name]           Date of Birth: March 01, 1965           MRN: [id number] Visit Date: 11/09/2017              Requested by: [name], MD [place], [place] [address] PCP: [name], MD   Assessment & Plan: Visit Diagnoses:  1. Fusion of spine of thoracolumbar region   2. Pseudarthrosis after fusion or arthrodesis     Plan: Avoid frequent bending and stooping  No lifting greater than 10 lbs. May use ice or moist heat for pain. Weight loss is of benefit.  Exercise is important to improve your indurance and does allow people to function better inspite of back pain. CT scan and myelogram of the lumbar spine is ordered to assess for nerve compression and the status of the fusion healing and the lowest disc.  May want to use tramadol intermittantly for pain and consider CBD oil capsule. [email or web address] 6000 mg capsule.  Take calcium supplements and a multiple vitamin.  Follow-Up Instructions: No follow-ups on file.   Orders:  Orders Placed This Encounter  Procedures  . CT LUMBAR SPINE W CONTRAST  . DG Myelogram Lumbar   Meds ordered this encounter  Medications  . traMADol (ULTRAM) 50 MG tablet    Sig: Take 1 tablet (50 mg total) by mouth every 6 (six) hours as needed. for pain    Dispense:  40 tablet    Refill:  0      Procedures: No procedures performed   Clinical Data: No additional findings.   Subjective: Chief Complaint  Patient presents with  . Lower Back - Follow-up  . Right Hip - Follow-up    52 year old female with bilateral hip pain with planned hip injection on 11/4. She is having no problems with reaching her socks or shoes. No bladder difficulty or bladder difficulty. She is to see Dr. Mack Hook at [facility] . She had had colonoscopy in the past. She had a e coli colitis in the past and now is down to  176 lbs. She had radiation recently for tongue and neck cancer. MRI of the cervical  spine disclosed the finding. Radiation post resection in 09/2016.   Review of Systems   Objective: Vital Signs: BP 117/74 (BP Location: Left Arm, Patient Position: Sitting)   Pulse 72   Ht 5' 9"  (1.753 m)   Wt 176 lb (79.8 kg)   LMP 11/15/2013   BMI 25.99 kg/m   Physical Exam  Ortho Exam  Specialty Comments:  No specialty comments available.  Imaging: No results found.   PMFS History: Patient Active Problem List   Diagnosis Date Noted  . Spinal stenosis of lumbar region 01/20/2016    Priority: High    Class: Chronic  . DDD (degenerative disc disease), lumbar 01/20/2016    Priority: High    Class: Chronic  . Acute encephalopathy 05/15/2017  . UTI (urinary tract infection) 05/15/2017  . Hypotension 02/10/2017  . 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 Malignant neoplasm of base of tongue ([place]) 10/29/2016  . 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[place]) 10/29/2016  . Headache associated with sexual activity 05/14/2016  . Tongue lesion 05/14/2016  . Loose stools 03/12/2016  . Hypocalcemia 01/22/2016  . Thrombocytopenia ([place]) 01/22/2016  . Chronic diarrhea 01/22/2016  . Chest pain   .  Essential hypertension   . Orthostatic hypotension   . Spinal stenosis of lumbar region with neurogenic claudication 01/20/2016  . Low back pain 12/22/2015  . Pre-op examination 12/22/2015  . Hypersomnolence 10/13/2015  . Muscle cramp 08/01/2015  . GERD (gastroesophageal reflux disease) 07/09/2015  . Excessive daytime sleepiness 06/18/2015  . Shingles 08/28/2013  . Postop check 08/28/2013  . Cholelithiasis 07/10/2013  . Colitis 01/08/2013  . Postoperative anemia 01/08/2013  . Morbid obesity with BMI of 40.0-44.9, adult ([place]) 01/08/2013   Past Medical History:  Diagnosis Date  . Allergy   . Anemia    Iron deficiency  . Anxiety   . Arthritis    back, left shoulder  . Cancer ([place])   . Chicken pox   . Depression   . Diarrhea    takes Imodium daily    . GERD (gastroesophageal reflux disease)   . H/O hiatal hernia   . Headache(784.0)   . History of kidney stones    1996ish  . History of radiation therapy 11/16/16- 01/01/17   Base of Tongue/ 66 gy in 33 fractions/ Dose: 2 Gy  . Hypertension   . Migraine    none for 5 years (as of 01/13/16)  . OSA (obstructive sleep apnea) 10/13/2015   unable to get cpap, plans to get one in 2018  . Pneumonia   . Restless legs   . Scoliosis   . Shortness of breath    with exertion    Family History  Problem Relation Age of Onset  . Heart disease Father   . Heart attack Father   . Hypertension Mother   . Arthritis Mother   . Diabetes Maternal Grandmother   . Diabetes Paternal Grandmother   . Diabetes Maternal Uncle     Past Surgical History:  Procedure Laterality Date  . BACK SURGERY  07/21/1978  . CHOLECYSTECTOMY N/A 08/15/2013   Procedure: LAPAROSCOPIC CHOLECYSTECTOMY WITH INTRAOPERATIVE CHOLANGIOGRAM;  Surgeon: Gwenyth Ober, MD;  Location: [facility];  Service: General;  Laterality: N/A;  . COLONOSCOPY N/A 01/09/2013   Procedure: COLONOSCOPY;  Surgeon: Beryle Beams, MD;  Location: [facility];  Service: Endoscopy;  Laterality: N/A;  . DIRECT LARYNGOSCOPY  07/2016   Dr. Nicolette Bang [facility]  . GASTROSTOMY TUBE PLACEMENT  09/27/2016  . HERNIA REPAIR Left 1981  . IR PATIENT EVAL TECH 0-60 MINS  12/13/2016  . IR PATIENT EVAL TECH 0-60 MINS  04/11/2017  . MODIFIED RADICAL NECK DISSECTION Left 09/27/2016   Levels 1 & 2  . PARTIAL GLOSSECTOMY Left 09/27/2016   Left hemi partial glossectomy  . SPINE SURGERY  01/20/2016   fusion  . TONSILLECTOMY    . tracheotomy  09/27/2016  . TUBAL LIGATION  06/1988   Social History   Occupational History  . Occupation: Air cabin crew  Tobacco Use  . Smoking status: Never Smoker  . Smokeless tobacco: Never Used  Substance and Sexual Activity  . Alcohol use: Yes    Alcohol/week: 1.0 standard drinks    Types: 1 Cans of beer per week  . Drug use: No  .  Sexual activity: Yes    Birth control/protection: Surgical

## 2017-11-10 NOTE — Assessment & Plan Note (Signed)
She seems to do well with adderall and is careful to control access to her medicine.  Has significantly improved her quality of life by giving her control of her daytime sleepiness.  We have discussed this medication again, its controlled status, and reviewed basic sleep hygiene, need for naps and responsibility to be alert and safe driving.

## 2017-11-21 ENCOUNTER — Encounter (INDEPENDENT_AMBULATORY_CARE_PROVIDER_SITE_OTHER): Payer: BC Managed Care – PPO | Admitting: Physical Medicine and Rehabilitation

## 2017-11-23 ENCOUNTER — Other Ambulatory Visit (INDEPENDENT_AMBULATORY_CARE_PROVIDER_SITE_OTHER): Payer: Self-pay | Admitting: Specialist

## 2017-11-24 ENCOUNTER — Other Ambulatory Visit: Payer: Self-pay | Admitting: Radiation Oncology

## 2017-11-24 DIAGNOSIS — C01 Malignant neoplasm of base of tongue: Secondary | ICD-10-CM

## 2017-11-24 NOTE — Telephone Encounter (Signed)
Tramadol refill request

## 2017-11-25 ENCOUNTER — Ambulatory Visit
Admission: RE | Admit: 2017-11-25 | Discharge: 2017-11-25 | Disposition: A | Discharge status: Home / Self Care | Payer: BC Managed Care – PPO | Source: Ambulatory Visit | Attending: Specialist | Admitting: Specialist

## 2017-11-25 DIAGNOSIS — M4325 Fusion of spine, thoracolumbar region: Secondary | ICD-10-CM

## 2017-11-25 DIAGNOSIS — M960 Pseudarthrosis after fusion or arthrodesis: Secondary | ICD-10-CM

## 2017-11-25 MED ORDER — DIAZEPAM 5 MG PO TABS: 10.0000 mg | ORAL_TABLET | Freq: Once | ORAL | Status: DC

## 2017-11-25 NOTE — Discharge Instructions (Signed)
Myelogram Discharge Instructions  1. Go home and rest quietly for the next 24 hours.  It is important to lie flat for the next 24 hours.  Get up only to go to the restroom.  You may lie in the bed or on a couch on your back, your stomach, your left side or your right side.  You may have one pillow under your head.  You may have pillows between your knees while you are on your side or under your knees while you are on your back.  2. DO NOT drive today.  Recline the seat as far back as it will go, while still wearing your seat belt, on the way home.  3. You may get up to go to the bathroom as needed.  You may sit up for 10 minutes to eat.  You may resume your normal diet and medications unless otherwise indicated.  Drink lots of extra fluids today and tomorrow.  4. The incidence of headache, nausea, or vomiting is about 5% (one in 20 patients).  If you develop a headache, lie flat and drink plenty of fluids until the headache goes away.  Caffeinated beverages may be helpful.  If you develop severe nausea and vomiting or a headache that does not go away with flat bed rest, call [phone].  5. You may resume normal activities after your 24 hours of bed rest is over; however, do not exert yourself strongly or do any heavy lifting tomorrow. If when you get up you have a headache when standing, go back to bed and force fluids for another 24 hours.  6. Call your physician for a follow-up appointment.  The results of your myelogram will be sent directly to your physician by the following day.  7. If you have any questions or if complications develop after you arrive home, please call [phone].  Discharge instructions have been explained to the patient.  The patient, or the person responsible for the patient, fully understands these instructions.  YOU MAY RESTART YOUR TRAMADOL, IMITREX AND ADDERALL TOMORROW 11/26/2017 AT 10:30AM.

## 2017-11-28 ENCOUNTER — Other Ambulatory Visit: Payer: Self-pay | Admitting: Radiation Oncology

## 2017-11-28 DIAGNOSIS — C01 Malignant neoplasm of base of tongue: Secondary | ICD-10-CM

## 2017-11-28 NOTE — Telephone Encounter (Signed)
Called in to [facility]

## 2017-12-02 ENCOUNTER — Encounter: Payer: Self-pay | Admitting: Internal Medicine

## 2017-12-05 ENCOUNTER — Other Ambulatory Visit: Payer: Self-pay | Admitting: Family Medicine

## 2017-12-06 ENCOUNTER — Encounter: Payer: Self-pay | Admitting: Family Medicine

## 2017-12-09 ENCOUNTER — Encounter (INDEPENDENT_AMBULATORY_CARE_PROVIDER_SITE_OTHER): Payer: Self-pay | Admitting: Specialist

## 2017-12-11 ENCOUNTER — Encounter (HOSPITAL_COMMUNITY): Payer: Self-pay | Admitting: *Deleted

## 2017-12-11 ENCOUNTER — Emergency Department (HOSPITAL_COMMUNITY)
Admission: EM | Admit: 2017-12-11 | Discharge: 2017-12-11 | Disposition: A | Discharge status: Home / Self Care | Payer: BC Managed Care – PPO | Attending: Emergency Medicine | Admitting: Emergency Medicine

## 2017-12-11 DIAGNOSIS — Z7982 Long term (current) use of aspirin: Secondary | ICD-10-CM | POA: Diagnosis not present

## 2017-12-11 DIAGNOSIS — K9429 Other complications of gastrostomy: Secondary | ICD-10-CM | POA: Diagnosis not present

## 2017-12-11 DIAGNOSIS — Z85810 Personal history of malignant neoplasm of tongue: Secondary | ICD-10-CM | POA: Diagnosis not present

## 2017-12-11 DIAGNOSIS — I10 Essential (primary) hypertension: Secondary | ICD-10-CM | POA: Insufficient documentation

## 2017-12-11 DIAGNOSIS — Z79899 Other long term (current) drug therapy: Secondary | ICD-10-CM | POA: Diagnosis not present

## 2017-12-11 DIAGNOSIS — K9420 Gastrostomy complication, unspecified: Secondary | ICD-10-CM

## 2017-12-11 NOTE — Discharge Instructions (Signed)
You will be contacted by the interventional radiology team for exchange of your g tube

## 2017-12-11 NOTE — ED Provider Notes (Signed)
[facility] DEPT Provider Note   CSN: [id number] Arrival date & time: 12/11/17  4098     History   Chief Complaint Chief Complaint  Patient presents with  . feeding tube dislodged    HPI Malay L Greenwood is a 52 y.o. female.  HPI Patient is a 52 year old female presents the emergency department with request for replacement of her gastrostomy tube her 43 [place] G-tube came out at a proximally 4 AM.  She presents requesting a new one to be placed.  No other complaints at this time.  She is had this gastrostomy tube for over a year.  She has had neck cancer.  She is able to eat food.  She mainly uses the G-tube for medications and for supplemental feeds   Past Medical History:  Diagnosis Date  . Allergy   . Anemia    Iron deficiency  . Anxiety   . Arthritis    back, left shoulder  . Cancer ([place])   . Chicken pox   . Depression   . Diarrhea    takes Imodium daily  . GERD (gastroesophageal reflux disease)   . H/O hiatal hernia   . Headache(784.0)   . History of kidney stones    1996ish  . History of radiation therapy 11/16/16- 01/01/17   Base of Tongue/ 66 gy in 33 fractions/ Dose: 2 Gy  . Hypertension   . Migraine    none for 5 years (as of 01/13/16)  . OSA (obstructive sleep apnea) 10/13/2015   unable to get cpap, plans to get one in 2018  . Pneumonia   . Restless legs   . Scoliosis   . Shortness of breath    with exertion    Patient Active Problem List   Diagnosis Date Noted  . Acute encephalopathy 05/15/2017  . UTI (urinary tract infection) 05/15/2017  . Hypotension 02/10/2017  . 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 Malignant neoplasm of base of tongue ([place]) 10/29/2016  . 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[place]) 10/29/2016  . Headache associated with sexual activity 05/14/2016  . Tongue lesion 05/14/2016  . Loose stools 03/12/2016  . Hypocalcemia 01/22/2016  . Thrombocytopenia ([place]) 01/22/2016   . Chronic diarrhea 01/22/2016  . Chest pain   . Essential hypertension   . Orthostatic hypotension   . Spinal stenosis of lumbar region 01/20/2016    Class: Chronic  . DDD (degenerative disc disease), lumbar 01/20/2016    Class: Chronic  . Spinal stenosis of lumbar region with neurogenic claudication 01/20/2016  . Low back pain 12/22/2015  . Pre-op examination 12/22/2015  . Hypersomnolence 10/13/2015  . Muscle cramp 08/01/2015  . GERD (gastroesophageal reflux disease) 07/09/2015  . Excessive daytime sleepiness 06/18/2015  . Shingles 08/28/2013  . Postop check 08/28/2013  . Cholelithiasis 07/10/2013  . Colitis 01/08/2013  . Postoperative anemia 01/08/2013  . Morbid obesity with BMI of 40.0-44.9, adult ([place]) 01/08/2013    Past Surgical History:  Procedure Laterality Date  . BACK SURGERY  07/21/1978  . CHOLECYSTECTOMY N/A 08/15/2013   Procedure: LAPAROSCOPIC CHOLECYSTECTOMY WITH INTRAOPERATIVE CHOLANGIOGRAM;  Surgeon: Gwenyth Ober, MD;  Location: [facility];  Service: General;  Laterality: N/A;  . COLONOSCOPY N/A 01/09/2013   Procedure: COLONOSCOPY;  Surgeon: Beryle Beams, MD;  Location: [facility];  Service: Endoscopy;  Laterality: N/A;  . DIRECT LARYNGOSCOPY  07/2016   Dr. Nicolette [facility]  . GASTROSTOMY TUBE PLACEMENT  09/27/2016  . HERNIA REPAIR Left 1981  .  IR PATIENT EVAL TECH 0-60 MINS  12/13/2016  . IR PATIENT EVAL TECH 0-60 MINS  04/11/2017  . MODIFIED RADICAL NECK DISSECTION Left 09/27/2016   Levels 1 & 2  . PARTIAL GLOSSECTOMY Left 09/27/2016   Left hemi partial glossectomy  . SPINE SURGERY  01/20/2016   fusion  . TONSILLECTOMY    . tracheotomy  09/27/2016  . TUBAL LIGATION  06/1988     OB History   None      Home Medications    Prior to Admission medications   Medication Sig Start Date End Date Taking? Authorizing Provider  amLODipine (NORVASC) 10 MG tablet TAKE 1 TABLET BY MOUTH DAILY 10/13/17   [name], MD  amphetamine-dextroamphetamine  (ADDERALL) 10 MG tablet 1-3 daily as needed for sleepiness 11/07/17   Baird Lyons D, MD  aspirin 81 MG chewable tablet Place 81 mg into feeding tube daily.  10/05/16   [provider]  baclofen (LIORESAL) 10 MG tablet Place 10 mg into feeding tube 3 (three) times daily.  05/10/17   [provider]  calcium carbonate (OSCAL) 1500 (600 Ca) MG TABS tablet 600 mg of elemental calcium 2 (two) times daily with a meal. Per tube    [provider]  diclofenac (VOLTAREN) 75 MG EC tablet TAKE 1 TABLET BY MOUTH TWICE DAILY WITH FOOD AS NEEDED FOR PAIN 09/15/17   Jessy Oto, MD  diphenoxylate-atropine (LOMOTIL) 2.5-0.025 MG tablet 1-2 every 6 hrs (or before meals and at bedtime) 10/27/17   Gatha Mayer, MD  ergocalciferol (DRISDOL) 8000 UNIT/ML drops Place 6 mLs (48,000 Units total) into feeding tube once a week. 03/04/17   [name], MD  gabapentin (NEURONTIN) 300 MG capsule Take 1 capsule (300 mg total) by mouth 3 (three) times daily. Patient taking differently: Place 300 mg into feeding tube 3 (three) times daily.  05/26/17   [name], MD  gabapentin (NEURONTIN) 300 MG capsule Take 1 capsule (300 mg total) by mouth 3 (three) times daily. 12/05/17   [name], MD  LORazepam (ATIVAN) 0.5 MG tablet Take 1 tab PRN nausea. Do not take more than 3 pills daily. 12/31/16   Eppie Gibson, MD  pantoprazole (PROTONIX) 40 MG tablet TAKE 1 TABLET BY MOUTH ONCE DAILY 10/13/17   [name], MD  Potassium 99 MG TABS 1 tablet (99 mg total) by Gastrostomy Tube route daily. Patient taking differently: Place 1 tablet into feeding tube daily.  05/26/17   [name], MD  SUMAtriptan (IMITREX) 100 MG tablet Take 0.5-1 tablet by mouth at the onset of migraine and may repeat in 2 hours if headache persists or recurs. Patient taking differently: Place 100 mg into feeding tube every 2 (two) hours as needed for migraine or headache.  05/14/16   Golden Circle, FNP  traMADol  (ULTRAM) 50 MG tablet TAKE 1 TABLET BY MOUTH EVERY 6 HOURS AS NEEDED FOR PAIN 11/25/17   Jessy Oto, MD    Family History Family History  Problem Relation Age of Onset  . Heart disease Father   . Heart attack Father   . Hypertension Mother   . Arthritis Mother   . Diabetes Maternal Grandmother   . Diabetes Paternal Grandmother   . Diabetes Maternal Uncle     Social History Social History   Tobacco Use  . Smoking status: Never Smoker  . Smokeless tobacco: Never Used  Substance Use Topics  . Alcohol use: Yes  Alcohol/week: 1.0 standard drinks    Types: 1 Cans of beer per week  . Drug use: No     Allergies   Other and Sulfur   Review of Systems Review of Systems  All other systems reviewed and are negative.    Physical Exam Updated Vital Signs BP (!) 129/95 (BP Location: Left Arm)   Pulse 78   Temp 98.8 F (37.1 C) (Oral)   Resp 18   LMP 11/15/2013   SpO2 96%   Physical Exam  Constitutional: She is oriented to person, place, and time. She appears well-developed and well-nourished.  HENT:  Head: Normocephalic.  Eyes: EOM are normal.  Neck: Normal range of motion.  Pulmonary/Chest: Effort normal.  Abdominal: She exhibits no distension.  Gastrostomy tube is absent.  Ostomy without surrounding signs of infection  Musculoskeletal: Normal range of motion.  Neurological: She is alert and oriented to person, place, and time.  Psychiatric: She has a normal mood and affect.  Nursing note and vitals reviewed.    ED Treatments / Results  Labs (all labs ordered are listed, but only abnormal results are displayed) Labs Reviewed - No data to display  EKG None  Radiology No results found.  Procedures Gastrostomy tube replacement Date/Time: 12/11/2017 10:46 AM Performed by: Jola Schmidt, MD Authorized by: Jola Schmidt, MD  Consent: Verbal consent obtained. Risks and benefits: risks, benefits and alternatives were discussed Consent given by:  patient Time out: Immediately prior to procedure a "time out" was called to verify the correct patient, procedure, equipment, support staff and site/side marked as required. Local anesthesia used: no  Anesthesia: Local anesthesia used: no  Sedation: Patient sedated: no  Patient tolerance: Patient tolerated the procedure well with no immediate complications Comments: Ultimately a 82 French Foley catheter was placed.  Unable to upsized to a 69 [place] catheter or to the 18 [place] G-tube despite multiple attempts.  Patient will need exchange of this by interventional radiology in the next 1 to 2 days.    (including critical care time)  Medications Ordered in ED Medications - No data to display   Initial Impression / Assessment and Plan / ED Course  I have reviewed the triage vital signs and the nursing notes.  Pertinent labs & imaging results that were available during my care of the patient were reviewed by me and considered in my medical decision making (see chart for details).     24 French Foley catheter placed.  Stomach contents returned.  Unable to dilate and upsized to a 56 [place] or her 45 French gastrostomy tube.  She will need this replaced by IR.  Outpatient orders placed.  Patient understands return to the ER for new or worsening symptoms.  She can eat foods.  She can crush her medication and put them in her food.  Final Clinical Impressions(s) / ED Diagnoses   Final diagnoses:  Complication of gastrostomy tube [facility])    ED Discharge Orders         Ordered    IR REPLACE G-TUBE SIMPLE WO FLUORO     12/11/17 [place], Caldwell Kronenberger, MD 12/11/17 1047

## 2017-12-11 NOTE — ED Triage Notes (Signed)
Pt reports feeding tube came at 4AM this morning.

## 2017-12-12 ENCOUNTER — Ambulatory Visit (HOSPITAL_COMMUNITY)
Admission: RE | Admit: 2017-12-12 | Discharge: 2017-12-12 | Disposition: A | Discharge status: Home / Self Care | Payer: BC Managed Care – PPO | Source: Ambulatory Visit | Attending: Emergency Medicine | Admitting: Emergency Medicine

## 2017-12-12 ENCOUNTER — Encounter (HOSPITAL_COMMUNITY): Payer: Self-pay | Admitting: Radiology

## 2017-12-12 DIAGNOSIS — K9423 Gastrostomy malfunction: Secondary | ICD-10-CM | POA: Insufficient documentation

## 2017-12-12 HISTORY — PX: IR REPLACE G-TUBE SIMPLE WO FLUORO: IMG2323

## 2017-12-12 MED ORDER — SILVER NITRATE-POT NITRATE 75-25 % EX MISC
CUTANEOUS | Status: AC
  Filled 2017-12-12: qty 1

## 2017-12-12 NOTE — Procedures (Addendum)
Patient underwent exchange today of existing 6 French Foley catheter (serving as  gastrostomy tube following dislodgement) for new 18 French balloon retention gastrostomy tube.  Also s/p application of silver nitrate to granulation tissue at gastrostomy tube insertion site.  No immediate complications.

## 2017-12-13 ENCOUNTER — Telehealth: Payer: Self-pay | Admitting: Internal Medicine

## 2017-12-13 NOTE — Telephone Encounter (Signed)
Patient states she ran out of money and could not afford to get prep for colon tomorrow 11.27.19. Patient wanting to know if she can just have endo and not colon tomorrow. Please advise patient.

## 2017-12-13 NOTE — Telephone Encounter (Signed)
I called patient  I wanted to give her a prep but she is working and unable to get here in time  So We will cancel the procedures and reschedule a double  We can give her a prep sample instead to do next one - she may need a previsit to go over a different prep  No charge to her cancelling this

## 2017-12-13 NOTE — Telephone Encounter (Signed)
Noted Procedure cancelled

## 2017-12-13 NOTE — Telephone Encounter (Signed)
Dr. Carlean Purl,  Is it ok to just do the EGD tomorrow or would you like to wait until both procedures can be done?  Please advise, Cyril Mourning

## 2017-12-13 NOTE — Telephone Encounter (Signed)
Pt called to follow up on this message. Pls call her.

## 2017-12-14 ENCOUNTER — Encounter: Payer: BC Managed Care – PPO | Admitting: Internal Medicine

## 2017-12-14 ENCOUNTER — Encounter (INDEPENDENT_AMBULATORY_CARE_PROVIDER_SITE_OTHER): Payer: Self-pay | Admitting: Radiology

## 2017-12-14 ENCOUNTER — Telehealth: Payer: Self-pay | Admitting: Internal Medicine

## 2017-12-14 ENCOUNTER — Telehealth (INDEPENDENT_AMBULATORY_CARE_PROVIDER_SITE_OTHER): Payer: Self-pay | Admitting: Specialist

## 2017-12-14 NOTE — Telephone Encounter (Signed)
Attempted to call Patient.  Mailbox on VM is full, unable to leave a message.  Dr. Annamaria Boots is out of the office until Monday, and was going to see if she can wait until his return Monday. Will try again at a later time.

## 2017-12-14 NOTE — Telephone Encounter (Signed)
I sent my chart message advising her the note is ready for pick up

## 2017-12-14 NOTE — Telephone Encounter (Signed)
Patient came into the office to request a work note for her to be out of work until her appointment on 12/21/17.  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  CB#[phone].  Thank you.

## 2017-12-14 NOTE — Telephone Encounter (Signed)
Called patient, unable to reach and unable to leave voicemail. Will try to call back.

## 2017-12-16 NOTE — Telephone Encounter (Signed)
Attempted to call pt but unable to reach her and unable to leave a VM due to mailbox being full. Will try to call back later.

## 2017-12-18 ENCOUNTER — Other Ambulatory Visit (INDEPENDENT_AMBULATORY_CARE_PROVIDER_SITE_OTHER): Payer: Self-pay | Admitting: Specialist

## 2017-12-19 ENCOUNTER — Encounter: Payer: Self-pay | Admitting: Family Medicine

## 2017-12-19 ENCOUNTER — Ambulatory Visit (INDEPENDENT_AMBULATORY_CARE_PROVIDER_SITE_OTHER): Payer: BC Managed Care – PPO | Admitting: Family Medicine

## 2017-12-19 VITALS — BP 98/78 | HR 80 | Temp 98.2°F | Wt 174.0 lb

## 2017-12-19 DIAGNOSIS — R3915 Urgency of urination: Secondary | ICD-10-CM

## 2017-12-19 DIAGNOSIS — N3000 Acute cystitis without hematuria: Secondary | ICD-10-CM

## 2017-12-19 LAB — POC URINALSYSI DIPSTICK (AUTOMATED)
Bilirubin, UA: NEGATIVE
Blood, UA: NEGATIVE
Glucose, UA: NEGATIVE
KETONES UA: NEGATIVE
Nitrite, UA: NEGATIVE
Protein, UA: NEGATIVE
SPEC GRAV UA: 1.02 (ref 1.010–1.025)
Urobilinogen, UA: 0.2 E.U./dL
pH, UA: 6.5 (ref 5.0–8.0)

## 2017-12-19 MED ORDER — AMOXICILLIN 500 MG PO CAPS: 500.0000 mg | ORAL_CAPSULE | Freq: Two times a day (BID) | ORAL | 0 refills | Status: AC

## 2017-12-19 NOTE — Telephone Encounter (Signed)
Attempted to call pt but unable to reach her. Left message for pt to return call. 
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
## 2017-12-19 NOTE — Telephone Encounter (Signed)
Tramadol refill request

## 2017-12-19 NOTE — Patient Instructions (Signed)

## 2017-12-20 LAB — URINE CULTURE
MICRO NUMBER:: 91439770
Result:: NO GROWTH
SPECIMEN QUALITY:: ADEQUATE

## 2017-12-20 NOTE — Telephone Encounter (Signed)
VM full-will need to try again later. Need to confirm mailing address.

## 2017-12-21 ENCOUNTER — Encounter (INDEPENDENT_AMBULATORY_CARE_PROVIDER_SITE_OTHER): Payer: Self-pay | Admitting: Specialist

## 2017-12-21 ENCOUNTER — Ambulatory Visit (INDEPENDENT_AMBULATORY_CARE_PROVIDER_SITE_OTHER): Payer: BC Managed Care – PPO | Admitting: Specialist

## 2017-12-21 VITALS — BP 135/91 | HR 65 | Ht 69.0 in | Wt 174.0 lb

## 2017-12-21 DIAGNOSIS — Z4889 Encounter for other specified surgical aftercare: Secondary | ICD-10-CM

## 2017-12-21 DIAGNOSIS — M960 Pseudarthrosis after fusion or arthrodesis: Secondary | ICD-10-CM

## 2017-12-21 DIAGNOSIS — M5136 Other intervertebral disc degeneration, lumbar region: Secondary | ICD-10-CM

## 2017-12-21 DIAGNOSIS — M4807 Spinal stenosis, lumbosacral region: Secondary | ICD-10-CM

## 2017-12-21 DIAGNOSIS — T84498A Other mechanical complication of other internal orthopedic devices, implants and grafts, initial encounter: Secondary | ICD-10-CM | POA: Diagnosis not present

## 2017-12-21 DIAGNOSIS — M4325 Fusion of spine, thoracolumbar region: Secondary | ICD-10-CM

## 2017-12-21 DIAGNOSIS — M51369 Other intervertebral disc degeneration, lumbar region without mention of lumbar back pain or lower extremity pain: Secondary | ICD-10-CM

## 2017-12-21 DIAGNOSIS — M41125 Adolescent idiopathic scoliosis, thoracolumbar region: Secondary | ICD-10-CM

## 2017-12-21 NOTE — Telephone Encounter (Signed)
Attempted to call pt but unable to reach her and unable to leave a VM due to mailbox being full. Due to numerous attempts trying to reach pt and unable to do so, per triage protocol encounter will be closed.

## 2017-12-21 NOTE — Patient Instructions (Signed)
Avoid frequent bending and stooping  No lifting greater than 10 lbs. May use ice or moist heat for pain. Weight loss is of benefit. Best medication for lumbar disc disease is arthritis medications like motrin, celebrex and naprosyn. Exercise is important to improve your indurance and does allow people to function better inspite of back pain.  Lumbar MRI with and without contrast to assess the lumbar spine for stenosis, to be schedule for revision arthrodesis Lumbar spine.

## 2017-12-21 NOTE — Progress Notes (Signed)
Office Visit Note   Patient: Kristen [name]           Date of Birth: [birth year]           MRN: [id number] Visit Date: 12/21/2017              Requested by: [name], MD [place], [place] [id number] PCP: [name], MD   Assessment & Plan: Visit Diagnoses:  1. Loosening of hardware in spine ([place])   2. Other intervertebral disc degeneration, lumbar region   3. Encounter for other specified surgical aftercare   4. Spinal stenosis of lumbosacral region   5. Pseudarthrosis after fusion or arthrodesis   6. Adolescent idiopathic scoliosis of thoracolumbar region   7. Fusion of spine of thoracolumbar region     Plan:Avoid frequent bending and stooping  No lifting greater than 10 lbs. May use ice or moist heat for pain. Weight loss is of benefit. Best medication for lumbar disc disease is arthritis medications like motrin, celebrex and naprosyn. Exercise is important to improve your indurance and does allow people to function better inspite of back pain.  Lumbar MRI with and without contrast to assess the lumbar spine for stenosis, to be schedule for revision arthrodesis Lumbar spine.  Follow-Up Instructions: No follow-ups on file.   Orders:  Orders Placed This Encounter  Procedures  . MR Lumbar Spine W Wo Contrast   No orders of the defined types were placed in this encounter.     Procedures: No procedures performed   Clinical Data: No additional findings.   Subjective: Chief Complaint  Patient presents with  . Lower Back - Follow-up    52 year old female with history of back pain and previous T-L scoliosis with degenerative changes of the lumbar spine and underwent an extension of Her fusion to the lower lumbar spine. Returns with left side pain and numbness in the right side. The back and front of the right thigh is painful riding in a car from [place] to [place] and back. No bowel or bladder difficulty. Started on  Amoxicillin yesterday for a UTI.   Review of Systems  Constitutional: Negative.   HENT: Negative.   Eyes: Negative.   Respiratory: Negative.   Cardiovascular: Negative.   Gastrointestinal: Negative.   Endocrine: Negative.   Genitourinary: Negative.   Musculoskeletal: Negative.   Skin: Negative.   Allergic/Immunologic: Negative.   Neurological: Negative.   Hematological: Negative.   Psychiatric/Behavioral: Negative.      Objective: Vital Signs: BP (!) 135/91 (BP Location: Left Arm, Patient Position: Sitting)   Pulse 65   Ht 5' 9"  (1.753 m)   Wt 174 lb (78.9 kg)   LMP 11/15/2013   BMI 25.70 kg/m   Physical Exam  Constitutional: She is oriented to person, place, and time. She appears well-developed and well-nourished.  HENT:  Head: Normocephalic and atraumatic.  Eyes: Pupils are equal, round, and reactive to light. EOM are normal.  Neck: Normal range of motion. Neck supple.  Pulmonary/Chest: Effort normal and breath sounds normal.  Abdominal: Soft. Bowel sounds are normal.  Musculoskeletal: Normal range of motion.  Neurological: She is alert and oriented to person, place, and time.  Skin: Skin is warm and dry.  Psychiatric: She has a normal mood and affect. Her behavior is normal. Judgment and thought content normal.    Back Exam   Muscle Strength  Right Quadriceps:  5/5  Left Quadriceps:  5/5  Right Hamstrings:  5/5  Left Hamstrings:  5/5       Specialty Comments:  No specialty comments available.  Imaging: No results found.   PMFS History: Patient Active Problem List   Diagnosis Date Noted  . Spinal stenosis of lumbar region 01/20/2016    Priority: High    Class: Chronic  . DDD (degenerative disc disease), lumbar 01/20/2016    Priority: High    Class: Chronic  . Acute encephalopathy 05/15/2017  . UTI (urinary tract infection) 05/15/2017  . Hypotension 02/10/2017  . 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 Malignant  neoplasm of base of tongue ([place]) 10/29/2016  . 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[place]) 10/29/2016  . Headache associated with sexual activity 05/14/2016  . Tongue lesion 05/14/2016  . Loose stools 03/12/2016  . Hypocalcemia 01/22/2016  . Thrombocytopenia ([place]) 01/22/2016  . Chronic diarrhea 01/22/2016  . Chest pain   . Essential hypertension   . Orthostatic hypotension   . Spinal stenosis of lumbar region with neurogenic claudication 01/20/2016  . Low back pain 12/22/2015  . Pre-op examination 12/22/2015  . Hypersomnolence 10/13/2015  . Muscle cramp 08/01/2015  . GERD (gastroesophageal reflux disease) 07/09/2015  . Excessive daytime sleepiness 06/18/2015  . Shingles 08/28/2013  . Postop check 08/28/2013  . Cholelithiasis 07/10/2013  . Colitis 01/08/2013  . Postoperative anemia 01/08/2013  . Morbid obesity with BMI of 40.0-44.9, adult ([place]) 01/08/2013   Past Medical History:  Diagnosis Date  . Allergy   . Anemia    Iron deficiency  . Anxiety   . Arthritis    back, left shoulder  . Cancer ([place])   . Chicken pox   . Depression   . Diarrhea    takes Imodium daily  . GERD (gastroesophageal reflux disease)   . H/O hiatal hernia   . Headache(784.0)   . History of kidney stones    1996ish  . History of radiation therapy 11/16/16- 01/01/17   Base of Tongue/ 66 gy in 33 fractions/ Dose: 2 Gy  . Hypertension   . Migraine    none for 5 years (as of 01/13/16)  . OSA (obstructive sleep apnea) 10/13/2015   unable to get cpap, plans to get one in 2018  . Pneumonia   . Restless legs   . Scoliosis   . Shortness of breath    with exertion    Family History  Problem Relation Age of Onset  . Heart disease Father   . Heart attack Father   . Hypertension Mother   . Arthritis Mother   . Diabetes Maternal Grandmother   . Diabetes Paternal Grandmother   . Diabetes Maternal Uncle     Past Surgical History:  Procedure Laterality Date  . BACK SURGERY  07/21/1978  .  CHOLECYSTECTOMY N/A 08/15/2013   Procedure: LAPAROSCOPIC CHOLECYSTECTOMY WITH INTRAOPERATIVE CHOLANGIOGRAM;  Surgeon: Gwenyth Ober, MD;  Location: [facility];  Service: General;  Laterality: N/A;  . COLONOSCOPY N/A 01/09/2013   Procedure: COLONOSCOPY;  Surgeon: Beryle Beams, MD;  Location: [facility];  Service: Endoscopy;  Laterality: N/A;  . DIRECT LARYNGOSCOPY  07/2016   Dr. Nicolette [facility]  . GASTROSTOMY TUBE PLACEMENT  09/27/2016  . HERNIA REPAIR Left 1981  . IR PATIENT EVAL TECH 0-60 MINS  12/13/2016  . IR PATIENT EVAL TECH 0-60 MINS  04/11/2017  . IR REPLACE G-TUBE SIMPLE WO FLUORO  12/12/2017  . MODIFIED RADICAL NECK DISSECTION Left 09/27/2016   Levels 1 & 2  .  PARTIAL GLOSSECTOMY Left 09/27/2016   Left hemi partial glossectomy  . SPINE SURGERY  01/20/2016   fusion  . TONSILLECTOMY    . tracheotomy  09/27/2016  . TUBAL LIGATION  06/1988   Social History   Occupational History  . Occupation: Air cabin crew  Tobacco Use  . Smoking status: Never Smoker  . Smokeless tobacco: Never Used  Substance and Sexual Activity  . Alcohol use: Yes    Alcohol/week: 1.0 standard drinks    Types: 1 Cans of beer per week  . Drug use: No  . Sexual activity: Yes    Birth control/protection: Surgical

## 2017-12-24 ENCOUNTER — Other Ambulatory Visit: Payer: Self-pay | Admitting: Internal Medicine

## 2017-12-26 NOTE — Telephone Encounter (Signed)
Can we refill this Dr. Carlean Purl?

## 2017-12-26 NOTE — Telephone Encounter (Signed)
Prescription faxed to patient's pharmacy. 

## 2017-12-26 NOTE — Telephone Encounter (Signed)
Res refill x 1 and 1 refill

## 2017-12-30 ENCOUNTER — Telehealth: Payer: Self-pay

## 2017-12-30 NOTE — Telephone Encounter (Signed)
Copied from [place] [id number]. Topic: General - Other >> Dec 22, 2017 10:52 AM Leward Quan A wrote: Reason for CRM:  Patient called to say that she was returning a call from 12/21/17 she think someone may have called about the results of her urine test. Ph# [phone]

## 2018-01-04 ENCOUNTER — Other Ambulatory Visit: Payer: BC Managed Care – PPO

## 2018-01-04 ENCOUNTER — Other Ambulatory Visit (INDEPENDENT_AMBULATORY_CARE_PROVIDER_SITE_OTHER): Payer: Self-pay | Admitting: Specialist

## 2018-01-04 ENCOUNTER — Encounter (INDEPENDENT_AMBULATORY_CARE_PROVIDER_SITE_OTHER): Payer: Self-pay | Admitting: Specialist

## 2018-01-04 DIAGNOSIS — C760 Malignant neoplasm of head, face and neck: Secondary | ICD-10-CM

## 2018-01-04 MED ORDER — LORAZEPAM 0.5 MG PO TABS: ORAL_TABLET | ORAL | 0 refills | Status: DC

## 2018-01-08 ENCOUNTER — Other Ambulatory Visit: Payer: BC Managed Care – PPO

## 2018-01-08 ENCOUNTER — Ambulatory Visit
Admission: RE | Admit: 2018-01-08 | Discharge: 2018-01-08 | Disposition: A | Discharge status: Home / Self Care | Payer: BC Managed Care – PPO | Source: Ambulatory Visit | Attending: Specialist | Admitting: Specialist

## 2018-01-08 DIAGNOSIS — M5136 Other intervertebral disc degeneration, lumbar region: Secondary | ICD-10-CM

## 2018-01-08 DIAGNOSIS — Z4889 Encounter for other specified surgical aftercare: Secondary | ICD-10-CM

## 2018-01-08 DIAGNOSIS — M4807 Spinal stenosis, lumbosacral region: Secondary | ICD-10-CM

## 2018-01-08 MED ORDER — GADOBENATE DIMEGLUMINE 529 MG/ML IV SOLN
15.0000 mL | Freq: Once | INTRAVENOUS | Status: AC | PRN
  Administered 2018-01-08: 15 mL via INTRAVENOUS

## 2018-01-12 ENCOUNTER — Encounter: Payer: Self-pay | Admitting: Family Medicine

## 2018-01-12 NOTE — Telephone Encounter (Signed)
Can cory answer this?

## 2018-01-16 ENCOUNTER — Other Ambulatory Visit: Payer: Self-pay | Admitting: Internal Medicine

## 2018-01-19 ENCOUNTER — Encounter (INDEPENDENT_AMBULATORY_CARE_PROVIDER_SITE_OTHER): Payer: Self-pay | Admitting: Specialist

## 2018-01-19 ENCOUNTER — Other Ambulatory Visit (INDEPENDENT_AMBULATORY_CARE_PROVIDER_SITE_OTHER): Payer: Self-pay | Admitting: Specialist

## 2018-01-20 ENCOUNTER — Encounter (INDEPENDENT_AMBULATORY_CARE_PROVIDER_SITE_OTHER): Payer: Self-pay | Admitting: Specialist

## 2018-01-20 MED ORDER — BACLOFEN 10 MG PO TABS: 10.0000 mg | ORAL_TABLET | Freq: Three times a day (TID) | ORAL | 4 refills | Status: DC

## 2018-01-20 NOTE — Telephone Encounter (Signed)
Ok to rf pls clal htx

## 2018-01-20 NOTE — Telephone Encounter (Signed)
Ok to refill once, NO refill

## 2018-01-20 NOTE — Telephone Encounter (Signed)
Can you advise for [place] please?

## 2018-01-24 ENCOUNTER — Ambulatory Visit: Payer: BC Managed Care – PPO | Admitting: Family Medicine

## 2018-01-24 ENCOUNTER — Encounter: Payer: Self-pay | Admitting: Family Medicine

## 2018-01-24 VITALS — BP 124/90 | HR 68 | Temp 98.7°F | Wt 182.0 lb

## 2018-01-24 DIAGNOSIS — L309 Dermatitis, unspecified: Secondary | ICD-10-CM

## 2018-01-24 DIAGNOSIS — K121 Other forms of stomatitis: Secondary | ICD-10-CM | POA: Diagnosis not present

## 2018-01-24 MED ORDER — MAGIC MOUTHWASH W/LIDOCAINE: ORAL | 0 refills | Status: DC

## 2018-01-24 MED ORDER — HYDROCORTISONE 1 % EX CREA: 1.0000 "application " | TOPICAL_CREAM | Freq: Two times a day (BID) | CUTANEOUS | 0 refills | Status: DC

## 2018-01-24 NOTE — Progress Notes (Signed)
Subjective:    Patient ID: Kristen [name], female    DOB: [birth year], 53 y.o.   MRN: [id number]  No chief complaint on file.   HPI Patient was seen today for ongoing concern.  Pt endorses tongue and mouth edema with burning sensation x2 weeks.  Pt endorses concern for thrush.  States painful when eating and drinking.  Pt does not recent increased appetite.  Now eating 4 meals per day, weight increasing.  Pt with a h/o radiation (2018) for malignant neoplasm of tongue  Pt s/p tracheostomy.  Notes itching at the now healed site.  Is trying not to scratch but has.  Denies drainage, increased warmth, or erythema.  Of note pt may have back surgery in the next few months.  Has a follow-up appointment on January 31 with her surgeon to discuss further.  States old hardware to be replaced and a screw in her back has become loose and is touching a nerve.  Past Medical History:  Diagnosis Date  . Allergy   . Anemia    Iron deficiency  . Anxiety   . Arthritis    back, left shoulder  . Cancer ([place])   . Chicken pox   . Depression   . Diarrhea    takes Imodium daily  . GERD (gastroesophageal reflux disease)   . H/O hiatal hernia   . Headache(784.0)   . History of kidney stones    1996ish  . History of radiation therapy 11/16/16- 01/01/17   Base of Tongue/ 66 gy in 33 fractions/ Dose: 2 Gy  . Hypertension   . Migraine    none for 5 years (as of 01/13/16)  . OSA (obstructive sleep apnea) 10/13/2015   unable to get cpap, plans to get one in 2018  . Pneumonia   . Restless legs   . Scoliosis   . Shortness of breath    with exertion    Allergies  Allergen Reactions  . Other Hives and Swelling    tomato  . Sulfur Hives and Swelling    ROS General: Denies fever, chills, night sweats, changes in weight, changes in appetite HEENT: Denies headaches, ear pain, changes in vision, rhinorrhea, sore throat  +burning and redness of mouth and tongue. CV: Denies CP, palpitations, SOB,  orthopnea Pulm: Denies SOB, cough, wheezing GI: Denies abdominal pain, nausea, vomiting, diarrhea, constipation GU: Denies dysuria, hematuria, frequency, vaginal discharge Msk: Denies muscle cramps, joint pains Neuro: Denies weakness, numbness, tingling Skin: Denies rashes, bruising  +itching of neck Psych: Denies depression, anxiety, hallucinations    Objective:    Blood pressure 124/90, pulse 68, temperature 98.7 F (37.1 C), temperature source Oral, weight 182 lb (82.6 kg), last menstrual period 11/15/2013, SpO2 98 %.   Gen. Pleasant, well-nourished, in no distress, normal affect   HEENT: [place]/AT, face symmetric, conjunctiva clear, no scleral icterus, PERRLA, nares patent without drainage, Oral mucosa and tongue mildly edematous and erythematous without lesions.  No white plaques noted.  Pharynx with erythema, no exudate.  TMs normal bilaterally.  No cervical lymphadenopathy. Lungs: no accessory muscle use, CTAB, no wheezes or rales Cardiovascular: RRR, no m/r/g, no peripheral edema Neuro:  A&Ox3, CN II-XII intact, normal gait Skin:  Warm, dry, intact.  Well-healed tracheostomy incision slightly raised with mildly erythematous skin, no increased warmth or drainage noted.  A small nevi near left superior edge of site.   Wt Readings from Last 3 Encounters:  01/24/18 182 lb (82.6 kg)  12/21/17 174 lb (78.9 kg)  12/19/17 174 lb (78.9 kg)    Lab Results  Component Value Date   WBC 3.5 (L) 07/27/2017   HGB 11.6 (L) 07/27/2017   HCT 35.5 (L) 07/27/2017   PLT 171.0 07/27/2017   GLUCOSE 95 07/27/2017   ALT 52 05/15/2017   AST 62 (H) 05/15/2017   NA 146 (H) 07/27/2017   K 4.3 07/27/2017   CL 107 07/27/2017   CREATININE 0.80 07/27/2017   BUN 17 07/27/2017   CO2 31 07/27/2017   TSH 1.40 07/27/2017   INR 0.95 07/22/2017   HGBA1C 5.6 06/18/2015    Assessment/Plan:  Stomatitis  -given handout - Plan: magic mouthwash w/lidocaine SOLN  Dermatitis -Discussed using cortisone  cream 1% twice daily as needed x7 days -Discussed monitoring skin status post tracheostomy course S/S infection -Advised to follow-up if pruritus continues  Follow-up PRN  Grier Mitts, MD

## 2018-01-24 NOTE — Patient Instructions (Signed)
Stomatitis Stomatitis is a condition that causes inflammation in your mouth. It can affect all or part of the inside of the mouth. The condition often affects your cheek, teeth, gums, lips, and tongue. Stomatitis can also affect the mucous membranes that surround the inside of your mouth (mucosa). Pain from stomatitis can make it hard for you to eat or drink. Severe cases of this condition can lead to dehydration or poor nutrition. What are the causes? Common causes of this condition include:  Infections caused by: ? Viruses. This includes colds sores and shingles. ? Bacteria. ? Fungus or yeast. This includes oral thrush.  Cancer treatment, including chemotherapy and radiation therapy.  Not getting enough of certain vitamins (vitamin deficiency).  Not getting adequate nutrition.  Injury to your mouth. This can be from: ? Dentures or braces that do not fit well. ? Biting your tongue or cheek. ? Burning your mouth. ? Having sharp or broken teeth.  Gum disease.  Using tobacco, especially chewing tobacco.  Allergies to foods, medicines, or substances that are used in your mouth.  Certain medicines. In some cases, the cause may not be known. What increases the risk? You are more likely to develop this condition if you:  Have poor oral hygiene or poor nutrition.  Have any condition that weakens the body's defense system (immune system).  Are being treated for cancer.  Use tobacco products.  Have any condition that causes a dry mouth.  Are under a lot of physical or emotional stress. What are the signs or symptoms? The most common symptoms of this condition are pain, swelling, and redness inside your mouth. The pain may feel like burning or stinging. It may get worse from eating or drinking. Other symptoms include:  Painful, shallow sores (ulcers) in the mouth.  White patches in the mouth.  Blisters in the mouth.  Bleeding gums.  Swollen gums.  Bad breath.  Bad  taste in the mouth.  Fever. How is this diagnosed? This condition is diagnosed based on a physical exam of your mouth. You may also have other tests, including:  Blood tests to look for infection or vitamin deficiencies.  A mouth swab of a fluid sample to test for bacteria (culture).  Tissue sample from an ulcer to be examined under a microscope (biopsy). How is this treated? Treatment for stomatitis depends on the cause. Treatment may include medicines, such as:  Over-the counter pain medicines.  Gel, cream, or spray to numb the area (topical anesthetic) if you have severe pain.  Medicines to treat a bacterial infection (antibiotics).  Medicines to treat a fungal infection (antifungals).  Medicines to treat a viral infection (antivirals).  Vitamins.  Mouth rinses to reduce swelling (steroids).  Other medicines to coat or numb your mouth. You may also need to stop or change any medicines that might be causing stomatitis. Follow these instructions at home: Medicines  Take over-the-counter and prescription medicines only as told by your health care provider.  If you were prescribed an antibiotic medicine, take it as told by your health care provider. Do not stop taking the antibiotic even if you start to feel better.  Do not use products that contain benzocaine (including numbing gels) to treat teething or mouth pain in children who are younger than 2 years. These products may cause a rare but serious blood condition.  Do not drive or use heavy machinery while taking prescription pain medicine. Eating and drinking  Eat a balanced diet. Do not eat: ?  Spicy foods. ? Citrus, such as oranges. ? Foods that have sharp edges, such as chips.  Avoid any foods or other allergens that you think may be causing your stomatitis.  Do not drink alcohol.  Drink enough fluid to keep your urine pale yellow. This will keep you hydrated. Lifestyle      Practice good oral  hygiene: ? Gently brush your teeth with a soft toothbrush two times each day. ? Floss your teeth every day. ? Have your teeth cleaned regularly as recommended by your dentist.  If you have dentures, make sure that they are properly fitted.  Do not use any products that contain nicotine or tobacco, such as cigarettes and e-cigarettes. If you need help quitting, ask your health care provider.  Find ways to reduce stress. Try yoga or meditation. Ask your health care provider for other ideas. General instructions  Rinse your mouth with a salt-water mixture 3-4 times a day or as needed. To make a salt-water mixture, completely dissolve -1 tsp of salt in 1 cup of warm water.  Keep all follow-up visits as told by your health care provider. This is important. Contact a health care provider if:  Your symptoms get worse.  You develop new symptoms, especially: ? A rash. ? New symptoms that do not involve your mouth area.  Your symptoms last longer than 3 weeks.  Your stomatitis goes away and then returns.  You feel very tired (fatigued) or weak.  You lose your appetite or you feel nauseous. Get help right away if you:  Have a fever.  Are not able to eat or drink.  Have a lot of bleeding in your mouth. Summary  Stomatitis is a condition that causes inflammation in your mouth.  Pain from stomatitis can make it hard for you to eat or drink. Severe cases of this condition can lead to dehydration or poor nutrition.  Treatment for stomatitis depends on the cause. Treatment may include taking medicines or vitamins, or using a mouth rinse.  Follow instructions from your health care provider on diet and lifestyle changes to help manage your symptoms. This information is not intended to replace advice given to you by your health care provider. Make sure you discuss any questions you have with your health care provider. Document Released: 11/01/2006 Document Revised: 02/01/2017 Document  Reviewed: 02/01/2017 Elsevier Interactive Patient Education  2019 [employer].

## 2018-02-03 ENCOUNTER — Other Ambulatory Visit (INDEPENDENT_AMBULATORY_CARE_PROVIDER_SITE_OTHER): Payer: Self-pay | Admitting: Orthopaedic Surgery

## 2018-02-04 NOTE — Telephone Encounter (Signed)
yes

## 2018-02-08 ENCOUNTER — Encounter: Payer: Self-pay | Admitting: Family Medicine

## 2018-02-08 ENCOUNTER — Other Ambulatory Visit: Payer: Self-pay | Admitting: Internal Medicine

## 2018-02-16 ENCOUNTER — Ambulatory Visit (INDEPENDENT_AMBULATORY_CARE_PROVIDER_SITE_OTHER): Payer: BC Managed Care – PPO | Admitting: Specialist

## 2018-02-16 ENCOUNTER — Ambulatory Visit (INDEPENDENT_AMBULATORY_CARE_PROVIDER_SITE_OTHER): Payer: Self-pay

## 2018-02-16 ENCOUNTER — Encounter (INDEPENDENT_AMBULATORY_CARE_PROVIDER_SITE_OTHER): Payer: Self-pay | Admitting: Specialist

## 2018-02-16 VITALS — BP 137/89 | HR 69 | Ht 69.0 in | Wt 182.0 lb

## 2018-02-16 DIAGNOSIS — M4325 Fusion of spine, thoracolumbar region: Secondary | ICD-10-CM

## 2018-02-16 DIAGNOSIS — M5441 Lumbago with sciatica, right side: Secondary | ICD-10-CM | POA: Diagnosis not present

## 2018-02-16 DIAGNOSIS — M79641 Pain in right hand: Secondary | ICD-10-CM

## 2018-02-16 DIAGNOSIS — M1812 Unilateral primary osteoarthritis of first carpometacarpal joint, left hand: Secondary | ICD-10-CM | POA: Diagnosis not present

## 2018-02-16 DIAGNOSIS — G8929 Other chronic pain: Secondary | ICD-10-CM

## 2018-02-16 MED ORDER — TRAMADOL HCL 50 MG PO TABS: 50.0000 mg | ORAL_TABLET | Freq: Four times a day (QID) | ORAL | 0 refills | Status: AC | PRN

## 2018-02-16 MED ORDER — MELOXICAM 15 MG PO TABS: 15.0000 mg | ORAL_TABLET | Freq: Every day | ORAL | 2 refills | Status: DC

## 2018-02-16 NOTE — Patient Instructions (Addendum)
Avoid frequent bending and stooping  No lifting greater than 10 lbs. May use ice or moist heat for pain. Weight loss is of benefit. Best medication for lumbar disc disease is arthritis medications like motrin, celebrex and naprosyn. Exercise is important to improve your indurance and does allow people to function better inspite of back pain. Ice the area of the injection 10-15 minutes 3-4 times per day. Use a left thumb splint, soft neoprene. Meloxicam 15 mg po daily. EMG/NCV ordered for the right leg to assess for a pinched nerve or nerve irritation.

## 2018-02-16 NOTE — Progress Notes (Signed)
Office Visit Note   Patient: Kristen [name]           Date of Birth: Sep 19, 1965           MRN: [id number] Visit Date: 02/16/2018              Requested by: [name], MD [place], [place] [address] PCP: [name], MD   Assessment & Plan: Visit Diagnoses:  1. Pain of right hand     Plan: Avoid frequent bending and stooping  No lifting greater than 10 lbs. May use ice or moist heat for pain. Weight loss is of benefit. Best medication for lumbar disc disease is arthritis medications like motrin, celebrex and naprosyn. Exercise is important to improve your indurance and does allow people to function better inspite of back pain. Ice the area of the injection 10-15 minutes 3-4 times per day. Use a left thumb splint, soft neoprene. Meloxicam 15 mg po daily. EMG/NCV ordered for the right leg to assess for a pinched nerve or nerve irritation.   Follow-Up Instructions: No follow-ups on file.   Orders:  Orders Placed This Encounter  Procedures  . XR Hand Complete Left   No orders of the defined types were placed in this encounter.     Procedures: No procedures performed   Clinical Data: No additional findings.   Subjective: Chief Complaint  Patient presents with  . Lower Back - Follow-up    MRI review    53 year old female with one year history of left hand basal thumb joint pain. Pain with gripping and with the use of the left hand. The shoulder on the left side in the past was a source of pain and required physical therapy. She has filed for disability and she request a copy of her notes and assistance. She had a history of pinched nerve in her back in her twenties, also had 3 children. The Left hand is keeping her awake at night and her left shoulder is painful. She hurts sleeping on the left shoulder. She is loosing her grip left hand and her strength in using the left arm over head is weak. She reports dropping items with the left  hand and there is worsening of clumbsiness.  No pain with ROM of c-spine. There is a pull in the left shoulder with ROM of the neck. The left shoulder pain is a "7" on scale 1-10.  And the left hand and base of thumb is about a 9. No pain with cough or sneeze. She is able to walk and use the legs okay. 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 Numbness a little in the left mid and lower biceps and lateral arm.    Review of Systems  Constitutional: Positive for appetite change. Negative for activity change, chills, diaphoresis, fever and unexpected weight change.  HENT: Positive for sore throat and trouble swallowing. Negative for congestion, dental problem, drooling, ear discharge, ear pain, facial swelling, hearing loss, mouth sores, nosebleeds, postnasal drip, rhinorrhea, sinus pressure, sinus pain, sneezing, tinnitus and voice change.   Eyes: Negative.  Negative for photophobia, pain, discharge, redness, itching and visual disturbance.  Respiratory: Negative.  Negative for apnea, cough, choking, chest tightness, shortness of breath, wheezing and stridor.   Cardiovascular: Negative.  Negative for chest pain, palpitations and leg swelling.  Gastrointestinal: Negative.  Negative for  abdominal distention, abdominal pain, anal bleeding, blood in stool, constipation, diarrhea, nausea, rectal pain and vomiting.  Endocrine: Negative.  Negative for cold intolerance, heat intolerance, polydipsia, polyphagia and polyuria.  Genitourinary: Negative.  Negative for difficulty urinating, dyspareunia, dysuria, enuresis, flank pain, frequency, genital sores, hematuria and pelvic pain.  Musculoskeletal: Positive for back pain and gait problem.  Skin: Negative.   Allergic/Immunologic: Negative.   Hematological: Negative.   Psychiatric/Behavioral: Negative.   All other systems reviewed and are negative.    Objective: Vital  Signs: BP 137/89 (BP Location: Left Arm, Patient Position: Sitting)   Pulse 69   Ht 5' 9"  (1.753 m)   Wt 182 lb (82.6 kg)   LMP 11/15/2013   BMI 26.88 kg/m   Physical Exam Constitutional:      Appearance: She is well-developed.  HENT:     Head: Normocephalic and atraumatic.  Eyes:     Pupils: Pupils are equal, round, and reactive to light.  Neck:     Musculoskeletal: Normal range of motion and neck supple.  Pulmonary:     Effort: Pulmonary effort is normal.     Breath sounds: Normal breath sounds.  Abdominal:     General: Bowel sounds are normal.     Palpations: Abdomen is soft.  Musculoskeletal: Normal range of motion.  Skin:    General: Skin is warm and dry.  Neurological:     Mental Status: She is alert and oriented to person, place, and time.  Psychiatric:        Behavior: Behavior normal.        Thought Content: Thought content normal.        Judgment: Judgment normal.     Ortho Exam  Specialty Comments:  No specialty comments available.  Imaging: No results found.   PMFS History: Patient Active Problem List   Diagnosis Date Noted  . Spinal stenosis of lumbar region 01/20/2016    Priority: High    Class: Chronic  . DDD (degenerative disc disease), lumbar 01/20/2016    Priority: High    Class: Chronic  . Acute encephalopathy 05/15/2017  . UTI (urinary tract infection) 05/15/2017  . Hypotension 02/10/2017  . 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 Malignant neoplasm of base of tongue ([place]) 10/29/2016  . 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[place]) 10/29/2016  . Headache associated with sexual activity 05/14/2016  . Tongue lesion 05/14/2016  . Loose stools 03/12/2016  . Hypocalcemia 01/22/2016  . Thrombocytopenia ([facility]) 01/22/2016  . Chronic diarrhea 01/22/2016  . Chest pain   . Essential hypertension   . Orthostatic hypotension   . Spinal stenosis of lumbar region with neurogenic claudication 01/20/2016  . Low back pain  12/22/2015  . Pre-op examination 12/22/2015  . Hypersomnolence 10/13/2015  . Muscle cramp 08/01/2015  . GERD (gastroesophageal reflux disease) 07/09/2015  . Excessive daytime sleepiness 06/18/2015  . Shingles 08/28/2013  . Postop check 08/28/2013  . Cholelithiasis 07/10/2013  . Colitis 01/08/2013  . Postoperative anemia 01/08/2013  . Morbid obesity with BMI of 40.0-44.9, adult ([facility]) 01/08/2013   Past Medical History:  Diagnosis Date  . Allergy   . Anemia    Iron deficiency  . Anxiety   . Arthritis    back, left shoulder  . Cancer ([facility])   . Chicken pox   . Depression   . Diarrhea    takes Imodium daily  . GERD (gastroesophageal reflux disease)   . H/O hiatal hernia   . Headache(784.0)   .  History of kidney stones    1996ish  . History of radiation therapy 11/16/16- 01/01/17   Base of Tongue/ 66 gy in 33 fractions/ Dose: 2 Gy  . Hypertension   . Migraine    none for 5 years (as of 01/13/16)  . OSA (obstructive sleep apnea) 10/13/2015   unable to get cpap, plans to get one in 2018  . Pneumonia   . Restless legs   . Scoliosis   . Shortness of breath    with exertion    Family History  Problem Relation Age of Onset  . Heart disease Father   . Heart attack Father   . Hypertension Mother   . Arthritis Mother   . Diabetes Maternal Grandmother   . Diabetes Paternal Grandmother   . Diabetes Maternal Uncle     Past Surgical History:  Procedure Laterality Date  . BACK SURGERY  07/21/1978  . CHOLECYSTECTOMY N/A 08/15/2013   Procedure: LAPAROSCOPIC CHOLECYSTECTOMY WITH INTRAOPERATIVE CHOLANGIOGRAM;  Surgeon: Gwenyth Ober, MD;  Location: [facility];  Service: General;  Laterality: N/A;  . COLONOSCOPY N/A 01/09/2013   Procedure: COLONOSCOPY;  Surgeon: Beryle Beams, MD;  Location: [facility];  Service: Endoscopy;  Laterality: N/A;  . DIRECT LARYNGOSCOPY  07/2016   Dr. Nicolette Bang [facility]  . GASTROSTOMY TUBE PLACEMENT  09/27/2016  . HERNIA REPAIR Left 1981  . IR PATIENT EVAL TECH  0-60 MINS  12/13/2016  . IR PATIENT EVAL TECH 0-60 MINS  04/11/2017  . IR REPLACE G-TUBE SIMPLE WO FLUORO  12/12/2017  . MODIFIED RADICAL NECK DISSECTION Left 09/27/2016   Levels 1 & 2  . PARTIAL GLOSSECTOMY Left 09/27/2016   Left hemi partial glossectomy  . SPINE SURGERY  01/20/2016   fusion  . TONSILLECTOMY    . tracheotomy  09/27/2016  . TUBAL LIGATION  06/1988   Social History   Occupational History  . Occupation: Air cabin crew  Tobacco Use  . Smoking status: Never Smoker  . Smokeless tobacco: Never Used  Substance and Sexual Activity  . Alcohol use: Yes    Alcohol/week: 1.0 standard drinks    Types: 1 Cans of beer per week  . Drug use: No  . Sexual activity: Yes    Birth control/protection: Surgical

## 2018-02-17 ENCOUNTER — Encounter (INDEPENDENT_AMBULATORY_CARE_PROVIDER_SITE_OTHER): Payer: Self-pay | Admitting: Specialist

## 2018-02-27 ENCOUNTER — Other Ambulatory Visit: Payer: Self-pay | Admitting: Internal Medicine

## 2018-03-12 ENCOUNTER — Encounter: Payer: Self-pay | Admitting: Family Medicine

## 2018-03-13 ENCOUNTER — Other Ambulatory Visit (INDEPENDENT_AMBULATORY_CARE_PROVIDER_SITE_OTHER): Payer: Self-pay | Admitting: Physician Assistant

## 2018-03-14 NOTE — Telephone Encounter (Signed)
Tramadol refill request

## 2018-03-14 NOTE — Telephone Encounter (Signed)
Nitka patient.  Please send to him

## 2018-03-15 ENCOUNTER — Encounter (INDEPENDENT_AMBULATORY_CARE_PROVIDER_SITE_OTHER): Payer: Self-pay | Admitting: Specialist

## 2018-03-15 ENCOUNTER — Ambulatory Visit: Payer: BC Managed Care – PPO | Admitting: Internal Medicine

## 2018-03-15 ENCOUNTER — Encounter: Payer: Self-pay | Admitting: Internal Medicine

## 2018-03-15 DIAGNOSIS — D696 Thrombocytopenia, unspecified: Secondary | ICD-10-CM

## 2018-03-15 DIAGNOSIS — G4710 Hypersomnia, unspecified: Secondary | ICD-10-CM

## 2018-03-15 MED ORDER — AMPHETAMINE-DEXTROAMPHETAMINE 10 MG PO TABS: ORAL_TABLET | ORAL | 0 refills | Status: DC

## 2018-03-15 NOTE — Assessment & Plan Note (Signed)
We discussed and can refill Adderall.  When it has been a year or more since her last sleep study, her insurance will probably cover a repeat N PSG followed by [facility]. Plan continue sleep hygiene including naps and responsibility to drive only when alert.  Refill Adderall.

## 2018-03-15 NOTE — Assessment & Plan Note (Signed)
She knows to watch for and report abnormal bleeding.

## 2018-03-15 NOTE — Progress Notes (Signed)
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  She reports that Adderall tablets have significantly improved control of her daytime sleepiness.  Denies associated mood change or palpitation.  Still occasional nap.  No problems with driving. Usually taking 1 or sometimes 2 tablets.  Needs refill now.  Discussed controlled status again.  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  She does plan to find other work.  She continues to work on sleep hygiene as discussed. No recurrence of base of tongue cancer.  Uses a G-tube now only for meds.  Something she swallowed yesterday was too big got hung up and hurt as she hacked to get it out.  This left her with pain in throat.  Breathing comfortably.  A small recurrence on  her hand is covered with a Band-Aid but she says it has been oozing blood for a week-on aspirin.  I cautioned her about signs of GI bleeding to watch for.  ROS-see HPI   + = positive Constitutional:    weight loss, night sweats, fevers, chills, +fatigue, lassitude. HEENT:    headaches, difficulty swallowing, tooth/dental problems, sore throat,       sneezing, itching, ear ache, nasal congestion, post nasal drip, snoring CV:    chest pain, orthopnea, PND, swelling in lower extremities, anasarca,                                                    dizziness, palpitations Resp:   shortness of breath with exertion or at rest.                productive cough,   non-productive cough, coughing up of blood.              change in color of mucus.  wheezing.   Skin:    rash or lesions. GI:  No-   heartburn, indigestion, abdominal pain, nausea, vomiting, diarrhea,                 change in bowel habits, loss of appetite GU: dysuria, change in color of urine, no urgency or frequency.   flank pain. MS:   joint pain, stiffness, decreased range of motion, back pain. Neuro-     nothing unusual Psych:  change in mood or affect.  depression or anxiety.   memory loss.  OBJ- Physical Exam General- Alert, Oriented, Affect-appropriate, Distress- none acute,  Skin- rash-none, lesions- none, excoriation- none Lymphadenopathy- none Head- atraumatic  Eyes- Gross vision intact, PERRLA, conjunctivae and secretions clear            Ears- Hearing, canals-normal            Nose- Clear, no-Septal dev, mucus, polyps, erosion, perforation             Throat- Mallampati III-IV , mucosa clear , drainage- none, tonsils- atrophic, + edentulous                       +speech lightly slurred.  I did not see obvious surgical changes. Neck- flexible , trachea midline, no stridor , thyroid nl, carotid no bruit Chest - symmetrical excursion , unlabored           Heart/CV- RRR , no murmur , no gallop  , no rub, nl s1 s2                            - JVD- none , edema- none, stasis changes- none, varices- none           Lung- clear to P&A, wheeze- none, cough- none , dullness-none, rub- none           Chest wall- + postsurgical scars Abd-   + Gastric feeding tube Br/ Gen/ Rectal- Not done, not indicated Extrem- cyanosis- none, clubbing, none, atrophy- none, strength- nl Neuro- grossly intact to observation

## 2018-03-15 NOTE — Patient Instructions (Signed)
Continue to get enough sleep, including naps if needed, so you aren't sleepy if you need to work or drive.  Script sent refilling Adderall

## 2018-03-16 ENCOUNTER — Ambulatory Visit (INDEPENDENT_AMBULATORY_CARE_PROVIDER_SITE_OTHER): Payer: BC Managed Care – PPO | Admitting: Physical Medicine and Rehabilitation

## 2018-03-16 ENCOUNTER — Encounter (INDEPENDENT_AMBULATORY_CARE_PROVIDER_SITE_OTHER): Payer: Self-pay | Admitting: Physical Medicine and Rehabilitation

## 2018-03-16 DIAGNOSIS — M79604 Pain in right leg: Secondary | ICD-10-CM | POA: Diagnosis not present

## 2018-03-16 DIAGNOSIS — R202 Paresthesia of skin: Secondary | ICD-10-CM

## 2018-03-16 NOTE — Progress Notes (Signed)
.  Numeric Pain Rating Scale and Functional Assessment Average Pain 10   In the last MONTH (on 0-10 scale) has pain interfered with the following?  1. General activity like being  able to carry out your everyday physical activities such as walking, climbing stairs, carrying groceries, or moving a chair?  Rating(8)

## 2018-03-19 ENCOUNTER — Other Ambulatory Visit: Payer: Self-pay | Admitting: Internal Medicine

## 2018-03-19 HISTORY — PX: COLONOSCOPY: SHX174

## 2018-03-19 HISTORY — PX: ESOPHAGOGASTRODUODENOSCOPY: SHX1529

## 2018-03-20 NOTE — Telephone Encounter (Signed)
I reached out to Mrs Kristen [name] and set her [facility] appointments up and refilled her lomotil , faxed the rx to Peter.

## 2018-03-20 NOTE — Telephone Encounter (Signed)
Please advise Sir? Thank you. 

## 2018-03-20 NOTE — Progress Notes (Signed)
Kristen [name] presents for follow up of radiation completed 01/01/2017 to her tongue.   Pain issues, if any: She reports a chronic sore throat.  Using a feeding tube?: Yes, using for meds only. She is not able to swallow pills orally. She reports that her PEG tube is painful, and she has scheduled an appointment with Interventional Radiology to have it evaluated.  Weight changes, if any:  Wt Readings from Last 3 Encounters:  03/24/18 185 lb 6.4 oz (84.1 kg)  03/15/18 182 lb 3.2 oz (82.6 kg)  02/16/18 182 lb (82.6 kg)   Swallowing issues, if any: She needs to eat her food slowly and take smaller bites. She does not modify her diet.  Smoking or chewing tobacco? No Using fluoride trays daily? N/A Last ENT visit was on: Dr. Nicolette Bang last on 03/07/18 with next apt scheduled for 05/30/18.  Other notable issues, if any:  She reports that she will occasionally lose her voice or become hoarse. She tells me that it will happen every once in a while and will sometimes take a few days to recover her voice.  She does have a dry throat.   BP 136/87 (BP Location: Right Arm)   Pulse 61   Temp 98.2 F (36.8 C) (Oral)   Resp 18   Ht 5' 9"  (1.753 m)   Wt 185 lb 6.4 oz (84.1 kg)   LMP 11/15/2013   SpO2 98%   BMI 27.38 kg/m

## 2018-03-20 NOTE — Telephone Encounter (Signed)
She was supposed to do an EGD and colonoscopy  (see other notes)  Please ask her to reschedule these if she has not done so and may refill to get her to the procedures

## 2018-03-22 NOTE — Procedures (Signed)
EMG & NCV Findings: Evaluation of the right tibial motor nerve showed reduced amplitude (2.8 mV).  All remaining nerves (as indicated in the following tables) were within normal limits.    All examined muscles (as indicated in the following table) showed no evidence of electrical instability.    Impression: Essentially NORMAL electrodiagnostic study of the right lower limb.  There is no significant electrodiagnostic evidence of nerve entrapment, lumbosacral plexopathy, lumbar radiculopathy or generalized peripheral neuropathy.    As you know, purely sensory or demyelinating radiculopathies and chemical radiculitis may not be detected with this particular electrodiagnostic study.  Recommendations: 1.  Follow-up with referring physician. 2.  Continue current management of symptoms.  ___________________________ Laurence Spates FAAPMR Board Certified, American Board of Physical Medicine and Rehabilitation    Nerve Conduction Studies Anti Sensory Summary Table   Stim Site NR Peak (ms) Norm Peak (ms) P-T Amp (V) Norm P-T Amp Site1 Site2 Delta-P (ms) Dist (cm) Vel (m/s) Norm Vel (m/s)  Right Saphenous Anti Sensory (Ant Med Mall)  33.4C  14cm    3.4 <4.4 7.6 >2 14cm Ant Med Mall 3.4 0.0  >32  Right Sup Fibular Anti Sensory (Ant Lat Mall)  33.4C  14 cm    3.2 <4.4 7.0 >5.0 14 cm Ant Lat Mall 3.2 14.0 44 >32  Right Sural Anti Sensory (Lat Mall)  33.3C  Calf    3.6 <4.0 7.7 >5.0 Calf Lat Mall 3.6 14.0 39 >35   Motor Summary Table   Stim Site NR Onset (ms) Norm Onset (ms) O-P Amp (mV) Norm O-P Amp Site1 Site2 Delta-0 (ms) Dist (cm) Vel (m/s) Norm Vel (m/s)  Right Fibular Motor (Ext Dig Brev)  33.2C  Ankle    3.6 <6.1 2.6 >2.5 B Fib Ankle 7.2 36.7 51 >38  B Fib    10.8  2.3  Poplt B Fib 1.9 12.0 63 >40  Poplt    12.7  1.3         Right Tibial Motor (Abd Hall Brev)  33.4C  Ankle    4.2 <6.1 *2.8 >3.0 Knee Ankle 8.4 42.0 50 >35  Knee    12.6  1.8          EMG   Side Muscle Nerve Root  Ins Act Fibs Psw Amp Dur Poly Recrt Int Fraser Din Comment  Right AntTibialis Dp Br Peron L4-5 Nml Nml Nml Nml Nml 0 Nml Nml   Right Fibularis Longus  Sup Br Peron L5-S1 Nml Nml Nml Nml Nml 0 Nml Nml   Right MedGastroc Tibial S1-2 Nml Nml Nml Nml Nml 0 Nml Nml   Right VastusMed Femoral L2-4 Nml Nml Nml Nml Nml 0 Nml Nml   Right BicepsFemS Sciatic L5-S1 Nml Nml Nml Nml Nml 0 Nml Nml     Nerve Conduction Studies Anti Sensory Left/Right Comparison   Stim Site L Lat (ms) R Lat (ms) L-R Lat (ms) L Amp (V) R Amp (V) L-R Amp (%) Site1 Site2 L Vel (m/s) R Vel (m/s) L-R Vel (m/s)  Saphenous Anti Sensory (Ant Med Mall)  33.4C  14cm  3.4   7.6  14cm Ant Med Mall     Sup Fibular Anti Sensory (Ant Lat Mall)  33.4C  14 cm  3.2   7.0  14 cm Ant Lat Mall  44   Sural Anti Sensory (Lat Mall)  33.3C  Calf  3.6   7.7  Calf Lat Mall  39    Motor Left/Right Comparison   Stim Site  L Lat (ms) R Lat (ms) L-R Lat (ms) L Amp (mV) R Amp (mV) L-R Amp (%) Site1 Site2 L Vel (m/s) R Vel (m/s) L-R Vel (m/s)  Fibular Motor (Ext Dig Brev)  33.2C  Ankle  3.6   2.6  B Fib Ankle  51   B Fib  10.8   2.3  Poplt B Fib  63   Poplt  12.7   1.3        Tibial Motor (Abd Hall Brev)  33.4C  Ankle  4.2   *2.8  Knee Ankle  50   Knee  12.6   1.8           Waveforms:

## 2018-03-22 NOTE — Progress Notes (Signed)
Kristen [name] - 53 y.o. female MRN [id number]  Date of birth: Nov 27, 1965  Office Visit Note: Visit Date: 03/16/2018 PCP: [name], MD Referred by: [name], MD  Subjective: Chief Complaint  Patient presents with  . Right Leg - Pain, Numbness   HPI: Kristen [name] is a 53 y.o. female who comes in today At the request of Dr. Shellia Cleverly is electrodiagnostic study of the right lower limb.  She reports numbness and tingling and pain in the right leg all the way down to the foot and somewhat of an L5 distribution.  She reports this started in 2018.  Is worse with walking and climbing stairs.  She does get some relief with laying down.  She has had no prior electrodiagnostic study.  She has had prior thoracolumbar fusion with Harrington rod.  ROS Otherwise per HPI.  Assessment & Plan: Visit Diagnoses:  1. Pain in right leg   2. Paresthesia of skin     Plan: Impression: Essentially NORMAL electrodiagnostic study of the right lower limb.  There is no significant electrodiagnostic evidence of nerve entrapment, lumbosacral plexopathy, lumbar radiculopathy or generalized peripheral neuropathy.    As you know, purely sensory or demyelinating radiculopathies and chemical radiculitis may not be detected with this particular electrodiagnostic study.  Recommendations: 1.  Follow-up with referring physician. 2.  Continue current management of symptoms.  Meds & Orders: No orders of the defined types were placed in this encounter.   Orders Placed This Encounter  Procedures  . NCV with EMG (electromyography)    Follow-up: Return for Basil Dess, M.D..   Procedures: No procedures performed  EMG & NCV Findings: Evaluation of the right tibial motor nerve showed reduced amplitude (2.8 mV).  All remaining nerves (as indicated in the following tables) were within normal limits.    All examined muscles (as indicated in the following table) showed no evidence of electrical  instability.    Impression: Essentially NORMAL electrodiagnostic study of the right lower limb.  There is no significant electrodiagnostic evidence of nerve entrapment, lumbosacral plexopathy, lumbar radiculopathy or generalized peripheral neuropathy.    As you know, purely sensory or demyelinating radiculopathies and chemical radiculitis may not be detected with this particular electrodiagnostic study.  Recommendations: 1.  Follow-up with referring physician. 2.  Continue current management of symptoms.  ___________________________ Laurence Spates FAAPMR Board Certified, American Board of Physical Medicine and Rehabilitation    Nerve Conduction Studies Anti Sensory Summary Table   Stim Site NR Peak (ms) Norm Peak (ms) P-T Amp (V) Norm P-T Amp Site1 Site2 Delta-P (ms) Dist (cm) Vel (m/s) Norm Vel (m/s)  Right Saphenous Anti Sensory (Ant Med Mall)  33.4C  14cm    3.4 <4.4 7.6 >2 14cm Ant Med Mall 3.4 0.0  >32  Right Sup Fibular Anti Sensory (Ant Lat Mall)  33.4C  14 cm    3.2 <4.4 7.0 >5.0 14 cm Ant Lat Mall 3.2 14.0 44 >32  Right Sural Anti Sensory (Lat Mall)  33.3C  Calf    3.6 <4.0 7.7 >5.0 Calf Lat Mall 3.6 14.0 39 >35   Motor Summary Table   Stim Site NR Onset (ms) Norm Onset (ms) O-P Amp (mV) Norm O-P Amp Site1 Site2 Delta-0 (ms) Dist (cm) Vel (m/s) Norm Vel (m/s)  Right Fibular Motor (Ext Dig Brev)  33.2C  Ankle    3.6 <6.1 2.6 >2.5 B Fib Ankle 7.2 36.7 51 >38  B Fib    10.8  2.3  Poplt B Fib 1.9 12.0 63 >40  Poplt    12.7  1.3         Right Tibial Motor (Abd Hall Brev)  33.4C  Ankle    4.2 <6.1 *2.8 >3.0 Knee Ankle 8.4 42.0 50 >35  Knee    12.6  1.8          EMG   Side Muscle Nerve Root Ins Act Fibs Psw Amp Dur Poly Recrt Int Fraser Din Comment  Right AntTibialis Dp Br Peron L4-5 Nml Nml Nml Nml Nml 0 Nml Nml   Right Fibularis Longus  Sup Br Peron L5-S1 Nml Nml Nml Nml Nml 0 Nml Nml   Right MedGastroc Tibial S1-2 Nml Nml Nml Nml Nml 0 Nml Nml   Right VastusMed Femoral  L2-4 Nml Nml Nml Nml Nml 0 Nml Nml   Right BicepsFemS Sciatic L5-S1 Nml Nml Nml Nml Nml 0 Nml Nml     Nerve Conduction Studies Anti Sensory Left/Right Comparison   Stim Site L Lat (ms) R Lat (ms) L-R Lat (ms) L Amp (V) R Amp (V) L-R Amp (%) Site1 Site2 L Vel (m/s) R Vel (m/s) L-R Vel (m/s)  Saphenous Anti Sensory (Ant Med Mall)  33.4C  14cm  3.4   7.6  14cm Ant Med Mall     Sup Fibular Anti Sensory (Ant Lat Mall)  33.4C  14 cm  3.2   7.0  14 cm Ant Lat Mall  44   Sural Anti Sensory (Lat Mall)  33.3C  Calf  3.6   7.7  Calf Lat Mall  39    Motor Left/Right Comparison   Stim Site L Lat (ms) R Lat (ms) L-R Lat (ms) L Amp (mV) R Amp (mV) L-R Amp (%) Site1 Site2 L Vel (m/s) R Vel (m/s) L-R Vel (m/s)  Fibular Motor (Ext Dig Brev)  33.2C  Ankle  3.6   2.6  B Fib Ankle  51   B Fib  10.8   2.3  Poplt B Fib  63   Poplt  12.7   1.3        Tibial Motor (Abd Hall Brev)  33.4C  Ankle  4.2   *2.8  Knee Ankle  50   Knee  12.6   1.8           Waveforms:            Clinical History: MRI LUMBAR SPINE WITHOUT AND WITH CONTRAST  TECHNIQUE: Multiplanar and multiecho pulse sequences of the lumbar spine were obtained without and with intravenous contrast.  CONTRAST:  25m MULTIHANCE GADOBENATE DIMEGLUMINE 529 MG/ML IV SOLN  COMPARISON:  Radiography 08/04/2017. CT abdomen 05/23/2017. MRI 11/19/2015.  FINDINGS: Segmentation: 5 lumbar type vertebral bodies as numbered previously.  Alignment:  Thoracolumbar curvature convex to the left.  Vertebrae:  No primary bone finding.  Conus medullaris and cauda equina: Conus extends to the L1 level. Conus and cauda equina appear normal.  Paraspinal and other soft tissues: None significant.  Disc levels:  Chronic thoracolumbar fusion. Residual paraspinous rod on the right without attachment devices. Apparent solid fusion from the lower thoracic region at least to L2. Sufficient patency of the canal and foramina.  L3-4:  Previous discectomy and fusion procedure. Extensive artifact. Based on the previous CT, there is consideration of nonunion at this level. No compressive canal stenosis. Foramina obscured by artifact.  L4-5: Previous discectomy and fusion procedure. Fusion appears solid on the previous CT. No apparent compressive canal or  foraminal narrowing.  L5-S1: Bulging of the disc more prominent towards the left with left foraminal encroachment by osteophyte and disc material that could affect the left L5 nerve. Foramen on the right is widely patent.  IMPRESSION: Distant thoracolumbar fusion. Residual Harrington rod segment on the right, without visible attachments. No stenosis at L2-3 or above.  Previous discectomy and fusion procedure at L3-4. Based on the CT scan of May, there may be nonunion at this level. No compressive canal stenosis. Foraminal detail limited because of hardware artifact.  Previous discectomy and fusion procedure at L4-5. Fusion looks solid on the previous CT. Sufficient patency of the canal and foramina.  L5-S1 left foraminal to extraforaminal encroachment by osteophyte in disc material that could affect the left L5 nerve.   Electronically Signed   By: Nelson Chimes M.D.   On: 01/08/2018 14:41   She reports that she has never smoked. She has never used smokeless tobacco. No results for input(s): HGBA1C, LABURIC in the last 8760 hours.  Objective:  VS:  HT:    WT:   BMI:     BP:   HR: bpm  TEMP: ( )  RESP:  Physical Exam Musculoskeletal:     Right lower leg: No edema.     Left lower leg: No edema.     Comments: Examination of both lower limbs shows good strength in the lower limbs particularly with dorsiflexion plantarflexion EHL.  There is no atrophy of the intrinsic foot muscles.  She has a negative slump test bilaterally.  She has intact sensation bilaterally and symmetric.  She does feel that dysesthesia on the right compared to left.  Neurological:      General: No focal deficit present.     Mental Status: She is oriented to person, place, and time.  Psychiatric:        Mood and Affect: Mood normal.        Behavior: Behavior normal.     Ortho Exam Imaging: No results found.  Past Medical/Family/Surgical/Social History: Medications & Allergies reviewed per EMR, new medications updated. Patient Active Problem List   Diagnosis Date Noted  . Acute encephalopathy 05/15/2017  . UTI (urinary tract infection) 05/15/2017  . Hypotension 02/10/2017  . 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 Malignant neoplasm of base of tongue ([place]) 10/29/2016  . 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[place]) 10/29/2016  . Headache associated with sexual activity 05/14/2016  . Tongue lesion 05/14/2016  . Loose stools 03/12/2016  . Hypocalcemia 01/22/2016  . Thrombocytopenia ([place]) 01/22/2016  . Chronic diarrhea 01/22/2016  . Chest pain   . Essential hypertension   . Orthostatic hypotension   . Spinal stenosis of lumbar region 01/20/2016    Class: Chronic  . DDD (degenerative disc disease), lumbar 01/20/2016    Class: Chronic  . Spinal stenosis of lumbar region with neurogenic claudication 01/20/2016  . Low back pain 12/22/2015  . Pre-op examination 12/22/2015  . Hypersomnolence 10/13/2015  . Muscle cramp 08/01/2015  . GERD (gastroesophageal reflux disease) 07/09/2015  . Excessive daytime sleepiness 06/18/2015  . Shingles 08/28/2013  . Postop check 08/28/2013  . Cholelithiasis 07/10/2013  . Colitis 01/08/2013  . Postoperative anemia 01/08/2013  . Morbid obesity with BMI of 40.0-44.9, adult ([place]) 01/08/2013   Past Medical History:  Diagnosis Date  . Allergy   . Anemia    Iron deficiency  . Anxiety   . Arthritis    back, left shoulder  . Cancer ([place])   .  Chicken pox   . Depression   . Diarrhea    takes Imodium daily  . GERD (gastroesophageal reflux disease)   . H/O hiatal hernia   . Headache(784.0)   .  History of kidney stones    1996ish  . History of radiation therapy 11/16/16- 01/01/17   Base of Tongue/ 66 gy in 33 fractions/ Dose: 2 Gy  . Hypertension   . Migraine    none for 5 years (as of 01/13/16)  . OSA (obstructive sleep apnea) 10/13/2015   unable to get cpap, plans to get one in 2018  . Pneumonia   . Restless legs   . Scoliosis   . Shortness of breath    with exertion   Family History  Problem Relation Age of Onset  . Heart disease Father   . Heart attack Father   . Hypertension Mother   . Arthritis Mother   . Diabetes Maternal Grandmother   . Diabetes Paternal Grandmother   . Diabetes Maternal Uncle    Past Surgical History:  Procedure Laterality Date  . BACK SURGERY  07/21/1978  . CHOLECYSTECTOMY N/A 08/15/2013   Procedure: LAPAROSCOPIC CHOLECYSTECTOMY WITH INTRAOPERATIVE CHOLANGIOGRAM;  Surgeon: Gwenyth Ober, MD;  Location: [facility];  Service: General;  Laterality: N/A;  . COLONOSCOPY N/A 01/09/2013   Procedure: COLONOSCOPY;  Surgeon: Beryle Beams, MD;  Location: [facility];  Service: Endoscopy;  Laterality: N/A;  . DIRECT LARYNGOSCOPY  07/2016   Dr. Nicolette [facility]  . GASTROSTOMY TUBE PLACEMENT  09/27/2016  . HERNIA REPAIR Left 1981  . IR PATIENT EVAL TECH 0-60 MINS  12/13/2016  . IR PATIENT EVAL TECH 0-60 MINS  04/11/2017  . IR REPLACE G-TUBE SIMPLE WO FLUORO  12/12/2017  . MODIFIED RADICAL NECK DISSECTION Left 09/27/2016   Levels 1 & 2  . PARTIAL GLOSSECTOMY Left 09/27/2016   Left hemi partial glossectomy  . SPINE SURGERY  01/20/2016   fusion  . TONSILLECTOMY    . tracheotomy  09/27/2016  . TUBAL LIGATION  06/1988   Social History   Occupational History  . Occupation: Air cabin crew  Tobacco Use  . Smoking status: Never Smoker  . Smokeless tobacco: Never Used  Substance and Sexual Activity  . Alcohol use: Yes    Alcohol/week: 1.0 standard drinks    Types: 1 Cans of beer per week  . Drug use: No  . Sexual activity: Yes    Birth  control/protection: Surgical

## 2018-03-24 ENCOUNTER — Other Ambulatory Visit (HOSPITAL_COMMUNITY): Payer: Self-pay | Admitting: Interventional Radiology

## 2018-03-24 ENCOUNTER — Ambulatory Visit
Admission: RE | Admit: 2018-03-24 | Discharge: 2018-03-24 | Disposition: A | Discharge status: Home / Self Care | Payer: BC Managed Care – PPO | Source: Ambulatory Visit | Attending: Radiation Oncology | Admitting: Radiation Oncology

## 2018-03-24 ENCOUNTER — Other Ambulatory Visit: Payer: Self-pay

## 2018-03-24 ENCOUNTER — Encounter: Payer: Self-pay | Admitting: Radiation Oncology

## 2018-03-24 ENCOUNTER — Ambulatory Visit (HOSPITAL_COMMUNITY)
Admission: RE | Admit: 2018-03-24 | Discharge: 2018-03-24 | Disposition: A | Discharge status: Home / Self Care | Payer: BC Managed Care – PPO | Source: Ambulatory Visit | Attending: Radiation Oncology | Admitting: Radiation Oncology

## 2018-03-24 ENCOUNTER — Ambulatory Visit (HOSPITAL_COMMUNITY)
Admission: RE | Admit: 2018-03-24 | Discharge: 2018-03-24 | Disposition: A | Discharge status: Home / Self Care | Payer: BC Managed Care – PPO | Source: Ambulatory Visit | Attending: Interventional Radiology | Admitting: Interventional Radiology

## 2018-03-24 VITALS — BP 136/87 | HR 61 | Temp 98.2°F | Resp 18 | Ht 69.0 in | Wt 185.4 lb

## 2018-03-24 DIAGNOSIS — R633 Feeding difficulties, unspecified: Secondary | ICD-10-CM

## 2018-03-24 DIAGNOSIS — Z79899 Other long term (current) drug therapy: Secondary | ICD-10-CM | POA: Diagnosis not present

## 2018-03-24 DIAGNOSIS — C01 Malignant neoplasm of base of tongue: Secondary | ICD-10-CM

## 2018-03-24 DIAGNOSIS — Z85819 Personal history of malignant neoplasm of unspecified site of lip, oral cavity, and pharynx: Secondary | ICD-10-CM | POA: Insufficient documentation

## 2018-03-24 DIAGNOSIS — C760 Malignant neoplasm of head, face and neck: Secondary | ICD-10-CM | POA: Diagnosis not present

## 2018-03-24 DIAGNOSIS — Z431 Encounter for attention to gastrostomy: Secondary | ICD-10-CM

## 2018-03-24 DIAGNOSIS — Z7982 Long term (current) use of aspirin: Secondary | ICD-10-CM | POA: Insufficient documentation

## 2018-03-24 DIAGNOSIS — R1319 Other dysphagia: Secondary | ICD-10-CM | POA: Diagnosis not present

## 2018-03-24 DIAGNOSIS — R634 Abnormal weight loss: Secondary | ICD-10-CM

## 2018-03-24 HISTORY — PX: IR PATIENT EVAL TECH 0-60 MINS: IMG5564

## 2018-03-24 LAB — TSH: TSH: 3.025 u[IU]/mL (ref 0.350–4.500)

## 2018-03-24 MED ORDER — SILVER NITRATE-POT NITRATE 75-25 % EX MISC
CUTANEOUS | Status: AC
  Filled 2018-03-24: qty 1

## 2018-03-24 MED ORDER — LARYNGOSCOPY SOLUTION RAD-ONC
15.0000 mL | Freq: Once | TOPICAL | Status: AC
  Administered 2018-03-24: 15 mL via TOPICAL
  Filled 2018-03-24: qty 15

## 2018-03-24 NOTE — Progress Notes (Signed)
Radiation Oncology         [phone] ________________________________  Name: Kristen [name] MRN: [id number]  Date: 03/24/2018  DOB: [birth year]  Follow-Up Visit Note  CC: [name], MD  Francina Ames, MD  Diagnosis and Prior Radiotherapy:       ICD-10-CM   1. 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  11/16/2016 - 01/01/2017: Tongue / 66 Gy in 33 fractions.  CHIEF COMPLAINT:  Here for follow-up and surveillance of head and neck cancer  Narrative:  The patient returns today for routine follow-up. She is now a year and 2.5 months out from radiation. She last saw Dr. Nicolette Bang on 03/07/2018 and on mirror exam, her throat was clear to the epiglottis. She will return on 05/30/2018. She still has a feeding tube in place, which she uses for medications only. She states she eats slowly and with small bites, but she denies modifying her diet.  On review of systems, she reports a dry throat. She reports that she will occasionally lose her voice or become hoarse. She states it will happen every once in a while and will sometimes take a few days to recover her voice.   Pain issues, if any: She reports chronic sore throat that makes it difficult to swallow. She reports her PEG tube is painful and is scheduled to have it evaluated with Interventional Radiology.    Weight changes, if any: none  Wt Readings from Last 3 Encounters:  03/24/18 185 lb 6.4 oz (84.1 kg)  03/15/18 182 lb 3.2 oz (82.6 kg)  02/16/18 182 lb (82.6 kg)    ALLERGIES:  is allergic to other and sulfur.  Meds: Current Outpatient Medications  Medication Sig Dispense Refill  . amLODipine (NORVASC) 10 MG tablet TAKE 1 TABLET BY MOUTH DAILY 90 tablet 3  . amphetamine-dextroamphetamine (ADDERALL) 10 MG tablet 1-3 daily as needed for sleepiness 90 tablet 0  . aspirin 81 MG chewable tablet Place 81 mg into feeding tube daily.     .  baclofen (LIORESAL) 10 MG tablet Place 1 tablet (10 mg total) into feeding tube 3 (three) times daily. 30 each 4  . calcium carbonate (OSCAL) 1500 (600 Ca) MG TABS tablet 600 mg of elemental calcium 2 (two) times daily with a meal. Per tube    . diclofenac (VOLTAREN) 75 MG EC tablet TAKE 1 TABLET BY MOUTH TWICE DAILY WITH FOOD AS NEEDED FOR PAIN 180 tablet 3  . diphenoxylate-atropine (LOMOTIL) 2.5-0.025 MG tablet TAKE 1 TO 2 TABLETS BY MOUTH EVERY 6 HOURS BEFORE MEALS AND AT BEDTIME 90 tablet 0  . ergocalciferol (DRISDOL) 8000 UNIT/ML drops Place 6 mLs (48,000 Units total) into feeding tube once a week. 60 mL 1  . gabapentin (NEURONTIN) 300 MG capsule Take 1 capsule (300 mg total) by mouth 3 (three) times daily. (Patient taking differently: Place 300 mg into feeding tube 3 (three) times daily. ) 270 capsule 3  . hydrocortisone cream 1 % Apply 1 application topically 2 (two) times daily. 30 g 0  . LORazepam (ATIVAN) 0.5 MG tablet Take 1 tab po at the MRI scanner for anxiety. 1 tablet 0  . magic mouthwash w/lidocaine SOLN Use 2 teaspoons swish and spit every 4-6 hours prn 150 mL 0  . meloxicam (MOBIC) 15 MG tablet Take 1 tablet (15 mg total) by mouth daily. 30 tablet 2  . pantoprazole (PROTONIX) 40  MG tablet TAKE 1 TABLET BY MOUTH ONCE DAILY 90 tablet 3  . Potassium 99 MG TABS 1 tablet (99 mg total) by Gastrostomy Tube route daily. (Patient taking differently: Place 1 tablet into feeding tube daily. ) 330 tablet 1  . SUMAtriptan (IMITREX) 100 MG tablet Take 0.5-1 tablet by mouth at the onset of migraine and may repeat in 2 hours if headache persists or recurs. 10 tablet 0   No current facility-administered medications for this encounter.     Physical Findings: Wt Readings from Last 3 Encounters:  03/24/18 185 lb 6.4 oz (84.1 kg)  03/15/18 182 lb 3.2 oz (82.6 kg)  02/16/18 182 lb (82.6 kg)    height is 5' 9"  (1.753 m) and weight is 185 lb 6.4 oz (84.1 kg). Her oral temperature is 98.2 F (36.8  C). Her blood pressure is 136/87 and her pulse is 61. Her respiration is 18 and oxygen saturation is 98%. .  General: Alert and oriented, in no acute distress HEENT: Head is normocephalic. Extraocular movements are intact. Tongue deviates to the left. No lesions appreciated in the oral cavity or upper throat. No palpable lesions over posterior tongue.  Neck: Nice and flat, no palpable masses appreciated.  Heart: Regular in rate and rhythm with no murmurs, rubs, or gallops. Chest: Clear to auscultation bilaterally, with no rhonchi, wheezes, or rales.   PROCEDURE NOTE: After obtaining consent and anesthetizing the nasal cavity with topical lidocaine and phenylephrine, the flexible endoscope was introduced and passed through the nasal cavity. Left lateral vallecula shows   brown mucosal crusting. No other  lesions in the pharynx/larynx, and the true vocal cords are clear with symmetric mobility.   Lab Findings: Lab Results  Component Value Date   WBC 3.5 (L) 07/27/2017   HGB 11.6 (L) 07/27/2017   HCT 35.5 (L) 07/27/2017   MCV 84.7 07/27/2017   PLT 171.0 07/27/2017    Lab Results  Component Value Date   TSH 1.40 07/27/2017    Radiographic Findings: No results found.  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  3) Follow up in 1 year. CXR today. The patient was encouraged to call with any issues or questions before then.  4) For dry mouth, try biotene products.  If not affordable, try a little canola oil to lubricate mouth/throat PRN.  I spent 25 minutes face to face with the patient and more than 50% of that time was spent in counseling and/or coordination of care.    _____   Eppie Gibson, MD  This document serves as a  record of services personally performed by Eppie Gibson, MD. It was created on her behalf by Wilburn Mylar, a trained medical scribe. The creation of this record is based on the scribe's personal observations and the provider's statements to them. This document has been checked and approved by the attending provider.

## 2018-03-24 NOTE — Procedures (Signed)
Patient came in today with complaint of pain around insertion site secondary to granuloma tissue.  She has previous had similar issues that were successfully treated with silver nitrate.  I consulted with Dr Kathlene Cote and he agreed that silver niotrate may help the patient symptoms. The patient felt significant improvement after the treatment application.  Sh ewas advised to call us if she has any further problems.

## 2018-03-28 ENCOUNTER — Encounter: Payer: Self-pay | Admitting: Internal Medicine

## 2018-03-28 ENCOUNTER — Encounter (INDEPENDENT_AMBULATORY_CARE_PROVIDER_SITE_OTHER): Payer: Self-pay | Admitting: Specialist

## 2018-03-28 ENCOUNTER — Other Ambulatory Visit: Payer: Self-pay

## 2018-03-28 ENCOUNTER — Ambulatory Visit (AMBULATORY_SURGERY_CENTER): Payer: BC Managed Care – PPO | Admitting: *Deleted

## 2018-03-28 VITALS — Ht 69.0 in | Wt 183.0 lb

## 2018-03-28 DIAGNOSIS — K529 Noninfective gastroenteritis and colitis, unspecified: Secondary | ICD-10-CM

## 2018-03-28 DIAGNOSIS — K219 Gastro-esophageal reflux disease without esophagitis: Secondary | ICD-10-CM

## 2018-03-28 DIAGNOSIS — R1312 Dysphagia, oropharyngeal phase: Secondary | ICD-10-CM

## 2018-03-28 MED ORDER — SUPREP BOWEL PREP KIT 17.5-3.13-1.6 GM/177ML PO SOLN: 1.0000 | Freq: Once | ORAL | 0 refills | Status: AC

## 2018-03-28 NOTE — Progress Notes (Signed)
No egg or soy allergy known to patient  No issues with past sedation with any surgeries  or procedures, no intubation problems  No diet pills per patient No home 02 use per patient  No blood thinners per patient  Pt denies issues with constipation  No A fib or A flutter  EMMI video sent to pt's e mail  suprep sample given : lot # [id number], exp 01/2020 Patient will bring extra dressing for her g tube the day of the procedure

## 2018-03-29 ENCOUNTER — Other Ambulatory Visit (HOSPITAL_COMMUNITY): Payer: Self-pay | Admitting: Radiology

## 2018-03-29 ENCOUNTER — Ambulatory Visit (HOSPITAL_COMMUNITY)
Admission: RE | Admit: 2018-03-29 | Discharge: 2018-03-29 | Disposition: A | Discharge status: Home / Self Care | Payer: BC Managed Care – PPO | Source: Ambulatory Visit | Attending: Radiology | Admitting: Radiology

## 2018-03-29 ENCOUNTER — Other Ambulatory Visit: Payer: Self-pay

## 2018-03-29 ENCOUNTER — Encounter (HOSPITAL_COMMUNITY): Payer: Self-pay | Admitting: Radiology

## 2018-03-29 DIAGNOSIS — R633 Feeding difficulties, unspecified: Secondary | ICD-10-CM

## 2018-03-29 DIAGNOSIS — Z431 Encounter for attention to gastrostomy: Secondary | ICD-10-CM | POA: Insufficient documentation

## 2018-03-29 HISTORY — PX: IR REPLACE G-TUBE SIMPLE WO FLUORO: IMG2323

## 2018-03-29 NOTE — Procedures (Signed)
Patient's existing 27 French balloon retention gastrostomy tube was replaced with new 71 French balloon retention  gastrostomy tube without immediate complications. 8 cc saline placed into the balloon.  Tube secured to skin site.EBL none.

## 2018-04-06 ENCOUNTER — Ambulatory Visit (INDEPENDENT_AMBULATORY_CARE_PROVIDER_SITE_OTHER): Payer: BC Managed Care – PPO | Admitting: Specialist

## 2018-04-06 ENCOUNTER — Encounter (INDEPENDENT_AMBULATORY_CARE_PROVIDER_SITE_OTHER): Payer: Self-pay | Admitting: Specialist

## 2018-04-06 ENCOUNTER — Other Ambulatory Visit: Payer: Self-pay

## 2018-04-06 VITALS — BP 130/83 | HR 62 | Ht 69.0 in | Wt 182.0 lb

## 2018-04-06 DIAGNOSIS — Z981 Arthrodesis status: Secondary | ICD-10-CM

## 2018-04-06 DIAGNOSIS — T84498A Other mechanical complication of other internal orthopedic devices, implants and grafts, initial encounter: Secondary | ICD-10-CM | POA: Diagnosis not present

## 2018-04-06 DIAGNOSIS — M5136 Other intervertebral disc degeneration, lumbar region: Secondary | ICD-10-CM | POA: Diagnosis not present

## 2018-04-06 DIAGNOSIS — Z4889 Encounter for other specified surgical aftercare: Secondary | ICD-10-CM

## 2018-04-06 MED ORDER — TRAMADOL HCL 50 MG PO TABS: 100.0000 mg | ORAL_TABLET | Freq: Two times a day (BID) | ORAL | 0 refills | Status: DC

## 2018-04-06 NOTE — Patient Instructions (Signed)
Avoid frequent bending and stooping  No lifting greater than 10 lbs. May use ice or moist heat for pain. Weight loss is of benefit. Best medication for lumbar disc disease is arthritis medications like motrin, celebrex and naprosyn. Exercise is important to improve your indurance and does allow people to function better inspite of back pain.  Obtain CT Scan of lumbar spine as there is breakage of the transverse loading rod at the L4-5 level suggests that there may be a nonunion of previous fusion L3-4 or L4-5.

## 2018-04-06 NOTE — Progress Notes (Signed)
Office Visit Note   Patient: Kristen [name]           Date of Birth: [birth year]           MRN: [id number] Visit Date: 04/06/2018              Requested by: [name], MD [place], [place] [address] PCP: [name], MD   Assessment & Plan: Visit Diagnoses:  1. Status post lumbar spinal fusion   2. Encounter for other specified surgical aftercare   3. Degenerative disc disease, lumbar   4. Loosening of hardware in spine [facility])     Plan: Avoid frequent bending and stooping  No lifting greater than 10 lbs. May use ice or moist heat for pain. Weight loss is of benefit. Best medication for lumbar disc disease is arthritis medications like motrin, celebrex and naprosyn. Exercise is important to improve your indurance and does allow people to function better inspite of back pain.  Obtain CT Scan of lumbar spine as there is breakage of the transverse loading rod at the L4-5 level suggests that there may be a nonunion of previous fusion L3-4 or L4-5.   Follow-Up Instructions: Return in about 3 weeks (around 04/27/2018).   Orders:  Orders Placed This Encounter  Procedures  . CT LUMBAR SPINE WO CONTRAST   Meds ordered this encounter  Medications  . traMADol (ULTRAM) 50 MG tablet    Sig: Take 2 tablets (100 mg total) by mouth 2 (two) times daily.    Dispense:  30 tablet    Refill:  0      Procedures: No procedures performed   Clinical Data: Findings:  Editor: Magnus Sinning, MD (Physician) Procedure Orders: 1. NCV with EMG (electromyography) ([id number]) ordered by Magnus Sinning, MD at 03/16/18 [phone]  Pre-procedure Diagnoses 1. Pain in right leg (M79.604) 2. Paresthesia of skin (R20.2)    EMG & NCV Findings: Evaluation of the right tibial motor nerve showed reduced amplitude (2.8 mV).  All remaining nerves (as indicated in the following tables) were within normal limits.    All examined muscles (as indicated in the following table)  showed no evidence of electrical instability.    Impression: Essentially NORMAL electrodiagnostic study of the right lower limb.  There is no significant electrodiagnostic evidence of nerve entrapment, lumbosacral plexopathy, lumbar radiculopathy or generalized peripheral neuropathy.    As you know, purely sensory or demyelinating radiculopathies and chemical radiculitis may not be detected with this particular electrodiagnostic study.  Recommendations: 1.  Follow-up with referring physician. 2.  Continue current management of symptoms.  ___________________________ Kristen [name] Board Certified, American Board of Physical Medicine and Rehabilitation        Subjective: Chief Complaint  Patient presents with  . Lower Back - Follow-up  . Right Leg - Follow-up    EMG/NCS review    53 year old female with history of Thoracolumbar fusion T5  to L3 extend to L3-4 and to L4-5. She had the recent EMG/NCV right leg due to persisting pain in the right leg with standing and walking. Her pain is into the right leg into the whole right foot. She has had recent radiation therapy to the neck and throat post glossectomy for a oral cancer. She has a G tube but does not use it but describes significant dysphagia symptoms and she is still on some amount of meds, she takes tramadol, baclofen, diclofenac, gabapentin.  Does take intermittant meloxicam.   Review  of Systems  Constitutional: Negative.   HENT: Negative.   Eyes: Negative.   Respiratory: Negative.   Cardiovascular: Negative.   Gastrointestinal: Negative.   Endocrine: Negative.   Genitourinary: Negative.   Musculoskeletal: Negative.   Skin: Negative.   Allergic/Immunologic: Negative.   Neurological: Negative.   Hematological: Negative.   Psychiatric/Behavioral: Negative.      Objective: Vital Signs: BP 130/83 (BP Location: Left Arm, Patient Position: Sitting)   Pulse 62   Ht 5' 9"  (1.753 m)   Wt 182 lb (82.6 kg)    LMP 11/15/2013   BMI 26.88 kg/m   Physical Exam  Ortho Exam  Specialty Comments:  No specialty comments available.  Imaging: No results found.   PMFS History: Patient Active Problem List   Diagnosis Date Noted  . Spinal stenosis of lumbar region 01/20/2016    Priority: High    Class: Chronic  . DDD (degenerative disc disease), lumbar 01/20/2016    Priority: High    Class: Chronic  . Acute encephalopathy 05/15/2017  . UTI (urinary tract infection) 05/15/2017  . Hypotension 02/10/2017  . 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 Malignant neoplasm of base of tongue ([place]) 10/29/2016  . 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[place]) 10/29/2016  . Headache associated with sexual activity 05/14/2016  . Tongue lesion 05/14/2016  . Loose stools 03/12/2016  . Hypocalcemia 01/22/2016  . Thrombocytopenia ([place]) 01/22/2016  . Chronic diarrhea 01/22/2016  . Chest pain   . Essential hypertension   . Orthostatic hypotension   . Spinal stenosis of lumbar region with neurogenic claudication 01/20/2016  . Low back pain 12/22/2015  . Pre-op examination 12/22/2015  . Hypersomnolence 10/13/2015  . Muscle cramp 08/01/2015  . GERD (gastroesophageal reflux disease) 07/09/2015  . Excessive daytime sleepiness 06/18/2015  . Shingles 08/28/2013  . Postop check 08/28/2013  . Cholelithiasis 07/10/2013  . Colitis 01/08/2013  . Postoperative anemia 01/08/2013  . Morbid obesity with BMI of 40.0-44.9, adult ([place]) 01/08/2013   Past Medical History:  Diagnosis Date  . Allergy   . Anemia    Iron deficiency  . Anxiety   . Arthritis    back, left shoulder  . Blood transfusion without reported diagnosis   . Cancer ([place])   . Chicken pox   . Depression   . Diarrhea    takes Imodium daily  . GERD (gastroesophageal reflux disease)   . H/O hiatal hernia   . Headache(784.0)   . History of kidney stones    1996ish  . History of radiation therapy 11/16/16- 01/01/17   Base of  Tongue/ 66 gy in 33 fractions/ Dose: 2 Gy  . Hypertension   . Migraine    none for 5 years (as of 01/13/16)  . OSA (obstructive sleep apnea) 10/13/2015   unable to get cpap, plans to get one in 2018  . Pneumonia   . Restless legs   . Scoliosis   . Shortness of breath    with exertion  . Sleep apnea     Family History  Problem Relation Age of Onset  . Heart disease Father   . Heart attack Father   . Hypertension Mother   . Arthritis Mother   . Diabetes Maternal Grandmother   . Diabetes Paternal Grandmother   . Diabetes Maternal Uncle   . Colon polyps Neg Hx   . Colon cancer Neg Hx   . Esophageal cancer Neg Hx   . Stomach cancer Neg Hx   .  Rectal cancer Neg Hx     Past Surgical History:  Procedure Laterality Date  . BACK SURGERY  07/21/1978  . CHOLECYSTECTOMY N/A 08/15/2013   Procedure: LAPAROSCOPIC CHOLECYSTECTOMY WITH INTRAOPERATIVE CHOLANGIOGRAM;  Surgeon: Gwenyth Ober, MD;  Location: [facility];  Service: General;  Laterality: N/A;  . COLONOSCOPY N/A 01/09/2013   Procedure: COLONOSCOPY;  Surgeon: Beryle Beams, MD;  Location: [facility];  Service: Endoscopy;  Laterality: N/A;  . DIRECT LARYNGOSCOPY  07/2016   Dr. Nicolette Bang [facility]  . GASTROSTOMY TUBE PLACEMENT  09/27/2016  . HERNIA REPAIR Left 1981  . IR PATIENT EVAL TECH 0-60 MINS  12/13/2016  . IR PATIENT EVAL TECH 0-60 MINS  04/11/2017  . IR PATIENT EVAL TECH 0-60 MINS  03/24/2018  . IR REPLACE G-TUBE SIMPLE WO FLUORO  12/12/2017  . IR REPLACE G-TUBE SIMPLE WO FLUORO  03/29/2018  . MODIFIED RADICAL NECK DISSECTION Left 09/27/2016   Levels 1 & 2  . PARTIAL GLOSSECTOMY Left 09/27/2016   Left hemi partial glossectomy  . SPINE SURGERY  01/20/2016   fusion  . TONSILLECTOMY    . tracheotomy  09/27/2016  . TUBAL LIGATION  06/1988   Social History   Occupational History  . Occupation: Air cabin crew  Tobacco Use  . Smoking status: Never Smoker  . Smokeless tobacco: Never Used  Substance and Sexual Activity  . Alcohol  use: Yes    Alcohol/week: 7.0 standard drinks    Types: 7 Cans of beer per week  . Drug use: No  . Sexual activity: Yes    Birth control/protection: Surgical

## 2018-04-07 ENCOUNTER — Encounter (INDEPENDENT_AMBULATORY_CARE_PROVIDER_SITE_OTHER): Payer: Self-pay | Admitting: Specialist

## 2018-04-09 ENCOUNTER — Telehealth: Payer: Self-pay

## 2018-04-09 NOTE — Telephone Encounter (Signed)
Covid-19 travel screening questions  Have you traveled in the last 14 days? If yes where?  Do you now or have you had a fever in the last 14 days?  Do you have any respiratory symptoms of shortness of breath or cough now or in the last 14 days?  Do you have a medical history of Congestive Heart Failure?  Do you have a medical history of lung disease?  Do you have any family members or close contacts with diagnosed or suspected Covid-19?    Left detailed message at 10:40 about screening questions and our "No care partner in the lobby" policy. I also gave her our number to call if she had any further questions.

## 2018-04-10 ENCOUNTER — Ambulatory Visit (AMBULATORY_SURGERY_CENTER): Payer: BC Managed Care – PPO | Admitting: Internal Medicine

## 2018-04-10 ENCOUNTER — Encounter: Payer: Self-pay | Admitting: Internal Medicine

## 2018-04-10 ENCOUNTER — Other Ambulatory Visit: Payer: Self-pay

## 2018-04-10 VITALS — BP 123/86 | HR 61 | Temp 97.8°F | Resp 14 | Ht 69.0 in | Wt 183.0 lb

## 2018-04-10 DIAGNOSIS — R1310 Dysphagia, unspecified: Secondary | ICD-10-CM | POA: Diagnosis present

## 2018-04-10 DIAGNOSIS — K3189 Other diseases of stomach and duodenum: Secondary | ICD-10-CM

## 2018-04-10 DIAGNOSIS — K529 Noninfective gastroenteritis and colitis, unspecified: Secondary | ICD-10-CM

## 2018-04-10 DIAGNOSIS — B370 Candidal stomatitis: Secondary | ICD-10-CM

## 2018-04-10 DIAGNOSIS — K6389 Other specified diseases of intestine: Secondary | ICD-10-CM

## 2018-04-10 MED ORDER — SODIUM CHLORIDE 0.9 % IV SOLN: 500.0000 mL | Freq: Once | INTRAVENOUS | Status: DC

## 2018-04-10 MED ORDER — FLUCONAZOLE 100 MG PO TABS: 100.0000 mg | ORAL_TABLET | Freq: Every day | ORAL | 0 refills | Status: AC

## 2018-04-10 NOTE — Op Note (Signed)
[facility] Patient Name: Kristen [name] Procedure Date: 04/10/2018 9:29 AM MRN: [id number] Endoscopist: Gatha Mayer , MD Age: 53 Referring MD:  Date of Birth: [birth year] Gender: Female Account #: [id number] Procedure:                Colonoscopy Indications:              Chronic diarrhea, Clinically significant diarrhea                            of unexplained origin Medicines:                Propofol per Anesthesia, Monitored Anesthesia Care Procedure:                Pre-Anesthesia Assessment:                           - Prior to the procedure, a History and Physical                            was performed, and patient medications and                            allergies were reviewed. The patient's tolerance of                            previous anesthesia was also reviewed. The risks                            and benefits of the procedure and the sedation                            options and risks were discussed with the patient.                            All questions were answered, and informed consent                            was obtained. Prior Anticoagulants: The patient has                            taken no previous anticoagulant or antiplatelet                            agents. ASA Grade Assessment: II - A patient with                            mild systemic disease. After reviewing the risks                            and benefits, the patient was deemed in                            satisfactory condition to undergo the procedure.  After obtaining informed consent, the colonoscope                            was passed under direct vision. Throughout the                            procedure, the patient's blood pressure, pulse, and                            oxygen saturations were monitored continuously. The                            Colonoscope was introduced through the anus and                            advanced to  the the cecum, identified by                            appendiceal orifice and ileocecal valve. The                            colonoscopy was somewhat difficult due to                            significant looping. Successful completion of the                            procedure was aided by using manual pressure. The                            patient tolerated the procedure well. The quality                            of the bowel preparation was adequate. The                            ileocecal valve, appendiceal orifice, and rectum                            were photographed. The bowel preparation used was                            SUPREP. Scope In: 9:45:45 AM Scope Out: 10:03:59 AM Scope Withdrawal Time: 0 hours 7 minutes 11 seconds  Total Procedure Duration: 0 hours 18 minutes 14 seconds  Findings:                 The perianal examination was normal.                           The digital rectal exam findings include decreased                            sphincter tone.  A patchy area of mildly erythematous and eroded                            mucosa was found in the distal sigmoid colon.                            Biopsies were taken with a cold forceps for                            histology. Verification of patient identification                            for the specimen was done. Estimated blood loss was                            minimal.                           Multiple diverticula were found in the left colon.                           The exam was otherwise without abnormality on                            direct and retroflexion views.                           Biopsies for histology were taken with a cold                            forceps from the right colon and left colon for                            evaluation of microscopic colitis. Estimated blood                            loss was minimal. Complications:            No  immediate complications. Estimated Blood Loss:     Estimated blood loss was minimal. Impression:               - Decreased sphincter tone found on digital rectal                            exam.                           - Erythematous and eroded mucosa in the distal                            sigmoid colon. Biopsied.                           - Diverticulosis in the left colon.                           -  The examination was otherwise normal on direct                            and retroflexion views.                           - Biopsies were taken with a cold forceps from the                            right colon and left colon for evaluation of                            microscopic colitis. Looping would not allow entry                            of terminal ileum                           -Cecum had gritty debris that I could not clear but                            remainder of colon exam was actually good Recommendation:           - Patient has a contact number available for                            emergencies. The signs and symptoms of potential                            delayed complications were discussed with the                            patient. Return to normal activities tomorrow.                            Written discharge instructions were provided to the                            patient.                           - Resume previous diet.                           - Continue present medications.                           - Await pathology results.                           - Repeat colonoscopy is recommended. The                            colonoscopy date will be determined after pathology  results from today's exam become available for                            review. Gatha Mayer, MD 04/10/2018 10:22:32 AM This report has been signed electronically.

## 2018-04-10 NOTE — Patient Instructions (Addendum)
There is thrush in the esophagus. I think that is causing your swallowing problems.  Will treat with fluconazole.  The colon looks ok - I took biopsies to understand the diarrhea.  I should be able to call later this week.  I appreciate the opportunity to care for you. Gatha Mayer, MD, FACG   YOU HAD AN ENDOSCOPIC PROCEDURE TODAY AT [facility]:   Refer to the procedure report that was given to you for any specific questions about what was found during the examination.  If the procedure report does not answer your questions, please call your gastroenterologist to clarify.  If you requested that your care partner not be given the details of your procedure findings, then the procedure report has been included in a sealed envelope for you to review at your convenience later.  YOU SHOULD EXPECT: Some feelings of bloating in the abdomen. Passage of more gas than usual.  Walking can help get rid of the air that was put into your GI tract during the procedure and reduce the bloating. If you had a lower endoscopy (such as a colonoscopy or flexible sigmoidoscopy) you may notice spotting of blood in your stool or on the toilet paper. If you underwent a bowel prep for your procedure, you may not have a normal bowel movement for a few days.  Please Note:  You might notice some irritation and congestion in your nose or some drainage.  This is from the oxygen used during your procedure.  There is no need for concern and it should clear up in a day or so.  SYMPTOMS TO REPORT IMMEDIATELY:   Following lower endoscopy (colonoscopy or flexible sigmoidoscopy):  Excessive amounts of blood in the stool  Significant tenderness or worsening of abdominal pains  Swelling of the abdomen that is new, acute  Fever of 100F or higher   Following upper endoscopy (EGD)  Vomiting of blood or coffee ground material  New chest pain or pain under the shoulder blades  Painful or persistently  difficult swallowing  New shortness of breath  Fever of 100F or higher  Black, tarry-looking stools  For urgent or emergent issues, a gastroenterologist can be reached at any hour by calling [phone].   DIET:  We do recommend a small meal at first, but then you may proceed to your regular diet.  Drink plenty of fluids but you should avoid alcoholic beverages for 24 hours.  ACTIVITY:  You should plan to take it easy for the rest of today and you should NOT DRIVE or use heavy machinery until tomorrow (because of the sedation medicines used during the test).    FOLLOW UP: Our staff will call the number listed on your records the next business day following your procedure to check on you and address any questions or concerns that you may have regarding the information given to you following your procedure. If we do not reach you, we will leave a message.  However, if you are feeling well and you are not experiencing any problems, there is no need to return our call.  We will assume that you have returned to your regular daily activities without incident.  If any biopsies were taken you will be contacted by phone or by letter within the next 1-3 weeks.  Please call us at [phone] if you have not heard about the biopsies in 3 weeks.    SIGNATURES/CONFIDENTIALITY: You and/or your care partner have signed paperwork which  will be entered into your electronic medical record.  These signatures attest to the fact that that the information above on your After Visit Summary has been reviewed and is understood.  Full responsibility of the confidentiality of this discharge information lies with you and/or your care-partner.

## 2018-04-10 NOTE — Op Note (Signed)
[place] Patient Name: Kristen [name] Procedure Date: 04/10/2018 9:29 AM MRN: [id number] Endoscopist: Gatha Mayer , MD Age: 53 Referring MD:  Date of Birth: Nov 28, 1965 Gender: Female Account #: [id number] Procedure:                Upper GI endoscopy Indications:              Dysphagia Medicines:                Propofol per Anesthesia, Monitored Anesthesia Care Procedure:                Pre-Anesthesia Assessment:                           - Prior to the procedure, a History and Physical                            was performed, and patient medications and                            allergies were reviewed. The patient's tolerance of                            previous anesthesia was also reviewed. The risks                            and benefits of the procedure and the sedation                            options and risks were discussed with the patient.                            All questions were answered, and informed consent                            was obtained. Prior Anticoagulants: The patient has                            taken no previous anticoagulant or antiplatelet                            agents. ASA Grade Assessment: II - A patient with                            mild systemic disease. After reviewing the risks                            and benefits, the patient was deemed in                            satisfactory condition to undergo the procedure.                           After obtaining informed consent, the endoscope was  passed under direct vision. Throughout the                            procedure, the patient's blood pressure, pulse, and                            oxygen saturations were monitored continuously. The                            Endoscope was introduced through the mouth, and                            advanced to the second part of duodenum. The upper                            GI endoscopy was  accomplished without difficulty.                            The patient tolerated the procedure well. Scope In: Scope Out: Findings:                 Diffuse, yellow plaques were found in the upper                            third of the esophagus and in the middle third of                            the esophagus.                           There was evidence of a gastrostomy present on the                            anterior wall of the gastric body.                           Localized mildly erythematous mucosa without                            bleeding was found on the anterior wall of the                            stomach.                           The exam was otherwise without abnormality.                           The cardia and gastric fundus were normal on                            retroflexion. Complications:            No immediate complications. Estimated Blood Loss:     Estimated blood loss: none. Impression:               -  Esophageal plaques were found, consistent with                            candidiasis.                           - Gastrostomy present.                           - Erythematous mucosa in the anterior wall of the                            stomach.                           - The examination was otherwise normal.                           - No specimens collected. Recommendation:           - See the other procedure note for documentation of                            additional recommendations.                           - Diflucan (fluconazole) 100 mg PO daily 21 days                            take 200 mg day 1. Gatha Mayer, MD 04/10/2018 10:18:10 AM This report has been signed electronically.

## 2018-04-10 NOTE — Progress Notes (Signed)
Called to room to assist during endoscopic procedure.  Patient ID and intended procedure confirmed with present staff. Received instructions for my participation in the procedure from the performing physician.  

## 2018-04-10 NOTE — Progress Notes (Signed)
To PACU, VSS. Report to [email or web address] 

## 2018-04-10 NOTE — Progress Notes (Signed)
Pt's states no medical or surgical changes since previsit or office visit. 

## 2018-04-11 ENCOUNTER — Telehealth: Payer: Self-pay

## 2018-04-11 NOTE — Telephone Encounter (Signed)
   Follow up Call-  Call back number 04/10/2018  Post procedure Call Back phone  # [phone]  Permission to leave phone message Yes  Some recent data might be hidden     Patient questions:  Do you have a fever, pain , or abdominal swelling? No. Pain Score  0 *  Have you tolerated food without any problems? Yes.    Have you been able to return to your normal activities? Yes.    Do you have any questions about your discharge instructions: Diet   No. Medications  No. Follow up visit  No.  Do you have questions or concerns about your Care? No.  Actions: * If pain score is 4 or above: No action needed, pain <4.

## 2018-04-12 ENCOUNTER — Encounter (INDEPENDENT_AMBULATORY_CARE_PROVIDER_SITE_OTHER): Payer: Self-pay | Admitting: Specialist

## 2018-04-14 ENCOUNTER — Encounter: Payer: Self-pay | Admitting: Family Medicine

## 2018-04-14 ENCOUNTER — Other Ambulatory Visit (INDEPENDENT_AMBULATORY_CARE_PROVIDER_SITE_OTHER): Payer: Self-pay | Admitting: Specialist

## 2018-04-14 ENCOUNTER — Other Ambulatory Visit: Payer: Self-pay | Admitting: Internal Medicine

## 2018-04-14 MED ORDER — DIPHENOXYLATE-ATROPINE 2.5-0.025 MG PO TABS: 2.0000 | ORAL_TABLET | Freq: Two times a day (BID) | ORAL | 3 refills | Status: DC

## 2018-04-14 NOTE — Progress Notes (Signed)
Colon biopsies show acute inflammation Given chronicity of sxs I suspect this is prep effect  Lomotil works  Will continue that  Refilled by me  Colon recall 10 years 2030  No letter

## 2018-04-16 ENCOUNTER — Encounter: Payer: Self-pay | Admitting: Family Medicine

## 2018-04-16 NOTE — Telephone Encounter (Signed)
Rx request 

## 2018-04-17 ENCOUNTER — Encounter: Payer: Self-pay | Admitting: Family Medicine

## 2018-04-17 ENCOUNTER — Other Ambulatory Visit: Payer: Self-pay

## 2018-04-17 ENCOUNTER — Ambulatory Visit (INDEPENDENT_AMBULATORY_CARE_PROVIDER_SITE_OTHER): Payer: BC Managed Care – PPO | Admitting: Family Medicine

## 2018-04-17 NOTE — Progress Notes (Signed)
Pt did not connect for web ex appointment, nor did she answer phone calls from office.

## 2018-04-18 ENCOUNTER — Encounter: Payer: Self-pay | Admitting: Family Medicine

## 2018-04-18 ENCOUNTER — Other Ambulatory Visit: Payer: Self-pay

## 2018-04-18 ENCOUNTER — Ambulatory Visit (INDEPENDENT_AMBULATORY_CARE_PROVIDER_SITE_OTHER): Payer: BC Managed Care – PPO | Admitting: Family Medicine

## 2018-04-18 ENCOUNTER — Telehealth: Payer: BC Managed Care – PPO | Admitting: Physician Assistant

## 2018-04-18 DIAGNOSIS — J301 Allergic rhinitis due to pollen: Secondary | ICD-10-CM

## 2018-04-18 MED ORDER — LEVOCETIRIZINE DIHYDROCHLORIDE 5 MG PO TABS: 5.0000 mg | ORAL_TABLET | Freq: Every evening | ORAL | 0 refills | Status: DC

## 2018-04-18 MED ORDER — AMOXICILLIN 500 MG PO CAPS: 500.0000 mg | ORAL_CAPSULE | Freq: Two times a day (BID) | ORAL | 0 refills | Status: AC

## 2018-04-18 NOTE — Progress Notes (Signed)
Several more unsuccessful attempts to reach pt on phone for possible sinusitis/facial edema.  Allergies reviewed.  Amoxicillin sent to pharmacy.

## 2018-04-18 NOTE — Addendum Note (Signed)
Addended by: Grier Mitts R on: 04/18/2018 03:08 PM   Modules accepted: Orders

## 2018-04-18 NOTE — Progress Notes (Signed)
Unable to reach pt via webex or phone for appointment.

## 2018-04-18 NOTE — Progress Notes (Signed)
E visit for Allergic Rhinitis We are sorry that you are not feeling well.  Here is how we plan to help!  Based on what you have shared with me it looks like you have Allergic Rhinitis.  Rhinitis is when a reaction occurs that causes nasal congestion, runny nose, sneezing, and itching.  Most types of rhinitis are caused by an inflammation and are associated with symptoms in the eyes ears or throat. There are several types of rhinitis.  The most common are acute rhinitis, which is usually caused by a viral illness, allergic or seasonal rhinitis, and nonallergic or year-round rhinitis.  Nasal allergies occur certain times of the year.  Allergic rhinitis is caused when allergens in the air trigger the release of histamine in the body.  Histamine causes itching, swelling, and fluid to build up in the fragile linings of the nasal passages, sinuses and eyelids.  An itchy nose and clear discharge are common.  I recommend the following over the counter treatments, which I will prescribe for you.  Xyzal 5 mg take 1 tablet daily   HOME CARE:  You can use an over-the-counter saline nasal spray as needed Avoid areas where there is heavy dust, mites, or molds Stay indoors on windy days during the pollen season Keep windows closed in home, at least in bedroom; use air conditioner. Use high-efficiency house air filter Keep windows closed in car, turn AC on re-circulate Avoid playing out with dog during pollen season  GET HELP RIGHT AWAY IF:  If your symptoms do not improve within 10 days You become short of breath You develop yellow or green discharge from your nose for over 3 days You have coughing fits  MAKE SURE YOU:  Understand these instructions Will watch your condition Will get help right away if you are not doing well or get worse  Thank you for choosing an e-visit. Your e-visit answers were reviewed by a board certified advanced clinical practitioner to complete your personal care plan.  Depending upon the condition, your plan could have included both over the counter or prescription medications. Please review your pharmacy choice. Be sure that the pharmacy you have chosen is open so that you can pick up your prescription now.  If there is a problem you may message your provider in [place] to have the prescription routed to another pharmacy. Your safety is important to [place]. If you have drug allergies check your prescription carefully.  For the next 24 hours, you can use MyChart to ask questions about today's visit, request a non-urgent call back, or ask for a work or school excuse from your e-visit provider. You will get an email in the next two days asking about your experience. I hope that your e-visit has been valuable and will speed your recovery.         ===View-only below this line===   ----- Message -----    From: Edwyna Ready    Sent: 04/18/2018  3:10 PM EDT      To: E-Visit Mailing List Subject: E-Visit Submission: Sinus Problems  E-Visit Submission: Sinus Problems --------------------------------  Question: Which of the following have you been experiencing? Answer:   Pain around the nose and face  Question: Have these symptoms significantly worsened over the last two to three days? Answer:   No  Question: Have you had any of the following? Answer:   None of the above  Question: How long have you been having these symptoms? Answer:   3 days  Question: Do you have a fever? Answer:   No, I do not have a fever  Question: Do you smoke? Answer:   No  Question: Have you ever smoked? Answer:   I have never smoked  Question: Do you have any chronic illnesses, such as diabetes, heart disease, kidney disease, or lung disease, or any illness that would weaken your body's ability to fight infection? Answer:   No  Question: When you blow your nose, what color is the mucus? Answer:   Mostly clear  Question: Have you experienced similar problems in  the past? Answer:   No  Question: Have you recently been hospitalized? Answer:   No  Question: What medications are you currently taking for these symptoms? Answer:   Nothing  Question: Please list your medication allergies that you may have ? (If 'none' , please list as 'none') Answer:   [place]  Question: Please list any additional comments  Answer:     A total of 5-10 minutes was spent evaluating this patients questionnaire and formulating a plan of care.

## 2018-05-03 ENCOUNTER — Telehealth: Payer: Self-pay | Admitting: Physician Assistant

## 2018-05-03 NOTE — Telephone Encounter (Signed)
Patient called IR with concerns regarding pain at g-tube insertion site.   Per chart patient with ongoing issues with pain/granulation tissue at insertion site previously treated successfully with silver nitrate. Her g-tube was most recently exchanged on 03/29/18 for an 18 Fr balloon retention g-tube. Patient only uses g-tube for medications.  Returned call to patient today at 10:13 -- she tells me that the pain is the same as it was previously when she had the exchange, it is mild in nature and does not affect her daily life besides being an annoyance. She does not have any other symptoms. She does note some granulation tissue forming which is where he pain is located. She has not had any trouble using the g-tube and it is in the same position as usual. She has been keeping the insertion site clean and dry. There is no leakage of gastric contents, pus, bleeding or necrosis. She calls today due to concern that she may need to have the tube changed earlier because last time this happened that's what was done.   Discussed granulation tissue as likely cause of pain and that this is not uncommon nor is it harmful. We also discussed that this is unlikely to indicate that she needs her g-tube replaced which she states was her biggest concern. In an attempt to prevent an unnecessary trip to the hospital given current corona virus outbreak we discussed home pain management with OTC pain relievers as well as salt water soaks +/- hydrocortisone cream (for no longer than 7 days) at the insertion site. Ms. Loyd states she will try these treatments and continue to monitor the site. She understands to call if her pain worsens, she develops discharge or bleeding from the site or her tube is not functioning. She states understanding to the above.   Candiss Norse, PA-C

## 2018-05-05 ENCOUNTER — Other Ambulatory Visit (INDEPENDENT_AMBULATORY_CARE_PROVIDER_SITE_OTHER): Payer: Self-pay | Admitting: Specialist

## 2018-05-08 NOTE — Telephone Encounter (Signed)
Tramadol refill request

## 2018-05-10 ENCOUNTER — Other Ambulatory Visit: Payer: Self-pay

## 2018-05-10 MED ORDER — DIPHENOXYLATE-ATROPINE 2.5-0.025 MG PO TABS: 2.0000 | ORAL_TABLET | Freq: Two times a day (BID) | ORAL | 3 refills | Status: DC

## 2018-05-14 ENCOUNTER — Other Ambulatory Visit: Payer: Self-pay | Admitting: Physician Assistant

## 2018-05-14 ENCOUNTER — Other Ambulatory Visit (INDEPENDENT_AMBULATORY_CARE_PROVIDER_SITE_OTHER): Payer: Self-pay | Admitting: Specialist

## 2018-05-15 NOTE — Telephone Encounter (Signed)
meloxicam refill request

## 2018-05-16 ENCOUNTER — Encounter (INDEPENDENT_AMBULATORY_CARE_PROVIDER_SITE_OTHER): Payer: Self-pay | Admitting: Specialist

## 2018-05-20 ENCOUNTER — Other Ambulatory Visit (INDEPENDENT_AMBULATORY_CARE_PROVIDER_SITE_OTHER): Payer: Self-pay | Admitting: Orthopedic Surgery

## 2018-05-22 NOTE — Telephone Encounter (Signed)
Ok to rf? 

## 2018-05-22 NOTE — Telephone Encounter (Signed)
y

## 2018-05-23 ENCOUNTER — Encounter: Payer: Self-pay | Admitting: Specialist

## 2018-05-29 ENCOUNTER — Telehealth: Payer: Self-pay | Admitting: Specialist

## 2018-05-29 NOTE — Telephone Encounter (Signed)
Patient called checking if we had sent her records to [facility] as she filed for [employer]. I told he records was received 4/2 for dates 2018-present and I mailed 79 pages to them. Another reques was received 4/21 for dates 04/2018 and that I faxed back to tham advising that there was no records within the requested dates as she was last seen 03/2018. She voiced understanding and stated she would contact Social Security.

## 2018-06-05 ENCOUNTER — Other Ambulatory Visit: Payer: Self-pay | Admitting: Family Medicine

## 2018-06-15 ENCOUNTER — Other Ambulatory Visit: Payer: Self-pay | Admitting: Family Medicine

## 2018-06-19 ENCOUNTER — Ambulatory Visit
Admission: RE | Admit: 2018-06-19 | Discharge: 2018-06-19 | Disposition: A | Discharge status: Home / Self Care | Payer: BC Managed Care – PPO | Source: Ambulatory Visit | Attending: Specialist | Admitting: Specialist

## 2018-06-19 ENCOUNTER — Other Ambulatory Visit: Payer: Self-pay

## 2018-06-19 DIAGNOSIS — Z4889 Encounter for other specified surgical aftercare: Secondary | ICD-10-CM

## 2018-06-22 ENCOUNTER — Other Ambulatory Visit (INDEPENDENT_AMBULATORY_CARE_PROVIDER_SITE_OTHER): Payer: Self-pay | Admitting: Orthopedic Surgery

## 2018-06-22 NOTE — Telephone Encounter (Signed)
y

## 2018-06-22 NOTE — Telephone Encounter (Signed)
Ok to rf? 

## 2018-06-28 ENCOUNTER — Other Ambulatory Visit (HOSPITAL_COMMUNITY): Payer: Self-pay | Admitting: Diagnostic Radiology

## 2018-06-28 ENCOUNTER — Ambulatory Visit: Payer: BC Managed Care – PPO | Admitting: Specialist

## 2018-06-28 DIAGNOSIS — R633 Feeding difficulties, unspecified: Secondary | ICD-10-CM

## 2018-06-29 ENCOUNTER — Other Ambulatory Visit: Payer: Self-pay

## 2018-06-29 ENCOUNTER — Ambulatory Visit (HOSPITAL_COMMUNITY)
Admission: RE | Admit: 2018-06-29 | Discharge: 2018-06-29 | Disposition: A | Discharge status: Home / Self Care | Payer: BC Managed Care – PPO | Source: Ambulatory Visit | Attending: Diagnostic Radiology | Admitting: Diagnostic Radiology

## 2018-06-29 ENCOUNTER — Encounter (HOSPITAL_COMMUNITY): Payer: Self-pay | Admitting: Radiology

## 2018-06-29 DIAGNOSIS — R633 Feeding difficulties, unspecified: Secondary | ICD-10-CM

## 2018-06-29 DIAGNOSIS — Z431 Encounter for attention to gastrostomy: Secondary | ICD-10-CM | POA: Diagnosis not present

## 2018-06-29 HISTORY — PX: IR REPLACE G-TUBE SIMPLE WO FLUORO: IMG2323

## 2018-06-29 NOTE — Procedures (Signed)
Patient's existing 37 French balloon retention gastrostomy tube was replaced with new 66 French balloon retention gastrostomy tube without immediate complications.  7 cc normal saline placed into the balloon.  Tube secured to skin site.  EBL none.

## 2018-07-03 ENCOUNTER — Other Ambulatory Visit (INDEPENDENT_AMBULATORY_CARE_PROVIDER_SITE_OTHER): Payer: Self-pay | Admitting: Orthopedic Surgery

## 2018-07-03 NOTE — Telephone Encounter (Signed)
Ok to rf? 

## 2018-07-09 ENCOUNTER — Encounter: Payer: Self-pay | Admitting: Specialist

## 2018-07-10 ENCOUNTER — Other Ambulatory Visit (INDEPENDENT_AMBULATORY_CARE_PROVIDER_SITE_OTHER): Payer: Self-pay | Admitting: Orthopedic Surgery

## 2018-07-10 NOTE — Telephone Encounter (Signed)
Ok to rf? 

## 2018-07-11 NOTE — Telephone Encounter (Signed)
y

## 2018-07-17 ENCOUNTER — Encounter: Payer: Self-pay | Admitting: Family Medicine

## 2018-07-17 ENCOUNTER — Ambulatory Visit (INDEPENDENT_AMBULATORY_CARE_PROVIDER_SITE_OTHER): Payer: BC Managed Care – PPO | Admitting: Family Medicine

## 2018-07-17 ENCOUNTER — Other Ambulatory Visit: Payer: Self-pay

## 2018-07-17 ENCOUNTER — Emergency Department (HOSPITAL_COMMUNITY)
Admission: EM | Admit: 2018-07-17 | Discharge: 2018-07-18 | Disposition: A | Discharge status: Home / Self Care | Payer: BC Managed Care – PPO | Attending: Emergency Medicine | Admitting: Emergency Medicine

## 2018-07-17 ENCOUNTER — Emergency Department (HOSPITAL_COMMUNITY): Payer: BC Managed Care – PPO

## 2018-07-17 ENCOUNTER — Encounter (HOSPITAL_COMMUNITY): Payer: Self-pay

## 2018-07-17 DIAGNOSIS — M545 Low back pain, unspecified: Secondary | ICD-10-CM

## 2018-07-17 DIAGNOSIS — Z79899 Other long term (current) drug therapy: Secondary | ICD-10-CM | POA: Diagnosis not present

## 2018-07-17 DIAGNOSIS — Z8589 Personal history of malignant neoplasm of other organs and systems: Secondary | ICD-10-CM | POA: Diagnosis not present

## 2018-07-17 DIAGNOSIS — Z7289 Other problems related to lifestyle: Secondary | ICD-10-CM

## 2018-07-17 DIAGNOSIS — R3915 Urgency of urination: Secondary | ICD-10-CM

## 2018-07-17 DIAGNOSIS — N3000 Acute cystitis without hematuria: Secondary | ICD-10-CM

## 2018-07-17 DIAGNOSIS — R509 Fever, unspecified: Secondary | ICD-10-CM | POA: Insufficient documentation

## 2018-07-17 DIAGNOSIS — Z789 Other specified health status: Secondary | ICD-10-CM

## 2018-07-17 DIAGNOSIS — F1090 Alcohol use, unspecified, uncomplicated: Secondary | ICD-10-CM

## 2018-07-17 DIAGNOSIS — R52 Pain, unspecified: Secondary | ICD-10-CM

## 2018-07-17 DIAGNOSIS — M7918 Myalgia, other site: Secondary | ICD-10-CM | POA: Diagnosis present

## 2018-07-17 LAB — URINALYSIS, ROUTINE W REFLEX MICROSCOPIC
Bilirubin Urine: NEGATIVE
Glucose, UA: NEGATIVE mg/dL
Ketones, ur: 20 mg/dL — AB
Nitrite: NEGATIVE
Protein, ur: NEGATIVE mg/dL
Specific Gravity, Urine: 1.014 (ref 1.005–1.030)
WBC, UA: >50 WBC/hpf — ABNORMAL HIGH (ref 0–5)
pH: 6 (ref 5.0–8.0)

## 2018-07-17 LAB — CBC WITH DIFFERENTIAL/PLATELET
Abs Immature Granulocytes: 0.01 10*3/uL (ref 0.00–0.07)
Basophils Absolute: 0 10*3/uL (ref 0.0–0.1)
Basophils Relative: 1 %
Eosinophils Absolute: 0 10*3/uL (ref 0.0–0.5)
Eosinophils Relative: 1 %
HCT: 34.1 % — ABNORMAL LOW (ref 36.0–46.0)
Hemoglobin: 10.3 g/dL — ABNORMAL LOW (ref 12.0–15.0)
Immature Granulocytes: 1 %
Lymphocytes Relative: 29 %
Lymphs Abs: 0.6 10*3/uL — ABNORMAL LOW (ref 0.7–4.0)
MCH: 25.9 pg — ABNORMAL LOW (ref 26.0–34.0)
MCHC: 30.2 g/dL (ref 30.0–36.0)
MCV: 85.7 fL (ref 80.0–100.0)
Monocytes Absolute: 0.5 10*3/uL (ref 0.1–1.0)
Monocytes Relative: 25 %
Neutro Abs: 0.9 10*3/uL — ABNORMAL LOW (ref 1.7–7.7)
Neutrophils Relative %: 43 %
Platelets: 127 10*3/uL — ABNORMAL LOW (ref 150–400)
RBC: 3.98 MIL/uL (ref 3.87–5.11)
RDW: 14.6 % (ref 11.5–15.5)
WBC: 2.1 10*3/uL — ABNORMAL LOW (ref 4.0–10.5)
nRBC: 0 % (ref 0.0–0.2)

## 2018-07-17 LAB — BASIC METABOLIC PANEL
Anion gap: 7 (ref 5–15)
BUN: 7 mg/dL (ref 6–20)
CO2: 24 mmol/L (ref 22–32)
Calcium: 9.1 mg/dL (ref 8.9–10.3)
Chloride: 108 mmol/L (ref 98–111)
Creatinine, Ser: 0.81 mg/dL (ref 0.44–1.00)
GFR calc Af Amer: >60 mL/min (ref >60)
GFR calc non Af Amer: >60 mL/min (ref >60)
Glucose, Bld: 95 mg/dL (ref 70–99)
Potassium: 3.4 mmol/L — ABNORMAL LOW (ref 3.5–5.1)
Sodium: 139 mmol/L (ref 135–145)

## 2018-07-17 MED ORDER — ACETAMINOPHEN 325 MG PO TABS
650.0000 mg | ORAL_TABLET | Freq: Once | ORAL | Status: DC | PRN
  Filled 2018-07-17: qty 2

## 2018-07-17 MED ORDER — SODIUM CHLORIDE 0.9 % IV SOLN
1.0000 g | Freq: Once | INTRAVENOUS | Status: AC
  Administered 2018-07-17: 23:00:00 1 g via INTRAVENOUS
  Filled 2018-07-17: qty 10

## 2018-07-17 MED ORDER — SODIUM CHLORIDE 0.9 % IV BOLUS
1000.0000 mL | Freq: Once | INTRAVENOUS | Status: AC
  Administered 2018-07-17: 1000 mL via INTRAVENOUS

## 2018-07-17 NOTE — ED Triage Notes (Addendum)
Pt reports pain started in her back today and then increased to pain all over. 7/10 pain. Pt endorses cough, fever. Temp 102.1 at triage. Pt denies SOB.

## 2018-07-17 NOTE — Progress Notes (Signed)
Virtual Visit via Video Note  I connected with Kristen [name] on 07/17/18 at  3:30 PM EDT by a video enabled telemedicine application and verified that I am speaking with the correct person using two identifiers.  Location patient: home Location provider:work or home office Persons participating in the virtual visit: patient, provider  I discussed the limitations of evaluation and management by telemedicine and the availability of in person appointments. The patient expressed understanding and agreed to proceed.   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  Pt speaks with a slur at baseline 2/2 surgery on tongue.  Pt with low back pain, urinary urgency, HA with throbbing behind her eyes, body aches, chills, rhinorrhea, and dry cough x a few days.  Pt also with edema in feet and legs x 2 wks.  Pt has not taken her meds today.  States she is hurting from the "top of her head to the soles of her feet" and all she wants to do is sleep.  Denies SOB, hematuria, sore throat, n/v. movement of pain in her back, sick contacts.  Pt concerned having a UTI, often has back pain and somnolence with them.  Pt "drinking a lot of beer lately", 5-6 beers per day.     Pt mentions R hip pain for which surgery is planned.  Followed by Ortho, Dr. Louanne Skye.  ROS: See pertinent positives and negatives per HPI.  Past Medical History:  Diagnosis Date  . Allergy   . Anemia    Iron deficiency  . Anxiety   . Arthritis    back, left shoulder  . Blood transfusion without reported diagnosis   . Cancer ([place])   . Chicken pox   . Depression   . Diarrhea    takes Imodium daily  . GERD (gastroesophageal reflux disease)   . H/O hiatal hernia   . Headache(784.0)   . History of kidney stones    1996ish  . History of radiation therapy 11/16/16- 01/01/17   Base of Tongue/ 66 gy in 33  fractions/ Dose: 2 Gy  . Hypertension   . Migraine    none for 5 years (as of 01/13/16)  . OSA (obstructive sleep apnea) 10/13/2015   unable to get cpap, plans to get one in 2018  . Pneumonia   . Restless legs   . Scoliosis   . Shortness of breath    with exertion  . Sleep apnea     Past Surgical History:  Procedure Laterality Date  . BACK SURGERY  07/21/1978  . CHOLECYSTECTOMY N/A 08/15/2013   Procedure: LAPAROSCOPIC CHOLECYSTECTOMY WITH INTRAOPERATIVE CHOLANGIOGRAM;  Surgeon: Gwenyth Ober, MD;  Location: [facility];  Service: General;  Laterality: N/A;  . COLONOSCOPY N/A 01/09/2013   Procedure: COLONOSCOPY;  Surgeon: Beryle Beams, MD;  Location: [facility];  Service: Endoscopy;  Laterality: N/A;  . DIRECT LARYNGOSCOPY  07/2016   Dr. Nicolette Bang [facility]  . GASTROSTOMY TUBE PLACEMENT  09/27/2016  . HERNIA REPAIR Left 1981  . IR PATIENT EVAL TECH 0-60 MINS  12/13/2016  . IR PATIENT EVAL TECH 0-60 MINS  04/11/2017  . IR PATIENT EVAL TECH 0-60 MINS  03/24/2018  . IR REPLACE G-TUBE SIMPLE WO FLUORO  12/12/2017  . IR REPLACE G-TUBE SIMPLE WO FLUORO  03/29/2018  . IR REPLACE G-TUBE SIMPLE WO FLUORO  06/29/2018  . MODIFIED RADICAL  NECK DISSECTION Left 09/27/2016   Levels 1 & 2  . PARTIAL GLOSSECTOMY Left 09/27/2016   Left hemi partial glossectomy  . SPINE SURGERY  01/20/2016   fusion  . TONSILLECTOMY    . tracheotomy  09/27/2016  . TUBAL LIGATION  06/1988    Family History  Problem Relation Age of Onset  . Heart disease Father   . Heart attack Father   . Hypertension Mother   . Arthritis Mother   . Diabetes Maternal Grandmother   . Diabetes Paternal Grandmother   . Diabetes Maternal Uncle   . Colon polyps Neg Hx   . Colon cancer Neg Hx   . Esophageal cancer Neg Hx   . Stomach cancer Neg Hx   . Rectal cancer Neg Hx      Current Outpatient Medications:  .  amLODipine (NORVASC) 10 MG tablet, TAKE 1 TABLET BY MOUTH DAILY, Disp: 90 tablet, Rfl: 3 .  amphetamine-dextroamphetamine  (ADDERALL) 10 MG tablet, 1-3 daily as needed for sleepiness, Disp: 90 tablet, Rfl: 0 .  aspirin 81 MG chewable tablet, Place 81 mg into feeding tube daily. , Disp: , Rfl:  .  baclofen (LIORESAL) 10 MG tablet, PLACE 1 TABLET INTO FEEDING TUBE THREE TIMES DAILY, Disp: 30 tablet, Rfl: 0 .  calcium carbonate (OSCAL) 1500 (600 Ca) MG TABS tablet, 600 mg of elemental calcium 2 (two) times daily with a meal. Per tube, Disp: , Rfl:  .  chlorhexidine (PERIDEX) 0.12 % solution, USE UTD 15 MLS IN THE MOUTH OR THROAT BID, Disp: , Rfl:  .  diclofenac (VOLTAREN) 75 MG EC tablet, TAKE 1 TABLET BY MOUTH TWICE DAILY WITH FOOD AS NEEDED FOR PAIN, Disp: 180 tablet, Rfl: 3 .  diphenoxylate-atropine (LOMOTIL) 2.5-0.025 MG tablet, Take 2 tablets by mouth 2 (two) times daily. Or via tube, Disp: 120 tablet, Rfl: 3 .  ergocalciferol (DRISDOL) 8000 UNIT/ML drops, Place 6 mLs (48,000 Units total) into feeding tube once a week., Disp: 60 mL, Rfl: 1 .  gabapentin (NEURONTIN) 300 MG capsule, Take 1 capsule (300 mg total) by mouth 3 (three) times daily., Disp: 270 capsule, Rfl: 0 .  hydrocortisone cream 1 %, APPLY EXTERNALLY TO THE AFFECTED AREA TWICE DAILY, Disp: 30 g, Rfl: 0 .  levocetirizine (XYZAL) 5 MG tablet, Take 1 tablet (5 mg total) by mouth every evening., Disp: 30 tablet, Rfl: 0 .  lidocaine (XYLOCAINE) 2 % solution, RINSE AND GARGLE 5 ML PO OR THROAT Q 3 H FOR 100 DOSES UTD, Disp: , Rfl:  .  LORazepam (ATIVAN) 0.5 MG tablet, Take 1 tab po at the MRI scanner for anxiety. (Patient not taking: Reported on 03/28/2018), Disp: 1 tablet, Rfl: 0 .  magic mouthwash w/lidocaine SOLN, Use 2 teaspoons swish and spit every 4-6 hours prn, Disp: 150 mL, Rfl: 0 .  meloxicam (MOBIC) 15 MG tablet, TAKE 1 TABLET(15 MG) BY MOUTH DAILY, Disp: 30 tablet, Rfl: 2 .  pantoprazole (PROTONIX) 40 MG tablet, TAKE 1 TABLET BY MOUTH ONCE DAILY, Disp: 90 tablet, Rfl: 3 .  Potassium 99 MG TABS, 1 tablet (99 mg total) by Gastrostomy Tube route daily.  (Patient taking differently: Place 1 tablet into feeding tube daily. ), Disp: 330 tablet, Rfl: 1 .  Prasterone (INTRAROSA) 6.5 MG INST, [place] 6.5 mg vaginal insert  Insert 1 vaginal insert every day by vaginal route at bedtime for 28 days.  START PRESCRIBED MEDICATION AND USE FOR ANOTHER MONTH AFTER FINISHING THE FREE SAMPLES, Disp: , Rfl:  .  SUMAtriptan (IMITREX) 100 MG tablet, Take 0.5-1 tablet by mouth at the onset of migraine and may repeat in 2 hours if headache persists or recurs. (Patient not taking: Reported on 03/28/2018), Disp: 10 tablet, Rfl: 0  EXAM:  VITALS per patient if applicable: Temp 60.6 F  GENERAL: sitting on couch, alert, oriented, appears well but uncomfortable, in no acute distress  HEENT: atraumatic, conjunctiva clear, no obvious abnormalities on inspection of external nose and ears  NECK: normal movements of the head and neck  LUNGS: on inspection no signs of respiratory distress, breathing rate appears normal, no obvious gross SOB, gasping or wheezing  CV: no obvious cyanosis  MS: moves all visible extremities without noticeable abnormality  PSYCH/NEURO: pleasant and cooperative, no obvious depression or anxiety, speech and thought processing grossly intact  ASSESSMENT AND PLAN:  Discussed the following assessment and plan:  Acute bilateral low back pain without sciatica  -concern for UTI, renal calculi, dehydration, DDD. -given pts numerous symptoms and inability to come to the clinic prior to close discussed further eval at [facility] or ED.  Urinary urgency  -concern for UTI -advise further eval with UA, possible UCx  Alcohol use  -concern or dehydation -drinking 5-6 beers per day -discussed need to cut down. -will re-evaluate at next visit  Body aches  -concern for urosepsis, flu, COVID-19, other viral eitology  F/u prn   I discussed the assessment and treatment plan with the patient. The patient was provided an opportunity to ask questions and  all were answered. The patient agreed with the plan and demonstrated an understanding of the instructions.   The patient was advised to call back or seek an in-person evaluation if the symptoms worsen or if the condition fails to improve as anticipated.   [name], MD

## 2018-07-17 NOTE — ED Provider Notes (Signed)
[facility] EMERGENCY DEPARTMENT Provider Note   CSN: [id number] Arrival date & time: 07/17/18  1615     History   Chief Complaint Chief Complaint  Patient presents with  . Generalized Body Aches    HPI Kristen [name] is a 53 y.o. female.     Patient with history of head and neck cancer, current G-tube for medications, history of UTI --presents to the emergency department with complaint of back pain, generalized body aches, fever.  Patient states that she started having some pain in her lower back yesterday.  She has had a previous surgery in her lower back and thought the pain was due to the weather.  Later in the day she developed generalized body aches.  Upon arrival to the emergency department today she had a fever to 102.1 F for the first time.  She has had a mild cough.  The onset of this condition was acute. The course is constant. Aggravating factors: none. Alleviating factors: none.       Past Medical History:  Diagnosis Date  . Allergy   . Anemia    Iron deficiency  . Anxiety   . Arthritis    back, left shoulder  . Blood transfusion without reported diagnosis   . Cancer ([place])   . Chicken pox   . Depression   . Diarrhea    takes Imodium daily  . GERD (gastroesophageal reflux disease)   . H/O hiatal hernia   . Headache(784.0)   . History of kidney stones    1996ish  . History of radiation therapy 11/16/16- 01/01/17   Base of Tongue/ 66 gy in 33 fractions/ Dose: 2 Gy  . Hypertension   . Migraine    none for 5 years (as of 01/13/16)  . OSA (obstructive sleep apnea) 10/13/2015   unable to get cpap, plans to get one in 2018  . Pneumonia   . Restless legs   . Scoliosis   . Shortness of breath    with exertion  . Sleep apnea     Patient Active Problem List   Diagnosis Date Noted  . Acute encephalopathy 05/15/2017  . UTI (urinary tract infection) 05/15/2017  . Hypotension 02/10/2017  . 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 Malignant neoplasm of base of tongue ([place]) 10/29/2016  . 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[place]) 10/29/2016  . Headache associated with sexual activity 05/14/2016  . Tongue lesion 05/14/2016  . Loose stools 03/12/2016  . Hypocalcemia 01/22/2016  . Thrombocytopenia ([place]) 01/22/2016  . Chronic diarrhea 01/22/2016  . Chest pain   . Essential hypertension   . Orthostatic hypotension   . Spinal stenosis of lumbar region 01/20/2016    Class: Chronic  . DDD (degenerative disc disease), lumbar 01/20/2016    Class: Chronic  . Spinal stenosis of lumbar region with neurogenic claudication 01/20/2016  . Low back pain 12/22/2015  . Pre-op examination 12/22/2015  . Hypersomnolence 10/13/2015  . Muscle cramp 08/01/2015  . GERD (gastroesophageal reflux disease) 07/09/2015  . Excessive daytime sleepiness 06/18/2015  . Shingles 08/28/2013  . Postop check 08/28/2013  . Cholelithiasis 07/10/2013  . Colitis 01/08/2013  . Postoperative anemia 01/08/2013  . Morbid obesity with BMI of 40.0-44.9, adult ([place]) 01/08/2013    Past Surgical History:  Procedure Laterality Date  . BACK SURGERY  07/21/1978  . CHOLECYSTECTOMY N/A 08/15/2013   Procedure: LAPAROSCOPIC CHOLECYSTECTOMY WITH INTRAOPERATIVE CHOLANGIOGRAM;  Surgeon: Gwenyth Ober, MD;  Location: [facility]  OR;  Service: General;  Laterality: N/A;  . COLONOSCOPY N/A 01/09/2013   Procedure: COLONOSCOPY;  Surgeon: Beryle Beams, MD;  Location: [facility];  Service: Endoscopy;  Laterality: N/A;  . DIRECT LARYNGOSCOPY  07/2016   Dr. Nicolette Bang [facility]  . GASTROSTOMY TUBE PLACEMENT  09/27/2016  . HERNIA REPAIR Left 1981  . IR PATIENT EVAL TECH 0-60 MINS  12/13/2016  . IR PATIENT EVAL TECH 0-60 MINS  04/11/2017  . IR PATIENT EVAL TECH 0-60 MINS  03/24/2018  . IR REPLACE G-TUBE SIMPLE WO FLUORO  12/12/2017  . IR REPLACE G-TUBE SIMPLE WO FLUORO  03/29/2018  . IR REPLACE G-TUBE SIMPLE WO FLUORO  06/29/2018  . MODIFIED RADICAL NECK DISSECTION  Left 09/27/2016   Levels 1 & 2  . PARTIAL GLOSSECTOMY Left 09/27/2016   Left hemi partial glossectomy  . SPINE SURGERY  01/20/2016   fusion  . TONSILLECTOMY    . tracheotomy  09/27/2016  . TUBAL LIGATION  06/1988     OB History   No obstetric history on file.      Home Medications    Prior to Admission medications   Medication Sig Start Date End Date Taking? Authorizing Provider  amLODipine (NORVASC) 10 MG tablet TAKE 1 TABLET BY MOUTH DAILY 10/13/17   [name], MD  amphetamine-dextroamphetamine (ADDERALL) 10 MG tablet 1-3 daily as needed for sleepiness 03/15/18   Baird Lyons D, MD  aspirin 81 MG chewable tablet Place 81 mg into feeding tube daily.  10/05/16   [provider]  baclofen (LIORESAL) 10 MG tablet PLACE 1 TABLET INTO FEEDING TUBE THREE TIMES DAILY 07/11/18   Meredith Pel, MD  calcium carbonate (OSCAL) 1500 (600 Ca) MG TABS tablet 600 mg of elemental calcium 2 (two) times daily with a meal. Per tube    [provider]  chlorhexidine (PERIDEX) 0.12 % solution USE UTD 15 MLS IN THE MOUTH OR THROAT BID 01/16/18   [provider]  diclofenac (VOLTAREN) 75 MG EC tablet TAKE 1 TABLET BY MOUTH TWICE DAILY WITH FOOD AS NEEDED FOR PAIN 09/15/17   Jessy Oto, MD  diphenoxylate-atropine (LOMOTIL) 2.5-0.025 MG tablet Take 2 tablets by mouth 2 (two) times daily. Or via tube 05/10/18   Gatha Mayer, MD  ergocalciferol (DRISDOL) 8000 UNIT/ML drops Place 6 mLs (48,000 Units total) into feeding tube once a week. 03/04/17   [name], MD  gabapentin (NEURONTIN) 300 MG capsule Take 1 capsule (300 mg total) by mouth 3 (three) times daily. 06/05/18   [name], MD  hydrocortisone cream 1 % APPLY EXTERNALLY TO THE AFFECTED AREA TWICE DAILY 06/16/18   [name], MD  levocetirizine (XYZAL) 5 MG tablet Take 1 tablet (5 mg total) by mouth every evening. 04/18/18   Tereasa Coop, PA-C  lidocaine (XYLOCAINE) 2 % solution RINSE AND  GARGLE 5 ML PO OR THROAT Q 3 H FOR 100 DOSES UTD 01/09/18   [provider]  LORazepam (ATIVAN) 0.5 MG tablet Take 1 tab po at the MRI scanner for anxiety. Patient not taking: Reported on 03/28/2018 01/04/18   Jessy Oto, MD  magic mouthwash w/lidocaine SOLN Use 2 teaspoons swish and spit every 4-6 hours prn 01/24/18   [name], MD  meloxicam (MOBIC) 15 MG tablet TAKE 1 TABLET(15 MG) BY MOUTH DAILY 05/15/18   Jessy Oto, MD  pantoprazole (PROTONIX) 40 MG tablet TAKE 1 TABLET BY MOUTH ONCE DAILY 10/13/17   Grier Mitts  R, MD  Potassium 99 MG TABS 1 tablet (99 mg total) by Gastrostomy Tube route daily. Patient taking differently: Place 1 tablet into feeding tube daily.  05/26/17   [name], MD  Prasterone (INTRAROSA) 6.5 MG INST [place] 6.5 mg vaginal insert  Insert 1 vaginal insert every day by vaginal route at bedtime for 28 days.  START PRESCRIBED MEDICATION AND USE FOR ANOTHER MONTH AFTER FINISHING THE FREE SAMPLES    [provider]  SUMAtriptan (IMITREX) 100 MG tablet Take 0.5-1 tablet by mouth at the onset of migraine and may repeat in 2 hours if headache persists or recurs. Patient not taking: Reported on 03/28/2018 05/14/16   [place] Circle, FNP    Family History Family History  Problem Relation Age of Onset  . Heart disease Father   . Heart attack Father   . Hypertension Mother   . Arthritis Mother   . Diabetes Maternal Grandmother   . Diabetes Paternal Grandmother   . Diabetes Maternal Uncle   . Colon polyps Neg Hx   . Colon cancer Neg Hx   . Esophageal cancer Neg Hx   . Stomach cancer Neg Hx   . Rectal cancer Neg Hx     Social History Social History   Tobacco Use  . Smoking status: Never Smoker  . Smokeless tobacco: Never Used  Substance Use Topics  . Alcohol use: Yes    Alcohol/week: 7.0 standard drinks    Types: 7 Cans of beer per week  . Drug use: No     Allergies   Other and Sulfur   Review of Systems Review of  Systems  Constitutional: Negative for fever.  HENT: Negative for rhinorrhea and sore throat.   Eyes: Negative for redness.  Respiratory: Negative for cough.   Cardiovascular: Negative for chest pain.  Gastrointestinal: Negative for abdominal pain, diarrhea, nausea and vomiting.  Genitourinary: Positive for frequency and urgency. Negative for dysuria and hematuria.  Musculoskeletal: Positive for back pain and myalgias.  Skin: Negative for rash.  Neurological: Negative for headaches.     Physical Exam Updated Vital Signs BP (!) 133/103   Pulse 94   Temp 99.1 F (37.3 C) (Oral)   Resp 17   LMP 11/15/2013   SpO2 100%   Physical Exam Vitals signs and nursing note reviewed.  Constitutional:      Appearance: She is well-developed.  HENT:     Head: Normocephalic and atraumatic.  Eyes:     General:        Right eye: No discharge.        Left eye: No discharge.     Conjunctiva/sclera: Conjunctivae normal.  Neck:     Musculoskeletal: Normal range of motion and neck supple.  Cardiovascular:     Rate and Rhythm: Normal rate and regular rhythm.     Heart sounds: Normal heart sounds.  Pulmonary:     Effort: Pulmonary effort is normal.     Breath sounds: Normal breath sounds.  Abdominal:     Palpations: Abdomen is soft.     Tenderness: There is no abdominal tenderness.  Musculoskeletal:     Cervical back: She exhibits normal range of motion, no tenderness and no bony tenderness.     Thoracic back: She exhibits normal range of motion, no tenderness and no bony tenderness.     Lumbar back: She exhibits tenderness. She exhibits normal range of motion and no bony tenderness.  Skin:    General: Skin is warm and  dry.  Neurological:     Mental Status: She is alert.      ED Treatments / Results  Labs (all labs ordered are listed, but only abnormal results are displayed) Labs Reviewed  URINALYSIS, ROUTINE W REFLEX MICROSCOPIC - Abnormal; Notable for the following components:       Result Value   Hgb urine dipstick SMALL (*)    Ketones, ur 20 (*)    Leukocytes,Ua LARGE (*)    WBC, UA >50 (*)    Bacteria, UA RARE (*)    All other components within normal limits  CBC WITH DIFFERENTIAL/PLATELET - Abnormal; Notable for the following components:   WBC 2.1 (*)    Hemoglobin 10.3 (*)    HCT 34.1 (*)    MCH 25.9 (*)    Platelets 127 (*)    Neutro Abs 0.9 (*)    Lymphs Abs 0.6 (*)    All other components within normal limits  BASIC METABOLIC PANEL    EKG None  Radiology Dg Chest Portable 1 View  Result Date: 07/17/2018 CLINICAL DATA:  Fever, cough EXAM: PORTABLE CHEST 1 VIEW COMPARISON:  03/24/2018 FINDINGS: Heart and mediastinal contours are within normal limits. No focal opacities or effusions. No acute bony abnormality. IMPRESSION: No active disease. Electronically Signed   By: Rolm Baptise M.D.   On: 07/17/2018 22:29    Procedures Procedures (including critical care time)  Medications Ordered in ED Medications  acetaminophen (TYLENOL) tablet 650 mg (650 mg Oral Refused 07/17/18 1636)  cefTRIAXone (ROCEPHIN) 1 g in sodium chloride 0.9 % 100 mL IVPB (1 g Intravenous New Bag/Given 07/17/18 2256)  sodium chloride 0.9 % bolus 1,000 mL (1,000 mLs Intravenous New Bag/Given 07/17/18 2243)     Initial Impression / Assessment and Plan / ED Course  I have reviewed the triage vital signs and the nursing notes.  Pertinent labs & imaging results that were available during my care of the patient were reviewed by me and considered in my medical decision making (see chart for details).        Patient seen and examined. Work-up initiated.   Vital signs reviewed and are as follows: BP (!) 133/103   Pulse 94   Temp 99.1 F (37.3 C) (Oral)   Resp 17   LMP 11/15/2013   SpO2 100%   Chest x-ray is clear.  UA is consistent with infection.  Patient had urine culture 1 year ago that grew pansensitive E. coli.  Previous 2 urine cultures without growth.  Patient will be  given dose of Rocephin given her fever upon arrival to the emergency department.  Anticipate discharge to home with oral antibiotics (Keflex) after completion of treatment here.  Patient urged to return with worsening symptoms or other concerns. Patient verbalized understanding and agrees with plan.    Final Clinical Impressions(s) / ED Diagnoses   Final diagnoses:  Acute cystitis without hematuria  Febrile illness   Patient with back pain consistent with previous UTI, fever today.  Overall patient appears well.  Lab work shows chronic mild neutropenia.  Patient has received IV Rocephin here in emergency department.  She is comfortable with discharged home with close PCP follow-up or return to the emergency department if she gets worse.   ED Discharge Orders         Ordered    cephALEXin (KEFLEX) 500 MG capsule  2 times daily     07/18/18 0001           Carlisle Cater,  PA-C 07/18/18 0034    Hayden Rasmussen, MD 07/18/18 [phone]

## 2018-07-17 NOTE — ED Notes (Signed)
Pt unsure if she has a UTI. Pt gave [place] urine specimen

## 2018-07-18 ENCOUNTER — Other Ambulatory Visit (INDEPENDENT_AMBULATORY_CARE_PROVIDER_SITE_OTHER): Payer: Self-pay | Admitting: Orthopedic Surgery

## 2018-07-18 MED ORDER — CEPHALEXIN 500 MG PO CAPS: 500.0000 mg | ORAL_CAPSULE | Freq: Two times a day (BID) | ORAL | 0 refills | Status: DC

## 2018-07-18 NOTE — Telephone Encounter (Signed)
Looks like Dr. Marlou Sa refilled June 23.  Should have lasted 10 days.  Needs appointment with Dr. Louanne Skye to review CT lumbar before additional.

## 2018-07-18 NOTE — Telephone Encounter (Signed)
Pt completed visit on 07/17/2018.  Nothing further needed.

## 2018-07-18 NOTE — Discharge Instructions (Signed)
Please read and follow all provided instructions.  Your diagnoses today include:  1. Acute cystitis without hematuria   2. Febrile illness     Tests performed today include:  Urine test - suggests that you have an infection in your bladder  Blood counts and electrolytes - chronic low white blood cell count  Chest x-ray - no pneumonia  Vital signs. See below for your results today.   Medications prescribed:   Keflex (cephalexin) - antibiotic  You have been prescribed an antibiotic medicine: take the entire course of medicine even if you are feeling better. Stopping early can cause the antibiotic not to work.  Home care instructions:  Follow any educational materials contained in this packet.  Follow-up instructions: Please follow-up with your primary care provider in 3 days if symptoms are not resolved for further evaluation of your symptoms.  Return instructions:   Please return to the Emergency Department if you experience worsening symptoms.   Return with fever, worsening pain, persistent vomiting, worsening pain in your back.   Please return if you have any other emergent concerns.  Additional Information:  Your vital signs today were: BP (!) 138/94    Pulse 82    Temp 99.1 F (37.3 C) (Oral)    Resp 18    LMP 11/15/2013    SpO2 100%  If your blood pressure (BP) was elevated above 135/85 this visit, please have this repeated by your doctor within one month. --------------

## 2018-07-18 NOTE — Telephone Encounter (Signed)
Kristen [name] patient

## 2018-07-18 NOTE — Telephone Encounter (Signed)
Baclofen refill request

## 2018-07-28 IMAGING — CR DG LUMBAR SPINE COMPLETE 4+V
5 series · 5 of 5 positions shown · non-contrast
Comparison: 10/11/2016

CLINICAL DATA: Right buttock pain, no known injury, initial
encounter

EXAM:
LUMBAR SPINE - COMPLETE 4+ VIEW

[t lumbar spine lat]
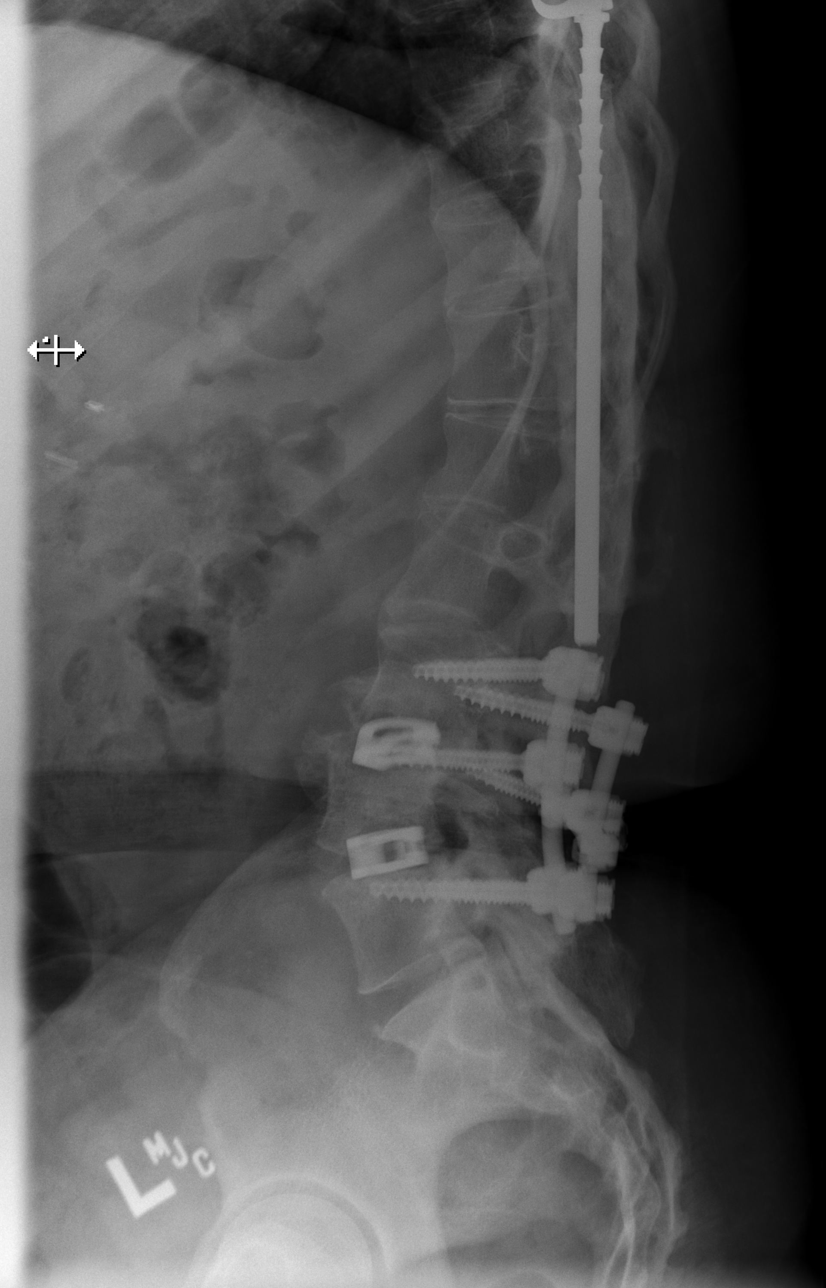

[t lumbar l-5 s-1 spot]
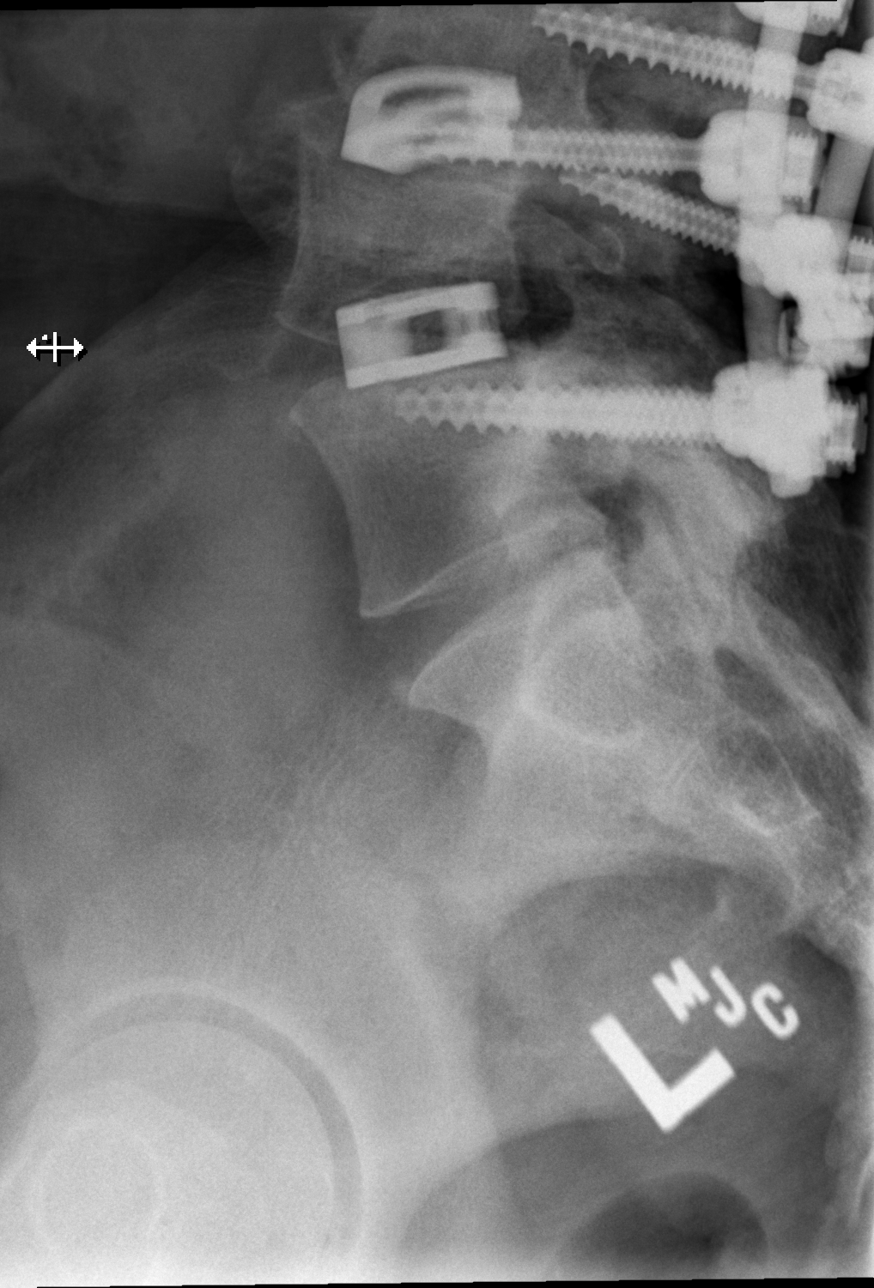

[t lumbar spine obl (1 of 3)]
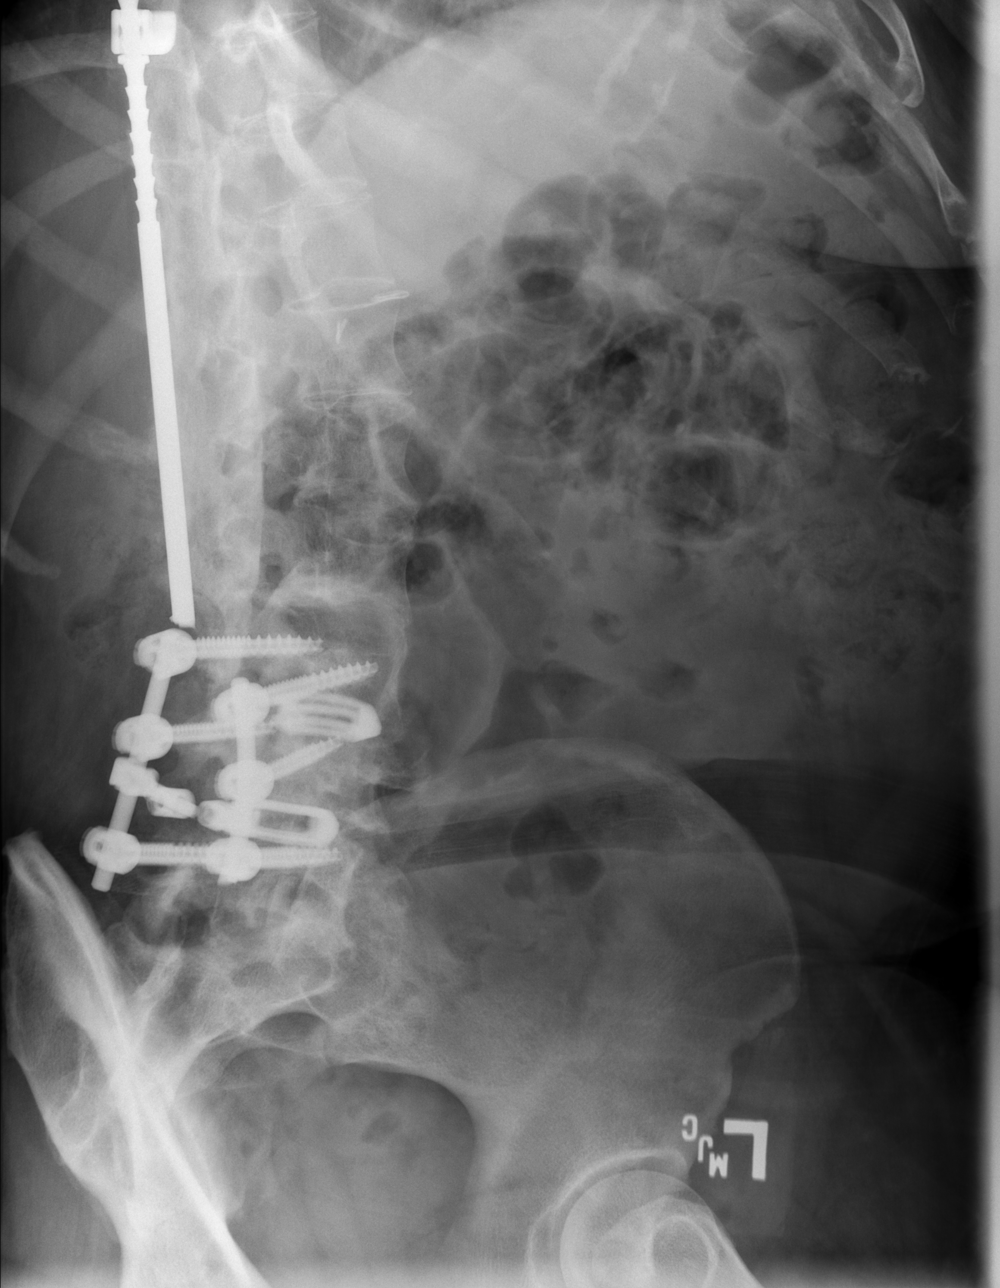

[t lumbar spine obl (2 of 3)]
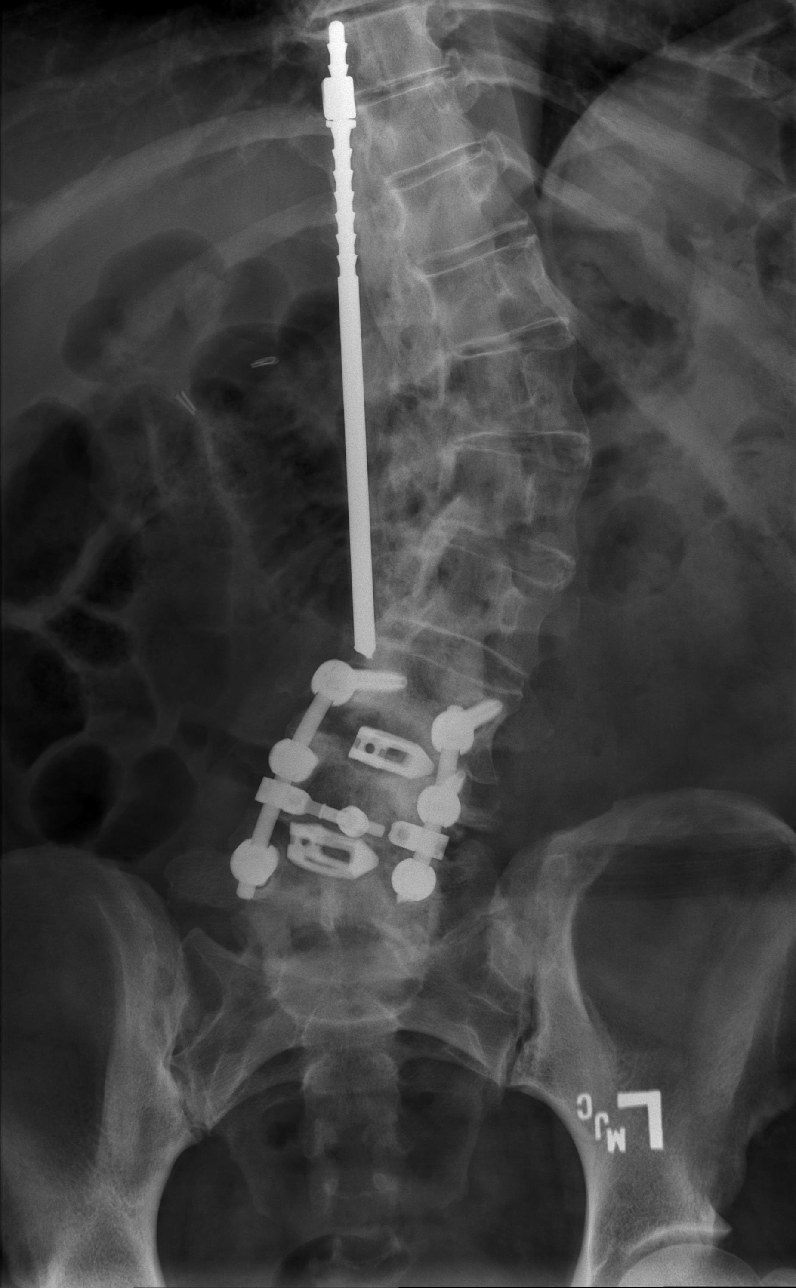

[t lumbar spine obl (3 of 3)]
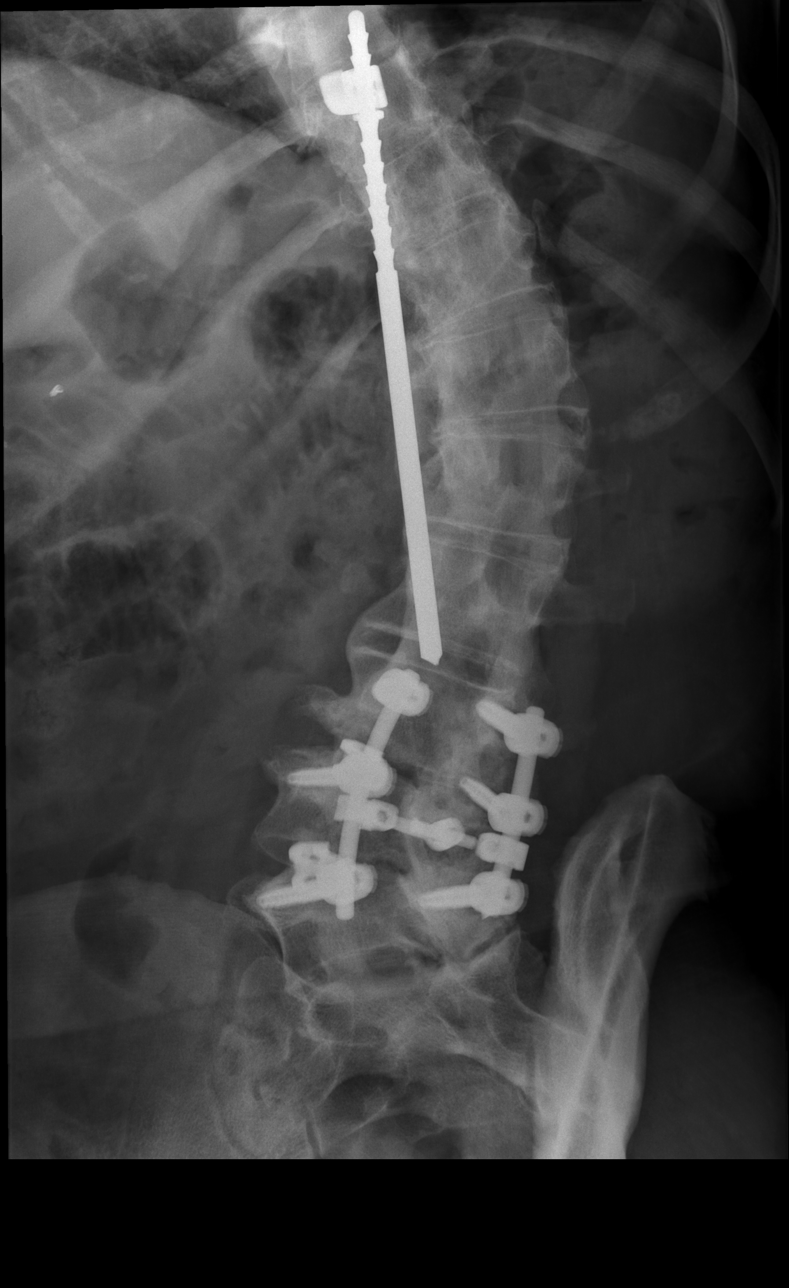

[5 of 5 positions shown; findings below may reference images not displayed]

FINDINGS: Changes of prior fusion are noted at L3-4 and L4-5 with posterior
fixation. A fixation rod is noted extending from the thoracic spine
inferiorly this which is stable in appearance. No distal fixation is
noted. Scoliosis concave to the right is seen. No acute hardware
failure is noted.
IMPRESSION: Chronic changes and postsurgical changes without acute abnormality.

## 2018-07-31 ENCOUNTER — Other Ambulatory Visit (INDEPENDENT_AMBULATORY_CARE_PROVIDER_SITE_OTHER): Payer: Self-pay | Admitting: Specialist

## 2018-07-31 ENCOUNTER — Other Ambulatory Visit: Payer: Self-pay | Admitting: Family Medicine

## 2018-07-31 DIAGNOSIS — I10 Essential (primary) hypertension: Secondary | ICD-10-CM

## 2018-07-31 DIAGNOSIS — K219 Gastro-esophageal reflux disease without esophagitis: Secondary | ICD-10-CM

## 2018-07-31 DIAGNOSIS — K449 Diaphragmatic hernia without obstruction or gangrene: Secondary | ICD-10-CM

## 2018-07-31 NOTE — Telephone Encounter (Signed)
Should not take diclofenac and mobic at the same time. Mobic the  Less potent NSAID is discontinued. jen

## 2018-08-01 ENCOUNTER — Other Ambulatory Visit: Payer: Self-pay

## 2018-08-01 ENCOUNTER — Encounter: Payer: Self-pay | Admitting: Specialist

## 2018-08-01 DIAGNOSIS — K449 Diaphragmatic hernia without obstruction or gangrene: Secondary | ICD-10-CM

## 2018-08-01 DIAGNOSIS — I10 Essential (primary) hypertension: Secondary | ICD-10-CM

## 2018-08-01 DIAGNOSIS — K219 Gastro-esophageal reflux disease without esophagitis: Secondary | ICD-10-CM

## 2018-08-02 ENCOUNTER — Other Ambulatory Visit: Payer: Self-pay | Admitting: Radiology

## 2018-08-02 MED ORDER — MELOXICAM 15 MG PO TABS: ORAL_TABLET | ORAL | 2 refills | Status: DC

## 2018-08-03 ENCOUNTER — Other Ambulatory Visit: Payer: Self-pay

## 2018-08-03 ENCOUNTER — Ambulatory Visit (INDEPENDENT_AMBULATORY_CARE_PROVIDER_SITE_OTHER): Payer: BC Managed Care – PPO | Admitting: Specialist

## 2018-08-03 ENCOUNTER — Encounter: Payer: Self-pay | Admitting: Specialist

## 2018-08-03 VITALS — BP 104/69 | HR 67 | Ht 69.0 in | Wt 190.0 lb

## 2018-08-03 DIAGNOSIS — M4807 Spinal stenosis, lumbosacral region: Secondary | ICD-10-CM

## 2018-08-03 DIAGNOSIS — M4325 Fusion of spine, thoracolumbar region: Secondary | ICD-10-CM

## 2018-08-03 DIAGNOSIS — T84498A Other mechanical complication of other internal orthopedic devices, implants and grafts, initial encounter: Secondary | ICD-10-CM

## 2018-08-03 DIAGNOSIS — M5136 Other intervertebral disc degeneration, lumbar region: Secondary | ICD-10-CM

## 2018-08-03 DIAGNOSIS — Z4889 Encounter for other specified surgical aftercare: Secondary | ICD-10-CM

## 2018-08-03 DIAGNOSIS — M960 Pseudarthrosis after fusion or arthrodesis: Secondary | ICD-10-CM

## 2018-08-03 DIAGNOSIS — M4316 Spondylolisthesis, lumbar region: Secondary | ICD-10-CM

## 2018-08-03 NOTE — Progress Notes (Signed)
Office Visit Note   Patient: Kristen [name]           Date of Birth: 1965-11-30           MRN: [id number] Visit Date: 08/03/2018              Requested by: [name], MD [place],  [place] [id number] PCP: [name], MD   Assessment & Plan: Visit Diagnoses:  1. Fusion of spine of thoracolumbar region   2. Other intervertebral disc degeneration, lumbar region   3. Encounter for other specified surgical aftercare   4. Spinal stenosis of lumbosacral region   5. Spondylolisthesis of lumbar region   6. Loosening of hardware in spine ([place])   7. Pseudarthrosis after fusion or arthrodesis     Plan:  Avoid bending, stooping and avoid lifting weights greater than 10 lbs. Avoid prolong standing and walking. Order for a new walker with wheels. Surgery scheduling secretary Kandice Hams, will call you in the next week to schedule for surgery.  Surgery recommended is a possible two level lumbar fusion, explore the fusion at L3-4 and perform a right sided interbody fusion if there is still motion at this level, revise the pedicle screws at L3 bilateral and L5-S1 this would be done with rods, pedicle screws, iliac wing screws and cages with local bone graft and allograft (donor bone graft). Take hydrocodone for for pain. Risk of surgery includes risk of infection 1 in 200 patients, bleeding 1/2% chance you would need a transfusion.   Risk to the nerves is one in 10,000. You will need to use a brace for 3 months and wean from the brace on the 4th month. Expect improved walking and standing tolerance. Expect relief of leg pain but numbness may persist depending on the length and degree of pressure that has been present. Follow-Up Instructions: No follow-ups on file.   Orders:  No orders of the defined types were placed in this encounter.  No orders of the defined types were placed in this encounter.     Procedures: No procedures performed   Clinical  Data: No additional findings.   Subjective: Chief Complaint  Patient presents with  . Lower Back - Follow-up    CT scan review    53 year old female with history of Thoracolumbar fusion T10 to L3 underwent extension of fusion to L4-5 and L3-4 with pedicle screws and rods and cages 01/2015. She has had persistent pain and pain with standing and walking and sitting and driving. Back pain mid lower lumbar and mid lumbar. Back pain is worse than leg pain all of the pain is in the left leg. Recent CTscan was done.   Review of Systems  Constitutional: Negative.   HENT: Positive for sore throat and trouble swallowing.   Eyes: Negative.  Negative for photophobia, pain, discharge, redness and itching.  Respiratory: Negative for apnea, cough, choking, chest tightness, shortness of breath, wheezing and stridor.   Cardiovascular: Negative.  Negative for chest pain, palpitations and leg swelling.  Gastrointestinal: Negative.  Negative for abdominal distention, abdominal pain, anal bleeding, blood in stool, constipation, diarrhea, nausea, rectal pain and vomiting.  Endocrine: Negative.  Negative for cold intolerance, heat intolerance, polydipsia, polyphagia and polyuria.  Genitourinary: Negative.  Negative for difficulty urinating, dyspareunia, dysuria, enuresis, flank pain, frequency, genital sores, hematuria, pelvic pain and urgency.  Musculoskeletal: Positive for back pain and gait problem. Negative for arthralgias, joint swelling, myalgias, neck pain and  neck stiffness.  Skin: Negative.  Negative for color change, pallor, rash and wound.  Allergic/Immunologic: Negative.  Negative for environmental allergies, food allergies and immunocompromised state.  Neurological: Positive for weakness and numbness. Negative for dizziness, tremors, seizures, syncope, facial asymmetry, speech difficulty, light-headedness and headaches.  Hematological: Negative.  Negative for adenopathy. Does not bruise/bleed easily.   Psychiatric/Behavioral: Negative.  Negative for agitation, behavioral problems, confusion, decreased concentration, dysphoric mood, hallucinations, self-injury, sleep disturbance and suicidal ideas. The patient is not nervous/anxious and is not hyperactive.      Objective: Vital Signs: BP 104/69 (BP Location: Left Arm, Patient Position: Sitting)   Pulse 67   Ht 5' 9"  (1.753 m)   Wt 190 lb (86.2 kg)   LMP 11/15/2013   BMI 28.06 kg/m   Physical Exam  Back Exam   Range of Motion  Extension: abnormal  Flexion: abnormal  Lateral bend right: abnormal  Lateral bend left: abnormal  Rotation right: abnormal  Rotation left: abnormal   Muscle Strength  Right Quadriceps:  5/5  Left Quadriceps:  4/5  Right Hamstrings:  5/5  Left Hamstrings:  5/5   Tests  Straight leg raise right: negative Straight leg raise left: negative  Reflexes  Patellar: 0/4 Achilles: 0/4 Babinski's sign: normal   Other  Toe walk: normal Heel walk: abnormal Sensation: decreased Gait: abnormal  Erythema: no back redness Scars: present  Comments:  Left quadriceps weak 4/5 and left foot DF weak 5-/5, left EHL weak 4/5      Specialty Comments:  No specialty comments available.  Imaging: No results found.   PMFS History: Patient Active Problem List   Diagnosis Date Noted  . Spinal stenosis of lumbar region 01/20/2016    Priority: High    Class: Chronic  . DDD (degenerative disc disease), lumbar 01/20/2016    Priority: High    Class: Chronic  . Acute encephalopathy 05/15/2017  . UTI (urinary tract infection) 05/15/2017  . Hypotension 02/10/2017  . 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 Malignant neoplasm of base of tongue ([place]) 10/29/2016  . 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[place]) 10/29/2016  . Headache associated with sexual activity 05/14/2016  . Tongue lesion 05/14/2016  . Loose stools 03/12/2016  . Hypocalcemia 01/22/2016  . Thrombocytopenia ([place])  01/22/2016  . Chronic diarrhea 01/22/2016  . Chest pain   . Essential hypertension   . Orthostatic hypotension   . Spinal stenosis of lumbar region with neurogenic claudication 01/20/2016  . Low back pain 12/22/2015  . Pre-op examination 12/22/2015  . Hypersomnolence 10/13/2015  . Muscle cramp 08/01/2015  . GERD (gastroesophageal reflux disease) 07/09/2015  . Excessive daytime sleepiness 06/18/2015  . Shingles 08/28/2013  . Postop check 08/28/2013  . Cholelithiasis 07/10/2013  . Colitis 01/08/2013  . Postoperative anemia 01/08/2013  . Morbid obesity with BMI of 40.0-44.9, adult ([place]) 01/08/2013   Past Medical History:  Diagnosis Date  . Allergy   . Anemia    Iron deficiency  . Anxiety   . Arthritis    back, left shoulder  . Blood transfusion without reported diagnosis   . Cancer ([place])   . Chicken pox   . Depression   . Diarrhea    takes Imodium daily  . GERD (gastroesophageal reflux disease)   . H/O hiatal hernia   . Headache(784.0)   . History of kidney stones    1996ish  . History of radiation therapy 11/16/16- 01/01/17   Base of Tongue/ 66 gy in  33 fractions/ Dose: 2 Gy  . Hypertension   . Migraine    none for 5 years (as of 01/13/16)  . OSA (obstructive sleep apnea) 10/13/2015   unable to get cpap, plans to get one in 2018  . Pneumonia   . Restless legs   . Scoliosis   . Shortness of breath    with exertion  . Sleep apnea     Family History  Problem Relation Age of Onset  . Heart disease Father   . Heart attack Father   . Hypertension Mother   . Arthritis Mother   . Diabetes Maternal Grandmother   . Diabetes Paternal Grandmother   . Diabetes Maternal Uncle   . Colon polyps Neg Hx   . Colon cancer Neg Hx   . Esophageal cancer Neg Hx   . Stomach cancer Neg Hx   . Rectal cancer Neg Hx     Past Surgical History:  Procedure Laterality Date  . BACK SURGERY  07/21/1978  . CHOLECYSTECTOMY N/A 08/15/2013   Procedure: LAPAROSCOPIC CHOLECYSTECTOMY WITH  INTRAOPERATIVE CHOLANGIOGRAM;  Surgeon: Gwenyth Ober, MD;  Location: [facility];  Service: General;  Laterality: N/A;  . COLONOSCOPY N/A 01/09/2013   Procedure: COLONOSCOPY;  Surgeon: Beryle Beams, MD;  Location: [facility];  Service: Endoscopy;  Laterality: N/A;  . DIRECT LARYNGOSCOPY  07/2016   Dr. Nicolette Bang [facility]  . GASTROSTOMY TUBE PLACEMENT  09/27/2016  . HERNIA REPAIR Left 1981  . IR PATIENT EVAL TECH 0-60 MINS  12/13/2016  . IR PATIENT EVAL TECH 0-60 MINS  04/11/2017  . IR PATIENT EVAL TECH 0-60 MINS  03/24/2018  . IR REPLACE G-TUBE SIMPLE WO FLUORO  12/12/2017  . IR REPLACE G-TUBE SIMPLE WO FLUORO  03/29/2018  . IR REPLACE G-TUBE SIMPLE WO FLUORO  06/29/2018  . MODIFIED RADICAL NECK DISSECTION Left 09/27/2016   Levels 1 & 2  . PARTIAL GLOSSECTOMY Left 09/27/2016   Left hemi partial glossectomy  . SPINE SURGERY  01/20/2016   fusion  . TONSILLECTOMY    . tracheotomy  09/27/2016  . TUBAL LIGATION  06/1988   Social History   Occupational History  . Occupation: Air cabin crew  Tobacco Use  . Smoking status: Never Smoker  . Smokeless tobacco: Never Used  Substance and Sexual Activity  . Alcohol use: Yes    Alcohol/week: 7.0 standard drinks    Types: 7 Cans of beer per week  . Drug use: No  . Sexual activity: Yes    Birth control/protection: Surgical

## 2018-08-03 NOTE — Patient Instructions (Signed)
Avoid bending, stooping and avoid lifting weights greater than 10 lbs. Avoid prolong standing and walking. Order for a new walker with wheels. Surgery scheduling secretary Kandice Hams, will call you in the next week to schedule for surgery.  Surgery recommended is a possible two level lumbar fusion, explore the fusion at L3-4 and perform a right sided interbody fusion if there is still motion at this level, revise the pedicle screws at L3 bilateral and L5-S1 this would be done with rods, pedicle screws, iliac wing screws and cages with local bone graft and allograft (donor bone graft). Take hydrocodone for for pain. Risk of surgery includes risk of infection 1 in 200 patients, bleeding 1/2% chance you would need a transfusion.   Risk to the nerves is one in 10,000. You will need to use a brace for 3 months and wean from the brace on the 4th month. Expect improved walking and standing tolerance. Expect relief of leg pain but numbness may persist depending on the length and degree of pressure that has been present.

## 2018-08-21 ENCOUNTER — Encounter: Payer: Self-pay | Admitting: *Deleted

## 2018-08-21 NOTE — Progress Notes (Signed)
Oncology Nurse Navigator Documentation  Flowsheet/spreadsheet update.  Gayleen Orem, RN, BSN Head & Neck Oncology Nurse [facility] [phone]

## 2018-08-23 ENCOUNTER — Ambulatory Visit: Payer: BC Managed Care – PPO | Admitting: Specialist

## 2018-08-28 ENCOUNTER — Other Ambulatory Visit (HOSPITAL_COMMUNITY): Payer: Self-pay | Admitting: Otolaryngology

## 2018-08-28 ENCOUNTER — Other Ambulatory Visit: Payer: Self-pay | Admitting: Otolaryngology

## 2018-08-28 ENCOUNTER — Other Ambulatory Visit (INDEPENDENT_AMBULATORY_CARE_PROVIDER_SITE_OTHER): Payer: Self-pay | Admitting: Specialist

## 2018-08-28 DIAGNOSIS — C109 Malignant neoplasm of oropharynx, unspecified: Secondary | ICD-10-CM

## 2018-08-29 NOTE — Telephone Encounter (Signed)
Tramadol refill request

## 2018-08-30 ENCOUNTER — Encounter: Payer: Self-pay | Admitting: Family Medicine

## 2018-08-31 ENCOUNTER — Ambulatory Visit
Admission: RE | Admit: 2018-08-31 | Discharge: 2018-08-31 | Disposition: A | Discharge status: Home / Self Care | Payer: BC Managed Care – PPO | Source: Ambulatory Visit | Attending: Specialist | Admitting: Specialist

## 2018-08-31 DIAGNOSIS — M5136 Other intervertebral disc degeneration, lumbar region: Secondary | ICD-10-CM

## 2018-08-31 DIAGNOSIS — M4316 Spondylolisthesis, lumbar region: Secondary | ICD-10-CM

## 2018-08-31 DIAGNOSIS — Z4889 Encounter for other specified surgical aftercare: Secondary | ICD-10-CM

## 2018-08-31 DIAGNOSIS — M4807 Spinal stenosis, lumbosacral region: Secondary | ICD-10-CM

## 2018-08-31 MED ORDER — GADOBENATE DIMEGLUMINE 529 MG/ML IV SOLN
18.0000 mL | Freq: Once | INTRAVENOUS | Status: AC | PRN
  Administered 2018-08-31: 11:00:00 18 mL via INTRAVENOUS

## 2018-09-01 ENCOUNTER — Other Ambulatory Visit: Payer: Self-pay

## 2018-09-01 ENCOUNTER — Other Ambulatory Visit (INDEPENDENT_AMBULATORY_CARE_PROVIDER_SITE_OTHER): Payer: Self-pay | Admitting: Family Medicine

## 2018-09-01 ENCOUNTER — Ambulatory Visit (HOSPITAL_COMMUNITY)
Admission: RE | Admit: 2018-09-01 | Discharge: 2018-09-01 | Disposition: A | Discharge status: Home / Self Care | Payer: BC Managed Care – PPO | Source: Ambulatory Visit | Attending: Otolaryngology | Admitting: Otolaryngology

## 2018-09-01 DIAGNOSIS — C109 Malignant neoplasm of oropharynx, unspecified: Secondary | ICD-10-CM

## 2018-09-01 MED ORDER — SODIUM CHLORIDE (PF) 0.9 % IJ SOLN
INTRAMUSCULAR | Status: AC
  Filled 2018-09-01: qty 50

## 2018-09-01 MED ORDER — IOHEXOL 300 MG/ML  SOLN
75.0000 mL | Freq: Once | INTRAMUSCULAR | Status: AC | PRN
  Administered 2018-09-01: 75 mL via INTRAVENOUS

## 2018-09-01 NOTE — Telephone Encounter (Signed)
Dr. Volanda Napoleon please advise. I see this was not prescribed by you

## 2018-09-02 ENCOUNTER — Other Ambulatory Visit: Payer: BC Managed Care – PPO

## 2018-09-04 NOTE — Telephone Encounter (Signed)
Pt typically gets baclofen through Ortho.

## 2018-09-06 ENCOUNTER — Ambulatory Visit (INDEPENDENT_AMBULATORY_CARE_PROVIDER_SITE_OTHER): Payer: BC Managed Care – PPO | Admitting: Specialist

## 2018-09-06 ENCOUNTER — Other Ambulatory Visit: Payer: Self-pay

## 2018-09-06 ENCOUNTER — Encounter: Payer: Self-pay | Admitting: Specialist

## 2018-09-06 VITALS — BP 138/93 | HR 65 | Ht 69.0 in | Wt 190.0 lb

## 2018-09-06 DIAGNOSIS — M960 Pseudarthrosis after fusion or arthrodesis: Secondary | ICD-10-CM

## 2018-09-06 DIAGNOSIS — M4325 Fusion of spine, thoracolumbar region: Secondary | ICD-10-CM

## 2018-09-06 DIAGNOSIS — M41125 Adolescent idiopathic scoliosis, thoracolumbar region: Secondary | ICD-10-CM

## 2018-09-06 DIAGNOSIS — M4807 Spinal stenosis, lumbosacral region: Secondary | ICD-10-CM | POA: Diagnosis not present

## 2018-09-06 DIAGNOSIS — M4726 Other spondylosis with radiculopathy, lumbar region: Secondary | ICD-10-CM

## 2018-09-06 DIAGNOSIS — M4316 Spondylolisthesis, lumbar region: Secondary | ICD-10-CM

## 2018-09-06 MED ORDER — BACLOFEN 10 MG PO TABS: ORAL_TABLET | ORAL | 0 refills | Status: DC

## 2018-09-06 MED ORDER — HYDROCODONE-ACETAMINOPHEN 5-325 MG PO TABS: 1.0000 | ORAL_TABLET | Freq: Four times a day (QID) | ORAL | 0 refills | Status: DC | PRN

## 2018-09-06 NOTE — Patient Instructions (Signed)
Avoid bending, stooping and avoid lifting weights greater than 10 lbs. Avoid prolong standing and walking. Order for a new walker with wheels. Surgery scheduling secretary Kandice Hams, will call you in the next week to schedule for surgery.  Surgery recommended is an exploration of the lumbar fusion L3-4 and L4-5 with rearthrodesis with placement of a second cage if necessary at L3-4 and extension of the fusion to L5-S1 with left L5 foramenal decompression and removal of spurs and interbody fusion, this would be done with rods, screws and cages with local bone graft and allograft (donor bone graft). Take hydrocodone for for pain. Risk of surgery includes risk of infection 1 in 200 patients, bleeding 1/2% chance you would need a transfusion.   Risk to the nerves is one in 10,000. You will need to use a brace for 3 months and wean from the brace on the 4th month. Expect improved walking and standing tolerance. Expect relief of leg pain but numbness may persist depending on the length and degree of pressure that has been present.

## 2018-09-06 NOTE — Progress Notes (Signed)
Office Visit Note   Patient: Kristen [name]           Date of Birth: [birth year]           MRN: [id number] Visit Date: 09/06/2018              Requested by: [name], MD [place],  [place] [address] PCP: [name], MD   Assessment & Plan: Visit Diagnoses:  1. Spinal stenosis of lumbosacral region   2. Spondylolisthesis of lumbar region   3. Pseudarthrosis after fusion or arthrodesis   4. Fusion of spine of thoracolumbar region   5. Adolescent idiopathic scoliosis of thoracolumbar region   6. Other spondylosis with radiculopathy, lumbar region     Plan: Avoid bending, stooping and avoid lifting weights greater than 10 lbs. Avoid prolong standing and walking. Order for a new walker with wheels. Surgery scheduling secretary Kandice Hams, will call you in the next week to schedule for surgery.  Surgery recommended is an exploration of the lumbar fusion L3-4 and L4-5 with rearthrodesis with placement of a second cage if necessary at L3-4 and extension of the fusion to L5-S1 with left L5 foramenal decompression and removal of spurs and interbody fusion, this would be done with rods, screws and cages with local bone graft and allograft (donor bone graft). Take hydrocodone for for pain. Risk of surgery includes risk of infection 1 in 200 patients, bleeding 1/2% chance you would need a transfusion.   Risk to the nerves is one in 10,000. You will need to use a brace for 3 months and wean from the brace on the 4th month. Expect improved walking and standing tolerance. Expect relief of leg pain but numbness may persist depending on the length and degree of pressure that has been present.  Follow-Up Instructions: No follow-ups on file.   Orders:  No orders of the defined types were placed in this encounter.  No orders of the defined types were placed in this encounter.     Procedures: No procedures performed   Clinical Data: Findings:   CLINICAL DATA:  Chronic, worsening low back pain radiating down the left leg since 2015. History of scoliosis status post fusion.  EXAM: MRI LUMBAR SPINE WITHOUT AND WITH CONTRAST  TECHNIQUE: Multiplanar and multiecho pulse sequences of the lumbar spine were obtained without and with intravenous contrast.  Creatinine was obtained on site at [facility] at [address]  Results: Creatinine 0.7 mg/dL.  CONTRAST:  80m MULTIHANCE GADOBENATE DIMEGLUMINE 529 MG/ML IV SOLN  COMPARISON:  CT lumbar spine dated June 19, 2018. MRI lumbar spine dated January 08, 2018.  FINDINGS: Segmentation:  Unchanged partial sacralization of L5 on the left.  Alignment:  Unchanged severe thoracolumbar levoscoliosis.  Vertebrae: No fracture, evidence of discitis, or focal bone lesion. Right-sided Harrington rod again noted extending to the L2-L3 level. Prior L3-L5 PLIF. Hardware loosening better evaluated on recent CT.  Conus medullaris and cauda equina: Conus extends to the L1 level. Conus and cauda equina appear normal. No intradural enhancement.  Paraspinal and other soft tissues: Negative.  Disc levels:  T11-T12 to L2-L3: Prior posterior element fusion. Negative disc. No stenosis.  L3-L4: Prior PLIF. Unchanged mild left neuroforaminal stenosis due to facet arthropathy and endplate spurring. No spinal canal stenosis.  L4-L5: Prior PLIF. Unchanged mild left neuroforaminal stenosis due to facet arthropathy and endplate spurring. No spinal canal stenosis.  L5-S1: Unchanged prominent left foraminal endplate osteophytes and moderate  to severe left facet arthropathy resulting in moderate to severe left neuroforaminal stenosis. No spinal canal or right neuroforaminal stenosis.  IMPRESSION: 1. Unchanged moderate to severe left neuroforaminal stenosis at L5-S1 due to foraminal endplate spurring and facet arthropathy. 2. Prior L3-L5 PLIF with unchanged mild left  neuroforaminal stenosis at both levels due to bony hypertrophy. Hardware loosening better evaluated on recent CT.   Electronically Signed   By: Titus Dubin M.D.   On: 08/31/2018 16:32 CLINICAL DATA:  Low back pain. Prior lumbar fusion.  EXAM: CT LUMBAR SPINE WITHOUT CONTRAST  TECHNIQUE: Multidetector CT imaging of the lumbar spine was performed without intravenous contrast administration. Multiplanar CT image reconstructions were also generated.  COMPARISON:  Lumbar Spine MRI 01/08/2018  FINDINGS: Segmentation: The last open disc space is designated L5-S1 for consistency with the prior MRI. L5 is mildly transitional with an enlarged left transverse process with pseudoarticulation with the sacrum. With this numbering, the last pair of ribs is at T11.  Alignment: Severe thoracolumbar levoscoliosis. Chronic grade 1 retrolisthesis of L3 on L4.  Vertebrae: No acute fracture or suspicious osseous lesion is identified. Sequelae of posterior thoracolumbar fusion are partially visualized with a posterior spinal rod remaining on the right and extending to the L2-3 level. There is solid posterior fusion from T10-L3. Pedicle screws and interbody implants are present at L3-4 and L4-5 with a solid left-sided posterolateral osseous fusion mass extending from L3-L5 as well as solid interbody osseous fusion at L4-5. There is subsidence of the L3-4 interbody implant with surrounding lucency and endplate sclerosis with at most a small amount of bridging bone across the L3-4 disc space along the left lateral margin of the implant. There is mild lucency surrounding both L3 pedicle screws. The left L4 screw courses lateral to the pedicle and terminates at the posterior aspect of the disc space.  Paraspinal and other soft tissues: Postoperative changes in the posterior lumbar soft tissues. Status post cholecystectomy.  Disc levels:  L1-2: Previous fusion. No stenosis.   L2-3: Previous fusion. No stenosis.  L3-4: Previous posterior decompression and fusion. Left lateral recess stenosis and medial left neural foraminal stenosis due to left facet hypertrophy and endplate spurring.  L4-5: Previous posterior decompression and fusion. Mild left neural foraminal stenosis due to facet hypertrophy and endplate spurring.  L5-S1: Prominent left foraminal endplate osteophyte formation and moderate to severe left facet arthrosis result in severe left neural foraminal stenosis with potential left L5 nerve root impingement, similar to the prior MRI. No evidence of significant spinal stenosis. Widely patent right neural foramen.  IMPRESSION: 1. Solid posterior fusion at L3-4 and L4-5 and solid interbody fusion at L4-5. 2. Subsidence of the L3-4 interbody implant with at most minimal bridging bone across the L3-4 disc space. Mild lucency surrounding both L3 pedicle screws. 3. Left lateral recess and medial left neural foraminal stenosis at L3-4 due to facet hypertrophy and spurring. 4. Severe left neural foraminal stenosis at L5-S1 due to a large osteophyte with potential L5 nerve root impingement.   Electronically Signed   By: Logan Bores M.D.   On: 06/19/2018 16:11     Subjective: Chief Complaint  Patient presents with  . Lower Back - Follow-up    53 year old female with history of thoracolumbar fusion with previous extension of her fusion to L5 01/2016. She has since had worsening of her back pain and radiation into the posterior right leg. If she lies on the back she will experience a cramping pain  in the left leg. Lying on the left or right side this doesn't happen. Every once in a while the right side hurts but overall she is  Mainly having left leg pain, with associated mild weakness, able to stair walk and stand. She has pain with prolong standing and walking. Moving it does not hurt. No bowel or bladder difficulty. Had a partial tonge  resection for tongue cancer and is eating smaller portions.   Review of Systems  Constitutional: Negative.   HENT: Negative.   Eyes: Negative.   Respiratory: Negative.   Cardiovascular: Negative.   Gastrointestinal: Negative.   Endocrine: Negative.   Genitourinary: Negative.   Musculoskeletal: Negative.   Skin: Negative.   Allergic/Immunologic: Negative.   Neurological: Negative.   Hematological: Negative.   Psychiatric/Behavioral: Negative.      Objective: Vital Signs: BP (!) 138/93   Pulse 65   Ht 5' 9"  (1.753 m)   Wt 190 lb (86.2 kg)   LMP 11/15/2013   BMI 28.06 kg/m   Physical Exam  Back Exam   Tenderness  The patient is experiencing tenderness in the lumbar.  Range of Motion  Extension: abnormal  Flexion: normal  Lateral bend right: normal  Lateral bend left: abnormal  Rotation right: normal  Rotation left: abnormal   Muscle Strength  Right Quadriceps:  5/5  Left Quadriceps:  4/5  Right Hamstrings:  5/5  Left Hamstrings:  5/5   Tests  Straight leg raise right: negative Straight leg raise left: negative  Reflexes  Patellar: 0/4 Achilles: 0/4  Other  Toe walk: abnormal Heel walk: normal Sensation: normal Erythema: no back redness Scars: present      Specialty Comments:  No specialty comments available.  Imaging: No results found.   PMFS History: Patient Active Problem List   Diagnosis Date Noted  . Spinal stenosis of lumbar region 01/20/2016    Priority: High    Class: Chronic  . DDD (degenerative disc disease), lumbar 01/20/2016    Priority: High    Class: Chronic  . Acute encephalopathy 05/15/2017  . UTI (urinary tract infection) 05/15/2017  . Hypotension 02/10/2017  . 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 Malignant neoplasm of base of tongue ([place]) 10/29/2016  . 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[place]) 10/29/2016  . Headache associated with sexual activity 05/14/2016  . Tongue lesion  05/14/2016  . Loose stools 03/12/2016  . Hypocalcemia 01/22/2016  . Thrombocytopenia ([place]) 01/22/2016  . Chronic diarrhea 01/22/2016  . Chest pain   . Essential hypertension   . Orthostatic hypotension   . Spinal stenosis of lumbar region with neurogenic claudication 01/20/2016  . Low back pain 12/22/2015  . Pre-op examination 12/22/2015  . Hypersomnolence 10/13/2015  . Muscle cramp 08/01/2015  . GERD (gastroesophageal reflux disease) 07/09/2015  . Excessive daytime sleepiness 06/18/2015  . Shingles 08/28/2013  . Postop check 08/28/2013  . Cholelithiasis 07/10/2013  . Colitis 01/08/2013  . Postoperative anemia 01/08/2013  . Morbid obesity with BMI of 40.0-44.9, adult ([place]) 01/08/2013   Past Medical History:  Diagnosis Date  . Allergy   . Anemia    Iron deficiency  . Anxiety   . Arthritis    back, left shoulder  . Blood transfusion without reported diagnosis   . Cancer ([place])   . Chicken pox   . Depression   . Diarrhea    takes Imodium daily  . GERD (gastroesophageal reflux disease)   . H/O hiatal hernia   . Headache(784.0)   .  History of kidney stones    1996ish  . History of radiation therapy 11/16/16- 01/01/17   Base of Tongue/ 66 gy in 33 fractions/ Dose: 2 Gy  . Hypertension   . Migraine    none for 5 years (as of 01/13/16)  . OSA (obstructive sleep apnea) 10/13/2015   unable to get cpap, plans to get one in 2018  . Pneumonia   . Restless legs   . Scoliosis   . Shortness of breath    with exertion  . Sleep apnea     Family History  Problem Relation Age of Onset  . Heart disease Father   . Heart attack Father   . Hypertension Mother   . Arthritis Mother   . Diabetes Maternal Grandmother   . Diabetes Paternal Grandmother   . Diabetes Maternal Uncle   . Colon polyps Neg Hx   . Colon cancer Neg Hx   . Esophageal cancer Neg Hx   . Stomach cancer Neg Hx   . Rectal cancer Neg Hx     Past Surgical History:  Procedure Laterality Date  . BACK SURGERY   07/21/1978  . CHOLECYSTECTOMY N/A 08/15/2013   Procedure: LAPAROSCOPIC CHOLECYSTECTOMY WITH INTRAOPERATIVE CHOLANGIOGRAM;  Surgeon: Gwenyth Ober, MD;  Location: [facility];  Service: General;  Laterality: N/A;  . COLONOSCOPY N/A 01/09/2013   Procedure: COLONOSCOPY;  Surgeon: Beryle Beams, MD;  Location: [facility];  Service: Endoscopy;  Laterality: N/A;  . DIRECT LARYNGOSCOPY  07/2016   Dr. Nicolette Bang [facility]  . GASTROSTOMY TUBE PLACEMENT  09/27/2016  . HERNIA REPAIR Left 1981  . IR PATIENT EVAL TECH 0-60 MINS  12/13/2016  . IR PATIENT EVAL TECH 0-60 MINS  04/11/2017  . IR PATIENT EVAL TECH 0-60 MINS  03/24/2018  . IR REPLACE G-TUBE SIMPLE WO FLUORO  12/12/2017  . IR REPLACE G-TUBE SIMPLE WO FLUORO  03/29/2018  . IR REPLACE G-TUBE SIMPLE WO FLUORO  06/29/2018  . MODIFIED RADICAL NECK DISSECTION Left 09/27/2016   Levels 1 & 2  . PARTIAL GLOSSECTOMY Left 09/27/2016   Left hemi partial glossectomy  . SPINE SURGERY  01/20/2016   fusion  . TONSILLECTOMY    . tracheotomy  09/27/2016  . TUBAL LIGATION  06/1988   Social History   Occupational History  . Occupation: Air cabin crew  Tobacco Use  . Smoking status: Never Smoker  . Smokeless tobacco: Never Used  Substance and Sexual Activity  . Alcohol use: Yes    Alcohol/week: 7.0 standard drinks    Types: 7 Cans of beer per week  . Drug use: No  . Sexual activity: Yes    Birth control/protection: Surgical

## 2018-09-07 ENCOUNTER — Ambulatory Visit (INDEPENDENT_AMBULATORY_CARE_PROVIDER_SITE_OTHER): Payer: BC Managed Care – PPO | Admitting: Internal Medicine

## 2018-09-07 ENCOUNTER — Encounter: Payer: Self-pay | Admitting: Internal Medicine

## 2018-09-07 DIAGNOSIS — C0680 Malignant neoplasm of overlapping sites of unspecified parts of mouth: Secondary | ICD-10-CM | POA: Diagnosis not present

## 2018-09-07 DIAGNOSIS — G4719 Other hypersomnia: Secondary | ICD-10-CM

## 2018-09-07 MED ORDER — AMPHETAMINE-DEXTROAMPHETAMINE 10 MG PO TABS: ORAL_TABLET | ORAL | 0 refills | Status: DC

## 2018-09-07 NOTE — Assessment & Plan Note (Signed)
She only uses G tube for meds. Denies choking or strangling when eating or drinking.

## 2018-09-07 NOTE — Patient Instructions (Signed)
Adderall refill e-sent  You need to ask your primary doctor to refill the lomotil if you aren't going to a GI doctor anymore.

## 2018-09-07 NOTE — Progress Notes (Signed)
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  She does plan to find other work.  She continues to work on sleep hygiene as discussed. No recurrence of base of tongue cancer.  Uses a G-tube now only for meds.  Something she swallowed yesterday was too big got hung up and hurt as she hacked to get it out.  This left her with pain in throat.  Breathing comfortably.  A small recurrence on her hand is covered with a Band-Aid but she says it has been oozing blood for a week-on aspirin.  I cautioned her about signs of GI bleeding to watch for.  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  ROS-see HPI   + = positive Constitutional:    weight loss, night sweats, fevers, chills, +fatigue, lassitude. HEENT:    headaches, difficulty swallowing, tooth/dental problems, sore throat,       sneezing, itching, ear ache, nasal congestion, post nasal drip, snoring CV:    chest pain, orthopnea, PND, swelling in lower extremities, anasarca,                                                    dizziness, palpitations Resp:   shortness of breath with exertion or at rest.                productive cough,   non-productive cough, coughing up of blood.              change in color of mucus.  wheezing.   Skin:    rash or lesions. GI:  No-   heartburn, indigestion, abdominal pain, nausea, vomiting, diarrhea,                 change in bowel habits, loss of appetite GU: dysuria, change in color of urine, no urgency or frequency.   flank pain. MS:   joint pain, stiffness, decreased range of motion, back pain. Neuro-     nothing unusual Psych:  change in mood or affect.  depression or anxiety.   memory loss.  OBJ- Physical Exam General- Alert, Oriented, Affect-appropriate, Distress- none acute,  Skin- rash-none, lesions- none, excoriation- none Lymphadenopathy- none Head- atraumatic  Eyes- Gross vision intact, PERRLA, conjunctivae and secretions clear            Ears- Hearing, canals-normal            Nose- Clear, no-Septal dev, mucus, polyps, erosion, perforation             Throat- Mallampati III-IV , mucosa clear , drainage- none, tonsils- atrophic, + edentulous                       +speech lightly slurred.  I did not see obvious surgical changes. Neck- flexible , trachea midline, no stridor , thyroid nl, carotid no bruit Chest - symmetrical excursion , unlabored           Heart/CV- RRR , no murmur , no gallop  , no rub, nl  s1 s2                           - JVD- none , edema- none, stasis changes- none, varices- none           Lung- clear to P&A, wheeze- none, cough- none , dullness-none, rub- none           Chest wall- + postsurgical scars Abd-   + Gastric feeding tube Br/ Gen/ Rectal- Not done, not indicated Extrem- cyanosis- none, clubbing, none, atrophy- none, strength- nl Neuro- grossly intact to observation

## 2018-09-07 NOTE — Assessment & Plan Note (Signed)
Stable pattern without progression. No cataplexy. Feels night time sleep is adequate. Adderall remains very helpful. No concerns. Plan- continue addeerall. Nap when able, maintain sleep hygiene

## 2018-09-13 ENCOUNTER — Ambulatory Visit: Payer: BC Managed Care – PPO | Admitting: Internal Medicine

## 2018-09-13 ENCOUNTER — Other Ambulatory Visit (INDEPENDENT_AMBULATORY_CARE_PROVIDER_SITE_OTHER): Payer: Self-pay | Admitting: Specialist

## 2018-09-13 NOTE — Telephone Encounter (Signed)
Tramadol refill request

## 2018-09-14 ENCOUNTER — Encounter: Payer: Self-pay | Admitting: Family Medicine

## 2018-09-15 ENCOUNTER — Telehealth (INDEPENDENT_AMBULATORY_CARE_PROVIDER_SITE_OTHER): Payer: BC Managed Care – PPO | Admitting: Family Medicine

## 2018-09-15 ENCOUNTER — Other Ambulatory Visit: Payer: Self-pay

## 2018-09-15 DIAGNOSIS — R3915 Urgency of urination: Secondary | ICD-10-CM

## 2018-09-15 LAB — POC URINALSYSI DIPSTICK (AUTOMATED)
Bilirubin, UA: NEGATIVE
Blood, UA: NEGATIVE
Glucose, UA: NEGATIVE
Ketones, UA: NEGATIVE
Nitrite, UA: NEGATIVE
Protein, UA: NEGATIVE
Spec Grav, UA: 1.025 (ref 1.010–1.025)
Urobilinogen, UA: 1 E.U./dL
pH, UA: 6 (ref 5.0–8.0)

## 2018-09-15 MED ORDER — NITROFURANTOIN MONOHYD MACRO 100 MG PO CAPS: 100.0000 mg | ORAL_CAPSULE | Freq: Two times a day (BID) | ORAL | 0 refills | Status: AC

## 2018-09-15 NOTE — Progress Notes (Signed)
Virtual Visit via Video Note  I connected with Kristen [name] on 09/15/18 at  2:30 PM EDT by a video enabled telemedicine application 2/2 [id number] pandemic and verified that I am speaking with the correct person using two identifiers.  Location patient: home Location provider:work or home office Persons participating in the virtual visit: patient, provider  I discussed the limitations of evaluation and management by telemedicine and the availability of in person appointments. The patient expressed understanding and agreed to proceed.   HPI: Pt states feeling "off" and her urine has a strange odor x 1 wk. Endorses urinary frequency.  Denies fever, chills, dysuria.   Appetite is down, eating once a day. Trying to make herself eat more. Feeling tired more.  Helping her grandkids with virtual learning which is also contributing to her feeling drained.  Pt has a h/o anemia.  Not currently taking iron.  Endorses feeling hot and cold off and on all day since her last surgery.  BP 112/60 this am  Pt requesting her baclofen be filled by this provider.   Pt states she will likely have to have another back surgery.  States there is a "bone spur pressing on a nerve" in her back.  States will also likely have another spinal fusion at that time.  Pt had influenza vaccine done yesterday.  ROS: See pertinent positives and negatives per HPI.  Past Medical History:  Diagnosis Date  . Allergy   . Anemia    Iron deficiency  . Anxiety   . Arthritis    back, left shoulder  . Blood transfusion without reported diagnosis   . Cancer ([place])   . Chicken pox   . Depression   . Diarrhea    takes Imodium daily  . GERD (gastroesophageal reflux disease)   . H/O hiatal hernia   . Headache(784.0)   . History of kidney stones    1996ish  . History of radiation therapy 11/16/16- 01/01/17   Base of Tongue/ 66 gy in 33 fractions/ Dose: 2 Gy  . Hypertension   . Migraine    none for 5 years (as of 01/13/16)  .  OSA (obstructive sleep apnea) 10/13/2015   unable to get cpap, plans to get one in 2018  . Pneumonia   . Restless legs   . Scoliosis   . Shortness of breath    with exertion  . Sleep apnea     Past Surgical History:  Procedure Laterality Date  . BACK SURGERY  07/21/1978  . CHOLECYSTECTOMY N/A 08/15/2013   Procedure: LAPAROSCOPIC CHOLECYSTECTOMY WITH INTRAOPERATIVE CHOLANGIOGRAM;  Surgeon: Gwenyth Ober, MD;  Location: [place];  Service: General;  Laterality: N/A;  . COLONOSCOPY N/A 01/09/2013   Procedure: COLONOSCOPY;  Surgeon: Beryle Beams, MD;  Location: [place];  Service: Endoscopy;  Laterality: N/A;  . DIRECT LARYNGOSCOPY  07/2016   Dr. Nicolette Bang [facility]  . GASTROSTOMY TUBE PLACEMENT  09/27/2016  . HERNIA REPAIR Left 1981  . IR PATIENT EVAL TECH 0-60 MINS  12/13/2016  . IR PATIENT EVAL TECH 0-60 MINS  04/11/2017  . IR PATIENT EVAL TECH 0-60 MINS  03/24/2018  . IR REPLACE G-TUBE SIMPLE WO FLUORO  12/12/2017  . IR REPLACE G-TUBE SIMPLE WO FLUORO  03/29/2018  . IR REPLACE G-TUBE SIMPLE WO FLUORO  06/29/2018  . MODIFIED RADICAL NECK DISSECTION Left 09/27/2016   Levels 1 & 2  . PARTIAL GLOSSECTOMY Left 09/27/2016   Left hemi partial glossectomy  . SPINE SURGERY  01/20/2016  fusion  . TONSILLECTOMY    . tracheotomy  09/27/2016  . TUBAL LIGATION  06/1988    Family History  Problem Relation Age of Onset  . Heart disease Father   . Heart attack Father   . Hypertension Mother   . Arthritis Mother   . Diabetes Maternal Grandmother   . Diabetes Paternal Grandmother   . Diabetes Maternal Uncle   . Colon polyps Neg Hx   . Colon cancer Neg Hx   . Esophageal cancer Neg Hx   . Stomach cancer Neg Hx   . Rectal cancer Neg Hx     Current Outpatient Medications:  .  amLODipine (NORVASC) 10 MG tablet, TAKE 1 TABLET BY MOUTH EVERY DAY, Disp: 90 tablet, Rfl: 3 .  amphetamine-dextroamphetamine (ADDERALL) 10 MG tablet, 1-3 daily as needed for sleepiness, Disp: 90 tablet, Rfl: 0 .  aspirin  81 MG chewable tablet, Place 81 mg into feeding tube daily. , Disp: , Rfl:  .  baclofen (LIORESAL) 10 MG tablet, PLACE 1 TABLET INTO FEEDING TUBE THREE TIMES DAILY, Disp: 30 tablet, Rfl: 0 .  calcium carbonate (OSCAL) 1500 (600 Ca) MG TABS tablet, 600 mg of elemental calcium 2 (two) times daily with a meal. Per tube, Disp: , Rfl:  .  cephALEXin (KEFLEX) 500 MG capsule, Take 1 capsule (500 mg total) by mouth 2 (two) times daily., Disp: 14 capsule, Rfl: 0 .  chlorhexidine (PERIDEX) 0.12 % solution, USE UTD 15 MLS IN THE MOUTH OR THROAT BID, Disp: , Rfl:  .  diclofenac (VOLTAREN) 75 MG EC tablet, TAKE 1 TABLET BY MOUTH TWICE DAILY WITH FOOD AS NEEDED FOR PAIN, Disp: 180 tablet, Rfl: 3 .  diphenoxylate-atropine (LOMOTIL) 2.5-0.025 MG tablet, Take 2 tablets by mouth 2 (two) times daily. Or via tube, Disp: 120 tablet, Rfl: 3 .  ergocalciferol (DRISDOL) 8000 UNIT/ML drops, Place 6 mLs (48,000 Units total) into feeding tube once a week., Disp: 60 mL, Rfl: 1 .  gabapentin (NEURONTIN) 300 MG capsule, Take 1 capsule (300 mg total) by mouth 3 (three) times daily., Disp: 270 capsule, Rfl: 0 .  HYDROcodone-acetaminophen (NORCO/VICODIN) 5-325 MG tablet, Take 1 tablet by mouth every 6 (six) hours as needed for moderate pain., Disp: 30 tablet, Rfl: 0 .  hydrocortisone cream 1 %, APPLY EXTERNALLY TO THE AFFECTED AREA TWICE DAILY, Disp: 30 g, Rfl: 0 .  levocetirizine (XYZAL) 5 MG tablet, Take 1 tablet (5 mg total) by mouth every evening., Disp: 30 tablet, Rfl: 0 .  lidocaine (XYLOCAINE) 2 % solution, RINSE AND GARGLE 5 ML PO OR THROAT Q 3 H FOR 100 DOSES UTD, Disp: , Rfl:  .  LORazepam (ATIVAN) 0.5 MG tablet, Take 1 tab po at the MRI scanner for anxiety., Disp: 1 tablet, Rfl: 0 .  magic mouthwash w/lidocaine SOLN, Use 2 teaspoons swish and spit every 4-6 hours prn, Disp: 150 mL, Rfl: 0 .  meloxicam (MOBIC) 15 MG tablet, TAKE 1 TABLET(15 MG) BY MOUTH DAILY, Disp: 30 tablet, Rfl: 2 .  pantoprazole (PROTONIX) 40 MG  tablet, TAKE 1 TABLET BY MOUTH DAILY, Disp: 90 tablet, Rfl: 3 .  Potassium 99 MG TABS, 1 tablet (99 mg total) by Gastrostomy Tube route daily. (Patient taking differently: Place 1 tablet into feeding tube daily. ), Disp: 330 tablet, Rfl: 1 .  Prasterone (INTRAROSA) 6.5 MG INST, [place] 6.5 mg vaginal insert  Insert 1 vaginal insert every day by vaginal route at bedtime for 28 days.  START PRESCRIBED MEDICATION AND USE FOR  ANOTHER MONTH AFTER FINISHING THE FREE SAMPLES, Disp: , Rfl:  .  SUMAtriptan (IMITREX) 100 MG tablet, Take 0.5-1 tablet by mouth at the onset of migraine and may repeat in 2 hours if headache persists or recurs., Disp: 10 tablet, Rfl: 0 .  traMADol (ULTRAM) 50 MG tablet, TAKE 2 TABLETS(100 MG) BY MOUTH TWICE DAILY, Disp: 28 tablet, Rfl: 0  EXAM:  VITALS per patient if applicable:  GENERAL: alert, oriented, appears well and in no acute distress  HEENT: atraumatic, conjunctiva clear, no obvious abnormalities on inspection of external nose and ears  NECK: normal movements of the head and neck  LUNGS: on inspection no signs of respiratory distress, breathing rate appears normal, no obvious gross SOB, gasping or wheezing  CV: no obvious cyanosis  MS: moves all visible extremities without noticeable abnormality  PSYCH/NEURO: pleasant and cooperative, no obvious depression or anxiety, speech and thought processing grossly intact  ASSESSMENT AND PLAN:  Discussed the following assessment and plan:  Urinary urgency  -UA with 2+ leuks, SG 1.025.  No RBCs -will send for UCx - Plan: POCT Urinalysis Dipstick (Automated), Urine Culture, nitrofurantoin, macrocrystal-monohydrate, (MACROBID) 100 MG capsule  Discussed rechecking CBC and iron in the next few wks given pt's h/o anemia.  F/u prn   I discussed the assessment and treatment plan with the patient. The patient was provided an opportunity to ask questions and all were answered. The patient agreed with the plan and  demonstrated an understanding of the instructions.   The patient was advised to call back or seek an in-person evaluation if the symptoms worsen or if the condition fails to improve as anticipated.   [name], MD

## 2018-09-15 NOTE — Telephone Encounter (Signed)
Spoke with pt scheduled for a virtual visit with Dr Volanda Napoleon this afternoon for UTI

## 2018-09-16 LAB — URINE CULTURE
MICRO NUMBER:: 824378
SPECIMEN QUALITY:: ADEQUATE

## 2018-09-18 NOTE — Telephone Encounter (Signed)
Pt was seen virtually by Dr Volanda Napoleon for UTI

## 2018-09-27 ENCOUNTER — Telehealth: Payer: Self-pay

## 2018-09-27 NOTE — Telephone Encounter (Signed)
Copied from [place] [id number]. Topic: General - Inquiry >> Sep 26, 2018  4:31 PM Reyne Dumas L wrote: Reason for CRM:  Spoke with pt advised to ask the Orthopedic office to refax pt clearance forms to the office      Pt wants to know where she stands on her surgical release that was supposed to be faxed to Dr. Volanda Napoleon from Dr. Basil Dess at [facility]. Pt can be reached at [phone]

## 2018-10-02 ENCOUNTER — Encounter: Payer: Self-pay | Admitting: Specialist

## 2018-10-02 ENCOUNTER — Telehealth: Payer: Self-pay

## 2018-10-02 ENCOUNTER — Encounter: Payer: Self-pay | Admitting: Family Medicine

## 2018-10-02 ENCOUNTER — Other Ambulatory Visit: Payer: Self-pay | Admitting: Specialist

## 2018-10-02 MED ORDER — BACLOFEN 10 MG PO TABS: ORAL_TABLET | ORAL | 6 refills | Status: DC

## 2018-10-02 NOTE — Telephone Encounter (Signed)
Pt refill request for Baclofen was sent to her specialist for refill

## 2018-10-02 NOTE — Telephone Encounter (Signed)
noted 

## 2018-10-02 NOTE — Telephone Encounter (Signed)
Rx refilled with #90 to be taken TID with 6 refills.

## 2018-10-06 NOTE — Telephone Encounter (Signed)
Pt new phone number has been updated on pt chart

## 2018-10-07 ENCOUNTER — Encounter: Payer: Self-pay | Admitting: Family Medicine

## 2018-10-07 ENCOUNTER — Encounter: Payer: Self-pay | Admitting: Specialist

## 2018-10-10 ENCOUNTER — Other Ambulatory Visit (INDEPENDENT_AMBULATORY_CARE_PROVIDER_SITE_OTHER): Payer: Self-pay | Admitting: Specialist

## 2018-10-11 ENCOUNTER — Telehealth: Payer: Self-pay

## 2018-10-11 NOTE — Telephone Encounter (Signed)
  Medical release form was received and has been placed on Dr Volanda Napoleon desk for completing, pt is aware    Copied from [place] [id number]. Topic: General - Other >> Oct 11, 2018  2:51 PM Carolyn Stare wrote: Pt is asking if medical release form was received from Dr Louanne Skye

## 2018-10-17 ENCOUNTER — Encounter: Payer: Self-pay | Admitting: Specialist

## 2018-10-19 ENCOUNTER — Encounter: Payer: Self-pay | Admitting: Family Medicine

## 2018-10-19 NOTE — Telephone Encounter (Signed)
Patient called to inquire of Dr Volanda Napoleon nurse if Medical release form have already been faxed back to Dr Louanne Skye office so that she can get started for her surgery. Please call patient Ph#  [phone]

## 2018-10-19 NOTE — Telephone Encounter (Signed)
LVM on pt phone with details on her Medical clearance form

## 2018-10-23 NOTE — Telephone Encounter (Signed)
Spoke wit pt states that Dr Flavia Shipper office received the Surgical Clearance Forms and she has been scheduled for surgery

## 2018-10-23 NOTE — Telephone Encounter (Signed)
Spoke with pt aware that her Surgical forms were completed and faxed to the surgeons office

## 2018-10-24 ENCOUNTER — Other Ambulatory Visit: Payer: Self-pay

## 2018-10-26 ENCOUNTER — Other Ambulatory Visit (HOSPITAL_COMMUNITY): Payer: Self-pay | Admitting: Radiology

## 2018-10-26 DIAGNOSIS — R633 Feeding difficulties, unspecified: Secondary | ICD-10-CM

## 2018-10-31 NOTE — Progress Notes (Signed)
[facility] #[id number] [place], [place] [address] [place] [address] Phone: [phone] Fax: [phone]  [place], [place] [address] [place] [address] [place] [address] Phone: [phone] Fax: [phone]      Your procedure is scheduled on Monday, October 19th, 2020.  Report to [facility] Main Entrance "A" at 5:30 A.M., and check in at the Admitting office.   Call this number if you have problems the morning of surgery:  [phone]  Call [phone] if you have any questions prior to your surgery date Monday-Friday 8am-4pm    Remember:  Do not eat after midnight the night before your surgery  You may drink clear liquids until 4:30AM the morning of your surgery.   Clear liquids allowed are: Water, Non-Citrus Juices (without pulp), Carbonated Beverages, Clear Tea, Black Coffee Only, and Gatorade   Please complete your PRE-SURGERY ENSURE that was provided to you by 4:30AM the morning of surgery.  Please, if able, drink it in one setting. DO NOT SIP.   Take these medicines the morning of surgery per G-tube with A SMALL AMOUNT OF WATER for TUBE FLUSH :  Amlodipine (Norvasc) Baclofen (Lioresal)  Gabapentin (Neurontin) Magic Mouthwash w/Lidocaine Solution - if needed Pantoprazole (Protonix) Tramadol (Ultram) - if needed  7 days prior to surgery STOP taking any Aspirin (unless otherwise instructed by your surgeon), Aleve, Naproxen, Ibuprofen, Motrin, Advil, Goody's, BC's, all herbal medications, fish oil, and all vitamins.    The Morning of Surgery  Do not wear jewelry, make-up or nail polish.  Do not wear lotions, powders, or perfumes/colognes, or deodorant  Do not shave 48 hours prior to surgery.  Men may shave face and neck.  Do not bring valuables to the hospital.  [facility] is not responsible for any belongings or valuables.  If you are a smoker,  DO NOT Smoke 24 hours prior to surgery IF you wear a CPAP at night please bring your mask, tubing, and machine the morning of surgery   Remember that you must have someone to transport you home after your surgery, and remain with you for 24 hours if you are discharged the same day.   Contacts, glasses, hearing aids, dentures or bridgework may not be worn into surgery.    Leave your suitcase in the car.  After surgery it may be brought to your room.  For patients admitted to the hospital, discharge time will be determined by your treatment team.  Patients discharged the day of surgery will not be allowed to drive home.    Special instructions:   [place]- Preparing For Surgery  Before surgery, you can play an important role. Because skin is not sterile, your skin needs to be as free of germs as possible. You can reduce the number of germs on your skin by washing with CHG (chlorahexidine gluconate) Soap before surgery.  CHG is an antiseptic cleaner which kills germs and bonds with the skin to continue killing germs even after washing.    Oral Hygiene is also important to reduce your risk of infection.  Remember - BRUSH YOUR TEETH THE MORNING OF SURGERY WITH YOUR REGULAR TOOTHPASTE  Please do not use if you have an allergy to CHG or antibacterial soaps. If your skin becomes reddened/irritated stop using the CHG.  Do not shave (including legs and underarms) for at least 48 hours prior to first CHG  shower. It is OK to shave your face.  Please follow these instructions carefully.   1. Shower the NIGHT BEFORE SURGERY and the MORNING OF SURGERY with CHG Soap.   2. If you chose to wash your hair, wash your hair first as usual with your normal shampoo.  3. After you shampoo, rinse your hair and body thoroughly to remove the shampoo.  4. Use CHG as you would any other liquid soap. You can apply CHG directly to the skin and wash gently with a scrungie or a clean washcloth.   5. Apply the  CHG Soap to your body ONLY FROM THE NECK DOWN.  Do not use on open wounds or open sores. Avoid contact with your eyes, ears, mouth and genitals (private parts). Wash Face and genitals (private parts)  with your normal soap.   6. Wash thoroughly, paying special attention to the area where your surgery will be performed.  7. Thoroughly rinse your body with warm water from the neck down.  8. DO NOT shower/wash with your normal soap after using and rinsing off the CHG Soap.  9. Pat yourself dry with a CLEAN TOWEL.  10. Wear CLEAN PAJAMAS to bed the night before surgery, wear comfortable clothes the morning of surgery  11. Place CLEAN SHEETS on your bed the night of your first shower and DO NOT SLEEP WITH PETS.    Day of Surgery:  Do not apply any deodorants/lotions. Please shower the morning of surgery with the CHG soap  Please wear clean clothes to the hospital/surgery center.   Remember to brush your teeth WITH YOUR REGULAR TOOTHPASTE.   Please read over the following fact sheets that you were given.

## 2018-11-01 ENCOUNTER — Encounter: Payer: Self-pay | Admitting: Surgery

## 2018-11-01 ENCOUNTER — Encounter (HOSPITAL_COMMUNITY): Payer: Self-pay

## 2018-11-01 ENCOUNTER — Encounter (HOSPITAL_COMMUNITY)
Admission: RE | Admit: 2018-11-01 | Discharge: 2018-11-01 | Disposition: A | Discharge status: Home / Self Care | Payer: BC Managed Care – PPO | Source: Ambulatory Visit | Attending: Specialist | Admitting: Specialist

## 2018-11-01 ENCOUNTER — Other Ambulatory Visit: Payer: Self-pay

## 2018-11-01 ENCOUNTER — Ambulatory Visit (INDEPENDENT_AMBULATORY_CARE_PROVIDER_SITE_OTHER): Payer: BC Managed Care – PPO | Admitting: Surgery

## 2018-11-01 VITALS — BP 124/81 | HR 64 | Temp 98.0°F | Ht 69.0 in | Wt 192.0 lb

## 2018-11-01 DIAGNOSIS — M4316 Spondylolisthesis, lumbar region: Secondary | ICD-10-CM

## 2018-11-01 DIAGNOSIS — R001 Bradycardia, unspecified: Secondary | ICD-10-CM | POA: Diagnosis not present

## 2018-11-01 DIAGNOSIS — Z01818 Encounter for other preprocedural examination: Secondary | ICD-10-CM | POA: Diagnosis not present

## 2018-11-01 DIAGNOSIS — I10 Essential (primary) hypertension: Secondary | ICD-10-CM | POA: Diagnosis not present

## 2018-11-01 DIAGNOSIS — M4807 Spinal stenosis, lumbosacral region: Secondary | ICD-10-CM

## 2018-11-01 DIAGNOSIS — R9431 Abnormal electrocardiogram [ECG] [EKG]: Secondary | ICD-10-CM | POA: Insufficient documentation

## 2018-11-01 LAB — SURGICAL PCR SCREEN
MRSA, PCR: NEGATIVE
Staphylococcus aureus: NEGATIVE

## 2018-11-01 LAB — COMPREHENSIVE METABOLIC PANEL
ALT: 17 U/L (ref 0–44)
AST: 18 U/L (ref 15–41)
Albumin: 3.9 g/dL (ref 3.5–5.0)
Alkaline Phosphatase: 84 U/L (ref 38–126)
Anion gap: 9 (ref 5–15)
BUN: 9 mg/dL (ref 6–20)
CO2: 27 mmol/L (ref 22–32)
Calcium: 9.2 mg/dL (ref 8.9–10.3)
Chloride: 106 mmol/L (ref 98–111)
Creatinine, Ser: 0.7 mg/dL (ref 0.44–1.00)
GFR calc Af Amer: >60 mL/min (ref >60)
GFR calc non Af Amer: >60 mL/min (ref >60)
Glucose, Bld: 102 mg/dL — ABNORMAL HIGH (ref 70–99)
Potassium: 3.6 mmol/L (ref 3.5–5.1)
Sodium: 142 mmol/L (ref 135–145)
Total Bilirubin: 1.3 mg/dL — ABNORMAL HIGH (ref 0.3–1.2)
Total Protein: 6.7 g/dL (ref 6.5–8.1)

## 2018-11-01 LAB — CBC
HCT: 38.9 % (ref 36.0–46.0)
Hemoglobin: 12.2 g/dL (ref 12.0–15.0)
MCH: 26.6 pg (ref 26.0–34.0)
MCHC: 31.4 g/dL (ref 30.0–36.0)
MCV: 84.9 fL (ref 80.0–100.0)
Platelets: 210 10*3/uL (ref 150–400)
RBC: 4.58 MIL/uL (ref 3.87–5.11)
RDW: 15.8 % — ABNORMAL HIGH (ref 11.5–15.5)
WBC: 3.3 10*3/uL — ABNORMAL LOW (ref 4.0–10.5)
nRBC: 0 % (ref 0.0–0.2)

## 2018-11-01 LAB — TYPE AND SCREEN
ABO/RH(D): B POS
Antibody Screen: NEGATIVE

## 2018-11-01 LAB — PROTIME-INR
INR: 1.1 (ref 0.8–1.2)
Prothrombin Time: 13.8 seconds (ref 11.4–15.2)

## 2018-11-01 NOTE — Progress Notes (Signed)
PCP - Grier Mitts Cardiologist - denies    Chest x-ray - 03/24/18 1 view 06/2018 EKG - 11/01/18 Stress Test - denies ECHO - 01/23/16 Cardiac Cath - denies  Sleep Study - 2019 CPAP - denies   ERAS Protcol - yes Clear Liquids until 0430 am PRE-SURGERY Ensure or G2-  Pre-surgery ensure   COVID TEST- scheduled 10/15   Anesthesia review: yes, hx tongue cancer, patient had part of her tongue removed has Gtube only for medications due to difficulty swallowing, patient is able to tolerate eating and drinking fine. Spoke with Jeneen Rinks PA before sending patient home  Patient denies shortness of breath, fever, cough and chest pain at PAT appointment   All instructions explained to the patient, with a verbal understanding of the material. Patient agrees to go over the instructions while at home for a better understanding. Patient also instructed to self quarantine after being tested for COVID-19. The opportunity to ask questions was provided.

## 2018-11-01 NOTE — Progress Notes (Signed)
53 year old black female history of lumbar stenosis comes in for preop evaluation.  Symptoms unchanged from previous visit.  She is wanting to proceed with Explore fusion L3-4, probable transforaminal lumbar interbody fusion with revision of L3 bilateral pedicle screws, BMP and local bone graft and Vivigen, Extension of fusion to L5-S1 with rods, screws and cages, Vivigen, local bone graft, allograft bone graft and BMP.  We have received preop medical clearance.  Surgical seizure discussed.  All questions answered.  Anticipate 2 to 4-day hospital stay.  Patient states that she will have assistance after discharge at home.

## 2018-11-01 NOTE — Progress Notes (Signed)
[facility]ORE #[id number] [place], [place] [address] [place] [address] Phone: [phone] Fax: [phone]  [place], [place] [address] [place] [address] Phone: [phone] Fax: [phone]      Your procedure is scheduled on October 19  Report to [facility] Main Entrance "A" at 0530 A.M., and check in at the Admitting office.  Call this number if you have problems the morning of surgery:  [phone]  Call [phone] if you have any questions prior to your surgery date Monday-Friday 8am-4pm    Remember:  Do not eat after midnight the night before your surgery  You may drink clear liquids until 0430 am the morning of your surgery.   Clear liquids allowed are: Water, Non-Citrus Juices (without pulp), Carbonated Beverages, Clear Tea, Black Coffee Only, and Gatorade   Enhanced Recovery after Surgery for Orthopedics Enhanced Recovery after Surgery is a protocol used to improve the stress on your body and your recovery after surgery.  Patient Instructions  . The night before surgery:  o No food after midnight. ONLY clear liquids after midnight  .  Marland Kitchen The day of surgery (if you do NOT have diabetes):  o Drink ONE (1) Pre-Surgery Clear Ensure as directed.   o This drink was given to you during your hospital  pre-op appointment visit. o The pre-op nurse will instruct you on the time to drink the  Pre-Surgery Ensure depending on your surgery time. o Finish the drink at the designated time by the pre-op nurse.  o Nothing else to drink after completing the  Pre-Surgery Clear Ensure.          If you have questions, please contact your surgeon's office.     Take these medicines the morning of surgery with A SIP OF WATER  amLODipine (NORVASC) baclofen (LIORESAL) gabapentin (NEURONTIN)  pantoprazole (PROTONIX) traMADol (ULTRAM) if needed  7  days prior to surgery STOP taking any diclofenac (VOLTAREN), meloxicam (MOBIC), Aleve, Naproxen, Ibuprofen, Motrin, Advil, Goody's, BC's, all herbal medications, fish oil, and all vitamins.    The Morning of Surgery  Do not wear jewelry, make-up or nail polish.  Do not wear lotions, powders, or perfumes/colognes, or deodorant  Do not shave 48 hours prior to surgery.   Do not bring valuables to the hospital.  [facility] is not responsible for any belongings or valuables.  If you are a smoker, DO NOT Smoke 24 hours prior to surgery IF you wear a CPAP at night please bring your mask, tubing, and machine the morning of surgery   Remember that you must have someone to transport you home after your surgery, and remain with you for 24 hours if you are discharged the same day.   Contacts, glasses, hearing aids, dentures or bridgework may not be worn into surgery.    Leave your suitcase in the car.  After surgery it may be brought to your room.  For patients admitted to the hospital, discharge time will be determined by your treatment team.  Patients discharged the day of surgery will not be allowed to drive home.    Special instructions:   [place]- Preparing For Surgery  Before surgery, you can play an important role. Because skin is not sterile, your skin needs to be as free of germs as possible. You can reduce the number of germs on your skin by  washing with CHG (chlorahexidine gluconate) Soap before surgery.  CHG is an antiseptic cleaner which kills germs and bonds with the skin to continue killing germs even after washing.    Oral Hygiene is also important to reduce your risk of infection.  Remember - BRUSH YOUR TEETH THE MORNING OF SURGERY WITH YOUR REGULAR TOOTHPASTE  Please do not use if you have an allergy to CHG or antibacterial soaps. If your skin becomes reddened/irritated stop using the CHG.  Do not shave (including legs and underarms) for at least 48 hours prior to first  CHG shower. It is OK to shave your face.  Please follow these instructions carefully.   1. Shower the NIGHT BEFORE SURGERY and the MORNING OF SURGERY with CHG Soap.   2. If you chose to wash your hair, wash your hair first as usual with your normal shampoo.  3. After you shampoo, rinse your hair and body thoroughly to remove the shampoo.  4. Use CHG as you would any other liquid soap. You can apply CHG directly to the skin and wash gently with a scrungie or a clean washcloth.   5. Apply the CHG Soap to your body ONLY FROM THE NECK DOWN.  Do not use on open wounds or open sores. Avoid contact with your eyes, ears, mouth and genitals (private parts). Wash Face and genitals (private parts)  with your normal soap.   6. Wash thoroughly, paying special attention to the area where your surgery will be performed.  7. Thoroughly rinse your body with warm water from the neck down.  8. DO NOT shower/wash with your normal soap after using and rinsing off the CHG Soap.  9. Pat yourself dry with a CLEAN TOWEL.  10. Wear CLEAN PAJAMAS to bed the night before surgery, wear comfortable clothes the morning of surgery  11. Place CLEAN SHEETS on your bed the night of your first shower and DO NOT SLEEP WITH PETS.    Day of Surgery:  Do not apply any deodorants/lotions. Please shower the morning of surgery with the CHG soap  Please wear clean clothes to the hospital/surgery center.   Remember to brush your teeth WITH YOUR REGULAR TOOTHPASTE.   Please read over the following fact sheets that you were given.

## 2018-11-02 ENCOUNTER — Encounter (HOSPITAL_COMMUNITY): Payer: Self-pay | Admitting: Student

## 2018-11-02 ENCOUNTER — Ambulatory Visit (HOSPITAL_COMMUNITY)
Admission: RE | Admit: 2018-11-02 | Discharge: 2018-11-02 | Disposition: A | Discharge status: Home / Self Care | Payer: BC Managed Care – PPO | Source: Ambulatory Visit | Attending: Radiology | Admitting: Radiology

## 2018-11-02 ENCOUNTER — Other Ambulatory Visit (HOSPITAL_COMMUNITY)
Admission: RE | Admit: 2018-11-02 | Discharge: 2018-11-02 | Disposition: A | Discharge status: Home / Self Care | Payer: BC Managed Care – PPO | Source: Ambulatory Visit | Attending: Specialist | Admitting: Specialist

## 2018-11-02 DIAGNOSIS — Z431 Encounter for attention to gastrostomy: Secondary | ICD-10-CM | POA: Insufficient documentation

## 2018-11-02 DIAGNOSIS — Z20828 Contact with and (suspected) exposure to other viral communicable diseases: Secondary | ICD-10-CM | POA: Diagnosis not present

## 2018-11-02 DIAGNOSIS — R633 Feeding difficulties, unspecified: Secondary | ICD-10-CM

## 2018-11-02 HISTORY — PX: IR REPLACE G-TUBE SIMPLE WO FLUORO: IMG2323

## 2018-11-02 MED ORDER — SILVER NITRATE-POT NITRATE 75-25 % EX MISC
CUTANEOUS | Status: AC
  Filled 2018-11-02: qty 1

## 2018-11-02 NOTE — Anesthesia Preprocedure Evaluation (Addendum)
Anesthesia Evaluation  Patient identified by MRN, date of birth, ID band  Reviewed: Allergy & Precautions, H&P , NPO status , Patient's Chart, lab work & pertinent test results  Airway Mallampati: II  TM Distance: >3 FB Neck ROM: Full    Dental no notable dental hx. (+) Edentulous Lower, Edentulous Upper, Dental Advisory Given   Pulmonary sleep apnea ,    Pulmonary exam normal breath sounds clear to auscultation       Cardiovascular Exercise Tolerance: Good hypertension, Pt. on medications  Rhythm:Regular Rate:Normal     Neuro/Psych  Headaches, Anxiety Depression    GI/Hepatic Neg liver ROS, hiatal hernia, GERD  Medicated and Controlled,  Endo/Other  negative endocrine ROS  Renal/GU negative Renal ROS  negative genitourinary   Musculoskeletal  (+) Arthritis , Osteoarthritis,    Abdominal   Peds  Hematology  (+) Blood dyscrasia, anemia ,   Anesthesia Other Findings   Reproductive/Obstetrics negative OB ROS                           Anesthesia Physical Anesthesia Plan  ASA: III  Anesthesia Plan: General   Post-op Pain Management:    Induction: Intravenous  PONV Risk Score and Plan: 4 or greater and Ondansetron, Dexamethasone and Midazolam  Airway Management Planned: Oral ETT  Additional Equipment:   Intra-op Plan:   Post-operative Plan: Extubation in OR  Informed Consent: I have reviewed the patients History and Physical, chart, labs and discussed the procedure including the risks, benefits and alternatives for the proposed anesthesia with the patient or authorized representative who has indicated his/her understanding and acceptance.     Dental advisory given  Plan Discussed with: CRNA  Anesthesia Plan Comments: (She underwent resection of base of tongue cancer and left selective neck dissection (levels 1 and 2) on 09/27/16, along with Gtube placement. She then underwent RT,  completed January 01, 2017. She briefly had a tracheostomy tube but that was decannulated not long after surgery. She continues to use a g-tube for medications only because she has some difficulty swallowing pills. Continues to follow with ENT Dr. Nicolette [facility] at [facility]. Last seen 10/31/18, doing well per notes.   Pt had an eval of chest pain Jan 2018. Per cardiology notes, the discomfort was similar to her usual GERD symptoms and she had missed several doses of PPI. Enzymes negative. Echo showed normal LV function. She was recommended to continue PPI as prescribed.   Preop labs reviewed, unremarkable.  EKG 11/01/18: Sinus bradycardia. Rate 54. Nonspecific T wave abnormality.  TTE 01/23/16: - Left ventricle: The cavity size was normal. Wall thickness was   increased in a pattern of moderate LVH. Systolic function was   normal. The estimated ejection fraction was in the range of 60%   to 65%. Wall motion was normal; there were no regional wall   motion abnormalities. Doppler parameters are consistent with   abnormal left ventricular relaxation (grade 1 diastolic   dysfunction). - Mitral valve: There was mild regurgitation. - Left atrium: The atrium was moderately dilated. )       Anesthesia Quick Evaluation

## 2018-11-02 NOTE — Progress Notes (Signed)
Anesthesia Chart Review: She underwent resection of base of tongue cancer and left selective neck dissection (levels 1 and 2) on 09/27/16, along with Gtube placement. She then underwent RT, completed January 01, 2017. She briefly had a tracheostomy tube but that was decannulated not long after surgery. She continues to use a g-tube for medications only because she has some difficulty swallowing pills. Continues to follow with ENT Dr. Nicolette Bang at [facility]. Last seen 10/31/18, doing well per notes.   Pt had an eval of chest pain Jan 2018. Per cardiology notes, the discomfort was similar to her usual GERD symptoms and she had missed several doses of PPI. Enzymes negative. Echo showed normal LV function. She was recommended to continue PPI as prescribed.   Preop labs reviewed, unremarkable.  EKG 11/01/18: Sinus bradycardia. Rate 54. Nonspecific T wave abnormality.  TTE 01/23/16: - Left ventricle: The cavity size was normal. Wall thickness was   increased in a pattern of moderate LVH. Systolic function was   normal. The estimated ejection fraction was in the range of 60%   to 65%. Wall motion was normal; there were no regional wall   motion abnormalities. Doppler parameters are consistent with   abnormal left ventricular relaxation (grade 1 diastolic   dysfunction). - Mitral valve: There was mild regurgitation. - Left atrium: The atrium was moderately dilated.   Wynonia Musty [facility] Short Stay Center/Anesthesiology Phone [phone] 11/02/2018 9:49 AM

## 2018-11-02 NOTE — Procedures (Signed)
PROCEDURE SUMMARY:  Successful replacement of 18 Fr balloon retention gastrostomy tube. 8 cc NS placed into balloon. Tube secured to skin site. Silver nitrate applied to granuloma located on the right side of tube insertion site. Patient tolerated procedure well. No immediate complications. EBL = 0 mL.  Please see imaging section of Epic for full dictation.   Claris Pong Louk PA-C 11/02/2018 4:19 PM

## 2018-11-04 LAB — NOVEL CORONAVIRUS, NAA (HOSP ORDER, SEND-OUT TO REF LAB; TAT 18-24 HRS): SARS-CoV-2, NAA: NOT DETECTED

## 2018-11-06 ENCOUNTER — Other Ambulatory Visit: Payer: Self-pay

## 2018-11-06 ENCOUNTER — Inpatient Hospital Stay (HOSPITAL_COMMUNITY): Payer: BC Managed Care – PPO

## 2018-11-06 ENCOUNTER — Inpatient Hospital Stay (HOSPITAL_COMMUNITY)
Admission: RE | Admit: 2018-11-06 | Discharge: 2018-11-09 | DRG: 455 | Disposition: A | Discharge status: Home Health | Payer: BC Managed Care – PPO | Attending: Specialist | Admitting: Specialist

## 2018-11-06 ENCOUNTER — Inpatient Hospital Stay (HOSPITAL_COMMUNITY)
Admission: RE | Disposition: A | Discharge status: Home Health | Payer: Self-pay | Source: Home / Self Care | Attending: Specialist

## 2018-11-06 ENCOUNTER — Encounter (HOSPITAL_COMMUNITY): Payer: Self-pay

## 2018-11-06 ENCOUNTER — Inpatient Hospital Stay (HOSPITAL_COMMUNITY): Payer: BC Managed Care – PPO | Admitting: Physician Assistant

## 2018-11-06 ENCOUNTER — Inpatient Hospital Stay (HOSPITAL_COMMUNITY): Payer: BC Managed Care – PPO | Admitting: Anesthesiology

## 2018-11-06 DIAGNOSIS — Z8249 Family history of ischemic heart disease and other diseases of the circulatory system: Secondary | ICD-10-CM | POA: Diagnosis not present

## 2018-11-06 DIAGNOSIS — K219 Gastro-esophageal reflux disease without esophagitis: Secondary | ICD-10-CM | POA: Diagnosis present

## 2018-11-06 DIAGNOSIS — Z419 Encounter for procedure for purposes other than remedying health state, unspecified: Secondary | ICD-10-CM

## 2018-11-06 DIAGNOSIS — D509 Iron deficiency anemia, unspecified: Secondary | ICD-10-CM | POA: Diagnosis present

## 2018-11-06 DIAGNOSIS — M5117 Intervertebral disc disorders with radiculopathy, lumbosacral region: Secondary | ICD-10-CM | POA: Diagnosis present

## 2018-11-06 DIAGNOSIS — Z934 Other artificial openings of gastrointestinal tract status: Secondary | ICD-10-CM

## 2018-11-06 DIAGNOSIS — Z7982 Long term (current) use of aspirin: Secondary | ICD-10-CM

## 2018-11-06 DIAGNOSIS — G2581 Restless legs syndrome: Secondary | ICD-10-CM | POA: Diagnosis present

## 2018-11-06 DIAGNOSIS — Z85810 Personal history of malignant neoplasm of tongue: Secondary | ICD-10-CM

## 2018-11-06 DIAGNOSIS — M48061 Spinal stenosis, lumbar region without neurogenic claudication: Principal | ICD-10-CM | POA: Diagnosis present

## 2018-11-06 DIAGNOSIS — Z79899 Other long term (current) drug therapy: Secondary | ICD-10-CM | POA: Diagnosis not present

## 2018-11-06 DIAGNOSIS — M5116 Intervertebral disc disorders with radiculopathy, lumbar region: Secondary | ICD-10-CM | POA: Diagnosis present

## 2018-11-06 DIAGNOSIS — M4726 Other spondylosis with radiculopathy, lumbar region: Secondary | ICD-10-CM | POA: Diagnosis not present

## 2018-11-06 DIAGNOSIS — Z8261 Family history of arthritis: Secondary | ICD-10-CM | POA: Diagnosis not present

## 2018-11-06 DIAGNOSIS — G4733 Obstructive sleep apnea (adult) (pediatric): Secondary | ICD-10-CM | POA: Diagnosis present

## 2018-11-06 DIAGNOSIS — M4807 Spinal stenosis, lumbosacral region: Secondary | ICD-10-CM | POA: Diagnosis present

## 2018-11-06 DIAGNOSIS — T84498A Other mechanical complication of other internal orthopedic devices, implants and grafts, initial encounter: Secondary | ICD-10-CM

## 2018-11-06 DIAGNOSIS — K529 Noninfective gastroenteritis and colitis, unspecified: Secondary | ICD-10-CM | POA: Diagnosis present

## 2018-11-06 DIAGNOSIS — T84226A Displacement of internal fixation device of vertebrae, initial encounter: Secondary | ICD-10-CM | POA: Diagnosis present

## 2018-11-06 DIAGNOSIS — I10 Essential (primary) hypertension: Secondary | ICD-10-CM | POA: Diagnosis present

## 2018-11-06 DIAGNOSIS — Z9089 Acquired absence of other organs: Secondary | ICD-10-CM | POA: Diagnosis not present

## 2018-11-06 DIAGNOSIS — M8938 Hypertrophy of bone, other site: Secondary | ICD-10-CM | POA: Diagnosis present

## 2018-11-06 DIAGNOSIS — Z833 Family history of diabetes mellitus: Secondary | ICD-10-CM | POA: Diagnosis not present

## 2018-11-06 DIAGNOSIS — Z923 Personal history of irradiation: Secondary | ICD-10-CM | POA: Diagnosis not present

## 2018-11-06 DIAGNOSIS — Z981 Arthrodesis status: Secondary | ICD-10-CM

## 2018-11-06 DIAGNOSIS — Z791 Long term (current) use of non-steroidal anti-inflammatories (NSAID): Secondary | ICD-10-CM

## 2018-11-06 SURGERY — POSTERIOR LUMBAR FUSION 2 WITH HARDWARE REMOVAL
Anesthesia: General | Site: Back

## 2018-11-06 MED ORDER — AMLODIPINE BESYLATE 10 MG PO TABS
10.0000 mg | ORAL_TABLET | Freq: Every day | ORAL | Status: DC
  Administered 2018-11-06 – 2018-11-09 (×4): 10 mg
  Filled 2018-11-06 (×4): qty 1

## 2018-11-06 MED ORDER — HYDROMORPHONE HCL 1 MG/ML IJ SOLN
0.2500 mg | INTRAMUSCULAR | Status: DC | PRN
  Administered 2018-11-06: 0.5 mg via INTRAVENOUS

## 2018-11-06 MED ORDER — PROPOFOL 10 MG/ML IV BOLUS
INTRAVENOUS | Status: AC
  Filled 2018-11-06: qty 40

## 2018-11-06 MED ORDER — CHLORHEXIDINE GLUCONATE 4 % EX LIQD: 60.0000 mL | Freq: Once | CUTANEOUS | Status: DC

## 2018-11-06 MED ORDER — 0.9 % SODIUM CHLORIDE (POUR BTL) OPTIME
TOPICAL | Status: DC | PRN
  Administered 2018-11-06: 1000 mL

## 2018-11-06 MED ORDER — PANTOPRAZOLE SODIUM 40 MG PO TBEC
40.0000 mg | DELAYED_RELEASE_TABLET | Freq: Every day | ORAL | Status: DC
  Administered 2018-11-07: 40 mg via ORAL
  Filled 2018-11-06: qty 1

## 2018-11-06 MED ORDER — SODIUM CHLORIDE 0.9% FLUSH: 3.0000 mL | INTRAVENOUS | Status: DC | PRN

## 2018-11-06 MED ORDER — THROMBIN 20000 UNITS EX SOLR
CUTANEOUS | Status: DC | PRN
  Administered 2018-11-06: 20 mL via TOPICAL

## 2018-11-06 MED ORDER — LACTATED RINGERS IV SOLN
INTRAVENOUS | Status: DC | PRN
  Administered 2018-11-06 (×2): via INTRAVENOUS

## 2018-11-06 MED ORDER — HYDROCORTISONE 1 % EX CREA
1.0000 "application " | TOPICAL_CREAM | Freq: Two times a day (BID) | CUTANEOUS | Status: DC | PRN
  Filled 2018-11-06: qty 28

## 2018-11-06 MED ORDER — TRANEXAMIC ACID-NACL 1000-0.7 MG/100ML-% IV SOLN
INTRAVENOUS | Status: AC
  Filled 2018-11-06: qty 100

## 2018-11-06 MED ORDER — ROCURONIUM BROMIDE 50 MG/5ML IV SOSY
PREFILLED_SYRINGE | INTRAVENOUS | Status: DC | PRN
  Administered 2018-11-06: 50 mg via INTRAVENOUS
  Administered 2018-11-06: 30 mg via INTRAVENOUS

## 2018-11-06 MED ORDER — SODIUM CHLORIDE 0.9 % IV SOLN
INTRAVENOUS | Status: DC | PRN
  Administered 2018-11-06: 25 ug/min via INTRAVENOUS

## 2018-11-06 MED ORDER — VITAMIN D (ERGOCALCIFEROL) 1.25 MG (50000 UNIT) PO CAPS
50000.0000 [IU] | ORAL_CAPSULE | ORAL | Status: DC
  Filled 2018-11-06: qty 1

## 2018-11-06 MED ORDER — SODIUM CHLORIDE 0.9 % IV SOLN
250.0000 mL | INTRAVENOUS | Status: DC
  Administered 2018-11-06: 250 mL via INTRAVENOUS

## 2018-11-06 MED ORDER — MIDAZOLAM HCL 5 MG/5ML IJ SOLN
INTRAMUSCULAR | Status: DC | PRN
  Administered 2018-11-06: 2 mg via INTRAVENOUS

## 2018-11-06 MED ORDER — PHENYLEPHRINE 40 MCG/ML (10ML) SYRINGE FOR IV PUSH (FOR BLOOD PRESSURE SUPPORT)
PREFILLED_SYRINGE | INTRAVENOUS | Status: AC
  Filled 2018-11-06: qty 10

## 2018-11-06 MED ORDER — SODIUM CHLORIDE (PF) 0.9 % IJ SOLN
INTRAMUSCULAR | Status: AC
  Filled 2018-11-06: qty 10

## 2018-11-06 MED ORDER — LIDOCAINE 2% (20 MG/ML) 5 ML SYRINGE
INTRAMUSCULAR | Status: DC | PRN
  Administered 2018-11-06: 60 mg via INTRAVENOUS
  Administered 2018-11-06: 40 mg via INTRAVENOUS

## 2018-11-06 MED ORDER — CEFAZOLIN SODIUM-DEXTROSE 2-4 GM/100ML-% IV SOLN
2.0000 g | Freq: Three times a day (TID) | INTRAVENOUS | Status: AC
  Administered 2018-11-06 – 2018-11-07 (×2): 2 g via INTRAVENOUS
  Filled 2018-11-06 (×2): qty 100

## 2018-11-06 MED ORDER — LACTATED RINGERS IV SOLN
INTRAVENOUS | Status: DC | PRN
  Administered 2018-11-06 (×2): via INTRAVENOUS

## 2018-11-06 MED ORDER — HEMOSTATIC AGENTS (NO CHARGE) OPTIME
TOPICAL | Status: DC | PRN
  Administered 2018-11-06: 1 via TOPICAL

## 2018-11-06 MED ORDER — SUMATRIPTAN SUCCINATE 50 MG PO TABS
50.0000 mg | ORAL_TABLET | ORAL | Status: DC | PRN
  Filled 2018-11-06: qty 1

## 2018-11-06 MED ORDER — ACETAMINOPHEN 650 MG RE SUPP: 650.0000 mg | RECTAL | Status: DC | PRN

## 2018-11-06 MED ORDER — PROPOFOL 10 MG/ML IV BOLUS
INTRAVENOUS | Status: DC | PRN
  Administered 2018-11-06: 150 mg via INTRAVENOUS
  Administered 2018-11-06: 20 mg via INTRAVENOUS

## 2018-11-06 MED ORDER — ACETAMINOPHEN 325 MG PO TABS
650.0000 mg | ORAL_TABLET | ORAL | Status: DC | PRN
  Administered 2018-11-07: 650 mg via ORAL
  Filled 2018-11-06 (×3): qty 2

## 2018-11-06 MED ORDER — BISACODYL 5 MG PO TBEC: 5.0000 mg | DELAYED_RELEASE_TABLET | Freq: Every day | ORAL | Status: DC | PRN

## 2018-11-06 MED ORDER — ALUM & MAG HYDROXIDE-SIMETH 200-200-20 MG/5ML PO SUSP
30.0000 mL | Freq: Four times a day (QID) | ORAL | Status: DC | PRN
  Administered 2018-11-08: 30 mL via ORAL
  Filled 2018-11-06: qty 30

## 2018-11-06 MED ORDER — ROCURONIUM BROMIDE 10 MG/ML (PF) SYRINGE
PREFILLED_SYRINGE | INTRAVENOUS | Status: AC
  Filled 2018-11-06: qty 10

## 2018-11-06 MED ORDER — BUPIVACAINE LIPOSOME 1.3 % IJ SUSP
20.0000 mL | INTRAMUSCULAR | Status: AC
  Administered 2018-11-06: 20 mL
  Filled 2018-11-06: qty 20

## 2018-11-06 MED ORDER — FOLIC ACID 1 MG PO TABS
500.0000 ug | ORAL_TABLET | Freq: Every day | ORAL | Status: DC
  Administered 2018-11-07 – 2018-11-09 (×3): 0.5 mg
  Filled 2018-11-06 (×3): qty 1

## 2018-11-06 MED ORDER — FERROUS SULFATE 325 (65 FE) MG PO TABS
325.0000 mg | ORAL_TABLET | Freq: Every day | ORAL | Status: DC
  Administered 2018-11-07: 325 mg via ORAL
  Filled 2018-11-06: qty 1

## 2018-11-06 MED ORDER — HYDROMORPHONE HCL 1 MG/ML IJ SOLN
INTRAMUSCULAR | Status: AC
  Filled 2018-11-06: qty 1

## 2018-11-06 MED ORDER — MIDAZOLAM HCL 2 MG/2ML IJ SOLN
INTRAMUSCULAR | Status: AC
  Filled 2018-11-06: qty 2

## 2018-11-06 MED ORDER — EPHEDRINE SULFATE 50 MG/ML IJ SOLN
INTRAMUSCULAR | Status: DC | PRN
  Administered 2018-11-06: 5 mg via INTRAVENOUS
  Administered 2018-11-06 (×2): 10 mg via INTRAVENOUS

## 2018-11-06 MED ORDER — THROMBIN (RECOMBINANT) 20000 UNITS EX SOLR
CUTANEOUS | Status: AC
  Filled 2018-11-06: qty 20000

## 2018-11-06 MED ORDER — GABAPENTIN 300 MG PO CAPS
300.0000 mg | ORAL_CAPSULE | Freq: Three times a day (TID) | ORAL | Status: DC
  Administered 2018-11-06 – 2018-11-09 (×8): 300 mg via ORAL
  Filled 2018-11-06 (×8): qty 1

## 2018-11-06 MED ORDER — EPHEDRINE 5 MG/ML INJ
INTRAVENOUS | Status: AC
  Filled 2018-11-06: qty 10

## 2018-11-06 MED ORDER — FENTANYL CITRATE (PF) 100 MCG/2ML IJ SOLN
INTRAMUSCULAR | Status: DC | PRN
  Administered 2018-11-06: 100 ug via INTRAVENOUS
  Administered 2018-11-06 (×3): 50 ug via INTRAVENOUS

## 2018-11-06 MED ORDER — SODIUM CHLORIDE 0.9% FLUSH
3.0000 mL | Freq: Two times a day (BID) | INTRAVENOUS | Status: DC
  Administered 2018-11-06 – 2018-11-08 (×4): 3 mL via INTRAVENOUS

## 2018-11-06 MED ORDER — CEFAZOLIN SODIUM-DEXTROSE 2-4 GM/100ML-% IV SOLN
2.0000 g | INTRAVENOUS | Status: AC
  Administered 2018-11-06 (×2): 2 g via INTRAVENOUS
  Filled 2018-11-06: qty 100

## 2018-11-06 MED ORDER — PHENOL 1.4 % MT LIQD: 1.0000 | OROMUCOSAL | Status: DC | PRN

## 2018-11-06 MED ORDER — ACETAMINOPHEN 500 MG PO TABS
1000.0000 mg | ORAL_TABLET | Freq: Once | ORAL | Status: AC
  Administered 2018-11-06: 1000 mg via ORAL
  Filled 2018-11-06: qty 2

## 2018-11-06 MED ORDER — ONDANSETRON HCL 4 MG PO TABS: 4.0000 mg | ORAL_TABLET | Freq: Four times a day (QID) | ORAL | Status: DC | PRN

## 2018-11-06 MED ORDER — FENTANYL CITRATE (PF) 250 MCG/5ML IJ SOLN
INTRAMUSCULAR | Status: AC
  Filled 2018-11-06: qty 5

## 2018-11-06 MED ORDER — ALBUMIN HUMAN 5 % IV SOLN
INTRAVENOUS | Status: DC | PRN
  Administered 2018-11-06 (×2): via INTRAVENOUS

## 2018-11-06 MED ORDER — DIPHENOXYLATE-ATROPINE 2.5-0.025 MG PO TABS
2.0000 | ORAL_TABLET | Freq: Two times a day (BID) | ORAL | Status: DC
  Administered 2018-11-06 – 2018-11-09 (×6): 2 via ORAL
  Filled 2018-11-06 (×6): qty 2

## 2018-11-06 MED ORDER — ONDANSETRON HCL 4 MG/2ML IJ SOLN
INTRAMUSCULAR | Status: DC | PRN
  Administered 2018-11-06: 4 mg via INTRAVENOUS

## 2018-11-06 MED ORDER — STERILE WATER FOR IRRIGATION IR SOLN
Status: DC | PRN
  Administered 2018-11-06: 1000 mL

## 2018-11-06 MED ORDER — SUGAMMADEX SODIUM 200 MG/2ML IV SOLN
INTRAVENOUS | Status: DC | PRN
  Administered 2018-11-06: 200 mg via INTRAVENOUS

## 2018-11-06 MED ORDER — POTASSIUM 99 MG PO TABS: 99.0000 mg | ORAL_TABLET | Freq: Every day | ORAL | Status: DC

## 2018-11-06 MED ORDER — BACLOFEN 5 MG HALF TABLET
10.0000 mg | ORAL_TABLET | Freq: Two times a day (BID) | ORAL | Status: DC
  Administered 2018-11-06 – 2018-11-09 (×6): 10 mg
  Filled 2018-11-06 (×6): qty 2

## 2018-11-06 MED ORDER — METHOCARBAMOL 1000 MG/10ML IJ SOLN
500.0000 mg | Freq: Four times a day (QID) | INTRAVENOUS | Status: DC | PRN
  Filled 2018-11-06: qty 5

## 2018-11-06 MED ORDER — SUCCINYLCHOLINE CHLORIDE 200 MG/10ML IV SOSY
PREFILLED_SYRINGE | INTRAVENOUS | Status: AC
  Filled 2018-11-06: qty 10

## 2018-11-06 MED ORDER — DEXAMETHASONE SODIUM PHOSPHATE 10 MG/ML IJ SOLN
INTRAMUSCULAR | Status: DC | PRN
  Administered 2018-11-06: 10 mg via INTRAVENOUS

## 2018-11-06 MED ORDER — BUPIVACAINE HCL (PF) 0.5 % IJ SOLN
INTRAMUSCULAR | Status: AC
  Filled 2018-11-06: qty 30

## 2018-11-06 MED ORDER — ONDANSETRON HCL 4 MG/2ML IJ SOLN
INTRAMUSCULAR | Status: AC
  Filled 2018-11-06: qty 2

## 2018-11-06 MED ORDER — METHOCARBAMOL 500 MG PO TABS
500.0000 mg | ORAL_TABLET | Freq: Four times a day (QID) | ORAL | Status: DC | PRN
  Administered 2018-11-06 – 2018-11-08 (×4): 500 mg via ORAL
  Filled 2018-11-06 (×4): qty 1

## 2018-11-06 MED ORDER — LIDOCAINE 2% (20 MG/ML) 5 ML SYRINGE
INTRAMUSCULAR | Status: AC
  Filled 2018-11-06: qty 5

## 2018-11-06 MED ORDER — DEXAMETHASONE SODIUM PHOSPHATE 10 MG/ML IJ SOLN
INTRAMUSCULAR | Status: AC
  Filled 2018-11-06: qty 1

## 2018-11-06 MED ORDER — OXYCODONE HCL ER 15 MG PO T12A
15.0000 mg | EXTENDED_RELEASE_TABLET | Freq: Two times a day (BID) | ORAL | Status: DC
  Administered 2018-11-06 – 2018-11-09 (×6): 15 mg via ORAL
  Filled 2018-11-06 (×6): qty 1

## 2018-11-06 MED ORDER — CEFAZOLIN SODIUM 1 G IJ SOLR
INTRAMUSCULAR | Status: AC
  Filled 2018-11-06: qty 20

## 2018-11-06 MED ORDER — MAGIC MOUTHWASH W/LIDOCAINE
10.0000 mL | ORAL | Status: DC | PRN
  Filled 2018-11-06: qty 10

## 2018-11-06 MED ORDER — SUCCINYLCHOLINE CHLORIDE 20 MG/ML IJ SOLN
INTRAMUSCULAR | Status: DC | PRN
  Administered 2018-11-06: 100 mg via INTRAVENOUS

## 2018-11-06 MED ORDER — ASPIRIN 81 MG PO CHEW
81.0000 mg | CHEWABLE_TABLET | Freq: Every day | ORAL | Status: DC
  Administered 2018-11-06 – 2018-11-09 (×3): 81 mg
  Filled 2018-11-06 (×4): qty 1

## 2018-11-06 MED ORDER — SODIUM CHLORIDE 0.9 % IV SOLN
INTRAVENOUS | Status: DC
  Administered 2018-11-06 – 2018-11-08 (×2): via INTRAVENOUS

## 2018-11-06 MED ORDER — BUPIVACAINE HCL 0.5 % IJ SOLN
INTRAMUSCULAR | Status: DC | PRN
  Administered 2018-11-06: 30 mL

## 2018-11-06 MED ORDER — AMPHETAMINE-DEXTROAMPHETAMINE 10 MG PO TABS: 10.0000 mg | ORAL_TABLET | Freq: Three times a day (TID) | ORAL | Status: DC | PRN

## 2018-11-06 MED ORDER — LEVOCETIRIZINE DIHYDROCHLORIDE 5 MG PO TABS: 5.0000 mg | ORAL_TABLET | Freq: Every evening | ORAL | Status: DC

## 2018-11-06 MED ORDER — TRAMADOL HCL 50 MG PO TABS
100.0000 mg | ORAL_TABLET | Freq: Four times a day (QID) | ORAL | Status: DC | PRN
  Administered 2018-11-06 – 2018-11-08 (×4): 100 mg
  Filled 2018-11-06 (×4): qty 2

## 2018-11-06 MED ORDER — FLEET ENEMA 7-19 GM/118ML RE ENEM: 1.0000 | ENEMA | Freq: Once | RECTAL | Status: DC | PRN

## 2018-11-06 MED ORDER — MENTHOL 3 MG MT LOZG: 1.0000 | LOZENGE | OROMUCOSAL | Status: DC | PRN

## 2018-11-06 MED ORDER — POLYETHYLENE GLYCOL 3350 17 G PO PACK: 17.0000 g | PACK | Freq: Every day | ORAL | Status: DC | PRN

## 2018-11-06 MED ORDER — LORAZEPAM 1 MG PO TABS: 1.0000 mg | ORAL_TABLET | Freq: Four times a day (QID) | ORAL | Status: DC | PRN

## 2018-11-06 MED ORDER — ADULT MULTIVITAMIN W/MINERALS CH
1.0000 | ORAL_TABLET | Freq: Every day | ORAL | Status: DC
  Administered 2018-11-07 – 2018-11-09 (×3): 1
  Filled 2018-11-06 (×3): qty 1

## 2018-11-06 MED ORDER — LORATADINE 10 MG PO TABS
10.0000 mg | ORAL_TABLET | Freq: Every evening | ORAL | Status: DC
  Administered 2018-11-06: 10 mg via ORAL
  Filled 2018-11-06: qty 1

## 2018-11-06 MED ORDER — ONDANSETRON HCL 4 MG/2ML IJ SOLN: 4.0000 mg | Freq: Four times a day (QID) | INTRAMUSCULAR | Status: DC | PRN

## 2018-11-06 MED ORDER — DOCUSATE SODIUM 100 MG PO CAPS
100.0000 mg | ORAL_CAPSULE | Freq: Two times a day (BID) | ORAL | Status: DC
  Administered 2018-11-07: 100 mg via ORAL
  Filled 2018-11-06 (×2): qty 1

## 2018-11-06 MED ORDER — PANTOPRAZOLE SODIUM 40 MG IV SOLR: 40.0000 mg | Freq: Every day | INTRAVENOUS | Status: DC

## 2018-11-06 SURGICAL SUPPLY — 76 items
ADH SKN CLS APL DERMABOND .7 (GAUZE/BANDAGES/DRESSINGS) ×1
BLADE CLIPPER SURG (BLADE) IMPLANT
BONE VIVIGEN FORMABLE 5.4CC (Bone Implant) ×2 IMPLANT
BUR MATCHSTICK NEURO 3.0 LAGG (BURR) ×2 IMPLANT
CONNECTOR EXPEDIUM SFX SZ A6 (Orthopedic Implant) ×1 IMPLANT
COVER BACK TABLE 80X110 HD (DRAPES) ×2 IMPLANT
COVER MAYO STAND STRL (DRAPES) ×4 IMPLANT
COVER SURGICAL LIGHT HANDLE (MISCELLANEOUS) ×2 IMPLANT
COVER WAND RF STERILE (DRAPES) ×1 IMPLANT
DECANTER SPIKE VIAL GLASS SM (MISCELLANEOUS) ×2 IMPLANT
DERMABOND ADVANCED (GAUZE/BANDAGES/DRESSINGS) ×1
DERMABOND ADVANCED .7 DNX12 (GAUZE/BANDAGES/DRESSINGS) ×1 IMPLANT
DRAPE C-ARM 42X72 X-RAY (DRAPES) ×3 IMPLANT
DRAPE C-ARMOR (DRAPES) ×1 IMPLANT
DRAPE MICROSCOPE LEICA (MISCELLANEOUS) ×1 IMPLANT
DRAPE SURG 17X23 STRL (DRAPES) ×8 IMPLANT
DRSG MEPILEX BORDER 4X12 (GAUZE/BANDAGES/DRESSINGS) ×1 IMPLANT
DURAPREP 26ML APPLICATOR (WOUND CARE) ×2 IMPLANT
ELECT BLADE 4.0 EZ CLEAN MEGAD (MISCELLANEOUS) ×2
ELECT CAUTERY BLADE 6.4 (BLADE) ×2 IMPLANT
ELECT REM PT RETURN 9FT ADLT (ELECTROSURGICAL) ×2
ELECTRODE BLDE 4.0 EZ CLN MEGD (MISCELLANEOUS) IMPLANT
ELECTRODE REM PT RTRN 9FT ADLT (ELECTROSURGICAL) ×1 IMPLANT
EVACUATOR 1/8 PVC DRAIN (DRAIN) ×1 IMPLANT
GAUZE SPONGE 4X4 12PLY STRL (GAUZE/BANDAGES/DRESSINGS) ×1 IMPLANT
GLOVE BIO SURGEON STRL SZ 6.5 (GLOVE) ×3 IMPLANT
GLOVE BIOGEL PI IND STRL 6.5 (GLOVE) IMPLANT
GLOVE BIOGEL PI IND STRL 8 (GLOVE) ×1 IMPLANT
GLOVE BIOGEL PI INDICATOR 6.5 (GLOVE) ×3
GLOVE BIOGEL PI INDICATOR 8 (GLOVE) ×1
GLOVE ECLIPSE 9.0 STRL (GLOVE) ×2 IMPLANT
GLOVE ORTHO TXT STRL SZ7.5 (GLOVE) ×2 IMPLANT
GLOVE SURG 8.5 LATEX PF (GLOVE) ×2 IMPLANT
GOWN STRL REUS W/ TWL LRG LVL3 (GOWN DISPOSABLE) ×1 IMPLANT
GOWN STRL REUS W/TWL 2XL LVL3 (GOWN DISPOSABLE) ×4 IMPLANT
GOWN STRL REUS W/TWL LRG LVL3 (GOWN DISPOSABLE) ×2
GRAFT BNE MATRIX VG FRMBL MD 5 (Bone Implant) IMPLANT
KIT BASIN OR (CUSTOM PROCEDURE TRAY) ×2 IMPLANT
KIT POSITION SURG JACKSON T1 (MISCELLANEOUS) ×2 IMPLANT
KIT TURNOVER KIT B (KITS) ×2 IMPLANT
NDL SPNL 18GX3.5 QUINCKE PK (NEEDLE) ×1 IMPLANT
NEEDLE 22X1 1/2 (OR ONLY) (NEEDLE) ×2 IMPLANT
NEEDLE RANFAC BLUNT 8X15 (NEEDLE) IMPLANT
NEEDLE SPNL 18GX3.5 QUINCKE PK (NEEDLE) IMPLANT
NS IRRIG 1000ML POUR BTL (IV SOLUTION) ×2 IMPLANT
PACK LAMINECTOMY ORTHO (CUSTOM PROCEDURE TRAY) ×2 IMPLANT
PAD ARMBOARD 7.5X6 YLW CONV (MISCELLANEOUS) ×4 IMPLANT
PATTIES SURGICAL .75X.75 (GAUZE/BANDAGES/DRESSINGS) ×2 IMPLANT
ROD EXPEDIUM PRE BENT 5.5X75 (Rod) ×1 IMPLANT
ROD EXPEDIUM PRE BENT 55MM (Rod) ×1 IMPLANT
SCREW CORT FIX FEN 5.5X7X35MM (Screw) ×2 IMPLANT
SCREW SET SINGLE INNER (Screw) ×8 IMPLANT
SPACER LORD CP 9X8X23 5D (Spacer) ×1 IMPLANT
SPONGE LAP 4X18 RFD (DISPOSABLE) ×2 IMPLANT
SPONGE SURGIFOAM ABS GEL 100 (HEMOSTASIS) IMPLANT
STAPLER VISISTAT 35W (STAPLE) ×1 IMPLANT
SUT VIC AB 0 CT1 27 (SUTURE) ×2
SUT VIC AB 0 CT1 27XBRD ANBCTR (SUTURE) ×1 IMPLANT
SUT VIC AB 0 CTX 36 (SUTURE) ×2
SUT VIC AB 0 CTX36XBRD ANTBCTR (SUTURE) IMPLANT
SUT VIC AB 1 CTX 27 (SUTURE) ×1 IMPLANT
SUT VIC AB 1 CTX 36 (SUTURE) ×4
SUT VIC AB 1 CTX36XBRD ANBCTR (SUTURE) ×2 IMPLANT
SUT VIC AB 2-0 CT1 27 (SUTURE) ×2
SUT VIC AB 2-0 CT1 TAPERPNT 27 (SUTURE) ×1 IMPLANT
SUT VIC AB 3-0 X1 27 (SUTURE) ×2 IMPLANT
SYR 20ML LL LF (SYRINGE) ×2 IMPLANT
TAP CANN VIPER2 DL 5.0 (TAP) ×1 IMPLANT
TAP CANN VIPER2 DL 6.0 (TAP) ×1 IMPLANT
TAP CANN VIPER2 DL 7.0 (TAP) ×1 IMPLANT
TAP VIPER MIS 4.35MM (TAP) ×1 IMPLANT
TOWEL GREEN STERILE (TOWEL DISPOSABLE) ×2 IMPLANT
TOWEL GREEN STERILE FF (TOWEL DISPOSABLE) ×2 IMPLANT
TRAY FOLEY MTR SLVR 14FR STAT (SET/KITS/TRAYS/PACK) ×1 IMPLANT
WATER STERILE IRR 1000ML POUR (IV SOLUTION) ×2 IMPLANT
YANKAUER SUCT BULB TIP NO VENT (SUCTIONS) ×2 IMPLANT

## 2018-11-06 NOTE — Transfer of Care (Signed)
Immediate Anesthesia Transfer of Care Note  Patient: Edwyna Ready  Procedure(s) Performed: Explore fusion Lumbar three-four probable transforaminal lumbar interbody fusion with revision of lumbar three bilateral pedicle screws, local bone graft and Vivigen, Extension of fusion to Lumbar five-sacral one with rods, screws and cages (N/A Back)  Patient Location: PACU  Anesthesia Type:General  Level of Consciousness: awake, alert , oriented and sedated  Airway & Oxygen Therapy: Patient Spontanous Breathing and Patient connected to nasal cannula oxygen  Post-op Assessment: Report given to RN, Post -op Vital signs reviewed and stable and Patient moving all extremities  Post vital signs: Reviewed and stable  Last Vitals:  Vitals Value Taken Time  BP 125/85 11/06/18 1415  Temp    Pulse 84 11/06/18 1420  Resp 17 11/06/18 1420  SpO2 100 % 11/06/18 1420  Vitals shown include unvalidated device data.  Last Pain:  Vitals:   11/06/18 0614  TempSrc: Oral  PainSc: 5       Patients Stated Pain Goal: 3 (16/38/46 6599)  Complications: No apparent anesthesia complications

## 2018-11-06 NOTE — Op Note (Addendum)
11/06/2018  2:29 PM  PATIENT:  Kristen [name]  53 y.o. female  MRN: [id number]  OPERATIVE REPORT  PRE-OPERATIVE DIAGNOSIS:  loosening of hardware Lumbar three-four, probable pseudarthrosis L3-4 Transforaminal lumbar interbody fusion, severe Left Lumbar five sacral one foraminal narrowing with Degenerative Disc Disease  POST-OPERATIVE DIAGNOSIS:  Solid posterior fusion L3-4, left L4 and L5 foramenal stenosis, right mild foramenal stenosis L5-S1.  PROCEDURE:  Procedure(s): Explore fusion Lumbar three-four with removal of rods and caps to pedicle screws. Extension of fusion to Lumbar five-sacral one with rods, screws and cages    SURGEON:  Jessy Oto, MD     ASSISTANT:  Benjiman Core, PA-C  (Present throughout the entire procedure and necessary for completion of procedure in a timely manner)     ANESTHESIA:  General, supplemented with local marcaine 0.5% 1:1 exparel 1.3% total 40cc. Dr. Ola Spurr.    COMPLICATIONS:  None.   EBL: 600 CC  CELL SAVER BLOOD RETURNED: 250CC  DRAINS: Hemovac medium right lover lumbar spine. Foley to SD.    COMPONENTS  Implant Name Type Inv. Item Serial No. Manufacturer Lot No. LRB No. Used Action  BONE VIVIGEN FORMABLE 5.4CC - [id number] Bone Implant BONE VIVIGEN FORMABLE 5.4CC [id number] [employer]  N/A 1 Implanted  ROD EXPEDIUM PRE BENT 5.5X75 - [id number] Rod ROD EXPEDIUM PRE BENT 5.5X75  [employer]  N/A 2 Explanted  SCREW CORTICAL VIPER 7X35 - [id number] Screw SCREW CORTICAL VIPER 7X35  [employer]  N/A 1 Explanted  SCREW SET SINGLE INNER - [id number] Screw SCREW SET SINGLE INNER  [employer]  N/A 6 Explanted  SCREW CORT FIX FEN 5.5X7X35MM - [id number] Screw SCREW CORT FIX FEN 5.5X7X35MM  [employer]  N/A 2 Implanted  SCREW SET SINGLE INNER - [id number] Screw SCREW SET SINGLE INNER  [employer]  N/A 8 Implanted  Concorde ProTi 360 9x8x19m Cage   SYNTHES  SPINE [id number]/A 1 Implanted  ROD EXPEDIUM PRE BENT 55MM - [id number] ROD EXPEDIUM PRE BENT 55MM  [employer]  N/A 1 Implanted  ROD EXPEDIUM PRE BENT 5.5X75 - [id number] ROD EXPEDIUM PRE BENT 5.5X75  [employer]  N/A 1 Implanted  CONNECTOR EXPEDIUM SFX SZ A6 - [id number] Implant CONNECTOR EXPEDIUM SFX SZ A6  [employer]  N/A 1 Implanted  :   PROCEDURE: The patient was met in the holding area, and the appropriate lumbar levels right L3-4 and L5-S1 identified and marked with an "X" and my initials. I had discussion with the patient in the preop holding area regarding a change of consent form.The fusion levels are identified as L3-4, L4-5 and L5-S1. Patient understands the rationale to perform TLIF at L3-4 level if exploration of her fusion shows persistent motion after removal of hardware rods bilateral L3 to L5 and bilateral L3 pedicle screws and stressing of the fusion site and the right L5-S1 TLIF with partial hemilaminectomies left lateral recess and foramenal stenosis and to allow for decompression of the foramen at L5-S1 and extension of her fusion to the L5-S1 level with maintenance of the normal lordotic curve. The patient was then transported to OR and was placed under general anestheticwithout difficulty. The patient received appropriate preoperative antibiotic prophylaxis 2 gm Ancef.  Nursing staff inserted a Foley catheter under sterile conditions. The patient was then turned to a prone position using the JNewtonspine frame. PAS. all pressure points well  padded the arms at the side to 90 90. Standard prep with DuraPrep solution draped in the usual manner from the lower dorsal spine the mid sacral segment. Iodine Vi-Drape was used and the old incision scar was marked. Time-out procedure was called and correct. Skin in the midline between L2 and S2 was then infiltrated with local anesthesia, marcaine 1/2% 1:1 exparel 1.3% total 20 cc used.  Incision was then made  extending from L2-S2  through the skin and subcutaneous layers down to the patient's lumbodorsal fascia and spinous processes. The incision then carried sharply excising the supraspinous ligament and then continuing the lateral aspect of the spinous processes of L3, L4, L5 and S1. Cobb elevator used to carefully elevate the paralumbar muscles off of the posterior elements using electrocautery carefully drilled bleeding and perform dissection of the muscle tissues laterally and exposing the posterior hardware pedicle screws and rods L3 to L5. Continuing the exposure out laterally to expose the lateral margin of the facet joint line at L5-S1 bilaterally. Incision was carried in the midline down to the S1 level area bleeders controlled using electrocautery monopolar electrocautery.  Utilizing the hardware present the levels of the previous surgeries having been L3 to L5. The caps for the pedicle screw fasteners removed L3 to L5 and the rods then removed. The fusion at L3-4 was stressed and was found to be solid. There was abundant left posterior fusion mass at L3-4. CT scan has demonstrated L4-5 anterior interbody fusion. Attention turned to performing the left L4-5 and left L5-S1 partial hemilaminectomy and left L4 and L5 foramenotomies.  VIper retractor and headlight and loopes used for this portion of the case. The left L4-5 and left L5-S1 and right L5-S1 partial hemilaminectomies then performed. The left inferior lamina of L4 and L5 and right inferior lamina of  L5 and the inferior articular process 100% resected on the right at L5-S1 and the left L4 and L5 inferior articular processes resected my nearly 40%. Care take to preserve as much of the left lamina of L4 as possible as this was the area of solid L3-4 posterior fusion. Osteotomes 1/4" and kerrisons used to perform resection of the inferior articular processes and the interlaminar area then exposed the space nearly competely covered  with facet hypertrophic changes. The ligamentum flavum then thinned with kerrisons and rongeurs and pituitary rongeurs. A currette used to release the attachment of the ligamentum flavum to the superior lamina of L5 and S1 and the spinal canal enterred over the superior aspected of the lamina of L5 and S1. With partial removal of  The superior lamina of L5 and S1 with foraminotomy performed over the left L5 and S1 nerve roots and the medial border of the L5 and S1 pedicles defined and the medial superior articular process of L5 and S1 then osteotomized with 1/4" osteotome and kerrisons. The left medial superior articular process further resected until the underlying foramen was patent and a Woodson neuroprobe Was then able to passes out the left L4 and L5 neuroforamen demonstrating the decompression as complete. The right L5 inferior articular process was completely resected to the level of the pars and the right superior S1 lamina partially resected and foramenotomy performed over the right S1 nerve root and the right medial L5 superior articular process then resected over nearly 100 % to allow for mobilization of the right L5 nerve root and exposure of the right L5-S1 posteriorlateral disc for performing the right L5-S1 transforamenal interbody fusion. Epidural veins exposed  and bipolar cautery used to cauterize and these then divided to allow the right lateral thecal sac and right L5 nerve root to be mobilized.  C-arm fluoroscopy was then brought into the field and using C-arm fluoroscopy then a hole made into the mid lateral aspect of the pedicle of left S1 observed in the pedicle using C arm at the 9 oclock position on the left S1 pedicle nerve probe initial entry was determined on fluoroscopy to be good position alignment so that a 4.9m tap was passed to 35 mm within the left S1 pedicle to a depth of nearly 35 mm observed on C-arm fluoroscopy to be beyond the midpoint of the lumbar vertebra and then  position alignment within the left S1 pedicle this was then removed and the pedicle channel probed demonstrating patency no sign of rupture the cortex of the pedicle. Tapping with a 4.35 mm screw tap then 6 mm tap then 7.0 mm x 35 mm screw was placed on the left side at the S1 level. C-arm fluoroscopy was then brought into the field and using C-arm fluoroscopy then a hole made into the mid lateral aspect of the pedicle of right S1 level, observed in the pedicle using ball tipped nerve hook and hockey stick nerve probe initial entry was determined on fluoroscopy to be good position alignment so that 4.311mtap was then used to tap the right S1 pedicle to a depth of nearly 35 mm observed on C-arm fluoroscopy to be beyond the midpoint of the lumbar vertebra and then position alignment within the right S1 pedicle this was then removed and the pedicle channel probed demonstrating patency no sign of rupture the cortex of the pedicle. Tapping with a 4.35 mm screw tap then 6.0 mm tap and then a 7.29m47m 35 mm screw was chosen to be placed later following decompression and right TLIF level following decompression and TLIFs.   Attention then turned to placement of the transforaminal lumbar interbody fusion cages. The OR microscope draped sterilel and brought into the field. Using a Penfield 4 the right lateral aspect of the thecal sac at the L5-S1 disc space was carefully freed up The thecal sac could then easily be retracted in the posterior lateral aspect of the L5-S1 disc was exposed and a 15 blade scalpel used to incise the posterolateral disc and an osteotome used to resect a small portion of bone off the superior aspect of the posterior superior vertebral body of S1 in order to ease the entry into the L5-S1 disc space. A  2mm53mrrison rongeur was then able to be introduced in the disc space debrided it was quite narrow. 7 mm dilator was used to dialate the L4-5 disc space on the right side attempts were made to dilate  further the 8 mm and then 9mm 329me successful and using small curettes and the disc space was debrided a minimal degenerative disc present in the endplates debrided to bleeding endplate bone. Shavers were inserted to trial the intervertebral disc space. A 9 mm x 23mm 75mi cage was carefully packed with morcellized bone graft that had been harvested from previous the laminotomies and vivigen.The cage was then inserted with the insertion handle.  Additional local bone graft and vivigen was then packed into the intervertebral disc space. Bleeding controlled using bipolar electrocautery thrombin soaked gel cottonoids. The right L5-S1 level hemostasis obtained with bipolar electrocautery to control small bleeders present.The cage at L5-S1 was placed anteriorly as best as possible to maintain the  patient's lordosis. With this then the transforaminal lumbar interbody fusion portion of the case was completed bleeders were controlled using bipolar electrocautery thrombin-soaked Gelfoam were appropriate.Decortication of the facet joints carried out left L5-S1. These were packed with cancellous local bone graft. 2 pedicle screws on the left and right L4 and L5 already in place, the viper corticofixation screws on the right S1 placed and then each fastener carefully aligned  to allow for placement of rods. The right side first quarter inch titanium rod was then carefully contoured using the french benders and a precontoured 66m rod. This was then placed into the pedicle screws on the right extending from L4-L5-S1 each of the caps were carefully placed loosely tightened. Attention turned to the left side were similarly and then screws were carefully adjusted to allow for a better pattern screws to allow for placement of fixation of the rod a quarter inch 55 mm precontoured titanium rod was then carefully contoured. This was able to be inserted into the left pedicle screw fasteners, Caps onto the L4 fasteners were tightened to  80 foot lbs. Across the right side  L4-5 and L5-S1 screw fasteners compression was obtained on the right side between L4 and L5, then L5 andS1 by compressing between the fasteners and tightening the screw caps 85 pounds. Similarly this was done on the left side at L4-5 and L5-S1 obtaining compression and tightened 85 pounds. Irrigation was carried out with copious amounts of saline solution this was done throughout the case. Cell Saver was used during the case. A single cross-link for the Depuy expedium system was placed at the L4-5 level measured with the measuring tool and the appropriate A6 cross-link was then carefully contoured and applied to the rods and tightened again to 80 foot-pounds bilaterally using the appropriate torque screwdriver. Center screw on the cross-link was then carefully tightened 80 foot-pounds irrigation was carried out. Hockey stick neuroprobe was used to probe the neuroforamen left L4 and bilateral  L5 and S1, these were determined to be well decompressed. Permanent C-arm images were obtained in AP and lateral plane and oblique planes. Remaining local bone graft was then applied along both lateral posterior lateral region extending from L4 toS1 facet beds.Gelfoam was then removed spinal canal the lumbodorsal musculature carefully exam debrided of any devitalized tissue following removal of Viper retractors were the bleeders were controlled using electrocautery. A medium hemovac drain was placed over the posterior lumbar spine from L3 to L5. The area dorsal paralumbar muscle were then approximated in the midline with interrupted #1 Vicryl sutures loose the dorsal fascia was reattached to the spinous process of L3 to superiorly and S1 inferiorly this was done with #1 Vicryl sutures. Subcutaneous layers then approximated with stainless steel staples then MedPlex bandage. All instrument and sponge counts were correct. The patient was then returned to a supine position on her bed reactivated  extubated and returned to the recovery room in satisfactory condition.   JBenjiman Core PA-C perform the duties of assistant surgeon during this case. He was present from the beginning of the case to the end of the case assisting in transfer the patient from his stretcher to the OR table and back to the stretcher at the end of the case. Assisted in careful retraction and suction of the laminectomy site delicate neural structures operating under the operating room microscope. He performed closure of the incision from the fascia to the skin applying the dressing.   FINDINGS: Solid fusion posterior left L3-4 fusion  mass, solid interbody lumbar fusion L4-5, left L4 and L5 severe foramenal stenosis. Moderate right L5 foramenal stenosis.    Kristen [name]  11/06/2018, 2:29 PM

## 2018-11-06 NOTE — Brief Op Note (Signed)
11/06/2018  1:45 PM  PATIENT:  Kristen [name]  53 y.o. female  PRE-OPERATIVE DIAGNOSIS:  loosening of hardware Lumbar three-four, probable pseudarthrosis L3-4 Transforaminal lumbar interbody fusion, severe Left Lumbar five sacral one foraminal narrowing with Degenerative Disc Disease  POST-OPERATIVE DIAGNOSIS:  loosening of hardware Lumbar three-four, probable pseudarthrosis Lumbar three-four  PROCEDURE:  Procedure(s): Explore fusion Lumbar three-four probable transforaminal lumbar interbody fusion with revision of lumbar three bilateral pedicle screws, local bone graft and Vivigen, Extension of fusion to Lumbar five-sacral one with rods, screws and cages (N/A)  SURGEON:  Surgeon(s) and Role:    * Jessy Oto, MD - Primary  PHYSICIAN ASSISTANT: Benjiman Core, PA-C  ANESTHESIA:   local and general  EBL:  650 mL   BLOOD ADMINISTERED:250 CC CELLSAVER  DRAINS: (Medium) Hemovact drain(s) in the right lumbar spine with  Suction Clamped and Urinary Catheter (Foley)   LOCAL MEDICATIONS USED:  MARCAINE 0.5% 1:1 EXPAREL1.3%  Amount: 40 ml  SPECIMEN:  No Specimen  DISPOSITION OF SPECIMEN:  N/A  COUNTS:  YES  TOURNIQUET:  * No tourniquets in log *  DICTATION: .Dragon Dictation  PLAN OF CARE: Admit to inpatient   PATIENT DISPOSITION:  PACU - hemodynamically stable.   Delay start of Pharmacological VTE agent (>24hrs) due to surgical blood loss or risk of bleeding: yes

## 2018-11-06 NOTE — Plan of Care (Signed)

## 2018-11-06 NOTE — Progress Notes (Signed)
MEDICATION RELATED CONSULT NOTE - INITIAL   Pharmacy Consult for gastrostomy tube Indication: medication assessment  Allergies  Allergen Reactions  . Other Hives and Swelling    tomato  . Sulfur Hives and Swelling    Patient Measurements: Height: 5' 9"  (175.3 cm) Weight: 192 lb (87.1 kg) IBW/kg (Calculated) : 66.2   Vital Signs: Temp: 97.5 F (36.4 C) (10/19 1545) Temp Source: Oral (10/19 0614) BP: 127/92 (10/19 1545) Pulse Rate: 68 (10/19 1545) Intake/Output from previous day: No intake/output data recorded. Intake/Output from this shift: Total I/O In: 2970 [I.V.:2100; Blood:250; IV Piggyback:620] Out: 2650 [Urine:2000; Blood:650]  Labs: No results for input(s): WBC, HGB, HCT, PLT, APTT, CREATININE, LABCREA, CREATININE, CREAT24HRUR, MG, PHOS, ALBUMIN, PROT, ALBUMIN, AST, ALT, ALKPHOS, BILITOT, BILIDIR, IBILI in the last 72 hours. Estimated Creatinine Clearance: 95.8 mL/min (by C-G formula based on SCr of 0.7 mg/dL).   Microbiology: Recent Results (from the past 720 hour(s))  Surgical pcr screen     Status: None   Collection Time: 11/01/18  9:00 AM   Specimen: Nasal Mucosa; Nasal Swab  Result Value Ref Range Status   MRSA, PCR NEGATIVE NEGATIVE Final   Staphylococcus aureus NEGATIVE NEGATIVE Final    Comment: (NOTE) The Xpert SA Assay (FDA approved for NASAL specimens in patients 63 years of age and older), is one component of a comprehensive surveillance program. It is not intended to diagnose infection nor to guide or monitor treatment. Performed at [facility], [place] [address] [place], [place] [address]   Novel Coronavirus, NAA (Hosp order, Send-out to Ref Lab; TAT 18-24 hrs     Status: None   Collection Time: 11/02/18  1:46 PM   Specimen: Nasopharyngeal Swab; Respiratory  Result Value Ref Range Status   SARS-CoV-2, NAA NOT DETECTED NOT DETECTED Final    Comment: (NOTE) This nucleic acid amplification test was developed and its performance  characteristics determined by [employer]. Nucleic acid amplification tests include PCR and TMA. This test has not been FDA cleared or approved. This test has been authorized by FDA under an Emergency Use Authorization (EUA). This test is only authorized for the duration of time the declaration that circumstances exist justifying the authorization of the emergency use of in vitro diagnostic tests for detection of SARS-CoV-2 virus and/or diagnosis of COVID-19 infection under section 564(b)(1) of the Act, 21 U.S.C. 536UYQ-0(H) (1), unless the authorization is terminated or revoked sooner. When diagnostic testing is negative, the possibility of a false negative result should be considered in the context of a patient's recent exposures and the presence of clinical signs and symptoms consistent with COVID-19. An individual without symptoms of COVID- 19 and who is not shedding SARS-CoV-2 vi rus would expect to have a negative (not detected) result in this assay. Performed At: [facility] [address] [place], [place] [id number] Rush Farmer MD OV:[id number]    [place]  Final    Comment: Performed at [facility], [place] [address] [place],  [address]    Medications:  Medications Prior to Admission  Medication Sig Dispense Refill Last Dose  . amLODipine (NORVASC) 10 MG tablet TAKE 1 TABLET BY MOUTH EVERY DAY (Patient taking differently: Place 10 mg into feeding tube daily. ) 90 tablet 3 11/05/2018 at Unknown time  . aspirin 81 MG chewable tablet Place 81 mg into feeding tube daily.    11/02/2018 at Unknown time  . baclofen (LIORESAL) 10 MG tablet PLACE 1 TABLET INTO FEEDING TUBE THREE  TIMES DAILY (Patient taking differently: Place 10 mg into feeding tube 2 (two) times daily. PLACE 1 TABLET INTO FEEDING TUBE THREE TIMES DAILY) 90 tablet 6 11/05/2018 at Unknown time  . diclofenac (VOLTAREN) 75 MG EC tablet TAKE 1 TABLET BY MOUTH TWICE  DAILY WITH FOOD AS NEEDED FOR PAIN (Patient taking differently: 75 mg See admin instructions. Give 75 mg per tube up to three times daily as needed for pain) 180 tablet 3 11/02/2018 at Unknown time  . diphenoxylate-atropine (LOMOTIL) 2.5-0.025 MG tablet Take 2 tablets by mouth 2 (two) times daily. Or via tube (Patient taking differently: Place 2 tablets into feeding tube 2 (two) times daily. ) 120 tablet 3 11/05/2018 at Unknown time  . FERROUS SULFATE PO Place 325 mg into feeding tube daily with breakfast.    11/02/2018 at Unknown time  . folic acid (FOLVITE) 242 MCG tablet Place 400 mcg into feeding tube daily.   11/02/2018 at Unknown time  . gabapentin (NEURONTIN) 300 MG capsule Take 1 capsule (300 mg total) by mouth 3 (three) times daily. (Patient taking differently: Place 300 mg into feeding tube 3 (three) times daily. ) 270 capsule 0 11/05/2018 at Unknown time  . magic mouthwash w/lidocaine SOLN Use 2 teaspoons swish and spit every 4-6 hours prn (Patient taking differently: Take 10 mLs by mouth every 4 (four) hours as needed for mouth pain. ) 150 mL 0 Past Month at Unknown time  . meloxicam (MOBIC) 15 MG tablet TAKE 1 TABLET(15 MG) BY MOUTH DAILY (Patient taking differently: Place 15 mg into feeding tube daily. ) 30 tablet 2 10/26/2018  . Multiple Vitamin (MULTIVITAMIN WITH MINERALS) TABS tablet Place 1 tablet into feeding tube daily.   11/02/2018  . pantoprazole (PROTONIX) 40 MG tablet TAKE 1 TABLET BY MOUTH DAILY (Patient taking differently: 40 mg See admin instructions. Give 40 mg per tube twice daily) 90 tablet 3 11/05/2018 at Unknown time  . Potassium 99 MG TABS 1 tablet (99 mg total) by Gastrostomy Tube route daily. (Patient taking differently: Place 99 mg into feeding tube at bedtime. ) 330 tablet 1 11/02/2018  . traMADol (ULTRAM) 50 MG tablet Place 100 mg into feeding tube every 6 (six) hours as needed for moderate pain.   11/05/2018 at Unknown time  . amphetamine-dextroamphetamine (ADDERALL)  10 MG tablet 1-3 daily as needed for sleepiness (Patient taking differently: Place 10 mg into feeding tube 3 (three) times daily as needed (to stay awake). ) 90 tablet 0 More than a month at Unknown time  . hydrocortisone cream 1 % APPLY EXTERNALLY TO THE AFFECTED AREA TWICE DAILY (Patient taking differently: Apply 1 application topically 2 (two) times daily as needed for itching. ) 30 g 0 More than a month at Unknown time    Assessment: 53 yo female s/p spinal procedure 10/19 and noted  s/p gastrostomy placement on 10/15. Pharmacy consulted to assess PTA medications to determine what medication can be administered.  Her medication list is listed above. The following concerns were noted  Medication problems -diclofenac: can not be crushed -protonix: enteric tablet (can not be crushed)  Medication available as solution -gabapentin -potassium -ferrous sulfate -lomotil   Plan:  -Consider holding diclofenac. May be able to use ibuprofen but also note she is on Mobic PTA as well -Consider using gabapentin  ferrous sulfate and lomotil as solution  -Given the small amount of potassium (36m) taken PTA would not order a supplement now.   -Protonix is not available as solution as outpatient  so would consider using famotidine suspension at discharge (or utilize tablets which can be crushed)  Hildred Laser, PharmD Clinical Pharmacist **Pharmacist phone directory can now be found on [email or web address] ([email or web address]).  Listed under [place].

## 2018-11-06 NOTE — Interval H&P Note (Signed)
History and Physical Interval Note:  11/06/2018 2:11 PM  Kristen [name]  has presented today for surgery, with the diagnosis of loosening of hardware Lumbar three-four, probable pseudarthrosis L3-4 Transforaminal lumbar interbody fusion, severe Left Lumbar five sacral one foraminal narrowing with Degenerative Disc Disease.  The various methods of treatment have been discussed with the patient and family. After consideration of risks, benefits and other options for treatment, the patient has consented to  Procedure(s): Explore fusion Lumbar three-four probable transforaminal lumbar interbody fusion with revision of lumbar three bilateral pedicle screws, local bone graft and Vivigen, Extension of fusion to Lumbar five-sacral one with rods, screws and cages (N/A) as a surgical intervention.  The patient's history has been reviewed, patient examined, no change in status, stable for surgery.  I have reviewed the patient's chart and labs.  Questions were answered to the patient's satisfaction.     Basil Dess

## 2018-11-06 NOTE — Anesthesia Postprocedure Evaluation (Signed)
Anesthesia Post Note  Patient: Edwyna Ready  Procedure(s) Performed: Explore fusion Lumbar three-four probable transforaminal lumbar interbody fusion with revision of lumbar three bilateral pedicle screws, local bone graft and Vivigen, Extension of fusion to Lumbar five-sacral one with rods, screws and cages (N/A Back)     Patient location during evaluation: Other Anesthesia Type: General Level of consciousness: awake and alert Pain management: pain level controlled Vital Signs Assessment: post-procedure vital signs reviewed and stable Respiratory status: spontaneous breathing, nonlabored ventilation and respiratory function stable Cardiovascular status: blood pressure returned to baseline and stable Postop Assessment: no apparent nausea or vomiting Anesthetic complications: no    Last Vitals:  Vitals:   11/06/18 1615 11/06/18 1639  BP: 126/86 (!) 126/99  Pulse: 60 81  Resp: 12 18  Temp: (!) 36.4 C (!) 36.3 C  SpO2: 100% 100%    Last Pain:  Vitals:   11/06/18 1639  TempSrc:   PainSc: 10-Worst pain ever                 Emelda Kohlbeck,W. EDMOND

## 2018-11-06 NOTE — Progress Notes (Signed)
Orthopedic Tech Progress Note Patient Details:  SAUL DORSI Jul 09, 1965 [id number] Called to [facility] for a LSO THIGH EXTENTION , HIP HINGED WITH THIGH LACER Patient ID: KALINA MORABITO, female   DOB: [birth year], 53 y.o.   MRN: [id number]   Janit Pagan 11/06/2018, 6:00 PM

## 2018-11-06 NOTE — Discharge Instructions (Signed)

## 2018-11-06 NOTE — Anesthesia Procedure Notes (Signed)
Procedure Name: Intubation Date/Time: 11/06/2018 7:58 AM Performed by: Scheryl Darter, CRNA Pre-anesthesia Checklist: Patient identified, Emergency Drugs available, Suction available and Patient being monitored Patient Re-evaluated:Patient Re-evaluated prior to induction Oxygen Delivery Method: Circle System Utilized Preoxygenation: Pre-oxygenation with 100% oxygen Induction Type: IV induction Ventilation: Mask ventilation without difficulty Laryngoscope Size: Mac and 3 Grade View: Grade I Tube type: Oral Tube size: 7.5 mm Number of attempts: 1 Airway Equipment and Method: Stylet and Oral airway Placement Confirmation: ETT inserted through vocal cords under direct vision,  positive ETCO2 and breath sounds checked- equal and bilateral Secured at: 22 cm Tube secured with: Tape Dental Injury: Teeth and Oropharynx as per pre-operative assessment

## 2018-11-06 NOTE — H&P (Signed)
Kristen [name] is an 53 y.o. female.   Chief Complaint: back pain and LE radiculopathy HPI: 53 year old black female history of lumbar stenosis comes in for preop evaluation.  Symptoms unchanged from previous visit.  She is wanting to proceed with Explore fusion L3-4, probable transforaminal lumbar interbody fusion with revision of L3 bilateral pedicle screws, BMP and local bone graft and Vivigen, Extension of fusion to L5-S1 with rods, screws and cages, Vivigen, local bone graft, allograft bone graft and BMP.  We have received preop medical clearance.   Past Medical History:  Diagnosis Date  . Allergy   . Anemia    Iron deficiency  . Anxiety   . Arthritis    back, left shoulder  . Blood transfusion without reported diagnosis   . Cancer (HCC)    tongue cancer  . Chicken pox   . Depression   . Diarrhea    takes Imodium daily  . GERD (gastroesophageal reflux disease)   . H/O hiatal hernia   . Headache(784.0)   . History of kidney stones    1996ish  . History of radiation therapy 11/16/16- 01/01/17   Base of Tongue/ 66 gy in 33 fractions/ Dose: 2 Gy  . Hypertension   . Migraine    none for 5 years (as of 01/13/16)  . OSA (obstructive sleep apnea) 10/13/2015   unable to get cpap, plans to get one in 2018  . Pneumonia   . Restless legs   . Scoliosis   . Shortness of breath    with exertion  . Sleep apnea     Past Surgical History:  Procedure Laterality Date  . BACK SURGERY  07/21/1978  . CHOLECYSTECTOMY N/A 08/15/2013   Procedure: LAPAROSCOPIC CHOLECYSTECTOMY WITH INTRAOPERATIVE CHOLANGIOGRAM;  Surgeon: Gwenyth Ober, MD;  Location: [place];  Service: General;  Laterality: N/A;  . COLONOSCOPY N/A 01/09/2013   Procedure: COLONOSCOPY;  Surgeon: Beryle Beams, MD;  Location: [place];  Service: Endoscopy;  Laterality: N/A;  . DIRECT LARYNGOSCOPY  07/2016   Dr. Nicolette Bang [facility]  . GASTROSTOMY TUBE PLACEMENT  09/27/2016  . HERNIA REPAIR Left 1981  . IR PATIENT EVAL TECH 0-60  MINS  12/13/2016  . IR PATIENT EVAL TECH 0-60 MINS  04/11/2017  . IR PATIENT EVAL TECH 0-60 MINS  03/24/2018  . IR REPLACE G-TUBE SIMPLE WO FLUORO  12/12/2017  . IR REPLACE G-TUBE SIMPLE WO FLUORO  03/29/2018  . IR REPLACE G-TUBE SIMPLE WO FLUORO  06/29/2018  . IR REPLACE G-TUBE SIMPLE WO FLUORO  11/02/2018  . MODIFIED RADICAL NECK DISSECTION Left 09/27/2016   Levels 1 & 2  . PARTIAL GLOSSECTOMY Left 09/27/2016   Left hemi partial glossectomy  . SPINE SURGERY  01/20/2016   fusion  . TONSILLECTOMY    . tracheotomy  09/27/2016  . TUBAL LIGATION  06/1988    Family History  Problem Relation Age of Onset  . Heart disease Father   . Heart attack Father   . Hypertension Mother   . Arthritis Mother   . Diabetes Maternal Grandmother   . Diabetes Paternal Grandmother   . Diabetes Maternal Uncle   . Colon polyps Neg Hx   . Colon cancer Neg Hx   . Esophageal cancer Neg Hx   . Stomach cancer Neg Hx   . Rectal cancer Neg Hx    Social History:  reports that she has never smoked. She has never used smokeless tobacco. She reports current alcohol use of about 7.0 standard drinks  of alcohol per week. She reports that she does not use drugs.  Allergies:  Allergies  Allergen Reactions  . Other Hives and Swelling    tomato  . Sulfur Hives and Swelling    Medications Prior to Admission  Medication Sig Dispense Refill  . amLODipine (NORVASC) 10 MG tablet TAKE 1 TABLET BY MOUTH EVERY DAY (Patient taking differently: Place 10 mg into feeding tube daily. ) 90 tablet 3  . aspirin 81 MG chewable tablet Place 81 mg into feeding tube daily.     . baclofen (LIORESAL) 10 MG tablet PLACE 1 TABLET INTO FEEDING TUBE THREE TIMES DAILY (Patient taking differently: Place 10 mg into feeding tube 2 (two) times daily. PLACE 1 TABLET INTO FEEDING TUBE THREE TIMES DAILY) 90 tablet 6  . diclofenac (VOLTAREN) 75 MG EC tablet TAKE 1 TABLET BY MOUTH TWICE DAILY WITH FOOD AS NEEDED FOR PAIN (Patient taking differently: 75  mg See admin instructions. Give 75 mg per tube up to three times daily as needed for pain) 180 tablet 3  . diphenoxylate-atropine (LOMOTIL) 2.5-0.025 MG tablet Take 2 tablets by mouth 2 (two) times daily. Or via tube (Patient taking differently: Place 2 tablets into feeding tube 2 (two) times daily. ) 120 tablet 3  . FERROUS SULFATE PO Place 325 mg into feeding tube daily with breakfast.     . folic acid (FOLVITE) 353 MCG tablet Place 400 mcg into feeding tube daily.    Marland Kitchen gabapentin (NEURONTIN) 300 MG capsule Take 1 capsule (300 mg total) by mouth 3 (three) times daily. (Patient taking differently: Place 300 mg into feeding tube 3 (three) times daily. ) 270 capsule 0  . magic mouthwash w/lidocaine SOLN Use 2 teaspoons swish and spit every 4-6 hours prn (Patient taking differently: Take 10 mLs by mouth every 4 (four) hours as needed for mouth pain. ) 150 mL 0  . meloxicam (MOBIC) 15 MG tablet TAKE 1 TABLET(15 MG) BY MOUTH DAILY (Patient taking differently: Place 15 mg into feeding tube daily. ) 30 tablet 2  . Multiple Vitamin (MULTIVITAMIN WITH MINERALS) TABS tablet Place 1 tablet into feeding tube daily.    . pantoprazole (PROTONIX) 40 MG tablet TAKE 1 TABLET BY MOUTH DAILY (Patient taking differently: 40 mg See admin instructions. Give 40 mg per tube twice daily) 90 tablet 3  . Potassium 99 MG TABS 1 tablet (99 mg total) by Gastrostomy Tube route daily. (Patient taking differently: Place 99 mg into feeding tube at bedtime. ) 330 tablet 1  . traMADol (ULTRAM) 50 MG tablet Place 100 mg into feeding tube every 6 (six) hours as needed for moderate pain.    Marland Kitchen amphetamine-dextroamphetamine (ADDERALL) 10 MG tablet 1-3 daily as needed for sleepiness (Patient taking differently: Place 10 mg into feeding tube 3 (three) times daily as needed (to stay awake). ) 90 tablet 0  . cephALEXin (KEFLEX) 500 MG capsule Take 1 capsule (500 mg total) by mouth 2 (two) times daily. (Patient not taking: Reported on 10/30/2018)  14 capsule 0  . ergocalciferol (DRISDOL) 8000 UNIT/ML drops Place 6 mLs (48,000 Units total) into feeding tube once a week. (Patient not taking: Reported on 10/30/2018) 60 mL 1  . HYDROcodone-acetaminophen (NORCO/VICODIN) 5-325 MG tablet Take 1 tablet by mouth every 6 (six) hours as needed for moderate pain. (Patient not taking: Reported on 10/30/2018) 30 tablet 0  . hydrocortisone cream 1 % APPLY EXTERNALLY TO THE AFFECTED AREA TWICE DAILY (Patient taking differently: Apply 1 application topically 2 (two)  times daily as needed for itching. ) 30 g 0  . levocetirizine (XYZAL) 5 MG tablet Take 1 tablet (5 mg total) by mouth every evening. (Patient not taking: Reported on 10/30/2018) 30 tablet 0  . LORazepam (ATIVAN) 0.5 MG tablet Take 1 tab po at the MRI scanner for anxiety. (Patient not taking: Reported on 10/30/2018) 1 tablet 0  . SUMAtriptan (IMITREX) 100 MG tablet Take 0.5-1 tablet by mouth at the onset of migraine and may repeat in 2 hours if headache persists or recurs. (Patient not taking: Reported on 10/30/2018) 10 tablet 0    No results found for this or any previous visit (from the past 48 hour(s)). No results found.  Review of Systems  Constitutional: Negative.   HENT: Negative.   Eyes: Negative.   Respiratory: Negative.   Cardiovascular: Negative.   Genitourinary: Negative.   Musculoskeletal: Positive for back pain.  Neurological: Positive for tingling.  Psychiatric/Behavioral: Negative.     Blood pressure (!) 132/91, pulse 70, temperature 98.6 F (37 C), temperature source Oral, resp. rate 18, height 5' 9"  (1.753 m), weight 87.1 kg, last menstrual period 11/15/2013, SpO2 98 %. Physical Exam  Constitutional: She is oriented to person, place, and time. No distress.  HENT:  Head: Normocephalic and atraumatic.  Eyes: Pupils are equal, round, and reactive to light. EOM are normal.  Neck: Normal range of motion.  Cardiovascular: Normal heart sounds.  Respiratory: Effort normal.  No respiratory distress.  Musculoskeletal:        General: Tenderness present.  Neurological: She is alert and oriented to person, place, and time.  Skin: Skin is warm and dry.  Psychiatric: She has a normal mood and affect.     Assessment/Plan Lumbar stenosis, back pain and LE radiculopathy  Will proceed with surgery as discussed.  All questions answered.    Benjiman Core, PA-C 11/06/2018, 7:24 AM

## 2018-11-07 ENCOUNTER — Encounter (HOSPITAL_COMMUNITY): Payer: Self-pay | Admitting: General Practice

## 2018-11-07 LAB — CBC
HCT: 30.2 % — ABNORMAL LOW (ref 36.0–46.0)
Hemoglobin: 9.9 g/dL — ABNORMAL LOW (ref 12.0–15.0)
MCH: 27 pg (ref 26.0–34.0)
MCHC: 32.8 g/dL (ref 30.0–36.0)
MCV: 82.3 fL (ref 80.0–100.0)
Platelets: 167 10*3/uL (ref 150–400)
RBC: 3.67 MIL/uL — ABNORMAL LOW (ref 3.87–5.11)
RDW: 15.3 % (ref 11.5–15.5)
WBC: 7.8 10*3/uL (ref 4.0–10.5)
nRBC: 0 % (ref 0.0–0.2)

## 2018-11-07 LAB — BASIC METABOLIC PANEL
Anion gap: 8 (ref 5–15)
BUN: 8 mg/dL (ref 6–20)
CO2: 26 mmol/L (ref 22–32)
Calcium: 8.8 mg/dL — ABNORMAL LOW (ref 8.9–10.3)
Chloride: 108 mmol/L (ref 98–111)
Creatinine, Ser: 0.77 mg/dL (ref 0.44–1.00)
GFR calc Af Amer: >60 mL/min (ref >60)
GFR calc non Af Amer: >60 mL/min (ref >60)
Glucose, Bld: 117 mg/dL — ABNORMAL HIGH (ref 70–99)
Potassium: 3.6 mmol/L (ref 3.5–5.1)
Sodium: 142 mmol/L (ref 135–145)

## 2018-11-07 MED ORDER — FERROUS SULFATE 300 (60 FE) MG/5ML PO SYRP
300.0000 mg | ORAL_SOLUTION | Freq: Every day | ORAL | Status: DC
  Administered 2018-11-08: 300 mg
  Filled 2018-11-07 (×2): qty 5

## 2018-11-07 MED ORDER — ENSURE ENLIVE PO LIQD
237.0000 mL | Freq: Two times a day (BID) | ORAL | Status: DC
  Administered 2018-11-07 – 2018-11-09 (×4): 237 mL via ORAL

## 2018-11-07 MED ORDER — DOCUSATE SODIUM 50 MG/5ML PO LIQD
100.0000 mg | Freq: Two times a day (BID) | ORAL | Status: DC
  Filled 2018-11-07 (×2): qty 10

## 2018-11-07 MED ORDER — PANTOPRAZOLE SODIUM 40 MG PO PACK
40.0000 mg | PACK | Freq: Every day | ORAL | Status: DC
  Administered 2018-11-08 – 2018-11-09 (×2): 40 mg
  Filled 2018-11-07 (×3): qty 20

## 2018-11-07 MED ORDER — LORATADINE 10 MG PO TABS
10.0000 mg | ORAL_TABLET | Freq: Every evening | ORAL | Status: DC
  Administered 2018-11-07 – 2018-11-08 (×2): 10 mg
  Filled 2018-11-07 (×2): qty 1

## 2018-11-07 MED FILL — Thrombin (Recombinant) For Soln 20000 Unit: CUTANEOUS | Qty: 1 | Status: AC

## 2018-11-07 NOTE — Progress Notes (Signed)
Initial Nutrition Assessment  DOCUMENTATION CODES:   Not applicable  INTERVENTION:  Provide Ensure Enlive po BID, each supplement provides 350 kcal and 20 grams of protein  Encourage adequate PO intake.   NUTRITION DIAGNOSIS:   Increased nutrient needs related to post-op healing as evidenced by estimated needs.  GOAL:   Patient will meet greater than or equal to 90% of their needs  MONITOR:   PO intake, Supplement acceptance, Skin, Weight trends, Labs, I & O's  REASON FOR ASSESSMENT:   Consult Assessment of nutrition requirement/status  ASSESSMENT:   53 year old black female history of lumbar stenosis, back pain, and LE radiculopathy, Pt with history of undergoing resection of base of tongue cancer and left selective neck dissection (levels 1 and 2) on 09/27/16, along with Gtube placement. She then underwent RT, completed January 01, 2017. She briefly had a tracheostomy tube but that was decannulated not long after surgery.  PROCEDURE: (10/19): Explore fusion Lumbar three-four probable transforaminal lumbar interbody fusion with revision of lumbar three bilateral pedicle screws, local bone graft and Vivigen, Extension of fusion to Lumbar five-sacral one with rods, screws and cages    Meal completion has been 100%. Pt reports having a good appetite currently and PTA with no difficulties. Pt with history of ENT surgery and has PEG in place. Pt reports no longer using her PEG for nutrition as PO intake has been adequate, however reports still using her feeding tube for medications only. Pt with no significant weight loss per weight records. Pt currently has Ensure ordered and has been consuming them. RD to continue with current orders to aid in caloric and protein needs.   Unable to complete Nutrition-Focused physical exam at this time.   Labs and medications reviewed.   Diet Order:   Diet Order            DIET SOFT Room service appropriate? Yes; Fluid consistency: Thin  Diet  effective now              EDUCATION NEEDS:   Not appropriate for education at this time  Skin:  Skin Assessment: Skin Integrity Issues: Skin Integrity Issues:: Incisions Incisions: back  Last BM:  10/18  Height:   Ht Readings from Last 1 Encounters:  11/06/18 5' 9"  (1.753 m)    Weight:   Wt Readings from Last 1 Encounters:  11/06/18 87.1 kg    Ideal Body Weight:  65.9 kg  BMI:  Body mass index is 28.35 kg/m.  Estimated Nutritional Needs:   Kcal:  1950-2150  Protein:  100-110 grams  Fluid:  >/= 1.9 L/day    Corrin Parker, MS, RD, LDN Pager # [phone] After hours/ weekend pager # [phone]

## 2018-11-07 NOTE — Evaluation (Signed)
Occupational Therapy Evaluation Patient Details Name: Kristen [name] MRN: [id number] DOB: Aug 07, 1965 Today's Date: 11/07/2018    History of Present Illness The patient is a pleasant 53 yo female s/p exploration of prior L3-4 fusion and extension of fusion to L5-S1/ PMH sig for anxiety and prior lumbar fusion.   Clinical Impression   Pt with decline in function and safety with ADLs and ADL mobility with impaired endurance and balance. Pt limited by pain but agreeable to OT; pre medicated. Session limited due to TLSO not arrived yet and pt only allowed to ambulate to bathroom without brace per MD. Pt reports that PTA, she lived at home with her boyfriend and was independent with ADLs/selfcare, home mgt, was driving and with mobility. Pt currently requires sup with bed mobility, mod - max A with LB ADLs and min guard A for mobility using RW. Pt would benefit from acute OT services to address impairments to maximize level of function and safety    Follow Up Recommendations  Home health OT;Supervision - Intermittent    Equipment Recommendations  Tub/shower bench;Other (comment)(ADL A/E kit)    Recommendations for Other Services       Precautions / Restrictions Precautions Precautions: Back Precaution Comments: reviewd back precautions with pt; pt able to recall Required Braces or Orthoses: Other Brace Other Brace: LSO with thigh attachment, not yet delivered to pt room yet Restrictions Weight Bearing Restrictions: No Other Position/Activity Restrictions: per MD, pt ok to ambulate to bathroom unitl TLSO arrives      Mobility Bed Mobility Overal bed mobility: Needs Assistance Bed Mobility: Rolling;Sidelying to Sit;Sit to Sidelying Rolling: Supervision Sidelying to sit: Supervision     Sit to sidelying: Supervision General bed mobility comments: VCs for logroll technique, needs elevated HOB  Transfers Overall transfer level: Needs assistance Equipment used: Rolling walker (2  wheeled) Transfers: Sit to/from Stand Sit to Stand: Min guard         General transfer comment: VCs for precautions, hand placement, and technique    Balance Overall balance assessment: Needs assistance Sitting-balance support: Feet supported Sitting balance-Leahy Scale: Fair     Standing balance support: Bilateral upper extremity supported;During functional activity Standing balance-Leahy Scale: Fair                             ADL either performed or assessed with clinical judgement   ADL Overall ADL's : Needs assistance/impaired Eating/Feeding: Independent;Sitting   Grooming: Wash/dry hands;Wash/dry face;Min guard;Standing   Upper Body Bathing: Set up;Sitting   Lower Body Bathing: Moderate assistance;Sitting/lateral leans   Upper Body Dressing : Set up;Sitting   Lower Body Dressing: Maximal assistance   Toilet Transfer: Min guard;Ambulation;RW;Comfort height toilet   Toileting- Clothing Manipulation and Hygiene: Moderate assistance;Sit to/from stand       Functional mobility during ADLs: Min guard;Rolling walker General ADL Comments: pt familiar with ADL A/E kit from previous surgery, howver only has a Secondary school teacher at home. Reviewed ADL A/E for home use with LB ADLs     Vision Baseline Vision/History: Wears glasses Wears Glasses: At all times Patient Visual Report: No change from baseline       Perception     Praxis      Pertinent Vitals/Pain Pain Assessment: 0-10 Pain Score: 6  Pain Location: Low back (surgical site) Pain Descriptors / Indicators: Throbbing Pain Intervention(s): Limited activity within patient's tolerance;Monitored during session;Premedicated before session;Repositioned     Hand Dominance Right   Extremity/Trunk Assessment  Upper Extremity Assessment Upper Extremity Assessment: Overall WFL for tasks assessed   Lower Extremity Assessment Lower Extremity Assessment: Defer to PT evaluation   Cervical / Trunk  Assessment Cervical / Trunk Assessment: Normal   Communication Communication Communication: No difficulties   Cognition Arousal/Alertness: Awake/alert Behavior During Therapy: WFL for tasks assessed/performed Overall Cognitive Status: Within Functional Limits for tasks assessed                                     General Comments       Exercises     Shoulder Instructions      Home Living Family/patient expects to be discharged to:: Private residence Living Arrangements: Spouse/significant other Available Help at Discharge: Family;Available 24 hours/day Type of Home: House       Home Layout: One level     Bathroom Shower/Tub: Teacher, early years/pre: Standard     Home Equipment: Environmental consultant - 2 wheels;Bedside commode          Prior Functioning/Environment Level of Independence: Independent with assistive device(s)  Gait / Transfers Assistance Needed: Use RW, household amb or short community amb ADL's / Homemaking Assistance Needed: ind with ADLs/selfcare, home mgt, driving            OT Problem List: Impaired balance (sitting and/or standing);Pain;Decreased activity tolerance;Decreased knowledge of use of DME or AE      OT Treatment/Interventions: Self-care/ADL training;DME and/or AE instruction;Therapeutic activities;Patient/family education    OT Goals(Current goals can be found in the care plan section) Acute Rehab OT Goals Patient Stated Goal: return home OT Goal Formulation: With patient Time For Goal Achievement: 11/21/18 ADL Goals Pt Will Perform Grooming: with supervision;with set-up;standing Pt Will Perform Lower Body Bathing: with min assist;with adaptive equipment Pt Will Perform Lower Body Dressing: with mod assist;with adaptive equipment Pt Will Transfer to Toilet: with supervision;with modified independence;ambulating Pt Will Perform Toileting - Clothing Manipulation and hygiene: with min assist;with min guard assist;sit  to/from stand Pt Will Perform Tub/Shower Transfer: with min guard assist;with supervision;ambulating;rolling walker;tub bench Additional ADL Goal #1: Pt will recall all back precautions and be able to maintain/demo during ADLs and ADL mobility  OT Frequency: Min 2X/week   Barriers to D/C:    no barriers       Co-evaluation              AM-PAC OT "6 Clicks" Daily Activity     Outcome Measure Help from another person eating meals?: None Help from another person taking care of personal grooming?: A Little Help from another person toileting, which includes using toliet, bedpan, or urinal?: A Lot Help from another person bathing (including washing, rinsing, drying)?: A Lot Help from another person to put on and taking off regular upper body clothing?: None Help from another person to put on and taking off regular lower body clothing?: A Lot 6 Click Score: 17   End of Session Equipment Utilized During Treatment: Gait belt;Rolling walker  Activity Tolerance: Patient tolerated treatment well Patient left: in bed;with call bell/phone within reach  OT Visit Diagnosis: Unsteadiness on feet (R26.81);Pain;Muscle weakness (generalized) (M62.81) Pain - Right/Left: (back surgical site) Pain - part of body: (back)                Time: 4098-1191 OT Time Calculation (min): 28 min Charges:  OT General Charges $OT Visit: 1 Visit OT Evaluation $OT Eval Low Complexity: 1  Low OT Treatments $Self Care/Home Management : 8-22 mins    Britt Bottom 11/07/2018, 2:21 PM

## 2018-11-07 NOTE — Evaluation (Signed)
Physical Therapy Evaluation Patient Details Name: Kristen [name] MRN: [id number] DOB: 10/08/1965 Today's Date: 11/07/2018   History of Present Illness  The patient is a pleasant 53 yo female s/p exploration of prior L3-4 fusion and extension of fusion to L5-S1/ PMH sig for anxiety and prior lumbar fusion.  Clinical Impression  The patient was in bed upon PT arrival and very eager to participate in therapy. PT session limited by absence of back brace (LSO with thigh attachment) at this time, but per MD orders, pt is able to ambulate to bathroom without brace. Pt was educated on bed mobility with log roll technique and spinal precautions, able to complete with min guard and VCs for technique. Pt also tolerated short amb well, but demos significantly decreased gait speed (0.45ms). A gait speed less than 0.669m indicates increased risk of falls and dependence in ADLs, and therefore the patient will continue to benefit from skilled PT to address limitations in functional endurance and mobility with spinal precautions.      Follow Up Recommendations Home health PT;Supervision/Assistance - 24 hour    Equipment Recommendations  Rolling walker with 5" wheels    Recommendations for Other Services       Precautions / Restrictions Precautions Precautions: Back Precaution Booklet Issued: Yes (comment) Required Braces or Orthoses: Other Brace Other Brace: LSO with thigh attachment, not yet delivered to pt room      Mobility  Bed Mobility Overal bed mobility: Needs Assistance Bed Mobility: Rolling Rolling: Supervision         General bed mobility comments: VCs for logroll technique, needs elevated HOB  Transfers Overall transfer level: Needs assistance Equipment used: Rolling walker (2 wheeled) Transfers: Sit to/from Stand Sit to Stand: Min guard         General transfer comment: VCs for precautions, hand placement, and technique  Ambulation/Gait Ambulation/Gait assistance:  Min guard Gait Distance (Feet): 20 Feet Assistive device: Rolling walker (2 wheeled) Gait Pattern/deviations: Step-through pattern Gait velocity: 0.35m8mGait velocity interpretation: <1.31 ft/sec, indicative of household ambulator General Gait Details: Pt ambulates with sig decreased gait speed (0.35m/48m but is able to use RW without physical assist. Requires VCs for maintenence of spinal precautions.  Stairs            Wheelchair Mobility    Modified Rankin (Stroke Patients Only)       Balance Overall balance assessment: Needs assistance Sitting-balance support: Feet supported Sitting balance-Leahy Scale: Fair     Standing balance support: Bilateral upper extremity supported Standing balance-Leahy Scale: Fair                               Pertinent Vitals/Pain Pain Assessment: 0-10 Pain Score: 8 (Improved to 5/10 with ambulation) Pain Location: Low back (surgical site) Pain Descriptors / Indicators: Throbbing Pain Intervention(s): Repositioned;Premedicated before session    HomeCobreects to be discharged to:: Private residence Living Arrangements: Spouse/significant other(boyfriend) Available Help at Discharge: Family;Available 24 hours/day Type of Home: House Home Access: Level entry     Home Layout: One level Home Equipment: Walker - 2 wheels;Bedside commode      Prior Function Level of Independence: Independent with assistive device(s)   Gait / Transfers Assistance Needed: Use RW, household amb or short community amb  ADL's / Homemaking Assistance Needed: ind with ADLs        Hand Dominance   Dominant Hand: Right    Extremity/Trunk Assessment  Upper Extremity Assessment Upper Extremity Assessment: Overall WFL for tasks assessed    Lower Extremity Assessment Lower Extremity Assessment: Overall WFL for tasks assessed    Cervical / Trunk Assessment Cervical / Trunk Assessment: Normal  Communication    Communication: No difficulties  Cognition Arousal/Alertness: Awake/alert Behavior During Therapy: WFL for tasks assessed/performed Overall Cognitive Status: Within Functional Limits for tasks assessed                                 General Comments: Pt very agreeable and cooperative with therapy, eager to participate      General Comments      Exercises     Assessment/Plan    PT Assessment Patient needs continued PT services  PT Problem List Decreased mobility;Decreased activity tolerance;Decreased balance;Pain       PT Treatment Interventions DME instruction;Gait training;Therapeutic exercise;Balance training;Stair training;Functional mobility training;Therapeutic activities    PT Goals (Current goals can be found in the Care Plan section)  Acute Rehab PT Goals Patient Stated Goal: return home PT Goal Formulation: With patient Time For Goal Achievement: 11/21/18 Potential to Achieve Goals: Good    Frequency Min 3X/week   Barriers to discharge        Co-evaluation               AM-PAC PT "6 Clicks" Mobility  Outcome Measure Help needed turning from your back to your side while in a flat bed without using bedrails?: A Little Help needed moving from lying on your back to sitting on the side of a flat bed without using bedrails?: A Little Help needed moving to and from a bed to a chair (including a wheelchair)?: A Little Help needed standing up from a chair using your arms (e.g., wheelchair or bedside chair)?: A Little Help needed to walk in hospital room?: A Little Help needed climbing 3-5 steps with a railing? : A Lot 6 Click Score: 17    End of Session Equipment Utilized During Treatment: Gait belt(Pt back brace has not arrived yet, amb to bathroom within room following MD orders) Activity Tolerance: Patient tolerated treatment well Patient left: in bed;with call bell/phone within reach;with SCD's reapplied Nurse Communication: Mobility  status;Precautions PT Visit Diagnosis: Unsteadiness on feet (R26.81);Other abnormalities of gait and mobility (R26.89);Pain Pain - part of body: (back)    Time: 7867-6720 PT Time Calculation (min) (ACUTE ONLY): 38 min   Charges:   PT Evaluation $PT Eval Low Complexity: 1 Low PT Treatments $Gait Training: 8-22 mins $Self Care/Home Management: 8-22        Mickey Farber, PT, DPT   Acute Rehabilitation Department [phone]

## 2018-11-07 NOTE — Progress Notes (Signed)
Nurse couldn't mobilized patient scheduled  at midnight because back brace is not available until morning according to [employer]. Will remove foley out at AM and see if patient can walk to the bathroom to void or have a bowel movement. Thanks!

## 2018-11-07 NOTE — Progress Notes (Signed)
     Subjective: 1 Day Post-Op Procedure(s) (LRB): Explore fusion Lumbar three-four probable transforaminal lumbar interbody fusion with revision of lumbar three bilateral pedicle screws, local bone graft and Vivigen, Extension of fusion to Lumbar five-sacral one with rods, screws and cages (N/A) Awake, alert and oriented x 4. Partial tongue resection has difficulty with swallowing pills. The legs are N-V intact. Brace measured today and being made by [employer], she is able to ambulate short distance to Bathroom and back.   Patient reports pain as moderate.    Objective:   VITALS:  Temp:  [98.4 F (36.9 C)-98.9 F (37.2 C)] 98.7 F (37.1 C) (10/20 1453) Pulse Rate:  [82-94] 94 (10/20 1453) Resp:  [16-17] 17 (10/20 1453) BP: (94-126)/(65-99) 94/65 (10/20 1453) SpO2:  [98 %-99 %] 99 % (10/20 1453)  Neurologically intact ABD soft Neurovascular intact Sensation intact distally Intact pulses distally Dorsiflexion/Plantar flexion intact Incision: dressing C/D/I  Hemovac discontinued. Foley removed last evening and she has been able to void. LABS Recent Labs    11/07/18 0318  HGB 9.9*  WBC 7.8  PLT 167   Recent Labs    11/07/18 0318  NA 142  K 3.6  CL 108  CO2 26  BUN 8  CREATININE 0.77  GLUCOSE 117*   No results for input(s): LABPT, INR in the last 72 hours.   Assessment/Plan: 1 Day Post-Op Procedure(s) (LRB): Explore fusion Lumbar three-four probable transforaminal lumbar interbody fusion with revision of lumbar three bilateral pedicle screws, local bone graft and Vivigen, Extension of fusion to Lumbar five-sacral one with rods, screws and cages (N/A)  Advance diet Up with therapy D/C IV fluids Check H/H in the AM.  Basil Dess 11/07/2018, 5:39 PMPatient ID: Kristen [name], female   DOB: [birth year], 53 y.o.   MRN: [id number]

## 2018-11-08 ENCOUNTER — Encounter (HOSPITAL_COMMUNITY): Payer: Self-pay | Admitting: *Deleted

## 2018-11-08 LAB — CBC WITH DIFFERENTIAL/PLATELET
Abs Immature Granulocytes: 0.05 10*3/uL (ref 0.00–0.07)
Basophils Absolute: 0 10*3/uL (ref 0.0–0.1)
Basophils Relative: 0 %
Eosinophils Absolute: 0 10*3/uL (ref 0.0–0.5)
Eosinophils Relative: 0 %
HCT: 29.6 % — ABNORMAL LOW (ref 36.0–46.0)
Hemoglobin: 9.5 g/dL — ABNORMAL LOW (ref 12.0–15.0)
Immature Granulocytes: 1 %
Lymphocytes Relative: 14 %
Lymphs Abs: 1.2 10*3/uL (ref 0.7–4.0)
MCH: 27.2 pg (ref 26.0–34.0)
MCHC: 32.1 g/dL (ref 30.0–36.0)
MCV: 84.8 fL (ref 80.0–100.0)
Monocytes Absolute: 0.6 10*3/uL (ref 0.1–1.0)
Monocytes Relative: 7 %
Neutro Abs: 6.3 10*3/uL (ref 1.7–7.7)
Neutrophils Relative %: 78 %
Platelets: 144 10*3/uL — ABNORMAL LOW (ref 150–400)
RBC: 3.49 MIL/uL — ABNORMAL LOW (ref 3.87–5.11)
RDW: 15.6 % — ABNORMAL HIGH (ref 11.5–15.5)
WBC: 8.1 10*3/uL (ref 4.0–10.5)
nRBC: 0 % (ref 0.0–0.2)

## 2018-11-08 MED ORDER — FERROUS SULFATE 300 (60 FE) MG/5ML PO SYRP
300.0000 mg | ORAL_SOLUTION | Freq: Two times a day (BID) | ORAL | Status: DC
  Administered 2018-11-09: 300 mg
  Filled 2018-11-08 (×3): qty 5

## 2018-11-08 MED FILL — Sodium Chloride IV Soln 0.9%: INTRAVENOUS | Qty: 3000 | Status: AC

## 2018-11-08 MED FILL — Heparin Sodium (Porcine) Inj 1000 Unit/ML: INTRAMUSCULAR | Qty: 30 | Status: AC

## 2018-11-08 NOTE — Progress Notes (Signed)
Physical Therapy Treatment Patient Details Name: Kristen [name] MRN: [id number] DOB: 08/16/1965 Today's Date: 11/08/2018    History of Present Illness The patient is a pleasant 53 yo female s/p exploration of prior L3-4 fusion and extension of fusion to L5-S1/ PMH sig for anxiety and prior lumbar fusion.    PT Comments    Pt in bed upon arrival of PT, was able to demonstrate good memory of spinal precautions with bed mobility, as well as improved independence with transfers (sit-stand from bed and toilet). The patient continues to ambulate very well in her room, but moves with a gait speed of 0.32ms using a RW and with supervision. A gait speed less than 0.655m indicates increased risk of falls and dependence in ADLs, and therefore the patient will continue to benefit from skilled PT to address limitations in functional endurance and mobility. Anticipate sig improvements in ambulation endurance when back brace arrives and pt is able to ambulate longer distances.     Follow Up Recommendations  Home health PT;Supervision/Assistance - 24 hour     Equipment Recommendations  Rolling walker with 5" wheels    Recommendations for Other Services       Precautions / Restrictions Precautions Precautions: Back Precaution Comments: reviewd back precautions with pt; pt able to recall Required Braces or Orthoses: Other Brace Other Brace: LSO with thigh attachment, not yet delivered to pt room yet    Mobility  Bed Mobility Overal bed mobility: Needs Assistance Bed Mobility: Rolling;Sidelying to Sit;Sit to Sidelying Rolling: Min guard Sidelying to sit: Supervision     Sit to sidelying: Min assist General bed mobility comments: VCs for logroll technique, needs elevated HOB. MinA for BLE to return to bed  Transfers Overall transfer level: Needs assistance Equipment used: Rolling walker (2 wheeled) Transfers: Sit to/from Stand Sit to Stand: Min guard         General transfer  comment: VCs for precautions, hand placement, and technique  Ambulation/Gait Ambulation/Gait assistance: Min guard Gait Distance (Feet): 20 Feet Assistive device: Rolling walker (2 wheeled) Gait Pattern/deviations: Step-through pattern Gait velocity: 0.58m69mGait velocity interpretation: <1.31 ft/sec, indicative of household ambulator General Gait Details: Pt ambulates with sig decreased gait speed (0.58m/32m but is able to use RW without physical assist. Requires VCs for maintenence of spinal precautions during functional tasks.   Stairs             Wheelchair Mobility    Modified Rankin (Stroke Patients Only)       Balance Overall balance assessment: Needs assistance Sitting-balance support: Feet supported Sitting balance-Leahy Scale: Fair     Standing balance support: Bilateral upper extremity supported;During functional activity Standing balance-Leahy Scale: Fair                              Cognition Arousal/Alertness: Awake/alert Behavior During Therapy: WFL for tasks assessed/performed Overall Cognitive Status: Within Functional Limits for tasks assessed                                 General Comments: Pt very agreeable and cooperative with therapy, eager to participate      Exercises      General Comments        Pertinent Vitals/Pain Pain Assessment: 0-10 Pain Score: 7  Pain Location: Low back (surgical site) Pain Descriptors / Indicators: Throbbing Pain Intervention(s): Limited activity within patient's tolerance;Monitored during  session;Patient requesting pain meds-RN notified;Repositioned    Home Living                      Prior Function            PT Goals (current goals can now be found in the care plan section) Acute Rehab PT Goals Patient Stated Goal: return home PT Goal Formulation: With patient Time For Goal Achievement: 11/21/18 Potential to Achieve Goals: Good Progress towards PT goals:  Progressing toward goals    Frequency    Min 3X/week      PT Plan Current plan remains appropriate    Co-evaluation              AM-PAC PT "6 Clicks" Mobility   Outcome Measure  Help needed turning from your back to your side while in a flat bed without using bedrails?: A Little Help needed moving from lying on your back to sitting on the side of a flat bed without using bedrails?: A Little Help needed moving to and from a bed to a chair (including a wheelchair)?: A Little Help needed standing up from a chair using your arms (e.g., wheelchair or bedside chair)?: A Little Help needed to walk in hospital room?: A Little Help needed climbing 3-5 steps with a railing? : A Lot 6 Click Score: 17    End of Session Equipment Utilized During Treatment: Gait belt Activity Tolerance: Patient tolerated treatment well Patient left: in bed;with call bell/phone within reach;with SCD's reapplied Nurse Communication: Mobility status;Patient requests pain meds PT Visit Diagnosis: Unsteadiness on feet (R26.81);Other abnormalities of gait and mobility (R26.89);Pain Pain - part of body: (back)     Time: 0045-9977 PT Time Calculation (min) (ACUTE ONLY): 24 min  Charges:  $Gait Training: 8-22 mins $Therapeutic Activity: 8-22 mins                     Mickey Farber, PT, DPT   Acute Rehabilitation Department [phone]

## 2018-11-08 NOTE — Progress Notes (Signed)
Patient ID: Kristen [name], female   DOB: 08-Oct-1965, 53 y.o.   MRN: [id number]   Patient seen by me earlier this morning.  She was doing well.  Preop leg symptoms greatly improved.  Exam Very pleasant female alert and oriented in no acute distress.  Surgical incision looks good.  No drainage or signs of infection.  Staples intact.  Patient neurologically intact.  Bilateral calves nontender.   Plan Continue present care.  Possible discharge home within the next day or 2 depending upon how she does with rehab.

## 2018-11-08 NOTE — Progress Notes (Signed)
     Subjective: 2 Days Post-Op Procedure(s) (LRB): Explore fusion Lumbar three-four probable transforaminal lumbar interbody fusion with revision of lumbar three bilateral pedicle screws, local bone graft and Vivigen, Extension of fusion to Lumbar five-sacral one with rods, screws and cages (N/A)Moderate pain today notes that night nursing would not assist her to bathroom so she had to use a bed pan. Voiding  Post d/c of foley. Ambulated with walker and stand by  Patient reports pain as moderate.    Objective:   VITALS:  Temp:  [98.4 F (36.9 C)-100.6 F (38.1 C)] 98.4 F (36.9 C) (10/21 1518) Pulse Rate:  [95-106] 96 (10/21 1518) Resp:  [15-17] 16 (10/21 1518) BP: (95-118)/(65-87) 99/68 (10/21 1518) SpO2:  [98 %-100 %] 98 % (10/21 1518)  Neurologically intact ABD soft Neurovascular intact Sensation intact distally Intact pulses distally Dorsiflexion/Plantar flexion intact Incision: dressing C/D/I No cellulitis present change dressing today.   LABS Recent Labs    11/07/18 0318 11/08/18 0329  HGB 9.9* 9.5*  WBC 7.8 8.1  PLT 167 144*   Recent Labs    11/07/18 0318  NA 142  K 3.6  CL 108  CO2 26  BUN 8  CREATININE 0.77  GLUCOSE 117*   No results for input(s): LABPT, INR in the last 72 hours.   Assessment/Plan: 2 Days Post-Op Procedure(s) (LRB): Explore fusion Lumbar three-four probable transforaminal lumbar interbody fusion with revision of lumbar three bilateral pedicle screws, local bone graft and Vivigen, Extension of fusion to Lumbar five-sacral one with rods, screws and cages (N/A)  Advance diet Up with therapy D/C IV fluids Still off balance with walking, stands to improve with more PT/OT with brace.   Basil Dess 11/08/2018, 5:07 PMPatient ID: Kristen [name], female   DOB: 1965/06/30, 53 y.o.   MRN: [id number]

## 2018-11-08 NOTE — TOC Initial Note (Addendum)
Transition of Care [facility]) - Initial/Assessment Note    Patient Details  Name: Kristen [name] MRN: [id number] Date of Birth: 1965/06/17  Transition of Care [facility]) CM/SW Contact:    Sharin Mons, RN Phone Number: 11/08/2018, 5:02 PM  Clinical Narrative:     Presented with  loosening of hardware Lumbar three-four, s/p  repair 10/19.     From home with fiance', Freida Busman. Pt states PTA required minimal assist with ADL's.   Per PT's evalution/ recommendation:Home health PT;Supervision/Assistance - 24 hour.  NCM shared recommendation with pt. Pt states will have 24/7 supervision and assistance once d/c. Pt agreeable to [facility] services. Choice offered and pt selected [place]. Referral made with [facility], acceptance pending..... Pt already has rolling walker , BSC . Requesting shower bench. NCM to f/u .....      Expected Discharge Plan: [place] Barriers to Discharge: Continued Medical Work up   Patient Goals and CMS Choice Patient states their goals for this hospitalization and ongoing recovery are:: to get better and go home CMS Medicare.gov Compare Post Acute Care list provided to:: Patient Choice offered to / list presented to : Patient  Expected Discharge Plan and Services Expected Discharge Plan: [place]   Discharge Planning Services: CM Consult Post Acute Care Choice: Home Health, Durable Medical Equipment(owns walker, BSC, requesting shower bench) Living arrangements for the past 2 months: [place]: PT [place] Agency: [place] ([place]) Date [place]: 11/08/18 Time [place]: 1700 Representative spoke with at [place]: [place], acceptance pending  Prior Living Arrangements/Services Living arrangements for the past 2 months: [place] with:: Significant Other(Mario Retail banker) Patient language and need for interpreter reviewed:: Yes Do you feel  safe going back to the place where you live?: Yes      Need for Family Participation in Patient Care: Yes (Comment) Care giver support system in place?: Yes (comment)   Criminal Activity/Legal Involvement Pertinent to Current Situation/Hospitalization: No - Comment as needed  Activities of Daily Living Home Assistive Devices/Equipment: Eyeglasses, Enteral Feeding Supplies ADL Screening (condition at time of admission) Patient's cognitive ability adequate to safely complete daily activities?: Yes Is the patient deaf or have difficulty hearing?: No Does the patient have difficulty seeing, even when wearing glasses/contacts?: No Does the patient have difficulty concentrating, remembering, or making decisions?: No Patient able to express need for assistance with ADLs?: Yes Does the patient have difficulty dressing or bathing?: No Independently performs ADLs?: Yes (appropriate for developmental age) Does the patient have difficulty walking or climbing stairs?: Yes Weakness of Legs: Both Weakness of Arms/Hands: None  Permission Sought/Granted Permission sought to share information with : Case Manager Permission granted to share information with : Yes, Verbal Permission Granted  Share Information with NAME: [facility]) [phone]           Emotional Assessment Appearance:: Appears stated age Attitude/Demeanor/Rapport: Engaged Affect (typically observed): Accepting Orientation: : Oriented to Self, Oriented to Place, Oriented to  Time, Oriented to Situation Alcohol / Substance Use: Not Applicable Psych Involvement: No (comment)  Admission diagnosis:  loosening of hardware L3-4, probable pseudarthrosis L3-4 Transforaminal lumbar interbody fusion, severe Left L5-S1 foraminal narrowing with Degenerative Disc Disease Patient Active Problem List   Diagnosis Date Noted  . History of lumbar spinal fusion  11/06/2018  . Acute encephalopathy 05/15/2017  . UTI (urinary tract infection)  05/15/2017  . Hypotension 02/10/2017  . 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 Malignant neoplasm of base of tongue ([place]) 10/29/2016  . 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[place]) 10/29/2016  . Headache associated with sexual activity 05/14/2016  . Tongue lesion 05/14/2016  . Loose stools 03/12/2016  . Hypocalcemia 01/22/2016  . Thrombocytopenia ([place]) 01/22/2016  . Chronic diarrhea 01/22/2016  . Chest pain   . Essential hypertension   . Orthostatic hypotension   . Spinal stenosis of lumbar region 01/20/2016    Class: Chronic  . DDD (degenerative disc disease), lumbar 01/20/2016    Class: Chronic  . Spinal stenosis of lumbar region with neurogenic claudication 01/20/2016  . Low back pain 12/22/2015  . Pre-op examination 12/22/2015  . Hypersomnolence 10/13/2015  . Muscle cramp 08/01/2015  . GERD (gastroesophageal reflux disease) 07/09/2015  . Excessive daytime sleepiness 06/18/2015  . Shingles 08/28/2013  . Postop check 08/28/2013  . Cholelithiasis 07/10/2013  . Colitis 01/08/2013  . Postoperative anemia 01/08/2013  . Morbid obesity with BMI of 40.0-44.9, adult ([place]) 01/08/2013   PCP:  [name], MD Pharmacy:   [facility] [place], [address] [place]  [address] Phone: [phone] Fax: [phone]  [place], [address] [place] [address] Phone: [phone] Fax: [phone]     Social Determinants of Health (SDOH) Interventions    Readmission Risk Interventions No flowsheet data found.

## 2018-11-08 NOTE — Progress Notes (Signed)
RN informed by PA that pt c/o about night shift staffing not getting her up to walk to bathroom. After looking a night shift RN note they didn't due to back brace not being available. Okay for pt to walk to bathroom without brace. Will relay information to night shift RN. Listened to pt's concerns. Pain medicine given. Will follow up later.

## 2018-11-08 NOTE — Progress Notes (Signed)
Occupational Therapy Treatment Patient Details Name: Kristen [name] MRN: [id number] DOB: February 14, 1965 Today's Date: 11/08/2018    History of present illness The patient is a pleasant 53 yo female s/p exploration of prior L3-4 fusion and extension of fusion to L5-S1/ PMH sig for anxiety and prior lumbar fusion.   OT comments  Pt able to recall and demonstrate 3/3 back precautions this session. Pt required MIN- guard - supervision for bed mobility. Pt able to demonstrate log roll technique w/o cues. Biotech present upon OT arrival. Assisted biotech and pt with donning brace EOB. Pt complete functional mobility to<>from bathroom with min guard assist with RW. Pt required MIN guard assist for toilet transfer. Pt does require assist to manipulate side hinge on brace during sit >stand. Pt complete standing grooming at sink with supervision. Issued pt LB AE handout and provided education on each item. Pt reports she is familiar with the items and will be sure to get the items she needs at time of DC. DC plan remains appropriate. Will continue to follow for acute OT needs.    Follow Up Recommendations  Home health OT;Supervision - Intermittent    Equipment Recommendations  Tub/shower bench;Other (comment)(LB AE)    Recommendations for Other Services      Precautions / Restrictions Precautions Precautions: Back Precaution Booklet Issued: Yes (comment) Precaution Comments: reviewd back precautions with pt; pt able to recall 3/3 Required Braces or Orthoses: Other Brace Other Brace: LSO with thigh attachment, delivered today 10/21       Mobility Bed Mobility Overal bed mobility: Needs Assistance Bed Mobility: Rolling;Sidelying to Sit Rolling: Supervision Sidelying to sit: Min guard       General bed mobility comments: pt able to recall and demo log roll technique with supervision for safety; min guard to elevate trunk into sitting  Transfers Overall transfer level: Needs  assistance Equipment used: Rolling walker (2 wheeled) Transfers: Sit to/from Stand Sit to Stand: Min guard         General transfer comment: cues for hand placement and technique    Balance Overall balance assessment: Needs assistance Sitting-balance support: Feet supported Sitting balance-Leahy Scale: Fair     Standing balance support: During functional activity Standing balance-Leahy Scale: Fair Standing balance comment: able to static stand with close supervision during functional activity                           ADL either performed or assessed with clinical judgement   ADL Overall ADL's : Needs assistance/impaired     Grooming: Wash/dry hands;Min guard;Standing         Lower Body Bathing Details (indicate cue type and reason): education provided on using LH sponge for LB bathing; issued handout with all LB AE with pt verbalizing understanding of each piece of AE         Toilet Transfer: Min guard;Ambulation;RW;Regular Toilet;BSC Toilet Transfer Details (indicate cue type and reason): BSC over regular height toilet; pt requires assist to manipulate hinge on brace in order for pt to sit down Toileting- Clothing Manipulation and Hygiene: Supervision/safety;Sit to/from stand Toileting - Clothing Manipulation Details (indicate cue type and reason): cues to maintain back precautions     Functional mobility during ADLs: Min guard;Rolling walker General ADL Comments: issued handout with all AE available with pt verbalizing understanding; pt min guard- supervision for toileting tasks; pt requires assist to manipulate hinge on brace during sit>stand; MAX A to don brace at EOB  Vision Baseline Vision/History: Wears glasses Wears Glasses: At all times Patient Visual Report: No change from baseline     Perception     Praxis      Cognition   Behavior During Therapy: [facility] for tasks assessed/performed Overall Cognitive Status: Within Functional Limits  for tasks assessed                                 General Comments: Pt very agreeable and cooperative with therapy, receptive to education        Exercises     Shoulder Instructions       General Comments      Pertinent Vitals/ Pain       Pain Assessment: Faces Faces Pain Scale: Hurts little more Pain Location: Low back (surgical site) Pain Descriptors / Indicators: Discomfort Pain Intervention(s): Monitored during session;Repositioned  Home Living                                          Prior Functioning/Environment              Frequency  Min 2X/week        Progress Toward Goals  OT Goals(current goals can now be found in the care plan section)  Progress towards OT goals: Progressing toward goals  Acute Rehab OT Goals Patient Stated Goal: return home OT Goal Formulation: With patient Time For Goal Achievement: 11/21/18  Plan Discharge plan remains appropriate    Co-evaluation                 AM-PAC OT "6 Clicks" Daily Activity     Outcome Measure   Help from another person eating meals?: None Help from another person taking care of personal grooming?: A Little Help from another person toileting, which includes using toliet, bedpan, or urinal?: A Little Help from another person bathing (including washing, rinsing, drying)?: A Little Help from another person to put on and taking off regular upper body clothing?: A Lot Help from another person to put on and taking off regular lower body clothing?: A Lot 6 Click Score: 17    End of Session Equipment Utilized During Treatment: Rolling walker;Other (comment)(back brace)  OT Visit Diagnosis: Unsteadiness on feet (R26.81);Pain;Muscle weakness (generalized) (M62.81)   Activity Tolerance Patient tolerated treatment well   Patient Left in chair;with call bell/phone within reach   Nurse Communication Mobility status;Other (comment)(mechanics of back brace)         Time: 9021-1155 OT Time Calculation (min): 32 min  Charges: OT General Charges $OT Visit: 1 Visit OT Treatments $Self Care/Home Management : 23-37 mins  [place], [place] [phone] [place] 11/08/2018, 1:51 PM

## 2018-11-09 ENCOUNTER — Encounter: Payer: Self-pay | Admitting: Specialist

## 2018-11-09 LAB — BASIC METABOLIC PANEL
Anion gap: 8 (ref 5–15)
BUN: 7 mg/dL (ref 6–20)
CO2: 26 mmol/L (ref 22–32)
Calcium: 8.5 mg/dL — ABNORMAL LOW (ref 8.9–10.3)
Chloride: 107 mmol/L (ref 98–111)
Creatinine, Ser: 0.65 mg/dL (ref 0.44–1.00)
GFR calc Af Amer: >60 mL/min (ref >60)
GFR calc non Af Amer: >60 mL/min (ref >60)
Glucose, Bld: 101 mg/dL — ABNORMAL HIGH (ref 70–99)
Potassium: 3.6 mmol/L (ref 3.5–5.1)
Sodium: 141 mmol/L (ref 135–145)

## 2018-11-09 MED ORDER — OXYCODONE HCL ER 15 MG PO T12A: 15.0000 mg | EXTENDED_RELEASE_TABLET | Freq: Two times a day (BID) | ORAL | 0 refills | Status: AC

## 2018-11-09 MED ORDER — ADULT MULTIVITAMIN W/MINERALS CH: 1.0000 | ORAL_TABLET | Freq: Every day | ORAL | 6 refills | Status: DC

## 2018-11-09 MED ORDER — ENSURE ENLIVE PO LIQD: 237.0000 mL | Freq: Two times a day (BID) | ORAL | 12 refills | Status: DC

## 2018-11-09 MED ORDER — SUMATRIPTAN SUCCINATE 100 MG PO TABS: ORAL_TABLET | ORAL | 0 refills | Status: DC

## 2018-11-09 MED ORDER — TRAMADOL HCL 50 MG PO TABS: 100.0000 mg | ORAL_TABLET | Freq: Four times a day (QID) | ORAL | 0 refills | Status: DC | PRN

## 2018-11-09 MED ORDER — FERROUS SULFATE 300 (60 FE) MG/5ML PO SYRP: 300.0000 mg | ORAL_SOLUTION | Freq: Two times a day (BID) | ORAL | 3 refills | Status: DC

## 2018-11-09 NOTE — Care Management (Signed)
[facility] unable to accept. Kristen [name] with [facility] accepted [place] RN [phone]

## 2018-11-09 NOTE — Progress Notes (Signed)
AVS reviewed with pt and family. All questions answered to satisfaction. Pt belongings gathered and with them.  [place], RN 11/09/2018 3:33 PM

## 2018-11-09 NOTE — Progress Notes (Addendum)
     Subjective: 3 Days Post-Op Procedure(s) (LRB): Explore fusion Lumbar three-four probable transforaminal lumbar interbody fusion with revision of lumbar three bilateral pedicle screws, local bone graft and Vivigen, Extension of fusion to Lumbar five-sacral one with rods, screws and cages (N/A) awake, alert and oriented x4.  Patient reports pain as moderate.    Objective:   VITALS:  Temp:  [97.1 F (36.2 C)-98.8 F (37.1 C)] 97.1 F (36.2 C) (10/22 0829) Pulse Rate:  [88-101] 101 (10/22 0829) Resp:  [15-16] 16 (10/22 0829) BP: (99-105)/(67-74) 101/68 (10/22 0829) SpO2:  [98 %-100 %] 100 % (10/22 0829)  Neurologically intact ABD soft Neurovascular intact Sensation intact distally Intact pulses distally Dorsiflexion/Plantar flexion intact Incision: dressing C/D/I and no drainage   LABS Recent Labs    11/07/18 0318 11/08/18 0329  HGB 9.9* 9.5*  WBC 7.8 8.1  PLT 167 144*   Recent Labs    11/07/18 0318 11/09/18 0434  NA 142 141  K 3.6 3.6  CL 108 107  CO2 26 26  BUN 8 7  CREATININE 0.77 0.65  GLUCOSE 117* 101*   No results for input(s): LABPT, INR in the last 72 hours.   Assessment/Plan: 3 Days Post-Op Procedure(s) (LRB): Explore fusion Lumbar three-four probable transforaminal lumbar interbody fusion with revision of lumbar three bilateral pedicle screws, local bone graft and Vivigen, Extension of fusion to Lumbar five-sacral one with rods, screws and cages (N/A)  Advance diet Up with therapy D/C IV fluids Discharge home with home health May be discharged home today when ride is available later this afternoon or evening.  Basil Dess 11/09/2018, 1:17 PMPatient ID: Kristen [name], female   DOB: 1966-01-15, 52 y.o.   MRN: [id number]

## 2018-11-09 NOTE — Progress Notes (Signed)
Physical Therapy Treatment Patient Details Name: Kristen [name] MRN: [id number] DOB: May 08, 1965 Today's Date: 11/09/2018    History of Present Illness The patient is a pleasant 53 yo female s/p exploration of prior L3-4 fusion and extension of fusion to L5-S1/ PMH sig for anxiety and prior lumbar fusion.    PT Comments    Patient received up in chair, very pleasant and motivated to participate in PT and with brace/hip attachment already placed by nursing. Able to complete functional transfers with min guard and independently locked brace into extension once standing, then tolerated gait training approximately 115f with RW and S-Min guard for safety, continues to require cues for safety and to avoid twisting during dynamic tasks. Did need assist in unlocking brace to return to sitting, however. She was left up in the chair with all needs met and questions/concerns addressed this morning, reports comfort with brace application at home. Continues to do well with skilled PT services.    Follow Up Recommendations  Home health PT;Supervision/Assistance - 24 hour     Equipment Recommendations  Rolling walker with 5" wheels    Recommendations for Other Services       Precautions / Restrictions Precautions Precautions: Back Precaution Booklet Issued: Yes (comment) Precaution Comments: reviewd back precautions with pt; pt able to recall 3/3 Required Braces or Orthoses: Other Brace Other Brace: LSO with thigh attachment, delivered today 10/21 Restrictions Weight Bearing Restrictions: No Other Position/Activity Restrictions: per MD, pt ok to ambulate to bathroom unitl TLSO arrives    Mobility  Bed Mobility               General bed mobility comments: up in chair  Transfers Overall transfer level: Needs assistance Equipment used: Rolling walker (2 wheeled) Transfers: Sit to/from Stand Sit to Stand: Min guard         General transfer comment: cues for hand placement and  technique  Ambulation/Gait Ambulation/Gait assistance: Supervision;Min guard Gait Distance (Feet): 100 Feet Assistive device: Rolling walker (2 wheeled) Gait Pattern/deviations: Step-through pattern Gait velocity: decreased Gait velocity interpretation: <1.31 ft/sec, indicative of household ambulator General Gait Details: able to progress gait distance with brace on today, gait initially 0.370m and slowed to 0.35m40mat end of walk, cues to avoid twisting during functional mobility   Stairs             Wheelchair Mobility    Modified Rankin (Stroke Patients Only)       Balance Overall balance assessment: Needs assistance Sitting-balance support: Feet supported Sitting balance-Leahy Scale: Good     Standing balance support: Bilateral upper extremity supported;During functional activity Standing balance-Leahy Scale: Fair Standing balance comment: able to static stand with close supervision during functional activity, BUE support during dynamic tasks                            Cognition Arousal/Alertness: Awake/alert Behavior During Therapy: WFL for tasks assessed/performed Overall Cognitive Status: Within Functional Limits for tasks assessed                                 General Comments: Pt very agreeable and cooperative with therapy, receptive to education      Exercises      General Comments General comments (skin integrity, edema, etc.): now has tlso with hip attachment      Pertinent Vitals/Pain Pain Assessment: 0-10 Pain Score: 8  Pain  Location: Low back (surgical site) initially 3/10, increased to 8/10 with ambulation Pain Descriptors / Indicators: Discomfort;Aching;Sore Pain Intervention(s): Monitored during session;Limited activity within patient's tolerance    Home Living                      Prior Function            PT Goals (current goals can now be found in the care plan section) Acute Rehab PT  Goals Patient Stated Goal: return home PT Goal Formulation: With patient Time For Goal Achievement: 11/21/18 Potential to Achieve Goals: Good Progress towards PT goals: Progressing toward goals    Frequency    Min 5X/week      PT Plan Frequency needs to be updated    Co-evaluation              AM-PAC PT "6 Clicks" Mobility   Outcome Measure  Help needed turning from your back to your side while in a flat bed without using bedrails?: A Little Help needed moving from lying on your back to sitting on the side of a flat bed without using bedrails?: A Little Help needed moving to and from a bed to a chair (including a wheelchair)?: A Little Help needed standing up from a chair using your arms (e.g., wheelchair or bedside chair)?: A Little Help needed to walk in hospital room?: A Little Help needed climbing 3-5 steps with a railing? : A Lot 6 Click Score: 17    End of Session Equipment Utilized During Treatment: Gait belt;Back brace Activity Tolerance: Patient tolerated treatment well Patient left: in chair;with call bell/phone within reach   PT Visit Diagnosis: Unsteadiness on feet (R26.81);Other abnormalities of gait and mobility (R26.89);Pain Pain - part of body: (back)     Time: 7829-5621 PT Time Calculation (min) (ACUTE ONLY): 18 min  Charges:  $Gait Training: 8-22 mins                     Deniece Ree PT, DPT, CBIS  Supplemental Physical Therapist [place]    Pager [phone] Acute Rehab Office [phone]

## 2018-11-11 ENCOUNTER — Encounter: Payer: Self-pay | Admitting: Specialist

## 2018-11-16 ENCOUNTER — Other Ambulatory Visit: Payer: Self-pay | Admitting: Specialist

## 2018-11-16 MED ORDER — OXYCODONE-ACETAMINOPHEN 5-325 MG/5ML PO SOLN: 7.5000 mL | ORAL | 0 refills | Status: AC | PRN

## 2018-11-17 NOTE — Telephone Encounter (Signed)
Can you call patient and see if she discontinued medication and having any problems. If she has an allergic reaction of that nature she should go to ED for eval.

## 2018-11-17 NOTE — Discharge Summary (Signed)
Patient ID: TONNYA GARBETT MRN: [id number] DOB/AGE: [birth year] 53 y.o.  Admit date: 11/06/2018 Discharge date: 11/17/2018  Admission Diagnoses:  Active Problems:   History of lumbar spinal fusion   Discharge Diagnoses:  Active Problems:   History of lumbar spinal fusion  status post Procedure(s): Explore fusion Lumbar three-four probable transforaminal lumbar interbody fusion with revision of lumbar three bilateral pedicle screws, local bone graft and Vivigen, Extension of fusion to Lumbar five-sacral one with rods, screws and cages  Past Medical History:  Diagnosis Date  . Allergy   . Anemia    Iron deficiency  . Anxiety   . Arthritis    back, left shoulder  . Blood transfusion without reported diagnosis   . Cancer (HCC)    tongue cancer  . Chicken pox   . Depression   . Diarrhea    takes Imodium daily  . GERD (gastroesophageal reflux disease)   . H/O hiatal hernia   . Headache(784.0)   . History of kidney stones    1996ish  . History of radiation therapy 11/16/16- 01/01/17   Base of Tongue/ 66 gy in 33 fractions/ Dose: 2 Gy  . Hypertension   . Migraine    none for 5 years (as of 01/13/16)  . OSA (obstructive sleep apnea) 10/13/2015   unable to get cpap, plans to get one in 2018  . Pneumonia   . Restless legs   . Scoliosis   . Shortness of breath    with exertion  . Sleep apnea     Surgeries: Procedure(s): Explore fusion Lumbar three-four probable transforaminal lumbar interbody fusion with revision of lumbar three bilateral pedicle screws, local bone graft and Vivigen, Extension of fusion to Lumbar five-sacral one with rods, screws and cages on 11/06/2018   Consultants:   Discharged Condition: Improved  Hospital Course: MALOREE UPLINGER is an 53 y.o. female who was admitted 11/06/2018 for operative treatment of lumbar stenosis. Patient failed conservative treatments (please see the history and physical for the specifics) and had severe unremitting  pain that affects sleep, daily activities and work/hobbies. After pre-op clearance, the patient was taken to the operating room on 11/06/2018 and underwent  Procedure(s): Explore fusion Lumbar three-four probable transforaminal lumbar interbody fusion with revision of lumbar three bilateral pedicle screws, local bone graft and Vivigen, Extension of fusion to Lumbar five-sacral one with rods, screws and cages.    Patient was given perioperative antibiotics:  Anti-infectives (From admission, onward)   Start     Dose/Rate Route Frequency Ordered Stop   11/06/18 1700  ceFAZolin (ANCEF) IVPB 2g/100 mL premix     2 g 200 mL/hr over 30 Minutes Intravenous Every 8 hours 11/06/18 1646 11/07/18 0114   11/06/18 0615  ceFAZolin (ANCEF) IVPB 2g/100 mL premix     2 g 200 mL/hr over 30 Minutes Intravenous On call to O.R. 11/06/18 0602 11/06/18 1209       Patient was given sequential compression devices and early ambulation to prevent DVT.   Patient benefited maximally from hospital stay and there were no complications. At the time of discharge, the patient was urinating/moving their bowels without difficulty, tolerating a regular diet, pain is controlled with oral pain medications and they have been cleared by PT/OT.   Recent vital signs: No data found.   Recent laboratory studies: No results for input(s): WBC, HGB, HCT, PLT, NA, K, CL, CO2, BUN, CREATININE, GLUCOSE, INR, CALCIUM in the last 72 hours.  Invalid input(s): PT, 2  Discharge Medications:   Allergies as of 11/09/2018      Reactions   Other Hives, Swelling   tomato   Sulfur Hives, Swelling      Medication List    STOP taking these medications   diclofenac 75 MG EC tablet Commonly known as: VOLTAREN   meloxicam 15 MG tablet Commonly known as: MOBIC     TAKE these medications   amLODipine 10 MG tablet Commonly known as: NORVASC TAKE 1 TABLET BY MOUTH EVERY DAY What changed: how to take this   amphetamine-dextroamphetamine  10 MG tablet Commonly known as: Adderall 1-3 daily as needed for sleepiness What changed:   how much to take  how to take this  when to take this  reasons to take this  additional instructions   aspirin 81 MG chewable tablet Place 81 mg into feeding tube daily.   baclofen 10 MG tablet Commonly known as: LIORESAL PLACE 1 TABLET INTO FEEDING TUBE THREE TIMES DAILY What changed:   how much to take  how to take this  when to take this   diphenoxylate-atropine 2.5-0.025 MG tablet Commonly known as: LOMOTIL Take 2 tablets by mouth 2 (two) times daily. Or via tube What changed:   how to take this  additional instructions   feeding supplement (ENSURE ENLIVE) Liqd Take 237 mLs by mouth 2 (two) times daily between meals.   ferrous sulfate 300 (60 Fe) MG/5ML syrup Place 5 mLs (300 mg total) into feeding tube 2 (two) times daily with a meal. What changed:   medication strength  how much to take  when to take this   folic acid 852 MCG tablet Commonly known as: FOLVITE Place 400 mcg into feeding tube daily.   gabapentin 300 MG capsule Commonly known as: NEURONTIN Take 1 capsule (300 mg total) by mouth 3 (three) times daily. What changed: how to take this   hydrocortisone cream 1 % APPLY EXTERNALLY TO THE AFFECTED AREA TWICE DAILY What changed: See the new instructions.   magic mouthwash w/lidocaine Soln Use 2 teaspoons swish and spit every 4-6 hours prn What changed:   how much to take  how to take this  when to take this  reasons to take this  additional instructions   multivitamin with minerals Tabs tablet Place 1 tablet into feeding tube daily. What changed: Another medication with the same name was added. Make sure you understand how and when to take each.   multivitamin with minerals Tabs tablet Place 1 tablet into feeding tube daily. What changed: You were already taking a medication with the same name, and this prescription was added. Make  sure you understand how and when to take each.   oxyCODONE 15 mg 12 hr tablet Commonly known as: OXYCONTIN Take 1 tablet (15 mg total) by mouth every 12 (twelve) hours for 14 days.   pantoprazole 40 MG tablet Commonly known as: PROTONIX TAKE 1 TABLET BY MOUTH DAILY What changed:   how to take this  when to take this  additional instructions   Potassium 99 MG Tabs 1 tablet (99 mg total) by Gastrostomy Tube route daily. What changed:   how to take this  when to take this   SUMAtriptan 100 MG tablet Commonly known as: Imitrex Take 0.5-1 tablet by mouth at the onset of migraine and may repeat in 2 hours if headache persists or recurs.   traMADol 50 MG tablet Commonly known as: ULTRAM Place 2 tablets (100 mg total) into feeding tube  every 6 (six) hours as needed for moderate pain.       Diagnostic Studies: Dg Lumbar Spine Complete  Result Date: 11/06/2018 CLINICAL DATA:  Surgical posterior fusion of lower lumbar spine. EXAM: DG C-ARM 1-60 MIN; LUMBAR SPINE - COMPLETE 4+ VIEW CONTRAST:  None. FLUOROSCOPY TIME:  Fluoroscopy Time:  1 minutes 7 seconds. Number of Acquired Spot Images: 4. COMPARISON:  August 04, 2017. FINDINGS: Four intraoperative fluoroscopic images of the lower lumbar spine demonstrate the patient be status post surgical posterior fusion of L4-5 and L5-S1 with bilateral intrapedicular screw placement interbody fusion. Interbody fusion of L3-4 is also noted. Good alignment of vertebral bodies is noted. IMPRESSION: Fluoroscopic guidance provided during surgical fusion of lower lumbar spine. Electronically Signed   By: Marijo Conception M.D.   On: 11/06/2018 15:18   Ir Replace G-tube Simple Wo Fluoro  Result Date: 11/02/2018 INDICATION: Patient with history of tongue cancer and chronic gastrostomy tube, who presents with discomfort at insertion site along with worn appearance of tube. Presents today for tube exchange. EXAM: GASTROSTOMY TUBE EXCHANGE MEDICATIONS: None  ANESTHESIA/SEDATION: None CONTRAST:  None FLUOROSCOPY TIME:  None COMPLICATIONS: None immediate. PROCEDURE: Informed written consent was obtained from the patient after a thorough discussion of the procedural risks, benefits and alternatives. All questions were addressed. Maximal Sterile Barrier Technique was utilized including caps, mask, sterile gloves, sterile drape, hand hygiene and skin antiseptic. A timeout was performed prior to the initiation of the procedure. The existing 1 French balloon retention gastrostomy tube was removed and exchanged out for a new 50 French balloon retention gastrostomy tube without immediate complications. 8 cc normal saline was placed into the balloon. Tube secured to skin site. Small amount of granulation tissue noted on the right side of tube insertion site. Silver nitrate applied to this area. IMPRESSION: Successful exchange of existing 52 French balloon retention gastrostomy tube with new 72 French balloon retention gastrostomy tube. Tube is ready for use. Read by: Earley Abide, PA-C Electronically Signed   By: Aletta Edouard M.D.   On: 11/02/2018 16:25   Dg C-arm 1-60 Min  Result Date: 11/06/2018 CLINICAL DATA:  Surgical posterior fusion of lower lumbar spine. EXAM: DG C-ARM 1-60 MIN; LUMBAR SPINE - COMPLETE 4+ VIEW CONTRAST:  None. FLUOROSCOPY TIME:  Fluoroscopy Time:  1 minutes 7 seconds. Number of Acquired Spot Images: 4. COMPARISON:  August 04, 2017. FINDINGS: Four intraoperative fluoroscopic images of the lower lumbar spine demonstrate the patient be status post surgical posterior fusion of L4-5 and L5-S1 with bilateral intrapedicular screw placement interbody fusion. Interbody fusion of L3-4 is also noted. Good alignment of vertebral bodies is noted. IMPRESSION: Fluoroscopic guidance provided during surgical fusion of lower lumbar spine. Electronically Signed   By: Marijo Conception M.D.   On: 11/06/2018 15:18    Discharge Instructions    Call MD / Call 911    Complete by: As directed    If you experience chest pain or shortness of breath, CALL 911 and be transported to the hospital emergency room.  If you develope a fever above 101 F, pus (white drainage) or increased drainage or redness at the wound, or calf pain, call your surgeon's office.   Constipation Prevention   Complete by: As directed    Drink plenty of fluids.  Prune juice may be helpful.  You may use a stool softener, such as Colace (over the counter) 100 mg twice a day.  Use MiraLax (over the counter) for constipation as needed.  Diet - low sodium heart healthy   Complete by: As directed    Discharge instructions   Complete by: As directed    Call if there is increasing drainage, fever greater than 101.5, severe head aches, and worsening nausea or light sensitivity. If shortness of breath, bloody cough or chest tightness or pain go to an emergency room. No lifting greater than 10 lbs. Avoid bending, stooping and twisting. Use brace when sitting and out of bed even to go to bathroom. Walk in house for first 2 weeks then may start to get out slowly increasing distances up to one mile by 4-6 weeks post op. After 5 days may shower and change dressing following bathing with shower.When bathing remove the brace shower and replace brace before getting out of the shower. If drainage, keep dry dressing and do not bathe the incision, use an moisture impervious dressing. Please call and return for scheduled follow up appointment 2 weeks from the time of surgery.   Driving restrictions   Complete by: As directed    No driving for 3 weeks   Increase activity slowly as tolerated   Complete by: As directed    Lifting restrictions   Complete by: As directed    No lifting for 12 weeks      Follow-up Information    Follow up In 2 weeks.   Why: For wound re-check       Jessy Oto, MD Follow up in 2 week(s).   Specialty: Orthopedic Surgery Contact information: [address] [address] [place], [place] Follow up.   Contact information: [place] [address] [phone]           Discharge Plan:  discharge to home  Disposition:     Signed: Benjiman Core  11/17/2018, 10:59 AM

## 2018-11-19 ENCOUNTER — Encounter: Payer: Self-pay | Admitting: Specialist

## 2018-11-20 ENCOUNTER — Other Ambulatory Visit (HOSPITAL_COMMUNITY): Payer: Self-pay | Admitting: Interventional Radiology

## 2018-11-20 DIAGNOSIS — Z431 Encounter for attention to gastrostomy: Secondary | ICD-10-CM

## 2018-11-23 ENCOUNTER — Ambulatory Visit (INDEPENDENT_AMBULATORY_CARE_PROVIDER_SITE_OTHER): Payer: BC Managed Care – PPO | Admitting: Surgery

## 2018-11-23 ENCOUNTER — Encounter: Payer: Self-pay | Admitting: Specialist

## 2018-11-23 ENCOUNTER — Telehealth: Payer: Self-pay | Admitting: Radiology

## 2018-11-23 ENCOUNTER — Encounter: Payer: Self-pay | Admitting: Family Medicine

## 2018-11-23 ENCOUNTER — Ambulatory Visit (HOSPITAL_COMMUNITY)
Admission: RE | Admit: 2018-11-23 | Discharge: 2018-11-23 | Disposition: A | Discharge status: Home / Self Care | Payer: BC Managed Care – PPO | Source: Ambulatory Visit | Attending: Interventional Radiology | Admitting: Interventional Radiology

## 2018-11-23 ENCOUNTER — Encounter: Payer: Self-pay | Admitting: Surgery

## 2018-11-23 ENCOUNTER — Ambulatory Visit: Payer: Self-pay

## 2018-11-23 ENCOUNTER — Other Ambulatory Visit: Payer: Self-pay

## 2018-11-23 DIAGNOSIS — Z981 Arthrodesis status: Secondary | ICD-10-CM

## 2018-11-23 DIAGNOSIS — Z431 Encounter for attention to gastrostomy: Secondary | ICD-10-CM

## 2018-11-23 HISTORY — PX: IR PATIENT EVAL TECH 0-60 MINS: IMG5564

## 2018-11-23 MED ORDER — SILVER NITRATE-POT NITRATE 75-25 % EX MISC
CUTANEOUS | Status: AC
  Filled 2018-11-23: qty 1

## 2018-11-23 MED ORDER — TRAMADOL HCL 50 MG PO TABS: 50.0000 mg | ORAL_TABLET | Freq: Four times a day (QID) | ORAL | 0 refills | Status: DC | PRN

## 2018-11-23 NOTE — Progress Notes (Signed)
Post-Op Visit Note   Patient: Kristen [name]           Date of Birth: 06-19-1965           MRN: [id number] Visit Date: 11/23/2018 PCP: [name], MD   Assessment & Plan:  Chief Complaint:  Chief Complaint  Patient presents with  . Lower Back - Routine Post Op  53 year old female returns.  States that preop pain is greatly improved.  She is doing well.  Compliant with wearing brace.  Needs refill of tramadol.  She is waiting for approval of gabapentin from Dr. Louanne Skye. Visit Diagnoses:  1. S/P lumbar fusion     Plan: Today every other staple was removed and Steri-Strips applied.  We will have patient come back to see me next Wednesday for wound check and possible removal of remaining staples.  She must continue wearing her brace.  Refilled Ultram.  Follow-Up Instructions: Return in about 6 days (around 11/29/2018) for with [facility] for wound check.   Orders:  Orders Placed This Encounter  Procedures  . XR Lumbar Spine 2-3 Views   Meds ordered this encounter  Medications  . traMADol (ULTRAM) 50 MG tablet    Sig: Take 1 tablet (50 mg total) by mouth every 6 (six) hours as needed.    Dispense:  60 tablet    Refill:  0    Imaging: No results found.  PMFS History: Patient Active Problem List   Diagnosis Date Noted  . History of lumbar spinal fusion 11/06/2018  . Acute encephalopathy 05/15/2017  . UTI (urinary tract infection) 05/15/2017  . Hypotension 02/10/2017  . 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 Malignant neoplasm of base of tongue ([place]) 10/29/2016  . 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 Headache associated with sexual activity 05/14/2016  . Tongue lesion 05/14/2016  . Loose stools 03/12/2016  . Hypocalcemia 01/22/2016  . Thrombocytopenia ([place]) 01/22/2016  . Chronic diarrhea 01/22/2016  . Chest pain   . Essential hypertension   . Orthostatic hypotension   . Spinal stenosis of lumbar region 01/20/2016   Class: Chronic  . DDD (degenerative disc disease), lumbar 01/20/2016    Class: Chronic  . Spinal stenosis of lumbar region with neurogenic claudication 01/20/2016  . Low back pain 12/22/2015  . Pre-op examination 12/22/2015  . Hypersomnolence 10/13/2015  . Muscle cramp 08/01/2015  . GERD (gastroesophageal reflux disease) 07/09/2015  . Excessive daytime sleepiness 06/18/2015  . Shingles 08/28/2013  . Postop check 08/28/2013  . Cholelithiasis 07/10/2013  . Colitis 01/08/2013  . Postoperative anemia 01/08/2013  . Morbid obesity with BMI of 40.0-44.9, adult ([place]) 01/08/2013   Past Medical History:  Diagnosis Date  . Allergy   . Anemia    Iron deficiency  . Anxiety   . Arthritis    back, left shoulder  . Blood transfusion without reported diagnosis   . Cancer (HCC)    tongue cancer  . Chicken pox   . Depression   . Diarrhea    takes Imodium daily  . GERD (gastroesophageal reflux disease)   . H/O hiatal hernia   . Headache(784.0)   . History of kidney stones    1996ish  . History of radiation therapy 11/16/16- 01/01/17   Base of Tongue/ 66 gy in 33 fractions/ Dose: 2 Gy  . Hypertension   . Migraine    none for 5 years (as of 01/13/16)  . OSA (obstructive sleep apnea) 10/13/2015  unable to get cpap, plans to get one in 2018  . Pneumonia   . Restless legs   . Scoliosis   . Shortness of breath    with exertion  . Sleep apnea     Family History  Problem Relation Age of Onset  . Heart disease Father   . Heart attack Father   . Hypertension Mother   . Arthritis Mother   . Diabetes Maternal Grandmother   . Diabetes Paternal Grandmother   . Diabetes Maternal Uncle   . Colon polyps Neg Hx   . Colon cancer Neg Hx   . Esophageal cancer Neg Hx   . Stomach cancer Neg Hx   . Rectal cancer Neg Hx     Past Surgical History:  Procedure Laterality Date  . BACK SURGERY  07/21/1978  . CHOLECYSTECTOMY N/A 08/15/2013   Procedure: LAPAROSCOPIC CHOLECYSTECTOMY WITH INTRAOPERATIVE  CHOLANGIOGRAM;  Surgeon: Gwenyth Ober, MD;  Location: [facility];  Service: General;  Laterality: N/A;  . COLONOSCOPY N/A 01/09/2013   Procedure: COLONOSCOPY;  Surgeon: Beryle Beams, MD;  Location: [facility];  Service: Endoscopy;  Laterality: N/A;  . DIRECT LARYNGOSCOPY  07/2016   Dr. Nicolette Bang [facility]  . GASTROSTOMY TUBE PLACEMENT  09/27/2016  . HERNIA REPAIR Left 1981  . IR PATIENT EVAL TECH 0-60 MINS  12/13/2016  . IR PATIENT EVAL TECH 0-60 MINS  04/11/2017  . IR PATIENT EVAL TECH 0-60 MINS  03/24/2018  . IR REPLACE G-TUBE SIMPLE WO FLUORO  12/12/2017  . IR REPLACE G-TUBE SIMPLE WO FLUORO  03/29/2018  . IR REPLACE G-TUBE SIMPLE WO FLUORO  06/29/2018  . IR REPLACE G-TUBE SIMPLE WO FLUORO  11/02/2018  . MODIFIED RADICAL NECK DISSECTION Left 09/27/2016   Levels 1 & 2  . PARTIAL GLOSSECTOMY Left 09/27/2016   Left hemi partial glossectomy  . SPINE SURGERY  01/20/2016   fusion  . TONSILLECTOMY    . tracheotomy  09/27/2016  . TUBAL LIGATION  06/1988   Social History   Occupational History  . Occupation: Air cabin crew  Tobacco Use  . Smoking status: Never Smoker  . Smokeless tobacco: Never Used  Substance and Sexual Activity  . Alcohol use: Yes    Alcohol/week: 7.0 standard drinks    Types: 7 Cans of beer per week  . Drug use: No  . Sexual activity: Yes    Birth control/protection: Surgical   Exam Wound looks good.  No drainage or signs infection.  Today every other staple removed and Steri-Strips applied.

## 2018-11-23 NOTE — Procedures (Signed)
Patient came in today with complaint of large granuloma at g tube insertion site.  She has had the site successfully treated with silver nitrate and has requested it to be treated again.  I applied the silver nitrate generously to the granuloma tissue only.  SHe is aware that the tissue should fall off over the next few days.  She was advised to call back if she needs it retreated since it was a very large area.  Phill Myron PA examine dthe site and agreed with the plan.  Patient is aware of how to contact us.

## 2018-11-23 NOTE — Telephone Encounter (Signed)
I called the pharmacy and they are waiting on Dr.Banks office for approval on the gabapentin, patient is aware

## 2018-11-23 NOTE — Telephone Encounter (Signed)
Patient states she is waiting for gabapentin approval from Dr. Louanne Skye to have Rx refilled. Can you please check on this?  Thanks!

## 2018-11-24 ENCOUNTER — Other Ambulatory Visit: Payer: Self-pay

## 2018-11-24 MED ORDER — GABAPENTIN 300 MG PO CAPS: 300.0000 mg | ORAL_CAPSULE | Freq: Three times a day (TID) | ORAL | 0 refills | Status: DC

## 2018-11-24 NOTE — Telephone Encounter (Signed)
I called [facility], and they are waiting on Dr. Volanda Napoleon office for this--I notified pt of this

## 2018-11-26 ENCOUNTER — Encounter: Payer: Self-pay | Admitting: Family Medicine

## 2018-11-27 ENCOUNTER — Encounter: Payer: Self-pay | Admitting: Specialist

## 2018-11-27 DIAGNOSIS — T84498A Other mechanical complication of other internal orthopedic devices, implants and grafts, initial encounter: Secondary | ICD-10-CM

## 2018-11-27 DIAGNOSIS — M4726 Other spondylosis with radiculopathy, lumbar region: Secondary | ICD-10-CM

## 2018-11-28 ENCOUNTER — Other Ambulatory Visit: Payer: Self-pay | Admitting: Internal Medicine

## 2018-11-28 DIAGNOSIS — K529 Noninfective gastroenteritis and colitis, unspecified: Secondary | ICD-10-CM

## 2018-11-28 MED ORDER — DIPHENOXYLATE-ATROPINE 2.5-0.025 MG PO TABS: 2.0000 | ORAL_TABLET | Freq: Two times a day (BID) | ORAL | 1 refills | Status: DC

## 2018-11-30 ENCOUNTER — Ambulatory Visit (INDEPENDENT_AMBULATORY_CARE_PROVIDER_SITE_OTHER): Payer: BC Managed Care – PPO | Admitting: Internal Medicine

## 2018-11-30 ENCOUNTER — Ambulatory Visit (INDEPENDENT_AMBULATORY_CARE_PROVIDER_SITE_OTHER): Payer: BC Managed Care – PPO | Admitting: Surgery

## 2018-11-30 ENCOUNTER — Other Ambulatory Visit (INDEPENDENT_AMBULATORY_CARE_PROVIDER_SITE_OTHER): Payer: BC Managed Care – PPO

## 2018-11-30 ENCOUNTER — Other Ambulatory Visit: Payer: Self-pay

## 2018-11-30 ENCOUNTER — Encounter: Payer: Self-pay | Admitting: Surgery

## 2018-11-30 VITALS — BP 129/73 | HR 73 | Temp 97.0°F | Ht 69.0 in | Wt 199.0 lb

## 2018-11-30 DIAGNOSIS — D62 Acute posthemorrhagic anemia: Secondary | ICD-10-CM

## 2018-11-30 DIAGNOSIS — Z981 Arthrodesis status: Secondary | ICD-10-CM

## 2018-11-30 DIAGNOSIS — K529 Noninfective gastroenteritis and colitis, unspecified: Secondary | ICD-10-CM

## 2018-11-30 LAB — FERRITIN: Ferritin: 12.6 ng/mL (ref 10.0–291.0)

## 2018-11-30 LAB — CBC
HCT: 29.4 % — ABNORMAL LOW (ref 36.0–46.0)
Hemoglobin: 9.7 g/dL — ABNORMAL LOW (ref 12.0–15.0)
MCHC: 33.1 g/dL (ref 30.0–36.0)
MCV: 82.9 fl (ref 78.0–100.0)
Platelets: 266 10*3/uL (ref 150.0–400.0)
RBC: 3.54 Mil/uL — ABNORMAL LOW (ref 3.87–5.11)
RDW: 16.6 % — ABNORMAL HIGH (ref 11.5–15.5)
WBC: 4 10*3/uL (ref 4.0–10.5)

## 2018-11-30 LAB — IGA: IgA: 84 mg/dL (ref 68–378)

## 2018-11-30 MED ORDER — CEPHALEXIN 500 MG PO CAPS: 500.0000 mg | ORAL_CAPSULE | Freq: Four times a day (QID) | ORAL | 0 refills | Status: DC

## 2018-11-30 NOTE — Patient Instructions (Signed)
Your provider has requested that you go to the basement level for lab work before leaving today. Press "B" on the elevator. The lab is located at the first door on the left as you exit the elevator.   I appreciate the opportunity to care for you. Silvano Rusk, MD, [facility]

## 2018-11-30 NOTE — Progress Notes (Signed)
Kristen [name] 53 y.o. 1965-07-09 [id number]  Assessment & Plan:  Chronic diarrhea Cause still not clear Does not seem pancreatic but ? Labs as below then consider additional Tx Lomoitl works  Orders Placed This Encounter  Procedures  . Tissue transglutaminase, IgA  . IgA  . CBC  . Ferritin     Acute blood loss anemia CBC ferritin       Subjective:   Chief Complaint: chronic diarrhea  HPI Patient is here for follow-up she has a chronic diarrhea of unclear etiology.  Colon biopsies have demonstrated what was thought to be an acute colitis and I thought it might have been prep effect.  She has had diarrhea problems since 2014.  Lomotil controls things.  Infection work-up was negative as well.  She had back surgery and had some blood loss and a transfusion related to that recently.  She says she has been anemic her whole life.  He had a normal hemoglobin prior to her back surgery.  Has a gastrostomy tube for which she takes her medicines.  Due to Covid and her back pain she stopped working and retired and went on disability.  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   Wt Readings from Last 3 Encounters:  11/30/18 199 lb (90.3 kg)  11/06/18 192 lb (87.1 kg)  11/01/18 192 lb 8 oz (87.3 kg)    Allergies  Allergen Reactions  . Other Hives and Swelling    tomato  . Sulfur Hives and Swelling   Current Meds  Medication Sig  . amLODipine (NORVASC) 10 MG tablet TAKE 1 TABLET BY MOUTH EVERY DAY (Patient taking differently: Place 10 mg into feeding tube daily. )  . amphetamine-dextroamphetamine (ADDERALL) 10 MG tablet 1-3 daily as needed for sleepiness (Patient taking differently: Place 10 mg into feeding tube 3 (three) times daily as needed (to stay awake). )  . aspirin 81 MG chewable tablet Place 81 mg into feeding tube daily.   . baclofen (LIORESAL) 10 MG tablet PLACE 1 TABLET INTO FEEDING TUBE THREE TIMES DAILY  (Patient taking differently: Place 10 mg into feeding tube 2 (two) times daily. PLACE 1 TABLET INTO FEEDING TUBE THREE TIMES DAILY)  . cephALEXin (KEFLEX) 500 MG capsule Take 1 capsule (500 mg total) by mouth 4 (four) times daily.  . diphenoxylate-atropine (LOMOTIL) 2.5-0.025 MG tablet Place 2 tablets into feeding tube 2 (two) times daily. Or via tube  . feeding supplement, ENSURE ENLIVE, (ENSURE ENLIVE) LIQD Take 237 mLs by mouth 2 (two) times daily between meals.  . ferrous sulfate 300 (60 Fe) MG/5ML syrup Place 5 mLs (300 mg total) into feeding tube 2 (two) times daily with a meal.  . folic acid (FOLVITE) 527 MCG tablet Place 400 mcg into feeding tube daily.  Marland Kitchen gabapentin (NEURONTIN) 300 MG capsule Take 1 capsule (300 mg total) by mouth 3 (three) times daily.  . hydrocortisone cream 1 % APPLY EXTERNALLY TO THE AFFECTED AREA TWICE DAILY (Patient taking differently: Apply 1 application topically 2 (two) times daily as needed for itching. )  . magic mouthwash w/lidocaine SOLN Use 2 teaspoons swish and spit every 4-6 hours prn (Patient taking differently: Take 10 mLs by mouth every 4 (four) hours as needed for mouth pain. )  . Multiple Vitamin (MULTIVITAMIN WITH MINERALS) TABS tablet Place 1 tablet into feeding tube daily.  . pantoprazole (PROTONIX) 40 MG tablet TAKE 1 TABLET BY MOUTH DAILY (Patient taking differently:  40 mg See admin instructions. Give 40 mg per tube twice daily)  . Potassium 99 MG TABS 1 tablet (99 mg total) by Gastrostomy Tube route daily. (Patient taking differently: Place 99 mg into feeding tube at bedtime. )  . SUMAtriptan (IMITREX) 100 MG tablet Take 0.5-1 tablet by mouth at the onset of migraine and may repeat in 2 hours if headache persists or recurs.  . traMADol (ULTRAM) 50 MG tablet Place 2 tablets (100 mg total) into feeding tube every 6 (six) hours as needed for moderate pain.  . [DISCONTINUED] Multiple Vitamin (MULTIVITAMIN WITH MINERALS) TABS tablet Place 1 tablet into  feeding tube daily.  . [DISCONTINUED] traMADol (ULTRAM) 50 MG tablet Take 1 tablet (50 mg total) by mouth every 6 (six) hours as needed.   Past Medical History:  Diagnosis Date  . Allergy   . Anemia    Iron deficiency  . Anxiety   . Arthritis    back, left shoulder  . Blood transfusion without reported diagnosis   . Cancer (HCC)    tongue cancer  . Chicken pox   . Depression   . Diarrhea    takes Imodium daily  . GERD (gastroesophageal reflux disease)   . H/O hiatal hernia   . Headache(784.0)   . History of kidney stones    1996ish  . History of radiation therapy 11/16/16- 01/01/17   Base of Tongue/ 66 gy in 33 fractions/ Dose: 2 Gy  . Hypertension   . Migraine    none for 5 years (as of 01/13/16)  . OSA (obstructive sleep apnea) 10/13/2015   unable to get cpap, plans to get one in 2018  . Pneumonia   . Restless legs   . Scoliosis   . Shortness of breath    with exertion  . Sleep apnea    Past Surgical History:  Procedure Laterality Date  . BACK SURGERY  07/21/1978  . CHOLECYSTECTOMY N/A 08/15/2013   Procedure: LAPAROSCOPIC CHOLECYSTECTOMY WITH INTRAOPERATIVE CHOLANGIOGRAM;  Surgeon: Gwenyth Ober, MD;  Location: [place];  Service: General;  Laterality: N/A;  . COLONOSCOPY N/A 01/09/2013   Procedure: COLONOSCOPY;  Surgeon: Beryle Beams, MD;  Location: [place];  Service: Endoscopy;  Laterality: N/A;  . DIRECT LARYNGOSCOPY  07/2016   Dr. Nicolette Bang [facility]  . GASTROSTOMY TUBE PLACEMENT  09/27/2016  . HERNIA REPAIR Left 1981  . IR PATIENT EVAL TECH 0-60 MINS  12/13/2016  . IR PATIENT EVAL TECH 0-60 MINS  04/11/2017  . IR PATIENT EVAL TECH 0-60 MINS  03/24/2018  . IR PATIENT EVAL TECH 0-60 MINS  11/23/2018  . IR REPLACE G-TUBE SIMPLE WO FLUORO  12/12/2017  . IR REPLACE G-TUBE SIMPLE WO FLUORO  03/29/2018  . IR REPLACE G-TUBE SIMPLE WO FLUORO  06/29/2018  . IR REPLACE G-TUBE SIMPLE WO FLUORO  11/02/2018  . MODIFIED RADICAL NECK DISSECTION Left 09/27/2016   Levels 1 & 2  .  PARTIAL GLOSSECTOMY Left 09/27/2016   Left hemi partial glossectomy  . SPINE SURGERY  01/20/2016   fusion  . TONSILLECTOMY    . tracheotomy  09/27/2016  . TUBAL LIGATION  06/1988   Social History   Social History Narrative   Fun: Play with her grandkids, read   family history includes Arthritis in her mother; Diabetes in her maternal grandmother, maternal uncle, and paternal grandmother; Heart attack in her father; Heart disease in her father; Hypertension in her mother.   Review of Systems As above  Objective:   Physical  Exam BP 129/73   Pulse 73   Temp (!) 97 F (36.1 C)   Ht 5' 9"  (1.753 m)   Wt 199 lb (90.3 kg)   LMP 11/15/2013   SpO2 98%   BMI 29.39 kg/m   WDWN NAD

## 2018-12-01 LAB — TISSUE TRANSGLUTAMINASE, IGA: (tTG) Ab, IgA: 1 U/mL

## 2018-12-02 ENCOUNTER — Encounter: Payer: Self-pay | Admitting: Internal Medicine

## 2018-12-02 DIAGNOSIS — D62 Acute posthemorrhagic anemia: Secondary | ICD-10-CM | POA: Insufficient documentation

## 2018-12-02 NOTE — Assessment & Plan Note (Signed)
CBC ferritin

## 2018-12-02 NOTE — Assessment & Plan Note (Addendum)
Cause still not clear Does not seem pancreatic but ? Labs as below then consider additional Tx Lomoitl works  Orders Placed This Encounter  Procedures  . Tissue transglutaminase, IgA  . IgA  . CBC  . Ferritin

## 2018-12-07 ENCOUNTER — Ambulatory Visit (INDEPENDENT_AMBULATORY_CARE_PROVIDER_SITE_OTHER): Payer: BC Managed Care – PPO

## 2018-12-07 ENCOUNTER — Other Ambulatory Visit: Payer: Self-pay

## 2018-12-07 ENCOUNTER — Ambulatory Visit (INDEPENDENT_AMBULATORY_CARE_PROVIDER_SITE_OTHER): Payer: BC Managed Care – PPO | Admitting: Specialist

## 2018-12-07 ENCOUNTER — Encounter: Payer: Self-pay | Admitting: Specialist

## 2018-12-07 VITALS — BP 116/81 | HR 78 | Ht 69.0 in | Wt 192.0 lb

## 2018-12-07 DIAGNOSIS — Z981 Arthrodesis status: Secondary | ICD-10-CM

## 2018-12-07 MED ORDER — DICLOFENAC SODIUM 1 % EX GEL: 2.0000 g | Freq: Four times a day (QID) | CUTANEOUS | 1 refills | Status: DC

## 2018-12-07 NOTE — Progress Notes (Signed)
Post-Op Visit Note   Patient: Kristen [name]           Date of Birth: [birth year]           MRN: [id number] Visit Date: 12/07/2018 PCP: [name], MD   Assessment & Plan:4 weeks post extension of thoracolumbar fusion from T7 to L5 to the S1 level with TLIF. Incision is healing without drainage.  Chief Complaint:  Chief Complaint  Patient presents with  . Lower Back - Routine Post Op  Incision is healed well Leg motor is normal   Visit Diagnoses:  1. S/P lumbar fusion     Plan: Avoid frequent bending and stooping  No lifting greater than 10 lbs. May use ice or moist heat for pain. Weight loss is of benefit. Best medication for lumbar disc disease is arthritis medications like motrin, celebrex and naprosyn. Exercise is important to improve your indurance and does allow people to function better inspite of back pain. Start diclofenac gel for the fingers use up to 4 times per day. Warm soaks three times per day.   Follow-Up Instructions: Return in about 4 weeks (around 01/04/2019).   Orders:  Orders Placed This Encounter  Procedures  . XR Lumbar Spine 2-3 Views   No orders of the defined types were placed in this encounter.   Imaging: Xr Lumbar Spine 2-3 Views  Result Date: 12/07/2018 AP and lateral lumbar spine radiographs show pedicle screws and rods fixing L4 to S1 with presence of cages at L3-4, L4-5 and L5-S1 in good position and alignment. No abnormalities noted.    PMFS History: Patient Active Problem List   Diagnosis Date Noted  . Spinal stenosis of lumbar region 01/20/2016    Priority: High    Class: Chronic  . DDD (degenerative disc disease), lumbar 01/20/2016    Priority: High    Class: Chronic  . Acute blood loss anemia 12/02/2018  . Loosening of hardware in spine ([place])   . Other spondylosis with radiculopathy, lumbar region   . History of lumbar spinal fusion 11/06/2018  . UTI (urinary tract infection) 05/15/2017  . Adenoid cystic  carcinoma of head and neck ([place]) 10/29/2016  . Malignant neoplasm of base of tongue ([place]) 10/29/2016  . 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[place]) 10/29/2016  . Headache associated with sexual activity 05/14/2016  . Tongue lesion 05/14/2016  . Thrombocytopenia ([place]) 01/22/2016  . Chronic diarrhea 01/22/2016  . Chest pain   . Essential hypertension   . Spinal stenosis of lumbar region with neurogenic claudication 01/20/2016  . Low back pain 12/22/2015  . Hypersomnolence 10/13/2015  . Muscle cramp 08/01/2015  . GERD (gastroesophageal reflux disease) 07/09/2015  . Excessive daytime sleepiness 06/18/2015  . Cholelithiasis 07/10/2013  . Morbid obesity with BMI of 40.0-44.9, adult ([place]) 01/08/2013   Past Medical History:  Diagnosis Date  . Allergy   . Anemia    Iron deficiency  . Anxiety   . Arthritis    back, left shoulder  . Blood transfusion without reported diagnosis   . Cancer (HCC)    tongue cancer  . Chicken pox   . Depression   . Diarrhea    takes Imodium daily  . GERD (gastroesophageal reflux disease)   . H/O hiatal hernia   . Headache(784.0)   . History of kidney stones    1996ish  . History of radiation therapy 11/16/16- 01/01/17   Base of Tongue/ 66 gy in 33 fractions/ Dose: 2 Gy  . Hypertension   .  Migraine    none for 5 years (as of 01/13/16)  . OSA (obstructive sleep apnea) 10/13/2015   unable to get cpap, plans to get one in 2018  . Pneumonia   . Restless legs   . Scoliosis   . Shingles 08/28/2013  . Shortness of breath    with exertion  . Sleep apnea     Family History  Problem Relation Age of Onset  . Heart disease Father   . Heart attack Father   . Hypertension Mother   . Arthritis Mother   . Diabetes Maternal Grandmother   . Diabetes Paternal Grandmother   . Diabetes Maternal Uncle   . Colon polyps Neg Hx   . Colon cancer Neg Hx   . Esophageal cancer Neg Hx   . Stomach cancer Neg Hx   . Rectal cancer Neg Hx     Past Surgical History:   Procedure Laterality Date  . BACK SURGERY  07/21/1978  . CHOLECYSTECTOMY N/A 08/15/2013   Procedure: LAPAROSCOPIC CHOLECYSTECTOMY WITH INTRAOPERATIVE CHOLANGIOGRAM;  Surgeon: Gwenyth Ober, MD;  Location: [place];  Service: General;  Laterality: N/A;  . COLONOSCOPY N/A 01/09/2013   Procedure: COLONOSCOPY;  Surgeon: Beryle Beams, MD;  Location: [place];  Service: Endoscopy;  Laterality: N/A;  . DIRECT LARYNGOSCOPY  07/2016   Dr. Nicolette Bang [facility]  . GASTROSTOMY TUBE PLACEMENT  09/27/2016  . HERNIA REPAIR Left 1981  . IR PATIENT EVAL TECH 0-60 MINS  12/13/2016  . IR PATIENT EVAL TECH 0-60 MINS  04/11/2017  . IR PATIENT EVAL TECH 0-60 MINS  03/24/2018  . IR PATIENT EVAL TECH 0-60 MINS  11/23/2018  . IR REPLACE G-TUBE SIMPLE WO FLUORO  12/12/2017  . IR REPLACE G-TUBE SIMPLE WO FLUORO  03/29/2018  . IR REPLACE G-TUBE SIMPLE WO FLUORO  06/29/2018  . IR REPLACE G-TUBE SIMPLE WO FLUORO  11/02/2018  . MODIFIED RADICAL NECK DISSECTION Left 09/27/2016   Levels 1 & 2  . PARTIAL GLOSSECTOMY Left 09/27/2016   Left hemi partial glossectomy  . SPINE SURGERY  01/20/2016   fusion  . TONSILLECTOMY    . tracheotomy  09/27/2016  . TUBAL LIGATION  06/1988   Social History   Occupational History  . Occupation: Air cabin crew  Tobacco Use  . Smoking status: Never Smoker  . Smokeless tobacco: Never Used  Substance and Sexual Activity  . Alcohol use: Yes    Alcohol/week: 7.0 standard drinks    Types: 7 Cans of beer per week  . Drug use: No  . Sexual activity: Yes    Birth control/protection: Surgical

## 2018-12-07 NOTE — Patient Instructions (Signed)
Avoid frequent bending and stooping  No lifting greater than 10 lbs. May use ice or moist heat for pain. Weight loss is of benefit. Exercise is important to improve your indurance and does allow people to function better inspite of back pain. Start diclofenac gel for the fingers use up to 4 times per day. Warm soaks three times per day.

## 2018-12-11 ENCOUNTER — Encounter: Payer: Self-pay | Admitting: Internal Medicine

## 2018-12-11 ENCOUNTER — Other Ambulatory Visit: Payer: Self-pay | Admitting: Internal Medicine

## 2018-12-11 DIAGNOSIS — D509 Iron deficiency anemia, unspecified: Secondary | ICD-10-CM

## 2018-12-11 HISTORY — DX: Iron deficiency anemia, unspecified: D50.9

## 2018-12-11 NOTE — Progress Notes (Signed)
I called and explained that her iron levels are low and that she does not have a gluten allergy.  She has liquid iron but wondered if that or a migraine medicine caused some lip swelling.  She saw the sulfate on ferrous sulfate and thought that might of had some effect since she has a sulfa allergy.  I have asked her to retry the liquid iron and to stay on that and to return in the first week of January for a CBC and a ferritin.  Her diarrhea is not explained by any abnormality but does respond to Lomotil so we will continue that.  Question if she had some sort of toxicity to previous cancer treatment or she has some sort of side effect from existing medications.  We will determine follow-up and other treatment as time goes on but right now have decided not to change anything or do additional work-up.

## 2018-12-19 ENCOUNTER — Encounter: Payer: Self-pay | Admitting: Family Medicine

## 2018-12-19 ENCOUNTER — Other Ambulatory Visit: Payer: Self-pay

## 2018-12-19 ENCOUNTER — Telehealth (INDEPENDENT_AMBULATORY_CARE_PROVIDER_SITE_OTHER): Payer: BC Managed Care – PPO | Admitting: Family Medicine

## 2018-12-19 DIAGNOSIS — R252 Cramp and spasm: Secondary | ICD-10-CM | POA: Diagnosis not present

## 2018-12-19 DIAGNOSIS — M79672 Pain in left foot: Secondary | ICD-10-CM

## 2018-12-19 DIAGNOSIS — R600 Localized edema: Secondary | ICD-10-CM

## 2018-12-19 NOTE — Progress Notes (Signed)
Virtual Visit via Video Note  I connected with Vonna Kotyk on 12/19/18 at  3:30 PM EST by a video enabled telemedicine application 2/2 [id number] pandemic and verified that I am speaking with the correct person using two identifiers.  Location patient: home Location provider:work or home office Persons participating in the virtual visit: patient, provider  I discussed the limitations of evaluation and management by telemedicine and the availability of in person appointments. The patient expressed understanding and agreed to proceed.   HPI: Pt with b/l LE x 3 days.  Pt sitting most of the day.  Thought edema was 2/2 beer, so stopped drinking 3 days ago.  Denies SOB, CP, pain in calves or legs.  Pt noticed today her L foot has TTP at MTP joint and is slightly raised.  No pain with movement of toe or erythema at L MTP joint.   Pt states she is not adding extra salt to her food or eating out.  Pt cannot wear tennis shoes 2/2 edema in feet.  Wearing slide on shoes.  Of note, pt rolled her ankle last wk while walking to her mother's house.  Pt still having cramps in calves at night and in the morning.  Otherwise pt doing well.  S/p back surgery.  Wearing back brace.  Pt states her skin has become sensitive to most lotions.   ROS: See pertinent positives and negatives per HPI.  Past Medical History:  Diagnosis Date  . Allergy   . Anemia    Iron deficiency  . Anxiety   . Arthritis    back, left shoulder  . Blood transfusion without reported diagnosis   . Cancer (HCC)    tongue cancer  . Chicken pox   . Depression   . Diarrhea    takes Imodium daily  . GERD (gastroesophageal reflux disease)   . H/O hiatal hernia   . Headache(784.0)   . History of kidney stones    1996ish  . History of radiation therapy 11/16/16- 01/01/17   Base of Tongue/ 66 gy in 33 fractions/ Dose: 2 Gy  . Hypertension   . Iron deficiency anemia 12/11/2018  . Migraine    none for 5 years (as of 01/13/16)  . OSA  (obstructive sleep apnea) 10/13/2015   unable to get cpap, plans to get one in 2018  . Pneumonia   . Restless legs   . Scoliosis   . Shingles 08/28/2013  . Shortness of breath    with exertion  . Sleep apnea     Past Surgical History:  Procedure Laterality Date  . BACK SURGERY  07/21/1978  . CHOLECYSTECTOMY N/A 08/15/2013   Procedure: LAPAROSCOPIC CHOLECYSTECTOMY WITH INTRAOPERATIVE CHOLANGIOGRAM;  Surgeon: Gwenyth Ober, MD;  Location: [place];  Service: General;  Laterality: N/A;  . COLONOSCOPY N/A 01/09/2013   Procedure: COLONOSCOPY;  Surgeon: Beryle Beams, MD;  Location: [place];  Service: Endoscopy;  Laterality: N/A;  . DIRECT LARYNGOSCOPY  07/2016   Dr. Nicolette Bang [facility]  . GASTROSTOMY TUBE PLACEMENT  09/27/2016  . HERNIA REPAIR Left 1981  . IR PATIENT EVAL TECH 0-60 MINS  12/13/2016  . IR PATIENT EVAL TECH 0-60 MINS  04/11/2017  . IR PATIENT EVAL TECH 0-60 MINS  03/24/2018  . IR PATIENT EVAL TECH 0-60 MINS  11/23/2018  . IR REPLACE G-TUBE SIMPLE WO FLUORO  12/12/2017  . IR REPLACE G-TUBE SIMPLE WO FLUORO  03/29/2018  . IR REPLACE G-TUBE SIMPLE WO FLUORO  06/29/2018  . IR  REPLACE G-TUBE SIMPLE WO FLUORO  11/02/2018  . MODIFIED RADICAL NECK DISSECTION Left 09/27/2016   Levels 1 & 2  . PARTIAL GLOSSECTOMY Left 09/27/2016   Left hemi partial glossectomy  . SPINE SURGERY  01/20/2016   fusion  . TONSILLECTOMY    . tracheotomy  09/27/2016  . TUBAL LIGATION  06/1988    Family History  Problem Relation Age of Onset  . Heart disease Father   . Heart attack Father   . Hypertension Mother   . Arthritis Mother   . Diabetes Maternal Grandmother   . Diabetes Paternal Grandmother   . Diabetes Maternal Uncle   . Colon polyps Neg Hx   . Colon cancer Neg Hx   . Esophageal cancer Neg Hx   . Stomach cancer Neg Hx   . Rectal cancer Neg Hx      Current Outpatient Medications:  .  amLODipine (NORVASC) 10 MG tablet, TAKE 1 TABLET BY MOUTH EVERY DAY (Patient taking differently: Place 10  mg into feeding tube daily. ), Disp: 90 tablet, Rfl: 3 .  amphetamine-dextroamphetamine (ADDERALL) 10 MG tablet, 1-3 daily as needed for sleepiness (Patient taking differently: Place 10 mg into feeding tube 3 (three) times daily as needed (to stay awake). ), Disp: 90 tablet, Rfl: 0 .  aspirin 81 MG chewable tablet, Place 81 mg into feeding tube daily. , Disp: , Rfl:  .  baclofen (LIORESAL) 10 MG tablet, PLACE 1 TABLET INTO FEEDING TUBE THREE TIMES DAILY (Patient taking differently: Place 10 mg into feeding tube 2 (two) times daily. PLACE 1 TABLET INTO FEEDING TUBE THREE TIMES DAILY), Disp: 90 tablet, Rfl: 6 .  cephALEXin (KEFLEX) 500 MG capsule, Take 1 capsule (500 mg total) by mouth 4 (four) times daily., Disp: 60 capsule, Rfl: 0 .  diclofenac Sodium (VOLTAREN) 1 % GEL, Apply 2 g topically 4 (four) times daily., Disp: 350 g, Rfl: 1 .  diphenoxylate-atropine (LOMOTIL) 2.5-0.025 MG tablet, Place 2 tablets into feeding tube 2 (two) times daily. Or via tube, Disp: 120 tablet, Rfl: 1 .  feeding supplement, ENSURE ENLIVE, (ENSURE ENLIVE) LIQD, Take 237 mLs by mouth 2 (two) times daily between meals., Disp: 237 mL, Rfl: 12 .  ferrous sulfate 300 (60 Fe) MG/5ML syrup, Place 5 mLs (300 mg total) into feeding tube 2 (two) times daily with a meal., Disp: 150 mL, Rfl: 3 .  folic acid (FOLVITE) 867 MCG tablet, Place 400 mcg into feeding tube daily., Disp: , Rfl:  .  gabapentin (NEURONTIN) 300 MG capsule, Take 1 capsule (300 mg total) by mouth 3 (three) times daily., Disp: 270 capsule, Rfl: 0 .  hydrocortisone cream 1 %, APPLY EXTERNALLY TO THE AFFECTED AREA TWICE DAILY (Patient taking differently: Apply 1 application topically 2 (two) times daily as needed for itching. ), Disp: 30 g, Rfl: 0 .  magic mouthwash w/lidocaine SOLN, Use 2 teaspoons swish and spit every 4-6 hours prn (Patient taking differently: Take 10 mLs by mouth every 4 (four) hours as needed for mouth pain. ), Disp: 150 mL, Rfl: 0 .  Multiple Vitamin  (MULTIVITAMIN WITH MINERALS) TABS tablet, Place 1 tablet into feeding tube daily., Disp: 30 tablet, Rfl: 6 .  pantoprazole (PROTONIX) 40 MG tablet, TAKE 1 TABLET BY MOUTH DAILY (Patient taking differently: 40 mg See admin instructions. Give 40 mg per tube twice daily), Disp: 90 tablet, Rfl: 3 .  Potassium 99 MG TABS, 1 tablet (99 mg total) by Gastrostomy Tube route daily. (Patient taking differently: Place 43  mg into feeding tube at bedtime. ), Disp: 330 tablet, Rfl: 1 .  SUMAtriptan (IMITREX) 100 MG tablet, Take 0.5-1 tablet by mouth at the onset of migraine and may repeat in 2 hours if headache persists or recurs., Disp: 10 tablet, Rfl: 0 .  traMADol (ULTRAM) 50 MG tablet, Place 2 tablets (100 mg total) into feeding tube every 6 (six) hours as needed for moderate pain., Disp: 30 tablet, Rfl: 0  EXAM:  VITALS per patient if applicable:  RR between 12-20 bpm  GENERAL: alert, oriented, appears well and in no acute distress  HEENT: atraumatic, conjunctiva clear, no obvious abnormalities on inspection of external nose and ears  NECK: normal movements of the head and neck  LUNGS: on inspection no signs of respiratory distress, breathing rate appears normal, no obvious gross SOB, gasping or wheezing  CV: no obvious cyanosis.  B/l pitting edema 1+ in feet, trace at knees.  Indention from pt's sandal noted across dorsum of b/l feet.  MS: moves all visible extremities without noticeable abnormality  PSYCH/NEURO: pleasant and cooperative, no obvious depression or anxiety, speech and thought processing grossly intact  ASSESSMENT AND PLAN:  Discussed the following assessment and plan:  Bilateral leg edema -discussed various causes of LE edema  -pt advised to elevate LEs when sitting.  Wear TED hose or compression socks.  Decrease sodium intake -will obtain labs tomorrow.  Consider a few days of low dose lasix based on labs. -given precuations - Plan: Brain Natriuretic Peptide, CMP  Leg  cramps  - Plan: CMP, Magnesium  Left foot pain -possibly 2/2 shoe pressing on foot from edema.  Consider MSK cause due to recently twisting ankle. -ice, elevation, topical analgesic advised. -consider imaging for continued symptoms.  F/u prn   I discussed the assessment and treatment plan with the patient. The patient was provided an opportunity to ask questions and all were answered. The patient agreed with the plan and demonstrated an understanding of the instructions.   The patient was advised to call back or seek an in-person evaluation if the symptoms worsen or if the condition fails to improve as anticipated.   [name], MD

## 2018-12-19 NOTE — Telephone Encounter (Signed)
Pt is scheduled for a virtual appointment with Dr Volanda Napoleon this afternoon at 3.30pm

## 2018-12-19 NOTE — Telephone Encounter (Signed)
Pt new number has already been updated in her chart

## 2018-12-20 ENCOUNTER — Other Ambulatory Visit (INDEPENDENT_AMBULATORY_CARE_PROVIDER_SITE_OTHER): Payer: BC Managed Care – PPO

## 2018-12-20 ENCOUNTER — Telehealth: Payer: Self-pay | Admitting: Family Medicine

## 2018-12-20 DIAGNOSIS — R600 Localized edema: Secondary | ICD-10-CM | POA: Diagnosis not present

## 2018-12-20 DIAGNOSIS — R252 Cramp and spasm: Secondary | ICD-10-CM

## 2018-12-20 LAB — COMPREHENSIVE METABOLIC PANEL
ALT: 13 U/L (ref 0–35)
AST: 16 U/L (ref 0–37)
Albumin: 4.2 g/dL (ref 3.5–5.2)
Alkaline Phosphatase: 126 U/L — ABNORMAL HIGH (ref 39–117)
BUN: 14 mg/dL (ref 6–23)
CO2: 29 mEq/L (ref 19–32)
Calcium: 9.8 mg/dL (ref 8.4–10.5)
Chloride: 106 mEq/L (ref 96–112)
Creatinine, Ser: 0.73 mg/dL (ref 0.40–1.20)
GFR: 100.7 mL/min (ref >60.00)
Glucose, Bld: 84 mg/dL (ref 70–99)
Potassium: 4.3 mEq/L (ref 3.5–5.1)
Sodium: 143 mEq/L (ref 135–145)
Total Bilirubin: 0.7 mg/dL (ref 0.2–1.2)
Total Protein: 6.8 g/dL (ref 6.0–8.3)

## 2018-12-20 LAB — BRAIN NATRIURETIC PEPTIDE: Pro B Natriuretic peptide (BNP): 23 pg/mL (ref 0.0–100.0)

## 2018-12-20 NOTE — Telephone Encounter (Signed)
Pt would like to know if she needs to have the new version of the shingle vaccine since she had it back in 2015.  Please call pt at [phone].

## 2018-12-22 ENCOUNTER — Encounter: Payer: Self-pay | Admitting: Family Medicine

## 2018-12-22 ENCOUNTER — Other Ambulatory Visit: Payer: Self-pay

## 2018-12-22 MED ORDER — FUROSEMIDE 20 MG PO TABS: 20.0000 mg | ORAL_TABLET | Freq: Every day | ORAL | 0 refills | Status: DC

## 2018-12-25 NOTE — Telephone Encounter (Signed)
Pt was given lab results

## 2018-12-27 ENCOUNTER — Other Ambulatory Visit: Payer: Self-pay | Admitting: Specialist

## 2019-01-04 ENCOUNTER — Encounter: Payer: Self-pay | Admitting: Surgery

## 2019-01-04 ENCOUNTER — Ambulatory Visit: Payer: BC Managed Care – PPO | Admitting: Surgery

## 2019-01-04 ENCOUNTER — Ambulatory Visit (INDEPENDENT_AMBULATORY_CARE_PROVIDER_SITE_OTHER): Payer: BC Managed Care – PPO | Admitting: Surgery

## 2019-01-04 ENCOUNTER — Other Ambulatory Visit: Payer: Self-pay

## 2019-01-04 VITALS — BP 126/84 | HR 78 | Wt 204.0 lb

## 2019-01-04 DIAGNOSIS — M4325 Fusion of spine, thoracolumbar region: Secondary | ICD-10-CM

## 2019-01-04 MED ORDER — TRAMADOL HCL 50 MG PO TABS: 50.0000 mg | ORAL_TABLET | Freq: Three times a day (TID) | ORAL | 0 refills | Status: DC | PRN

## 2019-01-04 NOTE — Progress Notes (Signed)
53 year old black female about 2 months out from fusion returns.  States that she is doing well.  Preop symptoms are greatly improved.  She continues to have some spasms in her back but otherwise she is pleased with her surgical result.  She continues to wear brace as instructed.  Exam Surgical incision is well-healed.  She does have some left-sided thoracolumbar spasms.  Ambulating well.  Neurologically intact.  Plan Patient will continue wearing her brace.  Follow-up with Dr. Louanne Skye in 1 month for recheck and he will likely get repeat x-rays at that time.  He will make decision as to whether or not patient can come out of her brace altogether.  We will send in a refill of her Ultram to the pharmacy.

## 2019-01-06 ENCOUNTER — Other Ambulatory Visit: Payer: Self-pay | Admitting: Family Medicine

## 2019-01-08 NOTE — Telephone Encounter (Signed)
Spoke with pt states that the swelling has gone down but wants to have a refill incase the swelling returns, ok to send refill

## 2019-01-09 NOTE — Telephone Encounter (Signed)
Spoke with pt regarding getting a shingles vaccine, pt is aware to call the office back to schedule nurse visit, verbalized understanding

## 2019-02-05 ENCOUNTER — Ambulatory Visit: Payer: Self-pay

## 2019-02-05 ENCOUNTER — Other Ambulatory Visit: Payer: Self-pay

## 2019-02-05 ENCOUNTER — Encounter: Payer: Self-pay | Admitting: Specialist

## 2019-02-05 ENCOUNTER — Ambulatory Visit (INDEPENDENT_AMBULATORY_CARE_PROVIDER_SITE_OTHER): Payer: BC Managed Care – PPO | Admitting: Specialist

## 2019-02-05 VITALS — BP 125/86 | HR 90 | Ht 69.0 in | Wt 204.0 lb

## 2019-02-05 DIAGNOSIS — M180 Bilateral primary osteoarthritis of first carpometacarpal joints: Secondary | ICD-10-CM

## 2019-02-05 DIAGNOSIS — M4325 Fusion of spine, thoracolumbar region: Secondary | ICD-10-CM | POA: Diagnosis not present

## 2019-02-05 NOTE — Progress Notes (Signed)
Post-Op Visit Note   Patient: Kristen [name]           Date of Birth: December 15, 1965           MRN: [id number] Visit Date: 02/05/2019 PCP: [name], MD   Assessment & Plan:3 months post extension of her lumbar fusion to L5-S1. Fusion L5-S1 appears to be Progressing with minimal motion with f/e radiographs, suggesting healing. Complaints of decreased grip strength and painful ROM of the thumb base bilateral .  Chief Complaint:  Chief Complaint  Patient presents with  . Spine - Follow-up  Legs are NV normal  SLR negative.  Visit Diagnoses:  1. Fusion of spine of thoracolumbar region   54 year old female post extension of thoracolumbar fusion to the S1 level. She has mainly compllaints of  Bilateral basal thumb OA pain .  Exam with bilateral positive grind test for the basal joint of both hands, no contracture or deformity.  Plan: Avoid requent bending and stoopin No lifting greater than 10 lbs. May use ice or moist heat for pain. Weight loss is of benefit. Best medication for lumbar disc disease is arthritis medications like motrin, celebrex and naprosyn. Exercise is important to improve your indurance and does allow people to function better inspite of back pain. OT FOR  Therapy for her hands night splinting, iontophoresis Continue with voltaren gel up to Td fID or QID. Hemp CBD may be of benefit transdermal for pain due the arthritis at the base of the thumb.    Injection with steroid may be of benefit. Hemp CBD capsules, amazon.com 5,000-7,000 mg per bottle, 60 capsules per bottle, take one capsule twice a day. Follow-Up Instructions: No follow-ups on file Follow-Up Instructions: No follow-ups on file.   Orders:  Orders Placed This Encounter  Procedures  . XR Lumbar Spine 2-3 Views   No orders of the defined types were placed in this encounter.   Imaging: No results found.  PMFS History: Patient Active Problem List   Diagnosis Date Noted  . Spinal  stenosis of lumbar region 01/20/2016    Priority: High    Class: Chronic  . DDD (degenerative disc disease), lumbar 01/20/2016    Priority: High    Class: Chronic  . Iron deficiency anemia 12/11/2018  . Acute blood loss anemia 12/02/2018  . Loosening of hardware in spine ([place])   . Other spondylosis with radiculopathy, lumbar region   . History of lumbar spinal fusion 11/06/2018  . UTI (urinary tract infection) 05/15/2017  . 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 Malignant neoplasm of base of tongue ([place]) 10/29/2016  . 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[place]) 10/29/2016  . Headache associated with sexual activity 05/14/2016  . Tongue lesion 05/14/2016  . Thrombocytopenia ([place]) 01/22/2016  . Chronic diarrhea 01/22/2016  . Chest pain   . Essential hypertension   . Spinal stenosis of lumbar region with neurogenic claudication 01/20/2016  . Low back pain 12/22/2015  . Hypersomnolence 10/13/2015  . Muscle cramp 08/01/2015  . GERD (gastroesophageal reflux disease) 07/09/2015  . Excessive daytime sleepiness 06/18/2015  . Cholelithiasis 07/10/2013  . Morbid obesity with BMI of 40.0-44.9, adult ([place]) 01/08/2013   Past Medical History:  Diagnosis Date  . Allergy   . Anemia    Iron deficiency  . Anxiety   . Arthritis    back, left shoulder  . Blood transfusion without reported diagnosis   . Cancer (HCC)    tongue cancer  .  Chicken pox   . Depression   . Diarrhea    takes Imodium daily  . GERD (gastroesophageal reflux disease)   . H/O hiatal hernia   . Headache(784.0)   . History of kidney stones    1996ish  . History of radiation therapy 11/16/16- 01/01/17   Base of Tongue/ 66 gy in 33 fractions/ Dose: 2 Gy  . Hypertension   . Iron deficiency anemia 12/11/2018  . Migraine    none for 5 years (as of 01/13/16)  . OSA (obstructive sleep apnea) 10/13/2015   unable to get cpap, plans to get one in 2018  . Pneumonia   . Restless legs   . Scoliosis     . Shingles 08/28/2013  . Shortness of breath    with exertion  . Sleep apnea     Family History  Problem Relation Age of Onset  . Heart disease Father   . Heart attack Father   . Hypertension Mother   . Arthritis Mother   . Diabetes Maternal Grandmother   . Diabetes Paternal Grandmother   . Diabetes Maternal Uncle   . Colon polyps Neg Hx   . Colon cancer Neg Hx   . Esophageal cancer Neg Hx   . Stomach cancer Neg Hx   . Rectal cancer Neg Hx     Past Surgical History:  Procedure Laterality Date  . BACK SURGERY  07/21/1978  . CHOLECYSTECTOMY N/A 08/15/2013   Procedure: LAPAROSCOPIC CHOLECYSTECTOMY WITH INTRAOPERATIVE CHOLANGIOGRAM;  Surgeon: Gwenyth Ober, MD;  Location: [facility];  Service: General;  Laterality: N/A;  . COLONOSCOPY N/A 01/09/2013   Procedure: COLONOSCOPY;  Surgeon: Beryle Beams, MD;  Location: [facility];  Service: Endoscopy;  Laterality: N/A;  . DIRECT LARYNGOSCOPY  07/2016   Dr. Nicolette Bang [facility]  . GASTROSTOMY TUBE PLACEMENT  09/27/2016  . HERNIA REPAIR Left 1981  . IR PATIENT EVAL TECH 0-60 MINS  12/13/2016  . IR PATIENT EVAL TECH 0-60 MINS  04/11/2017  . IR PATIENT EVAL TECH 0-60 MINS  03/24/2018  . IR PATIENT EVAL TECH 0-60 MINS  11/23/2018  . IR REPLACE G-TUBE SIMPLE WO FLUORO  12/12/2017  . IR REPLACE G-TUBE SIMPLE WO FLUORO  03/29/2018  . IR REPLACE G-TUBE SIMPLE WO FLUORO  06/29/2018  . IR REPLACE G-TUBE SIMPLE WO FLUORO  11/02/2018  . MODIFIED RADICAL NECK DISSECTION Left 09/27/2016   Levels 1 & 2  . PARTIAL GLOSSECTOMY Left 09/27/2016   Left hemi partial glossectomy  . SPINE SURGERY  01/20/2016   fusion  . TONSILLECTOMY    . tracheotomy  09/27/2016  . TUBAL LIGATION  06/1988   Social History   Occupational History  . Occupation: Air cabin crew  Tobacco Use  . Smoking status: Never Smoker  . Smokeless tobacco: Never Used  Substance and Sexual Activity  . Alcohol use: Yes    Alcohol/week: 7.0 standard drinks    Types: 7 Cans of beer per week   . Drug use: No  . Sexual activity: Yes    Birth control/protection: Surgical

## 2019-02-05 NOTE — Patient Instructions (Addendum)
Avoid requent bending and stoopin No lifting greater than 10 lbs. May use ice or moist heat for pain. Weight loss is of benefit. Best medication for lumbar disc disease is arthritis medications like motrin, celebrex and naprosyn. Exercise is important to improve your indurance and does allow people to function better inspite of back pain. OT FOR  Therapy, night splinting, iontophoresis Continue with voltaren gel up to Td fID or QID. Hemp CBD may be of benefit transdermal for pain due the arthritis at the base of the thumb.    Injection with steroid may be of benefit. Hemp CBD capsules, amazon.com 5,000-7,000 mg per bottle, 60 capsules per bottle, take one capsule twice a day. Follow-Up Instructions: No follow-ups on file.

## 2019-02-08 ENCOUNTER — Other Ambulatory Visit: Payer: Self-pay | Admitting: Internal Medicine

## 2019-02-08 NOTE — Telephone Encounter (Signed)
Please advise how many refills Sir? Thank you.

## 2019-02-08 NOTE — Telephone Encounter (Signed)
Patient has made an appointment for 03/07/2019 at 11:10 AM.

## 2019-02-08 NOTE — Telephone Encounter (Signed)
Enough until she sees me - should schedule a f/u

## 2019-02-26 ENCOUNTER — Encounter: Payer: Self-pay | Admitting: Family Medicine

## 2019-02-26 ENCOUNTER — Other Ambulatory Visit: Payer: Self-pay | Admitting: Family Medicine

## 2019-03-06 ENCOUNTER — Other Ambulatory Visit (HOSPITAL_COMMUNITY): Payer: Self-pay | Admitting: Radiology

## 2019-03-06 DIAGNOSIS — R633 Feeding difficulties, unspecified: Secondary | ICD-10-CM

## 2019-03-07 ENCOUNTER — Other Ambulatory Visit (INDEPENDENT_AMBULATORY_CARE_PROVIDER_SITE_OTHER): Payer: BC Managed Care – PPO

## 2019-03-07 ENCOUNTER — Ambulatory Visit (INDEPENDENT_AMBULATORY_CARE_PROVIDER_SITE_OTHER): Payer: BC Managed Care – PPO | Admitting: Internal Medicine

## 2019-03-07 ENCOUNTER — Other Ambulatory Visit: Payer: Self-pay

## 2019-03-07 ENCOUNTER — Encounter: Payer: Self-pay | Admitting: Internal Medicine

## 2019-03-07 VITALS — BP 126/84 | HR 72 | Temp 98.8°F | Ht 69.0 in | Wt 208.0 lb

## 2019-03-07 DIAGNOSIS — K529 Noninfective gastroenteritis and colitis, unspecified: Secondary | ICD-10-CM | POA: Diagnosis not present

## 2019-03-07 DIAGNOSIS — D509 Iron deficiency anemia, unspecified: Secondary | ICD-10-CM

## 2019-03-07 LAB — CBC WITH DIFFERENTIAL/PLATELET
Basophils Absolute: 0 10*3/uL (ref 0.0–0.1)
Basophils Relative: 0.2 % (ref 0.0–3.0)
Eosinophils Absolute: 0.2 10*3/uL (ref 0.0–0.7)
Eosinophils Relative: 3.9 % (ref 0.0–5.0)
HCT: 39.8 % (ref 36.0–46.0)
Hemoglobin: 12.9 g/dL (ref 12.0–15.0)
Lymphocytes Relative: 29.6 % (ref 12.0–46.0)
Lymphs Abs: 1.2 10*3/uL (ref 0.7–4.0)
MCHC: 32.4 g/dL (ref 30.0–36.0)
MCV: 87.2 fl (ref 78.0–100.0)
Monocytes Absolute: 0.3 10*3/uL (ref 0.1–1.0)
Monocytes Relative: 7.7 % (ref 3.0–12.0)
Neutro Abs: 2.3 10*3/uL (ref 1.4–7.7)
Neutrophils Relative %: 58.6 % (ref 43.0–77.0)
Platelets: 178 10*3/uL (ref 150.0–400.0)
RBC: 4.56 Mil/uL (ref 3.87–5.11)
RDW: 17 % — ABNORMAL HIGH (ref 11.5–15.5)
WBC: 4 10*3/uL (ref 4.0–10.5)

## 2019-03-07 LAB — FERRITIN: Ferritin: 11.9 ng/mL (ref 10.0–291.0)

## 2019-03-07 MED ORDER — DIPHENOXYLATE-ATROPINE 2.5-0.025 MG PO TABS: 2.0000 | ORAL_TABLET | Freq: Three times a day (TID) | ORAL | 5 refills | Status: DC

## 2019-03-07 NOTE — Patient Instructions (Signed)
  Your provider has requested that you go to the basement level for lab work before leaving today. Press "B" on the elevator. The lab is located at the first door on the left as you exit the elevator.   We have sent the following medications to your pharmacy for you to pick up at your convenience: Lomotil   I appreciate the opportunity to care for you. Silvano Rusk, MD, [facility]

## 2019-03-07 NOTE — Progress Notes (Signed)
Kristen [name] 54 y.o. 07/10/1965 [id number]  Assessment & Plan:  Chronic diarrhea Etiology has not been clear.  She is doing well on 6 Lomotil a day.  Reviewing the chart after she left a CT scan in 2014 suggested inflammatory bowel disease though I did not see anything like that on her colonoscopy, however I did not enter her terminal ileum.  I am going to have her do a CT enterography and if that is negative a capsule endoscopy.  We will contact her.  Iron deficiency anemia Recheck CBC and ferritin today.   CC: [name], MD     Subjective:   Chief Complaint: Diarrhea  HPI Years of chronic diarrhea even before cholecystectomy - she thinks began around 2009 2014 CT suggested IBD - not seen on recent colonoscopy though I did not get into terminal ileum - not sure I was aware of this before  3 Lomotil bid working well for diarrhea  Tolerating liquid FeSO4 via tube  Allergies  Allergen Reactions  . Other Hives and Swelling    tomato  . Sulfur Hives and Swelling   Current Meds  Medication Sig  . amLODipine (NORVASC) 10 MG tablet TAKE 1 TABLET BY MOUTH EVERY DAY (Patient taking differently: Place 10 mg into feeding tube daily. )  . amphetamine-dextroamphetamine (ADDERALL) 10 MG tablet 1-3 daily as needed for sleepiness (Patient taking differently: Place 10 mg into feeding tube 3 (three) times daily as needed (to stay awake). )  . aspirin 81 MG chewable tablet Place 81 mg into feeding tube daily.   . baclofen (LIORESAL) 10 MG tablet PLACE 1 TABLET INTO FEEDING TUBE THREE TIMES DAILY (Patient taking differently: Place 10 mg into feeding tube 2 (two) times daily. PLACE 1 TABLET INTO FEEDING TUBE THREE TIMES DAILY)  . cephALEXin (KEFLEX) 500 MG capsule Take 1 capsule (500 mg total) by mouth 4 (four) times daily.  . diclofenac Sodium (VOLTAREN) 1 % GEL Apply 2 g topically 4 (four) times daily.  . diphenoxylate-atropine (LOMOTIL) 2.5-0.025 MG tablet Place 2 tablets  into feeding tube in the morning, at noon, and at bedtime.  . feeding supplement, ENSURE ENLIVE, (ENSURE ENLIVE) LIQD Take 237 mLs by mouth 2 (two) times daily between meals.  . ferrous sulfate 300 (60 Fe) MG/5ML syrup Place 5 mLs (300 mg total) into feeding tube 2 (two) times daily with a meal.  . folic acid (FOLVITE) 628 MCG tablet Place 400 mcg into feeding tube daily.  . furosemide (LASIX) 20 MG tablet Take 1 tablet (20 mg total) by mouth daily. Take 1 tablet daily for 3 days for leg swelling  . gabapentin (NEURONTIN) 300 MG capsule TAKE 1 CAPSULE(300 MG) BY MOUTH THREE TIMES DAILY  . hydrocortisone cream 1 % APPLY EXTERNALLY TO THE AFFECTED AREA TWICE DAILY (Patient taking differently: Apply 1 application topically 2 (two) times daily as needed for itching. )  . magic mouthwash w/lidocaine SOLN Use 2 teaspoons swish and spit every 4-6 hours prn (Patient taking differently: Take 10 mLs by mouth every 4 (four) hours as needed for mouth pain. )  . meloxicam (MOBIC) 15 MG tablet TAKE 1 TABLET(15 MG) BY MOUTH DAILY  . Multiple Vitamin (MULTIVITAMIN WITH MINERALS) TABS tablet Place 1 tablet into feeding tube daily.  . pantoprazole (PROTONIX) 40 MG tablet TAKE 1 TABLET BY MOUTH DAILY (Patient taking differently: 40 mg See admin instructions. Give 40 mg per tube twice daily)  . Potassium Gluconate 2.5 MEQ TABS 1 tablet by  Gastrostomy Tube route daily.  . SUMAtriptan (IMITREX) 100 MG tablet Take 0.5-1 tablet by mouth at the onset of migraine and may repeat in 2 hours if headache persists or recurs.  . traMADol (ULTRAM) 50 MG tablet Place 2 tablets (100 mg total) into feeding tube every 6 (six) hours as needed for moderate pain.  . [DISCONTINUED] diphenoxylate-atropine (LOMOTIL) 2.5-0.025 MG tablet PLACE 2 TABLETS INTO FEEDING TUBE TWICE DAILY   Past Medical History:  Diagnosis Date  . Allergy   . Anemia    Iron deficiency  . Anxiety   . Arthritis    back, left shoulder  . Blood transfusion  without reported diagnosis   . Chicken pox   . Depression   . Diarrhea    takes Imodium daily  . GERD (gastroesophageal reflux disease)   . H/O hiatal hernia   . Headache(784.0)   . History of kidney stones    1996ish  . History of radiation therapy 11/16/16- 01/01/17   Base of Tongue/ 66 gy in 33 fractions/ Dose: 2 Gy  . Hypertension   . Iron deficiency anemia 12/11/2018  . Migraine    none for 5 years (as of 01/13/16)  . OSA (obstructive sleep apnea) 10/13/2015   unable to get cpap, plans to get one in 2018  . Pneumonia   . Restless legs   . Scoliosis   . Shingles 08/28/2013  . Shortness of breath    with exertion  . Sleep apnea   . Tongue cancer ([place])    tongue cancer   Past Surgical History:  Procedure Laterality Date  . BACK SURGERY  07/21/1978  . CHOLECYSTECTOMY N/A 08/15/2013   Procedure: LAPAROSCOPIC CHOLECYSTECTOMY WITH INTRAOPERATIVE CHOLANGIOGRAM;  Surgeon: Gwenyth Ober, MD;  Location: [facility];  Service: General;  Laterality: N/A;  . COLONOSCOPY N/A 01/09/2013   Procedure: COLONOSCOPY;  Surgeon: Beryle Beams, MD;  Location: [facility];  Service: Endoscopy;  Laterality: N/A;  . DIRECT LARYNGOSCOPY  07/2016   Dr. Nicolette Bang [facility]  . GASTROSTOMY TUBE PLACEMENT  09/27/2016  . HERNIA REPAIR Left 1981  . IR PATIENT EVAL TECH 0-60 MINS  12/13/2016  . IR PATIENT EVAL TECH 0-60 MINS  04/11/2017  . IR PATIENT EVAL TECH 0-60 MINS  03/24/2018  . IR PATIENT EVAL TECH 0-60 MINS  11/23/2018  . IR REPLACE G-TUBE SIMPLE WO FLUORO  12/12/2017  . IR REPLACE G-TUBE SIMPLE WO FLUORO  03/29/2018  . IR REPLACE G-TUBE SIMPLE WO FLUORO  06/29/2018  . IR REPLACE G-TUBE SIMPLE WO FLUORO  11/02/2018  . MODIFIED RADICAL NECK DISSECTION Left 09/27/2016   Levels 1 & 2  . PARTIAL GLOSSECTOMY Left 09/27/2016   Left hemi partial glossectomy  . SPINE SURGERY  01/20/2016   fusion  . TONSILLECTOMY    . tracheotomy  09/27/2016  . 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   Review of Systems As per HPI  Objective:   Physical Exam BP 126/84   Pulse 72   Temp 98.8 F (37.1 C)  Ht 5' 9"  (1.753 m)   Wt 208 lb (94.3 kg)   LMP 11/15/2013   BMI 30.72 kg/m

## 2019-03-07 NOTE — Assessment & Plan Note (Signed)
Etiology has not been clear.  She is doing well on 6 Lomotil a day.  Reviewing the chart after she left a CT scan in 2014 suggested inflammatory bowel disease though I did not see anything like that on her colonoscopy, however I did not enter her terminal ileum.  I am going to have her do a CT enterography and if that is negative a capsule endoscopy.  We will contact her.

## 2019-03-07 NOTE — Assessment & Plan Note (Signed)
Recheck CBC and ferritin today.

## 2019-03-08 ENCOUNTER — Ambulatory Visit (HOSPITAL_COMMUNITY)
Admission: RE | Admit: 2019-03-08 | Discharge: 2019-03-08 | Disposition: A | Discharge status: Home / Self Care | Payer: BC Managed Care – PPO | Source: Ambulatory Visit | Attending: Radiology | Admitting: Radiology

## 2019-03-08 ENCOUNTER — Other Ambulatory Visit: Payer: Self-pay

## 2019-03-08 DIAGNOSIS — Z431 Encounter for attention to gastrostomy: Secondary | ICD-10-CM | POA: Insufficient documentation

## 2019-03-08 DIAGNOSIS — R633 Feeding difficulties, unspecified: Secondary | ICD-10-CM

## 2019-03-08 HISTORY — PX: IR REPLACE G-TUBE SIMPLE WO FLUORO: IMG2323

## 2019-03-08 MED ORDER — SILVER NITRATE-POT NITRATE 75-25 % EX MISC
CUTANEOUS | Status: AC
  Filled 2019-03-08: qty 10

## 2019-03-08 NOTE — Procedures (Signed)
PROCEDURE SUMMARY:  Successful replacement of 18 Fr balloon retention gastrostomy tube. 7 cc NS placed into balloon. Tube secured to skin site. Silver nitrate applied to granuloma located on the left side of tube insertion site. Patient tolerated procedure well. No immediate complications. EBL = 0 mL.  Please see imaging section of Epic for full dictation.   Bea Graff Kelwin Gibler, PA-C 03/08/2019, 2:32 PM

## 2019-03-12 ENCOUNTER — Encounter: Payer: Self-pay | Admitting: Family Medicine

## 2019-03-12 ENCOUNTER — Other Ambulatory Visit: Payer: Self-pay

## 2019-03-12 ENCOUNTER — Ambulatory Visit (INDEPENDENT_AMBULATORY_CARE_PROVIDER_SITE_OTHER): Payer: BC Managed Care – PPO | Admitting: Internal Medicine

## 2019-03-12 ENCOUNTER — Encounter: Payer: Self-pay | Admitting: Internal Medicine

## 2019-03-12 DIAGNOSIS — G4719 Other hypersomnia: Secondary | ICD-10-CM

## 2019-03-12 DIAGNOSIS — R609 Edema, unspecified: Secondary | ICD-10-CM | POA: Diagnosis not present

## 2019-03-12 DIAGNOSIS — R600 Localized edema: Secondary | ICD-10-CM | POA: Insufficient documentation

## 2019-03-12 MED ORDER — AMPHETAMINE-DEXTROAMPHETAMINE 10 MG PO TABS: ORAL_TABLET | ORAL | 0 refills | Status: DC

## 2019-03-12 NOTE — Progress Notes (Signed)
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, schoolbus driver, followed for excessive daytime somnolence, complicated by history carcinoma oropharynx/ SGY/XRT, G-tube(for meds only), degenerative disc disease, lumbar spinal stenosis, chronic diarrhea/colitis, HBP, GERD, Adderall 10 mg, 1-3 daily -----f/u for hypersomnolence, pt reports no new or worsening symptoms except for occasionally falling asleep during the day Comfortable and satisfied with Adderall-"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 Breathing is at patient's baseline.  Retired from bus driving. Despite  Repeated spine surgery, she can't tolerate bumping up and down with driving the bus. Applied for disability. Notes increased heart burn- treated.   Notes swelling in feet - has appointment to see PCP.  ROS-see HPI   + = positive Constitutional:    weight loss, night sweats,  fevers, chills, +fatigue, lassitude. HEENT:    headaches, difficulty swallowing, tooth/dental problems, sore throat,       sneezing, itching, ear ache, nasal congestion, post nasal drip, snoring CV:    chest pain, orthopnea, PND, +swelling in lower extremities, anasarca,                                                    dizziness, palpitations Resp:   shortness of breath with exertion or at rest.                productive cough,   non-productive cough, coughing up of blood.              change in color of mucus.  wheezing.   Skin:    rash or lesions. GI:    + heartburn, indigestion, abdominal pain, nausea, vomiting, diarrhea,                 change in bowel habits, loss of appetite GU: dysuria, change in color of urine, no urgency or frequency.   flank pain. MS:   joint pain, stiffness, decreased range of motion, back pain. Neuro-     nothing unusual Psych:  change in mood or affect.  depression or anxiety.   memory loss.  OBJ- Physical Exam General- Alert, Oriented, Affect-appropriate, Distress- none acute,  Skin- rash-none, lesions- none, excoriation- none Lymphadenopathy- none Head- atraumatic            Eyes- Gross vision intact, PERRLA, conjunctivae and secretions clear            Ears- Hearing, canals-normal            Nose- Clear, no-Septal dev, mucus, polyps, erosion, perforation             Throat- Mallampati III-IV , mucosa clear ,  drainage- none, tonsils- atrophic, + edentulous                       +speech lightly slurred- chronic Neck- flexible , trachea midline, no stridor , thyroid nl, carotid no bruit Chest - symmetrical excursion , unlabored           Heart/CV- RRR , no murmur , no gallop  , no rub, nl s1 s2                           - JVD- none , edema+2-3, stasis changes- none, varices- none           Lung- clear to P&A, wheeze- none, cough- none , dullness-none, rub- none           Chest wall- + postsurgical scars Abd-   + Gastric feeding tube Br/ Gen/ Rectal- Not  done, not indicated Extrem- cyanosis- none, clubbing, none, atrophy- none, strength- nl, + brace L wrist Neuro- grossly intact to observation

## 2019-03-12 NOTE — Patient Instructions (Addendum)
Script sent to refill Adderall  Please see your primary doctor about your swelling feet  Please call us if we can help

## 2019-03-12 NOTE — Assessment & Plan Note (Signed)
Pattern is stable, as is use of adderall with no concerns evident.  She can nap if needed since she is out of work.

## 2019-03-12 NOTE — Assessment & Plan Note (Signed)
Obvious at this visit. Renal function was normal in December. Plan- she will see PCP.

## 2019-03-14 ENCOUNTER — Other Ambulatory Visit: Payer: Self-pay | Admitting: *Deleted

## 2019-03-14 DIAGNOSIS — D509 Iron deficiency anemia, unspecified: Secondary | ICD-10-CM

## 2019-03-14 DIAGNOSIS — K529 Noninfective gastroenteritis and colitis, unspecified: Secondary | ICD-10-CM

## 2019-03-14 DIAGNOSIS — K50918 Crohn's disease, unspecified, with other complication: Secondary | ICD-10-CM

## 2019-03-15 ENCOUNTER — Telehealth: Payer: Self-pay | Admitting: Internal Medicine

## 2019-03-15 NOTE — Telephone Encounter (Signed)
Patient returned your call from yesterday, please call patient one more time.

## 2019-03-15 NOTE — Telephone Encounter (Signed)
See result note.  

## 2019-03-16 ENCOUNTER — Encounter: Payer: Self-pay | Admitting: Family Medicine

## 2019-03-16 ENCOUNTER — Ambulatory Visit (HOSPITAL_COMMUNITY)
Admission: EM | Admit: 2019-03-16 | Discharge: 2019-03-16 | Disposition: A | Discharge status: Home / Self Care | Payer: BC Managed Care – PPO

## 2019-03-16 ENCOUNTER — Other Ambulatory Visit: Payer: Self-pay

## 2019-03-16 ENCOUNTER — Encounter (HOSPITAL_COMMUNITY): Payer: Self-pay | Admitting: Emergency Medicine

## 2019-03-16 DIAGNOSIS — S61011A Laceration without foreign body of right thumb without damage to nail, initial encounter: Secondary | ICD-10-CM | POA: Diagnosis not present

## 2019-03-16 DIAGNOSIS — M79644 Pain in right finger(s): Secondary | ICD-10-CM

## 2019-03-16 NOTE — Telephone Encounter (Signed)
FYI Spoke with pt advised to go to UC so a Dr can look at the cut for any infection

## 2019-03-16 NOTE — ED Triage Notes (Signed)
Pt states about a week ago she was peeling potatoes and cut the top of her R thumb. States its not healing as fast as she would like. Last tetanus 5 years ago.

## 2019-03-16 NOTE — Discharge Instructions (Signed)
If the wound starts to drain, becomes red and very painful, then return to our clinic as you would need antibiotics at least at that point.

## 2019-03-16 NOTE — ED Provider Notes (Signed)
[facility]   MRN: [id number] DOB: 1965/10/14  Subjective:   Kristen [name] is a 54 y.o. female presenting for 1 week hx of suffering a right thumb laceration while cutting a potato. She has had a difficult time healing. She is right handed, has not been able to rest her hand, does a lot of washing at home. Denies fever, redness, drainage of pus or bleeding. Tdap was last updated 5 years ago.   No current facility-administered medications for this encounter.  Current Outpatient Medications:  .  amLODipine (NORVASC) 10 MG tablet, TAKE 1 TABLET BY MOUTH EVERY DAY (Patient taking differently: Place 10 mg into feeding tube daily. ), Disp: 90 tablet, Rfl: 3 .  amphetamine-dextroamphetamine (ADDERALL) 10 MG tablet, 1-3 daily as needed for sleepiness, Disp: 90 tablet, Rfl: 0 .  aspirin 81 MG chewable tablet, Place 81 mg into feeding tube daily. , Disp: , Rfl:  .  baclofen (LIORESAL) 10 MG tablet, PLACE 1 TABLET INTO FEEDING TUBE THREE TIMES DAILY (Patient taking differently: Place 10 mg into feeding tube 2 (two) times daily. PLACE 1 TABLET INTO FEEDING TUBE THREE TIMES DAILY), Disp: 90 tablet, Rfl: 6 .  diclofenac (VOLTAREN) 75 MG EC tablet, Take 75 mg by mouth 2 (two) times daily as needed., Disp: , Rfl:  .  diclofenac Sodium (VOLTAREN) 1 % GEL, Apply 2 g topically 4 (four) times daily., Disp: 350 g, Rfl: 1 .  diphenoxylate-atropine (LOMOTIL) 2.5-0.025 MG tablet, Place 2 tablets into feeding tube in the morning, at noon, and at bedtime., Disp: 180 tablet, Rfl: 5 .  ferrous sulfate 300 (60 Fe) MG/5ML syrup, Place 5 mLs (300 mg total) into feeding tube 2 (two) times daily with a meal., Disp: 150 mL, Rfl: 3 .  folic acid (FOLVITE) 244 MCG tablet, Place 400 mcg into feeding tube daily., Disp: , Rfl:  .  furosemide (LASIX) 20 MG tablet, Take 1 tablet (20 mg total) by mouth daily. Take 1 tablet daily for 3 days for leg swelling, Disp: 3 tablet, Rfl: 0 .  gabapentin (NEURONTIN) 300 MG capsule,  TAKE 1 CAPSULE(300 MG) BY MOUTH THREE TIMES DAILY, Disp: 270 capsule, Rfl: 0 .  hydrocortisone cream 1 %, APPLY EXTERNALLY TO THE AFFECTED AREA TWICE DAILY (Patient taking differently: Apply 1 application topically 2 (two) times daily as needed for itching. ), Disp: 30 g, Rfl: 0 .  magic mouthwash w/lidocaine SOLN, Use 2 teaspoons swish and spit every 4-6 hours prn (Patient taking differently: Take 10 mLs by mouth every 4 (four) hours as needed for mouth pain. ), Disp: 150 mL, Rfl: 0 .  meloxicam (MOBIC) 15 MG tablet, TAKE 1 TABLET(15 MG) BY MOUTH DAILY, Disp: 30 tablet, Rfl: 2 .  Multiple Vitamin (MULTIVITAMIN WITH MINERALS) TABS tablet, Place 1 tablet into feeding tube daily., Disp: 30 tablet, Rfl: 6 .  pantoprazole (PROTONIX) 40 MG tablet, TAKE 1 TABLET BY MOUTH DAILY (Patient taking differently: 40 mg See admin instructions. Give 40 mg per tube twice daily), Disp: 90 tablet, Rfl: 3 .  Potassium Gluconate 2.5 MEQ TABS, 1 tablet by Gastrostomy Tube route daily., Disp: , Rfl:  .  SUMAtriptan (IMITREX) 100 MG tablet, Take 0.5-1 tablet by mouth at the onset of migraine and may repeat in 2 hours if headache persists or recurs., Disp: 10 tablet, Rfl: 0 .  traMADol (ULTRAM) 50 MG tablet, Place 2 tablets (100 mg total) into feeding tube every 6 (six) hours as needed for moderate pain., Disp: 30 tablet, Rfl:  0   Allergies  Allergen Reactions  . Other Hives and Swelling    tomato  . Sulfur Hives and Swelling    Past Medical History:  Diagnosis Date  . Allergy   . Anemia    Iron deficiency  . Anxiety   . Arthritis    back, left shoulder  . Blood transfusion without reported diagnosis   . Chicken pox   . Depression   . Diarrhea    takes Imodium daily  . GERD (gastroesophageal reflux disease)   . H/O hiatal hernia   . Headache(784.0)   . History of kidney stones    1996ish  . History of radiation therapy 11/16/16- 01/01/17   Base of Tongue/ 66 gy in 33 fractions/ Dose: 2 Gy  . Hypertension    . Iron deficiency anemia 12/11/2018  . Migraine    none for 5 years (as of 01/13/16)  . OSA (obstructive sleep apnea) 10/13/2015   unable to get cpap, plans to get one in 2018  . Pneumonia   . Restless legs   . Scoliosis   . Shingles 08/28/2013  . Shortness of breath    with exertion  . Sleep apnea   . Tongue cancer ([place])    tongue cancer     Past Surgical History:  Procedure Laterality Date  . BACK SURGERY  07/21/1978  . CHOLECYSTECTOMY N/A 08/15/2013   Procedure: LAPAROSCOPIC CHOLECYSTECTOMY WITH INTRAOPERATIVE CHOLANGIOGRAM;  Surgeon: Gwenyth Ober, MD;  Location: [facility];  Service: General;  Laterality: N/A;  . COLONOSCOPY N/A 01/09/2013   Procedure: COLONOSCOPY;  Surgeon: Beryle Beams, MD;  Location: [facility];  Service: Endoscopy;  Laterality: N/A;  . COLONOSCOPY  03/2018  . DIRECT LARYNGOSCOPY  07/2016   Dr. Nicolette Bang [facility]  . ESOPHAGOGASTRODUODENOSCOPY  03/2018  . GASTROSTOMY TUBE PLACEMENT  09/27/2016  . HERNIA REPAIR Left 1981  . IR PATIENT EVAL TECH 0-60 MINS  12/13/2016  . IR PATIENT EVAL TECH 0-60 MINS  04/11/2017  . IR PATIENT EVAL TECH 0-60 MINS  03/24/2018  . IR PATIENT EVAL TECH 0-60 MINS  11/23/2018  . IR REPLACE G-TUBE SIMPLE WO FLUORO  12/12/2017  . IR REPLACE G-TUBE SIMPLE WO FLUORO  03/29/2018  . IR REPLACE G-TUBE SIMPLE WO FLUORO  06/29/2018  . IR REPLACE G-TUBE SIMPLE WO FLUORO  11/02/2018  . IR REPLACE G-TUBE SIMPLE WO FLUORO  03/08/2019  . MODIFIED RADICAL NECK DISSECTION Left 09/27/2016   Levels 1 & 2  . PARTIAL GLOSSECTOMY Left 09/27/2016   Left hemi partial glossectomy  . SPINE SURGERY  01/20/2016   fusion  . TONSILLECTOMY    . tracheotomy  09/27/2016  . TUBAL LIGATION  06/1988    Family History  Problem Relation Age of Onset  . Heart disease Father   . Heart attack Father   . Hypertension Mother   . Arthritis Mother   . Diabetes Maternal Grandmother   . Diabetes Paternal Grandmother   . Diabetes Maternal Uncle   . Colon polyps Neg Hx   .  Colon cancer Neg Hx   . Esophageal cancer Neg Hx   . Stomach cancer Neg Hx   . Rectal cancer Neg Hx     Social History   Tobacco Use  . Smoking status: Never Smoker  . Smokeless tobacco: Never Used  Substance Use Topics  . Alcohol use: Yes    Alcohol/week: 7.0 standard drinks    Types: 7 Cans of beer per week  . Drug use:  No    ROS   Objective:   Vitals: BP 128/80   Pulse 77   Temp 98.9 F (37.2 C)   Resp 18   LMP 11/15/2013   SpO2 95%   Physical Exam Constitutional:      General: She is not in acute distress.    Appearance: Normal appearance. She is well-developed. She is not ill-appearing, toxic-appearing or diaphoretic.  HENT:     Head: Normocephalic and atraumatic.     Nose: Nose normal.     Mouth/Throat:     Mouth: Mucous membranes are moist.     Pharynx: Oropharynx is clear.  Eyes:     General: No scleral icterus.    Extraocular Movements: Extraocular movements intact.     Pupils: Pupils are equal, round, and reactive to light.  Cardiovascular:     Rate and Rhythm: Normal rate.  Pulmonary:     Effort: Pulmonary effort is normal.  Musculoskeletal:       Arms:  Skin:    General: Skin is warm and dry.  Neurological:     General: No focal deficit present.     Mental Status: She is alert and oriented to person, place, and time.  Psychiatric:        Mood and Affect: Mood normal.        Behavior: Behavior normal.        Thought Content: Thought content normal.        Judgment: Judgment normal.       Assessment and Plan :   1. Laceration of right thumb without damage to nail, foreign body presence unspecified, initial encounter   2. Finger pain, right     Physical exam findings reassuring against infection as their is no drainage, tenderness, erythema, swelling. Counseled on wound care and need for rest, maintaining her wound dry. Wound is not amenable to laceration repair given age of wound. Use supportive care otherwise, keep wound covered.  Counseled patient on potential for adverse effects with medications prescribed/recommended today, ER and return-to-clinic precautions discussed, patient verbalized understanding.    Jaynee Eagles, [place] 03/16/19 1859

## 2019-03-22 ENCOUNTER — Ambulatory Visit (HOSPITAL_COMMUNITY)
Admission: RE | Admit: 2019-03-22 | Discharge: 2019-03-22 | Disposition: A | Discharge status: Home / Self Care | Payer: BC Managed Care – PPO | Source: Ambulatory Visit | Attending: Internal Medicine | Admitting: Internal Medicine

## 2019-03-22 ENCOUNTER — Other Ambulatory Visit: Payer: Self-pay

## 2019-03-22 DIAGNOSIS — K529 Noninfective gastroenteritis and colitis, unspecified: Secondary | ICD-10-CM | POA: Diagnosis present

## 2019-03-22 DIAGNOSIS — D509 Iron deficiency anemia, unspecified: Secondary | ICD-10-CM

## 2019-03-22 DIAGNOSIS — K50918 Crohn's disease, unspecified, with other complication: Secondary | ICD-10-CM | POA: Diagnosis present

## 2019-03-22 MED ORDER — SODIUM CHLORIDE (PF) 0.9 % IJ SOLN
INTRAMUSCULAR | Status: AC
  Filled 2019-03-22: qty 50

## 2019-03-22 MED ORDER — BARIUM SULFATE 0.1 % PO SUSP
ORAL | Status: AC
  Filled 2019-03-22: qty 3

## 2019-03-22 MED ORDER — IOHEXOL 300 MG/ML  SOLN
100.0000 mL | Freq: Once | INTRAMUSCULAR | Status: AC | PRN
  Administered 2019-03-22: 100 mL via INTRAVENOUS

## 2019-03-23 ENCOUNTER — Telehealth: Payer: Self-pay | Admitting: Internal Medicine

## 2019-03-23 NOTE — Telephone Encounter (Signed)
Pt requested to speak to an RN regarding CT that she had 03/22/19.

## 2019-03-23 NOTE — Telephone Encounter (Signed)
Spoke to the patient who was upset that the rad tech told her she had been diagnosed with Crohn's disease. I told the patient Dr. Carlean Purl wanted to check for evidence of Crohn's due to her continued diarrhea. The patient was told Dr. Carlean Purl would review her imaging when he returned to the office next week.

## 2019-03-29 NOTE — Progress Notes (Signed)
Kristen [name] presents for follow up of radiation completed 01/01/2017 to her Tongue.    Pain issues, if any: She reports chronic back pain.  Using a feeding tube?: Yes, she uses for medicine only.  Weight changes, if any:  Wt Readings from Last 3 Encounters:  03/30/19 199 lb 2 oz (90.3 kg)  03/12/19 209 lb (94.8 kg)  03/07/19 208 lb (94.3 kg)   Swallowing issues, if any: She is eating softer foods like creamed potatoes, and macaroni and cheese. She is able to eat chicken if boiled or baked. She has difficulty with hambuger and tougher meats. She will become "choked" with tougher foods such has hot dogs. She does drink boost occasionally.  Smoking or chewing tobacco? No Using fluoride trays daily? N/A Last ENT visit was on: 10/31/18 Dr. 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 Pathology with 4.6 cm tumor, negative but questionable margins, no LVI, +PNI, 0/20 nodes. She underwent postop RT , completed in Dec 2018 in [place]  There is no evidence of disease on exam today   Other notable issues, if any:   BP (!) 140/98 (BP Location: Left Arm, Patient Position: Sitting)   Pulse 74   Temp 98.3 F (36.8 C) (Temporal)   Resp 18   Ht 5' 9"  (1.753 m)   Wt 199 lb 2 oz (90.3 kg)   LMP 11/15/2013   SpO2 97%   BMI 29.41 kg/m

## 2019-03-30 ENCOUNTER — Other Ambulatory Visit: Payer: Self-pay

## 2019-03-30 ENCOUNTER — Ambulatory Visit
Admission: RE | Admit: 2019-03-30 | Discharge: 2019-03-30 | Disposition: A | Discharge status: Home / Self Care | Payer: BC Managed Care – PPO | Source: Ambulatory Visit | Attending: Radiation Oncology | Admitting: Radiation Oncology

## 2019-03-30 ENCOUNTER — Encounter: Payer: Self-pay | Admitting: Radiation Oncology

## 2019-03-30 VITALS — BP 140/98 | HR 74 | Temp 98.3°F | Resp 18 | Ht 69.0 in | Wt 199.1 lb

## 2019-03-30 DIAGNOSIS — N200 Calculus of kidney: Secondary | ICD-10-CM | POA: Diagnosis not present

## 2019-03-30 DIAGNOSIS — C01 Malignant neoplasm of base of tongue: Secondary | ICD-10-CM

## 2019-03-30 DIAGNOSIS — R634 Abnormal weight loss: Secondary | ICD-10-CM | POA: Insufficient documentation

## 2019-03-30 DIAGNOSIS — D509 Iron deficiency anemia, unspecified: Secondary | ICD-10-CM | POA: Diagnosis not present

## 2019-03-30 DIAGNOSIS — Z791 Long term (current) use of non-steroidal anti-inflammatories (NSAID): Secondary | ICD-10-CM | POA: Insufficient documentation

## 2019-03-30 DIAGNOSIS — N2889 Other specified disorders of kidney and ureter: Secondary | ICD-10-CM | POA: Diagnosis not present

## 2019-03-30 DIAGNOSIS — Z933 Colostomy status: Secondary | ICD-10-CM | POA: Diagnosis not present

## 2019-03-30 DIAGNOSIS — K5730 Diverticulosis of large intestine without perforation or abscess without bleeding: Secondary | ICD-10-CM | POA: Insufficient documentation

## 2019-03-30 DIAGNOSIS — Z85819 Personal history of malignant neoplasm of unspecified site of lip, oral cavity, and pharynx: Secondary | ICD-10-CM | POA: Insufficient documentation

## 2019-03-30 DIAGNOSIS — C760 Malignant neoplasm of head, face and neck: Secondary | ICD-10-CM

## 2019-03-30 DIAGNOSIS — Z7982 Long term (current) use of aspirin: Secondary | ICD-10-CM | POA: Diagnosis not present

## 2019-03-30 DIAGNOSIS — Z79899 Other long term (current) drug therapy: Secondary | ICD-10-CM | POA: Insufficient documentation

## 2019-03-30 DIAGNOSIS — R197 Diarrhea, unspecified: Secondary | ICD-10-CM | POA: Diagnosis not present

## 2019-03-30 LAB — TSH: TSH: 1.697 u[IU]/mL (ref 0.308–3.960)

## 2019-03-30 NOTE — Progress Notes (Signed)
Radiation Oncology         [phone]) [phone] ________________________________  Name: Kristen [name] MRN: [id number]  Date: 03/30/2019  DOB: December 12, 1965  Follow-Up Visit Note in person  CC: [name], MD  Francina Ames, MD  Diagnosis and Prior Radiotherapy:       ICD-10-CM   1. Malignant neoplasm of base of tongue ([place])  C01 TSH  2. 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  11/16/2016 - 01/01/2017: Tongue / 66 Gy in 33 fractions.  CHIEF COMPLAINT:  Here for follow-up and surveillance of head and neck cancer  Narrative:  The patient returns today for routine follow-up.   Pain issues, if any: She reports chronic back pain.  History of back surgery. Using a feeding tube?: Yes, she uses for medicine only.  Weight changes, if any:  Wt Readings from Last 3 Encounters:  03/30/19 199 lb 2 oz (90.3 kg)  03/12/19 209 lb (94.8 kg)  03/07/19 208 lb (94.3 kg)   Swallowing issues, if any: She is eating softer foods like creamed potatoes, and macaroni and cheese. She is able to eat chicken if boiled or baked. She has difficulty with hambuger and tougher meats. She will become "choked" with tougher foods such has hot dogs. She does drink boost occasionally.  Smoking or chewing tobacco? No Using fluoride trays daily? N/A Last ENT visit was on: 10/31/18 Dr. Nicolette Bang with no evidence of disease  She denies any new issues.  No new lumps or bumps in the neck.  She plans to get a Covid shot when group four is eligible.   ALLERGIES:  is allergic to other and sulfur.  Meds: Current Outpatient Medications  Medication Sig Dispense Refill  . amLODipine (NORVASC) 10 MG tablet TAKE 1 TABLET BY MOUTH EVERY DAY (Patient taking differently: Place 10 mg into feeding tube daily. ) 90 tablet 3  . amphetamine-dextroamphetamine (ADDERALL) 10 MG tablet 1-3 daily as needed for  sleepiness 90 tablet 0  . aspirin 81 MG chewable tablet Place 81 mg into feeding tube daily.     . baclofen (LIORESAL) 10 MG tablet PLACE 1 TABLET INTO FEEDING TUBE THREE TIMES DAILY (Patient taking differently: Place 10 mg into feeding tube 2 (two) times daily. PLACE 1 TABLET INTO FEEDING TUBE THREE TIMES DAILY) 90 tablet 6  . diclofenac (VOLTAREN) 75 MG EC tablet Take 75 mg by mouth 2 (two) times daily as needed.    . diclofenac Sodium (VOLTAREN) 1 % GEL Apply 2 g topically 4 (four) times daily. 350 g 1  . diphenoxylate-atropine (LOMOTIL) 2.5-0.025 MG tablet Place 2 tablets into feeding tube in the morning, at noon, and at bedtime. 180 tablet 5  . ferrous sulfate 300 (60 Fe) MG/5ML syrup Place 5 mLs (300 mg total) into feeding tube 2 (two) times daily with a meal. 850 mL 3  . folic acid (FOLVITE) 277 MCG tablet Place 400 mcg into feeding tube daily.    . furosemide (LASIX) 20 MG tablet Take 1 tablet (20 mg total) by mouth daily. Take 1 tablet daily for 3 days for leg swelling 3 tablet 0  . gabapentin (NEURONTIN) 300 MG capsule TAKE 1 CAPSULE(300 MG) BY MOUTH THREE TIMES DAILY 270 capsule 0  . hydrocortisone cream 1 % APPLY EXTERNALLY TO THE AFFECTED AREA TWICE DAILY (Patient taking differently: Apply 1 application  topically 2 (two) times daily as needed for itching. ) 30 g 0  . magic mouthwash w/lidocaine SOLN Use 2 teaspoons swish and spit every 4-6 hours prn (Patient taking differently: Take 10 mLs by mouth every 4 (four) hours as needed for mouth pain. ) 150 mL 0  . meloxicam (MOBIC) 15 MG tablet TAKE 1 TABLET(15 MG) BY MOUTH DAILY 30 tablet 2  . Multiple Vitamin (MULTIVITAMIN WITH MINERALS) TABS tablet Place 1 tablet into feeding tube daily. 30 tablet 6  . pantoprazole (PROTONIX) 40 MG tablet TAKE 1 TABLET BY MOUTH DAILY (Patient taking differently: 40 mg See admin instructions. Give 40 mg per tube twice daily) 90 tablet 3  . Potassium Gluconate 2.5 MEQ TABS 1 tablet by Gastrostomy Tube route  daily.    . SUMAtriptan (IMITREX) 100 MG tablet Take 0.5-1 tablet by mouth at the onset of migraine and may repeat in 2 hours if headache persists or recurs. 10 tablet 0  . traMADol (ULTRAM) 50 MG tablet Place 2 tablets (100 mg total) into feeding tube every 6 (six) hours as needed for moderate pain. 30 tablet 0   No current facility-administered medications for this encounter.    Physical Findings: Wt Readings from Last 3 Encounters:  03/30/19 199 lb 2 oz (90.3 kg)  03/12/19 209 lb (94.8 kg)  03/07/19 208 lb (94.3 kg)    height is 5' 9"  (1.753 m) and weight is 199 lb 2 oz (90.3 kg). Her temporal temperature is 98.3 F (36.8 C). Her blood pressure is 140/98 (abnormal) and her pulse is 74. Her respiration is 18 and oxygen saturation is 97%. .  General: Alert and oriented, in no acute distress HEENT: Head is normocephalic. Extraocular movements are intact.  Postoperative changes in the tongue.  Nonspecific white mucosa over the tongue, do not favor this to be thrush.  Tongue deviates to the left. No lesions appreciated in the oral cavity or upper throat.  Neck: Nice and flat, no palpable masses appreciated.  Scar present at prior tracheotomy site   Lab Findings: Lab Results  Component Value Date   WBC 4.0 03/07/2019   HGB 12.9 03/07/2019   HCT 39.8 03/07/2019   MCV 87.2 03/07/2019   PLT 178.0 03/07/2019    Lab Results  Component Value Date   TSH 1.697 03/30/2019    Radiographic Findings: IR REPLACE G-TUBE SIMPLE WO FLUORO  Result Date: 03/08/2019 INDICATION: Patient with history of tongue cancer and chronic gastrostomy tube, who presents with discomfort at insertion site along with worn appearance of tube. Presents today for tube exchange. EXAM: GASTROSTOMY TUBE EXCHANGE WITHOUT FLUOROSCOPY MEDICATIONS: None ANESTHESIA/SEDATION: None CONTRAST:  None FLUOROSCOPY TIME:  None COMPLICATIONS: None immediate. PROCEDURE: Informed consent was obtained from the patient after a thorough  discussion of the procedural risks, benefits and alternatives. All questions were addressed. Maximal Sterile Barrier technique was utilized including caps, mask, sterile gloves, sterile drape, hand hygiene and skin antiseptic. A timeout was performed prior to the initiation of the procedure. The existing 53 French balloon retention gastrostomy tube was removed and exchanged out for a new 31 French balloon retention gastrostomy tube without immediate complications. 8 cc normal saline was placed into the balloon. Tube secured to skin site. Small amount of granulation tissue noted on the left side of tube insertion site. Silver nitrate applied to this area. IMPRESSION: Successful exchange of existing 62 French balloon retention gastrostomy tube with new 45 French balloon retention gastrostomy tube. Tube is ready for use. Read by:  Earley Abide, PA-C Electronically Signed   By: Aletta Edouard M.D.   On: 03/08/2019 14:54   CT ENTERO ABD/PELVIS W CONTAST  Result Date: 03/22/2019 CLINICAL DATA:  Chronic diarrhea with iron deficiency anemia. EXAM: CT ABDOMEN AND PELVIS WITH CONTRAST (ENTEROGRAPHY) TECHNIQUE: Multidetector CT of the abdomen and pelvis during bolus administration of intravenous contrast. Negative oral contrast was given. CONTRAST:  178m OMNIPAQUE IOHEXOL 300 MG/ML  SOLN COMPARISON:  05/23/2017 FINDINGS: Lower chest: Minimal lingular scarring. Scarring and/or dependent atelectasis in the right lower lobe. Mild cardiomegaly. Hepatobiliary: Cholecystectomy. Otherwise unremarkable. Pancreas: Unremarkable Spleen: Unremarkable Adrenals/Urinary Tract: Stable appearance of considerable bilateral renal scarring and underlying renal atrophy. Fluid density 3.4 by 2.7 cm left kidney upper pole cyst, previously 2.9 by 2.0 cm. Both adrenal glands appear normal. There are 3 clustered nonobstructive 1-2 mm calculi in the left kidney lower pole along the calyceal diverticulum on image 33/6. Suspected 1 mm nonobstructive  left mid kidney calculus on image 28/6. Stomach/Bowel: Gastrostomy tube noted. There is formed stool in the colon. Diverticulosis observed in the descending colon. There is wall thickening but also nondistention in the proximal sigmoid colon for example on image 54/6, without appreciable associated mucosal enhancement. The wall thickening is most likely primarily due to the nondistention. The cecum is in the right upper quadrant. Fatty ileocecal valve. Terminal ileum unremarkable. No dilated bowel. No significant abnormal mucosal enhancement. Expected fold pattern in the jejunum. Vascular/Lymphatic: Mild aortoiliac atherosclerotic vascular disease. A right inguinal node measures 1.1 cm in short axis on image 92/2, previously 0.8 cm. A left inguinal lymph node measures 1.1 cm in short axis on image 94/2, previously 0.8 cm. Reproductive: Unremarkable Other: No supplemental non-categorized findings. Musculoskeletal: Multilevel posterior element fusion the lower thoracic and lumbar spine with a notable degree of levoconvex thoracolumbar scoliosis, posterolateral rod and pedicle screw fixation at L4-L5-S1 bilaterally, interbody cage at L3-4, and Harrington rod noted with an upper laminar hook at the T8-9 level but without a lower laminar hook seen. Moreover, the left L4 pedicle screw no longer extends in the left L4 pedicle, but has a short threaded portion but extends into the L3-4 intervertebral space. IMPRESSION: 1. No findings of active Crohn's disease or active mucosal/transmural inflammation. Mild wall thickening in the proximal sigmoid colon is probably primarily due to nondistention. There is also descending colon diverticulosis. 2. Stable appearance of considerable bilateral renal scarring and underlying renal atrophy. 3. Nonobstructive left nephrolithiasis. 4. Mild cardiomegaly. 5. Borderline enlarged bilateral inguinal lymph nodes, probably reactive. 6. The left L4 pedicle screw no longer extends in the  pedicle but rather extends into the L3-4 intervertebral disc space. Aortic Atherosclerosis (ICD10-I70.0). Electronically Signed   By: WVan ClinesM.D.   On: 03/22/2019 16:11    Impression/Plan:   1) Head and Neck Cancer Status: NED  2) thyroid function: She has had some weight loss, about 10 pounds, recently, will order annual TSH today (this ultimately was within normal limits)  Lab Results  Component Value Date   TSH 1.697 03/30/2019    3) Follow up in 1 year.  The patient was encouraged to call with any issues or questions before then.  4) follow-up with ENT as scheduled.  5) We discussed measures to reduce the risk of infection during the COVID-19 pandemic.  I provided information to her on how to schedule her vaccine.  On date of service, in total, I spent 25 minutes on this encounter.  _____   SEppie Gibson MD

## 2019-04-02 ENCOUNTER — Other Ambulatory Visit: Payer: Self-pay | Admitting: *Deleted

## 2019-04-02 ENCOUNTER — Encounter: Payer: Self-pay | Admitting: *Deleted

## 2019-04-02 DIAGNOSIS — K529 Noninfective gastroenteritis and colitis, unspecified: Secondary | ICD-10-CM

## 2019-04-02 DIAGNOSIS — D509 Iron deficiency anemia, unspecified: Secondary | ICD-10-CM

## 2019-04-20 ENCOUNTER — Other Ambulatory Visit (HOSPITAL_COMMUNITY): Payer: BC Managed Care – PPO | Attending: Family Medicine

## 2019-04-23 ENCOUNTER — Other Ambulatory Visit (HOSPITAL_COMMUNITY)
Admission: RE | Admit: 2019-04-23 | Discharge: 2019-04-23 | Disposition: A | Discharge status: Home / Self Care | Payer: BLUE CROSS/BLUE SHIELD | Source: Ambulatory Visit | Attending: Internal Medicine | Admitting: Internal Medicine

## 2019-04-23 ENCOUNTER — Telehealth: Payer: Self-pay | Admitting: Internal Medicine

## 2019-04-23 DIAGNOSIS — Z20822 Contact with and (suspected) exposure to covid-19: Secondary | ICD-10-CM | POA: Diagnosis not present

## 2019-04-23 DIAGNOSIS — Z01812 Encounter for preprocedural laboratory examination: Secondary | ICD-10-CM | POA: Insufficient documentation

## 2019-04-23 LAB — SARS CORONAVIRUS 2 (TAT 6-24 HRS): SARS Coronavirus 2: NEGATIVE

## 2019-04-23 NOTE — Telephone Encounter (Signed)
Patient is requesting to speak with nurse in reference to her prep

## 2019-04-23 NOTE — Telephone Encounter (Signed)
Patients questions answered

## 2019-04-24 ENCOUNTER — Ambulatory Visit (HOSPITAL_COMMUNITY): Payer: BC Managed Care – PPO | Admitting: Anesthesiology

## 2019-04-24 ENCOUNTER — Other Ambulatory Visit: Payer: Self-pay

## 2019-04-24 ENCOUNTER — Encounter (HOSPITAL_COMMUNITY)
Admission: RE | Disposition: A | Discharge status: Home / Self Care | Payer: Self-pay | Source: Home / Self Care | Attending: Internal Medicine

## 2019-04-24 ENCOUNTER — Ambulatory Visit (HOSPITAL_COMMUNITY)
Admission: RE | Admit: 2019-04-24 | Discharge: 2019-04-24 | Disposition: A | Discharge status: Home / Self Care | Payer: BC Managed Care – PPO | Attending: Internal Medicine | Admitting: Internal Medicine

## 2019-04-24 ENCOUNTER — Encounter (HOSPITAL_COMMUNITY): Payer: Self-pay | Admitting: Internal Medicine

## 2019-04-24 DIAGNOSIS — Z7982 Long term (current) use of aspirin: Secondary | ICD-10-CM | POA: Insufficient documentation

## 2019-04-24 DIAGNOSIS — D500 Iron deficiency anemia secondary to blood loss (chronic): Secondary | ICD-10-CM | POA: Diagnosis not present

## 2019-04-24 DIAGNOSIS — M19012 Primary osteoarthritis, left shoulder: Secondary | ICD-10-CM | POA: Insufficient documentation

## 2019-04-24 DIAGNOSIS — Z85810 Personal history of malignant neoplasm of tongue: Secondary | ICD-10-CM | POA: Insufficient documentation

## 2019-04-24 DIAGNOSIS — G4733 Obstructive sleep apnea (adult) (pediatric): Secondary | ICD-10-CM | POA: Diagnosis not present

## 2019-04-24 DIAGNOSIS — Z931 Gastrostomy status: Secondary | ICD-10-CM | POA: Insufficient documentation

## 2019-04-24 DIAGNOSIS — M479 Spondylosis, unspecified: Secondary | ICD-10-CM | POA: Diagnosis not present

## 2019-04-24 DIAGNOSIS — Z923 Personal history of irradiation: Secondary | ICD-10-CM | POA: Diagnosis not present

## 2019-04-24 DIAGNOSIS — K529 Noninfective gastroenteritis and colitis, unspecified: Secondary | ICD-10-CM

## 2019-04-24 DIAGNOSIS — M419 Scoliosis, unspecified: Secondary | ICD-10-CM | POA: Diagnosis not present

## 2019-04-24 DIAGNOSIS — Z79899 Other long term (current) drug therapy: Secondary | ICD-10-CM | POA: Insufficient documentation

## 2019-04-24 DIAGNOSIS — Z8261 Family history of arthritis: Secondary | ICD-10-CM | POA: Insufficient documentation

## 2019-04-24 DIAGNOSIS — K219 Gastro-esophageal reflux disease without esophagitis: Secondary | ICD-10-CM | POA: Insufficient documentation

## 2019-04-24 DIAGNOSIS — I10 Essential (primary) hypertension: Secondary | ICD-10-CM | POA: Diagnosis not present

## 2019-04-24 DIAGNOSIS — K449 Diaphragmatic hernia without obstruction or gangrene: Secondary | ICD-10-CM | POA: Insufficient documentation

## 2019-04-24 DIAGNOSIS — G43909 Migraine, unspecified, not intractable, without status migrainosus: Secondary | ICD-10-CM | POA: Insufficient documentation

## 2019-04-24 DIAGNOSIS — Z8249 Family history of ischemic heart disease and other diseases of the circulatory system: Secondary | ICD-10-CM | POA: Diagnosis not present

## 2019-04-24 DIAGNOSIS — R197 Diarrhea, unspecified: Secondary | ICD-10-CM

## 2019-04-24 DIAGNOSIS — D509 Iron deficiency anemia, unspecified: Secondary | ICD-10-CM

## 2019-04-24 HISTORY — PX: ESOPHAGOGASTRODUODENOSCOPY (EGD) WITH PROPOFOL: SHX5813

## 2019-04-24 HISTORY — PX: GIVENS CAPSULE STUDY: SHX5432

## 2019-04-24 SURGERY — ESOPHAGOGASTRODUODENOSCOPY (EGD) WITH PROPOFOL
Anesthesia: Monitor Anesthesia Care

## 2019-04-24 MED ORDER — LACTATED RINGERS IV SOLN
INTRAVENOUS | Status: DC
  Administered 2019-04-24: 1000 mL via INTRAVENOUS

## 2019-04-24 MED ORDER — PROPOFOL 500 MG/50ML IV EMUL
INTRAVENOUS | Status: AC
  Filled 2019-04-24: qty 50

## 2019-04-24 MED ORDER — PROPOFOL 10 MG/ML IV BOLUS
INTRAVENOUS | Status: DC | PRN
  Administered 2019-04-24 (×2): 10 mg via INTRAVENOUS

## 2019-04-24 MED ORDER — SODIUM CHLORIDE 0.9 % IV SOLN: INTRAVENOUS | Status: DC

## 2019-04-24 MED ORDER — LIDOCAINE HCL 1 % IJ SOLN
INTRAMUSCULAR | Status: DC | PRN
  Administered 2019-04-24: 50 mg via INTRADERMAL

## 2019-04-24 MED ORDER — PROPOFOL 500 MG/50ML IV EMUL
INTRAVENOUS | Status: DC | PRN
  Administered 2019-04-24: 125 ug/kg/min via INTRAVENOUS

## 2019-04-24 SURGICAL SUPPLY — 16 items
BLOCK BITE 60FR ADLT L/F BLUE (MISCELLANEOUS) ×2 IMPLANT
ELECT REM PT RETURN 9FT ADLT (ELECTROSURGICAL)
ELECTRODE REM PT RTRN 9FT ADLT (ELECTROSURGICAL) IMPLANT
FORCEP RJ3 GP 1.8X160 W-NEEDLE (CUTTING FORCEPS) IMPLANT
FORCEPS BIOP RAD 4 LRG CAP 4 (CUTTING FORCEPS) IMPLANT
NDL SCLEROTHERAPY 25GX240 (NEEDLE) IMPLANT
NEEDLE SCLEROTHERAPY 25GX240 (NEEDLE) IMPLANT
PROBE APC STR FIRE (PROBE) IMPLANT
PROBE INJECTION GOLD (MISCELLANEOUS)
PROBE INJECTION GOLD 7FR (MISCELLANEOUS) IMPLANT
SNARE SHORT THROW 13M SML OVAL (MISCELLANEOUS) IMPLANT
SYR 50ML LL SCALE MARK (SYRINGE) IMPLANT
TOWEL COTTON PACK 4EA (MISCELLANEOUS) ×4 IMPLANT
TUBING ENDO SMARTCAP PENTAX (MISCELLANEOUS) ×4 IMPLANT
TUBING IRRIGATION ENDOGATOR (MISCELLANEOUS) ×2 IMPLANT
WATER STERILE IRR 1000ML POUR (IV SOLUTION) IMPLANT

## 2019-04-24 NOTE — Op Note (Signed)
[facility] Patient Name: Kristen [name] Procedure Date: 04/24/2019 MRN: [id number] Attending MD: Gatha Mayer , MD Date of Birth: 1965/01/31 CSN: [id number] Age: 54 Admit Type: Outpatient Procedure:                Upper GI endoscopy Indications:              Iron deficiency anemia, Diarrhea Providers:                Gatha Mayer, MD, Glori Bickers, RN, Theodora Blow,                            Technician Referring MD:              Medicines:                Propofol per Anesthesia, Monitored Anesthesia Care Complications:            No immediate complications. Estimated Blood Loss:     Estimated blood loss: none. Procedure:                After obtaining informed consent, the endoscope was                            passed under direct vision. Throughout the                            procedure, the patient's blood pressure, pulse, and                            oxygen saturations were monitored continuously. The                            GIF-H190 ([id number] Olympus gastroscope was                            introduced through the mouth, and advanced to the                            second part of duodenum. The upper GI endoscopy was                            accomplished without difficulty. The patient                            tolerated the procedure well. Scope In: Scope Out: Findings:      The esophagus was normal.      The stomach was normal s/p gastrostomy.      The examined duodenum was normal.      Using the endoscope, the video capsule enteroscope was advanced into the       second portion of the duodenum. Impression:               - Normal esophagus.                           - Normal stomach. s/p gastrostomy (bumper seen)                           -  Normal examined duodenum.                           - Successful completion of the Video Capsule                            Enteroscope placement.                           - No specimens  collected. Moderate Sedation:      Not Applicable - Patient had care per Anesthesia. Recommendation:           - Patient has a contact number available for                            emergencies. The signs and symptoms of potential                            delayed complications were discussed with the                            patient. Return to normal activities tomorrow.                            Written discharge instructions were provided to the                            patient.                           - Diet per capsule endoscopy protocol                           - Once capsule is read will call Procedure Code(s):        --- Professional ---                           [phone], Esophagogastroduodenoscopy, flexible,                            transoral; diagnostic, including collection of                            specimen(s) by brushing or washing, when performed                            (separate procedure) Diagnosis Code(s):        --- Professional ---                           D50.9, Iron deficiency anemia, unspecified                           R19.7, Diarrhea, unspecified CPT copyright 2019 [employer]. All rights reserved. The codes documented in this report are preliminary and upon coder review may  be revised to meet current compliance requirements. Gatha Mayer, MD 04/24/2019  12:04:19 PM This report has been signed electronically. Number of Addenda: 0

## 2019-04-24 NOTE — Transfer of Care (Signed)
Immediate Anesthesia Transfer of Care Note  Patient: Kristen [name]  Procedure(s) Performed: ESOPHAGOGASTRODUODENOSCOPY (EGD) WITH PROPOFOL (N/A ) GIVENS CAPSULE STUDY (N/A )  Patient Location: PACU and Endoscopy Unit  Anesthesia Type:MAC  Level of Consciousness: awake, oriented, drowsy and patient cooperative  Airway & Oxygen Therapy: Patient Spontanous Breathing and Patient connected to face mask oxygen  Post-op Assessment: Report given to RN and Post -op Vital signs reviewed and stable  Post vital signs: Reviewed and stable  Last Vitals:  Vitals Value Taken Time  BP 141/91 04/24/19 1204  Temp    Pulse 65 04/24/19 1205  Resp 16 04/24/19 1205  SpO2 100 % 04/24/19 1205  Vitals shown include unvalidated device data.  Last Pain:  Vitals:   04/24/19 1007  TempSrc: Oral  PainSc: 5          Complications: No apparent anesthesia complications

## 2019-04-24 NOTE — H&P (Signed)
[facility] History and Physical   Primary Care Physician:  [name], MD   Reason for Procedure:   chronic diarrhea, iron deficiency anemia ? IBD - needs capsule endoscope delivered to intestine because s/p tongue cancer surgery, XRT  Plan:    EGD, place capsule endoscope     HPI: Kristen [name] is a 54 y.o. female here for EGD placement of capsule endoscope due to reasons above.   Past Medical History:  Diagnosis Date  . Allergy   . Anemia    Iron deficiency  . Anxiety   . Arthritis    back, left shoulder  . Blood transfusion without reported diagnosis   . Chicken pox   . Depression   . Diarrhea    takes Imodium daily  . GERD (gastroesophageal reflux disease)   . H/O hiatal hernia   . Headache(784.0)   . History of kidney stones    1996ish  . History of radiation therapy 11/16/16- 01/01/17   Base of Tongue/ 66 gy in 33 fractions/ Dose: 2 Gy  . Hypertension   . Iron deficiency anemia 12/11/2018  . Migraine    none for 5 years (as of 01/13/16)  . OSA (obstructive sleep apnea) 10/13/2015   unable to get cpap, plans to get one in 2018  . Pneumonia   . Restless legs   . Scoliosis   . Shingles 08/28/2013  . Shortness of breath    with exertion  . Sleep apnea   . Tongue cancer ([place])    tongue cancer    Past Surgical History:  Procedure Laterality Date  . BACK SURGERY  07/21/1978  . CHOLECYSTECTOMY N/A 08/15/2013   Procedure: LAPAROSCOPIC CHOLECYSTECTOMY WITH INTRAOPERATIVE CHOLANGIOGRAM;  Surgeon: Gwenyth Ober, MD;  Location: [place];  Service: General;  Laterality: N/A;  . COLONOSCOPY N/A 01/09/2013   Procedure: COLONOSCOPY;  Surgeon: Beryle Beams, MD;  Location: [place];  Service: Endoscopy;  Laterality: N/A;  . COLONOSCOPY  03/2018  . DIRECT LARYNGOSCOPY  07/2016   Dr. Nicolette Bang [facility]  . ESOPHAGOGASTRODUODENOSCOPY  03/2018  . GASTROSTOMY TUBE PLACEMENT  09/27/2016  . HERNIA REPAIR Left 1981  . IR PATIENT EVAL TECH 0-60 MINS   12/13/2016  . IR PATIENT EVAL TECH 0-60 MINS  04/11/2017  . IR PATIENT EVAL TECH 0-60 MINS  03/24/2018  . IR PATIENT EVAL TECH 0-60 MINS  11/23/2018  . IR REPLACE G-TUBE SIMPLE WO FLUORO  12/12/2017  . IR REPLACE G-TUBE SIMPLE WO FLUORO  03/29/2018  . IR REPLACE G-TUBE SIMPLE WO FLUORO  06/29/2018  . IR REPLACE G-TUBE SIMPLE WO FLUORO  11/02/2018  . IR REPLACE G-TUBE SIMPLE WO FLUORO  03/08/2019  . MODIFIED RADICAL NECK DISSECTION Left 09/27/2016   Levels 1 & 2  . PARTIAL GLOSSECTOMY Left 09/27/2016   Left hemi partial glossectomy  . SPINE SURGERY  01/20/2016   fusion  . TONSILLECTOMY    . tracheotomy  09/27/2016  . TUBAL LIGATION  06/1988    Prior to Admission medications   Medication Sig Start Date End Date Taking? Authorizing Provider  amLODipine (NORVASC) 10 MG tablet TAKE 1 TABLET BY MOUTH EVERY DAY Patient taking differently: Place 10 mg into feeding tube daily.  08/01/18  Yes [name], MD  amphetamine-dextroamphetamine (ADDERALL) 10 MG tablet 1-3 daily as needed for sleepiness Patient taking differently: Place 10-30 mg into feeding tube daily as needed (sleepiness).  03/12/19  Yes Baird Lyons D, MD  aspirin 81 MG chewable tablet  Place 81 mg into feeding tube daily.  10/05/16  Yes [provider]  baclofen (LIORESAL) 10 MG tablet PLACE 1 TABLET INTO FEEDING TUBE THREE TIMES DAILY Patient taking differently: Place 10 mg into feeding tube 3 (three) times daily.  10/02/18  Yes Jessy Oto, MD  Calcium Citrate 200 MG TABS 200 mg by PEG Tube route daily at 12 noon.   Yes [provider]  diclofenac (VOLTAREN) 75 MG EC tablet Take 75 mg by mouth See admin instructions. Twice a day via feeding tube 01/06/19  Yes [provider]  diclofenac Sodium (VOLTAREN) 1 % GEL Apply 2 g topically 4 (four) times daily. Patient taking differently: Apply 2 g topically 3 (three) times daily as needed (Pain).  12/07/18  Yes Jessy Oto, MD  diphenoxylate-atropine  (LOMOTIL) 2.5-0.025 MG tablet Place 2 tablets into feeding tube in the morning, at noon, and at bedtime. 03/07/19  Yes Gatha Mayer, MD  ferrous gluconate (IRON 27) 240 (27 FE) MG tablet Place 240 mg into feeding tube daily.   Yes [provider]  folic acid (FOLVITE) 992 MCG tablet Place 400 mcg into feeding tube daily.   Yes [provider]  gabapentin (NEURONTIN) 300 MG capsule TAKE 1 CAPSULE(300 MG) BY MOUTH THREE TIMES DAILY Patient taking differently: Place 300 mg into feeding tube 3 (three) times daily.  02/26/19  Yes [name], MD  Multiple Vitamin (MULTIVITAMIN WITH MINERALS) TABS tablet Place 1 tablet into feeding tube daily. Patient taking differently: Place 1 tablet into feeding tube daily. Centrum 11/10/18  Yes Jessy Oto, MD  pantoprazole (PROTONIX) 40 MG tablet TAKE 1 TABLET BY MOUTH DAILY Patient taking differently: 40 mg See admin instructions. Give 40 mg per tube daily 08/01/18  Yes [name], MD  POTASSIUM GLUCONATE PO 99 mEq by PEG Tube route at bedtime.  10/04/16  Yes [provider]  ferrous sulfate 300 (60 Fe) MG/5ML syrup Place 5 mLs (300 mg total) into feeding tube 2 (two) times daily with a meal. Patient not taking: Reported on 04/18/2019 11/09/18   Jessy Oto, MD  furosemide (LASIX) 20 MG tablet Take 1 tablet (20 mg total) by mouth daily. Take 1 tablet daily for 3 days for leg swelling Patient not taking: Reported on 04/18/2019 12/22/18   [name], MD  hydrocortisone cream 1 % APPLY EXTERNALLY TO THE AFFECTED AREA TWICE DAILY Patient taking differently: Apply 1 application topically 2 (two) times daily as needed for itching.  06/16/18   [name], MD  magic mouthwash w/lidocaine SOLN Use 2 teaspoons swish and spit every 4-6 hours prn Patient taking differently: Take 10 mLs by mouth every 4 (four) hours as needed for mouth pain.  01/24/18   [name], MD  meloxicam (MOBIC) 15 MG tablet TAKE 1 TABLET(15 MG) BY  MOUTH DAILY Patient not taking: Reported on 04/18/2019 12/27/18   Jessy Oto, MD  SUMAtriptan (IMITREX) 100 MG tablet Take 0.5-1 tablet by mouth at the onset of migraine and may repeat in 2 hours if headache persists or recurs. Patient taking differently: Place 100 mg into feeding tube every 2 (two) hours as needed for migraine. Take 0.5-1 tablet by mouth at the onset of migraine and may repeat in 2 hours if headache persists or recurs. 11/09/18   Jessy Oto, MD  traMADol (ULTRAM) 50 MG tablet Place 2 tablets (100 mg total) into feeding tube every 6 (six) hours as needed for  moderate pain. Patient not taking: Reported on 04/18/2019 11/09/18   Jessy Oto, MD    Current Facility-Administered Medications  Medication Dose Route Frequency Provider Last Rate Last Admin  . 0.9 %  sodium chloride infusion   Intravenous Continuous Gatha Mayer, MD      . lactated ringers infusion   Intravenous Continuous Gatha Mayer, MD 125 mL/hr at 04/24/19 1022 1,000 mL at 04/24/19 1022    Allergies as of 04/02/2019 - Review Complete 03/30/2019  Allergen Reaction Noted  . Other Hives and Swelling 01/08/2013  . Sulfur Hives and Swelling 06/16/2011    Family History  Problem Relation Age of Onset  . Heart disease Father   . Heart attack Father   . Hypertension Mother   . Arthritis Mother   . Diabetes Maternal Grandmother   . Diabetes Paternal Grandmother   . Diabetes Maternal Uncle   . Colon polyps Neg Hx   . Colon cancer Neg Hx   . Esophageal cancer Neg Hx   . Stomach cancer Neg Hx   . 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  Physical Exam: Vital signs in last 24  hours: Temp:  [99.2 F (37.3 C)] 99.2 F (37.3 C) (04/06 1007) Pulse Rate:  [70] 70 (04/06 1007) Resp:  [18] 18 (04/06 1007) BP: (134)/(95) 134/95 (04/06 1007) SpO2:  [96 %] 96 % (04/06 1007) Weight:  [88.9 kg] 88.9 kg (04/06 1007)   General:   Alert,  Well-developed, well-nourished, pleasant and cooperative in NAD Lungs:  Clear throughout to auscultation.   Heart:  Regular rate and rhythm; no murmurs, clicks, rubs,  or gallops. Abdomen:  Soft, nontender and nondistended. Normal bowel sounds.  G tube Neuro/Psych:  Alert and cooperative. Normal mood and affect. A and O x 3   @Rheannon Cerney  Simonne Maffucci, MD, [facility] [phone] (pager) 04/24/2019 11:23 AM@

## 2019-04-24 NOTE — Anesthesia Postprocedure Evaluation (Signed)
Anesthesia Post Note  Patient: Kristen [name]  Procedure(s) Performed: ESOPHAGOGASTRODUODENOSCOPY (EGD) WITH PROPOFOL (N/A ) GIVENS CAPSULE STUDY (N/A )     Patient location during evaluation: Endoscopy Anesthesia Type: MAC Level of consciousness: awake and alert Pain management: pain level controlled Vital Signs Assessment: post-procedure vital signs reviewed and stable Respiratory status: spontaneous breathing, nonlabored ventilation and respiratory function stable Cardiovascular status: blood pressure returned to baseline and stable Postop Assessment: no apparent nausea or vomiting Anesthetic complications: no    Last Vitals:  Vitals:   04/24/19 1220 04/24/19 1230  BP: (!) 152/90 (!) 138/96  Pulse: 63 63  Resp: 13 18  Temp:    SpO2: 96% 98%    Last Pain:  Vitals:   04/24/19 1230  TempSrc:   PainSc: 0-No pain                 Lidia Collum

## 2019-04-24 NOTE — Anesthesia Preprocedure Evaluation (Signed)
Anesthesia Evaluation  Patient identified by MRN, date of birth, ID band  Reviewed: Allergy & Precautions, H&P , NPO status , Patient's Chart, lab work & pertinent test results  Airway Mallampati: II  TM Distance: >3 FB Neck ROM: Full    Dental  (+) Edentulous Lower, Edentulous Upper   Pulmonary sleep apnea ,    Pulmonary exam normal        Cardiovascular Exercise Tolerance: Good hypertension, Pt. on medications Normal cardiovascular exam     Neuro/Psych  Headaches, Anxiety Depression    GI/Hepatic Neg liver ROS, hiatal hernia, GERD  Medicated and Controlled,  Endo/Other  negative endocrine ROS  Renal/GU negative Renal ROS  negative genitourinary   Musculoskeletal  (+) Arthritis , Osteoarthritis,    Abdominal   Peds  Hematology  (+) Blood dyscrasia, anemia ,   Anesthesia Other Findings  H/o tongue cancer s/p resection, tracheostomy, XRT; decannulated not long after surgery; no recent issues - straightforward intubation in October 2020  Reproductive/Obstetrics negative OB ROS                             Anesthesia Physical  Anesthesia Plan  ASA: III  Anesthesia Plan: MAC   Post-op Pain Management:    Induction: Intravenous  PONV Risk Score and Plan: 3 and Propofol infusion, TIVA and Treatment may vary due to age or medical condition  Airway Management Planned: Natural Airway and Nasal Cannula  Additional Equipment: None  Intra-op Plan:   Post-operative Plan:   Informed Consent: I have reviewed the patients History and Physical, chart, labs and discussed the procedure including the risks, benefits and alternatives for the proposed anesthesia with the patient or authorized representative who has indicated his/her understanding and acceptance.       Plan Discussed with:   Anesthesia Plan Comments: (She underwent resection of base of tongue cancer and left selective neck  dissection (levels 1 and 2) on 09/27/16, along with Gtube placement. She then underwent RT, completed January 01, 2017. She briefly had a tracheostomy tube but that was decannulated not long after surgery. She continues to use a g-tube for medications only because she has some difficulty swallowing pills. Continues to follow with ENT Dr. Nicolette Bang at [facility]. Last seen 10/31/18, doing well per notes.   Pt had an eval of chest pain Jan 2018. Per cardiology notes, the discomfort was similar to her usual GERD symptoms and she had missed several doses of PPI. Enzymes negative. Echo showed normal LV function. She was recommended to continue PPI as prescribed.   Preop labs reviewed, unremarkable.  EKG 11/01/18: Sinus bradycardia. Rate 54. Nonspecific T wave abnormality.  TTE 01/23/16: - Left ventricle: The cavity size was normal. Wall thickness was   increased in a pattern of moderate LVH. Systolic function was   normal. The estimated ejection fraction was in the range of 60%   to 65%. Wall motion was normal; there were no regional wall   motion abnormalities. Doppler parameters are consistent with   abnormal left ventricular relaxation (grade 1 diastolic   dysfunction). - Mitral valve: There was mild regurgitation. - Left atrium: The atrium was moderately dilated. )        Anesthesia Quick Evaluation

## 2019-04-24 NOTE — Discharge Instructions (Signed)
The exam looked normal. The capsule camera was placed. Once I see results will call - may take until next week.  YOU HAD AN ENDOSCOPIC PROCEDURE TODAY: Refer to the procedure report and other information in the discharge instructions given to you for any specific questions about what was found during the examination. If this information does not answer your questions, please call Dr. Celesta Aver office at [phone] to clarify.   YOU SHOULD EXPECT: Some feelings of bloating in the abdomen. Passage of more gas than usual. Walking can help get rid of the air that was put into your GI tract during the procedure and reduce the bloating. If you had a lower endoscopy (such as a colonoscopy or flexible sigmoidoscopy) you may notice spotting of blood in your stool or on the toilet paper. Some abdominal soreness may be present for a day or two, also.  DIET: Your first meal following the procedure should be a light meal and then it is ok to progress to your normal diet. A half-sandwich or bowl of soup is an example of a good first meal. Heavy or fried foods are harder to digest and may make you feel nauseous or bloated. Drink plenty of fluids but you should avoid alcoholic beverages for 24 hours.   ACTIVITY: Your care partner should take you home directly after the procedure. You should plan to take it easy, moving slowly for the rest of the day. You can resume normal activity the day after the procedure however YOU SHOULD NOT DRIVE, use power tools, machinery or perform tasks that involve climbing or major physical exertion for 24 hours (because of the sedation medicines used during the test).   SYMPTOMS TO REPORT IMMEDIATELY: A gastroenterologist can be reached at any hour. Please call [phone]  for any of the following symptoms:   Following upper endoscopy (EGD, EUS, ERCP, esophageal dilation) Vomiting of blood or coffee ground material  New, significant abdominal pain  New, significant chest pain or  pain under the shoulder blades  Painful or persistently difficult swallowing  New shortness of breath  Black, tarry-looking or red, bloody stools

## 2019-04-26 NOTE — Procedures (Signed)
Indications:  Chronic diarrhea, prior CT raised ? IBD. Recent EGD, colonoscopy and CT-E negative. Also w/ iron def anemia.   Findings:  Capsule delivered by gastroscopy due to prior tongue surgery.  Tiny, innocent small bowel polyp seen but all else normal.   Summary:  No signs of IBD Continue current care and no more work-up now

## 2019-05-06 ENCOUNTER — Encounter: Payer: Self-pay | Admitting: Family Medicine

## 2019-05-07 ENCOUNTER — Ambulatory Visit (INDEPENDENT_AMBULATORY_CARE_PROVIDER_SITE_OTHER): Payer: BC Managed Care – PPO | Admitting: Specialist

## 2019-05-07 ENCOUNTER — Other Ambulatory Visit: Payer: Self-pay

## 2019-05-07 ENCOUNTER — Telehealth: Payer: Self-pay | Admitting: Family Medicine

## 2019-05-07 ENCOUNTER — Encounter: Payer: Self-pay | Admitting: Specialist

## 2019-05-07 ENCOUNTER — Encounter: Payer: Self-pay | Admitting: Family Medicine

## 2019-05-07 VITALS — BP 129/84 | HR 86 | Ht 69.0 in | Wt 190.0 lb

## 2019-05-07 DIAGNOSIS — Z4889 Encounter for other specified surgical aftercare: Secondary | ICD-10-CM | POA: Diagnosis not present

## 2019-05-07 DIAGNOSIS — M533 Sacrococcygeal disorders, not elsewhere classified: Secondary | ICD-10-CM

## 2019-05-07 DIAGNOSIS — M4726 Other spondylosis with radiculopathy, lumbar region: Secondary | ICD-10-CM

## 2019-05-07 DIAGNOSIS — Z981 Arthrodesis status: Secondary | ICD-10-CM | POA: Diagnosis not present

## 2019-05-07 DIAGNOSIS — M180 Bilateral primary osteoarthritis of first carpometacarpal joints: Secondary | ICD-10-CM

## 2019-05-07 DIAGNOSIS — M4325 Fusion of spine, thoracolumbar region: Secondary | ICD-10-CM | POA: Diagnosis not present

## 2019-05-07 MED ORDER — TRAMADOL HCL 50 MG PO TABS: 50.0000 mg | ORAL_TABLET | Freq: Four times a day (QID) | ORAL | 0 refills | Status: DC | PRN

## 2019-05-07 MED ORDER — GABAPENTIN 300 MG PO CAPS: ORAL_CAPSULE | ORAL | 0 refills | Status: DC

## 2019-05-07 NOTE — Patient Instructions (Addendum)
Avoid bending, stooping and avoid lifting weights greater than 10 lbs. Avoid prolong standing and walking. Avoid frequent bending and stooping  No lifting greater than 10 lbs. May use ice or moist heat for pain. Weight loss is of benefit. Handicap license is approved. Dr. Romona Curls secretary/Assistant will call to arrange for Right SI joint injection  Stop diclofenac. Will try tramadol for pain, hopefully this with help decrease diarrhea that may be related to diclofenac use.  Davonna Belling work belt. 1/2 days. Physical therapy for postural exercise, hip hyperextension exercises, LE strengthening and hamstring stretching.

## 2019-05-07 NOTE — Progress Notes (Signed)
Office Visit Note   Patient: Kristen [name]           Date of Birth: Dec 25, 1965           MRN: [id number] Visit Date: 05/07/2019              Requested by: [name], MD [place],  [place] [id number] PCP: [name], MD   Assessment & Plan: Visit Diagnoses:  1. Sacroiliac joint disease   2. Fusion of spine of thoracolumbar region   3. S/P lumbar fusion   4. Encounter for other specified surgical aftercare   5. Other spondylosis with radiculopathy, lumbar region     Plan: Avoid bending, stooping and avoid lifting weights greater than 10 lbs. Avoid prolong standing and walking. Avoid frequent bending and stooping  No lifting greater than 10 lbs. May use ice or moist heat for pain. Weight loss is of benefit. Handicap license is approved. Dr. Romona Curls secretary/Assistant will call to arrange for Right SI joint injection  Stop diclofenac. Will try tramadol for pain, hopefully this with help decrease diarrhea that may be related to diclofenac use.  Davonna Belling work belt. 1/2 days. Physical therapy for postural exercise, hip hyperextension exercises, LE strengthening and hamstring stretching.  Follow-Up Instructions: Return in about 4 weeks (around 06/04/2019).   Orders:  Orders Placed This Encounter  Procedures  . Ambulatory referral to Physical Medicine Rehab   Meds ordered this encounter  Medications  . traMADol (ULTRAM) 50 MG tablet    Sig: Take 1 tablet (50 mg total) by mouth every 6 (six) hours as needed.    Dispense:  30 tablet    Refill:  0      Procedures: No procedures performed   Clinical Data: No additional findings.   Subjective: Chief Complaint  Patient presents with  . Spine - Follow-up    54 year old female with history of lumbar DDD and previous lumbar fusions for DDD and spondylolisthesis L3-4 and L4-5 in 2018 and she had extension of the fusion to S1 10/2018 and she has a long segment thoracolumbar fusion. She  reports pain in the right upper buttock with prolong standing and walking and she has noticed some lumps under the right axilla. The pain right buttock is increasing and presently it is about a "3", but the buttock pain goes up to a 10-13. No bowel or bladder incontinence. Dr. Carlean Purl did endoscopy and did not find abnormality.   Review of Systems   Objective: Vital Signs: BP 129/84 (BP Location: Left Arm, Patient Position: Sitting)   Pulse 86   Ht 5' 9"  (1.753 m)   Wt 190 lb (86.2 kg)   LMP 11/15/2013   BMI 28.06 kg/m   Physical Exam  Ortho Exam  Specialty Comments:  No specialty comments available.  Imaging: No results found.   PMFS History: Patient Active Problem List   Diagnosis Date Noted  . Spinal stenosis of lumbar region 01/20/2016    Priority: High    Class: Chronic  . DDD (degenerative disc disease), lumbar 01/20/2016    Priority: High    Class: Chronic  . Peripheral edema 03/12/2019  . Iron deficiency anemia 12/11/2018  . Loosening of hardware in spine ([place])   . Other spondylosis with radiculopathy, lumbar region   . History of lumbar spinal fusion 11/06/2018  . UTI (urinary tract infection) 05/15/2017  . 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 Malignant neoplasm of  base of tongue ([place]) 10/29/2016  . 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[place]) 10/29/2016  . Headache associated with sexual activity 05/14/2016  . Tongue lesion 05/14/2016  . Thrombocytopenia ([place]) 01/22/2016  . Chronic diarrhea 01/22/2016  . Chest pain   . Essential hypertension   . Spinal stenosis of lumbar region with neurogenic claudication 01/20/2016  . Low back pain 12/22/2015  . Hypersomnolence 10/13/2015  . Muscle cramp 08/01/2015  . GERD (gastroesophageal reflux disease) 07/09/2015  . Excessive daytime sleepiness 06/18/2015  . Morbid obesity with BMI of 40.0-44.9, adult ([place]) 01/08/2013   Past Medical History:  Diagnosis Date  . Allergy   . Anemia     Iron deficiency  . Anxiety   . Arthritis    back, left shoulder  . Blood transfusion without reported diagnosis   . Chicken pox   . Depression   . Diarrhea    takes Imodium daily  . GERD (gastroesophageal reflux disease)   . H/O hiatal hernia   . Headache(784.0)   . History of kidney stones    1996ish  . History of radiation therapy 11/16/16- 01/01/17   Base of Tongue/ 66 gy in 33 fractions/ Dose: 2 Gy  . Hypertension   . Iron deficiency anemia 12/11/2018  . Migraine    none for 5 years (as of 01/13/16)  . OSA (obstructive sleep apnea) 10/13/2015   unable to get cpap, plans to get one in 2018  . Pneumonia   . Restless legs   . Scoliosis   . Shingles 08/28/2013  . Shortness of breath    with exertion  . Sleep apnea   . Tongue cancer ([place])    tongue cancer    Family History  Problem Relation Age of Onset  . Heart disease Father   . Heart attack Father   . Hypertension Mother   . Arthritis Mother   . Diabetes Maternal Grandmother   . Diabetes Paternal Grandmother   . Diabetes Maternal Uncle   . Colon polyps Neg Hx   . Colon cancer Neg Hx   . Esophageal cancer Neg Hx   . Stomach cancer Neg Hx   . Rectal cancer Neg Hx     Past Surgical History:  Procedure Laterality Date  . BACK SURGERY  07/21/1978  . CHOLECYSTECTOMY N/A 08/15/2013   Procedure: LAPAROSCOPIC CHOLECYSTECTOMY WITH INTRAOPERATIVE CHOLANGIOGRAM;  Surgeon: Gwenyth Ober, MD;  Location: [place];  Service: General;  Laterality: N/A;  . COLONOSCOPY N/A 01/09/2013   Procedure: COLONOSCOPY;  Surgeon: Beryle Beams, MD;  Location: [place];  Service: Endoscopy;  Laterality: N/A;  . COLONOSCOPY  03/2018  . DIRECT LARYNGOSCOPY  07/2016   Dr. Nicolette Bang [facility]  . ESOPHAGOGASTRODUODENOSCOPY  03/2018  . ESOPHAGOGASTRODUODENOSCOPY (EGD) WITH PROPOFOL N/A 04/24/2019   Procedure: ESOPHAGOGASTRODUODENOSCOPY (EGD) WITH PROPOFOL;  Surgeon: Gatha Mayer, MD;  Location: [facility];  Service: Endoscopy;  Laterality: N/A;  .  GASTROSTOMY TUBE PLACEMENT  09/27/2016  . GIVENS CAPSULE STUDY N/A 04/24/2019   Procedure: GIVENS CAPSULE STUDY;  Surgeon: Gatha Mayer, MD;  Location: [facility];  Service: Endoscopy;  Laterality: N/A;  . HERNIA REPAIR Left 1981  . IR PATIENT EVAL TECH 0-60 MINS  12/13/2016  . IR PATIENT EVAL TECH 0-60 MINS  04/11/2017  . IR PATIENT EVAL TECH 0-60 MINS  03/24/2018  . IR PATIENT EVAL TECH 0-60 MINS  11/23/2018  . IR REPLACE G-TUBE SIMPLE WO FLUORO  12/12/2017  . IR REPLACE G-TUBE SIMPLE WO FLUORO  03/29/2018  . IR REPLACE G-TUBE SIMPLE WO FLUORO  06/29/2018  . IR REPLACE G-TUBE SIMPLE WO FLUORO  11/02/2018  . IR REPLACE G-TUBE SIMPLE WO FLUORO  03/08/2019  . MODIFIED RADICAL NECK DISSECTION Left 09/27/2016   Levels 1 & 2  . PARTIAL GLOSSECTOMY Left 09/27/2016   Left hemi partial glossectomy  . SPINE SURGERY  01/20/2016   fusion  . TONSILLECTOMY    . tracheotomy  09/27/2016  . TUBAL LIGATION  06/1988   Social History   Occupational History  . Occupation: Air cabin crew  Tobacco Use  . Smoking status: Never Smoker  . Smokeless tobacco: Never Used  Substance and Sexual Activity  . Alcohol use: Yes    Alcohol/week: 7.0 standard drinks    Types: 7 Cans of beer per week  . Drug use: No  . Sexual activity: Yes    Birth control/protection: Surgical

## 2019-05-08 ENCOUNTER — Telehealth: Payer: Self-pay | Admitting: Family Medicine

## 2019-05-08 NOTE — Telephone Encounter (Signed)
error 

## 2019-05-09 ENCOUNTER — Encounter: Payer: Self-pay | Admitting: Specialist

## 2019-05-09 ENCOUNTER — Other Ambulatory Visit: Payer: Self-pay

## 2019-05-09 ENCOUNTER — Encounter: Payer: Self-pay | Admitting: Family Medicine

## 2019-05-09 ENCOUNTER — Ambulatory Visit: Payer: BLUE CROSS/BLUE SHIELD | Admitting: Physical Therapy

## 2019-05-09 ENCOUNTER — Ambulatory Visit: Payer: BC Managed Care – PPO | Admitting: Family Medicine

## 2019-05-09 VITALS — BP 120/80 | HR 78 | Temp 97.9°F | Wt 191.0 lb

## 2019-05-09 DIAGNOSIS — K121 Other forms of stomatitis: Secondary | ICD-10-CM | POA: Diagnosis not present

## 2019-05-09 DIAGNOSIS — L720 Epidermal cyst: Secondary | ICD-10-CM | POA: Diagnosis not present

## 2019-05-09 MED ORDER — MAGIC MOUTHWASH W/LIDOCAINE: ORAL | 0 refills | Status: DC

## 2019-05-09 MED ORDER — DOXYCYCLINE HYCLATE 100 MG PO TABS: 100.0000 mg | ORAL_TABLET | Freq: Two times a day (BID) | ORAL | 0 refills | Status: AC

## 2019-05-09 NOTE — Progress Notes (Signed)
Subjective:    Patient ID: Kristen [name], female    DOB: 12/25/1965, 54 y.o.   MRN: [id number]  No chief complaint on file.   HPI Patient was seen today for acute concern.  Pt with bumps in R axilla x 2 days.  Painful.  Denies drainage, fever, chills.  Pt also notes thrush returning.  Requesting refill on magic mouthwash.  Pt states she was finally able to get disability.  She is now retired.  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  Past Medical History:  Diagnosis Date  . Allergy   . Anemia    Iron deficiency  . Anxiety   . Arthritis    back, left shoulder  . Blood transfusion without reported diagnosis   . Chicken pox   . Depression   . Diarrhea    takes Imodium daily  . GERD (gastroesophageal reflux disease)   . H/O hiatal hernia   . Headache(784.0)   . History of kidney stones    1996ish  . History of radiation therapy 11/16/16- 01/01/17   Base of Tongue/ 66 gy in 33 fractions/ Dose: 2 Gy  . Hypertension   . Iron deficiency anemia 12/11/2018  . Migraine    none for 5 years (as of 01/13/16)  . OSA (obstructive sleep apnea) 10/13/2015   unable to get cpap, plans to get one in 2018  . Pneumonia   . Restless legs   . Scoliosis   . Shingles 08/28/2013  . Shortness of breath    with exertion  . Sleep apnea   . Tongue cancer (HCC)    tongue cancer    Allergies  Allergen Reactions  . Other Hives and Swelling    tomato  . Sulfur Hives and Swelling    ROS General: Denies fever, chills, night sweats, changes in weight, changes in appetite HEENT: Denies headaches, ear pain, changes in vision, rhinorrhea, sore throat CV: Denies CP, palpitations, SOB, orthopnea Pulm: Denies SOB, cough, wheezing GI: Denies abdominal pain, nausea, vomiting, diarrhea, constipation GU: Denies dysuria, hematuria, frequency, vaginal discharge Msk: Denies muscle cramps, joint pains Neuro: Denies weakness, numbness, tingling Skin: Denies rashes, bruising  +bumps in  R axilla Psych: Denies depression, anxiety, hallucinations      Objective:    Last menstrual period 11/15/2013.   Gen. Pleasant, well-nourished, in no distress, normal affect  HEENT: [place]/AT, face symmetric, conjunctiva clear, no scleral icterus, PERRLA, EOMI, nares patent without drainage Lungs: no accessory muscle use, CTAB, no wheezes or rales Cardiovascular: RRR, no peripheral edema Neuro:  A&Ox3, CN II-XII intact, normal gait Skin:  Warm, dry, intact.  5 tender papules with induration in R axillary area.  No drainage or erythema noted.   Wt Readings from Last 3 Encounters:  05/07/19 190 lb (86.2 kg)  04/24/19 196 lb (88.9 kg)  03/30/19 199 lb 2 oz (90.3 kg)    Lab Results  Component Value Date   WBC 4.0 03/07/2019   HGB 12.9 03/07/2019   HCT 39.8 03/07/2019   PLT 178.0 03/07/2019   GLUCOSE 84 12/20/2018   ALT 13 12/20/2018   AST 16 12/20/2018   NA 143 12/20/2018   K 4.3 12/20/2018   CL 106 12/20/2018   CREATININE 0.73 12/20/2018   BUN 14 12/20/2018   CO2 29 12/20/2018   TSH 1.697 03/30/2019   INR 1.1 11/01/2018   HGBA1C 5.6 06/18/2015    Incision and Drainage Procedure Note  Pre-operative Diagnosis: epidermoid cyst x 5  Post-operative  Diagnosis: same  Indications: painful cysts  Anesthesia: 1% plain lidocaine  Procedure Details  The procedure, risks and complications have been discussed in detail (including, but not limited to airway compromise, infection, bleeding) with the patient, and the patient has signed consent to the procedure.  The skin was sterilely prepped and draped over the affected area in the usual fashion. After adequate local anesthesia, I&D with a #11 blade was performed on the right axilla. Purulent drainage: present.  Pt had difficulty getting comfortable 2/2 h/o spinal fusion and back pain.  Pt also extremely tender and difficult to anesthetize. The patient was observed until stable.  Findings: cyst  EBL: minimal  cc's  Condition: Tolerated procedure well and Stable   Complications: none.   Assessment/Plan:  Epidermoid cyst  -5 cyst in R axilla -consent obtained.  I&D performed.  Pt tolerated procedure.  Pt had difficulty getting comfortable during procedure 2/2 h/o back pain and extreme tenderness when anesthetizing -will start abx -pt to do warm compresses. -given precautions. -will f/u in 5 days. - Plan: doxycycline (VIBRA-TABS) 100 MG tablet  Stomatitis  - Plan: magic mouthwash w/lidocaine SOLN  F/u in 5 days.  Grier Mitts, MD

## 2019-05-09 NOTE — Patient Instructions (Signed)
Epidermal Cyst  An epidermal cyst is a sac made of skin tissue. The sac contains a substance called keratin. Keratin is a protein that is normally secreted through the hair follicles. When keratin becomes trapped in the top layer of skin (epidermis), it can form an epidermal cyst. Epidermal cysts can be found anywhere on your body. These cysts are usually harmless (benign), and they may not cause symptoms unless they become infected. What are the causes? This condition may be caused by:  A blocked hair follicle.  A hair that curls and re-enters the skin instead of growing straight out of the skin (ingrown hair).  A blocked pore.  Irritated skin.  An injury to the skin.  Certain conditions that are passed along from parent to child (inherited).  Human papillomavirus (HPV).  Long-term (chronic) sun damage to the skin. What increases the risk? The following factors may make you more likely to develop an epidermal cyst:  Having acne.  Being overweight.  Being 54-44 years old. What are the signs or symptoms? The only symptom of this condition may be a small, painless lump underneath the skin. When an epidermal cyst ruptures, it may become infected. Symptoms may include:  Redness.  Inflammation.  Tenderness.  Warmth.  Fever.  Keratin draining from the cyst. Keratin is grayish-white, bad-smelling substance.  Pus draining from the cyst. How is this diagnosed? This condition is diagnosed with a physical exam.  In some cases, you may have a sample of tissue (biopsy) taken from your cyst to be examined under a microscope or tested for bacteria.  You may be referred to a health care provider who specializes in skin care (dermatologist). How is this treated? In many cases, epidermal cysts go away on their own without treatment. If a cyst becomes infected, treatment may include:  Opening and draining the cyst, done by a health care provider. After draining, minor surgery to  remove the rest of the cyst may be done.  Antibiotic medicine.  Injections of medicines (steroids) that help to reduce inflammation.  Surgery to remove the cyst. Surgery may be done if the cyst: ? Becomes large. ? Bothers you. ? Has a chance of turning into cancer.  Do not try to open a cyst yourself. Follow these instructions at home:  Take over-the-counter and prescription medicines only as told by your health care provider.  If you were prescribed an antibiotic medicine, take it it as told by your health care provider. Do not stop using the antibiotic even if you start to feel better.  Keep the area around your cyst clean and dry.  Wear loose, dry clothing.  Avoid touching your cyst.  Check your cyst every day for signs of infection. Check for: ? Redness, swelling, or pain. ? Fluid or blood. ? Warmth. ? Pus or a bad smell.  Keep all follow-up visits as told by your health care provider. This is important. How is this prevented?  Wear clean, dry, clothing.  Avoid wearing tight clothing.  Keep your skin clean and dry. Take showers or baths every day. Contact a health care provider if:  Your cyst develops symptoms of infection.  Your condition is not improving or is getting worse.  You develop a cyst that looks different from other cysts you have had.  You have a fever. Get help right away if:  Redness spreads from the cyst into the surrounding area. Summary  An epidermal cyst is a sac made of skin tissue. These cysts are  usually harmless (benign), and they may not cause symptoms unless they become infected.  If a cyst becomes infected, treatment may include surgery to open and drain the cyst, or to remove it. Treatment may also include medicines by mouth or through an injection.  Take over-the-counter and prescription medicines only as told by your health care provider. If you were prescribed an antibiotic medicine, take it as told by your health care  provider. Do not stop using the antibiotic even if you start to feel better.  Contact a health care provider if your condition is not improving or is getting worse.  Keep all follow-up visits as told by your health care provider. This is important. This information is not intended to replace advice given to you by your health care provider. Make sure you discuss any questions you have with your health care provider. Document Revised: 04/27/2018 Document Reviewed: 07/18/2017 Elsevier Patient Education  [place].  Epidermal Cyst Removal, Care After This sheet gives you information about how to care for yourself after your procedure. Your health care provider may also give you more specific instructions. If you have problems or questions, contact your health care provider. What can I expect after the procedure? After the procedure, it is common to have:  Soreness in the area where your cyst was removed.  Tightness or itchiness from the stitches (sutures) in your skin. Follow these instructions at home: Medicines  Take over-the-counter and prescription medicines only as told by your health care provider.  If you were prescribed an antibiotic medicine or ointment, take or apply it as told by your health care provider. Do not stop using the antibiotic even if you start to feel better. Incision care   Follow instructions from your health care provider about how to take care of your incision. Make sure you: ? Wash your hands with soap and water before you change your bandage (dressing). If soap and water are not available, use hand sanitizer. ? Change your dressing as told by your health care provider. ? Leave sutures, skin glue, or adhesive strips in place. These skin closures may need to stay in place for 1-2 weeks or longer. If adhesive strip edges start to loosen and curl up, you may trim the loose edges. Do not remove adhesive strips completely unless your health care provider  tells you to do that.  Keep the dressingdry until your health care provider says that it can be removed.  After your dressing is off, check your incision area every day for signs of infection. Check for: ? Redness, swelling, or pain. ? Fluid or blood. ? Warmth. ? Pus or a bad smell. General instructions  Do not take baths, swim, or use a hot tub until your health care provider approves. Ask your health care provider if you may take showers. You may only be allowed to take sponge baths.  Your health care provider may ask you to avoid contact sports or activities that take a lot of effort. Do not do anything that stretches or puts pressure on your incision.  You can return to your normal diet.  Keep all follow-up visits as told by your health care provider. This is important. Contact a health care provider if:  You have a fever.  You have redness, swelling, or pain in the incision area.  You have fluid or blood coming from your incision.  You have pus or a bad smell coming from your incision.  Your incision feels warm to  the touch.  Your cyst grows back. Summary  After the procedure, it is common to have soreness in the area where your cyst was removed.  Take or apply over-the-counter and prescription medicines only as told by your health care provider.  Follow instructions from your health care provider about how to take care of your incision. This information is not intended to replace advice given to you by your health care provider. Make sure you discuss any questions you have with your health care provider. Document Revised: 04/26/2017 Document Reviewed: 10/28/2016 Elsevier Patient Education  [place].  Stomatitis Stomatitis is a condition that causes swelling (inflammation) in your mouth. It can affect all or part of the inside of the mouth. The condition often affects your cheek, teeth, gums, lips, and tongue. It can also affect the tissues that produce  mucus in your mouth (mucosa). Pain from stomatitis can make it hard for you to eat or drink. Very bad cases of this condition can lead to:  Not getting enough fluid in your body (dehydration).  Poor nutrition. Follow these instructions at home: Medicines  Take over-the-counter and prescription medicines only as told by your doctor.  If you were given an antibiotic medicine, take it as told by your doctor. Do not stop taking the medicine even if you start to feel better.  Do not use products that contain benzocaine in children who are younger than 2 years.  Do not drive or use heavy machinery while taking prescription pain medicine. Eating and drinking  Eat a balanced diet.  Do not eat: ? Spicy foods. ? Citrus, such as oranges. ? Foods that have sharp edges, such as chips.  Do not eat any foods that you think may be causing this condition.  Do not drink alcohol.  Drink enough fluid to keep your pee (urine) pale yellow. This will keep you hydrated. Lifestyle      Take good care of your mouth and teeth (oral hygiene): ? Gently brush your teeth with a soft toothbrush. Do this 2 times each day. ? Floss your teeth every day. ? Have your teeth cleaned regularly. Do this as told by your dentist.  If you have dentures, make sure that they fit the way that they should.  Do not use any products that contain nicotine or tobacco, such as cigarettes and e-cigarettes. If you need help quitting, ask your doctor.  Find ways to lower your stress. Try yoga or meditation. Ask your doctor for other ideas. General instructions  Use a salt-water rinse for pain as told by your doctor.  Keep all follow-up visits as told by your doctor. This is important. Contact a doctor if:  Your symptoms get worse.  You have new symptoms, like: ? A rash. ? New symptoms that do not involve your mouth area.  Your symptoms last longer than 3 weeks.  Your symptoms go away and then come back.  You  are more tired.  You feel weaker.  You stop feeling hungry.  You feel sick to your stomach (nauseous). Get help right away if you:  Have a fever.  Are not able to eat or drink.  Have a lot of bleeding in your mouth. Summary  Stomatitis is a condition that causes swelling in your mouth.  Pain from stomatitis can make it hard for you to eat or drink. Very bad cases of this condition can lead to not getting enough fluid in your body or poor nutrition.  Take medicines only  as told by your doctor.  Follow instructions from your doctor on diet and lifestyle changes to help manage your symptoms. This information is not intended to replace advice given to you by your health care provider. Make sure you discuss any questions you have with your health care provider. Document Revised: 02/01/2017 Document Reviewed: 02/01/2017 Elsevier Patient Education  2020 [employer].

## 2019-05-14 ENCOUNTER — Ambulatory Visit: Payer: BC Managed Care – PPO | Admitting: Family Medicine

## 2019-05-16 ENCOUNTER — Ambulatory Visit: Payer: BC Managed Care – PPO | Admitting: Rehabilitative and Restorative Service Providers"

## 2019-05-16 ENCOUNTER — Other Ambulatory Visit: Payer: Self-pay

## 2019-05-16 ENCOUNTER — Encounter: Payer: Self-pay | Admitting: Rehabilitative and Restorative Service Providers"

## 2019-05-16 DIAGNOSIS — G8929 Other chronic pain: Secondary | ICD-10-CM

## 2019-05-16 DIAGNOSIS — R293 Abnormal posture: Secondary | ICD-10-CM

## 2019-05-16 DIAGNOSIS — R262 Difficulty in walking, not elsewhere classified: Secondary | ICD-10-CM

## 2019-05-16 DIAGNOSIS — M5441 Lumbago with sciatica, right side: Secondary | ICD-10-CM | POA: Diagnosis not present

## 2019-05-16 DIAGNOSIS — M6281 Muscle weakness (generalized): Secondary | ICD-10-CM

## 2019-05-16 NOTE — Patient Instructions (Signed)
Access Code: [email or web address] URL: [email or web address] Date: 05/16/2019 Prepared by: Vista Mink  Exercises Standing Scapular Retraction - 5 x daily - 7 x weekly - 1 sets - 5 reps - 5 hold Standing Lumbar Extension at [place] - 1 x daily - 7 x weekly - 5 sets - 5 reps - 3 hold Heel Toe Raises with Counter Support - 3 x daily - 7 x weekly - 1 sets - 10 reps - 3 hold Supine Single Knee to Chest Stretch - 2-3 x daily - 7 x weekly - 1 sets - 5 reps - 20 seconds hold

## 2019-05-16 NOTE — Therapy (Signed)
[facility] Physical Therapy [address] [place], [place], [address] Phone: [phone]   Fax:  [phone]  Physical Therapy Evaluation  Patient Details  Name: Kristen [name] MRN: [id number] Date of Birth: [birth year] Referring Provider (PT): Jessy Oto   Encounter Date: 05/16/2019  PT End of Session - 05/16/19 1640    Visit Number  1    Number of Visits  16    Date for PT Re-Evaluation  07/11/19    PT Start Time  4656    PT Stop Time  1630    PT Time Calculation (min)  60 min    Activity Tolerance  No increased pain;Patient limited by fatigue    Behavior During Therapy  [facility] for tasks assessed/performed       Past Medical History:  Diagnosis Date  . Allergy   . Anemia    Iron deficiency  . Anxiety   . Arthritis    back, left shoulder  . Blood transfusion without reported diagnosis   . Chicken pox   . Depression   . Diarrhea    takes Imodium daily  . GERD (gastroesophageal reflux disease)   . H/O hiatal hernia   . Headache(784.0)   . History of kidney stones    1996ish  . History of radiation therapy 11/16/16- 01/01/17   Base of Tongue/ 66 gy in 33 fractions/ Dose: 2 Gy  . Hypertension   . Iron deficiency anemia 12/11/2018  . Migraine    none for 5 years (as of 01/13/16)  . OSA (obstructive sleep apnea) 10/13/2015   unable to get cpap, plans to get one in 2018  . Pneumonia   . Restless legs   . Scoliosis   . Shingles 08/28/2013  . Shortness of breath    with exertion  . Sleep apnea   . Tongue cancer ([place])    tongue cancer    Past Surgical History:  Procedure Laterality Date  . BACK SURGERY  07/21/1978  . CHOLECYSTECTOMY N/A 08/15/2013   Procedure: LAPAROSCOPIC CHOLECYSTECTOMY WITH INTRAOPERATIVE CHOLANGIOGRAM;  Surgeon: Gwenyth Ober, MD;  Location: [place];  Service: General;  Laterality: N/A;  . COLONOSCOPY N/A 01/09/2013   Procedure: COLONOSCOPY;  Surgeon: Beryle Beams, MD;  Location: [place];  Service: Endoscopy;   Laterality: N/A;  . COLONOSCOPY  03/2018  . DIRECT LARYNGOSCOPY  07/2016   Dr. Nicolette Bang [facility]  . ESOPHAGOGASTRODUODENOSCOPY  03/2018  . ESOPHAGOGASTRODUODENOSCOPY (EGD) WITH PROPOFOL N/A 04/24/2019   Procedure: ESOPHAGOGASTRODUODENOSCOPY (EGD) WITH PROPOFOL;  Surgeon: Gatha Mayer, MD;  [facility];  Service: Endoscopy;  Laterality: N/A;  . GASTROSTOMY TUBE PLACEMENT  09/27/2016  . GIVENS CAPSULE STUDY N/A 04/24/2019   Procedure: GIVENS CAPSULE STUDY;  Surgeon: Gatha Mayer, MD;  [facility];  Service: Endoscopy;  Laterality: N/A;  . HERNIA REPAIR Left 1981  . IR PATIENT EVAL TECH 0-60 MINS  12/13/2016  . IR PATIENT EVAL TECH 0-60 MINS  04/11/2017  . IR PATIENT EVAL TECH 0-60 MINS  03/24/2018  . IR PATIENT EVAL TECH 0-60 MINS  11/23/2018  . IR REPLACE G-TUBE SIMPLE WO FLUORO  12/12/2017  . IR REPLACE G-TUBE SIMPLE WO FLUORO  03/29/2018  . IR REPLACE G-TUBE SIMPLE WO FLUORO  06/29/2018  . IR REPLACE G-TUBE SIMPLE WO FLUORO  11/02/2018  . IR REPLACE G-TUBE SIMPLE WO FLUORO  03/08/2019  . MODIFIED RADICAL NECK DISSECTION Left 09/27/2016   Levels 1 & 2  . PARTIAL GLOSSECTOMY Left 09/27/2016  Left hemi partial glossectomy  . SPINE SURGERY  01/20/2016   fusion  . TONSILLECTOMY    . tracheotomy  09/27/2016  . TUBAL LIGATION  06/1988    There were no vitals filed for this visit.   Subjective Assessment - 05/16/19 1547    Subjective  Kristen [name] is very interested in strengthening post-surgery.  Weight-bearing (standing, walking, sitting) activities are limited by endurance.    Pertinent History  Multiple spine surgeries including fusion from L3 to S1.    Limitations  Sitting;House hold activities;Reading;Lifting;Standing;Walking;Writing    How long can you sit comfortably?  5 minutes    How long can you stand comfortably?  30 minutes    How long can you walk comfortably?  30 minutes    Patient Stated Goals  Be more active, walk better, sit and stand for longer periods of  time.    Currently in Pain?  Yes    Pain Score  3     Pain Location  Back    Pain Orientation  Mid;Lower    Pain Descriptors / Indicators  Aching    Pain Type  Chronic pain    Pain Radiating Towards  R LE cramping with driving, sitting or lying down too long.    Pain Onset  More than a month ago    Pain Frequency  Constant    Aggravating Factors   Prolonged postures    Pain Relieving Factors  Hot shower, medications, change of position    Effect of Pain on Daily Activities  Very limited endurance, particularly sitting         [facility] PT Assessment - 05/16/19 0001      Assessment   Medical Diagnosis  Post-surgical back pain    Referring Provider (PT)  Jessy Oto    Onset Date/Surgical Date  11/06/18    Next MD Visit  06/04/2019      Precautions   Precautions  Back    Precaution Booklet Issued  No    Precaution Comments  Avoid prolonged postures, bending, lifting and twisting      Restrictions   Other Position/Activity Restrictions  Back precautions      Balance Screen   Has the patient fallen in the past 6 months  No    Has the patient had a decrease in activity level because of a fear of falling?   Yes    Is the patient reluctant to leave their home because of a fear of falling?   Yes      [place]  Private residence    Living Arrangements  Other relatives      Prior Function   Level of Independence  Needs assistance with ADLs    Vocation  On disability      Cognition   Overall Cognitive Status  Within Functional Limits for tasks assessed      Observation/Other Assessments   Observations  20 degrees flexed in standing      Posture/Postural Control   Posture/Postural Control  Postural limitations    Postural Limitations  Rounded Shoulders;Forward head;Decreased lumbar lordosis      ROM / Strength   AROM / PROM / Strength  AROM      AROM   Overall AROM   Deficits    Overall AROM Comments  Trunk extension AROM -20 degrees     AROM Assessment Site  Hip;Lumbar    Right/Left Hip  Left;Right    Right Hip Flexion  80    Right Hip External Rotation   --   Too tight to assess (need 90 degrees hip flexion)   Right Hip Internal Rotation   --   Same as ER   Left Hip Flexion  90    Left Hip External Rotation   34    Left Hip Internal Rotation   3    Lumbar Extension  -20      Flexibility   Soft Tissue Assessment /Muscle Length  yes    Hamstrings  30 degrees B      Transfers   Transfers  Sit to Stand    Sit to Stand  --   Very slow (15-20 seconds for 1)               Outpatient Re[facility]  Lymphedema Life Impact Scale Total Score  38.24 %      Objective measurements completed on examination: See above findings.              PT Education - 05/16/19 1638    Education Details  Postural education, importance of frequent position changes, use of a soft lumbar roll, log roll and walking program prescription.    Person(s) Educated  Patient    Methods  Explanation;Demonstration;Tactile cues;Verbal cues;Handout    Comprehension  Verbalized understanding;Returned demonstration;Verbal cues required;Tactile cues required;Need further instruction       PT Short Term Goals - 05/16/19 1647      PT SHORT TERM GOAL #1   Title  Favor will be independent with her prescribed HEP.    Time  4    Period  Weeks    Status  New    Target Date  06/13/19        PT Long Term Goals - 05/16/19 1648      PT LONG TERM GOAL #1   Title  Kristen [name] will be able to sit comfortably for 30+ minutes including being able to drive to and from [place] (can currently only drive 1 way).    Time  8    Period  Weeks    Status  New    Target Date  07/11/19      PT LONG TERM GOAL #2   Title  Kristen [name] will report back pain consistently 0-4/10 on the Numeric Pain Rating Scale.    Time  8    Period  Weeks    Status  New    Target Date  07/11/19      PT  LONG TERM GOAL #3   Title  Kristen [name] will improve standing posture to neutral extension (currently 20 degrees bent) to allow improved standing and walking to an hour or better (currently 30 minutes or less).    Time  8    Period  Weeks    Status  New    Target Date  07/11/19      PT LONG TERM GOAL #4   Title  Kristen [name] will improve LE flexibility for hip flexors (100); hamstrings (45) and hip rotation (IR 5/ER 40) to allow improved independence with ADLs and better uninterrupted sleep.    Time  8    Period  Weeks    Status  New    Target Date  07/11/19             Plan - 05/16/19 1641    Clinical Impression Statement  Kristen [name] is very deconditioned and weak.  She just had her 3rd spinal  surgery and is fused T3-S1.  She has core and LE weakness that is limiting his sitting, standing and walking.  She is slow with transfers and needs help with basic ADLs.  She is a great candidate for supervised physical therapy.    Personal Factors and Comorbidities  Fitness;Comorbidity 2    Comorbidities  Cancer survivor, hernia, deconditioning.    Examination-Activity Limitations  Squat;Hygiene/Grooming;Bed Mobility;Lift;Stairs;Bend;Locomotion Level;Stand;Bathing;Toileting;Carry;Transfers;Sit;Dressing;Sleep    Examination-Participation Restrictions  Laundry;Shop;Cleaning;Meal Prep;Driving    Stability/Clinical Decision Making  Evolving/Moderate complexity    Clinical Decision Making  Moderate    Rehab Potential  Good    PT Frequency  2x / week    PT Duration  8 weeks    PT Treatment/Interventions  ADLs/Self Care Home Management;Cryotherapy;Moist Heat;Functional mobility training;Neuromuscular re-education;Therapeutic exercise;Therapeutic activities;Patient/family education;Manual techniques;Passive range of motion    PT Next Visit Plan  General conditioning and postural/body mechanics education.  Particular attention on tight hips and weak core.    PT Home Exercise Plan  See patient information.     Consulted and Agree with Plan of Care  Patient       Patient will benefit from skilled therapeutic intervention in order to improve the following deficits and impairments:  Abnormal gait, Improper body mechanics, Pain, Postural dysfunction, Increased muscle spasms, Decreased activity tolerance, Decreased endurance, Decreased range of motion, Decreased strength, Impaired flexibility, Difficulty walking  Visit Diagnosis: Muscle weakness (generalized) - Plan: PT plan of care cert/re-cert  Chronic bilateral low back pain with right-sided sciatica - Plan: PT plan of care cert/re-cert  Difficulty in walking, not elsewhere classified - Plan: PT plan of care cert/re-cert  Abnormal posture - Plan: PT plan of care cert/re-cert     Problem List Patient Active Problem List   Diagnosis Date Noted  . Peripheral edema 03/12/2019  . Iron deficiency anemia 12/11/2018  . Loosening of hardware in spine ([employer])   . Other spondylosis with radiculopathy, lumbar region   . History of lumbar spinal fusion 11/06/2018  . UTI (urinary tract infection) 05/15/2017  . 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 Malignant neoplasm of base of tongue ([place]) 10/29/2016  . 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[place]) 10/29/2016  . Headache associated with sexual activity 05/14/2016  . Tongue lesion 05/14/2016  . Thrombocytopenia ([place]) 01/22/2016  . Chronic diarrhea 01/22/2016  . Chest pain   . Essential hypertension   . Spinal stenosis of lumbar region 01/20/2016    Class: Chronic  . DDD (degenerative disc disease), lumbar 01/20/2016    Class: Chronic  . Spinal stenosis of lumbar region with neurogenic claudication 01/20/2016  . Low back pain 12/22/2015  . Hypersomnolence 10/13/2015  . Muscle cramp 08/01/2015  . GERD (gastroesophageal reflux disease) 07/09/2015  . Excessive daytime sleepiness 06/18/2015  . Morbid obesity with BMI of 40.0-44.9, adult [facility]) 01/08/2013    Farley Ly PT,  MPT 05/16/2019, 4:57 PM  [facility] Physical Therapy [address] [place], [place], [address] Phone: [phone]   Fax:  [phone]  Name: XITLALLY MOONEYHAM MRN: [id number] Date of Birth: [birth year]

## 2019-05-17 ENCOUNTER — Encounter: Payer: Self-pay | Admitting: Family Medicine

## 2019-05-17 ENCOUNTER — Ambulatory Visit: Payer: BC Managed Care – PPO | Admitting: Family Medicine

## 2019-05-17 NOTE — Telephone Encounter (Signed)
Done

## 2019-05-21 ENCOUNTER — Other Ambulatory Visit: Payer: Self-pay

## 2019-05-21 ENCOUNTER — Ambulatory Visit (INDEPENDENT_AMBULATORY_CARE_PROVIDER_SITE_OTHER): Payer: BC Managed Care – PPO | Admitting: Rehabilitative and Restorative Service Providers"

## 2019-05-21 ENCOUNTER — Encounter: Payer: Self-pay | Admitting: Rehabilitative and Restorative Service Providers"

## 2019-05-21 DIAGNOSIS — R293 Abnormal posture: Secondary | ICD-10-CM | POA: Diagnosis not present

## 2019-05-21 DIAGNOSIS — M6281 Muscle weakness (generalized): Secondary | ICD-10-CM

## 2019-05-21 DIAGNOSIS — G8929 Other chronic pain: Secondary | ICD-10-CM

## 2019-05-21 DIAGNOSIS — R262 Difficulty in walking, not elsewhere classified: Secondary | ICD-10-CM

## 2019-05-21 DIAGNOSIS — M5441 Lumbago with sciatica, right side: Secondary | ICD-10-CM | POA: Diagnosis not present

## 2019-05-21 NOTE — Therapy (Signed)
[facility] Physical Therapy [address] [place], [place], [address] Phone: [phone]   Fax:  [phone]  Physical Therapy Treatment  Patient Details  Name: Kristen [name] MRN: [id number] Date of Birth: 08/04/1965 Referring Provider (PT): Jessy Oto   Encounter Date: 05/21/2019  PT End of Session - 05/21/19 1443    Visit Number  2    Number of Visits  16    Date for PT Re-Evaluation  07/11/19    PT Start Time  9379    PT Stop Time  1430    PT Time Calculation (min)  45 min    Activity Tolerance  No increased pain;Patient tolerated treatment well    Behavior During Therapy  [facility] for tasks assessed/performed       Past Medical History:  Diagnosis Date  . Allergy   . Anemia    Iron deficiency  . Anxiety   . Arthritis    back, left shoulder  . Blood transfusion without reported diagnosis   . Chicken pox   . Depression   . Diarrhea    takes Imodium daily  . GERD (gastroesophageal reflux disease)   . H/O hiatal hernia   . Headache(784.0)   . History of kidney stones    1996ish  . History of radiation therapy 11/16/16- 01/01/17   Base of Tongue/ 66 gy in 33 fractions/ Dose: 2 Gy  . Hypertension   . Iron deficiency anemia 12/11/2018  . Migraine    none for 5 years (as of 01/13/16)  . OSA (obstructive sleep apnea) 10/13/2015   unable to get cpap, plans to get one in 2018  . Pneumonia   . Restless legs   . Scoliosis   . Shingles 08/28/2013  . Shortness of breath    with exertion  . Sleep apnea   . Tongue cancer ([place])    tongue cancer    Past Surgical History:  Procedure Laterality Date  . BACK SURGERY  07/21/1978  . CHOLECYSTECTOMY N/A 08/15/2013   Procedure: LAPAROSCOPIC CHOLECYSTECTOMY WITH INTRAOPERATIVE CHOLANGIOGRAM;  Surgeon: Gwenyth Ober, MD;  Location: [place];  Service: General;  Laterality: N/A;  . COLONOSCOPY N/A 01/09/2013   Procedure: COLONOSCOPY;  Surgeon: Beryle Beams, MD;  Location: [place];  Service: Endoscopy;   Laterality: N/A;  . COLONOSCOPY  03/2018  . DIRECT LARYNGOSCOPY  07/2016   Dr. Nicolette Bang [facility]  . ESOPHAGOGASTRODUODENOSCOPY  03/2018  . ESOPHAGOGASTRODUODENOSCOPY (EGD) WITH PROPOFOL N/A 04/24/2019   Procedure: ESOPHAGOGASTRODUODENOSCOPY (EGD) WITH PROPOFOL;  Surgeon: Gatha Mayer, MD;  [facility];  Service: Endoscopy;  Laterality: N/A;  . GASTROSTOMY TUBE PLACEMENT  09/27/2016  . GIVENS CAPSULE STUDY N/A 04/24/2019   Procedure: GIVENS CAPSULE STUDY;  Surgeon: Gatha Mayer, MD;  [facility];  Service: Endoscopy;  Laterality: N/A;  . HERNIA REPAIR Left 1981  . IR PATIENT EVAL TECH 0-60 MINS  12/13/2016  . IR PATIENT EVAL TECH 0-60 MINS  04/11/2017  . IR PATIENT EVAL TECH 0-60 MINS  03/24/2018  . IR PATIENT EVAL TECH 0-60 MINS  11/23/2018  . IR REPLACE G-TUBE SIMPLE WO FLUORO  12/12/2017  . IR REPLACE G-TUBE SIMPLE WO FLUORO  03/29/2018  . IR REPLACE G-TUBE SIMPLE WO FLUORO  06/29/2018  . IR REPLACE G-TUBE SIMPLE WO FLUORO  11/02/2018  . IR REPLACE G-TUBE SIMPLE WO FLUORO  03/08/2019  . MODIFIED RADICAL NECK DISSECTION Left 09/27/2016   Levels 1 & 2  . PARTIAL GLOSSECTOMY Left 09/27/2016  Left hemi partial glossectomy  . SPINE SURGERY  01/20/2016   fusion  . TONSILLECTOMY    . tracheotomy  09/27/2016  . TUBAL LIGATION  06/1988    There were no vitals filed for this visit.  Subjective Assessment - 05/21/19 1435    Subjective  Kristen [name] notes good compliance with her home walking and exercise program.  She feels "surprisingly good" given the rain and her typical achiness with rain.    Pertinent History  Multiple spine surgeries including fusion from L3 to S1.    Limitations  Sitting;House hold activities;Reading;Lifting;Standing;Walking;Writing    How long can you sit comfortably?  5 minutes    How long can you stand comfortably?  30 minutes    How long can you walk comfortably?  30 minutes    Patient Stated Goals  Be more active, walk better, sit and stand for  longer periods of time.    Currently in Pain?  Yes    Pain Score  3     Pain Onset  More than a month ago                  Outpatient [facility] from [facility]  Lymphedema Life Impact Scale Total Score  38.24 %           [facility] Adult PT Treatment/Exercise - 05/21/19 0001      Posture/Postural Control   Posture/Postural Control  Postural limitations    Postural Limitations  Rounded Shoulders;Forward head;Decreased lumbar lordosis   Worked on standing & sitting posture along with basic lifts   Posture Comments  --   Golfer's and diagonal lifts practiced.  Need work.     Self-Care   Self-Care  Lifting   See postural section.     Exercises   Exercises  Lumbar;Knee/Hip      Lumbar Exercises: Stretches   Active Hamstring Stretch  5 reps;20 seconds    Single Knee to Chest Stretch  5 reps;20 seconds   Other leg straight with all supine stretches     Lumbar Exercises: Standing   Heel Raises  10 reps;3 seconds   Heel to toe raises with ball for core stabilization   Theraband Level (Scapular Retraction)  Level 2 (Red)   10X 3 seconds   Other Standing Lumbar Exercises  --   Shoulder blade pinches 10X 5 seconds   Other Standing Lumbar Exercises  --   Standing lumbar extension AROM (hips forward) 10X 3 seconds     Lumbar Exercises: Seated   Sit to Stand Limitations  2 sets of 5 slow eccentrics      Lumbar Exercises: Supine   Bridge  10 reps;5 seconds             PT Education - 05/21/19 1442    Education Details  Lift mechanics and review of HEP    Person(s) Educated  Patient    Methods  Explanation;Demonstration;Tactile cues;Verbal cues;Handout    Comprehension  Verbalized understanding;Need further instruction;Verbal cues required;Returned demonstration;Tactile cues required       PT Short Term Goals - 05/16/19 1647      PT SHORT TERM GOAL #1   Title  Kristen [name] will be independent with her prescribed HEP.     Time  4    Period  Weeks    Status  New    Target Date  06/13/19        PT Long Term Goals - 05/16/19 1648  PT LONG TERM GOAL #1   Title  Kristen [name] will be able to sit comfortably for 30+ minutes including being able to drive to and from [place] (can currently only drive 1 way).    Time  8    Period  Weeks    Status  New    Target Date  07/11/19      PT LONG TERM GOAL #2   Title  Kristen [name] will report back pain consistently 0-4/10 on the Numeric Pain Rating Scale.    Time  8    Period  Weeks    Status  New    Target Date  07/11/19      PT LONG TERM GOAL #3   Title  Kristen [name] will improve standing posture to neutral extension (currently 20 degrees bent) to allow improved standing and walking to an hour or better (currently 30 minutes or less).    Time  8    Period  Weeks    Status  New    Target Date  07/11/19      PT LONG TERM GOAL #4   Title  Kristen [name] will improve LE flexibility for hip flexors (100); hamstrings (45) and hip rotation (IR 5/ER 40) to allow improved independence with ADLs and better uninterrupted sleep.    Time  8    Period  Weeks    Status  New    Target Date  07/11/19            Plan - 05/21/19 1444    Clinical Impression Statement  Kristen [name] reports good early compliance.  Movement patterns were more efficient and less painful (subjectively) today.  She will benefit from continued general strength work focused on her spine and legs along with postural and body mechanics work to be more independent with ADLs.    Personal Factors and Comorbidities  Fitness;Comorbidity 2    Comorbidities  Cancer survivor, hernia, deconditioning.    Examination-Activity Limitations  Squat;Hygiene/Grooming;Bed Mobility;Lift;Stairs;Bend;Locomotion Level;Stand;Bathing;Toileting;Carry;Transfers;Sit;Dressing;Sleep    Examination-Participation Restrictions  Laundry;Shop;Cleaning;Meal Prep;Driving    Stability/Clinical Decision Making  Evolving/Moderate complexity     Rehab Potential  Good    PT Frequency  2x / week    PT Duration  8 weeks    PT Treatment/Interventions  ADLs/Self Care Home Management;Cryotherapy;Moist Heat;Functional mobility training;Neuromuscular re-education;Therapeutic exercise;Therapeutic activities;Patient/family education;Manual techniques;Passive range of motion    PT Next Visit Plan  General conditioning and postural/body mechanics education.  Particular attention on tight hips and weak core.    PT Home Exercise Plan  Added 2 activities today (see patient instruction)    Consulted and Agree with Plan of Care  Patient       Patient will benefit from skilled therapeutic intervention in order to improve the following deficits and impairments:  Abnormal gait, Improper body mechanics, Pain, Postural dysfunction, Increased muscle spasms, Decreased activity tolerance, Decreased endurance, Decreased range of motion, Decreased strength, Impaired flexibility, Difficulty walking  Visit Diagnosis: Muscle weakness (generalized)  Chronic bilateral low back pain with right-sided sciatica  Difficulty in walking, not elsewhere classified  Abnormal posture     Problem List Patient Active Problem List   Diagnosis Date Noted  . Peripheral edema 03/12/2019  . Iron deficiency anemia 12/11/2018  . Loosening of hardware in spine ([place])   . Other spondylosis with radiculopathy, lumbar region   . History of lumbar spinal fusion 11/06/2018  . UTI (urinary tract infection) 05/15/2017  . 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 Malignant neoplasm of  base of tongue ([name]) 10/29/2016  . 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[place]) 10/29/2016  . Headache associated with sexual activity 05/14/2016  . Tongue lesion 05/14/2016  . Thrombocytopenia ([place]) 01/22/2016  . Chronic diarrhea 01/22/2016  . Chest pain   . Essential hypertension   . Spinal stenosis of lumbar region 01/20/2016    Class: Chronic  . DDD (degenerative disc  disease), lumbar 01/20/2016    Class: Chronic  . Spinal stenosis of lumbar region with neurogenic claudication 01/20/2016  . Low back pain 12/22/2015  . Hypersomnolence 10/13/2015  . Muscle cramp 08/01/2015  . GERD (gastroesophageal reflux disease) 07/09/2015  . Excessive daytime sleepiness 06/18/2015  . Morbid obesity with BMI of 40.0-44.9, adult [facility]) 01/08/2013    Farley Ly  PT, MPT 05/21/2019, 2:49 PM  [facility] Physical Therapy [address] [place], [place], [address] Phone: [phone]   Fax:  [phone]  Name: Kristen [name] MRN: [id number] Date of Birth: [birth year]

## 2019-05-21 NOTE — Patient Instructions (Signed)
Access Code: [email or web address] URL: [email or web address] Date: 05/21/2019 Prepared by: Vista Mink  Exercises Sit to Stand - 5 x daily - 7 x weekly - 1 sets - 2-5 reps Supine Hamstring Stretch - 1-2 x daily - 7 x weekly - 1 sets - 5 reps - 20 seconds hold

## 2019-05-25 ENCOUNTER — Ambulatory Visit: Payer: BC Managed Care – PPO | Admitting: Physical Medicine and Rehabilitation

## 2019-05-25 ENCOUNTER — Other Ambulatory Visit (HOSPITAL_COMMUNITY): Payer: Self-pay | Admitting: Radiology

## 2019-05-25 DIAGNOSIS — Z931 Gastrostomy status: Secondary | ICD-10-CM

## 2019-05-28 ENCOUNTER — Ambulatory Visit (HOSPITAL_COMMUNITY)
Admission: RE | Admit: 2019-05-28 | Discharge: 2019-05-28 | Disposition: A | Discharge status: Home / Self Care | Payer: BC Managed Care – PPO | Source: Ambulatory Visit | Attending: Radiology | Admitting: Radiology

## 2019-05-28 ENCOUNTER — Encounter: Payer: Self-pay | Admitting: Radiology

## 2019-05-28 ENCOUNTER — Other Ambulatory Visit: Payer: Self-pay

## 2019-05-28 ENCOUNTER — Ambulatory Visit: Payer: BC Managed Care – PPO | Admitting: Rehabilitative and Restorative Service Providers"

## 2019-05-28 DIAGNOSIS — M5441 Lumbago with sciatica, right side: Secondary | ICD-10-CM

## 2019-05-28 DIAGNOSIS — R293 Abnormal posture: Secondary | ICD-10-CM

## 2019-05-28 DIAGNOSIS — Z981 Arthrodesis status: Secondary | ICD-10-CM

## 2019-05-28 DIAGNOSIS — Z931 Gastrostomy status: Secondary | ICD-10-CM

## 2019-05-28 DIAGNOSIS — M6281 Muscle weakness (generalized): Secondary | ICD-10-CM | POA: Diagnosis not present

## 2019-05-28 DIAGNOSIS — G8929 Other chronic pain: Secondary | ICD-10-CM

## 2019-05-28 DIAGNOSIS — R262 Difficulty in walking, not elsewhere classified: Secondary | ICD-10-CM | POA: Diagnosis not present

## 2019-05-28 HISTORY — PX: IR PATIENT EVAL TECH 0-60 MINS: IMG5564

## 2019-05-28 MED ORDER — SILVER NITRATE-POT NITRATE 75-25 % EX MISC
CUTANEOUS | Status: AC
  Filled 2019-05-28: qty 10

## 2019-05-28 NOTE — Therapy (Signed)
[facility] Physical Therapy [address] [place], [place], [address] Phone: [phone]   Fax:  [phone]  Physical Therapy Treatment  Patient Details  Name: ADRIENA MANFRE MRN: [id number] Date of Birth: Dec 18, 1965 Referring Provider (PT): Jessy Oto   Encounter Date: 05/28/2019  PT End of Session - 05/28/19 1644    Visit Number  3    Number of Visits  16    Date for PT Re-Evaluation  07/11/19    PT Start Time  1430    PT Stop Time  3903    PT Time Calculation (min)  45 min    Activity Tolerance  No increased pain;Patient tolerated treatment well    Behavior During Therapy  [facility] for tasks assessed/performed       Past Medical History:  Diagnosis Date  . Allergy   . Anemia    Iron deficiency  . Anxiety   . Arthritis    back, left shoulder  . Blood transfusion without reported diagnosis   . Chicken pox   . Depression   . Diarrhea    takes Imodium daily  . GERD (gastroesophageal reflux disease)   . H/O hiatal hernia   . Headache(784.0)   . History of kidney stones    1996ish  . History of radiation therapy 11/16/16- 01/01/17   Base of Tongue/ 66 gy in 33 fractions/ Dose: 2 Gy  . Hypertension   . Iron deficiency anemia 12/11/2018  . Migraine    none for 5 years (as of 01/13/16)  . OSA (obstructive sleep apnea) 10/13/2015   unable to get cpap, plans to get one in 2018  . Pneumonia   . Restless legs   . Scoliosis   . Shingles 08/28/2013  . Shortness of breath    with exertion  . Sleep apnea   . Tongue cancer ([place])    tongue cancer    Past Surgical History:  Procedure Laterality Date  . BACK SURGERY  07/21/1978  . CHOLECYSTECTOMY N/A 08/15/2013   Procedure: LAPAROSCOPIC CHOLECYSTECTOMY WITH INTRAOPERATIVE CHOLANGIOGRAM;  Surgeon: Gwenyth Ober, MD;  Location: [place];  Service: General;  Laterality: N/A;  . COLONOSCOPY N/A 01/09/2013   Procedure: COLONOSCOPY;  Surgeon: Beryle Beams, MD;  Location: [place];  Service: Endoscopy;   Laterality: N/A;  . COLONOSCOPY  03/2018  . DIRECT LARYNGOSCOPY  07/2016   Dr. Nicolette Bang [facility]  . ESOPHAGOGASTRODUODENOSCOPY  03/2018  . ESOPHAGOGASTRODUODENOSCOPY (EGD) WITH PROPOFOL N/A 04/24/2019   Procedure: ESOPHAGOGASTRODUODENOSCOPY (EGD) WITH PROPOFOL;  Surgeon: Gatha Mayer, MD;  [facility];  Service: Endoscopy;  Laterality: N/A;  . GASTROSTOMY TUBE PLACEMENT  09/27/2016  . GIVENS CAPSULE STUDY N/A 04/24/2019   Procedure: GIVENS CAPSULE STUDY;  Surgeon: Gatha Mayer, MD;  [facility];  Service: Endoscopy;  Laterality: N/A;  . HERNIA REPAIR Left 1981  . IR PATIENT EVAL TECH 0-60 MINS  12/13/2016  . IR PATIENT EVAL TECH 0-60 MINS  04/11/2017  . IR PATIENT EVAL TECH 0-60 MINS  03/24/2018  . IR PATIENT EVAL TECH 0-60 MINS  11/23/2018  . IR PATIENT EVAL TECH 0-60 MINS  05/28/2019  . IR REPLACE G-TUBE SIMPLE WO FLUORO  12/12/2017  . IR REPLACE G-TUBE SIMPLE WO FLUORO  03/29/2018  . IR REPLACE G-TUBE SIMPLE WO FLUORO  06/29/2018  . IR REPLACE G-TUBE SIMPLE WO FLUORO  11/02/2018  . IR REPLACE G-TUBE SIMPLE WO FLUORO  03/08/2019  . MODIFIED RADICAL NECK DISSECTION Left 09/27/2016   Levels 1 &  2  . PARTIAL GLOSSECTOMY Left 09/27/2016   Left hemi partial glossectomy  . SPINE SURGERY  01/20/2016   fusion  . TONSILLECTOMY    . tracheotomy  09/27/2016  . TUBAL LIGATION  06/1988    There were no vitals filed for this visit.  Subjective Assessment - 05/28/19 1431    Subjective  Talma was good with her home program and with her walking over the weekend.    Pertinent History  Multiple spine surgeries including fusion from L3 to S1.    Limitations  Sitting;House hold activities;Reading;Lifting;Standing;Walking;Writing    How long can you sit comfortably?  5 minutes    How long can you stand comfortably?  30 minutes    How long can you walk comfortably?  30 minutes    Patient Stated Goals  Be more active, walk better, sit and stand for longer periods of time.     Currently in Pain?  Yes    Pain Score  2     Pain Location  Back    Pain Orientation  Mid;Lower    Pain Descriptors / Indicators  Aching    Pain Type  Chronic pain    Pain Radiating Towards  Better with sitting as long as she doesn't sit too long.    Pain Onset  More than a month ago    Pain Frequency  Intermittent    Aggravating Factors   Prolonged postures    Pain Relieving Factors  Change of position.    Effect of Pain on Daily Activities  Slow with walking and endurance is poor.                  Outpatient [facility]  Lymphedema Life Impact Scale Total Score  38.24 %           [facility] Adult PT Treatment/Exercise - 05/28/19 0001      Self-Care   Self-Care  --   Discussed sitting mechanics, lumbar roll and frequent change     Therapeutic Activites    Therapeutic Activities  Lifting   Standing hip hike (quadratus and glut med) 2X 5  3 seconds     Exercises   Exercises  Lumbar;Knee/Hip      Lumbar Exercises: Stretches   Active Hamstring Stretch  5 reps;20 seconds    Single Knee to Chest Stretch  5 reps;20 seconds      Lumbar Exercises: Machines for Strengthening   Leg Press  2 sets of 10 with 75#      Lumbar Exercises: Standing   Heel Raises  10 reps;3 seconds    Theraband Level (Scapular Retraction)  Level 2 (Red)   10X 3 seconds   Other Standing Lumbar Exercises  --   Shoulder blade pinches 10X 5 seconds   Other Standing Lumbar Exercises  --   Standing lumbar extension AROM (hips forward) 10X 3 seconds     Lumbar Exercises: Seated   Sit to Stand Limitations  2 sets of 5 slow eccentrics      Lumbar Exercises: Supine   Bridge  10 reps;5 seconds             PT Education - 05/28/19 1644    Education Details  Again reviewed body mechanics and HEP    Person(s) Educated  Patient    Methods  Explanation;Demonstration;Tactile cues;Verbal cues    Comprehension  Verbalized  understanding;Need further instruction;Returned demonstration;Verbal cues required;Tactile cues required  PT Short Term Goals - 05/16/19 1647      PT SHORT TERM GOAL #1   Title  Shizuye will be independent with her prescribed HEP.    Time  4    Period  Weeks    Status  New    Target Date  06/13/19        PT Long Term Goals - 05/16/19 1648      PT LONG TERM GOAL #1   Title  Sylina will be able to sit comfortably for 30+ minutes including being able to drive to and from [place] (can currently only drive 1 way).    Time  8    Period  Weeks    Status  New    Target Date  07/11/19      PT LONG TERM GOAL #2   Title  Shelene will report back pain consistently 0-4/10 on the Numeric Pain Rating Scale.    Time  8    Period  Weeks    Status  New    Target Date  07/11/19      PT LONG TERM GOAL #3   Title  Crescentia will improve standing posture to neutral extension (currently 20 degrees bent) to allow improved standing and walking to an hour or better (currently 30 minutes or less).    Time  8    Period  Weeks    Status  New    Target Date  07/11/19      PT LONG TERM GOAL #4   Title  Shameca will improve LE flexibility for hip flexors (100); hamstrings (45) and hip rotation (IR 5/ER 40) to allow improved independence with ADLs and better uninterrupted sleep.    Time  8    Period  Weeks    Status  New    Target Date  07/11/19            Plan - 05/28/19 1645    Clinical Impression Statement  Philana was able to progress her core strengthening with hip abduction with pelvic stabilization (hike only-gluteus medius and quadratus lumborum activation).  She is feeling stronger with her walking although it is slow.  She will continue to benefit from strength work to help stabilize her complex spine and allow her to be more active and efficient with her ADLs.    Personal Factors and Comorbidities  Fitness;Comorbidity 2    Comorbidities  Cancer survivor, hernia,  deconditioning.    Examination-Activity Limitations  Squat;Hygiene/Grooming;Bed Mobility;Lift;Stairs;Bend;Locomotion Level;Stand;Bathing;Toileting;Carry;Transfers;Sit;Dressing;Sleep    Examination-Participation Restrictions  Laundry;Shop;Cleaning;Meal Prep;Driving    Stability/Clinical Decision Making  Evolving/Moderate complexity    Rehab Potential  Good    PT Frequency  2x / week    PT Duration  8 weeks    PT Treatment/Interventions  ADLs/Self Care Home Management;Cryotherapy;Moist Heat;Functional mobility training;Neuromuscular re-education;Therapeutic exercise;Therapeutic activities;Patient/family education;Manual techniques;Passive range of motion    PT Next Visit Plan  General conditioning and postural/body mechanics education.  Particular attention on tight hips and weak core.    PT Home Exercise Plan  Nothing added today to HEP, clinic only.    Consulted and Agree with Plan of Care  Patient       Patient will benefit from skilled therapeutic intervention in order to improve the following deficits and impairments:  Abnormal gait, Improper body mechanics, Pain, Postural dysfunction, Increased muscle spasms, Decreased activity tolerance, Decreased endurance, Decreased range of motion, Decreased strength, Impaired flexibility, Difficulty walking  Visit Diagnosis: Muscle weakness (generalized)  Chronic bilateral low back  pain with right-sided sciatica  Difficulty in walking, not elsewhere classified  Abnormal posture  History of lumbar spinal fusion     Problem List Patient Active Problem List   Diagnosis Date Noted  . Peripheral edema 03/12/2019  . Iron deficiency anemia 12/11/2018  . Loosening of hardware in spine ([place])   . Other spondylosis with radiculopathy, lumbar region   . History of lumbar spinal fusion 11/06/2018  . UTI (urinary tract infection) 05/15/2017  . 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 Malignant neoplasm of base of tongue ([place])  10/29/2016  . 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[place]) 10/29/2016  . Headache associated with sexual activity 05/14/2016  . Tongue lesion 05/14/2016  . Thrombocytopenia ([place]) 01/22/2016  . Chronic diarrhea 01/22/2016  . Chest pain   . Essential hypertension   . Spinal stenosis of lumbar region 01/20/2016    Class: Chronic  . DDD (degenerative disc disease), lumbar 01/20/2016    Class: Chronic  . Spinal stenosis of lumbar region with neurogenic claudication 01/20/2016  . Low back pain 12/22/2015  . Hypersomnolence 10/13/2015  . Muscle cramp 08/01/2015  . GERD (gastroesophageal reflux disease) 07/09/2015  . Excessive daytime sleepiness 06/18/2015  . Morbid obesity with BMI of 40.0-44.9, adult [facility]) 01/08/2013    Farley Ly PT, MPT 05/28/2019, 4:49 PM  [facility] Physical Therapy [address] [place], [place], [address] Phone: [phone]   Fax:  [phone]  Name: HILARIA TITSWORTH MRN: [id number] Date of Birth: 1965/04/09

## 2019-05-28 NOTE — Procedures (Signed)
Patient came in with complaint of pain at the G tube insertion site.  There is some granuloma tissue around the site.  This is a reoccurring issue for this patient and has been successfully treated with silver nitrate topical applications.  At the patients request , I applied the silver nitrate to the affected area. Rowe Robert PAC evaluated the area and suggested thin Vaseline applications for a few days on  the surrounding area that appeared red and irritated from the bumper.  She was satisfied with the treatment and will call IR she has any additional issues.

## 2019-05-30 ENCOUNTER — Ambulatory Visit (INDEPENDENT_AMBULATORY_CARE_PROVIDER_SITE_OTHER): Payer: BC Managed Care – PPO | Admitting: Rehabilitative and Restorative Service Providers"

## 2019-05-30 ENCOUNTER — Other Ambulatory Visit: Payer: Self-pay

## 2019-05-30 ENCOUNTER — Encounter: Payer: Self-pay | Admitting: Rehabilitative and Restorative Service Providers"

## 2019-05-30 DIAGNOSIS — R293 Abnormal posture: Secondary | ICD-10-CM

## 2019-05-30 DIAGNOSIS — R262 Difficulty in walking, not elsewhere classified: Secondary | ICD-10-CM | POA: Diagnosis not present

## 2019-05-30 DIAGNOSIS — M5441 Lumbago with sciatica, right side: Secondary | ICD-10-CM

## 2019-05-30 DIAGNOSIS — M6281 Muscle weakness (generalized): Secondary | ICD-10-CM | POA: Diagnosis not present

## 2019-05-30 DIAGNOSIS — G8929 Other chronic pain: Secondary | ICD-10-CM

## 2019-05-30 DIAGNOSIS — R609 Edema, unspecified: Secondary | ICD-10-CM

## 2019-05-30 DIAGNOSIS — Z981 Arthrodesis status: Secondary | ICD-10-CM

## 2019-05-30 NOTE — Therapy (Signed)
[facility] Physical Therapy [address] [place], [place], [address] Phone: [phone]   Fax:  [phone]  Physical Therapy Treatment  Patient Details  Name: Kristen [name] MRN: [id number] Date of Birth: 1965-03-09 Referring Provider (PT): Jessy Oto   Encounter Date: 05/30/2019  PT End of Session - 05/30/19 1530    Visit Number  4    Number of Visits  16    Date for PT Re-Evaluation  07/11/19    PT Start Time  5573    PT Stop Time  2202    PT Time Calculation (min)  54 min    Activity Tolerance  No increased pain;Patient tolerated treatment well    Behavior During Therapy  [facility] for tasks assessed/performed       Past Medical History:  Diagnosis Date  . Allergy   . Anemia    Iron deficiency  . Anxiety   . Arthritis    back, left shoulder  . Blood transfusion without reported diagnosis   . Chicken pox   . Depression   . Diarrhea    takes Imodium daily  . GERD (gastroesophageal reflux disease)   . H/O hiatal hernia   . Headache(784.0)   . History of kidney stones    1996ish  . History of radiation therapy 11/16/16- 01/01/17   Base of Tongue/ 66 gy in 33 fractions/ Dose: 2 Gy  . Hypertension   . Iron deficiency anemia 12/11/2018  . Migraine    none for 5 years (as of 01/13/16)  . OSA (obstructive sleep apnea) 10/13/2015   unable to get cpap, plans to get one in 2018  . Pneumonia   . Restless legs   . Scoliosis   . Shingles 08/28/2013  . Shortness of breath    with exertion  . Sleep apnea   . Tongue cancer ([place])    tongue cancer    Past Surgical History:  Procedure Laterality Date  . BACK SURGERY  07/21/1978  . CHOLECYSTECTOMY N/A 08/15/2013   Procedure: LAPAROSCOPIC CHOLECYSTECTOMY WITH INTRAOPERATIVE CHOLANGIOGRAM;  Surgeon: Gwenyth Ober, MD;  Location: [place];  Service: General;  Laterality: N/A;  . COLONOSCOPY N/A 01/09/2013   Procedure: COLONOSCOPY;  Surgeon: Beryle Beams, MD;  Location: [place];  Service: Endoscopy;   Laterality: N/A;  . COLONOSCOPY  03/2018  . DIRECT LARYNGOSCOPY  07/2016   Dr. Nicolette Bang [facility]  . ESOPHAGOGASTRODUODENOSCOPY  03/2018  . ESOPHAGOGASTRODUODENOSCOPY (EGD) WITH PROPOFOL N/A 04/24/2019   Procedure: ESOPHAGOGASTRODUODENOSCOPY (EGD) WITH PROPOFOL;  Surgeon: Gatha Mayer, MD;  [facility];  Service: Endoscopy;  Laterality: N/A;  . GASTROSTOMY TUBE PLACEMENT  09/27/2016  . GIVENS CAPSULE STUDY N/A 04/24/2019   Procedure: GIVENS CAPSULE STUDY;  Surgeon: Gatha Mayer, MD;  [facility];  Service: Endoscopy;  Laterality: N/A;  . HERNIA REPAIR Left 1981  . IR PATIENT EVAL TECH 0-60 MINS  12/13/2016  . IR PATIENT EVAL TECH 0-60 MINS  04/11/2017  . IR PATIENT EVAL TECH 0-60 MINS  03/24/2018  . IR PATIENT EVAL TECH 0-60 MINS  11/23/2018  . IR PATIENT EVAL TECH 0-60 MINS  05/28/2019  . IR REPLACE G-TUBE SIMPLE WO FLUORO  12/12/2017  . IR REPLACE G-TUBE SIMPLE WO FLUORO  03/29/2018  . IR REPLACE G-TUBE SIMPLE WO FLUORO  06/29/2018  . IR REPLACE G-TUBE SIMPLE WO FLUORO  11/02/2018  . IR REPLACE G-TUBE SIMPLE WO FLUORO  03/08/2019  . MODIFIED RADICAL NECK DISSECTION Left 09/27/2016   Levels 1 &  2  . PARTIAL GLOSSECTOMY Left 09/27/2016   Left hemi partial glossectomy  . SPINE SURGERY  01/20/2016   fusion  . TONSILLECTOMY    . tracheotomy  09/27/2016  . TUBAL LIGATION  06/1988    There were no vitals filed for this visit.  Subjective Assessment - 05/30/19 1435    Subjective  Darrielle mentioned that her dog died yesterday and she did not sleep well.  She has been sleeping on her couch because she does not "have the energy" to clean her room and use her bed.    Pertinent History  Multiple spine surgeries including fusion from L3 to S1.    Limitations  Sitting;House hold activities;Reading;Lifting;Standing;Walking;Writing    How long can you sit comfortably?  5 minutes    How long can you stand comfortably?  30 minutes    How long can you walk comfortably?  30 minutes     Patient Stated Goals  Be more active, walk better, sit and stand for longer periods of time.    Currently in Pain?  No/denies    Pain Onset  More than a month ago    Aggravating Factors   Change of position    Pain Relieving Factors  Movement and exercises    Effect of Pain on Daily Activities  Still limited endurance with all ADLs.                   Outpatient [facility]  Lymphedema Life Impact Scale Total Score  38.24 %           [facility] Adult PT Treatment/Exercise - 05/30/19 0001      Bed Mobility   Bed Mobility  Supine to Sit      Self-Care   Self-Care  --   Short attempts at cleaning c careful attention to mechanics     Therapeutic Activites    Therapeutic Activities  Lifting    Lifting  Body mechanics for cleaning 8 minutes      Exercises   Exercises  Lumbar;Knee/Hip      Lumbar Exercises: Stretches   Active Hamstring Stretch  5 reps;20 seconds    Single Knee to Chest Stretch  5 reps;20 seconds      Lumbar Exercises: Aerobic   Tread Mill  5 minutes at 1.0 MPH (8 minutes total with explanation and clean-up)      Lumbar Exercises: Machines for Strengthening   Leg Press  10X 75# and 10X 100#      Lumbar Exercises: Standing   Heel Raises  10 reps;3 seconds    Theraband Level (Scapular Retraction)  Level 3 (Green)   20X 3 seconds   Other Standing Lumbar Exercises  --   Shoulder blade pinches 10X 5 seconds   Other Standing Lumbar Exercises  --   Standing lumbar extension AROM (hips forward) 10X 3 seconds     Lumbar Exercises: Seated   Sit to Stand Limitations  2 sets of 5 slow eccentrics      Lumbar Exercises: Supine   Bridge  10 reps;5 seconds      Knee/Hip Exercises: Standing   Other Standing Knee Exercises  Hip hiking 2 sets of 10 3 seconds alternating             PT Education - 05/30/19 1534    Education Details  Worked on [employer] with cleaning to allow Nelle to  return to sleeping in a bed after cleaning her  room.    Person(s) Educated  Patient    Methods  Explanation;Demonstration;Verbal cues    Comprehension  Verbalized understanding;Returned demonstration;Verbal cues required;Need further instruction       PT Short Term Goals - 05/16/19 1647      PT SHORT TERM GOAL #1   Title  Raeana will be independent with her prescribed HEP.    Time  4    Period  Weeks    Status  New    Target Date  06/13/19        PT Long Term Goals - 05/16/19 1648      PT LONG TERM GOAL #1   Title  Clariza will be able to sit comfortably for 30+ minutes including being able to drive to and from [place] (can currently only drive 1 way).    Time  8    Period  Weeks    Status  New    Target Date  07/11/19      PT LONG TERM GOAL #2   Title  Kamyrah will report back pain consistently 0-4/10 on the Numeric Pain Rating Scale.    Time  8    Period  Weeks    Status  New    Target Date  07/11/19      PT LONG TERM GOAL #3   Title  Giulliana will improve standing posture to neutral extension (currently 20 degrees bent) to allow improved standing and walking to an hour or better (currently 30 minutes or less).    Time  8    Period  Weeks    Status  New    Target Date  07/11/19      PT LONG TERM GOAL #4   Title  Malesha will improve LE flexibility for hip flexors (100); hamstrings (45) and hip rotation (IR 5/ER 40) to allow improved independence with ADLs and better uninterrupted sleep.    Time  8    Period  Weeks    Status  New    Target Date  07/11/19            Plan - 05/30/19 1530    Clinical Impression Statement  Marieli is doing a good job with her HEP.  She is limited by fatigue and we discussed keeping an eye on her diet to make sure she has enough energy to participate in normal activities.  Due to digestive issues, she has been eating mostly instant potatoes and mac and cheese.  We discussed trying to work in small amounts of fresh  vegetables and some other protien sources to help with her energy levels and strength.  She will benefit from continued postural, body mechanics and strength work to meet long-term goals.    Personal Factors and Comorbidities  Fitness;Comorbidity 2    Comorbidities  Cancer survivor, hernia, deconditioning.    Examination-Activity Limitations  Squat;Hygiene/Grooming;Bed Mobility;Lift;Stairs;Bend;Locomotion Level;Stand;Bathing;Toileting;Carry;Transfers;Sit;Dressing;Sleep    Examination-Participation Restrictions  Laundry;Shop;Cleaning;Meal Prep;Driving    Stability/Clinical Decision Making  Evolving/Moderate complexity    Rehab Potential  Good    PT Frequency  2x / week    PT Duration  8 weeks    PT Treatment/Interventions  ADLs/Self Care Home Management;Cryotherapy;Moist Heat;Functional mobility training;Neuromuscular re-education;Therapeutic exercise;Therapeutic activities;Patient/family education;Manual techniques;Passive range of motion    PT Next Visit Plan  General conditioning and postural/body mechanics education.  Particular attention on tight hips and weak core.    PT Home Exercise Plan  Nothing added today to HEP, clinic only.    Consulted and Agree  with Plan of Care  Patient       Patient will benefit from skilled therapeutic intervention in order to improve the following deficits and impairments:  Abnormal gait, Improper body mechanics, Pain, Postural dysfunction, Increased muscle spasms, Decreased activity tolerance, Decreased endurance, Decreased range of motion, Decreased strength, Impaired flexibility, Difficulty walking  Visit Diagnosis: Muscle weakness (generalized)  Chronic bilateral low back pain with right-sided sciatica  Difficulty in walking, not elsewhere classified  Abnormal posture  History of lumbar spinal fusion  Peripheral edema     Problem List Patient Active Problem List   Diagnosis Date Noted  . Peripheral edema 03/12/2019  . Iron deficiency anemia  12/11/2018  . Loosening of hardware in spine ([place])   . Other spondylosis with radiculopathy, lumbar region   . History of lumbar spinal fusion 11/06/2018  . UTI (urinary tract infection) 05/15/2017  . 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 Malignant neoplasm of base of tongue ([place]) 10/29/2016  . 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[place]) 10/29/2016  . Headache associated with sexual activity 05/14/2016  . Tongue lesion 05/14/2016  . Thrombocytopenia ([place]) 01/22/2016  . Chronic diarrhea 01/22/2016  . Chest pain   . Essential hypertension   . Spinal stenosis of lumbar region 01/20/2016    Class: Chronic  . DDD (degenerative disc disease), lumbar 01/20/2016    Class: Chronic  . Spinal stenosis of lumbar region with neurogenic claudication 01/20/2016  . Low back pain 12/22/2015  . Hypersomnolence 10/13/2015  . Muscle cramp 08/01/2015  . GERD (gastroesophageal reflux disease) 07/09/2015  . Excessive daytime sleepiness 06/18/2015  . Morbid obesity with BMI of 40.0-44.9, adult [facility]) 01/08/2013    Farley Ly PT, MPT 05/30/2019, 3:42 PM  [facility] Physical Therapy [address] [place], [place], [address] Phone: [phone]   Fax:  [phone]  Name: ASHLIEGH PAREKH MRN: [id number] Date of Birth: [birth year]

## 2019-05-31 ENCOUNTER — Other Ambulatory Visit (INDEPENDENT_AMBULATORY_CARE_PROVIDER_SITE_OTHER): Payer: Self-pay | Admitting: Specialist

## 2019-06-04 ENCOUNTER — Encounter: Payer: Self-pay | Admitting: Specialist

## 2019-06-04 ENCOUNTER — Ambulatory Visit: Payer: BC Managed Care – PPO | Admitting: Specialist

## 2019-06-05 ENCOUNTER — Encounter: Payer: BC Managed Care – PPO | Admitting: Rehabilitative and Restorative Service Providers"

## 2019-06-05 ENCOUNTER — Telehealth: Payer: Self-pay | Admitting: Rehabilitative and Restorative Service Providers"

## 2019-06-05 NOTE — Telephone Encounter (Signed)
No show 06/05/2019.  Called and left message reminding of her next appointment and to please call if unable to attend.

## 2019-06-06 ENCOUNTER — Encounter: Payer: Self-pay | Admitting: Specialist

## 2019-06-11 ENCOUNTER — Other Ambulatory Visit: Payer: Self-pay | Admitting: Specialist

## 2019-06-14 ENCOUNTER — Ambulatory Visit: Payer: Self-pay

## 2019-06-14 ENCOUNTER — Encounter: Payer: Self-pay | Admitting: Physical Medicine and Rehabilitation

## 2019-06-14 ENCOUNTER — Ambulatory Visit (INDEPENDENT_AMBULATORY_CARE_PROVIDER_SITE_OTHER): Payer: BC Managed Care – PPO | Admitting: Physical Medicine and Rehabilitation

## 2019-06-14 ENCOUNTER — Other Ambulatory Visit: Payer: Self-pay

## 2019-06-14 DIAGNOSIS — M461 Sacroiliitis, not elsewhere classified: Secondary | ICD-10-CM | POA: Diagnosis not present

## 2019-06-14 MED ORDER — METHYLPREDNISOLONE ACETATE 80 MG/ML IJ SUSP
80.0000 mg | INTRAMUSCULAR | Status: AC | PRN
  Administered 2019-06-14: 80 mg via INTRA_ARTICULAR

## 2019-06-14 MED ORDER — BUPIVACAINE HCL 0.5 % IJ SOLN
2.0000 mL | INTRAMUSCULAR | Status: AC | PRN
  Administered 2019-06-14: 2 mL via INTRA_ARTICULAR

## 2019-06-14 NOTE — Progress Notes (Signed)
Kristen [name] - 54 y.o. female MRN [id number]  Date of birth: Jan 03, 1966  Office Visit Note: Visit Date: 06/14/2019 PCP: [name], MD Referred by: [name], MD  Subjective: Chief Complaint  Patient presents with  . Lower Back - Pain   HPI:  Kristen [name] is a 54 y.o. female who comes in today for planned Right sacroiliac injection with fluoroscopic guidance.  The patient has failed conservative care including home exercise, medications, time and activity modification.  This injection will be diagnostic and hopefully therapeutic.  Please see requesting physician notes for further details and justification.   ROS Otherwise per HPI.  Assessment & Plan: Visit Diagnoses:  1. Sacroiliitis ([place])     Plan: Findings:  Some increased pain post injection.    Meds & Orders: No orders of the defined types were placed in this encounter.   Orders Placed This Encounter  Procedures  . Sacroiliac Joint Inj  . XR C-ARM NO REPORT    Follow-up: No follow-ups on file.   Procedures: Sacroiliac Joint Inj on 06/14/2019 2:43 PM Indications: pain and diagnostic evaluation Details: 22 G 3.5 in needle, fluoroscopy-guided posterior approach Medications: 2 mL bupivacaine 0.5 %; 80 mg methylPREDNISolone acetate 80 MG/ML Outcome: tolerated well, no immediate complications  There was excellent flow of contrast producing a partial arthrogram of the sacroiliac joint.  Procedure, treatment alternatives, risks and benefits explained, specific risks discussed. Consent was given by the patient. Immediately prior to procedure a time out was called to verify the correct patient, procedure, equipment, support staff and site/side marked as required. Patient was prepped and draped in the usual sterile fashion.      No notes on file   Clinical History: MRI LUMBAR SPINE WITHOUT AND WITH CONTRAST  TECHNIQUE: Multiplanar and multiecho pulse sequences of the lumbar spine were obtained  without and with intravenous contrast.  CONTRAST:  23m MULTIHANCE GADOBENATE DIMEGLUMINE 529 MG/ML IV SOLN  COMPARISON:  Radiography 08/04/2017. CT abdomen 05/23/2017. MRI 11/19/2015.  FINDINGS: Segmentation: 5 lumbar type vertebral bodies as numbered previously.  Alignment:  Thoracolumbar curvature convex to the left.  Vertebrae:  No primary bone finding.  Conus medullaris and cauda equina: Conus extends to the L1 level. Conus and cauda equina appear normal.  Paraspinal and other soft tissues: None significant.  Disc levels:  Chronic thoracolumbar fusion. Residual paraspinous rod on the right without attachment devices. Apparent solid fusion from the lower thoracic region at least to L2. Sufficient patency of the canal and foramina.  L3-4: Previous discectomy and fusion procedure. Extensive artifact. Based on the previous CT, there is consideration of nonunion at this level. No compressive canal stenosis. Foramina obscured by artifact.  L4-5: Previous discectomy and fusion procedure. Fusion appears solid on the previous CT. No apparent compressive canal or foraminal narrowing.  L5-S1: Bulging of the disc more prominent towards the left with left foraminal encroachment by osteophyte and disc material that could affect the left L5 nerve. Foramen on the right is widely patent.  IMPRESSION: Distant thoracolumbar fusion. Residual Harrington rod segment on the right, without visible attachments. No stenosis at L2-3 or above.  Previous discectomy and fusion procedure at L3-4. Based on the CT scan of May, there may be nonunion at this level. No compressive canal stenosis. Foraminal detail limited because of hardware artifact.  Previous discectomy and fusion procedure at L4-5. Fusion looks solid on the previous CT. Sufficient patency of the canal and foramina.  L5-S1 left foraminal to extraforaminal  encroachment by osteophyte in disc material that could  affect the left L5 nerve.   Electronically Signed   By: Nelson Chimes M.D.   On: 01/08/2018 14:41     Objective:  VS:  HT:    WT:   BMI:     BP:   HR: bpm  TEMP: ( )  RESP:  Physical Exam Constitutional:      General: She is not in acute distress.    Appearance: Normal appearance. She is not ill-appearing.  HENT:     Head: Normocephalic and atraumatic.     Right Ear: External ear normal.     Left Ear: External ear normal.  Eyes:     Extraocular Movements: Extraocular movements intact.  Cardiovascular:     Rate and Rhythm: Normal rate.     Pulses: Normal pulses.  Musculoskeletal:     Right lower leg: No edema.     Left lower leg: No edema.     Comments: Patient has good distal strength with no pain over the greater trochanters.  No clonus or focal weakness. Positive Fortin finger test on right  Skin:    Findings: No erythema, lesion or rash.  Neurological:     General: No focal deficit present.     Mental Status: She is alert and oriented to person, place, and time.     Sensory: No sensory deficit.     Motor: No weakness or abnormal muscle tone.     Coordination: Coordination normal.  Psychiatric:        Mood and Affect: Mood normal.        Behavior: Behavior normal.      Imaging: No results found.

## 2019-06-14 NOTE — Progress Notes (Signed)
Pt states pain is in the right lower back with no groin pain. Last injection in back didn't help much  Numeric Pain Rating Scale and Functional Assessment Average Pain (5)   In the last MONTH (on 0-10 scale) has pain interfered with the following?  1. General activity like being  able to carry out your everyday physical activities such as walking, climbing stairs, carrying groceries, or moving a chair?  Rating(9)   +Driver, -BT, -Dye Allergies.

## 2019-06-15 ENCOUNTER — Encounter: Payer: Self-pay | Admitting: Rehabilitative and Restorative Service Providers"

## 2019-06-15 ENCOUNTER — Ambulatory Visit (INDEPENDENT_AMBULATORY_CARE_PROVIDER_SITE_OTHER): Payer: BC Managed Care – PPO | Admitting: Rehabilitative and Restorative Service Providers"

## 2019-06-15 DIAGNOSIS — R293 Abnormal posture: Secondary | ICD-10-CM | POA: Diagnosis not present

## 2019-06-15 DIAGNOSIS — M6281 Muscle weakness (generalized): Secondary | ICD-10-CM | POA: Diagnosis not present

## 2019-06-15 DIAGNOSIS — M5441 Lumbago with sciatica, right side: Secondary | ICD-10-CM

## 2019-06-15 DIAGNOSIS — R262 Difficulty in walking, not elsewhere classified: Secondary | ICD-10-CM | POA: Diagnosis not present

## 2019-06-15 DIAGNOSIS — G8929 Other chronic pain: Secondary | ICD-10-CM

## 2019-06-15 NOTE — Patient Instructions (Signed)
Access Code: [email or web address] URL: [email or web address] Date: 06/15/2019 Prepared by: Vista Mink  Exercises Standing Hip Hiking - 2 x daily - 7 x weekly - 2 sets - 10 reps - 3 seconds hold

## 2019-06-15 NOTE — Therapy (Signed)
[facility] Physical Therapy [address] [place], [place], [address] Phone: [phone]   Fax:  [phone]  Physical Therapy Treatment  Patient Details  Name: DELTHA BERNALES MRN: [id number] Date of Birth: 1965/01/31 Referring Provider (PT): Jessy Oto   Encounter Date: 06/15/2019  PT End of Session - 06/15/19 1620    Visit Number  5    Number of Visits  16    Date for PT Re-Evaluation  07/11/19    PT Start Time  1430    PT Stop Time  8756    PT Time Calculation (min)  45 min    Activity Tolerance  No increased pain;Patient tolerated treatment well    Behavior During Therapy  [facility] for tasks assessed/performed       Past Medical History:  Diagnosis Date  . Allergy   . Anemia    Iron deficiency  . Anxiety   . Arthritis    back, left shoulder  . Blood transfusion without reported diagnosis   . Chicken pox   . Depression   . Diarrhea    takes Imodium daily  . GERD (gastroesophageal reflux disease)   . H/O hiatal hernia   . Headache(784.0)   . History of kidney stones    1996ish  . History of radiation therapy 11/16/16- 01/01/17   Base of Tongue/ 66 gy in 33 fractions/ Dose: 2 Gy  . Hypertension   . Iron deficiency anemia 12/11/2018  . Migraine    none for 5 years (as of 01/13/16)  . OSA (obstructive sleep apnea) 10/13/2015   unable to get cpap, plans to get one in 2018  . Pneumonia   . Restless legs   . Scoliosis   . Shingles 08/28/2013  . Shortness of breath    with exertion  . Sleep apnea   . Tongue cancer ([place])    tongue cancer    Past Surgical History:  Procedure Laterality Date  . BACK SURGERY  07/21/1978  . CHOLECYSTECTOMY N/A 08/15/2013   Procedure: LAPAROSCOPIC CHOLECYSTECTOMY WITH INTRAOPERATIVE CHOLANGIOGRAM;  Surgeon: Gwenyth Ober, MD;  Location: [place];  Service: General;  Laterality: N/A;  . COLONOSCOPY N/A 01/09/2013   Procedure: COLONOSCOPY;  Surgeon: Beryle Beams, MD;  Location: [place];  Service: Endoscopy;   Laterality: N/A;  . COLONOSCOPY  03/2018  . DIRECT LARYNGOSCOPY  07/2016   Dr. Nicolette Bang [facility]  . ESOPHAGOGASTRODUODENOSCOPY  03/2018  . ESOPHAGOGASTRODUODENOSCOPY (EGD) WITH PROPOFOL N/A 04/24/2019   Procedure: ESOPHAGOGASTRODUODENOSCOPY (EGD) WITH PROPOFOL;  Surgeon: Gatha Mayer, MD;  Location: [facility];  Service: Endoscopy;  Laterality: N/A;  . GASTROSTOMY TUBE PLACEMENT  09/27/2016  . GIVENS CAPSULE STUDY N/A 04/24/2019   Procedure: GIVENS CAPSULE STUDY;  Surgeon: Gatha Mayer, MD;  Location: [facility];  Service: Endoscopy;  Laterality: N/A;  . HERNIA REPAIR Left 1981  . IR PATIENT EVAL TECH 0-60 MINS  12/13/2016  . IR PATIENT EVAL TECH 0-60 MINS  04/11/2017  . IR PATIENT EVAL TECH 0-60 MINS  03/24/2018  . IR PATIENT EVAL TECH 0-60 MINS  11/23/2018  . IR PATIENT EVAL TECH 0-60 MINS  05/28/2019  . IR REPLACE G-TUBE SIMPLE WO FLUORO  12/12/2017  . IR REPLACE G-TUBE SIMPLE WO FLUORO  03/29/2018  . IR REPLACE G-TUBE SIMPLE WO FLUORO  06/29/2018  . IR REPLACE G-TUBE SIMPLE WO FLUORO  11/02/2018  . IR REPLACE G-TUBE SIMPLE WO FLUORO  03/08/2019  . MODIFIED RADICAL NECK DISSECTION Left 09/27/2016   Levels 1 &  2  . PARTIAL GLOSSECTOMY Left 09/27/2016   Left hemi partial glossectomy  . SPINE SURGERY  01/20/2016   fusion  . TONSILLECTOMY    . tracheotomy  09/27/2016  . TUBAL LIGATION  06/1988    There were no vitals filed for this visit.  Subjective Assessment - 06/15/19 1432    Subjective  Shavonte notes she is standing straighter.  Family members have noticed her posture improving as well.    Pertinent History  Multiple spine surgeries including fusion from L3 to S1.    Limitations  Sitting;House hold activities;Reading;Lifting;Standing;Walking;Writing    How long can you sit comfortably?  10-15 minutes (was 5)    How long can you stand comfortably?  20-30 minutes still    How long can you walk comfortably?  30-45 minutes (was 30 minutes)    Patient Stated Goals  Be more active,  walk better, sit and stand for longer periods of time.    Currently in Pain?  Yes    Pain Score  4     Pain Location  Back    Pain Orientation  Lower    Pain Descriptors / Indicators  Sore    Pain Type  Chronic pain    Pain Radiating Towards  Sciatica to calf with prolonged sitting.    Pain Onset  More than a month ago    Pain Frequency  Intermittent    Aggravating Factors   .    Pain Relieving Factors  Frequent change of position.    Effect of Pain on Daily Activities  Better with washing dishes.  Still takes longer than she would like to do household chores due to strength/endurance deficits.                   Outpatient [facility]  Lymphedema Life Impact Scale Total Score  38.24 %           [facility] Adult PT Treatment/Exercise - 06/15/19 0001      Therapeutic Activites    Therapeutic Activities  ADL's    Lifting  Body mechanics for dishes and sitting      Exercises   Exercises  Lumbar;Knee/Hip      Lumbar Exercises: Stretches   Active Hamstring Stretch  5 reps;20 seconds    Single Knee to Chest Stretch  5 reps;20 seconds      Lumbar Exercises: Machines for Strengthening   Leg Press  3 sets of 10 100#      Lumbar Exercises: Standing   Heel Raises  10 reps;3 seconds    Other Standing Lumbar Exercises  --   Shoulder blade pinches 10X 5 seconds   Other Standing Lumbar Exercises  --   Standing lumbar extension AROM (hips forward) 10X 3 seconds     Lumbar Exercises: Seated   Sit to Stand Limitations  2 sets of 5 slow eccentrics      Lumbar Exercises: Quadruped   Straight Leg Raise  --   6 reps, hold for now-too difficult     Knee/Hip Exercises: Standing   Other Standing Knee Exercises  Hip hiking 2 sets of 10 3 seconds alternating             PT Education - 06/15/19 1619    Education Details  Reviewed HEP and added new hip hike exercise for hip abductors strength.    Person(s) Educated   Patient    Methods  Explanation;Demonstration;Verbal cues;Handout    Comprehension  Returned demonstration;Verbalized understanding;Verbal cues required;Need further instruction       PT Short Term Goals - 05/16/19 1647      PT SHORT TERM GOAL #1   Title  Bellarose will be independent with her prescribed HEP.    Time  4    Period  Weeks    Status  New    Target Date  06/13/19        PT Long Term Goals - 05/16/19 1648      PT LONG TERM GOAL #1   Title  Kylan will be able to sit comfortably for 30+ minutes including being able to drive to and from [place] (can currently only drive 1 way).    Time  8    Period  Weeks    Status  New    Target Date  07/11/19      PT LONG TERM GOAL #2   Title  Guenevere will report back pain consistently 0-4/10 on the Numeric Pain Rating Scale.    Time  8    Period  Weeks    Status  New    Target Date  07/11/19      PT LONG TERM GOAL #3   Title  [place] will improve standing posture to neutral extension (currently 20 degrees bent) to allow improved standing and walking to an hour or better (currently 30 minutes or less).    Time  8    Period  Weeks    Status  New    Target Date  07/11/19      PT LONG TERM GOAL #4   Title  Andrian will improve LE flexibility for hip flexors (100); hamstrings (45) and hip rotation (IR 5/ER 40) to allow improved independence with ADLs and better uninterrupted sleep.    Time  8    Period  Weeks    Status  New    Target Date  07/11/19            Plan - 06/15/19 1620    Clinical Impression Statement  Parrie notes better standing posture and she is able to stand straighter.  Sitting and walking endurance have improved.  Static postures (sitting and standing) will benefit from continued strength work.  Emphasis remains core and leg strength along with postural awareness.  Spent extra time on the leg press due to weakness with sit to stand functional transfers.    Personal Factors and  Comorbidities  Fitness;Comorbidity 2    Comorbidities  Cancer survivor, hernia, deconditioning.    Examination-Activity Limitations  Squat;Hygiene/Grooming;Bed Mobility;Lift;Stairs;Bend;Locomotion Level;Stand;Bathing;Toileting;Carry;Transfers;Sit;Dressing;Sleep    Examination-Participation Restrictions  Laundry;Shop;Cleaning;Meal Prep;Driving    Stability/Clinical Decision Making  Evolving/Moderate complexity    Rehab Potential  Good    PT Frequency  2x / week    PT Duration  8 weeks    PT Treatment/Interventions  ADLs/Self Care Home Management;Cryotherapy;Moist Heat;Functional mobility training;Neuromuscular re-education;Therapeutic exercise;Therapeutic activities;Patient/family education;Manual techniques;Passive range of motion    PT Next Visit Plan  General conditioning and postural/body mechanics education.  Particular attention on tight hips and weak core.    PT Home Exercise Plan  Access Code: [email or web address]    Consulted and Agree with Plan of Care  Patient       Patient will benefit from skilled therapeutic intervention in order to improve the following deficits and impairments:  Abnormal gait, Improper body mechanics, Pain, Postural dysfunction, Increased muscle spasms, Decreased activity tolerance, Decreased endurance, Decreased range of motion, Decreased strength, Impaired flexibility, Difficulty walking  Visit Diagnosis:  Muscle weakness (generalized)  Chronic bilateral low back pain with right-sided sciatica  Difficulty in walking, not elsewhere classified  Abnormal posture     Problem List Patient Active Problem List   Diagnosis Date Noted  . Peripheral edema 03/12/2019  . Iron deficiency anemia 12/11/2018  . Loosening of hardware in spine ([place])   . Other spondylosis with radiculopathy, lumbar region   . History of lumbar spinal fusion 11/06/2018  . UTI (urinary tract infection) 05/15/2017  . 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 Malignant  neoplasm of base of tongue ([place]) 10/29/2016  . 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[place]) 10/29/2016  . Headache associated with sexual activity 05/14/2016  . Tongue lesion 05/14/2016  . Thrombocytopenia ([place]) 01/22/2016  . Chronic diarrhea 01/22/2016  . Chest pain   . Essential hypertension   . Spinal stenosis of lumbar region 01/20/2016    Class: Chronic  . DDD (degenerative disc disease), lumbar 01/20/2016    Class: Chronic  . Spinal stenosis of lumbar region with neurogenic claudication 01/20/2016  . Low back pain 12/22/2015  . Hypersomnolence 10/13/2015  . Muscle cramp 08/01/2015  . GERD (gastroesophageal reflux disease) 07/09/2015  . Excessive daytime sleepiness 06/18/2015  . Morbid obesity with BMI of 40.0-44.9, adult [facility]) 01/08/2013    Farley Ly PT, MPT 06/15/2019, 4:24 PM  [facility] Physical Therapy [address] [place], [place], [address] Phone: [phone]   Fax:  [phone]  Name: JAMELL OPFER MRN: [id number] Date of Birth: [birth year]

## 2019-06-16 ENCOUNTER — Other Ambulatory Visit: Payer: Self-pay | Admitting: Family Medicine

## 2019-06-16 DIAGNOSIS — K219 Gastro-esophageal reflux disease without esophagitis: Secondary | ICD-10-CM

## 2019-06-16 DIAGNOSIS — K449 Diaphragmatic hernia without obstruction or gangrene: Secondary | ICD-10-CM

## 2019-06-20 ENCOUNTER — Encounter: Payer: Self-pay | Admitting: Family Medicine

## 2019-06-21 ENCOUNTER — Encounter: Payer: Self-pay | Admitting: Rehabilitative and Restorative Service Providers"

## 2019-06-21 ENCOUNTER — Ambulatory Visit (INDEPENDENT_AMBULATORY_CARE_PROVIDER_SITE_OTHER): Payer: BC Managed Care – PPO | Admitting: Rehabilitative and Restorative Service Providers"

## 2019-06-21 ENCOUNTER — Other Ambulatory Visit: Payer: Self-pay

## 2019-06-21 DIAGNOSIS — M6281 Muscle weakness (generalized): Secondary | ICD-10-CM | POA: Diagnosis not present

## 2019-06-21 DIAGNOSIS — M5441 Lumbago with sciatica, right side: Secondary | ICD-10-CM | POA: Diagnosis not present

## 2019-06-21 DIAGNOSIS — R293 Abnormal posture: Secondary | ICD-10-CM

## 2019-06-21 DIAGNOSIS — R262 Difficulty in walking, not elsewhere classified: Secondary | ICD-10-CM | POA: Diagnosis not present

## 2019-06-21 DIAGNOSIS — G8929 Other chronic pain: Secondary | ICD-10-CM

## 2019-06-21 NOTE — Patient Instructions (Signed)
Access Code: [email or web address] URL: [email or web address] Date: 06/21/2019 Prepared by: Vista Mink  Exercises Supine Gluteus Stretch - 2-3 x daily - 7 x weekly - 1 sets - 5 reps - 20 seconds hold Supine Figure 4 Piriformis Stretch - 2-3 x daily - 7 x weekly - 1 sets - 5 reps - 20 seconds hold

## 2019-06-21 NOTE — Therapy (Signed)
[facility] Physical Therapy [address] [place], [place], [address] Phone: [phone]   Fax:  [phone]  Physical Therapy Treatment  Patient Details  Name: NYHLA MOUNTJOY MRN: [id number] Date of Birth: Jan 10, 1966 Referring Provider (PT): Jessy Oto   Encounter Date: 06/21/2019  PT End of Session - 06/21/19 1529    Visit Number  7    Number of Visits  16    Date for PT Re-Evaluation  07/11/19    PT Start Time  8588    PT Stop Time  1525    PT Time Calculation (min)  53 min    Activity Tolerance  No increased pain;Patient tolerated treatment well    Behavior During Therapy  [facility] for tasks assessed/performed       Past Medical History:  Diagnosis Date  . Allergy   . Anemia    Iron deficiency  . Anxiety   . Arthritis    back, left shoulder  . Blood transfusion without reported diagnosis   . Chicken pox   . Depression   . Diarrhea    takes Imodium daily  . GERD (gastroesophageal reflux disease)   . H/O hiatal hernia   . Headache(784.0)   . History of kidney stones    1996ish  . History of radiation therapy 11/16/16- 01/01/17   Base of Tongue/ 66 gy in 33 fractions/ Dose: 2 Gy  . Hypertension   . Iron deficiency anemia 12/11/2018  . Migraine    none for 5 years (as of 01/13/16)  . OSA (obstructive sleep apnea) 10/13/2015   unable to get cpap, plans to get one in 2018  . Pneumonia   . Restless legs   . Scoliosis   . Shingles 08/28/2013  . Shortness of breath    with exertion  . Sleep apnea   . Tongue cancer ([place])    tongue cancer    Past Surgical History:  Procedure Laterality Date  . BACK SURGERY  07/21/1978  . CHOLECYSTECTOMY N/A 08/15/2013   Procedure: LAPAROSCOPIC CHOLECYSTECTOMY WITH INTRAOPERATIVE CHOLANGIOGRAM;  Surgeon: Gwenyth Ober, MD;  Location: [place];  Service: General;  Laterality: N/A;  . COLONOSCOPY N/A 01/09/2013   Procedure: COLONOSCOPY;  Surgeon: Beryle Beams, MD;  Location: [place];  Service: Endoscopy;   Laterality: N/A;  . COLONOSCOPY  03/2018  . DIRECT LARYNGOSCOPY  07/2016   Dr. Nicolette Bang [facility]  . ESOPHAGOGASTRODUODENOSCOPY  03/2018  . ESOPHAGOGASTRODUODENOSCOPY (EGD) WITH PROPOFOL N/A 04/24/2019   Procedure: ESOPHAGOGASTRODUODENOSCOPY (EGD) WITH PROPOFOL;  Surgeon: Gatha Mayer, MD;  [facility];  Service: Endoscopy;  Laterality: N/A;  . GASTROSTOMY TUBE PLACEMENT  09/27/2016  . GIVENS CAPSULE STUDY N/A 04/24/2019   Procedure: GIVENS CAPSULE STUDY;  Surgeon: Gatha Mayer, MD;  [facility];  Service: Endoscopy;  Laterality: N/A;  . HERNIA REPAIR Left 1981  . IR PATIENT EVAL TECH 0-60 MINS  12/13/2016  . IR PATIENT EVAL TECH 0-60 MINS  04/11/2017  . IR PATIENT EVAL TECH 0-60 MINS  03/24/2018  . IR PATIENT EVAL TECH 0-60 MINS  11/23/2018  . IR PATIENT EVAL TECH 0-60 MINS  05/28/2019  . IR REPLACE G-TUBE SIMPLE WO FLUORO  12/12/2017  . IR REPLACE G-TUBE SIMPLE WO FLUORO  03/29/2018  . IR REPLACE G-TUBE SIMPLE WO FLUORO  06/29/2018  . IR REPLACE G-TUBE SIMPLE WO FLUORO  11/02/2018  . IR REPLACE G-TUBE SIMPLE WO FLUORO  03/08/2019  . MODIFIED RADICAL NECK DISSECTION Left 09/27/2016   Levels 1 &  2  . PARTIAL GLOSSECTOMY Left 09/27/2016   Left hemi partial glossectomy  . SPINE SURGERY  01/20/2016   fusion  . TONSILLECTOMY    . tracheotomy  09/27/2016  . TUBAL LIGATION  06/1988    There were no vitals filed for this visit.  Subjective Assessment - 06/21/19 1434    Subjective  R side (hip) is hurting more today.  Weather?    Pertinent History  Multiple spine surgeries including fusion from L3 to S1.    Limitations  Sitting;House hold activities;Reading;Lifting;Standing;Walking;Writing    How long can you sit comfortably?  10-15 minutes (was 5)    How long can you stand comfortably?  20-30 minutes still    How long can you walk comfortably?  30-45 minutes (was 30 minutes)    Patient Stated Goals  Be more active, walk better, sit and stand for longer periods of time.     Currently in Pain?  Yes    Pain Score  3     Pain Location  Hip    Pain Orientation  Right    Pain Descriptors / Indicators  Aching    Pain Type  Chronic pain    Pain Onset  More than a month ago    Pain Frequency  Intermittent    Aggravating Factors   Prolonged postures    Pain Relieving Factors  Brief changes of position, therapeutic exercises and ice    Effect of Pain on Daily Activities  Slows participation with ADLs.                   [facility]  Lymphedema Life Impact Scale Total Score  38.24 %           [facility] Adult PT Treatment/Exercise - 06/21/19 0001      Therapeutic Activites    Therapeutic Activities  ADL's    Lifting  Body mechanics for dishes and sitting      Exercises   Exercises  Lumbar;Knee/Hip      Lumbar Exercises: Stretches   Active Hamstring Stretch  5 reps;20 seconds    Single Knee to Chest Stretch  5 reps;20 seconds    Piriformis Stretch  5 reps;20 seconds;Right;Left    Other Lumbar Stretch Exercise  Gluteal stretch 5X 20 seconds each      Lumbar Exercises: Machines for Strengthening   Leg Press  3 sets of 10 100#      Lumbar Exercises: Standing   Heel Raises  10 reps;3 seconds    Other Standing Lumbar Exercises  --   Shoulder blade pinches 10X 5 seconds   Other Standing Lumbar Exercises  --   Standing lumbar extension AROM (hips forward) 10X 3 seconds     Lumbar Exercises: Seated   Sit to Stand Limitations  2 sets of 5 slow eccentrics      Knee/Hip Exercises: Standing   Other Standing Knee Exercises  Hip hiking 2 sets of 10 3 seconds alternating             PT Education - 06/21/19 1527    Education Details  Reviewed HEP, body mechanics and added 2 new exercises to address hip pain complaints.    Person(s) Educated  Patient    Methods  Explanation;Demonstration;Verbal cues;Handout    Comprehension  Verbal cues required;Returned demonstration;Need  further instruction;Verbalized understanding       PT Short Term Goals - 06/21/19 1528      PT SHORT  TERM GOAL #1   Title  Casy will be independent with her prescribed HEP.    Time  4    Period  Weeks    Status  Achieved    Target Date  06/13/19        PT Long Term Goals - 05/16/19 1648      PT LONG TERM GOAL #1   Title  Hazel will be able to sit comfortably for 30+ minutes including being able to drive to and from [place] (can currently only drive 1 way).    Time  8    Period  Weeks    Status  New    Target Date  07/11/19      PT LONG TERM GOAL #2   Title  Myeesha will report back pain consistently 0-4/10 on the Numeric Pain Rating Scale.    Time  8    Period  Weeks    Status  New    Target Date  07/11/19      PT LONG TERM GOAL #3   Title  Latitia will improve standing posture to neutral extension (currently 20 degrees bent) to allow improved standing and walking to an hour or better (currently 30 minutes or less).    Time  8    Period  Weeks    Status  New    Target Date  07/11/19      PT LONG TERM GOAL #4   Title  Camala will improve LE flexibility for hip flexors (100); hamstrings (45) and hip rotation (IR 5/ER 40) to allow improved independence with ADLs and better uninterrupted sleep.    Time  8    Period  Weeks    Status  New    Target Date  07/11/19            Plan - 06/21/19 1529    Clinical Impression Statement  Ellizabeth continues to improve with her standing and walking posture.  Increased hip pain was diminished with new hip stretches and added to her HEP.  We will continue core strength and general conditioning work to meet long-term goals established at evaluation.    Personal Factors and Comorbidities  Fitness;Comorbidity 2    Comorbidities  Cancer survivor, hernia, deconditioning.    Examination-Activity Limitations  Squat;Hygiene/Grooming;Bed Mobility;Lift;Stairs;Bend;Locomotion  Level;Stand;Bathing;Toileting;Carry;Transfers;Sit;Dressing;Sleep    Examination-Participation Restrictions  Laundry;Shop;Cleaning;Meal Prep;Driving    Stability/Clinical Decision Making  Evolving/Moderate complexity    Rehab Potential  Good    PT Frequency  2x / week    PT Duration  8 weeks    PT Treatment/Interventions  ADLs/Self Care Home Management;Cryotherapy;Moist Heat;Functional mobility training;Neuromuscular re-education;Therapeutic exercise;Therapeutic activities;Patient/family education;Manual techniques;Passive range of motion    PT Next Visit Plan  General conditioning and postural/body mechanics education.  Particular attention on tight hips and weak core.    PT Home Exercise Plan  Access Code: [email or web address], Access Code: [email or web address]    Consulted and Agree with Plan of Care  Patient       Patient will benefit from skilled therapeutic intervention in order to improve the following deficits and impairments:  Abnormal gait, Improper body mechanics, Pain, Postural dysfunction, Increased muscle spasms, Decreased activity tolerance, Decreased endurance, Decreased range of motion, Decreased strength, Impaired flexibility, Difficulty walking  Visit Diagnosis: Muscle weakness (generalized)  Chronic bilateral low back pain with right-sided sciatica  Difficulty in walking, not elsewhere classified  Abnormal posture     Problem List Patient Active Problem List   Diagnosis Date Noted  . Peripheral edema  03/12/2019  . Iron deficiency anemia 12/11/2018  . Loosening of hardware in spine ([place])   . Other spondylosis with radiculopathy, lumbar region   . History of lumbar spinal fusion 11/06/2018  . UTI (urinary tract infection) 05/15/2017  . 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 Malignant neoplasm of base of tongue ([place]) 10/29/2016  . 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[place]) 10/29/2016  . Headache associated with sexual activity 05/14/2016  . Tongue lesion  05/14/2016  . Thrombocytopenia ([place]) 01/22/2016  . Chronic diarrhea 01/22/2016  . Chest pain   . Essential hypertension   . Spinal stenosis of lumbar region 01/20/2016    Class: Chronic  . DDD (degenerative disc disease), lumbar 01/20/2016    Class: Chronic  . Spinal stenosis of lumbar region with neurogenic claudication 01/20/2016  . Low back pain 12/22/2015  . Hypersomnolence 10/13/2015  . Muscle cramp 08/01/2015  . GERD (gastroesophageal reflux disease) 07/09/2015  . Excessive daytime sleepiness 06/18/2015  . Morbid obesity with BMI of 40.0-44.9, adult [facility]) 01/08/2013    Farley Ly PT, MPT 06/21/2019, 3:44 PM  [facility] Physical Therapy [address] [place], [place], [address] Phone: [phone]   Fax:  [phone]  Name: ANASTASSIA NOACK MRN: [id number] Date of Birth: [birth year]

## 2019-06-25 ENCOUNTER — Other Ambulatory Visit: Payer: Self-pay | Admitting: Internal Medicine

## 2019-06-25 ENCOUNTER — Encounter: Payer: Self-pay | Admitting: Rehabilitative and Restorative Service Providers"

## 2019-06-25 ENCOUNTER — Other Ambulatory Visit: Payer: Self-pay

## 2019-06-25 ENCOUNTER — Ambulatory Visit (INDEPENDENT_AMBULATORY_CARE_PROVIDER_SITE_OTHER): Payer: BC Managed Care – PPO | Admitting: Rehabilitative and Restorative Service Providers"

## 2019-06-25 DIAGNOSIS — M5441 Lumbago with sciatica, right side: Secondary | ICD-10-CM | POA: Diagnosis not present

## 2019-06-25 DIAGNOSIS — R293 Abnormal posture: Secondary | ICD-10-CM | POA: Diagnosis not present

## 2019-06-25 DIAGNOSIS — R262 Difficulty in walking, not elsewhere classified: Secondary | ICD-10-CM

## 2019-06-25 DIAGNOSIS — G8929 Other chronic pain: Secondary | ICD-10-CM

## 2019-06-25 DIAGNOSIS — M6281 Muscle weakness (generalized): Secondary | ICD-10-CM

## 2019-06-25 MED ORDER — FERROUS SULFATE 300 (60 FE) MG/5ML PO SYRP: 300.0000 mg | ORAL_SOLUTION | Freq: Every day | ORAL | 3 refills | Status: DC

## 2019-06-25 NOTE — Therapy (Addendum)
[facility] Physical Therapy [address] [place], [place], [address] Phone: [phone]   Fax:  [phone]  Physical Therapy Treatment  Patient Details  Name: Kristen [name] MRN: [id number] Date of Birth: 1965/04/28 Referring Provider (PT): Jessy Oto   Encounter Date: 06/25/2019  PT End of Session - 06/25/19 1634    Visit Number  7    Number of Visits  12    Date for PT Re-Evaluation  07/20/19    PT Start Time  1497    PT Stop Time  0263    PT Time Calculation (min)  43 min    Activity Tolerance  Patient tolerated treatment well;No increased pain    Behavior During Therapy  WFL for tasks assessed/performed       Past Medical History:  Diagnosis Date  . Allergy   . Anemia    Iron deficiency  . Anxiety   . Arthritis    back, left shoulder  . Blood transfusion without reported diagnosis   . Chicken pox   . Depression   . Diarrhea    takes Imodium daily  . GERD (gastroesophageal reflux disease)   . H/O hiatal hernia   . Headache(784.0)   . History of kidney stones    1996ish  . History of radiation therapy 11/16/16- 01/01/17   Base of Tongue/ 66 gy in 33 fractions/ Dose: 2 Gy  . Hypertension   . Iron deficiency anemia 12/11/2018  . Migraine    none for 5 years (as of 01/13/16)  . OSA (obstructive sleep apnea) 10/13/2015   unable to get cpap, plans to get one in 2018  . Pneumonia   . Restless legs   . Scoliosis   . Shingles 08/28/2013  . Shortness of breath    with exertion  . Sleep apnea   . Tongue cancer ([place])    tongue cancer    Past Surgical History:  Procedure Laterality Date  . BACK SURGERY  07/21/1978  . CHOLECYSTECTOMY N/A 08/15/2013   Procedure: LAPAROSCOPIC CHOLECYSTECTOMY WITH INTRAOPERATIVE CHOLANGIOGRAM;  Surgeon: Gwenyth Ober, MD;  Location: [place];  Service: General;  Laterality: N/A;  . COLONOSCOPY N/A 01/09/2013   Procedure: COLONOSCOPY;  Surgeon: Beryle Beams, MD;  Location: [place];  Service: Endoscopy;   Laterality: N/A;  . COLONOSCOPY  03/2018  . DIRECT LARYNGOSCOPY  07/2016   Dr. Nicolette Bang [facility]  . ESOPHAGOGASTRODUODENOSCOPY  03/2018  . ESOPHAGOGASTRODUODENOSCOPY (EGD) WITH PROPOFOL N/A 04/24/2019   Procedure: ESOPHAGOGASTRODUODENOSCOPY (EGD) WITH PROPOFOL;  Surgeon: Gatha Mayer, MD;  [facility];  Service: Endoscopy;  Laterality: N/A;  . GASTROSTOMY TUBE PLACEMENT  09/27/2016  . GIVENS CAPSULE STUDY N/A 04/24/2019   Procedure: GIVENS CAPSULE STUDY;  Surgeon: Gatha Mayer, MD;  [facility];  Service: Endoscopy;  Laterality: N/A;  . HERNIA REPAIR Left 1981  . IR PATIENT EVAL TECH 0-60 MINS  12/13/2016  . IR PATIENT EVAL TECH 0-60 MINS  04/11/2017  . IR PATIENT EVAL TECH 0-60 MINS  03/24/2018  . IR PATIENT EVAL TECH 0-60 MINS  11/23/2018  . IR PATIENT EVAL TECH 0-60 MINS  05/28/2019  . IR REPLACE G-TUBE SIMPLE WO FLUORO  12/12/2017  . IR REPLACE G-TUBE SIMPLE WO FLUORO  03/29/2018  . IR REPLACE G-TUBE SIMPLE WO FLUORO  06/29/2018  . IR REPLACE G-TUBE SIMPLE WO FLUORO  11/02/2018  . IR REPLACE G-TUBE SIMPLE WO FLUORO  03/08/2019  . MODIFIED RADICAL NECK DISSECTION Left 09/27/2016   Levels 1 &  2  . PARTIAL GLOSSECTOMY Left 09/27/2016   Left hemi partial glossectomy  . SPINE SURGERY  01/20/2016   fusion  . TONSILLECTOMY    . tracheotomy  09/27/2016  . TUBAL LIGATION  06/1988    There were no vitals filed for this visit.  Subjective Assessment - 06/25/19 1430    Subjective  Kristen [name] notes soreness but no pain since her last PT visit.    Pertinent History  Multiple spine surgeries including fusion from L3 to S1.    Limitations  Sitting;House hold activities;Reading;Lifting;Standing;Walking;Writing    How long can you sit comfortably?  10-15 minutes (was 5)    How long can you stand comfortably?  20-30 minutes still    How long can you walk comfortably?  30-45 minutes (was 30 minutes)    Patient Stated Goals  Be more active, walk better, sit and stand for longer  periods of time.    Currently in Pain?  No/denies    Pain Onset  More than a month ago    Aggravating Factors   Primarily prolonged sitting/driving.    Pain Relieving Factors  Exercise and brief periods of rest.    Effect of Pain on Daily Activities  Limited driving due to discomfort after sitting too long.                   [facility]  Lymphedema Life Impact Scale Total Score  38.24 %           [facility] Adult PT Treatment/Exercise - 06/25/19 0001      Therapeutic Activites    Therapeutic Activities  ADL's    Lifting  Body mechanics for dishes and sitting      Exercises   Exercises  Lumbar;Knee/Hip      Lumbar Exercises: Stretches   Active Hamstring Stretch  5 reps;20 seconds    Single Knee to Chest Stretch  5 reps;20 seconds    Piriformis Stretch  5 reps;20 seconds;Right;Left    Other Lumbar Stretch Exercise  Gluteal stretch 5X 20 seconds each      Lumbar Exercises: Machines for Strengthening   Leg Press  3 sets of 10 125#      Lumbar Exercises: Standing   Heel Raises  10 reps;3 seconds    Other Standing Lumbar Exercises  --   Shoulder blade pinches 10X 5 seconds   Other Standing Lumbar Exercises  --   Standing lumbar extension AROM (hips forward) 10X 3 seconds     Lumbar Exercises: Seated   Sit to Stand Limitations  2 sets of 5 slow eccentrics      Knee/Hip Exercises: Standing   Other Standing Knee Exercises  Hip hiking 2 sets of 10 3 seconds alternating             PT Education - 06/25/19 1633    Education Details  Corrected newest additions to Kristen [name]'s HEP.    Person(s) Educated  Patient    Methods  Explanation;Tactile cues;Verbal cues    Comprehension  Verbalized understanding;Returned demonstration;Verbal cues required;Tactile cues required;Need further instruction       PT Short Term Goals - 06/21/19 1528      PT SHORT TERM GOAL #1   Title  Aviyanna will be  independent with her prescribed HEP.    Time  4    Period  Weeks    Status  Achieved    Target Date  06/13/19  PT Long Term Goals - 05/16/19 1648      PT LONG TERM GOAL #1   Title  Kristen [name] will be able to sit comfortably for 30+ minutes including being able to drive to and from [facility] (can currently only drive 1 way).    Time  8    Period  Weeks    Status  New    Target Date  07/11/19      PT LONG TERM GOAL #2   Title  Kristen [name] will report back pain consistently 0-4/10 on the Numeric Pain Rating Scale.    Time  8    Period  Weeks    Status  New    Target Date  07/11/19      PT LONG TERM GOAL #3   Title  Kristen [name] will improve standing posture to neutral extension (currently 20 degrees bent) to allow improved standing and walking to an hour or better (currently 30 minutes or less).    Time  8    Period  Weeks    Status  New    Target Date  07/11/19      PT LONG TERM GOAL #4   Title  Kristen [name] will improve LE flexibility for hip flexors (100); hamstrings (45) and hip rotation (IR 5/ER 40) to allow improved independence with ADLs and better uninterrupted sleep.    Time  8    Period  Weeks    Status  New    Target Date  07/11/19            Plan - 06/25/19 1515    Clinical Impression Statement  Kristen [name] reports no pain since her last PT visit.  She is very sore after PT and still has significant stiffness with supine stretches and prolonged postures.  Continue current HEP along with strength mechanics as appropriate.    Personal Factors and Comorbidities  Fitness;Comorbidity 2    Comorbidities  Cancer survivor, hernia, deconditioning.    Examination-Activity Limitations  Squat;Hygiene/Grooming;Bed Mobility;Lift;Stairs;Bend;Locomotion Level;Stand;Bathing;Toileting;Carry;Transfers;Sit;Dressing;Sleep    Examination-Participation Restrictions  Laundry;Shop;Cleaning;Meal Prep;Driving    Stability/Clinical Decision Making  Evolving/Moderate complexity    Rehab  Potential  Good    PT Frequency  2x / week    PT Duration  8 weeks    PT Treatment/Interventions  ADLs/Self Care Home Management;Cryotherapy;Moist Heat;Functional mobility training;Neuromuscular re-education;Therapeutic exercise;Therapeutic activities;Patient/family education;Manual techniques;Passive range of motion    PT Next Visit Plan  General conditioning and postural/body mechanics education.  Particular attention on tight hips and weak core.    PT Home Exercise Plan  Access Code: [email or web address], Access Code: [email or web address]    Consulted and Agree with Plan of Care  Patient       Patient will benefit from skilled therapeutic intervention in order to improve the following deficits and impairments:  Abnormal gait, Improper body mechanics, Pain, Postural dysfunction, Increased muscle spasms, Decreased activity tolerance, Decreased endurance, Decreased range of motion, Decreased strength, Impaired flexibility, Difficulty walking  Visit Diagnosis: Muscle weakness (generalized)  Chronic bilateral low back pain with right-sided sciatica  Difficulty in walking, not elsewhere classified  Abnormal posture     Problem List Patient Active Problem List   Diagnosis Date Noted  . Peripheral edema 03/12/2019  . Iron deficiency anemia 12/11/2018  . Loosening of hardware in spine ([place])   . Other spondylosis with radiculopathy, lumbar region   . History of lumbar spinal fusion 11/06/2018  . UTI (urinary tract infection) 05/15/2017  . 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  Malignant neoplasm of base of tongue ([place]) 10/29/2016  . 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[place]) 10/29/2016  . Headache associated with sexual activity 05/14/2016  . Tongue lesion 05/14/2016  . Thrombocytopenia ([place]) 01/22/2016  . Chronic diarrhea 01/22/2016  . Chest pain   . Essential hypertension   . Spinal stenosis of lumbar region 01/20/2016    Class: Chronic  . DDD (degenerative disc disease),  lumbar 01/20/2016    Class: Chronic  . Spinal stenosis of lumbar region with neurogenic claudication 01/20/2016  . Low back pain 12/22/2015  . Hypersomnolence 10/13/2015  . Muscle cramp 08/01/2015  . GERD (gastroesophageal reflux disease) 07/09/2015  . Excessive daytime sleepiness 06/18/2015  . Morbid obesity with BMI of 40.0-44.9, adult ([place]) 01/08/2013    Farley Ly PT, MPT 06/25/2019, 4:38 PM   PHYSICAL THERAPY DISCHARGE SUMMARY  Visits from Start of Care: 7  Current functional level related to goals / functional outcomes: Improved   Remaining deficits: See note   Education / Equipment: HEP Plan: Patient agrees to discharge.  Patient goals were partially met. Patient is being discharged due to not returning since the last visit.  ?????     Farley Ly PT, MPT  [facility] Physical Therapy [address] [place], [place], [address] Phone: [phone]   Fax:  [phone]  Name: Kristen [name] MRN: [id number] Date of Birth: 07-17-1965

## 2019-06-27 ENCOUNTER — Encounter: Payer: BC Managed Care – PPO | Admitting: Rehabilitative and Restorative Service Providers"

## 2019-06-29 ENCOUNTER — Other Ambulatory Visit (HOSPITAL_COMMUNITY): Payer: Self-pay | Admitting: Radiology

## 2019-06-29 ENCOUNTER — Other Ambulatory Visit: Payer: Self-pay | Admitting: Radiology

## 2019-06-29 DIAGNOSIS — C01 Malignant neoplasm of base of tongue: Secondary | ICD-10-CM

## 2019-06-29 DIAGNOSIS — R633 Feeding difficulties, unspecified: Secondary | ICD-10-CM

## 2019-07-02 ENCOUNTER — Emergency Department (HOSPITAL_COMMUNITY)
Admission: EM | Admit: 2019-07-02 | Discharge: 2019-07-02 | Disposition: A | Discharge status: Home / Self Care | Payer: BC Managed Care – PPO | Attending: Emergency Medicine | Admitting: Emergency Medicine

## 2019-07-02 ENCOUNTER — Emergency Department (HOSPITAL_COMMUNITY): Payer: BC Managed Care – PPO

## 2019-07-02 ENCOUNTER — Ambulatory Visit (HOSPITAL_COMMUNITY)
Admission: RE | Admit: 2019-07-02 | Discharge: 2019-07-02 | Disposition: A | Discharge status: Home / Self Care | Payer: BC Managed Care – PPO | Source: Ambulatory Visit | Attending: Radiology | Admitting: Radiology

## 2019-07-02 ENCOUNTER — Other Ambulatory Visit: Payer: Self-pay | Admitting: Family Medicine

## 2019-07-02 ENCOUNTER — Other Ambulatory Visit: Payer: Self-pay

## 2019-07-02 DIAGNOSIS — R633 Feeding difficulties, unspecified: Secondary | ICD-10-CM

## 2019-07-02 DIAGNOSIS — Z79899 Other long term (current) drug therapy: Secondary | ICD-10-CM | POA: Insufficient documentation

## 2019-07-02 DIAGNOSIS — T85598A Other mechanical complication of other gastrointestinal prosthetic devices, implants and grafts, initial encounter: Secondary | ICD-10-CM | POA: Diagnosis not present

## 2019-07-02 DIAGNOSIS — I10 Essential (primary) hypertension: Secondary | ICD-10-CM | POA: Insufficient documentation

## 2019-07-02 DIAGNOSIS — Z431 Encounter for attention to gastrostomy: Secondary | ICD-10-CM | POA: Insufficient documentation

## 2019-07-02 DIAGNOSIS — Y69 Unspecified misadventure during surgical and medical care: Secondary | ICD-10-CM | POA: Insufficient documentation

## 2019-07-02 DIAGNOSIS — C029 Malignant neoplasm of tongue, unspecified: Secondary | ICD-10-CM | POA: Insufficient documentation

## 2019-07-02 DIAGNOSIS — K9423 Gastrostomy malfunction: Secondary | ICD-10-CM | POA: Diagnosis present

## 2019-07-02 HISTORY — PX: IR REPLACE G-TUBE SIMPLE WO FLUORO: IMG2323

## 2019-07-02 MED ORDER — LIDOCAINE VISCOUS HCL 2 % MT SOLN
OROMUCOSAL | Status: AC
  Filled 2019-07-02: qty 15

## 2019-07-02 MED ORDER — GABAPENTIN 300 MG PO CAPS: ORAL_CAPSULE | ORAL | 0 refills | Status: DC

## 2019-07-02 MED ORDER — IOHEXOL 300 MG/ML  SOLN
50.0000 mL | Freq: Once | INTRAMUSCULAR | Status: AC | PRN
  Administered 2019-07-02: 50 mL

## 2019-07-02 MED ORDER — SILVER NITRATE-POT NITRATE 75-25 % EX MISC
CUTANEOUS | Status: AC
  Filled 2019-07-02: qty 30

## 2019-07-02 NOTE — ED Provider Notes (Signed)
[facility] DEPT Provider Note   CSN: [id number] Arrival date & time: 07/02/19  1518     History Chief Complaint  Patient presents with  . g tube fell out    Kristen [name] is a 54 y.o. female.  Patient is a 54 year old female who has a past medical history of oral cancer resulting in G-tube placement for the last 3 years presenting to the emergency department for G-tube falling out.  Patient reports that today around 11 she had a routine change of her G-tube which went well.  Reports that about days later she was removing the gauze around her G-tube and that the tube just fell out.  It has been out for about the last 10 minutes or so.  Denies any other complications.        Past Medical History:  Diagnosis Date  . Allergy   . Anemia    Iron deficiency  . Anxiety   . Arthritis    back, left shoulder  . Blood transfusion without reported diagnosis   . Chicken pox   . Depression   . Diarrhea    takes Imodium daily  . GERD (gastroesophageal reflux disease)   . H/O hiatal hernia   . Headache(784.0)   . History of kidney stones    1996ish  . History of radiation therapy 11/16/16- 01/01/17   Base of Tongue/ 66 gy in 33 fractions/ Dose: 2 Gy  . Hypertension   . Iron deficiency anemia 12/11/2018  . Migraine    none for 5 years (as of 01/13/16)  . OSA (obstructive sleep apnea) 10/13/2015   unable to get cpap, plans to get one in 2018  . Pneumonia   . Restless legs   . Scoliosis   . Shingles 08/28/2013  . Shortness of breath    with exertion  . Sleep apnea   . Tongue cancer ([place])    tongue cancer    Patient Active Problem List   Diagnosis Date Noted  . Peripheral edema 03/12/2019  . Iron deficiency anemia 12/11/2018  . Loosening of hardware in spine ([place])   . Other spondylosis with radiculopathy, lumbar region   . History of lumbar spinal fusion 11/06/2018  . UTI (urinary tract infection) 05/15/2017  . 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 Malignant neoplasm of base of tongue ([place]) 10/29/2016  . 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[place]) 10/29/2016  . Headache associated with sexual activity 05/14/2016  . Tongue lesion 05/14/2016  . Thrombocytopenia ([place]) 01/22/2016  . Chronic diarrhea 01/22/2016  . Chest pain   . Essential hypertension   . Spinal stenosis of lumbar region 01/20/2016    Class: Chronic  . DDD (degenerative disc disease), lumbar 01/20/2016    Class: Chronic  . Spinal stenosis of lumbar region with neurogenic claudication 01/20/2016  . Low back pain 12/22/2015  . Hypersomnolence 10/13/2015  . Muscle cramp 08/01/2015  . GERD (gastroesophageal reflux disease) 07/09/2015  . Excessive daytime sleepiness 06/18/2015  . Morbid obesity with BMI of 40.0-44.9, adult ([place]) 01/08/2013    Past Surgical History:  Procedure Laterality Date  . BACK SURGERY  07/21/1978  . CHOLECYSTECTOMY N/A 08/15/2013   Procedure: LAPAROSCOPIC CHOLECYSTECTOMY WITH INTRAOPERATIVE CHOLANGIOGRAM;  Surgeon: Gwenyth Ober, MD;  Location: [facility];  Service: General;  Laterality: N/A;  . COLONOSCOPY N/A 01/09/2013   Procedure: COLONOSCOPY;  Surgeon: Beryle Beams, MD;  Location: [facility];  Service: Endoscopy;  Laterality: N/A;  .  COLONOSCOPY  03/2018  . DIRECT LARYNGOSCOPY  07/2016   Dr. Nicolette Bang [facility]  . ESOPHAGOGASTRODUODENOSCOPY  03/2018  . ESOPHAGOGASTRODUODENOSCOPY (EGD) WITH PROPOFOL N/A 04/24/2019   Procedure: ESOPHAGOGASTRODUODENOSCOPY (EGD) WITH PROPOFOL;  Surgeon: Gatha Mayer, MD;  Location: [facility];  Service: Endoscopy;  Laterality: N/A;  . GASTROSTOMY TUBE PLACEMENT  09/27/2016  . GIVENS CAPSULE STUDY N/A 04/24/2019   Procedure: GIVENS CAPSULE STUDY;  Surgeon: Gatha Mayer, MD;  Location: [facility];  Service: Endoscopy;  Laterality: N/A;  . HERNIA REPAIR Left 1981  . IR PATIENT EVAL TECH 0-60 MINS  12/13/2016  . IR PATIENT EVAL TECH 0-60 MINS  04/11/2017  . IR PATIENT EVAL TECH  0-60 MINS  03/24/2018  . IR PATIENT EVAL TECH 0-60 MINS  11/23/2018  . IR PATIENT EVAL TECH 0-60 MINS  05/28/2019  . IR REPLACE G-TUBE SIMPLE WO FLUORO  12/12/2017  . IR REPLACE G-TUBE SIMPLE WO FLUORO  03/29/2018  . IR REPLACE G-TUBE SIMPLE WO FLUORO  06/29/2018  . IR REPLACE G-TUBE SIMPLE WO FLUORO  11/02/2018  . IR REPLACE G-TUBE SIMPLE WO FLUORO  03/08/2019  . IR REPLACE G-TUBE SIMPLE WO FLUORO  07/02/2019  . MODIFIED RADICAL NECK DISSECTION Left 09/27/2016   Levels 1 & 2  . PARTIAL GLOSSECTOMY Left 09/27/2016   Left hemi partial glossectomy  . SPINE SURGERY  01/20/2016   fusion  . TONSILLECTOMY    . tracheotomy  09/27/2016  . TUBAL LIGATION  06/1988     OB History   No obstetric history on file.     Family History  Problem Relation Age of Onset  . Heart disease Father   . Heart attack Father   . Hypertension Mother   . Arthritis Mother   . Diabetes Maternal Grandmother   . Diabetes Paternal Grandmother   . Diabetes Maternal Uncle   . Colon polyps Neg Hx   . Colon cancer Neg Hx   . Esophageal cancer Neg Hx   . Stomach cancer Neg Hx   . Rectal cancer Neg Hx     Social History   Tobacco Use  . Smoking status: Never Smoker  . Smokeless tobacco: Never Used  Vaping Use  . Vaping Use: Never used  Substance Use Topics  . Alcohol use: Yes    Alcohol/week: 7.0 standard drinks    Types: 7 Cans of beer per week  . Drug use: No    Home Medications Prior to Admission medications   Medication Sig Start Date End Date Taking? Authorizing Provider  amLODipine (NORVASC) 10 MG tablet TAKE 1 TABLET BY MOUTH EVERY DAY Patient taking differently: Place 10 mg into feeding tube daily.  08/01/18   [name], MD  amphetamine-dextroamphetamine (ADDERALL) 10 MG tablet 1-3 daily as needed for sleepiness Patient taking differently: Place 10-30 mg into feeding tube daily as needed (sleepiness).  03/12/19   Deneise Lever, MD  aspirin 81 MG chewable tablet Place 81 mg into feeding  tube daily.  10/05/16   [provider]  baclofen (LIORESAL) 10 MG tablet PLACE 1 TABLET VIA FEEDING TUBE THREE TIMES DAILY 06/11/19   Jessy Oto, MD  Calcium Citrate 200 MG TABS 200 mg by PEG Tube route daily at 12 noon.    [provider]  diclofenac (VOLTAREN) 75 MG EC tablet TAKE 1 TABLET BY MOUTH TWICE DAILY WITH FOOD AS NEEDED FOR PAIN 06/01/19   Jessy Oto, MD  diclofenac Sodium (VOLTAREN) 1 % GEL Apply 2  g topically 4 (four) times daily. Patient taking differently: Apply 2 g topically 3 (three) times daily as needed (Pain).  12/07/18   Jessy Oto, MD  diphenoxylate-atropine (LOMOTIL) 2.5-0.025 MG tablet Place 2 tablets into feeding tube in the morning, at noon, and at bedtime. 03/07/19   Gatha Mayer, MD  ferrous sulfate 300 (60 Fe) MG/5ML syrup Place 5 mLs (300 mg total) into feeding tube daily. 06/25/19   Gatha Mayer, MD  folic acid (FOLVITE) 811 MCG tablet Place 400 mcg into feeding tube daily.    [provider]  gabapentin (NEURONTIN) 300 MG capsule TAKE 1 CAPSULE(300 MG) BY MOUTH THREE TIMES DAILY 07/02/19   [name], MD  hydrocortisone cream 1 % APPLY EXTERNALLY TO THE AFFECTED AREA TWICE DAILY Patient taking differently: Apply 1 application topically 2 (two) times daily as needed for itching.  06/16/18   [name], MD  magic mouthwash w/lidocaine SOLN Use 2 teaspoons swish and spit every 4-6 hours prn 05/09/19   [name], MD  Multiple Vitamin (MULTIVITAMIN WITH MINERALS) TABS tablet Place 1 tablet into feeding tube daily. Patient taking differently: Place 1 tablet into feeding tube daily. Centrum 11/10/18   Jessy Oto, MD  pantoprazole (PROTONIX) 40 MG tablet TAKE 1 TABLET BY MOUTH DAILY 06/19/19   [name], MD  POTASSIUM GLUCONATE PO 99 mEq by PEG Tube route at bedtime.  10/04/16   [provider]  SUMAtriptan (IMITREX) 100 MG tablet Take 0.5-1 tablet by mouth at the onset of migraine and may repeat in 2  hours if headache persists or recurs. Patient taking differently: Place 100 mg into feeding tube every 2 (two) hours as needed for migraine. Take 0.5-1 tablet by mouth at the onset of migraine and may repeat in 2 hours if headache persists or recurs. 11/09/18   Jessy Oto, MD  traMADol (ULTRAM) 50 MG tablet Take 1 tablet (50 mg total) by mouth every 6 (six) hours as needed. 05/07/19   Jessy Oto, MD    Allergies    Other and Sulfur  Review of Systems   Review of Systems  Constitutional: Negative for chills and fever.  Gastrointestinal: Negative for abdominal pain, nausea and vomiting.  All other systems reviewed and are negative.   Physical Exam Updated Vital Signs BP (!) 121/92 (BP Location: Right Arm)   Pulse 88   Temp (!) 97.5 F (36.4 C) (Oral)   Resp 16   Ht 5' 9"  (1.753 m)   Wt 88.9 kg   LMP 11/15/2013   SpO2 98%   BMI 28.94 kg/m   Physical Exam Vitals and nursing note reviewed.  Constitutional:      General: She is not in acute distress.    Appearance: Normal appearance. She is not ill-appearing, toxic-appearing or diaphoretic.  HENT:     Head: Normocephalic.     Mouth/Throat:     Mouth: Mucous membranes are moist.  Eyes:     Conjunctiva/sclera: Conjunctivae normal.  Pulmonary:     Effort: Pulmonary effort is normal.  Abdominal:       Comments: Surgical Gtube site with missing tube. No drainage  Skin:    General: Skin is dry.  Neurological:     Mental Status: She is alert.  Psychiatric:        Mood and Affect: Mood normal.     ED Results / Procedures / Treatments   Labs (all labs ordered are listed, but only abnormal  results are displayed) Labs Reviewed - No data to display  EKG None  Radiology IR REPLACE G-TUBE SIMPLE WO FLUORO  Result Date: 07/02/2019 INDICATION: Patient with history of tongue cancer and chronic gastrostomy tube who presents today for routine gastrostomy tube exchange and application of silver nitrate to painful  granulation tissue. EXAM: GASTROSTOMY TUBE EXCHANGE MEDICATIONS: Viscous lidocaine around gastrostomy tube insertion site ANESTHESIA/SEDATION: None CONTRAST:  None FLUOROSCOPY TIME:  None COMPLICATIONS: None immediate. PROCEDURE: Informed written consent was obtained from the patient after a thorough discussion of the procedural risks, benefits and alternatives. All questions were addressed. Maximal Sterile Barrier Technique was utilized including caps, mask, sterile gowns, sterile gloves, sterile drape, hand hygiene and skin antiseptic. A timeout was performed prior to the initiation of the procedure. The existing 53 French gastrostomy tube balloon was deflated and tube removed in its entirety. A new 18 French balloon retention gastrostomy tube was inserted, balloon inflated with 3 cc saline and tube flushed and secured to skin site. Silver nitrate was applied to granulation tissue at insertion site. No immediate complications. IMPRESSION: Successful exchange of 18 French balloon retention gastrostomy tube along with application of silver nitrate to surrounding granulation tissue. Read by: Rowe Robert, PA-C Electronically Signed   By: Jacqulynn Cadet M.D.   On: 07/02/2019 12:08    Procedures FEEDING TUBE REPLACEMENT  Date/Time: 07/02/2019 4:02 PM Performed by: Alveria Apley, PA-C Authorized by: Alveria Apley, PA-C  Preparation: Patient was prepped and draped in the usual sterile fashion. Indications: tube dislodged Local anesthesia used: no  Anesthesia: Local anesthesia used: no  Sedation: Patient sedated: no  Tube type: gastrostomy Patient position: sitting Tube size: 18 Fr Endoscope used: no Bulb inflation volume: 10 (ml) Bulb inflation fluid: normal saline Placement/position confirmation: x-ray Tube placement difficulty: none Patient tolerance: patient tolerated the procedure well with no immediate complications    (including critical care time)  Medications Ordered in  ED Medications - No data to display  ED Course  I have reviewed the triage vital signs and the nursing notes.  Pertinent labs & imaging results that were available during my care of the patient were reviewed by me and considered in my medical decision making (see chart for details).  Clinical Course as of Jul 01 1600  Mon Jul 02, 2019  1600 Patient presenting after new Gtube fell out today. Otherwise no complications. Had tube placed today by IR. Patient had 18 gauge Fr tube with her with a deflated bulb. I inspected the tube and inflated the bulb and all appeared to be working well. I re-inserted the tube into the gtube site with ease and re-inflated the bulb. Xray ordered to confirm placement and tube irrigated to confirm ease of use/    [KM]    Clinical Course User Index [KM] Kristine Royal   MDM Rules/Calculators/A&P                          Based on review of vitals, medical screening exam, lab work and/or imaging, there does not appear to be an acute, emergent etiology for the patient's symptoms. Counseled pt on good return precautions and encouraged both PCP and ED follow-up as needed.  Prior to discharge, I also discussed incidental imaging findings with patient in detail and advised appropriate, recommended follow-up in detail.  Clinical Impression: 1. Feeding tube dysfunction, initial encounter     Disposition: Discharge  Prior to providing a prescription for a  controlled substance, I independently reviewed the patient's recent prescription history on the [facility]. The patient had no recent or regular prescriptions and was deemed appropriate for a brief, less than 3 day prescription of narcotic for acute analgesia.  This note was prepared with assistance of Systems analyst. Occasional wrong-word or sound-a-like substitutions may have occurred due to the inherent limitations of voice recognition  software.  Final Clinical Impression(s) / ED Diagnoses Final diagnoses:  None    Rx / DC Orders ED Discharge Orders    None       Kristine Royal 07/03/19 1512    Valarie Merino, MD 07/07/19 1506

## 2019-07-02 NOTE — Discharge Instructions (Addendum)
Thank you for allowing [place] to place your feeding tube back today. Xray shows it is in the proper position. Please return if you have any new or worsening symptoms. Thank you for allowing me to care for you today. Please return to the emergency department if you have new or worsening symptoms. Take your medications as instructed.

## 2019-07-02 NOTE — Procedures (Signed)
Patient's existing 31 French balloon retention gastrostomy tube was exchanged out for new 51 French balloon retention gastrostomy tube.  Viscous lidocaine applied around gastrostomy tube insertion site tract ;no immediate complications.  Silver nitrate was also applied to surrounding granulation tissue.  Tube secured to skin site and flushed without difficulty.

## 2019-07-02 NOTE — ED Triage Notes (Signed)
Patient states her g-tube fell out approx 10 minutes ago. Patient states her g-tube is new that she brought with her.

## 2019-07-03 ENCOUNTER — Telehealth: Payer: Self-pay | Admitting: Rehabilitative and Restorative Service Providers"

## 2019-07-03 ENCOUNTER — Encounter: Payer: BC Managed Care – PPO | Admitting: Rehabilitative and Restorative Service Providers"

## 2019-07-03 ENCOUNTER — Other Ambulatory Visit: Payer: Self-pay | Admitting: Family Medicine

## 2019-07-03 DIAGNOSIS — I10 Essential (primary) hypertension: Secondary | ICD-10-CM

## 2019-07-03 NOTE — Telephone Encounter (Signed)
Spoke to Kristen [name] and reminded her of her Friday appointment.

## 2019-07-06 ENCOUNTER — Encounter: Payer: BC Managed Care – PPO | Admitting: Rehabilitative and Restorative Service Providers"

## 2019-07-10 ENCOUNTER — Encounter: Payer: BC Managed Care – PPO | Admitting: Rehabilitative and Restorative Service Providers"

## 2019-07-10 ENCOUNTER — Encounter: Payer: Self-pay | Admitting: Specialist

## 2019-07-12 ENCOUNTER — Ambulatory Visit: Payer: BC Managed Care – PPO | Admitting: Specialist

## 2019-07-15 ENCOUNTER — Encounter: Payer: Self-pay | Admitting: Family Medicine

## 2019-07-20 ENCOUNTER — Encounter: Payer: Self-pay | Admitting: Family Medicine

## 2019-07-20 NOTE — Telephone Encounter (Signed)
You need to schedule an appointment in the office

## 2019-07-30 ENCOUNTER — Encounter: Payer: Self-pay | Admitting: Family Medicine

## 2019-07-30 ENCOUNTER — Other Ambulatory Visit (INDEPENDENT_AMBULATORY_CARE_PROVIDER_SITE_OTHER): Payer: Self-pay | Admitting: Specialist

## 2019-07-30 ENCOUNTER — Ambulatory Visit (INDEPENDENT_AMBULATORY_CARE_PROVIDER_SITE_OTHER): Payer: BC Managed Care – PPO | Admitting: Family Medicine

## 2019-07-30 ENCOUNTER — Other Ambulatory Visit: Payer: Self-pay

## 2019-07-30 VITALS — BP 90/70 | HR 62 | Temp 98.5°F | Ht 69.0 in | Wt 204.0 lb

## 2019-07-30 DIAGNOSIS — I959 Hypotension, unspecified: Secondary | ICD-10-CM | POA: Diagnosis not present

## 2019-07-30 DIAGNOSIS — R58 Hemorrhage, not elsewhere classified: Secondary | ICD-10-CM | POA: Diagnosis not present

## 2019-07-30 DIAGNOSIS — R600 Localized edema: Secondary | ICD-10-CM | POA: Diagnosis not present

## 2019-07-30 DIAGNOSIS — L853 Xerosis cutis: Secondary | ICD-10-CM

## 2019-07-30 DIAGNOSIS — E876 Hypokalemia: Secondary | ICD-10-CM

## 2019-07-30 DIAGNOSIS — R229 Localized swelling, mass and lump, unspecified: Secondary | ICD-10-CM

## 2019-07-30 DIAGNOSIS — Z789 Other specified health status: Secondary | ICD-10-CM

## 2019-07-30 DIAGNOSIS — Z7289 Other problems related to lifestyle: Secondary | ICD-10-CM

## 2019-07-30 LAB — POCT URINALYSIS DIPSTICK
Bilirubin, UA: NEGATIVE
Blood, UA: NEGATIVE
Glucose, UA: NEGATIVE
Ketones, UA: NEGATIVE
Nitrite, UA: NEGATIVE
Protein, UA: NEGATIVE
Spec Grav, UA: 1.02 (ref 1.010–1.025)
Urobilinogen, UA: 0.2 E.U./dL
pH, UA: 6 (ref 5.0–8.0)

## 2019-07-30 NOTE — Progress Notes (Signed)
Subjective:    Patient ID: Kristen [name], female    DOB: [birth year], 54 y.o.   MRN: [id number]  No chief complaint on file.   HPI Pt is a yo female with pmh sig for HTN, h/o adenopod cystic carcinoma of head and neck, DDD, GERD, spondylosis of lumbar spine with radiculopathy who was seen today for f/u. Pt notes increased LE edema times a few weeks.  Pt started taking her mother's fluid pills BID x last 3 days.  Taking potassium nightly with this medication.  Pt also notes easy bruising x 2 months, dry skin, wart on R nares x "a while".  Pt tried OTC wart removal products on area without complete resolution, removed top layer of skin but bump remains.  Endorses decreased appetite.  May eat grits in a.m. and macaroni cheese.  Pt not drinking much water.  Decreased ethanol intake, went from vodka to brown liquor (E&J ).  Since decreasing sodium and soda intake swelling has become worse.  Pt has G-tube in place.  Past Medical History:  Diagnosis Date  . Allergy   . Anemia    Iron deficiency  . Anxiety   . Arthritis    back, left shoulder  . Blood transfusion without reported diagnosis   . Chicken pox   . Depression   . Diarrhea    takes Imodium daily  . GERD (gastroesophageal reflux disease)   . H/O hiatal hernia   . Headache(784.0)   . History of kidney stones    1996ish  . History of radiation therapy 11/16/16- 01/01/17   Base of Tongue/ 66 gy in 33 fractions/ Dose: 2 Gy  . Hypertension   . Iron deficiency anemia 12/11/2018  . Migraine    none for 5 years (as of 01/13/16)  . OSA (obstructive sleep apnea) 10/13/2015   unable to get cpap, plans to get one in 2018  . Pneumonia   . Restless legs   . Scoliosis   . Shingles 08/28/2013  . Shortness of breath    with exertion  . Sleep apnea   . Tongue cancer ([facility])    tongue cancer    Allergies  Allergen Reactions  . Other Hives and Swelling    tomato  . Sulfur Hives and Swelling    ROS General: Denies fever, chills, night  sweats, changes in weight +changes in appetite HEENT: Denies headaches, ear pain, changes in vision, rhinorrhea, sore throat CV: Denies CP, palpitations, SOB, orthopnea  +LE edema Pulm: Denies SOB, cough, wheezing GI: Denies abdominal pain, nausea, vomiting, diarrhea, constipation GU: Denies dysuria, hematuria, frequency, vaginal discharge Msk: Denies joint pains  +LE edema, muscle cramps Neuro: Denies weakness, numbness, tingling Skin: Denies rashes  +bruising, dry skin, bump on nose Psych: Denies depression, anxiety, hallucinations    Objective:    Blood pressure 90/70, pulse 62, temperature 98.5 F (36.9 C), temperature source Other (Comment), height 5' 9"  (1.753 m), weight 204 lb (92.5 kg), last menstrual period 11/15/2013, SpO2 96 %.  Gen. Pleasant, well-nourished, in no distress, normal affect   HEENT: [place]/AT, face symmetric, conjunctiva clear, no scleral icterus, PERRLA, EOMI, nares patent without drainage, pharynx without erythema or exudate. Lungs: no accessory muscle use, CTAB, no wheezes or rales Cardiovascular: RRR, no m/r/g, 1+ pitting edema Abdomen: BS present, soft, NT/ND Musculoskeletal: No deformities, no cyanosis or clubbing, normal tone Neuro:  A&Ox3, CN II-XII intact, normal gait Skin:  Warm, dry, no lesions/ rash.  Mild yellow/golden hue noted on forearms.  Rounded  papule noted on right naris with abrasion of skin on top of patient and small on blood vessels visible.  Wt Readings from Last 3 Encounters:  07/02/19 196 lb (88.9 kg)  05/09/19 191 lb (86.6 kg)  05/07/19 190 lb (86.2 kg)    Lab Results  Component Value Date   WBC 4.0 03/07/2019   HGB 12.9 03/07/2019   HCT 39.8 03/07/2019   PLT 178.0 03/07/2019   GLUCOSE 84 12/20/2018   ALT 13 12/20/2018   AST 16 12/20/2018   NA 143 12/20/2018   K 4.3 12/20/2018   CL 106 12/20/2018   CREATININE 0.73 12/20/2018   BUN 14 12/20/2018   CO2 29 12/20/2018   TSH 1.697 03/30/2019   INR 1.1 11/01/2018   HGBA1C 5.6  06/18/2015    Assessment/Plan:  Bilateral lower extremity edema  -likely 2/2 protein def given decreased intake, medication-norvasc -supportive care: elevation, TED hose, compression socks -given current hypotension would be hesitant to continue lasix -consider d/c'ing Norvasc as may be contributing -further recommendations based on lab results. - Plan: POCT urinalysis dipstick, Urine Culture, Brain Natriuretic Peptide, CBC (no diff)  Hypotension, unspecified hypotension type  -repeat 99/72 -likely 2/2 dehydration and recent diuretic use. -discussed increasing fluid and nutritional intake. -if needed use G tube -pt to check bp at home.  For continued hypotension hold norvasc 10 mg - Plan: Comprehensive metabolic panel, POCT urinalysis dipstick  Dry skin -discussed possible causes including decreased po intake, dehydration, thyroid dysfunction, nutritional causes.  Also consider glycemic causes. - Plan: Urine Culture, T4, Free, TSH  Ecchymosis  - Plan: Comprehensive metabolic panel, APTT, Protime-INR  Skin nodule  -difficult to discern type of lesion given current appearance with skin abrasion -pt advised to refrain from manipulating the nodule. -discussed referral to Derm for removal and possible path - Plan: Ambulatory referral to Dermatology  Alcohol use  -pt decreasing EtOH intake -small meals throughout the day encouraged if possible.  Meal supplemental drinks encouraged. -pt encouraged to use G tube if unable to eat.   - Plan: Folate, Vitamin B12   Update:  Labs with hypokalemia, 3.0 Hypokalemia  - Plan: potassium chloride SA (KLOR-CON) 20 MEQ tablet  F/u in the next few days as needed.  Grier Mitts, MD

## 2019-07-30 NOTE — Patient Instructions (Signed)
Hypotension As your heart beats, it forces blood through your body. This force is called blood pressure. If you have hypotension, you have low blood pressure. When your blood pressure is too low, you may not get enough blood to your brain or other parts of your body. This may cause you to feel weak, light-headed, have a fast heartbeat, or even pass out (faint). Low blood pressure may be harmless, or it may cause serious problems. What are the causes?  Blood loss.  Not enough water in the body (dehydration).  Heart problems.  Hormone problems.  Pregnancy.  A very bad infection.  Not having enough of certain nutrients.  Very bad allergic reactions.  Certain medicines. What increases the risk?  Age. The risk increases as you get older.  Conditions that affect the heart or the brain and spinal cord (central nervous system).  Taking certain medicines.  Being pregnant. What are the signs or symptoms?  Feeling: ? Weak. ? Light-headed. ? Dizzy. ? Tired (fatigued).  Blurred vision.  Fast heartbeat.  Passing out, in very bad cases. How is this treated?  Changing your diet. This may involve eating more salt (sodium) or drinking more water.  Taking medicines to raise your blood pressure.  Changing how much you take (the dosage) of some of your medicines.  Wearing compression stockings. These stockings help to prevent blood clots and reduce swelling in your legs. In some cases, you may need to go to the hospital for:  Fluid replacement. This means you will receive fluids through an IV tube.  Blood replacement. This means you will receive donated blood through an IV tube (transfusion).  Treating an infection or heart problems, if this applies.  Monitoring. You may need to be monitored while medicines that you are taking wear off. Follow these instructions at home: Eating and drinking   Drink enough fluids to keep your pee (urine) pale yellow.  Eat a healthy diet.  Follow instructions from your doctor about what you can eat or drink. A healthy diet includes: ? Fresh fruits and vegetables. ? Whole grains. ? Low-fat (lean) meats. ? Low-fat dairy products.  Eat extra salt only as told. Do not add extra salt to your diet unless your doctor tells you to.  Eat small meals often.  Avoid standing up quickly after you eat. Medicines  Take over-the-counter and prescription medicines only as told by your doctor. ? Follow instructions from your doctor about changing how much you take of your medicines, if this applies. ? Do not stop or change any of your medicines on your own. General instructions   Wear compression stockings as told by your doctor.  Get up slowly from lying down or sitting.  Avoid hot showers and a lot of heat as told by your doctor.  Return to your normal activities as told by your doctor. Ask what activities are safe for you.  Do not use any products that contain nicotine or tobacco, such as cigarettes, e-cigarettes, and chewing tobacco. If you need help quitting, ask your doctor.  Keep all follow-up visits as told by your doctor. This is important. Contact a doctor if:  You throw up (vomit).  You have watery poop (diarrhea).  You have a fever for more than 2-3 days.  You feel more thirsty than normal.  You feel weak and tired. Get help right away if:  You have chest pain.  You have a fast or uneven heartbeat.  You lose feeling (have numbness) in any  part of your body.  You cannot move your arms or your legs.  You have trouble talking.  You get sweaty or feel light-headed.  You pass out.  You have trouble breathing.  You have trouble staying awake.  You feel mixed up (confused). Summary  Hypotension is also called low blood pressure. It is when the force of blood pumping through your arteries is too weak.  Hypotension may be harmless, or it may cause serious problems.  Treatment may include changing  your diet and medicines, and wearing compression stockings.  In very bad cases, you may need to go to the hospital. This information is not intended to replace advice given to you by your health care provider. Make sure you discuss any questions you have with your health care provider. Document Revised: 06/30/2017 Document Reviewed: 06/30/2017 Elsevier Patient Education  [place].  Alcohol Abuse and Nutrition Alcohol abuse is any pattern of alcohol consumption that harms your health, relationships, or work. Alcohol abuse can cause poor nutrition (malnutrition or malnourishment) and a lack of nutrients (nutrient deficiencies), which can lead to more complications. Alcohol abuse brings malnutrition and nutrient deficiencies in two ways:  It causes your liver to work abnormally. This affects how your body divides (breaks down) and absorbs nutrients from food.  It causes you to eat poorly. Many people who abuse alcohol do not eat enough carbohydrates, protein, fat, vitamins, and minerals. Nutrients that are commonly lacking (deficient) in people who abuse alcohol include:  Vitamins. ? Vitamin A. This is needed for your vision, metabolism, and ability to fight off infections (immunity). ? B vitamins. These include folate, thiamine, and niacin. These are needed for new cell growth. ? Vitamin C. This plays an important role in wound healing, immunity, and helping your body to absorb iron. ? Vitamin D. This is necessary for your body to absorb and use calcium. It is produced by your liver, but you can also get it from food and from sun exposure.  Minerals. ? Calcium. This is needed for healthy bones as well as heart and blood vessel (cardiovascular) function. ? Iron. This is important for blood, muscle, and nervous system functioning. ? Magnesium. This plays an important role in muscle and nerve function, and it helps to control blood sugar and blood pressure. ? Zinc. This is important for  the normal functioning of your nervous system and digestive system (gastrointestinal tract). If you think that you have an alcohol dependency problem, or if it is hard to stop drinking because you feel sick or different when you do not use alcohol, talk with your health care provider or another health professional about where to get help. Nutrition is an essential factor in therapy for alcohol abuse. Your health care provider or diet and nutrition specialist (dietitian) will work with you to design a plan that can help to restore nutrients to your body and prevent the risk of complications. What is my plan? Your dietitian may develop a specific eating plan that is based on your condition and any other problems that you have. An eating plan will commonly include:  A balanced diet. ? Grains: 6-8 oz (170-227 g) a day. Examples of 1 oz of whole grains include 1 cup of whole-wheat cereal,  cup of brown rice, or 1 slice of whole-wheat bread. ? Vegetables: 2-3 cups a day. Examples of 1 cup of vegetables include 2 medium carrots, 1 large tomato, or 2 stalks of celery. ? Fruits: 1-2 cups a day. Examples  of 1 cup of fruit include 1 large banana, 1 small apple, 8 large strawberries, or 1 large orange. ? Meat and other protein: 5-6 oz (142-170 g) a day.  A cut of meat or fish that is the size of a deck of cards is about 3-4 oz.  Foods that provide 1 oz of protein include 1 egg,  cup of nuts or seeds, or 1 tablespoon (16 g) of peanut butter. ? Dairy: 2-3 cups a day. Examples of 1 cup of dairy include 8 oz (230 mL) of milk, 8 oz (230 g) of yogurt, or 1 oz (44 g) of natural cheese.  Vitamin and mineral supplements. What are tips for following this plan?  Eat frequent meals and snacks. Try to eat 5-6 small meals each day.  Take vitamin or mineral supplements as recommended by your dietitian.  If you are malnourished or if your dietitian recommends it: ? You may follow a high-protein, high-calorie diet.  This may include:  2,000-3,000 calories (kilocalories) a day.  70-100 g (grams) of protein a day. ? You may be directed to follow a diet that includes a complete nutritional supplement beverage. This can help to restore calories, protein, and vitamins to your body. Depending on your condition, you may be advised to consume this beverage instead of your meals or in addition to them.  Certain medicines may cause changes in your appetite, taste, and weight. Work with your health care provider and dietitian to make any changes to your medicines and eating plan.  If you are unable to take in enough food and calories by mouth, your health care provider may recommend a feeding tube. This tube delivers nutritional supplements directly to your stomach. Recommended foods  Eat foods that are high in molecules that prevent oxygen from reacting with your food (antioxidants). These foods include grapes, berries, nuts, green tea, and dark green or orange vegetables. Eating these can help to prevent some of the stress that is placed on your liver by consuming alcohol.  Eat a variety of fresh fruits and vegetables each day. This will help you to get fiber and vitamins in your diet.  Drink plenty of water and other clear fluids, such as apple juice and broth. Try to drink at least 48-64 oz (1.5-2 L) of water a day.  Include foods fortified with vitamins and minerals in your diet. Commonly fortified foods include milk, orange juice, cereal, and bread.  Eat a variety of foods that are high in omega-3 and omega-6 fatty acids. These include fish, nuts and seeds, and soybeans. These foods may help your liver to recover and may also stabilize your mood.  If you are a vegetarian: ? Eat a variety of protein-rich foods. ? Pair whole grains with plant-based proteins at meals and snack time. For example, eat rice with beans, put peanut butter on whole-grain toast, or eat oatmeal with sunflower seeds. The items listed  above may not be a complete list of foods and beverages you can eat. Contact a dietitian for more information. Foods to avoid  Avoid foods and drinks that are high in fat and sugar. Sugary drinks, salty snacks, and candy contain empty calories. This means that they lack important nutrients such as protein, fiber, and vitamins.  Avoid alcohol. This is the best way to avoid malnutrition due to alcohol abuse. If you must drink, drink measured amounts. Measured drinking means limiting your intake to no more than 1 drink a day for nonpregnant women and 2 drinks  a day for men. One drink equals 12 oz (355 mL) of beer, 5 oz (148 mL) of wine, or 1 oz (44 mL) of hard liquor.  Limit your intake of caffeine. Replace drinks like coffee and black tea with decaffeinated coffee and decaffeinated herbal tea. The items listed above may not be a complete list of foods and beverages you should avoid. Contact a dietitian for more information. Summary  Alcohol abuse can cause poor nutrition (malnutrition or malnourishment) and a lack of nutrients (nutrient deficiencies), which can lead to more health problems.  Common nutrient deficiencies include vitamin deficiencies (A, B, C, and D) and mineral deficiencies (calcium, iron, magnesium, and zinc).  Nutrition is an essential factor in therapy for alcohol abuse.  Your health care provider and dietitian can help you to develop a specific eating plan that includes a balanced diet plus vitamin and mineral supplements. This information is not intended to replace advice given to you by your health care provider. Make sure you discuss any questions you have with your health care provider. Document Revised: 04/25/2018 Document Reviewed: 09/21/2016 Elsevier Patient Education  [place].

## 2019-07-31 LAB — COMPREHENSIVE METABOLIC PANEL
AG Ratio: 1.7 (calc) (ref 1.0–2.5)
ALT: 12 U/L (ref 6–29)
AST: 17 U/L (ref 10–35)
Albumin: 4.1 g/dL (ref 3.6–5.1)
Alkaline phosphatase (APISO): 93 U/L (ref 37–153)
BUN: 9 mg/dL (ref 7–25)
CO2: 30 mmol/L (ref 20–32)
Calcium: 8.8 mg/dL (ref 8.6–10.4)
Chloride: 106 mmol/L (ref 98–110)
Creat: 0.7 mg/dL (ref 0.50–1.05)
Globulin: 2.4 g/dL (calc) (ref 1.9–3.7)
Glucose, Bld: 91 mg/dL (ref 65–99)
Potassium: 3 mmol/L — ABNORMAL LOW (ref 3.5–5.3)
Sodium: 145 mmol/L (ref 135–146)
Total Bilirubin: 0.9 mg/dL (ref 0.2–1.2)
Total Protein: 6.5 g/dL (ref 6.1–8.1)

## 2019-07-31 LAB — CBC
HCT: 39.6 % (ref 35.0–45.0)
Hemoglobin: 12.8 g/dL (ref 11.7–15.5)
MCH: 30 pg (ref 27.0–33.0)
MCHC: 32.3 g/dL (ref 32.0–36.0)
MCV: 93 fL (ref 80.0–100.0)
MPV: 10.7 fL (ref 7.5–12.5)
Platelets: 192 10*3/uL (ref 140–400)
RBC: 4.26 10*6/uL (ref 3.80–5.10)
RDW: 12.5 % (ref 11.0–15.0)
WBC: 4 10*3/uL (ref 3.8–10.8)

## 2019-07-31 LAB — T4, FREE: Free T4: 1.4 ng/dL (ref 0.8–1.8)

## 2019-07-31 LAB — URINE CULTURE
MICRO NUMBER:: 10694013
Result:: NO GROWTH
SPECIMEN QUALITY:: ADEQUATE

## 2019-07-31 LAB — TSH: TSH: 2.58 mIU/L

## 2019-07-31 LAB — VITAMIN B12: Vitamin B-12: 732 pg/mL (ref 200–1100)

## 2019-07-31 LAB — BRAIN NATRIURETIC PEPTIDE: Brain Natriuretic Peptide: 97 pg/mL (ref <100)

## 2019-07-31 LAB — APTT: aPTT: 29 s (ref 23–32)

## 2019-07-31 LAB — FOLATE: Folate: >24 ng/mL

## 2019-07-31 LAB — PROTIME-INR
INR: 1
Prothrombin Time: 10.9 s (ref 9.0–11.5)

## 2019-08-01 MED ORDER — POTASSIUM CHLORIDE CRYS ER 20 MEQ PO TBCR: EXTENDED_RELEASE_TABLET | ORAL | 0 refills | Status: DC

## 2019-08-05 ENCOUNTER — Encounter: Payer: Self-pay | Admitting: Family Medicine

## 2019-08-05 DIAGNOSIS — Z789 Other specified health status: Secondary | ICD-10-CM | POA: Insufficient documentation

## 2019-08-05 DIAGNOSIS — Z7289 Other problems related to lifestyle: Secondary | ICD-10-CM | POA: Insufficient documentation

## 2019-08-05 DIAGNOSIS — R600 Localized edema: Secondary | ICD-10-CM | POA: Insufficient documentation

## 2019-08-05 DIAGNOSIS — F1090 Alcohol use, unspecified, uncomplicated: Secondary | ICD-10-CM | POA: Insufficient documentation

## 2019-08-08 ENCOUNTER — Encounter: Payer: Self-pay | Admitting: *Deleted

## 2019-08-11 ENCOUNTER — Other Ambulatory Visit: Payer: Self-pay | Admitting: Family Medicine

## 2019-08-11 DIAGNOSIS — E876 Hypokalemia: Secondary | ICD-10-CM

## 2019-08-20 ENCOUNTER — Other Ambulatory Visit: Payer: Self-pay

## 2019-08-20 ENCOUNTER — Encounter: Payer: Self-pay | Admitting: Specialist

## 2019-08-20 ENCOUNTER — Ambulatory Visit (INDEPENDENT_AMBULATORY_CARE_PROVIDER_SITE_OTHER): Payer: BC Managed Care – PPO | Admitting: Specialist

## 2019-08-20 VITALS — BP 111/75 | HR 69 | Ht 69.0 in | Wt 206.0 lb

## 2019-08-20 DIAGNOSIS — M4726 Other spondylosis with radiculopathy, lumbar region: Secondary | ICD-10-CM | POA: Diagnosis not present

## 2019-08-20 DIAGNOSIS — M533 Sacrococcygeal disorders, not elsewhere classified: Secondary | ICD-10-CM

## 2019-08-20 DIAGNOSIS — R600 Localized edema: Secondary | ICD-10-CM | POA: Diagnosis not present

## 2019-08-20 DIAGNOSIS — M4316 Spondylolisthesis, lumbar region: Secondary | ICD-10-CM

## 2019-08-20 NOTE — Patient Instructions (Signed)
Avoid frequent bending and stooping  No lifting greater than 10 lbs. May use ice or moist heat for pain. Weight loss is of benefit. Decrease use of gabapentin you are taking 346m twice a day. Elevate the legs on recliner and  Obtain compression stockings to help decrease your diet.. Exercise is important to improve your indurance and does allow people to function better inspite of back pain.

## 2019-08-20 NOTE — Progress Notes (Signed)
Office Visit Note   Patient: Kristen [name]           Date of Birth: 1965-09-11           MRN: [id number] Visit Date: 08/20/2019              Requested by: [name], MD [place],  [place] [address] PCP: [name], MD   Assessment & Plan: Visit Diagnoses:  1. Bilateral lower extremity edema   2. Sacroiliac joint disease   3. Other spondylosis with radiculopathy, lumbar region   4. Spondylolisthesis of lumbar region     Plan: Avoid frequent bending and stooping  No lifting greater than 10 lbs. May use ice or moist heat for pain. Weight loss is of benefit. Decrease use of gabapentin you are taking 333m twice a day. Elevate the legs on recliner and  Obtain compression stockings to help decrease your diet.. Exercise is important to improve your indurance and does allow people to function better inspite of back pain.  Follow-Up Instructions: No follow-ups on file.   Orders:  No orders of the defined types were placed in this encounter.  No orders of the defined types were placed in this encounter.     Procedures: No procedures performed   Clinical Data: No additional findings.   Subjective: Chief Complaint  Patient presents with  . Lower Back - Pain    54year old female with history of back pain and had recent episode after helping her mother to move. Her BP is decreased and she is having swelling in her legs bilaterally. She reports her lab work is returning normally. She is to [facility] her cardiologist[facility] No bowel or bladder difficulty. Was nauseated anda throwing up this past weekend. That is now better.    Review of Systems  Constitutional: Negative.   HENT: Negative.   Eyes: Negative.   Respiratory: Negative.   Gastrointestinal: Positive for diarrhea, nausea and vomiting.  Endocrine: Negative.   Genitourinary: Negative.   Musculoskeletal: Positive for back pain. Negative for  arthralgias, gait problem, joint swelling, myalgias, neck pain and neck stiffness.  Skin: Negative.  Negative for color change, pallor, rash and wound.  Allergic/Immunologic: Negative.  Negative for environmental allergies, food allergies and immunocompromised state.  Neurological: Negative.  Negative for dizziness, tremors, seizures, syncope, facial asymmetry, speech difficulty, weakness, light-headedness, numbness and headaches.  Hematological: Negative.  Negative for adenopathy. Does not bruise/bleed easily.  Psychiatric/Behavioral: Negative.      Objective: Vital Signs: BP 111/75 (BP Location: Left Arm, Patient Position: Sitting)   Pulse 69   Ht 5' 9"  (1.753 m)   Wt 206 lb (93.4 kg)   LMP 11/15/2013   BMI 30.42 kg/m   Physical Exam  Ortho Exam  Specialty Comments:  No specialty comments available.  Imaging: No results found.   PMFS History: Patient Active Problem List   Diagnosis Date Noted  . Spinal stenosis of lumbar region 01/20/2016    Priority: High    Class: Chronic  . DDD (degenerative disc disease), lumbar 01/20/2016    Priority: High    Class: Chronic  . Alcohol use 08/05/2019  . Bilateral lower extremity edema 08/05/2019  . Peripheral edema 03/12/2019  . Iron deficiency anemia 12/11/2018  . Loosening of hardware in spine ([place]   . Other spondylosis with radiculopathy, lumbar region   . History of lumbar spinal fusion 11/06/2018  . UTI (urinary  tract infection) 05/15/2017  . 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 Malignant neoplasm of base of tongue ([place]) 10/29/2016  . 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[place]) 10/29/2016  . Headache associated with sexual activity 05/14/2016  . Tongue lesion 05/14/2016  . Thrombocytopenia ([place]) 01/22/2016  . Chronic diarrhea 01/22/2016  . Chest pain   . Essential hypertension   . Spinal stenosis of lumbar region with neurogenic claudication 01/20/2016  . Low back pain 12/22/2015  .  Hypersomnolence 10/13/2015  . Muscle cramp 08/01/2015  . GERD (gastroesophageal reflux disease) 07/09/2015  . Excessive daytime sleepiness 06/18/2015  . Morbid obesity with BMI of 40.0-44.9, adult ([place]) 01/08/2013   Past Medical History:  Diagnosis Date  . Allergy   . Anemia    Iron deficiency  . Anxiety   . Arthritis    back, left shoulder  . Blood transfusion without reported diagnosis   . Chicken pox   . Depression   . Diarrhea    takes Imodium daily  . GERD (gastroesophageal reflux disease)   . H/O hiatal hernia   . Headache(784.0)   . History of kidney stones    1996ish  . History of radiation therapy 11/16/16- 01/01/17   Base of Tongue/ 66 gy in 33 fractions/ Dose: 2 Gy  . Hypertension   . Iron deficiency anemia 12/11/2018  . Migraine    none for 5 years (as of 01/13/16)  . OSA (obstructive sleep apnea) 10/13/2015   unable to get cpap, plans to get one in 2018  . Pneumonia   . Restless legs   . Scoliosis   . Shingles 08/28/2013  . Shortness of breath    with exertion  . Sleep apnea   . Tongue cancer ([place])    tongue cancer    Family History  Problem Relation Age of Onset  . Heart disease Father   . Heart attack Father   . Hypertension Mother   . Arthritis Mother   . Diabetes Maternal Grandmother   . Diabetes Paternal Grandmother   . Diabetes Maternal Uncle   . Colon polyps Neg Hx   . Colon cancer Neg Hx   . Esophageal cancer Neg Hx   . Stomach cancer Neg Hx   . Rectal cancer Neg Hx     Past Surgical History:  Procedure Laterality Date  . BACK SURGERY  07/21/1978  . CHOLECYSTECTOMY N/A 08/15/2013   Procedure: LAPAROSCOPIC CHOLECYSTECTOMY WITH INTRAOPERATIVE CHOLANGIOGRAM;  Surgeon: Gwenyth Ober, MD;  Location: [facility];  Service: General;  Laterality: N/A;  . COLONOSCOPY N/A 01/09/2013   Procedure: COLONOSCOPY;  Surgeon: Beryle Beams, MD;  Location: [facility];  Service: Endoscopy;  Laterality: N/A;  . COLONOSCOPY  03/2018  . DIRECT LARYNGOSCOPY   07/2016   Dr. Nicolette Bang [facility]  . ESOPHAGOGASTRODUODENOSCOPY  03/2018  . ESOPHAGOGASTRODUODENOSCOPY (EGD) WITH PROPOFOL N/A 04/24/2019   Procedure: ESOPHAGOGASTRODUODENOSCOPY (EGD) WITH PROPOFOL;  Surgeon: Gatha Mayer, MD;  Location: [facility];  Service: Endoscopy;  Laterality: N/A;  . GASTROSTOMY TUBE PLACEMENT  09/27/2016  . GIVENS CAPSULE STUDY N/A 04/24/2019   Procedure: GIVENS CAPSULE STUDY;  Surgeon: Gatha Mayer, MD;  Location: [facility];  Service: Endoscopy;  Laterality: N/A;  . HERNIA REPAIR Left 1981  . IR PATIENT EVAL TECH 0-60 MINS  12/13/2016  . IR PATIENT EVAL TECH 0-60 MINS  04/11/2017  . IR PATIENT EVAL TECH 0-60 MINS  03/24/2018  . IR PATIENT EVAL TECH 0-60 MINS  11/23/2018  .  IR PATIENT EVAL TECH 0-60 MINS  05/28/2019  . IR REPLACE G-TUBE SIMPLE WO FLUORO  12/12/2017  . IR REPLACE G-TUBE SIMPLE WO FLUORO  03/29/2018  . IR REPLACE G-TUBE SIMPLE WO FLUORO  06/29/2018  . IR REPLACE G-TUBE SIMPLE WO FLUORO  11/02/2018  . IR REPLACE G-TUBE SIMPLE WO FLUORO  03/08/2019  . IR REPLACE G-TUBE SIMPLE WO FLUORO  07/02/2019  . MODIFIED RADICAL NECK DISSECTION Left 09/27/2016   Levels 1 & 2  . PARTIAL GLOSSECTOMY Left 09/27/2016   Left hemi partial glossectomy  . SPINE SURGERY  01/20/2016   fusion  . TONSILLECTOMY    . tracheotomy  09/27/2016  . TUBAL LIGATION  06/1988   Social History   Occupational History  . Occupation: Air cabin crew  Tobacco Use  . Smoking status: Never Smoker  . Smokeless tobacco: Never Used  Vaping Use  . Vaping Use: Never used  Substance and Sexual Activity  . Alcohol use: Yes    Alcohol/week: 7.0 standard drinks    Types: 7 Cans of beer per week  . Drug use: No  . Sexual activity: Yes    Birth control/protection: Surgical

## 2019-08-21 ENCOUNTER — Encounter: Payer: Self-pay | Admitting: Specialist

## 2019-08-24 ENCOUNTER — Other Ambulatory Visit: Payer: Self-pay | Admitting: Specialist

## 2019-08-24 MED ORDER — JOBST 20-30MMHG COMPRESSION SM MISC: 1.0000 [IU] | Freq: Every day | 1 refills | Status: DC

## 2019-08-29 ENCOUNTER — Telehealth: Payer: Self-pay | Admitting: Specialist

## 2019-08-29 ENCOUNTER — Telehealth: Payer: Self-pay

## 2019-08-29 NOTE — Telephone Encounter (Signed)
Holding for [employer]. Patient left My Chart message as well. She is requesting rx for compression legging as well as compression hose.

## 2019-08-29 NOTE — Telephone Encounter (Signed)
Patient called requesting a prescription for compression stockings. Please call patient about this matter at [phone].

## 2019-08-30 NOTE — Telephone Encounter (Signed)
I called and notified her that the rx is downstairs, and it is in te "W" section in the bin.

## 2019-08-30 NOTE — Telephone Encounter (Signed)
I called and advised patient that it is at the front desk for her to pick up.

## 2019-09-10 ENCOUNTER — Encounter: Payer: Self-pay | Admitting: Internal Medicine

## 2019-09-10 ENCOUNTER — Ambulatory Visit (INDEPENDENT_AMBULATORY_CARE_PROVIDER_SITE_OTHER): Payer: BC Managed Care – PPO | Admitting: Internal Medicine

## 2019-09-10 ENCOUNTER — Other Ambulatory Visit: Payer: Self-pay

## 2019-09-10 DIAGNOSIS — C760 Malignant neoplasm of head, face and neck: Secondary | ICD-10-CM

## 2019-09-10 DIAGNOSIS — G4710 Hypersomnia, unspecified: Secondary | ICD-10-CM | POA: Diagnosis not present

## 2019-09-10 DIAGNOSIS — R600 Localized edema: Secondary | ICD-10-CM | POA: Diagnosis not present

## 2019-09-10 MED ORDER — AMPHETAMINE-DEXTROAMPHET ER 20 MG PO CP24: 20.0000 mg | ORAL_CAPSULE | Freq: Every day | ORAL | 0 refills | Status: DC

## 2019-09-10 MED ORDER — AMPHETAMINE-DEXTROAMPHETAMINE 10 MG PO TABS
ORAL_TABLET | ORAL | 0 refills | Status: DC
Start: 2019-09-10 — End: 2020-10-28

## 2019-09-10 NOTE — Patient Instructions (Signed)
Adderall 10 mg refilled  New prescription added for Adderall 20 mg extended release, to take 1 each morning  Naps are still better than medicine when you can.  Please cal as needed

## 2019-09-10 NOTE — Progress Notes (Signed)
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 Breathing is at patient's baseline.  Retired from bus driving. Despite  Repeated spine surgery, she can't tolerate bumping up and down with driving the bus. Applied for disability. Notes increased heart burn- treated.   Notes swelling in feet - has appointment to see PCP.  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 Frequent nqps. No cataplexy.  Not working any more- disabled Cardiology following for bilateral leg edema  ROS-see HPI   + = positive Constitutional:    weight loss, night sweats, fevers, chills, +fatigue, lassitude. HEENT:    headaches, difficulty swallowing, tooth/dental problems, sore throat,       sneezing, itching, ear ache, nasal congestion, post nasal drip, snoring CV:    chest pain,  orthopnea, PND, +swelling in lower extremities, anasarca,                                                    dizziness, palpitations Resp:   shortness of breath with exertion or at rest.                productive cough,   non-productive cough, coughing up of blood.              change in color of mucus.  wheezing.   Skin:    rash or lesions. GI:    + heartburn, indigestion, abdominal pain, nausea, vomiting, diarrhea,                 change in bowel habits, loss of appetite GU: dysuria, change in color of urine, no urgency or frequency.   flank pain. MS:   joint pain, stiffness, decreased range of motion, back pain. Neuro-     nothing unusual Psych:  change in mood or affect.  depression or anxiety.   memory loss.  OBJ- Physical Exam General- Alert, Oriented, Affect-appropriate, Distress- none acute,  Skin- rash-none, lesions- none, excoriation- none Lymphadenopathy- none Head- atraumatic            Eyes- Gross vision intact, PERRLA, conjunctivae and secretions clear            Ears- Hearing, canals-normal            Nose- Clear, no-Septal dev, mucus, polyps, erosion, perforation             Throat- Mallampati III-IV , mucosa clear , drainage- none, tonsils- atrophic, + edentulous                       +  speech lightly slurred- chronic, + hoarse Neck- flexible , trachea midline, no stridor , thyroid nl, carotid no bruit Chest - symmetrical excursion , unlabored           Heart/CV- RRR , no murmur , no gallop  , no rub, nl s1 s2                           - JVD- none , edema+2-3, stasis changes- none, varices- none           Lung- clear to P&A, wheeze- none, cough- none , dullness-none, rub- none           Chest wall- + postsurgical scars Abd-   + Gastric feeding tube Br/ Gen/ Rectal- Not done, not indicated Extrem- cyanosis- none, clubbing, none, atrophy- none, strength- nl, + brace L wrist Neuro- grossly intact to observation

## 2019-09-16 ENCOUNTER — Other Ambulatory Visit: Payer: Self-pay | Admitting: Internal Medicine

## 2019-09-17 ENCOUNTER — Other Ambulatory Visit: Payer: Self-pay | Admitting: Internal Medicine

## 2019-09-17 ENCOUNTER — Telehealth: Payer: Self-pay | Admitting: Internal Medicine

## 2019-09-17 NOTE — Telephone Encounter (Signed)
I called the pharmacy and they said the insurance said it is too soon to pick it up. She can get it Wednesday. I told Kristen [name] this and she is going to get an OTC product to help her out until Wednesday.

## 2019-09-17 NOTE — Telephone Encounter (Signed)
Pt is requesting refill on her Diphenoxylate 2.5 MG  Pt states [facility] states the medicine was denied.

## 2019-09-25 NOTE — Assessment & Plan Note (Signed)
Apparent;ly no recurrence. Left with dysphagia, feeding tube.

## 2019-09-25 NOTE — Assessment & Plan Note (Signed)
Followed by Cardiology

## 2019-09-25 NOTE — Assessment & Plan Note (Signed)
Considered idiopathic- never had MSLT. 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 May do better with a longer lasting agent. Plan- add adderall 20 mg XR, refill adderall 10 mg with discussion.

## 2019-09-27 ENCOUNTER — Encounter: Payer: Self-pay | Admitting: Specialist

## 2019-09-27 ENCOUNTER — Other Ambulatory Visit: Payer: Self-pay

## 2019-09-27 ENCOUNTER — Ambulatory Visit (INDEPENDENT_AMBULATORY_CARE_PROVIDER_SITE_OTHER): Payer: BC Managed Care – PPO | Admitting: Specialist

## 2019-09-27 VITALS — BP 118/80 | HR 76 | Ht 70.0 in | Wt 190.6 lb

## 2019-09-27 DIAGNOSIS — Z4889 Encounter for other specified surgical aftercare: Secondary | ICD-10-CM | POA: Diagnosis not present

## 2019-09-27 DIAGNOSIS — M960 Pseudarthrosis after fusion or arthrodesis: Secondary | ICD-10-CM

## 2019-09-27 DIAGNOSIS — M533 Sacrococcygeal disorders, not elsewhere classified: Secondary | ICD-10-CM

## 2019-09-27 DIAGNOSIS — M4726 Other spondylosis with radiculopathy, lumbar region: Secondary | ICD-10-CM

## 2019-09-27 DIAGNOSIS — M4316 Spondylolisthesis, lumbar region: Secondary | ICD-10-CM

## 2019-09-27 DIAGNOSIS — Z981 Arthrodesis status: Secondary | ICD-10-CM | POA: Diagnosis not present

## 2019-09-27 DIAGNOSIS — M4325 Fusion of spine, thoracolumbar region: Secondary | ICD-10-CM

## 2019-09-27 NOTE — Progress Notes (Signed)
Office Visit Note   Patient: Kristen [name]           Date of Birth: 26-Sep-1965           MRN: [id number] Visit Date: 09/27/2019              Requested by: [name], MD [place],  [place] [address] PCP: [name], MD   Assessment & Plan: Visit Diagnoses:  1. Spondylolisthesis of lumbar region   2. Encounter for other specified surgical aftercare   3. Pseudarthrosis after fusion or arthrodesis   4. S/P lumbar fusion   5. Other spondylosis with radiculopathy, lumbar region   6. Sacroiliac joint disease   7. Fusion of spine of thoracolumbar region     Plan: Avoid bending, stooping and avoid lifting weights greater than 10 lbs. Avoid prolong standing and walking. Avoid frequent bending and stooping  No lifting greater than 10 lbs. May use ice or moist heat for pain. Weight loss is of benefit. Handicap license is approved. Will order MRI of the lumbar spine due to increasing right anterior thigh and knee pain. Both diclofenac and gabapentin can cause fluid retension and swelling into the leg.  Follow-Up Instructions: No follow-ups on file.   Orders:  No orders of the defined types were placed in this encounter.  No orders of the defined types were placed in this encounter.     Procedures: No procedures performed   Clinical Data: No additional findings.   Subjective: Chief Complaint  Patient presents with  . Lower Back - Follow-up  . Right Leg - Edema, Follow-up  . Left Leg - Edema, Follow-up    54 year old female with history of L4 to L5 fusion with intermittant right sided leg pain numbness and weak. There is pain into the right leg. No bowel or bladder difficulties. She is limited in her standing and walking to short distances. Sitting everything  With become painful. She  Has decreased standing and walking and has had to stop driving long distances.    Review of Systems  Constitutional: Negative.   HENT: Negative.     Eyes: Negative.   Respiratory: Negative.   Cardiovascular: Negative.   Gastrointestinal: Negative.   Endocrine: Negative.   Genitourinary: Negative.   Musculoskeletal: Negative.   Skin: Negative.   Allergic/Immunologic: Negative.   Neurological: Negative.   Hematological: Negative.   Psychiatric/Behavioral: Negative.      Objective: Vital Signs: BP 118/80 (BP Location: Left Arm, Patient Position: Sitting)   Pulse 76   Ht 5' 10"  (1.778 m)   Wt 190 lb 9.6 oz (86.5 kg)   LMP 11/15/2013   BMI 27.35 kg/m   Physical Exam  Ortho Exam  Specialty Comments:  No specialty comments available.  Imaging: No results found.   PMFS History: Patient Active Problem List   Diagnosis Date Noted  . Spinal stenosis of lumbar region 01/20/2016    Priority: High    Class: Chronic  . DDD (degenerative disc disease), lumbar 01/20/2016    Priority: High    Class: Chronic  . Alcohol use 08/05/2019  . Bilateral lower extremity edema 08/05/2019  . Peripheral edema 03/12/2019  . Iron deficiency anemia 12/11/2018  . Loosening of hardware in spine ([place])   . Other spondylosis with radiculopathy, lumbar region   . History of lumbar spinal fusion 11/06/2018  . UTI (urinary tract infection) 05/15/2017  . 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  Malignant neoplasm of base of tongue ([place]) 10/29/2016  . 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[place]) 10/29/2016  . Headache associated with sexual activity 05/14/2016  . Tongue lesion 05/14/2016  . Thrombocytopenia ([place]) 01/22/2016  . Chronic diarrhea 01/22/2016  . Chest pain   . Essential hypertension   . Spinal stenosis of lumbar region with neurogenic claudication 01/20/2016  . Low back pain 12/22/2015  . Hypersomnolence 10/13/2015  . Muscle cramp 08/01/2015  . GERD (gastroesophageal reflux disease) 07/09/2015  . Morbid obesity with BMI of 40.0-44.9, adult ([place]) 01/08/2013   Past Medical History:  Diagnosis Date  .  Allergy   . Anemia    Iron deficiency  . Anxiety   . Arthritis    back, left shoulder  . Blood transfusion without reported diagnosis   . Chicken pox   . Depression   . Diarrhea    takes Imodium daily  . GERD (gastroesophageal reflux disease)   . H/O hiatal hernia   . Headache(784.0)   . History of kidney stones    1996ish  . History of radiation therapy 11/16/16- 01/01/17   Base of Tongue/ 66 gy in 33 fractions/ Dose: 2 Gy  . Hypertension   . Iron deficiency anemia 12/11/2018  . Migraine    none for 5 years (as of 01/13/16)  . OSA (obstructive sleep apnea) 10/13/2015   unable to get cpap, plans to get one in 2018  . Pneumonia   . Restless legs   . Scoliosis   . Shingles 08/28/2013  . Shortness of breath    with exertion  . Sleep apnea   . Tongue cancer ([place])    tongue cancer    Family History  Problem Relation Age of Onset  . Heart disease Father   . Heart attack Father   . Hypertension Mother   . Arthritis Mother   . Diabetes Maternal Grandmother   . Diabetes Paternal Grandmother   . Diabetes Maternal Uncle   . Colon polyps Neg Hx   . Colon cancer Neg Hx   . Esophageal cancer Neg Hx   . Stomach cancer Neg Hx   . Rectal cancer Neg Hx     Past Surgical History:  Procedure Laterality Date  . BACK SURGERY  07/21/1978  . CHOLECYSTECTOMY N/A 08/15/2013   Procedure: LAPAROSCOPIC CHOLECYSTECTOMY WITH INTRAOPERATIVE CHOLANGIOGRAM;  Surgeon: Gwenyth Ober, MD;  Location: [facility];  Service: General;  Laterality: N/A;  . COLONOSCOPY N/A 01/09/2013   Procedure: COLONOSCOPY;  Surgeon: Beryle Beams, MD;  Location: [facility];  Service: Endoscopy;  Laterality: N/A;  . COLONOSCOPY  03/2018  . DIRECT LARYNGOSCOPY  07/2016   Dr. Nicolette Bang [facility]  . ESOPHAGOGASTRODUODENOSCOPY  03/2018  . ESOPHAGOGASTRODUODENOSCOPY (EGD) WITH PROPOFOL N/A 04/24/2019   Procedure: ESOPHAGOGASTRODUODENOSCOPY (EGD) WITH PROPOFOL;  Surgeon: Gatha Mayer, MD;  [facility];  Service:  Endoscopy;  Laterality: N/A;  . GASTROSTOMY TUBE PLACEMENT  09/27/2016  . GIVENS CAPSULE STUDY N/A 04/24/2019   Procedure: GIVENS CAPSULE STUDY;  Surgeon: Gatha Mayer, MD;  [facility];  Service: Endoscopy;  Laterality: N/A;  . HERNIA REPAIR Left 1981  . IR PATIENT EVAL TECH 0-60 MINS  12/13/2016  . IR PATIENT EVAL TECH 0-60 MINS  04/11/2017  . IR PATIENT EVAL TECH 0-60 MINS  03/24/2018  . IR PATIENT EVAL TECH 0-60 MINS  11/23/2018  . IR PATIENT EVAL TECH 0-60 MINS  05/28/2019  . IR REPLACE G-TUBE SIMPLE WO FLUORO  12/12/2017  .  IR REPLACE G-TUBE SIMPLE WO FLUORO  03/29/2018  . IR REPLACE G-TUBE SIMPLE WO FLUORO  06/29/2018  . IR REPLACE G-TUBE SIMPLE WO FLUORO  11/02/2018  . IR REPLACE G-TUBE SIMPLE WO FLUORO  03/08/2019  . IR REPLACE G-TUBE SIMPLE WO FLUORO  07/02/2019  . MODIFIED RADICAL NECK DISSECTION Left 09/27/2016   Levels 1 & 2  . PARTIAL GLOSSECTOMY Left 09/27/2016   Left hemi partial glossectomy  . SPINE SURGERY  01/20/2016   fusion  . TONSILLECTOMY    . tracheotomy  09/27/2016  . TUBAL LIGATION  06/1988   Social History   Occupational History  . Occupation: Air cabin crew  Tobacco Use  . Smoking status: Never Smoker  . Smokeless tobacco: Never Used  Vaping Use  . Vaping Use: Never used  Substance and Sexual Activity  . Alcohol use: Yes    Alcohol/week: 7.0 standard drinks    Types: 7 Cans of beer per week  . Drug use: No  . Sexual activity: Yes    Birth control/protection: Surgical

## 2019-09-27 NOTE — Patient Instructions (Signed)
Avoid bending, stooping and avoid lifting weights greater than 10 lbs. Avoid prolong standing and walking. Avoid frequent bending and stooping  No lifting greater than 10 lbs. May use ice or moist heat for pain. Weight loss is of benefit. Handicap license is approved. Will order MRI of the lumbar spine due to increasing right anterior thigh and knee pain. Both diclofenac and gabapentin can cause fluid retension and swelling into the leg.

## 2019-09-29 ENCOUNTER — Encounter: Payer: Self-pay | Admitting: *Deleted

## 2019-10-12 ENCOUNTER — Other Ambulatory Visit: Payer: Self-pay | Admitting: Internal Medicine

## 2019-10-16 ENCOUNTER — Other Ambulatory Visit: Payer: Self-pay | Admitting: Specialist

## 2019-10-24 ENCOUNTER — Other Ambulatory Visit: Payer: BC Managed Care – PPO

## 2019-10-24 ENCOUNTER — Other Ambulatory Visit: Payer: Self-pay | Admitting: Internal Medicine

## 2019-10-24 MED ORDER — DIPHENOXYLATE-ATROPINE 2.5-0.025 MG PO TABS
ORAL_TABLET | ORAL | 3 refills | Status: DC
Start: 2019-10-24 — End: 2020-06-11

## 2019-11-06 ENCOUNTER — Other Ambulatory Visit: Payer: Self-pay | Admitting: Family Medicine

## 2019-11-08 ENCOUNTER — Ambulatory Visit: Payer: BC Managed Care – PPO | Admitting: Specialist

## 2019-11-12 ENCOUNTER — Other Ambulatory Visit: Payer: Self-pay | Admitting: Obstetrics & Gynecology

## 2019-11-12 ENCOUNTER — Other Ambulatory Visit: Payer: Self-pay | Admitting: Family Medicine

## 2019-11-12 DIAGNOSIS — Z1231 Encounter for screening mammogram for malignant neoplasm of breast: Secondary | ICD-10-CM

## 2019-11-15 ENCOUNTER — Other Ambulatory Visit: Payer: Self-pay | Admitting: Internal Medicine

## 2019-11-16 ENCOUNTER — Encounter: Payer: Self-pay | Admitting: Family Medicine

## 2019-11-16 NOTE — Telephone Encounter (Signed)
Refill x1 year please

## 2019-11-16 NOTE — Telephone Encounter (Signed)
Please advise Sir, thank you. 

## 2019-11-19 NOTE — Telephone Encounter (Signed)
Spoke with pt state that she wanted to get a referral for another OBGYN, Pt was advised that she does not require a referral, pt advised to call around OBGYN offices and check if they take pt insurance

## 2019-11-29 ENCOUNTER — Other Ambulatory Visit (HOSPITAL_COMMUNITY): Payer: BC Managed Care – PPO

## 2019-11-29 ENCOUNTER — Encounter (HOSPITAL_COMMUNITY): Payer: Self-pay

## 2019-11-30 ENCOUNTER — Ambulatory Visit (HOSPITAL_COMMUNITY)
Admission: RE | Admit: 2019-11-30 | Discharge: 2019-11-30 | Disposition: A | Discharge status: Home / Self Care | Payer: BC Managed Care – PPO | Source: Ambulatory Visit | Attending: Radiology | Admitting: Radiology

## 2019-11-30 ENCOUNTER — Other Ambulatory Visit: Payer: Self-pay

## 2019-11-30 DIAGNOSIS — R1310 Dysphagia, unspecified: Secondary | ICD-10-CM | POA: Diagnosis not present

## 2019-11-30 DIAGNOSIS — C01 Malignant neoplasm of base of tongue: Secondary | ICD-10-CM

## 2019-11-30 DIAGNOSIS — Z431 Encounter for attention to gastrostomy: Secondary | ICD-10-CM | POA: Insufficient documentation

## 2019-11-30 HISTORY — PX: IR REPLACE G-TUBE SIMPLE WO FLUORO: IMG2323

## 2019-11-30 NOTE — Procedures (Signed)
Patient seen for g-tube site replacement procedure performed in IR suite. Existing 18 Fr G tube balloon retention was deflated and the gastrostomy tube  removed using gentle traction.   New tube inserted with out difficulty 9 ml of normal saline instilled in the retention balloon port and the flange repositioned for comfort. Stomach contents noted in the g tube.   Ok to begin using g-tube for medicines, tube feeds, free water, etc.  Please call IR for any questions or concerns regarding the g-tube.

## 2019-12-07 ENCOUNTER — Ambulatory Visit
Admission: RE | Admit: 2019-12-07 | Discharge: 2019-12-07 | Disposition: A | Discharge status: Home / Self Care | Payer: BC Managed Care – PPO | Source: Ambulatory Visit | Attending: Specialist | Admitting: Specialist

## 2019-12-07 ENCOUNTER — Other Ambulatory Visit: Payer: Self-pay | Admitting: Specialist

## 2019-12-07 ENCOUNTER — Other Ambulatory Visit: Payer: Self-pay

## 2019-12-07 DIAGNOSIS — Z4889 Encounter for other specified surgical aftercare: Secondary | ICD-10-CM

## 2019-12-07 MED ORDER — GADOBENATE DIMEGLUMINE 529 MG/ML IV SOLN
15.0000 mL | Freq: Once | INTRAVENOUS | Status: AC | PRN
  Administered 2019-12-07: 15 mL via INTRAVENOUS

## 2019-12-07 NOTE — Telephone Encounter (Signed)
Tried Geographical information systems officer and was placed on hold for over 5 min. Will call back.

## 2019-12-11 NOTE — Telephone Encounter (Signed)
She is using both Adderall doses. Ok to start prir auth for the XR 20 mg.

## 2019-12-11 NOTE — Telephone Encounter (Signed)
Called [facility] and was told that the Adderall XR 20 mg needs a prior auth and was provided with number of [phone]   On patients med list there are 2 different doses, Dr. Annamaria Boots is to taking 10 mg and the XR 20 mg? Please advise and we will start PA if needed

## 2019-12-12 ENCOUNTER — Other Ambulatory Visit: Payer: Self-pay

## 2019-12-12 ENCOUNTER — Ambulatory Visit
Admission: RE | Admit: 2019-12-12 | Discharge: 2019-12-12 | Disposition: A | Discharge status: Home / Self Care | Payer: BC Managed Care – PPO | Source: Ambulatory Visit | Attending: Family Medicine | Admitting: Family Medicine

## 2019-12-12 DIAGNOSIS — Z1231 Encounter for screening mammogram for malignant neoplasm of breast: Secondary | ICD-10-CM

## 2019-12-14 NOTE — Telephone Encounter (Signed)
Called [facility] and initiated PA for Adderall XR 20 mg  PA number is [phone]

## 2019-12-20 ENCOUNTER — Encounter: Payer: Self-pay | Admitting: *Deleted

## 2019-12-20 ENCOUNTER — Other Ambulatory Visit (HOSPITAL_COMMUNITY): Payer: Self-pay | Admitting: *Deleted

## 2019-12-20 DIAGNOSIS — R1310 Dysphagia, unspecified: Secondary | ICD-10-CM

## 2019-12-20 NOTE — Telephone Encounter (Signed)
Dr. Annamaria Boots, please see mychart message sent by pt and advise:  To: [facility] POOL    From: BASILIA STUCKERT    Created: 12/18/2019 12:18 PM     *-*-*This message was handled on 12/20/2019 3:28 PM by Sofiya Ezelle P*-*-*  I have spoken to [facility] about the pre-authorization that you did for my medication Amphetamine-Dextroamphetamine ER 26m was denied because you did not provide enough information about why I needed the medication. [employer] stated that if you send another request for this medication and the reason that I needed it got. [employer] [place] would approved for me to get this medication I received a letter stating why they refused your request.

## 2019-12-20 NOTE — Telephone Encounter (Signed)
The extended release Adderall XR 20 mg was added at my 8/23 visit because she was having insufficient control of hypersomnolence using up to 30 mg daily of immediate release Adderall with daytime sleepiness and need for frequent naps.

## 2019-12-26 ENCOUNTER — Ambulatory Visit (HOSPITAL_COMMUNITY)
Admission: RE | Admit: 2019-12-26 | Discharge: 2019-12-26 | Disposition: A | Discharge status: Home / Self Care | Payer: BC Managed Care – PPO | Source: Ambulatory Visit | Attending: Otolaryngology | Admitting: Otolaryngology

## 2019-12-26 ENCOUNTER — Other Ambulatory Visit: Payer: Self-pay

## 2019-12-26 DIAGNOSIS — R1310 Dysphagia, unspecified: Secondary | ICD-10-CM | POA: Diagnosis not present

## 2019-12-26 DIAGNOSIS — R1312 Dysphagia, oropharyngeal phase: Secondary | ICD-10-CM | POA: Insufficient documentation

## 2019-12-26 NOTE — Progress Notes (Signed)
Modified Barium Swallow Progress Note  Patient Details  Name: Kristen [name] MRN: [id number] Date of Birth: 11-06-1965  Today's Date: 12/26/2019  Modified Barium Swallow completed.  Full report located under Chart Review in the Imaging Section.  Brief recommendations include the following:  Clinical Impression  Pt presents with a mild oropharyngeal dysphagia for which she compensates well. Given her previous cancer txs (partial glossectomy and XRT) she has reduced lingual propulsion, base of tonge retraction, and anterior hyolaryngeal movement. She takes very small bites/sips at a time, even when cued to challenge herself with bigger volumes. Her bolus cohesion is reduced and posterior transit seems more passive particularly with thin liquids. She maintains good airway protection across consistencies, with only trace amounts of penetration during the swallow with thin liquids that clears upon completion of the swallow, which is not considered abnormal. She has mild-moderate vallecular residue with solids but she asks for a liquid was with Mod I to clear the majority of this. She really did not want much solid food, as she mostly eats pureed foods at home. Recommend continuing baseline diet as tolerated. Pt believes that a lot of her more acute difficulties are because of the pain she has from her thrush, but also shares that she could not tolerate trying the mouth rinse. She is instead using a very hot washcloth to wipe her tongue. This is also in lieu of her normal oral care regimen. Recommend that she focus more heavily on oral care. Education was provided and pt was also given swabs to try with the mouth rinse. Encouraged her to stay in touch with MD if her thrush is not improving.    Swallow Evaluation Recommendations       SLP Diet Recommendations: Dysphagia 3 (Mech soft) solids;Thin liquid   Liquid Administration via: Straw;Cup (pt prefers straw)   Medication Administration: Via  alternative means (pt takes them via PEG)   Supervision: Patient able to self feed   Compensations: Slow rate;Small sips/bites;Multiple dry swallows after each bite/sip   Postural Changes: Seated upright at 90 degrees   Oral Care Recommendations: Oral care QID        Osie Bond., M.A. [place] Pager [phone] Office [phone]  12/26/2019,12:53 PM

## 2019-12-27 ENCOUNTER — Ambulatory Visit (INDEPENDENT_AMBULATORY_CARE_PROVIDER_SITE_OTHER): Payer: BC Managed Care – PPO | Admitting: Specialist

## 2019-12-27 ENCOUNTER — Other Ambulatory Visit (HOSPITAL_COMMUNITY): Payer: Self-pay | Admitting: Radiology

## 2019-12-27 ENCOUNTER — Ambulatory Visit (HOSPITAL_COMMUNITY)
Admission: RE | Admit: 2019-12-27 | Discharge: 2019-12-27 | Disposition: A | Discharge status: Home / Self Care | Payer: BC Managed Care – PPO | Source: Ambulatory Visit | Attending: Radiology | Admitting: Radiology

## 2019-12-27 VITALS — BP 112/74 | HR 80 | Ht 69.0 in | Wt 187.0 lb

## 2019-12-27 DIAGNOSIS — M960 Pseudarthrosis after fusion or arthrodesis: Secondary | ICD-10-CM

## 2019-12-27 DIAGNOSIS — M533 Sacrococcygeal disorders, not elsewhere classified: Secondary | ICD-10-CM | POA: Diagnosis not present

## 2019-12-27 DIAGNOSIS — M4316 Spondylolisthesis, lumbar region: Secondary | ICD-10-CM | POA: Diagnosis not present

## 2019-12-27 DIAGNOSIS — K9429 Other complications of gastrostomy: Secondary | ICD-10-CM | POA: Insufficient documentation

## 2019-12-27 DIAGNOSIS — R1310 Dysphagia, unspecified: Secondary | ICD-10-CM | POA: Insufficient documentation

## 2019-12-27 DIAGNOSIS — M4807 Spinal stenosis, lumbosacral region: Secondary | ICD-10-CM

## 2019-12-27 DIAGNOSIS — G96198 Other disorders of meninges, not elsewhere classified: Secondary | ICD-10-CM

## 2019-12-27 DIAGNOSIS — Z981 Arthrodesis status: Secondary | ICD-10-CM | POA: Diagnosis not present

## 2019-12-27 DIAGNOSIS — R633 Feeding difficulties, unspecified: Secondary | ICD-10-CM | POA: Insufficient documentation

## 2019-12-27 HISTORY — PX: IR REPLACE G-TUBE SIMPLE WO FLUORO: IMG2323

## 2019-12-27 MED ORDER — LIDOCAINE VISCOUS HCL 2 % MT SOLN
OROMUCOSAL | Status: AC
  Administered 2019-12-27: 3 mL
  Filled 2019-12-27: qty 15

## 2019-12-27 MED ORDER — SILVER NITRATE-POT NITRATE 75-25 % EX MISC
CUTANEOUS | Status: AC
  Filled 2019-12-27: qty 20

## 2019-12-27 MED ORDER — PREGABALIN 75 MG PO CAPS
75.0000 mg | ORAL_CAPSULE | Freq: Two times a day (BID) | ORAL | 0 refills | Status: DC
Start: 2019-12-27 — End: 2020-01-30

## 2019-12-27 MED ORDER — TRAMADOL HCL 50 MG PO TABS: 50.0000 mg | ORAL_TABLET | Freq: Four times a day (QID) | ORAL | 0 refills | Status: DC | PRN

## 2019-12-27 MED ORDER — SILVER NITRATE-POT NITRATE 75-25 % EX MISC
CUTANEOUS | Status: AC
  Filled 2019-12-27: qty 10

## 2019-12-27 NOTE — Progress Notes (Signed)
Office Visit Note   Patient: Kristen [name]           Date of Birth: 1965/02/28           MRN: [id number] Visit Date: 12/27/2019              Requested by: [name], MD [place],  [place] [address] PCP: [name], MD   Assessment & Plan: Visit Diagnoses:  1. Pseudarthrosis after fusion or arthrodesis   2. Sacroiliac joint disease   3. Spondylolisthesis of lumbar region   4. S/P lumbar fusion   5. Spinal stenosis of lumbosacral region   6. Epidural fibrosis    Plan:Avoid bending, stooping and avoid lifting weights greater than 10 lbs. Avoid prolong standing and walking. Avoid frequent bending and stooping  No lifting greater than 10 lbs. May use ice or moist heat for pain. Weight loss is of benefit. Handicap license is approved. Dr. Romona Curls secretary/Assistant will call to arrange for epidural steroid injection right TF L4-5 Tramadol for pain. Switch from gabapentin to lyrica by stopping PM dose of gabepentin and start the lyrica then after 3-4 days stop the AM and afternoon gabapentin and start lyrica in AM  And PM.  Follow-Up Instructions: No follow-ups on file.   Orders:  No orders of the defined types were placed in this encounter.  No orders of the defined types were placed in this encounter.     Procedures: No procedures performed   Clinical Data: No additional findings.   Subjective: Chief Complaint  Patient presents with  . Lower Back - Follow-up    MRI Review    HPI  Review of Systems   Objective: Vital Signs: BP 112/74 (BP Location: Left Arm, Patient Position: Sitting)   Pulse 80   Ht 5' 9"  (1.753 m)   Wt 187 lb (84.8 kg)   LMP 11/15/2013   BMI 27.62 kg/m   Physical Exam  Ortho Exam  Specialty Comments:  No specialty comments available.  Imaging: IR REPLACE G-TUBE SIMPLE WO FLUORO  Result Date: 12/27/2019 INDICATION: Patient with history of tongue cancer, dysphagia and chronic gastrostomy  tube; tube recently dislodged and request received for replacement EXAM: GASTROSTOMY TUBE REPLACEMENT MEDICATIONS: Viscous lidocaine into gastrostomy tube insertion site tract ANESTHESIA/SEDATION: None CONTRAST:  None FLUOROSCOPY TIME:  None COMPLICATIONS: None immediate. PROCEDURE: Informed consent was obtained from the patient after a thorough discussion of the procedural risks, benefits and alternatives. All questions were addressed. A timeout was performed prior to the initiation of the procedure. The pre-existing 30 French balloon retention gastrostomy tube had been displaced secondary to balloon malfunction/rupture. Viscous lidocaine was applied around insertion site of gastrostomy tube. A new 18 French balloon retention gastrostomy tube was inserted and 9 cc of saline was instilled into balloon. G-tube was secured to skin site, flushed and aspirated. Silver nitrate was also applied to symptomatic granulation tissue at insertion site. Gauze dressing applied over tube afterwards. No immediate complications. EBL - none. IMPRESSION: Successful replacement of 18 French balloon retention gastrostomy tube as discussed above. Read by: Rowe Robert, PA-C Electronically Signed   By: Sandi Mariscal M.D.   On: 12/27/2019 11:38     PMFS History: Patient Active Problem List   Diagnosis Date Noted  . Spinal stenosis of lumbar region 01/20/2016    Priority: High    Class: Chronic  . DDD (degenerative disc disease), lumbar 01/20/2016    Priority: High    Class: Chronic  .  Alcohol use 08/05/2019  . Bilateral lower extremity edema 08/05/2019  . Peripheral edema 03/12/2019  . Iron deficiency anemia 12/11/2018  . Loosening of hardware in spine ([place])   . Other spondylosis with radiculopathy, lumbar region   . History of lumbar spinal fusion 11/06/2018  . UTI (urinary tract infection) 05/15/2017  . 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 Malignant neoplasm of base of tongue ([place]) 10/29/2016   . 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[place]) 10/29/2016  . Headache associated with sexual activity 05/14/2016  . Tongue lesion 05/14/2016  . Thrombocytopenia ([place]) 01/22/2016  . Chronic diarrhea 01/22/2016  . Chest pain   . Essential hypertension   . Spinal stenosis of lumbar region with neurogenic claudication 01/20/2016  . Low back pain 12/22/2015  . Hypersomnolence 10/13/2015  . Muscle cramp 08/01/2015  . GERD (gastroesophageal reflux disease) 07/09/2015  . Morbid obesity with BMI of 40.0-44.9, adult ([place]) 01/08/2013   Past Medical History:  Diagnosis Date  . Allergy   . Anemia    Iron deficiency  . Anxiety   . Arthritis    back, left shoulder  . Blood transfusion without reported diagnosis   . Chicken pox   . Depression   . Diarrhea    takes Imodium daily  . GERD (gastroesophageal reflux disease)   . H/O hiatal hernia   . Headache(784.0)   . History of kidney stones    1996ish  . History of radiation therapy 11/16/16- 01/01/17   Base of Tongue/ 66 gy in 33 fractions/ Dose: 2 Gy  . Hypertension   . Iron deficiency anemia 12/11/2018  . Migraine    none for 5 years (as of 01/13/16)  . OSA (obstructive sleep apnea) 10/13/2015   unable to get cpap, plans to get one in 2018  . Pneumonia   . Restless legs   . Scoliosis   . Shingles 08/28/2013  . Shortness of breath    with exertion  . Sleep apnea   . Tongue cancer ([place])    tongue cancer    Family History  Problem Relation Age of Onset  . Heart disease Father   . Heart attack Father   . Hypertension Mother   . Arthritis Mother   . Diabetes Maternal Grandmother   . Diabetes Paternal Grandmother   . Diabetes Maternal Uncle   . Colon polyps Neg Hx   . Colon cancer Neg Hx   . Esophageal cancer Neg Hx   . Stomach cancer Neg Hx   . Rectal cancer Neg Hx     Past Surgical History:  Procedure Laterality Date  . BACK SURGERY  07/21/1978  . CHOLECYSTECTOMY N/A 08/15/2013   Procedure: LAPAROSCOPIC CHOLECYSTECTOMY  WITH INTRAOPERATIVE CHOLANGIOGRAM;  Surgeon: Gwenyth Ober, MD;  Location: [facility];  Service: General;  Laterality: N/A;  . COLONOSCOPY N/A 01/09/2013   Procedure: COLONOSCOPY;  Surgeon: Beryle Beams, MD;  Location: [facility];  Service: Endoscopy;  Laterality: N/A;  . COLONOSCOPY  03/2018  . DIRECT LARYNGOSCOPY  07/2016   Dr. Nicolette Bang [facility]  . ESOPHAGOGASTRODUODENOSCOPY  03/2018  . ESOPHAGOGASTRODUODENOSCOPY (EGD) WITH PROPOFOL N/A 04/24/2019   Procedure: ESOPHAGOGASTRODUODENOSCOPY (EGD) WITH PROPOFOL;  Surgeon: Gatha Mayer, MD;  [facility];  Service: Endoscopy;  Laterality: N/A;  . GASTROSTOMY TUBE PLACEMENT  09/27/2016  . GIVENS CAPSULE STUDY N/A 04/24/2019   Procedure: GIVENS CAPSULE STUDY;  Surgeon: Gatha Mayer, MD;  [facility];  Service: Endoscopy;  Laterality: N/A;  .  HERNIA REPAIR Left 1981  . IR PATIENT EVAL TECH 0-60 MINS  12/13/2016  . IR PATIENT EVAL TECH 0-60 MINS  04/11/2017  . IR PATIENT EVAL TECH 0-60 MINS  03/24/2018  . IR PATIENT EVAL TECH 0-60 MINS  11/23/2018  . IR PATIENT EVAL TECH 0-60 MINS  05/28/2019  . IR REPLACE G-TUBE SIMPLE WO FLUORO  12/12/2017  . IR REPLACE G-TUBE SIMPLE WO FLUORO  03/29/2018  . IR REPLACE G-TUBE SIMPLE WO FLUORO  06/29/2018  . IR REPLACE G-TUBE SIMPLE WO FLUORO  11/02/2018  . IR REPLACE G-TUBE SIMPLE WO FLUORO  03/08/2019  . IR REPLACE G-TUBE SIMPLE WO FLUORO  07/02/2019  . IR REPLACE G-TUBE SIMPLE WO FLUORO  11/30/2019  . IR REPLACE G-TUBE SIMPLE WO FLUORO  12/27/2019  . MODIFIED RADICAL NECK DISSECTION Left 09/27/2016   Levels 1 & 2  . PARTIAL GLOSSECTOMY Left 09/27/2016   Left hemi partial glossectomy  . SPINE SURGERY  01/20/2016   fusion  . TONSILLECTOMY    . tracheotomy  09/27/2016  . TUBAL LIGATION  06/1988   Social History   Occupational History  . Occupation: Air cabin crew  Tobacco Use  . Smoking status: Never Smoker  . Smokeless tobacco: Never Used  Vaping Use  . Vaping Use: Never used   Substance and Sexual Activity  . Alcohol use: Yes    Alcohol/week: 7.0 standard drinks    Types: 7 Cans of beer per week  . Drug use: No  . Sexual activity: Yes    Birth control/protection: Surgical

## 2019-12-27 NOTE — Patient Instructions (Signed)
Avoid bending, stooping and avoid lifting weights greater than 10 lbs. Avoid prolong standing and walking. Avoid frequent bending and stooping  No lifting greater than 10 lbs. May use ice or moist heat for pain. Weight loss is of benefit. Handicap license is approved. Dr. Romona Curls secretary/Assistant will call to arrange for epidural steroid injection right TF L4-5 Tramadol for pain. Switch from gabapentin to lyrica by stopping PM dose of gabepentin and start the lyrica then after 3-4 days stop the AM and afternoon gabapentin and start lyrica in AM  And PM.

## 2019-12-27 NOTE — Procedures (Signed)
57 French balloon retention gastrostomy tube was replaced, 9 cc saline instilled into the balloon, aspirated and flushed and secured to skin site.  No immediate complications.  EBL none.  Medication used-viscous lidocaine into gastrostomy tube insertion site.  Silver nitrate also applied to symptomatic granulation tissue at tube insertion site.

## 2020-01-02 ENCOUNTER — Telehealth: Payer: Self-pay | Admitting: Physical Medicine and Rehabilitation

## 2020-01-02 NOTE — Telephone Encounter (Signed)
Pt called stating she would like to get scheduled for an appt  [phone]

## 2020-01-02 NOTE — Telephone Encounter (Signed)
Called pt back and sch 01/29/20

## 2020-01-04 ENCOUNTER — Other Ambulatory Visit: Payer: Self-pay | Admitting: Specialist

## 2020-01-14 ENCOUNTER — Other Ambulatory Visit: Payer: Self-pay | Admitting: Specialist

## 2020-01-16 ENCOUNTER — Telehealth: Payer: Self-pay | Admitting: Specialist

## 2020-01-16 NOTE — Telephone Encounter (Signed)
Melissa from [facility] called. Says her insurance will not cover the DX Code used for her stocking. Would like a different dx code for her stocking. Call back number is [phone]

## 2020-01-16 NOTE — Telephone Encounter (Signed)
I tried to call but it was giving a message "your call cannot be completed at this time, please try again later". I faxed the requested Dx code to [phone].

## 2020-01-27 ENCOUNTER — Other Ambulatory Visit (INDEPENDENT_AMBULATORY_CARE_PROVIDER_SITE_OTHER): Payer: Self-pay | Admitting: Specialist

## 2020-01-29 ENCOUNTER — Encounter: Payer: Self-pay | Admitting: Physical Medicine and Rehabilitation

## 2020-01-29 ENCOUNTER — Ambulatory Visit (INDEPENDENT_AMBULATORY_CARE_PROVIDER_SITE_OTHER): Payer: BC Managed Care – PPO | Admitting: Physical Medicine and Rehabilitation

## 2020-01-29 ENCOUNTER — Other Ambulatory Visit: Payer: Self-pay

## 2020-01-29 ENCOUNTER — Ambulatory Visit: Payer: Self-pay

## 2020-01-29 VITALS — BP 128/85 | HR 81

## 2020-01-29 DIAGNOSIS — M5416 Radiculopathy, lumbar region: Secondary | ICD-10-CM

## 2020-01-29 MED ORDER — DEXAMETHASONE SODIUM PHOSPHATE 10 MG/ML IJ SOLN
15.0000 mg | Freq: Once | INTRAMUSCULAR | Status: AC
  Administered 2020-01-29: 15 mg

## 2020-01-29 NOTE — Procedures (Signed)
Lumbosacral Transforaminal Epidural Steroid Injection - Sub-Pedicular Approach with Fluoroscopic Guidance  Patient: Kristen [name]      Date of Birth: 1965/08/23 MRN: [id number] PCP: [name], MD      Visit Date: 01/29/2020   Universal Protocol:    Date/Time: 01/29/2020  Consent Given By: the patient  Position: PRONE  Additional Comments: Vital signs were monitored before and after the procedure. Patient was prepped and draped in the usual sterile fashion. The correct patient, procedure, and site was verified.   Injection Procedure Details:   Procedure diagnoses: Lumbar radiculopathy [M54.16]    Meds Administered:  Meds ordered this encounter  Medications  . dexamethasone (DECADRON) injection 15 mg    Laterality: Right  Location/Site:  L3-L4  Needle:5.0 in., 22 ga.  Short bevel or Quincke spinal needle  Needle Placement: Transforaminal  Findings:    -Comments: Excellent flow of contrast along the nerve, nerve root and into the epidural space.  Procedure Details: After squaring off the end-plates to get a true AP view, the C-arm was positioned so that an oblique view of the foramen as noted above was visualized. The target area is just inferior to the "nose of the scotty dog" or sub pedicular. The soft tissues overlying this structure were infiltrated with 2-3 ml. of 1% Lidocaine without Epinephrine.  The spinal needle was inserted toward the target using a "trajectory" view along the fluoroscope beam.  Under AP and lateral visualization, the needle was advanced so it did not puncture dura and was located close the 6 O'Clock position of the pedical in AP tracterory. Biplanar projections were used to confirm position. Aspiration was confirmed to be negative for CSF and/or blood. A 1-2 ml. volume of Isovue-250 was injected and flow of contrast was noted at each level. Radiographs were obtained for documentation purposes.   After attaining the desired flow of  contrast documented above, a 0.5 to 1.0 ml test dose of 0.25% Marcaine was injected into each respective transforaminal space.  The patient was observed for 90 seconds post injection.  After no sensory deficits were reported, and normal lower extremity motor function was noted,   the above injectate was administered so that equal amounts of the injectate were placed at each foramen (level) into the transforaminal epidural space.   Additional Comments:  The patient tolerated the procedure well Dressing: 2 x 2 sterile gauze and Band-Aid    Post-procedure details: Patient was observed during the procedure. Post-procedure instructions were reviewed.  Patient left the clinic in stable condition.

## 2020-01-29 NOTE — Patient Instructions (Signed)

## 2020-01-29 NOTE — Progress Notes (Signed)
Pt state lower back pain that traveles down her right leg. Pt state standing, walking and sitting for a long time makes the pain worse. Pt state she has to lay down and take pain meds to help ease the pain.  Numeric Pain Rating Scale and Functional Assessment Average Pain 2   In the last MONTH (on 0-10 scale) has pain interfered with the following?  1. General activity like being  able to carry out your everyday physical activities such as walking, climbing stairs, carrying groceries, or moving a chair?  Rating(10)   +Driver, -BT, -Dye Allergies.

## 2020-01-29 NOTE — Progress Notes (Signed)
KANASIA GAYMAN - 55 y.o. female MRN [id number]  Date of birth: 12-18-1965  Office Visit Note: Visit Date: 01/29/2020 PCP: [name], MD Referred by: [name], MD  Subjective: Chief Complaint  Patient presents with  . Lower Back - Pain  . Right Leg - Pain   HPI:  JENESIS MARTIN is a 55 y.o. female who comes in today at the request of Dr. Basil Dess for planned Right L3-L4 Lumbar epidural steroid injection with fluoroscopic guidance.  The patient has failed conservative care including home exercise, medications, time and activity modification.  This injection will be diagnostic and hopefully therapeutic.  Please see requesting physician notes for further details and justification.   ROS Otherwise per HPI.  Assessment & Plan: Visit Diagnoses:    ICD-10-CM   1. Lumbar radiculopathy  M54.16 XR C-ARM NO REPORT    Epidural Steroid injection    dexamethasone (DECADRON) injection 15 mg    Plan: No additional findings.   Meds & Orders:  Meds ordered this encounter  Medications  . dexamethasone (DECADRON) injection 15 mg    Orders Placed This Encounter  Procedures  . XR C-ARM NO REPORT  . Epidural Steroid injection    Follow-up: Return for Basil Dess, MD as directed.   Procedures: No procedures performed  Lumbosacral Transforaminal Epidural Steroid Injection - Sub-Pedicular Approach with Fluoroscopic Guidance  Patient: NOE PITTSLEY      Date of Birth: 06-20-1965 MRN: [id number] PCP: [name], MD      Visit Date: 01/29/2020   Universal Protocol:    Date/Time: 01/29/2020  Consent Given By: the patient  Position: PRONE  Additional Comments: Vital signs were monitored before and after the procedure. Patient was prepped and draped in the usual sterile fashion. The correct patient, procedure, and site was verified.   Injection Procedure Details:   Procedure diagnoses: Lumbar radiculopathy [M54.16]    Meds Administered:  Meds  ordered this encounter  Medications  . dexamethasone (DECADRON) injection 15 mg    Laterality: Right  Location/Site:  L3-L4  Needle:5.0 in., 22 ga.  Short bevel or Quincke spinal needle  Needle Placement: Transforaminal  Findings:    -Comments: Excellent flow of contrast along the nerve, nerve root and into the epidural space.  Procedure Details: After squaring off the end-plates to get a true AP view, the C-arm was positioned so that an oblique view of the foramen as noted above was visualized. The target area is just inferior to the "nose of the scotty dog" or sub pedicular. The soft tissues overlying this structure were infiltrated with 2-3 ml. of 1% Lidocaine without Epinephrine.  The spinal needle was inserted toward the target using a "trajectory" view along the fluoroscope beam.  Under AP and lateral visualization, the needle was advanced so it did not puncture dura and was located close the 6 O'Clock position of the pedical in AP tracterory. Biplanar projections were used to confirm position. Aspiration was confirmed to be negative for CSF and/or blood. A 1-2 ml. volume of Isovue-250 was injected and flow of contrast was noted at each level. Radiographs were obtained for documentation purposes.   After attaining the desired flow of contrast documented above, a 0.5 to 1.0 ml test dose of 0.25% Marcaine was injected into each respective transforaminal space.  The patient was observed for 90 seconds post injection.  After no sensory deficits were reported, and normal lower extremity motor function was noted,   the above injectate  was administered so that equal amounts of the injectate were placed at each foramen (level) into the transforaminal epidural space.   Additional Comments:  The patient tolerated the procedure well Dressing: 2 x 2 sterile gauze and Band-Aid    Post-procedure details: Patient was observed during the procedure. Post-procedure instructions were  reviewed.  Patient left the clinic in stable condition.      Clinical History: MRI LUMBAR SPINE WITHOUT AND WITH CONTRAST  TECHNIQUE: Multiplanar and multiecho pulse sequences of the lumbar spine were obtained without and with intravenous contrast.  CONTRAST:  27m MULTIHANCE GADOBENATE DIMEGLUMINE 529 MG/ML IV SOLN  COMPARISON:  Lumbar MRI 08/31/2018. Lumbar radiographs 02/05/2019 and earlier.  FINDINGS: Segmentation: Same numbering system as on the MRI last year designating partial sacralization of L5 on the left.  Alignment: Moderate levoconvex thoracolumbar scoliosis with associated chronic straightening of cervical lordosis. Mild chronic retrolisthesis of L3 on L4.  Vertebrae: Hardware susceptibility artifact related to long segment right side posterior spinal rod from the thoracic to the mid lumbar spine, superimposed posterior and interbody fusion hardware from L4 to S1.  No convincing marrow edema or acute osseous abnormality. Background bone marrow signal within normal limits. Visible sacrum and SI joints appear intact.  Conus medullaris and cauda equina: Conus extends to the T12-L1 level. No lower spinal cord or conus signal abnormality. Normal cauda equina nerve roots. No abnormal intradural enhancement or definite dural thickening.  Paraspinal and other soft tissues: Chronic postoperative changes to the paraspinal soft tissues. Stable visible abdominal viscera, including chronic renal cysts and/or renal collecting system enlargement.  Disc levels:  Chronic posterior element fusion from the visible lower thoracic spine probably through the L3 level as demonstrated on CT last year. Capacious spinal canal through the mid L3 level, no stenosis.  L3-L4: Prior decompression and fusion. Residual endplate spurring and posterior element hypertrophy. No spinal stenosis. Borderline to mild left lateral recess stenosis appears stable from last  year. Mild to moderate left and mild right L3 foraminal stenosis. This level appears stable.  L4-L5: Prior decompression and fusion. Residual endplate and posterior element hypertrophy mostly on the left. No spinal or lateral recess stenosis. Mild to moderate left but no convincing right L4 foraminal stenosis. This level is stable.  L5-S1: Decompression and fusion at this level is new since the MRI last year. Residual endplate spurring mostly on the left. There is no spinal stenosis. There is mild new architectural distortion at the right lateral recess, right S1 nerve level (series 6, image 34), but no convincing lateral recess stenosis. Moderate left L5 foraminal stenosis appears stable. No right foraminal stenosis.  IMPRESSION: 1. Decompression and fusion at L5-S1 since the MRI last year with no spinal stenosis. Mild new architectural distortion at the right lateral recess, but no convincing lateral recess stenosis. Stable moderate left L5 foraminal stenosis.  2. Stable prior L3-L4 and L4-L5 decompression and fusion with up to moderate residual left foraminal stenosis at both levels, but only mild suspected right L3 foraminal stenosis which appears stable, no convincing right L4 foraminal stenosis.  3. Underlying chronic levoconvex thoracolumbar scoliosis with posterior spinal rod and solid posterior element fusion from the lower thoracic spine through L3.   Electronically Signed   By: HGenevie AnnM.D.   On: 12/08/2019 08:45     Objective:  VS:  HT:    WT:   BMI:     BP:128/85  HR:81bpm  TEMP: ( )  RESP:  Physical Exam Vitals and nursing  note reviewed.  Constitutional:      General: She is not in acute distress.    Appearance: Normal appearance. She is obese. She is not ill-appearing.  HENT:     Head: Normocephalic and atraumatic.     Right Ear: External ear normal.     Left Ear: External ear normal.  Eyes:     Extraocular Movements: Extraocular movements  intact.  Cardiovascular:     Rate and Rhythm: Normal rate.     Pulses: Normal pulses.  Pulmonary:     Effort: Pulmonary effort is normal. No respiratory distress.  Abdominal:     General: There is no distension.     Palpations: Abdomen is soft.  Musculoskeletal:        General: Tenderness present.     Cervical back: Neck supple.     Right lower leg: No edema.     Left lower leg: No edema.     Comments: Patient has good distal strength with no pain over the greater trochanters.  No clonus or focal weakness.  Skin:    Findings: No erythema, lesion or rash.  Neurological:     General: No focal deficit present.     Mental Status: She is alert and oriented to person, place, and time.     Sensory: No sensory deficit.     Motor: No weakness or abnormal muscle tone.     Coordination: Coordination normal.  Psychiatric:        Mood and Affect: Mood normal.        Behavior: Behavior normal.      Imaging: No results found.

## 2020-01-30 ENCOUNTER — Ambulatory Visit: Payer: BC Managed Care – PPO | Admitting: Specialist

## 2020-01-30 ENCOUNTER — Other Ambulatory Visit: Payer: Self-pay | Admitting: Radiology

## 2020-01-30 MED ORDER — PREGABALIN 75 MG PO CAPS: 75.0000 mg | ORAL_CAPSULE | Freq: Two times a day (BID) | ORAL | 0 refills | Status: DC

## 2020-02-01 ENCOUNTER — Other Ambulatory Visit: Payer: Self-pay | Admitting: Family Medicine

## 2020-02-01 NOTE — Telephone Encounter (Signed)
Pt LOV was on 07/30/2019 and last refill was done on 11/06/2019 for 270 tablets with no refill, ok to send refill

## 2020-02-07 ENCOUNTER — Encounter: Payer: Self-pay | Admitting: Physical Medicine and Rehabilitation

## 2020-02-20 ENCOUNTER — Ambulatory Visit (INDEPENDENT_AMBULATORY_CARE_PROVIDER_SITE_OTHER): Payer: BC Managed Care – PPO | Admitting: Specialist

## 2020-02-20 ENCOUNTER — Encounter: Payer: Self-pay | Admitting: Specialist

## 2020-02-20 ENCOUNTER — Other Ambulatory Visit: Payer: Self-pay

## 2020-02-20 VITALS — BP 129/76 | HR 86 | Ht 69.0 in | Wt 187.0 lb

## 2020-02-20 DIAGNOSIS — Z4889 Encounter for other specified surgical aftercare: Secondary | ICD-10-CM

## 2020-02-20 DIAGNOSIS — M5416 Radiculopathy, lumbar region: Secondary | ICD-10-CM

## 2020-02-20 DIAGNOSIS — M4807 Spinal stenosis, lumbosacral region: Secondary | ICD-10-CM

## 2020-02-20 DIAGNOSIS — Z981 Arthrodesis status: Secondary | ICD-10-CM

## 2020-02-20 NOTE — Progress Notes (Signed)
Office Visit Note   Patient: Kristen [name]           Date of Birth: [birth year]           MRN: [id number] Visit Date: 02/20/2020              Requested by: [name], MD [place],  [place] [address] PCP: [name], MD   Assessment & Plan: Visit Diagnoses:  1. S/P lumbar fusion   2. Radiculopathy, lumbar region   3. Encounter for other specified surgical aftercare   4. Spinal stenosis of lumbosacral region     Plan: Follow-Up Instructions: Return in about 3 weeks (around 03/12/2020) for with dr Louanne Skye to review lumbar MRI     Orders:  Orders Placed This Encounter  Procedures   Ambulatory referral to Physical Medicine Rehab   No orders of the defined types were placed in this encounter.     Procedures: No procedures performed   Clinical Data: No additional findings.   Subjective: Chief Complaint  Patient presents with   Lower Back - Follow-up    Had a right L3-4 TF injection on 01/29/20    Patient seen by Benjiman Core PA-C, he noted that she had MRI of the lumbar spine and recommended she follow up with Dr. Louanne Skye in 2 weeks to go over the MRI.   Review of Systems  Constitutional: Negative.   HENT: Negative.    Eyes: Negative.   Respiratory: Negative.    Cardiovascular: Negative.   Gastrointestinal: Negative.   Endocrine: Negative.   Genitourinary: Negative.   Musculoskeletal: Negative.   Skin: Negative.   Allergic/Immunologic: Negative.   Neurological: Negative.   Hematological: Negative.   Psychiatric/Behavioral: Negative.      Objective: Vital Signs: BP 129/76 (BP Location: Right Arm, Patient Position: Sitting)   Pulse 86   Ht 5' 9"  (1.753 m)   Wt 187 lb (84.8 kg)   LMP 11/15/2013   BMI 27.62 kg/m   Physical Exam Constitutional:      Appearance: She is well-developed.  HENT:     Head: Normocephalic and atraumatic.  Eyes:     Pupils: Pupils are equal, round, and reactive to light.  Pulmonary:      Effort: Pulmonary effort is normal.     Breath sounds: Normal breath sounds.  Abdominal:     General: Bowel sounds are normal.     Palpations: Abdomen is soft.  Musculoskeletal:     Cervical back: Normal range of motion and neck supple.     Lumbar back: Negative right straight leg raise test and negative left straight leg raise test.  Skin:    General: Skin is warm and dry.  Neurological:     Mental Status: She is alert and oriented to person, place, and time.  Psychiatric:        Behavior: Behavior normal.        Thought Content: Thought content normal.        Judgment: Judgment normal.   Back Exam   Tenderness  The patient is experiencing tenderness in the lumbar.  Range of Motion  Extension:  abnormal  Flexion:  abnormal  Lateral bend right:  abnormal  Lateral bend left:  abnormal  Rotation right:  abnormal  Rotation left:  abnormal   Muscle Strength  Right Quadriceps:  5/5  Left Quadriceps:  5/5  Right Hamstrings:  5/5  Left Hamstrings:  5/5   Tests  Straight leg raise  right: negative Straight leg raise left: negative  Reflexes  Patellar:  0/4 Achilles:  0/4  Other  Toe walk: normal Heel walk: normal Sensation: normal Gait: normal   Comments:  Lumbar tenderness, no focal motor deficit MRI to be discussed with Dr. Louanne Skye    Specialty Comments:  No specialty comments available.  Imaging: No results found.   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   Malignant neoplasm of base of tongue ([place]) 10/29/2016   Carcinoma of contiguous sites of mouth  ([place]) 10/29/2016   Headache associated with sexual activity 05/14/2016   Tongue lesion 05/14/2016   Thrombocytopenia ([place]) 01/22/2016   Chronic diarrhea 01/22/2016   Chest pain    Essential hypertension    Spinal stenosis of lumbar region 01/20/2016    Class: Chronic   DDD (degenerative disc disease), lumbar 01/20/2016    Class: Chronic   Spinal stenosis of lumbar region with neurogenic claudication 01/20/2016   Low back pain 12/22/2015   Hypersomnolence 10/13/2015   Muscle cramp 08/01/2015   GERD (gastroesophageal reflux disease) 07/09/2015   Morbid obesity with BMI of 40.0-44.9, adult ([place]) 01/08/2013   Past Medical History:  Diagnosis Date   Allergy    Anemia    Iron deficiency   Anxiety    Arthritis    back, left shoulder   Blood transfusion without reported diagnosis    Chicken pox    Depression    Diarrhea    takes Imodium daily   GERD (gastroesophageal reflux disease)    H/O hiatal hernia    Headache(784.0)    History of kidney stones    1996ish   History of radiation therapy 11/16/16- 01/01/17   Base of Tongue/ 54 gy in 33 fractions/ Dose: 2 Gy   Hypertension    Iron deficiency anemia 12/11/2018   Migraine    none for 5 years (as of 01/13/16)   OSA (obstructive sleep apnea) 10/13/2015   unable to get cpap, plans to get one in 2018   Pneumonia    Restless legs    Scoliosis    Shingles 08/28/2013   Shortness of breath    with exertion   Sleep apnea    Tongue cancer ([place])    tongue cancer    Family History  Problem Relation Age of Onset   Heart disease Father    Heart attack Father    Hypertension Mother    Arthritis Mother    Diabetes Maternal Grandmother    Diabetes Paternal Grandmother    Diabetes Maternal Uncle    Colon polyps Neg Hx    Colon cancer Neg Hx    Esophageal cancer Neg Hx    Stomach cancer Neg Hx    Rectal cancer Neg Hx     Past Surgical History:  Procedure Laterality Date   BACK SURGERY  07/21/1978   CHOLECYSTECTOMY N/A  08/15/2013   Procedure: LAPAROSCOPIC CHOLECYSTECTOMY WITH INTRAOPERATIVE CHOLANGIOGRAM;  Surgeon: Gwenyth Ober, MD;  Location: [facility];  Service: General;  Laterality: N/A;   COLONOSCOPY N/A 01/09/2013   Procedure: COLONOSCOPY;  Surgeon: Saralyn Pilar  Renee Ramus, MD;  Location: [facility];  Service: Endoscopy;  Laterality: N/A;   COLONOSCOPY  03/2018   DIRECT LARYNGOSCOPY  07/2016   Dr. Nicolette Bang [facility]   ESOPHAGOGASTRODUODENOSCOPY  03/2018   ESOPHAGOGASTRODUODENOSCOPY (EGD) WITH PROPOFOL N/A 04/24/2019   Procedure: ESOPHAGOGASTRODUODENOSCOPY (EGD) WITH PROPOFOL;  Surgeon: Gatha Mayer, MD;  Location: [facility];  Service: Endoscopy;  Laterality: N/A;   GASTROSTOMY TUBE PLACEMENT  09/27/2016   GIVENS CAPSULE STUDY N/A 04/24/2019   Procedure: GIVENS CAPSULE STUDY;  Surgeon: Gatha Mayer, MD;  Location: [facility];  Service: Endoscopy;  Laterality: N/A;   HERNIA REPAIR Left 1981   IR PATIENT EVAL TECH 0-60 MINS  12/13/2016   IR PATIENT EVAL TECH 0-60 MINS  04/11/2017   IR PATIENT EVAL TECH 0-60 MINS  03/24/2018   IR PATIENT EVAL TECH 0-60 MINS  11/23/2018   IR PATIENT EVAL TECH 0-60 MINS  05/28/2019   IR REPLACE G-TUBE SIMPLE WO FLUORO  12/12/2017   IR REPLACE G-TUBE SIMPLE WO FLUORO  03/29/2018   IR REPLACE G-TUBE SIMPLE WO FLUORO  06/29/2018   IR REPLACE G-TUBE SIMPLE WO FLUORO  11/02/2018   IR REPLACE G-TUBE SIMPLE WO FLUORO  03/08/2019   IR REPLACE G-TUBE SIMPLE WO FLUORO  07/02/2019   IR REPLACE G-TUBE SIMPLE WO FLUORO  11/30/2019   IR REPLACE G-TUBE SIMPLE WO FLUORO  12/27/2019   MODIFIED RADICAL NECK DISSECTION Left 09/27/2016   Levels 1 & 2   PARTIAL GLOSSECTOMY Left 09/27/2016   Left hemi partial glossectomy   SPINE SURGERY  01/20/2016   fusion   TONSILLECTOMY     tracheotomy  09/27/2016   TUBAL LIGATION  06/1988   Social History   Occupational History   Occupation: Air cabin crew  Tobacco Use   Smoking status: Never Smoker   Smokeless tobacco: Never Used  Scientific laboratory technician Use:  Never used  Substance and Sexual Activity   Alcohol use: Yes    Alcohol/week: 7.0 standard drinks    Types: 7 Cans of beer per week   Drug use: No   Sexual activity: Yes    Birth control/protection: Surgical

## 2020-03-10 ENCOUNTER — Encounter: Payer: Self-pay | Admitting: Physical Medicine and Rehabilitation

## 2020-03-10 ENCOUNTER — Ambulatory Visit (INDEPENDENT_AMBULATORY_CARE_PROVIDER_SITE_OTHER): Payer: BC Managed Care – PPO | Admitting: Physical Medicine and Rehabilitation

## 2020-03-10 ENCOUNTER — Ambulatory Visit: Payer: Self-pay

## 2020-03-10 ENCOUNTER — Other Ambulatory Visit: Payer: Self-pay

## 2020-03-10 VITALS — BP 122/82 | HR 72

## 2020-03-10 DIAGNOSIS — M961 Postlaminectomy syndrome, not elsewhere classified: Secondary | ICD-10-CM | POA: Diagnosis not present

## 2020-03-10 DIAGNOSIS — M5416 Radiculopathy, lumbar region: Secondary | ICD-10-CM

## 2020-03-10 MED ORDER — DEXAMETHASONE SODIUM PHOSPHATE 10 MG/ML IJ SOLN
15.0000 mg | Freq: Once | INTRAMUSCULAR | Status: AC
  Administered 2020-03-10: 15 mg

## 2020-03-10 NOTE — Progress Notes (Signed)
Pt state lower back pain and upper back. Pt state walking, sitting and standing makes the pain worse. Pt state she takes pain meds to help ease the pain. Pt has hx of inj on 01/29/20 pt state it helped for three weeks.  Numeric Pain Rating Scale and Functional Assessment Average Pain 2   In the last MONTH (on 0-10 scale) has pain interfered with the following?  1. General activity like being  able to carry out your everyday physical activities such as walking, climbing stairs, carrying groceries, or moving a chair?  Rating(10)   +Driver, -BT, -Dye Allergies.

## 2020-03-10 NOTE — Procedures (Signed)
Lumbosacral Transforaminal Epidural Steroid Injection - Sub-Pedicular Approach with Fluoroscopic Guidance  Patient: Kristen [name]      Date of Birth: 09-01-1965 MRN: [id number] PCP: [name], MD      Visit Date: 03/10/2020   Universal Protocol:    Date/Time: 03/10/2020  Consent Given By: the patient  Position: PRONE  Additional Comments: Vital signs were monitored before and after the procedure. Patient was prepped and draped in the usual sterile fashion. The correct patient, procedure, and site was verified.   Injection Procedure Details:   Procedure diagnoses: Lumbar radiculopathy [M54.16]    Meds Administered:  Meds ordered this encounter  Medications  . dexamethasone (DECADRON) injection 15 mg    Laterality: Right  Location/Site:  L3-L4  Needle:5.0 in., 22 ga.  Short bevel or Quincke spinal needle  Needle Placement: Transforaminal  Findings:    -Comments: Excellent flow of contrast along the nerve, nerve root and into the epidural space.  Procedure Details: After squaring off the end-plates to get a true AP view, the C-arm was positioned so that an oblique view of the foramen as noted above was visualized. The target area is just inferior to the "nose of the scotty dog" or sub pedicular. The soft tissues overlying this structure were infiltrated with 2-3 ml. of 1% Lidocaine without Epinephrine.  The spinal needle was inserted toward the target using a "trajectory" view along the fluoroscope beam.  Under AP and lateral visualization, the needle was advanced so it did not puncture dura and was located close the 6 O'Clock position of the pedical in AP tracterory. Biplanar projections were used to confirm position. Aspiration was confirmed to be negative for CSF and/or blood. A 1-2 ml. volume of Isovue-250 was injected and flow of contrast was noted at each level. Radiographs were obtained for documentation purposes.   After attaining the desired flow of  contrast documented above, a 0.5 to 1.0 ml test dose of 0.25% Marcaine was injected into each respective transforaminal space.  The patient was observed for 90 seconds post injection.  After no sensory deficits were reported, and normal lower extremity motor function was noted,   the above injectate was administered so that equal amounts of the injectate were placed at each foramen (level) into the transforaminal epidural space.   Additional Comments:  The patient tolerated the procedure well Dressing: 2 x 2 sterile gauze and Band-Aid    Post-procedure details: Patient was observed during the procedure. Post-procedure instructions were reviewed.  Patient left the clinic in stable condition.

## 2020-03-10 NOTE — Progress Notes (Signed)
Kristen [name] - 55 y.o. female MRN [id number]  Date of birth: 07-Jun-1965  Office Visit Note: Visit Date: 03/10/2020 PCP: [name], MD Referred by: [name], MD  Subjective: Chief Complaint  Patient presents with  . Lower Back - Pain  . Middle Back - Pain   HPI:  Kristen [name] is a 55 y.o. female who comes in today for planned repeat Right L3-L4 Lumbar epidural steroid injection with fluoroscopic guidance.  The patient has failed conservative care including home exercise, medications, time and activity modification.  This injection will be diagnostic and hopefully therapeutic.  Please see requesting physician notes for further details and justification. Patient received more than 50% pain relief from prior injection.   Referring: Benjiman Core, PA-C   She is also complaining of a couple of days of right trapezial type pain.  I told her that if this were the last longer than a few weeks she can talk to [facility] her Dr. Louanne Skye.  I feel like this is more sprain strain or spasm.  She can use heat or ice.   ROS Otherwise per HPI.  Assessment & Plan: Visit Diagnoses:    ICD-10-CM   1. Lumbar radiculopathy  M54.16 XR C-ARM NO REPORT    Epidural Steroid injection    dexamethasone (DECADRON) injection 15 mg  2. Post laminectomy syndrome  M96.1 XR C-ARM NO REPORT    Epidural Steroid injection    dexamethasone (DECADRON) injection 15 mg    Plan: No additional findings.   Meds & Orders:  Meds ordered this encounter  Medications  . dexamethasone (DECADRON) injection 15 mg    Orders Placed This Encounter  Procedures  . XR C-ARM NO REPORT  . Epidural Steroid injection    Follow-up: Return for visit to requesting physician as needed.   Procedures: No procedures performed  Lumbosacral Transforaminal Epidural Steroid Injection - Sub-Pedicular Approach with Fluoroscopic Guidance  Patient: Kristen [name]      Date of Birth: 1965/11/04 MRN: [id number] PCP: [name], MD      Visit Date: 03/10/2020   Universal Protocol:    Date/Time: 03/10/2020  Consent Given By: the patient  Position: PRONE  Additional Comments: Vital signs were monitored before and after the procedure. Patient was prepped and draped in the usual sterile fashion. The correct patient, procedure, and site was verified.   Injection Procedure Details:   Procedure diagnoses: Lumbar radiculopathy [M54.16]    Meds Administered:  Meds ordered this encounter  Medications  . dexamethasone (DECADRON) injection 15 mg    Laterality: Right  Location/Site:  L3-L4  Needle:5.0 in., 22 ga.  Short bevel or Quincke spinal needle  Needle Placement: Transforaminal  Findings:    -Comments: Excellent flow of contrast along the nerve, nerve root and into the epidural space.  Procedure Details: After squaring off the end-plates to get a true AP view, the C-arm was positioned so that an oblique view of the foramen as noted above was visualized. The target area is just inferior to the "nose of the scotty dog" or sub pedicular. The soft tissues overlying this structure were infiltrated with 2-3 ml. of 1% Lidocaine without Epinephrine.  The spinal needle was inserted toward the target using a "trajectory" view along the fluoroscope beam.  Under AP and lateral visualization, the needle was advanced so it did not puncture dura and was located close the 6 O'Clock position of the pedical in AP tracterory. Biplanar projections were used to  confirm position. Aspiration was confirmed to be negative for CSF and/or blood. A 1-2 ml. volume of Isovue-250 was injected and flow of contrast was noted at each level. Radiographs were obtained for documentation purposes.   After attaining the desired flow of contrast documented above, a 0.5 to 1.0 ml test dose of 0.25% Marcaine was injected into each respective transforaminal space.  The patient was observed for 90 seconds post injection.  After no  sensory deficits were reported, and normal lower extremity motor function was noted,   the above injectate was administered so that equal amounts of the injectate were placed at each foramen (level) into the transforaminal epidural space.   Additional Comments:  The patient tolerated the procedure well Dressing: 2 x 2 sterile gauze and Band-Aid    Post-procedure details: Patient was observed during the procedure. Post-procedure instructions were reviewed.  Patient left the clinic in stable condition.      Clinical History: MRI LUMBAR SPINE WITHOUT AND WITH CONTRAST  TECHNIQUE: Multiplanar and multiecho pulse sequences of the lumbar spine were obtained without and with intravenous contrast.  CONTRAST:  21m MULTIHANCE GADOBENATE DIMEGLUMINE 529 MG/ML IV SOLN  COMPARISON:  Lumbar MRI 08/31/2018. Lumbar radiographs 02/05/2019 and earlier.  FINDINGS: Segmentation: Same numbering system as on the MRI last year designating partial sacralization of L5 on the left.  Alignment: Moderate levoconvex thoracolumbar scoliosis with associated chronic straightening of cervical lordosis. Mild chronic retrolisthesis of L3 on L4.  Vertebrae: Hardware susceptibility artifact related to long segment right side posterior spinal rod from the thoracic to the mid lumbar spine, superimposed posterior and interbody fusion hardware from L4 to S1.  No convincing marrow edema or acute osseous abnormality. Background bone marrow signal within normal limits. Visible sacrum and SI joints appear intact.  Conus medullaris and cauda equina: Conus extends to the T12-L1 level. No lower spinal cord or conus signal abnormality. Normal cauda equina nerve roots. No abnormal intradural enhancement or definite dural thickening.  Paraspinal and other soft tissues: Chronic postoperative changes to the paraspinal soft tissues. Stable visible abdominal viscera, including chronic renal cysts and/or  renal collecting system enlargement.  Disc levels:  Chronic posterior element fusion from the visible lower thoracic spine probably through the L3 level as demonstrated on CT last year. Capacious spinal canal through the mid L3 level, no stenosis.  L3-L4: Prior decompression and fusion. Residual endplate spurring and posterior element hypertrophy. No spinal stenosis. Borderline to mild left lateral recess stenosis appears stable from last year. Mild to moderate left and mild right L3 foraminal stenosis. This level appears stable.  L4-L5: Prior decompression and fusion. Residual endplate and posterior element hypertrophy mostly on the left. No spinal or lateral recess stenosis. Mild to moderate left but no convincing right L4 foraminal stenosis. This level is stable.  L5-S1: Decompression and fusion at this level is new since the MRI last year. Residual endplate spurring mostly on the left. There is no spinal stenosis. There is mild new architectural distortion at the right lateral recess, right S1 nerve level (series 6, image 34), but no convincing lateral recess stenosis. Moderate left L5 foraminal stenosis appears stable. No right foraminal stenosis.  IMPRESSION: 1. Decompression and fusion at L5-S1 since the MRI last year with no spinal stenosis. Mild new architectural distortion at the right lateral recess, but no convincing lateral recess stenosis. Stable moderate left L5 foraminal stenosis.  2. Stable prior L3-L4 and L4-L5 decompression and fusion with up to moderate residual left foraminal stenosis at  both levels, but only mild suspected right L3 foraminal stenosis which appears stable, no convincing right L4 foraminal stenosis.  3. Underlying chronic levoconvex thoracolumbar scoliosis with posterior spinal rod and solid posterior element fusion from the lower thoracic spine through L3.   Electronically Signed   By: Genevie Ann M.D.   On: 12/08/2019 08:45      Objective:  VS:  HT:    WT:   BMI:     BP:122/82  HR:72bpm  TEMP: ( )  RESP:  Physical Exam Vitals and nursing note reviewed.  Constitutional:      General: She is not in acute distress.    Appearance: Normal appearance. She is not ill-appearing.  HENT:     Head: Normocephalic and atraumatic.     Right Ear: External ear normal.     Left Ear: External ear normal.  Eyes:     Extraocular Movements: Extraocular movements intact.  Cardiovascular:     Rate and Rhythm: Normal rate.     Pulses: Normal pulses.  Pulmonary:     Effort: Pulmonary effort is normal. No respiratory distress.  Abdominal:     General: There is no distension.     Palpations: Abdomen is soft.  Musculoskeletal:        General: Tenderness present.     Cervical back: Neck supple.     Right lower leg: No edema.     Left lower leg: No edema.     Comments: Patient has good distal strength with no pain over the greater trochanters.  No clonus or focal weakness.  Skin:    Findings: No erythema, lesion or rash.  Neurological:     General: No focal deficit present.     Mental Status: She is alert and oriented to person, place, and time.     Sensory: No sensory deficit.     Motor: No weakness or abnormal muscle tone.     Coordination: Coordination normal.  Psychiatric:        Mood and Affect: Mood normal.        Behavior: Behavior normal.      Imaging: No results found.

## 2020-03-10 NOTE — Patient Instructions (Signed)

## 2020-03-11 NOTE — Progress Notes (Deleted)
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 Frequent nqps. No cataplexy.  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 HEENT:    headaches, difficulty swallowing, tooth/dental problems, sore throat,       sneezing, itching, ear ache, nasal congestion, post nasal drip, snoring CV:    chest pain, orthopnea, PND, +swelling in lower extremities, anasarca,                                                    dizziness, palpitations Resp:   shortness of breath with exertion or at rest.                productive cough,   non-productive cough, coughing up of blood.              change in color of mucus.  wheezing.   Skin:    rash or  lesions. GI:    + heartburn, indigestion, abdominal pain, nausea, vomiting, diarrhea,                 change in bowel habits, loss of appetite GU: dysuria, change in color of urine, no urgency or frequency.   flank pain. MS:   joint pain, stiffness, decreased range of motion, back pain. Neuro-     nothing unusual Psych:  change in mood or affect.  depression or anxiety.   memory loss.  OBJ- Physical Exam General- Alert, Oriented, Affect-appropriate, Distress- none acute,  Skin- rash-none, lesions- none, excoriation- none Lymphadenopathy- none Head- atraumatic            Eyes- Gross vision intact, PERRLA, conjunctivae and secretions clear            Ears- Hearing, canals-normal            Nose- Clear, no-Septal dev, mucus, polyps, erosion, perforation             Throat- Mallampati III-IV , mucosa clear , drainage- none, tonsils- atrophic, + edentulous                       +speech lightly slurred- chronic, + hoarse Neck- flexible , trachea midline, no stridor , thyroid nl, carotid no bruit Chest - symmetrical excursion , unlabored           Heart/CV- RRR , no murmur , no gallop  , no rub, nl s1 s2                           -  JVD- none , edema+2-3, stasis changes- none, varices- none           Lung- clear to P&A, wheeze- none, cough- none , dullness-none, rub- none           Chest wall- + postsurgical scars Abd-   + Gastric feeding tube Br/ Gen/ Rectal- Not done, not indicated Extrem- cyanosis- none, clubbing, none, atrophy- none, strength- nl, + brace L wrist Neuro- grossly intact to observation

## 2020-03-12 ENCOUNTER — Ambulatory Visit: Payer: BC Managed Care – PPO | Admitting: Internal Medicine

## 2020-03-12 ENCOUNTER — Ambulatory Visit: Payer: BC Managed Care – PPO | Admitting: Specialist

## 2020-03-26 ENCOUNTER — Ambulatory Visit (INDEPENDENT_AMBULATORY_CARE_PROVIDER_SITE_OTHER): Payer: BC Managed Care – PPO | Admitting: Specialist

## 2020-03-26 ENCOUNTER — Encounter: Payer: Self-pay | Admitting: Specialist

## 2020-03-26 ENCOUNTER — Other Ambulatory Visit: Payer: Self-pay

## 2020-03-26 VITALS — BP 113/73 | HR 76 | Ht 69.0 in | Wt 187.0 lb

## 2020-03-26 DIAGNOSIS — M4325 Fusion of spine, thoracolumbar region: Secondary | ICD-10-CM

## 2020-03-26 DIAGNOSIS — M4316 Spondylolisthesis, lumbar region: Secondary | ICD-10-CM

## 2020-03-26 DIAGNOSIS — T84498A Other mechanical complication of other internal orthopedic devices, implants and grafts, initial encounter: Secondary | ICD-10-CM

## 2020-03-26 DIAGNOSIS — M5416 Radiculopathy, lumbar region: Secondary | ICD-10-CM

## 2020-03-26 DIAGNOSIS — G96198 Other disorders of meninges, not elsewhere classified: Secondary | ICD-10-CM

## 2020-03-26 NOTE — Progress Notes (Signed)
Office Visit Note   Patient: Kristen [name]           Date of Birth: 10-26-1965           MRN: [id number] Visit Date: 03/26/2020              Requested by: Kristen Ruddy, MD [place],  [place] [id number] PCP: Kristen Ruddy, MD   Assessment & Plan: Visit Diagnoses:  1. Loosening of hardware in spine ([place])   2. Fusion of spine of thoracolumbar region   3. Spondylolisthesis of lumbar region   4. Epidural fibrosis   5. Radiculopathy, lumbar region     Plan: Avoid bending, stooping and avoid lifting weights greater than 10 lbs. Avoid prolong standing and walking. Avoid frequent bending and stooping  No lifting greater than 10 lbs. May use ice or moist heat for pain. Weight loss is of benefit. Handicap license is approved. Dr. Romona [name] secretary/Assistant will call to arrange for epidural steroid injection  Follow-Up Instructions: Return in about 4 weeks (around 04/23/2020).   Orders:  Orders Placed This Encounter  Procedures  . Ambulatory referral to Physical Medicine Rehab   No orders of the defined types were placed in this encounter.     Procedures: No procedures performed   Clinical Data: No additional findings.   Subjective: Chief Complaint  Patient presents with  . Lower Back - Follow-up    She had a Right L3-4 TF injection with Dr. Ernestina [name] on 03/10/20, she states that she did get good relief from it. States that on Monday she was driving and her leg went numb, she only had pain in her buttock, no pain in her back, this was the first time that this has happened.    55 year old female right handed with history of oral ca. She has a long thoracolumbar scoliosis and has right sided spondylosis changes associated with the scoliosis that cause nerve compression . She was Dr. Ernestina [name] for an    Review of Systems  Constitutional: Negative.   HENT: Negative.    Eyes: Negative.   Respiratory: Negative.    Cardiovascular: Negative.    Gastrointestinal: Negative.   Endocrine: Negative.   Genitourinary: Negative.   Musculoskeletal: Negative.   Skin: Negative.   Allergic/Immunologic: Negative.   Neurological: Negative.   Hematological: Negative.   Psychiatric/Behavioral: Negative.       Objective: Vital Signs: BP 113/73 (BP Location: Left Arm, Patient Position: Sitting)   Pulse 76   Ht 5' 9"  (1.753 m)   Wt 187 lb (84.8 kg)   LMP 11/15/2013   BMI 27.62 kg/m   Physical Exam Constitutional:      Appearance: She is well-developed.  HENT:     Head: Normocephalic and atraumatic.  Eyes:     Extraocular Movements: EOM normal.     Pupils: Pupils are equal, round, and reactive to light.  Pulmonary:     Effort: Pulmonary effort is normal.     Breath sounds: Normal breath sounds.  Abdominal:     General: Bowel sounds are normal.     Palpations: Abdomen is soft.  Musculoskeletal:     Cervical back: Normal range of motion and neck supple.     Lumbar back: Negative right straight leg raise test and negative left straight leg raise test.  Skin:    General: Skin is warm and dry.  Neurological:     Mental Status: She is alert and oriented to person, place,  and time.  Psychiatric:        Mood and Affect: Mood and affect normal.        Behavior: Behavior normal.        Thought Content: Thought content normal.        Judgment: Judgment normal.    Back Exam   Tenderness  The patient is experiencing tenderness in the lumbar.  Range of Motion  Extension:  abnormal  Flexion:  abnormal  Lateral bend right:  abnormal  Rotation right:  abnormal  Rotation left:  abnormal   Muscle Strength  Right Quadriceps:  5/5  Left Quadriceps:  5/5  Right Hamstrings:  5/5  Left Hamstrings:  5/5   Tests  Straight leg raise right: negative Straight leg raise left: negative  Reflexes  Patellar:  0/4 Achilles:  0/4  Other  Toe walk: normal     Specialty Comments:  No specialty comments available.  Imaging: No  results found.   PMFS History: Patient Active Problem List   Diagnosis Date Noted  . Pseudarthrosis after fusion or arthrodesis 04/20/2021    Priority: High    Class: Chronic  . Spinal stenosis of lumbar region 01/20/2016    Priority: High    Class: Chronic  . DDD (degenerative disc disease), lumbar 01/20/2016    Priority: High    Class: Chronic  . Protein-calorie malnutrition, severe 04/29/2021  . Pseudoarthrosis of lumbar spine 04/28/2021  . Fusion of spine of lumbar region 04/28/2021  . Tenosynovitis, de Quervain 12/14/2020  . Arthritis of carpometacarpal [facility]) joint of left thumb 12/14/2020  . Aortic atherosclerosis ([place]) 08/20/2020  . Hyperkalemia 07/24/2020  . Alcohol use 08/05/2019  . Bilateral lower extremity edema 08/05/2019  . Peripheral edema 03/12/2019  . Iron deficiency anemia 12/11/2018  . Loosening of hardware in spine ([place])   . Other spondylosis with radiculopathy, lumbar region   . History of lumbar spinal fusion 11/06/2018  . UTI (urinary tract infection) 05/15/2017  . 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 Malignant neoplasm of base of tongue ([place]) 10/29/2016  . 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[place]) 10/29/2016  . Headache associated with sexual activity 05/14/2016  . Tongue lesion 05/14/2016  . Thrombocytopenia ([place]) 01/22/2016  . Chronic diarrhea 01/22/2016  . Chest pain   . Essential hypertension   . Spinal stenosis of lumbar region with neurogenic claudication 01/20/2016  . Low back pain 12/22/2015  . Hypersomnolence 10/13/2015  . Muscle cramp 08/01/2015  . GERD (gastroesophageal reflux disease) 07/09/2015  . Morbid obesity with BMI of 40.0-44.9, adult ([place]) 01/08/2013   Past Medical History:  Diagnosis Date  . Allergy   . Anemia    Iron deficiency  . Anxiety   . Arthritis    back, left shoulder  . Blood transfusion without reported diagnosis   . Chicken pox   . Depression   . Diarrhea    takes Imodium daily   . GERD (gastroesophageal reflux disease)   . H/O hiatal hernia   . Headache(784.0)   . History of kidney stones    1996ish  . History of radiation therapy 11/16/16- 01/01/17   Base of Tongue/ 66 gy in 33 fractions/ Dose: 2 Gy  . Hypertension   . Iron deficiency anemia 12/11/2018  . Migraine    none for 5 years (as of 01/13/16)  . OSA (obstructive sleep apnea) 10/13/2015   unable to get cpap, plans to get one in 2018  . Pneumonia   .  Restless legs   . Scoliosis   . Shingles 08/28/2013  . Shortness of breath    with exertion  . Sleep apnea   . Tongue cancer ([place])    tongue cancer    Family History  Problem Relation Age of Onset  . Heart disease Father   . Heart attack Father   . Hypertension Mother   . Arthritis Mother   . Diabetes Maternal Grandmother   . Diabetes Paternal Grandmother   . Diabetes Maternal Uncle   . Colon polyps Neg Hx   . Colon cancer Neg Hx   . Esophageal cancer Neg Hx   . Stomach cancer Neg Hx   . Rectal cancer Neg Hx     Past Surgical History:  Procedure Laterality Date  . BACK SURGERY  07/21/1978  . CARPOMETACARPEL SUSPENSION PLASTY Left 01/21/2021   Procedure: Left Thumb Ligament Carpometacarpal Arthroplasty;  Surgeon: Sherilyn Cooter, MD;  Location: [facility];  Service: Orthopedics;  Laterality: Left;  . CHOLECYSTECTOMY N/A 08/15/2013   Procedure: LAPAROSCOPIC CHOLECYSTECTOMY WITH INTRAOPERATIVE CHOLANGIOGRAM;  Surgeon: Gwenyth Ober, MD;  Location: [place] [facility];  Service: General;  Laterality: N/A;  . COLONOSCOPY N/A 01/09/2013   Procedure: COLONOSCOPY;  Surgeon: Beryle Beams, MD;  Location: [place] [facility];  Service: Endoscopy;  Laterality: N/A;  . COLONOSCOPY  03/2018  . DIRECT LARYNGOSCOPY  07/2016   Dr. Nicolette Bang [facility]  . ESOPHAGOGASTRODUODENOSCOPY  03/2018  . ESOPHAGOGASTRODUODENOSCOPY (EGD) WITH PROPOFOL N/A 04/24/2019   Procedure: ESOPHAGOGASTRODUODENOSCOPY (EGD) WITH PROPOFOL;  Surgeon: Gatha Mayer, MD;  Location: [facility];  Service: Endoscopy;   Laterality: N/A;  . GASTROSTOMY TUBE PLACEMENT  09/27/2016  . GIVENS CAPSULE STUDY N/A 04/24/2019   Procedure: GIVENS CAPSULE STUDY;  Surgeon: Gatha Mayer, MD;  Location: [facility];  Service: Endoscopy;  Laterality: N/A;  . HERNIA REPAIR Left 1981  . IR PATIENT EVAL TECH 0-60 MINS  12/13/2016  . IR PATIENT EVAL TECH 0-60 MINS  04/11/2017  . IR PATIENT EVAL TECH 0-60 MINS  03/24/2018  . IR PATIENT EVAL TECH 0-60 MINS  11/23/2018  . IR PATIENT EVAL TECH 0-60 MINS  05/28/2019  . IR REPLACE G-TUBE SIMPLE WO FLUORO  12/12/2017  . IR REPLACE G-TUBE SIMPLE WO FLUORO  03/29/2018  . IR REPLACE G-TUBE SIMPLE WO FLUORO  06/29/2018  . IR REPLACE G-TUBE SIMPLE WO FLUORO  11/02/2018  . IR REPLACE G-TUBE SIMPLE WO FLUORO  03/08/2019  . IR REPLACE G-TUBE SIMPLE WO FLUORO  07/02/2019  . IR REPLACE G-TUBE SIMPLE WO FLUORO  11/30/2019  . IR REPLACE G-TUBE SIMPLE WO FLUORO  12/27/2019  . IR REPLACE G-TUBE SIMPLE WO FLUORO  05/27/2020  . IR REPLACE G-TUBE SIMPLE WO FLUORO  07/09/2020  . MODIFIED RADICAL NECK DISSECTION Left 09/27/2016   Levels 1 & 2  . PARTIAL GLOSSECTOMY Left 09/27/2016   Left hemi partial glossectomy  . SPINE SURGERY  01/20/2016   fusion  . TONSILLECTOMY    . tracheotomy  09/27/2016  . TUBAL LIGATION  06/1988   Social History   Occupational History  . Occupation: Air cabin crew  Tobacco Use  . Smoking status: Never  . Smokeless tobacco: Never  Vaping Use  . Vaping Use: Never used  Substance and Sexual Activity  . Alcohol use: Yes    Alcohol/week: 7.0 standard drinks of alcohol    Types: 7 Cans of beer per week    Comment: occasionally  . Drug use: No  . Sexual activity: Yes  Birth control/protection: Surgical

## 2020-03-26 NOTE — Patient Instructions (Signed)
Avoid bending, stooping and avoid lifting weights greater than 10 lbs. Avoid prolong standing and walking. Avoid frequent bending and stooping  No lifting greater than 10 lbs. May use ice or moist heat for pain. Weight loss is of benefit. Handicap license is approved. Dr. Romona Curls secretary/Assistant will call to arrange for epidural steroid injection

## 2020-03-28 ENCOUNTER — Other Ambulatory Visit: Payer: Self-pay

## 2020-03-28 ENCOUNTER — Encounter: Payer: Self-pay | Admitting: General Practice

## 2020-03-28 ENCOUNTER — Telehealth: Payer: Self-pay | Admitting: Physical Medicine and Rehabilitation

## 2020-03-28 ENCOUNTER — Ambulatory Visit
Admission: RE | Admit: 2020-03-28 | Discharge: 2020-03-28 | Disposition: A | Discharge status: Home / Self Care | Payer: BC Managed Care – PPO | Source: Ambulatory Visit | Attending: Radiation Oncology | Admitting: Radiation Oncology

## 2020-03-28 VITALS — BP 126/94 | HR 85 | Temp 98.8°F | Resp 18 | Wt 178.4 lb

## 2020-03-28 DIAGNOSIS — Z791 Long term (current) use of non-steroidal anti-inflammatories (NSAID): Secondary | ICD-10-CM | POA: Insufficient documentation

## 2020-03-28 DIAGNOSIS — F32A Depression, unspecified: Secondary | ICD-10-CM | POA: Insufficient documentation

## 2020-03-28 DIAGNOSIS — Z7982 Long term (current) use of aspirin: Secondary | ICD-10-CM | POA: Insufficient documentation

## 2020-03-28 DIAGNOSIS — Z85819 Personal history of malignant neoplasm of unspecified site of lip, oral cavity, and pharynx: Secondary | ICD-10-CM | POA: Insufficient documentation

## 2020-03-28 DIAGNOSIS — Z79899 Other long term (current) drug therapy: Secondary | ICD-10-CM | POA: Diagnosis not present

## 2020-03-28 DIAGNOSIS — K121 Other forms of stomatitis: Secondary | ICD-10-CM

## 2020-03-28 DIAGNOSIS — F3289 Other specified depressive episodes: Secondary | ICD-10-CM

## 2020-03-28 DIAGNOSIS — Z923 Personal history of irradiation: Secondary | ICD-10-CM | POA: Diagnosis not present

## 2020-03-28 DIAGNOSIS — C760 Malignant neoplasm of head, face and neck: Secondary | ICD-10-CM

## 2020-03-28 DIAGNOSIS — C01 Malignant neoplasm of base of tongue: Secondary | ICD-10-CM

## 2020-03-28 MED ORDER — FLUCONAZOLE 100 MG PO TABS: ORAL_TABLET | ORAL | 0 refills | Status: DC

## 2020-03-28 MED ORDER — MAGIC MOUTHWASH W/LIDOCAINE: ORAL | 3 refills | Status: DC

## 2020-03-28 MED ORDER — MAGIC MOUTHWASH W/LIDOCAINE: ORAL | 0 refills | Status: DC

## 2020-03-28 NOTE — Progress Notes (Signed)
[facility] CSW Progress Notes  Urgent referral placed by Dr Isidore Moos, patient requests help finding therapist.  Called patient, she reports mild depression, stress over relationships.  Has seen [facility] counselor in the past and completed [facility].  As [facility] counseling intern is not available at this time, CSW suggested referral to [facility] - asked radiation oncologist team to place Epic referral.  Provider will contact patient directly to schedule.  Edwyna Shell, LCSW Clinical Social Worker Phone:  [phone]

## 2020-03-28 NOTE — Progress Notes (Signed)
Kristen [name] presents for follow up of radiation completed 01/01/2017 to her tongue.   Pain issues, if any: Reports 5 out of 10 pain to her mouth and tongue. Reports she's dealing with thrush and is starting to develop a sore under her tongue. Also reports 3 out of 10 lower back pain Using a feeding tube?: Yes--only for medications Weight changes, if any:  Wt Readings from Last 3 Encounters:  03/28/20 178 lb 6.4 oz (80.9 kg)  03/26/20 187 lb (84.8 kg)  02/20/20 187 lb (84.8 kg)   Swallowing issues, if any: Yes--has had throat discomfort on and off for about a year. Even thin liquids are painful and difficult to get down. She is able to tolerate macaroni and cheese, grits, and creamed potatoes. She's tried several different mouth washes/gargles and chloraseptic spray, but states nothing alleviates the discomfort Smoking or chewing tobacco? None Using fluoride trays daily? No--hoping to get established with a dentist this year with her new Medicaid policy Last ENT visit was on: 05/22/2019 Saw Dr. Francina Ames:  "--There is no evidence of disease on exam today She sees Dr Isidore Moos in March 2022 --Return in September , certainly sooner if there are any questions or problems.  --She states she had a CXR in [place] earlier this year which looked fine."   Other notable issues, if any: Reports minor left ear pain. She states her jaw doesn't hurt when she opens fully, but the inside of her mouth does bother her when she opens fully. Denies any symptoms of lymphedema. Denies any difficulty falling asleep, but states she often wakes up in the middle or the night and has trouble falling back asleep (reports feeling fatigued throughout the day). No changes in bowel habits (still managing diarrhea with Lomotil ). Continues to deal with dry mouth and thick saliva (or no saliva production at all). States she feels depressed and is interested in being referred to a counselor/therapist   Vitals:   03/28/20  1025  BP: (!) 126/94  Pulse: 85  Resp: 18  Temp: 98.8 F (37.1 C)  SpO2: 100%

## 2020-03-28 NOTE — Telephone Encounter (Signed)
Called pt and sch.

## 2020-03-28 NOTE — Telephone Encounter (Signed)
Patient called. She would like an appointment with Dr Ernestina Patches. Her call back number is [phone]

## 2020-03-28 NOTE — Progress Notes (Signed)
[facility] CSW Progress Notes  Urgent referral received from radiation oncologist.  She requests a call back as she is at doctor w grandson.  Edwyna Shell, LCSW Clinical Social Worker Phone:  [phone]

## 2020-04-04 ENCOUNTER — Encounter: Payer: Self-pay | Admitting: Radiation Oncology

## 2020-04-04 NOTE — Progress Notes (Signed)
Radiation Oncology         [phone] ________________________________  Name: Kristen [name] MRN: [id number]  Date: 03/28/2020  DOB: [birth year]  Follow-Up Visit Note in person  CC: Kristen Ruddy, MD  Kristen Ruddy, MD  Diagnosis and Prior Radiotherapy:       ICD-10-CM   1. Malignant neoplasm of base of tongue ([facility])  C01 Ambulatory referral to Social Work    fluconazole (DIFLUCAN) 100 MG tablet    magic mouthwash w/lidocaine SOLN  2. Stomatitis  K12.1 DISCONTINUED: magic mouthwash w/lidocaine SOLN  3. 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  CHIEF COMPLAINT:  Here for follow-up and surveillance of head and neck cancer  Narrative:   Kristen [name] presents for follow up of radiation completed 01/01/2017 to her base of tongue.   Pain issues, if any: Reports 5 out of 10 pain to her mouth and tongue. Reports she's dealing with thrush and is starting to develop a sore under her tongue. Also reports 3 out of 10 lower back pain Using a feeding tube?: Yes--only for medications Weight changes, if any:  Wt Readings from Last 3 Encounters:  03/28/20 178 lb 6.4 oz (80.9 kg)  03/26/20 187 lb (84.8 kg)  02/20/20 187 lb (84.8 kg)   Swallowing issues, if any: Yes--has had throat discomfort on and off for about a year. Even thin liquids are painful and difficult to get down. She is able to tolerate macaroni and cheese, grits, and creamed potatoes. She's tried several different mouth washes/gargles and chloraseptic spray, but states nothing alleviates the discomfort Smoking or chewing tobacco? None Using fluoride trays daily? No--hoping to get established with a dentist this year with her new Medicaid policy Last ENT visit was on: Nov 2021 Saw Kristen [name]: No evidence of disease on exam, she sees him again in April  Other notable issues, if any: Reports minor left  ear pain. She states her jaw doesn't hurt when she opens fully, but the inside of her mouth does bother her when she opens fully. Denies any symptoms of lymphedema. Denies any difficulty falling asleep, but states she often wakes up in the middle or the night and has trouble falling back asleep (reports feeling fatigued throughout the day). No changes in bowel habits (still managing diarrhea with Lomotil ). Continues to deal with dry mouth and thick saliva (or no saliva production at all). States she feels depressed and is interested in being referred to a counselor/therapist, denies suicidality.  Continued chronic back pain, this preceded her head and neck cancer diagnosis, following with orthopedics  ALLERGIES:  is allergic to elemental sulfur and other.  Meds: Current Outpatient Medications  Medication Sig Dispense Refill  . fluconazole (DIFLUCAN) 100 MG tablet Take 2 tablets today, then 1 tablet daily x 13 more days. 15 tablet 0  . magic mouthwash w/lidocaine SOLN Swish and or swallow 10 mL up to QID prn. 250 mL 3  . amLODipine (NORVASC) 10 MG tablet TAKE 1 TABLET BY MOUTH EVERY DAY 90 tablet 3  . amphetamine-dextroamphetamine (ADDERALL XR) 20 MG 24 hr capsule Take 1 capsule (20 mg total) by mouth daily. 30 capsule 0  . amphetamine-dextroamphetamine (ADDERALL) 10 MG tablet 1-3 daily as needed for sleepiness 90 tablet 0  . aspirin 81 MG chewable tablet Place 81 mg into feeding tube daily.     Marland Kitchen  baclofen (LIORESAL) 10 MG tablet PLACE 1 TABLET VIA FEEDING TUBE THREE TIMES DAILY 90 tablet 6  . Calcium Citrate 200 MG TABS 200 mg by PEG Tube route daily at 12 noon.    Marland Kitchen DEXILANT 60 MG capsule Take 1 capsule by mouth daily.    . diclofenac (VOLTAREN) 75 MG EC tablet TAKE 1 TABLET BY MOUTH TWICE DAILY WITH FOOD AS NEEDED FOR PAIN 180 tablet 2  . diclofenac Sodium (VOLTAREN) 1 % GEL Apply 2 g topically 4 (four) times daily. (Patient taking differently: Apply 2 g topically 3 (three) times daily as  needed (Pain).) 350 g 1  . diphenoxylate-atropine (LOMOTIL) 2.5-0.025 MG tablet PLACE 2 TABLETS INTO FEEDING TUBE TWICE DAILY 120 tablet 3  . Elastic Bandages & Supports (JOBST KNEE HIGH COMPRESSION SM) MISC 1 Units by Does not apply route daily. 3 each 1  . ferrous sulfate 220 (44 Fe) MG/5ML solution PLACE 6.8MLS(300MG) INTO FEEDING TUBE DAILY 010 mL 11  . folic acid (FOLVITE) 272 MCG tablet Place 400 mcg into feeding tube daily.    . hydrocortisone cream 1 % APPLY EXTERNALLY TO THE AFFECTED AREA TWICE DAILY (Patient taking differently: Apply 1 application topically 2 (two) times daily as needed for itching.) 30 g 0  . meloxicam (MOBIC) 15 MG tablet TAKE 1 TABLET(15 MG) BY MOUTH DAILY 30 tablet 2  . Multiple Vitamin (MULTIVITAMIN WITH MINERALS) TABS tablet Place 1 tablet into feeding tube daily. (Patient taking differently: Place 1 tablet into feeding tube daily. Centrum) 30 tablet 6  . pantoprazole (PROTONIX) 40 MG tablet TAKE 1 TABLET BY MOUTH DAILY 90 tablet 3  . potassium chloride SA (KLOR-CON) 20 MEQ tablet TAKE 1 TABLET BY MOUTH TWICE DAILY FOR THE NEXT 3 DAYS. OKAY TO CRUSH IF UNABLE TO SWALLOW. CAN ALSO TAKE PER TUBE IF NEEDED. 6 tablet 0  . POTASSIUM GLUCONATE PO 99 mEq by PEG Tube route at bedtime.     . pregabalin (LYRICA) 75 MG capsule Take 1 capsule (75 mg total) by mouth 2 (two) times daily. 60 capsule 0  . SUMAtriptan (IMITREX) 100 MG tablet Take 0.5-1 tablet by mouth at the onset of migraine and may repeat in 2 hours if headache persists or recurs. (Patient taking differently: Place 100 mg into feeding tube every 2 (two) hours as needed for migraine. Take 0.5-1 tablet by mouth at the onset of migraine and may repeat in 2 hours if headache persists or recurs.) 10 tablet 0  . traMADol (ULTRAM) 50 MG tablet TAKE 1 TABLET(50 MG) BY MOUTH EVERY 6 HOURS AS NEEDED 30 tablet 0   No current facility-administered medications for this encounter.    Physical Findings: Wt Readings from Last 3  Encounters:  03/28/20 178 lb 6.4 oz (80.9 kg)  03/26/20 187 lb (84.8 kg)  02/20/20 187 lb (84.8 kg)    weight is 178 lb 6.4 oz (80.9 kg). Her oral temperature is 98.8 F (37.1 C). Her blood pressure is 126/94 (abnormal) and her pulse is 85. Her respiration is 18 and oxygen saturation is 100%. .  General: Alert and oriented, in no acute distress HEENT: Head is normocephalic. Extraocular movements are intact.  Postoperative changes in the tongue.  Nonspecific white mucosa over the tongue.  No lesions appreciated in the oropharynx  Neck: In the cervical and supraclavicular regions, there are no palpable masses appreciated.  Scar present at prior tracheotomy site  Psychiatric: Affect is appropriate, judgment and insight intact  Lab Findings: Lab Results  Component  Value Date   WBC 4.0 07/30/2019   HGB 12.8 07/30/2019   HCT 39.6 07/30/2019   MCV 93.0 07/30/2019   PLT 192 07/30/2019    Lab Results  Component Value Date   TSH 2.58 07/30/2019    Radiographic Findings: Epidural Steroid injection  Result Date: 03/10/2020 Magnus Sinning, MD     03/10/2020  9:56 AM Lumbosacral Transforaminal Epidural Steroid Injection - Sub-Pedicular Approach with Fluoroscopic Guidance Patient: SHERLY BRODBECK     Date of Birth: [birth year] MRN: [id number] PCP: Kristen Ruddy, MD     Visit Date: 03/10/2020  Universal Protocol:   Date/Time: 03/10/2020 Consent Given By: the patient Position: PRONE Additional Comments: Vital signs were monitored before and after the procedure. Patient was prepped and draped in the usual sterile fashion. The correct patient, procedure, and site was verified. Injection Procedure Details: Procedure diagnoses: Lumbar radiculopathy [M54.16]  Meds Administered: Meds ordered this encounter Medications . dexamethasone (DECADRON) injection 15 mg Laterality: Right Location/Site: L3-L4 Needle:5.0 in., 22 ga.  Short bevel or Quincke spinal needle Needle Placement: Transforaminal Findings:    -Comments: Excellent flow of contrast along the nerve, nerve root and into the epidural space. Procedure Details: After squaring off the end-plates to get a true AP view, the C-arm was positioned so that an oblique view of the foramen as noted above was visualized. The target area is just inferior to the "nose of the scotty dog" or sub pedicular. The soft tissues overlying this structure were infiltrated with 2-3 ml. of 1% Lidocaine without Epinephrine. The spinal needle was inserted toward the target using a "trajectory" view along the fluoroscope beam.  Under AP and lateral visualization, the needle was advanced so it did not puncture dura and was located close the 6 O'Clock position of the pedical in AP tracterory. Biplanar projections were used to confirm position. Aspiration was confirmed to be negative for CSF and/or blood. A 1-2 ml. volume of Isovue-250 was injected and flow of contrast was noted at each level. Radiographs were obtained for documentation purposes. After attaining the desired flow of contrast documented above, a 0.5 to 1.0 ml test dose of 0.25% Marcaine was injected into each respective transforaminal space.  The patient was observed for 90 seconds post injection.  After no sensory deficits were reported, and normal lower extremity motor function was noted,   the above injectate was administered so that equal amounts of the injectate were placed at each foramen (level) into the transforaminal epidural space. Additional Comments: The patient tolerated the procedure well Dressing: 2 x 2 sterile gauze and Band-Aid  Post-procedure details: Patient was observed during the procedure. Post-procedure instructions were reviewed. Patient left the clinic in stable condition.   XR C-ARM NO REPORT  Result Date: 03/10/2020 Please see Notes tab for imaging impression.   Impression/Plan:   1) Head and Neck Cancer Status: NED  2) thyroid function: Most recent thyroid test was performed in July by  another physician, within normal limits, continue to check annually Lab Results  Component Value Date   TSH 2.58 07/30/2019    3) She reports depression and is open to counseling.  We talked about this and emotional support was provided.  I will make a referral to social work today.  4) She reports continued soreness in her mouth, unclear if she has thrush on exam today, but out of caution I will prescribe fluconazole and also refill lidocaine based mouthwash for her  5) I believe she will benefit from following  regularly with our head and neck cancer survivorship program.  I will make a referral for her to see a provider in 6 months.  I will see her back on an as-needed basis.  As above, she is also referred to social work due to reports of depression. Follow-up with ENT as scheduled.  6) We discussed measures to reduce the risk of infection during the COVID-19 pandemic.   She is vaccinated and boosted   On date of service, in total, I spent 25 minutes on this encounter.  _____   Eppie Gibson, MD

## 2020-04-14 ENCOUNTER — Other Ambulatory Visit: Payer: Self-pay | Admitting: Specialist

## 2020-04-15 ENCOUNTER — Encounter: Payer: Self-pay | Admitting: Physical Medicine and Rehabilitation

## 2020-04-15 ENCOUNTER — Ambulatory Visit: Payer: Self-pay

## 2020-04-15 ENCOUNTER — Other Ambulatory Visit: Payer: Self-pay

## 2020-04-15 ENCOUNTER — Ambulatory Visit (INDEPENDENT_AMBULATORY_CARE_PROVIDER_SITE_OTHER): Payer: BC Managed Care – PPO | Admitting: Physical Medicine and Rehabilitation

## 2020-04-15 VITALS — BP 117/80 | HR 71

## 2020-04-15 DIAGNOSIS — M5416 Radiculopathy, lumbar region: Secondary | ICD-10-CM

## 2020-04-15 MED ORDER — METHYLPREDNISOLONE ACETATE 80 MG/ML IJ SUSP
80.0000 mg | Freq: Once | INTRAMUSCULAR | Status: AC
Start: 2020-04-15 — End: 2020-04-15
  Administered 2020-04-15: 80 mg

## 2020-04-15 NOTE — Progress Notes (Signed)
Kristen [name] - 55 y.o. female MRN [id number]  Date of birth: 11-Mar-1965  Office Visit Note: Visit Date: 04/15/2020 PCP: [name], MD Referred by: [name], MD  Subjective: Chief Complaint  Patient presents with  . Middle Back - Pain  . Lower Back - Pain   HPI:  Kristen [name] is a 55 y.o. female who comes in today for planned repeat Right L3-L4 Lumbar epidural steroid injection with fluoroscopic guidance.  The patient has failed conservative care including home exercise, medications, time and activity modification.  This injection will be diagnostic and hopefully therapeutic.  Please see requesting physician notes for further details and justification. Prior injection did offer more than 100% relief as noted above and did give the patient more functional ability for activities of daily living and was also beneficial in that it did reduce their medication requirement.  They have had physical therapy and continue with home exercises.  Current medication management is not beneficial in increasing their functional status.  Procedures are done as part of a comprehensive orthopedic and pain management program with access to in-house orthopedics, spine surgery and physical therapy as well as access to [place] biopsychosocial counseling if needed. Will use depomedrol this injection.  Referring: Dr. Basil Dess    ROS Otherwise per HPI.  Assessment & Plan: Visit Diagnoses:    ICD-10-CM   1. Lumbar radiculopathy  M54.16 XR C-ARM NO REPORT    Epidural Steroid injection    methylPREDNISolone acetate (DEPO-MEDROL) injection 80 mg    Plan: No additional findings.   Meds & Orders:  Meds ordered this encounter  Medications  . methylPREDNISolone acetate (DEPO-MEDROL) injection 80 mg    Orders Placed This Encounter  Procedures  . XR C-ARM NO REPORT  . Epidural Steroid injection    Follow-up: Return for visit to requesting physician as needed.    Procedures: No procedures performed  Lumbosacral Transforaminal Epidural Steroid Injection - Sub-Pedicular Approach with Fluoroscopic Guidance  Patient: Kristen [name]      Date of Birth: 11-Mar-1965 MRN: [id number] PCP: [name], MD      Visit Date: 04/15/2020   Universal Protocol:    Date/Time: 04/15/2020  Consent Given By: the patient  Position: PRONE  Additional Comments: Vital signs were monitored before and after the procedure. Patient was prepped and draped in the usual sterile fashion. The correct patient, procedure, and site was verified.   Injection Procedure Details:   Procedure diagnoses: Lumbar radiculopathy [M54.16]    Meds Administered:  Meds ordered this encounter  Medications  . methylPREDNISolone acetate (DEPO-MEDROL) injection 80 mg    Laterality: Right  Location/Site:  L3-L4  Needle:5.0 in., 22 ga.  Short bevel or Quincke spinal needle  Needle Placement: Transforaminal  Findings:    -Comments: Excellent flow of contrast along the nerve, nerve root and into the epidural space.  Procedure Details: After squaring off the end-plates to get a true AP view, the C-arm was positioned so that an oblique view of the foramen as noted above was visualized. The target area is just inferior to the "nose of the scotty dog" or sub pedicular. The soft tissues overlying this structure were infiltrated with 2-3 ml. of 1% Lidocaine without Epinephrine.  The spinal needle was inserted toward the target using a "trajectory" view along the fluoroscope beam.  Under AP and lateral visualization, the needle was advanced so it did not puncture dura and was located close the 6 O'Clock  position of the pedical in AP tracterory. Biplanar projections were used to confirm position. Aspiration was confirmed to be negative for CSF and/or blood. A 1-2 ml. volume of Isovue-250 was injected and flow of contrast was noted at each level. Radiographs were obtained for  documentation purposes.   After attaining the desired flow of contrast documented above, a 0.5 to 1.0 ml test dose of 0.25% Marcaine was injected into each respective transforaminal space.  The patient was observed for 90 seconds post injection.  After no sensory deficits were reported, and normal lower extremity motor function was noted,   the above injectate was administered so that equal amounts of the injectate were placed at each foramen (level) into the transforaminal epidural space.   Additional Comments:  The patient tolerated the procedure well Dressing: 2 x 2 sterile gauze and Band-Aid    Post-procedure details: Patient was observed during the procedure. Post-procedure instructions were reviewed.  Patient left the clinic in stable condition.      Clinical History: MRI LUMBAR SPINE WITHOUT AND WITH CONTRAST  TECHNIQUE: Multiplanar and multiecho pulse sequences of the lumbar spine were obtained without and with intravenous contrast.  CONTRAST:  51m MULTIHANCE GADOBENATE DIMEGLUMINE 529 MG/ML IV SOLN  COMPARISON:  Lumbar MRI 08/31/2018. Lumbar radiographs 02/05/2019 and earlier.  FINDINGS: Segmentation: Same numbering system as on the MRI last year designating partial sacralization of L5 on the left.  Alignment: Moderate levoconvex thoracolumbar scoliosis with associated chronic straightening of cervical lordosis. Mild chronic retrolisthesis of L3 on L4.  Vertebrae: Hardware susceptibility artifact related to long segment right side posterior spinal rod from the thoracic to the mid lumbar spine, superimposed posterior and interbody fusion hardware from L4 to S1.  No convincing marrow edema or acute osseous abnormality. Background bone marrow signal within normal limits. Visible sacrum and SI joints appear intact.  Conus medullaris and cauda equina: Conus extends to the T12-L1 level. No lower spinal cord or conus signal abnormality. Normal cauda equina  nerve roots. No abnormal intradural enhancement or definite dural thickening.  Paraspinal and other soft tissues: Chronic postoperative changes to the paraspinal soft tissues. Stable visible abdominal viscera, including chronic renal cysts and/or renal collecting system enlargement.  Disc levels:  Chronic posterior element fusion from the visible lower thoracic spine probably through the L3 level as demonstrated on CT last year. Capacious spinal canal through the mid L3 level, no stenosis.  L3-L4: Prior decompression and fusion. Residual endplate spurring and posterior element hypertrophy. No spinal stenosis. Borderline to mild left lateral recess stenosis appears stable from last year. Mild to moderate left and mild right L3 foraminal stenosis. This level appears stable.  L4-L5: Prior decompression and fusion. Residual endplate and posterior element hypertrophy mostly on the left. No spinal or lateral recess stenosis. Mild to moderate left but no convincing right L4 foraminal stenosis. This level is stable.  L5-S1: Decompression and fusion at this level is new since the MRI last year. Residual endplate spurring mostly on the left. There is no spinal stenosis. There is mild new architectural distortion at the right lateral recess, right S1 nerve level (series 6, image 34), but no convincing lateral recess stenosis. Moderate left L5 foraminal stenosis appears stable. No right foraminal stenosis.  IMPRESSION: 1. Decompression and fusion at L5-S1 since the MRI last year with no spinal stenosis. Mild new architectural distortion at the right lateral recess, but no convincing lateral recess stenosis. Stable moderate left L5 foraminal stenosis.  2. Stable prior L3-L4 and L4-L5  decompression and fusion with up to moderate residual left foraminal stenosis at both levels, but only mild suspected right L3 foraminal stenosis which appears stable, no convincing right L4 foraminal  stenosis.  3. Underlying chronic levoconvex thoracolumbar scoliosis with posterior spinal rod and solid posterior element fusion from the lower thoracic spine through L3.   Electronically Signed   By: Genevie Ann M.D.   On: 12/08/2019 08:45     Objective:  VS:  HT:    WT:   BMI:     BP:117/80  HR:71bpm  TEMP: ( )  RESP:  Physical Exam Vitals and nursing note reviewed.  Constitutional:      General: She is not in acute distress.    Appearance: Normal appearance. She is not ill-appearing.  HENT:     Head: Normocephalic and atraumatic.     Right Ear: External ear normal.     Left Ear: External ear normal.  Eyes:     Extraocular Movements: Extraocular movements intact.  Cardiovascular:     Rate and Rhythm: Normal rate.     Pulses: Normal pulses.  Pulmonary:     Effort: Pulmonary effort is normal. No respiratory distress.  Abdominal:     General: There is no distension.     Palpations: Abdomen is soft.  Musculoskeletal:        General: Tenderness present.     Cervical back: Neck supple.     Right lower leg: No edema.     Left lower leg: No edema.     Comments: Patient has good distal strength with no pain over the greater trochanters.  No clonus or focal weakness.  Skin:    Findings: No erythema, lesion or rash.  Neurological:     General: No focal deficit present.     Mental Status: She is alert and oriented to person, place, and time.     Sensory: No sensory deficit.     Motor: No weakness or abnormal muscle tone.     Coordination: Coordination normal.  Psychiatric:        Mood and Affect: Mood normal.        Behavior: Behavior normal.      Imaging: No results found.

## 2020-04-15 NOTE — Progress Notes (Signed)
Pt state middle and lower back pain. Pt state walking makes the pain worse. Pt state she take over the counter meds to help ease the pain. Pt has hx of inj on 03/10/20 pt state it help for fifteen days with 100% relief.  Numeric Pain Rating Scale and Functional Assessment Average Pain 9   In the last MONTH (on 0-10 scale) has pain interfered with the following?  1. General activity like being  able to carry out your everyday physical activities such as walking, climbing stairs, carrying groceries, or moving a chair?  Rating(9)   +Driver, -BT, -Dye Allergies.

## 2020-04-15 NOTE — Patient Instructions (Signed)

## 2020-04-15 NOTE — Procedures (Signed)
Lumbosacral Transforaminal Epidural Steroid Injection - Sub-Pedicular Approach with Fluoroscopic Guidance  Patient: Kristen [name]      Date of Birth: [birth year] MRN: [id number] PCP: [name], MD      Visit Date: 04/15/2020   Universal Protocol:    Date/Time: 04/15/2020  Consent Given By: the patient  Position: PRONE  Additional Comments: Vital signs were monitored before and after the procedure. Patient was prepped and draped in the usual sterile fashion. The correct patient, procedure, and site was verified.   Injection Procedure Details:   Procedure diagnoses: Lumbar radiculopathy [M54.16]    Meds Administered:  Meds ordered this encounter  Medications  . methylPREDNISolone acetate (DEPO-MEDROL) injection 80 mg    Laterality: Right  Location/Site:  L3-L4  Needle:5.0 in., 22 ga.  Short bevel or Quincke spinal needle  Needle Placement: Transforaminal  Findings:    -Comments: Excellent flow of contrast along the nerve, nerve root and into the epidural space.  Procedure Details: After squaring off the end-plates to get a true AP view, the C-arm was positioned so that an oblique view of the foramen as noted above was visualized. The target area is just inferior to the "nose of the scotty dog" or sub pedicular. The soft tissues overlying this structure were infiltrated with 2-3 ml. of 1% Lidocaine without Epinephrine.  The spinal needle was inserted toward the target using a "trajectory" view along the fluoroscope beam.  Under AP and lateral visualization, the needle was advanced so it did not puncture dura and was located close the 6 O'Clock position of the pedical in AP tracterory. Biplanar projections were used to confirm position. Aspiration was confirmed to be negative for CSF and/or blood. A 1-2 ml. volume of Isovue-250 was injected and flow of contrast was noted at each level. Radiographs were obtained for documentation purposes.   After attaining the  desired flow of contrast documented above, a 0.5 to 1.0 ml test dose of 0.25% Marcaine was injected into each respective transforaminal space.  The patient was observed for 90 seconds post injection.  After no sensory deficits were reported, and normal lower extremity motor function was noted,   the above injectate was administered so that equal amounts of the injectate were placed at each foramen (level) into the transforaminal epidural space.   Additional Comments:  The patient tolerated the procedure well Dressing: 2 x 2 sterile gauze and Band-Aid    Post-procedure details: Patient was observed during the procedure. Post-procedure instructions were reviewed.  Patient left the clinic in stable condition.

## 2020-04-24 ENCOUNTER — Ambulatory Visit: Payer: BC Managed Care – PPO | Admitting: Specialist

## 2020-05-01 ENCOUNTER — Ambulatory Visit: Payer: BC Managed Care – PPO | Admitting: Specialist

## 2020-05-07 ENCOUNTER — Emergency Department (HOSPITAL_COMMUNITY): Payer: BC Managed Care – PPO

## 2020-05-07 ENCOUNTER — Emergency Department (HOSPITAL_COMMUNITY)
Admission: EM | Admit: 2020-05-07 | Discharge: 2020-05-07 | Disposition: A | Discharge status: Home / Self Care | Payer: BC Managed Care – PPO | Attending: Emergency Medicine | Admitting: Emergency Medicine

## 2020-05-07 ENCOUNTER — Other Ambulatory Visit: Payer: Self-pay

## 2020-05-07 ENCOUNTER — Encounter (HOSPITAL_COMMUNITY): Payer: Self-pay | Admitting: Emergency Medicine

## 2020-05-07 DIAGNOSIS — Z79899 Other long term (current) drug therapy: Secondary | ICD-10-CM | POA: Diagnosis not present

## 2020-05-07 DIAGNOSIS — Z9851 Tubal ligation status: Secondary | ICD-10-CM | POA: Insufficient documentation

## 2020-05-07 DIAGNOSIS — Z85858 Personal history of malignant neoplasm of other endocrine glands: Secondary | ICD-10-CM | POA: Diagnosis not present

## 2020-05-07 DIAGNOSIS — M79604 Pain in right leg: Secondary | ICD-10-CM | POA: Insufficient documentation

## 2020-05-07 DIAGNOSIS — N3000 Acute cystitis without hematuria: Secondary | ICD-10-CM | POA: Diagnosis not present

## 2020-05-07 DIAGNOSIS — I10 Essential (primary) hypertension: Secondary | ICD-10-CM | POA: Diagnosis not present

## 2020-05-07 DIAGNOSIS — M545 Low back pain, unspecified: Secondary | ICD-10-CM | POA: Diagnosis not present

## 2020-05-07 DIAGNOSIS — E876 Hypokalemia: Secondary | ICD-10-CM | POA: Insufficient documentation

## 2020-05-07 DIAGNOSIS — Z85810 Personal history of malignant neoplasm of tongue: Secondary | ICD-10-CM | POA: Insufficient documentation

## 2020-05-07 DIAGNOSIS — R200 Anesthesia of skin: Secondary | ICD-10-CM

## 2020-05-07 DIAGNOSIS — Z7982 Long term (current) use of aspirin: Secondary | ICD-10-CM | POA: Insufficient documentation

## 2020-05-07 DIAGNOSIS — R202 Paresthesia of skin: Secondary | ICD-10-CM | POA: Diagnosis not present

## 2020-05-07 DIAGNOSIS — R531 Weakness: Secondary | ICD-10-CM | POA: Insufficient documentation

## 2020-05-07 LAB — CBC WITH DIFFERENTIAL/PLATELET
Abs Immature Granulocytes: 0.02 10*3/uL (ref 0.00–0.07)
Basophils Absolute: 0 10*3/uL (ref 0.0–0.1)
Basophils Relative: 1 %
Eosinophils Absolute: 0.2 10*3/uL (ref 0.0–0.5)
Eosinophils Relative: 5 %
HCT: 40.4 % (ref 36.0–46.0)
Hemoglobin: 13.4 g/dL (ref 12.0–15.0)
Immature Granulocytes: 1 %
Lymphocytes Relative: 19 %
Lymphs Abs: 0.8 10*3/uL (ref 0.7–4.0)
MCH: 29.9 pg (ref 26.0–34.0)
MCHC: 33.2 g/dL (ref 30.0–36.0)
MCV: 90.2 fL (ref 80.0–100.0)
Monocytes Absolute: 0.3 10*3/uL (ref 0.1–1.0)
Monocytes Relative: 9 %
Neutro Abs: 2.7 10*3/uL (ref 1.7–7.7)
Neutrophils Relative %: 65 %
Platelets: 172 10*3/uL (ref 150–400)
RBC: 4.48 MIL/uL (ref 3.87–5.11)
RDW: 13 % (ref 11.5–15.5)
WBC: 4 10*3/uL (ref 4.0–10.5)
nRBC: 0 % (ref 0.0–0.2)

## 2020-05-07 LAB — RAPID URINE DRUG SCREEN, HOSP PERFORMED
Amphetamines: NOT DETECTED
Barbiturates: NOT DETECTED
Benzodiazepines: NOT DETECTED
Cocaine: NOT DETECTED
Opiates: NOT DETECTED
Tetrahydrocannabinol: NOT DETECTED

## 2020-05-07 LAB — COMPREHENSIVE METABOLIC PANEL
ALT: 13 U/L (ref 0–44)
AST: 16 U/L (ref 15–41)
Albumin: 3.8 g/dL (ref 3.5–5.0)
Alkaline Phosphatase: 95 U/L (ref 38–126)
Anion gap: 8 (ref 5–15)
BUN: 13 mg/dL (ref 6–20)
CO2: 27 mmol/L (ref 22–32)
Calcium: 8.9 mg/dL (ref 8.9–10.3)
Chloride: 107 mmol/L (ref 98–111)
Creatinine, Ser: 0.65 mg/dL (ref 0.44–1.00)
GFR, Estimated: >60 mL/min (ref >60)
Glucose, Bld: 89 mg/dL (ref 70–99)
Potassium: 3.2 mmol/L — ABNORMAL LOW (ref 3.5–5.1)
Sodium: 142 mmol/L (ref 135–145)
Total Bilirubin: 1.5 mg/dL — ABNORMAL HIGH (ref 0.3–1.2)
Total Protein: 6.9 g/dL (ref 6.5–8.1)

## 2020-05-07 LAB — URINALYSIS, ROUTINE W REFLEX MICROSCOPIC
Bilirubin Urine: NEGATIVE
Glucose, UA: NEGATIVE mg/dL
Hgb urine dipstick: NEGATIVE
Ketones, ur: 20 mg/dL — AB
Nitrite: NEGATIVE
Protein, ur: NEGATIVE mg/dL
Specific Gravity, Urine: 1.016 (ref 1.005–1.030)
pH: 5 (ref 5.0–8.0)

## 2020-05-07 MED ORDER — POTASSIUM CHLORIDE 20 MEQ PO PACK
40.0000 meq | PACK | Freq: Every day | ORAL | Status: DC
  Administered 2020-05-07: 40 meq
  Filled 2020-05-07: qty 2

## 2020-05-07 MED ORDER — POTASSIUM CHLORIDE CRYS ER 20 MEQ PO TBCR: 40.0000 meq | EXTENDED_RELEASE_TABLET | Freq: Once | ORAL | Status: DC

## 2020-05-07 MED ORDER — CEPHALEXIN 500 MG PO CAPS: 500.0000 mg | ORAL_CAPSULE | Freq: Four times a day (QID) | ORAL | 0 refills | Status: AC

## 2020-05-07 MED ORDER — IOHEXOL 300 MG/ML  SOLN
75.0000 mL | Freq: Once | INTRAMUSCULAR | Status: AC | PRN
  Administered 2020-05-07: 75 mL via INTRAVENOUS

## 2020-05-07 NOTE — ED Notes (Signed)
Lab called to confirm urine culture.

## 2020-05-07 NOTE — ED Triage Notes (Signed)
BIB EMS from home with c/o right leg pain and right lower back pain started this am. Pt reports was on couch and unable to move and states she has had a pinched nerve in the past and this is a similar feeling. Pt has hx of head and neck cancer. Procedure done yesterday 05/06/20  to remove spot from tongue. Slurred speech is normal. No other complaints.  V/S  BP: 152/100 P-81 98% room air CBG-98 T-99.5

## 2020-05-07 NOTE — ED Provider Notes (Signed)
[facility] DEPT Provider Note   CSN: [id number] Arrival date & time: 05/07/20  1143     History Chief Complaint  Patient presents with  . Numbness    Kristen [name] is a 55 y.o. female.  HPI   Pt is a 55 y/o female hypertension, GERD, thrombocytopenia, adenoid cystic carcinoma of the head/neck, EtOH use who presents to the ED today for eval of numbness.  States that when she woke up this morning she could not move her entire body from the neck down.  She reports this lasted for several hours until she was able to call EMS to come get her up.  She was able to stand and walk assisted with EMS.  Her symptoms are now resolved.  Triage note states that patient came into the ED complaining of right leg and lower back pain and that this was why she came to the ED however she denies that this is the reason she came to the ED.  She does still have his chronic pain but has been taking her home meds.  Pt denies any numbness/tingling/weakness to the BLE. Denies saddle anesthesia. Denies loss of control of bowels or bladder. No urinary retention. No fevers. Denies a h/o IVDU. Denies a h/o CA or recent unintended weight loss.  Pt has been in remission since 2018 for head/neck CA.   Past Medical History:  Diagnosis Date  . Allergy   . Anemia    Iron deficiency  . Anxiety   . Arthritis    back, left shoulder  . Blood transfusion without reported diagnosis   . Chicken pox   . Depression   . Diarrhea    takes Imodium daily  . GERD (gastroesophageal reflux disease)   . H/O hiatal hernia   . Headache(784.0)   . History of kidney stones    1996ish  . History of radiation therapy 11/16/16- 01/01/17   Base of Tongue/ 66 gy in 33 fractions/ Dose: 2 Gy  . Hypertension   . Iron deficiency anemia 12/11/2018  . Migraine    none for 5 years (as of 01/13/16)  . OSA (obstructive sleep apnea) 10/13/2015   unable to get cpap, plans to get one in 2018  . Pneumonia    . Restless legs   . Scoliosis   . Shingles 08/28/2013  . Shortness of breath    with exertion  . Sleep apnea   . Tongue cancer ([place])    tongue cancer    Patient Active Problem List   Diagnosis Date Noted  . Alcohol use 08/05/2019  . Bilateral lower extremity edema 08/05/2019  . Peripheral edema 03/12/2019  . Iron deficiency anemia 12/11/2018  . Loosening of hardware in spine ([place])   . Other spondylosis with radiculopathy, lumbar region   . History of lumbar spinal fusion 11/06/2018  . UTI (urinary tract infection) 05/15/2017  . 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 Malignant neoplasm of base of tongue ([place]) 10/29/2016  . 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 Headache associated with sexual activity 05/14/2016  . Tongue lesion 05/14/2016  . Thrombocytopenia ([place]) 01/22/2016  . Chronic diarrhea 01/22/2016  . Chest pain   . Essential hypertension   . Spinal stenosis of lumbar region 01/20/2016    Class: Chronic  . DDD (degenerative disc disease), lumbar 01/20/2016    Class: Chronic  . Spinal stenosis of lumbar region with neurogenic claudication 01/20/2016  . Low back  pain 12/22/2015  . Hypersomnolence 10/13/2015  . Muscle cramp 08/01/2015  . GERD (gastroesophageal reflux disease) 07/09/2015  . Morbid obesity with BMI of 40.0-44.9, adult ([place]) 01/08/2013    Past Surgical History:  Procedure Laterality Date  . BACK SURGERY  07/21/1978  . CHOLECYSTECTOMY N/A 08/15/2013   Procedure: LAPAROSCOPIC CHOLECYSTECTOMY WITH INTRAOPERATIVE CHOLANGIOGRAM;  Surgeon: Gwenyth Ober, MD;  Location: [facility];  Service: General;  Laterality: N/A;  . COLONOSCOPY N/A 01/09/2013   Procedure: COLONOSCOPY;  Surgeon: Beryle Beams, MD;  Location: [facility];  Service: Endoscopy;  Laterality: N/A;  . COLONOSCOPY  03/2018  . DIRECT LARYNGOSCOPY  07/2016   Dr. Nicolette Bang [facility]  . ESOPHAGOGASTRODUODENOSCOPY  03/2018  . ESOPHAGOGASTRODUODENOSCOPY (EGD) WITH  PROPOFOL N/A 04/24/2019   Procedure: ESOPHAGOGASTRODUODENOSCOPY (EGD) WITH PROPOFOL;  Surgeon: Gatha Mayer, MD;  Location: [facility];  Service: Endoscopy;  Laterality: N/A;  . GASTROSTOMY TUBE PLACEMENT  09/27/2016  . GIVENS CAPSULE STUDY N/A 04/24/2019   Procedure: GIVENS CAPSULE STUDY;  Surgeon: Gatha Mayer, MD;  Location: [facility];  Service: Endoscopy;  Laterality: N/A;  . HERNIA REPAIR Left 1981  . IR PATIENT EVAL TECH 0-60 MINS  12/13/2016  . IR PATIENT EVAL TECH 0-60 MINS  04/11/2017  . IR PATIENT EVAL TECH 0-60 MINS  03/24/2018  . IR PATIENT EVAL TECH 0-60 MINS  11/23/2018  . IR PATIENT EVAL TECH 0-60 MINS  05/28/2019  . IR REPLACE G-TUBE SIMPLE WO FLUORO  12/12/2017  . IR REPLACE G-TUBE SIMPLE WO FLUORO  03/29/2018  . IR REPLACE G-TUBE SIMPLE WO FLUORO  06/29/2018  . IR REPLACE G-TUBE SIMPLE WO FLUORO  11/02/2018  . IR REPLACE G-TUBE SIMPLE WO FLUORO  03/08/2019  . IR REPLACE G-TUBE SIMPLE WO FLUORO  07/02/2019  . IR REPLACE G-TUBE SIMPLE WO FLUORO  11/30/2019  . IR REPLACE G-TUBE SIMPLE WO FLUORO  12/27/2019  . MODIFIED RADICAL NECK DISSECTION Left 09/27/2016   Levels 1 & 2  . PARTIAL GLOSSECTOMY Left 09/27/2016   Left hemi partial glossectomy  . SPINE SURGERY  01/20/2016   fusion  . TONSILLECTOMY    . tracheotomy  09/27/2016  . TUBAL LIGATION  06/1988     OB History   No obstetric history on file.     Family History  Problem Relation Age of Onset  . Heart disease Father   . Heart attack Father   . Hypertension Mother   . Arthritis Mother   . Diabetes Maternal Grandmother   . Diabetes Paternal Grandmother   . Diabetes Maternal Uncle   . Colon polyps Neg Hx   . Colon cancer Neg Hx   . Esophageal cancer Neg Hx   . Stomach cancer Neg Hx   . Rectal cancer Neg Hx     Social History   Tobacco Use  . Smoking status: Never Smoker  . Smokeless tobacco: Never Used  Vaping Use  . Vaping Use: Never used  Substance Use Topics  . Alcohol use: Yes     Alcohol/week: 7.0 standard drinks    Types: 7 Cans of beer per week  . Drug use: No    Home Medications Prior to Admission medications   Medication Sig Start Date End Date Taking? Authorizing Provider  amLODipine (NORVASC) 10 MG tablet TAKE 1 TABLET BY MOUTH EVERY DAY Patient taking differently: Place 10 mg into feeding tube daily. 07/03/19  Yes [name], MD  amphetamine-dextroamphetamine (ADDERALL) 10 MG tablet 1-3 daily as needed for sleepiness Patient  taking differently: Place 10 mg into feeding tube 2 (two) times daily. 09/10/19  Yes Young, Tarri Fuller D, MD  aspirin 81 MG chewable tablet Place 81 mg into feeding tube daily.  10/05/16  Yes [provider]  baclofen (LIORESAL) 10 MG tablet PLACE 1 TABLET VIA FEEDING TUBE THREE TIMES DAILY Patient taking differently: Place 10 mg into feeding tube 2 (two) times daily. 12/07/19  Yes Jessy Oto, MD  Calcium Citrate 200 MG TABS Place 200 mg into feeding tube daily at 12 noon.   Yes [provider]  clotrimazole (MYCELEX) 10 MG troche Take 10 mg by mouth daily as needed (infection). 05/06/20  Yes [provider]  DEXILANT 60 MG capsule Place 60 capsules into feeding tube daily. 08/22/19  Yes [provider]  diclofenac (VOLTAREN) 75 MG EC tablet TAKE 1 TABLET BY MOUTH TWICE DAILY WITH FOOD AS NEEDED FOR PAIN Patient taking differently: Take 75 mg by mouth 2 (two) times daily. Per tube 01/30/20  Yes Jessy Oto, MD  diclofenac Sodium (VOLTAREN) 1 % GEL Apply 2 g topically 4 (four) times daily. Patient taking differently: Apply 2 g topically 3 (three) times daily as needed (Pain). 12/07/18  Yes Jessy Oto, MD  diphenoxylate-atropine (LOMOTIL) 2.5-0.025 MG tablet PLACE 2 TABLETS INTO FEEDING TUBE TWICE DAILY Patient taking differently: Place 1 tablet into feeding tube 2 (two) times daily. 10/24/19  Yes Gatha Mayer, MD  ferrous sulfate 220 (44 Fe) MG/5ML solution PLACE 6.8MLS(300MG) INTO FEEDING TUBE  DAILY Patient taking differently: Place 220 mg into feeding tube daily. 11/16/19  Yes Gatha Mayer, MD  folic acid (FOLVITE) 354 MCG tablet Place 400 mcg into feeding tube daily.   Yes [provider]  hydrocortisone cream 1 % APPLY EXTERNALLY TO THE AFFECTED AREA TWICE DAILY Patient taking differently: Apply 1 application topically 2 (two) times daily as needed for itching. 06/16/18  Yes [name], MD  magic mouthwash w/lidocaine SOLN Swish and or swallow 10 mL up to QID prn. Patient taking differently: Take 10 mLs by mouth daily. Swish and or swallow 10 mL up to QID prn. 03/28/20  Yes Eppie Gibson, MD  meloxicam (MOBIC) 15 MG tablet TAKE 1 TABLET(15 MG) BY MOUTH DAILY Patient taking differently: Place 15 mg into feeding tube daily. 04/14/20  Yes Jessy Oto, MD  Multiple Vitamin (MULTIVITAMIN WITH MINERALS) TABS tablet Place 1 tablet into feeding tube daily. Patient taking differently: Place 1 tablet into feeding tube daily. Centrum 11/10/18  Yes Jessy Oto, MD  potassium chloride SA (KLOR-CON) 20 MEQ tablet TAKE 1 TABLET BY MOUTH TWICE DAILY FOR THE NEXT 3 DAYS. OKAY TO CRUSH IF UNABLE TO SWALLOW. CAN ALSO TAKE PER TUBE IF NEEDED. Patient taking differently: Take 20 mEq by mouth daily. Per tube 08/13/19  Yes [name], MD  pregabalin (LYRICA) 75 MG capsule TAKE 1 CAPSULE(75 MG) BY MOUTH TWICE DAILY Patient taking differently: Place 75 mg into feeding tube 2 (two) times daily. 04/14/20  Yes Jessy Oto, MD  spironolactone (ALDACTONE) 25 MG tablet Place 25 mg into feeding tube 2 (two) times daily. 04/16/20  Yes [provider]  traMADol (ULTRAM) 50 MG tablet TAKE 1 TABLET(50 MG) BY MOUTH EVERY 6 HOURS AS NEEDED Patient taking differently: Place 50 mg into feeding tube daily. 04/14/20  Yes Jessy Oto, MD  Elastic Bandages & Supports (JOBST KNEE HIGH COMPRESSION SM) MISC 1 Units by Does not apply route daily. 08/24/19  Jessy Oto, MD  fluconazole  (DIFLUCAN) 100 MG tablet Take 2 tablets today, then 1 tablet daily x 13 more days. Patient not taking: No sig reported 03/28/20   Eppie Gibson, MD  SUMAtriptan (IMITREX) 100 MG tablet Take 0.5-1 tablet by mouth at the onset of migraine and may repeat in 2 hours if headache persists or recurs. Patient not taking: Reported on 05/07/2020 11/09/18   Jessy Oto, MD    Allergies    Elemental sulfur and Other  Review of Systems   Review of Systems  Constitutional: Negative for fever.  HENT: Negative for ear pain and sore throat.   Eyes: Negative for visual disturbance.  Respiratory: Negative for cough and shortness of breath.   Cardiovascular: Negative for chest pain.  Gastrointestinal: Negative for abdominal pain, blood in stool, constipation, diarrhea, nausea and vomiting.  Genitourinary: Negative for dysuria, flank pain, frequency, hematuria and urgency.  Musculoskeletal: Positive for back pain. Negative for gait problem.  Skin: Negative for rash.  Neurological: Positive for weakness and numbness. Negative for dizziness, light-headedness and headaches.  All other systems reviewed and are negative.   Physical Exam Updated Vital Signs BP (!) 117/91   Pulse 64   Temp 98.2 F (36.8 C) (Oral)   Resp 11   Ht 5' 9"  (1.753 m)   Wt 80.9 kg   LMP 11/15/2013   SpO2 97%   BMI 26.34 kg/m   Physical Exam Vitals and nursing note reviewed.  Constitutional:      General: She is not in acute distress.    Appearance: She is well-developed.  HENT:     Head: Normocephalic and atraumatic.  Eyes:     Extraocular Movements: Extraocular movements intact.     Conjunctiva/sclera: Conjunctivae normal.     Pupils: Pupils are equal, round, and reactive to light.  Cardiovascular:     Rate and Rhythm: Normal rate and regular rhythm.     Heart sounds: Normal heart sounds. No murmur heard.   Pulmonary:     Effort: Pulmonary effort is normal. No respiratory distress.     Breath sounds: Normal  breath sounds. No wheezing, rhonchi or rales.  Abdominal:     General: Bowel sounds are normal.     Palpations: Abdomen is soft.     Tenderness: There is no abdominal tenderness. There is no guarding or rebound.  Musculoskeletal:     Cervical back: Neck supple.     Comments: No midline cervical spine ttp  Skin:    General: Skin is warm and dry.  Neurological:     Mental Status: She is alert.     Comments: Mental Status:  Alert, thought content appropriate, able to give a coherent history. Speech fluent without evidence of aphasia. Able to follow 2 step commands without difficulty.  Cranial Nerves:  II:  Peripheral visual fields grossly normal, pupils equal, round, reactive to light III,IV, VI: ptosis not present, extra-ocular motions intact bilaterally  V,VII: smile symmetric, facial light touch sensation equal VIII: hearing grossly normal to voice  X: uvula elevates symmetrically  XI: bilateral shoulder shrug symmetric and strong XII: midline tongue extension without fassiculations Motor:  Normal tone. 5/5 strength of BUE and BLE major muscle groups including strong and equal grip strength and dorsiflexion/plantar flexion Sensory: light touch normal in all extremities.      ED Results / Procedures / Treatments   Labs (all labs ordered are listed, but only abnormal results are displayed) Labs Reviewed  COMPREHENSIVE METABOLIC PANEL -  Abnormal; Notable for the following components:      Result Value   Potassium 3.2 (*)    Total Bilirubin 1.5 (*)    All other components within normal limits  URINALYSIS, ROUTINE W REFLEX MICROSCOPIC - Abnormal; Notable for the following components:   Ketones, ur 20 (*)    Leukocytes,Ua MODERATE (*)    Bacteria, UA RARE (*)    All other components within normal limits  URINE CULTURE  CBC WITH DIFFERENTIAL/PLATELET  RAPID URINE DRUG SCREEN, HOSP PERFORMED    EKG None  Radiology CT Head Wo Contrast  Result Date: 05/07/2020 CLINICAL  DATA:  Right leg pain EXAM: CT HEAD WITHOUT CONTRAST TECHNIQUE: Contiguous axial images were obtained from the base of the skull through the vertex without intravenous contrast. COMPARISON:  MRI 07/22/2017, CT brain 07/22/2017 FINDINGS: Brain: No evidence of acute infarction, hemorrhage, hydrocephalus, extra-axial collection or mass lesion/mass effect. Vascular: No hyperdense vessel or unexpected calcification. Skull: Normal. Negative for fracture or focal lesion. Sinuses/Orbits: No acute finding. Other: None IMPRESSION: Negative non contrasted CT appearance of the brain. Electronically Signed   By: Donavan Foil M.D.   On: 05/07/2020 16:02   CT Soft Tissue Neck W Contrast  Result Date: 05/07/2020 CLINICAL DATA:  Head neck cancer surveillance. History of tongue cancer with partial glossectomy 2018 EXAM: CT NECK WITH CONTRAST TECHNIQUE: Multidetector CT imaging of the neck was performed using the standard protocol following the bolus administration of intravenous contrast. CONTRAST:  26m OMNIPAQUE IOHEXOL 300 MG/ML  SOLN COMPARISON:  CT neck 09/01/2018 FINDINGS: Pharynx and larynx: Partial glossectomy on the left, unchanged. Surgical clip in the central tongue. No recurrent mass in the tongue. Normal airway. Mild soft tissue thickening of the larynx and epiglottis due to prior radiation change no airway compromise. Salivary glands: Negative parotid bilaterally. Right submandibular gland normal. Left submandibular gland resected. Thyroid: Subcentimeter thyroid nodules bilaterally. 3 mm left thyroid calcification unchanged. No further imaging necessary. Lymph nodes: Left neck lymph node dissection with surgical clips in the left upper neck. No enlarged lymph nodes in the neck. Vascular: Normal vascular enhancement. Occlusion of the proximal left jugular vein which is likely resected. Normal right jugular vein. Carotid artery patent bilaterally. Limited intracranial: Negative Visualized orbits: Not imaged Mastoids  and visualized paranasal sinuses: Paranasal sinuses clear. Negative mastoid. Skeleton: Cervical spondylosis. Foraminal narrowing bilaterally due to spurring and C5-6. No acute skeletal abnormality. Upper chest: Lung apices clear bilaterally. Other: None IMPRESSION: No acute abnormality neck.  No recurrent tumor Postop left hemiglossectomy and lymph node dissection. Electronically Signed   By: CFranchot GalloM.D.   On: 05/07/2020 16:04    Procedures Procedures   Medications Ordered in ED Medications  potassium chloride (KLOR-CON) packet 40 mEq (40 mEq Per Tube Given 05/07/20 1542)  iohexol (OMNIPAQUE) 300 MG/ML solution 75 mL (75 mLs Intravenous Contrast Given 05/07/20 1509)    ED Course  I have reviewed the triage vital signs and the nursing notes.  Pertinent labs & imaging results that were available during my care of the patient were reviewed by me and considered in my medical decision making (see chart for details).    MDM Rules/Calculators/A&P                          55y/o F presents for eval of generalized numbness/weakness that occurred PTA and is now resolved.   Reviewed/interpreted labs CBC unremarkable CMP with mild hypokalemia, this was supplemented UA with  ketonuria, moderate leuks, 0-5 rbc, 21-50 wbc, and rare bacteria. Will tx for uti. Culture sent UDS neg  CT head  Negative non contrasted CT appearance of the brain CT cervical spine - No acute abnormality neck.  No recurrent tumor Postop left hemiglossectomy and lymph node dissection.   4:48 PM CONSULT with Dr. Malen Gauze with neurology who states that patient likely needs MRI but given that symptoms have completely resolved she likely can proceed to get this as an outpatient.  Recommended treating her for UTI and having her follow-up closely with her PCP.  Reassessed patient.  She continues to feel back to baseline.  She is ambulated in the ED with no difficulty.  We discussed all results and plan for treatment.  UTI and  give her an ambulatory referral to neurology.  Have also advised that she follow-up with her PCP and return to the ED for any worsening symptoms.  She voices understanding of plan and reasons to return.  All questions answered.  Patient stable for discharge.  Final Clinical Impression(s) / ED Diagnoses Final diagnoses:  Numbness  Acute cystitis without hematuria    Rx / DC Orders ED Discharge Orders         Ordered    Ambulatory referral to Neurology       Comments: An appointment is requested in approximately: 2 weeks   05/07/20 1702           Bishop Dublin 05/07/20 1702    Isla Pence, MD 05/07/20 1711

## 2020-05-07 NOTE — Discharge Instructions (Signed)
You were given a prescription for antibiotics. Please take the antibiotic prescription fully.   You were given an ambulatory referral to neurology.  They will contact you to set up an appointment for follow-up  Please follow up with your primary care provider within 5-7 days for re-evaluation of your symptoms. If you do not have a primary care provider, information for a healthcare clinic has been provided for you to make arrangements for follow up care. Please return to the emergency department for any new or worsening symptoms.

## 2020-05-07 NOTE — ED Notes (Signed)
Pt was able to walk by herself, she did great.

## 2020-05-08 ENCOUNTER — Ambulatory Visit (INDEPENDENT_AMBULATORY_CARE_PROVIDER_SITE_OTHER): Payer: BC Managed Care – PPO | Admitting: Specialist

## 2020-05-08 ENCOUNTER — Encounter: Payer: Self-pay | Admitting: Specialist

## 2020-05-08 VITALS — BP 111/79 | HR 63 | Ht 69.0 in | Wt 178.3 lb

## 2020-05-08 DIAGNOSIS — R29898 Other symptoms and signs involving the musculoskeletal system: Secondary | ICD-10-CM | POA: Diagnosis not present

## 2020-05-08 DIAGNOSIS — M4325 Fusion of spine, thoracolumbar region: Secondary | ICD-10-CM

## 2020-05-08 DIAGNOSIS — M4712 Other spondylosis with myelopathy, cervical region: Secondary | ICD-10-CM | POA: Diagnosis not present

## 2020-05-08 DIAGNOSIS — M4316 Spondylolisthesis, lumbar region: Secondary | ICD-10-CM | POA: Diagnosis not present

## 2020-05-08 DIAGNOSIS — Z981 Arthrodesis status: Secondary | ICD-10-CM

## 2020-05-08 DIAGNOSIS — T84498A Other mechanical complication of other internal orthopedic devices, implants and grafts, initial encounter: Secondary | ICD-10-CM

## 2020-05-08 NOTE — Patient Instructions (Signed)
Avoid overhead lifting and overhead use of the arms. Do not lift greater than 5 lbs. Adjust head rest in vehicle to prevent hyperextension if rear ended. Take extra precautions to avoid falling, including use of a cane if you feel weak. MRI of the cervical spine is ordered, due to history of tongue ca I will order with and without contrast.

## 2020-05-08 NOTE — Progress Notes (Signed)
Office Visit Note   Patient: Kristen [name]           Date of Birth: 1965/07/27           MRN: [id number] Visit Date: 05/08/2020              Requested by: [name], MD [place],  [place] [address] PCP: [name], MD   Assessment & Plan: Visit Diagnoses:  1. Upper extremity weakness   2. Weakness of both lower extremities   3. Other spondylosis with myelopathy, cervical region   4. Fusion of spine of thoracolumbar region   5. Spondylolisthesis of lumbar region   6. S/P lumbar fusion   7. Loosening of hardware in spine [facility])     Plan: Avoid overhead lifting and overhead use of the arms. Do not lift greater than 5 lbs. Adjust head rest in vehicle to prevent hyperextension if rear ended. Take extra precautions to avoid falling, including use of a cane if you feel weak. MRI of the cervical spine is ordered, due to history of tongue ca I will order with and without contrast.  Follow-Up Instructions: No follow-ups on file.   Orders:  Orders Placed This Encounter  Procedures  . MR CERVICAL SPINE W WO CONTRAST   No orders of the defined types were placed in this encounter.     Procedures: No procedures performed   Clinical Data: No additional findings.   Subjective: Chief Complaint  Patient presents with  . Lower Back - Follow-up    55 year old female with history of thoracolumbar fusion scoliosis at age 34. Has had increasing back and leg pain with a persistent lumbar scoliosis below her thoracolumbar fusion. She has Undergone an extension of her fusion to the L3-4 and L4-5 levels. She has no bowel or bladder difficulty. No gait disturbance. Underwent an ESI right L3-4 with improvement that was somewhat Helpful in her words. Still complains of persistent back and right leg pain the whole right leg down into her right foot. She reports that yesterday she fell asleep sitting and awoke lifting her head she  Couldn't move either arm  or leg. She was taken by ambulance to the [facility] and there she had CT of the brain and cervical spine. These were with no acute findings and she is referred to a neurologist for evaluation. Review of the studies shows a focal area of narrowing of the cervical spinal canal at the C5-6 level on sagittal imaging but axial is not as impressive with nearly 10 mm  AP diameter of the canal at that level. The C6 neuroforamen are narrowed. She has some numbness into hands the whole hands and complains of dropping items with the hands. There is no pain today at first yesterday the neck was painful and she felt a posterior headache.    Review of Systems  Constitutional: Negative.   HENT: Negative.   Eyes: Negative.   Respiratory: Negative.   Cardiovascular: Negative.   Gastrointestinal: Negative.   Endocrine: Negative.   Genitourinary: Negative.   Musculoskeletal: Negative.   Skin: Negative.   Allergic/Immunologic: Negative.   Neurological: Negative.   Hematological: Negative.   Psychiatric/Behavioral: Negative.      Objective: Vital Signs: BP 111/79   Pulse 63   Ht 5' 9"  (1.753 m)   Wt 178 lb 5.6 oz (80.9 kg)   LMP 11/15/2013   BMI 26.34 kg/m   Physical Exam Constitutional:  Appearance: She is well-developed.  HENT:     Head: Normocephalic and atraumatic.  Eyes:     Pupils: Pupils are equal, round, and reactive to light.  Pulmonary:     Effort: Pulmonary effort is normal.     Breath sounds: Normal breath sounds.  Abdominal:     General: Bowel sounds are normal.     Palpations: Abdomen is soft.  Musculoskeletal:     Cervical back: Normal range of motion and neck supple.  Skin:    General: Skin is warm and dry.  Neurological:     Mental Status: She is alert and oriented to person, place, and time.  Psychiatric:        Behavior: Behavior normal.        Thought Content: Thought content normal.        Judgment: Judgment normal.     Back Exam   Tenderness  The patient is  experiencing tenderness in the lumbar.  Range of Motion  Extension: abnormal  Flexion: abnormal  Lateral bend right: abnormal  Lateral bend left: abnormal  Rotation right: abnormal  Rotation left: abnormal   Muscle Strength  Right Quadriceps:  5/5  Left Quadriceps:  5/5  Right Hamstrings:  5/5  Left Hamstrings:  5/5   Reflexes  Patellar: 0/4 Achilles: 0/4 Babinski's sign: normal   Other  Toe walk: abnormal Heel walk: abnormal Sensation: normal Gait: normal   Comments:  Hoffman's sign negative No focal motor deficit in either UE.       Specialty Comments:  No specialty comments available.  Imaging: CT Head Wo Contrast  Result Date: 05/07/2020 CLINICAL DATA:  Right leg pain EXAM: CT HEAD WITHOUT CONTRAST TECHNIQUE: Contiguous axial images were obtained from the base of the skull through the vertex without intravenous contrast. COMPARISON:  MRI 07/22/2017, CT brain 07/22/2017 FINDINGS: Brain: No evidence of acute infarction, hemorrhage, hydrocephalus, extra-axial collection or mass lesion/mass effect. Vascular: No hyperdense vessel or unexpected calcification. Skull: Normal. Negative for fracture or focal lesion. Sinuses/Orbits: No acute finding. Other: None IMPRESSION: Negative non contrasted CT appearance of the brain. Electronically Signed   By: Donavan Foil M.D.   On: 05/07/2020 16:02   CT Soft Tissue Neck W Contrast  Result Date: 05/07/2020 CLINICAL DATA:  Head neck cancer surveillance. History of tongue cancer with partial glossectomy 2018 EXAM: CT NECK WITH CONTRAST TECHNIQUE: Multidetector CT imaging of the neck was performed using the standard protocol following the bolus administration of intravenous contrast. CONTRAST:  49m OMNIPAQUE IOHEXOL 300 MG/ML  SOLN COMPARISON:  CT neck 09/01/2018 FINDINGS: Pharynx and larynx: Partial glossectomy on the left, unchanged. Surgical clip in the central tongue. No recurrent mass in the tongue. Normal airway. Mild soft tissue  thickening of the larynx and epiglottis due to prior radiation change no airway compromise. Salivary glands: Negative parotid bilaterally. Right submandibular gland normal. Left submandibular gland resected. Thyroid: Subcentimeter thyroid nodules bilaterally. 3 mm left thyroid calcification unchanged. No further imaging necessary. Lymph nodes: Left neck lymph node dissection with surgical clips in the left upper neck. No enlarged lymph nodes in the neck. Vascular: Normal vascular enhancement. Occlusion of the proximal left jugular vein which is likely resected. Normal right jugular vein. Carotid artery patent bilaterally. Limited intracranial: Negative Visualized orbits: Not imaged Mastoids and visualized paranasal sinuses: Paranasal sinuses clear. Negative mastoid. Skeleton: Cervical spondylosis. Foraminal narrowing bilaterally due to spurring and C5-6. No acute skeletal abnormality. Upper chest: Lung apices clear bilaterally. Other: None IMPRESSION: No acute abnormality neck.  No recurrent tumor Postop left hemiglossectomy and lymph node dissection. Electronically Signed   By: Franchot Gallo M.D.   On: 05/07/2020 16:04     PMFS History: Patient Active Problem List   Diagnosis Date Noted  . Spinal stenosis of lumbar region 01/20/2016    Priority: High    Class: Chronic  . DDD (degenerative disc disease), lumbar 01/20/2016    Priority: High    Class: Chronic  . Alcohol use 08/05/2019  . Bilateral lower extremity edema 08/05/2019  . Peripheral edema 03/12/2019  . Iron deficiency anemia 12/11/2018  . Loosening of hardware in spine ([place])   . Other spondylosis with radiculopathy, lumbar region   . History of lumbar spinal fusion 11/06/2018  . UTI (urinary tract infection) 05/15/2017  . 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 Malignant neoplasm of base of tongue ([place]) 10/29/2016  . 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[place]) 10/29/2016  . Headache associated with sexual  activity 05/14/2016  . Tongue lesion 05/14/2016  . Thrombocytopenia ([place]) 01/22/2016  . Chronic diarrhea 01/22/2016  . Chest pain   . Essential hypertension   . Spinal stenosis of lumbar region with neurogenic claudication 01/20/2016  . Low back pain 12/22/2015  . Hypersomnolence 10/13/2015  . Muscle cramp 08/01/2015  . GERD (gastroesophageal reflux disease) 07/09/2015  . Morbid obesity with BMI of 40.0-44.9, adult ([place]) 01/08/2013   Past Medical History:  Diagnosis Date  . Allergy   . Anemia    Iron deficiency  . Anxiety   . Arthritis    back, left shoulder  . Blood transfusion without reported diagnosis   . Chicken pox   . Depression   . Diarrhea    takes Imodium daily  . GERD (gastroesophageal reflux disease)   . H/O hiatal hernia   . Headache(784.0)   . History of kidney stones    1996ish  . History of radiation therapy 11/16/16- 01/01/17   Base of Tongue/ 66 gy in 33 fractions/ Dose: 2 Gy  . Hypertension   . Iron deficiency anemia 12/11/2018  . Migraine    none for 5 years (as of 01/13/16)  . OSA (obstructive sleep apnea) 10/13/2015   unable to get cpap, plans to get one in 2018  . Pneumonia   . Restless legs   . Scoliosis   . Shingles 08/28/2013  . Shortness of breath    with exertion  . Sleep apnea   . Tongue cancer ([place])    tongue cancer    Family History  Problem Relation Age of Onset  . Heart disease Father   . Heart attack Father   . Hypertension Mother   . Arthritis Mother   . Diabetes Maternal Grandmother   . Diabetes Paternal Grandmother   . Diabetes Maternal Uncle   . Colon polyps Neg Hx   . Colon cancer Neg Hx   . Esophageal cancer Neg Hx   . Stomach cancer Neg Hx   . Rectal cancer Neg Hx     Past Surgical History:  Procedure Laterality Date  . BACK SURGERY  07/21/1978  . CHOLECYSTECTOMY N/A 08/15/2013   Procedure: LAPAROSCOPIC CHOLECYSTECTOMY WITH INTRAOPERATIVE CHOLANGIOGRAM;  Surgeon: Gwenyth Ober, MD;  Location: [place];  Service:  General;  Laterality: N/A;  . COLONOSCOPY N/A 01/09/2013   Procedure: COLONOSCOPY;  Surgeon: Beryle Beams, MD;  Location: [place];  Service: Endoscopy;  Laterality: N/A;  . COLONOSCOPY  03/2018  . DIRECT LARYNGOSCOPY  07/2016  Dr. Nicolette Bang [facility]  . ESOPHAGOGASTRODUODENOSCOPY  03/2018  . ESOPHAGOGASTRODUODENOSCOPY (EGD) WITH PROPOFOL N/A 04/24/2019   Procedure: ESOPHAGOGASTRODUODENOSCOPY (EGD) WITH PROPOFOL;  Surgeon: Gatha Mayer, MD;  [facility];  Service: Endoscopy;  Laterality: N/A;  . GASTROSTOMY TUBE PLACEMENT  09/27/2016  . GIVENS CAPSULE STUDY N/A 04/24/2019   Procedure: GIVENS CAPSULE STUDY;  Surgeon: Gatha Mayer, MD;  [facility];  Service: Endoscopy;  Laterality: N/A;  . HERNIA REPAIR Left 1981  . IR PATIENT EVAL TECH 0-60 MINS  12/13/2016  . IR PATIENT EVAL TECH 0-60 MINS  04/11/2017  . IR PATIENT EVAL TECH 0-60 MINS  03/24/2018  . IR PATIENT EVAL TECH 0-60 MINS  11/23/2018  . IR PATIENT EVAL TECH 0-60 MINS  05/28/2019  . IR REPLACE G-TUBE SIMPLE WO FLUORO  12/12/2017  . IR REPLACE G-TUBE SIMPLE WO FLUORO  03/29/2018  . IR REPLACE G-TUBE SIMPLE WO FLUORO  06/29/2018  . IR REPLACE G-TUBE SIMPLE WO FLUORO  11/02/2018  . IR REPLACE G-TUBE SIMPLE WO FLUORO  03/08/2019  . IR REPLACE G-TUBE SIMPLE WO FLUORO  07/02/2019  . IR REPLACE G-TUBE SIMPLE WO FLUORO  11/30/2019  . IR REPLACE G-TUBE SIMPLE WO FLUORO  12/27/2019  . MODIFIED RADICAL NECK DISSECTION Left 09/27/2016   Levels 1 & 2  . PARTIAL GLOSSECTOMY Left 09/27/2016   Left hemi partial glossectomy  . SPINE SURGERY  01/20/2016   fusion  . TONSILLECTOMY    . tracheotomy  09/27/2016  . TUBAL LIGATION  06/1988   Social History   Occupational History  . Occupation: Air cabin crew  Tobacco Use  . Smoking status: Never Smoker  . Smokeless tobacco: Never Used  Vaping Use  . Vaping Use: Never used  Substance and Sexual Activity  . Alcohol use: Yes    Alcohol/week: 7.0 standard drinks    Types:  7 Cans of beer per week  . Drug use: No  . Sexual activity: Yes    Birth control/protection: Surgical

## 2020-05-09 LAB — URINE CULTURE: Culture: <10000 — AB

## 2020-05-20 DIAGNOSIS — R1312 Dysphagia, oropharyngeal phase: Secondary | ICD-10-CM | POA: Diagnosis not present

## 2020-05-23 ENCOUNTER — Other Ambulatory Visit (HOSPITAL_COMMUNITY): Payer: Self-pay | Admitting: Radiology

## 2020-05-23 DIAGNOSIS — R633 Feeding difficulties, unspecified: Secondary | ICD-10-CM

## 2020-05-27 ENCOUNTER — Other Ambulatory Visit: Payer: Self-pay

## 2020-05-27 ENCOUNTER — Ambulatory Visit (HOSPITAL_COMMUNITY)
Admission: RE | Admit: 2020-05-27 | Discharge: 2020-05-27 | Disposition: A | Discharge status: Home / Self Care | Payer: Medicare Other | Source: Ambulatory Visit | Attending: Radiology | Admitting: Radiology

## 2020-05-27 DIAGNOSIS — Z431 Encounter for attention to gastrostomy: Secondary | ICD-10-CM | POA: Insufficient documentation

## 2020-05-27 DIAGNOSIS — R633 Feeding difficulties, unspecified: Secondary | ICD-10-CM | POA: Diagnosis not present

## 2020-05-27 DIAGNOSIS — K9423 Gastrostomy malfunction: Secondary | ICD-10-CM | POA: Diagnosis not present

## 2020-05-27 HISTORY — PX: IR REPLACE G-TUBE SIMPLE WO FLUORO: IMG2323

## 2020-05-27 MED ORDER — SILVER NITRATE-POT NITRATE 75-25 % EX MISC
CUTANEOUS | Status: AC
  Administered 2020-05-27: 5
  Filled 2020-05-27: qty 10

## 2020-05-28 ENCOUNTER — Ambulatory Visit: Payer: Medicare Other | Attending: Otolaryngology | Admitting: Speech Pathology

## 2020-05-28 ENCOUNTER — Ambulatory Visit
Admission: RE | Admit: 2020-05-28 | Discharge: 2020-05-28 | Disposition: A | Discharge status: Home / Self Care | Payer: Medicare Other | Source: Ambulatory Visit | Attending: Specialist | Admitting: Specialist

## 2020-05-28 DIAGNOSIS — R471 Dysarthria and anarthria: Secondary | ICD-10-CM | POA: Diagnosis not present

## 2020-05-28 DIAGNOSIS — M50221 Other cervical disc displacement at C4-C5 level: Secondary | ICD-10-CM | POA: Diagnosis not present

## 2020-05-28 DIAGNOSIS — M4712 Other spondylosis with myelopathy, cervical region: Secondary | ICD-10-CM

## 2020-05-28 DIAGNOSIS — R29898 Other symptoms and signs involving the musculoskeletal system: Secondary | ICD-10-CM

## 2020-05-28 DIAGNOSIS — M4802 Spinal stenosis, cervical region: Secondary | ICD-10-CM | POA: Diagnosis not present

## 2020-05-28 DIAGNOSIS — Z85810 Personal history of malignant neoplasm of tongue: Secondary | ICD-10-CM | POA: Diagnosis not present

## 2020-05-28 DIAGNOSIS — M50222 Other cervical disc displacement at C5-C6 level: Secondary | ICD-10-CM | POA: Diagnosis not present

## 2020-05-28 DIAGNOSIS — R1312 Dysphagia, oropharyngeal phase: Secondary | ICD-10-CM | POA: Diagnosis not present

## 2020-05-28 MED ORDER — GADOBENATE DIMEGLUMINE 529 MG/ML IV SOLN
15.0000 mL | Freq: Once | INTRAVENOUS | Status: AC | PRN
  Administered 2020-05-28: 15 mL via INTRAVENOUS

## 2020-05-28 NOTE — Patient Instructions (Addendum)
   Use Aquaphor Advanced Therapy on your lips before bed and during the day - make sure you get the Advanced Therapy not the lip repair   Try drinking a product called Kefir - whole fat, or protein shakes in a blender  No talking or distractions with meals  Dr. Pearlie Oyster office and ask for a referral to [facility] for counseling

## 2020-05-29 ENCOUNTER — Encounter: Payer: Self-pay | Admitting: Speech Pathology

## 2020-05-30 NOTE — Therapy (Signed)
[facility] [address] [place], [place], [address] Phone: [phone]   Fax:  [phone]  Speech Language Pathology Evaluation  Patient Details  Name: Kristen [name] MRN: [id number] Date of Birth: 1966-01-16 Referring Provider (SLP): Francina Ames MD   Encounter Date: 05/28/2020   End of Session - 05/30/20 1139    Visit Number 1    Number of Visits 42    SLP Start Time 1100    SLP Stop Time  1142    SLP Time Calculation (min) 42 min    Activity Tolerance Patient tolerated treatment well           Past Medical History:  Diagnosis Date  . Allergy   . Anemia    Iron deficiency  . Anxiety   . Arthritis    back, left shoulder  . Blood transfusion without reported diagnosis   . Chicken pox   . Depression   . Diarrhea    takes Imodium daily  . GERD (gastroesophageal reflux disease)   . H/O hiatal hernia   . Headache(784.0)   . History of kidney stones    1996ish  . History of radiation therapy 11/16/16- 01/01/17   Base of Tongue/ 66 gy in 33 fractions/ Dose: 2 Gy  . Hypertension   . Iron deficiency anemia 12/11/2018  . Migraine    none for 5 years (as of 01/13/16)  . OSA (obstructive sleep apnea) 10/13/2015   unable to get cpap, plans to get one in 2018  . Pneumonia   . Restless legs   . Scoliosis   . Shingles 08/28/2013  . Shortness of breath    with exertion  . Sleep apnea   . Tongue cancer ([place])    tongue cancer    Past Surgical History:  Procedure Laterality Date  . BACK SURGERY  07/21/1978  . CHOLECYSTECTOMY N/A 08/15/2013   Procedure: LAPAROSCOPIC CHOLECYSTECTOMY WITH INTRAOPERATIVE CHOLANGIOGRAM;  Surgeon: Gwenyth Ober, MD;  Location: [place];  Service: General;  Laterality: N/A;  . COLONOSCOPY N/A 01/09/2013   Procedure: COLONOSCOPY;  Surgeon: Beryle Beams, MD;  Location: [place];  Service: Endoscopy;  Laterality: N/A;  . COLONOSCOPY  03/2018  . DIRECT LARYNGOSCOPY  07/2016    Dr. Nicolette Bang [facility]  . ESOPHAGOGASTRODUODENOSCOPY  03/2018  . ESOPHAGOGASTRODUODENOSCOPY (EGD) WITH PROPOFOL N/A 04/24/2019   Procedure: ESOPHAGOGASTRODUODENOSCOPY (EGD) WITH PROPOFOL;  Surgeon: Gatha Mayer, MD;  [facility];  Service: Endoscopy;  Laterality: N/A;  . GASTROSTOMY TUBE PLACEMENT  09/27/2016  . GIVENS CAPSULE STUDY N/A 04/24/2019   Procedure: GIVENS CAPSULE STUDY;  Surgeon: Gatha Mayer, MD;  [facility];  Service: Endoscopy;  Laterality: N/A;  . HERNIA REPAIR Left 1981  . IR PATIENT EVAL TECH 0-60 MINS  12/13/2016  . IR PATIENT EVAL TECH 0-60 MINS  04/11/2017  . IR PATIENT EVAL TECH 0-60 MINS  03/24/2018  . IR PATIENT EVAL TECH 0-60 MINS  11/23/2018  . IR PATIENT EVAL TECH 0-60 MINS  05/28/2019  . IR REPLACE G-TUBE SIMPLE WO FLUORO  12/12/2017  . IR REPLACE G-TUBE SIMPLE WO FLUORO  03/29/2018  . IR REPLACE G-TUBE SIMPLE WO FLUORO  06/29/2018  . IR REPLACE G-TUBE SIMPLE WO FLUORO  11/02/2018  . IR REPLACE G-TUBE SIMPLE WO FLUORO  03/08/2019  . IR REPLACE G-TUBE SIMPLE WO FLUORO  07/02/2019  . IR REPLACE G-TUBE SIMPLE WO FLUORO  11/30/2019  . IR REPLACE G-TUBE SIMPLE WO FLUORO  12/27/2019  .  IR REPLACE G-TUBE SIMPLE WO FLUORO  05/27/2020  . MODIFIED RADICAL NECK DISSECTION Left 09/27/2016   Levels 1 & 2  . PARTIAL GLOSSECTOMY Left 09/27/2016   Left hemi partial glossectomy  . SPINE SURGERY  01/20/2016   fusion  . TONSILLECTOMY    . tracheotomy  09/27/2016  . TUBAL LIGATION  06/1988    There were no vitals filed for this visit.       SLP Evaluation [facility] - 05/30/20 1131      SLP Visit Information   SLP Received On 05/28/20    Referring Provider (SLP) Francina Ames MD    Onset Date 4 years ago    Medical Diagnosis Oropharyngeal dysphagia      Subjective   Patient/Family Stated Goal "To be able to swallow better and get rid of my feeding tube"      General Information   HPI Pertinent medical/swallowing history include: Adenoid cystic carcinoma  of the left posterior tongue / tongue base s/p resection, selective neck dissection, trach, Gtube on 09/27/16. She underwent postop RT, completed in Dec 2018 in [place]. FEES 05/20/20" Pt presents with a mild oropharyngeal dysphagia for which she compensates well. Given her previous cancer txs (partial glossectomy and XRT) she has reduced lingual propulsion, base of tonge retraction, and anterior hyolaryngeal movement. She takes very small bites/sips at a time, even when cued to challenge herself with bigger volumes. Her bolus cohesion is reduced and posterior transit seems more passive particularly with thin liquids. She maintains good airway protection across consistencies. Recommended Dys 3, however pt prefers Dys 1-2, thin liquids, alternate solids and liquids. Kristen [name] has oral thrush, but does not tolerate the swish and swallow medication. She is unsure if she has taken Diflucan.    Mobility Status Walks independelty      Balance Screen   Has the patient fallen in the past 6 months No    Has the patient had a decrease in activity level because of a fear of falling?  Yes    Is the patient reluctant to leave their home because of a fear of falling?  Yes   reports balance is off and she "trips, but nothing is there"     Prior Functional Status   Cognitive/Linguistic Baseline Within functional limits    Type of Home House     Lives With --   mom   Vocation On disability      Oral Motor/Sensory Function   Overall Oral Motor/Sensory Function Impaired    Labial ROM Within Functional Limits    Lingual ROM --   reduced BOT on FEES   Lingual Symmetry Within Functional Limits    Velum Within Functional Limits      Motor Speech   Overall Motor Speech Appears within functional limits for tasks assessed                           SLP Education - 05/30/20 1138    Education Details Swallow strategies; vary the diet, get behavior health referral    Person(s) Educated Patient     Methods Explanation;Demonstration;Verbal cues;Handout    Comprehension Verbal cues required;Need further instruction            SLP Short Term Goals - 05/30/20 1152      SLP SHORT TERM GOAL #1   Title pt will complete HEP with rare min A over two sessions    Time 4    Period Weeks  Status New      SLP SHORT TERM GOAL #2   Title Pt will keep a food journal over 2 weeks with  rare min A to promote improved balanced diet and advanced consistency.    Time 4    Period Weeks    Status New      SLP SHORT TERM GOAL #3   Title Pt will add 2 new high protien/fat foods to her diet rotation over 3 weeks    Time 4    Period Weeks    Status New      SLP SHORT TERM GOAL #4   Title Pt will utilize 3 compensatory strategies (internal and environmental)  for dysarthira 18/20 sentence responses in structured task    Time 4    Period Weeks    Status New      SLP SHORT TERM GOAL #5   Title Pt will complete pt reported outcome survey for dysarthria    Time 2    Period Weeks    Status New            SLP Long Term Goals - 05/30/20 1155      SLP LONG TERM GOAL #1   Title pt will complete HEP with modified independence over three sessions     Time 8    Period Weeks    Status New      SLP LONG TERM GOAL #2   Title Pt will add 4 new foods (protien/healthy fat) to her diet rotation over 6 weeks    Time 8    Period Weeks      SLP LONG TERM GOAL #3   Title Pt will maintain food journal over 5 weeks to promote improved variety of diet and advance consisteny    Time 8    Period Weeks    Status New      SLP LONG TERM GOAL #4   Title Pt will improve score of communciation PRO by 2 points    Time 8    Period Weeks    Status New      SLP LONG TERM GOAL #5   Title Pt will carryover strategies for dysarthriaand vocal efficiency to be intelligilble in 15 minute conversation in mildly noisy environment    Time 8    Period Weeks    Status New            Plan - 05/30/20 1139     Clinical Impression Statement Kristen [name] is referred for outpt dysphagia therapy after her FEES at [facility], due to increased c/o dysphagia. She has oral thrush and rated odynaphagia 5/10 today with puree and water. Clinical swallow eval reveals no over s/s of aspiration, c/w FEES 05/20/20. PO trials of water via straw and puree only per pt request. She continues to take meds via PEG. At this time, Kristen [name] is eating 1x a day, with a limited diet of mac and cheese with small pieces of chicken, grits, rice, potatoes and ice crem. She does not tolerate the smell of Ensure or Boost. She is not supplementing PO diet with TF. I discussed adding [place] yogurt or whole mild kefir, however she has aversion to yogurt.  Oral care was addressed with the SLP who completed the FEES, however, Kristen [name] continues to use a wash cloth to clear her tongue. She reports taking "lozenges" for thrush, but is unclear if this medicated. I recommend skilled ST to improve efficiency of swallow and advance diet to promote nutrition  health and wellness. Today, Kristen [name] also c/o difficulty communciating in the communicty and with family and friends. She endorses vocal fatigue and lingual fatigue. She reports she shuts down due to communication difficulties. Today, she presented with mild dysarthria due to partial glossectomy and likely fibrosis from XRT Kristen [name] is judged to be 90% intelligilble in this quiet environment.  .  Of note, Kristen [name] was tearful throughout this evaluation, reporting depression due to a recent break up of long term relationship, having to move in with her mother unexecptedly and communication difficulites CSW recommended referral to [facility] - I am in agreement. Also, I recommend consult with RD.  On balance screen, Kristen [name] endorsed balance issues and reported "tripping" more frequently. Recommend PT eval   Speech Therapy Frequency 2x / week    Duration 8 weeks   17 visits    Treatment/Interventions Aspiration precaution training;Environmental controls;Cueing hierarchy;Pharyngeal strengthening exercises;Compensatory techniques;Functional tasks;Compensatory strategies;SLP instruction and feedback;Oral motor exercises;Patient/family education;Multimodal communcation approach;Internal/external aids;Trials of upgraded texture/liquids;Diet toleration management by SLP;Other (comment)   objective swallow study (FEES) as indicated   Potential to Achieve Goals Fair    Potential Considerations Previous level of function;Co-morbidities    SLP Home Exercise Plan Oral care    Consulted and Agree with Plan of Care Patient           Patient will benefit from skilled therapeutic intervention in order to improve the following deficits and impairments:   Dysphagia, oropharyngeal phase  Dysarthria and anarthria    Problem List Patient Active Problem List   Diagnosis Date Noted  . Alcohol use 08/05/2019  . Bilateral lower extremity edema 08/05/2019  . Peripheral edema 03/12/2019  . Iron deficiency anemia 12/11/2018  . Loosening of hardware in spine ([place])   . Other spondylosis with radiculopathy, lumbar region   . History of lumbar spinal fusion 11/06/2018  . UTI (urinary tract infection) 05/15/2017  . 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 Malignant neoplasm of base of tongue ([place]) 10/29/2016  . 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[place]) 10/29/2016  . Headache associated with sexual activity 05/14/2016  . Tongue lesion 05/14/2016  . Thrombocytopenia ([place]) 01/22/2016  . Chronic diarrhea 01/22/2016  . Chest pain   . Essential hypertension   . Spinal stenosis of lumbar region 01/20/2016    Class: Chronic  . DDD (degenerative disc disease), lumbar 01/20/2016    Class: Chronic  . Spinal stenosis of lumbar region with neurogenic claudication 01/20/2016  . Low back pain 12/22/2015  . Hypersomnolence 10/13/2015  . Muscle cramp 08/01/2015  . GERD  (gastroesophageal reflux disease) 07/09/2015  . Morbid obesity with BMI of 40.0-44.9, adult ([place]) 01/08/2013    Carleah Yablonski, Annye Rusk MS, CCC-SLP 05/30/2020, 12:10 PM  [facility] [address] [place], [place], [address] Phone: [phone]   Fax:  [phone]  Name: DAEJA HELDERMAN MRN: [id number] Date of Birth: 1965/05/11

## 2020-06-02 ENCOUNTER — Ambulatory Visit: Payer: Medicare Other | Admitting: Speech Pathology

## 2020-06-02 DIAGNOSIS — Z20822 Contact with and (suspected) exposure to covid-19: Secondary | ICD-10-CM | POA: Diagnosis not present

## 2020-06-04 ENCOUNTER — Other Ambulatory Visit: Payer: Self-pay

## 2020-06-04 ENCOUNTER — Ambulatory Visit: Payer: Medicare Other | Admitting: Speech Pathology

## 2020-06-04 ENCOUNTER — Encounter: Payer: Self-pay | Admitting: Speech Pathology

## 2020-06-04 DIAGNOSIS — R471 Dysarthria and anarthria: Secondary | ICD-10-CM

## 2020-06-04 DIAGNOSIS — R1312 Dysphagia, oropharyngeal phase: Secondary | ICD-10-CM | POA: Diagnosis not present

## 2020-06-04 NOTE — Patient Instructions (Addendum)
   Aquaphor intensive treatment (not the lip repair) before bed and after meals  Kefir  Shakes with  Whey protein powder  and fruits and vegetable  Coconut milk is a great source of fat (don't confuse it with coconut water or coconut cream)  You need to get more fruits and veggies in your diet  K and C make the same sound - you are affected by the vowels surrounded   C ordelia  C arl  C ourt  Say these slow with the "c"   [address] - very slow, making each sound distinct    SLOW LOUD OVER-ENNUNCIATE PAUSE  PA TA KA  PATA TAKA KAPA PATAKA  BUTTERCUP  CATERPILLAR  [place]  SLOW AND BIG - EXAGGERATE YOUR MOUTH, MAKE EACH CONSONANT

## 2020-06-04 NOTE — Therapy (Signed)
[facility] [address] [place], [place], [address] Phone: [phone]   Fax:  [phone]  Speech Language Pathology Treatment  Patient Details  Name: Kristen [name] MRN: [id number] Date of Birth: September 04, 1965 Referring Provider (SLP): Francina Ames MD   Encounter Date: 06/04/2020   End of Session - 06/04/20 1307    Visit Number 2    Number of Visits 17    Date for SLP Re-Evaluation 08/01/20    SLP Start Time 0932    SLP Stop Time  2831    SLP Time Calculation (min) 43 min    Activity Tolerance Patient tolerated treatment well           Past Medical History:  Diagnosis Date  . Allergy   . Anemia    Iron deficiency  . Anxiety   . Arthritis    back, left shoulder  . Blood transfusion without reported diagnosis   . Chicken pox   . Depression   . Diarrhea    takes Imodium daily  . GERD (gastroesophageal reflux disease)   . H/O hiatal hernia   . Headache(784.0)   . History of kidney stones    1996ish  . History of radiation therapy 11/16/16- 01/01/17   Base of Tongue/ 66 gy in 33 fractions/ Dose: 2 Gy  . Hypertension   . Iron deficiency anemia 12/11/2018  . Migraine    none for 5 years (as of 01/13/16)  . OSA (obstructive sleep apnea) 10/13/2015   unable to get cpap, plans to get one in 2018  . Pneumonia   . Restless legs   . Scoliosis   . Shingles 08/28/2013  . Shortness of breath    with exertion  . Sleep apnea   . Tongue cancer ([place])    tongue cancer    Past Surgical History:  Procedure Laterality Date  . BACK SURGERY  07/21/1978  . CHOLECYSTECTOMY N/A 08/15/2013   Procedure: LAPAROSCOPIC CHOLECYSTECTOMY WITH INTRAOPERATIVE CHOLANGIOGRAM;  Surgeon: Gwenyth Ober, MD;  Location: [facility];  Service: General;  Laterality: N/A;  . COLONOSCOPY N/A 01/09/2013   Procedure: COLONOSCOPY;  Surgeon: Beryle Beams, MD;  Location: [facility];  Service: Endoscopy;  Laterality: N/A;  . COLONOSCOPY   03/2018  . DIRECT LARYNGOSCOPY  07/2016   Dr. Nicolette Bang [facility]  . ESOPHAGOGASTRODUODENOSCOPY  03/2018  . ESOPHAGOGASTRODUODENOSCOPY (EGD) WITH PROPOFOL N/A 04/24/2019   Procedure: ESOPHAGOGASTRODUODENOSCOPY (EGD) WITH PROPOFOL;  Surgeon: Gatha Mayer, MD;  [facility];  Service: Endoscopy;  Laterality: N/A;  . GASTROSTOMY TUBE PLACEMENT  09/27/2016  . GIVENS CAPSULE STUDY N/A 04/24/2019   Procedure: GIVENS CAPSULE STUDY;  Surgeon: Gatha Mayer, MD;  [facility];  Service: Endoscopy;  Laterality: N/A;  . HERNIA REPAIR Left 1981  . IR PATIENT EVAL TECH 0-60 MINS  12/13/2016  . IR PATIENT EVAL TECH 0-60 MINS  04/11/2017  . IR PATIENT EVAL TECH 0-60 MINS  03/24/2018  . IR PATIENT EVAL TECH 0-60 MINS  11/23/2018  . IR PATIENT EVAL TECH 0-60 MINS  05/28/2019  . IR REPLACE G-TUBE SIMPLE WO FLUORO  12/12/2017  . IR REPLACE G-TUBE SIMPLE WO FLUORO  03/29/2018  . IR REPLACE G-TUBE SIMPLE WO FLUORO  06/29/2018  . IR REPLACE G-TUBE SIMPLE WO FLUORO  11/02/2018  . IR REPLACE G-TUBE SIMPLE WO FLUORO  03/08/2019  . IR REPLACE G-TUBE SIMPLE WO FLUORO  07/02/2019  . IR REPLACE G-TUBE SIMPLE WO FLUORO  11/30/2019  . IR  REPLACE G-TUBE SIMPLE WO FLUORO  12/27/2019  . IR REPLACE G-TUBE SIMPLE WO FLUORO  05/27/2020  . MODIFIED RADICAL NECK DISSECTION Left 09/27/2016   Levels 1 & 2  . PARTIAL GLOSSECTOMY Left 09/27/2016   Left hemi partial glossectomy  . SPINE SURGERY  01/20/2016   fusion  . TONSILLECTOMY    . tracheotomy  09/27/2016  . TUBAL LIGATION  06/1988    There were no vitals filed for this visit.   Subjective Assessment - 06/04/20 0936    Subjective "I've been eating more"                 ADULT SLP TREATMENT - 06/04/20 0953      General Information   Behavior/Cognition Alert;Cooperative;Pleasant mood      Treatment Provided   Treatment provided Cognitive-Linquistic;Dysphagia      Dysphagia Treatment   Temperature Spikes Noted No    Oral Cavity - Dentition  Edentulous    Treatment Methods Compensation strategy training;Patient/caregiver education    Patient observed directly with PO's Yes    Type of PO's observed Thin liquids    Feeding Able to feed self    Liquids provided via Straw    Type of cueing Verbal    Amount of cueing Minimal    Other treatment/comments Kristen [name] has kept food diary and had increased eating 3x a day instead of 1x a day, however diet remains mac and cheese and mashed potatoes. Kristen [name] reports she plans on purchasing fruit and protein powder to make smoothies when she gets her food stamps. Reveiwed results of MBSS which recommded dys 3.  She sis fearful Dys 3 will get stuck in her throat despite results of MBSS. Kristen [name] remains averse to Boost and Ensure. No overt s/s of aspiration today, she verbalized and follow swallow protocol with rare min A. Kristen [name] is open to adding sweet potato to her diet as we discussed this is the same consistency  as mashed potato      Cognitive-Linquistic Treatment   Treatment focused on Dysarthria;Patient/family/caregiver education    Skilled Treatment Introduced Kristen [name] to [facility] compensations for dysarthria. She reports difficulty with hard" c " /k/ but no difficulty with /k/ or /g/. Educated her that the vowels surrounding the "c" are affecting her ability to make a velar stop, as she produces velar stops inconsistently.  Targeted this having her make the /k/, pause, then say the rest of the word with success. Initated training in breath support, slow rate, over articulation, and pausing in structured task with usual mod A at phrase level in strcutured task      Assessment / Recommendations / [place] with current plan of care      Dysphagia Recommendations   Diet recommendations Dysphagia 3 (mechanical soft);Thin liquid    Liquids provided via Straw    Medication Administration Crushed with puree   pt uses PEG   Supervision Patient able to self feed    Compensations Slow  rate;Small sips/bites;Effortful swallow;Multiple dry swallows after each bite/sip    Postural Changes and/or Swallow Maneuvers Seated upright 90 degrees;Upright 30-60 min after meal      Progression Toward Goals   Progression toward goals Progressing toward goals            SLP Education - 06/04/20 1258    Education Details vary diet, compensations for dysarthria, HEP for dysarthira    Person(s) Educated Patient    Methods Explanation;Demonstration;Verbal cues;Handout    Comprehension Verbalized understanding;Returned demonstration;Verbal  cues required;Need further instruction            SLP Short Term Goals - 06/04/20 1303      SLP SHORT TERM GOAL #1   Title pt will complete HEP with rare min A over two sessions    Time 4    Period Weeks    Status On-going      SLP SHORT TERM GOAL #2   Title Pt will keep a food journal over 2 weeks with  rare min A to promote improved balanced diet and advanced consistency.    Time 4    Period Weeks    Status On-going      SLP SHORT TERM GOAL #3   Title Pt will add 2 new high protien/fat foods to her diet rotation over 3 weeks    Time 4    Period Weeks    Status On-going      SLP SHORT TERM GOAL #4   Title Pt will utilize 3 compensatory strategies (internal and environmental)  for dysarthira 18/20 sentence responses in structured task    Time 4    Period Weeks    Status On-going      SLP SHORT TERM GOAL #5   Title Pt will complete pt reported outcome survey for dysarthria    Time 2    Period Weeks    Status On-going            SLP Long Term Goals - 06/04/20 1306      SLP LONG TERM GOAL #1   Title pt will complete HEP with modified independence over three sessions     Time 8    Period Weeks    Status On-going      SLP LONG TERM GOAL #2   Title Pt will add 4 new foods (protien/healthy fat) to her diet rotation over 6 weeks    Time 8    Period Weeks    Status On-going      SLP LONG TERM GOAL #3   Title Pt will  maintain food journal over 5 weeks to promote improved variety of diet and advance consisteny    Time 8    Period Weeks    Status On-going      SLP LONG TERM GOAL #4   Title Pt will improve score of communciation PRO by 2 points    Time 8    Period Weeks    Status On-going      SLP LONG TERM GOAL #5   Title Pt will carryover strategies for dysarthriaand vocal efficiency to be intelligilble in 15 minute conversation in mildly noisy environment    Time 8    Period Weeks    Status On-going            Plan - 06/04/20 1259    Clinical Impression Statement Kristen [name] has initated a food diary, increasing to eating 3x a day instead of one. Diet remains mac and cheese and potatoes. Initiated ID'ing foods she can add to her diet as well as results of FEES that recommended diet up grade to Dys 3. Ongoing reports of communication breakdowns and frustration. Initiated training in compensations for dysarthria and HEP for dysarthira. Continue skilled ST to maximize safety of swallow, nutrition/hydration and communication for successful participation at home and in community. She is to call Dr. Genene Churn office to get referral to [facility].    Speech Therapy Frequency 2x / week    Duration 8 weeks  17 visits   Treatment/Interventions Aspiration precaution training;Environmental controls;Cueing hierarchy;Pharyngeal strengthening exercises;Compensatory techniques;Functional tasks;Compensatory strategies;SLP instruction and feedback;Oral motor exercises;Patient/family education;Multimodal communcation approach;Internal/external aids;Trials of upgraded texture/liquids;Diet toleration management by SLP;Other (comment)    Potential to Achieve Goals Fair    Potential Considerations Previous level of function;Co-morbidities           Patient will benefit from skilled therapeutic intervention in order to improve the following deficits and impairments:   Dysarthria and anarthria  Dysphagia,  oropharyngeal phase    Problem List Patient Active Problem List   Diagnosis Date Noted  . Alcohol use 08/05/2019  . Bilateral lower extremity edema 08/05/2019  . Peripheral edema 03/12/2019  . Iron deficiency anemia 12/11/2018  . Loosening of hardware in spine ([place])   . Other spondylosis with radiculopathy, lumbar region   . History of lumbar spinal fusion 11/06/2018  . UTI (urinary tract infection) 05/15/2017  . 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 Malignant neoplasm of base of tongue ([place]) 10/29/2016  . 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 Headache associated with sexual activity 05/14/2016  . Tongue lesion 05/14/2016  . Thrombocytopenia ([place]) 01/22/2016  . Chronic diarrhea 01/22/2016  . Chest pain   . Essential hypertension   . Spinal stenosis of lumbar region 01/20/2016    Class: Chronic  . DDD (degenerative disc disease), lumbar 01/20/2016    Class: Chronic  . Spinal stenosis of lumbar region with neurogenic claudication 01/20/2016  . Low back pain 12/22/2015  . Hypersomnolence 10/13/2015  . Muscle cramp 08/01/2015  . GERD (gastroesophageal reflux disease) 07/09/2015  . Morbid obesity with BMI of 40.0-44.9, adult ([place]) 01/08/2013    Kristen [name], Annye Rusk MS, CCC-SLP 06/04/2020, 1:08 PM  [facility] [address] [place], [place], [address] Phone: [phone]   Fax:  [phone]   Name: Kristen [name] MRN: [id number] Date of Birth: 1965-12-20

## 2020-06-06 ENCOUNTER — Other Ambulatory Visit: Payer: Self-pay | Admitting: Internal Medicine

## 2020-06-06 ENCOUNTER — Ambulatory Visit (INDEPENDENT_AMBULATORY_CARE_PROVIDER_SITE_OTHER): Payer: Medicare Other | Admitting: Specialist

## 2020-06-06 ENCOUNTER — Encounter: Payer: Self-pay | Admitting: Specialist

## 2020-06-06 ENCOUNTER — Other Ambulatory Visit: Payer: Self-pay | Admitting: Family Medicine

## 2020-06-06 ENCOUNTER — Other Ambulatory Visit: Payer: Self-pay | Admitting: Specialist

## 2020-06-06 ENCOUNTER — Other Ambulatory Visit: Payer: Self-pay

## 2020-06-06 VITALS — BP 98/63 | HR 76 | Ht 69.0 in | Wt 179.0 lb

## 2020-06-06 DIAGNOSIS — M4802 Spinal stenosis, cervical region: Secondary | ICD-10-CM

## 2020-06-06 DIAGNOSIS — M4712 Other spondylosis with myelopathy, cervical region: Secondary | ICD-10-CM

## 2020-06-06 DIAGNOSIS — R29898 Other symptoms and signs involving the musculoskeletal system: Secondary | ICD-10-CM

## 2020-06-06 DIAGNOSIS — I10 Essential (primary) hypertension: Secondary | ICD-10-CM

## 2020-06-06 MED ORDER — PREGABALIN 75 MG PO CAPS: ORAL_CAPSULE | ORAL | 0 refills | Status: DC

## 2020-06-06 NOTE — Patient Instructions (Signed)
Avoid overhead lifting and overhead use of the arms. Do not lift greater than 5-10 lbs. Adjust head rest in vehicle to prevent hyperextension if rear ended. Take extra precautions to avoid falling. Physical therapy for McKenzie exercises and saunders traction and UE strengthening.

## 2020-06-06 NOTE — Progress Notes (Signed)
Office Visit Note   Patient: Kristen [name]           Date of Birth: 23-Sep-1965           MRN: [id number] Visit Date: 06/06/2020              Requested by: [name], MD [place],  [place] [id number] PCP: [name], MD   Assessment & Plan: Visit Diagnoses:  1. Upper extremity weakness   2. Other spondylosis with myelopathy, cervical region   3. Spinal stenosis of cervical region     Plan: Avoid overhead lifting and overhead use of the arms. Do not lift greater than 5-10 lbs. Adjust head rest in vehicle to prevent hyperextension if rear ended. Take extra precautions to avoid falling. Physical therapy for McKenzie exercises and saunders traction and UE strengthening.   Follow-Up Instructions: No follow-ups on file.   Orders:  No orders of the defined types were placed in this encounter.  No orders of the defined types were placed in this encounter.     Procedures: No procedures performed   Clinical Data: No additional findings.   Subjective: Chief Complaint  Patient presents with  . Neck - Follow-up    MRI Review CSP    55 year old right handed female with right leg pain and there is history of feeling of paralysis and in ablility to move at all, from the neck down she could not move and that episode occurred about.    Review of Systems   Objective: Vital Signs: BP 98/63   Pulse 76   Ht 5' 9"  (1.753 m)   Wt 179 lb (81.2 kg)   LMP 11/15/2013   BMI 26.43 kg/m   Physical Exam  Ortho Exam  Specialty Comments:  No specialty comments available.  Imaging: No results found.   PMFS History: Patient Active Problem List   Diagnosis Date Noted  . Spinal stenosis of lumbar region 01/20/2016    Priority: High    Class: Chronic  . DDD (degenerative disc disease), lumbar 01/20/2016    Priority: High    Class: Chronic  . Alcohol use 08/05/2019  . Bilateral lower extremity edema 08/05/2019  . Peripheral edema  03/12/2019  . Iron deficiency anemia 12/11/2018  . Loosening of hardware in spine ([place])   . Other spondylosis with radiculopathy, lumbar region   . History of lumbar spinal fusion 11/06/2018  . UTI (urinary tract infection) 05/15/2017  . 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 Malignant neoplasm of base of tongue ([place]) 10/29/2016  . 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 Headache associated with sexual activity 05/14/2016  . Tongue lesion 05/14/2016  . Thrombocytopenia ([place]) 01/22/2016  . Chronic diarrhea 01/22/2016  . Chest pain   . Essential hypertension   . Spinal stenosis of lumbar region with neurogenic claudication 01/20/2016  . Low back pain 12/22/2015  . Hypersomnolence 10/13/2015  . Muscle cramp 08/01/2015  . GERD (gastroesophageal reflux disease) 07/09/2015  . Morbid obesity with BMI of 40.0-44.9, adult ([place]) 01/08/2013   Past Medical History:  Diagnosis Date  . Allergy   . Anemia    Iron deficiency  . Anxiety   . Arthritis    back, left shoulder  . Blood transfusion without reported diagnosis   . Chicken pox   . Depression   . Diarrhea    takes Imodium daily  . GERD (gastroesophageal reflux disease)   .  H/O hiatal hernia   . Headache(784.0)   . History of kidney stones    1996ish  . History of radiation therapy 11/16/16- 01/01/17   Base of Tongue/ 66 gy in 33 fractions/ Dose: 2 Gy  . Hypertension   . Iron deficiency anemia 12/11/2018  . Migraine    none for 5 years (as of 01/13/16)  . OSA (obstructive sleep apnea) 10/13/2015   unable to get cpap, plans to get one in 2018  . Pneumonia   . Restless legs   . Scoliosis   . Shingles 08/28/2013  . Shortness of breath    with exertion  . Sleep apnea   . Tongue cancer ([place])    tongue cancer    Family History  Problem Relation Age of Onset  . Heart disease Father   . Heart attack Father   . Hypertension Mother   . Arthritis Mother   . Diabetes Maternal  Grandmother   . Diabetes Paternal Grandmother   . Diabetes Maternal Uncle   . Colon polyps Neg Hx   . Colon cancer Neg Hx   . Esophageal cancer Neg Hx   . Stomach cancer Neg Hx   . Rectal cancer Neg Hx     Past Surgical History:  Procedure Laterality Date  . BACK SURGERY  07/21/1978  . CHOLECYSTECTOMY N/A 08/15/2013   Procedure: LAPAROSCOPIC CHOLECYSTECTOMY WITH INTRAOPERATIVE CHOLANGIOGRAM;  Surgeon: Gwenyth Ober, MD;  Location: [facility];  Service: General;  Laterality: N/A;  . COLONOSCOPY N/A 01/09/2013   Procedure: COLONOSCOPY;  Surgeon: Beryle Beams, MD;  Location: [place] [facility];  Service: Endoscopy;  Laterality: N/A;  . COLONOSCOPY  03/2018  . DIRECT LARYNGOSCOPY  07/2016   Dr. Nicolette Bang [facility]  . ESOPHAGOGASTRODUODENOSCOPY  03/2018  . ESOPHAGOGASTRODUODENOSCOPY (EGD) WITH PROPOFOL N/A 04/24/2019   Procedure: ESOPHAGOGASTRODUODENOSCOPY (EGD) WITH PROPOFOL;  Surgeon: Gatha Mayer, MD;  [facility];  Service: Endoscopy;  Laterality: N/A;  . GASTROSTOMY TUBE PLACEMENT  09/27/2016  . GIVENS CAPSULE STUDY N/A 04/24/2019   Procedure: GIVENS CAPSULE STUDY;  Surgeon: Gatha Mayer, MD;  [facility];  Service: Endoscopy;  Laterality: N/A;  . HERNIA REPAIR Left 1981  . IR PATIENT EVAL TECH 0-60 MINS  12/13/2016  . IR PATIENT EVAL TECH 0-60 MINS  04/11/2017  . IR PATIENT EVAL TECH 0-60 MINS  03/24/2018  . IR PATIENT EVAL TECH 0-60 MINS  11/23/2018  . IR PATIENT EVAL TECH 0-60 MINS  05/28/2019  . IR REPLACE G-TUBE SIMPLE WO FLUORO  12/12/2017  . IR REPLACE G-TUBE SIMPLE WO FLUORO  03/29/2018  . IR REPLACE G-TUBE SIMPLE WO FLUORO  06/29/2018  . IR REPLACE G-TUBE SIMPLE WO FLUORO  11/02/2018  . IR REPLACE G-TUBE SIMPLE WO FLUORO  03/08/2019  . IR REPLACE G-TUBE SIMPLE WO FLUORO  07/02/2019  . IR REPLACE G-TUBE SIMPLE WO FLUORO  11/30/2019  . IR REPLACE G-TUBE SIMPLE WO FLUORO  12/27/2019  . IR REPLACE G-TUBE SIMPLE WO FLUORO  05/27/2020  . MODIFIED RADICAL NECK DISSECTION Left  09/27/2016   Levels 1 & 2  . PARTIAL GLOSSECTOMY Left 09/27/2016   Left hemi partial glossectomy  . SPINE SURGERY  01/20/2016   fusion  . TONSILLECTOMY    . tracheotomy  09/27/2016  . TUBAL LIGATION  06/1988   Social History   Occupational History  . Occupation: Air cabin crew  Tobacco Use  . Smoking status: Never Smoker  . Smokeless tobacco: Never Used  Vaping Use  . Vaping  Use: Never used  Substance and Sexual Activity  . Alcohol use: Yes    Alcohol/week: 7.0 standard drinks    Types: 7 Cans of beer per week  . Drug use: No  . Sexual activity: Yes    Birth control/protection: Surgical

## 2020-06-07 DIAGNOSIS — Z20822 Contact with and (suspected) exposure to covid-19: Secondary | ICD-10-CM | POA: Diagnosis not present

## 2020-06-09 ENCOUNTER — Ambulatory Visit: Payer: Medicare Other | Admitting: Speech Pathology

## 2020-06-09 ENCOUNTER — Encounter: Payer: Self-pay | Admitting: Speech Pathology

## 2020-06-09 ENCOUNTER — Other Ambulatory Visit: Payer: Self-pay

## 2020-06-09 DIAGNOSIS — R471 Dysarthria and anarthria: Secondary | ICD-10-CM

## 2020-06-09 DIAGNOSIS — R1312 Dysphagia, oropharyngeal phase: Secondary | ICD-10-CM

## 2020-06-09 NOTE — Therapy (Signed)
[facility] [address] [place], [place], [address] Phone: [phone]   Fax:  [phone]  Speech Language Pathology Treatment  Patient Details  Name: Kristen [name] MRN: [id number] Date of Birth: [birth year] Referring Provider (SLP): Francina Ames MD   Encounter Date: 06/09/2020   End of Session - 06/09/20 1147    Visit Number 3    Number of Visits 17    Date for SLP Re-Evaluation 08/01/20    SLP Start Time 0933    SLP Stop Time  0093    SLP Time Calculation (min) 41 min    Activity Tolerance Patient tolerated treatment well           Past Medical History:  Diagnosis Date  . Allergy   . Anemia    Iron deficiency  . Anxiety   . Arthritis    back, left shoulder  . Blood transfusion without reported diagnosis   . Chicken pox   . Depression   . Diarrhea    takes Imodium daily  . GERD (gastroesophageal reflux disease)   . H/O hiatal hernia   . Headache(784.0)   . History of kidney stones    1996ish  . History of radiation therapy 11/16/16- 01/01/17   Base of Tongue/ 66 gy in 33 fractions/ Dose: 2 Gy  . Hypertension   . Iron deficiency anemia 12/11/2018  . Migraine    none for 5 years (as of 01/13/16)  . OSA (obstructive sleep apnea) 10/13/2015   unable to get cpap, plans to get one in 2018  . Pneumonia   . Restless legs   . Scoliosis   . Shingles 08/28/2013  . Shortness of breath    with exertion  . Sleep apnea   . Tongue cancer ([place])    tongue cancer    Past Surgical History:  Procedure Laterality Date  . BACK SURGERY  07/21/1978  . CHOLECYSTECTOMY N/A 08/15/2013   Procedure: LAPAROSCOPIC CHOLECYSTECTOMY WITH INTRAOPERATIVE CHOLANGIOGRAM;  Surgeon: Gwenyth Ober, MD;  Location: [facility];  Service: General;  Laterality: N/A;  . COLONOSCOPY N/A 01/09/2013   Procedure: COLONOSCOPY;  Surgeon: Beryle Beams, MD;  Location: [facility];  Service: Endoscopy;  Laterality: N/A;  . COLONOSCOPY   03/2018  . DIRECT LARYNGOSCOPY  07/2016   Dr. Nicolette [facility]  . ESOPHAGOGASTRODUODENOSCOPY  03/2018  . ESOPHAGOGASTRODUODENOSCOPY (EGD) WITH PROPOFOL N/A 04/24/2019   Procedure: ESOPHAGOGASTRODUODENOSCOPY (EGD) WITH PROPOFOL;  Surgeon: Gatha Mayer, MD;  Location: [facility];  Service: Endoscopy;  Laterality: N/A;  . GASTROSTOMY TUBE PLACEMENT  09/27/2016  . GIVENS CAPSULE STUDY N/A 04/24/2019   Procedure: GIVENS CAPSULE STUDY;  Surgeon: Gatha Mayer, MD;  Location: [facility];  Service: Endoscopy;  Laterality: N/A;  . HERNIA REPAIR Left 1981  . IR PATIENT EVAL TECH 0-60 MINS  12/13/2016  . IR PATIENT EVAL TECH 0-60 MINS  04/11/2017  . IR PATIENT EVAL TECH 0-60 MINS  03/24/2018  . IR PATIENT EVAL TECH 0-60 MINS  11/23/2018  . IR PATIENT EVAL TECH 0-60 MINS  05/28/2019  . IR REPLACE G-TUBE SIMPLE WO FLUORO  12/12/2017  . IR REPLACE G-TUBE SIMPLE WO FLUORO  03/29/2018  . IR REPLACE G-TUBE SIMPLE WO FLUORO  06/29/2018  . IR REPLACE G-TUBE SIMPLE WO FLUORO  11/02/2018  . IR REPLACE G-TUBE SIMPLE WO FLUORO  03/08/2019  . IR REPLACE G-TUBE SIMPLE WO FLUORO  07/02/2019  . IR REPLACE G-TUBE SIMPLE WO FLUORO  11/30/2019  . IR  REPLACE G-TUBE SIMPLE WO FLUORO  12/27/2019  . IR REPLACE G-TUBE SIMPLE WO FLUORO  05/27/2020  . MODIFIED RADICAL NECK DISSECTION Left 09/27/2016   Levels 1 & 2  . PARTIAL GLOSSECTOMY Left 09/27/2016   Left hemi partial glossectomy  . SPINE SURGERY  01/20/2016   fusion  . TONSILLECTOMY    . tracheotomy  09/27/2016  . TUBAL LIGATION  06/1988    There were no vitals filed for this visit.   Subjective Assessment - 06/09/20 0936    Subjective "I bruised by arm , I don't know how"    Currently in Pain? Yes    Pain Score 2     Pain Location Mouth    Pain Orientation Right    Pain Descriptors / Indicators Aching    Pain Type Chronic pain    Pain Onset More than a month ago    Pain Frequency Constant                 ADULT SLP TREATMENT - 06/09/20 0940       General Information   Behavior/Cognition Alert;Cooperative;Pleasant mood      Treatment Provided   Treatment provided Dysphagia;Cognitive-Linquistic      Dysphagia Treatment   Temperature Spikes Noted No    Respiratory Status Room air    Oral Cavity - Dentition Edentulous    Treatment Methods Compensation strategy training;Patient/caregiver education    Patient observed directly with PO's Yes    Type of PO's observed Thin liquids    Feeding Able to feed self    Liquids provided via Straw    Type of cueing Verbal    Amount of cueing Minimal    Other treatment/comments Food diary continues with Noemie eating between 2-3x a day. She added fish sticks, plantains, cauliflower to her diet each 1x. She has not consistently added protien to her diet daily. Instructed her to add 1 protien food to her diet of mashed potatoes and mac and cheese 3x a week. Collene followed swallow precautions with rare min A. She continues to report odynaphagia 2/10, there fore HEP deferred today. Continue to encourage her to try protien shakes.      Cognitive-Linquistic Treatment   Treatment focused on Dysarthria;Patient/family/caregiver education    Skilled Treatment Lilla completed HEP for dysarthria with occasional min A. She carried over compensations for dysarthria in oral reading task with occasional min A 14/17 sentences. In structured speech task generating 3-4 sentence description using compensations for dysarthria with usual cues for breath support, repetition was required 2/32 sentences. She carried over slow rate and breath support in simple 5 minute conversation with 1 request for repetition      Assessment / Recommendations / Plan   Plan Continue with current plan of care      Dysphagia Recommendations   Diet recommendations Dysphagia 3 (mechanical soft);Thin liquid    Liquids provided via Straw    Medication Administration Crushed with puree   pt uses PEG only   Supervision Patient able to  self feed    Compensations Slow rate;Small sips/bites;Effortful swallow;Multiple dry swallows after each bite/sip    Postural Changes and/or Swallow Maneuvers Seated upright 90 degrees;Upright 30-60 min after meal      Progression Toward Goals   Progression toward goals Progressing toward goals            SLP Education - 06/09/20 1143    Education Details keep up food diary, add protien at least 3 days a week; HEP for  dysarthathria    Person(s) Educated Patient    Methods Explanation;Demonstration;Verbal cues;Handout    Comprehension Verbalized understanding;Returned demonstration;Verbal cues required;Need further instruction            SLP Short Term Goals - 06/09/20 1146      SLP SHORT TERM GOAL #1   Title pt will complete HEP with rare min A over two sessions    Time 3    Period Weeks    Status On-going      SLP SHORT TERM GOAL #2   Title Pt will keep a food journal over 2 weeks with  rare min A to promote improved balanced diet and advanced consistency.    Time 3    Period Weeks    Status On-going      SLP SHORT TERM GOAL #3   Title Pt will add 2 new high protien/fat foods to her diet rotation over 3 weeks    Time 3    Period Weeks    Status On-going      SLP SHORT TERM GOAL #4   Title Pt will utilize 3 compensatory strategies (internal and environmental)  for dysarthira 18/20 sentence responses in structured task    Time 3    Period Weeks    Status On-going      SLP SHORT TERM GOAL #5   Title Pt will complete pt reported outcome survey for dysarthria    Time 1    Period Weeks    Status On-going            SLP Long Term Goals - 06/09/20 1147      SLP LONG TERM GOAL #1   Title pt will complete HEP with modified independence over three sessions     Time 7    Period Weeks    Status On-going      SLP LONG TERM GOAL #2   Title Pt will add 4 new foods (protien/healthy fat) to her diet rotation over 6 weeks    Time 7    Period Weeks    Status On-going       SLP LONG TERM GOAL #3   Title Pt will maintain food journal over 5 weeks to promote improved variety of diet and advance consisteny    Time 7    Period Weeks    Status On-going      SLP LONG TERM GOAL #4   Title Pt will improve score of communciation PRO by 2 points    Time 7    Period Weeks    Status On-going      SLP LONG TERM GOAL #5   Title Pt will carryover strategies for dysarthriaand vocal efficiency to be intelligilble in 15 minute conversation in mildly noisy environment    Time 7    Period Weeks    Status On-going            Plan - 06/09/20 1145    Clinical Impression Statement Tricia has initated a food diary, increasing to eating 3x a day instead of one. Diet remains mac and cheese and potatoes. Initiated ID'ing foods she can add to her diet as well as results of FEES that recommended diet up grade to Dys 3. Ongoing reports of communication breakdowns and frustration. Initiated training in compensations for dysarthria and HEP for dysarthira. Continue skilled ST to maximize safety of swallow, nutrition/hydration and communication for successful participation at home and in community. She is to call Dr. Genene Churn office to get referral  to [employer].    Speech Therapy Frequency 2x / week    Duration 8 weeks   17 visits   Treatment/Interventions Aspiration precaution training;Environmental controls;Cueing hierarchy;Pharyngeal strengthening exercises;Compensatory techniques;Functional tasks;Compensatory strategies;SLP instruction and feedback;Oral motor exercises;Patient/family education;Multimodal communcation approach;Internal/external aids;Trials of upgraded texture/liquids;Diet toleration management by SLP;Other (comment)    Potential to Achieve Goals Fair    Potential Considerations Previous level of function;Co-morbidities           Patient will benefit from skilled therapeutic intervention in order to improve the following deficits and  impairments:   Dysarthria and anarthria  Dysphagia, oropharyngeal phase    Problem List Patient Active Problem List   Diagnosis Date Noted  . Alcohol use 08/05/2019  . Bilateral lower extremity edema 08/05/2019  . Peripheral edema 03/12/2019  . Iron deficiency anemia 12/11/2018  . Loosening of hardware in spine ([place])   . Other spondylosis with radiculopathy, lumbar region   . History of lumbar spinal fusion 11/06/2018  . UTI (urinary tract infection) 05/15/2017  . 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 Malignant neoplasm of base of tongue ([place]) 10/29/2016  . 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[place]) 10/29/2016  . Headache associated with sexual activity 05/14/2016  . Tongue lesion 05/14/2016  . Thrombocytopenia ([place]) 01/22/2016  . Chronic diarrhea 01/22/2016  . Chest pain   . Essential hypertension   . Spinal stenosis of lumbar region 01/20/2016    Class: Chronic  . DDD (degenerative disc disease), lumbar 01/20/2016    Class: Chronic  . Spinal stenosis of lumbar region with neurogenic claudication 01/20/2016  . Low back pain 12/22/2015  . Hypersomnolence 10/13/2015  . Muscle cramp 08/01/2015  . GERD (gastroesophageal reflux disease) 07/09/2015  . Morbid obesity with BMI of 40.0-44.9, adult ([place]) 01/08/2013    Haidan Nhan, Annye Rusk MS, CCC-SLP 06/09/2020, 11:48 AM  [facility] [address] [place], [place], [address] Phone: [phone]   Fax:  [phone]   Name: ANGALA HILGERS MRN: [id number] Date of Birth: October 05, 1965

## 2020-06-09 NOTE — Patient Instructions (Addendum)
  Great job keeping up with [employer] job adding some additional foods and trying new things   Keep up the good work - try to add some other foods more consistently through the week, for example, add fish sticks 3x a week (or whatever you like, just try to add it in more consistently)  Keep up the slow reading and practice talking slowly with a person who is patient and will let you practice talking  Remember a good breath with get you good volume and help you to speak clearer  Bring in a snack that we can eat in therapy

## 2020-06-10 ENCOUNTER — Other Ambulatory Visit: Payer: Self-pay

## 2020-06-10 ENCOUNTER — Telehealth: Payer: Self-pay

## 2020-06-10 DIAGNOSIS — F3289 Other specified depressive episodes: Secondary | ICD-10-CM

## 2020-06-10 NOTE — Telephone Encounter (Signed)
Received VM from patient that she wanted to make an appointment for counseling with [facility]. Called facility and left a VM asking if previous referral was still valid, or if a new one was needed in order to get patient scheduled.   Received return call from staff member who stated that new referral would be needed because previous one had been closed out. New referral placed and order also faxed to [phone]; received confirmation that fax went through successfully.   Returned patient's call, and left a VM with above information. Provided [facility]'s phone number for patient to call directly if she hasn't heard from scheduling team by Thursday. Provided my direct call back number as well should patient have any other questions/concerns.

## 2020-06-11 ENCOUNTER — Other Ambulatory Visit: Payer: Self-pay

## 2020-06-11 ENCOUNTER — Encounter: Payer: Self-pay | Admitting: Speech Pathology

## 2020-06-11 ENCOUNTER — Ambulatory Visit: Payer: Medicare Other | Admitting: Speech Pathology

## 2020-06-11 DIAGNOSIS — R471 Dysarthria and anarthria: Secondary | ICD-10-CM

## 2020-06-11 DIAGNOSIS — R1312 Dysphagia, oropharyngeal phase: Secondary | ICD-10-CM | POA: Diagnosis not present

## 2020-06-11 NOTE — Patient Instructions (Addendum)
   Turn down any background noise when you are talking to help you be understood  [place] job practicing slower and louder speech to help you be understood  Keep up practicing slower speech exercises and reading aloud very slowly 2x a day  SWALLOWING EXERCISES 1. Effortful Swallows - Squeeze hard with the muscles in your neck while you swallow your  saliva or a sip of water - Repeat 20 times, 2-3 times a day, and use whenever you eat or drink  2. [place]ako Swallow - swallow with your tongue sticking out - Stick tongue out and gently bite tongue with your teeth - Swallow, while holding your tongue with your teeth - Repeat 20 times, 2-3 times a day    3. Mendelsohn Maneuver - "half swallow" exercise - Start to swallow, and keep your Adam's apple up by squeezing hard with the muscles of the throat - Hold the squeeze for 5-7 seconds and then relax - Repeat 20 times, 2-3 times a day  4. Tongue Press - Press your entire tongue as hard as you can against the roof of your mouth for 3-5 seconds - Repeat 20 times, 2-3 times a day  5. Tongue Stretch/Teeth Clean - Move your tongue around the pocket between your gums and teeth, clockwise and then counter-clockwise - Repeat on the back side, clockwise and then counter-clockwise - Repeat 15-20 times, 2-3 times a day   6. Chin tuck against resistance  - Place towel or soft ball  UNDER your chin near your neck, tuck your chin and hold towel or ball and hold for 4 5 seconds  - Repeat 5 times, 2-3 times a day     10. Open mouth swallow          -Swallow with your mouth open wide          -Repeat 15 times, 2 times a day  11. Stick out your tongue and say "GA-GA-GA" loud and shart           -Repeat 25 sets of 3, 2-3 times a day

## 2020-06-11 NOTE — Therapy (Signed)
[facility] [address] [place], [place], [address] Phone: [phone]   Fax:  [phone]  Speech Language Pathology Treatment  Patient Details  Name: Kristen [name] MRN: [id number] Date of Birth: 1965-10-25 Referring Provider (SLP): Francina Ames MD   Encounter Date: 06/11/2020   End of Session - 06/11/20 1330    Visit Number 4    Number of Visits 17    Date for SLP Re-Evaluation 08/01/20    SLP Start Time 1017    SLP Stop Time  1100    SLP Time Calculation (min) 43 min    Activity Tolerance Patient tolerated treatment well           Past Medical History:  Diagnosis Date  . Allergy   . Anemia    Iron deficiency  . Anxiety   . Arthritis    back, left shoulder  . Blood transfusion without reported diagnosis   . Chicken pox   . Depression   . Diarrhea    takes Imodium daily  . GERD (gastroesophageal reflux disease)   . H/O hiatal hernia   . Headache(784.0)   . History of kidney stones    1996ish  . History of radiation therapy 11/16/16- 01/01/17   Base of Tongue/ 66 gy in 33 fractions/ Dose: 2 Gy  . Hypertension   . Iron deficiency anemia 12/11/2018  . Migraine    none for 5 years (as of 01/13/16)  . OSA (obstructive sleep apnea) 10/13/2015   unable to get cpap, plans to get one in 2018  . Pneumonia   . Restless legs   . Scoliosis   . Shingles 08/28/2013  . Shortness of breath    with exertion  . Sleep apnea   . Tongue cancer ([place])    tongue cancer    Past Surgical History:  Procedure Laterality Date  . BACK SURGERY  07/21/1978  . CHOLECYSTECTOMY N/A 08/15/2013   Procedure: LAPAROSCOPIC CHOLECYSTECTOMY WITH INTRAOPERATIVE CHOLANGIOGRAM;  Surgeon: Gwenyth Ober, MD;  Location: [facility];  Service: General;  Laterality: N/A;  . COLONOSCOPY N/A 01/09/2013   Procedure: COLONOSCOPY;  Surgeon: Beryle Beams, MD;  Location: [facility];  Service: Endoscopy;  Laterality: N/A;  . COLONOSCOPY   03/2018  . DIRECT LARYNGOSCOPY  07/2016   Dr. Nicolette Bang [facility]  . ESOPHAGOGASTRODUODENOSCOPY  03/2018  . ESOPHAGOGASTRODUODENOSCOPY (EGD) WITH PROPOFOL N/A 04/24/2019   Procedure: ESOPHAGOGASTRODUODENOSCOPY (EGD) WITH PROPOFOL;  Surgeon: Gatha Mayer, MD;  [facility];  Service: Endoscopy;  Laterality: N/A;  . GASTROSTOMY TUBE PLACEMENT  09/27/2016  . GIVENS CAPSULE STUDY N/A 04/24/2019   Procedure: GIVENS CAPSULE STUDY;  Surgeon: Gatha Mayer, MD;  [facility];  Service: Endoscopy;  Laterality: N/A;  . HERNIA REPAIR Left 1981  . IR PATIENT EVAL TECH 0-60 MINS  12/13/2016  . IR PATIENT EVAL TECH 0-60 MINS  04/11/2017  . IR PATIENT EVAL TECH 0-60 MINS  03/24/2018  . IR PATIENT EVAL TECH 0-60 MINS  11/23/2018  . IR PATIENT EVAL TECH 0-60 MINS  05/28/2019  . IR REPLACE G-TUBE SIMPLE WO FLUORO  12/12/2017  . IR REPLACE G-TUBE SIMPLE WO FLUORO  03/29/2018  . IR REPLACE G-TUBE SIMPLE WO FLUORO  06/29/2018  . IR REPLACE G-TUBE SIMPLE WO FLUORO  11/02/2018  . IR REPLACE G-TUBE SIMPLE WO FLUORO  03/08/2019  . IR REPLACE G-TUBE SIMPLE WO FLUORO  07/02/2019  . IR REPLACE G-TUBE SIMPLE WO FLUORO  11/30/2019  . IR  REPLACE G-TUBE SIMPLE WO FLUORO  12/27/2019  . IR REPLACE G-TUBE SIMPLE WO FLUORO  05/27/2020  . MODIFIED RADICAL NECK DISSECTION Left 09/27/2016   Levels 1 & 2  . PARTIAL GLOSSECTOMY Left 09/27/2016   Left hemi partial glossectomy  . SPINE SURGERY  01/20/2016   fusion  . TONSILLECTOMY    . tracheotomy  09/27/2016  . TUBAL LIGATION  06/1988    There were no vitals filed for this visit.   Subjective Assessment - 06/11/20 1022    Subjective "I forgot my folder at home"    Currently in Pain? Yes    Pain Score 2     Pain Location Mouth    Pain Orientation Right    Pain Descriptors / Indicators Aching    Pain Type Chronic pain    Pain Onset More than a month ago    Pain Frequency Constant                 ADULT SLP TREATMENT - 06/11/20 1023      General  Information   Behavior/Cognition Alert;Cooperative;Pleasant mood      Treatment Provided   Treatment provided Cognitive-Linquistic      Dysphagia Treatment   Temperature Spikes Noted No    Respiratory Status Room air    Oral Cavity - Dentition Edentulous    Treatment Methods Compensation strategy training;Patient/caregiver education    Patient observed directly with PO's Yes    Type of PO's observed Dysphagia 3 (soft);Thin liquids    Feeding Able to feed self    Liquids provided via Straw    Oral Phase Signs & Symptoms Prolonged mastication    Type of cueing Verbal    Amount of cueing Minimal    Other treatment/comments PO trails of thin liquid and cheese puffs [name] brought in. [name] follows swallow precautions with mod I, taking small bites and alternating solid with liquid. She ate 3 chicken drumsticks yesterday, taking small pieces of chicken and eating this in the same bolus as mashed potatoes to aid in bolus cohesion. Initiated HEP for swallowing, wtih occasion min A, except Mendelson with frequent mod A. [name] tearful again today. She did contact her MD and has referral now to [employer].      Cognitive-Linquistic Treatment   Treatment focused on Dysarthria;Patient/family/caregiver education    Skilled Treatment Not addressed today as focus was on swallowing. [name] reports she has practriced slow, loud. over articulation with family and friends and has gotten poitive feedback from them re: being more intelligible.      Assessment / Recommendations / Plan   Plan Continue with current plan of care      Dysphagia Recommendations   Diet recommendations Dysphagia 3 (mechanical soft);Thin liquid    Liquids provided via Straw    Medication Administration Crushed with puree    Supervision Patient able to self feed    Compensations Slow rate;Small sips/bites;Effortful swallow;Multiple dry swallows after each bite/sip;Follow solids with liquid    Postural  Changes and/or Swallow Maneuvers Seated upright 90 degrees;Upright 30-60 min after meal      Progression Toward Goals   Progression toward goals Progressing toward goals            SLP Education - 06/11/20 1327    Education Details HEP for dysphagia    Person(s) Educated Patient    Methods Explanation;Demonstration;Verbal cues;Handout    Comprehension Verbalized understanding;Returned demonstration;Verbal cues required;Need further instruction  SLP Short Term Goals - 06/11/20 1330      SLP SHORT TERM GOAL #1   Title pt will complete HEP with rare min A over two sessions    Time 3    Period Weeks    Status On-going      SLP SHORT TERM GOAL #2   Title Pt will keep a food journal over 2 weeks with  rare min A to promote improved balanced diet and advanced consistency.    Time 3    Period Weeks    Status On-going      SLP SHORT TERM GOAL #3   Title Pt will add 2 new high protien/fat foods to her diet rotation over 3 weeks    Time 3    Period Weeks    Status On-going      SLP SHORT TERM GOAL #4   Title Pt will utilize 3 compensatory strategies (internal and environmental)  for dysarthira 18/20 sentence responses in structured task    Time 3    Period Weeks    Status On-going      SLP SHORT TERM GOAL #5   Title Pt will complete pt reported outcome survey for dysarthria    Time 1    Period Weeks    Status On-going            SLP Long Term Goals - 06/11/20 1330      SLP LONG TERM GOAL #1   Title pt will complete HEP with modified independence over three sessions     Time 7    Period Weeks    Status On-going      SLP LONG TERM GOAL #2   Title Pt will add 4 new foods (protien/healthy fat) to her diet rotation over 6 weeks    Time 7    Period Weeks    Status On-going      SLP LONG TERM GOAL #3   Title Pt will maintain food journal over 5 weeks to promote improved variety of diet and advance consisteny    Time 7    Period Weeks    Status On-going       SLP LONG TERM GOAL #4   Title Pt will improve score of communciation PRO by 2 points    Time 7    Period Weeks    Status On-going      SLP LONG TERM GOAL #5   Title Pt will carryover strategies for dysarthriaand vocal efficiency to be intelligilble in 15 minute conversation in mildly noisy environment    Time 7    Period Weeks    Status On-going            Plan - 06/11/20 1328    Clinical Impression Statement Keshanna has initated a food diary, increasing to eating 3x a day instead of one. Diet remains mac and cheese and potatoes. Initiated ID'ing foods she can add to her diet as well as results of FEES.  Ongoing reports of communication breakdowns and frustration. Initiated training in compensations for dysarthria and HEP for dysarthira. HEP for dysphagia initiated. Continue skilled ST to maximize safety of swallow, nutrition/hydration and communication for successful participation at home and in community. Sri has increased protien (fish, chicken, pork) to 3x this week. She has referral to [employer].    Speech Therapy Frequency 2x / week    Duration 8 weeks   17 visits   Treatment/Interventions Aspiration precaution training;Environmental controls;Cueing hierarchy;Pharyngeal strengthening exercises;Compensatory techniques;Functional  tasks;Compensatory strategies;SLP instruction and feedback;Oral motor exercises;Patient/family education;Multimodal communcation approach;Internal/external aids;Trials of upgraded texture/liquids;Diet toleration management by SLP;Other (comment)    Potential to Achieve Goals Fair    Potential Considerations Previous level of function;Co-morbidities           Patient will benefit from skilled therapeutic intervention in order to improve the following deficits and impairments:   Dysphagia, oropharyngeal phase  Dysarthria and anarthria    Problem List Patient Active Problem List   Diagnosis Date Noted  . Alcohol use  08/05/2019  . Bilateral lower extremity edema 08/05/2019  . Peripheral edema 03/12/2019  . Iron deficiency anemia 12/11/2018  . Loosening of hardware in spine ([place])   . Other spondylosis with radiculopathy, lumbar region   . History of lumbar spinal fusion 11/06/2018  . UTI (urinary tract infection) 05/15/2017  . 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 Malignant neoplasm of base of tongue ([place]) 10/29/2016  . 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[place]) 10/29/2016  . Headache associated with sexual activity 05/14/2016  . Tongue lesion 05/14/2016  . Thrombocytopenia ([place]) 01/22/2016  . Chronic diarrhea 01/22/2016  . Chest pain   . Essential hypertension   . Spinal stenosis of lumbar region 01/20/2016    Class: Chronic  . DDD (degenerative disc disease), lumbar 01/20/2016    Class: Chronic  . Spinal stenosis of lumbar region with neurogenic claudication 01/20/2016  . Low back pain 12/22/2015  . Hypersomnolence 10/13/2015  . Muscle cramp 08/01/2015  . GERD (gastroesophageal reflux disease) 07/09/2015  . Morbid obesity with BMI of 40.0-44.9, adult ([place]) 01/08/2013    Azharia Surratt, Annye Rusk MS, CCC-SLP 06/11/2020, 1:31 PM  [facility] [address] [place], [place], [address] Phone: [phone]   Fax:  [phone]   Name: Kristen [name] MRN: [id number] Date of Birth: August 25, 1965

## 2020-06-12 DIAGNOSIS — Z20822 Contact with and (suspected) exposure to covid-19: Secondary | ICD-10-CM | POA: Diagnosis not present

## 2020-06-18 ENCOUNTER — Ambulatory Visit: Payer: Medicare Other | Admitting: Speech Pathology

## 2020-06-20 ENCOUNTER — Ambulatory Visit: Payer: Medicare Other | Attending: Otolaryngology

## 2020-06-20 DIAGNOSIS — R1312 Dysphagia, oropharyngeal phase: Secondary | ICD-10-CM | POA: Insufficient documentation

## 2020-06-20 DIAGNOSIS — R471 Dysarthria and anarthria: Secondary | ICD-10-CM | POA: Insufficient documentation

## 2020-06-21 DIAGNOSIS — Z20822 Contact with and (suspected) exposure to covid-19: Secondary | ICD-10-CM | POA: Diagnosis not present

## 2020-06-23 ENCOUNTER — Other Ambulatory Visit: Payer: Self-pay

## 2020-06-23 ENCOUNTER — Ambulatory Visit: Payer: Medicare Other | Admitting: Speech Pathology

## 2020-06-23 ENCOUNTER — Encounter: Payer: Self-pay | Admitting: Speech Pathology

## 2020-06-23 DIAGNOSIS — R471 Dysarthria and anarthria: Secondary | ICD-10-CM

## 2020-06-23 DIAGNOSIS — R1312 Dysphagia, oropharyngeal phase: Secondary | ICD-10-CM | POA: Diagnosis not present

## 2020-06-23 NOTE — Patient Instructions (Signed)
   Keep up your speech and swallowing exercises twice a day  [place] job keeping up with your food diary and adding more foods - focus on eating 3x a day -   Include snacks on your diary  Great job slowing down your speech, making each sound distinct and using syllables  Remember, you can do many of the exercises during the commercials, in the car, in the waiting room  A good breath will power your speech and volume and help you be understood

## 2020-06-23 NOTE — Therapy (Signed)
[facility] [address] [place], [place], [address] Phone: [phone]   Fax:  [phone]  Speech Language Pathology Treatment  Patient Details  Name: MODESTY RUDY MRN: [id number] Date of Birth: 1965/08/25 Referring Provider (SLP): Francina Ames MD   Encounter Date: 06/23/2020   End of Session - 06/23/20 1103    Visit Number 5    Number of Visits 17    Date for SLP Re-Evaluation 08/01/20    SLP Start Time 0932    SLP Stop Time  4287    SLP Time Calculation (min) 42 min    Activity Tolerance Patient tolerated treatment well           Past Medical History:  Diagnosis Date  . Allergy   . Anemia    Iron deficiency  . Anxiety   . Arthritis    back, left shoulder  . Blood transfusion without reported diagnosis   . Chicken pox   . Depression   . Diarrhea    takes Imodium daily  . GERD (gastroesophageal reflux disease)   . H/O hiatal hernia   . Headache(784.0)   . History of kidney stones    1996ish  . History of radiation therapy 11/16/16- 01/01/17   Base of Tongue/ 66 gy in 33 fractions/ Dose: 2 Gy  . Hypertension   . Iron deficiency anemia 12/11/2018  . Migraine    none for 5 years (as of 01/13/16)  . OSA (obstructive sleep apnea) 10/13/2015   unable to get cpap, plans to get one in 2018  . Pneumonia   . Restless legs   . Scoliosis   . Shingles 08/28/2013  . Shortness of breath    with exertion  . Sleep apnea   . Tongue cancer ([place])    tongue cancer    Past Surgical History:  Procedure Laterality Date  . BACK SURGERY  07/21/1978  . CHOLECYSTECTOMY N/A 08/15/2013   Procedure: LAPAROSCOPIC CHOLECYSTECTOMY WITH INTRAOPERATIVE CHOLANGIOGRAM;  Surgeon: Gwenyth Ober, MD;  Location: [facility];  Service: General;  Laterality: N/A;  . COLONOSCOPY N/A 01/09/2013   Procedure: COLONOSCOPY;  Surgeon: Beryle Beams, MD;  Location: [facility];  Service: Endoscopy;  Laterality: N/A;  . COLONOSCOPY   03/2018  . DIRECT LARYNGOSCOPY  07/2016   Dr. Nicolette Bang [facility]  . ESOPHAGOGASTRODUODENOSCOPY  03/2018  . ESOPHAGOGASTRODUODENOSCOPY (EGD) WITH PROPOFOL N/A 04/24/2019   Procedure: ESOPHAGOGASTRODUODENOSCOPY (EGD) WITH PROPOFOL;  Surgeon: Gatha Mayer, MD;  [facility];  Service: Endoscopy;  Laterality: N/A;  . GASTROSTOMY TUBE PLACEMENT  09/27/2016  . GIVENS CAPSULE STUDY N/A 04/24/2019   Procedure: GIVENS CAPSULE STUDY;  Surgeon: Gatha Mayer, MD;  [facility];  Service: Endoscopy;  Laterality: N/A;  . HERNIA REPAIR Left 1981  . IR PATIENT EVAL TECH 0-60 MINS  12/13/2016  . IR PATIENT EVAL TECH 0-60 MINS  04/11/2017  . IR PATIENT EVAL TECH 0-60 MINS  03/24/2018  . IR PATIENT EVAL TECH 0-60 MINS  11/23/2018  . IR PATIENT EVAL TECH 0-60 MINS  05/28/2019  . IR REPLACE G-TUBE SIMPLE WO FLUORO  12/12/2017  . IR REPLACE G-TUBE SIMPLE WO FLUORO  03/29/2018  . IR REPLACE G-TUBE SIMPLE WO FLUORO  06/29/2018  . IR REPLACE G-TUBE SIMPLE WO FLUORO  11/02/2018  . IR REPLACE G-TUBE SIMPLE WO FLUORO  03/08/2019  . IR REPLACE G-TUBE SIMPLE WO FLUORO  07/02/2019  . IR REPLACE G-TUBE SIMPLE WO FLUORO  11/30/2019  . IR  REPLACE G-TUBE SIMPLE WO FLUORO  12/27/2019  . IR REPLACE G-TUBE SIMPLE WO FLUORO  05/27/2020  . MODIFIED RADICAL NECK DISSECTION Left 09/27/2016   Levels 1 & 2  . PARTIAL GLOSSECTOMY Left 09/27/2016   Left hemi partial glossectomy  . SPINE SURGERY  01/20/2016   fusion  . TONSILLECTOMY    . tracheotomy  09/27/2016  . TUBAL LIGATION  06/1988    There were no vitals filed for this visit.   Subjective Assessment - 06/23/20 0940    Subjective "I have been adding things"    Currently in Pain? Yes    Pain Score 7     Pain Location Abdomen    Pain Descriptors / Indicators Aching   stomach ache   Pain Type Chronic pain    Pain Onset More than a month ago    Aggravating Factors  She took meds via PEG and did not eat resultingn in stomach ache - I provided applesauce for her  to eat    Pain Relieving Factors food                 ADULT SLP TREATMENT - 06/23/20 0945      General Information   Behavior/Cognition Alert;Cooperative;Pleasant mood      Treatment Provided   Treatment provided Dysphagia;Cognitive-Linquistic      Dysphagia Treatment   Temperature Spikes Noted No    Respiratory Status Room air    Oral Cavity - Dentition Edentulous    Treatment Methods Compensation strategy training;Patient/caregiver education    Patient observed directly with PO's Yes    Type of PO's observed Nectar-thick liquids;Dysphagia 1 (puree);Dysphagia 3 (soft)   kefir   Feeding Able to feed self    Liquids provided via Straw    Oral Phase Signs & Symptoms Prolonged mastication   difficulty sucking kefir with straw   Type of cueing Verbal    Amount of cueing Minimal    Other treatment/comments PO trials of kefir to add probiotics and protien. She reported diffculty sucking this consistency, Cues to place straw in posterior mouth did not help. Skyelyn added more chicken to her diet and reports eating 3x a day farily regularly, however she forgot her food diary, but says she is keeping up with it. She has not added sweet potatoes, but will try to add these this week. No overt s/s of aspiration. HEP for dysphagia completed with rare min A, including Mendelson. CTAR not addressed to day due to time      Cognitive-Linquistic Treatment   Treatment focused on Dysarthria;Patient/family/caregiver education    Skilled Treatment Ashara reports completing HEP for dysarthria with rare min A. In structured task repeating and generating sentences with multisyllabic words. using over articulation, and syllables with occasional min A. In simple 5 minute converation, [place] carried over compensations with occasional min A and modeling. She reports her family and friends have given her good feedback that her speech is more intelligible when she uses slow rate and over articulation.       Assessment / Recommendations / Plan   Plan Continue with current plan of care      Dysphagia Recommendations   Diet recommendations Dysphagia 3 (mechanical soft);Dysphagia 2 (fine chop);Thin liquid    Liquids provided via Straw    Medication Administration Crushed with puree    Supervision Patient able to self feed    Compensations Slow rate;Small sips/bites;Effortful swallow;Multiple dry swallows after each bite/sip;Follow solids with liquid    Postural Changes and/or Swallow  Maneuvers Seated upright 90 degrees;Upright 30-60 min after meal      Progression Toward Goals   Progression toward goals Progressing toward goals            SLP Education - 06/23/20 1011    Education Details compensations for dysarthria, HEP for dysphagia and dysarthria, keep up food diary    Person(s) Educated Patient    Methods Explanation;Demonstration;Verbal cues;Handout    Comprehension Verbalized understanding;Returned demonstration;Verbal cues required;Need further instruction            SLP Short Term Goals - 06/23/20 1101      SLP [place] #1   Title pt will complete HEP with rare min A over two sessions    Baseline 06/23/20;    Time 2    Period Weeks    Status On-going      SLP SHORT TERM GOAL #2   Title Pt will keep a food journal over 2 weeks with  rare min A to promote improved balanced diet and advanced consistency.    Time 2    Period Weeks    Status On-going      SLP SHORT TERM GOAL #3   Title Pt will add 2 new high protien/fat foods to her diet rotation over 3 weeks    Time 2    Period Weeks    Status On-going      SLP SHORT TERM GOAL #4   Title Pt will utilize 3 compensatory strategies (internal and environmental)  for dysarthira 18/20 sentence responses in structured task    Time 3    Period Weeks    Status Achieved      SLP SHORT TERM GOAL #5   Title Pt will complete pt reported outcome survey for dysarthria    Time 1    Period Weeks    Status On-going             SLP Long Term Goals - 06/23/20 1102      SLP LONG TERM GOAL #1   Title pt will complete HEP with modified independence over three sessions     Time 6    Period Weeks    Status On-going      SLP LONG TERM GOAL #2   Title Pt will add 4 new foods (protien/healthy fat) to her diet rotation over 6 weeks    Time 6    Period Weeks    Status On-going      SLP LONG TERM GOAL #3   Title Pt will maintain food journal over 5 weeks to promote improved variety of diet and advance consisteny    Time 6    Period Weeks    Status On-going      SLP LONG TERM GOAL #4   Title Pt will improve score of communciation PRO by 2 points    Time 6    Period Weeks    Status On-going      SLP LONG TERM GOAL #5   Title Pt will carryover strategies for dysarthriaand vocal efficiency to be intelligilble in 15 minute conversation in mildly noisy environment    Time 6    Period Weeks    Status On-going            Plan - 06/23/20 1012    Clinical Impression Statement Alynn continues to use food diary to eat 3x a day and adding protien more regularly. She is carring over compensations for dysarthtria with family and reports improve intellgibility  and reduced frustration. Ongoing training for HEP for dysarthria and dysphagia. Attendance has been inconsistent as she has no showed and cancelled 4 sessions.  Continue skilled ST to maximize safety of swallow, nutrtion/hydration and intelligiblity for successful particiation at home and in community.    Speech Therapy Frequency 2x / week    Duration 8 weeks   17 visits   Treatment/Interventions Aspiration precaution training;Environmental controls;Cueing hierarchy;Pharyngeal strengthening exercises;Compensatory techniques;Functional tasks;Compensatory strategies;SLP instruction and feedback;Oral motor exercises;Patient/family education;Multimodal communcation approach;Internal/external aids;Trials of upgraded texture/liquids;Diet toleration management by  SLP;Other (comment)    Potential to Achieve Goals Fair    Potential Considerations Previous level of function;Co-morbidities           Patient will benefit from skilled therapeutic intervention in order to improve the following deficits and impairments:   Dysphagia, oropharyngeal phase  Dysarthria and anarthria    Problem List Patient Active Problem List   Diagnosis Date Noted  . Alcohol use 08/05/2019  . Bilateral lower extremity edema 08/05/2019  . Peripheral edema 03/12/2019  . Iron deficiency anemia 12/11/2018  . Loosening of hardware in spine ([place])   . Other spondylosis with radiculopathy, lumbar region   . History of lumbar spinal fusion 11/06/2018  . UTI (urinary tract infection) 05/15/2017  . 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 Malignant neoplasm of base of tongue ([place]) 10/29/2016  . 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 Headache associated with sexual activity 05/14/2016  . Tongue lesion 05/14/2016  . Thrombocytopenia ([place]) 01/22/2016  . Chronic diarrhea 01/22/2016  . Chest pain   . Essential hypertension   . Spinal stenosis of lumbar region 01/20/2016    Class: Chronic  . DDD (degenerative disc disease), lumbar 01/20/2016    Class: Chronic  . Spinal stenosis of lumbar region with neurogenic claudication 01/20/2016  . Low back pain 12/22/2015  . Hypersomnolence 10/13/2015  . Muscle cramp 08/01/2015  . GERD (gastroesophageal reflux disease) 07/09/2015  . Morbid obesity with BMI of 40.0-44.9, adult ([place]) 01/08/2013    Gardiner Espana, Annye Rusk MS, CCC-SLP 06/23/2020, 11:03 AM  [facility] [address] [place], [place], [address] Phone: [phone]   Fax:  [phone]   Name: SAIDA LONON MRN: [id number] Date of Birth: 10/29/1965

## 2020-06-24 ENCOUNTER — Ambulatory Visit: Payer: Medicare Other | Admitting: Physical Therapy

## 2020-06-25 ENCOUNTER — Ambulatory Visit: Payer: Medicare Other | Admitting: Speech Pathology

## 2020-06-26 DIAGNOSIS — Z20822 Contact with and (suspected) exposure to covid-19: Secondary | ICD-10-CM | POA: Diagnosis not present

## 2020-06-30 ENCOUNTER — Ambulatory Visit: Payer: Medicare Other | Admitting: Speech Pathology

## 2020-07-01 ENCOUNTER — Ambulatory Visit: Payer: Medicare Other | Admitting: Rehabilitative and Restorative Service Providers"

## 2020-07-01 DIAGNOSIS — Z20822 Contact with and (suspected) exposure to covid-19: Secondary | ICD-10-CM | POA: Diagnosis not present

## 2020-07-02 ENCOUNTER — Telehealth: Payer: Self-pay | Admitting: Speech Pathology

## 2020-07-02 ENCOUNTER — Ambulatory Visit: Payer: Medicare Other | Admitting: Speech Pathology

## 2020-07-02 NOTE — Telephone Encounter (Signed)
  Left VM re: missed appointment today. Pt has No Showed for 4 ST appointments. Reviewed No Show policy and informed her of next ST appointment 07/09/20 at 9:30.

## 2020-07-07 ENCOUNTER — Encounter: Payer: BC Managed Care – PPO | Admitting: Speech Pathology

## 2020-07-08 DIAGNOSIS — Z20822 Contact with and (suspected) exposure to covid-19: Secondary | ICD-10-CM | POA: Diagnosis not present

## 2020-07-09 ENCOUNTER — Other Ambulatory Visit: Payer: Self-pay

## 2020-07-09 ENCOUNTER — Ambulatory Visit (HOSPITAL_COMMUNITY)
Admission: RE | Admit: 2020-07-09 | Discharge: 2020-07-09 | Disposition: A | Discharge status: Home / Self Care | Payer: Medicare Other | Source: Ambulatory Visit | Attending: Student | Admitting: Student

## 2020-07-09 ENCOUNTER — Ambulatory Visit: Payer: Medicare Other | Admitting: Speech Pathology

## 2020-07-09 ENCOUNTER — Other Ambulatory Visit (HOSPITAL_COMMUNITY): Payer: Self-pay | Admitting: Student

## 2020-07-09 DIAGNOSIS — Z431 Encounter for attention to gastrostomy: Secondary | ICD-10-CM | POA: Diagnosis not present

## 2020-07-09 DIAGNOSIS — R633 Feeding difficulties, unspecified: Secondary | ICD-10-CM

## 2020-07-09 HISTORY — PX: IR REPLACE G-TUBE SIMPLE WO FLUORO: IMG2323

## 2020-07-09 MED ORDER — LIDOCAINE VISCOUS HCL 2 % MT SOLN
OROMUCOSAL | Status: AC
  Administered 2020-07-09: 10 mL
  Filled 2020-07-09: qty 15

## 2020-07-09 MED ORDER — SILVER NITRATE-POT NITRATE 75-25 % EX MISC
CUTANEOUS | Status: AC
  Filled 2020-07-09: qty 10

## 2020-07-09 NOTE — Procedures (Signed)
Pre procedural Dx: Inadvertent removal of chronic G tube.Marland Kitchen Post procedural Dx: Same  Successful bedside replacement of existing 18 Fr gastrostomy tube.   The feeding tube is ready for immediate use.  EBL: None  Complications: None immediate.  Ronny Bacon, MD Pager #: [phone]

## 2020-07-10 ENCOUNTER — Ambulatory Visit: Payer: Medicare Other | Admitting: Specialist

## 2020-07-11 ENCOUNTER — Other Ambulatory Visit: Payer: Self-pay

## 2020-07-11 ENCOUNTER — Ambulatory Visit: Payer: Medicare Other

## 2020-07-11 DIAGNOSIS — R471 Dysarthria and anarthria: Secondary | ICD-10-CM

## 2020-07-11 DIAGNOSIS — R1312 Dysphagia, oropharyngeal phase: Secondary | ICD-10-CM | POA: Diagnosis not present

## 2020-07-11 NOTE — Therapy (Signed)
[facility] [address] [place], [place], [address] Phone: [phone]   Fax:  [phone]  Speech Language Pathology Treatment  Patient Details  Name: Kristen [name] MRN: [id number] Date of Birth: Aug 08, 1965 Referring Provider (SLP): Francina Ames MD   Encounter Date: 07/11/2020   End of Session - 07/11/20 1155     Visit Number 6    Number of Visits 17    Date for SLP Re-Evaluation 08/01/20    SLP Start Time 0950    SLP Stop Time  1025    SLP Time Calculation (min) 25 min    Activity Tolerance Patient tolerated treatment well             Past Medical History:  Diagnosis Date   Allergy    Anemia    Iron deficiency   Anxiety    Arthritis    back, left shoulder   Blood transfusion without reported diagnosis    Chicken pox    Depression    Diarrhea    takes Imodium daily   GERD (gastroesophageal reflux disease)    H/O hiatal hernia    Headache(784.0)    History of kidney stones    1996ish   History of radiation therapy 11/16/16- 01/01/17   Base of Tongue/ 66 gy in 33 fractions/ Dose: 2 Gy   Hypertension    Iron deficiency anemia 12/11/2018   Migraine    none for 5 years (as of 01/13/16)   OSA (obstructive sleep apnea) 10/13/2015   unable to get cpap, plans to get one in 2018   Pneumonia    Restless legs    Scoliosis    Shingles 08/28/2013   Shortness of breath    with exertion   Sleep apnea    Tongue cancer ([place])    tongue cancer    Past Surgical History:  Procedure Laterality Date   BACK SURGERY  07/21/1978   CHOLECYSTECTOMY N/A 08/15/2013   Procedure: LAPAROSCOPIC CHOLECYSTECTOMY WITH INTRAOPERATIVE CHOLANGIOGRAM;  Surgeon: Gwenyth Ober, MD;  Location: [facility];  Service: General;  Laterality: N/A;   COLONOSCOPY N/A 01/09/2013   Procedure: COLONOSCOPY;  Surgeon: Beryle Beams, MD;  Location: [facility];  Service: Endoscopy;  Laterality: N/A;   COLONOSCOPY  03/2018   DIRECT  LARYNGOSCOPY  07/2016   Dr. Nicolette [facility]   ESOPHAGOGASTRODUODENOSCOPY  03/2018   ESOPHAGOGASTRODUODENOSCOPY (EGD) WITH PROPOFOL N/A 04/24/2019   Procedure: ESOPHAGOGASTRODUODENOSCOPY (EGD) WITH PROPOFOL;  Surgeon: Gatha Mayer, MD;  Location: [facility];  Service: Endoscopy;  Laterality: N/A;   GASTROSTOMY TUBE PLACEMENT  09/27/2016   GIVENS CAPSULE STUDY N/A 04/24/2019   Procedure: GIVENS CAPSULE STUDY;  Surgeon: Gatha Mayer, MD;  Location: [facility];  Service: Endoscopy;  Laterality: N/A;   HERNIA REPAIR Left 1981   IR PATIENT EVAL TECH 0-60 MINS  12/13/2016   IR PATIENT EVAL TECH 0-60 MINS  04/11/2017   IR PATIENT EVAL TECH 0-60 MINS  03/24/2018   IR PATIENT EVAL TECH 0-60 MINS  11/23/2018   IR PATIENT EVAL TECH 0-60 MINS  05/28/2019   IR REPLACE G-TUBE SIMPLE WO FLUORO  12/12/2017   IR REPLACE G-TUBE SIMPLE WO FLUORO  03/29/2018   IR REPLACE G-TUBE SIMPLE WO FLUORO  06/29/2018   IR REPLACE G-TUBE SIMPLE WO FLUORO  11/02/2018   IR REPLACE G-TUBE SIMPLE WO FLUORO  03/08/2019   IR REPLACE G-TUBE SIMPLE WO FLUORO  07/02/2019   IR REPLACE G-TUBE SIMPLE WO FLUORO  11/30/2019  IR REPLACE G-TUBE SIMPLE WO FLUORO  12/27/2019   IR REPLACE G-TUBE SIMPLE WO FLUORO  05/27/2020   IR REPLACE G-TUBE SIMPLE WO FLUORO  07/09/2020   MODIFIED RADICAL NECK DISSECTION Left 09/27/2016   Levels 1 & 2   PARTIAL GLOSSECTOMY Left 09/27/2016   Left hemi partial glossectomy   SPINE SURGERY  01/20/2016   fusion   TONSILLECTOMY     tracheotomy  09/27/2016   TUBAL LIGATION  06/1988    There were no vitals filed for this visit.   Subjective Assessment - 07/11/20 1148     Subjective Savahna arrived 18 minutes late for her 0930 appointment. Pt brought food journal but only until Saturday - "I don't have any more sheets."    Currently in Pain? No/denies                   ADULT SLP TREATMENT - 07/11/20 0001       General Information   Behavior/Cognition Alert;Cooperative;Pleasant mood       Treatment Provided   Treatment provided Dysphagia;Cognitive-Linquistic      Dysphagia Treatment   Temperature Spikes Noted No    Respiratory Status Room air    Oral Cavity - Dentition Edentulous    Treatment Methods Skilled observation;Therapeutic exercise;Patient/caregiver education    Patient observed directly with PO's Yes    Type of PO's observed --   sprite-carbonated thin   Liquids provided via --   bottle   Other treatment/comments HEP for dysphagia completed with modified independence- Mendelson was rare min A faded to modified independence. CTAR not completed - pt reports she is completing at home.      Cognitive-Linquistic Treatment   Treatment focused on Dysarthria;Patient/family/caregiver education    Skilled Treatment Mayeli reports she is continuing to complete HEP for dysarthria at home regularly using rhyming sentences  In Today she used overarticulation in structured tasks requiring sentence responses with occasional min A. In 5 minutes simple to mod complex converation, pt demonstrated carryover of speech compensations with rare min A. Her mother has commented this week that she is easier to understand and she is not asking Kearstyn to repeat as often as prior to [place].      Assessment / Recommendations / Plan   Plan Continue with current plan of care      Dysphagia Recommendations   Diet recommendations Dysphagia 3 (mechanical soft);Dysphagia 2 (fine chop);Thin liquid    Liquids provided via Straw    Medication Administration Crushed with puree    Supervision Patient able to self feed    Compensations Slow rate;Small sips/bites;Effortful swallow;Multiple dry swallows after each bite/sip;Follow solids with liquid    Postural Changes and/or Swallow Maneuvers Seated upright 90 degrees;Upright 30-60 min after meal      Progression Toward Goals   Progression toward goals Progressing toward goals                SLP Short Term Goals - 07/11/20 1156       SLP SHORT  TERM GOAL #1   Title pt will complete HEP with rare min A over two sessions    Baseline 06/23/20;    Status Achieved      SLP SHORT TERM GOAL #2   Title Pt will keep a food journal over 2 weeks with  rare min A to promote improved balanced diet and advanced consistency.    Time 2    Period Weeks    Status On-going      SLP  SHORT TERM GOAL #3   Title Pt will add 2 new high protien/fat foods to her diet rotation over 3 weeks    Time 2    Period Weeks    Status On-going      SLP SHORT TERM GOAL #4   Title Pt will utilize 3 compensatory strategies (internal and environmental)  for dysarthira 18/20 sentence responses in structured task    Status Achieved      SLP SHORT TERM GOAL #5   Title Pt will complete pt reported outcome survey for dysarthria    Time 1    Period Weeks    Status On-going              SLP Long Term Goals - 07/11/20 1156       SLP LONG TERM GOAL #1   Title pt will complete HEP with modified independence over three sessions     Time 6    Period Weeks    Status On-going      SLP LONG TERM GOAL #2   Title Pt will add 4 new foods (protien/healthy fat) to her diet rotation over 6 weeks    Time 6    Period Weeks    Status On-going      SLP LONG TERM GOAL #3   Title Pt will maintain food journal over 5 weeks to promote improved variety of diet and advance consisteny    Time 6    Period Weeks    Status On-going      SLP LONG TERM GOAL #4   Title Pt will improve score of communciation PRO by 2 points    Time 6    Period Weeks    Status On-going      SLP LONG TERM GOAL #5   Title Pt will carryover strategies for dysarthriaand vocal efficiency to be intelligilble in 15 minute conversation in mildly noisy environment    Time 6    Period Weeks    Status On-going              Plan - 07/11/20 1155     Clinical Impression Statement Vauda continues to use food diary to eat 3x a day and adding protien more regularly. She is carring over  compensations for dysarthtria with family and reports improve intellgibility and reduced frustration. Ongoing training for HEP for dysarthria and dysphagia. Attendance continues inconsistent as she has no showed and cancelled 4 sessions - today she was almost 20 minutes late to this appointment.  Continue skilled ST to maximize safety of swallow, nutrtion/hydration and intelligiblity for successful particiation at home and in community.    Speech Therapy Frequency 2x / week    Duration 8 weeks   17 visits   Treatment/Interventions Aspiration precaution training;Environmental controls;Cueing hierarchy;Pharyngeal strengthening exercises;Compensatory techniques;Functional tasks;Compensatory strategies;SLP instruction and feedback;Oral motor exercises;Patient/family education;Multimodal communcation approach;Internal/external aids;Trials of upgraded texture/liquids;Diet toleration management by SLP;Other (comment)    Potential to Achieve Goals Fair    Potential Considerations Previous level of function;Co-morbidities             Patient will benefit from skilled therapeutic intervention in order to improve the following deficits and impairments:   Dysphagia, oropharyngeal phase  Dysarthria and anarthria    Problem List Patient Active Problem List   Diagnosis Date Noted   Alcohol use 08/05/2019   Bilateral lower extremity edema 08/05/2019   Peripheral edema 03/12/2019   Iron deficiency anemia 12/11/2018   Loosening of hardware in spine ([place])  Other spondylosis with radiculopathy, lumbar region    History of lumbar spinal fusion 11/06/2018   UTI (urinary tract infection) 05/15/2017   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   Malignant neoplasm of base of tongue ([place]) 10/29/2016   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[place]) 10/29/2016   Headache associated with sexual activity 05/14/2016   Tongue lesion 05/14/2016   Thrombocytopenia ([place]) 01/22/2016   Chronic  diarrhea 01/22/2016   Chest pain    Essential hypertension    Spinal stenosis of lumbar region 01/20/2016    Class: Chronic   DDD (degenerative disc disease), lumbar 01/20/2016    Class: Chronic   Spinal stenosis of lumbar region with neurogenic claudication 01/20/2016   Low back pain 12/22/2015   Hypersomnolence 10/13/2015   Muscle cramp 08/01/2015   GERD (gastroesophageal reflux disease) 07/09/2015   Morbid obesity with BMI of 40.0-44.9, adult ([place]) 01/08/2013    [facility] ,[place], [place]  07/11/2020, 11:57 AM  [facility] [address] [place], [place], [address] Phone: [phone]   Fax:  [phone]   Name: JEARLENE BRIDWELL MRN: [id number] Date of Birth: 25-Jun-1965

## 2020-07-14 ENCOUNTER — Encounter: Payer: BC Managed Care – PPO | Admitting: Speech Pathology

## 2020-07-15 DIAGNOSIS — Z20822 Contact with and (suspected) exposure to covid-19: Secondary | ICD-10-CM | POA: Diagnosis not present

## 2020-07-16 ENCOUNTER — Other Ambulatory Visit: Payer: Self-pay

## 2020-07-16 ENCOUNTER — Ambulatory Visit: Payer: Medicare Other

## 2020-07-18 ENCOUNTER — Ambulatory Visit: Payer: Medicare Other

## 2020-07-19 DIAGNOSIS — Z20822 Contact with and (suspected) exposure to covid-19: Secondary | ICD-10-CM | POA: Diagnosis not present

## 2020-07-23 ENCOUNTER — Ambulatory Visit: Payer: Medicare Other | Admitting: Speech Pathology

## 2020-07-24 ENCOUNTER — Encounter (HOSPITAL_COMMUNITY): Payer: Self-pay

## 2020-07-24 ENCOUNTER — Other Ambulatory Visit: Payer: Self-pay

## 2020-07-24 ENCOUNTER — Observation Stay (HOSPITAL_COMMUNITY)
Admission: EM | Admit: 2020-07-24 | Discharge: 2020-07-26 | Disposition: A | Discharge status: Home / Self Care | Payer: Medicare Other | Attending: Emergency Medicine | Admitting: Emergency Medicine

## 2020-07-24 ENCOUNTER — Emergency Department (HOSPITAL_COMMUNITY): Payer: Medicare Other

## 2020-07-24 DIAGNOSIS — K760 Fatty (change of) liver, not elsewhere classified: Secondary | ICD-10-CM | POA: Diagnosis not present

## 2020-07-24 DIAGNOSIS — Z7982 Long term (current) use of aspirin: Secondary | ICD-10-CM | POA: Insufficient documentation

## 2020-07-24 DIAGNOSIS — E875 Hyperkalemia: Secondary | ICD-10-CM | POA: Diagnosis not present

## 2020-07-24 DIAGNOSIS — R197 Diarrhea, unspecified: Secondary | ICD-10-CM

## 2020-07-24 DIAGNOSIS — Z20822 Contact with and (suspected) exposure to covid-19: Secondary | ICD-10-CM | POA: Insufficient documentation

## 2020-07-24 DIAGNOSIS — G4710 Hypersomnia, unspecified: Secondary | ICD-10-CM | POA: Diagnosis present

## 2020-07-24 DIAGNOSIS — R000 Tachycardia, unspecified: Secondary | ICD-10-CM | POA: Diagnosis not present

## 2020-07-24 DIAGNOSIS — R8290 Unspecified abnormal findings in urine: Secondary | ICD-10-CM | POA: Diagnosis not present

## 2020-07-24 DIAGNOSIS — R109 Unspecified abdominal pain: Secondary | ICD-10-CM | POA: Insufficient documentation

## 2020-07-24 DIAGNOSIS — Z85810 Personal history of malignant neoplasm of tongue: Secondary | ICD-10-CM | POA: Diagnosis not present

## 2020-07-24 DIAGNOSIS — I10 Essential (primary) hypertension: Secondary | ICD-10-CM | POA: Diagnosis not present

## 2020-07-24 DIAGNOSIS — K219 Gastro-esophageal reflux disease without esophagitis: Secondary | ICD-10-CM | POA: Diagnosis present

## 2020-07-24 DIAGNOSIS — G894 Chronic pain syndrome: Secondary | ICD-10-CM | POA: Diagnosis not present

## 2020-07-24 DIAGNOSIS — Z79899 Other long term (current) drug therapy: Secondary | ICD-10-CM | POA: Insufficient documentation

## 2020-07-24 DIAGNOSIS — K529 Noninfective gastroenteritis and colitis, unspecified: Secondary | ICD-10-CM | POA: Diagnosis present

## 2020-07-24 LAB — CBG MONITORING, ED
Glucose-Capillary: 181 mg/dL — ABNORMAL HIGH (ref 70–99)
Glucose-Capillary: 54 mg/dL — ABNORMAL LOW (ref 70–99)

## 2020-07-24 LAB — URINALYSIS, ROUTINE W REFLEX MICROSCOPIC
Bacteria, UA: NONE SEEN
Bilirubin Urine: NEGATIVE
Glucose, UA: NEGATIVE mg/dL
Ketones, ur: NEGATIVE mg/dL
Nitrite: NEGATIVE
Protein, ur: NEGATIVE mg/dL
Specific Gravity, Urine: 1.03 (ref 1.005–1.030)
pH: 5 (ref 5.0–8.0)

## 2020-07-24 LAB — CBC
HCT: 54.9 % — ABNORMAL HIGH (ref 36.0–46.0)
Hemoglobin: 17.5 g/dL — ABNORMAL HIGH (ref 12.0–15.0)
MCH: 29 pg (ref 26.0–34.0)
MCHC: 31.9 g/dL (ref 30.0–36.0)
MCV: 90.9 fL (ref 80.0–100.0)
Platelets: 240 10*3/uL (ref 150–400)
RBC: 6.04 MIL/uL — ABNORMAL HIGH (ref 3.87–5.11)
RDW: 13.3 % (ref 11.5–15.5)
WBC: 4.4 10*3/uL (ref 4.0–10.5)
nRBC: 0 % (ref 0.0–0.2)

## 2020-07-24 LAB — COMPREHENSIVE METABOLIC PANEL
ALT: 16 U/L (ref 0–44)
AST: 19 U/L (ref 15–41)
Albumin: 5 g/dL (ref 3.5–5.0)
Alkaline Phosphatase: 94 U/L (ref 38–126)
Anion gap: 7 (ref 5–15)
BUN: 9 mg/dL (ref 6–20)
CO2: 18 mmol/L — ABNORMAL LOW (ref 22–32)
Calcium: 11.4 mg/dL — ABNORMAL HIGH (ref 8.9–10.3)
Chloride: 112 mmol/L — ABNORMAL HIGH (ref 98–111)
Creatinine, Ser: 0.94 mg/dL (ref 0.44–1.00)
GFR, Estimated: >60 mL/min (ref >60)
Glucose, Bld: 111 mg/dL — ABNORMAL HIGH (ref 70–99)
Potassium: 7.3 mmol/L (ref 3.5–5.1)
Sodium: 137 mmol/L (ref 135–145)
Total Bilirubin: 1.1 mg/dL (ref 0.3–1.2)
Total Protein: 8.9 g/dL — ABNORMAL HIGH (ref 6.5–8.1)

## 2020-07-24 LAB — BASIC METABOLIC PANEL
Anion gap: 5 (ref 5–15)
BUN: 8 mg/dL (ref 6–20)
CO2: 14 mmol/L — ABNORMAL LOW (ref 22–32)
Calcium: 9 mg/dL (ref 8.9–10.3)
Chloride: 118 mmol/L — ABNORMAL HIGH (ref 98–111)
Creatinine, Ser: 0.65 mg/dL (ref 0.44–1.00)
GFR, Estimated: >60 mL/min (ref >60)
Glucose, Bld: 98 mg/dL (ref 70–99)
Potassium: 6.3 mmol/L (ref 3.5–5.1)
Sodium: 137 mmol/L (ref 135–145)

## 2020-07-24 LAB — GLUCOSE, RANDOM: Glucose, Bld: 257 mg/dL — ABNORMAL HIGH (ref 70–99)

## 2020-07-24 LAB — CK: Total CK: 69 U/L (ref 38–234)

## 2020-07-24 LAB — LIPASE, BLOOD: Lipase: 26 U/L (ref 11–51)

## 2020-07-24 MED ORDER — INSULIN ASPART 100 UNIT/ML IV SOLN
10.0000 [IU] | Freq: Once | INTRAVENOUS | Status: AC
  Administered 2020-07-24: 10 [IU] via INTRAVENOUS
  Filled 2020-07-24: qty 0.1

## 2020-07-24 MED ORDER — DEXTROSE 50 % IV SOLN
1.0000 | Freq: Once | INTRAVENOUS | Status: AC
  Administered 2020-07-24: 50 mL via INTRAVENOUS
  Filled 2020-07-24: qty 50

## 2020-07-24 MED ORDER — IOHEXOL 350 MG/ML SOLN
80.0000 mL | Freq: Once | INTRAVENOUS | Status: AC | PRN
  Administered 2020-07-24: 80 mL via INTRAVENOUS

## 2020-07-24 MED ORDER — DEXTROSE 50 % IV SOLN
INTRAVENOUS | Status: AC
  Filled 2020-07-24: qty 50

## 2020-07-24 MED ORDER — SODIUM CHLORIDE 0.9 % IV BOLUS
1000.0000 mL | Freq: Once | INTRAVENOUS | Status: AC
  Administered 2020-07-24: 1000 mL via INTRAVENOUS

## 2020-07-24 NOTE — ED Triage Notes (Signed)
Patient c/o generalized abdominal cramping and diarrhea x 4 days. Patient also c/o bilateral leg cramping and a headache.

## 2020-07-24 NOTE — ED Notes (Signed)
Per Benedetto Goad PA, pt given an AMP of dextrose.

## 2020-07-24 NOTE — ED Provider Notes (Signed)
Emergency Medicine Provider Triage Evaluation Note  Kristen [name] , a 55 y.o. female  was evaluated in triage.  Pt complains of diarrhea.  Review of Systems  Positive: Abd cramping, diarrhea, leg cramps, headache, nasal congestion Negative: Fever, cough, sob, cp, dysuria, recent abx use  Physical Exam  BP 122/87 (BP Location: Left Arm)   Pulse 80   Temp 98.1 F (36.7 C) (Oral)   Resp 16   Ht 5' 9"  (1.753 m)   Wt 58.5 kg   LMP 11/15/2013   SpO2 99%   BMI 19.04 kg/m  Gen:   Awake, appears tired  Resp:  Normal effort  MSK:   Moves extremities without difficulty  Other:    Medical Decision Making  Medically screening exam initiated at 4:39 PM.  Appropriate orders placed.  Katarzyna L Retana was informed that the remainder of the evaluation will be completed by another provider, this initial triage assessment does not replace that evaluation, and the importance of remaining in the ED until their evaluation is complete.  Endorse generalized abdominal cramping and recurrent diarrhea for the past 4 days as well as having headache and leg cramping.  Has been vaccinated for COVID-19, no recent sick contact, no recent antibiotic use.   Domenic Moras, PA-C 07/24/20 1644    Carmin Muskrat, MD 07/24/20 [phone]

## 2020-07-24 NOTE — ED Provider Notes (Signed)
[facility] DEPT Provider Note   CSN: [id number] Arrival date & time: 07/24/20  1544     History Chief Complaint  Patient presents with   Diarrhea   Abdominal Pain    Kristen [name] is a 55 y.o. female.  Kristen [name] is a 55 y.o. female with a history of hypertension, migraines, sleep apnea, chronic diarrhea, G-tube, tongue cancer, who presents to the emergency department with chief complaint of diarrhea, abdominal pain and lower leg pain for the past 4 days (symptom onset Monday). Pt reports a history of chronic diarrhea that she typically treats by by taking Lomotil and Imodium-AD, however she ran out of these medications and has not taken them since Sunday. She reports she does not currently have the finances to purchase the OTC Imodium-AD. Pt states she has been having 3 episodes of uncontrollable watery diarrhea per day, no blood in stool.  She usually does not have any BMs throughout the day.  She also reports diffuse cramping abdominal pain that is worse right before she has to have a bowel movement.  She reports she does not typically have pain like this and today it has been more uncomfortable.  She has been nauseated but has not had any vomiting.  She describes the leg pain as cramping from her feet to her mid-thighs that worsens upon walking. She reports she has no history of this and has never been diagnosed with PAD. She also reports drinking only ~16oz H2O per day. Pt endorses alcohol use but denies any in the past week, denies tobacco or other illicit drug use. She denies fever, chills, nausea/vomiting, numbness/tingling.  No lightheadedness or syncope.  The history is provided by the patient.      Past Medical History:  Diagnosis Date   Allergy    Anemia    Iron deficiency   Anxiety    Arthritis    back, left shoulder   Blood transfusion without reported diagnosis    Chicken pox    Depression    Diarrhea    takes Imodium daily    GERD (gastroesophageal reflux disease)    H/O hiatal hernia    Headache(784.0)    History of kidney stones    1996ish   History of radiation therapy 11/16/16- 01/01/17   Base of Tongue/ 66 gy in 33 fractions/ Dose: 2 Gy   Hypertension    Iron deficiency anemia 12/11/2018   Migraine    none for 5 years (as of 01/13/16)   OSA (obstructive sleep apnea) 10/13/2015   unable to get cpap, plans to get one in 2018   Pneumonia    Restless legs    Scoliosis    Shingles 08/28/2013   Shortness of breath    with exertion   Sleep apnea    Tongue cancer ([place])    tongue cancer    Patient Active Problem List   Diagnosis Date Noted   Hyperkalemia 07/24/2020   Alcohol use 08/05/2019   Bilateral lower extremity edema 08/05/2019   Peripheral edema 03/12/2019   Iron deficiency anemia 12/11/2018   Loosening of hardware in spine [facility])    Other spondylosis with radiculopathy, lumbar region    History of lumbar spinal fusion 11/06/2018   UTI (urinary tract infection) 05/15/2017   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   Malignant neoplasm of base of tongue ([place]) 10/29/2016   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[place]) 10/29/2016   Headache associated with sexual  activity 05/14/2016   Tongue lesion 05/14/2016   Thrombocytopenia ([place]) 01/22/2016   Chronic diarrhea 01/22/2016   Chest pain    Essential hypertension    Spinal stenosis of lumbar region 01/20/2016    Class: Chronic   DDD (degenerative disc disease), lumbar 01/20/2016    Class: Chronic   Spinal stenosis of lumbar region with neurogenic claudication 01/20/2016   Low back pain 12/22/2015   Hypersomnolence 10/13/2015   Muscle cramp 08/01/2015   GERD (gastroesophageal reflux disease) 07/09/2015   Morbid obesity with BMI of 40.0-44.9, adult ([place]) 01/08/2013    Past Surgical History:  Procedure Laterality Date   BACK SURGERY  07/21/1978   CHOLECYSTECTOMY N/A 08/15/2013   Procedure: LAPAROSCOPIC CHOLECYSTECTOMY  WITH INTRAOPERATIVE CHOLANGIOGRAM;  Surgeon: Gwenyth Ober, MD;  Location: [facility];  Service: General;  Laterality: N/A;   COLONOSCOPY N/A 01/09/2013   Procedure: COLONOSCOPY;  Surgeon: Beryle Beams, MD;  Location: [facility];  Service: Endoscopy;  Laterality: N/A;   COLONOSCOPY  03/2018   DIRECT LARYNGOSCOPY  07/2016   Dr. Nicolette Bang [facility]   ESOPHAGOGASTRODUODENOSCOPY  03/2018   ESOPHAGOGASTRODUODENOSCOPY (EGD) WITH PROPOFOL N/A 04/24/2019   Procedure: ESOPHAGOGASTRODUODENOSCOPY (EGD) WITH PROPOFOL;  Surgeon: Gatha Mayer, MD;  Location: [facility];  Service: Endoscopy;  Laterality: N/A;   GASTROSTOMY TUBE PLACEMENT  09/27/2016   GIVENS CAPSULE STUDY N/A 04/24/2019   Procedure: GIVENS CAPSULE STUDY;  Surgeon: Gatha Mayer, MD;  Location: [facility];  Service: Endoscopy;  Laterality: N/A;   HERNIA REPAIR Left 1981   IR PATIENT EVAL TECH 0-60 MINS  12/13/2016   IR PATIENT EVAL TECH 0-60 MINS  04/11/2017   IR PATIENT EVAL TECH 0-60 MINS  03/24/2018   IR PATIENT EVAL TECH 0-60 MINS  11/23/2018   IR PATIENT EVAL TECH 0-60 MINS  05/28/2019   IR REPLACE G-TUBE SIMPLE WO FLUORO  12/12/2017   IR REPLACE G-TUBE SIMPLE WO FLUORO  03/29/2018   IR REPLACE G-TUBE SIMPLE WO FLUORO  06/29/2018   IR REPLACE G-TUBE SIMPLE WO FLUORO  11/02/2018   IR REPLACE G-TUBE SIMPLE WO FLUORO  03/08/2019   IR REPLACE G-TUBE SIMPLE WO FLUORO  07/02/2019   IR REPLACE G-TUBE SIMPLE WO FLUORO  11/30/2019   IR REPLACE G-TUBE SIMPLE WO FLUORO  12/27/2019   IR REPLACE G-TUBE SIMPLE WO FLUORO  05/27/2020   IR REPLACE G-TUBE SIMPLE WO FLUORO  07/09/2020   MODIFIED RADICAL NECK DISSECTION Left 09/27/2016   Levels 1 & 2   PARTIAL GLOSSECTOMY Left 09/27/2016   Left hemi partial glossectomy   SPINE SURGERY  01/20/2016   fusion   TONSILLECTOMY     tracheotomy  09/27/2016   TUBAL LIGATION  06/1988     OB History   No obstetric history on file.     Family History  Problem Relation Age of Onset   Heart disease Father     Heart attack Father    Hypertension Mother    Arthritis Mother    Diabetes Maternal Grandmother    Diabetes Paternal Grandmother    Diabetes Maternal Uncle    Colon polyps Neg Hx    Colon cancer Neg Hx    Esophageal cancer Neg Hx    Stomach cancer Neg Hx    Rectal cancer Neg Hx     Social History   Tobacco Use   Smoking status: Never   Smokeless tobacco: Never  Vaping Use   Vaping Use: Never used  Substance Use Topics   Alcohol use:  Yes    Alcohol/week: 7.0 standard drinks    Types: 7 Cans of beer per week   Drug use: No    Home Medications Prior to Admission medications   Medication Sig Start Date End Date Taking? Authorizing Provider  amLODipine (NORVASC) 10 MG tablet TAKE 1 TABLET BY MOUTH EVERY DAY Patient taking differently: Place 10 mg into feeding tube daily. 06/06/20  Yes [name], MD  amphetamine-dextroamphetamine (ADDERALL) 10 MG tablet 1-3 daily as needed for sleepiness Patient taking differently: Place 10 mg into feeding tube daily. 09/10/19  Yes Young, Tarri Fuller D, MD  aspirin 81 MG chewable tablet Place 81 mg into feeding tube daily.  10/05/16  Yes [provider]  baclofen (LIORESAL) 10 MG tablet PLACE 1 TABLET VIA FEEDING TUBE THREE TIMES DAILY Patient taking differently: Place 10 mg into feeding tube 3 (three) times daily. 12/07/19  Yes Jessy Oto, MD  Calcium Citrate 200 MG TABS Place 200 mg into feeding tube daily at 12 noon.   Yes [provider]  clotrimazole (MYCELEX) 10 MG troche Take 10 mg by mouth daily as needed (infection). Per tube 05/06/20  Yes [provider]  DEXILANT 60 MG capsule Place 60 capsules into feeding tube daily. 08/22/19  Yes [provider]  diclofenac (VOLTAREN) 75 MG EC tablet TAKE 1 TABLET BY MOUTH TWICE DAILY WITH FOOD AS NEEDED FOR PAIN Patient taking differently: Take 75 mg by mouth 2 (two) times daily. Per tube 01/30/20  Yes Jessy Oto, MD  diphenoxylate-atropine (LOMOTIL) 2.5-0.025  MG tablet PLACE 2 TABLETS INTO FEEDING TUBE TWICE DAILY Patient taking differently: Place 2 tablets into feeding tube 2 (two) times daily. 06/11/20  Yes Gatha [facility], MD  ferrous sulfate 220 (44 Fe) MG/5ML solution PLACE 6.8MLS(300MG) INTO FEEDING TUBE DAILY Patient taking differently: Place 220 mg into feeding tube daily. 11/16/19  Yes Gatha Mayer, MD  folic acid (FOLVITE) 956 MCG tablet Place 400 mcg into feeding tube daily.   Yes [provider]  magic mouthwash w/lidocaine SOLN Swish and or swallow 10 mL up to QID prn. Patient taking differently: Take 10 mLs by mouth daily as needed for mouth pain. Swish and or swallow 10 mL up to QID prn. 03/28/20  Yes Eppie [facility], MD  meloxicam (MOBIC) 15 MG tablet TAKE 1 TABLET(15 MG) BY MOUTH DAILY Patient taking differently: Place 15 mg into feeding tube daily. 04/14/20  Yes Jessy Oto, MD  Multiple Vitamin (MULTIVITAMIN WITH MINERALS) TABS tablet Place 1 tablet into feeding tube daily. Patient taking differently: Place 1 tablet into feeding tube daily. Centrum 11/10/18  Yes Jessy Oto, MD  potassium chloride SA (KLOR-CON) 20 MEQ tablet TAKE 1 TABLET BY MOUTH TWICE DAILY FOR THE NEXT 3 DAYS. OKAY TO CRUSH IF UNABLE TO SWALLOW. CAN ALSO TAKE PER TUBE IF NEEDED. Patient taking differently: Take 20 mEq by mouth daily. Per tube 08/13/19  Yes [name], MD  pregabalin (LYRICA) 75 MG capsule TAKE 1 CAPSULE(75 MG) BY MOUTH TWICE DAILY Patient taking differently: Place 75 mg into feeding tube 2 (two) times daily. 06/06/20  Yes Jessy Oto, MD  traMADol (ULTRAM) 50 MG tablet TAKE 1 TABLET(50 MG) BY MOUTH EVERY 6 HOURS AS NEEDED Patient taking differently: Place 50 mg into feeding tube daily as needed for moderate pain. 06/06/20  Yes Jessy Oto, MD  diclofenac Sodium (VOLTAREN) 1 % GEL Apply 2 g topically 4 (four) times daily. Patient not taking: No sig  reported 12/07/18   Jessy Oto, MD  Elastic Bandages & Supports (JOBST KNEE  HIGH COMPRESSION SM) MISC 1 Units by Does not apply route daily. 08/24/19   Jessy Oto, MD  fluconazole (DIFLUCAN) 100 MG tablet Take 2 tablets today, then 1 tablet daily x 13 more days. Patient not taking: No sig reported 03/28/20   Eppie [facility], MD  hydrocortisone cream 1 % APPLY EXTERNALLY TO THE AFFECTED AREA TWICE DAILY Patient not taking: No sig reported 06/16/18   [name], MD  SUMAtriptan (IMITREX) 100 MG tablet Take 0.5-1 tablet by mouth at the onset of migraine and may repeat in 2 hours if headache persists or recurs. Patient not taking: No sig reported 11/09/18   Jessy Oto, MD    Allergies    Elemental sulfur and Other  Review of Systems   Review of Systems  Constitutional:  Positive for fatigue. Negative for chills and fever.  HENT: Negative.    Respiratory:  Negative for cough and shortness of breath.   Cardiovascular:  Negative for chest pain.  Gastrointestinal:  Positive for abdominal pain, diarrhea and nausea. Negative for vomiting.  Genitourinary:  Negative for dysuria and frequency.  Musculoskeletal:  Positive for myalgias. Negative for arthralgias.  Skin:  Negative for color change and rash.  Neurological:  Negative for dizziness, syncope and light-headedness.  All other systems reviewed and are negative.  Physical Exam Updated Vital Signs BP 96/62   Pulse 99   Temp 98.1 F (36.7 C) (Oral)   Resp 13   Ht 5' 9"  (1.753 m)   Wt 58.5 kg   LMP 11/15/2013   SpO2 97%   BMI 19.04 kg/m   Physical Exam Vitals and nursing note reviewed.  Constitutional:      General: She is not in acute distress.    Appearance: Normal appearance. She is well-developed. She is ill-appearing. She is not diaphoretic.  HENT:     Head: Normocephalic and atraumatic.     Mouth/Throat:     Comments: Mucous membranes dry Eyes:     General:        Right eye: No discharge.        Left eye: No discharge.     Pupils: Pupils are equal, round, and reactive to light.   Cardiovascular:     Rate and Rhythm: Normal rate and regular rhythm.     Pulses: Normal pulses.     Heart sounds: Normal heart sounds.  Pulmonary:     Effort: Pulmonary effort is normal. No respiratory distress.     Breath sounds: Normal breath sounds. No wheezing or rales.     Comments: Respirations equal and unlabored, patient able to speak in full sentences, lungs clear to auscultation bilaterally  Abdominal:     General: Bowel sounds are normal. There is no distension.     Palpations: Abdomen is soft. There is no mass.     Tenderness: There is generalized abdominal tenderness. There is no guarding.     Comments: G tube present without surrounding erythema or drainage, abdomen soft, bowel sounds present throughout, abdomen with mild generalized tenderness without guarding.  Musculoskeletal:        General: No deformity.     Cervical back: Neck supple.  Skin:    General: Skin is warm and dry.     Capillary Refill: Capillary refill takes less than 2 seconds.  Neurological:     Mental Status: She is alert and oriented to person, place, and  time.     Coordination: Coordination normal.     Comments: Speech is clear, able to follow commands Moves extremities without ataxia, coordination intact  Psychiatric:        Mood and Affect: Mood normal.        Behavior: Behavior normal.    ED Results / Procedures / Treatments   Labs (all labs ordered are listed, but only abnormal results are displayed) Labs Reviewed  COMPREHENSIVE METABOLIC PANEL - Abnormal; Notable for the following components:      Result Value   Potassium 7.3 (*)    Chloride 112 (*)    CO2 18 (*)    Glucose, Bld 111 (*)    Calcium 11.4 (*)    Total Protein 8.9 (*)    All other components within normal limits  CBC - Abnormal; Notable for the following components:   RBC 6.04 (*)    Hemoglobin 17.5 (*)    HCT 54.9 (*)    All other components within normal limits  URINALYSIS, ROUTINE W REFLEX MICROSCOPIC -  Abnormal; Notable for the following components:   Hgb urine dipstick SMALL (*)    Leukocytes,Ua LARGE (*)    All other components within normal limits  BASIC METABOLIC PANEL - Abnormal; Notable for the following components:   Potassium 6.3 (*)    Chloride 118 (*)    CO2 14 (*)    All other components within normal limits  GLUCOSE, RANDOM - Abnormal; Notable for the following components:   Glucose, Bld 257 (*)    All other components within normal limits  CBG MONITORING, ED - Abnormal; Notable for the following components:   Glucose-Capillary 54 (*)    All other components within normal limits  CBG MONITORING, ED - Abnormal; Notable for the following components:   Glucose-Capillary 181 (*)    All other components within normal limits  SARS CORONAVIRUS 2 (TAT 6-24 HRS)  LIPASE, BLOOD  CK    EKG EKG Interpretation  Date/Time:  Thursday July 24 2020 19:29:37 EDT Ventricular Rate:  104 PR Interval:  158 QRS Duration: 94 QT Interval:  321 QTC Calculation: 423 R Axis:   -59 Text Interpretation: Sinus tachycardia LAD, consider left anterior fascicular block Probable left ventricular hypertrophy Since last tracing rate faster Confirmed by Isla Pence [phone]) on 07/24/2020 7:33:26 PM  Radiology CT Abdomen Pelvis W Contrast  Result Date: 07/24/2020 CLINICAL DATA:  Nonlocalized acute abdominal pain. EXAM: CT ABDOMEN AND PELVIS WITH CONTRAST TECHNIQUE: Multidetector CT imaging of the abdomen and pelvis was performed using the standard protocol following bolus administration of intravenous contrast. CONTRAST:  35m OMNIPAQUE IOHEXOL 350 MG/ML SOLN COMPARISON:  CT abdomen pelvis 03/22/2019 FINDINGS: Lower chest: No acute abnormality. Hepatobiliary: The hepatic parenchyma is diffusely hypodense compared to the splenic parenchyma consistent with fatty infiltration. No focal liver abnormality. Status post cholecystectomy. No biliary dilatation. Pancreas: Diffusely atrophic. No focal lesion.  Otherwise normal pancreatic contour. No surrounding inflammatory changes. No main pancreatic ductal dilatation. Spleen: Normal in size without focal abnormality. Adrenals/Urinary Tract: No definite adrenal nodule bilaterally. Renal cortical scarring bilaterally. Bilateral kidneys enhance symmetrically. Fluid density lesion within the left kidney appears similar and likely represents a simple renal cyst. 5 mm calcified stone within the left kidney. No hydronephrosis. No hydroureter. The urinary bladder is unremarkable. On delayed imaging, there is no urothelial wall thickening and there are no filling defects in the opacified portions of the bilateral collecting systems or ureters. Stomach/Bowel: Gastrostomy tube with tip and balloon  terminating within the gastric lumen. Stomach is within normal limits. No evidence of bowel wall thickening or dilatation. Fatty infiltration of the wall of the ascending colon suggestive of chronic inflammatory changes. Scattered colonic diverticulosis. Appendix appears normal. Vascular/Lymphatic: No abdominal aorta or iliac aneurysm. Mild atherosclerotic plaque of the aorta and its branches. No abdominal, pelvic, or inguinal lymphadenopathy. Reproductive: Uterus and bilateral adnexa are unremarkable. Other: No intraperitoneal free fluid. No intraperitoneal free gas. No organized fluid collection. Musculoskeletal: No abdominal wall hernia or abnormality. No suspicious lytic or blastic osseous lesions. No acute displaced fracture. Redemonstration of thoracolumbar dextroscoliosis with similar-appearing thoracolumbar surgical hardware. Associated severe multilevel degenerative changes of the spine. IMPRESSION: 1. Scattered colonic diverticulosis with no acute diverticulitis. 2. Nonobstructive 5 mm left nephrolithiasis. 3. Hepatic steatosis. 4. Gastrostomy tube in appropriate position. 5.  Aortic Atherosclerosis (ICD10-I70.0). Electronically Signed   By: Iven Finn M.D.   On:  07/24/2020 21:28    Procedures .Critical Care  Date/Time: 07/24/2020 11:24 PM Performed by: Jacqlyn Larsen, PA-C Authorized by: Jacqlyn Larsen, PA-C   Critical care provider statement:    Critical care time (minutes):  45   Critical care was necessary to treat or prevent imminent or life-threatening deterioration of the following conditions:  Metabolic crisis   Critical care was time spent personally by me on the following activities:  Discussions with consultants, evaluation of patient's response to treatment, examination of patient, ordering and performing treatments and interventions, ordering and review of laboratory studies, ordering and review of radiographic studies, pulse oximetry, re-evaluation of patient's condition, obtaining history from patient or surrogate and review of old charts   Care discussed with: admitting provider     Medications Ordered in ED Medications  sodium chloride 0.9 % bolus 1,000 mL (0 mLs Intravenous Stopped 07/24/20 2110)  insulin aspart (novoLOG) injection 10 Units (10 Units Intravenous Given 07/24/20 2116)    And  dextrose 50 % solution 50 mL (50 mLs Intravenous Given 07/24/20 2117)  iohexol (OMNIPAQUE) 350 MG/ML injection 80 mL (80 mLs Intravenous Contrast Given 07/24/20 2101)  dextrose 50 % solution (  Given 07/24/20 2300)    ED Course  I have reviewed the triage vital signs and the nursing notes.  Pertinent labs & imaging results that were available during my care of the patient were reviewed by me and considered in my medical decision making (see chart for details).    MDM Rules/Calculators/A&P                         55 y.o. female presents to the ED with complaints of Diarrhea, abdominal pain and myalgias, this involves an extensive number of treatment options, and is a complaint that carries with it a high risk of complications and morbidity.  The differential diagnosis chronic diarrhea, colitis, gastroenteritis, myalgias may be due to dehydration,  versus rhabdo, distal pulses normal, no swelling or signs of infection  On arrival pt is nontoxic, vitals WNL. Exam significant for generalized abdominal tenderness, muscle aches in both legs without deformity, neurovascularly intact  Additional history obtained from medical records. Previous records obtained and reviewed via EMR  I ordered IV fluids for rehydration.  Lab Tests:  I Ordered, reviewed, and interpreted labs, which included:  CBC: No leukocytosis, elevated hemoglobin, likely in the setting of hemoconcentration as patient appears very dry CMP: Potassium 7.3, CO2 18, glucose 111, calcium 11.4, normal renal function despite hyperkalemia and normal liver function.  Suspect there may  be hemolysis, patient is here with worsening diarrhea, unexpected hypokalemia to be more likely.  We will recheck, but in the meantime we will also check EKG. Lipase: WNL  Repeat BMP: Potassium 6.3, normal renal function, normal calcium.  We will treat for moderate hyperkalemia, no significant EKG changes  Imaging Studies ordered:  I ordered imaging studies which included CT abdomen pelvis, I independently visualized and interpreted imaging which showed no acute findings, scattered diverticulosis, nonobstructive left nephrolithiasis, gastrostomy tube in place  ED Course:   Critical interventions: IV insulin and dextrose for hyperkalemia, suspect this is in the setting of daily potassium supplementation, spironolactone and dehydration.  We will avoid using Lasix or Lokelma to hopefully prevent worsening of diarrhea and dehydration.  Patient will require admission for hyperkalemia, medicine consulted  I consulted Dr. Marlowe Sax with [facility] hospitalist and discussed lab and imaging findings, she will see and admit the patient.    Portions of this note were generated with [employer]. Dictation errors may occur despite best attempts at proofreading.   Final Clinical Impression(s) / ED  Diagnoses Final diagnoses:  Hyperkalemia  Diarrhea, unspecified type    Rx / DC Orders ED Discharge Orders     None        Janet Berlin 07/25/20 Annett Fabian, MD 07/25/20 2311

## 2020-07-25 DIAGNOSIS — E875 Hyperkalemia: Secondary | ICD-10-CM | POA: Diagnosis not present

## 2020-07-25 DIAGNOSIS — G4710 Hypersomnia, unspecified: Secondary | ICD-10-CM

## 2020-07-25 DIAGNOSIS — K529 Noninfective gastroenteritis and colitis, unspecified: Secondary | ICD-10-CM

## 2020-07-25 DIAGNOSIS — K219 Gastro-esophageal reflux disease without esophagitis: Secondary | ICD-10-CM

## 2020-07-25 DIAGNOSIS — I10 Essential (primary) hypertension: Secondary | ICD-10-CM

## 2020-07-25 LAB — RENAL FUNCTION PANEL
Albumin: 4.5 g/dL (ref 3.5–5.0)
Albumin: 3.9 g/dL (ref 3.5–5.0)
Anion gap: 7 (ref 5–15)
Anion gap: 7 (ref 5–15)
BUN: 11 mg/dL (ref 6–20)
BUN: 13 mg/dL (ref 6–20)
CO2: 18 mmol/L — ABNORMAL LOW (ref 22–32)
CO2: 19 mmol/L — ABNORMAL LOW (ref 22–32)
Calcium: 10.5 mg/dL — ABNORMAL HIGH (ref 8.9–10.3)
Calcium: 9.8 mg/dL (ref 8.9–10.3)
Chloride: 109 mmol/L (ref 98–111)
Chloride: 108 mmol/L (ref 98–111)
Creatinine, Ser: 0.86 mg/dL (ref 0.44–1.00)
Creatinine, Ser: 0.98 mg/dL (ref 0.44–1.00)
GFR, Estimated: >60 mL/min (ref >60)
GFR, Estimated: >60 mL/min (ref >60)
Glucose, Bld: 109 mg/dL — ABNORMAL HIGH (ref 70–99)
Glucose, Bld: 101 mg/dL — ABNORMAL HIGH (ref 70–99)
Phosphorus: 4.7 mg/dL — ABNORMAL HIGH (ref 2.5–4.6)
Phosphorus: 6.3 mg/dL — ABNORMAL HIGH (ref 2.5–4.6)
Potassium: 5.3 mmol/L — ABNORMAL HIGH (ref 3.5–5.1)
Potassium: 4.3 mmol/L (ref 3.5–5.1)
Sodium: 134 mmol/L — ABNORMAL LOW (ref 135–145)
Sodium: 134 mmol/L — ABNORMAL LOW (ref 135–145)

## 2020-07-25 LAB — CBC
HCT: 52.8 % — ABNORMAL HIGH (ref 36.0–46.0)
Hemoglobin: 16.8 g/dL — ABNORMAL HIGH (ref 12.0–15.0)
MCH: 29.2 pg (ref 26.0–34.0)
MCHC: 31.8 g/dL (ref 30.0–36.0)
MCV: 91.8 fL (ref 80.0–100.0)
Platelets: 218 10*3/uL (ref 150–400)
RBC: 5.75 MIL/uL — ABNORMAL HIGH (ref 3.87–5.11)
RDW: 13.4 % (ref 11.5–15.5)
WBC: 5 10*3/uL (ref 4.0–10.5)
nRBC: 0 % (ref 0.0–0.2)

## 2020-07-25 LAB — BASIC METABOLIC PANEL
Anion gap: 5 (ref 5–15)
Anion gap: 8 (ref 5–15)
BUN: 10 mg/dL (ref 6–20)
BUN: 11 mg/dL (ref 6–20)
CO2: 19 mmol/L — ABNORMAL LOW (ref 22–32)
CO2: 20 mmol/L — ABNORMAL LOW (ref 22–32)
Calcium: 10 mg/dL (ref 8.9–10.3)
Calcium: 11.1 mg/dL — ABNORMAL HIGH (ref 8.9–10.3)
Chloride: 112 mmol/L — ABNORMAL HIGH (ref 98–111)
Chloride: 109 mmol/L (ref 98–111)
Creatinine, Ser: 0.85 mg/dL (ref 0.44–1.00)
Creatinine, Ser: 0.9 mg/dL (ref 0.44–1.00)
GFR, Estimated: >60 mL/min (ref >60)
GFR, Estimated: >60 mL/min (ref >60)
Glucose, Bld: 254 mg/dL — ABNORMAL HIGH (ref 70–99)
Glucose, Bld: 111 mg/dL — ABNORMAL HIGH (ref 70–99)
Potassium: 4.9 mmol/L (ref 3.5–5.1)
Potassium: 6.3 mmol/L (ref 3.5–5.1)
Sodium: 136 mmol/L (ref 135–145)
Sodium: 137 mmol/L (ref 135–145)

## 2020-07-25 LAB — SARS CORONAVIRUS 2 (TAT 6-24 HRS): SARS Coronavirus 2: NEGATIVE

## 2020-07-25 LAB — HIV ANTIBODY (ROUTINE TESTING W REFLEX): HIV Screen 4th Generation wRfx: NONREACTIVE

## 2020-07-25 MED ORDER — SODIUM CHLORIDE 0.9 % IV SOLN: INTRAVENOUS | Status: AC

## 2020-07-25 MED ORDER — ASPIRIN 81 MG PO CHEW
81.0000 mg | CHEWABLE_TABLET | Freq: Every day | ORAL | Status: DC
  Administered 2020-07-25 – 2020-07-26 (×2): 81 mg
  Filled 2020-07-25 (×2): qty 1

## 2020-07-25 MED ORDER — AMLODIPINE BESYLATE 10 MG PO TABS
10.0000 mg | ORAL_TABLET | Freq: Every day | ORAL | Status: DC
  Administered 2020-07-25 – 2020-07-26 (×2): 10 mg
  Filled 2020-07-25 (×2): qty 1

## 2020-07-25 MED ORDER — ACETAMINOPHEN 325 MG PO TABS
650.0000 mg | ORAL_TABLET | Freq: Four times a day (QID) | ORAL | Status: DC | PRN
  Administered 2020-07-25 – 2020-07-26 (×2): 650 mg
  Filled 2020-07-25 (×2): qty 2

## 2020-07-25 MED ORDER — AMPHETAMINE-DEXTROAMPHETAMINE 10 MG PO TABS: 10.0000 mg | ORAL_TABLET | Freq: Every day | ORAL | Status: DC

## 2020-07-25 MED ORDER — ACETAMINOPHEN 650 MG RE SUPP: 650.0000 mg | Freq: Four times a day (QID) | RECTAL | Status: DC | PRN

## 2020-07-25 MED ORDER — BACLOFEN 10 MG PO TABS
10.0000 mg | ORAL_TABLET | Freq: Three times a day (TID) | ORAL | Status: DC
  Administered 2020-07-25 – 2020-07-26 (×4): 10 mg
  Filled 2020-07-25 (×4): qty 1

## 2020-07-25 MED ORDER — DIPHENOXYLATE-ATROPINE 2.5-0.025 MG PO TABS
2.0000 | ORAL_TABLET | Freq: Two times a day (BID) | ORAL | Status: DC
  Administered 2020-07-25 – 2020-07-26 (×3): 2
  Filled 2020-07-25 (×3): qty 2

## 2020-07-25 MED ORDER — ENOXAPARIN SODIUM 40 MG/0.4ML IJ SOSY
40.0000 mg | PREFILLED_SYRINGE | INTRAMUSCULAR | Status: DC
  Administered 2020-07-25 – 2020-07-26 (×2): 40 mg via SUBCUTANEOUS
  Filled 2020-07-25 (×2): qty 0.4

## 2020-07-25 MED ORDER — PANTOPRAZOLE SODIUM 40 MG PO PACK
40.0000 mg | PACK | Freq: Every day | ORAL | Status: DC
  Administered 2020-07-25: 40 mg
  Filled 2020-07-25 (×3): qty 20

## 2020-07-25 MED ORDER — SODIUM ZIRCONIUM CYCLOSILICATE 5 G PO PACK
5.0000 g | PACK | Freq: Once | ORAL | Status: AC
  Administered 2020-07-25: 5 g via ORAL
  Filled 2020-07-25 (×2): qty 1

## 2020-07-25 MED ORDER — SODIUM ZIRCONIUM CYCLOSILICATE 10 G PO PACK
10.0000 g | PACK | Freq: Every day | ORAL | Status: DC
  Administered 2020-07-25: 10 g
  Filled 2020-07-25 (×3): qty 1

## 2020-07-25 MED ORDER — PREGABALIN 75 MG PO CAPS
75.0000 mg | ORAL_CAPSULE | Freq: Two times a day (BID) | ORAL | Status: DC
  Administered 2020-07-25 – 2020-07-26 (×3): 75 mg
  Filled 2020-07-25 (×3): qty 1

## 2020-07-25 NOTE — Plan of Care (Signed)
General education given.

## 2020-07-25 NOTE — Progress Notes (Signed)
Patient potassium noted to be 6.3 this morning was noted to be 4.9 last night. On call notified of change. Patient is currently alert and oriented watching tv. Normal sinus rhythm on the monitor. Will continue to monitor.

## 2020-07-25 NOTE — Progress Notes (Signed)
PROGRESS NOTE  Brief Narrative: Kristen [name] is a 55 y.o. female with a history of lingual cancer s/p XRT 2018 and G tube, lumbar spinal stenosis, HTN, GERD, chronic diarrhea on potassium supplements, and hypersomnolence who presented to the ED with abdominal and leg cramps as well as worsening diarrhea after running out of lomotil. Labs appeared hemoconcentrated revealing polycythemia (hgb 17.5g/dl), with hyperkalemia (K 7.3), hypercalcemia (Ca 82.9), and metabolic acidosis (bicarb 18, then 14 on recheck). BUN 9, Cr 0.9. CT abdomen/pelvis showed no acute findings. Hyperkalemia was treated with insulin and dextrose but has recurred since admission.  Subjective: Feels abdominal cramps are improved to moderate, intermittent, associated with fecal urge which is near baseline. No palpitations.  Objective: BP 118/81 (BP Location: Left Arm)   Pulse 89   Temp 98 F (36.7 C) (Oral)   Resp 17   Ht 5' 9"  (1.753 m)   Wt 58.5 kg   LMP 11/15/2013   SpO2 100%   BMI 19.04 kg/m   Gen: Nontoxic Pulm: Clear and nonlabored on room air  CV: RRR, no murmur, no JVD, no edema GI: Soft, NT, ND, +BS  Neuro: Alert and oriented. No focal deficits. Skin: G tube site c/d/i  Assessment & Plan: Hyperkalemia:  - Serial RFP. Given persistence, will give lokelma which shouldn't exacerbate diarrhea. - Can give lasix, though pt continues to appear volume down. Will continue IVF.  - Stay on telemetry.   Hypercalcemia: Asymptomatic.  - DC supplements, continue IVF.   Chronic diarrhea: CT abd/pelvis nonacute.  - Restarted lomotil.  - Continue GI follow up. EGD, colonoscopy, VCE were unrevealing without IBD and CT abd/pelvis here unremarkable.   HTN:  - Continue norvasc. Holding spironolactone (which pt wasn't taking anyway)  Hypersomnolence:  - Continue home adderall  Spinal stenosis, chronic back pain:  - continue baclofen, lyrica  Pyuria, asymptomatic.   Patrecia Pour, MD Pager on amion 07/25/2020,  3:07 PM

## 2020-07-25 NOTE — H&P (Signed)
History and Physical    Kristen [name] LNL:[id number] DOB: 11-13-1965 DOA: 07/24/2020  PCP: [name], MD Patient coming from: Home  Chief Complaint: Abdominal pain, diarrhea  HPI: Kristen [name] is a 55 y.o. female with medical history significant of dysphagia secondary to history of tongue cancer status post G-tube, lumbar spinal stenosis, hypertension, hypersomnolence, GERD, chronic diarrhea followed by gastroenterology, GERD presented to the ED with complaints of abdominal cramps, chronic diarrhea, and leg cramps.  Labs showing WBC 4.4, hemoglobin 17.5, platelet count 240K.  Sodium 137, potassium 7.3, chloride 112, bicarb 18, anion gap 7, BUN 9, creatinine 0.9, glucose 111.  Calcium 11.4, albumin 5.0.  Lipase and LFTs normal.  Repeat metabolic panel was done to confirm hyperkalemia and revealed potassium 6.3 and bicarb 14.  EKG with peaked T waves.  CK within normal range.  UA with negative nitrite, large amount of leukocytes, 21-50 WBCs, and no bacteria.  SARS-CoV-2 PCR test pending.  Patient was given insulin/dextrose for hyperkalemia and 1 L normal saline bolus.  Subsequently became hypoglycemic with CBG 54 but blood glucose improved with administration of juice/food and additional IV dextrose.  Repeat metabolic panel showing potassium 4.9, bicarb 19, blood glucose 254.  CT abdomen pelvis was done given complaint of diarrhea but did not show any acute findings.  Patient reports history of diarrhea for the past 6 years for which she is seen by gastroenterology.  States the diarrhea is usually under control if she takes Lomotil but ran out of it a few days ago and since then having abdominal cramps and diarrhea again.  No fevers.  States she was previously on spironolactone but is no longer taking it.  At present she is only taking amlodipine for high blood pressure.  She does take a potassium 20 mEq daily and took an extra tablet just once a few days ago as she was having diarrhea,  otherwise she only takes 1 pill a day.  She is also having cramps in her feet.  Denies cough, shortness of breath, or chest pain.  She is vaccinated against COVID.  No other complaints.  Review of Systems:  All systems reviewed and apart from history of presenting illness, are negative.  Past Medical History:  Diagnosis Date   Allergy    Anemia    Iron deficiency   Anxiety    Arthritis    back, left shoulder   Blood transfusion without reported diagnosis    Chicken pox    Depression    Diarrhea    takes Imodium daily   GERD (gastroesophageal reflux disease)    H/O hiatal hernia    Headache(784.0)    History of kidney stones    1996ish   History of radiation therapy 11/16/16- 01/01/17   Base of Tongue/ 66 gy in 33 fractions/ Dose: 2 Gy   Hypertension    Iron deficiency anemia 12/11/2018   Migraine    none for 5 years (as of 01/13/16)   OSA (obstructive sleep apnea) 10/13/2015   unable to get cpap, plans to get one in 2018   Pneumonia    Restless legs    Scoliosis    Shingles 08/28/2013   Shortness of breath    with exertion   Sleep apnea    Tongue cancer ([place])    tongue cancer    Past Surgical History:  Procedure Laterality Date   BACK SURGERY  07/21/1978   CHOLECYSTECTOMY N/A 08/15/2013   Procedure: LAPAROSCOPIC CHOLECYSTECTOMY WITH INTRAOPERATIVE CHOLANGIOGRAM;  Surgeon: Gwenyth Ober, MD;  Location: [place];  Service: General;  Laterality: N/A;   COLONOSCOPY N/A 01/09/2013   Procedure: COLONOSCOPY;  Surgeon: Beryle Beams, MD;  Location: [place];  Service: Endoscopy;  Laterality: N/A;   COLONOSCOPY  03/2018   DIRECT LARYNGOSCOPY  07/2016   Dr. Nicolette Bang [facility]   ESOPHAGOGASTRODUODENOSCOPY  03/2018   ESOPHAGOGASTRODUODENOSCOPY (EGD) WITH PROPOFOL N/A 04/24/2019   Procedure: ESOPHAGOGASTRODUODENOSCOPY (EGD) WITH PROPOFOL;  Surgeon: Gatha Mayer, MD;  Location: [facility];  Service: Endoscopy;  Laterality: N/A;   GASTROSTOMY TUBE PLACEMENT  09/27/2016   GIVENS  CAPSULE STUDY N/A 04/24/2019   Procedure: GIVENS CAPSULE STUDY;  Surgeon: Gatha Mayer, MD;  Location: [facility];  Service: Endoscopy;  Laterality: N/A;   HERNIA REPAIR Left 1981   IR PATIENT EVAL TECH 0-60 MINS  12/13/2016   IR PATIENT EVAL TECH 0-60 MINS  04/11/2017   IR PATIENT EVAL TECH 0-60 MINS  03/24/2018   IR PATIENT EVAL TECH 0-60 MINS  11/23/2018   IR PATIENT EVAL TECH 0-60 MINS  05/28/2019   IR REPLACE G-TUBE SIMPLE WO FLUORO  12/12/2017   IR REPLACE G-TUBE SIMPLE WO FLUORO  03/29/2018   IR REPLACE G-TUBE SIMPLE WO FLUORO  06/29/2018   IR REPLACE G-TUBE SIMPLE WO FLUORO  11/02/2018   IR REPLACE G-TUBE SIMPLE WO FLUORO  03/08/2019   IR REPLACE G-TUBE SIMPLE WO FLUORO  07/02/2019   IR REPLACE G-TUBE SIMPLE WO FLUORO  11/30/2019   IR REPLACE G-TUBE SIMPLE WO FLUORO  12/27/2019   IR REPLACE G-TUBE SIMPLE WO FLUORO  05/27/2020   IR REPLACE G-TUBE SIMPLE WO FLUORO  07/09/2020   MODIFIED RADICAL NECK DISSECTION Left 09/27/2016   Levels 1 & 2   PARTIAL GLOSSECTOMY Left 09/27/2016   Left hemi partial glossectomy   SPINE SURGERY  01/20/2016   fusion   TONSILLECTOMY     tracheotomy  09/27/2016   TUBAL LIGATION  06/1988     reports that she has never smoked. She has never used smokeless tobacco. She reports current alcohol use of about 7.0 standard drinks of alcohol per week. She reports that she does not use drugs.  Allergies  Allergen Reactions   Elemental Sulfur Hives and Swelling   Other Hives and Swelling    tomato    Family History  Problem Relation Age of Onset   Heart disease Father    Heart attack Father    Hypertension Mother    Arthritis Mother    Diabetes Maternal Grandmother    Diabetes Paternal Grandmother    Diabetes Maternal Uncle    Colon polyps Neg Hx    Colon cancer Neg Hx    Esophageal cancer Neg Hx    Stomach cancer Neg Hx    Rectal cancer Neg Hx     Prior to Admission medications   Medication Sig Start Date End Date Taking? Authorizing Provider   amLODipine (NORVASC) 10 MG tablet TAKE 1 TABLET BY MOUTH EVERY DAY Patient taking differently: Place 10 mg into feeding tube daily. 06/06/20  Yes [name], MD  amphetamine-dextroamphetamine (ADDERALL) 10 MG tablet 1-3 daily as needed for sleepiness Patient taking differently: Place 10 mg into feeding tube daily. 09/10/19  Yes Young, Tarri Fuller D, MD  aspirin 81 MG chewable tablet Place 81 mg into feeding tube daily.  10/05/16  Yes [provider]  baclofen (LIORESAL) 10 MG tablet PLACE 1 TABLET VIA FEEDING TUBE THREE TIMES DAILY Patient taking differently: Place 10  mg into feeding tube 3 (three) times daily. 12/07/19  Yes Jessy Oto, MD  Calcium Citrate 200 MG TABS Place 200 mg into feeding tube daily at 12 noon.   Yes [provider]  clotrimazole (MYCELEX) 10 MG troche Take 10 mg by mouth daily as needed (infection). Per tube 05/06/20  Yes [provider]  DEXILANT 60 MG capsule Place 60 capsules into feeding tube daily. 08/22/19  Yes [provider]  diclofenac (VOLTAREN) 75 MG EC tablet TAKE 1 TABLET BY MOUTH TWICE DAILY WITH FOOD AS NEEDED FOR PAIN Patient taking differently: Take 75 mg by mouth 2 (two) times daily. Per tube 01/30/20  Yes Jessy Oto, MD  diphenoxylate-atropine (LOMOTIL) 2.5-0.025 MG tablet PLACE 2 TABLETS INTO FEEDING TUBE TWICE DAILY Patient taking differently: Place 2 tablets into feeding tube 2 (two) times daily. 06/11/20  Yes Gatha Mayer, MD  ferrous sulfate 220 (44 Fe) MG/5ML solution PLACE 6.8MLS(300MG) INTO FEEDING TUBE DAILY Patient taking differently: Place 220 mg into feeding tube daily. 11/16/19  Yes Gatha Mayer, MD  folic acid (FOLVITE) 349 MCG tablet Place 400 mcg into feeding tube daily.   Yes [provider]  magic mouthwash w/lidocaine SOLN Swish and or swallow 10 mL up to QID prn. Patient taking differently: Take 10 mLs by mouth daily as needed for mouth pain. Swish and or swallow 10 mL up to QID prn.  03/28/20  Yes Eppie Gibson, MD  meloxicam (MOBIC) 15 MG tablet TAKE 1 TABLET(15 MG) BY MOUTH DAILY Patient taking differently: Place 15 mg into feeding tube daily. 04/14/20  Yes Jessy Oto, MD  Multiple Vitamin (MULTIVITAMIN WITH MINERALS) TABS tablet Place 1 tablet into feeding tube daily. Patient taking differently: Place 1 tablet into feeding tube daily. Centrum 11/10/18  Yes Jessy Oto, MD  potassium chloride SA (KLOR-CON) 20 MEQ tablet TAKE 1 TABLET BY MOUTH TWICE DAILY FOR THE NEXT 3 DAYS. OKAY TO CRUSH IF UNABLE TO SWALLOW. CAN ALSO TAKE PER TUBE IF NEEDED. Patient taking differently: Take 20 mEq by mouth daily. Per tube 08/13/19  Yes [name], MD  pregabalin (LYRICA) 75 MG capsule TAKE 1 CAPSULE(75 MG) BY MOUTH TWICE DAILY Patient taking differently: Place 75 mg into feeding tube 2 (two) times daily. 06/06/20  Yes Jessy Oto, MD  traMADol (ULTRAM) 50 MG tablet TAKE 1 TABLET(50 MG) BY MOUTH EVERY 6 HOURS AS NEEDED Patient taking differently: Place 50 mg into feeding tube daily as needed for moderate pain. 06/06/20  Yes Jessy Oto, MD  diclofenac Sodium (VOLTAREN) 1 % GEL Apply 2 g topically 4 (four) times daily. Patient not taking: No sig reported 12/07/18   Jessy Oto, MD  Elastic Bandages & Supports (JOBST KNEE HIGH COMPRESSION SM) MISC 1 Units by Does not apply route daily. 08/24/19   Jessy Oto, MD  fluconazole (DIFLUCAN) 100 MG tablet Take 2 tablets today, then 1 tablet daily x 13 more days. Patient not taking: No sig reported 03/28/20   Eppie Gibson, MD  hydrocortisone cream 1 % APPLY EXTERNALLY TO THE AFFECTED AREA TWICE DAILY Patient not taking: No sig reported 06/16/18   [name], MD  SUMAtriptan (IMITREX) 100 MG tablet Take 0.5-1 tablet by mouth at the onset of migraine and may repeat in 2 hours if headache persists or recurs. Patient not taking: No sig reported 11/09/18   Jessy Oto, MD    Physical Exam: Vitals:   07/24/20 2000 07/24/20  2030 07/24/20 2045 07/25/20 0054  BP: (!) 120/106 (!) 125/100 (!) 127/113 (!) 114/99  Pulse: (!) 109 (!) 106 96 94  Resp: 19 17 19 20   Temp:    (!) 97.4 F (36.3 C)  TempSrc:    Axillary  SpO2: 99% 98% 96% 100%  Weight:      Height:        Physical Exam Constitutional:      General: She is not in acute distress. HENT:     Head: Normocephalic and atraumatic.     Mouth/Throat:     Mouth: Mucous membranes are dry.  Eyes:     Extraocular Movements: Extraocular movements intact.     Conjunctiva/sclera: Conjunctivae normal.  Cardiovascular:     Rate and Rhythm: Normal rate and regular rhythm.     Pulses: Normal pulses.  Pulmonary:     Effort: Pulmonary effort is normal. No respiratory distress.     Breath sounds: Normal breath sounds. No wheezing or rales.  Abdominal:     General: Bowel sounds are normal. There is no distension.     Palpations: Abdomen is soft.     Tenderness: There is no abdominal tenderness.  Musculoskeletal:        General: No swelling or tenderness.     Cervical back: Normal range of motion and neck supple.  Skin:    General: Skin is warm and dry.  Neurological:     General: No focal deficit present.     Mental Status: She is alert and oriented to person, place, and time.     Labs on Admission: I have personally reviewed following labs and imaging studies  CBC: Recent Labs  Lab 07/24/20 1724  WBC 4.4  HGB 17.5*  HCT 54.9*  MCV 90.9  PLT 937   Basic Metabolic Panel: Recent Labs  Lab 07/24/20 1724 07/24/20 2001 07/24/20 2306  NA 137 137 136  K 7.3* 6.3* 4.9  CL 112* 118* 112*  CO2 18* 14* 19*  GLUCOSE 111* 98 254*  257*  BUN 9 8 10   CREATININE 0.94 0.65 0.85  CALCIUM 11.4* 9.0 10.0   GFR: Estimated Creatinine Clearance: 69.1 mL/min (by C-G formula based on SCr of 0.85 mg/dL). Liver Function Tests: Recent Labs  Lab 07/24/20 1724  AST 19  ALT 16  ALKPHOS 94  BILITOT 1.1  PROT 8.9*  ALBUMIN 5.0   Recent Labs  Lab  07/24/20 1724  LIPASE 26   No results for input(s): AMMONIA in the last 168 hours. Coagulation Profile: No results for input(s): INR, PROTIME in the last 168 hours. Cardiac Enzymes: Recent Labs  Lab 07/24/20 2001  CKTOTAL 69   BNP (last 3 results) No results for input(s): PROBNP in the last 8760 hours. HbA1C: No results for input(s): HGBA1C in the last 72 hours. CBG: Recent Labs  Lab 07/24/20 2250 07/24/20 2320  GLUCAP 54* 181*   Lipid Profile: No results for input(s): CHOL, HDL, LDLCALC, TRIG, CHOLHDL, LDLDIRECT in the last 72 hours. Thyroid Function Tests: No results for input(s): TSH, T4TOTAL, FREET4, T3FREE, THYROIDAB in the last 72 hours. Anemia Panel: No results for input(s): VITAMINB12, FOLATE, FERRITIN, TIBC, IRON, RETICCTPCT in the last 72 hours. Urine analysis:    Component Value Date/Time   COLORURINE YELLOW 07/24/2020 2130   APPEARANCEUR CLEAR 07/24/2020 2130   LABSPEC 1.030 07/24/2020 2130   PHURINE 5.0 07/24/2020 2130   GLUCOSEU NEGATIVE 07/24/2020 2130   HGBUR SMALL (A) 07/24/2020 2130   BILIRUBINUR NEGATIVE 07/24/2020 2130  BILIRUBINUR neg 07/30/2019 [place] 07/24/2020 2130   PROTEINUR NEGATIVE 07/24/2020 2130   UROBILINOGEN 0.2 07/30/2019 1429   UROBILINOGEN 1.0 01/08/2013 0912   NITRITE NEGATIVE 07/24/2020 2130   LEUKOCYTESUR LARGE (A) 07/24/2020 2130    Radiological Exams on Admission: CT Abdomen Pelvis W Contrast  Result Date: 07/24/2020 CLINICAL DATA:  Nonlocalized acute abdominal pain. EXAM: CT ABDOMEN AND PELVIS WITH CONTRAST TECHNIQUE: Multidetector CT imaging of the abdomen and pelvis was performed using the standard protocol following bolus administration of intravenous contrast. CONTRAST:  64m OMNIPAQUE IOHEXOL 350 MG/ML SOLN COMPARISON:  CT abdomen pelvis 03/22/2019 FINDINGS: Lower chest: No acute abnormality. Hepatobiliary: The hepatic parenchyma is diffusely hypodense compared to the splenic parenchyma consistent  with fatty infiltration. No focal liver abnormality. Status post cholecystectomy. No biliary dilatation. Pancreas: Diffusely atrophic. No focal lesion. Otherwise normal pancreatic contour. No surrounding inflammatory changes. No main pancreatic ductal dilatation. Spleen: Normal in size without focal abnormality. Adrenals/Urinary Tract: No definite adrenal nodule bilaterally. Renal cortical scarring bilaterally. Bilateral kidneys enhance symmetrically. Fluid density lesion within the left kidney appears similar and likely represents a simple renal cyst. 5 mm calcified stone within the left kidney. No hydronephrosis. No hydroureter. The urinary bladder is unremarkable. On delayed imaging, there is no urothelial wall thickening and there are no filling defects in the opacified portions of the bilateral collecting systems or ureters. Stomach/Bowel: Gastrostomy tube with tip and balloon terminating within the gastric lumen. Stomach is within normal limits. No evidence of bowel wall thickening or dilatation. Fatty infiltration of the wall of the ascending colon suggestive of chronic inflammatory changes. Scattered colonic diverticulosis. Appendix appears normal. Vascular/Lymphatic: No abdominal aorta or iliac aneurysm. Mild atherosclerotic plaque of the aorta and its branches. No abdominal, pelvic, or inguinal lymphadenopathy. Reproductive: Uterus and bilateral adnexa are unremarkable. Other: No intraperitoneal free fluid. No intraperitoneal free gas. No organized fluid collection. Musculoskeletal: No abdominal wall hernia or abnormality. No suspicious lytic or blastic osseous lesions. No acute displaced fracture. Redemonstration of thoracolumbar dextroscoliosis with similar-appearing thoracolumbar surgical hardware. Associated severe multilevel degenerative changes of the spine. IMPRESSION: 1. Scattered colonic diverticulosis with no acute diverticulitis. 2. Nonobstructive 5 mm left nephrolithiasis. 3. Hepatic steatosis.  4. Gastrostomy tube in appropriate position. 5.  Aortic Atherosclerosis (ICD10-I70.0). Electronically Signed   By: MIven FinnM.D.   On: 07/24/2020 21:28    EKG: Independently reviewed.  Sinus tachycardia, peaked T waves.  Assessment/Plan Principal Problem:   Hyperkalemia Active Problems:   GERD (gastroesophageal reflux disease)   Hypersomnolence   Essential hypertension   Chronic diarrhea   Hyperkalemia with EKG changes Does take a potassium supplement at home.  Spironolactone was listed in home medications but patient states she is no longer on this medication.  Potassium significantly elevated on initial labs with EKG showing peaked T waves.  Patient was given insulin/dextrose in the ED and hyperkalemia has now resolved.  Repeat metabolic panel showing potassium 4.9. -Repeat EKG.  Hold home spironolactone and potassium supplement.  Chronic diarrhea Became worse after she ran out of Lomotil a few days ago.  Followed by GI.  Prior EGD and colonoscopy unrevealing.  Capsule endoscopy done in April 2021 showing no signs of IBD/Crohn's.  CT done today negative for acute finding.  COVID test pending but she is vaccinated and not endorsing any symptoms of an acute viral illness. -Resume Lomotil.  Outpatient GI follow-up.  Normal anion gap metabolic acidosis Likely due to chronic diarrhea.  Improved on repeat labs. -  IV fluid hydration, continue to monitor  Abnormal urinalysis UA with evidence of pyuria.  No fever or leukocytosis.  Patient is not endorsing any UTI symptoms. -Order urine culture  GERD -Continue PPI  Chronic pain -Continue Lyrica, baclofen  Hypertension Stable. -Continue amlodipine.  Hold spironolactone given hyperkalemia.  Hypersomnolence -Continue Adderall  DVT prophylaxis: Lovenox Code Status: Full code-discussed with the patient. Family Communication: No family available at this time. Disposition Plan: Status is: Observation  The patient remains OBS  appropriate and will d/c before 2 midnights.  Dispo: The patient is from: Home              Anticipated d/c is to: Home              Patient currently is not medically stable to d/c.   Difficult to place patient No  Level of care: Level of care: Telemetry  The medical decision making on this patient was of high complexity and the patient is at high risk for clinical deterioration, therefore this is a level 3 visit.  Shela Leff MD [facility]  If 7PM-7AM, please contact night-coverage [email or web address]  07/25/2020, 2:12 AM

## 2020-07-25 NOTE — Progress Notes (Signed)
New order received from On call  to address elevated potassium.

## 2020-07-26 DIAGNOSIS — K529 Noninfective gastroenteritis and colitis, unspecified: Secondary | ICD-10-CM | POA: Diagnosis not present

## 2020-07-26 DIAGNOSIS — I10 Essential (primary) hypertension: Secondary | ICD-10-CM | POA: Diagnosis not present

## 2020-07-26 DIAGNOSIS — E875 Hyperkalemia: Secondary | ICD-10-CM | POA: Diagnosis not present

## 2020-07-26 DIAGNOSIS — G4710 Hypersomnia, unspecified: Secondary | ICD-10-CM | POA: Diagnosis not present

## 2020-07-26 DIAGNOSIS — K219 Gastro-esophageal reflux disease without esophagitis: Secondary | ICD-10-CM | POA: Diagnosis not present

## 2020-07-26 LAB — RENAL FUNCTION PANEL
Albumin: 4 g/dL (ref 3.5–5.0)
Anion gap: 6 (ref 5–15)
BUN: 13 mg/dL (ref 6–20)
CO2: 23 mmol/L (ref 22–32)
Calcium: 9.8 mg/dL (ref 8.9–10.3)
Chloride: 107 mmol/L (ref 98–111)
Creatinine, Ser: 1.01 mg/dL — ABNORMAL HIGH (ref 0.44–1.00)
GFR, Estimated: >60 mL/min (ref >60)
Glucose, Bld: 102 mg/dL — ABNORMAL HIGH (ref 70–99)
Phosphorus: 6.6 mg/dL — ABNORMAL HIGH (ref 2.5–4.6)
Potassium: 4.6 mmol/L (ref 3.5–5.1)
Sodium: 136 mmol/L (ref 135–145)

## 2020-07-26 LAB — URINE CULTURE: Culture: <10000 — AB

## 2020-07-26 MED ORDER — DEXILANT 60 MG PO CPDR: 60.0000 mg | DELAYED_RELEASE_CAPSULE | Freq: Every day | ORAL | Status: DC

## 2020-07-26 MED ORDER — DIPHENOXYLATE-ATROPINE 2.5-0.025 MG PO TABS: 2.0000 | ORAL_TABLET | Freq: Two times a day (BID) | ORAL | 0 refills | Status: DC

## 2020-07-26 NOTE — Progress Notes (Signed)
Patient will be leaving with family later this morning. Belongings were returned. Education on medications will be provided.

## 2020-07-26 NOTE — Discharge Summary (Signed)
Physician Discharge Summary  Kristen [name] QVZ:[id number] DOB: 20-Oct-1965 DOA: 07/24/2020  PCP: [name], MD  Admit date: 07/24/2020 Discharge date: 07/26/2020  Admitted From: Home Disposition: Home   Recommendations for Outpatient Follow-up:  Follow up with PCP and GI in 1-2 weeks with repeat BMP  Home Health: None Equipment/Devices: None Discharge Condition: Stable CODE STATUS: Full Diet recommendation: As tolerated  Brief/Interim Summary: Kristen [name] is a 55 y.o. female with a history of lingual cancer s/p XRT 2018 and G tube, lumbar spinal stenosis, HTN, GERD, chronic diarrhea on potassium supplements, and hypersomnolence who presented to the ED with abdominal and leg cramps as well as worsening diarrhea after running out of lomotil. Labs appeared hemoconcentrated revealing polycythemia (hgb 17.5g/dl), with hyperkalemia (K 7.3), hypercalcemia (Ca 32.9), and metabolic acidosis (bicarb 18, then 14 on recheck). BUN 9, Cr 0.9. CT abdomen/pelvis showed no acute findings. Hyperkalemia was treated with insulin and dextrose and lokelma with durable resolution on 7/9. Diarrhea has significantly improved with reinitiation of antimotility agent which will also be prescribed at discharge.   Discharge Diagnoses:  Principal Problem:   Hyperkalemia Active Problems:   GERD (gastroesophageal reflux disease)   Hypersomnolence   Essential hypertension   Chronic diarrhea  Hyperkalemia: Resolved. Recheck at follow up.   Hypercalcemia: Asymptomatic, resolved.   Chronic diarrhea: EGD, colonoscopy, VCE were unrevealing without IBD and CT abd/pelvis here unremarkable. - Restarted lomotil, and prescribed at discharge. - Continue GI follow up.    HTN: - Continue norvasc. Holding spironolactone (which pt wasn't taking anyway)   Hypersomnolence: - Continue home adderall   Spinal stenosis, chronic back pain: - Continue baclofen, lyrica   Pyuria, asymptomatic.   Discharge  Instructions Discharge Instructions     Discharge instructions   Complete by: As directed    Continue home medications as you were with the following exceptions:  - Voltaren and mobic both belong to the same class of medication that should not be taken together. Please choose one or the other to take, and follow up with Dr. Louanne Skye and/or your PCP for further recommendations for pain control. - Do NOT take potassium until you follow up with your doctor next week with repeat blood work to check potassium and other electrolyte levels. Follow your doctor's directions after this to decide on when to restart potassium or make other medication changes. IF your symptoms return, seek medical attention right away. A new prescription for lomotil has been sent to your pharmacy.   Increase activity slowly   Complete by: As directed       Allergies as of 07/26/2020       Reactions   Elemental Sulfur Hives, Swelling   Other Hives, Swelling   tomato        Medication List     STOP taking these medications    diclofenac Sodium 1 % Gel Commonly known as: Voltaren   fluconazole 100 MG tablet Commonly known as: DIFLUCAN   hydrocortisone cream 1 %   SUMAtriptan 100 MG tablet Commonly known as: Imitrex       TAKE these medications    amLODipine 10 MG tablet Commonly known as: NORVASC TAKE 1 TABLET BY MOUTH EVERY DAY What changed: how to take this   amphetamine-dextroamphetamine 10 MG tablet Commonly known as: Adderall 1-3 daily as needed for sleepiness What changed:  how much to take how to take this when to take this additional instructions   aspirin 81 MG chewable tablet Place 81 mg into  feeding tube daily.   baclofen 10 MG tablet Commonly known as: LIORESAL PLACE 1 TABLET VIA FEEDING TUBE THREE TIMES DAILY What changed: See the new instructions.   Calcium Citrate 200 MG Tabs Place 200 mg into feeding tube daily at 12 noon.   clotrimazole 10 MG troche Commonly known as:  MYCELEX Take 10 mg by mouth daily as needed (infection). Per tube   Dexilant 60 MG capsule Generic drug: dexlansoprazole Place 1 capsule (60 mg total) into feeding tube daily. What changed: how much to take   diclofenac 75 MG EC tablet Commonly known as: VOLTAREN TAKE 1 TABLET BY MOUTH TWICE DAILY WITH FOOD AS NEEDED FOR PAIN What changed: See the new instructions.   diphenoxylate-atropine 2.5-0.025 MG tablet Commonly known as: LOMOTIL Place 2 tablets into feeding tube 2 (two) times daily.   ferrous sulfate 220 (44 Fe) MG/5ML solution PLACE 6.8MLS(300MG) INTO FEEDING TUBE DAILY What changed: See the new instructions.   folic acid 902 MCG tablet Commonly known as: FOLVITE Place 400 mcg into feeding tube daily.   JOBST KNEE HIGH COMPRESSION SM Misc 1 Units by Does not apply route daily.   magic mouthwash w/lidocaine Soln Swish and or swallow 10 mL up to QID prn. What changed:  how much to take how to take this when to take this reasons to take this   meloxicam 15 MG tablet Commonly known as: MOBIC TAKE 1 TABLET(15 MG) BY MOUTH DAILY What changed: See the new instructions.   multivitamin with minerals Tabs tablet Place 1 tablet into feeding tube daily. What changed: additional instructions   potassium chloride SA 20 MEQ tablet Commonly known as: KLOR-CON TAKE 1 TABLET BY MOUTH TWICE DAILY FOR THE NEXT 3 DAYS. OKAY TO CRUSH IF UNABLE TO SWALLOW. CAN ALSO TAKE PER TUBE IF NEEDED. What changed:  how much to take how to take this when to take this additional instructions   pregabalin 75 MG capsule Commonly known as: LYRICA TAKE 1 CAPSULE(75 MG) BY MOUTH TWICE DAILY What changed:  how much to take how to take this when to take this additional instructions   traMADol 50 MG tablet Commonly known as: ULTRAM TAKE 1 TABLET(50 MG) BY MOUTH EVERY 6 HOURS AS NEEDED What changed: See the new instructions.        Follow-up Information     [name], MD.  Schedule an appointment as soon as possible for a visit on 07/28/2020.   Specialty: Family Medicine Why: with repeat blood work Contact information: [place] [address] [phone]                Allergies  Allergen Reactions   Elemental Sulfur Hives and Swelling   Other Hives and Swelling    tomato    Consultations: None  Procedures/Studies: CT Abdomen Pelvis W Contrast  Result Date: 07/24/2020 CLINICAL DATA:  Nonlocalized acute abdominal pain. EXAM: CT ABDOMEN AND PELVIS WITH CONTRAST TECHNIQUE: Multidetector CT imaging of the abdomen and pelvis was performed using the standard protocol following bolus administration of intravenous contrast. CONTRAST:  69m OMNIPAQUE IOHEXOL 350 MG/ML SOLN COMPARISON:  CT abdomen pelvis 03/22/2019 FINDINGS: Lower chest: No acute abnormality. Hepatobiliary: The hepatic parenchyma is diffusely hypodense compared to the splenic parenchyma consistent with fatty infiltration. No focal liver abnormality. Status post cholecystectomy. No biliary dilatation. Pancreas: Diffusely atrophic. No focal lesion. Otherwise normal pancreatic contour. No surrounding inflammatory changes. No main pancreatic ductal dilatation. Spleen: Normal in size without focal abnormality. Adrenals/Urinary  Tract: No definite adrenal nodule bilaterally. Renal cortical scarring bilaterally. Bilateral kidneys enhance symmetrically. Fluid density lesion within the left kidney appears similar and likely represents a simple renal cyst. 5 mm calcified stone within the left kidney. No hydronephrosis. No hydroureter. The urinary bladder is unremarkable. On delayed imaging, there is no urothelial wall thickening and there are no filling defects in the opacified portions of the bilateral collecting systems or ureters. Stomach/Bowel: Gastrostomy tube with tip and balloon terminating within the gastric lumen. Stomach is within normal limits. No evidence of bowel wall  thickening or dilatation. Fatty infiltration of the wall of the ascending colon suggestive of chronic inflammatory changes. Scattered colonic diverticulosis. Appendix appears normal. Vascular/Lymphatic: No abdominal aorta or iliac aneurysm. Mild atherosclerotic plaque of the aorta and its branches. No abdominal, pelvic, or inguinal lymphadenopathy. Reproductive: Uterus and bilateral adnexa are unremarkable. Other: No intraperitoneal free fluid. No intraperitoneal free gas. No organized fluid collection. Musculoskeletal: No abdominal wall hernia or abnormality. No suspicious lytic or blastic osseous lesions. No acute displaced fracture. Redemonstration of thoracolumbar dextroscoliosis with similar-appearing thoracolumbar surgical hardware. Associated severe multilevel degenerative changes of the spine. IMPRESSION: 1. Scattered colonic diverticulosis with no acute diverticulitis. 2. Nonobstructive 5 mm left nephrolithiasis. 3. Hepatic steatosis. 4. Gastrostomy tube in appropriate position. 5.  Aortic Atherosclerosis (ICD10-I70.0). Electronically Signed   By: Iven Finn M.D.   On: 07/24/2020 21:28   IR REPLACE G-TUBE SIMPLE WO FLUORO  Result Date: 07/09/2020 INDICATION: History of head neck cancer with chronic feeding gastrostomy tube. Patient's gastrostomy tube was inadvertently removed last evening and presents today for replacement. EXAM: BEDSIDE REPLACEMENT OF GASTROSTOMY TUBE COMPARISON:  Bedside gastrostomy tube replacement-05/27/2020 MEDICATIONS: None. CONTRAST:  None FLUOROSCOPY TIME:  None COMPLICATIONS: None immediate. PROCEDURE: Patient was placed supine on a hospital gurney. As there was difficulty placing a 60 French gastrostomy tube via the chronic gastrostomy tube track, a 8 [place] dilator was inserted through the chronic track the level of the gastric lumen allowing placement of a short Amplatz wire. Next, over the Amplatz wire, the track was serially dilated ultimately allowing successful  placement of an 18 French balloon inflatable gastrostomy tube. Appropriate position was confirmed with the aspiration of air and easy flushing of saline. A dressing was applied. The patient tolerated the procedure well without immediate postprocedural complication. IMPRESSION: Successful bedside replacement of a new 18-French balloon retention gastrostomy tube. The new gastrostomy tube is ready for immediate use. Electronically Signed   By: Sandi Mariscal M.D.   On: 07/09/2020 11:01     Subjective: Feels much better. Diarrhea back to its baseline and she's eating and drinking well, mobilizing at her baseline. Ready to go home.   Discharge Exam: Vitals:   07/25/20 2008 07/26/20 0449  BP: 124/84 107/83  Pulse: 87 87  Resp: 18 18  Temp: 98.5 F (36.9 C) (!) 97.4 F (36.3 C)  SpO2: 98% 100%   General: Pt is alert, awake, not in acute distress Cardiovascular: RRR, S1/S2 +, no rubs, no gallops Respiratory: CTA bilaterally, no wheezing, no rhonchi Abdominal: Soft, NT, ND, bowel sounds + Extremities: No edema, no cyanosis  Labs: BNP (last 3 results) Recent Labs    07/30/19 1406  BNP 97   Basic Metabolic Panel: Recent Labs  Lab 07/24/20 2306 07/25/20 0517 07/25/20 1235 07/25/20 2032 07/26/20 0505  NA 136 137 134* 134* 136  K 4.9 6.3* 5.3* 4.3 4.6  CL 112* 109 109 108 107  CO2 19* 20* 18* 19* 23  GLUCOSE 254*  257* 111* 109* 101* 102*  BUN 10 11 11 13 13   CREATININE 0.85 0.90 0.86 0.98 1.01*  CALCIUM 10.0 11.1* 10.5* 9.8 9.8  PHOS  --   --  4.7* 6.3* 6.6*   Liver Function Tests: Recent Labs  Lab 07/24/20 1724 07/25/20 1235 07/25/20 2032 07/26/20 0505  AST 19  --   --   --   ALT 16  --   --   --   ALKPHOS 94  --   --   --   BILITOT 1.1  --   --   --   PROT 8.9*  --   --   --   ALBUMIN 5.0 4.5 3.9 4.0   Recent Labs  Lab 07/24/20 1724  LIPASE 26   No results for input(s): AMMONIA in the last 168 hours. CBC: Recent Labs  Lab 07/24/20 1724 07/25/20 0517  WBC  4.4 5.0  HGB 17.5* 16.8*  HCT 54.9* 52.8*  MCV 90.9 91.8  PLT 240 218   Cardiac Enzymes: Recent Labs  Lab 07/24/20 2001  CKTOTAL 69   BNP: Invalid input(s): POCBNP CBG: Recent Labs  Lab 07/24/20 2250 07/24/20 2320  GLUCAP 54* 181*   D-Dimer No results for input(s): DDIMER in the last 72 hours. Hgb A1c No results for input(s): HGBA1C in the last 72 hours. Lipid Profile No results for input(s): CHOL, HDL, LDLCALC, TRIG, CHOLHDL, LDLDIRECT in the last 72 hours. Thyroid function studies No results for input(s): TSH, T4TOTAL, T3FREE, THYROIDAB in the last 72 hours.  Invalid input(s): FREET3 Anemia work up No results for input(s): VITAMINB12, FOLATE, FERRITIN, TIBC, IRON, RETICCTPCT in the last 72 hours. Urinalysis    Component Value Date/Time   COLORURINE YELLOW 07/24/2020 2130   APPEARANCEUR CLEAR 07/24/2020 2130   LABSPEC 1.030 07/24/2020 2130   PHURINE 5.0 07/24/2020 2130   GLUCOSEU NEGATIVE 07/24/2020 2130   HGBUR SMALL (A) 07/24/2020 2130   BILIRUBINUR NEGATIVE 07/24/2020 2130   BILIRUBINUR neg 07/30/2019 1429   KETONESUR NEGATIVE 07/24/2020 2130   PROTEINUR NEGATIVE 07/24/2020 2130   UROBILINOGEN 0.2 07/30/2019 1429   UROBILINOGEN 1.0 01/08/2013 0912   NITRITE NEGATIVE 07/24/2020 2130   LEUKOCYTESUR LARGE (A) 07/24/2020 2130    Microbiology Recent Results (from the past 240 hour(s))  SARS CORONAVIRUS 2 (TAT 6-24 HRS) Nasopharyngeal Nasopharyngeal Swab     Status: None   Collection Time: 07/24/20  6:40 PM   Specimen: Nasopharyngeal Swab  Result Value Ref Range Status   SARS Coronavirus 2 NEGATIVE NEGATIVE Final    Comment: (NOTE) SARS-CoV-2 target nucleic acids are NOT DETECTED.  The SARS-CoV-2 RNA is generally detectable in upper and lower respiratory specimens during the acute phase of infection. Negative results do not preclude SARS-CoV-2 infection, do not rule out co-infections with other pathogens, and should not be used as the sole basis for  treatment or other patient management decisions. Negative results must be combined with clinical observations, patient history, and epidemiological information. The expected result is Negative.  Fact Sheet for Patients: SugarRoll.be  Fact Sheet for Healthcare Providers: https://www.woods-mathews.com/  This test is not yet approved or cleared by the [place] FDA and  has been authorized for detection and/or diagnosis of SARS-CoV-2 by FDA under an Emergency Use Authorization (EUA). This EUA will remain  in effect (meaning this test can be used) for the duration of the COVID-19 declaration under Se ction 564(b)(1) of the Act, 21 U.S.C. section 360bbb-3(b)(1), unless the authorization is terminated or revoked  sooner.  Performed at [facility], [place] [address] [place], [place] [address]     Time coordinating discharge: Approximately 40 minutes  Patrecia Pour, MD  [facility] 07/26/2020, 10:11 AM

## 2020-07-27 DIAGNOSIS — Z20822 Contact with and (suspected) exposure to covid-19: Secondary | ICD-10-CM | POA: Diagnosis not present

## 2020-07-29 ENCOUNTER — Ambulatory Visit: Payer: Self-pay | Admitting: Neurology

## 2020-07-30 ENCOUNTER — Ambulatory Visit (INDEPENDENT_AMBULATORY_CARE_PROVIDER_SITE_OTHER): Payer: Medicare Other | Admitting: Family Medicine

## 2020-07-30 ENCOUNTER — Other Ambulatory Visit: Payer: Self-pay

## 2020-07-30 ENCOUNTER — Encounter: Payer: Self-pay | Admitting: Family Medicine

## 2020-07-30 VITALS — BP 110/68 | HR 74 | Temp 98.3°F | Wt 157.2 lb

## 2020-07-30 DIAGNOSIS — M546 Pain in thoracic spine: Secondary | ICD-10-CM | POA: Diagnosis not present

## 2020-07-30 DIAGNOSIS — R3915 Urgency of urination: Secondary | ICD-10-CM | POA: Diagnosis not present

## 2020-07-30 DIAGNOSIS — R5383 Other fatigue: Secondary | ICD-10-CM | POA: Diagnosis not present

## 2020-07-30 DIAGNOSIS — N3000 Acute cystitis without hematuria: Secondary | ICD-10-CM | POA: Diagnosis not present

## 2020-07-30 DIAGNOSIS — K529 Noninfective gastroenteritis and colitis, unspecified: Secondary | ICD-10-CM

## 2020-07-30 DIAGNOSIS — I700 Atherosclerosis of aorta: Secondary | ICD-10-CM

## 2020-07-30 DIAGNOSIS — E875 Hyperkalemia: Secondary | ICD-10-CM

## 2020-07-30 DIAGNOSIS — G4710 Hypersomnia, unspecified: Secondary | ICD-10-CM

## 2020-07-30 DIAGNOSIS — I10 Essential (primary) hypertension: Secondary | ICD-10-CM | POA: Diagnosis not present

## 2020-07-30 LAB — CBC WITH DIFFERENTIAL/PLATELET
Basophils Absolute: 0 10*3/uL (ref 0.0–0.1)
Basophils Relative: 0.6 % (ref 0.0–3.0)
Eosinophils Absolute: 0 10*3/uL (ref 0.0–0.7)
Eosinophils Relative: 1.2 % (ref 0.0–5.0)
HCT: 38.7 % (ref 36.0–46.0)
Hemoglobin: 12.9 g/dL (ref 12.0–15.0)
Lymphocytes Relative: 32.9 % (ref 12.0–46.0)
Lymphs Abs: 1.1 10*3/uL (ref 0.7–4.0)
MCHC: 33.5 g/dL (ref 30.0–36.0)
MCV: 87.8 fl (ref 78.0–100.0)
Monocytes Absolute: 0.3 10*3/uL (ref 0.1–1.0)
Monocytes Relative: 8.3 % (ref 3.0–12.0)
Neutro Abs: 1.9 10*3/uL (ref 1.4–7.7)
Neutrophils Relative %: 57 % (ref 43.0–77.0)
Platelets: 188 10*3/uL (ref 150.0–400.0)
RBC: 4.4 Mil/uL (ref 3.87–5.11)
RDW: 13.9 % (ref 11.5–15.5)
WBC: 3.3 10*3/uL — ABNORMAL LOW (ref 4.0–10.5)

## 2020-07-30 LAB — POCT URINALYSIS DIPSTICK
Bilirubin, UA: NEGATIVE
Glucose, UA: NEGATIVE
Ketones, UA: NEGATIVE
Nitrite, UA: NEGATIVE
Protein, UA: POSITIVE — AB
Spec Grav, UA: <=1.005 — AB (ref 1.010–1.025)
Urobilinogen, UA: NEGATIVE E.U./dL — AB
pH, UA: 6 (ref 5.0–8.0)

## 2020-07-30 LAB — BASIC METABOLIC PANEL
BUN: 8 mg/dL (ref 6–23)
CO2: 27 mEq/L (ref 19–32)
Calcium: 9.5 mg/dL (ref 8.4–10.5)
Chloride: 104 mEq/L (ref 96–112)
Creatinine, Ser: 0.68 mg/dL (ref 0.40–1.20)
GFR: 98.23 mL/min (ref >60.00)
Glucose, Bld: 75 mg/dL (ref 70–99)
Potassium: 4.6 mEq/L (ref 3.5–5.1)
Sodium: 139 mEq/L (ref 135–145)

## 2020-07-30 LAB — IBC + FERRITIN
Ferritin: 101.9 ng/mL (ref 10.0–291.0)
Iron: 45 ug/dL (ref 42–145)
Saturation Ratios: 16 % — ABNORMAL LOW (ref 20.0–50.0)
Transferrin: 201 mg/dL — ABNORMAL LOW (ref 212.0–360.0)

## 2020-07-30 LAB — VITAMIN D 25 HYDROXY (VIT D DEFICIENCY, FRACTURES): VITD: 19.13 ng/mL — ABNORMAL LOW (ref 30.00–100.00)

## 2020-07-30 LAB — T4, FREE: Free T4: 0.77 ng/dL (ref 0.60–1.60)

## 2020-07-30 LAB — TSH: TSH: 1.17 u[IU]/mL (ref 0.35–5.50)

## 2020-07-30 LAB — VITAMIN B12: Vitamin B-12: 531 pg/mL (ref 211–911)

## 2020-07-30 MED ORDER — AMOXICILLIN-POT CLAVULANATE 500-125 MG PO TABS
1.0000 | ORAL_TABLET | Freq: Two times a day (BID) | ORAL | 0 refills | Status: DC
Start: 2020-07-30 — End: 2020-08-01

## 2020-07-30 NOTE — Progress Notes (Signed)
Subjective:    Patient ID: Kristen [name], female    DOB: 1965-10-09, 55 y.o.   MRN: [id number]  Chief Complaint  Patient presents with   [facility] f/u -hyperkalemia and diarrhea    HPI Patient is a 55 yo female with pmh sig for dysphagia due to h/o tongue cancer s/p g tube, lumbar spinal stenosis, HTN, hypersomnolence, GERD, chronic diarrhea who was seen today for HFU.  Admitted 7/7-07/26/20 for diarrhea, cramps with hyperkalemia and hypercalcemia.  LFTs and lipase normal.  Potassium initially 7.3 with repeat 6.3 and calcium 11.4.  EKG with peaked T waves.  UA with large leuks, nitrite negative.  Patient given insulin/dextrose for hyperkalemia and 1 L NS bolus.  Became hypoglycemic with FSBS 54 that improved with juice and IV dextrose.  No acute findings on CT abdomen pelvis.  Diarrhea improved with restarting Lomotil.  Hyperkalemia and hypercalcemia resolved.  Since d/c, pt notes decreased appetite, fatigue, improvement in diarrhea since restarting lomotil.  Endorses urinary freqency, urgency, abd bloating/tightness, and increased odor to urine.  Patient currently living with her mother.  Endorses midline thoracic back pain.  Also notes increased daytime sleepiness 2/2 hypersomnolence.  Has been out of Adderall times months as there was an issue with her prescription at the pharmacy.  Notes that it falls asleep sitting up paralysis of extremities and unable to move.  Episodes started several months ago.  Okay to follow-up with sleep laying down.  Seen by Ortho for spinal stenosis.  Inquires if her iron is low.  Taking Norvasc for BP.  Restarted 2 days worth of K dur but stopped 2/2 headache.  Past Medical History:  Diagnosis Date   Allergy    Anemia    Iron deficiency   Anxiety    Arthritis    back, left shoulder   Blood transfusion without reported diagnosis    Chicken pox    Depression    Diarrhea    takes Imodium daily   GERD (gastroesophageal reflux disease)    H/O  hiatal hernia    Headache(784.0)    History of kidney stones    1996ish   History of radiation therapy 11/16/16- 01/01/17   Base of Tongue/ 66 gy in 33 fractions/ Dose: 2 Gy   Hypertension    Iron deficiency anemia 12/11/2018   Migraine    none for 5 years (as of 01/13/16)   OSA (obstructive sleep apnea) 10/13/2015   unable to get cpap, plans to get one in 2018   Pneumonia    Restless legs    Scoliosis    Shingles 08/28/2013   Shortness of breath    with exertion   Sleep apnea    Tongue cancer (HCC)    tongue cancer    Allergies  Allergen Reactions   Elemental Sulfur Hives and Swelling   Other Hives and Swelling    tomato    ROS General: Denies fever, chills, night sweats, changes in weight +changes in appetite, fatigue HEENT: Denies headaches, ear pain, changes in vision, rhinorrhea, sore throat CV: Denies CP, palpitations, SOB, orthopnea Pulm: Denies SOB, cough, wheezing GI: Denies abdominal pain, nausea, vomiting, constipation  +diarrhea, abd pain,  GU: Denies dysuria, hematuria, frequency, vaginal discharge  +urinary frequency, urgency. Msk: Denies muscle cramps, joint pains Neuro: Denies weakness, numbness, tingling Skin: Denies rashes, bruising Psych: Denies depression, anxiety, hallucinations    Objective:    Blood pressure 110/68, pulse 74, temperature 98.3 F (36.8 C), temperature source Oral,  weight 157 lb 3.2 oz (71.3 kg), last menstrual period 11/15/2013, SpO2 98 %.  Gen. Pleasant,  fatigued, thinner, in no distress, normal affect   HEENT: [place]/AT, face symmetric, conjunctiva clear, no scleral icterus, PERRLA, EOMI, nares patent without drainage Lungs: no accessory muscle use, CTAB, no wheezes or rales Cardiovascular: RRR, no m/r/g, no peripheral edema Abdomen: BS present, soft, NT/ND. Musculoskeletal: No deformities, no cyanosis or clubbing, normal tone Neuro:  A&Ox3, CN II-XII intact, normal gait Skin:  Warm, no lesions/ rash  Wt Readings from Last 3  Encounters:  07/30/20 157 lb 3.2 oz (71.3 kg)  07/24/20 128 lb 14.4 oz (58.5 kg)  06/06/20 179 lb (81.2 kg)    Lab Results  Component Value Date   WBC 5.0 07/25/2020   HGB 16.8 (H) 07/25/2020   HCT 52.8 (H) 07/25/2020   PLT 218 07/25/2020   GLUCOSE 102 (H) 07/26/2020   ALT 16 07/24/2020   AST 19 07/24/2020   NA 136 07/26/2020   K 4.6 07/26/2020   CL 107 07/26/2020   CREATININE 1.01 (H) 07/26/2020   BUN 13 07/26/2020   CO2 23 07/26/2020   TSH 2.58 07/30/2019   INR 1.0 07/30/2019   HGBA1C 5.6 06/18/2015    Assessment/Plan:  Hyperkalemia  - Plan: Basic Metabolic Panel  Hypercalcemia  - Plan: Basic Metabolic Panel  Urinary urgency  -Asymptomatic pyuria during hospitalization. - Plan: POCT urinalysis dipstick  Essential hypertension -stable -continue current med norvasc. -increase po intake and hydration -for hypotension hold norvasc 10 mg  Acute cystitis without hematuria  -Rx for Augmentin d/c'd 2/2 Ucx with Enterobacter cloacae resistant to Augmentin.  Keflex started. - Plan: Culture, Urine, IBC + Ferritin, DISCONTINUED: amoxicillin-clavulanate (AUGMENTIN) 500-125 MG tablet  Fatigue, unspecified type  - Plan: Vitamin D, 25-hydroxy, Vitamin B12, TSH, T4, Free, CBC with Differential/Platelet, IBC + Ferritin  Acute midline thoracic back pain  -CT abd pelvis with nonobstructive 5 mm L nephrolithiasis - Plan: CBC with Differential/Platelet  Chronic diarrhea -improving -continue lamotil -f/u GI  Hypersomnolence -out of adderall 10 mg. -Advised to f/u with Pulmonology, Dr. Keturah Barre  Aortic Atherosclerosis [facility]) -lifestyle modifications -maximize medical therapies.  F/u in 2-4 wks prn  Grier Mitts, MD

## 2020-07-31 ENCOUNTER — Other Ambulatory Visit: Payer: Self-pay | Admitting: Specialist

## 2020-08-01 ENCOUNTER — Other Ambulatory Visit: Payer: Self-pay | Admitting: Family Medicine

## 2020-08-01 DIAGNOSIS — E876 Hypokalemia: Secondary | ICD-10-CM

## 2020-08-01 DIAGNOSIS — E559 Vitamin D deficiency, unspecified: Secondary | ICD-10-CM

## 2020-08-01 DIAGNOSIS — N3001 Acute cystitis with hematuria: Secondary | ICD-10-CM

## 2020-08-01 MED ORDER — CEPHALEXIN 500 MG PO CAPS: 500.0000 mg | ORAL_CAPSULE | Freq: Two times a day (BID) | ORAL | 0 refills | Status: AC

## 2020-08-01 MED ORDER — VITAMIN D (ERGOCALCIFEROL) 1.25 MG (50000 UNIT) PO CAPS: 50000.0000 [IU] | ORAL_CAPSULE | ORAL | 0 refills | Status: DC

## 2020-08-02 ENCOUNTER — Other Ambulatory Visit: Payer: Self-pay | Admitting: Family Medicine

## 2020-08-02 DIAGNOSIS — E876 Hypokalemia: Secondary | ICD-10-CM

## 2020-08-03 ENCOUNTER — Other Ambulatory Visit: Payer: Self-pay | Admitting: Family Medicine

## 2020-08-03 DIAGNOSIS — E876 Hypokalemia: Secondary | ICD-10-CM

## 2020-08-04 ENCOUNTER — Other Ambulatory Visit: Payer: Self-pay | Admitting: Family Medicine

## 2020-08-04 DIAGNOSIS — E876 Hypokalemia: Secondary | ICD-10-CM

## 2020-08-04 LAB — URINE CULTURE
MICRO NUMBER:: 12114454
SPECIMEN QUALITY:: ADEQUATE

## 2020-08-04 LAB — IRON,TIBC AND FERRITIN PANEL

## 2020-08-05 ENCOUNTER — Other Ambulatory Visit: Payer: Self-pay | Admitting: Family Medicine

## 2020-08-05 DIAGNOSIS — E876 Hypokalemia: Secondary | ICD-10-CM

## 2020-08-05 DIAGNOSIS — Z20822 Contact with and (suspected) exposure to covid-19: Secondary | ICD-10-CM | POA: Diagnosis not present

## 2020-08-06 ENCOUNTER — Other Ambulatory Visit: Payer: Self-pay | Admitting: Family Medicine

## 2020-08-06 DIAGNOSIS — E876 Hypokalemia: Secondary | ICD-10-CM

## 2020-08-12 DIAGNOSIS — Z20822 Contact with and (suspected) exposure to covid-19: Secondary | ICD-10-CM | POA: Diagnosis not present

## 2020-08-19 DIAGNOSIS — Z20822 Contact with and (suspected) exposure to covid-19: Secondary | ICD-10-CM | POA: Diagnosis not present

## 2020-08-20 ENCOUNTER — Encounter: Payer: Self-pay | Admitting: Family Medicine

## 2020-08-20 DIAGNOSIS — I700 Atherosclerosis of aorta: Secondary | ICD-10-CM | POA: Insufficient documentation

## 2020-08-25 DIAGNOSIS — Z20822 Contact with and (suspected) exposure to covid-19: Secondary | ICD-10-CM | POA: Diagnosis not present

## 2020-08-26 ENCOUNTER — Encounter: Payer: Self-pay | Admitting: Family Medicine

## 2020-08-28 ENCOUNTER — Encounter: Payer: Self-pay | Admitting: Surgery

## 2020-08-28 ENCOUNTER — Ambulatory Visit (INDEPENDENT_AMBULATORY_CARE_PROVIDER_SITE_OTHER): Payer: Medicare Other | Admitting: Surgery

## 2020-08-28 ENCOUNTER — Ambulatory Visit: Payer: Self-pay

## 2020-08-28 ENCOUNTER — Other Ambulatory Visit: Payer: Self-pay

## 2020-08-28 VITALS — BP 110/73 | HR 85

## 2020-08-28 DIAGNOSIS — M7041 Prepatellar bursitis, right knee: Secondary | ICD-10-CM | POA: Diagnosis not present

## 2020-08-28 DIAGNOSIS — M25561 Pain in right knee: Secondary | ICD-10-CM | POA: Diagnosis not present

## 2020-08-28 NOTE — Progress Notes (Signed)
Office Visit Note   Patient: Kristen [name]           Date of Birth: Oct 02, 1965           MRN: [id number] Visit Date: 08/28/2020              Requested by: [name], MD [place],  [place] [address] PCP: [name], MD   Assessment & Plan: Visit Diagnoses:  1. Right knee pain, unspecified chronicity     Plan:  I will have patient see Dr. Junius Roads tomorrow for right knee ultrasound to evaluate the proximal patella tendon and for possible aspiration of the bursa.  She can follow-up with me in a couple weeks for recheck.  Follow-Up Instructions: Orders:  Orders Placed This Encounter  Procedures   XR Knee 1-2 Views Right   No orders of the defined types were placed in this encounter.     Procedures: No procedures performed   Clinical Data: No additional findings.   Subjective: Chief Complaint  Patient presents with   Neck - Follow-up   Right Knee - Pain    HPI 55 year old female comes in today with complaints of right anterior knee pain and swelling.  Patient states that she has a "knot" on the front of her knee.  This started about 3 weeks ago.  No injury.  No mechanical symptoms or feeling of instability.  The area of swelling hurts at times when she is ambulating and also with direct pressure. Review of Systems No current cardiac pulmonary GI GU issues  Objective: Vital Signs: BP 110/73 (BP Location: Left Arm, Patient Position: Sitting)   Pulse 85   LMP 11/15/2013   SpO2 95%   Physical Exam HENT:     Head: Normocephalic.  Eyes:     Extraocular Movements: Extraocular movements intact.  Pulmonary:     Effort: No respiratory distress.  Musculoskeletal:     Comments: Exam right knee patient has good range of motion.  Does have some joint line tenderness.  She does have an area of swelling around the proximal patella tendon/inferior pole patella.  She does have moderate tenderness proximal patella tendon and also over the area  of swelling.  No signs of infection.  Extensor mechanism intact.  May be slight anterior knee soreness with resisted extension.  No significant joint effusion.  Neurological:     Mental Status: She is alert and oriented to person, place, and time.  Psychiatric:        Mood and Affect: Mood normal.    Ortho Exam  Specialty Comments:  No specialty comments available.  Imaging: No results found.   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   Malignant neoplasm of base of tongue ([place]) 10/29/2016   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[place]) 10/29/2016   Headache associated with sexual activity 05/14/2016   Tongue lesion 05/14/2016   Thrombocytopenia ([place]) 01/22/2016   Chronic diarrhea 01/22/2016   Chest pain    Essential hypertension  Spinal stenosis of lumbar region 01/20/2016    Class: Chronic   DDD (degenerative disc disease), lumbar 01/20/2016    Class: Chronic   Spinal stenosis of lumbar region with neurogenic claudication 01/20/2016   Low back pain 12/22/2015   Hypersomnolence 10/13/2015   Muscle cramp 08/01/2015   GERD (gastroesophageal reflux disease) 07/09/2015   Morbid obesity with BMI of 40.0-44.9, adult ([place]) 01/08/2013   Past Medical History:  Diagnosis Date   Allergy    Anemia    Iron deficiency   Anxiety    Arthritis    back, left shoulder   Blood transfusion without reported diagnosis    Chicken pox    Depression    Diarrhea    takes Imodium daily   GERD (gastroesophageal reflux  disease)    H/O hiatal hernia    Headache(784.0)    History of kidney stones    1996ish   History of radiation therapy 11/16/16- 01/01/17   Base of Tongue/ 36 gy in 33 fractions/ Dose: 2 Gy   Hypertension    Iron deficiency anemia 12/11/2018   Migraine    none for 5 years (as of 01/13/16)   OSA (obstructive sleep apnea) 10/13/2015   unable to get cpap, plans to get one in 2018   Pneumonia    Restless legs    Scoliosis    Shingles 08/28/2013   Shortness of breath    with exertion   Sleep apnea    Tongue cancer ([place])    tongue cancer    Family History  Problem Relation Age of Onset   Heart disease Father    Heart attack Father    Hypertension Mother    Arthritis Mother    Diabetes Maternal Grandmother    Diabetes Paternal Grandmother    Diabetes Maternal Uncle    Colon polyps Neg Hx    Colon cancer Neg Hx    Esophageal cancer Neg Hx    Stomach cancer Neg Hx    Rectal cancer Neg Hx     Past Surgical History:  Procedure Laterality Date   BACK SURGERY  07/21/1978   CHOLECYSTECTOMY N/A 08/15/2013   Procedure: LAPAROSCOPIC CHOLECYSTECTOMY WITH INTRAOPERATIVE CHOLANGIOGRAM;  Surgeon: Gwenyth Ober, MD;  Location: [facility];  Service: General;  Laterality: N/A;   COLONOSCOPY N/A 01/09/2013   Procedure: COLONOSCOPY;  Surgeon: Beryle Beams, MD;  [facility] ENDOSCOPY;  Service: Endoscopy;  Laterality: N/A;   COLONOSCOPY  03/2018   DIRECT LARYNGOSCOPY  07/2016   Dr. Nicolette Bang [facility]   ESOPHAGOGASTRODUODENOSCOPY  03/2018   ESOPHAGOGASTRODUODENOSCOPY (EGD) WITH PROPOFOL N/A 04/24/2019   Procedure: ESOPHAGOGASTRODUODENOSCOPY (EGD) WITH PROPOFOL;  Surgeon: Gatha Mayer, MD;  Location: [facility] ENDOSCOPY;  Service: Endoscopy;  Laterality: N/A;   GASTROSTOMY TUBE PLACEMENT  09/27/2016   GIVENS CAPSULE STUDY N/A 04/24/2019   Procedure: GIVENS CAPSULE STUDY;  Surgeon: Gatha Mayer, MD;  Location: [facility] ENDOSCOPY;  Service: Endoscopy;  Laterality: N/A;   HERNIA REPAIR Left 1981   IR PATIENT EVAL  TECH 0-60 MINS  12/13/2016   IR PATIENT EVAL TECH 0-60 MINS  04/11/2017   IR PATIENT EVAL TECH 0-60 MINS  03/24/2018   IR PATIENT EVAL TECH 0-60 MINS  11/23/2018   IR PATIENT EVAL TECH 0-60 MINS  05/28/2019   IR REPLACE G-TUBE SIMPLE WO FLUORO  12/12/2017   IR REPLACE G-TUBE SIMPLE WO FLUORO  03/29/2018   IR REPLACE G-TUBE SIMPLE WO FLUORO  06/29/2018   IR REPLACE G-TUBE SIMPLE WO FLUORO  11/02/2018   IR REPLACE G-TUBE SIMPLE WO FLUORO  03/08/2019   IR REPLACE G-TUBE SIMPLE WO FLUORO  07/02/2019   IR REPLACE G-TUBE SIMPLE WO FLUORO  11/30/2019   IR REPLACE G-TUBE SIMPLE WO FLUORO  12/27/2019   IR REPLACE G-TUBE SIMPLE WO FLUORO  05/27/2020   IR REPLACE G-TUBE SIMPLE WO FLUORO  07/09/2020   MODIFIED RADICAL NECK DISSECTION Left 09/27/2016   Levels 1 & 2   PARTIAL GLOSSECTOMY Left 09/27/2016   Left hemi partial glossectomy   SPINE SURGERY  01/20/2016   fusion   TONSILLECTOMY     tracheotomy  09/27/2016   TUBAL LIGATION  06/1988   Social History   Occupational History   Occupation: Air cabin crew  Tobacco Use   Smoking status: Never   Smokeless tobacco: Never  Vaping Use   Vaping Use: Never used  Substance and Sexual Activity   Alcohol use: Yes    Alcohol/week: 7.0 standard drinks    Types: 7 Cans of beer per week   Drug use: No   Sexual activity: Yes    Birth control/protection: Surgical

## 2020-08-29 ENCOUNTER — Ambulatory Visit (INDEPENDENT_AMBULATORY_CARE_PROVIDER_SITE_OTHER): Payer: Medicare Other | Admitting: Family Medicine

## 2020-08-29 ENCOUNTER — Ambulatory Visit: Payer: Self-pay

## 2020-08-29 ENCOUNTER — Encounter: Payer: Self-pay | Admitting: Family Medicine

## 2020-08-29 DIAGNOSIS — M25561 Pain in right knee: Secondary | ICD-10-CM

## 2020-08-29 DIAGNOSIS — M7041 Prepatellar bursitis, right knee: Secondary | ICD-10-CM | POA: Diagnosis not present

## 2020-08-29 NOTE — Progress Notes (Signed)
Subjective: She is here for ultrasound evaluation of a right knee nodule that has been present for 3 or 4 weeks, no injury.  Objective: She has what feels like a prepatellar bursa overlying the patella which is mildly tender, no warmth or erythema.  Ultrasound: There is normal appearance of the patella tendon with no signs of tendinopathy or tearing.  The prepatellar bursa is thickened but without hyperemia on power Doppler imaging.  Procedure: Right knee injection: After sterile prep with Betadine, injected 2 cc 1% lidocaine without epinephrine into the bursa, then attempted aspiration but no fluid was expressed, then injected 40 mg Depo-Medrol.  She will follow-up as needed.

## 2020-09-03 ENCOUNTER — Ambulatory Visit: Payer: Medicare Other | Admitting: Family Medicine

## 2020-09-16 ENCOUNTER — Other Ambulatory Visit: Payer: Self-pay

## 2020-09-16 DIAGNOSIS — C01 Malignant neoplasm of base of tongue: Secondary | ICD-10-CM

## 2020-09-24 ENCOUNTER — Other Ambulatory Visit: Payer: Self-pay | Admitting: Specialist

## 2020-09-26 DIAGNOSIS — Z20822 Contact with and (suspected) exposure to covid-19: Secondary | ICD-10-CM | POA: Diagnosis not present

## 2020-09-30 ENCOUNTER — Other Ambulatory Visit: Payer: Self-pay

## 2020-09-30 DIAGNOSIS — C01 Malignant neoplasm of base of tongue: Secondary | ICD-10-CM

## 2020-10-02 ENCOUNTER — Ambulatory Visit (INDEPENDENT_AMBULATORY_CARE_PROVIDER_SITE_OTHER): Payer: Medicare Other | Admitting: Specialist

## 2020-10-02 ENCOUNTER — Other Ambulatory Visit: Payer: Self-pay

## 2020-10-02 VITALS — BP 117/85 | HR 91 | Ht 69.0 in | Wt 157.0 lb

## 2020-10-02 DIAGNOSIS — M4807 Spinal stenosis, lumbosacral region: Secondary | ICD-10-CM | POA: Diagnosis not present

## 2020-10-02 DIAGNOSIS — M4325 Fusion of spine, thoracolumbar region: Secondary | ICD-10-CM | POA: Diagnosis not present

## 2020-10-02 DIAGNOSIS — T84498A Other mechanical complication of other internal orthopedic devices, implants and grafts, initial encounter: Secondary | ICD-10-CM | POA: Diagnosis not present

## 2020-10-02 DIAGNOSIS — Z4889 Encounter for other specified surgical aftercare: Secondary | ICD-10-CM

## 2020-10-02 DIAGNOSIS — M960 Pseudarthrosis after fusion or arthrodesis: Secondary | ICD-10-CM | POA: Diagnosis not present

## 2020-10-02 DIAGNOSIS — M4316 Spondylolisthesis, lumbar region: Secondary | ICD-10-CM | POA: Diagnosis not present

## 2020-10-02 NOTE — Patient Instructions (Signed)
Avoid bending, stooping and avoid lifting weights greater than 10 lbs. Avoid prolong standing and walking. Avoid frequent bending and stooping  No lifting greater than 10 lbs. May use ice or moist heat for pain. Weight loss is of benefit. Handicap license is approved. Lumbar myelogram and post myelogram CT scan assess lumbar interbody fusions and for ongoing lumbar  Spinal stenosis.

## 2020-10-02 NOTE — Progress Notes (Signed)
Office Visit Note   Patient: Kristen [name]           Date of Birth: 1965-12-12           MRN: [id number] Visit Date: 10/02/2020              Requested by: [name], MD [place],  [place] [address] PCP: [name], MD   Assessment & Plan: Visit Diagnoses:  1. Pseudarthrosis after fusion or arthrodesis   2. Fusion of spine of thoracolumbar region   3. Loosening of hardware in spine (HCC)   4. Spondylolisthesis of lumbar region     Plan: Avoid bending, stooping and avoid lifting weights greater than 10 lbs. Avoid prolong standing and walking. Avoid frequent bending and stooping  No lifting greater than 10 lbs. May use ice or moist heat for pain. Weight loss is of benefit. Handicap license is approved. Lumbar myelogram and post myelogram CT scan assess lumbar interbody fusions and for ongoing lumbar  Spinal stenosis.   Follow-Up Instructions: No follow-ups on file.   Orders:  No orders of the defined types were placed in this encounter.  No orders of the defined types were placed in this encounter.     Procedures: No procedures performed   Clinical Data: No additional findings.   Subjective: Chief Complaint  Patient presents with   Neck - Follow-up    Doing good, states that she is having issues getting to PT due to transportation problems   Right Knee - Follow-up    Doing much better     55 year old female with history of back and usually into the right leg. Had right knee aspiration and right knee feels better. Her legs still feel weak. The  walking and standing tires her out even after 10 minutes. Walking the circle at her home will cause her to fatigue.    Review of Systems   Objective: Vital Signs: BP 117/85 (BP Location: Left Arm, Patient Position: Sitting)   Pulse 91   Ht 5' 9"  (1.753 m)   Wt 157 lb (71.2 kg)   LMP 11/15/2013   BMI 23.18 kg/m   Physical Exam  Ortho Exam  Specialty Comments:  No  specialty comments available.  Imaging: No results found.   PMFS History: Patient Active Problem List   Diagnosis Date Noted   Spinal stenosis of lumbar region 01/20/2016    Priority: High    Class: Chronic   DDD (degenerative disc disease), lumbar 01/20/2016    Priority: High    Class: Chronic   Aortic atherosclerosis ([place]) 08/20/2020   Hyperkalemia 07/24/2020   Alcohol use 08/05/2019   Bilateral lower extremity edema 08/05/2019   Peripheral edema 03/12/2019   Iron deficiency anemia 12/11/2018   Loosening of hardware in spine [facility])    Other spondylosis with radiculopathy, lumbar region    History of lumbar spinal fusion 11/06/2018   UTI (urinary tract infection) 05/15/2017   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   Malignant neoplasm of base of tongue ([place]) 10/29/2016   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[place]) 10/29/2016   Headache associated with sexual activity 05/14/2016   Tongue lesion 05/14/2016   Thrombocytopenia ([place]) 01/22/2016   Chronic diarrhea 01/22/2016   Chest pain    Essential hypertension    Spinal stenosis of lumbar region with neurogenic claudication 01/20/2016   Low back pain 12/22/2015   Hypersomnolence 10/13/2015   Muscle cramp 08/01/2015  GERD (gastroesophageal reflux disease) 07/09/2015   Morbid obesity with BMI of 40.0-44.9, adult ([place]) 01/08/2013   Past Medical History:  Diagnosis Date   Allergy    Anemia    Iron deficiency   Anxiety    Arthritis    back, left shoulder   Blood transfusion without reported diagnosis    Chicken pox    Depression    Diarrhea    takes Imodium daily   GERD (gastroesophageal reflux disease)    H/O hiatal hernia    Headache(784.0)    History of kidney stones    1996ish   History of radiation therapy 11/16/16- 01/01/17   Base of Tongue/ 62 gy in 33 fractions/ Dose: 2 Gy   Hypertension    Iron deficiency anemia 12/11/2018   Migraine    none for 5 years (as of 01/13/16)   OSA  (obstructive sleep apnea) 10/13/2015   unable to get cpap, plans to get one in 2018   Pneumonia    Restless legs    Scoliosis    Shingles 08/28/2013   Shortness of breath    with exertion   Sleep apnea    Tongue cancer ([place])    tongue cancer    Family History  Problem Relation Age of Onset   Heart disease Father    Heart attack Father    Hypertension Mother    Arthritis Mother    Diabetes Maternal Grandmother    Diabetes Paternal Grandmother    Diabetes Maternal Uncle    Colon polyps Neg Hx    Colon cancer Neg Hx    Esophageal cancer Neg Hx    Stomach cancer Neg Hx    Rectal cancer Neg Hx     Past Surgical History:  Procedure Laterality Date   BACK SURGERY  07/21/1978   CHOLECYSTECTOMY N/A 08/15/2013   Procedure: LAPAROSCOPIC CHOLECYSTECTOMY WITH INTRAOPERATIVE CHOLANGIOGRAM;  Surgeon: Gwenyth Ober, MD;  Location: [facility];  Service: General;  Laterality: N/A;   COLONOSCOPY N/A 01/09/2013   Procedure: COLONOSCOPY;  Surgeon: Beryle Beams, MD;  Location: [facility] ENDOSCOPY;  Service: Endoscopy;  Laterality: N/A;   COLONOSCOPY  03/2018   DIRECT LARYNGOSCOPY  07/2016   Dr. Nicolette Bang [facility]   ESOPHAGOGASTRODUODENOSCOPY  03/2018   ESOPHAGOGASTRODUODENOSCOPY (EGD) WITH PROPOFOL N/A 04/24/2019   Procedure: ESOPHAGOGASTRODUODENOSCOPY (EGD) WITH PROPOFOL;  Surgeon: Gatha Mayer, MD;  Location[facility] ENDOSCOPY;  Service: Endoscopy;  Laterality: N/A;   GASTROSTOMY TUBE PLACEMENT  09/27/2016   GIVENS CAPSULE STUDY N/A 04/24/2019   Procedure: GIVENS CAPSULE STUDY;  Surgeon: Gatha Mayer, MD;  Location: [facility] ENDOSCOPY;  Service: Endoscopy;  Laterality: N/A;   HERNIA REPAIR Left 1981   IR PATIENT EVAL TECH 0-60 MINS  12/13/2016   IR PATIENT EVAL TECH 0-60 MINS  04/11/2017   IR PATIENT EVAL TECH 0-60 MINS  03/24/2018   IR PATIENT EVAL TECH 0-60 MINS  11/23/2018   IR PATIENT EVAL TECH 0-60 MINS  05/28/2019   IR REPLACE G-TUBE SIMPLE WO FLUORO  12/12/2017   IR REPLACE G-TUBE SIMPLE WO FLUORO  03/29/2018    IR REPLACE G-TUBE SIMPLE WO FLUORO  06/29/2018   IR REPLACE G-TUBE SIMPLE WO FLUORO  11/02/2018   IR REPLACE G-TUBE SIMPLE WO FLUORO  03/08/2019   IR REPLACE G-TUBE SIMPLE WO FLUORO  07/02/2019   IR REPLACE G-TUBE SIMPLE WO FLUORO  11/30/2019   IR REPLACE G-TUBE SIMPLE WO FLUORO  12/27/2019   IR REPLACE G-TUBE SIMPLE WO FLUORO  05/27/2020  IR REPLACE G-TUBE SIMPLE WO FLUORO  07/09/2020   MODIFIED RADICAL NECK DISSECTION Left 09/27/2016   Levels 1 & 2   PARTIAL GLOSSECTOMY Left 09/27/2016   Left hemi partial glossectomy   SPINE SURGERY  01/20/2016   fusion   TONSILLECTOMY     tracheotomy  09/27/2016   TUBAL LIGATION  06/1988   Social History   Occupational History   Occupation: Air cabin crew  Tobacco Use   Smoking status: Never   Smokeless tobacco: Never  Vaping Use   Vaping Use: Never used  Substance and Sexual Activity   Alcohol use: Yes    Alcohol/week: 7.0 standard drinks    Types: 7 Cans of beer per week   Drug use: No   Sexual activity: Yes    Birth control/protection: Surgical

## 2020-10-08 DIAGNOSIS — Z1152 Encounter for screening for COVID-19: Secondary | ICD-10-CM | POA: Diagnosis not present

## 2020-10-13 ENCOUNTER — Ambulatory Visit
Admission: RE | Admit: 2020-10-13 | Discharge: 2020-10-13 | Disposition: A | Discharge status: Home / Self Care | Payer: Medicare Other | Source: Ambulatory Visit | Attending: Specialist | Admitting: Specialist

## 2020-10-13 ENCOUNTER — Encounter: Payer: Self-pay | Admitting: Adult Health

## 2020-10-13 ENCOUNTER — Inpatient Hospital Stay: Payer: Medicare Other | Admitting: Adult Health

## 2020-10-13 ENCOUNTER — Other Ambulatory Visit: Payer: Self-pay | Admitting: *Deleted

## 2020-10-13 ENCOUNTER — Other Ambulatory Visit: Payer: Self-pay

## 2020-10-13 ENCOUNTER — Encounter: Payer: Medicare Other | Admitting: Adult Health

## 2020-10-13 DIAGNOSIS — M4807 Spinal stenosis, lumbosacral region: Secondary | ICD-10-CM

## 2020-10-13 DIAGNOSIS — M4325 Fusion of spine, thoracolumbar region: Secondary | ICD-10-CM

## 2020-10-13 DIAGNOSIS — Z4889 Encounter for other specified surgical aftercare: Secondary | ICD-10-CM

## 2020-10-13 DIAGNOSIS — M960 Pseudarthrosis after fusion or arthrodesis: Secondary | ICD-10-CM

## 2020-10-13 DIAGNOSIS — M545 Low back pain, unspecified: Secondary | ICD-10-CM | POA: Diagnosis not present

## 2020-10-13 DIAGNOSIS — M4326 Fusion of spine, lumbar region: Secondary | ICD-10-CM | POA: Diagnosis not present

## 2020-10-13 DIAGNOSIS — T84498A Other mechanical complication of other internal orthopedic devices, implants and grafts, initial encounter: Secondary | ICD-10-CM

## 2020-10-13 DIAGNOSIS — M4316 Spondylolisthesis, lumbar region: Secondary | ICD-10-CM

## 2020-10-13 MED ORDER — DIAZEPAM 5 MG PO TABS: 10.0000 mg | ORAL_TABLET | Freq: Once | ORAL | Status: DC

## 2020-10-13 MED ORDER — MEPERIDINE HCL 50 MG/ML IJ SOLN
50.0000 mg | Freq: Once | INTRAMUSCULAR | Status: AC | PRN
  Administered 2020-10-13: 50 mg via INTRAMUSCULAR

## 2020-10-13 MED ORDER — ONDANSETRON HCL 4 MG/2ML IJ SOLN
4.0000 mg | Freq: Once | INTRAMUSCULAR | Status: AC | PRN
  Administered 2020-10-13: 4 mg via INTRAMUSCULAR

## 2020-10-13 MED ORDER — IOPAMIDOL (ISOVUE-M 200) INJECTION 41%
20.0000 mL | Freq: Once | INTRAMUSCULAR | Status: AC
  Administered 2020-10-13: 15 mL via INTRATHECAL

## 2020-10-13 NOTE — Discharge Instructions (Signed)

## 2020-10-13 NOTE — Discharge Instr - Other Orders (Addendum)
1133: 6/10 pain noted post myelogram, see MAR.  1200: relief reported

## 2020-10-16 DIAGNOSIS — Z1152 Encounter for screening for COVID-19: Secondary | ICD-10-CM | POA: Diagnosis not present

## 2020-10-20 ENCOUNTER — Inpatient Hospital Stay: Payer: Medicare Other | Admitting: Adult Health

## 2020-10-20 ENCOUNTER — Telehealth: Payer: Self-pay | Admitting: Adult Health

## 2020-10-20 ENCOUNTER — Telehealth: Payer: Self-pay

## 2020-10-20 NOTE — Telephone Encounter (Signed)
Scheduled per sch msg. Called and left msg  

## 2020-10-20 NOTE — Progress Notes (Deleted)
CLINIC:  Survivorship  REASON FOR VISIT:  Routine follow-up for history of head & neck cancer.  BRIEF ONCOLOGIC HISTORY:   Kristen [name] is a 55 y.o. female who presented to ENT with headaches in April of 2018. MRI revealed left tongue / tongue base tumor.   Dr. Nicolette Bang performed surgery on 09/27/16. This included left hemiglossectomy, resection of base of tongue cancer, pharyngoplasty, left selective neck dissection, levels I and II, tracheotomy, and G tube placement. She has questionable margins that are technically negative.   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 14 lymph nodes from the left level II neck, 2 nodes from the left submandibular region, and 4 nodes from the submental region were negative for malignancy. Carcinoma was present focally at the lateral edge of the left posterior tongue, 4.6 cm in greatest extent. The lateral margin was sent separately as specimen B, which was negative for carcinoma. Perineural invasion was appreciated but lymphovascular invasion was not identified.   The patient underwent a CT of her chest with contrast yesterday which was negative for metastases. She had some other incidental findings. I personally reviewed these images.    According to our pathologist in [place], her disease is felt to be T4a pathologically, however at [facility] this was judged to be T3.    Stage IVA (pT4a, pN0, cM0)  BOT cancer   11/16/2016 - 01/01/2017: Tongue / 66 Gy in 33 fractions.   INTERVAL HISTORY:  ***  -Pain:  -Nutrition/Diet:  -Dysphagia?:  -Dental issues?: using fluoride trays?  -Last TSH:  -Weight: (LOSS/GAIN) since (last visit)   -Last ENT visit:   -Last Rad Onc visit:  -Last Dentist visit:     ADDITIONAL REVIEW OF SYSTEMS:  Review of Systems  Constitutional:  Negative for appetite change, chills, fatigue, fever and unexpected  weight change.  HENT:   Negative for hearing loss, lump/mass and trouble swallowing.   Eyes:  Negative for eye problems and icterus.  Respiratory:  Negative for chest tightness, cough and shortness of breath.   Cardiovascular:  Negative for chest pain, leg swelling and palpitations.  Gastrointestinal:  Negative for abdominal distention, abdominal pain, constipation, diarrhea, nausea and vomiting.  Endocrine: Negative for hot flashes.  Genitourinary:  Negative for difficulty urinating.   Musculoskeletal:  Negative for arthralgias.  Skin:  Negative for itching and rash.  Neurological:  Negative for dizziness, extremity weakness, headaches and numbness.  Hematological:  Negative for adenopathy. Does not bruise/bleed easily.  Psychiatric/Behavioral:  Negative for depression. The patient is not nervous/anxious.       CURRENT MEDICATIONS:  Current Outpatient Medications on File Prior to Visit  Medication Sig Dispense Refill   amLODipine (NORVASC) 10 MG tablet TAKE 1 TABLET BY MOUTH EVERY DAY 90 tablet 3   amphetamine-dextroamphetamine (ADDERALL) 10 MG tablet 1-3 daily as needed for sleepiness 90 tablet 0   aspirin 81 MG chewable tablet Place 81 mg into feeding tube daily.      baclofen (LIORESAL) 10 MG tablet PLACE 1 TABLET VIA FEEDING TUBE THREE TIMES DAILY 90 tablet 6   Calcium Citrate 200 MG TABS Place 200 mg into feeding tube daily at 12 noon.     clotrimazole (MYCELEX) 10 MG troche Take 10 mg by mouth daily as needed (infection). Per tube     DEXILANT 60 MG capsule Place 1 capsule (60 mg total) into feeding tube daily. 30 capsule  diclofenac (VOLTAREN) 75 MG EC tablet TAKE 1 TABLET BY MOUTH TWICE DAILY WITH FOOD AS NEEDED FOR PAIN 180 tablet 2   diphenoxylate-atropine (LOMOTIL) 2.5-0.025 MG tablet Place 2 tablets into feeding tube 2 (two) times daily. 120 tablet 0   Elastic Bandages & Supports (JOBST KNEE HIGH COMPRESSION SM) MISC 1 Units by Does not apply route daily. 3 each 1    ferrous sulfate 220 (44 Fe) MG/5ML solution PLACE 6.8MLS(300MG) INTO FEEDING TUBE DAILY 563 mL 11   folic acid (FOLVITE) 875 MCG tablet Place 400 mcg into feeding tube daily.     magic mouthwash w/lidocaine SOLN Swish and or swallow 10 mL up to QID prn. 250 mL 3   meloxicam (MOBIC) 15 MG tablet TAKE 1 TABLET(15 MG) BY MOUTH DAILY 30 tablet 2   Multiple Vitamin (MULTIVITAMIN WITH MINERALS) TABS tablet Place 1 tablet into feeding tube daily. 30 tablet 6   potassium chloride SA (KLOR-CON) 20 MEQ tablet TAKE 1 TABLET BY MOUTH TWICE DAILY FOR THE NEXT 3 DAYS. OKAY TO CRUSH IF UNABLE TO SWALLOW. CAN ALSO TAKE PER TUBE IF NEEDED. 6 tablet 0   pregabalin (LYRICA) 75 MG capsule TAKE 1 CAPSULE(75 MG) BY MOUTH TWICE DAILY 60 capsule 0   traMADol (ULTRAM) 50 MG tablet TAKE 1 TABLET(50 MG) BY MOUTH EVERY 6 HOURS AS NEEDED 30 tablet 0   Vitamin D, Ergocalciferol, (DRISDOL) 1.25 MG (50000 UNIT) CAPS capsule Take 1 capsule (50,000 Units total) by mouth every 7 (seven) days. 12 capsule 0   No current facility-administered medications on file prior to visit.    ALLERGIES:  Allergies  Allergen Reactions   Elemental Sulfur Hives and Swelling   Other Hives and Swelling    tomato   Sulfa Antibiotics      PHYSICAL EXAM:  There were no vitals filed for this visit. There were no vitals filed for this visit.  Weight Date                     Pre-treatment (RT consult date):    General: Well-nourished, well-appearing female/female*** in no acute distress.  Accompanied/Unaccompanied today.  HEENT: Head is atraumatic and normocephalic.  Pupils equal and reactive to light. Conjunctivae clear without exudate.  Sclerae anicteric. Oral mucosa is pink and moist without lesions.  Tongue pink, moist, and midline. Oropharynx is pink and moist, without lesions. Lymph: No preauricular, postauricular, cervical, supraclavicular, or infraclavicular lymphadenopathy noted on palpation.   Neck: No palpable masses. Skin on  neck is ***.  Cardiovascular: Normal rate and rhythm. Respiratory: Clear to auscultation bilaterally. Chest expansion symmetric without accessory muscle use; breathing non-labored.  GI: Abdomen soft and round. Non-tender, non-distended. Bowel sounds normoactive.  GU: Deferred.   Neuro: No focal deficits. Steady gait.   Psych: Normal mood and affect for situation. Extremities: No edema.  Skin: Warm and dry.    LABORATORY DATA:  None at this visit.***  DIAGNOSTIC IMAGING:  None at this visit.    ASSESSMENT & PLAN:  Ms. Hewins is a pleasant 55 y.o. female/female*** with history of ***, diagnosed in ***(date);  treated with ***; completed treatment on (date).  Patient presents to survivorship clinic today for routine follow-up after finishing treatment.   1. Cancer***:  Ms. Mun is clinically without evidence of disease or recurrence on physical exam today.     #. Nutritional status: Ms. Devaul reports that she is currently able to consume adequate nutrition by mouth/via tube***.  Weight is stable at *** lbs  today.  Encouraged to continue to consume adequate hydration and nutrition, as tolerated.    #. At risk for dysphagia: Given Ms. Krass's treatment for *** cancer, which included surgery and radiation therapy***, she is at risk for chronic dysphagia.  she reports having difficulty with breads and meats, but is able to consume soft foods and liquids without difficulty.  I encouraged her to continue to perform the swallowing exercises, as directed by Garald Balding, SLP.  If Ms. Lupien requires further swallowing or speech therapy evaluation, I will be happy to place that referral, if needed.  Currently, the patient's reported swallowing concerns are to be expected and stable.    #.  At risk for neck lymphedema:  When patients with head & neck cancers are treated with surgery and/or radiation therapy, there is an associated increased risk of neck lymphedema.  Ms. Birkeland reports that currently  she is experiencing what would be considered mild symptoms. she does/does not*** have a neck compression garment and reports wearing/not wearing*** it, as directed.  I encouraged Ms. Kiper to continue to wear the compression garment and practice the massage techniques to reduce the presence of lymphedema.  If her symptoms worsen, I would happy to place a formal referral to physical therapy for further evaluation and treatment.     #.  At risk for hypothyroidism: The thyroid gland is often affected after treatment for head & neck cancer.  Ms. Willow most recent TSH was normal at *** on (date).  If appropriate, she will be prescribed thyroid supplement with Levothyroxine. We discussed that she will continue to have serial TSH monitoring for at least the next 5 years as part of her routine follow-up and post-cancer treatment care.   #. At risk for tooth decay/dental concerns: After treatment with radiation for head & neck cancers, patients often experience xerostomia which increases their risk of dental caries. Ms. Guldin was encouraged to see his dentist 3-4 times per year. The patient should also continue to use her fluoride trays daily, as directed by dentistry.  I encouraged her to reach out to either Dr. Enrique Sack (oncology dentist) or her primary care dentist with additional questions or concerns.   #.  Lung cancer screening:  [place] now offers eligible patients lung cancer screening with a low-dose chest CT to aid in early detection, provide more effective treatment options, and ultimately improve survival benefits for patients diagnosed early.  Below is the selection criteria for screening:  Medicare patients: 55-77 years; privately insured patients 55-80 years. Active or former smokers who have quit within the last 15 years. 30+ pack-year history of smoking  Exclusion criteria - No signs/symptoms of lung cancer (i.e., no recent history of hemoptysis and no unexplained weight loss >15 pounds in  the last 6 months). Willing and healthy enough to undergo biopsies/surgery if needed.  Ms. Ruppert currently does/does not*** meet criteria for lung cancer screening.  Therefore, I have/have not*** placed a referral for screening today.    #. Tobacco & alcohol use: Ms. Pates reports that she quit smoking in *** and continues to abstain from all tobacco products.  I congratulated her continued efforts to remain tobacco free.  I also reinforced the importance of avoiding alcohol consumption as well.  Both tobacco and alcohol use in patients with head & neck cancer increases the risk of recurrence.  They also increase the risk of other cancers, as well.  Ms. Lottman states that she voiced understanding of the importance of continuing to  remain both tobacco and alcohol-free.    #. Health maintenance and wellness promotion: Cancer patients who consume a diet rich in fruits and vegetables have better overall health and decreased risk of cancer recurrence. Ms. Rubano was encouraged to consume 5-7 servings of fruits and vegetables per day, as tolerated. Ms. Southgate was also encouraged to engage in moderate to vigorous exercise for 30 minutes per day most days of the week.   #. Support services/counseling: It is not uncommon for this period of the patient's cancer care trajectory to be one of many emotions and stressors.  We discussed an opportunity for her to participate in the next session of Head & Neck FYNN ("Finding Your New Normal") support group series, designed for patients after they have completed treatment.   Ms. Furnari was encouraged to take advantage of our many other support services programs, support groups, and/or counseling in coping with her new life as a cancer survivor after completing anti-cancer treatment. The patient was offered support today through active listening and expressive supportive counseling.     Dispo:  -See Dr. Marland Kitchen (ENT) in *** -Return to cancer center to see Dr. Isidore Moos in  *** -Return to cancer center to see Survivorship NP in ***.    Total encounter time: *** minutes  Wilber Bihari, NP 10/20/20 2:02 AM Medical Oncology and Hematology [facility] [address], [place] [address] Tel. [phone]    Fax. [phone]  *Total Encounter Time as defined by the Centers for Medicare and Medicaid Services includes, in addition to the face-to-face time of a patient visit (documented in the note above) non-face-to-face time: obtaining and reviewing outside history, ordering and reviewing medications, tests or procedures, care coordination (communications with other health care professionals or caregivers) and documentation in the medical record.

## 2020-10-20 NOTE — Telephone Encounter (Signed)
Called pt regarding missed appt. Pt states she did not have transportation to come to appt this morning. Message sent to scheduling to r/s

## 2020-10-23 ENCOUNTER — Other Ambulatory Visit: Payer: Self-pay | Admitting: Family Medicine

## 2020-10-23 ENCOUNTER — Encounter: Payer: Self-pay | Admitting: Neurology

## 2020-10-23 ENCOUNTER — Ambulatory Visit: Payer: Self-pay | Admitting: Neurology

## 2020-10-23 DIAGNOSIS — Z1152 Encounter for screening for COVID-19: Secondary | ICD-10-CM | POA: Diagnosis not present

## 2020-10-28 ENCOUNTER — Encounter: Payer: Self-pay | Admitting: Adult Health

## 2020-10-28 ENCOUNTER — Inpatient Hospital Stay: Payer: Medicare Other | Attending: Adult Health | Admitting: Adult Health

## 2020-10-28 ENCOUNTER — Other Ambulatory Visit: Payer: Self-pay

## 2020-10-28 VITALS — BP 138/88 | HR 80 | Temp 98.1°F | Resp 18 | Ht 69.0 in | Wt 161.9 lb

## 2020-10-28 DIAGNOSIS — M549 Dorsalgia, unspecified: Secondary | ICD-10-CM | POA: Diagnosis not present

## 2020-10-28 DIAGNOSIS — Z79899 Other long term (current) drug therapy: Secondary | ICD-10-CM | POA: Diagnosis not present

## 2020-10-28 DIAGNOSIS — C01 Malignant neoplasm of base of tongue: Secondary | ICD-10-CM | POA: Diagnosis not present

## 2020-10-28 DIAGNOSIS — Z882 Allergy status to sulfonamides status: Secondary | ICD-10-CM | POA: Diagnosis not present

## 2020-10-28 DIAGNOSIS — C760 Malignant neoplasm of head, face and neck: Secondary | ICD-10-CM

## 2020-10-28 MED ORDER — CLOTRIMAZOLE 10 MG MT TROC: 10.0000 mg | Freq: Three times a day (TID) | OROMUCOSAL | 2 refills | Status: DC | PRN

## 2020-10-28 NOTE — Progress Notes (Signed)
CLINIC:  Survivorship  REASON FOR VISIT:  Routine follow-up for history of head & neck cancer.  BRIEF ONCOLOGIC HISTORY:   Dr. Nicolette Bang performed surgery on 09/27/16. This included left hemiglossectomy, resection of base of tongue cancer, pharyngoplasty, left selective neck dissection, levels I and II, tracheotomy, and G tube placement. She has questionable margins that are technically negative.   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 14 lymph nodes from the left level II neck, 2 nodes from the left submandibular region, and 4 nodes from the submental region were negative for malignancy. Carcinoma was present focally at the lateral edge of the left posterior tongue, 4.6 cm in greatest extent. The lateral margin was sent separately as specimen B, which was negative for carcinoma. Perineural invasion was appreciated but lymphovascular invasion was not identified.  Radiation treatment dates: 11/16/2016 - 01/01/2017   Site/dose:   Base of tongue tumor bed/ 66 Gy in 33 fractions   INTERVAL HISTORY:  Kristen [name] is doing well today.  She has no current issues.  She continues to have her G tube and is taking medications without difficulty.  She does not use it to eat.    -Pain: in back and right side, seeing Dr. Louanne Skye. -Nutrition/Diet: soft diet -Dysphagia?: yes, only eating soft foods -Dental issues?: using fluoride trays?  -Last TSH: 07/2020 (normal) -Weight: GAIN 4 pounds since 09/2020  -Next ENT visit:  11/04/2020 -Last Rad Onc visit: 03/2020, next, PRN -Last Dentist visit: endentulous    ADDITIONAL REVIEW OF SYSTEMS:  Review of Systems  Constitutional:  Negative for appetite change, chills, fatigue, fever and unexpected weight change.  HENT:   Negative for hearing loss, lump/mass and trouble swallowing.   Eyes:  Negative for eye problems and icterus.  Respiratory:  Negative  for chest tightness, cough and shortness of breath.   Cardiovascular:  Negative for chest pain, leg swelling and palpitations.  Gastrointestinal:  Negative for abdominal distention, abdominal pain, constipation, diarrhea, nausea and vomiting.  Endocrine: Negative for hot flashes.  Genitourinary:  Negative for difficulty urinating.   Musculoskeletal:  Negative for arthralgias.  Skin:  Negative for itching and rash.  Neurological:  Negative for dizziness, extremity weakness, headaches and numbness.  Hematological:  Negative for adenopathy. Does not bruise/bleed easily.  Psychiatric/Behavioral:  Negative for depression. The patient is not nervous/anxious.       CURRENT MEDICATIONS:  Current Outpatient Medications on File Prior to Visit  Medication Sig Dispense Refill   amLODipine (NORVASC) 10 MG tablet TAKE 1 TABLET BY MOUTH EVERY DAY 90 tablet 3   amphetamine-dextroamphetamine (ADDERALL) 10 MG tablet 1-3 daily as needed for sleepiness 90 tablet 0   aspirin 81 MG chewable tablet Place 81 mg into feeding tube daily.      baclofen (LIORESAL) 10 MG tablet PLACE 1 TABLET VIA FEEDING TUBE THREE TIMES DAILY 90 tablet 6   Calcium Citrate 200 MG TABS Place 200 mg into feeding tube daily at 12 noon.     clotrimazole (MYCELEX) 10 MG troche Take 10 mg by mouth daily as needed (infection). Per tube     DEXILANT 60 MG capsule Place 1 capsule (60 mg total) into feeding tube daily. 30 capsule    diclofenac (VOLTAREN) 75 MG EC tablet TAKE 1 TABLET BY MOUTH TWICE DAILY WITH FOOD AS NEEDED FOR PAIN 180 tablet 2   diphenoxylate-atropine (LOMOTIL) 2.5-0.025 MG tablet Place 2 tablets into feeding tube 2 (  two) times daily. 120 tablet 0   Elastic Bandages & Supports (JOBST KNEE HIGH COMPRESSION SM) MISC 1 Units by Does not apply route daily. 3 each 1   ferrous sulfate 220 (44 Fe) MG/5ML solution PLACE 6.8MLS(300MG) INTO FEEDING TUBE DAILY 737 mL 11   folic acid (FOLVITE) 106 MCG tablet Place 400 mcg into feeding  tube daily.     magic mouthwash w/lidocaine SOLN Swish and or swallow 10 mL up to QID prn. 250 mL 3   meloxicam (MOBIC) 15 MG tablet TAKE 1 TABLET(15 MG) BY MOUTH DAILY 30 tablet 2   Multiple Vitamin (MULTIVITAMIN WITH MINERALS) TABS tablet Place 1 tablet into feeding tube daily. 30 tablet 6   potassium chloride SA (KLOR-CON) 20 MEQ tablet TAKE 1 TABLET BY MOUTH TWICE DAILY FOR THE NEXT 3 DAYS. OKAY TO CRUSH IF UNABLE TO SWALLOW. CAN ALSO TAKE PER TUBE IF NEEDED. 6 tablet 0   pregabalin (LYRICA) 75 MG capsule TAKE 1 CAPSULE(75 MG) BY MOUTH TWICE DAILY 60 capsule 0   traMADol (ULTRAM) 50 MG tablet TAKE 1 TABLET(50 MG) BY MOUTH EVERY 6 HOURS AS NEEDED 30 tablet 0   Vitamin D, Ergocalciferol, (DRISDOL) 1.25 MG (50000 UNIT) CAPS capsule Take 1 capsule (50,000 Units total) by mouth every 7 (seven) days. 12 capsule 0   No current facility-administered medications on file prior to visit.    ALLERGIES:  Allergies  Allergen Reactions   Elemental Sulfur Hives and Swelling   Other Hives and Swelling    tomato   Sulfa Antibiotics      PHYSICAL EXAM:  Vitals:   10/28/20 1307  BP: 138/88  Pulse: 80  Resp: 18  Temp: 98.1 F (36.7 C)  SpO2: 96%   Filed Weights   10/28/20 1307  Weight: 161 lb 14.4 oz (73.4 kg)   General: Well-nourished, well-appearing female in no acute distress.  Accompanied/Unaccompanied today.  HEENT: Head is atraumatic and normocephalic.  Pupils equal and reactive to light. Conjunctivae clear without exudate.  Sclerae anicteric.Tongue pink, moist, and midline with thrush present Oropharynx is pink and moist,  Lymph: No preauricular, postauricular, cervical, supraclavicular, or infraclavicular lymphadenopathy noted on palpation.   Neck: No palpable masses. Skin on neck is intact and without scarring, no lymphadenopathy.  Cardiovascular: Normal rate and rhythm. Respiratory: Clear to auscultation bilaterally. Chest expansion symmetric without accessory muscle use; breathing  non-labored.  GI: Abdomen soft and round. Non-tender, non-distended. Bowel sounds normoactive.  GU: Deferred.   Neuro: No focal deficits. Steady gait.   Psych: Normal mood and affect for situation. Extremities: No edema.  Skin: Warm and dry.    LABORATORY DATA:  None at this visit.  DIAGNOSTIC IMAGING:  None at this visit.    ASSESSMENT & PLAN:  Ms. Collinsworth is a pleasant 55 y.o.female with base of tongue cancer s/p surgery and adjuvant radiation therapy. Patient presents to survivorship clinic today for routine follow-up after finishing treatment.   1. Base of tongue cancer:  Ms. Satterwhite is clinically without evidence of disease or recurrence on physical exam today.   She has thrush on exam today and I refilled her Mycelex troches.    2. Nutritional status: Ms. Thresher reports that she is currently able to consume adequate nutrition by mouth.  Weight is stable.  Encouraged to continue to consume adequate hydration and nutrition, as tolerated.    3. At risk for dysphagia: Given Ms. Kamp's treatment for base of tongue cancer, which included surgery and radiation therapy, she is at  risk for chronic dysphagia.  Currently, the patient's reported swallowing concerns are to be expected and stable.    4.  At risk for hypothyroidism: The thyroid gland is often affected after treatment for head & neck cancer.  Ms. Searles most recent TSH was normal in 07/2020.   I recommended that she continue annual TSH with her PCP.  5. Health maintenance and wellness promotion: Cancer patients who consume a diet rich in fruits and vegetables have better overall health and decreased risk of cancer recurrence. Ms. Rorrer was encouraged to consume 5-7 servings of fruits and vegetables per day, as tolerated. Ms. Lysne was also encouraged to engage in moderate to vigorous exercise for 30 minutes per day most days of the week.   6. Support services/counseling: It is not uncommon for this period of the patient's  cancer care trajectory to be one of many emotions and stressors.     Ms. Carra was encouraged to take advantage of our many other support services programs, support groups, and/or counseling in coping with her new life as a cancer survivor after completing anti-cancer treatment.   Dispo:  -See Dr. Nicolette Bang 10/2020 (ENT)  -Return to cancer center to see Dr. Isidore Moos PRN -Return to cancer center to see Survivorship NP in 1 year.   Total encounter time: 30 minutes in face to face visit time, chart review, lab review, order entry and documentation of encounter.    Wilber Bihari, NP 10/28/20 2:09 PM Medical Oncology and Hematology [facility] [address], [place] [address] Tel. [phone]    Fax. [phone]  *Total Encounter Time as defined by the Centers for Medicare and Medicaid Services includes, in addition to the face-to-face time of a patient visit (documented in the note above) non-face-to-face time: obtaining and reviewing outside history, ordering and reviewing medications, tests or procedures, care coordination (communications with other health care professionals or caregivers) and documentation in the medical record.

## 2020-10-31 DIAGNOSIS — Z1152 Encounter for screening for COVID-19: Secondary | ICD-10-CM | POA: Diagnosis not present

## 2020-11-02 ENCOUNTER — Other Ambulatory Visit: Payer: Self-pay

## 2020-11-02 ENCOUNTER — Emergency Department (HOSPITAL_COMMUNITY)
Admission: EM | Admit: 2020-11-02 | Discharge: 2020-11-03 | Disposition: A | Discharge status: Home / Self Care | Payer: Medicare Other | Attending: Emergency Medicine | Admitting: Emergency Medicine

## 2020-11-02 ENCOUNTER — Encounter (HOSPITAL_COMMUNITY): Payer: Self-pay

## 2020-11-02 ENCOUNTER — Emergency Department (HOSPITAL_COMMUNITY): Payer: Medicare Other

## 2020-11-02 DIAGNOSIS — T85528A Displacement of other gastrointestinal prosthetic devices, implants and grafts, initial encounter: Secondary | ICD-10-CM

## 2020-11-02 DIAGNOSIS — K9423 Gastrostomy malfunction: Secondary | ICD-10-CM | POA: Diagnosis not present

## 2020-11-02 DIAGNOSIS — Z85810 Personal history of malignant neoplasm of tongue: Secondary | ICD-10-CM | POA: Insufficient documentation

## 2020-11-02 DIAGNOSIS — Z79899 Other long term (current) drug therapy: Secondary | ICD-10-CM | POA: Diagnosis not present

## 2020-11-02 DIAGNOSIS — Z7982 Long term (current) use of aspirin: Secondary | ICD-10-CM | POA: Insufficient documentation

## 2020-11-02 DIAGNOSIS — I10 Essential (primary) hypertension: Secondary | ICD-10-CM | POA: Diagnosis not present

## 2020-11-02 DIAGNOSIS — Z4682 Encounter for fitting and adjustment of non-vascular catheter: Secondary | ICD-10-CM | POA: Diagnosis not present

## 2020-11-02 NOTE — ED Provider Notes (Signed)
Emergency Medicine Provider Triage Evaluation Note  Kristen [name] , a 55 y.o. female  was evaluated in triage.  Pt complains of her G-tube coming out about an hour ago.  Review of Systems  Positive: G-tube displaced, pain Negative:   Physical Exam  BP (!) 121/92 (BP Location: Left Arm)   Pulse 93   Temp 98.5 F (36.9 C) (Oral)   Resp 16   Ht 5' 9"  (1.753 m)   Wt 73 kg   LMP 11/15/2013   SpO2 92%   BMI 23.78 kg/m  Gen:   Awake, no distress   Resp:  Normal effort  MSK:   Moves extremities without difficulty  Other:  Patient with wrap around her waist holding G-tube in place.  The tube is clearly out.  Medical Decision Making  Medically screening exam initiated at 6:01 PM.  Appropriate orders placed.  Linzey L Kato was informed that the remainder of the evaluation will be completed by another provider, this initial triage assessment does not replace that evaluation, and the importance of remaining in the ED until their evaluation is complete.     Darliss Ridgel 11/02/20 1802    Lacretia Leigh, MD 11/02/20 2237

## 2020-11-02 NOTE — ED Triage Notes (Signed)
Pt states her G tube came out about an hour ago when it got caught on her pants.

## 2020-11-03 NOTE — ED Provider Notes (Signed)
[facility] DEPT Provider Note   CSN: [id number] Arrival date & time: 11/02/20  1735     History Chief Complaint  Patient presents with   G Tube Dislodged    Kristen [name] is a 55 y.o. female.  HPI Patient has a G-tube for medications.  She is able to eat orally.  Patient reports that she was going to the bathroom and accidentally caught her G-tube on her pants and ripped it out.  Her daughter was able to partially place it in the ostomy to hold it open.  This happened approximately an hour prior to arrival.  She reports its a little bit irritated and burning but no significant abdominal pain.    Past Medical History:  Diagnosis Date   Allergy    Anemia    Iron deficiency   Anxiety    Arthritis    back, left shoulder   Blood transfusion without reported diagnosis    Chicken pox    Depression    Diarrhea    takes Imodium daily   GERD (gastroesophageal reflux disease)    H/O hiatal hernia    Headache(784.0)    History of kidney stones    1996ish   History of radiation therapy 11/16/16- 01/01/17   Base of Tongue/ 66 gy in 33 fractions/ Dose: 2 Gy   Hypertension    Iron deficiency anemia 12/11/2018   Migraine    none for 5 years (as of 01/13/16)   OSA (obstructive sleep apnea) 10/13/2015   unable to get cpap, plans to get one in 2018   Pneumonia    Restless legs    Scoliosis    Shingles 08/28/2013   Shortness of breath    with exertion   Sleep apnea    Tongue cancer ([place])    tongue cancer    Patient Active Problem List   Diagnosis Date Noted   Aortic atherosclerosis ([place]) 08/20/2020   Hyperkalemia 07/24/2020   Alcohol use 08/05/2019   Bilateral lower extremity edema 08/05/2019   Peripheral edema 03/12/2019   Iron deficiency anemia 12/11/2018   Loosening of hardware in spine [facility])    Other spondylosis with radiculopathy, lumbar region    History of lumbar spinal fusion 11/06/2018   UTI (urinary tract infection) 05/15/2017    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   Malignant neoplasm of base of tongue ([place]) 10/29/2016   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   Headache associated with sexual activity 05/14/2016   Tongue lesion 05/14/2016   Thrombocytopenia ([place]) 01/22/2016   Chronic diarrhea 01/22/2016   Chest pain    Essential hypertension    Spinal stenosis of lumbar region 01/20/2016    Class: Chronic   DDD (degenerative disc disease), lumbar 01/20/2016    Class: Chronic   Spinal stenosis of lumbar region with neurogenic claudication 01/20/2016   Low back pain 12/22/2015   Hypersomnolence 10/13/2015   Muscle cramp 08/01/2015   GERD (gastroesophageal reflux disease) 07/09/2015   Morbid obesity with BMI of 40.0-44.9, adult ([place]) 01/08/2013    Past Surgical History:  Procedure Laterality Date   BACK SURGERY  07/21/1978   CHOLECYSTECTOMY N/A 08/15/2013   Procedure: LAPAROSCOPIC CHOLECYSTECTOMY WITH INTRAOPERATIVE CHOLANGIOGRAM;  Surgeon: Gwenyth Ober, MD;  Location: [facility];  Service: General;  Laterality: N/A;   COLONOSCOPY N/A 01/09/2013   Procedure: COLONOSCOPY;  Surgeon: Beryle Beams, MD;  Location: [facility];  Service: Endoscopy;  Laterality: N/A;   COLONOSCOPY  03/2018   DIRECT LARYNGOSCOPY  07/2016   Dr. Nicolette Bang [facility]   ESOPHAGOGASTRODUODENOSCOPY  03/2018   ESOPHAGOGASTRODUODENOSCOPY (EGD) WITH PROPOFOL N/A 04/24/2019   Procedure: ESOPHAGOGASTRODUODENOSCOPY (EGD) WITH PROPOFOL;  Surgeon: Gatha Mayer, MD;  [facility];  Service: Endoscopy;  Laterality: N/A;   GASTROSTOMY TUBE PLACEMENT  09/27/2016   GIVENS CAPSULE STUDY N/A 04/24/2019   Procedure: GIVENS CAPSULE STUDY;  Surgeon: Gatha Mayer, MD;  [facility];  Service: Endoscopy;  Laterality: N/A;   HERNIA REPAIR Left 1981   IR PATIENT EVAL TECH 0-60 MINS  12/13/2016   IR PATIENT EVAL TECH 0-60 MINS  04/11/2017   IR PATIENT EVAL TECH 0-60 MINS  03/24/2018   IR PATIENT EVAL TECH  0-60 MINS  11/23/2018   IR PATIENT EVAL TECH 0-60 MINS  05/28/2019   IR REPLACE G-TUBE SIMPLE WO FLUORO  12/12/2017   IR REPLACE G-TUBE SIMPLE WO FLUORO  03/29/2018   IR REPLACE G-TUBE SIMPLE WO FLUORO  06/29/2018   IR REPLACE G-TUBE SIMPLE WO FLUORO  11/02/2018   IR REPLACE G-TUBE SIMPLE WO FLUORO  03/08/2019   IR REPLACE G-TUBE SIMPLE WO FLUORO  07/02/2019   IR REPLACE G-TUBE SIMPLE WO FLUORO  11/30/2019   IR REPLACE G-TUBE SIMPLE WO FLUORO  12/27/2019   IR REPLACE G-TUBE SIMPLE WO FLUORO  05/27/2020   IR REPLACE G-TUBE SIMPLE WO FLUORO  07/09/2020   MODIFIED RADICAL NECK DISSECTION Left 09/27/2016   Levels 1 & 2   PARTIAL GLOSSECTOMY Left 09/27/2016   Left hemi partial glossectomy   SPINE SURGERY  01/20/2016   fusion   TONSILLECTOMY     tracheotomy  09/27/2016   TUBAL LIGATION  06/1988     OB History   No obstetric history on file.     Family History  Problem Relation Age of Onset   Heart disease Father    Heart attack Father    Hypertension Mother    Arthritis Mother    Diabetes Maternal Grandmother    Diabetes Paternal Grandmother    Diabetes Maternal Uncle    Colon polyps Neg Hx    Colon cancer Neg Hx    Esophageal cancer Neg Hx    Stomach cancer Neg Hx    Rectal cancer Neg Hx     Social History   Tobacco Use   Smoking status: Never   Smokeless tobacco: Never  Vaping Use   Vaping Use: Never used  Substance Use Topics   Alcohol use: Yes    Alcohol/week: 7.0 standard drinks    Types: 7 Cans of beer per week   Drug use: No    Home Medications Prior to Admission medications   Medication Sig Start Date End Date Taking? Authorizing Provider  amLODipine (NORVASC) 10 MG tablet TAKE 1 TABLET BY MOUTH EVERY DAY 06/06/20   [name], MD  aspirin 81 MG chewable tablet Place 81 mg into feeding tube daily.  10/05/16   [provider]  baclofen (LIORESAL) 10 MG tablet PLACE 1 TABLET VIA FEEDING TUBE THREE TIMES DAILY 12/07/19   Jessy Oto, MD  Calcium  Citrate 200 MG TABS Place 200 mg into feeding tube daily at 12 noon.    [provider]  clotrimazole (MYCELEX) 10 MG troche Take 1 tablet (10 mg total) by mouth 3 (three) times daily as needed (infection). 10/28/20   Causey, Charlestine Massed, NP  DEXILANT 60 MG capsule Place 1 capsule (60 mg total) into feeding tube daily. 07/26/20  Patrecia Pour, MD  diclofenac (VOLTAREN) 75 MG EC tablet TAKE 1 TABLET BY MOUTH TWICE DAILY WITH FOOD AS NEEDED FOR PAIN 01/30/20   Jessy Oto, MD  diphenoxylate-atropine (LOMOTIL) 2.5-0.025 MG tablet Place 2 tablets into feeding tube 2 (two) times daily. 07/26/20   Patrecia Pour, MD  ferrous sulfate 220 (44 Fe) MG/5ML solution PLACE 6.8MLS(300MG) INTO FEEDING TUBE DAILY 11/16/19   Gatha Mayer, MD  folic acid (FOLVITE) 035 MCG tablet Place 400 mcg into feeding tube daily.    [provider]  Multiple Vitamin (MULTIVITAMIN WITH MINERALS) TABS tablet Place 1 tablet into feeding tube daily. 11/10/18   Jessy Oto, MD  potassium chloride SA (KLOR-CON) 20 MEQ tablet TAKE 1 TABLET BY MOUTH TWICE DAILY FOR THE NEXT 3 DAYS. OKAY TO CRUSH IF UNABLE TO SWALLOW. CAN ALSO TAKE PER TUBE IF NEEDED. 08/13/19   [name], MD  pregabalin (LYRICA) 75 MG capsule TAKE 1 CAPSULE(75 MG) BY MOUTH TWICE DAILY 06/06/20   Jessy Oto, MD  traMADol (ULTRAM) 50 MG tablet TAKE 1 TABLET(50 MG) BY MOUTH EVERY 6 HOURS AS NEEDED 09/26/20   Jessy Oto, MD    Allergies    Elemental sulfur, Other, and Sulfa antibiotics  Review of Systems   Review of Systems Constitutional: No fever no chills no malaise Respiratory: No cough no shortness of breath GI: No vomiting no diarrhea Physical Exam Updated Vital Signs BP (!) 117/96 (BP Location: Left Arm)   Pulse 86   Temp 98.3 F (36.8 C) (Oral)   Resp 15   Ht 5' 9"  (1.753 m)   Wt 73 kg   LMP 11/15/2013   SpO2 99%   BMI 23.78 kg/m   Physical Exam Constitutional:      Comments: Alert nontoxic no acute distress   HENT:     Mouth/Throat:     Pharynx: Oropharynx is clear.  Eyes:     Extraocular Movements: Extraocular movements intact.  Pulmonary:     Effort: Pulmonary effort is normal.  Abdominal:     Comments: Abdomen soft nontender without guarding.  Skin:    General: Skin is warm and dry.  Neurological:     Mental Status: She is oriented to person, place, and time.  Psychiatric:        Mood and Affect: Mood normal.    ED Results / Procedures / Treatments   Labs (all labs ordered are listed, but only abnormal results are displayed) Labs Reviewed - No data to display  EKG None  Radiology DG Abdomen 1 View  Result Date: 11/02/2020 CLINICAL DATA:  Check gastrostomy placement EXAM: ABDOMEN - 1 VIEW COMPARISON:  07/02/2019 FINDINGS: Gastrostomy catheter was injected with contrast material and shows the tip to be within the gastric lumen. Prior lumbar surgery is noted with stable scoliosis. IMPRESSION: Gastric catheter within the stomach. Electronically Signed   By: Inez Catalina M.D.   On: 11/02/2020 22:43    Procedures Gastrostomy tube replacement  Date/Time: 11/03/2020 12:06 AM Performed by: Charlesetta Shanks, MD Authorized by: Charlesetta Shanks, MD  Consent: Verbal consent obtained. Risks and benefits: risks, benefits and alternatives were discussed Consent given by: patient Patient understanding: patient states understanding of the procedure being performed Patient identity confirmed: verbally with patient Local anesthesia used: no  Anesthesia: Local anesthesia used: no  Sedation: Patient sedated: no  Patient tolerance: patient tolerated the procedure well with no immediate complications Comments: Ostomy on anterior abdomen cleaned with wound cleanser.  86 French G-tube placed without difficulty.  Balloon inflated.  Seated well.  Placement confirmed by KUB with Gastrografin.     Medications Ordered in ED Medications - No data to display  ED Course  I have reviewed the  triage vital signs and the nursing notes.  Pertinent labs & imaging results that were available during my care of the patient were reviewed by me and considered in my medical decision making (see chart for details).    MDM Rules/Calculators/A&P                          Patient is G-tube mechanically dislodged.  She is otherwise without acute complaints.  Replaced at bedside without difficulty.  Confirmed by radiology.  Patient discharged to resume as per usual home use. Final Clinical Impression(s) / ED Diagnoses Final diagnoses:  Dislodged gastrostomy tube    Rx / DC Orders ED Discharge Orders     None        Charlesetta Shanks, MD 11/03/20 0007

## 2020-11-03 NOTE — Discharge Instructions (Signed)
1.  Resume use your G-tube per usual.

## 2020-11-04 DIAGNOSIS — C109 Malignant neoplasm of oropharynx, unspecified: Secondary | ICD-10-CM | POA: Diagnosis not present

## 2020-11-04 DIAGNOSIS — Z85810 Personal history of malignant neoplasm of tongue: Secondary | ICD-10-CM | POA: Diagnosis not present

## 2020-11-04 DIAGNOSIS — R1312 Dysphagia, oropharyngeal phase: Secondary | ICD-10-CM | POA: Diagnosis not present

## 2020-11-07 ENCOUNTER — Other Ambulatory Visit: Payer: Self-pay | Admitting: Internal Medicine

## 2020-11-07 ENCOUNTER — Other Ambulatory Visit: Payer: Self-pay

## 2020-11-17 ENCOUNTER — Ambulatory Visit: Payer: Self-pay

## 2020-11-17 ENCOUNTER — Ambulatory Visit (INDEPENDENT_AMBULATORY_CARE_PROVIDER_SITE_OTHER): Payer: Medicare Other | Admitting: Specialist

## 2020-11-17 ENCOUNTER — Encounter: Payer: Self-pay | Admitting: Specialist

## 2020-11-17 ENCOUNTER — Other Ambulatory Visit: Payer: Self-pay

## 2020-11-17 VITALS — BP 126/86 | Ht 69.0 in | Wt 161.0 lb

## 2020-11-17 DIAGNOSIS — M79645 Pain in left finger(s): Secondary | ICD-10-CM | POA: Diagnosis not present

## 2020-11-17 DIAGNOSIS — M1812 Unilateral primary osteoarthritis of first carpometacarpal joint, left hand: Secondary | ICD-10-CM | POA: Diagnosis not present

## 2020-11-17 DIAGNOSIS — M25532 Pain in left wrist: Secondary | ICD-10-CM

## 2020-11-17 DIAGNOSIS — M654 Radial styloid tenosynovitis [de Quervain]: Secondary | ICD-10-CM

## 2020-11-17 MED ORDER — NAPROXEN 500 MG PO TABS: 500.0000 mg | ORAL_TABLET | Freq: Two times a day (BID) | ORAL | 0 refills | Status: DC

## 2020-11-17 NOTE — Patient Instructions (Signed)
Left hand use splint and ice locally for 15 min on and 30 min off. Avoid gripping and using left hand Take naprosyn 500 mg twice a day with meal or snack. Appointment to see Dr. Tempie Donning, hand specialist for severe osteoarthritis left thumb MCC joint and de Quervain's tenosynovitis.  Can use local transdermal voltaren gel 3-4 times per day.  Avoid frequent bending and stooping  No lifting greater than 10 lbs. May use ice or moist heat for pain. Weight loss is of benefit. Best medication for lumbar disc disease is arthritis medications like motrin, celebrex and naprosyn. Exercise is important to improve your indurance and does allow people to function better inspite of back pain.

## 2020-11-17 NOTE — Progress Notes (Signed)
Office Visit Note   Patient: Kristen [name]           Date of Birth: 1965/09/28           MRN: [id number] Visit Date: 11/17/2020              Requested by: [name], MD [place],  [place] [id number] PCP: [name], MD   Assessment & Plan: Visit Diagnoses:  1. Pain in left wrist   2. Thumb pain, left   3. Tenosynovitis, de Quervain   4. Arthritis of carpometacarpal (CMC) joint of left thumb     Plan: Left hand use splint and ice locally for 15 min on and 30 min off. Avoid gripping and using left hand Take naprosyn 500 mg twice a day with meal or snack. Appointment to see Dr. Tempie Donning, hand specialist for severe osteoarthritis left thumb MCC joint and de Quervain's tenosynovitis.  Can use local transdermal voltaren gel 3-4 times per day.  Avoid frequent bending and stooping  No lifting greater than 10 lbs. May use ice or moist heat for pain. Weight loss is of benefit. Best medication for lumbar disc disease is arthritis medications like motrin, celebrex and naprosyn. Exercise is important to improve your indurance and does allow people to function better inspite of back pain.  Follow-Up Instructions: Return in about 4 weeks (around 12/15/2020).   Orders:  Orders Placed This Encounter  Procedures  . XR Hand Complete Left  . XR Wrist 2 Views Left   No orders of the defined types were placed in this encounter.     Procedures: No procedures performed   Clinical Data: No additional findings.   Subjective: Chief Complaint  Patient presents with  . Lower Back - Pain    55 year old female right handed with severe left base of thumb swelling and pain difficulty gripping. Daughter lendt her a brace and pain is getting worse. History of tongue ca  and has difficulty with speech and swallowing. She also is having pain over the dorsoradial left wrist with tenderness and swelling here also.   Review of Systems  Constitutional:  Negative.   HENT: Negative.    Eyes: Negative.   Respiratory: Negative.    Cardiovascular: Negative.   Gastrointestinal: Negative.   Endocrine: Negative.   Genitourinary: Negative.   Musculoskeletal: Negative.   Skin: Negative.   Allergic/Immunologic: Negative.   Neurological: Negative.   Hematological: Negative.   Psychiatric/Behavioral: Negative.      Objective: Vital Signs: BP 126/86   Ht 5' 9"  (1.753 m)   Wt 161 lb (73 kg)   LMP 11/15/2013   BMI 23.78 kg/m   Physical Exam Constitutional:      Appearance: She is well-developed.  HENT:     Head: Normocephalic and atraumatic.  Eyes:     Pupils: Pupils are equal, round, and reactive to light.  Pulmonary:     Effort: Pulmonary effort is normal.     Breath sounds: Normal breath sounds.  Abdominal:     General: Bowel sounds are normal.     Palpations: Abdomen is soft.  Musculoskeletal:        General: Normal range of motion.     Cervical back: Normal range of motion and neck supple.  Skin:    General: Skin is warm and dry.  Neurological:     Mental Status: She is alert and oriented to person, place, and time.  Psychiatric:  Behavior: Behavior normal.        Thought Content: Thought content normal.        Judgment: Judgment normal.   Left Hand Exam   Tests  Finkelstein's test: positive  Other  Erythema: absent Scars: absent Sensation: none Pulse: present  Comments:  Positive grind test left thumb with clinical swelling of the left CMC joint with radial subluxation  of the joint. Tender over the 1st dorsal compartment at the radial styloid.     Specialty Comments:  No specialty comments available.  Imaging: No results found.   PMFS History: Patient Active Problem List   Diagnosis Date Noted  . Spinal stenosis of lumbar region 01/20/2016    Priority: High    Class: Chronic  . DDD (degenerative disc disease), lumbar 01/20/2016    Priority: High    Class: Chronic  . Aortic atherosclerosis  ([place]) 08/20/2020  . Hyperkalemia 07/24/2020  . Alcohol use 08/05/2019  . Bilateral lower extremity edema 08/05/2019  . Peripheral edema 03/12/2019  . Iron deficiency anemia 12/11/2018  . Loosening of hardware in spine ([place])   . Other spondylosis with radiculopathy, lumbar region   . History of lumbar spinal fusion 11/06/2018  . UTI (urinary tract infection) 05/15/2017  . 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 Malignant neoplasm of base of tongue ([place]) 10/29/2016  . 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 Headache associated with sexual activity 05/14/2016  . Tongue lesion 05/14/2016  . Thrombocytopenia ([place]) 01/22/2016  . Chronic diarrhea 01/22/2016  . Chest pain   . Essential hypertension   . Spinal stenosis of lumbar region with neurogenic claudication 01/20/2016  . Low back pain 12/22/2015  . Hypersomnolence 10/13/2015  . Muscle cramp 08/01/2015  . GERD (gastroesophageal reflux disease) 07/09/2015  . Morbid obesity with BMI of 40.0-44.9, adult ([place]) 01/08/2013   Past Medical History:  Diagnosis Date  . Allergy   . Anemia    Iron deficiency  . Anxiety   . Arthritis    back, left shoulder  . Blood transfusion without reported diagnosis   . Chicken pox   . Depression   . Diarrhea    takes Imodium daily  . GERD (gastroesophageal reflux disease)   . H/O hiatal hernia   . Headache(784.0)   . History of kidney stones    1996ish  . History of radiation therapy 11/16/16- 01/01/17   Base of Tongue/ 66 gy in 33 fractions/ Dose: 2 Gy  . Hypertension   . Iron deficiency anemia 12/11/2018  . Migraine    none for 5 years (as of 01/13/16)  . OSA (obstructive sleep apnea) 10/13/2015   unable to get cpap, plans to get one in 2018  . Pneumonia   . Restless legs   . Scoliosis   . Shingles 08/28/2013  . Shortness of breath    with exertion  . Sleep apnea   . Tongue cancer ([place])    tongue cancer    Family History  Problem Relation  Age of Onset  . Heart disease Father   . Heart attack Father   . Hypertension Mother   . Arthritis Mother   . Diabetes Maternal Grandmother   . Diabetes Paternal Grandmother   . Diabetes Maternal Uncle   . Colon polyps Neg Hx   . Colon cancer Neg Hx   . Esophageal cancer Neg Hx   . Stomach cancer Neg Hx   . Rectal cancer Neg Hx  Past Surgical History:  Procedure Laterality Date  . BACK SURGERY  07/21/1978  . CHOLECYSTECTOMY N/A 08/15/2013   Procedure: LAPAROSCOPIC CHOLECYSTECTOMY WITH INTRAOPERATIVE CHOLANGIOGRAM;  Surgeon: Gwenyth Ober, MD;  Location: [facility];  Service: General;  Laterality: N/A;  . COLONOSCOPY N/A 01/09/2013   Procedure: COLONOSCOPY;  Surgeon: Beryle Beams, MD;  Location: [facility];  Service: Endoscopy;  Laterality: N/A;  . COLONOSCOPY  03/2018  . DIRECT LARYNGOSCOPY  07/2016   Dr. Nicolette Bang [facility]  . ESOPHAGOGASTRODUODENOSCOPY  03/2018  . ESOPHAGOGASTRODUODENOSCOPY (EGD) WITH PROPOFOL N/A 04/24/2019   Procedure: ESOPHAGOGASTRODUODENOSCOPY (EGD) WITH PROPOFOL;  Surgeon: Gatha Mayer, MD;  [facility];  Service: Endoscopy;  Laterality: N/A;  . GASTROSTOMY TUBE PLACEMENT  09/27/2016  . GIVENS CAPSULE STUDY N/A 04/24/2019   Procedure: GIVENS CAPSULE STUDY;  Surgeon: Gatha Mayer, MD;  [facility];  Service: Endoscopy;  Laterality: N/A;  . HERNIA REPAIR Left 1981  . IR PATIENT EVAL TECH 0-60 MINS  12/13/2016  . IR PATIENT EVAL TECH 0-60 MINS  04/11/2017  . IR PATIENT EVAL TECH 0-60 MINS  03/24/2018  . IR PATIENT EVAL TECH 0-60 MINS  11/23/2018  . IR PATIENT EVAL TECH 0-60 MINS  05/28/2019  . IR REPLACE G-TUBE SIMPLE WO FLUORO  12/12/2017  . IR REPLACE G-TUBE SIMPLE WO FLUORO  03/29/2018  . IR REPLACE G-TUBE SIMPLE WO FLUORO  06/29/2018  . IR REPLACE G-TUBE SIMPLE WO FLUORO  11/02/2018  . IR REPLACE G-TUBE SIMPLE WO FLUORO  03/08/2019  . IR REPLACE G-TUBE SIMPLE WO FLUORO  07/02/2019  . IR REPLACE G-TUBE SIMPLE WO FLUORO  11/30/2019  . IR  REPLACE G-TUBE SIMPLE WO FLUORO  12/27/2019  . IR REPLACE G-TUBE SIMPLE WO FLUORO  05/27/2020  . IR REPLACE G-TUBE SIMPLE WO FLUORO  07/09/2020  . MODIFIED RADICAL NECK DISSECTION Left 09/27/2016   Levels 1 & 2  . PARTIAL GLOSSECTOMY Left 09/27/2016   Left hemi partial glossectomy  . SPINE SURGERY  01/20/2016   fusion  . TONSILLECTOMY    . tracheotomy  09/27/2016  . TUBAL LIGATION  06/1988   Social History   Occupational History  . Occupation: Air cabin crew  Tobacco Use  . Smoking status: Never  . Smokeless tobacco: Never  Vaping Use  . Vaping Use: Never used  Substance and Sexual Activity  . Alcohol use: Yes    Alcohol/week: 7.0 standard drinks    Types: 7 Cans of beer per week  . Drug use: No  . Sexual activity: Yes    Birth control/protection: Surgical

## 2020-11-20 ENCOUNTER — Ambulatory Visit: Payer: Medicare Other | Admitting: Orthopedic Surgery

## 2020-11-25 ENCOUNTER — Other Ambulatory Visit: Payer: Self-pay | Admitting: Specialist

## 2020-11-25 ENCOUNTER — Ambulatory Visit: Payer: Medicare Other | Admitting: Orthopedic Surgery

## 2020-11-25 MED ORDER — NAPROXEN 500 MG PO TABS: 500.0000 mg | ORAL_TABLET | Freq: Two times a day (BID) | ORAL | 0 refills | Status: DC

## 2020-11-25 NOTE — Telephone Encounter (Signed)
Patient called needing Rx refilled Naproxen     The number to contact patient is [phone]

## 2020-11-28 ENCOUNTER — Ambulatory Visit (INDEPENDENT_AMBULATORY_CARE_PROVIDER_SITE_OTHER): Payer: Medicare Other | Admitting: Nurse Practitioner

## 2020-11-28 ENCOUNTER — Other Ambulatory Visit: Payer: Self-pay | Admitting: Internal Medicine

## 2020-11-28 ENCOUNTER — Encounter: Payer: Self-pay | Admitting: Nurse Practitioner

## 2020-11-28 VITALS — BP 90/70 | HR 92 | Ht 67.5 in | Wt 158.5 lb

## 2020-11-28 DIAGNOSIS — K529 Noninfective gastroenteritis and colitis, unspecified: Secondary | ICD-10-CM

## 2020-11-28 NOTE — Patient Instructions (Addendum)
If you are age 55 or younger, your body mass index should be between 19-25. Your Body mass index is 24.46 kg/m. If this is out of the aformentioned range listed, please consider follow up with your Primary Care Provider.   The [facility] providers would like to encourage you to use [facility] to communicate with providers for non-urgent requests or questions.  Due to long hold times on the telephone, sending your provider a message by [facility] may be faster and more efficient way to get a response. Please allow 48 business hours for a response.  Please remember that this is for non-urgent requests/questions.  RECOMMENDATIONS: Continue Lomotil as prescribed, hold if no bowel movement within 3-4 days. Follow up with Dr. Carlean Purl in 6 months. Our office will send you a reminder as it gets closer to that time.  It was great seeing you today! Thank you for entrusting me with your care and choosing [facility]  Noralyn Pick, CRNP

## 2020-11-28 NOTE — Progress Notes (Signed)
11/28/2020 JALESSA PEYSER [id number] 07-25-1965   Chief Complaint: Chronic diarrhea, medication refill   History of Present Illness: Kristen [name] is a 55 year old female with a past medical history of anxiety, depression, tongue cancer s/p radiation 2018, kidney stones, GERD, IDA and chronic diarrhea. Past cholecystectomy. She is followed by Dr .Carlean Purl.  She presents her office today for her annual review and to refill her Lomotil prescription.  She prescribed Lomotil 3 tabs p.o. twice daily but sometimes takes 4 p.o. twice daily.  She reports passing a formed hard stool once daily for the past few weeks.  However, about 6 weeks ago she took Lomotil 4 tabs twice daily daily and stated she did not pass any stool from the rectum for a month.  She stated if she stops Lomotil for one day her diarrhea recurs.  She denies having any nausea or vomiting.  No upper or lower abdominal pain.  She tolerates solid food p.o. and uses her PEG for her medications.  She reports her weight is stable.  CTAP 07/2020 showed an intact G-tube, diverticulosis and hepatic steatosis.  EGD 04/24/2019 was normal.  He small bowel capsule endoscopy was done on the same date and was normal.  Her most recent colonoscopy was 04/10/2018 which showed erythema to the distal sigmoid colon likely due to the bowel prep and diverticulosis.  No polyps were identified.  No GERD symptoms.  No other complaints at this time.  CBC Latest Ref Rng & Units 07/30/2020 07/25/2020 07/24/2020  WBC 4.0 - 10.5 K/uL 3.3(L) 5.0 4.4  Hemoglobin 12.0 - 15.0 g/dL 12.9 16.8(H) 17.5(H)  Hematocrit 36.0 - 46.0 % 38.7 52.8(H) 54.9(H)  Platelets 150.0 - 400.0 K/uL 188.0 218 240    CMP Latest Ref Rng & Units 07/30/2020 07/26/2020 07/25/2020  Glucose 70 - 99 mg/dL 75 102(H) 101(H)  BUN 6 - 23 mg/dL 8 13 13   Creatinine 0.40 - 1.20 mg/dL 0.68 1.01(H) 0.98  Sodium 135 - 145 mEq/L 139 136 134(L)  Potassium 3.5 - 5.1 mEq/L 4.6 4.6 4.3  Chloride 96 - 112 mEq/L 104  107 108  CO2 19 - 32 mEq/L 27 23 19(L)  Calcium 8.4 - 10.5 mg/dL 9.5 9.8 9.8  Total Protein 6.5 - 8.1 g/dL - - -  Total Bilirubin 0.3 - 1.2 mg/dL - - -  Alkaline Phos 38 - 126 U/L - - -  AST 15 - 41 U/L - - -  ALT 0 - 44 U/L - - -     CTAP with contrast 07/24/2020: 1. Scattered colonic diverticulosis with no acute diverticulitis. 2. Nonobstructive 5 mm left nephrolithiasis. 3. Hepatic steatosis. 4. Gastrostomy tube in appropriate position. 5.  Aortic Atherosclerosis   EGD 04/24/2019: - Normal esophagus. - Normal stomach. s/p gastrostomy (bumper seen) - Normal examined duodenum. - Successful completion of the Video Capsule Enteroscope placement. - No specimens collected.  Small bowel capsule endoscopy 04/24/2019: Incidental small bowel polyp  No evidence of IBD/Crohn's disease   EGD 04/10/2018: - Esophageal plaques were found, consistent with candidiasis. - Gastrostomy present. - Erythematous mucosa in the anterior wall of the stomach. - The examination was otherwise normal. - No specimens collected. - See the other procedure note for documentation of additional recommendations. - Diflucan (fluconazole) 100 mg PO daily 21 days take 200 mg day  Colonoscopy 04/10/2018: - Decreased sphincter tone found on digital rectal exam. - Erythematous and eroded mucosa in the distal sigmoid colon. Biopsied. - Diverticulosis in the  left colon. - The examination was otherwise normal on direct and retroflexion views. - Biopsies were taken with a cold forceps from the right colon and left colon for evaluation of microscopic colitis. Looping would not allow entry of terminal ileum -Cecum had gritty debris that I could not clear but remainder of colon exam was actually good - Recall colonoscopy 10 years   Current Outpatient Medications on File Prior to Visit  Medication Sig Dispense Refill   amLODipine (NORVASC) 10 MG tablet TAKE 1 TABLET BY MOUTH EVERY DAY 90 tablet 3   aspirin 81 MG chewable  tablet Place 81 mg into feeding tube daily.      baclofen (LIORESAL) 10 MG tablet PLACE 1 TABLET VIA FEEDING TUBE THREE TIMES DAILY 90 tablet 6   Calcium Citrate 200 MG TABS Place 200 mg into feeding tube daily at 12 noon.     clotrimazole (MYCELEX) 10 MG troche Take 1 tablet (10 mg total) by mouth 3 (three) times daily as needed (infection). 90 Troche 2   DEXILANT 60 MG capsule Place 1 capsule (60 mg total) into feeding tube daily. 30 capsule    diclofenac (VOLTAREN) 75 MG EC tablet TAKE 1 TABLET BY MOUTH TWICE DAILY WITH FOOD AS NEEDED FOR PAIN 180 tablet 2   diphenoxylate-atropine (LOMOTIL) 2.5-0.025 MG tablet PLACE 2 TABLETS INTO FEEDING TUBE TWICE DAILY 096 tablet 0   folic acid (FOLVITE) 283 MCG tablet Place 400 mcg into feeding tube daily.     gabapentin (NEURONTIN) 300 MG capsule Take 300 mg by mouth 3 (three) times daily.     Multiple Vitamin (MULTIVITAMIN WITH MINERALS) TABS tablet Place 1 tablet into feeding tube daily. 30 tablet 6   naproxen (NAPROSYN) 500 MG tablet Take 1 tablet (500 mg total) by mouth 2 (two) times daily with a meal. 40 tablet 0   potassium chloride SA (KLOR-CON) 20 MEQ tablet TAKE 1 TABLET BY MOUTH TWICE DAILY FOR THE NEXT 3 DAYS. OKAY TO CRUSH IF UNABLE TO SWALLOW. CAN ALSO TAKE PER TUBE IF NEEDED. 6 tablet 0   pregabalin (LYRICA) 75 MG capsule TAKE 1 CAPSULE(75 MG) BY MOUTH TWICE DAILY 60 capsule 0   spironolactone (ALDACTONE) 25 MG tablet Take 25 mg by mouth 2 (two) times daily.     traMADol (ULTRAM) 50 MG tablet TAKE 1 TABLET(50 MG) BY MOUTH EVERY 6 HOURS AS NEEDED 30 tablet 0   No current facility-administered medications on file prior to visit.   Allergies  Allergen Reactions   Elemental Sulfur Hives and Swelling   Other Hives and Swelling    tomato   Sulfa Antibiotics    Current Medications, Allergies, Past Medical History, Past Surgical History, Family History and Social History were reviewed in [employer] record.  Review of  Systems:   Constitutional: Negative for fever, sweats, chills or weight loss.  Respiratory: Negative for shortness of breath.   Cardiovascular: Negative for chest pain, palpitations and leg swelling.  Gastrointestinal: See HPI.  Musculoskeletal: Negative for back pain or muscle aches.  Neurological: Negative for dizziness, headaches or paresthesias.    Physical Exam: LMP 11/15/2013  General: 55 year old female in no acute distress. Head: Normocephalic and atraumatic. Eyes: No scleral icterus. Conjunctiva pink . Ears: Normal auditory acuity. Mouth: Dentition intact. No ulcers or lesions.  Lungs: Clear throughout to auscultation. Heart: Regular rate and rhythm, no murmur. Abdomen: Soft, nontender and nondistended. No masses or hepatomegaly. Normal bowel sounds x 4 quadrants. PEG site intact. Rectal: Deferred. Musculoskeletal:  Symmetrical with no gross deformities. Extremities: No edema. Neurological: Alert oriented x 4. No focal deficits.  Psychological: Alert and cooperative. Normal mood and affect  Assessment and Recommendations:  28) 55 year old female with chronic diarrhea controlled with Lomotil 3 p.o. twice daily.  She intermittently takes Lomotil 4 p.o. twice daily and recently reported going 30 days without passing any stool per the rectum.  She is passing a formed hard stool daily for the past 1 to 2 weeks. No abdominal pain.  -Lomotil 3 p.o. twice daily as needed, hold if no bowel movement in 3 to 4 days -Consider trial with Viberzi if she has recurrent significant constipation on Lomotil -Follow-up in the office with Dr. Carlean Purl in 6 months and as needed  2) IDA.  Hemoglobin 12.9 and iron 45 per labs 07/30/2020.  EGD and small bowel capsule endoscopy 04/24/2019 were unrevealing, etiology for IDA remains unclear.  Past celiac serology was negative.  Colonoscopy 03/2018 showed acute self-limited inflammation likely prep effect without evidence of IBD. -Recommend checking CBC and  iron levels every 6 months, next lab draw due 01/2022 -Continue ferrous sulfate 220 mgs per PEG daily

## 2020-12-09 ENCOUNTER — Other Ambulatory Visit: Payer: Self-pay

## 2020-12-09 ENCOUNTER — Ambulatory Visit (INDEPENDENT_AMBULATORY_CARE_PROVIDER_SITE_OTHER): Payer: Medicare Other | Admitting: Orthopedic Surgery

## 2020-12-09 ENCOUNTER — Encounter: Payer: Self-pay | Admitting: Orthopedic Surgery

## 2020-12-09 DIAGNOSIS — M1812 Unilateral primary osteoarthritis of first carpometacarpal joint, left hand: Secondary | ICD-10-CM | POA: Diagnosis not present

## 2020-12-09 DIAGNOSIS — M654 Radial styloid tenosynovitis [de Quervain]: Secondary | ICD-10-CM | POA: Diagnosis not present

## 2020-12-14 DIAGNOSIS — M1812 Unilateral primary osteoarthritis of first carpometacarpal joint, left hand: Secondary | ICD-10-CM | POA: Insufficient documentation

## 2020-12-14 DIAGNOSIS — M654 Radial styloid tenosynovitis [de Quervain]: Secondary | ICD-10-CM | POA: Insufficient documentation

## 2020-12-14 NOTE — Progress Notes (Signed)
Office Visit Note   Patient: Kristen [name]           Date of Birth: 11/26/1965           MRN: [id number] Visit Date: 12/09/2020              Requested by: [name], MD [place],  [place] [id number] PCP: [name], MD   Assessment & Plan: Visit Diagnoses:  1. Tenosynovitis, de Quervain   2. Arthritis of carpometacarpal (CMC) joint of left thumb     Plan: Discussed with patient that she seems to have both chronic end-stage thumb CMC osteoarthritis and first dorsal compartment tenosynovitis.  It seems like the majority of her current pain is at the level of the radial styloid.  We discussed treatment options for both conditions including bracing, activity modification, oral and topical anti-inflammatories, corticosteroid injections, and surgery.  At this point, she wants to try a corticosteroid injection into the first dorsal compartment.  I can see her back in a month or two if her symptoms have not improved.   Follow-Up Instructions: No follow-ups on file.   Orders:  No orders of the defined types were placed in this encounter.  No orders of the defined types were placed in this encounter.     Procedures: No procedures performed   Clinical Data: No additional findings.   Subjective: Chief Complaint  Patient presents with   Left Wrist - Pain, Weakness, Edema    Pain along the Left thumb, +n/t, swelling, weakness, pain stings, onset x for awhile, PAIN: 7.5/10, RIGHT handed    This is a 55 yo RHD F who presents with left radial sided and basilar thumb pain.  Her basilar thumb pain has been a chronic issue for her.  It can be 7.5/10 at worst.  She has tried oral NSAIDs, a thumb spica brace.  The oral naproxen helps some.  She also has more acute pain over the radial styloid that is worse w/ ulnar deviation of the wrist.   Weakness Associated symptoms include weakness.   Review of Systems  Neurological:  Positive for weakness.     Objective: Vital Signs: BP 115/78 (BP Location: Left Arm, Patient Position: Sitting)   Pulse 79   Ht 5' 7.5" (1.715 m)   Wt 158 lb 8 oz (71.9 kg)   LMP 11/15/2013   BMI 24.46 kg/m   Physical Exam Constitutional:      Appearance: Normal appearance.  Cardiovascular:     Rate and Rhythm: Normal rate.     Pulses: Normal pulses.  Pulmonary:     Effort: Pulmonary effort is normal.  Skin:    General: Skin is warm and dry.     Capillary Refill: Capillary refill takes less than 2 seconds.  Neurological:     Mental Status: She is alert.    Left Hand Exam   Tenderness  Left hand tenderness location: TTP at thumb CMC joint in area of arthritic deformity and at radial styloid along first dorsal compartment.   Other  Erythema: absent Sensation: normal Pulse: present  Comments:  TTP at both radial styloid/first dorsal compartment and at thumb [facility] joint w/ arthritic deformity.  + CMC grind test.  No MP hyper-extension.  + Finkelstein test.      Specialty Comments:  No specialty comments available.  Imaging: Multiple prevoius views of the left wrist reviewed and interpreted by me.  They demonstrate Eaton stage IV thumb CMC arthritis  with significant joint space narrowing, osteophytes, and involvement of the scaphotrapezoid joint space.    PMFS History: Patient Active Problem List   Diagnosis Date Noted   Tenosynovitis, de Quervain 12/14/2020   Arthritis of carpometacarpal [facility]) joint of left thumb 12/14/2020   Aortic atherosclerosis ([place]) 08/20/2020   Hyperkalemia 07/24/2020   Alcohol use 08/05/2019   Bilateral lower extremity edema 08/05/2019   Peripheral edema 03/12/2019   Iron deficiency anemia 12/11/2018   Loosening of hardware in spine [facility])    Other spondylosis with radiculopathy, lumbar region    History of lumbar spinal fusion 11/06/2018   UTI (urinary tract infection) 05/15/2017   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   Malignant  neoplasm of base of tongue ([place]) 10/29/2016   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[place]) 10/29/2016   Headache associated with sexual activity 05/14/2016   Tongue lesion 05/14/2016   Thrombocytopenia ([place]) 01/22/2016   Chronic diarrhea 01/22/2016   Chest pain    Essential hypertension    Spinal stenosis of lumbar region 01/20/2016    Class: Chronic   DDD (degenerative disc disease), lumbar 01/20/2016    Class: Chronic   Spinal stenosis of lumbar region with neurogenic claudication 01/20/2016   Low back pain 12/22/2015   Hypersomnolence 10/13/2015   Muscle cramp 08/01/2015   GERD (gastroesophageal reflux disease) 07/09/2015   Morbid obesity with BMI of 40.0-44.9, adult ([place]) 01/08/2013   Past Medical History:  Diagnosis Date   Allergy    Anemia    Iron deficiency   Anxiety    Arthritis    back, left shoulder   Blood transfusion without reported diagnosis    Chicken pox    Depression    Diarrhea    takes Imodium daily   GERD (gastroesophageal reflux disease)    H/O hiatal hernia    Headache(784.0)    History of kidney stones    1996ish   History of radiation therapy 11/16/16- 01/01/17   Base of Tongue/ 35 gy in 33 fractions/ Dose: 2 Gy   Hypertension    Iron deficiency anemia 12/11/2018   Migraine    none for 5 years (as of 01/13/16)   OSA (obstructive sleep apnea) 10/13/2015   unable to get cpap, plans to get one in 2018   Pneumonia    Restless legs    Scoliosis    Shingles 08/28/2013   Shortness of breath    with exertion   Sleep apnea    Tongue cancer ([place])    tongue cancer    Family History  Problem Relation Age of Onset   Heart disease Father    Heart attack Father    Hypertension Mother    Arthritis Mother    Diabetes Maternal Grandmother    Diabetes Paternal Grandmother    Diabetes Maternal Uncle    Colon polyps Neg Hx    Colon cancer Neg Hx    Esophageal cancer Neg Hx    Stomach cancer Neg Hx    Rectal cancer Neg Hx     Past Surgical  History:  Procedure Laterality Date   BACK SURGERY  07/21/1978   CHOLECYSTECTOMY N/A 08/15/2013   Procedure: LAPAROSCOPIC CHOLECYSTECTOMY WITH INTRAOPERATIVE CHOLANGIOGRAM;  Surgeon: Gwenyth Ober, MD;  Location: [place];  Service: General;  Laterality: N/A;   COLONOSCOPY N/A 01/09/2013   Procedure: COLONOSCOPY;  Surgeon: Beryle Beams, MD;  Location: [facility] ENDOSCOPY;  Service: Endoscopy;  Laterality: N/A;   COLONOSCOPY  03/2018  DIRECT LARYNGOSCOPY  07/2016   Dr. Nicolette Bang [facility]   ESOPHAGOGASTRODUODENOSCOPY  03/2018   ESOPHAGOGASTRODUODENOSCOPY (EGD) WITH PROPOFOL N/A 04/24/2019   Procedure: ESOPHAGOGASTRODUODENOSCOPY (EGD) WITH PROPOFOL;  Surgeon: Gatha Mayer, MD;  Location: [facility] ENDOSCOPY;  Service: Endoscopy;  Laterality: N/A;   GASTROSTOMY TUBE PLACEMENT  09/27/2016   GIVENS CAPSULE STUDY N/A 04/24/2019   Procedure: GIVENS CAPSULE STUDY;  Surgeon: Gatha Mayer, MD;  Location: [facility] ENDOSCOPY;  Service: Endoscopy;  Laterality: N/A;   HERNIA REPAIR Left 1981   IR PATIENT EVAL TECH 0-60 MINS  12/13/2016   IR PATIENT EVAL TECH 0-60 MINS  04/11/2017   IR PATIENT EVAL TECH 0-60 MINS  03/24/2018   IR PATIENT EVAL TECH 0-60 MINS  11/23/2018   IR PATIENT EVAL TECH 0-60 MINS  05/28/2019   IR REPLACE G-TUBE SIMPLE WO FLUORO  12/12/2017   IR REPLACE G-TUBE SIMPLE WO FLUORO  03/29/2018   IR REPLACE G-TUBE SIMPLE WO FLUORO  06/29/2018   IR REPLACE G-TUBE SIMPLE WO FLUORO  11/02/2018   IR REPLACE G-TUBE SIMPLE WO FLUORO  03/08/2019   IR REPLACE G-TUBE SIMPLE WO FLUORO  07/02/2019   IR REPLACE G-TUBE SIMPLE WO FLUORO  11/30/2019   IR REPLACE G-TUBE SIMPLE WO FLUORO  12/27/2019   IR REPLACE G-TUBE SIMPLE WO FLUORO  05/27/2020   IR REPLACE G-TUBE SIMPLE WO FLUORO  07/09/2020   MODIFIED RADICAL NECK DISSECTION Left 09/27/2016   Levels 1 & 2   PARTIAL GLOSSECTOMY Left 09/27/2016   Left hemi partial glossectomy   SPINE SURGERY  01/20/2016   fusion   TONSILLECTOMY     tracheotomy  09/27/2016   TUBAL LIGATION   06/1988   Social History   Occupational History   Occupation: Air cabin crew  Tobacco Use   Smoking status: Never   Smokeless tobacco: Never  Vaping Use   Vaping Use: Never used  Substance and Sexual Activity   Alcohol use: Yes    Alcohol/week: 7.0 standard drinks    Types: 7 Cans of beer per week   Drug use: No   Sexual activity: Yes    Birth control/protection: Surgical

## 2020-12-18 ENCOUNTER — Other Ambulatory Visit: Payer: Self-pay

## 2020-12-18 ENCOUNTER — Encounter: Payer: Self-pay | Admitting: Specialist

## 2020-12-18 ENCOUNTER — Ambulatory Visit (INDEPENDENT_AMBULATORY_CARE_PROVIDER_SITE_OTHER): Payer: Medicare Other | Admitting: Specialist

## 2020-12-18 VITALS — BP 109/71 | HR 80 | Ht 67.5 in | Wt 158.5 lb

## 2020-12-18 DIAGNOSIS — T84498A Other mechanical complication of other internal orthopedic devices, implants and grafts, initial encounter: Secondary | ICD-10-CM

## 2020-12-18 DIAGNOSIS — M4316 Spondylolisthesis, lumbar region: Secondary | ICD-10-CM

## 2020-12-18 DIAGNOSIS — M960 Pseudarthrosis after fusion or arthrodesis: Secondary | ICD-10-CM | POA: Diagnosis not present

## 2020-12-18 DIAGNOSIS — Z8739 Personal history of other diseases of the musculoskeletal system and connective tissue: Secondary | ICD-10-CM

## 2020-12-18 DIAGNOSIS — M4325 Fusion of spine, thoracolumbar region: Secondary | ICD-10-CM | POA: Diagnosis not present

## 2020-12-18 DIAGNOSIS — M1812 Unilateral primary osteoarthritis of first carpometacarpal joint, left hand: Secondary | ICD-10-CM | POA: Diagnosis not present

## 2020-12-18 DIAGNOSIS — M654 Radial styloid tenosynovitis [de Quervain]: Secondary | ICD-10-CM | POA: Diagnosis not present

## 2020-12-18 NOTE — Patient Instructions (Signed)
Avoid bending, stooping and avoid lifting weights greater than 10 lbs. Avoid prolong standing and walking. Order for a new walker with wheels. Surgery scheduling secretary Kandice Hams, will call you in the next week to schedule for surgery.  Surgery recommended is a right one level lumbar fusion L3-4 revision of rods with extension of the posterior rods and screws to T12 to the this would be done with rods, screws and cages with local bone graft and allograft (donor bone graft). Take hydrocodone for for pain. Risk of surgery includes risk of infection 1 in 200 patients, bleeding 1/2% chance you would need a transfusion.   Risk to the nerves is one in 10,000. You will need to use a brace for 3 months and wean from the brace on the 4th month. Expect improved walking and standing tolerance. Expect relief of leg pain but numbness may persist depending on the length and degree of pressure that has been present.

## 2020-12-18 NOTE — Progress Notes (Signed)
Office Visit Note   Patient: Kristen [name]           Date of Birth: 1965/11/01           MRN: [id number] Visit Date: 12/18/2020              Requested by: [name], MD [place],  [place] [address] PCP: [name], MD   Assessment & Plan: Visit Diagnoses:  1. Pseudarthrosis after fusion or arthrodesis   2. Fusion of spine of thoracolumbar region   3. Loosening of hardware in spine (HCC)   4. Spondylolisthesis of lumbar region   5. Tenosynovitis, de Quervain   6. Arthritis of carpometacarpal (CMC) joint of left thumb   7. History of senile osteoporosis     Plan: Avoid bending, stooping and avoid lifting weights greater than 10 lbs. Avoid prolong standing and walking. Order for a new walker with wheels. Surgery scheduling secretary Kandice Hams, will call you in the next week to schedule for surgery.  Surgery recommended is a right one level lumbar fusion L3-4 revision of rods with extension of the posterior rods and screws to T12 to the this would be done with rods, screws and cages with local bone graft and allograft (donor bone graft). Take hydrocodone for for pain. Risk of surgery includes risk of infection 1 in 200 patients, bleeding 1/2% chance you would need a transfusion.   Risk to the nerves is one in 10,000. You will need to use a brace for 3 months and wean from the brace on the 4th month. Expect improved walking and standing tolerance. Expect relief of leg pain but numbness may persist depending on the length and degree of pressure that has been present.    Follow-Up Instructions: Return in about 4 weeks (around 01/15/2021).   Orders:  Orders Placed This Encounter  Procedures   DG BONE DENSITY (DXA)   No orders of the defined types were placed in this encounter.     Procedures: No procedures performed   Clinical Data: No additional findings.   Subjective: Chief Complaint  Patient presents with   Left Hand -  Follow-up    She saw Dr. Tempie Donning, and she states that the brace that she was given by Dr. Louanne Skye makes it hurt worse, along with trying to use it to do anything    55 year old female with primarily back pain and radiation into the right leg. It is worse with driving and sitting to  long. Falling asleep sitting up the whole body will go numb. No bowel or bladder difficulty. She has difficulty walking distances and sitting for long periods. Has pain right anterior thigh anterior shin in to the top of the right foot. She has had previoius scoliosis surgery to L3 with thoracolumbar fusion then extension to the L5-S1 level. 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   Review of Systems  Constitutional: Negative.   HENT: Negative.    Eyes: Negative.   Respiratory: Negative.    Cardiovascular: Negative.   Gastrointestinal: Negative.   Endocrine: Negative.   Genitourinary: Negative.   Musculoskeletal: Negative.   Skin: Negative.   Allergic/Immunologic: Negative.   Neurological: Negative.   Hematological: Negative.   Psychiatric/Behavioral: Negative.      Objective: Vital Signs: BP 109/71 (BP Location: Left Arm, Patient Position: Sitting)   Pulse 80   Ht 5' 7.5" (1.715 m)   Wt 158 lb 8 oz (71.9 kg)  LMP 11/15/2013   BMI 24.46 kg/m   Physical Exam  Back Exam   Tenderness  The patient is experiencing tenderness in the lumbar and thoracic.  Range of Motion  Extension:  abnormal  Flexion:  abnormal  Lateral bend right:  abnormal  Lateral bend left:  abnormal  Rotation right:  abnormal  Rotation left:  abnormal   Other  Toe walk: normal Heel walk: normal Erythema: no back redness Scars: absent    Specialty Comments:  No specialty comments available.  Imaging: No results found.   PMFS History: Patient Active Problem List   Diagnosis Date Noted   Spinal stenosis of lumbar region 01/20/2016    Priority: High    Class: Chronic   DDD (degenerative disc disease),  lumbar 01/20/2016    Priority: High    Class: Chronic   Tenosynovitis, de Quervain 12/14/2020   Arthritis of carpometacarpal (CMC) joint of left thumb 12/14/2020   Aortic atherosclerosis ([place]) 08/20/2020   Hyperkalemia 07/24/2020   Alcohol use 08/05/2019   Bilateral lower extremity edema 08/05/2019   Peripheral edema 03/12/2019   Iron deficiency anemia 12/11/2018   Loosening of hardware in spine [facility])    Other spondylosis with radiculopathy, lumbar region    History of lumbar spinal fusion 11/06/2018   UTI (urinary tract infection) 05/15/2017   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   Malignant neoplasm of base of tongue ([place]) 10/29/2016   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[place]) 10/29/2016   Headache associated with sexual activity 05/14/2016   Tongue lesion 05/14/2016   Thrombocytopenia ([place]) 01/22/2016   Chronic diarrhea 01/22/2016   Chest pain    Essential hypertension    Spinal stenosis of lumbar region with neurogenic claudication 01/20/2016   Low back pain 12/22/2015   Hypersomnolence 10/13/2015   Muscle cramp 08/01/2015   GERD (gastroesophageal reflux disease) 07/09/2015   Morbid obesity with BMI of 40.0-44.9, adult ([place]) 01/08/2013   Past Medical History:  Diagnosis Date   Allergy    Anemia    Iron deficiency   Anxiety    Arthritis    back, left shoulder   Blood transfusion without reported diagnosis    Chicken pox    Depression    Diarrhea    takes Imodium daily   GERD (gastroesophageal reflux disease)    H/O hiatal hernia    Headache(784.0)    History of kidney stones    1996ish   History of radiation therapy 11/16/16- 01/01/17   Base of Tongue/ 18 gy in 33 fractions/ Dose: 2 Gy   Hypertension    Iron deficiency anemia 12/11/2018   Migraine    none for 5 years (as of 01/13/16)   OSA (obstructive sleep apnea) 10/13/2015   unable to get cpap, plans to get one in 2018   Pneumonia    Restless legs    Scoliosis    Shingles  08/28/2013   Shortness of breath    with exertion   Sleep apnea    Tongue cancer ([place])    tongue cancer    Family History  Problem Relation Age of Onset   Heart disease Father    Heart attack Father    Hypertension Mother    Arthritis Mother    Diabetes Maternal Grandmother    Diabetes Paternal Grandmother    Diabetes Maternal Uncle    Colon polyps Neg Hx    Colon cancer Neg Hx    Esophageal cancer Neg Hx  Stomach cancer Neg Hx    Rectal cancer Neg Hx     Past Surgical History:  Procedure Laterality Date   BACK SURGERY  07/21/1978   CHOLECYSTECTOMY N/A 08/15/2013   Procedure: LAPAROSCOPIC CHOLECYSTECTOMY WITH INTRAOPERATIVE CHOLANGIOGRAM;  Surgeon: Gwenyth Ober, MD;  Location: [place];  Service: General;  Laterality: N/A;   COLONOSCOPY N/A 01/09/2013   Procedure: COLONOSCOPY;  Surgeon: Beryle Beams, MD;  Location: [place];  Service: Endoscopy;  Laterality: N/A;   COLONOSCOPY  03/2018   DIRECT LARYNGOSCOPY  07/2016   Dr. Nicolette Bang [facility]   ESOPHAGOGASTRODUODENOSCOPY  03/2018   ESOPHAGOGASTRODUODENOSCOPY (EGD) WITH PROPOFOL N/A 04/24/2019   Procedure: ESOPHAGOGASTRODUODENOSCOPY (EGD) WITH PROPOFOL;  Surgeon: Gatha Mayer, MD;  [facility];  Service: Endoscopy;  Laterality: N/A;   GASTROSTOMY TUBE PLACEMENT  09/27/2016   GIVENS CAPSULE STUDY N/A 04/24/2019   Procedure: GIVENS CAPSULE STUDY;  Surgeon: Gatha Mayer, MD;  [facility];  Service: Endoscopy;  Laterality: N/A;   HERNIA REPAIR Left 1981   IR PATIENT EVAL TECH 0-60 MINS  12/13/2016   IR PATIENT EVAL TECH 0-60 MINS  04/11/2017   IR PATIENT EVAL TECH 0-60 MINS  03/24/2018   IR PATIENT EVAL TECH 0-60 MINS  11/23/2018   IR PATIENT EVAL TECH 0-60 MINS  05/28/2019   IR REPLACE G-TUBE SIMPLE WO FLUORO  12/12/2017   IR REPLACE G-TUBE SIMPLE WO FLUORO  03/29/2018   IR REPLACE G-TUBE SIMPLE WO FLUORO  06/29/2018   IR REPLACE G-TUBE SIMPLE WO FLUORO  11/02/2018   IR REPLACE G-TUBE SIMPLE WO FLUORO   03/08/2019   IR REPLACE G-TUBE SIMPLE WO FLUORO  07/02/2019   IR REPLACE G-TUBE SIMPLE WO FLUORO  11/30/2019   IR REPLACE G-TUBE SIMPLE WO FLUORO  12/27/2019   IR REPLACE G-TUBE SIMPLE WO FLUORO  05/27/2020   IR REPLACE G-TUBE SIMPLE WO FLUORO  07/09/2020   MODIFIED RADICAL NECK DISSECTION Left 09/27/2016   Levels 1 & 2   PARTIAL GLOSSECTOMY Left 09/27/2016   Left hemi partial glossectomy   SPINE SURGERY  01/20/2016   fusion   TONSILLECTOMY     tracheotomy  09/27/2016   TUBAL LIGATION  06/1988   Social History   Occupational History   Occupation: Air cabin crew  Tobacco Use   Smoking status: Never   Smokeless tobacco: Never  Vaping Use   Vaping Use: Never used  Substance and Sexual Activity   Alcohol use: Yes    Alcohol/week: 7.0 standard drinks    Types: 7 Cans of beer per week   Drug use: No   Sexual activity: Yes    Birth control/protection: Surgical

## 2020-12-30 ENCOUNTER — Other Ambulatory Visit: Payer: Self-pay | Admitting: Specialist

## 2020-12-31 ENCOUNTER — Other Ambulatory Visit: Payer: Self-pay | Admitting: Radiology

## 2020-12-31 ENCOUNTER — Other Ambulatory Visit: Payer: Self-pay | Admitting: Specialist

## 2020-12-31 MED ORDER — DICLOFENAC SODIUM 75 MG PO TBEC: DELAYED_RELEASE_TABLET | ORAL | 3 refills | Status: DC

## 2020-12-31 MED ORDER — BACLOFEN 10 MG PO TABS: 10.0000 mg | ORAL_TABLET | Freq: Three times a day (TID) | ORAL | 6 refills | Status: DC

## 2021-01-07 ENCOUNTER — Telehealth: Payer: Self-pay | Admitting: Nurse Practitioner

## 2021-01-07 MED ORDER — DIPHENOXYLATE-ATROPINE 2.5-0.025 MG PO TABS: ORAL_TABLET | ORAL | 4 refills | Status: DC

## 2021-01-07 NOTE — Telephone Encounter (Signed)
Refilled w/ 4 more RF

## 2021-01-07 NOTE — Telephone Encounter (Signed)
Seen 11/28/20 by Johnny Bridge, please advise on Lomotil refill, thank you.

## 2021-01-07 NOTE — Telephone Encounter (Signed)
Inbound call from patient requesting a medication refill for lomotil 2.73m

## 2021-01-08 ENCOUNTER — Ambulatory Visit (INDEPENDENT_AMBULATORY_CARE_PROVIDER_SITE_OTHER): Payer: Medicare Other | Admitting: Orthopedic Surgery

## 2021-01-08 ENCOUNTER — Other Ambulatory Visit: Payer: Self-pay

## 2021-01-08 ENCOUNTER — Encounter: Payer: Self-pay | Admitting: Orthopedic Surgery

## 2021-01-08 VITALS — BP 111/79 | HR 100

## 2021-01-08 DIAGNOSIS — M1812 Unilateral primary osteoarthritis of first carpometacarpal joint, left hand: Secondary | ICD-10-CM

## 2021-01-08 NOTE — Progress Notes (Signed)
Office Visit Note   Patient: Kristen [name]           Date of Birth: 12/10/1965           MRN: [id number] Visit Date: 01/08/2021              Requested by: [name], MD [place],  [place] [address] PCP: [name], MD   Assessment & Plan: Visit Diagnoses:  1. Arthritis of carpometacarpal (CMC) joint of left thumb     Plan: We discussed the diagnosis, prognosis, and both conservative and operative treatment options for thumb CMC arthritis.  She has tried topical and oral NSAIDs and bracing with continued symptoms.  She is not interested in corticosteroid injection.   After our discussion, the patient has elected to proceed with LRTI.  We reviewed the benefits of surgery and the potential risks including, but not limited to, persistent symptoms, infection, damage to nearby nerves and blood vessels, delayed wound healing, need for additional surgery.    All patient concerns and questions were addressed.  A surgical date will be confirmed with the patient.    Follow-Up Instructions: No follow-ups on file.   Orders:  No orders of the defined types were placed in this encounter.  No orders of the defined types were placed in this encounter.     Procedures: No procedures performed   Clinical Data: No additional findings.   Subjective: Chief Complaint  Patient presents with   Left Wrist - Oligomenorrhea, Follow-up    This is a 55 year old right-hand-dominant female who presents with continued pain at the left base of her thumb.  She was last seen in November at which time most of her symptoms seem to be related to the radial styloid.  She underwent corticosteroid junction of the first dorsal compartment.  This area has improved, however, she still has continued pain at the base of the thumb.  This is quite severe and quite bothersome for her.  She has been taking naproxen with some symptom relief.  She was given a thumb spica brace which  she notes has not helped and has been quite uncomfortable.    Review of Systems   Objective: Vital Signs: BP 111/79 (BP Location: Right Arm, Patient Position: Sitting, Cuff Size: Large)    Pulse 100    LMP 11/15/2013    SpO2 96%   Physical Exam Constitutional:      Appearance: Normal appearance.  Cardiovascular:     Rate and Rhythm: Normal rate.     Pulses: Normal pulses.  Pulmonary:     Effort: Pulmonary effort is normal.  Skin:    General: Skin is warm and dry.     Capillary Refill: Capillary refill takes less than 2 seconds.  Neurological:     Mental Status: She is alert.    Left Hand Exam   Tenderness  Left hand tenderness location: Significant tenderness at thumb CMC joint w/ moderate swelling.   Other  Erythema: absent Sensation: normal Pulse: present  Comments:  + CMC grind test w/ pain and crepitus.  No static or dynamic MP hyper-extension.  No pain w/ ROM at the MP joint.      Specialty Comments:  No specialty comments available.  Imaging: No results found.   PMFS History: Patient Active Problem List   Diagnosis Date Noted   Tenosynovitis, de Quervain 12/14/2020   Arthritis of carpometacarpal [facility]) joint of left thumb 12/14/2020   Aortic atherosclerosis ([place])  08/20/2020   Hyperkalemia 07/24/2020   Alcohol use 08/05/2019   Bilateral lower extremity edema 08/05/2019   Peripheral edema 03/12/2019   Iron deficiency anemia 12/11/2018   Loosening of hardware in spine [facility])    Other spondylosis with radiculopathy, lumbar region    History of lumbar spinal fusion 11/06/2018   UTI (urinary tract infection) 05/15/2017   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   Malignant neoplasm of base of tongue ([place]) 10/29/2016   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[place]) 10/29/2016   Headache associated with sexual activity 05/14/2016   Tongue lesion 05/14/2016   Thrombocytopenia ([place]) 01/22/2016   Chronic diarrhea 01/22/2016   Chest pain     Essential hypertension    Spinal stenosis of lumbar region 01/20/2016    Class: Chronic   DDD (degenerative disc disease), lumbar 01/20/2016    Class: Chronic   Spinal stenosis of lumbar region with neurogenic claudication 01/20/2016   Low back pain 12/22/2015   Hypersomnolence 10/13/2015   Muscle cramp 08/01/2015   GERD (gastroesophageal reflux disease) 07/09/2015   Morbid obesity with BMI of 40.0-44.9, adult ([place]) 01/08/2013   Past Medical History:  Diagnosis Date   Allergy    Anemia    Iron deficiency   Anxiety    Arthritis    back, left shoulder   Blood transfusion without reported diagnosis    Chicken pox    Depression    Diarrhea    takes Imodium daily   GERD (gastroesophageal reflux disease)    H/O hiatal hernia    Headache(784.0)    History of kidney stones    1996ish   History of radiation therapy 11/16/16- 01/01/17   Base of Tongue/ 38 gy in 33 fractions/ Dose: 2 Gy   Hypertension    Iron deficiency anemia 12/11/2018   Migraine    none for 5 years (as of 01/13/16)   OSA (obstructive sleep apnea) 10/13/2015   unable to get cpap, plans to get one in 2018   Pneumonia    Restless legs    Scoliosis    Shingles 08/28/2013   Shortness of breath    with exertion   Sleep apnea    Tongue cancer ([place])    tongue cancer    Family History  Problem Relation Age of Onset   Heart disease Father    Heart attack Father    Hypertension Mother    Arthritis Mother    Diabetes Maternal Grandmother    Diabetes Paternal Grandmother    Diabetes Maternal Uncle    Colon polyps Neg Hx    Colon cancer Neg Hx    Esophageal cancer Neg Hx    Stomach cancer Neg Hx    Rectal cancer Neg Hx     Past Surgical History:  Procedure Laterality Date   BACK SURGERY  07/21/1978   CHOLECYSTECTOMY N/A 08/15/2013   Procedure: LAPAROSCOPIC CHOLECYSTECTOMY WITH INTRAOPERATIVE CHOLANGIOGRAM;  Surgeon: Gwenyth Ober, MD;  Location: [place];  Service: General;  Laterality: N/A;   COLONOSCOPY N/A  01/09/2013   Procedure: COLONOSCOPY;  Surgeon: Beryle Beams, MD;  Location: [facility] END[facility]COPY;  Service: Endoscopy;  Laterality: N/A;   COLONOSCOPY  03/2018   DIRECT LARYNGOSCOPY  07/2016   Dr. Nicolette Bang [facility]   ESOPHAGOGASTRODUODENOSCOPY  03/2018   ESOPHAGOGASTRODUODENOSCOPY (EGD) WITH PROPOFOL N/A 04/24/2019   Procedure: ESOPHAGOGASTRODUODENOSCOPY (EGD) WITH PROPOFOL;  Surgeon: Gatha Mayer, MD;  [facility];  Service: Endoscopy;  Laterality: N/A;   GASTROSTOMY TUBE  PLACEMENT  09/27/2016   GIVENS CAPSULE STUDY N/A 04/24/2019   Procedure: GIVENS CAPSULE STUDY;  Surgeon: Gatha Mayer, MD;  [facility];  Service: Endoscopy;  Laterality: N/A;   HERNIA REPAIR Left 1981   IR PATIENT EVAL TECH 0-60 MINS  12/13/2016   IR PATIENT EVAL TECH 0-60 MINS  04/11/2017   IR PATIENT EVAL TECH 0-60 MINS  03/24/2018   IR PATIENT EVAL TECH 0-60 MINS  11/23/2018   IR PATIENT EVAL TECH 0-60 MINS  05/28/2019   IR REPLACE G-TUBE SIMPLE WO FLUORO  12/12/2017   IR REPLACE G-TUBE SIMPLE WO FLUORO  03/29/2018   IR REPLACE G-TUBE SIMPLE WO FLUORO  06/29/2018   IR REPLACE G-TUBE SIMPLE WO FLUORO  11/02/2018   IR REPLACE G-TUBE SIMPLE WO FLUORO  03/08/2019   IR REPLACE G-TUBE SIMPLE WO FLUORO  07/02/2019   IR REPLACE G-TUBE SIMPLE WO FLUORO  11/30/2019   IR REPLACE G-TUBE SIMPLE WO FLUORO  12/27/2019   IR REPLACE G-TUBE SIMPLE WO FLUORO  05/27/2020   IR REPLACE G-TUBE SIMPLE WO FLUORO  07/09/2020   MODIFIED RADICAL NECK DISSECTION Left 09/27/2016   Levels 1 & 2   PARTIAL GLOSSECTOMY Left 09/27/2016   Left hemi partial glossectomy   SPINE SURGERY  01/20/2016   fusion   TONSILLECTOMY     tracheotomy  09/27/2016   TUBAL LIGATION  06/1988   Social History   Occupational History   Occupation: Air cabin crew  Tobacco Use   Smoking status: Never   Smokeless tobacco: Never  Vaping Use   Vaping Use: Never used  Substance and Sexual Activity   Alcohol use: Yes    Alcohol/week: 7.0 standard drinks     Types: 7 Cans of beer per week   Drug use: No   Sexual activity: Yes    Birth control/protection: Surgical

## 2021-01-08 NOTE — H&P (View-Only) (Signed)
Office Visit Note   Patient: Kristen [name]           Date of Birth: 11/03/1965           MRN: [id number] Visit Date: 01/08/2021              Requested by: [name], MD [place],  [place] [address] PCP: [name], MD   Assessment & Plan: Visit Diagnoses:  1. Arthritis of carpometacarpal (CMC) joint of left thumb     Plan: We discussed the diagnosis, prognosis, and both conservative and operative treatment options for thumb CMC arthritis.  She has tried topical and oral NSAIDs and bracing with continued symptoms.  She is not interested in corticosteroid injection.   After our discussion, the patient has elected to proceed with LRTI.  We reviewed the benefits of surgery and the potential risks including, but not limited to, persistent symptoms, infection, damage to nearby nerves and blood vessels, delayed wound healing, need for additional surgery.    All patient concerns and questions were addressed.  A surgical date will be confirmed with the patient.    Follow-Up Instructions: No follow-ups on file.   Orders:  No orders of the defined types were placed in this encounter.  No orders of the defined types were placed in this encounter.     Procedures: No procedures performed   Clinical Data: No additional findings.   Subjective: Chief Complaint  Patient presents with   Left Wrist - Oligomenorrhea, Follow-up    This is a 55 year old right-hand-dominant female who presents with continued pain at the left base of her thumb.  She was last seen in November at which time most of her symptoms seem to be related to the radial styloid.  She underwent corticosteroid junction of the first dorsal compartment.  This area has improved, however, she still has continued pain at the base of the thumb.  This is quite severe and quite bothersome for her.  She has been taking naproxen with some symptom relief.  She was given a thumb spica brace which  she notes has not helped and has been quite uncomfortable.    Review of Systems   Objective: Vital Signs: BP 111/79 (BP Location: Right Arm, Patient Position: Sitting, Cuff Size: Large)    Pulse 100    LMP 11/15/2013    SpO2 96%   Physical Exam Constitutional:      Appearance: Normal appearance.  Cardiovascular:     Rate and Rhythm: Normal rate.     Pulses: Normal pulses.  Pulmonary:     Effort: Pulmonary effort is normal.  Skin:    General: Skin is warm and dry.     Capillary Refill: Capillary refill takes less than 2 seconds.  Neurological:     Mental Status: She is alert.    Left Hand Exam   Tenderness  Left hand tenderness location: Significant tenderness at thumb CMC joint w/ moderate swelling.   Other  Erythema: absent Sensation: normal Pulse: present  Comments:  + CMC grind test w/ pain and crepitus.  No static or dynamic MP hyper-extension.  No pain w/ ROM at the MP joint.      Specialty Comments:  No specialty comments available.  Imaging: No results found.   PMFS History: Patient Active Problem List   Diagnosis Date Noted   Tenosynovitis, de Quervain 12/14/2020   Arthritis of carpometacarpal [facility]) joint of left thumb 12/14/2020   Aortic atherosclerosis ([place])  08/20/2020   Hyperkalemia 07/24/2020   Alcohol use 08/05/2019   Bilateral lower extremity edema 08/05/2019   Peripheral edema 03/12/2019   Iron deficiency anemia 12/11/2018   Loosening of hardware in spine [facility])    Other spondylosis with radiculopathy, lumbar region    History of lumbar spinal fusion 11/06/2018   UTI (urinary tract infection) 05/15/2017   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   Malignant neoplasm of base of tongue ([place]) 10/29/2016   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[place]) 10/29/2016   Headache associated with sexual activity 05/14/2016   Tongue lesion 05/14/2016   Thrombocytopenia ([place]) 01/22/2016   Chronic diarrhea 01/22/2016   Chest pain     Essential hypertension    Spinal stenosis of lumbar region 01/20/2016    Class: Chronic   DDD (degenerative disc disease), lumbar 01/20/2016    Class: Chronic   Spinal stenosis of lumbar region with neurogenic claudication 01/20/2016   Low back pain 12/22/2015   Hypersomnolence 10/13/2015   Muscle cramp 08/01/2015   GERD (gastroesophageal reflux disease) 07/09/2015   Morbid obesity with BMI of 40.0-44.9, adult ([place]) 01/08/2013   Past Medical History:  Diagnosis Date   Allergy    Anemia    Iron deficiency   Anxiety    Arthritis    back, left shoulder   Blood transfusion without reported diagnosis    Chicken pox    Depression    Diarrhea    takes Imodium daily   GERD (gastroesophageal reflux disease)    H/O hiatal hernia    Headache(784.0)    History of kidney stones    1996ish   History of radiation therapy 11/16/16- 01/01/17   Base of Tongue/ 30 gy in 33 fractions/ Dose: 2 Gy   Hypertension    Iron deficiency anemia 12/11/2018   Migraine    none for 5 years (as of 01/13/16)   OSA (obstructive sleep apnea) 10/13/2015   unable to get cpap, plans to get one in 2018   Pneumonia    Restless legs    Scoliosis    Shingles 08/28/2013   Shortness of breath    with exertion   Sleep apnea    Tongue cancer ([place])    tongue cancer    Family History  Problem Relation Age of Onset   Heart disease Father    Heart attack Father    Hypertension Mother    Arthritis Mother    Diabetes Maternal Grandmother    Diabetes Paternal Grandmother    Diabetes Maternal Uncle    Colon polyps Neg Hx    Colon cancer Neg Hx    Esophageal cancer Neg Hx    Stomach cancer Neg Hx    Rectal cancer Neg Hx     Past Surgical History:  Procedure Laterality Date   BACK SURGERY  07/21/1978   CHOLECYSTECTOMY N/A 08/15/2013   Procedure: LAPAROSCOPIC CHOLECYSTECTOMY WITH INTRAOPERATIVE CHOLANGIOGRAM;  Surgeon: Gwenyth Ober, MD;  Location: [facility];  Service: General;  Laterality: N/A;   COLONOSCOPY N/A  01/09/2013   Procedure: COLONOSCOPY;  Surgeon: Beryle Beams, MD;  Location: [facility] ENDOSCOPY;  Service: Endoscopy;  Laterality: N/A;   COLONOSCOPY  03/2018   DIRECT LARYNGOSCOPY  07/2016   Dr. Nicolette Bang [facility]   ESOPHAGOGASTRODUODENOSCOPY  03/2018   ESOPHAGOGASTRODUODENOSCOPY (EGD) WITH PROPOFOL N/A 04/24/2019   Procedure: ESOPHAGOGASTRODUODENOSCOPY (EGD) WITH PROPOFOL;  Surgeon: Gatha Mayer, MD;  Location: [facility] ENDOSCOPY;  Service: Endoscopy;  Laterality: N/A;   GASTROSTOMY TUBE  PLACEMENT  09/27/2016   GIVENS CAPSULE STUDY N/A 04/24/2019   Procedure: GIVENS CAPSULE STUDY;  Surgeon: Gatha Mayer, MD;  Location: [facility] ENDOSCOPY;  Service: Endoscopy;  Laterality: N/A;   HERNIA REPAIR Left 1981   IR PATIENT EVAL TECH 0-60 MINS  12/13/2016   IR PATIENT EVAL TECH 0-60 MINS  04/11/2017   IR PATIENT EVAL TECH 0-60 MINS  03/24/2018   IR PATIENT EVAL TECH 0-60 MINS  11/23/2018   IR PATIENT EVAL TECH 0-60 MINS  05/28/2019   IR REPLACE G-TUBE SIMPLE WO FLUORO  12/12/2017   IR REPLACE G-TUBE SIMPLE WO FLUORO  03/29/2018   IR REPLACE G-TUBE SIMPLE WO FLUORO  06/29/2018   IR REPLACE G-TUBE SIMPLE WO FLUORO  11/02/2018   IR REPLACE G-TUBE SIMPLE WO FLUORO  03/08/2019   IR REPLACE G-TUBE SIMPLE WO FLUORO  07/02/2019   IR REPLACE G-TUBE SIMPLE WO FLUORO  11/30/2019   IR REPLACE G-TUBE SIMPLE WO FLUORO  12/27/2019   IR REPLACE G-TUBE SIMPLE WO FLUORO  05/27/2020   IR REPLACE G-TUBE SIMPLE WO FLUORO  07/09/2020   MODIFIED RADICAL NECK DISSECTION Left 09/27/2016   Levels 1 & 2   PARTIAL GLOSSECTOMY Left 09/27/2016   Left hemi partial glossectomy   SPINE SURGERY  01/20/2016   fusion   TONSILLECTOMY     tracheotomy  09/27/2016   TUBAL LIGATION  06/1988   Social History   Occupational History   Occupation: Air cabin crew  Tobacco Use   Smoking status: Never   Smokeless tobacco: Never  Vaping Use   Vaping Use: Never used  Substance and Sexual Activity   Alcohol use: Yes    Alcohol/week: 7.0 standard drinks     Types: 7 Cans of beer per week   Drug use: No   Sexual activity: Yes    Birth control/protection: Surgical

## 2021-01-13 ENCOUNTER — Ambulatory Visit (INDEPENDENT_AMBULATORY_CARE_PROVIDER_SITE_OTHER): Payer: Medicare Other

## 2021-01-13 ENCOUNTER — Other Ambulatory Visit: Payer: Self-pay

## 2021-01-13 ENCOUNTER — Ambulatory Visit (INDEPENDENT_AMBULATORY_CARE_PROVIDER_SITE_OTHER): Payer: Medicare Other | Admitting: Family Medicine

## 2021-01-13 ENCOUNTER — Encounter: Payer: Self-pay | Admitting: Family Medicine

## 2021-01-13 VITALS — BP 102/70 | HR 82 | Resp 16 | Ht 67.5 in | Wt 153.5 lb

## 2021-01-13 DIAGNOSIS — J988 Other specified respiratory disorders: Secondary | ICD-10-CM

## 2021-01-13 DIAGNOSIS — M549 Dorsalgia, unspecified: Secondary | ICD-10-CM

## 2021-01-13 DIAGNOSIS — N390 Urinary tract infection, site not specified: Secondary | ICD-10-CM

## 2021-01-13 DIAGNOSIS — R051 Acute cough: Secondary | ICD-10-CM

## 2021-01-13 DIAGNOSIS — R350 Frequency of micturition: Secondary | ICD-10-CM

## 2021-01-13 LAB — POCT URINALYSIS DIPSTICK
Bilirubin, UA: NEGATIVE
Blood, UA: POSITIVE
Glucose, UA: NEGATIVE
Ketones, UA: NEGATIVE
Nitrite, UA: POSITIVE
Protein, UA: POSITIVE — AB
Spec Grav, UA: 1.015 (ref 1.010–1.025)
Urobilinogen, UA: 0.2 E.U./dL
pH, UA: 6 (ref 5.0–8.0)

## 2021-01-13 LAB — POC INFLUENZA A&B (BINAX/QUICKVUE)
Influenza A, POC: NEGATIVE
Influenza B, POC: NEGATIVE

## 2021-01-13 MED ORDER — BENZONATATE 100 MG PO CAPS
200.0000 mg | ORAL_CAPSULE | Freq: Two times a day (BID) | ORAL | 0 refills | Status: AC | PRN
Start: 2021-01-13 — End: 2021-01-23

## 2021-01-13 MED ORDER — LEVOFLOXACIN 500 MG PO TABS: 500.0000 mg | ORAL_TABLET | Freq: Every day | ORAL | 0 refills | Status: AC

## 2021-01-13 NOTE — Patient Instructions (Addendum)
A few things to remember from today's visit:  Acute cough - Plan: DG Chest 2 View, POC Influenza A&B(BINAX/QUICKVUE)  Respiratory tract infection - Plan: DG Chest 2 View, POC Influenza A&B(BINAX/QUICKVUE), levofloxacin (LEVAQUIN) 500 MG tablet  Back pain, unspecified back location, unspecified back pain laterality, unspecified chronicity - Plan: POCT urinalysis dipstick, Culture, Urine  Urinary tract infection without hematuria, site unspecified - Plan: levofloxacin (LEVAQUIN) 500 MG tablet  If you need refills please call your pharmacy. Do not use My Chart to request refills or for acute issues that need immediate attention.   Adequate fluid intake, avoid holding urine for long hours, and over the counter Vit C OR cranberry capsules might help.  Today we will treat empirically with antibiotic, which we might need to change when urine culture comes back depending of bacteria susceptibility. I recommend Levofloxacin to also cover for respiratory infection, please let me know if any side effect.  Seek immediate medical attention if severe abdominal pain, vomiting, fever/chills, or worsening symptoms. F/U if symptomatic are not any better after 2-3 days of antibiotic treatment.  Please be sure medication list is accurate. If a new problem present, please set up appointment sooner than planned today.

## 2021-01-13 NOTE — Progress Notes (Signed)
[facility]   ACUTE VISIT Chief Complaint  Patient presents with   Urinary Tract Infection    X 2 weeks, no burning or pain.    HPI: Kristen [name] is a 55 y.o. female with hx of HTN,GERD,chronic diarrhea, tongue malignancy,and chronic back pain here today complaining of 2 weeks of urine frequency and dark urine. Reporting hx of UTI's intermittent for 4 years. Hx of urgency incontinence, stable. She has not taken OTC medication.  Urinary Frequency  This is a new problem. The current episode started 1 to 4 weeks ago. The problem has been unchanged. The pain is at a severity of 0/10. The patient is experiencing no pain. There has been no fever. She is Not sexually active. There is No history of pyelonephritis. Associated symptoms include chills and frequency. Pertinent negatives include no discharge, hematuria, hesitancy, nausea or possible pregnancy. Her past medical history is significant for kidney stones and recurrent UTIs.  Ucx 07/30/20: SPECIMEN QUALITY: Adequate   Sample Source NOT GIVEN   STATUS: FINAL   ISOLATE 1: Enterobacter cloacae complex Abnormal    Comment: Greater than 100,000 CFU/mL of Enterobacter cloacae complex    URINE CULTURE, REFLEX    AMOX/CLAVULANIC >=32  Resistant    CEFAZOLIN >=64  Resistant 1    CEFEPIME <=1  Sensitive    CEFTAZIDIME <=1  Sensitive    CEFTRIAXONE <=1  Sensitive    CIPROFLOXACIN <=0.25  Sensitive    GENTAMICIN <=1  Sensitive    IMIPENEM <=0.25  Sensitive    LEVOFLOXACIN <=0.12  Sensitive    NITROFURANTOIN 64  Intermediate    PIP/TAZO <=4  Sensitive    TOBRAMYCIN <=1  Sensitive    TRIMETH/SULFA <=20  Sensitive 2               C/O upper and lower back pain, no more than usual. No hx of trauma.  Coughing for 3 days. Chills, she has not noted fever. Negative for sick contact. 01/10/21 negative home COVID 19 test. +Nasal congestion and rhinorrhea. Denies sore throat, SOB, wheezing,or CP.  She has not tried OTC  medications.  Review of Systems  Constitutional:  Positive for chills and fatigue. Negative for activity change and appetite change.  HENT:  Negative for ear pain and sinus pain.   Eyes:  Negative for discharge and redness.  Cardiovascular:  Negative for palpitations and leg swelling.  Gastrointestinal:  Negative for abdominal pain and nausea.       Negative for changes in bowel movements.  Genitourinary:  Positive for frequency. Negative for difficulty urinating, hematuria, hesitancy, pelvic pain, vaginal bleeding and vaginal discharge.  Musculoskeletal:  Positive for back pain. Negative for gait problem.  Skin:  Negative for rash.  Allergic/Immunologic: Positive for environmental allergies.  Neurological:  Negative for syncope.  Psychiatric/Behavioral:  Negative for confusion. The patient is nervous/anxious.   Rest see pertinent positives and negatives per HPI.  Current Outpatient Medications on File Prior to Visit  Medication Sig Dispense Refill   amLODipine (NORVASC) 10 MG tablet TAKE 1 TABLET BY MOUTH EVERY DAY 90 tablet 3   aspirin 81 MG chewable tablet Place 81 mg into feeding tube daily.      baclofen (LIORESAL) 10 MG tablet Take 1 tablet (10 mg total) by mouth 3 (three) times daily. 90 tablet 6   Calcium Citrate 200 MG TABS Place 200 mg into feeding tube daily at 12 noon.     Cholecalciferol (VITAMIN D3) 10 MCG (400 UNIT) tablet Take 400  Units by mouth daily.     clotrimazole (MYCELEX) 10 MG troche Take 1 tablet (10 mg total) by mouth 3 (three) times daily as needed (infection). (Patient taking differently: Take 10 mg by mouth 3 (three) times daily.) 90 Troche 2   DEXILANT 60 MG capsule Place 1 capsule (60 mg total) into feeding tube daily. 30 capsule    diclofenac (VOLTAREN) 75 MG EC tablet TAKE 1 TABLET BY MOUTH TWICE DAILY WITH FOOD AS NEEDED FOR PAIN 180 tablet 3   diphenoxylate-atropine (LOMOTIL) 2.5-0.025 MG tablet PLACE 2 TABLETS INTO FEEDING TUBE TWICE DAILY 120 tablet 4    ferrous sulfate 220 (44 Fe) MG/5ML solution PLACE 6.8MLS(300MG) INTO FEEDING TUBE DAILY 782 mL 11   folic acid (FOLVITE) 956 MCG tablet Place 400 mcg into feeding tube daily.     gabapentin (NEURONTIN) 300 MG capsule Take 300 mg by mouth daily.     potassium chloride SA (KLOR-CON) 20 MEQ tablet TAKE 1 TABLET BY MOUTH TWICE DAILY FOR THE NEXT 3 DAYS. OKAY TO CRUSH IF UNABLE TO SWALLOW. CAN ALSO TAKE PER TUBE IF NEEDED. (Patient taking differently: 20 mEq at bedtime. OKAY TO CRUSH IF UNABLE TO SWALLOW. CAN ALSO TAKE PER TUBE IF NEEDED.) 6 tablet 0   pregabalin (LYRICA) 75 MG capsule TAKE 1 CAPSULE(75 MG) BY MOUTH TWICE DAILY 60 capsule 0   spironolactone (ALDACTONE) 25 MG tablet Take 25 mg by mouth 2 (two) times daily.     traMADol (ULTRAM) 50 MG tablet TAKE 1 TABLET(50 MG) BY MOUTH EVERY 6 HOURS AS NEEDED 30 tablet 0   No current facility-administered medications on file prior to visit.   Past Medical History:  Diagnosis Date   Allergy    Anemia    Iron deficiency   Anxiety    Arthritis    back, left shoulder   Blood transfusion without reported diagnosis    Chicken pox    Depression    Diarrhea    takes Imodium daily   GERD (gastroesophageal reflux disease)    H/O hiatal hernia    Headache(784.0)    History of kidney stones    1996ish   History of radiation therapy 11/16/16- 01/01/17   Base of Tongue/ 66 gy in 33 fractions/ Dose: 2 Gy   Hypertension    Iron deficiency anemia 12/11/2018   Migraine    none for 5 years (as of 01/13/16)   OSA (obstructive sleep apnea) 10/13/2015   unable to get cpap, plans to get one in 2018   Pneumonia    Restless legs    Scoliosis    Shingles 08/28/2013   Shortness of breath    with exertion   Sleep apnea    Tongue cancer ([facility])    tongue cancer   Allergies  Allergen Reactions   Elemental Sulfur Hives and Swelling   Other Hives and Swelling    tomato   Sulfa Antibiotics Hives   Social History   Socioeconomic History   Marital  status: Legally Separated    Spouse name: Not on file   Number of children: 3   Years of education: 12   Highest education level: Not on file  Occupational History   Occupation: Air cabin crew  Tobacco Use   Smoking status: Never   Smokeless tobacco: Never  Vaping Use   Vaping Use: Never used  Substance and Sexual Activity   Alcohol use: Yes    Alcohol/week: 7.0 standard drinks    Types: 7 Cans of beer per  week   Drug use: No   Sexual activity: Yes    Birth control/protection: Surgical  Other Topics Concern   Not on file  Social History Narrative   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[employer]    Separated, 3 children   Never smoker no drug use occasional alcohol averaging about 7 drinks a week   fun: Play with her grandkids, read   Social Determinants of Health   Financial Resource Strain: Not on file  Food Insecurity: Not on file  Transportation Needs: Not on file  Physical Activity: Not on file  Stress: Not on file  Social Connections: Not on file   Vitals:   01/13/21 0729  BP: 102/70  Pulse: 82  Resp: 16  SpO2: 97%   Body mass index is 23.69 kg/m.  Physical Exam Vitals and nursing note reviewed.  Constitutional:      General: She is not in acute distress.    Appearance: She is well-developed and normal weight.  HENT:     Head: Normocephalic and atraumatic.     Right Ear: Tympanic membrane, ear canal and external ear normal.     Left Ear: Tympanic membrane, ear canal and external ear normal.     Nose: Septal deviation and rhinorrhea present.     Right Turbinates: Enlarged.     Left Turbinates: Enlarged.     Mouth/Throat:     Mouth: Mucous membranes are moist.     Pharynx: No oropharyngeal exudate or posterior oropharyngeal erythema.  Eyes:     Conjunctiva/sclera: Conjunctivae normal.  Cardiovascular:     Rate and Rhythm: Normal rate and regular rhythm.  Pulmonary:     Effort: Pulmonary effort is normal. No respiratory distress.     Breath  sounds: Normal breath sounds. Decreased air movement (Mild hypoventilation right base.) and transmitted upper airway sounds present. No stridor. No wheezing, rhonchi or rales.     Comments: A couple episodes of productive cough during visit. Abdominal:     Palpations: Abdomen is soft. There is no mass.     Tenderness: There is no abdominal tenderness.  Musculoskeletal:     Thoracic back: Tenderness present.     Lumbar back: Tenderness present.     Comments: Upper and lower back pain with light touch, bilateral.  Lymphadenopathy:     Cervical: No cervical adenopathy.  Skin:    General: Skin is warm.     Findings: No erythema.  Neurological:     Mental Status: She is alert and oriented to person, place, and time.   ASSESSMENT AND PLAN:  Kristen [name] was seen today for urinary tract infection and cough.  Diagnoses and all orders for this visit: Orders Placed This Encounter  Procedures   Culture, Urine   DG Chest 2 View   POCT urinalysis dipstick   POC Influenza A&B(BINAX/QUICKVUE)   Urinary frequency -     POCT urinalysis dipstick -     Culture, Urine; Future  Urinary tract infection without hematuria, site unspecified Urine dipstick with a few abnormalities: 3+ leukocytes and positive for protein on nitrite. Empiric abx treatment started today and will be tailored according to Ucx results and susceptibility report. Recommend quinolone based on last Ucx and to cover for possible pneumonia. We discussed side effects including risk of neuropathy and achilles tendon rupture. Clearly instructed about warning signs. F/U if symptoms persist.  -     levofloxacin (LEVAQUIN) 500 MG tablet; Take 1 tablet (500 mg total) by mouth  daily for 5 days.  Acute cough Plain mucinex and Benzonatate 100-200 mg bid prn recommended for symptomatic treatment. Further recommendations according to CXR result.  -     POC Influenza A&B(BINAX/QUICKVUE)  Respiratory tract infection Rapid flu test  negative. Reporting negative COVID 19 home testing. No sick contact. Because right base hypoventilation and risk for respiratory complications, covered with abx.Explained that could be viral ,in which case abx will not help. Clearly instructed about warning sings. Monitor for new symptoms. F/U in a week, before if needed.  -     levofloxacin (LEVAQUIN) 500 MG tablet; Take 1 tablet (500 mg total) by mouth daily for 5 days.  Back pain, unspecified back location, unspecified back pain laterality, unspecified chronicity This seems to be chronic problem. I do not think imaging is needed today. Continue following with PCP.  Return in 1 week (on 01/20/2021), or if symptoms worsen or fail to improve, for cough with PCP.  Keshawn Sundberg G. [place], MD  [facility]. [place] office.

## 2021-01-15 LAB — URINE CULTURE
MICRO NUMBER:: 12799496
SPECIMEN QUALITY:: ADEQUATE

## 2021-01-20 ENCOUNTER — Encounter (HOSPITAL_COMMUNITY): Payer: Self-pay | Admitting: Orthopedic Surgery

## 2021-01-20 ENCOUNTER — Other Ambulatory Visit: Payer: Self-pay

## 2021-01-20 NOTE — Anesthesia Preprocedure Evaluation (Addendum)
Anesthesia Evaluation  Patient identified by MRN, date of birth, ID band Patient awake    Reviewed: Allergy & Precautions, NPO status , Patient's Chart, lab work & pertinent test results  Airway Mallampati: II  TM Distance: >3 FB     Dental   Pulmonary shortness of breath, sleep apnea , pneumonia,    breath sounds clear to auscultation       Cardiovascular hypertension,  Rhythm:Regular Rate:Normal     Neuro/Psych  Headaches,  Neuromuscular disease    GI/Hepatic Neg liver ROS, hiatal hernia, GERD  ,  Endo/Other  negative endocrine ROS  Renal/GU negative Renal ROS     Musculoskeletal   Abdominal   Peds  Hematology   Anesthesia Other Findings   Reproductive/Obstetrics                            Anesthesia Physical Anesthesia Plan  ASA: 3  Anesthesia Plan: General   Post-op Pain Management:    Induction: Intravenous  PONV Risk Score and Plan: 3 and Ondansetron, Dexamethasone and Midazolam  Airway Management Planned: LMA  Additional Equipment:   Intra-op Plan:   Post-operative Plan: Extubation in OR  Informed Consent: I have reviewed the patients History and Physical, chart, labs and discussed the procedure including the risks, benefits and alternatives for the proposed anesthesia with the patient or authorized representative who has indicated his/her understanding and acceptance.     Dental advisory given  Plan Discussed with: CRNA  Anesthesia Plan Comments: (:PAT note by Karoline Caldwell, PA-C She was found to have a left tongue / tongue base tumor on an MRI in spring 2018 (done for headaches). Biopsies showed adenoid cystic carcinoma. She underwent resection of base of tongue cancer and left selective neck dissection (levels 1 and 2) on 09/27/16, along with Gtube placement (for meds only). She then underwent RT, completed January 01, 2017. She briefly had a tracheostomy tube but that  was decannulated not long after surgery. She continues to use a g-tube for medications only because she has some difficulty swallowing pills. Last seen by Dr. Nicolette Bang 11/04/20, no evidence of disease at that time.   Pt had an eval of chest pain Jan 2018. Per cardiology notes, the discomfort was similar to her usual GERD symptoms and she had missed several doses of PPI. Enzymes negative. Echo showed normal LV function. She was recommended to continue PPI as prescribed.   Pt will need DOS labs and eval.  EKG 01/21/21: Sinus tachycardia. Rate 104. LAD, consider left anterior fascicular block. Probable left ventricular hypertrophy  TTE 01/23/16: - Left ventricle: The cavity size was normal. Wall thickness was increased in a pattern of moderate LVH. Systolic function was normal. The estimated ejection fraction was in the range of 60% to 65%. Wall motion was normal; there were no regional wall motion abnormalities. Doppler parameters are consistent with abnormal left ventricular relaxation (grade 1 diastolic dysfunction). - Mitral valve: There was mild regurgitation. - Left atrium: The atrium was moderately dilated. )       Anesthesia Quick Evaluation

## 2021-01-20 NOTE — Progress Notes (Signed)
Anesthesia Chart Review: Same day workup  She was found to have a left tongue / tongue base tumor on an MRI in spring 2018 (done for headaches). Biopsies showed adenoid cystic carcinoma. She underwent resection of base of tongue cancer and left selective neck dissection (levels 1 and 2) on 09/27/16, along with Gtube placement (for meds only). She then underwent RT, completed January 01, 2017. She briefly had a tracheostomy tube but that was decannulated not long after surgery. She continues to use a g-tube for medications only because she has some difficulty swallowing pills. Last seen by Dr. Nicolette Bang 11/04/20, no evidence of disease at that time.   Pt had an eval of chest pain Jan 2018. Per cardiology notes, the discomfort was similar to her usual GERD symptoms and she had missed several doses of PPI. Enzymes negative. Echo showed normal LV function. She was recommended to continue PPI as prescribed.   Pt will need DOS labs and eval.  EKG 01/21/21: Sinus tachycardia. Rate 104. LAD, consider left anterior fascicular block. Probable left ventricular hypertrophy  TTE 01/23/16: - Left ventricle: The cavity size was normal. Wall thickness was   increased in a pattern of moderate LVH. Systolic function was   normal. The estimated ejection fraction was in the range of 60%   to 65%. Wall motion was normal; there were no regional wall   motion abnormalities. Doppler parameters are consistent with   abnormal left ventricular relaxation (grade 1 diastolic   dysfunction). - Mitral valve: There was mild regurgitation. - Left atrium: The atrium was moderately dilated.   Wynonia Musty [facility] Short Stay Center/Anesthesiology Phone [phone] 01/20/2021 10:43 AM

## 2021-01-20 NOTE — Progress Notes (Signed)
Spoke with pt for pre-op call. Pt denies cardiac history or Diabetes. Pt has had throat and esophageal cancer and has a feeding tube. She takes all her medications via feeding with water. Instructed pt to take her medications morning of surgery by 11:45 AM. Pt instructed that she cannot have food after midnight tonight, but may have clear liquids until 11:45 AM.   Chart sent to Anesthesia PA for abnormal EKG.

## 2021-01-21 ENCOUNTER — Other Ambulatory Visit: Payer: Self-pay

## 2021-01-21 ENCOUNTER — Ambulatory Visit (HOSPITAL_COMMUNITY)
Admission: RE | Admit: 2021-01-21 | Discharge: 2021-01-21 | Disposition: A | Discharge status: Home / Self Care | Payer: Medicare PPO | Attending: Orthopedic Surgery | Admitting: Orthopedic Surgery

## 2021-01-21 ENCOUNTER — Encounter (HOSPITAL_COMMUNITY): Payer: Self-pay | Admitting: Orthopedic Surgery

## 2021-01-21 ENCOUNTER — Encounter (HOSPITAL_COMMUNITY)
Admission: RE | Disposition: A | Discharge status: Home / Self Care | Payer: Self-pay | Source: Home / Self Care | Attending: Orthopedic Surgery

## 2021-01-21 ENCOUNTER — Ambulatory Visit (HOSPITAL_COMMUNITY): Payer: Medicare PPO | Admitting: Physician Assistant

## 2021-01-21 DIAGNOSIS — G709 Myoneural disorder, unspecified: Secondary | ICD-10-CM | POA: Insufficient documentation

## 2021-01-21 DIAGNOSIS — Z791 Long term (current) use of non-steroidal anti-inflammatories (NSAID): Secondary | ICD-10-CM | POA: Insufficient documentation

## 2021-01-21 DIAGNOSIS — I10 Essential (primary) hypertension: Secondary | ICD-10-CM | POA: Insufficient documentation

## 2021-01-21 DIAGNOSIS — M1812 Unilateral primary osteoarthritis of first carpometacarpal joint, left hand: Secondary | ICD-10-CM | POA: Diagnosis present

## 2021-01-21 DIAGNOSIS — G4733 Obstructive sleep apnea (adult) (pediatric): Secondary | ICD-10-CM | POA: Diagnosis not present

## 2021-01-21 DIAGNOSIS — K219 Gastro-esophageal reflux disease without esophagitis: Secondary | ICD-10-CM | POA: Diagnosis not present

## 2021-01-21 HISTORY — PX: CARPOMETACARPEL SUSPENSION PLASTY: SHX5005

## 2021-01-21 LAB — CBC
HCT: 39.2 % (ref 36.0–46.0)
Hemoglobin: 12 g/dL (ref 12.0–15.0)
MCH: 27.6 pg (ref 26.0–34.0)
MCHC: 30.6 g/dL (ref 30.0–36.0)
MCV: 90.1 fL (ref 80.0–100.0)
Platelets: 372 10*3/uL (ref 150–400)
RBC: 4.35 MIL/uL (ref 3.87–5.11)
RDW: 14 % (ref 11.5–15.5)
WBC: 3.7 10*3/uL — ABNORMAL LOW (ref 4.0–10.5)
nRBC: 0 % (ref 0.0–0.2)

## 2021-01-21 LAB — BASIC METABOLIC PANEL
Anion gap: 8 (ref 5–15)
BUN: 7 mg/dL (ref 6–20)
CO2: 26 mmol/L (ref 22–32)
Calcium: 9.4 mg/dL (ref 8.9–10.3)
Chloride: 107 mmol/L (ref 98–111)
Creatinine, Ser: 0.69 mg/dL (ref 0.44–1.00)
GFR, Estimated: >60 mL/min (ref >60)
Glucose, Bld: 78 mg/dL (ref 70–99)
Potassium: 3.5 mmol/L (ref 3.5–5.1)
Sodium: 141 mmol/L (ref 135–145)

## 2021-01-21 SURGERY — CARPOMETACARPEL (CMC) SUSPENSION PLASTY
Anesthesia: General | Site: Thumb | Laterality: Left

## 2021-01-21 MED ORDER — DEXAMETHASONE SODIUM PHOSPHATE 10 MG/ML IJ SOLN
INTRAMUSCULAR | Status: DC | PRN
  Administered 2021-01-21: 8 mg via INTRAVENOUS

## 2021-01-21 MED ORDER — EPHEDRINE SULFATE-NACL 50-0.9 MG/10ML-% IV SOSY
PREFILLED_SYRINGE | INTRAVENOUS | Status: DC | PRN
Start: 2021-01-21 — End: 2021-01-21
  Administered 2021-01-21 (×6): 5 mg via INTRAVENOUS

## 2021-01-21 MED ORDER — 0.9 % SODIUM CHLORIDE (POUR BTL) OPTIME
TOPICAL | Status: DC | PRN
  Administered 2021-01-21: 300 mL

## 2021-01-21 MED ORDER — LIDOCAINE 2% (20 MG/ML) 5 ML SYRINGE
INTRAMUSCULAR | Status: AC
  Filled 2021-01-21: qty 5

## 2021-01-21 MED ORDER — ONDANSETRON HCL 4 MG/2ML IJ SOLN
INTRAMUSCULAR | Status: DC | PRN
Start: 2021-01-21 — End: 2021-01-21
  Administered 2021-01-21: 4 mg via INTRAVENOUS

## 2021-01-21 MED ORDER — FENTANYL CITRATE (PF) 250 MCG/5ML IJ SOLN
INTRAMUSCULAR | Status: AC
  Filled 2021-01-21: qty 5

## 2021-01-21 MED ORDER — OXYCODONE HCL 5 MG/5ML PO SOLN
ORAL | Status: AC
  Administered 2021-01-21: 5 mg
  Filled 2021-01-21: qty 5

## 2021-01-21 MED ORDER — LIDOCAINE 2% (20 MG/ML) 5 ML SYRINGE
INTRAMUSCULAR | Status: DC | PRN
  Administered 2021-01-21: 60 mg via INTRAVENOUS

## 2021-01-21 MED ORDER — MIDAZOLAM HCL 2 MG/2ML IJ SOLN
INTRAMUSCULAR | Status: AC
  Filled 2021-01-21: qty 2

## 2021-01-21 MED ORDER — OXYCODONE HCL 5 MG PO TABS: 5.0000 mg | ORAL_TABLET | ORAL | 0 refills | Status: DC | PRN

## 2021-01-21 MED ORDER — PROPOFOL 10 MG/ML IV BOLUS
INTRAVENOUS | Status: AC
  Filled 2021-01-21: qty 20

## 2021-01-21 MED ORDER — MIDAZOLAM HCL 2 MG/2ML IJ SOLN
INTRAMUSCULAR | Status: DC | PRN
  Administered 2021-01-21: 1 mg via INTRAVENOUS

## 2021-01-21 MED ORDER — BUPIVACAINE HCL (PF) 0.25 % IJ SOLN
INTRAMUSCULAR | Status: AC
  Filled 2021-01-21: qty 30

## 2021-01-21 MED ORDER — BUPIVACAINE HCL (PF) 0.25 % IJ SOLN
INTRAMUSCULAR | Status: DC | PRN
  Administered 2021-01-21: 7 mL

## 2021-01-21 MED ORDER — FENTANYL CITRATE (PF) 250 MCG/5ML IJ SOLN
INTRAMUSCULAR | Status: DC | PRN
  Administered 2021-01-21: 50 ug via INTRAVENOUS
  Administered 2021-01-21: 25 ug via INTRAVENOUS
  Administered 2021-01-21 (×2): 50 ug via INTRAVENOUS

## 2021-01-21 MED ORDER — ORAL CARE MOUTH RINSE: 15.0000 mL | Freq: Once | OROMUCOSAL | Status: AC

## 2021-01-21 MED ORDER — ONDANSETRON HCL 4 MG/2ML IJ SOLN
INTRAMUSCULAR | Status: AC
  Filled 2021-01-21: qty 2

## 2021-01-21 MED ORDER — CEFAZOLIN SODIUM-DEXTROSE 2-4 GM/100ML-% IV SOLN
2.0000 g | INTRAVENOUS | Status: AC
  Administered 2021-01-21: 2 g via INTRAVENOUS
  Filled 2021-01-21: qty 100

## 2021-01-21 MED ORDER — CHLORHEXIDINE GLUCONATE 0.12 % MT SOLN
15.0000 mL | Freq: Once | OROMUCOSAL | Status: AC
  Administered 2021-01-21: 15 mL via OROMUCOSAL
  Filled 2021-01-21: qty 15

## 2021-01-21 MED ORDER — LACTATED RINGERS IV SOLN: INTRAVENOUS | Status: DC

## 2021-01-21 MED ORDER — PROPOFOL 10 MG/ML IV BOLUS
INTRAVENOUS | Status: DC | PRN
  Administered 2021-01-21: 150 mg via INTRAVENOUS

## 2021-01-21 MED ORDER — HYDROMORPHONE HCL 1 MG/ML IJ SOLN: 0.2500 mg | INTRAMUSCULAR | Status: DC | PRN

## 2021-01-21 MED ORDER — OXYCODONE HCL 5 MG/5ML PO SOLN: 5.0000 mg | ORAL | 0 refills | Status: AC | PRN

## 2021-01-21 MED ORDER — DEXAMETHASONE SODIUM PHOSPHATE 10 MG/ML IJ SOLN
INTRAMUSCULAR | Status: AC
  Filled 2021-01-21: qty 1

## 2021-01-21 SURGICAL SUPPLY — 77 items
APL PRP STRL LF DISP 70% ISPRP (MISCELLANEOUS) ×1
BAG COUNTER SPONGE SURGICOUNT (BAG) ×3 IMPLANT
BAG SPNG CNTER NS LX DISP (BAG) ×1
BLADE SURG 15 STRL LF DISP TIS (BLADE) ×4 IMPLANT
BLADE SURG 15 STRL SS (BLADE) ×2
BNDG CMPR 9X4 STRL LF SNTH (GAUZE/BANDAGES/DRESSINGS) ×1
BNDG CONFORM 2 STRL LF (GAUZE/BANDAGES/DRESSINGS) IMPLANT
BNDG ELASTIC 2X5.8 VLCR STR LF (GAUZE/BANDAGES/DRESSINGS) ×2 IMPLANT
BNDG ELASTIC 3X5.8 VLCR STR LF (GAUZE/BANDAGES/DRESSINGS) ×2 IMPLANT
BNDG ELASTIC 4X5.8 VLCR STR LF (GAUZE/BANDAGES/DRESSINGS) ×5 IMPLANT
BNDG ESMARK 4X9 LF (GAUZE/BANDAGES/DRESSINGS) ×3 IMPLANT
BNDG GAUZE ELAST 4 BULKY (GAUZE/BANDAGES/DRESSINGS) ×7 IMPLANT
CATH ROBINSON RED A/P 10FR (CATHETERS) IMPLANT
CHLORAPREP W/TINT 26 (MISCELLANEOUS) ×3 IMPLANT
CORD BIPOLAR FORCEPS 12FT (ELECTRODE) ×3 IMPLANT
COVER BACK TABLE 60X90IN (DRAPES) ×3 IMPLANT
COVER MAYO STAND STRL (DRAPES) ×2 IMPLANT
COVER SURGICAL LIGHT HANDLE (MISCELLANEOUS) ×3 IMPLANT
CUFF TOURN SGL QUICK 18X4 (TOURNIQUET CUFF) ×3 IMPLANT
CUFF TOURN SGL QUICK 24 (TOURNIQUET CUFF)
CUFF TRNQT CYL 24X4X16.5-23 (TOURNIQUET CUFF) IMPLANT
DECANTER SPIKE VIAL GLASS SM (MISCELLANEOUS) ×2 IMPLANT
DRAIN PENROSE 18X1/4 LTX STRL (DRAIN) ×2 IMPLANT
DRAPE EXTREMITY T 121X128X90 (DISPOSABLE) ×2 IMPLANT
DRAPE HALF SHEET 40X57 (DRAPES) ×3 IMPLANT
DRAPE OEC MINIVIEW 54X84 (DRAPES) ×1 IMPLANT
DRAPE SURG 17X23 STRL (DRAPES) ×2 IMPLANT
DRSG ADAPTIC 3X8 NADH LF (GAUZE/BANDAGES/DRESSINGS) ×2 IMPLANT
GAUZE SPONGE 4X4 12PLY STRL (GAUZE/BANDAGES/DRESSINGS) ×3 IMPLANT
GAUZE XEROFORM 1X8 LF (GAUZE/BANDAGES/DRESSINGS) ×3 IMPLANT
GLOVE SURG ENC TEXT LTX SZ7 (GLOVE) ×3 IMPLANT
GLOVE SURG UNDER POLY LF SZ7 (GLOVE) ×3 IMPLANT
GOWN STRL REUS W/ TWL LRG LVL3 (GOWN DISPOSABLE) ×4 IMPLANT
GOWN STRL REUS W/ TWL XL LVL3 (GOWN DISPOSABLE) ×4 IMPLANT
GOWN STRL REUS W/TWL LRG LVL3 (GOWN DISPOSABLE) ×4
GOWN STRL REUS W/TWL XL LVL3 (GOWN DISPOSABLE)
KIT BASIN OR (CUSTOM PROCEDURE TRAY) ×3 IMPLANT
KIT TURNOVER KIT B (KITS) ×3 IMPLANT
LOOP VESSEL MAXI BLUE (MISCELLANEOUS) IMPLANT
MANIFOLD NEPTUNE II (INSTRUMENTS) ×2 IMPLANT
NDL HYPO 25GX1X1/2 BEV (NEEDLE) IMPLANT
NDL HYPO 25X1 1.5 SAFETY (NEEDLE) ×2 IMPLANT
NDL KEITH (NEEDLE) IMPLANT
NEEDLE HYPO 25GX1X1/2 BEV (NEEDLE) IMPLANT
NEEDLE HYPO 25X1 1.5 SAFETY (NEEDLE) IMPLANT
NEEDLE KEITH (NEEDLE) IMPLANT
NS IRRIG 1000ML POUR BTL (IV SOLUTION) ×3 IMPLANT
PACK ORTHO EXTREMITY (CUSTOM PROCEDURE TRAY) ×3 IMPLANT
PAD ABD 8X10 STRL (GAUZE/BANDAGES/DRESSINGS) ×2 IMPLANT
PAD ARMBOARD 7.5X6 YLW CONV (MISCELLANEOUS) ×3 IMPLANT
PAD CAST 4YDX4 CTTN HI CHSV (CAST SUPPLIES) ×4 IMPLANT
PADDING CAST ABS 3INX4YD NS (CAST SUPPLIES)
PADDING CAST ABS 4INX4YD NS (CAST SUPPLIES) ×1
PADDING CAST ABS COTTON 3X4 (CAST SUPPLIES) ×2 IMPLANT
PADDING CAST ABS COTTON 4X4 ST (CAST SUPPLIES) IMPLANT
PADDING CAST COTTON 4X4 STRL (CAST SUPPLIES) ×2
SET CYSTO W/LG BORE CLAMP LF (SET/KITS/TRAYS/PACK) ×2 IMPLANT
SOL PREP POV-IOD 4OZ 10% (MISCELLANEOUS) ×4 IMPLANT
SPLINT FIBERGLASS 4X30 (CAST SUPPLIES) ×1 IMPLANT
SPLINT PLASTER CAST XFAST 3X15 (CAST SUPPLIES) ×2 IMPLANT
SPLINT PLASTER XTRA FASTSET 3X (CAST SUPPLIES)
SPONGE T-LAP 4X18 ~~LOC~~+RFID (SPONGE) ×2 IMPLANT
SUT ETHIBOND 3-0 V-5 (SUTURE) ×2 IMPLANT
SUT ETHILON 3 0 PS 1 (SUTURE) ×1 IMPLANT
SUT ETHILON 4 0 CL P 3 (SUTURE) ×1 IMPLANT
SUT ETHILON 4 0 PS 2 18 (SUTURE) ×8 IMPLANT
SUT FIBERWIRE 3-0 18 TAPR NDL (SUTURE)
SUTURE FIBERWR 3-0 18 TAPR NDL (SUTURE) ×2 IMPLANT
SWAB CULTURE ESWAB REG 1ML (MISCELLANEOUS) IMPLANT
SYR BULB EAR ULCER 3OZ GRN STR (SYRINGE) ×2 IMPLANT
SYR CONTROL 10ML LL (SYRINGE) IMPLANT
TOWEL GREEN STERILE (TOWEL DISPOSABLE) ×3 IMPLANT
TOWEL GREEN STERILE FF (TOWEL DISPOSABLE) ×5 IMPLANT
TUBE CONNECTING 12X1/4 (SUCTIONS) ×2 IMPLANT
UNDERPAD 30X36 HEAVY ABSORB (UNDERPADS AND DIAPERS) ×3 IMPLANT
WATER STERILE IRR 1000ML POUR (IV SOLUTION) ×2 IMPLANT
YANKAUER SUCT BULB TIP NO VENT (SUCTIONS) ×2 IMPLANT

## 2021-01-21 NOTE — Anesthesia Procedure Notes (Signed)
Procedure Name: LMA Insertion Date/Time: 01/21/2021 3:39 PM Performed by: Mariea Clonts, CRNA Pre-anesthesia Checklist: Patient identified, Emergency Drugs available, Suction available and Patient being monitored Patient Re-evaluated:Patient Re-evaluated prior to induction Oxygen Delivery Method: Circle System Utilized Preoxygenation: Pre-oxygenation with 100% oxygen Induction Type: IV induction Ventilation: Mask ventilation without difficulty LMA: LMA inserted LMA Size: 4.0 Number of attempts: 1 Airway Equipment and Method: Bite block Placement Confirmation: positive ETCO2 Tube secured with: Tape Dental Injury: Teeth and Oropharynx as per pre-operative assessment

## 2021-01-21 NOTE — Discharge Instructions (Signed)
Audria Nine, M.D. Hand Surgery  POST-OPERATIVE DISCHARGE INSTRUCTIONS   PRESCRIPTIONS: You have been given a prescription to be taken as directed for post-operative pain control.  You may also take over the counter ibuprofen/aleve and tylenol for pain. Take this as directed on the packaging. Do not exceed 3000 mg tylenol/acetaminophen in 24 hours.  Ibuprofen 600-800 mg (3-4) tablets by mouth every 6 hours as needed for pain.  OR Aleve 2 tablets by mouth every 12 hours (twice daily) as needed for pain.  AND/OR Tylenol 1000 mg (2 tablets) every 8 hours as needed for pain.  Please use your pain medication carefully, as refills are limited and you may not be provided with one.  As stated above, please use over the counter pain medicine - it will also be helpful with decreasing your swelling.    ANESTHESIA: After your surgery, post-surgical discomfort or pain is likely. This discomfort can last several days to a few weeks. At certain times of the day your discomfort may be more intense.   Did you receive a nerve block?  A nerve block can provide pain relief for one hour to two days after your surgery. As long as the nerve block is working, you will experience little or no sensation in the area the surgeon operated on.  As the nerve block wears off, you will begin to experience pain or discomfort. It is very important that you begin taking your prescribed pain medication before the nerve block fully wears off. Treating your pain at the first sign of the block wearing off will ensure your pain is better controlled and more tolerable when full-sensation returns. Do not wait until the pain is intolerable, as the medicine will be less effective. It is better to treat pain in advance than to try and catch up.   General Anesthesia:  If you did not receive a nerve block during your surgery, you will need to start taking your pain medication shortly after your surgery and should continue to do  so as prescribed by your surgeon.     ICE AND ELEVATION: You may use ice for the first 48-72 hours, but it is not critical.   Motion of your fingers is very important s to decrease the swelling. Elevation, as much as possible for the next 48 hours, is critical for decreasing swelling as well as for pain relief. Elevation means when you are seated or lying down, you hand should be at or above your heart. When walking, the hand needs to be at or above the level of your elbow.  If the bandage gets too tight, it may need to be loosened. Please contact our office and we will instruct you in how to do this.    SURGICAL BANDAGES:  Keep your dressing and/or splint clean and dry at all times.  Do not remove until you are seen again in the office.  If careful, you may place a plastic bag over your bandage and tape the end to shower, but be careful, do not get your bandages wet.     HAND THERAPY:  You may not need any. If you do, we will begin this at your follow up visit in the clinic.    ACTIVITY AND WORK: You are encouraged to move any fingers which are not in the bandage. Attached is an instruction sheet on finger motion. Do this as much as you need to make your fingers move fully and keep the swelling down.  Light  use of the fingers is allowed to assist the other hand with daily hygiene and eating, but strong gripping or lifting is often uncomfortable and should be avoided.  You might miss a variable period of time from work and hopefully this issue has been discussed prior to surgery. You may not do any heavy work with your affected hand for about 2 weeks.    [facility] [address] [place],  [place]  [address] [phone]

## 2021-01-21 NOTE — Brief Op Note (Signed)
01/21/2021  5:41 PM  PATIENT:  Kristen [name]  56 y.o. female  PRE-OPERATIVE DIAGNOSIS:  Left Thumb Carpometacarpal Arthritis  POST-OPERATIVE DIAGNOSIS:  Left Thumb Carpometacarpal Arthritis  PROCEDURE:  Procedure(s): Left Thumb Ligament Carpometacarpal Arthroplasty (Left)  SURGEON:  Surgeon(s) and Role:    * Sherilyn Cooter, MD - Primary  PHYSICIAN ASSISTANT:   ASSISTANTS: none   ANESTHESIA:   general  EBL:  5 mL   BLOOD ADMINISTERED:none  DRAINS: none   LOCAL MEDICATIONS USED:  MARCAINE     SPECIMEN:  No Specimen  DISPOSITION OF SPECIMEN:  N/A  COUNTS:  YES  TOURNIQUET:   Total Tourniquet Time Documented: Upper Arm (Left) - 80 minutes Total: Upper Arm (Left) - 80 minutes   DICTATION: .Viviann Spare Dictation  PLAN OF CARE: Discharge to home after PACU  PATIENT DISPOSITION:  PACU - hemodynamically stable.   Delay start of Pharmacological VTE agent (>24hrs) due to surgical blood loss or risk of bleeding: not applicable

## 2021-01-21 NOTE — Transfer of Care (Signed)
Immediate Anesthesia Transfer of Care Note  Patient: Kristen [name]  Procedure(s) Performed: Left Thumb Ligament Carpometacarpal Arthroplasty (Left: Thumb)  Patient Location: PACU  Anesthesia Type:General  Level of Consciousness: sedated  Airway & Oxygen Therapy: Patient Spontanous Breathing  Post-op Assessment: Report given to RN and Post -op Vital signs reviewed and stable  Post vital signs: Reviewed and stable  Last Vitals:  Vitals Value Taken Time  BP 113/79 01/21/21 1745  Temp    Pulse 78 01/21/21 1750  Resp 14 01/21/21 1750  SpO2 98 % 01/21/21 1750  Vitals shown include unvalidated device data.  Last Pain:  Vitals:   01/21/21 1248  TempSrc:   PainSc: 2          Complications: No notable events documented.

## 2021-01-21 NOTE — Anesthesia Postprocedure Evaluation (Signed)
Anesthesia Post Note  Patient: Kristen [name]  Procedure(s) Performed: Left Thumb Ligament Carpometacarpal Arthroplasty (Left: Thumb)     Patient location during evaluation: PACU Anesthesia Type: General Level of consciousness: awake and alert Pain management: pain level controlled Vital Signs Assessment: post-procedure vital signs reviewed and stable Respiratory status: spontaneous breathing, nonlabored ventilation and respiratory function stable Cardiovascular status: stable and blood pressure returned to baseline Anesthetic complications: no   No notable events documented.  Last Vitals:  Vitals:   01/21/21 1800 01/21/21 1830  BP: 116/78 110/78  Pulse: 82 80  Resp: 15 12  Temp:  36.4 C  SpO2: 98% 98%    Last Pain:  Vitals:   01/21/21 1830  TempSrc:   PainSc: [place]

## 2021-01-21 NOTE — Interval H&P Note (Signed)
History and Physical Interval Note:  01/21/2021 12:56 PM  Kristen [name]  has presented today for surgery, with the diagnosis of Left Thumb Carpometacarpal Arthritis.  The various methods of treatment have been discussed with the patient and family. After consideration of risks, benefits and other options for treatment, the patient has consented to  Procedure(s): Left Thumb Ligament Reconstruction Tendon Interposition (Left) as a surgical intervention.  The patient's history has been reviewed, patient examined, no change in status, stable for surgery.  I have reviewed the patient's chart and labs.  Questions were answered to the patient's satisfaction.      Lindy Pennisi

## 2021-01-22 ENCOUNTER — Encounter (HOSPITAL_COMMUNITY): Payer: Self-pay | Admitting: Orthopedic Surgery

## 2021-01-22 NOTE — Op Note (Signed)
Date of Surgery: 01/22/2021  INDICATIONS: Ms. Kristen [name] is a 56 y.o.-year-old female with left thumb CMC osteoarthritis that has failed conservative management.  Risks, benefits, and alternatives to surgery were again discussed with the patient wishing to proceed with surgery.  Informed consent was signed after our discussion.   PREOPERATIVE DIAGNOSIS: 1. Left thumb CMC OA  POSTOPERATIVE DIAGNOSIS: Same.  PROCEDURE:  Left thumb CMC interposition arthroplasty Left thumb FCR tendon transfer   SURGEON: Audria Nine, M.D.  ASSIST:   ANESTHESIA:  general  IV FLUIDS AND URINE: See anesthesia.  ESTIMATED BLOOD LOSS: 5 mL.  IMPLANTS: * No implants in log *   DRAINS: None  COMPLICATIONS: see description of procedure.  DESCRIPTION OF PROCEDURE: The patient was met in the preoperative holding area where the surgical site was marked and the consent form was verified.  The patient was then taken to the operating room and transferred to the operating table.  All bony prominences were well padded.  A tourniquet was applied to the left upper arm.  The operative extremity was prepped and draped in the usual and sterile fashion.  A formal time-out was performed to confirm that this was the correct patient, surgery, side, and site.   Following timeout, the limb was exsanguinated with an Esmarch bandage and the tourniquet inflated to a 50 mmHg.  A longitudinal incision was made at the dorsal aspect of the thumb centered over the carpometacarpal joint.  The skin and subcutaneous tissue was divided.  Branches of the dorsal superficial radial nerve were identified and protected.  The interval between the EPB and APL tendons was identified and incised.  Blunt dissection was used to identify the branch of the radial artery.  Care was taken to protect this vessel throughout the course of the procedure.  An incision was made in the capsule of the carpometacarpal joint.  Full-thickness capsular flaps were  elevated to allow for closure at the end of the procedure.  The capsular and ligamentous tissue was sharply excised off of its attachment on the trapezium circumferentially.  Fluoroscopy was brought in to confirm dissection was occurring around the trapezium.  The trapezium was then removed in a piecemeal fashion.  The FCR tendon was identified deep in the wound was found to be intact and healthy-appearing.  A Valora Corporal was used to evaluate the articulation between the scaphoid and trapezoid which was healthy-appearing and free from degenerative changes.  A 4 mm drill was used to make a hole in the dorsal aspect of the thumb metacarpal in line with the nail approximately 1 cm distal to the joint line.  The drill was aimed towards the volar aspect of the articular surface well for recreation of the volar oblique ligament.  The bone tunnel was thoroughly irrigated.  Attention was then towards towards harvesting the FCR tendon.  2 incisions were made at the volar aspect of the forearm over the FCR tendon.  Blunt dissection was used to identify the tendon.  Traction on the tendon produced radial wrist flexion confirming this was the correct tendon.  The tendon was divided sharply proximally and then delivered at the level of the wrist and then into the trapezial space.  The FCR tendon was quite large and was trimmed so as to fit through the radial tunnel.  There was then passed through the tunnel and the metacarpal.  There is then looped around itself.  The thumb was held with slight traction and the tendon was sutured to itself.  A another double-ended 3-0 Ethibond suture was then sewn in the volar capsular tissue in a figure-of-eight fashion.  The 2 limbs were then passed through the FCR tendon and the coronoid fashion.  This excess tendon was then collapsed into the arthroplasty space and tied to the bone as per the capsule to keep it in place.  This would serve as our interposition material.  The wounds were all then  thoroughly irrigated with copious sterile saline.  The capsule overlying the thumb CMC joint was then closed using the remainder of the 3-0 Ethibond suture.  The tourniquet was let down and the radial artery was found to be pulsatile without any evidence of damage.  Hemostasis was achieved using a combination of bipolar electrocautery and direct pressure.  The skin was then closed using a 4-0 nylon suture in a horizontal mattress fashion.  The wounds in the volar aspect of the forearm were closed in a similar fashion following irrigation with 4-0 nylon sutures.  The wound was then dressed with Xeroform, 4 x 4's, cast padding and a well-padded thumb spica splint.  The patient was then reversed of anesthesia and extubated uneventfully.  She was transferred from the operating table to the postoperative bed.  She was then taken the PACU in stable condition.  All counts were correct at the end of the procedure.   POSTOPERATIVE PLAN: She will be discharged to home with appropriate pain medication and discharge instructions.  I'll see her in two weeks for her first postop visit.   Audria Nine, MD 8:30 PM

## 2021-01-30 ENCOUNTER — Ambulatory Visit (INDEPENDENT_AMBULATORY_CARE_PROVIDER_SITE_OTHER): Payer: Medicare Other | Admitting: Orthopedic Surgery

## 2021-01-30 ENCOUNTER — Other Ambulatory Visit: Payer: Self-pay

## 2021-01-30 ENCOUNTER — Encounter: Payer: Self-pay | Admitting: Orthopedic Surgery

## 2021-01-30 ENCOUNTER — Ambulatory Visit (INDEPENDENT_AMBULATORY_CARE_PROVIDER_SITE_OTHER): Payer: Medicare PPO

## 2021-01-30 DIAGNOSIS — Z9889 Other specified postprocedural states: Secondary | ICD-10-CM

## 2021-01-30 DIAGNOSIS — M1812 Unilateral primary osteoarthritis of first carpometacarpal joint, left hand: Secondary | ICD-10-CM

## 2021-01-30 NOTE — Progress Notes (Signed)
Post-Op Visit Note   Patient: Kristen [name]           Date of Birth: 07-22-1965           MRN: [id number] Visit Date: 01/30/2021 PCP: [name], MD   Assessment & Plan:  Chief Complaint:  Chief Complaint  Patient presents with   Left Thumb - Routine Post Op   Visit Diagnoses:  1. Post-operative state   2. Arthritis of carpometacarpal (CMC) joint of left thumb     Plan: Patient is 9 days out from [facility] arthroplasty with LRTI.  She is doing well postop.  Her incisions are well healed and nylon sutures removed.  She will have a thermoplast thumb spica brace made in the next several weeks and start the rehab process. I can see her back in another two weeks.   Follow-Up Instructions: No follow-ups on file.   Orders:  Orders Placed This Encounter  Procedures   XR Finger Thumb Left   Ambulatory referral to Occupational Therapy   No orders of the defined types were placed in this encounter.   Imaging: No results found.  PMFS History: Patient Active Problem List   Diagnosis Date Noted   Tenosynovitis, de Quervain 12/14/2020   Arthritis of carpometacarpal [facility]) joint of left thumb 12/14/2020   Aortic atherosclerosis ([place]) 08/20/2020   Hyperkalemia 07/24/2020   Alcohol use 08/05/2019   Bilateral lower extremity edema 08/05/2019   Peripheral edema 03/12/2019   Iron deficiency anemia 12/11/2018   Loosening of hardware in spine [facility])    Other spondylosis with radiculopathy, lumbar region    History of lumbar spinal fusion 11/06/2018   UTI (urinary tract infection) 05/15/2017   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 ([place]) 10/29/2016   Malignant neoplasm of base of tongue ([place]) 10/29/2016   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[place]) 10/29/2016   Headache associated with sexual activity 05/14/2016   Tongue lesion 05/14/2016   Thrombocytopenia ([place]) 01/22/2016   Chronic diarrhea 01/22/2016   Chest pain    Essential hypertension    Spinal stenosis of lumbar  region 01/20/2016    Class: Chronic   DDD (degenerative disc disease), lumbar 01/20/2016    Class: Chronic   Spinal stenosis of lumbar region with neurogenic claudication 01/20/2016   Low back pain 12/22/2015   Hypersomnolence 10/13/2015   Muscle cramp 08/01/2015   GERD (gastroesophageal reflux disease) 07/09/2015   Morbid obesity with BMI of 40.0-44.9, adult ([place]) 01/08/2013   Past Medical History:  Diagnosis Date   Allergy    Anemia    Iron deficiency   Anxiety    Arthritis    back, left shoulder   Blood transfusion without reported diagnosis    Chicken pox    Depression    Diarrhea    takes Imodium daily   GERD (gastroesophageal reflux disease)    H/O hiatal hernia    Headache(784.0)    History of kidney stones    1996ish   History of radiation therapy 11/16/16- 01/01/17   Base of Tongue/ 66 gy in 33 fractions/ Dose: 2 Gy   Hypertension    Iron deficiency anemia 12/11/2018   Migraine    none for 5 years (as of 01/13/16)   OSA (obstructive sleep apnea) 10/13/2015   unable to get cpap, plans to get one in 2018   Pneumonia    Restless legs    Scoliosis    Shingles 08/28/2013   Shortness of breath  with exertion   Sleep apnea    Tongue cancer ([place])    tongue cancer    Family History  Problem Relation Age of Onset   Heart disease Father    Heart attack Father    Hypertension Mother    Arthritis Mother    Diabetes Maternal Grandmother    Diabetes Paternal Grandmother    Diabetes Maternal Uncle    Colon polyps Neg Hx    Colon cancer Neg Hx    Esophageal cancer Neg Hx    Stomach cancer Neg Hx    Rectal cancer Neg Hx     Past Surgical History:  Procedure Laterality Date   BACK SURGERY  07/21/1978   CARPOMETACARPEL SUSPENSION PLASTY Left 01/21/2021   Procedure: Left Thumb Ligament Carpometacarpal Arthroplasty;  Surgeon: Sherilyn Cooter, MD;  Location: [place];  Service: Orthopedics;  Laterality: Left;   CHOLECYSTECTOMY N/A 08/15/2013   Procedure: LAPAROSCOPIC  CHOLECYSTECTOMY WITH INTRAOPERATIVE CHOLANGIOGRAM;  Surgeon: Gwenyth Ober, MD;  Location: [place];  Service: General;  Laterality: N/A;   COLONOSCOPY N/A 01/09/2013   Procedure: COLONOSCOPY;  Surgeon: Beryle Beams, MD;  Location: [place];  Service: Endoscopy;  Laterality: N/A;   COLONOSCOPY  03/2018   DIRECT LARYNGOSCOPY  07/2016   Dr. Nicolette Bang [facility]   ESOPHAGOGASTRODUODENOSCOPY  03/2018   ESOPHAGOGASTRODUODENOSCOPY (EGD) WITH PROPOFOL N/A 04/24/2019   Procedure: ESOPHAGOGASTRODUODENOSCOPY (EGD) WITH PROPOFOL;  Surgeon: Gatha Mayer, MD;  Location: [facility];  Service: Endoscopy;  Laterality: N/A;   GASTROSTOMY TUBE PLACEMENT  09/27/2016   GIVENS CAPSULE STUDY N/A 04/24/2019   Procedure: GIVENS CAPSULE STUDY;  Surgeon: Gatha Mayer, MD;  Location: [facility];  Service: Endoscopy;  Laterality: N/A;   HERNIA REPAIR Left 1981   IR PATIENT EVAL TECH 0-60 MINS  12/13/2016   IR PATIENT EVAL TECH 0-60 MINS  04/11/2017   IR PATIENT EVAL TECH 0-60 MINS  03/24/2018   IR PATIENT EVAL TECH 0-60 MINS  11/23/2018   IR PATIENT EVAL TECH 0-60 MINS  05/28/2019   IR REPLACE G-TUBE SIMPLE WO FLUORO  12/12/2017   IR REPLACE G-TUBE SIMPLE WO FLUORO  03/29/2018   IR REPLACE G-TUBE SIMPLE WO FLUORO  06/29/2018   IR REPLACE G-TUBE SIMPLE WO FLUORO  11/02/2018   IR REPLACE G-TUBE SIMPLE WO FLUORO  03/08/2019   IR REPLACE G-TUBE SIMPLE WO FLUORO  07/02/2019   IR REPLACE G-TUBE SIMPLE WO FLUORO  11/30/2019   IR REPLACE G-TUBE SIMPLE WO FLUORO  12/27/2019   IR REPLACE G-TUBE SIMPLE WO FLUORO  05/27/2020   IR REPLACE G-TUBE SIMPLE WO FLUORO  07/09/2020   MODIFIED RADICAL NECK DISSECTION Left 09/27/2016   Levels 1 & 2   PARTIAL GLOSSECTOMY Left 09/27/2016   Left hemi partial glossectomy   SPINE SURGERY  01/20/2016   fusion   TONSILLECTOMY     tracheotomy  09/27/2016   TUBAL LIGATION  06/1988   Social History   Occupational History   Occupation: Air cabin crew  Tobacco Use   Smoking status: Never    Smokeless tobacco: Never  Vaping Use   Vaping Use: Never used  Substance and Sexual Activity   Alcohol use: Yes    Alcohol/week: 7.0 standard drinks    Types: 7 Cans of beer per week    Comment: occasionally   Drug use: No   Sexual activity: Yes    Birth control/protection: Surgical

## 2021-02-10 ENCOUNTER — Encounter: Payer: Self-pay | Admitting: Specialist

## 2021-02-12 ENCOUNTER — Ambulatory Visit (INDEPENDENT_AMBULATORY_CARE_PROVIDER_SITE_OTHER): Payer: Medicare PPO | Admitting: Occupational Therapy

## 2021-02-12 ENCOUNTER — Other Ambulatory Visit: Payer: Self-pay

## 2021-02-12 ENCOUNTER — Encounter: Payer: Self-pay | Admitting: Occupational Therapy

## 2021-02-12 DIAGNOSIS — M6281 Muscle weakness (generalized): Secondary | ICD-10-CM | POA: Diagnosis not present

## 2021-02-12 DIAGNOSIS — R600 Localized edema: Secondary | ICD-10-CM

## 2021-02-12 DIAGNOSIS — R278 Other lack of coordination: Secondary | ICD-10-CM

## 2021-02-12 DIAGNOSIS — M25642 Stiffness of left hand, not elsewhere classified: Secondary | ICD-10-CM

## 2021-02-12 DIAGNOSIS — M25542 Pain in joints of left hand: Secondary | ICD-10-CM

## 2021-02-12 NOTE — Therapy (Signed)
OUTPATIENT OCCUPATIONAL THERAPY ORTHO EVALUATION  Patient Name: Kristen [name] MRN: [id number] DOB:10/23/1965, 56 y.o., female Today's Date: 02/12/2021  PCP: [name], MD REFERRING PROVIDER: Sherilyn Cooter, MD  Past Medical History:  Diagnosis Date   Allergy    Anemia    Iron deficiency   Anxiety    Arthritis    back, left shoulder   Blood transfusion without reported diagnosis    Chicken pox    Depression    Diarrhea    takes Imodium daily   GERD (gastroesophageal reflux disease)    H/O hiatal hernia    Headache(784.0)    History of kidney stones    1996ish   History of radiation therapy 11/16/16- 01/01/17   Base of Tongue/ 66 gy in 33 fractions/ Dose: 2 Gy   Hypertension    Iron deficiency anemia 12/11/2018   Migraine    none for 5 years (as of 01/13/16)   OSA (obstructive sleep apnea) 10/13/2015   unable to get cpap, plans to get one in 2018   Pneumonia    Restless legs    Scoliosis    Shingles 08/28/2013   Shortness of breath    with exertion   Sleep apnea    Tongue cancer ([place])    tongue cancer   Past Surgical History:  Procedure Laterality Date   BACK SURGERY  07/21/1978   CARPOMETACARPEL SUSPENSION PLASTY Left 01/21/2021   Procedure: Left Thumb Ligament Carpometacarpal Arthroplasty;  Surgeon: Sherilyn Cooter, MD;  Location: [place];  Service: Orthopedics;  Laterality: Left;   CHOLECYSTECTOMY N/A 08/15/2013   Procedure: LAPAROSCOPIC CHOLECYSTECTOMY WITH INTRAOPERATIVE CHOLANGIOGRAM;  Surgeon: Gwenyth Ober, MD;  Location: [place];  Service: General;  Laterality: N/A;   COLONOSCOPY N/A 01/09/2013   Procedure: COLONOSCOPY;  Surgeon: Beryle Beams, MD;  Location: [place];  Service: Endoscopy;  Laterality: N/A;   COLONOSCOPY  03/2018   DIRECT LARYNGOSCOPY  07/2016   Dr. Nicolette Bang [facility]   ESOPHAGOGASTRODUODENOSCOPY  03/2018   ESOPHAGOGASTRODUODENOSCOPY (EGD) WITH PROPOFOL N/A 04/24/2019   Procedure: ESOPHAGOGASTRODUODENOSCOPY (EGD) WITH PROPOFOL;   Surgeon: Gatha Mayer, MD;  [facility];  Service: Endoscopy;  Laterality: N/A;   GASTROSTOMY TUBE PLACEMENT  09/27/2016   GIVENS CAPSULE STUDY N/A 04/24/2019   Procedure: GIVENS CAPSULE STUDY;  Surgeon: Gatha Mayer, MD;  [facility];  Service: Endoscopy;  Laterality: N/A;   HERNIA REPAIR Left 1981   IR PATIENT EVAL TECH 0-60 MINS  12/13/2016   IR PATIENT EVAL TECH 0-60 MINS  04/11/2017   IR PATIENT EVAL TECH 0-60 MINS  03/24/2018   IR PATIENT EVAL TECH 0-60 MINS  11/23/2018   IR PATIENT EVAL TECH 0-60 MINS  05/28/2019   IR REPLACE G-TUBE SIMPLE WO FLUORO  12/12/2017   IR REPLACE G-TUBE SIMPLE WO FLUORO  03/29/2018   IR REPLACE G-TUBE SIMPLE WO FLUORO  06/29/2018   IR REPLACE G-TUBE SIMPLE WO FLUORO  11/02/2018   IR REPLACE G-TUBE SIMPLE WO FLUORO  03/08/2019   IR REPLACE G-TUBE SIMPLE WO FLUORO  07/02/2019   IR REPLACE G-TUBE SIMPLE WO FLUORO  11/30/2019   IR REPLACE G-TUBE SIMPLE WO FLUORO  12/27/2019   IR REPLACE G-TUBE SIMPLE WO FLUORO  05/27/2020   IR REPLACE G-TUBE SIMPLE WO FLUORO  07/09/2020   MODIFIED RADICAL NECK DISSECTION Left 09/27/2016   Levels 1 & 2   PARTIAL GLOSSECTOMY Left 09/27/2016   Left hemi partial glossectomy   SPINE SURGERY  01/20/2016   fusion  TONSILLECTOMY     tracheotomy  09/27/2016   TUBAL LIGATION  06/1988   Patient Active Problem List   Diagnosis Date Noted   Tenosynovitis, de Quervain 12/14/2020   Arthritis of carpometacarpal [facility]) joint of left thumb 12/14/2020   Aortic atherosclerosis ([place]) 08/20/2020   Hyperkalemia 07/24/2020   Alcohol use 08/05/2019   Bilateral lower extremity edema 08/05/2019   Peripheral edema 03/12/2019   Iron deficiency anemia 12/11/2018   Loosening of hardware in spine [facility])    Other spondylosis with radiculopathy, lumbar region    History of lumbar spinal fusion 11/06/2018   UTI (urinary tract infection) 05/15/2017   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   Malignant neoplasm of  base of tongue ([place]) 10/29/2016   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[place]) 10/29/2016   Headache associated with sexual activity 05/14/2016   Tongue lesion 05/14/2016   Thrombocytopenia ([place]) 01/22/2016   Chronic diarrhea 01/22/2016   Chest pain    Essential hypertension    Spinal stenosis of lumbar region 01/20/2016    Class: Chronic   DDD (degenerative disc disease), lumbar 01/20/2016    Class: Chronic   Spinal stenosis of lumbar region with neurogenic claudication 01/20/2016   Low back pain 12/22/2015   Hypersomnolence 10/13/2015   Muscle cramp 08/01/2015   GERD (gastroesophageal reflux disease) 07/09/2015   Morbid obesity with BMI of 40.0-44.9, adult ([place]) 01/08/2013   ONSET DATE: 01/21/2021  REFERRING DIAG: Left thumb CMC OA, pt is s/p Left thumb CMC interposition arthroplasty (Left thumb FCR tendon transfer).  THERAPY DIAG:  OA of CMC joint of left thumb, s/p Left thumb CMC interposition arthroplasty (Left thumb FCR tendon transfer).  SUBJECTIVE:  Patient is 3 weeks s/p left CMC arthroplasty with LRTI.  She presents to out-pt OT for a custom forearm based thumb spica splint and rehab as per [place] Protocol per Dr Tempie Donning. Her DOS: 01/21/21. She plans to f/u with Dr Tempie Donning in about 1 week.       PERTINENT HISTORY: Pt reports that she tried pre-fabricated splinting prior to left [facility] surgery. 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 Pt also has h/o tongue cancer, anxiety and depression. Please refer to chart for complete medical history.  PAIN:  Are you having pain? Yes NPRS scale: 3/10 Pain location: Dorsal thumb Pain orientation: Left  PAIN TYPE: throbbing Pain description: intermittent  Aggravating factors: holding arm down or falling asleep on her left sde Relieving factors: Elevating arm and position changes  PRECAUTIONS: Other: As per [facility] joint soft tissue reconstruction L UE. Follow [place] Protocol  HAND DOMINANCE:  Right  FALLS: Has patient fallen in last 6 months? Yes, Number of falls: 4, her MD is aware per her report.  PLOF: Independent. Pt lives with her mom, brother and two grandsons. Pt does her own bathing, dressing and ADL's.  PATIENT GOALS Get use of left non-dominant hand and thumb back, decreased pain.  OBJECTIVE:   DIAGNOSTIC FINDINGS: Pt scars are well healed. She had sutures removed on 01/30/21 per her report. She has no edema in her left hand or digits as per visual comparison with her right dominant hand.  COGNITION: Overall cognitive status: Within functional limits for tasks assessed  ADLs: Overall ADLs: Pt is overall Mod I with ADL's using her right dominant hand and not the left. She lives with family members that are able to assist her PRN.  SENSATION: Light touch: Appears intact  to light touch.  COORDINATION: 9 Hole Peg test: Right: NT sec; Left: NT sec Box and Blocks: NT Comments: NT, deferred  EDEMA: None noted or observed (Left thumb, wrist or hand.  SKIN INTEGRITY: Scars appear well healed. Pt reports that sutures were removed on 01/30/21.  HAND A/PROM:  A/PROM Right 02/12/2021 Left 02/12/2021  Thumb MCP (0-60) NT NT  Thumb IP (0-80)    Thumb Radial abd/add (0-55)    Thumb Palmar abd/add (0-45)    Thumb opposition to index    Index MCP (0-90) NT WNL  Index PIP (0-100)    Index DIP (0-70)    Long MCP (0-90)  WNL  Long PIP (0-100)    Long DIP (0-70)    Ring MCP (0-90_  WNL  Ring PIP (0-100)    Ring DIP (0-70)    Little MCP (0-90)  WNL  Little PIP (0-100)    Little DIP (0-70)    (Blank rows = not tested)  UE MMT:  MMT Right 02/12/2021 Left 02/12/2021  Shoulder flexion NT NT  Shoulder abduction    Shoulder adduction    Shoulder extension    Shoulder internal rotation    Shoulder external rotation    Middle trapezius    Lower trapezius    Elbow flexion    Elbow extension    Wrist flexion    Wrist extension    Wrist ulnar deviation    Wrist  radial deviation    Wrist pronation    Wrist supination    (Blank rows = not tested)  HAND FUNCTION:  Grip strength: Right: NT lbs; Left: NT lbs Lateral pinch: Right: NT lbs, Left: NT lbs 3 point pinch: Right: NT lbs, Left: NT lbs Comments: Deferred  TODAY'S TREATMENT:  1) Pt was instructed in a home program for scar massage 1-2x/day for 5 min sessions left UE (thumb, volar wrist and forearm). 2) Pt was fitted with a left custom forearm based thumb spica splint that places her thumb midway between palmar and radial abduction secondary to reconstruction. She was educated in splinting use, care and precautions. She was educated to not use her left hand/thumb to lift, push, pull or carry at this time. She is currently 3 weeks post-op L CMC Arthroplasty w/ LRTI (using FCR). 3) Due to pt being 3 weeks post-op, she was instructed to remove her splint 3-4 x/day and perform gentle active wrist flexion/extension x10 reps. She was educated to respect pain and not force motion. She will re-apply her splint and wear at all other times for protection. She verbalized understanding and was Mod I donning/doffing her splint in the clinic.  4) A handout was issued and reviewed w/ pt in the clinic (HEP, scar massage & splinting use, care and precautions).   PATIENT EDUCATION: Education details: Splinting use, care and precautions, scar management, initial home program left wrist flexion/extension. Person educated: Patient Education method: Explanation, Demonstration, Verbal cues, and Handouts Education comprehension: verbalized understanding and returned demonstration  ASSESSMENT:  CLINICAL IMPRESSION: Patient is a 56 y.o. right hand dominant female who was seen today for occupational therapy evaluation and treatment for custom splinting following Left CMC arthroplasty and LRTI (reconstruction using FCR) on 01/21/21. She was instructed in splinting use, care and precautions, scar management and an initial  home program as per [place] Protocol. Patient has performance deficits in functional skills including ADLs, coordination, dexterity, ROM, strength, pain, FMC, decreased knowledge of precautions, and UE functional use. These impairments are limiting patient from  ADLs. Patient may have co-morbidities  that affects occupational performance. Patient will benefit from skilled OT to address above impairments and improve overall function.  MODIFICATION OR ASSISTANCE TO COMPLETE EVALUATION: No modification of tasks or assist necessary to complete an evaluation.  OT OCCUPATIONAL PROFILE AND HISTORY: Problem focused assessment: Including review of records relating to presenting problem.  CLINICAL DECISION MAKING: LOW - limited treatment options, no task modification necessary  REHAB POTENTIAL: Good  EVALUATION COMPLEXITY: Low     GOALS: Goals reviewed with patient? No  SHORT TERM GOALS: (STG required if POC>30 days)  STG Name Target Date Goal status  1 Pt will be Mod I splinting use, care and precautions as seen in clinic Baseline:  03/26/2021 INITIAL  2 Pt will be Mod I with scar management and desensitization as observed in the clinic Baseline:  03/26/2021 INITIAL  3 Pt will be Mod I with initial home exercise program as seen in clinic Baseline:  03/26/2021 INITIAL  LONG TERM GOALS:   LTG Name Target Date Goal status  1 Pt will be Mod I updated HEP as seen in clinic Baseline:  05/07/2021 INITIAL  2 Pt will demonstrate A/ROM left thumb WNL's as per goniometer assessment Baseline:  05/07/2021 INITIAL  3 Pt will demonstrate improved grip strength L hand as seen by 10# or greater via JAMAR Baseline:  05/07/2021 INITIAL  4 Pt will report decreased pain as seen by 2/10 or less during light functional activity or simulated ADL's Baseline:  05/07/2021 INITIAL  5 Pt will demonstrate improved coordination and dexterity as seen by 9 hole peg test score when compared to her right dominant hand. Baseline:   05/07/2021 INITIAL  6 Update and progress all goals as sppropriate Baseline:          PLAN: OT FREQUENCY: 1-2x/week  OT DURATION: 12 weeks  PLANNED INTERVENTIONS: self care/ADL training, therapeutic exercise, therapeutic activity, manual therapy, scar mobilization, passive range of motion, splinting, ultrasound, fluidotherapy, patient/family education, energy conservation, and DME and/or AE instructions  PLAN FOR NEXT SESSION: Splint check and adjustment, upgrade home program as per [place] Hand Protocol following [facility] arthroplasty.  RECOMMENDED OTHER SERVICES: ?Pt may benefit from PT orders secondary to h/o falls per her report.  CONSULTED AND AGREED WITH PLAN OF CARE: Patient Flexion (Active With Finger Extension)     Remove your splint  With left  palm down, bend wrist up and hole for 5 seconds, then bend down and hold for 5 seconds. Repeat __10__ times per set. Do __3-4_ times a day. Put splint back on after.  WEARING SCHEDULE:  Wear splint at ALL times except for scar massage as discussed (May remove splint for exercises and then immediately place back on ONLY if directed by the therapist)  PURPOSE:  To prevent movement and for protection until injury can heal  CARE OF SPLINT:  Keep splint away from heat sources including: stove, radiator or furnace, or a car in sunlight. The splint can melt and will no longer fit you properly  Keep away from pets and children  Clean the splint with rubbing alcohol as needed.  * During this time, make sure you also clean your hand/arm as instructed by your therapist and/or perform dressing changes as needed. Then dry hand/arm completely before replacing splint. (When cleaning hand/arm, keep it immobilized in same position until splint is replaced)  PRECAUTIONS/POTENTIAL PROBLEMS: *If you notice or experience increased pain, swelling, numbness, or a lingering reddened area from the splint: Contact your therapist  immediately by calling  [phone]. You must wear the splint for protection, but we will get you scheduled for adjustments as quickly as possible.  (If only straps or hooks need to be replaced and NO adjustments to the splint need to be made, just call the office ahead and let them know you are coming in)  If you have any medical concerns or signs of infection, please call your doctor immediately  Almyra Deforest, [place] 02/12/2021, 2:21 PM

## 2021-02-12 NOTE — Patient Instructions (Signed)
Flexion (Active With Finger Extension)     Remove your splint  With left  palm down, bend wrist up and hole for 5 seconds, then bend down and hold for 5 seconds. Repeat __10__ times per set. Do __3-4_ times a day. Put splint back on after.  WEARING SCHEDULE:  Wear splint at ALL times except for scar massage as discussed (May remove splint for exercises and then immediately place back on ONLY if directed by the therapist)  PURPOSE:  To prevent movement and for protection until injury can heal  CARE OF SPLINT:  Keep splint away from heat sources including: stove, radiator or furnace, or a car in sunlight. The splint can melt and will no longer fit you properly  Keep away from pets and children  Clean the splint with rubbing alcohol as needed.  * During this time, make sure you also clean your hand/arm as instructed by your therapist and/or perform dressing changes as needed. Then dry hand/arm completely before replacing splint. (When cleaning hand/arm, keep it immobilized in same position until splint is replaced)  PRECAUTIONS/POTENTIAL PROBLEMS: *If you notice or experience increased pain, swelling, numbness, or a lingering reddened area from the splint: Contact your therapist immediately by calling [phone]. You must wear the splint for protection, but we will get you scheduled for adjustments as quickly as possible.  (If only straps or hooks need to be replaced and NO adjustments to the splint need to be made, just call the office ahead and let them know you are coming in)  If you have any medical concerns or signs of infection, please call your doctor immediately

## 2021-02-13 ENCOUNTER — Ambulatory Visit: Payer: Medicare PPO | Admitting: Occupational Therapy

## 2021-02-13 ENCOUNTER — Encounter: Payer: Self-pay | Admitting: Occupational Therapy

## 2021-02-13 ENCOUNTER — Ambulatory Visit: Payer: Medicare PPO

## 2021-02-13 NOTE — Therapy (Signed)
[facility] Physical Therapy [address] [place], [place], [address] Phone: [phone]   Fax:  [phone]  Patient Details  Name: Kristen [name] MRN: [id number] Date of Birth: Jul 16, 1965 Referring Provider:  [name], MD  Encounter Date: 02/13/2021  Subjective: Pt presents to [facility] OT secondary to irritation of protective thumb spica splint. Objective: Pt splint was removed in the clinic today and stockinette was removed. Pt was w/o any pressure areas, no redness, no observable irritation. Pt does have some hypersensitivity of her dorsal thumb scar. Pt splint was assessed and it was felt that pt may benefit from adding gauze and additional stockinette to her forearm and thumb to assist with increased comfort. Pt was advised to avoid repetitive forearm rotation (supination/pronation). She was also educated in neutral forearm positioning, stretching her elbow and elevation of her UE for comfort. Pt home program was also reviewed that includes scar management/massage and desensitization. Pt stated that symptoms were relieved with application of gauze and stockinette.  Assessment: Re-dressing pt forearm with additional gauze and stockinette appears to have alleviated her symptoms. Pt educated PN:[id number] positioning, elevation and scar massage/desensitization should also assist her. Pt verbalized understanding of all of the above. OT Visit, no charge. Plan: Splint check and adjustment, progress home program as per [place] Hand Protocol for [facility] arthroplasty Left.   Percell Miller [place], [place] 02/13/2021, 11:19 AM  [facility] Physical Therapy [address] [place], [place], [address] Phone: [phone]   Fax:  [phone]

## 2021-02-18 ENCOUNTER — Encounter: Payer: Medicare PPO | Admitting: Occupational Therapy

## 2021-02-20 ENCOUNTER — Ambulatory Visit (INDEPENDENT_AMBULATORY_CARE_PROVIDER_SITE_OTHER): Payer: Self-pay | Admitting: Orthopedic Surgery

## 2021-02-20 ENCOUNTER — Other Ambulatory Visit: Payer: Self-pay

## 2021-02-20 DIAGNOSIS — M1812 Unilateral primary osteoarthritis of first carpometacarpal joint, left hand: Secondary | ICD-10-CM

## 2021-02-20 NOTE — Progress Notes (Signed)
Post-Op Visit Note   Patient: Kristen [name]           Date of Birth: 1965-08-06           MRN: [id number] Visit Date: 02/20/2021 PCP: [name], MD   Assessment & Plan:  Chief Complaint:  Chief Complaint  Patient presents with   Left Thumb - Routine Post Op   Visit Diagnoses:  1. Arthritis of carpometacarpal (CMC) joint of left thumb     Plan: Patient doing well postoperatively.  Wearing thermoplast thumb spica splint.  Thumb numbness nearly resolved.  Will continue with therapy and I'll see her back in another rmonth.   Incision clean, dry, and well healed.  No surrounding erythema.  Swelling much improved.   Follow-Up Instructions: No follow-ups on file.   Orders:  No orders of the defined types were placed in this encounter.  No orders of the defined types were placed in this encounter.   Imaging: No results found.  PMFS History: Patient Active Problem List   Diagnosis Date Noted   Tenosynovitis, de Quervain 12/14/2020   Arthritis of carpometacarpal [facility]) joint of left thumb 12/14/2020   Aortic atherosclerosis ([place]) 08/20/2020   Hyperkalemia 07/24/2020   Alcohol use 08/05/2019   Bilateral lower extremity edema 08/05/2019   Peripheral edema 03/12/2019   Iron deficiency anemia 12/11/2018   Loosening of hardware in spine [facility])    Other spondylosis with radiculopathy, lumbar region    History of lumbar spinal fusion 11/06/2018   UTI (urinary tract infection) 05/15/2017   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   Malignant neoplasm of base of tongue ([place]) 10/29/2016   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[place]) 10/29/2016   Headache associated with sexual activity 05/14/2016   Tongue lesion 05/14/2016   Thrombocytopenia ([place]) 01/22/2016   Chronic diarrhea 01/22/2016   Chest pain    Essential hypertension    Spinal stenosis of lumbar region 01/20/2016    Class: Chronic   DDD (degenerative disc disease), lumbar 01/20/2016     Class: Chronic   Spinal stenosis of lumbar region with neurogenic claudication 01/20/2016   Low back pain 12/22/2015   Hypersomnolence 10/13/2015   Muscle cramp 08/01/2015   GERD (gastroesophageal reflux disease) 07/09/2015   Morbid obesity with BMI of 40.0-44.9, adult ([place]) 01/08/2013   Past Medical History:  Diagnosis Date   Allergy    Anemia    Iron deficiency   Anxiety    Arthritis    back, left shoulder   Blood transfusion without reported diagnosis    Chicken pox    Depression    Diarrhea    takes Imodium daily   GERD (gastroesophageal reflux disease)    H/O hiatal hernia    Headache(784.0)    History of kidney stones    1996ish   History of radiation therapy 11/16/16- 01/01/17   Base of Tongue/ 66 gy in 33 fractions/ Dose: 2 Gy   Hypertension    Iron deficiency anemia 12/11/2018   Migraine    none for 5 years (as of 01/13/16)   OSA (obstructive sleep apnea) 10/13/2015   unable to get cpap, plans to get one in 2018   Pneumonia    Restless legs    Scoliosis    Shingles 08/28/2013   Shortness of breath    with exertion   Sleep apnea    Tongue cancer ([place])    tongue cancer    Family History  Problem Relation  Age of Onset   Heart disease Father    Heart attack Father    Hypertension Mother    Arthritis Mother    Diabetes Maternal Grandmother    Diabetes Paternal Grandmother    Diabetes Maternal Uncle    Colon polyps Neg Hx    Colon cancer Neg Hx    Esophageal cancer Neg Hx    Stomach cancer Neg Hx    Rectal cancer Neg Hx     Past Surgical History:  Procedure Laterality Date   BACK SURGERY  07/21/1978   CARPOMETACARPEL SUSPENSION PLASTY Left 01/21/2021   Procedure: Left Thumb Ligament Carpometacarpal Arthroplasty;  Surgeon: Sherilyn Cooter, MD;  Location: [place];  Service: Orthopedics;  Laterality: Left;   CHOLECYSTECTOMY N/A 08/15/2013   Procedure: LAPAROSCOPIC CHOLECYSTECTOMY WITH INTRAOPERATIVE CHOLANGIOGRAM;  Surgeon: Gwenyth Ober, MD;  Location: [place];  Service: General;  Laterality: N/A;   COLONOSCOPY N/A 01/09/2013   Procedure: COLONOSCOPY;  Surgeon: Beryle Beams, MD;  Location: [place];  Service: Endoscopy;  Laterality: N/A;   COLONOSCOPY  03/2018   DIRECT LARYNGOSCOPY  07/2016   Dr. Nicolette Bang [facility]   ESOPHAGOGASTRODUODENOSCOPY  03/2018   ESOPHAGOGASTRODUODENOSCOPY (EGD) WITH PROPOFOL N/A 04/24/2019   Procedure: ESOPHAGOGASTRODUODENOSCOPY (EGD) WITH PROPOFOL;  Surgeon: Gatha Mayer, MD;  [facility];  Service: Endoscopy;  Laterality: N/A;   GASTROSTOMY TUBE PLACEMENT  09/27/2016   GIVENS CAPSULE STUDY N/A 04/24/2019   Procedure: GIVENS CAPSULE STUDY;  Surgeon: Gatha Mayer, MD;  [facility];  Service: Endoscopy;  Laterality: N/A;   HERNIA REPAIR Left 1981   IR PATIENT EVAL TECH 0-60 MINS  12/13/2016   IR PATIENT EVAL TECH 0-60 MINS  04/11/2017   IR PATIENT EVAL TECH 0-60 MINS  03/24/2018   IR PATIENT EVAL TECH 0-60 MINS  11/23/2018   IR PATIENT EVAL TECH 0-60 MINS  05/28/2019   IR REPLACE G-TUBE SIMPLE WO FLUORO  12/12/2017   IR REPLACE G-TUBE SIMPLE WO FLUORO  03/29/2018   IR REPLACE G-TUBE SIMPLE WO FLUORO  06/29/2018   IR REPLACE G-TUBE SIMPLE WO FLUORO  11/02/2018   IR REPLACE G-TUBE SIMPLE WO FLUORO  03/08/2019   IR REPLACE G-TUBE SIMPLE WO FLUORO  07/02/2019   IR REPLACE G-TUBE SIMPLE WO FLUORO  11/30/2019   IR REPLACE G-TUBE SIMPLE WO FLUORO  12/27/2019   IR REPLACE G-TUBE SIMPLE WO FLUORO  05/27/2020   IR REPLACE G-TUBE SIMPLE WO FLUORO  07/09/2020   MODIFIED RADICAL NECK DISSECTION Left 09/27/2016   Levels 1 & 2   PARTIAL GLOSSECTOMY Left 09/27/2016   Left hemi partial glossectomy   SPINE SURGERY  01/20/2016   fusion   TONSILLECTOMY     tracheotomy  09/27/2016   TUBAL LIGATION  06/1988   Social History   Occupational History   Occupation: Air cabin crew  Tobacco Use   Smoking status: Never   Smokeless tobacco: Never  Vaping Use   Vaping Use: Never used  Substance and Sexual Activity    Alcohol use: Yes    Alcohol/week: 7.0 standard drinks    Types: 7 Cans of beer per week    Comment: occasionally   Drug use: No   Sexual activity: Yes    Birth control/protection: Surgical

## 2021-02-25 DIAGNOSIS — H5213 Myopia, bilateral: Secondary | ICD-10-CM | POA: Diagnosis not present

## 2021-02-25 DIAGNOSIS — H524 Presbyopia: Secondary | ICD-10-CM | POA: Diagnosis not present

## 2021-02-25 DIAGNOSIS — H52223 Regular astigmatism, bilateral: Secondary | ICD-10-CM | POA: Diagnosis not present

## 2021-02-26 ENCOUNTER — Encounter: Payer: Medicare Other | Admitting: Rehabilitative and Restorative Service Providers"

## 2021-03-04 ENCOUNTER — Ambulatory Visit (INDEPENDENT_AMBULATORY_CARE_PROVIDER_SITE_OTHER): Payer: Medicare Other | Admitting: Rehabilitative and Restorative Service Providers"

## 2021-03-04 ENCOUNTER — Other Ambulatory Visit: Payer: Self-pay

## 2021-03-04 DIAGNOSIS — R600 Localized edema: Secondary | ICD-10-CM | POA: Diagnosis not present

## 2021-03-04 DIAGNOSIS — M25542 Pain in joints of left hand: Secondary | ICD-10-CM | POA: Diagnosis not present

## 2021-03-04 DIAGNOSIS — M25642 Stiffness of left hand, not elsewhere classified: Secondary | ICD-10-CM | POA: Diagnosis not present

## 2021-03-04 DIAGNOSIS — R278 Other lack of coordination: Secondary | ICD-10-CM

## 2021-03-04 DIAGNOSIS — M6281 Muscle weakness (generalized): Secondary | ICD-10-CM | POA: Diagnosis not present

## 2021-03-04 NOTE — Therapy (Signed)
OUTPATIENT OCCUPATIONAL THERAPY TREATMENT NOTE   Patient Name: Kristen [name] MRN: [id number] DOB:20-Aug-1965, 56 y.o., female Today's Date: 03/04/2021  PCP: [name], MD REFERRING PROVIDER: Jessy Oto, MD   OT End of Session - 03/04/21 1102     Visit Number 2    Number of Visits 24    Date for OT Re-Evaluation 03/12/21    Authorization Type [employer]    Authorization - Visit Number 2    Progress Note Due on Visit 10    OT Start Time 1102    OT Stop Time 1151    OT Time Calculation (min) 49 min    Activity Tolerance Patient tolerated treatment well;Patient limited by pain    Behavior During Therapy [facility] for tasks assessed/performed             Past Medical History:  Diagnosis Date   Allergy    Anemia    Iron deficiency   Anxiety    Arthritis    back, left shoulder   Blood transfusion without reported diagnosis    Chicken pox    Depression    Diarrhea    takes Imodium daily   GERD (gastroesophageal reflux disease)    H/O hiatal hernia    Headache(784.0)    History of kidney stones    1996ish   History of radiation therapy 11/16/16- 01/01/17   Base of Tongue/ 66 gy in 33 fractions/ Dose: 2 Gy   Hypertension    Iron deficiency anemia 12/11/2018   Migraine    none for 5 years (as of 01/13/16)   OSA (obstructive sleep apnea) 10/13/2015   unable to get cpap, plans to get one in 2018   Pneumonia    Restless legs    Scoliosis    Shingles 08/28/2013   Shortness of breath    with exertion   Sleep apnea    Tongue cancer ([place])    tongue cancer   Past Surgical History:  Procedure Laterality Date   BACK SURGERY  07/21/1978   CARPOMETACARPEL SUSPENSION PLASTY Left 01/21/2021   Procedure: Left Thumb Ligament Carpometacarpal Arthroplasty;  Surgeon: Sherilyn Cooter, MD;  Location: [place];  Service: Orthopedics;  Laterality: Left;   CHOLECYSTECTOMY N/A 08/15/2013   Procedure: LAPAROSCOPIC CHOLECYSTECTOMY WITH INTRAOPERATIVE CHOLANGIOGRAM;  Surgeon:  Gwenyth Ober, MD;  Location: [place];  Service: General;  Laterality: N/A;   COLONOSCOPY N/A 01/09/2013   Procedure: COLONOSCOPY;  Surgeon: Beryle Beams, MD;  Location: [place];  Service: Endoscopy;  Laterality: N/A;   COLONOSCOPY  03/2018   DIRECT LARYNGOSCOPY  07/2016   Dr. Nicolette Bang [facility]   ESOPHAGOGASTRODUODENOSCOPY  03/2018   ESOPHAGOGASTRODUODENOSCOPY (EGD) WITH PROPOFOL N/A 04/24/2019   Procedure: ESOPHAGOGASTRODUODENOSCOPY (EGD) WITH PROPOFOL;  Surgeon: Gatha Mayer, MD;  Location: [facility];  Service: Endoscopy;  Laterality: N/A;   GASTROSTOMY TUBE PLACEMENT  09/27/2016   GIVENS CAPSULE STUDY N/A 04/24/2019   Procedure: GIVENS CAPSULE STUDY;  Surgeon: Gatha Mayer, MD;  Location: [facility];  Service: Endoscopy;  Laterality: N/A;   HERNIA REPAIR Left 1981   IR PATIENT EVAL TECH 0-60 MINS  12/13/2016   IR PATIENT EVAL TECH 0-60 MINS  04/11/2017   IR PATIENT EVAL TECH 0-60 MINS  03/24/2018   IR PATIENT EVAL TECH 0-60 MINS  11/23/2018   IR PATIENT EVAL TECH 0-60 MINS  05/28/2019   IR REPLACE G-TUBE SIMPLE WO FLUORO  12/12/2017   IR REPLACE G-TUBE SIMPLE WO FLUORO  03/29/2018  IR REPLACE G-TUBE SIMPLE WO FLUORO  06/29/2018   IR REPLACE G-TUBE SIMPLE WO FLUORO  11/02/2018   IR REPLACE G-TUBE SIMPLE WO FLUORO  03/08/2019   IR REPLACE G-TUBE SIMPLE WO FLUORO  07/02/2019   IR REPLACE G-TUBE SIMPLE WO FLUORO  11/30/2019   IR REPLACE G-TUBE SIMPLE WO FLUORO  12/27/2019   IR REPLACE G-TUBE SIMPLE WO FLUORO  05/27/2020   IR REPLACE G-TUBE SIMPLE WO FLUORO  07/09/2020   MODIFIED RADICAL NECK DISSECTION Left 09/27/2016   Levels 1 & 2   PARTIAL GLOSSECTOMY Left 09/27/2016   Left hemi partial glossectomy   SPINE SURGERY  01/20/2016   fusion   TONSILLECTOMY     tracheotomy  09/27/2016   TUBAL LIGATION  06/1988   Patient Active Problem List   Diagnosis Date Noted   Tenosynovitis, de [place] 12/14/2020   Arthritis of carpometacarpal ([place]) joint of left thumb 12/14/2020   Aortic  atherosclerosis ([place]) 08/20/2020   Hyperkalemia 07/24/2020   Alcohol use 08/05/2019   Bilateral lower extremity edema 08/05/2019   Peripheral edema 03/12/2019   Iron deficiency anemia 12/11/2018   Loosening of hardware in spine [facility])    Other spondylosis with radiculopathy, lumbar region    History of lumbar spinal fusion 11/06/2018   UTI (urinary tract infection) 05/15/2017   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   Malignant neoplasm of base of tongue ([place]) 10/29/2016   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[place]) 10/29/2016   Headache associated with sexual activity 05/14/2016   Tongue lesion 05/14/2016   Thrombocytopenia ([place]) 01/22/2016   Chronic diarrhea 01/22/2016   Chest pain    Essential hypertension    Spinal stenosis of lumbar region 01/20/2016    Class: Chronic   DDD (degenerative disc disease), lumbar 01/20/2016    Class: Chronic   Spinal stenosis of lumbar region with neurogenic claudication 01/20/2016   Low back pain 12/22/2015   Hypersomnolence 10/13/2015   Muscle cramp 08/01/2015   GERD (gastroesophageal reflux disease) 07/09/2015   Morbid obesity with BMI of 40.0-44.9, adult ([place]) 01/08/2013    ONSET DATE: 01/21/2021   REFERRING DIAG: Left thumb CMC OA, pt is s/p Left thumb CMC interposition arthroplasty (Left thumb FCR tendon transfer).  THERAPY DIAG:  Localized edema  Pain in joint of left hand  Stiffness of left hand, not elsewhere classified  Muscle weakness (generalized)  Other lack of coordination   PERTINENT HISTORY: 6 weeks post-op LRTI 03/04/21.  From Eval 02/12/21: Pt reports that she tried pre-fabricated splinting prior to left [facility] surgery. 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 Pt also has h/o tongue cancer, anxiety and depression. Please refer to chart for complete medical history.  PRECAUTIONS: follow LRTI protocols  SUBJECTIVE: She apologizes for missing appts for ~3  weeks, she states transportation issues. She states not wearing brace bc it hurt her, but she was trying to not use Lt hand.   PAIN:  Are you having pain? Yes NPRS scale: 3/10 Pain location: volar and dorsal wrist  Pain orientation: Left  PAIN TYPE: aching and dull Pain description: intermittent  Aggravating factors: "if I do too much"  Relieving factors: rest, "pain spray"   OBJECTIVE:    ADLs: Overall ADLs: Pt is overall Mod I with ADL's using her right dominant hand and not the left. She lives with family members that are able to assist her PRN.   SENSATION: Light touch: Appears intact to  light touch.   COORDINATION: 9 Hole Peg test: Right: NT sec; Left: NT sec Box and Blocks: NT Comments: NT, deferred   EDEMA: None noted or observed (Left thumb, wrist or hand.   SKIN INTEGRITY: Scars appear well healed. Pt reports that sutures were removed on 01/30/21.   HAND A/PROM:   A/PROM Right 02/12/2021 Left 02/12/2021  Thumb MCP (0-60) NT NT  Thumb IP (0-80)      Thumb Radial abd/add (0-55)      Thumb Palmar abd/add (0-45)      Thumb opposition to index      Index MCP (0-90) NT WNL  Index PIP (0-100)      Index DIP (0-70)      Long MCP (0-90)   WNL  Long PIP (0-100)      Long DIP (0-70)      Ring MCP (0-90_   WNL  Ring PIP (0-100)      Ring DIP (0-70)      Little MCP (0-90)   WNL  Little PIP (0-100)      Little DIP (0-70)      (Blank rows = not tested)   UE MMT:   MMT Right 02/12/2021 Left 02/12/2021  Shoulder flexion NT NT  Shoulder abduction      Shoulder adduction      Shoulder extension      Shoulder internal rotation      Shoulder external rotation      Middle trapezius      Lower trapezius      Elbow flexion      Elbow extension      Wrist flexion      Wrist extension      Wrist ulnar deviation      Wrist radial deviation      Wrist pronation      Wrist supination      (Blank rows = not tested)   HAND FUNCTION:   Grip strength: Right: NT lbs;  Left: NT lbs Lateral pinch: Right: NT lbs, Left: NT lbs 3 point pinch: Right: NT lbs, Left: NT lbs Comments: Deferred   TODAY'S TREATMENT:  03/04/21: OT creates new custom hand based opponens orthotic as long spica was too restrictive now and also rubbing her again. She states this fits well with no irritation- she is to wear during repetitive activity in day and at night PRN.  OT also given upgraded comprehensive HEP since she hasn't been here in 3 weeks.  Exercises as listed below, she tolerates well, no pain.   Exercises Seated Composite Thumb Flexion PROM - 2-3 x daily - 3-5 reps - 15 sec hold Seated Thumb and Index Finger Webspace Stretch - 2-3 x daily - 3-5 reps - 15 sec hold Resisted Finger Abduction - Index and Middle - 2-3 x daily - 5-10 reps - 2-3 sec hold Towel Roll Grip with Forearm in Neutral - 2-3 x daily - 3-5 reps - 10 sec hold Thumb Opposition - 2-3 x daily - 10 reps Seated Wrist Flexion Stretch - 2-3 x daily - 3 reps - 15 hold Wrist Extension Stretch Pronated - 2-3 x daily - 3 reps - 15 hold   From eval 02/12/21: 1) Pt was instructed in a home program for scar massage 1-2x/day for 5 min sessions left UE (thumb, volar wrist and forearm). 2) Pt was fitted with a left custom forearm based thumb spica splint that places her thumb midway between palmar and radial abduction secondary to reconstruction.  She was educated in splinting use, care and precautions. She was educated to not use her left hand/thumb to lift, push, pull or carry at this time. She is currently 3 weeks post-op L CMC Arthroplasty w/ LRTI (using FCR). 3) Due to pt being 3 weeks post-op, she was instructed to remove her splint 3-4 x/day and perform gentle active wrist flexion/extension x10 reps. She was educated to respect pain and not force motion. She will re-apply her splint and wear at all other times for protection. She verbalized understanding and was Mod I donning/doffing her splint in the clinic.  4) A handout  was issued and reviewed w/ pt in the clinic (HEP, scar massage & splinting use, care and precautions).     PATIENT EDUCATION: Education details: Splinting use, care and precautions, scar management, initial home program left wrist flexion/extension. Person educated: Patient Education method: Explanation, Demonstration, Verbal cues, and Handouts Education comprehension: verbalized understanding and returned demonstration  HEP:  Access Code: [email or web address] URL: [email or web address] Date: 03/04/2021 Prepared by: Benito Mccreedy   ASSESSMENT:   CLINICAL IMPRESSION: Pt has some stiffness from not attending therapy and not upgrading exercises, but hopefully that will now start to resolve. She will also need to work on strength if she can continue to attend sessions.   From eval 02/12/21: Patient is a 56 y.o. right hand dominant female who was seen today for occupational therapy evaluation and treatment for custom splinting following Left CMC arthroplasty and LRTI (reconstruction using FCR) on 01/21/21. She was instructed in splinting use, care and precautions, scar management and an initial home program as per [place] Protocol. Patient has performance deficits in functional skills including ADLs, coordination, dexterity, ROM, strength, pain, FMC, decreased knowledge of precautions, and UE functional use. These impairments are limiting patient from ADLs. Patient may have co-morbidities  that affects occupational performance. Patient will benefit from skilled OT to address above impairments and improve overall function.     GOALS: Goals reviewed with patient? No   SHORT TERM GOALS: (STG required if POC>30 days)   STG Name Target Date Goal status  1 Pt will be Mod I splinting use, care and precautions as seen in clinic Baseline:  03/26/2021 INITIAL  2 Pt will be Mod I with scar management and desensitization as observed in the clinic Baseline:  03/26/2021 INITIAL  3 Pt will be Mod I with  initial home exercise program as seen in clinic Baseline:  03/26/2021 INITIAL  LONG TERM GOALS:    LTG Name Target Date Goal status  1 Pt will be Mod I updated HEP as seen in clinic Baseline:  05/07/2021 INITIAL  2 Pt will demonstrate A/ROM left thumb WNL's as per goniometer assessment Baseline:  05/07/2021 INITIAL  3 Pt will demonstrate improved grip strength L hand as seen by 10# or greater via JAMAR Baseline:  05/07/2021 INITIAL  4 Pt will report decreased pain as seen by 2/10 or less during light functional activity or simulated ADL's Baseline:  05/07/2021 INITIAL  5 Pt will demonstrate improved coordination and dexterity as seen by 9 hole peg test score when compared to her right dominant hand. Baseline:  05/07/2021 INITIAL  6 Update and progress all goals as sppropriate Baseline:                 PLAN: OT FREQUENCY: 1-2x/week   OT DURATION: 12 weeks   PLANNED INTERVENTIONS: self care/ADL training, therapeutic exercise, therapeutic activity, manual therapy, scar mobilization, passive range of motion, splinting, ultrasound, fluidotherapy, patient/family  education, energy conservation, and DME and/or AE instructions   PLAN FOR NEXT SESSION: Orthotic check, HEP review, add gentle strength for wrist, hand next session as tolerated. 03/12/21 due for reassessment.      RECOMMENDED OTHER SERVICES: Pt may benefit from PT orders secondary to h/o falls per her report.   CONSULTED AND AGREED WITH PLAN OF CARE: Patient   Benito Mccreedy, OTR/L, CHT 03/04/2021, 11:54 AM

## 2021-03-09 DIAGNOSIS — Z20822 Contact with and (suspected) exposure to covid-19: Secondary | ICD-10-CM | POA: Diagnosis not present

## 2021-03-11 ENCOUNTER — Ambulatory Visit (INDEPENDENT_AMBULATORY_CARE_PROVIDER_SITE_OTHER): Payer: Medicare Other | Admitting: Rehabilitative and Restorative Service Providers"

## 2021-03-11 ENCOUNTER — Other Ambulatory Visit: Payer: Self-pay

## 2021-03-11 DIAGNOSIS — M25542 Pain in joints of left hand: Secondary | ICD-10-CM | POA: Diagnosis not present

## 2021-03-11 DIAGNOSIS — M6281 Muscle weakness (generalized): Secondary | ICD-10-CM | POA: Diagnosis not present

## 2021-03-11 DIAGNOSIS — R600 Localized edema: Secondary | ICD-10-CM

## 2021-03-11 DIAGNOSIS — M25642 Stiffness of left hand, not elsewhere classified: Secondary | ICD-10-CM

## 2021-03-11 NOTE — Therapy (Signed)
OUTPATIENT OCCUPATIONAL THERAPY TREATMENT  NOTE   Patient Name: Kristen [name] MRN: [id number] DOB:[birth year], 56 y.o., female Today's Date: 03/11/2021  PCP: [name], MD REFERRING PROVIDER: Jessy Oto, MD   OT End of Session - 03/11/21 1511     Visit Number 3    Number of Visits 24    Date for OT Re-Evaluation 04/30/21    Authorization Type [employer]    Authorization - Visit Number 3    Progress Note Due on Visit 10    OT Start Time 1435    OT Stop Time 1507    OT Time Calculation (min) 32 min    Activity Tolerance Patient tolerated treatment well;Patient limited by pain    Behavior During Therapy WFL for tasks assessed/performed               Past Medical History:  Diagnosis Date   Allergy    Anemia    Iron deficiency   Anxiety    Arthritis    back, left shoulder   Blood transfusion without reported diagnosis    Chicken pox    Depression    Diarrhea    takes Imodium daily   GERD (gastroesophageal reflux disease)    H/O hiatal hernia    Headache(784.0)    History of kidney stones    1996ish   History of radiation therapy 11/16/16- 01/01/17   Base of Tongue/ 66 gy in 33 fractions/ Dose: 2 Gy   Hypertension    Iron deficiency anemia 12/11/2018   Migraine    none for 5 years (as of 01/13/16)   OSA (obstructive sleep apnea) 10/13/2015   unable to get cpap, plans to get one in 2018   Pneumonia    Restless legs    Scoliosis    Shingles 08/28/2013   Shortness of breath    with exertion   Sleep apnea    Tongue cancer ([place])    tongue cancer   Past Surgical History:  Procedure Laterality Date   BACK SURGERY  07/21/1978   CARPOMETACARPEL SUSPENSION PLASTY Left 01/21/2021   Procedure: Left Thumb Ligament Carpometacarpal Arthroplasty;  Surgeon: Sherilyn Cooter, MD;  Location: [place];  Service: Orthopedics;  Laterality: Left;   CHOLECYSTECTOMY N/A 08/15/2013   Procedure: LAPAROSCOPIC CHOLECYSTECTOMY WITH INTRAOPERATIVE CHOLANGIOGRAM;   Surgeon: Gwenyth Ober, MD;  Location: [place];  Service: General;  Laterality: N/A;   COLONOSCOPY N/A 01/09/2013   Procedure: COLONOSCOPY;  Surgeon: Beryle Beams, MD;  Location: [place];  Service: Endoscopy;  Laterality: N/A;   COLONOSCOPY  03/2018   DIRECT LARYNGOSCOPY  07/2016   Dr. Nicolette Bang [facility]   ESOPHAGOGASTRODUODENOSCOPY  03/2018   ESOPHAGOGASTRODUODENOSCOPY (EGD) WITH PROPOFOL N/A 04/24/2019   Procedure: ESOPHAGOGASTRODUODENOSCOPY (EGD) WITH PROPOFOL;  Surgeon: Gatha Mayer, MD;  Location: [facility];  Service: Endoscopy;  Laterality: N/A;   GASTROSTOMY TUBE PLACEMENT  09/27/2016   GIVENS CAPSULE STUDY N/A 04/24/2019   Procedure: GIVENS CAPSULE STUDY;  Surgeon: Gatha Mayer, MD;  Location: [facility];  Service: Endoscopy;  Laterality: N/A;   HERNIA REPAIR Left 1981   IR PATIENT EVAL TECH 0-60 MINS  12/13/2016   IR PATIENT EVAL TECH 0-60 MINS  04/11/2017   IR PATIENT EVAL TECH 0-60 MINS  03/24/2018   IR PATIENT EVAL TECH 0-60 MINS  11/23/2018   IR PATIENT EVAL TECH 0-60 MINS  05/28/2019   IR REPLACE G-TUBE SIMPLE WO FLUORO  12/12/2017   IR REPLACE G-TUBE SIMPLE WO FLUORO  03/29/2018   IR REPLACE G-TUBE SIMPLE WO FLUORO  06/29/2018   IR REPLACE G-TUBE SIMPLE WO FLUORO  11/02/2018   IR REPLACE G-TUBE SIMPLE WO FLUORO  03/08/2019   IR REPLACE G-TUBE SIMPLE WO FLUORO  07/02/2019   IR REPLACE G-TUBE SIMPLE WO FLUORO  11/30/2019   IR REPLACE G-TUBE SIMPLE WO FLUORO  12/27/2019   IR REPLACE G-TUBE SIMPLE WO FLUORO  05/27/2020   IR REPLACE G-TUBE SIMPLE WO FLUORO  07/09/2020   MODIFIED RADICAL NECK DISSECTION Left 09/27/2016   Levels 1 & 2   PARTIAL GLOSSECTOMY Left 09/27/2016   Left hemi partial glossectomy   SPINE SURGERY  01/20/2016   fusion   TONSILLECTOMY     tracheotomy  09/27/2016   TUBAL LIGATION  06/1988   Patient Active Problem List   Diagnosis Date Noted   Tenosynovitis, de [place] 12/14/2020   Arthritis of carpometacarpal ([place]) joint of left thumb 12/14/2020    Aortic atherosclerosis ([place]) 08/20/2020   Hyperkalemia 07/24/2020   Alcohol use 08/05/2019   Bilateral lower extremity edema 08/05/2019   Peripheral edema 03/12/2019   Iron deficiency anemia 12/11/2018   Loosening of hardware in spine [facility])    Other spondylosis with radiculopathy, lumbar region    History of lumbar spinal fusion 11/06/2018   UTI (urinary tract infection) 05/15/2017   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   Malignant neoplasm of base of tongue ([place]) 10/29/2016   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[place]) 10/29/2016   Headache associated with sexual activity 05/14/2016   Tongue lesion 05/14/2016   Thrombocytopenia ([place]) 01/22/2016   Chronic diarrhea 01/22/2016   Chest pain    Essential hypertension    Spinal stenosis of lumbar region 01/20/2016    Class: Chronic   DDD (degenerative disc disease), lumbar 01/20/2016    Class: Chronic   Spinal stenosis of lumbar region with neurogenic claudication 01/20/2016   Low back pain 12/22/2015   Hypersomnolence 10/13/2015   Muscle cramp 08/01/2015   GERD (gastroesophageal reflux disease) 07/09/2015   Morbid obesity with BMI of 40.0-44.9, adult ([place]) 01/08/2013    ONSET DATE: 01/21/2021   REFERRING DIAG: Left thumb CMC OA, pt is s/p Left thumb CMC interposition arthroplasty (Left thumb FCR tendon transfer).  THERAPY DIAG:  Stiffness of left hand, not elsewhere classified  Pain in joint of left hand  Localized edema  Muscle weakness (generalized)   PERTINENT HISTORY: 7 weeks post-op LRTI 03/11/21.  From Eval 02/12/21: Pt reports that she tried pre-fabricated splinting prior to left [facility] surgery. 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 Pt also has h/o tongue cancer, anxiety and depression. Please refer to chart for complete medical history.  PRECAUTIONS: follow LRTI protocols  SUBJECTIVE: She states doing HEP 3x day following videos. She states being  extra tired from running around yesterday a lot.   She apologizes for missing appts for ~3 weeks, she states transportation issues. She states not wearing brace bc it hurt her, but she was trying to not use Lt hand.   PAIN:  Are you having pain? Yes NPRS scale: 2/10 Pain location: volar and dorsal wrist  Pain orientation: Left  PAIN TYPE: aching and dull Pain description: intermittent  Aggravating factors: "if I do too much"  Relieving factors: rest, "pain spray"   OBJECTIVE:    ADLs: Overall ADLs: Pt is overall Mod I with ADL's using her right dominant hand and not the left. She  lives with family members that are able to assist her PRN.   SENSATION: Light touch: Appears intact to light touch.   COORDINATION: 9 Hole Peg test: Right: NT sec; Left: NT sec Box and Blocks: NT Comments: NT, deferred   EDEMA: None noted or observed (Left thumb, wrist or hand.   SKIN INTEGRITY: Scars appear well healed. Pt reports that sutures were removed on 01/30/21.   HAND A/PROM:   A/PROM Right 02/12/2021 Left 02/12/2021  Thumb MCP (0-60) NT NT  Thumb IP (0-80)      Thumb Radial abd/add (0-55)      Thumb Palmar abd/add (0-45)      Thumb opposition to index      Index MCP (0-90) NT WNL  Index PIP (0-100)      Index DIP (0-70)      Long MCP (0-90)   WNL  Long PIP (0-100)      Long DIP (0-70)      Ring MCP (0-90_   WNL  Ring PIP (0-100)      Ring DIP (0-70)      Little MCP (0-90)   WNL  Little PIP (0-100)      Little DIP (0-70)      (Blank rows = not tested)   UE MMT:   MMT Right 02/12/2021 Left 02/12/2021  Shoulder flexion NT NT  Shoulder abduction      Shoulder adduction      Shoulder extension      Shoulder internal rotation      Shoulder external rotation      Middle trapezius      Lower trapezius      Elbow flexion      Elbow extension      Wrist flexion      Wrist extension      Wrist ulnar deviation      Wrist radial deviation      Wrist pronation      Wrist  supination      (Blank rows = not tested)   HAND FUNCTION:   Grip strength: Right: NT lbs; Left: NT lbs Lateral pinch: Right: NT lbs, Left: NT lbs 3 point pinch: Right: NT lbs, Left: NT lbs Comments: Deferred   TODAY'S TREATMENT:  03/11/21: Pt reviews HEP with OT. OT does manual therapy for wrist, thumb stretches with review stretch education. OT also gives new strength at wrist and forearm which she performs back well, no pain.  See below for specific exercises.   Exercises Seated Composite Thumb Flexion PROM - 2-3 x daily - 3-5 reps - 15 sec hold Seated Thumb and Index Finger Webspace Stretch - 2-3 x daily - 3-5 reps - 15 sec hold Resisted Finger Abduction - Index and Middle - 2-3 x daily - 5-10 reps - 2-3 sec hold Towel Roll Grip with Forearm in Neutral - 2-3 x daily - 3-5 reps - 10 sec hold Thumb Opposition - 2-3 x daily - 10 reps Seated Wrist Flexion Stretch - 2-3 x daily - 3 reps - 15 hold Wrist Extension Stretch Pronated - 2-3 x daily - 3 reps - 15 hold Wrist Flexion with Dumbbell - 3 x daily - 1 sets - 10-15 reps Wrist Extension with Dumbbell - 3 x daily - 1 sets - 10-15 reps Forearm PROM Supination and Pronation with Mid Grip on Hammer - 3 x daily - 1 sets - 10-15 reps   03/04/21: OT creates new custom hand based opponens orthotic as long spica  was too restrictive now and also rubbing her again. She states this fits well with no irritation- she is to wear during repetitive activity in day and at night PRN.  OT also given upgraded comprehensive HEP since she hasn't been here in 3 weeks.  Exercises as listed below, she tolerates well, no pain.     From eval 02/12/21: 1) Pt was instructed in a home program for scar massage 1-2x/day for 5 min sessions left UE (thumb, volar wrist and forearm). 2) Pt was fitted with a left custom forearm based thumb spica splint that places her thumb midway between palmar and radial abduction secondary to reconstruction. She was educated in splinting  use, care and precautions. She was educated to not use her left hand/thumb to lift, push, pull or carry at this time. She is currently 3 weeks post-op L CMC Arthroplasty w/ LRTI (using FCR). 3) Due to pt being 3 weeks post-op, she was instructed to remove her splint 3-4 x/day and perform gentle active wrist flexion/extension x10 reps. She was educated to respect pain and not force motion. She will re-apply her splint and wear at all other times for protection. She verbalized understanding and was Mod I donning/doffing her splint in the clinic.  4) A handout was issued and reviewed w/ pt in the clinic (HEP, scar massage & splinting use, care and precautions).     PATIENT EDUCATION: Education details: Splinting use, care and precautions, scar management, initial home program left wrist flexion/extension. Person educated: Patient Education method: Explanation, Demonstration, Verbal cues, and Handouts Education comprehension: verbalized understanding and returned demonstration  HEP:  Access Code: [email or web address] URL: [email or web address] Date: 03/04/2021 Prepared by: Benito Mccreedy   ASSESSMENT:   CLINICAL IMPRESSION: 03/11/21: Pt improving in use and function of hand. She needs to be cautious to not over-use as she has stopped wearing orthotic per self-reports   03/04/21: Pt has some stiffness from not attending therapy and not upgrading exercises, but hopefully that will now start to resolve. She will also need to work on strength if she can continue to attend sessions.   From eval 02/12/21: Patient is a 56 y.o. right hand dominant female who was seen today for occupational therapy evaluation and treatment for custom splinting following Left CMC arthroplasty and LRTI (reconstruction using FCR) on 01/21/21. She was instructed in splinting use, care and precautions, scar management and an initial home program as per [place] Protocol. Patient has performance deficits in functional skills  including ADLs, coordination, dexterity, ROM, strength, pain, FMC, decreased knowledge of precautions, and UE functional use. These impairments are limiting patient from ADLs. Patient may have co-morbidities  that affects occupational performance. Patient will benefit from skilled OT to address above impairments and improve overall function.     GOALS: Goals reviewed with patient? No   SHORT TERM GOALS: (STG required if POC>30 days)   STG Name Target Date Goal status  1 Pt will be Mod I splinting use, care and precautions as seen in clinic Baseline:  03/26/2021 INITIAL  2 Pt will be Mod I with scar management and desensitization as observed in the clinic Baseline:  03/26/2021 INITIAL  3 Pt will be Mod I with initial home exercise program as seen in clinic Baseline:  03/26/2021 INITIAL  LONG TERM GOALS:    LTG Name Target Date Goal status  1 Pt will be Mod I updated HEP as seen in clinic Baseline:  05/07/2021 INITIAL  2 Pt will demonstrate A/ROM left thumb WNL's as per  goniometer assessment Baseline:  05/07/2021 INITIAL  3 Pt will demonstrate improved grip strength L hand as seen by 10# or greater via JAMAR Baseline:  05/07/2021 INITIAL  4 Pt will report decreased pain as seen by 2/10 or less during light functional activity or simulated ADL's Baseline:  05/07/2021 INITIAL  5 Pt will demonstrate improved coordination and dexterity as seen by 9 hole peg test score when compared to her right dominant hand. Baseline:  05/07/2021 INITIAL  6 Update and progress all goals as sppropriate Baseline:                 PLAN: OT FREQUENCY: 1-2x/week   OT DURATION: 12 weeks   PLANNED INTERVENTIONS: self care/ADL training, therapeutic exercise, therapeutic activity, manual therapy, scar mobilization, passive range of motion, splinting, ultrasound, fluidotherapy, patient/family education, energy conservation, and DME and/or AE instructions   PLAN FOR NEXT SESSION: Check orthotic, review HEP and progress  strength as tolerated. Pt has 12 week POC and only will need reassessment every 10 visits or after 90 days.  We can do a reassessment in 1-2 weeks, just to determine goal status.     RECOMMENDED OTHER SERVICES: Pt may benefit from PT orders secondary to h/o falls per her report.   CONSULTED AND AGREED WITH PLAN OF CARE: Patient   Benito Mccreedy, OTR/L, CHT 03/11/2021, 3:16 PM

## 2021-03-13 DIAGNOSIS — Z1152 Encounter for screening for COVID-19: Secondary | ICD-10-CM | POA: Diagnosis not present

## 2021-03-16 ENCOUNTER — Telehealth: Payer: Self-pay | Admitting: Family Medicine

## 2021-03-16 DIAGNOSIS — Z20822 Contact with and (suspected) exposure to covid-19: Secondary | ICD-10-CM | POA: Diagnosis not present

## 2021-03-16 NOTE — Telephone Encounter (Signed)
Pt is calling to let the office know [employer] will be faxing over order for her syringes for her feeding tude

## 2021-03-16 NOTE — Telephone Encounter (Signed)
Noted  

## 2021-03-18 ENCOUNTER — Encounter: Payer: Medicare Other | Admitting: Rehabilitative and Restorative Service Providers"

## 2021-03-18 ENCOUNTER — Other Ambulatory Visit: Payer: Self-pay

## 2021-03-18 ENCOUNTER — Encounter (HOSPITAL_COMMUNITY): Payer: Self-pay | Admitting: Radiology

## 2021-03-18 ENCOUNTER — Ambulatory Visit (INDEPENDENT_AMBULATORY_CARE_PROVIDER_SITE_OTHER): Payer: Medicare Other | Admitting: Rehabilitative and Restorative Service Providers"

## 2021-03-18 DIAGNOSIS — M25542 Pain in joints of left hand: Secondary | ICD-10-CM

## 2021-03-18 DIAGNOSIS — R278 Other lack of coordination: Secondary | ICD-10-CM | POA: Diagnosis not present

## 2021-03-18 DIAGNOSIS — R600 Localized edema: Secondary | ICD-10-CM | POA: Diagnosis not present

## 2021-03-18 DIAGNOSIS — M25642 Stiffness of left hand, not elsewhere classified: Secondary | ICD-10-CM | POA: Diagnosis not present

## 2021-03-18 DIAGNOSIS — M6281 Muscle weakness (generalized): Secondary | ICD-10-CM | POA: Diagnosis not present

## 2021-03-18 NOTE — Therapy (Signed)
OUTPATIENT OCCUPATIONAL THERAPY TREATMENT  & DISCHARGE NOTE   Patient Name: Kristen [name] MRN: [id number] DOB:09/08/1965, 56 y.o., female Today's Date: 03/18/2021  PCP: [name], MD REFERRING PROVIDER: [name], MD   OT End of Session - 03/18/21 1427     Visit Number 4    Number of Visits 24    Date for OT Re-Evaluation 04/30/21    Authorization Type [employer]    Authorization - Visit Number 4    Progress Note Due on Visit 10    OT Start Time 4656    OT Stop Time 1513    OT Time Calculation (min) 45 min    Activity Tolerance Patient tolerated treatment well;No increased pain    Behavior During Therapy WFL for tasks assessed/performed                Past Medical History:  Diagnosis Date   Allergy    Anemia    Iron deficiency   Anxiety    Arthritis    back, left shoulder   Blood transfusion without reported diagnosis    Chicken pox    Depression    Diarrhea    takes Imodium daily   GERD (gastroesophageal reflux disease)    H/O hiatal hernia    Headache(784.0)    History of kidney stones    1996ish   History of radiation therapy 11/16/16- 01/01/17   Base of Tongue/ 66 gy in 33 fractions/ Dose: 2 Gy   Hypertension    Iron deficiency anemia 12/11/2018   Migraine    none for 5 years (as of 01/13/16)   OSA (obstructive sleep apnea) 10/13/2015   unable to get cpap, plans to get one in 2018   Pneumonia    Restless legs    Scoliosis    Shingles 08/28/2013   Shortness of breath    with exertion   Sleep apnea    Tongue cancer ([place])    tongue cancer   Past Surgical History:  Procedure Laterality Date   BACK SURGERY  07/21/1978   CARPOMETACARPEL SUSPENSION PLASTY Left 01/21/2021   Procedure: Left Thumb Ligament Carpometacarpal Arthroplasty;  Surgeon: Sherilyn Cooter, MD;  Location: [place];  Service: Orthopedics;  Laterality: Left;   CHOLECYSTECTOMY N/A 08/15/2013   Procedure: LAPAROSCOPIC CHOLECYSTECTOMY WITH INTRAOPERATIVE CHOLANGIOGRAM;   Surgeon: Gwenyth Ober, MD;  Location: [place];  Service: General;  Laterality: N/A;   COLONOSCOPY N/A 01/09/2013   Procedure: COLONOSCOPY;  Surgeon: Beryle Beams, MD;  Location: [place];  Service: Endoscopy;  Laterality: N/A;   COLONOSCOPY  03/2018   DIRECT LARYNGOSCOPY  07/2016   Dr. Nicolette Bang [facility]   ESOPHAGOGASTRODUODENOSCOPY  03/2018   ESOPHAGOGASTRODUODENOSCOPY (EGD) WITH PROPOFOL N/A 04/24/2019   Procedure: ESOPHAGOGASTRODUODENOSCOPY (EGD) WITH PROPOFOL;  Surgeon: Gatha Mayer, MD;  [facility];  Service: Endoscopy;  Laterality: N/A;   GASTROSTOMY TUBE PLACEMENT  09/27/2016   GIVENS CAPSULE STUDY N/A 04/24/2019   Procedure: GIVENS CAPSULE STUDY;  Surgeon: Gatha Mayer, MD;  [facility];  Service: Endoscopy;  Laterality: N/A;   HERNIA REPAIR Left 1981   IR PATIENT EVAL TECH 0-60 MINS  12/13/2016   IR PATIENT EVAL TECH 0-60 MINS  04/11/2017   IR PATIENT EVAL TECH 0-60 MINS  03/24/2018   IR PATIENT EVAL TECH 0-60 MINS  11/23/2018   IR PATIENT EVAL TECH 0-60 MINS  05/28/2019   IR REPLACE G-TUBE SIMPLE WO FLUORO  12/12/2017   IR REPLACE G-TUBE SIMPLE WO  FLUORO  03/29/2018   IR REPLACE G-TUBE SIMPLE WO FLUORO  06/29/2018   IR REPLACE G-TUBE SIMPLE WO FLUORO  11/02/2018   IR REPLACE G-TUBE SIMPLE WO FLUORO  03/08/2019   IR REPLACE G-TUBE SIMPLE WO FLUORO  07/02/2019   IR REPLACE G-TUBE SIMPLE WO FLUORO  11/30/2019   IR REPLACE G-TUBE SIMPLE WO FLUORO  12/27/2019   IR REPLACE G-TUBE SIMPLE WO FLUORO  05/27/2020   IR REPLACE G-TUBE SIMPLE WO FLUORO  07/09/2020   MODIFIED RADICAL NECK DISSECTION Left 09/27/2016   Levels 1 & 2   PARTIAL GLOSSECTOMY Left 09/27/2016   Left hemi partial glossectomy   SPINE SURGERY  01/20/2016   fusion   TONSILLECTOMY     tracheotomy  09/27/2016   TUBAL LIGATION  06/1988   Patient Active Problem List   Diagnosis Date Noted   Tenosynovitis, de Quervain 12/14/2020   Arthritis of carpometacarpal ([place]) joint of left thumb 12/14/2020    Aortic atherosclerosis ([place]) 08/20/2020   Hyperkalemia 07/24/2020   Alcohol use 08/05/2019   Bilateral lower extremity edema 08/05/2019   Peripheral edema 03/12/2019   Iron deficiency anemia 12/11/2018   Loosening of hardware in spine [facility])    Other spondylosis with radiculopathy, lumbar region    History of lumbar spinal fusion 11/06/2018   UTI (urinary tract infection) 05/15/2017   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   Malignant neoplasm of base of tongue ([place]) 10/29/2016   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[place]) 10/29/2016   Headache associated with sexual activity 05/14/2016   Tongue lesion 05/14/2016   Thrombocytopenia ([place]) 01/22/2016   Chronic diarrhea 01/22/2016   Chest pain    Essential hypertension    Spinal stenosis of lumbar region 01/20/2016    Class: Chronic   DDD (degenerative disc disease), lumbar 01/20/2016    Class: Chronic   Spinal stenosis of lumbar region with neurogenic claudication 01/20/2016   Low back pain 12/22/2015   Hypersomnolence 10/13/2015   Muscle cramp 08/01/2015   GERD (gastroesophageal reflux disease) 07/09/2015   Morbid obesity with BMI of 40.0-44.9, adult ([place]) 01/08/2013    ONSET DATE: 01/21/2021   REFERRING DIAG: Left thumb CMC OA, pt is s/p Left thumb CMC interposition arthroplasty (Left thumb FCR tendon transfer).  THERAPY DIAG:  Localized edema  Pain in joint of left hand  Stiffness of left hand, not elsewhere classified  Muscle weakness (generalized)  Other lack of coordination   PERTINENT HISTORY: 8 weeks post-op LRTI now  From Eval 02/12/21: Pt reports that she tried pre-fabricated splinting prior to left [facility] surgery. 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 Pt also has h/o tongue cancer, anxiety and depression. Please refer to chart for complete medical history.  PRECAUTIONS: follow LRTI protocols  SUBJECTIVE:  She states some pain at night, might be  the cold, also mildly aching right now, but nothing else new. She states that she knows HEP, feels comfortable continuing on herself, especially since she has multiple other health issues now and wishes to focus on those.    PAIN:  Are you having pain? No NPRS scale: 2/10  Pain location: volar and dorsal wrist  Pain orientation: Left  PAIN TYPE: aching and dull Pain description: intermittent  Aggravating factors: "if I do too much"  Relieving factors: rest, "pain spray"   OBJECTIVE:    ADLs: Overall ADLs: Pt is overall Mod I with ADL's using her right dominant hand and  not the left. She lives with family members that are able to assist her PRN.   SENSATION: Light touch: Appears intact to light touch.   COORDINATION: 9 Hole Peg test: Right: NT sec; Left: NT sec Box and Blocks: NT Comments: NT, deferred   EDEMA: None noted or observed (Left thumb, wrist or hand.   SKIN INTEGRITY: Scars appear well healed. Pt reports that sutures were removed on 01/30/21.   HAND A/PROM: 03/18/21: Rt wrist 65* flex, 50* ext; Lt 54* flex, 59* ext    A/PROM Right 02/12/2021 Left 02/12/2021  Thumb MCP (0-60) NT NT  Thumb IP (0-80)      Thumb Radial abd/add (0-55)      Thumb Palmar abd/add (0-45)      Thumb opposition to index      Index MCP (0-90) NT WNL  Index PIP (0-100)      Index DIP (0-70)      Long MCP (0-90)   WNL  Long PIP (0-100)      Long DIP (0-70)      Ring MCP (0-90_   WNL  Ring PIP (0-100)      Ring DIP (0-70)      Little MCP (0-90)   WNL  Little PIP (0-100)      Little DIP (0-70)      (Blank rows = not tested)   UE MMT: NT at eval      HAND FUNCTION: updated 03/18/21:   Grip strength: Right: 42 lbs; Left: 15 lbs    TODAY'S TREATMENT:  03/18/21: OT adjusts orthotic around thumb for her comfort. OT also check grip today 8 weeks out and she has mild pain in lt hand and impaired grip compared to rt side. She is given very light therapy putty and asking her to add strength  in tip pinch, opposition pinch, adduction of fingers, abduction of thumb and fingers, and finger extension. Other parts of HEP (stretches at thumb and wrist) also performed and reviewed without issues.  OT also educated her to avoid painful or repetitive home activities for 2-3 more months and wear orthotic if she must do something like that.  She can otherwise wean from orthotic or discontinue use now, if she is comfortable with that. She states understanding all of that, and is happy to discharge today to self-care    PATIENT EDUCATION: Education details: Splinting use, care and precautions, scar management, initial home program left wrist flexion/extension. Person educated: Patient Education method: Explanation, Demonstration, Verbal cues, and Handouts Education comprehension: verbalized understanding and returned demonstration  HEP:  Access Code: [email or web address] URL: [email or web address] Prepared by: Benito Mccreedy   ASSESSMENT:   CLINICAL IMPRESSION: 03/18/21: Due to pt having no significant pain or functional problems and needing to focus more on other health concerns, she will d/c to HEP and self-care today.   GOALS: Goals reviewed with patient? No   SHORT TERM GOALS: (STG required if POC>30 days)   STG Name Target Date Goal status  1 Pt will be Mod I splinting use, care and precautions as seen in clinic Baseline:  03/26/2021 Met 03/18/21  2 Pt will be Mod I with scar management and desensitization as observed in the clinic Baseline:  03/26/2021 Met 03/18/21  3 Pt will be Mod I with initial home exercise program as seen in clinic Baseline:  03/26/2021 Met 03/18/21  LONG TERM GOALS:    LTG Name Target Date Goal status  1 Pt will be Mod I updated HEP as seen  in clinic Baseline:  05/07/2021 Met 03/18/21  2 Pt will demonstrate A/ROM left thumb WNL's as per goniometer assessment Baseline:  05/07/2021 Met 03/18/21  3 Pt will demonstrate improved grip strength L hand as seen by 10# or  greater via JAMAR Baseline:  05/07/2021 Met 03/18/21  4 Pt will report decreased pain as seen by 2/10 or less during light functional activity or simulated ADL's Baseline:  05/07/2021 Met 03/18/21  5 Pt will demonstrate improved coordination and dexterity as seen by 9 hole peg test score when compared to her right dominant hand. Baseline:  05/07/2021 Not Met as 9HPT not done, but she states no problem with buttoning and FMS around the house    PLAN: OT FREQUENCY: 1-2x/week   OT DURATION: 12 weeks   PLANNED INTERVENTIONS: self care/ADL training, therapeutic exercise, therapeutic activity, manual therapy, scar mobilization, passive range of motion, splinting, ultrasound, fluidotherapy, patient/family education, energy conservation, and DME and/or AE instructions   PLAN FOR NEXT SESSION:  N/A d/c now successfully   RECOMMENDED OTHER SERVICES: Pt may benefit from PT orders secondary to h/o falls per her report.   CONSULTED AND AGREED WITH PLAN OF CARE: Patient   Benito Mccreedy, OTR/L, CHT 03/18/2021, 4:46 PM    OCCUPATIONAL THERAPY DISCHARGE SUMMARY  Visits from Start of Care: 4  Current functional level related to goals / functional outcomes: Pt has met all goals to satisfactory levels and is pleased with outcomes.   Remaining deficits: Pt has no more significant functional deficits or pain.   Education / Equipment: Pt has all needed materials and education. Pt understands how to continue on with self-management. See tx notes for more details.   Patient agrees to discharge due to max benefits received from outpatient occupational therapy / hand therapy at this time.   Benito Mccreedy, OTR/L, CHT 03/18/2021, 4:46 PM

## 2021-03-18 NOTE — Therapy (Incomplete)
OUTPATIENT OCCUPATIONAL THERAPY TREATMENT  NOTE   Patient Name: Kristen [name] MRN: [id number] DOB:1965-06-13, 56 y.o., female Today's Date: 03/18/2021  PCP: [name], MD REFERRING PROVIDER: [name], MD       Past Medical History:  Diagnosis Date   Allergy    Anemia    Iron deficiency   Anxiety    Arthritis    back, left shoulder   Blood transfusion without reported diagnosis    Chicken pox    Depression    Diarrhea    takes Imodium daily   GERD (gastroesophageal reflux disease)    H/O hiatal hernia    Headache(784.0)    History of kidney stones    1996ish   History of radiation therapy 11/16/16- 01/01/17   Base of Tongue/ 66 gy in 33 fractions/ Dose: 2 Gy   Hypertension    Iron deficiency anemia 12/11/2018   Migraine    none for 5 years (as of 01/13/16)   OSA (obstructive sleep apnea) 10/13/2015   unable to get cpap, plans to get one in 2018   Pneumonia    Restless legs    Scoliosis    Shingles 08/28/2013   Shortness of breath    with exertion   Sleep apnea    Tongue cancer ([place])    tongue cancer   Past Surgical History:  Procedure Laterality Date   BACK SURGERY  07/21/1978   CARPOMETACARPEL SUSPENSION PLASTY Left 01/21/2021   Procedure: Left Thumb Ligament Carpometacarpal Arthroplasty;  Surgeon: Sherilyn Cooter, MD;  Location: [place];  Service: Orthopedics;  Laterality: Left;   CHOLECYSTECTOMY N/A 08/15/2013   Procedure: LAPAROSCOPIC CHOLECYSTECTOMY WITH INTRAOPERATIVE CHOLANGIOGRAM;  Surgeon: Gwenyth Ober, MD;  Location: [place];  Service: General;  Laterality: N/A;   COLONOSCOPY N/A 01/09/2013   Procedure: COLONOSCOPY;  Surgeon: Beryle Beams, MD;  Location: [place];  Service: Endoscopy;  Laterality: N/A;   COLONOSCOPY  03/2018   DIRECT LARYNGOSCOPY  07/2016   Dr. Nicolette Bang [facility]   ESOPHAGOGASTRODUODENOSCOPY  03/2018   ESOPHAGOGASTRODUODENOSCOPY (EGD) WITH PROPOFOL N/A 04/24/2019   Procedure: ESOPHAGOGASTRODUODENOSCOPY (EGD) WITH  PROPOFOL;  Surgeon: Gatha Mayer, MD;  [facility];  Service: Endoscopy;  Laterality: N/A;   GASTROSTOMY TUBE PLACEMENT  09/27/2016   GIVENS CAPSULE STUDY N/A 04/24/2019   Procedure: GIVENS CAPSULE STUDY;  Surgeon: Gatha Mayer, MD;  [facility];  Service: Endoscopy;  Laterality: N/A;   HERNIA REPAIR Left 1981   IR PATIENT EVAL TECH 0-60 MINS  12/13/2016   IR PATIENT EVAL TECH 0-60 MINS  04/11/2017   IR PATIENT EVAL TECH 0-60 MINS  03/24/2018   IR PATIENT EVAL TECH 0-60 MINS  11/23/2018   IR PATIENT EVAL TECH 0-60 MINS  05/28/2019   IR REPLACE G-TUBE SIMPLE WO FLUORO  12/12/2017   IR REPLACE G-TUBE SIMPLE WO FLUORO  03/29/2018   IR REPLACE G-TUBE SIMPLE WO FLUORO  06/29/2018   IR REPLACE G-TUBE SIMPLE WO FLUORO  11/02/2018   IR REPLACE G-TUBE SIMPLE WO FLUORO  03/08/2019   IR REPLACE G-TUBE SIMPLE WO FLUORO  07/02/2019   IR REPLACE G-TUBE SIMPLE WO FLUORO  11/30/2019   IR REPLACE G-TUBE SIMPLE WO FLUORO  12/27/2019   IR REPLACE G-TUBE SIMPLE WO FLUORO  05/27/2020   IR REPLACE G-TUBE SIMPLE WO FLUORO  07/09/2020   MODIFIED RADICAL NECK DISSECTION Left 09/27/2016   Levels 1 & 2   PARTIAL GLOSSECTOMY Left 09/27/2016   Left hemi partial glossectomy  SPINE SURGERY  01/20/2016   fusion   TONSILLECTOMY     tracheotomy  09/27/2016   TUBAL LIGATION  06/1988   Patient Active Problem List   Diagnosis Date Noted   Tenosynovitis, de Quervain 12/14/2020   Arthritis of carpometacarpal [facility]) joint of left thumb 12/14/2020   Aortic atherosclerosis ([place]) 08/20/2020   Hyperkalemia 07/24/2020   Alcohol use 08/05/2019   Bilateral lower extremity edema 08/05/2019   Peripheral edema 03/12/2019   Iron deficiency anemia 12/11/2018   Loosening of hardware in spine [facility])    Other spondylosis with radiculopathy, lumbar region    History of lumbar spinal fusion 11/06/2018   UTI (urinary tract infection) 05/15/2017   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   Malignant  neoplasm of base of tongue ([place]) 10/29/2016   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[place]) 10/29/2016   Headache associated with sexual activity 05/14/2016   Tongue lesion 05/14/2016   Thrombocytopenia ([place]) 01/22/2016   Chronic diarrhea 01/22/2016   Chest pain    Essential hypertension    Spinal stenosis of lumbar region 01/20/2016    Class: Chronic   DDD (degenerative disc disease), lumbar 01/20/2016    Class: Chronic   Spinal stenosis of lumbar region with neurogenic claudication 01/20/2016   Low back pain 12/22/2015   Hypersomnolence 10/13/2015   Muscle cramp 08/01/2015   GERD (gastroesophageal reflux disease) 07/09/2015   Morbid obesity with BMI of 40.0-44.9, adult ([place]) 01/08/2013    ONSET DATE: 01/21/2021   REFERRING DIAG: Left thumb CMC OA, pt is s/p Left thumb CMC interposition arthroplasty (Left thumb FCR tendon transfer).  THERAPY DIAG:  No diagnosis found.   PERTINENT HISTORY: 7 weeks post-op LRTI 03/11/21.  From Eval 02/12/21: Pt reports that she tried pre-fabricated splinting prior to left [facility] surgery. 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 Pt also has h/o tongue cancer, anxiety and depression. Please refer to chart for complete medical history.  PRECAUTIONS: follow LRTI protocols  SUBJECTIVE:  ***   PAIN:  Are you having pain? Yes NPRS scale: 2/10 Pain location: volar and dorsal wrist  Pain orientation: Left  PAIN TYPE: aching and dull Pain description: intermittent  Aggravating factors: "if I do too much"  Relieving factors: rest, "pain spray"   OBJECTIVE:    ADLs: Overall ADLs: Pt is overall Mod I with ADL's using her right dominant hand and not the left. She lives with family members that are able to assist her PRN.   SENSATION: Light touch: Appears intact to light touch.   COORDINATION: 9 Hole Peg test: Right: NT sec; Left: NT sec Box and Blocks: NT Comments: NT, deferred   EDEMA: None noted or  observed (Left thumb, wrist or hand.   SKIN INTEGRITY: Scars appear well healed. Pt reports that sutures were removed on 01/30/21.   HAND A/PROM:   A/PROM Right 02/12/2021 Left 02/12/2021  Thumb MCP (0-60) NT NT  Thumb IP (0-80)      Thumb Radial abd/add (0-55)      Thumb Palmar abd/add (0-45)      Thumb opposition to index      Index MCP (0-90) NT WNL  Index PIP (0-100)      Index DIP (0-70)      Long MCP (0-90)   WNL  Long PIP (0-100)      Long DIP (0-70)      Ring MCP (0-90_   WNL  Ring PIP (  0-100)      Ring DIP (0-70)      Little MCP (0-90)   WNL  Little PIP (0-100)      Little DIP (0-70)      (Blank rows = not tested)   UE MMT:   MMT Right 02/12/2021 Left 02/12/2021  Shoulder flexion NT NT  Shoulder abduction      Shoulder adduction      Shoulder extension      Shoulder internal rotation      Shoulder external rotation      Middle trapezius      Lower trapezius      Elbow flexion      Elbow extension      Wrist flexion      Wrist extension      Wrist ulnar deviation      Wrist radial deviation      Wrist pronation      Wrist supination      (Blank rows = not tested)   HAND FUNCTION:   Grip strength: Right: NT lbs; Left: NT lbs Lateral pinch: Right: NT lbs, Left: NT lbs 3 point pinch: Right: NT lbs, Left: NT lbs Comments: Deferred   TODAY'S TREATMENT:  03/18/21: ***  03/11/21: Pt reviews HEP with OT. OT does manual therapy for wrist, thumb stretches with review stretch education. OT also gives new strength at wrist and forearm which she performs back well, no pain.  See below for specific exercises.   Exercises Seated Composite Thumb Flexion PROM - 2-3 x daily - 3-5 reps - 15 sec hold Seated Thumb and Index Finger Webspace Stretch - 2-3 x daily - 3-5 reps - 15 sec hold Resisted Finger Abduction - Index and Middle - 2-3 x daily - 5-10 reps - 2-3 sec hold Towel Roll Grip with Forearm in Neutral - 2-3 x daily - 3-5 reps - 10 sec hold Thumb Opposition - 2-3  x daily - 10 reps Seated Wrist Flexion Stretch - 2-3 x daily - 3 reps - 15 hold Wrist Extension Stretch Pronated - 2-3 x daily - 3 reps - 15 hold Wrist Flexion with Dumbbell - 3 x daily - 1 sets - 10-15 reps Wrist Extension with Dumbbell - 3 x daily - 1 sets - 10-15 reps Forearm PROM Supination and Pronation with Mid Grip on Hammer - 3 x daily - 1 sets - 10-15 reps   03/04/21: OT creates new custom hand based opponens orthotic as long spica was too restrictive now and also rubbing her again. She states this fits well with no irritation- she is to wear during repetitive activity in day and at night PRN.  OT also given upgraded comprehensive HEP since she hasn't been here in 3 weeks.  Exercises as listed below, she tolerates well, no pain.   PATIENT EDUCATION: Education details: Splinting use, care and precautions, scar management, initial home program left wrist flexion/extension. Person educated: Patient Education method: Explanation, Demonstration, Verbal cues, and Handouts Education comprehension: verbalized understanding and returned demonstration  HEP:  Access Code: [email or web address] URL: [email or web address] Date: 03/04/2021 Prepared by: Benito Mccreedy   ASSESSMENT:   CLINICAL IMPRESSION: 03/18/21: ***  03/11/21: Pt improving in use and function of hand. She needs to be cautious to not over-use as she has stopped wearing orthotic per self-reports   03/04/21: Pt has some stiffness from not attending therapy and not upgrading exercises, but hopefully that will now start to resolve. She will also need to work on strength if  she can continue to attend sessions.       GOALS: Goals reviewed with patient? No   SHORT TERM GOALS: (STG required if POC>30 days)   STG Name Target Date Goal status  1 Pt will be Mod I splinting use, care and precautions as seen in clinic Baseline:  03/26/2021 INITIAL  2 Pt will be Mod I with scar management and desensitization as observed in the  clinic Baseline:  03/26/2021 INITIAL  3 Pt will be Mod I with initial home exercise program as seen in clinic Baseline:  03/26/2021 INITIAL  LONG TERM GOALS:    LTG Name Target Date Goal status  1 Pt will be Mod I updated HEP as seen in clinic Baseline:  05/07/2021 INITIAL  2 Pt will demonstrate A/ROM left thumb WNL's as per goniometer assessment Baseline:  05/07/2021 INITIAL  3 Pt will demonstrate improved grip strength L hand as seen by 10# or greater via JAMAR Baseline:  05/07/2021 INITIAL  4 Pt will report decreased pain as seen by 2/10 or less during light functional activity or simulated ADL's Baseline:  05/07/2021 INITIAL  5 Pt will demonstrate improved coordination and dexterity as seen by 9 hole peg test score when compared to her right dominant hand. Baseline:  05/07/2021 INITIAL  6 Update and progress all goals as sppropriate Baseline:                 PLAN: OT FREQUENCY: 1-2x/week   OT DURATION: 12 weeks   PLANNED INTERVENTIONS: self care/ADL training, therapeutic exercise, therapeutic activity, manual therapy, scar mobilization, passive range of motion, splinting, ultrasound, fluidotherapy, patient/family education, energy conservation, and DME and/or AE instructions   PLAN FOR NEXT SESSION:  *** Check orthotic, review HEP and progress strength as tolerated. Pt has 12 week POC and only will need reassessment every 10 visits or after 90 days.  We can do a reassessment in 1-2 weeks, just to determine goal status.     RECOMMENDED OTHER SERVICES: Pt may benefit from PT orders secondary to h/o falls per her report.   CONSULTED AND AGREED WITH PLAN OF CARE: Patient   Benito Mccreedy, OTR/L, CHT 03/18/2021, 7:58 AM

## 2021-03-20 ENCOUNTER — Ambulatory Visit (INDEPENDENT_AMBULATORY_CARE_PROVIDER_SITE_OTHER): Payer: Medicare Other | Admitting: Orthopedic Surgery

## 2021-03-20 ENCOUNTER — Other Ambulatory Visit: Payer: Self-pay

## 2021-03-20 DIAGNOSIS — Z20822 Contact with and (suspected) exposure to covid-19: Secondary | ICD-10-CM | POA: Diagnosis not present

## 2021-03-20 DIAGNOSIS — Z1152 Encounter for screening for COVID-19: Secondary | ICD-10-CM | POA: Diagnosis not present

## 2021-03-20 DIAGNOSIS — M1812 Unilateral primary osteoarthritis of first carpometacarpal joint, left hand: Secondary | ICD-10-CM

## 2021-03-20 NOTE — Progress Notes (Signed)
? ?Post-Op Visit Note ?  ?Patient: Kristen [name]           ?Date of Birth: April 25, 1965           ?MRN: [id number] ?Visit Date: 03/20/2021 ?PCP: [name], MD ? ? ?Assessment & Plan: ? ?Chief Complaint:  ?Chief Complaint  ?Patient presents with  ? Left Thumb - Follow-up  ?  She has been going to OT (last visit was yesterday), she states that she is doing much better, OT gave her exercise videos to watch and do.  ? ?Visit Diagnoses:  ?1. Arthritis of carpometacarpal (CMC) joint of left thumb   ? ? ?Plan: Patient is now 8 weeks out from her LRTI procedure.  She has no pain in the thumb.  She has been discharged from therapy to work on strengthening with a home exercise program.  She is very pleased with her result at this point.  She can see me back in a month or two if she has any concerns.  ? ?Follow-Up Instructions: No follow-ups on file.  ? ?Orders:  ?No orders of the defined types were placed in this encounter. ? ?No orders of the defined types were placed in this encounter. ? ? ?Imaging: ?No results found. ? ?PMFS History: ?Patient Active Problem List  ? Diagnosis Date Noted  ? Tenosynovitis, de Quervain 12/14/2020  ? Arthritis of carpometacarpal [facility]) joint of left thumb 12/14/2020  ? Aortic atherosclerosis ([place]) 08/20/2020  ? Hyperkalemia 07/24/2020  ? Alcohol use 08/05/2019  ? Bilateral lower extremity edema 08/05/2019  ? Peripheral edema 03/12/2019  ? Iron deficiency anemia 12/11/2018  ? Loosening of hardware in spine [facility])   ? Other spondylosis with radiculopathy, lumbar region   ? History of lumbar spinal fusion 11/06/2018  ? UTI (urinary tract infection) 05/15/2017  ? 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 Malignant neoplasm of base of tongue ([place]) 10/29/2016  ? 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[place]) 10/29/2016  ? Headache associated with sexual activity 05/14/2016  ? Tongue lesion 05/14/2016  ? Thrombocytopenia ([place]) 01/22/2016  ? Chronic diarrhea 01/22/2016  ? Chest  pain   ? Essential hypertension   ? Spinal stenosis of lumbar region 01/20/2016  ?  Class: Chronic  ? DDD (degenerative disc disease), lumbar 01/20/2016  ?  Class: Chronic  ? Spinal stenosis of lumbar region with neurogenic claudication 01/20/2016  ? Low back pain 12/22/2015  ? Hypersomnolence 10/13/2015  ? Muscle cramp 08/01/2015  ? GERD (gastroesophageal reflux disease) 07/09/2015  ? Morbid obesity with BMI of 40.0-44.9, adult ([place]) 01/08/2013  ? ?Past Medical History:  ?Diagnosis Date  ? Allergy   ? Anemia   ? Iron deficiency  ? Anxiety   ? Arthritis   ? back, left shoulder  ? Blood transfusion without reported diagnosis   ? Chicken pox   ? Depression   ? Diarrhea   ? takes Imodium daily  ? GERD (gastroesophageal reflux disease)   ? H/O hiatal hernia   ? Headache(784.0)   ? History of kidney stones   ? 1996ish  ? History of radiation therapy 11/16/16- 01/01/17  ? Base of Tongue/ 66 gy in 33 fractions/ Dose: 2 Gy  ? Hypertension   ? Iron deficiency anemia 12/11/2018  ? Migraine   ? none for 5 years (as of 01/13/16)  ? OSA (obstructive sleep apnea) 10/13/2015  ? unable to get cpap, plans to get one in 2018  ? Pneumonia   ?  Restless legs   ? Scoliosis   ? Shingles 08/28/2013  ? Shortness of breath   ? with exertion  ? Sleep apnea   ? Tongue cancer ([place])   ? tongue cancer  ?  ?Family History  ?Problem Relation Age of Onset  ? Heart disease Father   ? Heart attack Father   ? Hypertension Mother   ? Arthritis Mother   ? Diabetes Maternal Grandmother   ? Diabetes Paternal Grandmother   ? Diabetes Maternal Uncle   ? Colon polyps Neg Hx   ? Colon cancer Neg Hx   ? Esophageal cancer Neg Hx   ? Stomach cancer Neg Hx   ? Rectal cancer Neg Hx   ?  ?Past Surgical History:  ?Procedure Laterality Date  ? BACK SURGERY  07/21/1978  ? CARPOMETACARPEL SUSPENSION PLASTY Left 01/21/2021  ? Procedure: Left Thumb Ligament Carpometacarpal Arthroplasty;  Surgeon: Sherilyn Cooter, MD;  Location: [place];  Service: Orthopedics;  Laterality: Left;   ? CHOLECYSTECTOMY N/A 08/15/2013  ? Procedure: LAPAROSCOPIC CHOLECYSTECTOMY WITH INTRAOPERATIVE CHOLANGIOGRAM;  Surgeon: Gwenyth Ober, MD;  Location: [place];  Service: General;  Laterality: N/A;  ? COLONOSCOPY N/A 01/09/2013  ? Procedure: COLONOSCOPY;  Surgeon: Beryle Beams, MD;  Location: [place];  Service: Endoscopy;  Laterality: N/A;  ? COLONOSCOPY  03/2018  ? DIRECT LARYNGOSCOPY  07/2016  ? Dr. Nicolette Bang [facility]  ? ESOPHAGOGASTRODUODENOSCOPY  03/2018  ? ESOPHAGOGASTRODUODENOSCOPY (EGD) WITH PROPOFOL N/A 04/24/2019  ? Procedure: ESOPHAGOGASTRODUODENOSCOPY (EGD) WITH PROPOFOL;  Surgeon: Gatha Mayer, MD;  Location: [facility];  Service: Endoscopy;  Laterality: N/A;  ? GASTROSTOMY TUBE PLACEMENT  09/27/2016  ? GIVENS CAPSULE STUDY N/A 04/24/2019  ? Procedure: GIVENS CAPSULE STUDY;  Surgeon: Gatha Mayer, MD;  [facility];  Service: Endoscopy;  Laterality: N/A;  ? HERNIA REPAIR Left 1981  ? IR PATIENT EVAL TECH 0-60 MINS  12/13/2016  ? IR PATIENT EVAL TECH 0-60 MINS  04/11/2017  ? IR PATIENT EVAL TECH 0-60 MINS  03/24/2018  ? IR PATIENT EVAL TECH 0-60 MINS  11/23/2018  ? IR PATIENT EVAL TECH 0-60 MINS  05/28/2019  ? IR REPLACE G-TUBE SIMPLE WO FLUORO  12/12/2017  ? IR REPLACE G-TUBE SIMPLE WO FLUORO  03/29/2018  ? IR REPLACE G-TUBE SIMPLE WO FLUORO  06/29/2018  ? IR REPLACE G-TUBE SIMPLE WO FLUORO  11/02/2018  ? IR REPLACE G-TUBE SIMPLE WO FLUORO  03/08/2019  ? IR REPLACE G-TUBE SIMPLE WO FLUORO  07/02/2019  ? IR REPLACE G-TUBE SIMPLE WO FLUORO  11/30/2019  ? IR REPLACE G-TUBE SIMPLE WO FLUORO  12/27/2019  ? IR REPLACE G-TUBE SIMPLE WO FLUORO  05/27/2020  ? IR REPLACE G-TUBE SIMPLE WO FLUORO  07/09/2020  ? MODIFIED RADICAL NECK DISSECTION Left 09/27/2016  ? Levels 1 & 2  ? PARTIAL GLOSSECTOMY Left 09/27/2016  ? Left hemi partial glossectomy  ? SPINE SURGERY  01/20/2016  ? fusion  ? TONSILLECTOMY    ? tracheotomy  09/27/2016  ? TUBAL LIGATION  06/1988  ? ?Social History  ? ?Occupational History  ? Occupation:  Air cabin crew  ?Tobacco Use  ? Smoking status: Never  ? Smokeless tobacco: Never  ?Vaping Use  ? Vaping Use: Never used  ?Substance and Sexual Activity  ? Alcohol use: Yes  ?  Alcohol/week: 7.0 standard drinks  ?  Types: 7 Cans of beer per week  ?  Comment: occasionally  ? Drug use: No  ? Sexual activity: Yes  ?  Birth  control/protection: Surgical  ? ? ? ?

## 2021-03-23 DIAGNOSIS — Z20822 Contact with and (suspected) exposure to covid-19: Secondary | ICD-10-CM | POA: Diagnosis not present

## 2021-03-28 DIAGNOSIS — Z1152 Encounter for screening for COVID-19: Secondary | ICD-10-CM | POA: Diagnosis not present

## 2021-03-30 ENCOUNTER — Other Ambulatory Visit: Payer: Self-pay | Admitting: Specialist

## 2021-03-30 ENCOUNTER — Encounter: Payer: Self-pay | Admitting: Family Medicine

## 2021-03-31 ENCOUNTER — Encounter: Payer: Self-pay | Admitting: Family Medicine

## 2021-03-31 ENCOUNTER — Telehealth: Payer: Self-pay | Admitting: Family Medicine

## 2021-03-31 ENCOUNTER — Other Ambulatory Visit: Payer: Self-pay | Admitting: Specialist

## 2021-03-31 NOTE — Telephone Encounter (Signed)
Patient called in stated that [facility] [facility] is requesting the notes from her last visit and prescription for the syringes. ? ?Paperwork could be faxed to [phone]. ? ?Please advise. ?

## 2021-04-02 ENCOUNTER — Ambulatory Visit: Payer: Medicare Other | Admitting: Family Medicine

## 2021-04-02 NOTE — Telephone Encounter (Signed)
Rec'd TE for orders. Pt crrived lte for appt, rescheduled for tomorrow 04/03/21. ?

## 2021-04-02 NOTE — Telephone Encounter (Signed)
Pt had to reschedule appointment. ?

## 2021-04-03 ENCOUNTER — Ambulatory Visit (INDEPENDENT_AMBULATORY_CARE_PROVIDER_SITE_OTHER): Payer: Medicare Other | Admitting: Family Medicine

## 2021-04-03 VITALS — BP 116/80 | HR 114 | Temp 99.1°F | Wt 160.0 lb

## 2021-04-03 DIAGNOSIS — I10 Essential (primary) hypertension: Secondary | ICD-10-CM

## 2021-04-03 DIAGNOSIS — Z23 Encounter for immunization: Secondary | ICD-10-CM | POA: Diagnosis not present

## 2021-04-03 DIAGNOSIS — F321 Major depressive disorder, single episode, moderate: Secondary | ICD-10-CM | POA: Diagnosis not present

## 2021-04-03 DIAGNOSIS — F411 Generalized anxiety disorder: Secondary | ICD-10-CM | POA: Diagnosis not present

## 2021-04-03 DIAGNOSIS — Z85810 Personal history of malignant neoplasm of tongue: Secondary | ICD-10-CM | POA: Diagnosis not present

## 2021-04-03 MED ORDER — SERTRALINE HCL 25 MG PO TABS: 25.0000 mg | ORAL_TABLET | Freq: Every day | ORAL | 1 refills | Status: DC

## 2021-04-03 NOTE — Telephone Encounter (Signed)
Pt advised will need to reach out to GI/ place she goes for her feeding tube for supplies order.  ?

## 2021-04-03 NOTE — Progress Notes (Signed)
Subjective:  ? ? Patient ID: Kristen [name], female    DOB: 05-26-1965, 56 y.o.   MRN: [id number] ? ?Chief Complaint  ?Patient presents with  ? Depression  ?  Wants to check for DM  ? Immunizations  ?  Shingles and flu  ? Psoriasis  ? ? 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  Pt notes increased stress recently as she was worried about getting syringes for g-tube, her health, and family issues.  Pt needed a new rx for syringes after her medical supply company changed.  Using G-tube for meds and some supplements.  Not eating as much po as smells often make it difficulty to swallow.  Pt was seen at [facility] on [address] a few wks ago for blurred vision, like a film over her eyes.  Pt states she was told she had herpes in her eyes which made her upset.  Pt states vision improved after taking pills for the infection. Pt endorses increased depression symptoms including not wanting to leave the house, crying, feeling hopeless, decreased energy, increased sleeping.  Denies HI/SI.  In therapy at cancer center.  Interested in medication. ? ?Pt planning to have surgery on her back in a few wks.   ? ?Pt inquires about having several immunizations done. ? ? ?Past Medical History:  ?Diagnosis Date  ? Allergy   ? Anemia   ? Iron deficiency  ? Anxiety   ? Arthritis   ? back, left shoulder  ? Blood transfusion without reported diagnosis   ? Chicken pox   ? Depression   ? Diarrhea   ? takes Imodium daily  ? GERD (gastroesophageal reflux disease)   ? H/O hiatal hernia   ? Headache(784.0)   ? History of kidney stones   ? 1996ish  ? History of radiation therapy 11/16/16- 01/01/17  ? Base of Tongue/ 66 gy in 33  fractions/ Dose: 2 Gy  ? Hypertension   ? Iron deficiency anemia 12/11/2018  ? Migraine   ? none for 5 years (as of 01/13/16)  ? OSA (obstructive sleep apnea) 10/13/2015  ? unable to get cpap, plans to get one in 2018  ? Pneumonia   ? Restless legs   ? Scoliosis   ? Shingles 08/28/2013  ? Shortness of breath   ? with exertion  ? Sleep apnea   ? Tongue cancer ([place])   ? tongue cancer  ? ? ?Allergies  ?Allergen Reactions  ? Elemental Sulfur Hives and Swelling  ? Other Hives and Swelling  ?  tomato  ? Sulfa Antibiotics Hives  ? ? ?ROS ?General: Denies fever, chills, night sweats, changes in weight, changes in appetite + decreased appetite, decreased energy, increased sleeping ?HEENT: Denies headaches, ear pain, changes in vision, rhinorrhea, sore throat ?CV: Denies CP, palpitations, SOB, orthopnea ?Pulm: Denies SOB, cough, wheezing ?GI: Denies abdominal pain, nausea, vomiting, diarrhea, constipation ?GU: Denies dysuria, hematuria, frequency, vaginal discharge ?Msk: Denies muscle cramps, joint pains + low back pain with radiculopathy ?Neuro: Denies weakness, numbness, tingling ?Skin: Denies rashes, bruising ?Psych: Denies hallucinations + anxiety, depression ? ?   ?Objective:  ?  ?Blood pressure 116/80, pulse (!) 114, temperature 99.1 ?F (37.3 ?C), temperature source Oral, weight 160  lb (72.6 kg), last menstrual period 11/15/2013, SpO2 99 %. ? ?Gen. Pleasant, well-nourished, mildly slurred speech 2/2 h/o tongue resection, in no distress, depressed affect   ?HEENT: [place]/AT, face symmetric, conjunctiva clear, no scleral icterus, PERRLA, EOMI, nares patent without drainage ?Lungs: no accessory muscle use, CTAB, no wheezes or rales ?Cardiovascular: Tachycardia, no m/r/g, no peripheral edema ?Musculoskeletal: No deformities, no cyanosis or clubbing, normal tone ?Neuro:  A&Ox3, CN II-XII intact, normal gait ?Skin:  Warm, no lesions/ rash ? ?Wt Readings from Last 3 Encounters:  ?04/03/21 160 lb (72.6 kg)  ?01/21/21 158 lb (71.7 kg)   ?01/13/21 153 lb 8 oz (69.6 kg)  ? ? ?Lab Results  ?Component Value Date  ? WBC 3.7 (L) 01/21/2021  ? HGB 12.0 01/21/2021  ? HCT 39.2 01/21/2021  ? PLT 372 01/21/2021  ? GLUCOSE 78 01/21/2021  ? ALT 16 07/24/2020  ? AST 19 07/24/2020  ? NA 141 01/21/2021  ? K 3.5 01/21/2021  ? CL 107 01/21/2021  ? CREATININE 0.69 01/21/2021  ? BUN 7 01/21/2021  ? CO2 26 01/21/2021  ? TSH 1.17 07/30/2020  ? INR 1.0 07/30/2019  ? HGBA1C 5.6 06/18/2015  ? ?Depression screen [facility] 2/9 04/03/2021  ?Decreased Interest 2  ?Down, Depressed, Hopeless 2  ?PHQ - 2 Score 4  ?Altered sleeping 2  ?Tired, decreased energy 3  ?Change in appetite 2  ?Feeling bad or failure about yourself  3  ?Trouble concentrating 2  ?Moving slowly or fidgety/restless 0  ?Suicidal thoughts 0  ?PHQ-9 Score 16  ?Difficult doing work/chores Very difficult  ?Some recent data might be hidden  ? ?GAD 7 : Generalized Anxiety Score 04/03/2021 05/26/2017  ?Nervous, Anxious, on Edge 2 1  ?Control/stop worrying 3 1  ?Worry too much - different things 3 1  ?Trouble relaxing 3 1  ?Restless 0 1  ?Easily annoyed or irritable 3 1  ?Afraid - awful might happen 2 1  ?Total GAD 7 Score 16 7  ?Anxiety Difficulty Very difficult -  ? ? ? ? ?Assessment/Plan: ? ?GAD (generalized anxiety disorder) ?-Increased anxiety symptoms. ?-GAD 7 score 16 ?-Discussed various medication options.  Patient willing to start medication. ?-Will start Zoloft 25 mg daily.  Advised may take 4-6 weeks to notice an improvement in symptoms at this dose. ?-Continue counseling ?- Plan: sertraline (ZOLOFT) 25 MG tablet ? ?Depression, major, single episode, moderate ([place])  ?-Increased ?-PHQ-9 score 16 ?-Discussed starting Zoloft 25 mg daily ?-Continue counseling. ?-Discussed other ways to help with mood including self-care, getting fresh air daily, exercise. ?- Plan: sertraline (ZOLOFT) 25 MG tablet ? ?Essential hypertension ?-Controlled ?-Continue current medications including Norvasc 10 mg daily ? 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 ?Need for influenza vaccination  ?- Plan: Flu Vaccine QUAD 6+ mos PF IM (Fluarix Quad PF) ? ?Need for shingles vaccine  ?- Plan: Varicella-zoster vaccine IM (Shingrix) ? ?F/u in 4-6 wks, sooner if needed ? ?Grier Mitts, MD ?

## 2021-04-04 DIAGNOSIS — Z1152 Encounter for screening for COVID-19: Secondary | ICD-10-CM | POA: Diagnosis not present

## 2021-04-06 ENCOUNTER — Encounter: Payer: Self-pay | Admitting: Family Medicine

## 2021-04-07 DIAGNOSIS — C109 Malignant neoplasm of oropharynx, unspecified: Secondary | ICD-10-CM | POA: Diagnosis not present

## 2021-04-07 DIAGNOSIS — C01 Malignant neoplasm of base of tongue: Secondary | ICD-10-CM | POA: Diagnosis not present

## 2021-04-07 DIAGNOSIS — C801 Malignant (primary) neoplasm, unspecified: Secondary | ICD-10-CM | POA: Diagnosis not present

## 2021-04-07 DIAGNOSIS — R1319 Other dysphagia: Secondary | ICD-10-CM | POA: Diagnosis not present

## 2021-04-10 DIAGNOSIS — Z1152 Encounter for screening for COVID-19: Secondary | ICD-10-CM | POA: Diagnosis not present

## 2021-04-13 ENCOUNTER — Encounter: Payer: Self-pay | Admitting: Specialist

## 2021-04-13 ENCOUNTER — Other Ambulatory Visit: Payer: Self-pay

## 2021-04-13 ENCOUNTER — Ambulatory Visit (INDEPENDENT_AMBULATORY_CARE_PROVIDER_SITE_OTHER): Payer: Medicare Other | Admitting: Specialist

## 2021-04-13 ENCOUNTER — Ambulatory Visit (INDEPENDENT_AMBULATORY_CARE_PROVIDER_SITE_OTHER): Payer: Medicare Other

## 2021-04-13 VITALS — BP 106/65 | HR 67 | Ht 67.0 in | Wt 158.0 lb

## 2021-04-13 DIAGNOSIS — M960 Pseudarthrosis after fusion or arthrodesis: Secondary | ICD-10-CM | POA: Diagnosis not present

## 2021-04-13 DIAGNOSIS — M4316 Spondylolisthesis, lumbar region: Secondary | ICD-10-CM

## 2021-04-13 DIAGNOSIS — T84498A Other mechanical complication of other internal orthopedic devices, implants and grafts, initial encounter: Secondary | ICD-10-CM

## 2021-04-13 DIAGNOSIS — M4325 Fusion of spine, thoracolumbar region: Secondary | ICD-10-CM

## 2021-04-13 NOTE — Progress Notes (Signed)
? ?Office Visit Note ?  ?Patient: Kristen [name]           ?Date of Birth: 1965/02/19           ?MRN: [id number] ?Visit Date: 04/13/2021 ?             ?Requested by: [name], MD ?[address] ?[place],  [place] [address] ?PCP: [name], MD ? ? ?Assessment & Plan: ?Visit Diagnoses:  ?1. Spondylolisthesis of lumbar region   ?2. Loosening of hardware in spine [facility])   ?3. Pseudarthrosis after fusion or arthrodesis   ?4. Fusion of spine of thoracolumbar region   ? ? ?Plan: Plan: Avoid bending, stooping and avoid lifting weights greater than 10 lbs. ?Avoid prolong standing and walking. ?Order for a new walker with wheels. ?Surgery scheduling secretary Kandice Hams, will call you in the next week to schedule for surgery.  ?Surgery recommended is a right one level lumbar fusion L3-4 revision of rods with extension of the posterior rods and screws to T12 to the this would be done with rods, screws and cages with local bone graft and allograft (donor bone graft). ?Take hydrocodone for for pain. ?Risk of surgery includes risk of infection 1 in 200 patients, bleeding 1/2% chance you would need a transfusion.   Risk to the nerves is one in 10,000. ?You will need to use a brace for 3 months and wean from the brace on the 4th month. ?Expect improved walking and standing tolerance. Expect relief of leg pain but numbness ?may persist depending on the length and degree of pressure that has been present. ? ?Follow-Up Instructions: No follow-ups on file.  ? ?Orders:  ?Orders Placed This Encounter  ?Procedures  ?? XR Lumbar Spine 2-3 Views  ? ?No orders of the defined types were placed in this encounter. ? ? ? ? Procedures: ?No procedures performed ? ? ?Clinical Data: ?No additional findings. ? ? ?Subjective: ?Chief Complaint  ?Patient presents with  ?? Lower Back - Follow-up  ? ? ?56 year old female returns today with improvement in her left hand post arthroplasty with the palmaris longus anchovie. She is  having  less back and leg pain and is scheduled for intervention 04/28/2021. She reports this is likely temporary and related to weather and decreased activity level. No bowel or bladder difficulty.  ? ?Review of Systems  ?Constitutional: Negative.   ?HENT: Negative.    ?Eyes: Negative.   ?Respiratory: Negative.    ?Cardiovascular: Negative.   ?Gastrointestinal: Negative.   ?Endocrine: Negative.   ?Genitourinary: Negative.   ?Musculoskeletal: Negative.   ?Skin: Negative.   ?Allergic/Immunologic: Negative.   ?Neurological: Negative.   ?Hematological: Negative.   ?Psychiatric/Behavioral: Negative.    ? ? ?Objective: ?Vital Signs: BP 106/65 (BP Location: Left Arm, Patient Position: Sitting)   Pulse 67   Ht 5' 7"  (1.702 m)   Wt 158 lb (71.7 kg)   LMP 11/15/2013   BMI 24.75 kg/m?  ? ?Physical Exam ?Musculoskeletal:  ?   Lumbar back: Negative right straight leg raise test and negative left straight leg raise test.  ? ?Back Exam  ? ?Tenderness  ?The patient is experiencing tenderness in the lumbar. ? ?Range of Motion  ?Extension:  abnormal  ?Flexion:  abnormal  ?Lateral bend right:  abnormal  ?Lateral bend left:  abnormal  ?Rotation right:  abnormal  ?Rotation left:  abnormal  ? ?Muscle Strength  ?Right Quadriceps:  5/5  ?Left Quadriceps:  5/5  ?Right Hamstrings:  5/5  ?Left Hamstrings:  5/5  ? ?Tests  ?Straight leg raise right: negative ?Straight leg raise left: negative ? ?Other  ?Toe walk: normal ?Heel walk: normal ?Erythema: no back redness ?Scars: present ? ?Comments:  Weak left hip flexor strength 4/5 ? ? ? ?Specialty Comments:  ?No specialty comments available. ? ?Imaging: ?No results found. ? ? ?PMFS History: ?Patient Active Problem List  ? Diagnosis Date Noted  ?? Spinal stenosis of lumbar region 01/20/2016  ?  Priority: High  ?  Class: Chronic  ?? DDD (degenerative disc disease), lumbar 01/20/2016  ?  Priority: High  ?  Class: Chronic  ?? Tenosynovitis, de Quervain 12/14/2020  ?? Arthritis of carpometacarpal  [facility]) joint of left thumb 12/14/2020  ?? Aortic atherosclerosis ([place]) 08/20/2020  ?? Hyperkalemia 07/24/2020  ?? Alcohol use 08/05/2019  ?? Bilateral lower extremity edema 08/05/2019  ?? Peripheral edema 03/12/2019  ?? Iron deficiency anemia 12/11/2018  ?? Loosening of hardware in spine [facility])   ?? Other spondylosis with radiculopathy, lumbar region   ?? History of lumbar spinal fusion 11/06/2018  ?? UTI (urinary tract infection) 05/15/2017  ?? 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 Malignant neoplasm of base of tongue ([place]) 10/29/2016  ?? 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[place]) 10/29/2016  ?? Headache associated with sexual activity 05/14/2016  ?? Tongue lesion 05/14/2016  ?? Thrombocytopenia ([place]) 01/22/2016  ?? Chronic diarrhea 01/22/2016  ?? Chest pain   ?? Essential hypertension   ?? Spinal stenosis of lumbar region with neurogenic claudication 01/20/2016  ?? Low back pain 12/22/2015  ?? Hypersomnolence 10/13/2015  ?? Muscle cramp 08/01/2015  ?? GERD (gastroesophageal reflux disease) 07/09/2015  ?? Morbid obesity with BMI of 40.0-44.9, adult ([place]) 01/08/2013  ? ?Past Medical History:  ?Diagnosis Date  ?? Allergy   ?? Anemia   ? Iron deficiency  ?? Anxiety   ?? Arthritis   ? back, left shoulder  ?? Blood transfusion without reported diagnosis   ?? Chicken pox   ?? Depression   ?? Diarrhea   ? takes Imodium daily  ?? GERD (gastroesophageal reflux disease)   ?? H/O hiatal hernia   ?? Headache(784.0)   ?? History of kidney stones   ? 1996ish  ?? History of radiation therapy 11/16/16- 01/01/17  ? Base of Tongue/ 66 gy in 33 fractions/ Dose: 2 Gy  ?? Hypertension   ?? Iron deficiency anemia 12/11/2018  ?? Migraine   ? none for 5 years (as of 01/13/16)  ?? OSA (obstructive sleep apnea) 10/13/2015  ? unable to get cpap, plans to get one in 2018  ?? Pneumonia   ?? Restless legs   ?? Scoliosis   ?? Shingles 08/28/2013  ?? Shortness of breath   ? with exertion  ?? Sleep apnea   ?? Tongue cancer  ([place])   ? tongue cancer  ?  ?Family History  ?Problem Relation Age of Onset  ?? Heart disease Father   ?? Heart attack Father   ?? Hypertension Mother   ?? Arthritis Mother   ?? Diabetes Maternal Grandmother   ?? Diabetes Paternal Grandmother   ?? Diabetes Maternal Uncle   ?? Colon polyps Neg Hx   ?? Colon cancer Neg Hx   ?? Esophageal cancer Neg Hx   ?? Stomach cancer Neg Hx   ?? Rectal cancer Neg Hx   ?  ?Past Surgical History:  ?Procedure Laterality Date  ?? BACK SURGERY  07/21/1978  ?? CARPOMETACARPEL SUSPENSION PLASTY Left  01/21/2021  ? Procedure: Left Thumb Ligament Carpometacarpal Arthroplasty;  Surgeon: Sherilyn Cooter, MD;  Location: [facility];  Service: Orthopedics;  Laterality: Left;  ?? CHOLECYSTECTOMY N/A 08/15/2013  ? Procedure: LAPAROSCOPIC CHOLECYSTECTOMY WITH INTRAOPERATIVE CHOLANGIOGRAM;  Surgeon: Gwenyth Ober, MD;  Location: [place];  Service: General;  Laterality: N/A;  ?? COLONOSCOPY N/A 01/09/2013  ? Procedure: COLONOSCOPY;  Surgeon: Beryle Beams, MD;  Location: [place];  Service: Endoscopy;  Laterality: N/A;  ?? COLONOSCOPY  03/2018  ?? DIRECT LARYNGOSCOPY  07/2016  ? Dr. Nicolette Bang [facility]  ?? ESOPHAGOGASTRODUODENOSCOPY  03/2018  ?? ESOPHAGOGASTRODUODENOSCOPY (EGD) WITH PROPOFOL N/A 04/24/2019  ? Procedure: ESOPHAGOGASTRODUODENOSCOPY (EGD) WITH PROPOFOL;  Surgeon: Gatha Mayer, MD;  Location[facility];  Service: Endoscopy;  Laterality: N/A;  ?? GASTROSTOMY TUBE PLACEMENT  09/27/2016  ?? GIVENS CAPSULE STUDY N/A 04/24/2019  ? Procedure: GIVENS CAPSULE STUDY;  Surgeon: Gatha Mayer, MD;  [facility];  Service: Endoscopy;  Laterality: N/A;  ?? HERNIA REPAIR Left 1981  ?? IR PATIENT EVAL TECH 0-60 MINS  12/13/2016  ?? IR PATIENT EVAL TECH 0-60 MINS  04/11/2017  ?? IR PATIENT EVAL TECH 0-60 MINS  03/24/2018  ?? IR PATIENT EVAL TECH 0-60 MINS  11/23/2018  ?? IR PATIENT EVAL TECH 0-60 MINS  05/28/2019  ?? IR REPLACE G-TUBE SIMPLE WO FLUORO  12/12/2017  ?? IR REPLACE G-TUBE SIMPLE WO FLUORO   03/29/2018  ?? IR REPLACE G-TUBE SIMPLE WO FLUORO  06/29/2018  ?? IR REPLACE G-TUBE SIMPLE WO FLUORO  11/02/2018  ?? IR REPLACE G-TUBE SIMPLE WO FLUORO  03/08/2019  ?? IR REPLACE G-TUBE SIMPLE WO FLUORO  07/02/2019

## 2021-04-13 NOTE — Patient Instructions (Signed)
Plan: Avoid bending, stooping and avoid lifting weights greater than 10 lbs. ?Avoid prolong standing and walking. ?Order for a new walker with wheels. ?Surgery scheduling secretary Kandice Hams, will call you in the next week to schedule for surgery.  ?Surgery recommended is a right one level lumbar fusion L3-4 revision of rods with extension of the posterior rods and screws to T12 to the this would be done with rods, screws and cages with local bone graft and allograft (donor bone graft). ?Take hydrocodone for for pain. ?Risk of surgery includes risk of infection 1 in 200 patients, bleeding 1/2% chance you would need a transfusion.   Risk to the nerves is one in 10,000. ?You will need to use a brace for 3 months and wean from the brace on the 4th month. ?Expect improved walking and standing tolerance. Expect relief of leg pain but numbness ?may persist depending on the length and degree of pressure that has been present. ?

## 2021-04-16 ENCOUNTER — Encounter: Payer: Self-pay | Admitting: Surgery

## 2021-04-16 ENCOUNTER — Ambulatory Visit (INDEPENDENT_AMBULATORY_CARE_PROVIDER_SITE_OTHER): Payer: Medicare Other | Admitting: Surgery

## 2021-04-16 VITALS — BP 106/68 | HR 73

## 2021-04-16 DIAGNOSIS — M960 Pseudarthrosis after fusion or arthrodesis: Secondary | ICD-10-CM

## 2021-04-16 NOTE — Progress Notes (Signed)
56 year old white female with history of lumbar pseudoarthrosis and hardware failure comes in for prep evaluation.  States that pain unchanged from previous visit and she is wanting to proceed with REMOVAL OF RODS AND SCREWS L3 TO S1, RIGHT L3-4 LUMBAR INTERBODY FUSION,  INSTRUMENTATION EXTENDED TO T12 LEVEL, PEDICLE SCREWS, RODS, CAGE, LOCAL BONE GRAFT, ALLOGRAFT, BMP as scheduled.  Today history and physical performed.  Review of systems negative.  Patient states that Dr. Louanne Skye discussed procedure in detail the other day.  All questions answered. ?

## 2021-04-20 DIAGNOSIS — M960 Pseudarthrosis after fusion or arthrodesis: Secondary | ICD-10-CM

## 2021-04-20 NOTE — Pre-Procedure Instructions (Signed)
Surgical Instructions ? ? ? Your procedure is scheduled on Tuesday, April 11th. ? Report to [facility] Main Entrance "A" at 05:30 A.M., then check in with the Admitting office. ? Call this number if you have problems the morning of surgery: ? [phone] ? ? If you have any questions prior to your surgery date call [phone]: Open Monday-Friday 8am-4pm ? ? ? Remember: ? Do not eat after midnight the night before your surgery ? ?You may drink clear liquids until 04:30 AM the morning of your surgery.   ?Clear liquids allowed are: Water, Non-Citrus Juices (without pulp), Carbonated Beverages, Clear Tea, Black Coffee Only (NO MILK, CREAM OR POWDERED CREAMER of any kind), and Gatorade. ? ? ?Patient Instructions ? ?The night before surgery:  ?No food after midnight. ONLY clear liquids after midnight ? ?The day of surgery (if you do NOT have diabetes):  ?Drink ONE (1) Pre-Surgery Clear Ensure by 04:30 AM the morning of surgery. Drink in one sitting. Do not sip.  ?This drink was given to you during your hospital  ?pre-op appointment visit. ? ?Nothing else to drink after completing the  ?Pre-Surgery Clear Ensure. ? ? ?       If you have questions, please contact your surgeon?s office.  ? ?  ? Take these medicines the morning of surgery with A SIP OF WATER  ?amLODipine (NORVASC)  ?baclofen (LIORESAL) ?gabapentin (NEURONTIN) ?pregabalin (LYRICA) ?sertraline (ZOLOFT) ? ?If needed: ?clotrimazole [facility]) ?traMADol Veatrice Bourbon) ? ? ?Follow your surgeon's instructions on when to stop Aspirin.  If no instructions were given by your surgeon then you will need to call the office to get those instructions.    ? ? ?As of today, STOP taking any Aspirin (unless otherwise instructed by your surgeon) Aleve, Naproxen, Ibuprofen, Motrin, Advil, Goody's, BC's, all herbal medications, fish oil, and all vitamins. This includes your diclofenac (VOLTAREN). ?         ?           ?Do NOT Smoke (Tobacco/Vaping) for 24 hours prior to your  procedure. ? ?If you use a CPAP at night, you may bring your mask/headgear for your overnight stay. ?  ?Contacts, glasses, piercing's, hearing aid's, dentures or partials may not be worn into surgery, please bring cases for these belongings.  ?  ?For patients admitted to the hospital, discharge time will be determined by your treatment team. ?  ?Patients discharged the day of surgery will not be allowed to drive home, and someone needs to stay with them for 24 hours. ? ?SURGICAL WAITING ROOM VISITATION ?Patients having surgery or a procedure may have two support people in the waiting room. These visitors may be switched out with other visitors if needed. ?Children under the age of 54 must have an adult accompany them who is not the patient. ?If the patient needs to stay at the hospital during part of their recovery, the visitor guidelines for inpatient rooms apply. ? ?Please refer to the [facility] website for the visitor guidelines for Inpatients (after your surgery is over and you are in a regular room).  ? ? ?Special instructions:   ?[place]- Preparing For Surgery ? ?Before surgery, you can play an important role. Because skin is not sterile, your skin needs to be as free of germs as possible. You can reduce the number of germs on your skin by washing with CHG (chlorahexidine gluconate) Soap before surgery.  CHG is an antiseptic cleaner which kills germs and bonds with the skin to  continue killing germs even after washing.   ? ?Oral Hygiene is also important to reduce your risk of infection.  Remember - BRUSH YOUR TEETH THE MORNING OF SURGERY WITH YOUR REGULAR TOOTHPASTE ? ?Please do not use if you have an allergy to CHG or antibacterial soaps. If your skin becomes reddened/irritated stop using the CHG.  ?Do not shave (including legs and underarms) for at least 48 hours prior to first CHG shower. It is OK to shave your face. ? ?Please follow these instructions carefully. ?  ?Shower the NIGHT BEFORE SURGERY  and the MORNING OF SURGERY ? ?If you chose to wash your hair, wash your hair first as usual with your normal shampoo. ? ?After you shampoo, rinse your hair and body thoroughly to remove the shampoo. ? ?Use CHG Soap as you would any other liquid soap. You can apply CHG directly to the skin and wash gently with a scrungie or a clean washcloth.  ? ?Apply the CHG Soap to your body ONLY FROM THE NECK DOWN.  Do not use on open wounds or open sores. Avoid contact with your eyes, ears, mouth and genitals (private parts). Wash Face and genitals (private parts)  with your normal soap.  ? ?Wash thoroughly, paying special attention to the area where your surgery will be performed. ? ?Thoroughly rinse your body with warm water from the neck down. ? ?DO NOT shower/wash with your normal soap after using and rinsing off the CHG Soap. ? ?Pat yourself dry with a CLEAN TOWEL. ? ?Wear CLEAN PAJAMAS to bed the night before surgery ? ?Place CLEAN SHEETS on your bed the night before your surgery ? ?DO NOT SLEEP WITH PETS. ? ? ?Day of Surgery: ?Take a shower with CHG soap. ?Do not wear jewelry or makeup ?Do not wear lotions, powders, perfumes, or deodorant. ?Do not shave 48 hours prior to surgery.   ?Do not bring valuables to the hospital. [facility] is not responsible for any belongings or valuables. ?Do not wear nail polish, gel polish, artificial nails, or any other type of covering on natural nails (fingers and toes) ?If you have artificial nails or gel coating that need to be removed by a nail salon, please have this removed prior to surgery. Artificial nails or gel coating may interfere with anesthesia's ability to adequately monitor your vital signs. ? ?Wear Clean/Comfortable clothing the morning of surgery ?Remember to brush your teeth WITH YOUR REGULAR TOOTHPASTE. ?  ?Please read over the following fact sheets that you were given. ? ? ? ?If you received a COVID test during your pre-op visit  it is requested that you wear a mask  when out in public, stay away from anyone that may not be feeling well and notify your surgeon if you develop symptoms. If you have been in contact with anyone that has tested positive in the last 10 days please notify you surgeon.  ?

## 2021-04-21 ENCOUNTER — Ambulatory Visit (HOSPITAL_COMMUNITY)
Admission: RE | Admit: 2021-04-21 | Discharge: 2021-04-21 | Disposition: A | Discharge status: Home / Self Care | Payer: Medicare Other | Source: Ambulatory Visit | Attending: Surgery | Admitting: Surgery

## 2021-04-21 ENCOUNTER — Encounter (HOSPITAL_COMMUNITY)
Admission: RE | Admit: 2021-04-21 | Discharge: 2021-04-21 | Disposition: A | Discharge status: Home / Self Care | Payer: Medicare Other | Source: Ambulatory Visit | Attending: Specialist | Admitting: Specialist

## 2021-04-21 ENCOUNTER — Encounter (HOSPITAL_COMMUNITY): Payer: Self-pay

## 2021-04-21 ENCOUNTER — Encounter: Payer: Self-pay | Admitting: Family Medicine

## 2021-04-21 ENCOUNTER — Other Ambulatory Visit: Payer: Self-pay

## 2021-04-21 ENCOUNTER — Encounter: Payer: Self-pay | Admitting: Specialist

## 2021-04-21 VITALS — BP 113/83 | HR 64 | Temp 98.8°F | Resp 17 | Ht 67.0 in | Wt 154.1 lb

## 2021-04-21 DIAGNOSIS — M960 Pseudarthrosis after fusion or arthrodesis: Secondary | ICD-10-CM | POA: Diagnosis not present

## 2021-04-21 DIAGNOSIS — Z01818 Encounter for other preprocedural examination: Secondary | ICD-10-CM

## 2021-04-21 DIAGNOSIS — Z8701 Personal history of pneumonia (recurrent): Secondary | ICD-10-CM | POA: Insufficient documentation

## 2021-04-21 LAB — CBC
HCT: 36.5 % (ref 36.0–46.0)
Hemoglobin: 11.2 g/dL — ABNORMAL LOW (ref 12.0–15.0)
MCH: 27.8 pg (ref 26.0–34.0)
MCHC: 30.7 g/dL (ref 30.0–36.0)
MCV: 90.6 fL (ref 80.0–100.0)
Platelets: 233 10*3/uL (ref 150–400)
RBC: 4.03 MIL/uL (ref 3.87–5.11)
RDW: 14.4 % (ref 11.5–15.5)
WBC: 3.5 10*3/uL — ABNORMAL LOW (ref 4.0–10.5)
nRBC: 0 % (ref 0.0–0.2)

## 2021-04-21 LAB — TYPE AND SCREEN
ABO/RH(D): B POS
Antibody Screen: NEGATIVE

## 2021-04-21 LAB — COMPREHENSIVE METABOLIC PANEL
ALT: 27 U/L (ref 0–44)
AST: 26 U/L (ref 15–41)
Albumin: 3.8 g/dL (ref 3.5–5.0)
Alkaline Phosphatase: 135 U/L — ABNORMAL HIGH (ref 38–126)
Anion gap: 10 (ref 5–15)
BUN: 9 mg/dL (ref 6–20)
CO2: 25 mmol/L (ref 22–32)
Calcium: 9.2 mg/dL (ref 8.9–10.3)
Chloride: 105 mmol/L (ref 98–111)
Creatinine, Ser: 0.71 mg/dL (ref 0.44–1.00)
GFR, Estimated: >60 mL/min (ref >60)
Glucose, Bld: 89 mg/dL (ref 70–99)
Potassium: 3.3 mmol/L — ABNORMAL LOW (ref 3.5–5.1)
Sodium: 140 mmol/L (ref 135–145)
Total Bilirubin: 0.9 mg/dL (ref 0.3–1.2)
Total Protein: 6.7 g/dL (ref 6.5–8.1)

## 2021-04-21 LAB — URINALYSIS, ROUTINE W REFLEX MICROSCOPIC
Bilirubin Urine: NEGATIVE
Glucose, UA: NEGATIVE mg/dL
Hgb urine dipstick: NEGATIVE
Ketones, ur: NEGATIVE mg/dL
Leukocytes,Ua: NEGATIVE
Nitrite: NEGATIVE
Protein, ur: NEGATIVE mg/dL
Specific Gravity, Urine: 1.009 (ref 1.005–1.030)
pH: 6 (ref 5.0–8.0)

## 2021-04-21 LAB — PROTIME-INR
INR: 1.2 (ref 0.8–1.2)
Prothrombin Time: 14.9 seconds (ref 11.4–15.2)

## 2021-04-21 LAB — SURGICAL PCR SCREEN
MRSA, PCR: NEGATIVE
Staphylococcus aureus: POSITIVE — AB

## 2021-04-21 NOTE — Progress Notes (Signed)
PCP - Grier Mitts MD ?Cardiologist - I was I haven't seen him in a while Dr. Roque Cash at [facility] in [facility], just for a routine physical, per patient.  ? ?Chest x-ray - 04/21/21 ?EKG - 04/21/21 ?Stress Test - Denies ?ECHO - 01/23/16 ?Cardiac Cath -Denies  ? ?Sleep Study - Yes No OSA ? ?DM - Denies ? ?Aspirin Instructions: Asked patient to call Dr. Otho Ket office to ask when to stop taking.  ? ?ERAS Protcol -Yes ?PRE-SURGERY Ensure   ? ?COVID TEST- Not indicated ? ? ?Anesthesia review: No ? ?Patient denies shortness of breath, fever, cough and chest pain at PAT appointment ? ? ?All instructions explained to the patient, with a verbal understanding of the material. Patient agrees to go over the instructions while at home for a better understanding.The opportunity to ask questions was provided. ? ? ?

## 2021-04-21 NOTE — Progress Notes (Signed)
Surgical Instructions ?  ?  ?            Your procedure is scheduled on Tuesday, April 11th. ?            Report to [facility] Main Entrance "A" at 05:30 A.M., then check in with the Admitting office. ?            Call this number if you have problems the morning of surgery: ?            [phone] ?  ? If you have any questions prior to your surgery date call [phone]: Open Monday-Friday 8am-4pm ?  ?  ?            Remember: ?            Do not eat after midnight the night before your surgery ?  ?You may drink clear liquids until 04:30 AM the morning of your surgery.   ?Clear liquids allowed are: Water, Non-Citrus Juices (without pulp), Carbonated Beverages, Clear Tea, Black Coffee Only (NO MILK, CREAM OR POWDERED CREAMER of any kind), and Gatorade. ?  ?  ?Patient Instructions ?  ?The night before surgery:  ?No food after midnight. ONLY clear liquids after midnight ?  ?The day of surgery (if you do NOT have diabetes):  ?Drink ONE (1) Pre-Surgery Clear Ensure by 04:30 AM the morning of surgery. Drink in one sitting. Do not sip.  ?This drink was given to you during your hospital  ?pre-op appointment visit. ?  ?Nothing else to drink after completing the  ?Pre-Surgery Clear Ensure. ?  ?  ?       If you have questions, please contact your surgeon?s office.  ?  ?             ?            Take these medicines the morning of surgery with A SIP OF WATER  ?amLODipine (NORVASC)  ?baclofen (LIORESAL) ?DEXILANT 60 MG capsule ?gabapentin (NEURONTIN) ?pregabalin (LYRICA) ?sertraline (ZOLOFT) ?  ?If needed: ?clotrimazole [facility]) ?traMADol Veatrice Bourbon) ?  ?  ?Follow your surgeon's instructions on when to stop Aspirin.  If no instructions were given by your surgeon then you will need to call the office to get those instructions.    ?  ?  ?As of today, STOP taking any Aspirin (unless otherwise instructed by your surgeon) Voltaren, Aleve, Naproxen, Ibuprofen, Motrin, Advil, Goody's, BC's, all herbal medications, fish oil, and all  vitamins. This includes your diclofenac (VOLTAREN). ?         ?           ?Do NOT Smoke (Tobacco/Vaping) for 24 hours prior to your procedure. ?  ?If you use a CPAP at night, you may bring your mask/headgear for your overnight stay. ?  ?Contacts, glasses, piercing's, hearing aid's, dentures or partials may not be worn into surgery, please bring cases for these belongings.  ?  ?For patients admitted to the hospital, discharge time will be determined by your treatment team. ?  ?Patients discharged the day of surgery will not be allowed to drive home, and someone needs to stay with them for 24 hours. ?  ?SURGICAL WAITING ROOM VISITATION ?Patients having surgery or a procedure may have two support people in the waiting room. These visitors may be switched out with other visitors if needed. ?Children under the age of 43 must have an adult accompany them who is not the patient. ?If the patient  needs to stay at the hospital during part of their recovery, the visitor guidelines for inpatient rooms apply. ?  ?Please refer to the [facility] website for the visitor guidelines for Inpatients (after your surgery is over and you are in a regular room).  ?  ?  ?Special instructions:   ?[facility]- Preparing For Surgery ?  ?Before surgery, you can play an important role. Because skin is not sterile, your skin needs to be as free of germs as possible. You can reduce the number of germs on your skin by washing with CHG (chlorahexidine gluconate) Soap before surgery.  CHG is an antiseptic cleaner which kills germs and bonds with the skin to continue killing germs even after washing.   ?  ?Oral Hygiene is also important to reduce your risk of infection.  Remember - BRUSH YOUR TEETH THE MORNING OF SURGERY WITH YOUR REGULAR TOOTHPASTE ?  ?Please do not use if you have an allergy to CHG or antibacterial soaps. If your skin becomes reddened/irritated stop using the CHG.  ?Do not shave (including legs and underarms) for at least 48 hours  prior to first CHG shower. It is OK to shave your face. ?  ?Please follow these instructions carefully. ?                                                                                                                              ?Shower the NIGHT BEFORE SURGERY and the MORNING OF SURGERY ?  ?If you chose to wash your hair, wash your hair first as usual with your normal shampoo. ?  ?After you shampoo, rinse your hair and body thoroughly to remove the shampoo. ?  ?Use CHG Soap as you would any other liquid soap. You can apply CHG directly to the skin and wash gently with a scrungie or a clean washcloth.  ?  ?Apply the CHG Soap to your body ONLY FROM THE NECK DOWN.  Do not use on open wounds or open sores. Avoid contact with your eyes, ears, mouth and genitals (private parts). Wash Face and genitals (private parts)  with your normal soap.  ?  ?Wash thoroughly, paying special attention to the area where your surgery will be performed. ?  ?Thoroughly rinse your body with warm water from the neck down. ?  ?DO NOT shower/wash with your normal soap after using and rinsing off the CHG Soap. ?  ?Pat yourself dry with a CLEAN TOWEL. ?  ?Wear CLEAN PAJAMAS to bed the night before surgery ?  ?Place CLEAN SHEETS on your bed the night before your surgery ?  ?DO NOT SLEEP WITH PETS. ?  ?  ?Day of Surgery: ?Take a shower with CHG soap. ?Do not wear jewelry or makeup ?Do not wear lotions, powders, perfumes, or deodorant. ?Do not shave 48 hours prior to surgery.   ?Do not bring valuables to the hospital. [facility] is not responsible for any belongings or valuables. ?Do  not wear nail polish, gel polish, artificial nails, or any other type of covering on natural nails (fingers and toes) ?If you have artificial nails or gel coating that need to be removed by a nail salon, please have this removed prior to surgery. Artificial nails or gel coating may interfere with anesthesia's ability to adequately monitor your vital signs. ?  ?Wear  Clean/Comfortable clothing the morning of surgery ?Remember to brush your teeth WITH YOUR REGULAR TOOTHPASTE. ?  ?Please read over the following fact sheets that you were given. ?  ?  ?  ?If you received a COVID test during your pre-op visit  it is requested that you wear a mask when out in public, stay away from anyone that may not be feeling well and notify your surgeon if you develop symptoms. If you have been in contact with anyone that has tested positive in the last 10 days please notify you surgeon.   ?  ?  ?  ? ?

## 2021-04-21 NOTE — H&P (Signed)
Kristen [name] is an 56 y.o. female.   ?Chief Complaint: back pain ?HPI: 56 year old white female with history of lumbar pseudoarthrosis and hardware failure comes in for prep evaluation.  States that pain unchanged from previous visit and she is wanting to proceed with REMOVAL OF RODS AND SCREWS L3 TO S1, RIGHT L3-4 LUMBAR INTERBODY FUSION,  INSTRUMENTATION EXTENDED TO T12 LEVEL, PEDICLE SCREWS, RODS, CAGE, LOCAL BONE GRAFT, ALLOGRAFT, BMP as scheduled.  Today history and physical performed.  Review of systems negative. ? ?Past Medical History:  ?Diagnosis Date  ? Allergy   ? Anemia   ? Iron deficiency  ? Anxiety   ? Arthritis   ? back, left shoulder  ? Blood transfusion without reported diagnosis   ? Chicken pox   ? Depression   ? Diarrhea   ? takes Imodium daily  ? GERD (gastroesophageal reflux disease)   ? H/O hiatal hernia   ? Headache(784.0)   ? History of kidney stones   ? 1996ish  ? History of radiation therapy 11/16/16- 01/01/17  ? Base of Tongue/ 66 gy in 33 fractions/ Dose: 2 Gy  ? Hypertension   ? Iron deficiency anemia 12/11/2018  ? Migraine   ? none for 5 years (as of 01/13/16)  ? OSA (obstructive sleep apnea) 10/13/2015  ? unable to get cpap, plans to get one in 2018  ? Pneumonia   ? Restless legs   ? Scoliosis   ? Shingles 08/28/2013  ? Shortness of breath   ? with exertion  ? Sleep apnea   ? Tongue cancer ([place])   ? tongue cancer  ? ? ?Past Surgical History:  ?Procedure Laterality Date  ? BACK SURGERY  07/21/1978  ? CARPOMETACARPEL SUSPENSION PLASTY Left 01/21/2021  ? Procedure: Left Thumb Ligament Carpometacarpal Arthroplasty;  Surgeon: Sherilyn Cooter, MD;  Location: [facility];  Service: Orthopedics;  Laterality: Left;  ? CHOLECYSTECTOMY N/A 08/15/2013  ? Procedure: LAPAROSCOPIC CHOLECYSTECTOMY WITH INTRAOPERATIVE CHOLANGIOGRAM;  Surgeon: Gwenyth Ober, MD;  Location: [facility];  Service: General;  Laterality: N/A;  ? COLONOSCOPY N/A 01/09/2013  ? Procedure: COLONOSCOPY;  Surgeon: Beryle Beams, MD;   Location: [facility];  Service: Endoscopy;  Laterality: N/A;  ? COLONOSCOPY  03/2018  ? DIRECT LARYNGOSCOPY  07/2016  ? [facility]  ? ESOPHAGOGASTRODUODENOSCOPY  03/2018  ? ESOPHAGOGASTRODUODENOSCOPY (EGD) WITH PROPOFOL N/A 04/24/2019  ? Procedure: ESOPHAGOGASTRODUODENOSCOPY (EGD) WITH PROPOFOL;  Surgeon: Gatha Mayer, MD;  Location: [facility];  Service: Endoscopy;  Laterality: N/A;  ? GASTROSTOMY TUBE PLACEMENT  09/27/2016  ? GIVENS CAPSULE STUDY N/A 04/24/2019  ? Procedure: GIVENS CAPSULE STUDY;  Surgeon: Gatha Mayer, MD;  Location: [facility];  Service: Endoscopy;  Laterality: N/A;  ? HERNIA REPAIR Left 1981  ? IR PATIENT EVAL TECH 0-60 MINS  12/13/2016  ? IR PATIENT EVAL TECH 0-60 MINS  04/11/2017  ? IR PATIENT EVAL TECH 0-60 MINS  03/24/2018  ? IR PATIENT EVAL TECH 0-60 MINS  11/23/2018  ? IR PATIENT EVAL TECH 0-60 MINS  05/28/2019  ? IR REPLACE G-TUBE SIMPLE WO FLUORO  12/12/2017  ? IR REPLACE G-TUBE SIMPLE WO FLUORO  03/29/2018  ? IR REPLACE G-TUBE SIMPLE WO FLUORO  06/29/2018  ? IR REPLACE G-TUBE SIMPLE WO FLUORO  11/02/2018  ? IR REPLACE G-TUBE SIMPLE WO FLUORO  03/08/2019  ? IR REPLACE G-TUBE SIMPLE WO FLUORO  07/02/2019  ? IR REPLACE G-TUBE SIMPLE WO FLUORO  11/30/2019  ? IR REPLACE G-TUBE SIMPLE WO FLUORO  12/27/2019  ? IR REPLACE G-TUBE SIMPLE WO FLUORO  05/27/2020  ? IR REPLACE G-TUBE SIMPLE WO FLUORO  07/09/2020  ? MODIFIED RADICAL NECK DISSECTION Left 09/27/2016  ? Levels 1 & 2  ? PARTIAL GLOSSECTOMY Left 09/27/2016  ? Left hemi partial glossectomy  ? SPINE SURGERY  01/20/2016  ? fusion  ? TONSILLECTOMY    ? tracheotomy  09/27/2016  ? TUBAL LIGATION  06/1988  ? ? ?Family History  ?Problem Relation Age of Onset  ? Heart disease Father   ? Heart attack Father   ? Hypertension Mother   ? Arthritis Mother   ? Diabetes Maternal Grandmother   ? Diabetes Paternal Grandmother   ? Diabetes Maternal Uncle   ? Colon polyps Neg Hx   ? Colon cancer Neg Hx   ? Esophageal cancer Neg Hx   ? Stomach cancer Neg Hx    ? Rectal cancer Neg Hx   ? ?Social History:  reports that she has never smoked. She has never used smokeless tobacco. She reports current alcohol use of about 7.0 standard drinks per week. She reports that she does not use drugs. ? ?Allergies:  ?Allergies  ?Allergen Reactions  ? Other Hives and Swelling  ?  tomato  ? Sulfa Antibiotics Hives  ? ? ?No medications prior to admission.  ? ? ?Results for orders placed or performed during the hospital encounter of 04/21/21 (from the past 48 hour(s))  ?Surgical pcr screen     Status: Abnormal  ? Collection Time: 04/21/21  9:30 AM  ? Specimen: Nasal Mucosa; Nasal Swab  ?Result Value Ref Range  ? MRSA, PCR NEGATIVE NEGATIVE  ? Staphylococcus aureus POSITIVE (A) NEGATIVE  ?  Comment: (NOTE) ?The Xpert SA Assay (FDA approved for NASAL specimens in patients 71 ?years of age and older), is one component of a comprehensive ?surveillance program. It is not intended to diagnose infection nor to ?guide or monitor treatment. ?Performed at [facility], [place] [address], [place], [place] ?[address] ?  ?Urinalysis, Routine w reflex microscopic     Status: None  ? Collection Time: 04/21/21  9:31 AM  ?Result Value Ref Range  ? Color, Urine YELLOW YELLOW  ? APPearance CLEAR CLEAR  ? Specific Gravity, Urine 1.009 1.005 - 1.030  ? pH 6.0 5.0 - 8.0  ? Glucose, UA NEGATIVE NEGATIVE mg/dL  ? Hgb urine dipstick NEGATIVE NEGATIVE  ? Bilirubin Urine NEGATIVE NEGATIVE  ? Ketones, ur NEGATIVE NEGATIVE mg/dL  ? Protein, ur NEGATIVE NEGATIVE mg/dL  ? Nitrite NEGATIVE NEGATIVE  ? Leukocytes,Ua NEGATIVE NEGATIVE  ?  Comment: Performed at [facility], [place] [address], [place], [place] [address]  ?Type and screen [place]     Status: None  ? Collection Time: 04/21/21  9:57 AM  ?Result Value Ref Range  ? ABO/RH(D) B POS   ? Antibody Screen NEG   ? Sample Expiration 05/05/2021,2359   ? Extend sample reason    ?  NO TRANSFUSIONS OR PREGNANCY IN THE PAST 3 MONTHS ?Performed at  [facility], [place] [address] [place], [place] [address] ?  ?CBC     Status: Abnormal  ? Collection Time: 04/21/21 10:00 AM  ?Result Value Ref Range  ? WBC 3.5 (L) 4.0 - 10.5 K/uL  ? RBC 4.03 3.87 - 5.11 MIL/uL  ? Hemoglobin 11.2 (L) 12.0 - 15.0 g/dL  ? HCT 36.5 36.0 - 46.0 %  ? MCV 90.6 80.0 - 100.0 fL  ?  MCH 27.8 26.0 - 34.0 pg  ? MCHC 30.7 30.0 - 36.0 g/dL  ? RDW 14.4 11.5 - 15.5 %  ? Platelets 233 150 - 400 K/uL  ? nRBC 0.0 0.0 - 0.2 %  ?  Comment: Performed at [facility], [place] [address] [place], [place] [address]  ?Comprehensive metabolic panel     Status: Abnormal  ? Collection Time: 04/21/21 10:00 AM  ?Result Value Ref Range  ? Sodium 140 135 - 145 mmol/L  ? Potassium 3.3 (L) 3.5 - 5.1 mmol/L  ? Chloride 105 98 - 111 mmol/L  ? CO2 25 22 - 32 mmol/L  ? Glucose, Bld 89 70 - 99 mg/dL  ?  Comment: Glucose reference range applies only to samples taken after fasting for at least 8 hours.  ? BUN 9 6 - 20 mg/dL  ? Creatinine, Ser 0.71 0.44 - 1.00 mg/dL  ? Calcium 9.2 8.9 - 10.3 mg/dL  ? Total Protein 6.7 6.5 - 8.1 g/dL  ? Albumin 3.8 3.5 - 5.0 g/dL  ? AST 26 15 - 41 U/L  ? ALT 27 0 - 44 U/L  ? Alkaline Phosphatase 135 (H) 38 - 126 U/L  ? Total Bilirubin 0.9 0.3 - 1.2 mg/dL  ? GFR, Estimated >60 >60 mL/min  ?  Comment: (NOTE) ?Calculated using the CKD-EPI Creatinine Equation (2021) ?  ? Anion gap 10 5 - 15  ?  Comment: Performed at [facility], [place] [address], [place], [place] [address]  ?Protime-INR     Status: None  ? Collection Time: 04/21/21 10:00 AM  ?Result Value Ref Range  ? Prothrombin Time 14.9 11.4 - 15.2 seconds  ? INR 1.2 0.8 - 1.2  ?  Comment: (NOTE) ?INR goal varies based on device and disease states. ?Performed at [facility], [place] [address] [place], [place] ?[address] ?  ? ?*Note: Due to a large number of results and/or encounters for the requested time period, some results have not been displayed. A complete set of results can be found in Results Review.  ? ?DG Chest 2  View ? ?Result Date: 04/21/2021 ?CLINICAL DATA:  56 year old female with preoperative chest x-ray for back surgery EXAM: CHEST - 2 VIEW COMPARISON:  01/13/2021 FINDINGS: Cardiomediastinal silhouette unchanged

## 2021-04-24 ENCOUNTER — Encounter: Payer: Self-pay | Admitting: Family Medicine

## 2021-04-27 NOTE — Anesthesia Preprocedure Evaluation (Addendum)
Anesthesia Evaluation  ?Patient identified by MRN, date of birth, ID band ?Patient awake ? ? ? ?Reviewed: ?Allergy & Precautions, NPO status , Patient's Chart, lab work & pertinent test results ? ?Airway ?Mallampati: II ? ?TM Distance: >3 FB ?Neck ROM: Full ? ? ? Dental ? ?(+) Edentulous Upper, Edentulous Lower, Dental Advisory Given ?  ?Pulmonary ?sleep apnea ,  ?  ?Pulmonary exam normal ? ? ? ? ? ? ? Cardiovascular ?hypertension, Pt. on medications ? ?Rhythm:Regular Rate:Normal ? ? ?  ?Neuro/Psych ? Headaches, Anxiety Depression   ? GI/Hepatic ?Neg liver ROS, hiatal hernia, GERD  ,  ?Endo/Other  ?negative endocrine ROS ? Renal/GU ?negative Renal ROS  ?negative genitourinary ?  ?Musculoskeletal ? ?(+) Arthritis , Osteoarthritis,   ? Abdominal ?Normal abdominal exam  (+)   ?Peds ? Hematology ? ?(+) Blood dyscrasia, anemia ,   ?Anesthesia Other Findings ? ? Reproductive/Obstetrics ? ?  ? ? ? ? ? ? ? ? ? ? ? ? ? ?  ?  ? ? ? ? ? ? ?Anesthesia Physical ?Anesthesia Plan ? ?ASA: 3 ? ?Anesthesia Plan: General  ? ?Post-op Pain Management:   ? ?Induction: Intravenous ? ?PONV Risk Score and Plan: Ondansetron, Dexamethasone, Midazolam and Treatment may vary due to age or medical condition ? ?Airway Management Planned: Mask and Oral ETT ? ?Additional Equipment: Arterial line ? ?Intra-op Plan:  ? ?Post-operative Plan: Extubation in OR ? ?Informed Consent: I have reviewed the patients History and Physical, chart, labs and discussed the procedure including the risks, benefits and alternatives for the proposed anesthesia with the patient or authorized representative who has indicated his/her understanding and acceptance.  ? ? ? ?Dental advisory given ? ?Plan Discussed with: CRNA ? ?Anesthesia Plan Comments: (- Neuromonitoring, remifentanil gtt ? ?Lab Results ?     Component                Value               Date                 ?     WBC                      3.5 (L)             04/21/2021            ?     HGB                      11.2 (L)            04/21/2021           ?     HCT                      36.5                04/21/2021           ?     MCV                      90.6                04/21/2021           ?     PLT                      233  04/21/2021           ?Lab Results ?     Component                Value               Date                 ?     NA                       140                 04/21/2021           ?     K                        3.3 (L)             04/21/2021           ?     CO2                      25                  04/21/2021           ?     GLUCOSE                  89                  04/21/2021           ?     BUN                      9                   04/21/2021           ?     CREATININE               0.71                04/21/2021           ?     CALCIUM                  9.2                 04/21/2021           ?     EGFR                     >60                 01/14/2017           ?     GFRNONAA                 >60                 04/21/2021           ?ECHO 2018: ?Study Conclusions  ?- Left ventricle: The cavity size was normal. Wall thickness was  ???increased in a pattern of moderate LVH. Systolic function was  ???normal. The estimated ejection fraction was in the range of 60%  ???to 65%. Wall motion was normal; there were no regional wall  ???motion abnormalities. Doppler parameters are  consistent with  ???abnormal left ventricular relaxation (grade 1 diastolic  ???dysfunction).  ?- Mitral valve: There was mild regurgitation.  ?- Left atrium: The atrium was moderately dilated. )  ? ? ? ? ? ?Anesthesia Quick Evaluation ? ?

## 2021-04-28 ENCOUNTER — Other Ambulatory Visit: Payer: Self-pay

## 2021-04-28 ENCOUNTER — Inpatient Hospital Stay (HOSPITAL_COMMUNITY): Payer: Medicare Other | Admitting: Anesthesiology

## 2021-04-28 ENCOUNTER — Inpatient Hospital Stay (HOSPITAL_COMMUNITY): Payer: Medicare Other

## 2021-04-28 ENCOUNTER — Encounter (HOSPITAL_COMMUNITY)
Admission: RE | Disposition: A | Discharge status: Home / Self Care | Payer: Self-pay | Source: Ambulatory Visit | Attending: Specialist

## 2021-04-28 ENCOUNTER — Inpatient Hospital Stay (HOSPITAL_COMMUNITY)
Admission: RE | Admit: 2021-04-28 | Discharge: 2021-05-01 | DRG: 453 | Disposition: A | Discharge status: Home / Self Care | Payer: Medicare Other | Source: Ambulatory Visit | Attending: Specialist | Admitting: Specialist

## 2021-04-28 ENCOUNTER — Encounter (HOSPITAL_COMMUNITY): Payer: Self-pay | Admitting: Specialist

## 2021-04-28 DIAGNOSIS — M4185 Other forms of scoliosis, thoracolumbar region: Secondary | ICD-10-CM | POA: Diagnosis not present

## 2021-04-28 DIAGNOSIS — C01 Malignant neoplasm of base of tongue: Secondary | ICD-10-CM | POA: Diagnosis not present

## 2021-04-28 DIAGNOSIS — Y828 Other medical devices associated with adverse incidents: Secondary | ICD-10-CM | POA: Diagnosis present

## 2021-04-28 DIAGNOSIS — D500 Iron deficiency anemia secondary to blood loss (chronic): Secondary | ICD-10-CM | POA: Diagnosis not present

## 2021-04-28 DIAGNOSIS — Z833 Family history of diabetes mellitus: Secondary | ICD-10-CM | POA: Diagnosis not present

## 2021-04-28 DIAGNOSIS — R1310 Dysphagia, unspecified: Secondary | ICD-10-CM | POA: Diagnosis not present

## 2021-04-28 DIAGNOSIS — M4326 Fusion of spine, lumbar region: Secondary | ICD-10-CM | POA: Diagnosis present

## 2021-04-28 DIAGNOSIS — T84296A Other mechanical complication of internal fixation device of vertebrae, initial encounter: Secondary | ICD-10-CM

## 2021-04-28 DIAGNOSIS — M4325 Fusion of spine, thoracolumbar region: Secondary | ICD-10-CM | POA: Diagnosis not present

## 2021-04-28 DIAGNOSIS — M960 Pseudarthrosis after fusion or arthrodesis: Secondary | ICD-10-CM

## 2021-04-28 DIAGNOSIS — I10 Essential (primary) hypertension: Secondary | ICD-10-CM

## 2021-04-28 DIAGNOSIS — E43 Unspecified severe protein-calorie malnutrition: Secondary | ICD-10-CM | POA: Insufficient documentation

## 2021-04-28 DIAGNOSIS — F32A Depression, unspecified: Secondary | ICD-10-CM | POA: Diagnosis present

## 2021-04-28 DIAGNOSIS — T84038A Mechanical loosening of other internal prosthetic joint, initial encounter: Secondary | ICD-10-CM | POA: Diagnosis not present

## 2021-04-28 DIAGNOSIS — M48061 Spinal stenosis, lumbar region without neurogenic claudication: Secondary | ICD-10-CM | POA: Diagnosis not present

## 2021-04-28 DIAGNOSIS — Z85810 Personal history of malignant neoplasm of tongue: Secondary | ICD-10-CM

## 2021-04-28 DIAGNOSIS — F419 Anxiety disorder, unspecified: Secondary | ICD-10-CM | POA: Diagnosis present

## 2021-04-28 DIAGNOSIS — Z931 Gastrostomy status: Secondary | ICD-10-CM | POA: Diagnosis not present

## 2021-04-28 DIAGNOSIS — Z9109 Other allergy status, other than to drugs and biological substances: Secondary | ICD-10-CM | POA: Diagnosis not present

## 2021-04-28 DIAGNOSIS — Z8249 Family history of ischemic heart disease and other diseases of the circulatory system: Secondary | ICD-10-CM

## 2021-04-28 DIAGNOSIS — Z8261 Family history of arthritis: Secondary | ICD-10-CM | POA: Diagnosis not present

## 2021-04-28 DIAGNOSIS — K219 Gastro-esophageal reflux disease without esophagitis: Secondary | ICD-10-CM | POA: Diagnosis present

## 2021-04-28 DIAGNOSIS — Z6823 Body mass index (BMI) 23.0-23.9, adult: Secondary | ICD-10-CM

## 2021-04-28 DIAGNOSIS — G4733 Obstructive sleep apnea (adult) (pediatric): Secondary | ICD-10-CM | POA: Diagnosis not present

## 2021-04-28 DIAGNOSIS — C109 Malignant neoplasm of oropharynx, unspecified: Secondary | ICD-10-CM | POA: Diagnosis not present

## 2021-04-28 DIAGNOSIS — R031 Nonspecific low blood-pressure reading: Secondary | ICD-10-CM | POA: Diagnosis not present

## 2021-04-28 DIAGNOSIS — Z01818 Encounter for other preprocedural examination: Principal | ICD-10-CM

## 2021-04-28 DIAGNOSIS — M1990 Unspecified osteoarthritis, unspecified site: Secondary | ICD-10-CM | POA: Diagnosis not present

## 2021-04-28 DIAGNOSIS — S32009K Unspecified fracture of unspecified lumbar vertebra, subsequent encounter for fracture with nonunion: Secondary | ICD-10-CM | POA: Diagnosis present

## 2021-04-28 DIAGNOSIS — R1319 Other dysphagia: Secondary | ICD-10-CM | POA: Diagnosis not present

## 2021-04-28 DIAGNOSIS — C801 Malignant (primary) neoplasm, unspecified: Secondary | ICD-10-CM | POA: Diagnosis not present

## 2021-04-28 DIAGNOSIS — Z9851 Tubal ligation status: Secondary | ICD-10-CM

## 2021-04-28 DIAGNOSIS — Y838 Other surgical procedures as the cause of abnormal reaction of the patient, or of later complication, without mention of misadventure at the time of the procedure: Secondary | ICD-10-CM | POA: Diagnosis present

## 2021-04-28 DIAGNOSIS — Z923 Personal history of irradiation: Secondary | ICD-10-CM | POA: Diagnosis not present

## 2021-04-28 DIAGNOSIS — T8484XA Pain due to internal orthopedic prosthetic devices, implants and grafts, initial encounter: Secondary | ICD-10-CM | POA: Diagnosis not present

## 2021-04-28 DIAGNOSIS — Z882 Allergy status to sulfonamides status: Secondary | ICD-10-CM | POA: Diagnosis not present

## 2021-04-28 DIAGNOSIS — Z981 Arthrodesis status: Secondary | ICD-10-CM | POA: Diagnosis not present

## 2021-04-28 DIAGNOSIS — M4134 Thoracogenic scoliosis, thoracic region: Secondary | ICD-10-CM | POA: Diagnosis not present

## 2021-04-28 SURGERY — POSTERIOR LUMBAR FUSION 2 WITH HARDWARE REMOVAL
Anesthesia: General | Site: Spine Lumbar

## 2021-04-28 MED ORDER — OXYCODONE HCL ER 10 MG PO T12A: 10.0000 mg | EXTENDED_RELEASE_TABLET | Freq: Two times a day (BID) | ORAL | Status: DC

## 2021-04-28 MED ORDER — POLYETHYLENE GLYCOL 3350 17 G PO PACK: 17.0000 g | PACK | Freq: Every day | ORAL | Status: DC | PRN

## 2021-04-28 MED ORDER — ALBUMIN HUMAN 5 % IV SOLN: INTRAVENOUS | Status: DC | PRN

## 2021-04-28 MED ORDER — POTASSIUM CHLORIDE 20 MEQ PO PACK
20.0000 meq | PACK | Freq: Two times a day (BID) | ORAL | Status: DC
  Administered 2021-04-29 – 2021-05-01 (×6): 20 meq
  Filled 2021-04-28 (×6): qty 1

## 2021-04-28 MED ORDER — MIDAZOLAM HCL 5 MG/5ML IJ SOLN
INTRAMUSCULAR | Status: DC | PRN
  Administered 2021-04-28: 2 mg via INTRAVENOUS

## 2021-04-28 MED ORDER — DOCUSATE SODIUM 100 MG PO CAPS: 100.0000 mg | ORAL_CAPSULE | Freq: Two times a day (BID) | ORAL | Status: DC

## 2021-04-28 MED ORDER — LACTATED RINGERS IV SOLN: INTRAVENOUS | Status: DC

## 2021-04-28 MED ORDER — LIDOCAINE 4 % EX CREA
1.0000 "application " | TOPICAL_CREAM | CUTANEOUS | Status: DC | PRN
  Filled 2021-04-28: qty 5

## 2021-04-28 MED ORDER — FENTANYL CITRATE (PF) 250 MCG/5ML IJ SOLN
INTRAMUSCULAR | Status: AC
Start: 2021-04-28 — End: ?
  Filled 2021-04-28: qty 5

## 2021-04-28 MED ORDER — ONDANSETRON HCL 4 MG/2ML IJ SOLN
INTRAMUSCULAR | Status: DC | PRN
  Administered 2021-04-28: 4 mg via INTRAVENOUS

## 2021-04-28 MED ORDER — KETAMINE HCL 50 MG/5ML IJ SOSY
PREFILLED_SYRINGE | INTRAMUSCULAR | Status: AC
  Filled 2021-04-28: qty 5

## 2021-04-28 MED ORDER — METHOCARBAMOL 500 MG PO TABS
500.0000 mg | ORAL_TABLET | Freq: Four times a day (QID) | ORAL | Status: DC | PRN
  Administered 2021-04-30: 500 mg
  Filled 2021-04-28: qty 1

## 2021-04-28 MED ORDER — SUCCINYLCHOLINE CHLORIDE 200 MG/10ML IV SOSY
PREFILLED_SYRINGE | INTRAVENOUS | Status: DC | PRN
  Administered 2021-04-28: 80 mg via INTRAVENOUS

## 2021-04-28 MED ORDER — SODIUM CHLORIDE 0.9 % IV SOLN
0.0125 ug/kg/min | INTRAVENOUS | Status: AC
  Administered 2021-04-28: .05 ug/kg/min via INTRAVENOUS
  Administered 2021-04-28: .15 ug/kg/min via INTRAVENOUS
  Filled 2021-04-28: qty 2000

## 2021-04-28 MED ORDER — SERTRALINE HCL 50 MG PO TABS: 25.0000 mg | ORAL_TABLET | Freq: Every day | ORAL | Status: DC

## 2021-04-28 MED ORDER — BUPIVACAINE LIPOSOME 1.3 % IJ SUSP
INTRAMUSCULAR | Status: AC
  Filled 2021-04-28: qty 20

## 2021-04-28 MED ORDER — POTASSIUM CHLORIDE CRYS ER 20 MEQ PO TBCR: 20.0000 meq | EXTENDED_RELEASE_TABLET | Freq: Two times a day (BID) | ORAL | Status: DC

## 2021-04-28 MED ORDER — LACTATED RINGERS IV SOLN: INTRAVENOUS | Status: DC | PRN

## 2021-04-28 MED ORDER — PREGABALIN 75 MG PO CAPS
75.0000 mg | ORAL_CAPSULE | Freq: Two times a day (BID) | ORAL | Status: DC
  Administered 2021-04-29 – 2021-05-01 (×6): 75 mg
  Filled 2021-04-28 (×6): qty 1

## 2021-04-28 MED ORDER — PHENYLEPHRINE HCL-NACL 20-0.9 MG/250ML-% IV SOLN
INTRAVENOUS | Status: DC | PRN
  Administered 2021-04-28: 30 ug/min via INTRAVENOUS

## 2021-04-28 MED ORDER — ACETAMINOPHEN 10 MG/ML IV SOLN
INTRAVENOUS | Status: AC
  Filled 2021-04-28: qty 100

## 2021-04-28 MED ORDER — SODIUM CHLORIDE 0.9% FLUSH: 3.0000 mL | INTRAVENOUS | Status: DC | PRN

## 2021-04-28 MED ORDER — HEMOSTATIC AGENTS (NO CHARGE) OPTIME
TOPICAL | Status: DC | PRN
  Administered 2021-04-28: 1 via TOPICAL

## 2021-04-28 MED ORDER — LIDOCAINE 2% (20 MG/ML) 5 ML SYRINGE
INTRAMUSCULAR | Status: AC
  Filled 2021-04-28: qty 5

## 2021-04-28 MED ORDER — PROPOFOL 10 MG/ML IV BOLUS
INTRAVENOUS | Status: DC | PRN
  Administered 2021-04-28: 130 mg via INTRAVENOUS
  Administered 2021-04-28 (×3): 30 mg via INTRAVENOUS

## 2021-04-28 MED ORDER — PROPOFOL 10 MG/ML IV BOLUS
INTRAVENOUS | Status: AC
  Filled 2021-04-28: qty 20

## 2021-04-28 MED ORDER — PROPOFOL 1000 MG/100ML IV EMUL
INTRAVENOUS | Status: AC
  Filled 2021-04-28: qty 100

## 2021-04-28 MED ORDER — ONDANSETRON HCL 4 MG/2ML IJ SOLN: 4.0000 mg | Freq: Four times a day (QID) | INTRAMUSCULAR | Status: DC | PRN

## 2021-04-28 MED ORDER — OXYCODONE HCL 5 MG PO TABS: 10.0000 mg | ORAL_TABLET | ORAL | Status: DC | PRN

## 2021-04-28 MED ORDER — 0.9 % SODIUM CHLORIDE (POUR BTL) OPTIME
TOPICAL | Status: DC | PRN
  Administered 2021-04-28: 1000 mL

## 2021-04-28 MED ORDER — MORPHINE SULFATE (PF) 2 MG/ML IV SOLN: 1.0000 mg | INTRAVENOUS | Status: DC | PRN

## 2021-04-28 MED ORDER — CEFAZOLIN SODIUM-DEXTROSE 2-4 GM/100ML-% IV SOLN
2.0000 g | INTRAVENOUS | Status: AC
  Administered 2021-04-28 (×2): 2 g via INTRAVENOUS
  Filled 2021-04-28: qty 100

## 2021-04-28 MED ORDER — SODIUM CHLORIDE 0.9 % IV SOLN: INTRAVENOUS | Status: DC

## 2021-04-28 MED ORDER — SUCCINYLCHOLINE CHLORIDE 200 MG/10ML IV SOSY
PREFILLED_SYRINGE | INTRAVENOUS | Status: AC
  Filled 2021-04-28: qty 10

## 2021-04-28 MED ORDER — ALUM & MAG HYDROXIDE-SIMETH 200-200-20 MG/5ML PO SUSP: 30.0000 mL | Freq: Four times a day (QID) | ORAL | Status: DC | PRN

## 2021-04-28 MED ORDER — DEXTROSE 5 % IV SOLN
500.0000 mg | Freq: Four times a day (QID) | INTRAVENOUS | Status: DC | PRN
  Filled 2021-04-28: qty 5

## 2021-04-28 MED ORDER — ACETAMINOPHEN 10 MG/ML IV SOLN: 1000.0000 mg | Freq: Once | INTRAVENOUS | Status: DC | PRN

## 2021-04-28 MED ORDER — CEFAZOLIN SODIUM 1 G IJ SOLR
INTRAMUSCULAR | Status: AC
  Filled 2021-04-28: qty 20

## 2021-04-28 MED ORDER — PANTOPRAZOLE SODIUM 40 MG PO TBEC
40.0000 mg | DELAYED_RELEASE_TABLET | Freq: Every day | ORAL | Status: DC
Start: 2021-04-28 — End: 2021-04-28

## 2021-04-28 MED ORDER — BACLOFEN 10 MG PO TABS
10.0000 mg | ORAL_TABLET | Freq: Three times a day (TID) | ORAL | Status: DC
  Administered 2021-04-29 – 2021-05-01 (×8): 10 mg
  Filled 2021-04-28 (×8): qty 1

## 2021-04-28 MED ORDER — FENTANYL CITRATE (PF) 100 MCG/2ML IJ SOLN
25.0000 ug | INTRAMUSCULAR | Status: DC | PRN
  Administered 2021-04-28 (×4): 25 ug via INTRAVENOUS

## 2021-04-28 MED ORDER — METHOCARBAMOL 1000 MG/10ML IJ SOLN
500.0000 mg | Freq: Four times a day (QID) | INTRAVENOUS | Status: DC | PRN
  Filled 2021-04-28: qty 5

## 2021-04-28 MED ORDER — SERTRALINE HCL 50 MG PO TABS
25.0000 mg | ORAL_TABLET | Freq: Every day | ORAL | Status: DC
  Administered 2021-04-29 – 2021-05-01 (×3): 25 mg
  Filled 2021-04-28 (×3): qty 1

## 2021-04-28 MED ORDER — THROMBIN 20000 UNITS EX SOLR
CUTANEOUS | Status: AC
  Filled 2021-04-28: qty 20000

## 2021-04-28 MED ORDER — HYDROMORPHONE HCL 1 MG/ML IJ SOLN
INTRAMUSCULAR | Status: AC
  Filled 2021-04-28: qty 0.5

## 2021-04-28 MED ORDER — AMLODIPINE BESYLATE 10 MG PO TABS
10.0000 mg | ORAL_TABLET | Freq: Every day | ORAL | Status: DC
  Administered 2021-04-29: 10 mg
  Filled 2021-04-28: qty 1

## 2021-04-28 MED ORDER — ACETAMINOPHEN 10 MG/ML IV SOLN
INTRAVENOUS | Status: DC | PRN
  Administered 2021-04-28: 1000 mg via INTRAVENOUS

## 2021-04-28 MED ORDER — ACETAMINOPHEN 325 MG PO TABS: 650.0000 mg | ORAL_TABLET | ORAL | Status: DC | PRN

## 2021-04-28 MED ORDER — CALCIUM CITRATE 950 (200 CA) MG PO TABS
900.0000 mg | ORAL_TABLET | Freq: Every day | ORAL | Status: DC
  Administered 2021-04-29 – 2021-04-30 (×2): 950 mg
  Filled 2021-04-28 (×4): qty 1

## 2021-04-28 MED ORDER — VANCOMYCIN HCL 1000 MG IV SOLR
INTRAVENOUS | Status: DC | PRN
Start: 2021-04-28 — End: 2021-04-28
  Administered 2021-04-28: 1000 mg via TOPICAL

## 2021-04-28 MED ORDER — ACETAMINOPHEN 160 MG/5ML PO SOLN
650.0000 mg | ORAL | Status: DC | PRN
  Administered 2021-04-30: 650 mg
  Filled 2021-04-28: qty 20.3

## 2021-04-28 MED ORDER — METHOCARBAMOL 500 MG PO TABS: 500.0000 mg | ORAL_TABLET | Freq: Four times a day (QID) | ORAL | Status: DC | PRN

## 2021-04-28 MED ORDER — DEXAMETHASONE SODIUM PHOSPHATE 10 MG/ML IJ SOLN
INTRAMUSCULAR | Status: AC
  Filled 2021-04-28: qty 1

## 2021-04-28 MED ORDER — BUPIVACAINE LIPOSOME 1.3 % IJ SUSP: 10.0000 mL | Freq: Once | INTRAMUSCULAR | Status: DC

## 2021-04-28 MED ORDER — CEFAZOLIN SODIUM-DEXTROSE 2-4 GM/100ML-% IV SOLN
2.0000 g | Freq: Three times a day (TID) | INTRAVENOUS | Status: AC
  Administered 2021-04-28 – 2021-04-29 (×2): 2 g via INTRAVENOUS
  Filled 2021-04-28 (×2): qty 100

## 2021-04-28 MED ORDER — OXYCODONE HCL 5 MG/5ML PO SOLN
5.0000 mg | ORAL | Status: DC | PRN
  Administered 2021-05-01: 5 mg
  Filled 2021-04-28: qty 5

## 2021-04-28 MED ORDER — PHENYLEPHRINE 40 MCG/ML (10ML) SYRINGE FOR IV PUSH (FOR BLOOD PRESSURE SUPPORT)
PREFILLED_SYRINGE | INTRAVENOUS | Status: AC
  Filled 2021-04-28: qty 10

## 2021-04-28 MED ORDER — HYDROMORPHONE HCL 1 MG/ML IJ SOLN
INTRAMUSCULAR | Status: DC | PRN
Start: 2021-04-28 — End: 2021-04-28
  Administered 2021-04-28: .5 mg via INTRAVENOUS

## 2021-04-28 MED ORDER — FLEET ENEMA 7-19 GM/118ML RE ENEM: 1.0000 | ENEMA | Freq: Once | RECTAL | Status: DC | PRN

## 2021-04-28 MED ORDER — DOCUSATE SODIUM 50 MG/5ML PO LIQD
100.0000 mg | Freq: Two times a day (BID) | ORAL | Status: DC
  Administered 2021-04-29 – 2021-04-30 (×2): 100 mg
  Filled 2021-04-28 (×6): qty 10

## 2021-04-28 MED ORDER — ACETAMINOPHEN 650 MG RE SUPP: 650.0000 mg | RECTAL | Status: DC | PRN

## 2021-04-28 MED ORDER — MENTHOL 3 MG MT LOZG
1.0000 | LOZENGE | OROMUCOSAL | Status: DC | PRN
  Administered 2021-04-29: 3 mg via ORAL
  Filled 2021-04-28: qty 9

## 2021-04-28 MED ORDER — DEXAMETHASONE SODIUM PHOSPHATE 10 MG/ML IJ SOLN
INTRAMUSCULAR | Status: DC | PRN
  Administered 2021-04-28: 10 mg via INTRAVENOUS

## 2021-04-28 MED ORDER — PHENYLEPHRINE 40 MCG/ML (10ML) SYRINGE FOR IV PUSH (FOR BLOOD PRESSURE SUPPORT)
PREFILLED_SYRINGE | INTRAVENOUS | Status: DC | PRN
  Administered 2021-04-28: 80 ug via INTRAVENOUS
  Administered 2021-04-28 (×2): 120 ug via INTRAVENOUS

## 2021-04-28 MED ORDER — MIDAZOLAM HCL 2 MG/2ML IJ SOLN
INTRAMUSCULAR | Status: AC
  Filled 2021-04-28: qty 2

## 2021-04-28 MED ORDER — PHENOL 1.4 % MT LIQD: 1.0000 | OROMUCOSAL | Status: DC | PRN

## 2021-04-28 MED ORDER — FENTANYL CITRATE (PF) 100 MCG/2ML IJ SOLN
INTRAMUSCULAR | Status: AC
  Filled 2021-04-28: qty 2

## 2021-04-28 MED ORDER — SPIRONOLACTONE 25 MG PO TABS
25.0000 mg | ORAL_TABLET | Freq: Two times a day (BID) | ORAL | Status: DC
  Administered 2021-04-29 – 2021-05-01 (×6): 25 mg
  Filled 2021-04-28 (×6): qty 1

## 2021-04-28 MED ORDER — ONDANSETRON HCL 4 MG/2ML IJ SOLN
INTRAMUSCULAR | Status: AC
  Filled 2021-04-28: qty 2

## 2021-04-28 MED ORDER — TRANEXAMIC ACID-NACL 1000-0.7 MG/100ML-% IV SOLN
INTRAVENOUS | Status: AC
  Filled 2021-04-28: qty 100

## 2021-04-28 MED ORDER — CHOLECALCIFEROL 10 MCG (400 UNIT) PO TABS
400.0000 [IU] | ORAL_TABLET | Freq: Every day | ORAL | Status: DC
  Administered 2021-04-29 – 2021-05-01 (×3): 400 [IU]
  Filled 2021-04-28 (×3): qty 1

## 2021-04-28 MED ORDER — FERROUS SULFATE 300 (60 FE) MG/5ML PO SYRP
300.0000 mg | ORAL_SOLUTION | Freq: Every day | ORAL | Status: DC
  Administered 2021-04-29 – 2021-05-01 (×3): 300 mg via ORAL
  Filled 2021-04-28 (×3): qty 5

## 2021-04-28 MED ORDER — OXYCODONE HCL 5 MG PO TABS: 5.0000 mg | ORAL_TABLET | ORAL | Status: DC | PRN

## 2021-04-28 MED ORDER — CHLORHEXIDINE GLUCONATE 0.12 % MT SOLN
15.0000 mL | Freq: Once | OROMUCOSAL | Status: AC
  Administered 2021-04-28: 15 mL via OROMUCOSAL
  Filled 2021-04-28: qty 15

## 2021-04-28 MED ORDER — BUPIVACAINE HCL (PF) 0.5 % IJ SOLN
INTRAMUSCULAR | Status: AC
  Filled 2021-04-28: qty 30

## 2021-04-28 MED ORDER — VANCOMYCIN HCL 1000 MG IV SOLR
INTRAVENOUS | Status: AC
  Filled 2021-04-28: qty 20

## 2021-04-28 MED ORDER — SODIUM CHLORIDE 0.9 % IV SOLN: INTRAVENOUS | Status: DC | PRN

## 2021-04-28 MED ORDER — ONDANSETRON HCL 4 MG PO TABS: 4.0000 mg | ORAL_TABLET | Freq: Four times a day (QID) | ORAL | Status: DC | PRN

## 2021-04-28 MED ORDER — TRANEXAMIC ACID-NACL 1000-0.7 MG/100ML-% IV SOLN
1000.0000 mg | INTRAVENOUS | Status: AC
  Administered 2021-04-28: 1000 mg via INTRAVENOUS
  Filled 2021-04-28: qty 100

## 2021-04-28 MED ORDER — OXYCODONE HCL 5 MG/5ML PO SOLN
10.0000 mg | ORAL | Status: DC | PRN
  Administered 2021-04-29 – 2021-04-30 (×3): 10 mg
  Filled 2021-04-28 (×3): qty 10

## 2021-04-28 MED ORDER — FENTANYL CITRATE (PF) 250 MCG/5ML IJ SOLN
INTRAMUSCULAR | Status: DC | PRN
  Administered 2021-04-28: 100 ug via INTRAVENOUS
  Administered 2021-04-28: 50 ug via INTRAVENOUS
  Administered 2021-04-28: 100 ug via INTRAVENOUS

## 2021-04-28 MED ORDER — BISACODYL 5 MG PO TBEC: 5.0000 mg | DELAYED_RELEASE_TABLET | Freq: Every day | ORAL | Status: DC | PRN

## 2021-04-28 MED ORDER — AMLODIPINE BESYLATE 10 MG PO TABS: 10.0000 mg | ORAL_TABLET | Freq: Every day | ORAL | Status: DC

## 2021-04-28 MED ORDER — LIDOCAINE 2% (20 MG/ML) 5 ML SYRINGE
INTRAMUSCULAR | Status: DC | PRN
  Administered 2021-04-28: 40 mg via INTRAVENOUS

## 2021-04-28 MED ORDER — ASPIRIN 81 MG PO CHEW
81.0000 mg | CHEWABLE_TABLET | Freq: Every day | ORAL | Status: DC
  Administered 2021-04-29 – 2021-05-01 (×3): 81 mg
  Filled 2021-04-28 (×3): qty 1

## 2021-04-28 MED ORDER — FERROUS SULFATE 220 (44 FE) MG/5ML PO ELIX
220.0000 mg | ORAL_SOLUTION | Freq: Every day | ORAL | Status: DC
  Filled 2021-04-28: qty 5

## 2021-04-28 MED ORDER — ORAL CARE MOUTH RINSE: 15.0000 mL | Freq: Once | OROMUCOSAL | Status: AC

## 2021-04-28 MED ORDER — PANTOPRAZOLE SODIUM 40 MG IV SOLR: 40.0000 mg | Freq: Every day | INTRAVENOUS | Status: DC

## 2021-04-28 MED ORDER — PANTOPRAZOLE 2 MG/ML SUSPENSION
40.0000 mg | Freq: Every day | ORAL | Status: DC
  Administered 2021-04-29 – 2021-05-01 (×3): 40 mg
  Filled 2021-04-28 (×4): qty 20

## 2021-04-28 MED ORDER — FOLIC ACID 1 MG PO TABS
500.0000 ug | ORAL_TABLET | Freq: Every day | ORAL | Status: DC
  Administered 2021-04-29: 0.5 mg
  Administered 2021-04-30: 500 ug
  Administered 2021-05-01: 0.5 mg
  Filled 2021-04-28 (×3): qty 1

## 2021-04-28 MED ORDER — CLOTRIMAZOLE 10 MG MT TROC
10.0000 mg | Freq: Three times a day (TID) | OROMUCOSAL | Status: DC | PRN
  Administered 2021-04-29: 10 mg via ORAL
  Filled 2021-04-28 (×3): qty 1

## 2021-04-28 MED ORDER — BUPIVACAINE LIPOSOME 1.3 % IJ SUSP
INTRAMUSCULAR | Status: DC | PRN
  Administered 2021-04-28: 10 mL
  Administered 2021-04-28: 20 mL

## 2021-04-28 MED ORDER — THROMBIN 20000 UNITS EX SOLR: CUTANEOUS | Status: DC | PRN

## 2021-04-28 MED ORDER — PROPOFOL 500 MG/50ML IV EMUL
INTRAVENOUS | Status: DC | PRN
  Administered 2021-04-28 (×2): 50 ug/kg/min via INTRAVENOUS

## 2021-04-28 MED ORDER — SODIUM CHLORIDE 0.9 % IV SOLN
0.0125 ug/kg/min | INTRAVENOUS | Status: DC
  Filled 2021-04-28: qty 2000

## 2021-04-28 MED ORDER — DIPHENOXYLATE-ATROPINE 2.5-0.025 MG/5ML PO LIQD
10.0000 mL | Freq: Four times a day (QID) | ORAL | Status: DC | PRN
  Administered 2021-04-29 – 2021-04-30 (×3): 10 mL
  Filled 2021-04-28 (×3): qty 10

## 2021-04-28 MED ORDER — SODIUM CHLORIDE 0.9% FLUSH
3.0000 mL | Freq: Two times a day (BID) | INTRAVENOUS | Status: DC
  Administered 2021-04-28 – 2021-05-01 (×6): 3 mL via INTRAVENOUS

## 2021-04-28 MED ORDER — DIPHENOXYLATE-ATROPINE 2.5-0.025 MG PO TABS: 2.0000 | ORAL_TABLET | Freq: Four times a day (QID) | ORAL | Status: DC | PRN

## 2021-04-28 SURGICAL SUPPLY — 84 items
ADH SKN CLS APL DERMABOND .7 (GAUZE/BANDAGES/DRESSINGS) ×2
BAG COUNTER SPONGE SURGICOUNT (BAG) ×3 IMPLANT
BAG SPNG CNTER NS LX DISP (BAG) ×1
BAND INSRT 18 STRL LF DISP RB (MISCELLANEOUS) ×2
BAND RUBBER #18 3X1/16 STRL (MISCELLANEOUS) ×2 IMPLANT
BLADE CLIPPER SURG (BLADE) IMPLANT
BONE VIVIGEN FORMABLE 10CC (Bone Implant) ×2 IMPLANT
BUR MATCHSTICK NEURO 3.0 LAGG (BURR) ×3 IMPLANT
BUR RND FLUTED 2.5 (BURR) IMPLANT
CAGE BULLET CONCORDE 9X8X23 (Cage) ×1 IMPLANT
CONNECTOR EXPEDIUM TI 55MM (Connector) ×2 IMPLANT
COVER BACK TABLE 80X110 HD (DRAPES) ×3 IMPLANT
COVER MAYO STAND STRL (DRAPES) ×6 IMPLANT
DECANTER SPIKE VIAL GLASS SM (MISCELLANEOUS) ×3 IMPLANT
DERMABOND ADVANCED (GAUZE/BANDAGES/DRESSINGS) ×2
DERMABOND ADVANCED .7 DNX12 (GAUZE/BANDAGES/DRESSINGS) ×2 IMPLANT
DRAPE C-ARM 42X72 X-RAY (DRAPES) ×6 IMPLANT
DRAPE MICROSCOPE LEICA (MISCELLANEOUS) ×1 IMPLANT
DRSG MEPILEX BORDER 4X12 (GAUZE/BANDAGES/DRESSINGS) ×1 IMPLANT
DURAPREP 26ML APPLICATOR (WOUND CARE) ×3 IMPLANT
ELECT BLADE 6.5 EXT (BLADE) ×1 IMPLANT
ELECT CAUTERY BLADE 6.4 (BLADE) ×3 IMPLANT
ELECT REM PT RETURN 9FT ADLT (ELECTROSURGICAL) ×2
ELECTRODE REM PT RTRN 9FT ADLT (ELECTROSURGICAL) ×2 IMPLANT
EVACUATOR 1/8 PVC DRAIN (DRAIN) ×1 IMPLANT
FEE INTRAOP CADWELL SUPPLY NCS (MISCELLANEOUS) IMPLANT
FEE INTRAOP MONITOR IMPULS NCS (MISCELLANEOUS) IMPLANT
GAUZE SPONGE 4X4 12PLY STRL (GAUZE/BANDAGES/DRESSINGS) ×1 IMPLANT
GLOVE BIOGEL PI IND STRL 7.0 (GLOVE) IMPLANT
GLOVE BIOGEL PI INDICATOR 7.0 (GLOVE) ×1
GLOVE INDICATOR 8.0 STRL GRN (GLOVE) ×1 IMPLANT
GLOVE SRG 8 PF TXTR STRL LF DI (GLOVE) ×2 IMPLANT
GLOVE SS BIOGEL STRL SZ 7.5 (GLOVE) IMPLANT
GLOVE SUPERSENSE BIOGEL SZ 7.5 (GLOVE) ×1
GLOVE SURG 8.5 LATEX PF (GLOVE) ×4 IMPLANT
GLOVE SURG LTX SZ9 (GLOVE) ×3 IMPLANT
GLOVE SURG ORTHO LTX SZ7.5 (GLOVE) ×3 IMPLANT
GLOVE SURG SS PI 6.5 STRL IVOR (GLOVE) ×1 IMPLANT
GLOVE SURG UNDER POLY LF SZ8 (GLOVE) ×2
GOWN STRL REUS W/ TWL LRG LVL3 (GOWN DISPOSABLE) ×2 IMPLANT
GOWN STRL REUS W/TWL 2XL LVL3 (GOWN DISPOSABLE) ×7 IMPLANT
GOWN STRL REUS W/TWL LRG LVL3 (GOWN DISPOSABLE) ×6
GRAFT BNE MATRIX VG FRMBL L 10 (Bone Implant) IMPLANT
INTRAOP CADWELL SUPPLY FEE NCS (MISCELLANEOUS) ×1
INTRAOP DISP SUPPLY FEE NCS (MISCELLANEOUS) ×2
INTRAOP MONITOR FEE IMPULS NCS (MISCELLANEOUS) ×1
INTRAOP MONITOR FEE IMPULSE (MISCELLANEOUS) ×2
KIT BASIN OR (CUSTOM PROCEDURE TRAY) ×3 IMPLANT
KIT INFUSE X SMALL 1.4CC (Orthopedic Implant) ×1 IMPLANT
KIT POSITION SURG JACKSON T1 (MISCELLANEOUS) ×3 IMPLANT
KIT TURNOVER KIT B (KITS) ×3 IMPLANT
NDL SPNL 18GX3.5 QUINCKE PK (NEEDLE) ×2 IMPLANT
NEEDLE 22X1 1/2 (OR ONLY) (NEEDLE) ×3 IMPLANT
NEEDLE SPNL 18GX3.5 QUINCKE PK (NEEDLE) ×2 IMPLANT
NS IRRIG 1000ML POUR BTL (IV SOLUTION) ×3 IMPLANT
PACK LAMINECTOMY ORTHO (CUSTOM PROCEDURE TRAY) ×3 IMPLANT
PATTIES SURGICAL .75X.75 (GAUZE/BANDAGES/DRESSINGS) ×3 IMPLANT
ROD VIPER 2 STRAIGHT 480 (Rod) ×2 IMPLANT
SCREW CORT FIX FEN 5.5X5X45MM (Screw) ×1 IMPLANT
SCREW CORT FIX FEN 5.5X6X30MM (Screw) ×1 IMPLANT
SCREW SET SINGLE INNER (Screw) ×9 IMPLANT
SCREW VIPER 7X50MM (Screw) ×5 IMPLANT
SEALANT ADHERUS EXTEND TIP (MISCELLANEOUS) ×1 IMPLANT
SPONGE SURGIFOAM ABS GEL 100 (HEMOSTASIS) ×1 IMPLANT
SPONGE T-LAP 4X18 ~~LOC~~+RFID (SPONGE) ×3 IMPLANT
SUT VIC AB 0 CT1 27 (SUTURE) ×4
SUT VIC AB 0 CT1 27XBRD ANBCTR (SUTURE) ×2 IMPLANT
SUT VIC AB 1 CTX 36 (SUTURE) ×4
SUT VIC AB 1 CTX36XBRD ANBCTR (SUTURE) ×4 IMPLANT
SUT VIC AB 2-0 CT1 27 (SUTURE) ×4
SUT VIC AB 2-0 CT1 TAPERPNT 27 (SUTURE) ×2 IMPLANT
SUT VIC AB 3-0 X1 27 (SUTURE) ×3 IMPLANT
SYR 20ML LL LF (SYRINGE) ×3 IMPLANT
SYR CONTROL 10ML LL (SYRINGE) ×6 IMPLANT
TAP CANN VIPER2 DL 5.0 (TAP) ×1 IMPLANT
TAP CANN VIPER2 DL 6.0 (TAP) ×1 IMPLANT
TAP CANN VIPER2 DL 7.0 (TAP) ×1 IMPLANT
TAP VIPER MIS 4.35MM (TAP) ×1 IMPLANT
TAPE CLOTH SURG 4X10 WHT LF (GAUZE/BANDAGES/DRESSINGS) ×1 IMPLANT
TOWEL GREEN STERILE (TOWEL DISPOSABLE) ×3 IMPLANT
TOWEL GREEN STERILE FF (TOWEL DISPOSABLE) ×3 IMPLANT
TRAY FOLEY MTR SLVR 16FR STAT (SET/KITS/TRAYS/PACK) ×3 IMPLANT
WATER STERILE IRR 1000ML POUR (IV SOLUTION) ×3 IMPLANT
YANKAUER SUCT BULB TIP NO VENT (SUCTIONS) ×3 IMPLANT

## 2021-04-28 NOTE — Transfer of Care (Signed)
Immediate Anesthesia Transfer of Care Note ? ?Patient: Kristen [name] ? ?Procedure(s) Performed: REMOVAL OF RODS AND SCREWS LUMBAR THREE TO SACRAL ONE, LUMBAR THREE-FOUR LUMBAR INTERBODY FUSION,  INSTRUMENTATION EXTENDED TO THOACIC TWELVE LEVEL, PEDICLE SCREWS, RODS, CAGE, LOCAL BONE GRAFT, ALLOGRAFT, BMP (Spine Lumbar) ? ?Patient Location: PACU ? ?Anesthesia Type:General ? ?Level of Consciousness: oriented, drowsy and patient cooperative ? ?Airway & Oxygen Therapy: Patient Spontanous Breathing and Patient connected to face mask oxygen ? ?Post-op Assessment: Report given to RN and Post -op Vital signs reviewed and stable ? ?Post vital signs: Reviewed ? ?Last Vitals:  ?Vitals Value Taken Time  ?BP 132/90 04/28/21 1502  ?Temp    ?Pulse 76 04/28/21 1503  ?Resp 10 04/28/21 1503  ?SpO2 100 % 04/28/21 1503  ?Vitals shown include unvalidated device data. ? ?Last Pain:  ?Vitals:  ? 04/28/21 0626  ?TempSrc:   ?PainSc: 0-No pain  ?   ? ?  ? ?Complications: No notable events documented. ?

## 2021-04-28 NOTE — Discharge Instructions (Signed)

## 2021-04-28 NOTE — Op Note (Signed)
04/28/2021 ? ?2:22 PM ? ?PATIENT:  Kristen [name]  56 y.o. female ? ?MRN: [id number] ? ?OPERATIVE REPORT ? ?PRE-OPERATIVE DIAGNOSIS:  pseudarthrosis lumbar fusion Lumbar three-four, painful orthopedic hardware with loosening of Lumbar three screws, thoracolumbar fusion Toracic five-Sacral one ? ?POST-OPERATIVE DIAGNOSIS:  pseudarthrosis lumbar fusion Lumbar three-four, painful orthopedic hardware with loosening of Lumbar three screws, thoracolumbar fusion Toracic five-Sacral one ? ?PROCEDURE:  Procedure(s): ?REMOVAL OF RODS AND SCREWS LUMBAR THREE TO SACRAL ONE, RIGHT LUMBAR THREE-FOUR LUMBAR INTERBODY FUSION,  INSTRUMENTATION EXTENDED TO THOACIC TWELVE LEVEL, PEDICLE SCREWS, RODS, CAGE, LOCAL BONE GRAFT, ALLOGRAFT, BMP ?   ?SURGEON:  Jessy Oto, MD  ?   ?ASSISTANT:  Benjiman Core, PA-C  (Present throughout the entire procedure and necessary for completion of procedure in a timely manner)  ?   ?ANESTHESIA:  General, ?   ?COMPLICATIONS:  None.  ?   ?COMPONENTS:  ?Implant Name Type Inv. Item Serial No. Manufacturer Lot No. LRB No. Used Action  ?BONE VIVIGEN FORMABLE 10CC - [id number] Bone Implant BONE [employer] FORMABLE 10CC [id number] [employer]  N/A 1 Implanted  ?SCREW VIPER 7X50MM - [id number] Screw SCREW VIPER 7X50MM  [employer]  N/A 3 Implanted  ?SCREW VIPER 7X50MM - [id number] Screw SCREW VIPER 7X50MM  [employer] SPINE  N/A 2 Implanted  ?SCREW SET SINGLE INNER - [id number] Screw SCREW SET SINGLE INNER  [employer] SPINE  N/A 9 Implanted  ?KIT INFUSE X SMALL 1.4CC - [id number] Orthopedic Implant KIT INFUSE X SMALL 1.4CC  [employer] [facility] [id number] N/A 1 Implanted  ?SCREW CORT FIX FEN 5.5X5X45MM - [id number] Screw SCREW CORT FIX FEN 5.5X5X45MM  [employer] SPINE  N/A 1 Implanted  ?CONNECTOR EXPEDIUM TI 55MM - [id number] Connector CONNECTOR EXPEDIUM TI 55MM  [employer] SPINE  N/A 2 Implanted  ?CAGE BULLET CONCORDE [id number] - [id number] Cage CAGE BULLET  CONCORDE 9X8X23  [employer] SPINE  N/A 1 Implanted  ?6 x 25 Viper Screw Screw   DEPUY SYNTHES  N/A 1 Implanted  ?ROD VIPER 2 STRAIGHT 480 - [id number] Rod ROD VIPER 2 STRAIGHT 480  [employer] SPINE  N/A 2 Implanted  ?1 screw, 1 rod, 1 partial rod, 1 crosslink, 3 caps      N/A 1 Explanted  ? ?  ?INTRA NEUROMONITORING STUDIES: ?L1   Left 20+  Right  4 ?L2   Left 20+   Right  20+ ?L3  Left 20+  Right  20+ ? No intraoperative neuromonitoring changes noted during the entire case including both sensory evoked potentials and motor evoked potentials.  ? ?  ?PROCEDURE: ?   ?The patient was met in the holding area, and the appropriate T12to Right L3-S1 lumbar levels identified and marked with an "X" and my initials. I had discussion with the patient in the preop holding area regarding consent form. Patient understands the rationale for the fusion site as the L3-4 segments for stenosis and pseudarthrosis with previous fusion L3 to S1 with extension of the fusion to the T12 level in order to bridge the lumbar fusion junction.  ?The patient was then transported to OR and was placed under general anestheticwithout difficulty. The patient received appropriate preoperative antibiotic prophylaxis ancef 2 gm.  Nursing staff inserted a Foley catheter under sterile conditions. The patient was then turned to a prone position using the Diablock spine frame. PAS. all pressure points well padded the arms at the side to 90? 90. Standard prep with  DuraPrep solution draped in the usual manner from the mid dorsal spine to the sacral segment. Iodine Vi-Drape was used and the incision was marked. Time-out procedure was called and correct. Loupe magnification and headlight were used during this portion procedure.  Skin in the midline between D12 and L5 was then infiltrated with local marcaine 0.5% 1:1 exparel 1.3% total of 30 cc used. Incision was then made through the skin and subcutaneous layers down to the patient's lumbodorsal  fascia and spinous processes. The incision then carried sharply along the supraspinous ligament and then continuing the lateral aspects of the spinous processes T12,L1 and L2 and carried lateral over the lateral facets at L2-3 and L4 and L5-S1. Cobb elevators used to carefully elevate the paralumbar muscles off of the posterior elements using electrocautery carefully drilled bleeding and perform dissection of the muscle tissues of the exposing the posterior thoracolumbar scoliosis fusion T12 to the L5-S1 levels. The retained right Harrington Rod present from T12 to L3. This was debrided and soft tissue and bone removed and it was freed up and then divided at about the T12 level.  Continuing the exposure out laterally to expose the retained pedicle screws and rods at L4, L5 and  S1 bilaterally. Bleeding controlled using electrocautery monopolar electrocautery.  Cerebellar retractors were used for the lower part of the incision. Cerebellar retractors in the cephalad portion of the incision.  ?  ?C-arm fluoroscopy was then brought into the field and using C-arm fluoroscopy then a hole made into the lateral aspect of the left pedicle of L1 observed in the pedicle using ball-tipped nerve hook and hockey stick nerve probe initial entry was determined on fluoroscopy to be good position alignment so that a ball handled probe was then used to probe the left L1 pedicle to a depth of nearly 50 mm observed on C-arm fluoroscopy to be beyond the midpoint of the lumbar vertebra and then position alignment within the left L1 pedicle this was then removed and the pedicle channel probed demonstrating patency no sign of rupture the cortex of the pedicle. Tapping with a 4.35 mm screw tap then 5.0 mm x 50 mm screw was placed on the left side at the L1 level. C-arm fluoroscopy was then brought into the field and using C-arm fluoroscopy then a hole made into the middle of the pedicle of right L1 observed in the pedicle using ball tipped  nerve hook and hockey stick nerve probe initial entry was determined on fluoroscopy to be good position alignment so that a ball handled probe was then used to probe the right L1 pedicle opening to a depth of nearly 50 mm observed on C-arm fluoroscopy to be beyond the midpoint of the lumbar vertebra and then position alignment within the right L1 pedicle this was then removed and the pedicle channel probed demonstrating patency no sign of rupture the cortex of the pedicle. Tapping up to a 4.35 mm screw tap then a 5.0 mm x 45 mm screw was placed on the right side at the L1 level. C-arm fluoroscopy was then brought into the field and using C-arm fluoroscopy then a hole made into the lateral aspect of the left pedicle of L2 observed in the pedicle using ball tipped nerve hook and hockey stick nerve probe initial entry was determined on fluoroscopy to be good position alignment so that a ball handled probe was then used to probe the left L2 pedicle to a depth of nearly 45 mm observed on C-arm fluoroscopy to  be well aligned within the left L2 pedicle, the pedicle channel probed demonstrating patency no sign of rupture the cortex of the pedicle. Tapping with a 4.35 mm screw then  5.0 mm x 45 mm screw was placed  on the left side at the L2 level. C-arm fluoroscopy was used to localize the hole made in the lateral aspect of the pedicle of L2 on the right localizing the pedicle within the spinal canal with nerve hook and hockey-stick nerve probe carefully passed down the center of the L1 pedicle to a depth of nearly 50 mm. Observed on C-arm fluoroscopy to be in good position alignment channel was probed with a ball-tipped probe ensure patency no sign of cortical disruption. Following tapping with a 4.35 mm and then a 5.0 x 50 mm screw was placed  on the right side at the L2 level. ?Using C-arm fluoroscopy then a hole made into the central aspect of the left pedicle of L3 observed in the pedicle using ball-tipped nerve hook  and hockey stick nerve probe initial entry was determined on fluoroscopy to be good position alignment so that a ball handled probe was then used to probe the left L3 pedicle to a depth of nearly 50 mm obse

## 2021-04-28 NOTE — Brief Op Note (Signed)
04/28/2021 ? ?2:14 PM ? ?PATIENT:  Kristen [name]  56 y.o. female ? ?PRE-OPERATIVE DIAGNOSIS:  pseudarthrosis lumbar fusion Lumbar three-four, painful orthopedic hardware with loosening of Lumbar three screws, thoracolumbar fusion Toracic five-Sacral one ? ?POST-OPERATIVE DIAGNOSIS:  pseudarthrosis lumbar fusion Lumbar three-four, painful orthopedic hardware with loosening of Lumbar three screws, thoracolumbar fusion Toracic five-Sacral one ? ?PROCEDURE:  Procedure(s): ?REMOVAL OF RODS AND SCREWS LUMBAR THREE TO SACRAL ONE, RIGHT LUMBAR THREE-FOUR LUMBAR INTERBODY FUSION,  INSTRUMENTATION EXTENDED TO THOACIC TWELVE LEVEL, PEDICLE SCREWS, RODS, CAGE, LOCAL BONE GRAFT, ALLOGRAFT, BMP (N/A) ? ?SURGEON:  Surgeon(s) and Role: ?   * Jessy Oto, MD - Primary ? ?PHYSICIAN ASSISTANT: Benjiman Core, PA-C ? ?ANESTHESIA:   local and general ? ?EBL:  400 mL  ? ?BLOOD ADMINISTERED: 170 CC CELLSAVER ? ?DRAINS: (1) Hemovact drain(s) in the left lumbar with  Suction Clamped  ? ?LOCAL MEDICATIONS USED:  MARCAINE 0.5% 1:1 EXPAREL 1.3%  Amount: 30 ml ? ?SPECIMEN:  No Specimen ? ?DISPOSITION OF SPECIMEN:  N/A ? ?COUNTS:  YES ? ?TOURNIQUET:  * No tourniquets in log * ? ?DICTATION: .Dragon Dictation ? ?PLAN OF CARE: Admit to inpatient  ? ?PATIENT DISPOSITION:  PACU - hemodynamically stable. ?  ?Delay start of Pharmacological VTE agent (>24hrs) due to surgical blood loss or risk of bleeding: yes ? ?

## 2021-04-28 NOTE — Interval H&P Note (Signed)
History and Physical Interval Note: ? ?04/28/2021 ?7:36 AM ? ?Kristen [name]  has presented today for surgery, with the diagnosis of pseudarthrosis lumbar fusion L3-4, painful orthopedic hardware with loosening of L3 screws, thoracolumbar fusion T5-S1.  The various methods of treatment have been discussed with the patient and family. After consideration of risks, benefits and other options for treatment, the patient has consented to  Procedure(s) with comments: ?REMOVAL OF RODS AND SCREWS L3 TO S1, RIGHT L3-4 LUMBAR INTERBODY FUSION,  INSTRUMENTATION EXTENDED TO T12 LEVEL, PEDICLE SCREWS, RODS, CAGE, LOCAL BONE GRAFT, ALLOGRAFT, BMP (N/A) - Need RNFA until approx 11 a.m. as a surgical intervention.  The patient's history has been reviewed, patient examined, no change in status, stable for surgery.  I have reviewed the patient's chart and labs.  Questions were answered to the patient's satisfaction.   ? ? ?Basil Dess ? ? ?

## 2021-04-28 NOTE — Anesthesia Procedure Notes (Addendum)
Arterial Line Insertion ?Start/End4/11/2021 7:00 AM, 04/28/2021 7:14 AM ?Performed by: Josephine Igo, CRNA, CRNA ? Patient location: Pre-op. ?Preanesthetic checklist: patient identified, IV checked, risks and benefits discussed, surgical consent and pre-op evaluation ?Lidocaine 1% used for infiltration ?Right, radial was placed ?Catheter size: 20 G ?Hand hygiene performed  and maximum sterile barriers used  ? ?Attempts: 1 ?Procedure performed without using ultrasound guided technique. ?Following insertion, dressing applied and Biopatch. ?Post procedure assessment: normal ? ?Patient tolerated the procedure well with no immediate complications. ? ? ?

## 2021-04-28 NOTE — Anesthesia Procedure Notes (Addendum)
Procedure Name: Intubation ?Date/Time: 04/28/2021 8:04 AM ?Performed by: Jenne Campus, CRNA ?Pre-anesthesia Checklist: Patient identified, Emergency Drugs available, Suction available and Patient being monitored ?Patient Re-evaluated:Patient Re-evaluated prior to induction ?Oxygen Delivery Method: Circle System Utilized ?Preoxygenation: Pre-oxygenation with 100% oxygen ?Induction Type: IV induction ?Ventilation: Mask ventilation without difficulty ?Laryngoscope Size: Sabra Heck and 3 ?Grade View: Grade I ?Tube type: Oral ?Tube size: 7.0 mm ?Number of attempts: 1 ?Airway Equipment and Method: Stylet and Oral airway ?Placement Confirmation: ETT inserted through vocal cords under direct vision, positive ETCO2 and breath sounds checked- equal and bilateral ?Secured at: 21 cm ?Tube secured with: Tape ?Dental Injury: Teeth and Oropharynx as per pre-operative assessment  ? ? ? ? ?

## 2021-04-29 DIAGNOSIS — E43 Unspecified severe protein-calorie malnutrition: Secondary | ICD-10-CM | POA: Insufficient documentation

## 2021-04-29 LAB — CBC
HCT: 28.7 % — ABNORMAL LOW (ref 36.0–46.0)
Hemoglobin: 9.6 g/dL — ABNORMAL LOW (ref 12.0–15.0)
MCH: 28.7 pg (ref 26.0–34.0)
MCHC: 33.4 g/dL (ref 30.0–36.0)
MCV: 85.7 fL (ref 80.0–100.0)
Platelets: 159 10*3/uL (ref 150–400)
RBC: 3.35 MIL/uL — ABNORMAL LOW (ref 3.87–5.11)
RDW: 13.9 % (ref 11.5–15.5)
WBC: 5.3 10*3/uL (ref 4.0–10.5)
nRBC: 0 % (ref 0.0–0.2)

## 2021-04-29 LAB — BASIC METABOLIC PANEL
Anion gap: 4 — ABNORMAL LOW (ref 5–15)
BUN: 5 mg/dL — ABNORMAL LOW (ref 6–20)
CO2: 25 mmol/L (ref 22–32)
Calcium: 8.2 mg/dL — ABNORMAL LOW (ref 8.9–10.3)
Chloride: 110 mmol/L (ref 98–111)
Creatinine, Ser: 0.54 mg/dL (ref 0.44–1.00)
GFR, Estimated: >60 mL/min (ref >60)
Glucose, Bld: 123 mg/dL — ABNORMAL HIGH (ref 70–99)
Potassium: 4 mmol/L (ref 3.5–5.1)
Sodium: 139 mmol/L (ref 135–145)

## 2021-04-29 LAB — GLUCOSE, CAPILLARY: Glucose-Capillary: 107 mg/dL — ABNORMAL HIGH (ref 70–99)

## 2021-04-29 MED ORDER — FREE WATER
50.0000 mL | Freq: Four times a day (QID) | Status: DC
  Administered 2021-04-29 – 2021-05-01 (×8): 50 mL

## 2021-04-29 MED ORDER — KATE FARMS STANDARD 1.4 PO LIQD
325.0000 mL | Freq: Two times a day (BID) | ORAL | Status: DC
  Administered 2021-04-29 – 2021-04-30 (×3): 325 mL via ORAL
  Filled 2021-04-29 (×5): qty 325

## 2021-04-29 MED ORDER — ADULT MULTIVITAMIN W/MINERALS CH
1.0000 | ORAL_TABLET | Freq: Every day | ORAL | Status: DC
Start: 2021-04-29 — End: 2021-05-01
  Administered 2021-04-29 – 2021-05-01 (×3): 1
  Filled 2021-04-29 (×3): qty 1

## 2021-04-29 MED ORDER — JEVITY 1.5 CAL/FIBER PO LIQD
355.0000 mL | Freq: Four times a day (QID) | ORAL | Status: DC
  Administered 2021-04-29: 120 mL
  Administered 2021-04-29: 355 mL
  Administered 2021-04-29: 120 mL
  Administered 2021-04-30 – 2021-05-01 (×5): 355 mL
  Filled 2021-04-29 (×11): qty 474

## 2021-04-29 NOTE — TOC Initial Note (Signed)
Transition of Care (TOC) - Initial/Assessment Note  ? ? ?Patient Details  ?Name: Kristen [name] ?MRN: [id number] ?Date of Birth: 1965-01-23 ? ?Transition of Care (TOC) CM/SW Contact:    ?Pollie Friar, RN ?Phone Number: ?04/29/2021, 1:32 PM ? ?Clinical Narrative:                 ?Patient is from home with family. She denies any issues with transportation or home medications.  ?Orders for home health PT. PT/OT with no f/u recommendations and pt doesn't feel she needs therapy at home. 3 n 1 ordered through [facility] and will be delivered to the room. Pt already has a walker at home.  ?RD saw pt and recommending home tube feedings d/t malnutrition. Patient asked to use [facility] for the tube feedings. CM has updated  Zack with [facility]. CM has sent message to Dr Louanne Skye to see about signing on tube feeds or CM may have to reach out to PCP.  ?TOC following. ? ?Expected Discharge Plan: Home/Self Care ?Barriers to Discharge: Continued Medical Work up ? ? ?Patient Goals and CMS Choice ?  ?CMS Medicare.gov Compare Post Acute Care list provided to:: Patient ?Choice offered to / list presented to : Patient ? ?Expected Discharge Plan and Services ?Expected Discharge Plan: Home/Self Care ?  ?Discharge Planning Services: CM Consult ?Post Acute Care Choice: Durable Medical Equipment ?Living arrangements for the past 2 months: [place] ?                ?DME Arranged: 3-N-1 ?DME Agency: [facility] ?Date DME Agency Contacted: 04/29/21 ?  ?Representative spoke with at DME Agency: [employer] ?  ?  ?  ?  ?  ? ?Prior Living Arrangements/Services ?Living arrangements for the past 2 months: [place] ?Lives with:: Siblings, Parents ?Patient language and need for interpreter reviewed:: Yes ?Do you feel safe going back to the place where you live?: Yes      ?  ?Care giver support system in place?: Yes (comment) ?Current home services: DME (walker/ rollator) ?Criminal Activity/Legal Involvement Pertinent to Current  Situation/Hospitalization: No - Comment as needed ? ?Activities of Daily Living ?Home Assistive Devices/Equipment: None ?ADL Screening (condition at time of admission) ?Patient's cognitive ability adequate to safely complete daily activities?: Yes ?Is the patient deaf or have difficulty hearing?: No ?Does the patient have difficulty seeing, even when wearing glasses/contacts?: No ?Does the patient have difficulty concentrating, remembering, or making decisions?: No ?Patient able to express need for assistance with ADLs?: Yes ?Does the patient have difficulty dressing or bathing?: Yes ?Independently performs ADLs?: Yes (appropriate for developmental age) ?Does the patient have difficulty walking or climbing stairs?: No ?Weakness of Legs: Right ?Weakness of Arms/Hands: None ? ?Permission Sought/Granted ?  ?  ?   ?   ?   ?   ? ?Emotional Assessment ?Appearance:: Appears stated age ?Attitude/Demeanor/Rapport: Engaged ?Affect (typically observed): Accepting ?Orientation: : Oriented to Self, Oriented to Place, Oriented to  Time, Oriented to Situation ?  ?Psych Involvement: No (comment) ? ?Admission diagnosis:  Pseudoarthrosis of lumbar spine [S32.009K] ?Fusion of spine of lumbar region [M43.26] ?Patient Active Problem List  ? Diagnosis Date Noted  ? Pseudoarthrosis of lumbar spine 04/28/2021  ? Fusion of spine of lumbar region 04/28/2021  ? Pseudarthrosis after fusion or arthrodesis 04/20/2021  ?  Class: Chronic  ? Tenosynovitis, de Quervain 12/14/2020  ? Arthritis of carpometacarpal [facility]) joint of left thumb 12/14/2020  ? Aortic atherosclerosis ([place]) 08/20/2020  ? Hyperkalemia  07/24/2020  ? Alcohol use 08/05/2019  ? Bilateral lower extremity edema 08/05/2019  ? Peripheral edema 03/12/2019  ? Iron deficiency anemia 12/11/2018  ? Loosening of hardware in spine [facility])   ? Other spondylosis with radiculopathy, lumbar region   ? History of lumbar spinal fusion 11/06/2018  ? UTI (urinary tract infection) 05/15/2017  ? 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 Malignant neoplasm of base of tongue ([place]) 10/29/2016  ? 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[place]) 10/29/2016  ? Headache associated with sexual activity 05/14/2016  ? Tongue lesion 05/14/2016  ? Thrombocytopenia ([place]) 01/22/2016  ? Chronic diarrhea 01/22/2016  ? Chest pain   ? Essential hypertension   ? Spinal stenosis of lumbar region 01/20/2016  ?  Class: Chronic  ? DDD (degenerative disc disease), lumbar 01/20/2016  ?  Class: Chronic  ? Spinal stenosis of lumbar region with neurogenic claudication 01/20/2016  ? Low back pain 12/22/2015  ? Hypersomnolence 10/13/2015  ? Muscle cramp 08/01/2015  ? GERD (gastroesophageal reflux disease) 07/09/2015  ? Morbid obesity with BMI of 40.0-44.9, adult ([place]) 01/08/2013  ? ?PCP:  [name], MD ?Pharmacy:   ?[id number] - [place], [place] ?[address] ?[place] [address] ?Phone: [phone] Fax: [phone] ? ? ? ? ?Social Determinants of Health (SDOH) Interventions ?  ? ?Readmission Risk Interventions ?   ? View : No data to display.  ?  ?  ?  ? ? ? ?

## 2021-04-29 NOTE — Progress Notes (Signed)
? ? ? ?  Subjective: ?1 Day Post-Op Procedure(s) (LRB): ?REMOVAL OF RODS AND SCREWS LUMBAR THREE TO SACRAL ONE, LUMBAR THREE-FOUR LUMBAR INTERBODY FUSION,  INSTRUMENTATION EXTENDED TO THOACIC TWELVE LEVEL, PEDICLE SCREWS, RODS, CAGE, LOCAL BONE GRAFT, ALLOGRAFT, BMP (N/A) weight loss post partial tongue resection, has Gtube but seldome uses. Not able to tolerate chocolate ensure. Nutrition consult today appreciated. No leg pain or numbness. Stood today and few steps, brace in room. Keep hemovac one more day.  ? ?Patient reports pain as moderate.   ? ?Objective:  ? ?VITALS:  Temp:  [96.7 ?F (35.9 ?C)-99.2 ?F (37.3 ?C)] 99.2 ?F (37.3 ?C) (04/12 4010) ?Pulse Rate:  [60-78] 78 (04/12 0711) ?Resp:  [10-20] 18 (04/12 0711) ?BP: (97-133)/(67-98) 101/67 (04/12 2725) ?SpO2:  [98 %-100 %] 98 % (04/12 0711) ?Arterial Line BP: (120-134)/(68-83) 120/68 (04/11 1535) ? ?Neurologically intact ?ABD soft ?Neurovascular intact ?Sensation intact distally ?Intact pulses distally ?Dorsiflexion/Plantar flexion intact ?Incision: dressing C/D/I and drain with moderate drainage ? ? ?LABS ?Recent Labs  ?  04/29/21 ?0438  ?HGB 9.6*  ?WBC 5.3  ?PLT 159  ? ?Recent Labs  ?  04/29/21 ?0438  ?NA 139  ?K 4.0  ?CL 110  ?CO2 25  ?BUN 5*  ?CREATININE 0.54  ?GLUCOSE 123*  ? ?No results for input(s): LABPT, INR in the last 72 hours. ? ? ?Assessment/Plan: ?1 Day Post-Op Procedure(s) (LRB): ?REMOVAL OF RODS AND SCREWS LUMBAR THREE TO SACRAL ONE, LUMBAR THREE-FOUR LUMBAR INTERBODY FUSION,  INSTRUMENTATION EXTENDED TO THOACIC TWELVE LEVEL, PEDICLE SCREWS, RODS, CAGE, LOCAL BONE GRAFT, ALLOGRAFT, BMP (N/A) ? ?Advance diet ?Up with therapy ?Nutrition recommendations for tube feedings in addition to oral nourishment.  ? ? ?Basil Dess ?04/29/2021, 1:48 PM  ?

## 2021-04-29 NOTE — Evaluation (Signed)
Physical Therapy Evaluation ?Patient Details ?Name: Kristen [name] ?MRN: [id number] ?DOB: [birth year] ?Today's Date: 04/29/2021 ? ?History of Present Illness ? 56yo s/p removal of hardware L3-S1; lumbar interbody fusion with instrumentation extended to thoracic 12 level, pedicle screws, rods, cage. PMH: previous back surgery, excision of part of tongue due to tongue/head and neck cancer s/p trach and peg (continues to use Peg for medicines), HTN, SOB.  ?Clinical Impression ? Pt admitted with/for lumbar surgery described above.  Pt is mobilizing at a supervision level overall and should progress well overall.  Pt currently limited functionally due to the problems listed below.  (see problems list.)  Pt will benefit from PT to maximize function and safety to be able to get home safely with available assist . ?   ?   ? ?Recommendations for follow up therapy are one component of a multi-disciplinary discharge planning process, led by the attending physician.  Recommendations may be updated based on patient status, additional functional criteria and insurance authorization. ? ?Follow Up Recommendations No PT follow up ? ?  ?Assistance Recommended at Discharge PRN  ?Patient can return home with the following ? A lot of help with bathing/dressing/bathroom;Assistance with cooking/housework;Assist for transportation ? ?  ?Equipment Recommendations BSC/3in1  ?Recommendations for Other Services ?    ?  ?Functional Status Assessment Patient has had a recent decline in their functional status and demonstrates the ability to make significant improvements in function in a reasonable and predictable amount of time.  ? ?  ?Precautions / Restrictions Precautions ?Precautions: Back  ? ?  ? ?Mobility ? Bed Mobility ?  ?  ?  ?  ?  ?  ?  ?General bed mobility comments: OOB in recliner on arrival.  Back to the chair after session. ?  ? ?Transfers ?Overall transfer level: Needs assistance ?Equipment used: Rolling walker (2  wheels) ?Transfers: Sit to/from Stand ?Sit to Stand: Min guard ?  ?  ?  ?  ?  ?General transfer comment: used UE's appropriately ?  ? ?Ambulation/Gait ?Ambulation/Gait assistance: Supervision ?Gait Distance (Feet): 250 Feet ?  ?Gait Pattern/deviations: Step-through pattern ?  ?Gait velocity interpretation: 1.31 - 2.62 ft/sec, indicative of limited community ambulator ?  ?General Gait Details: generally steady with the RW.  Slower ? ?Stairs ?  ?  ?  ?  ?  ? ?Wheelchair Mobility ?  ? ?Modified Rankin (Stroke Patients Only) ?  ? ?  ? ?Balance Overall balance assessment: Mild deficits observed, not formally tested ?  ?  ?  ?  ?  ?  ?  ?  ?  ?  ?  ?  ?  ?  ?  ?  ?  ?  ?   ? ? ? ?Pertinent Vitals/Pain Pain Assessment ?Pain Assessment: Faces ?Faces Pain Scale: Hurts little more ?Pain Location: back ?Pain Descriptors / Indicators: Aching, Discomfort ?Pain Intervention(s): Monitored during session  ? ? ?Home Living Family/patient expects to be discharged to:: Private residence ?Living Arrangements: Parent;Other relatives (brother and 2 grandsons) ?Available Help at Discharge: Available 24 hours/day ?Type of Home: House ?Home Access: Level entry ?  ?  ?  ?Home Layout: One level ?Home Equipment: Conservation officer, nature (2 wheels) ?   ?  ?Prior Function Prior Level of Function : Independent/Modified Independent;Driving ?  ?  ?  ?  ?  ?  ?  ?  ?  ? ? ?Hand Dominance  ? Dominant Hand: Right ? ?  ?Extremity/Trunk Assessment  ? Upper Extremity Assessment ?  Upper Extremity Assessment: Overall WFL for tasks assessed ?  ? ?Lower Extremity Assessment ?Lower Extremity Assessment: Overall WFL for tasks assessed (bil general weakness) ?  ? ?Cervical / Trunk Assessment ?Cervical / Trunk Assessment: Back Surgery  ?Communication  ? Communication: Other (comment) (affected speech due to excision of tongue)  ?Cognition Arousal/Alertness: Awake/alert ?Behavior During Therapy: [facility] for tasks assessed/performed ?Overall Cognitive Status: Within Functional  Limits for tasks assessed ?  ?  ?  ?  ?  ?  ?  ?  ?  ?  ?  ?  ?  ?  ?  ?  ?  ?  ?  ? ?  ?General Comments General comments (skin integrity, edema, etc.): Reinforced all back education, bracing issues and progression of activity in hospital and after d/c ? ?  ?Exercises    ? ?Assessment/Plan  ?  ?PT Assessment Patient needs continued PT services  ?PT Problem List Decreased strength;Decreased activity tolerance;Decreased mobility;Pain ? ?   ?  ?PT Treatment Interventions DME instruction;Gait training;Stair training;Functional mobility training;Therapeutic activities;Patient/family education   ? ?PT Goals (Current goals can be found in the Care Plan section)  ?Acute Rehab PT Goals ?Patient Stated Goal: back home independent ?PT Goal Formulation: With patient ?Time For Goal Achievement: 05/13/21 ?Potential to Achieve Goals: Good ? ?  ?Frequency Min 5X/week ?  ? ? ?Co-evaluation   ?  ?  ?  ?  ? ? ?  ?AM-PAC PT "6 Clicks" Mobility  ?Outcome Measure Help needed turning from your back to your side while in a flat bed without using bedrails?: A Little ?Help needed moving from lying on your back to sitting on the side of a flat bed without using bedrails?: A Little ?Help needed moving to and from a bed to a chair (including a wheelchair)?: A Little ?Help needed standing up from a chair using your arms (e.g., wheelchair or bedside chair)?: A Little ?Help needed to walk in hospital room?: A Little ?Help needed climbing 3-5 steps with a railing? : A Little ?6 Click Score: 18 ? ?  ?End of Session   ?Activity Tolerance: Patient tolerated treatment well ?Patient left: in chair;with call bell/phone within reach ?Nurse Communication: Mobility status ?PT Visit Diagnosis: Other abnormalities of gait and mobility (R26.89);Pain ?Pain - part of body:  (back) ?  ? ?Time: 2010-0712 ?PT Time Calculation (min) (ACUTE ONLY): 23 min ? ? ?Charges:   PT Evaluation ?$PT Eval Low Complexity: 1 Low ?PT Treatments ?$Gait Training: 8-22 mins ?  ?    ? ? ?04/29/2021 ? ?Ginger Carne., PT ?Acute Rehabilitation Services ?[phone]  (pager) ?[phone]  (office) ? ?Tessie Fass Najir Roop ?04/29/2021, 10:51 AM ? ?

## 2021-04-29 NOTE — Plan of Care (Signed)

## 2021-04-29 NOTE — Progress Notes (Signed)
Initial Nutrition Assessment ? ?DOCUMENTATION CODES:  ?Severe malnutrition in context of chronic illness ? ?INTERVENTION:  ?Utilize g-tube for enteral nutrition. Recommend the following: ?1.5 cartons of Jevity 1.5 QID (6 cans daily) via g-tube ?Flush with 37m of water before and after each bolus feed  ?This will provide 2130kcal, 91g of protein, 14830mof water (TF+flush) and 142257mf formula a day. ?On day of initiation, bolus 1/2 carton (~120m51mt each feed, increase to 1 carton on day 2 (237mL42md then to goal of 1.5 cartons (355mL)56meach feed on day 3 ?Continue oral diet for pleasure ?Trial of Kate FAnda Kraft 1.4 PO BID, each supplement provides 455 kcal and 20 grams protein. ?MVI with minerals via tube daily ? ?NUTRITION DIAGNOSIS:  ?Severe Malnutrition (in the context of chronic illness) related to  (inadequate oral intake due to swallowing issues) as evidenced by severe fat depletion, severe muscle depletion, energy intake < or equal to 75% for > or equal to 1 month. ? ?GOAL:  ?Patient will meet greater than or equal to 90% of their needs ? ?MONITOR:  ?TF tolerance, Weight trends, I & O's, Labs ? ?REASON FOR ASSESSMENT:  ?Consult ?Assessment of nutrition requirement/status, Poor PO (Partial tongue resection for oral cancer, now with weight loss has a feeding tube and takes in oral nourishment, need to improve her metabolic state) ? ?ASSESSMENT:  ?55 y.o28female with hx of HTN, GERD, anemia, tongue cancer (dx 2019 s/p radiation and partial glossectomy), g-tube placement, and hiatal hernia presented for planned spine surgery. ? ?4/11 - Op, Spine surgery, removal of rods and screw, lumbar fusion ? ?Pt resting in bedside chair at the time of assessment. Discussed her intake and current nutrition status. Pt has had a g-tube in place since she was dx with tongue cancer in 2018. Pt reports that she continues to utilize the tube for medications. Since her partial glossectomy and radiation, pt reports she can only  swallow on one side of her throat and has had poor appetite and taste changes.  ? ?Diet is extremely limited due to her lack of dentition and swallowing difficulty. Pt reports she mostly consumes mac and cheese, mashed potatoes, grits, eggs, and bananas. Will sometimes add in other foods, but has to be careful about how they are prepared or she will mix them into one of her normal foods to make a bolus that is easier to swallow. Pt reports that typically she eats 1x/d but attempts to eat two. Does not feel normal hunger signs consistently. Oral intake is significantly inadequate to meet estimated. Severe muscle and fat deficits present on exam. Pt reports that she has tried nutrition supplements in the past but they cause diarrhea.  ? ?MD recommends pt utilize her tube for nutrition as well as medications as she is severely malnourished and deconditioned. Pt agreeable with this plan and recognizes that she is not consuming adequate PO to meet needs. PO intake will be more for pleasure and TF will meet estimated needs for pt. Pt reports that when tube was initially placed, her insurance would not cover tube feeding formula so she has not been on it in several years. However, she has changed insurance since that time. Will start pt on bolus feeds as this will be utilized at home. Discussed with RN.  ? ?Nutritionally Relevant Medications: ?Scheduled Meds: ? baclofen  10 mg Per Tube TID  ? calcium citrate  950 mg Per Tube Q1200  ? cholecalciferol  400 Units Per Tube  Daily  ? docusate  100 mg Per Tube BID  ? ferrous sulfate  300 mg Oral Q breakfast  ? folic acid  007 mcg Per Tube Daily  ? pantoprazole sodium  40 mg Per Tube Daily  ? potassium chloride  20 mEq Per Tube BID  ? spironolactone  25 mg Per Tube BID  ? ?Continuous Infusions: ? methocarbamol (ROBAXIN) IV    ? ?PRN Meds: alum & mag hydroxide-simeth, bisacodyl, ondansetron, polyethylene glycol, sodium phosphate ? ?Labs Reviewed: ?BUN 5  ? ?NUTRITION - FOCUSED  PHYSICAL EXAM: ?Flowsheet Row Most Recent Value  ?Orbital Region Severe depletion  ?Upper Arm Region Moderate depletion  ?Thoracic and Lumbar Region Severe depletion  ?Buccal Region Severe depletion  ?Temple Region Severe depletion  ?Clavicle Bone Region Severe depletion  ?Clavicle and Acromion Bone Region Severe depletion  ?Scapular Bone Region Severe depletion  ?Dorsal Hand Mild depletion  ?Patellar Region Moderate depletion  ?Anterior Thigh Region Moderate depletion  ?Posterior Calf Region Severe depletion  ?Edema (RD Assessment) None  ?Hair Reviewed  ?Eyes Reviewed  ?Mouth Reviewed  ?Skin Reviewed  ?Nails Reviewed  ? ?Diet Order:   ?Diet Order   ? ?       ?  Diet regular Room service appropriate? Yes; Fluid consistency: Thin  Diet effective now       ?  ? ?  ?  ? ?  ? ? ?EDUCATION NEEDS:  ?Education needs have been addressed ? ?Skin:  Skin Assessment: Reviewed RN Assessment ? ?Last BM:  4/10 ? ?Height:  ?Ht Readings from Last 1 Encounters:  ?04/28/21 _0  (1.702 m)  ? ? ?Weight:  ?Wt Readings from Last 1 Encounters:  ?04/28/21 68.5 kg  ? ? ?Ideal Body Weight:  61.4 kg ? ?BMI:  Body mass index is 23.65 kg/m?. ? ?Estimated Nutritional Needs:  ?Kcal:  1800-2100 kcal/d ?Protein:  90-110 g/d ?Fluid:  >/=2 L/d ? ? ?Ranell Patrick, RD, LDN ?Clinical Dietitian ?RD pager # available in [place]  ?After hours/weekend pager # available in [place] ?

## 2021-04-29 NOTE — Evaluation (Signed)
Occupational Therapy Evaluation ?Patient Details ?Name: Kristen [name] ?MRN: [id number] ?DOB: 07/07/1965 ?Today's Date: 04/29/2021 ? ? ?History of Present Illness 55yo s/p removal of hardware L3-S1; lumbar interbody fusion with instrumentation extended to thoracic 12 level, pedicle screws, rods, cage. PMH: previous back surgery, excision of part of tongue due to tongue/head and neck cancer s/p trach and peg (continues to use Peg for medicines), HTN, SOB.  ? ?Clinical Impression ?  ?PTA pt lives independently with family and manages her medications through her peg tube. Began education on back precautions for ADL and mobility. Requires min A with LB ADL and minguard A with mobility. Contacted ortho tech regarding need for side extenders for brace to improve fit. Acute OT to follow to facilitate safe DC home. Do not anticipate need for OT follow up after DC. Pt very appreciative.  ?   ? ?Recommendations for follow up therapy are one component of a multi-disciplinary discharge planning process, led by the attending physician.  Recommendations may be updated based on patient status, additional functional criteria and insurance authorization.  ? ?Follow Up Recommendations ? No OT follow up  ?  ?Assistance Recommended at Discharge Intermittent Supervision/Assistance  ?Patient can return home with the following Assistance with cooking/housework;Assist for transportation ? ?  ?Functional Status Assessment ? Patient has had a recent decline in their functional status and demonstrates the ability to make significant improvements in function in a reasonable and predictable amount of time.  ?Equipment Recommendations ? BSC/3in1  ?  ?Recommendations for Other Services PT consult ? ? ?  ?Precautions / Restrictions Precautions ?Precautions: Back  ? ?  ? ?Mobility Bed Mobility ?Overal bed mobility: Needs Assistance ?Bed Mobility: Rolling, Sidelying to Sit ?Rolling: Supervision ?Sidelying to sit: Supervision ?  ?  ?  ?  ?   ? ?Transfers ?Overall transfer level: Needs assistance ?Equipment used: Rolling walker (2 wheels) ?Transfers: Sit to/from Stand ?Sit to Stand: Min guard ?  ?  ?  ?  ?  ?  ?  ? ?  ?Balance Overall balance assessment: Mild deficits observed, not formally tested ?  ?  ?  ?  ?  ?  ?  ?  ?  ?  ?  ?  ?  ?  ?  ?  ?  ?  ?   ? ?ADL either performed or assessed with clinical judgement  ? ?ADL Overall ADL's : Needs assistance/impaired ?  ?  ?Grooming: Supervision/safety;Set up ?  ?Upper Body Bathing: Supervision/ safety;Set up;Sitting ?  ?Lower Body Bathing: Minimal assistance;Sit to/from stand ?  ?Upper Body Dressing : Minimal assistance (brace) ?  ?Lower Body Dressing: Minimal assistance ?  ?Toilet Transfer: Min guard;Rolling walker (2 wheels) ?  ?Toileting- Clothing Manipulation and Hygiene: Minimal assistance ?  ?  ?  ?Functional mobility during ADLs: Min guard;Rolling walker (2 wheels);Cueing for safety ?General ADL Comments: May benefit from reacher and long handled sponge  ? ? ? ?Vision Baseline Vision/History: 1 Wears glasses ?   ?   ?Perception   ?  ?Praxis   ?  ? ?Pertinent Vitals/Pain Pain Assessment ?Pain Assessment: 0-10 ?Pain Score: 4  ?Pain Location: back ?Pain Descriptors / Indicators: Aching, Discomfort ?Pain Intervention(s): Limited activity within patient's tolerance  ? ? ? ?Hand Dominance Right ?  ?Extremity/Trunk Assessment Upper Extremity Assessment ?Upper Extremity Assessment: Overall WFL for tasks assessed ?  ?Lower Extremity Assessment ?Lower Extremity Assessment:  (had numbness in BLE at times, R>L prior to surgery) ?  ?Cervical /  Trunk Assessment ?Cervical / Trunk Assessment: Back Surgery ?  ?Communication Communication ?Communication: Other (comment) (affected speech due to excision of tongue) ?  ?Cognition Arousal/Alertness: Awake/alert ?Behavior During Therapy: [facility] for tasks assessed/performed ?Overall Cognitive Status: Within Functional Limits for tasks assessed ?  ?  ?  ?  ?  ?  ?  ?  ?  ?  ?   ?  ?  ?  ?  ?  ?  ?  ?  ?General Comments  brace tight around waist. Ortho tech conteacted adn to bring side extenders ? ?  ?Exercises   ?  ?Shoulder Instructions    ? ? ?Home Living Family/patient expects to be discharged to:: Private residence ?Living Arrangements: Parent;Other relatives (brother and 2 grandsons) ?Available Help at Discharge: Available 24 hours/day ?Type of Home: House ?Home Access: Level entry ?  ?  ?Home Layout: One level ?  ?  ?Bathroom Shower/Tub: Tub/shower unit;Curtain ?  ?Bathroom Toilet: Standard ?Bathroom Accessibility: Yes ?How Accessible: Accessible via walker ?Home Equipment: Conservation officer, nature (2 wheels) ?  ?  ?  ? ?  ?Prior Functioning/Environment Prior Level of Function : Independent/Modified Independent;Driving ?  ?  ?  ?  ?  ?  ?  ?  ?  ? ?  ?  ?OT Problem List: Decreased knowledge of use of DME or AE;Decreased knowledge of precautions;Decreased strength;Pain ?  ?   ?OT Treatment/Interventions: Self-care/ADL training;DME and/or AE instruction;Therapeutic activities;Patient/family education  ?  ?OT Goals(Current goals can be found in the care plan section) Acute Rehab OT Goals ?Patient Stated Goal: to be independent ?OT Goal Formulation: With patient ?Time For Goal Achievement: 05/13/21 ?Potential to Achieve Goals: Good  ?OT Frequency: Min 2X/week ?  ? ?Co-evaluation   ?  ?  ?  ?  ? ?  ?AM-PAC OT "6 Clicks" Daily Activity     ?Outcome Measure Help from another person eating meals?: None ?Help from another person taking care of personal grooming?: A Little ?Help from another person toileting, which includes using toliet, bedpan, or urinal?: A Little ?Help from another person bathing (including washing, rinsing, drying)?: A Little ?Help from another person to put on and taking off regular upper body clothing?: A Little ?Help from another person to put on and taking off regular lower body clothing?: A Little ?6 Click Score: 19 ?  ?End of Session Equipment Utilized During Treatment: Gait  belt;Rolling walker (2 wheels);Back brace ?Nurse Communication: Mobility status ? ?Activity Tolerance: Patient tolerated treatment well ?Patient left: in chair;with call bell/phone within reach ? ?OT Visit Diagnosis: Other abnormalities of gait and mobility (R26.89);Pain ?Pain - part of body:  (back)  ?              ?Time: 6948-5462 ?OT Time Calculation (min): 34 min ?Charges:  OT General Charges ?$OT Visit: 1 Visit ?OT Evaluation ?$OT Eval Low Complexity: 1 Low ?OT Treatments ?$Self Care/Home Management : 8-22 mins ? ?[facility], OT/L  ? ?Acute OT Clinical Specialist ?Acute Rehabilitation Services ?Pager [phone] ?Office [phone]  ? ?Murrel Freet,HILLARY ?04/29/2021, 9:48 AM ?

## 2021-04-29 NOTE — Anesthesia Postprocedure Evaluation (Signed)
Anesthesia Post Note ? ?Patient: Kristen [name] ? ?Procedure(s) Performed: REMOVAL OF RODS AND SCREWS LUMBAR THREE TO SACRAL ONE, LUMBAR THREE-FOUR LUMBAR INTERBODY FUSION,  INSTRUMENTATION EXTENDED TO THOACIC TWELVE LEVEL, PEDICLE SCREWS, RODS, CAGE, LOCAL BONE GRAFT, ALLOGRAFT, BMP (Spine Lumbar) ? ?  ? ?Patient location during evaluation: PACU ?Anesthesia Type: General ?Level of consciousness: awake and alert ?Pain management: pain level controlled ?Vital Signs Assessment: post-procedure vital signs reviewed and stable ?Respiratory status: spontaneous breathing, nonlabored ventilation, respiratory function stable and patient connected to nasal cannula oxygen ?Cardiovascular status: blood pressure returned to baseline and stable ?Postop Assessment: no apparent nausea or vomiting ?Anesthetic complications: no ? ? ?No notable events documented. ? ?Last Vitals:  ?Vitals:  ? 04/29/21 1500 04/29/21 1955  ?BP: 105/72 (!) 102/56  ?Pulse: 81 95  ?Resp: 18 18  ?Temp: 37.1 ?C 37.2 ?C  ?SpO2: 98% 98%  ?  ?Last Pain:  ?Vitals:  ? 04/29/21 1955  ?TempSrc: Oral  ?PainSc:   ? ? ?  ?  ?  ?  ?  ?  ? ?[name] ? ? ? ? ?

## 2021-04-30 LAB — BASIC METABOLIC PANEL
Anion gap: 6 (ref 5–15)
BUN: 10 mg/dL (ref 6–20)
CO2: 28 mmol/L (ref 22–32)
Calcium: 8.4 mg/dL — ABNORMAL LOW (ref 8.9–10.3)
Chloride: 105 mmol/L (ref 98–111)
Creatinine, Ser: 0.64 mg/dL (ref 0.44–1.00)
GFR, Estimated: >60 mL/min (ref >60)
Glucose, Bld: 97 mg/dL (ref 70–99)
Potassium: 3.9 mmol/L (ref 3.5–5.1)
Sodium: 139 mmol/L (ref 135–145)

## 2021-04-30 LAB — CBC WITH DIFFERENTIAL/PLATELET
Abs Immature Granulocytes: 0.03 10*3/uL (ref 0.00–0.07)
Basophils Absolute: 0 10*3/uL (ref 0.0–0.1)
Basophils Relative: 0 %
Eosinophils Absolute: 0 10*3/uL (ref 0.0–0.5)
Eosinophils Relative: 1 %
HCT: 29.1 % — ABNORMAL LOW (ref 36.0–46.0)
Hemoglobin: 9.4 g/dL — ABNORMAL LOW (ref 12.0–15.0)
Immature Granulocytes: 1 %
Lymphocytes Relative: 27 %
Lymphs Abs: 1.5 10*3/uL (ref 0.7–4.0)
MCH: 28.7 pg (ref 26.0–34.0)
MCHC: 32.3 g/dL (ref 30.0–36.0)
MCV: 88.7 fL (ref 80.0–100.0)
Monocytes Absolute: 0.4 10*3/uL (ref 0.1–1.0)
Monocytes Relative: 7 %
Neutro Abs: 3.6 10*3/uL (ref 1.7–7.7)
Neutrophils Relative %: 64 %
Platelets: 158 10*3/uL (ref 150–400)
RBC: 3.28 MIL/uL — ABNORMAL LOW (ref 3.87–5.11)
RDW: 14.4 % (ref 11.5–15.5)
WBC: 5.5 10*3/uL (ref 4.0–10.5)
nRBC: 0 % (ref 0.0–0.2)

## 2021-04-30 LAB — GLUCOSE, CAPILLARY: Glucose-Capillary: 133 mg/dL — ABNORMAL HIGH (ref 70–99)

## 2021-04-30 LAB — MAGNESIUM: Magnesium: 1.8 mg/dL (ref 1.7–2.4)

## 2021-04-30 LAB — PHOSPHORUS: Phosphorus: 2.4 mg/dL — ABNORMAL LOW (ref 2.5–4.6)

## 2021-04-30 NOTE — Progress Notes (Signed)
Physical Therapy Treatment ?Patient Details ?Name: Kristen [name] ?MRN: [id number] ?DOB: Nov 26, 1965 ?Today's Date: 04/30/2021 ? ? ?History of Present Illness 55yo s/p removal of hardware L3-S1; lumbar interbody fusion with instrumentation extended to thoracic 12 level, pedicle screws, rods, cage. PMH: previous back surgery, excision of part of tongue due to tongue/head and neck cancer s/p trach and peg (continues to use Peg for medicines), HTN, SOB. ? ?  ?PT Comments  ? ? Pt has progressed well toward all goals at a supervision level.  Emphasis today of finalizing education, progression of gait stability/coordination and safe negotiation of stairs.     ?Recommendations for follow up therapy are one component of a multi-disciplinary discharge planning process, led by the attending physician.  Recommendations may be updated based on patient status, additional functional criteria and insurance authorization. ? ?Follow Up Recommendations ? No PT follow up ?  ?  ?Assistance Recommended at Discharge PRN  ?Patient can return home with the following Two people to help with bathing/dressing/bathroom;Assistance with cooking/housework;Help with stairs or ramp for entrance;Assist for transportation ?  ?Equipment Recommendations ?    ?  ?Recommendations for Other Services   ? ? ?  ?Precautions / Restrictions Precautions ?Precautions: Back ?Required Braces or Orthoses: Spinal Brace ?Spinal Brace: Thoracolumbosacral orthotic;Applied in sitting position  ?  ? ?Mobility ? Bed Mobility ?Overal bed mobility: Needs Assistance ?Bed Mobility: Rolling, Sidelying to Sit ?Rolling: Supervision ?Sidelying to sit: Supervision ?  ?  ?  ?  ?  ? ?Transfers ?Overall transfer level: Needs assistance ?Equipment used: Rolling walker (2 wheels) ?Transfers: Sit to/from Stand ?Sit to Stand: Supervision ?  ?  ?  ?  ?  ?General transfer comment: used UE's appropriately ?  ? ?Ambulation/Gait ?Ambulation/Gait assistance: Supervision ?Gait Distance (Feet):  400 Feet ?Assistive device: Rolling walker (2 wheels) ?Gait Pattern/deviations: Step-through pattern ?  ?Gait velocity interpretation: >2.62 ft/sec, indicative of community ambulatory ?  ?General Gait Details: steady and able to significantly increase her speed to cue. ? ? ?Stairs ?Stairs: Yes ?Stairs assistance: Supervision ?Stair Management: One rail Right, Step to pattern, Forwards ?Number of Stairs: 6 ?General stair comments: safe with the rail ? ? ?Wheelchair Mobility ?  ? ?Modified Rankin (Stroke Patients Only) ?  ? ? ?  ?Balance   ?  ?  ?  ?  ?  ?  ?  ?  ?  ?  ?  ?  ?  ?  ?  ?  ?  ?  ?  ? ?  ?Cognition Arousal/Alertness: Awake/alert ?Behavior During Therapy: [facility] for tasks assessed/performed ?Overall Cognitive Status: Within Functional Limits for tasks assessed ?  ?  ?  ?  ?  ?  ?  ?  ?  ?  ?  ?  ?  ?  ?  ?  ?  ?  ?  ? ?  ?Exercises   ? ?  ?General Comments   ?  ?  ? ?Pertinent Vitals/Pain Pain Assessment ?Pain Assessment: Faces ?Faces Pain Scale: Hurts little more ?Pain Location: back ?Pain Descriptors / Indicators: Aching, Discomfort ?Pain Intervention(s): Monitored during session  ? ? ?Home Living   ?  ?  ?  ?  ?  ?  ?  ?  ?  ?   ?  ?Prior Function    ?  ?  ?   ? ?PT Goals (current goals can now be found in the care plan section) Acute Rehab PT Goals ?PT Goal Formulation: With patient ?  Time For Goal Achievement: 05/13/21 ?Potential to Achieve Goals: Good ?Progress towards PT goals: Progressing toward goals ? ?  ?Frequency ? ? ? Min 5X/week ? ? ? ?  ?PT Plan Current plan remains appropriate  ? ? ?Co-evaluation   ?  ?  ?  ?  ? ?  ?AM-PAC PT "6 Clicks" Mobility   ?Outcome Measure ? Help needed turning from your back to your side while in a flat bed without using bedrails?: A Little ?Help needed moving from lying on your back to sitting on the side of a flat bed without using bedrails?: A Little ?Help needed moving to and from a bed to a chair (including a wheelchair)?: A Little ?Help needed standing up from a  chair using your arms (e.g., wheelchair or bedside chair)?: A Little ?Help needed to walk in hospital room?: A Little ?Help needed climbing 3-5 steps with a railing? : A Little ?6 Click Score: 18 ? ?  ?End of Session   ?Activity Tolerance: Patient tolerated treatment well ?Patient left: in chair;with call bell/phone within reach ?Nurse Communication: Mobility status ?PT Visit Diagnosis: Other abnormalities of gait and mobility (R26.89);Pain ?  ? ? ?Time: 8887-5797 ?PT Time Calculation (min) (ACUTE ONLY): 22 min ? ?Charges:  $Gait Training: 8-22 mins          ?          ? ?04/30/2021 ? ?Ginger Carne., PT ?Acute Rehabilitation Services ?[phone]  (pager) ?[phone]  (office) ? ? ?Kristen [name] ?04/30/2021, 4:27 PM ? ?

## 2021-04-30 NOTE — Progress Notes (Signed)
? ? ? ?  Subjective: ?2 Days Post-Op Procedure(s) (LRB): ?REMOVAL OF RODS AND SCREWS LUMBAR THREE TO SACRAL ONE, LUMBAR THREE-FOUR LUMBAR INTERBODY FUSION,  INSTRUMENTATION EXTENDED TO THOACIC TWELVE LEVEL, PEDICLE SCREWS, RODS, CAGE, LOCAL BONE GRAFT, ALLOGRAFT, BMP (N/A)Progressing in PT. Peg tube feedings are proceeding. Check lab today anemia is stable. Low BP, held antihypertensive agents, norvasc. May be [name] tomorrow to go home with [facility] for nutritional needs and tube feedings and nursing care observe BP.  ? ?Patient reports pain as moderate.   ? ?Objective:  ? ?VITALS:  Temp:  [98.3 ?F (36.8 ?C)-99 ?F (37.2 ?C)] 98.3 ?F (36.8 ?C) (04/13 1615) ?Pulse Rate:  [84-103] 103 (04/13 1615) ?Resp:  [16-19] 18 (04/13 1615) ?BP: (87-107)/(56-71) 101/71 (04/13 1615) ?SpO2:  [98 %-100 %] 98 % (04/13 1615) ?Weight:  [68.6 kg] 68.6 kg (04/13 0450) ? ?Neurologically intact ?ABD soft ?Neurovascular intact ?Sensation intact distally ?Intact pulses distally ?Dorsiflexion/Plantar flexion intact ?Incision: dressing C/D/I, scant drainage, and Hemovac removed today.  ? ? ?LABS ?Recent Labs  ?  04/29/21 ?0438 04/30/21 ?0922  ?HGB 9.6* 9.4*  ?WBC 5.3 5.5  ?PLT 159 158  ? ?Recent Labs  ?  04/29/21 ?0438 04/30/21 ?0922  ?NA 139 139  ?K 4.0 3.9  ?CL 110 105  ?CO2 25 28  ?BUN 5* 10  ?CREATININE 0.54 0.64  ?GLUCOSE 123* 97  ? ?No results for input(s): LABPT, INR in the last 72 hours. ? ? ?Assessment/Plan: ?2 Days Post-Op Procedure(s) (LRB): ?REMOVAL OF RODS AND SCREWS LUMBAR THREE TO SACRAL ONE, LUMBAR THREE-FOUR LUMBAR INTERBODY FUSION,  INSTRUMENTATION EXTENDED TO THOACIC TWELVE LEVEL, PEDICLE SCREWS, RODS, CAGE, LOCAL BONE GRAFT, ALLOGRAFT, BMP (N/A) ?Anemia due to blood loss ?Malnutrition due to dysphagia and poor po intake secondary to partial glossectomy history. ? ?Advance diet ?Up with therapy ?D/C IV fluids ?Arrangments for [facility] for PEG tube feedings to improve her nutrition. ? ?Basil Dess ?04/30/2021, 6:42 PM Patient ID: Kristen [name], female   DOB: 10/06/1965, 56 y.o.   MRN: [id number] ? ?

## 2021-04-30 NOTE — Plan of Care (Signed)

## 2021-05-01 ENCOUNTER — Encounter: Payer: Self-pay | Admitting: Specialist

## 2021-05-01 ENCOUNTER — Encounter (HOSPITAL_COMMUNITY): Payer: Self-pay | Admitting: Specialist

## 2021-05-01 MED ORDER — OXYCODONE HCL 5 MG/5ML PO SOLN: 7.5000 mg | ORAL | 0 refills | Status: DC | PRN

## 2021-05-01 MED ORDER — METHOCARBAMOL 500 MG PO TABS: 500.0000 mg | ORAL_TABLET | Freq: Four times a day (QID) | ORAL | 1 refills | Status: DC | PRN

## 2021-05-01 MED FILL — Sodium Chloride IV Soln 0.9%: INTRAVENOUS | Qty: 1000 | Status: AC

## 2021-05-01 MED FILL — Heparin Sodium (Porcine) Inj 1000 Unit/ML: INTRAMUSCULAR | Qty: 30 | Status: AC

## 2021-05-01 NOTE — Progress Notes (Signed)
Occupational Therapy Treatment ?Patient Details ?Name: Kristen [name] ?MRN: [id number] ?DOB: [birth year] ?Today's Date: 05/01/2021 ? ? ?History of present illness 55yo s/p removal of hardware L3-S1; lumbar interbody fusion with instrumentation extended to thoracic 12 level, pedicle screws, rods, cage. PMH: previous back surgery, excision of part of tongue due to tongue/head and neck cancer s/p trach and peg (continues to use Peg for medicines), HTN, SOB. ?  ?OT comments ? Completed education regarding compensatory strategies for ADL and mobility, including the use of AE and DME while following back precautions. Handout reviewed. Pt verbalized understanding. Family will be able to assist as needed at DC. Goals met. No further OT needed.   ? ?Recommendations for follow up therapy are one component of a multi-disciplinary discharge planning process, led by the attending physician.  Recommendations may be updated based on patient status, additional functional criteria and insurance authorization. ?   ?Follow Up Recommendations ? No OT follow up  ?  ?Assistance Recommended at Discharge Intermittent Supervision/Assistance  ?Patient can return home with the following ? Assistance with cooking/housework;Assist for transportation ?  ?Equipment Recommendations ? BSC/3in1  ?  ?Recommendations for Other Services   ? ?  ?Precautions / Restrictions Precautions ?Precautions: Back ?Precaution Booklet Issued: Yes (comment) ?Required Braces or Orthoses: Spinal Brace ?Spinal Brace: Thoracolumbosacral orthotic;Applied in sitting position ?Restrictions ?Weight Bearing Restrictions: No  ? ? ?  ? ?Mobility Bed Mobility ?Overal bed mobility: Modified Independent ?  ?  ?  ?  ?  ?  ?  ?  ? ?Transfers ?Overall transfer level: Modified independent ?  ?  ?  ?  ?  ?  ?  ?  ?  ?  ?  ?Balance   ?  ?  ?  ?  ?  ?  ?  ?  ?  ?  ?  ?  ?  ?  ?  ?  ?  ?  ?   ? ?ADL either performed or assessed with clinical judgement  ? ?ADL   ?  ?  ?  ?  ?  ?  ?  ?  ?  ?   ?  ?  ?  ?  ?  ?  ?  ?  ?  ?General ADL Comments: Reviewed compensatory strategies; abel to comlee figure four position for LB ADL; reviewed [place] of reacher for IADL tasks and retrieving objects/use with ADL as needed; pt will have assistance fomr family if needed; plans to use her 3in1 as a BSC and shower chair with assistance from family' TLSO fits much better with side waite extension ?  ? ?Extremity/Trunk Assessment   ?  ?  ?  ?  ?  ? ?Vision   ?  ?  ?Perception   ?  ?Praxis   ?  ? ?Cognition Arousal/Alertness: Awake/alert ?Behavior During Therapy: [facility] for tasks assessed/performed ?Overall Cognitive Status: Within Functional Limits for tasks assessed ?  ?  ?  ?  ?  ?  ?  ?  ?  ?  ?  ?  ?  ?  ?  ?  ?  ?  ?  ?   ?Exercises   ? ?  ?Shoulder Instructions   ? ? ?  ?General Comments    ? ? ?Pertinent Vitals/ Pain       Pain Assessment ?Pain Assessment: Faces ?Faces Pain Scale: Hurts a little bit ?Pain Location: back ?Pain Descriptors / Indicators: Aching, Discomfort ?Pain Intervention(s): Limited activity within patient's  tolerance ? ?Home Living   ?  ?  ?  ?  ?  ?  ?  ?  ?  ?  ?  ?  ?  ?  ?  ?  ?  ?  ? ?  ?Prior Functioning/Environment    ?  ?  ?  ?   ? ?Frequency ?    ? ? ? ? ?  ?Progress Toward Goals ? ?OT Goals(current goals can now be found in the care plan section) ? Progress towards OT goals: Goals met/education completed, patient discharged from OT ? ?Acute Rehab OT Goals ?Patient Stated Goal: to go home this afternoon ?OT Goal Formulation: With patient ?Time For Goal Achievement: 05/13/21 ?Potential to Achieve Goals: Good ?ADL Goals ?Pt Will Perform Lower Body Bathing: with modified independence;sit to/from stand ?Pt Will Perform Lower Body Dressing: with modified independence;sit to/from stand ?Pt Will Transfer to Toilet: with modified independence;ambulating ?Pt Will Perform Toileting - Clothing Manipulation and hygiene: with modified independence ?Additional ADL Goal #1: independently verbalize 3/3 back  precautions  ?Plan All goals met and education completed, patient discharged from OT services   ? ?Co-evaluation ? ? ?   ?  ?  ?  ?  ? ?  ?AM-PAC OT "6 Clicks" Daily Activity     ?Outcome Measure ? ? Help from another person eating meals?: None ?Help from another person taking care of personal grooming?: None ?Help from another person toileting, which includes using toliet, bedpan, or urinal?: None ?Help from another person bathing (including washing, rinsing, drying)?: None ?Help from another person to put on and taking off regular upper body clothing?: None ?Help from another person to put on and taking off regular lower body clothing?: A Little ?6 Click Score: 23 ? ?  ?End of Session   ? ?  ?  ?Activity Tolerance Patient tolerated treatment well ?  ?Patient Left in bed;with call bell/phone within reach ?  ?Nurse Communication Mobility status ?  ? ?   ? ?Time: 2409-7353 ?OT Time Calculation (min): 12 min ? ?Charges: OT General Charges ?$OT Visit: 1 Visit ?OT Treatments ?$Self Care/Home Management : 8-22 mins ? ?[facility], OT/L  ? ?Acute OT Clinical Specialist ?Acute Rehabilitation Services ?Pager [phone] ?Office [phone]  ? ?Tylek Boney,HILLARY ?05/01/2021, 9:35 AM ?

## 2021-05-01 NOTE — Progress Notes (Signed)
Discharge instructions discussed with patient. All questions answered. Iv removed. Patient belongings returned.  ? ?Gwendolyn Grant, RN  ?

## 2021-05-01 NOTE — Care Management Important Message (Signed)
Important Message ? ?Patient Details  ?Name: Kristen [name] ?MRN: [id number] ?Date of Birth: Dec 02, 1965 ? ? ?Medicare Important Message Given:  Yes ? ? ? ? ?Micajah Dennin ?05/01/2021, 2:22 PM ?

## 2021-05-01 NOTE — Progress Notes (Signed)
? ? ? ?  Subjective: ?3 Days Post-Op Procedure(s) (LRB): ?REMOVAL OF RODS AND SCREWS LUMBAR THREE TO SACRAL ONE, LUMBAR THREE-FOUR LUMBAR INTERBODY FUSION,  INSTRUMENTATION EXTENDED TO THOACIC TWELVE LEVEL, PEDICLE SCREWS, RODS, CAGE, LOCAL BONE GRAFT, ALLOGRAFT, BMP (N/A) ? ?Patient reports pain as moderate.   ? ?Objective:  ? ?VITALS:  Temp:  [98.3 ?F (36.8 ?C)-99.2 ?F (37.3 ?C)] 98.3 ?F (36.8 ?C) (04/14 0805) ?Pulse Rate:  [62-103] 62 (04/14 0805) ?Resp:  [16-19] 18 (04/14 0805) ?BP: (87-116)/(62-84) 98/68 (04/14 0805) ?SpO2:  [94 %-100 %] 94 % (04/14 0805) ?Awake, alert and oriented x 4. On phone getting her home health in order. ? ?Neurologically intact ?ABD soft ?Neurovascular intact ?Sensation intact distally ?Intact pulses distally ?Dorsiflexion/Plantar flexion intact ?Incision: dressing C/D/I and no drainage ? ? ?LABS ?Recent Labs  ?  04/29/21 ?0438 04/30/21 ?0922  ?HGB 9.6* 9.4*  ?WBC 5.3 5.5  ?PLT 159 158  ? ?Recent Labs  ?  04/29/21 ?0438 04/30/21 ?0922  ?NA 139 139  ?K 4.0 3.9  ?CL 110 105  ?CO2 25 28  ?BUN 5* 10  ?CREATININE 0.54 0.64  ?GLUCOSE 123* 97  ? ?No results for input(s): LABPT, INR in the last 72 hours. ? ? ?Assessment/Plan: ?3 Days Post-Op Procedure(s) (LRB): ?REMOVAL OF RODS AND SCREWS LUMBAR THREE TO SACRAL ONE, LUMBAR THREE-FOUR LUMBAR INTERBODY FUSION,  INSTRUMENTATION EXTENDED TO THOACIC TWELVE LEVEL, PEDICLE SCREWS, RODS, CAGE, LOCAL BONE GRAFT, ALLOGRAFT, BMP (N/A) ?Low blood pressure probably due to anemia, lab values show no renal or electrolyte disturbance that would normally be seen with adrenal disfunction.  ? ?Advance diet ?Up with therapy ?D/C IV fluids ?Discharge home with home health ? ?Basil Dess ?05/01/2021, 8:48 AM  ?

## 2021-05-02 ENCOUNTER — Other Ambulatory Visit: Payer: Self-pay | Admitting: Specialist

## 2021-05-08 ENCOUNTER — Other Ambulatory Visit: Payer: Self-pay | Admitting: Family Medicine

## 2021-05-08 DIAGNOSIS — F411 Generalized anxiety disorder: Secondary | ICD-10-CM

## 2021-05-08 DIAGNOSIS — F321 Major depressive disorder, single episode, moderate: Secondary | ICD-10-CM

## 2021-05-09 DIAGNOSIS — Z1152 Encounter for screening for COVID-19: Secondary | ICD-10-CM | POA: Diagnosis not present

## 2021-05-13 ENCOUNTER — Other Ambulatory Visit: Payer: Self-pay

## 2021-05-13 ENCOUNTER — Encounter (HOSPITAL_COMMUNITY): Payer: Self-pay

## 2021-05-13 ENCOUNTER — Emergency Department (HOSPITAL_COMMUNITY)
Admission: EM | Admit: 2021-05-13 | Discharge: 2021-05-13 | Disposition: A | Discharge status: Home / Self Care | Payer: Medicare Other | Attending: Emergency Medicine | Admitting: Emergency Medicine

## 2021-05-13 ENCOUNTER — Encounter: Payer: Medicare Other | Admitting: Surgery

## 2021-05-13 ENCOUNTER — Telehealth: Payer: Self-pay | Admitting: Family Medicine

## 2021-05-13 DIAGNOSIS — Z4659 Encounter for fitting and adjustment of other gastrointestinal appliance and device: Secondary | ICD-10-CM

## 2021-05-13 DIAGNOSIS — Z7982 Long term (current) use of aspirin: Secondary | ICD-10-CM | POA: Insufficient documentation

## 2021-05-13 DIAGNOSIS — T85598A Other mechanical complication of other gastrointestinal prosthetic devices, implants and grafts, initial encounter: Secondary | ICD-10-CM | POA: Insufficient documentation

## 2021-05-13 DIAGNOSIS — I10 Essential (primary) hypertension: Secondary | ICD-10-CM | POA: Insufficient documentation

## 2021-05-13 DIAGNOSIS — K9423 Gastrostomy malfunction: Secondary | ICD-10-CM | POA: Diagnosis not present

## 2021-05-13 NOTE — Telephone Encounter (Signed)
Pt requesting a prescription for medication for her suspected UTI, has had symptoms for 5 days. offered to schedule appointment, patient declined ?

## 2021-05-13 NOTE — Telephone Encounter (Signed)
Spoke with pt, informed her she would have to come in to give a urine specimen before any Rx could be sent. Offered appt, stated she will call back after speaking to her daughter.  ?

## 2021-05-13 NOTE — Discharge Instructions (Addendum)
Follow up with your GI specialist as planned.   ? ?Return to the ED for new or worsening symptoms as discussed.   ?

## 2021-05-13 NOTE — ED Provider Notes (Signed)
?[facility] DEPT ?Provider Note ? ? ?CSN: [id number] ?Arrival date & time: 05/13/21  1837 ? ?  ? ?History ? ?Chief Complaint  ?Patient presents with  ? Feeding tube dislodged  ? ? ?Kristen [name] is a 56 y.o. female with chief complaint of accidental feeding tube removal.  Has had a feeding tube in general for the last 5-6 years.  Normally gets replaced every 4-6 months.  This most recent tube has been in place for about 5 months.  Stated she was adjusting her pants when the tube got caught and was pulled out around 1800 today.  Normally has size 18 feeding tube.  Denies recent fever, N/V, or abdominal pain.  No pain or redness around the site.  Per GI documentation from 11/2020 "tolerates solid food p.o. and uses her PEG for her medications".  Pt states PEG also being used for nutrition now as well.   ? ?The history is provided by the patient and medical records.  ? ?  ? ?Home Medications ?Prior to Admission medications   ?Medication Sig Start Date End Date Taking? Authorizing Provider  ?aspirin 81 MG chewable tablet Place 81 mg into feeding tube daily.  10/05/16   [provider]  ?baclofen (LIORESAL) 10 MG tablet Take 1 tablet (10 mg total) by mouth 3 (three) times daily. 12/31/20   Jessy Oto, MD  ?Calcium Citrate 200 MG TABS Place 200 mg into feeding tube daily at 12 noon.    [provider]  ?Cholecalciferol (VITAMIN D3) 10 MCG (400 UNIT) tablet Take 400 Units by mouth daily.    [provider]  ?clotrimazole (MYCELEX) 10 MG troche Take 1 tablet (10 mg total) by mouth 3 (three) times daily as needed (infection). 10/28/20   Gardenia Phlegm, NP  ?DEXILANT 60 MG capsule Place 1 capsule (60 mg total) into feeding tube daily. 07/26/20   Patrecia Pour, MD  ?diphenoxylate-atropine (LOMOTIL) 2.5-0.025 MG tablet PLACE 2 TABLETS INTO FEEDING TUBE TWICE DAILY 01/07/21   Gatha Mayer, MD  ?ferrous sulfate 220 (44 Fe) MG/5ML solution PLACE  6.8MLS(300MG) INTO FEEDING TUBE DAILY 11/28/20   Gatha Mayer, MD  ?folic acid (FOLVITE) 881 MCG tablet Place 400 mcg into feeding tube daily.    [provider]  ?lidocaine (LMX) 4 % cream Apply 1 application. topically as needed (pain).    [provider]  ?methocarbamol (ROBAXIN) 500 MG tablet Place 1 tablet (500 mg total) into feeding tube every 6 (six) hours as needed for muscle spasms. 05/01/21   Jessy Oto, MD  ?naproxen (NAPROSYN) 500 MG tablet TAKE 1 TABLET(500 MG) BY MOUTH TWICE DAILY WITH A MEAL 05/04/21   Jessy Oto, MD  ?oxyCODONE (ROXICODONE) 5 MG/5ML solution Place 7.5 mLs (7.5 mg total) into feeding tube every 4 (four) hours as needed for moderate pain or severe pain ((score 4-6)). 05/01/21   Jessy Oto, MD  ?potassium chloride SA (KLOR-CON) 20 MEQ tablet TAKE 1 TABLET BY MOUTH TWICE DAILY FOR THE NEXT 3 DAYS. OKAY TO CRUSH IF UNABLE TO SWALLOW. CAN ALSO TAKE PER TUBE IF NEEDED. ?Patient taking differently: 20 mEq at bedtime. OKAY TO CRUSH IF UNABLE TO SWALLOW. CAN ALSO TAKE PER TUBE IF NEEDED. 08/13/19   [name], MD  ?pregabalin (LYRICA) 75 MG capsule TAKE 1 CAPSULE(75 MG) BY MOUTH TWICE DAILY 05/04/21   Jessy Oto, MD  ?sertraline (ZOLOFT) 25 MG tablet TAKE 1 TABLET(25 MG) BY MOUTH DAILY  05/12/21   [name], MD  ?spironolactone (ALDACTONE) 25 MG tablet Take 25 mg by mouth 2 (two) times daily. 11/19/20   [provider]  ?   ? ?Allergies    ?Other and Sulfa antibiotics   ? ?Review of Systems   ?Review of Systems  ?Gastrointestinal:   ?     Feeding tube dislodgement   ? ?Physical Exam ?Updated Vital Signs ?BP 109/82   Pulse 74   Temp 98.4 ?F (36.9 ?C) (Oral)   Resp 16   LMP 11/15/2013   SpO2 100%  ?Physical Exam ?Vitals and nursing note reviewed.  ?Constitutional:   ?   General: She is not in acute distress. ?   Appearance: She is well-developed.  ?HENT:  ?   Head: Normocephalic and atraumatic.  ?   Mouth/Throat:  ?   Comments:  Adentulous ?Eyes:  ?   Conjunctiva/sclera: Conjunctivae normal.  ?Cardiovascular:  ?   Rate and Rhythm: Normal rate and regular rhythm.  ?   Heart sounds: No murmur heard. ?Pulmonary:  ?   Effort: Pulmonary effort is normal. No respiratory distress.  ?   Breath sounds: Normal breath sounds.  ?Abdominal:  ?   General: Bowel sounds are normal.  ?   Palpations: Abdomen is soft.  ?   Tenderness: There is no abdominal tenderness.  ?   Comments: Stoma on the left central abdomen exposed without evidence of infection, skin irritation, or excess secretions.  PEG tube absent/not attached.  ?Musculoskeletal:     ?   General: No swelling.  ?   Cervical back: Neck supple.  ?Skin: ?   General: Skin is warm and dry.  ?   Capillary Refill: Capillary refill takes less than 2 seconds.  ?Neurological:  ?   Mental Status: She is alert and oriented to person, place, and time.  ?Psychiatric:     ?   Mood and Affect: Mood normal.  ? ? ?ED Results / Procedures / Treatments   ?Labs ?(all labs ordered are listed, but only abnormal results are displayed) ?Labs Reviewed - No data to display ? ?EKG ?None ? ?Radiology ?No results found. ? ?Procedures ?Gastrostomy tube replacement ? ?Date/Time: 05/13/2021 9:46 PM ?Performed by: Prince Rome, PA-C ?Authorized by: Prince Rome, PA-C  ?Consent: Verbal consent obtained. Written consent obtained. ?Risks and benefits: risks, benefits and alternatives were discussed ?Consent given by: patient ?Patient understanding: patient states understanding of the procedure being performed ?Patient consent: the patient's understanding of the procedure matches consent given ?Patient identity confirmed: verbally with patient and arm band ?Preparation: Patient was prepped and draped in the usual sterile fashion. ?Local anesthesia used: no ? ?Anesthesia: ?Local anesthesia used: no ? ?Sedation: ?Patient sedated: no ? ?Patient tolerance: patient tolerated the procedure well with no immediate  complications ?Comments: Size 18 french catheter used; balloon tested prior to insertion; gauze padding placed around insertion site and secured effectively ? ?  ? ? ?Medications Ordered in ED ?Medications - No data to display ? ?ED Course/ Medical Decision Making/ A&P ?  ?                        ?Medical Decision Making ?Amount and/or Complexity of Data Reviewed ?External Data Reviewed: notes. ?Labs:  Decision-making details documented in ED Course. ?Radiology:  Decision-making details documented in ED Course. ?ECG/medicine tests:  Decision-making details documented in ED Course. ? ?Risk ?OTC drugs. ?Prescription drug management. ? ? ?56 y.o.  female presents to the ED for concern of Feeding tube dislodged.  This involves an extensive number of treatment options, and is a complaint that carries with it a high risk of complications and morbidity.   ? ?Past Medical History / Co-morbidities / Social History: ?GERD, thrombocytopenia, chronic diarrhea, prior oral malignancy, IDA, alcohol use, HTN, recent spinal surgery ?Social Determinants of Health include alcohol use for which cessation counseling provided ? ?Additional History:  ?Internal and external records from outside source obtained and reviewed including GI, family medicine, orthopedics ? ?Physical Exam: ?Physical exam performed. The pertinent findings include: stoma of the abdomen appreciated without infection.  PEG tube absent.  Lungs CTAB without increased respiratory effort.  Speaks clearly.  Adentulous. ? ?Lab Tests: ?None ? ?Imaging Studies: ?I considered ordering x-rays of the abdomen to confirm placement, but patient arrived within a few hours of removal, french catheter appeared to slide in correctly and without issue, stoma without evidence of infection or obstruction or constriction, and placement feels correct via patient, myself, and my attending, Dr. Francia Greaves.  Not suspicious of incorrect placement.  Therefore I believe this may be deferred at this  time. ? ?Medications: ?None ? ?Procedures: ?Patient underwent PEG tube replacement, described above in further detail.  Reevaluation of patient after this intervention showed that the patient tolerated this well without issue. ? ?ED Course/Dispo

## 2021-05-13 NOTE — ED Triage Notes (Signed)
Pt states that her feeding tube came out 3 hrs ago.  ?

## 2021-05-14 ENCOUNTER — Ambulatory Visit: Payer: Medicare Other | Admitting: Family Medicine

## 2021-05-14 ENCOUNTER — Other Ambulatory Visit: Payer: Self-pay

## 2021-05-14 ENCOUNTER — Encounter (HOSPITAL_COMMUNITY): Payer: Self-pay

## 2021-05-14 ENCOUNTER — Ambulatory Visit: Payer: Medicare Other

## 2021-05-14 ENCOUNTER — Ambulatory Visit (INDEPENDENT_AMBULATORY_CARE_PROVIDER_SITE_OTHER): Payer: Medicare Other | Admitting: Surgery

## 2021-05-14 ENCOUNTER — Encounter: Payer: Self-pay | Admitting: Surgery

## 2021-05-14 ENCOUNTER — Emergency Department (HOSPITAL_COMMUNITY)
Admission: EM | Admit: 2021-05-14 | Discharge: 2021-05-14 | Disposition: A | Discharge status: Home / Self Care | Payer: Medicare Other | Attending: Emergency Medicine | Admitting: Emergency Medicine

## 2021-05-14 ENCOUNTER — Encounter: Payer: Self-pay | Admitting: Family Medicine

## 2021-05-14 VITALS — BP 101/63 | HR 74 | Ht 67.0 in | Wt 151.4 lb

## 2021-05-14 DIAGNOSIS — T85518A Breakdown (mechanical) of other gastrointestinal prosthetic devices, implants and grafts, initial encounter: Secondary | ICD-10-CM | POA: Diagnosis not present

## 2021-05-14 DIAGNOSIS — Z7982 Long term (current) use of aspirin: Secondary | ICD-10-CM | POA: Insufficient documentation

## 2021-05-14 DIAGNOSIS — K9420 Gastrostomy complication, unspecified: Secondary | ICD-10-CM

## 2021-05-14 DIAGNOSIS — M4316 Spondylolisthesis, lumbar region: Secondary | ICD-10-CM

## 2021-05-14 DIAGNOSIS — K9423 Gastrostomy malfunction: Secondary | ICD-10-CM | POA: Diagnosis not present

## 2021-05-14 DIAGNOSIS — Z79899 Other long term (current) drug therapy: Secondary | ICD-10-CM | POA: Insufficient documentation

## 2021-05-14 NOTE — Discharge Instructions (Signed)
Follow-up with your doctor as needed for problems.  Try to keep the tube covered at all times so it does not accidentally get dislodged. ?

## 2021-05-14 NOTE — ED Triage Notes (Signed)
Pt states that her feeding tube came out 40 mins ago.  ?

## 2021-05-14 NOTE — ED Provider Notes (Signed)
?[facility] DEPT ?Provider Note ? ? ?CSN: [id number] ?Arrival date & time: 05/14/21  1908 ? ?  ? ?History ? ?Chief Complaint  ?Patient presents with  ? Feeding tube came out  ? ? ?Kristen [name] is a 56 y.o. female. ? ?HPI ?She presents for evaluation of dislodged feeding tube, last replaced yesterday in the ED.  She is not sure why it came out.  She denies abdominal pain, vomiting, weakness or dizziness ?  ? ?Home Medications ?Prior to Admission medications   ?Medication Sig Start Date End Date Taking? Authorizing Provider  ?aspirin 81 MG chewable tablet Place 81 mg into feeding tube daily.  10/05/16   [provider]  ?baclofen (LIORESAL) 10 MG tablet Take 1 tablet (10 mg total) by mouth 3 (three) times daily. 12/31/20   Jessy Oto, MD  ?Calcium Citrate 200 MG TABS Place 200 mg into feeding tube daily at 12 noon.    [provider]  ?Cholecalciferol (VITAMIN D3) 10 MCG (400 UNIT) tablet Take 400 Units by mouth daily.    [provider]  ?clotrimazole (MYCELEX) 10 MG troche Take 1 tablet (10 mg total) by mouth 3 (three) times daily as needed (infection). 10/28/20   Gardenia Phlegm, NP  ?DEXILANT 60 MG capsule Place 1 capsule (60 mg total) into feeding tube daily. 07/26/20   Patrecia Pour, MD  ?diphenoxylate-atropine (LOMOTIL) 2.5-0.025 MG tablet PLACE 2 TABLETS INTO FEEDING TUBE TWICE DAILY 01/07/21   Gatha Mayer, MD  ?ferrous sulfate 220 (44 Fe) MG/5ML solution PLACE 6.8MLS(300MG) INTO FEEDING TUBE DAILY 11/28/20   Gatha Mayer, MD  ?folic acid (FOLVITE) 542 MCG tablet Place 400 mcg into feeding tube daily.    [provider]  ?lidocaine (LMX) 4 % cream Apply 1 application. topically as needed (pain).    [provider]  ?methocarbamol (ROBAXIN) 500 MG tablet Place 1 tablet (500 mg total) into feeding tube every 6 (six) hours as needed for muscle spasms. 05/01/21   Jessy Oto, MD  ?naproxen (NAPROSYN) 500 MG tablet  TAKE 1 TABLET(500 MG) BY MOUTH TWICE DAILY WITH A MEAL 05/04/21   Jessy Oto, MD  ?oxyCODONE (ROXICODONE) 5 MG/5ML solution Place 7.5 mLs (7.5 mg total) into feeding tube every 4 (four) hours as needed for moderate pain or severe pain ((score 4-6)). 05/01/21   Jessy Oto, MD  ?potassium chloride SA (KLOR-CON) 20 MEQ tablet TAKE 1 TABLET BY MOUTH TWICE DAILY FOR THE NEXT 3 DAYS. OKAY TO CRUSH IF UNABLE TO SWALLOW. CAN ALSO TAKE PER TUBE IF NEEDED. ?Patient taking differently: 20 mEq at bedtime. OKAY TO CRUSH IF UNABLE TO SWALLOW. CAN ALSO TAKE PER TUBE IF NEEDED. 08/13/19   [name], MD  ?pregabalin (LYRICA) 75 MG capsule TAKE 1 CAPSULE(75 MG) BY MOUTH TWICE DAILY 05/04/21   Jessy Oto, MD  ?sertraline (ZOLOFT) 25 MG tablet TAKE 1 TABLET(25 MG) BY MOUTH DAILY 05/12/21   [name], MD  ?spironolactone (ALDACTONE) 25 MG tablet Take 25 mg by mouth 2 (two) times daily. 11/19/20   [provider]  ?   ? ?Allergies    ?Other and Sulfa antibiotics   ? ?Review of Systems   ?Review of Systems ? ?Physical Exam ?Updated Vital Signs ?BP 104/70   Pulse 77   Temp 98.2 ?F (36.8 ?C) (Oral)   Resp 17   LMP 11/15/2013   SpO2 98%  ?Physical Exam ?Vitals and nursing note reviewed.  ?  Constitutional:   ?   General: She is not in acute distress. ?   Appearance: She is well-developed. She is not ill-appearing or diaphoretic.  ?HENT:  ?   Head: Normocephalic and atraumatic.  ?   Right Ear: External ear normal.  ?   Left Ear: External ear normal.  ?Eyes:  ?   Conjunctiva/sclera: Conjunctivae normal.  ?   Pupils: Pupils are equal, round, and reactive to light.  ?Neck:  ?   Trachea: Phonation normal.  ?Cardiovascular:  ?   Rate and Rhythm: Normal rate.  ?Pulmonary:  ?   Effort: Pulmonary effort is normal.  ?Abdominal:  ?   General: There is no distension.  ?   Palpations: Abdomen is soft.  ?   Tenderness: There is no abdominal tenderness.  ?   Comments: Ostomy left upper quadrant, consistent with gastrostomy  site.  ?Musculoskeletal:     ?   General: Normal range of motion.  ?   Cervical back: Normal range of motion.  ?Skin: ?   General: Skin is warm and dry.  ?Neurological:  ?   Mental Status: She is alert and oriented to person, place, and time.  ?   Cranial Nerves: No cranial nerve deficit.  ?   Sensory: No sensory deficit.  ?   Motor: No abnormal muscle tone.  ?   Coordination: Coordination normal.  ?Psychiatric:     ?   Mood and Affect: Mood normal.     ?   Behavior: Behavior normal.     ?   Thought Content: Thought content normal.     ?   Judgment: Judgment normal.  ? ? ?ED Results / Procedures / Treatments   ?Labs ?(all labs ordered are listed, but only abnormal results are displayed) ?Labs Reviewed - No data to display ? ?EKG ?None ? ?Radiology ?No results found. ? ?Procedures ?FEEDING TUBE REPLACEMENT ? ?Date/Time: 05/14/2021 8:04 PM ?Performed by: Daleen Bo, MD ?Authorized by: Daleen Bo, MD  ?Consent: Verbal consent obtained. ?Indications: tube dislodged ?Local anesthesia used: no ? ?Anesthesia: ?Local anesthesia used: no ? ?Sedation: ?Patient sedated: no ? ?Tube type: gastrostomy ?Patient position: supine ?Tube size: 18 Fr ?Endoscope used: no ?Bulb inflation volume: 9 (ml) ?Bulb inflation fluid: normal saline ?Tube placement difficulty: none ?Patient tolerance: patient tolerated the procedure well with no immediate complications ? ?  ? ? ?Medications Ordered in ED ?Medications - No data to display ? ?ED Course/ Medical Decision Making/ A&P ?  ?                        ?Medical Decision Making ?Patient's feeding tube became dislodged.  She requires another 1 for chronic feeding ? ?Problems Addressed: ?Complication of feeding tube [facility]): acute illness or injury ?   Details: Recurrent ? ?Amount and/or Complexity of Data Reviewed ?Independent Historian:  ?   Details: She is a cogent historian ? ?Risk ?Decision regarding hospitalization. ?Risk Details: Patient has a feeding tube dislodgment today, it was  just replaced yesterday.  She is nontoxic but needs a feeding tube placement.  Tube replaced without complication.  Patient discharged.  She does not require hospitalization. ? ? ? ? ? ? ? ? ? ? ?Final Clinical Impression(s) / ED Diagnoses ?Final diagnoses:  ?Complication of feeding tube ([place])  ? ? ?Rx / DC Orders ?ED Discharge Orders   ? ? None  ? ?  ? ? ?  ?Daleen Bo, MD ?05/14/21 2019 ? ?

## 2021-05-15 DIAGNOSIS — Z1152 Encounter for screening for COVID-19: Secondary | ICD-10-CM | POA: Diagnosis not present

## 2021-05-21 ENCOUNTER — Encounter: Payer: Self-pay | Admitting: Surgery

## 2021-05-21 ENCOUNTER — Ambulatory Visit (INDEPENDENT_AMBULATORY_CARE_PROVIDER_SITE_OTHER): Payer: Medicare Other | Admitting: Surgery

## 2021-05-21 VITALS — BP 96/60 | HR 79

## 2021-05-21 DIAGNOSIS — Z981 Arthrodesis status: Secondary | ICD-10-CM

## 2021-05-21 DIAGNOSIS — Z1152 Encounter for screening for COVID-19: Secondary | ICD-10-CM | POA: Diagnosis not present

## 2021-05-21 NOTE — Progress Notes (Signed)
56 year old female who is status post lumbar fusion returns for wound check and staple removal.  States that she continues to do well.  No issues. ? ?Exam ?Pleasant female alert and oriented in no acute distress.  Wound looks good.  Remaining staples removed and Steri-Strips applied.  No drainage or signs infection. ? ?Plan ?Follow-up with Dr. Louanne Skye in 4 weeks for recheck and likely repeat x-rays at that time.  Continue brace. ?

## 2021-05-21 NOTE — Therapy (Signed)
[facility] ?[facility] ?[address], [address] ?[place], [place], [address] ?Phone: [phone]   Fax:  [phone] ? ?Patient Details  ?Name: Kristen [name] ?MRN: [id number] ?Date of Birth: 1966/01/04 ?Referring Provider:  Denman George., MD ?Encounter Date: 05/21/2021 ? ?SPEECH THERAPY DISCHARGE SUMMARY ? ?Visits from Start of Care: 6 ? ?Current functional level related to goals / functional outcomes: ?Pt no longer showed to therapy after her visit on 07-11-20. Goals and plan from that visit are below: ? ?SLP Short Term Goals - 07/11/20 1156   ?  ?    ?     ?  SLP SHORT TERM GOAL #1  ?  Title pt will complete HEP with rare min A over two sessions   ?  Baseline 06/23/20;   ?  Status Achieved   ?     ?  SLP SHORT TERM GOAL #2  ?  Title Pt will keep a food journal over 2 weeks with  rare min A to promote improved balanced diet and advanced consistency.   ?  Time 2   ?  Period Weeks   ?  Status On-going   ?     ?  SLP SHORT TERM GOAL #3  ?  Title Pt will add 2 new high protien/fat foods to her diet rotation over 3 weeks   ?  Time 2   ?  Period Weeks   ?  Status On-going   ?     ?  SLP SHORT TERM GOAL #4  ?  Title Pt will utilize 3 compensatory strategies (internal and environmental)  for dysarthira 18/20 sentence responses in structured task   ?  Status Achieved   ?     ?  SLP SHORT TERM GOAL #5  ?  Title Pt will complete pt reported outcome survey for dysarthria   ?  Time 1   ?  Period Weeks   ?  Status On-going   ?  ?   ?  ?  ?   ?  ?  ?  SLP Long Term Goals - 07/11/20 1156   ?  ?    ?     ?  SLP LONG TERM GOAL #1  ?  Title pt will complete HEP with modified independence over three sessions    ?  Time 6   ?  Period Weeks   ?  Status On-going   ?     ?  SLP LONG TERM GOAL #2  ?  Title Pt will add 4 new foods (protien/healthy fat) to her diet rotation over 6 weeks   ?  Time 6   ?  Period Weeks   ?  Status On-going   ?     ?  SLP LONG TERM GOAL #3  ?  Title Pt will maintain food journal over 5  weeks to promote improved variety of diet and advance consisteny   ?  Time 6   ?  Period Weeks   ?  Status On-going   ?     ?  SLP LONG TERM GOAL #4  ?  Title Pt will improve score of communciation PRO by 2 points   ?  Time 6   ?  Period Weeks   ?  Status On-going   ?     ?  SLP LONG TERM GOAL #5  ?  Title Pt will carryover strategies for dysarthriaand vocal efficiency to be intelligilble in 15  minute conversation in mildly noisy environment   ?  Time 6   ?  Period Weeks   ?  Status On-going   ?  ?   ?  ?  ?   ?  ?  ?  Plan - 07/11/20 1155   ?  ?  Clinical Impression Statement Kristen [name] continues to use food diary to eat 3x a day and adding protien more regularly. She is carring over compensations for dysarthtria with family and reports improve intellgibility and reduced frustration. Ongoing training for HEP for dysarthria and dysphagia. Attendance continues inconsistent as she has no showed and cancelled 4 sessions - today she was almost 20 minutes late to this appointment.  Continue skilled ST to maximize safety of swallow, nutrtion/hydration and intelligiblity for successful particiation at home and in community.   ?  Speech Therapy Frequency 2x / week   ?  Duration 8 weeks   17 visits  ?  ? ?  ?Remaining deficits: ?Assumed that deficits remain. ?  ?Education / Equipment: ?Compensations, HEP  ? ?Patient agrees to discharge. Patient goals were partially met. Patient is being discharged due to not returning since the last visit.. ? ? ? ? ? ?Tai Syfert, CCC-SLP ?05/21/2021, 12:21 PM ? ?[place] ?[facility] ?[address], [address] ?[place], [place], [address] ?Phone: [phone]   Fax:  [phone] ?

## 2021-05-22 ENCOUNTER — Ambulatory Visit (INDEPENDENT_AMBULATORY_CARE_PROVIDER_SITE_OTHER): Payer: Medicare Other | Admitting: Orthopedic Surgery

## 2021-05-22 DIAGNOSIS — M1812 Unilateral primary osteoarthritis of first carpometacarpal joint, left hand: Secondary | ICD-10-CM | POA: Diagnosis not present

## 2021-05-22 NOTE — Progress Notes (Signed)
? ?Office Visit Note ?  ?Patient: Kristen [name]           ?Date of Birth: 10/02/1965           ?MRN: [id number] ?Visit Date: 05/22/2021 ?             ?Requested by: [name], MD ?[address] ?[place],  [place] [address] ?PCP: [name], MD ? ? ?Assessment & Plan: ?Visit Diagnoses:  ?1. Arthritis of carpometacarpal (CMC) joint of left thumb   ? ? ?Plan: Patient is now 4 months status post left thumb LRTI.  She is doing very well.  She has no pain today and can do all of her daily activities without issue..  She is overall pleased with her progress and happy that she had the surgery. ? ?Follow-Up Instructions: No follow-ups on file.  ? ?Orders:  ?No orders of the defined types were placed in this encounter. ? ?No orders of the defined types were placed in this encounter. ? ? ? ? Procedures: ?No procedures performed ? ? ?Clinical Data: ?No additional findings. ? ? ?Subjective: ?Chief Complaint  ?Patient presents with  ? Left Thumb - Follow-up  ? ? ?This is a 56 year old female now 75-monthstatus post left thumb LRTI for CMC arthritis.  She is doing very well.  She has no complaints today.  She is able to do all of her normal activities without any issues.  She is overall pleased with her progress and happy that she had the surgery. ? ? ?Review of Systems ? ? ?Objective: ?Vital Signs: LMP 11/15/2013  ? ?Physical Exam ?Constitutional:   ?   Appearance: Normal appearance.  ?Cardiovascular:  ?   Rate and Rhythm: Normal rate.  ?   Pulses: Normal pulses.  ?Pulmonary:  ?   Effort: Pulmonary effort is normal.  ?Skin: ?   General: Skin is warm and dry.  ?   Capillary Refill: Capillary refill takes less than 2 seconds.  ?Neurological:  ?   Mental Status: She is alert.  ? ? ?Left Hand Exam  ? ?Tenderness  ?The patient is experiencing no tenderness.  ? ?Range of Motion  ?The patient has normal left wrist ROM. ? ?Other  ?Erythema: absent ?Sensation: normal ?Pulse: present ? ?Comments:  No pain w/ CMC grind  test.  No MP hyper-extension.  Near baseline ROM of thumb.  ? ? ? ? ?Specialty Comments:  ?No specialty comments available. ? ?Imaging: ?No results found. ? ? ?PMFS History: ?Patient Active Problem List  ? Diagnosis Date Noted  ? Protein-calorie malnutrition, severe 04/29/2021  ? Pseudoarthrosis of lumbar spine 04/28/2021  ? Fusion of spine of lumbar region 04/28/2021  ? Pseudarthrosis after fusion or arthrodesis 04/20/2021  ?  Class: Chronic  ? Tenosynovitis, de Quervain 12/14/2020  ? Arthritis of carpometacarpal ([facility] joint of left thumb 12/14/2020  ? Aortic atherosclerosis ([facility] 08/20/2020  ? Hyperkalemia 07/24/2020  ? Alcohol use 08/05/2019  ? Bilateral lower extremity edema 08/05/2019  ? Peripheral edema 03/12/2019  ? Iron deficiency anemia 12/11/2018  ? Loosening of hardware in spine ([facility]   ? Other spondylosis with radiculopathy, lumbar region   ? History of lumbar spinal fusion 11/06/2018  ? UTI (urinary tract infection) 05/15/2017  ? 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 Malignant neoplasm of base of tongue ([facility] 10/29/2016  ? 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[facility] 10/29/2016  ? Headache associated with sexual activity 05/14/2016  ? Tongue  lesion 05/14/2016  ? Thrombocytopenia ([place]) 01/22/2016  ? Chronic diarrhea 01/22/2016  ? Chest pain   ? Essential hypertension   ? Spinal stenosis of lumbar region 01/20/2016  ?  Class: Chronic  ? DDD (degenerative disc disease), lumbar 01/20/2016  ?  Class: Chronic  ? Spinal stenosis of lumbar region with neurogenic claudication 01/20/2016  ? Low back pain 12/22/2015  ? Hypersomnolence 10/13/2015  ? Muscle cramp 08/01/2015  ? GERD (gastroesophageal reflux disease) 07/09/2015  ? Morbid obesity with BMI of 40.0-44.9, adult ([place]) 01/08/2013  ? ?Past Medical History:  ?Diagnosis Date  ? Allergy   ? Anemia   ? Iron deficiency  ? Anxiety   ? Arthritis   ? back, left shoulder  ? Blood transfusion without reported diagnosis   ? Chicken pox   ?  Depression   ? Diarrhea   ? takes Imodium daily  ? GERD (gastroesophageal reflux disease)   ? H/O hiatal hernia   ? Headache(784.0)   ? History of kidney stones   ? 1996ish  ? History of radiation therapy 11/16/16- 01/01/17  ? Base of Tongue/ 66 gy in 33 fractions/ Dose: 2 Gy  ? Hypertension   ? Iron deficiency anemia 12/11/2018  ? Migraine   ? none for 5 years (as of 01/13/16)  ? OSA (obstructive sleep apnea) 10/13/2015  ? unable to get cpap, plans to get one in 2018  ? Pneumonia   ? Restless legs   ? Scoliosis   ? Shingles 08/28/2013  ? Shortness of breath   ? with exertion  ? Sleep apnea   ? Tongue cancer ([place])   ? tongue cancer  ?  ?Family History  ?Problem Relation Age of Onset  ? Heart disease Father   ? Heart attack Father   ? Hypertension Mother   ? Arthritis Mother   ? Diabetes Maternal Grandmother   ? Diabetes Paternal Grandmother   ? Diabetes Maternal Uncle   ? Colon polyps Neg Hx   ? Colon cancer Neg Hx   ? Esophageal cancer Neg Hx   ? Stomach cancer Neg Hx   ? Rectal cancer Neg Hx   ?  ?Past Surgical History:  ?Procedure Laterality Date  ? BACK SURGERY  07/21/1978  ? CARPOMETACARPEL SUSPENSION PLASTY Left 01/21/2021  ? Procedure: Left Thumb Ligament Carpometacarpal Arthroplasty;  Surgeon: Sherilyn Cooter, MD;  Location: [facility];  Service: Orthopedics;  Laterality: Left;  ? CHOLECYSTECTOMY N/A 08/15/2013  ? Procedure: LAPAROSCOPIC CHOLECYSTECTOMY WITH INTRAOPERATIVE CHOLANGIOGRAM;  Surgeon: Gwenyth Ober, MD;  Location: [facility];  Service: General;  Laterality: N/A;  ? COLONOSCOPY N/A 01/09/2013  ? Procedure: COLONOSCOPY;  Surgeon: Beryle Beams, MD;  Location: [facility];  Service: Endoscopy;  Laterality: N/A;  ? COLONOSCOPY  03/2018  ? DIRECT LARYNGOSCOPY  07/2016  ? Dr. Nicolette Bang [facility]  ? ESOPHAGOGASTRODUODENOSCOPY  03/2018  ? ESOPHAGOGASTRODUODENOSCOPY (EGD) WITH PROPOFOL N/A 04/24/2019  ? Procedure: ESOPHAGOGASTRODUODENOSCOPY (EGD) WITH PROPOFOL;  Surgeon: Gatha Mayer, MD;  Location: [facility];   Service: Endoscopy;  Laterality: N/A;  ? GASTROSTOMY TUBE PLACEMENT  09/27/2016  ? GIVENS CAPSULE STUDY N/A 04/24/2019  ? Procedure: GIVENS CAPSULE STUDY;  Surgeon: Gatha Mayer, MD;  Location: [facility];  Service: Endoscopy;  Laterality: N/A;  ? HERNIA REPAIR Left 1981  ? IR PATIENT EVAL TECH 0-60 MINS  12/13/2016  ? IR PATIENT EVAL TECH 0-60 MINS  04/11/2017  ? IR PATIENT EVAL TECH 0-60 MINS  03/24/2018  ? IR PATIENT EVAL  TECH 0-60 MINS  11/23/2018  ? IR PATIENT EVAL TECH 0-60 MINS  05/28/2019  ? IR REPLACE G-TUBE SIMPLE WO FLUORO  12/12/2017  ? IR REPLACE G-TUBE SIMPLE WO FLUORO  03/29/2018  ? IR REPLACE G-TUBE SIMPLE WO FLUORO  06/29/2018  ? IR REPLACE G-TUBE SIMPLE WO FLUORO  11/02/2018  ? IR REPLACE G-TUBE SIMPLE WO FLUORO  03/08/2019  ? IR REPLACE G-TUBE SIMPLE WO FLUORO  07/02/2019  ? IR REPLACE G-TUBE SIMPLE WO FLUORO  11/30/2019  ? IR REPLACE G-TUBE SIMPLE WO FLUORO  12/27/2019  ? IR REPLACE G-TUBE SIMPLE WO FLUORO  05/27/2020  ? IR REPLACE G-TUBE SIMPLE WO FLUORO  07/09/2020  ? MODIFIED RADICAL NECK DISSECTION Left 09/27/2016  ? Levels 1 & 2  ? PARTIAL GLOSSECTOMY Left 09/27/2016  ? Left hemi partial glossectomy  ? SPINE SURGERY  01/20/2016  ? fusion  ? TONSILLECTOMY    ? tracheotomy  09/27/2016  ? TUBAL LIGATION  06/1988  ? ?Social History  ? ?Occupational History  ? Occupation: Air cabin crew  ?Tobacco Use  ? Smoking status: Never  ? Smokeless tobacco: Never  ?Vaping Use  ? Vaping Use: Never used  ?Substance and Sexual Activity  ? Alcohol use: Yes  ?  Alcohol/week: 7.0 standard drinks  ?  Types: 7 Cans of beer per week  ?  Comment: occasionally  ? Drug use: No  ? Sexual activity: Yes  ?  Birth control/protection: Surgical  ? ? ? ? ? ? ?

## 2021-05-27 ENCOUNTER — Other Ambulatory Visit: Payer: Self-pay | Admitting: Specialist

## 2021-05-28 ENCOUNTER — Other Ambulatory Visit: Payer: Self-pay | Admitting: Specialist

## 2021-05-28 ENCOUNTER — Ambulatory Visit
Admission: RE | Admit: 2021-05-28 | Discharge: 2021-05-28 | Disposition: A | Discharge status: Home / Self Care | Payer: Medicare Other | Source: Ambulatory Visit | Attending: Specialist | Admitting: Specialist

## 2021-05-28 ENCOUNTER — Other Ambulatory Visit: Payer: Self-pay | Admitting: Family Medicine

## 2021-05-28 DIAGNOSIS — Z1231 Encounter for screening mammogram for malignant neoplasm of breast: Secondary | ICD-10-CM

## 2021-05-28 DIAGNOSIS — M960 Pseudarthrosis after fusion or arthrodesis: Secondary | ICD-10-CM

## 2021-05-28 DIAGNOSIS — Z8739 Personal history of other diseases of the musculoskeletal system and connective tissue: Secondary | ICD-10-CM

## 2021-05-28 DIAGNOSIS — M4325 Fusion of spine, thoracolumbar region: Secondary | ICD-10-CM

## 2021-05-28 DIAGNOSIS — Z780 Asymptomatic menopausal state: Secondary | ICD-10-CM | POA: Diagnosis not present

## 2021-05-28 DIAGNOSIS — T84498A Other mechanical complication of other internal orthopedic devices, implants and grafts, initial encounter: Secondary | ICD-10-CM

## 2021-05-28 MED ORDER — OXYCODONE HCL 5 MG/5ML PO SOLN: 7.5000 mg | ORAL | 0 refills | Status: DC | PRN

## 2021-05-28 MED ORDER — PREGABALIN 75 MG PO CAPS: ORAL_CAPSULE | ORAL | 0 refills | Status: DC

## 2021-05-29 ENCOUNTER — Ambulatory Visit
Admission: RE | Admit: 2021-05-29 | Discharge: 2021-05-29 | Disposition: A | Discharge status: Home / Self Care | Payer: Medicare Other | Source: Ambulatory Visit | Attending: Family Medicine | Admitting: Family Medicine

## 2021-05-29 DIAGNOSIS — Z1231 Encounter for screening mammogram for malignant neoplasm of breast: Secondary | ICD-10-CM | POA: Diagnosis not present

## 2021-05-31 DIAGNOSIS — Z1152 Encounter for screening for COVID-19: Secondary | ICD-10-CM | POA: Diagnosis not present

## 2021-06-04 ENCOUNTER — Ambulatory Visit (INDEPENDENT_AMBULATORY_CARE_PROVIDER_SITE_OTHER): Payer: Medicare Other

## 2021-06-04 VITALS — BP 122/60 | HR 93 | Temp 99.0°F | Ht 67.0 in | Wt 161.9 lb

## 2021-06-04 DIAGNOSIS — Z0000 Encounter for general adult medical examination without abnormal findings: Secondary | ICD-10-CM | POA: Diagnosis not present

## 2021-06-04 NOTE — Progress Notes (Signed)
Subjective:   Kristen [name] is a 56 y.o. female who presents for Medicare Annual (Subsequent) preventive examination.  Review of Systems          Objective:    Today's Vitals   06/04/21 1028  BP: 122/60  Pulse: 93  Temp: 99 F (37.2 C)  TempSrc: Oral  SpO2: 98%  Weight: 161 lb 14.4 oz (73.4 kg)  Height: 5' 7"  (1.702 m)   Body mass index is 25.36 kg/m.     06/04/2021   10:52 AM 05/14/2021    7:21 PM 05/13/2021    7:57 PM 04/29/2021    2:00 AM 04/28/2021    6:17 AM 04/21/2021    9:25 AM 02/12/2021   11:11 AM  Advanced Directives  Does Patient Have a Medical Advance Directive? Yes No No Yes  Yes No  Type of Paramedic of [place];Living will   Healthcare Power of [place]   Does patient want to make changes to medical advance directive? No - Patient declined No - Patient declined No - Patient declined No - Patient declined  No - Patient declined   Copy of [place] in Chart? Yes - validated most recent copy scanned in chart (See row information)   No - copy requested No - copy requested No - copy requested   Would patient like information on creating a medical advance directive? No - Patient declined No - Patient declined         Current Medications (verified) Outpatient Encounter Medications as of 06/04/2021  Medication Sig   aspirin 81 MG chewable tablet Place 81 mg into feeding tube daily.    baclofen (LIORESAL) 10 MG tablet Take 1 tablet (10 mg total) by mouth 3 (three) times daily.   Calcium Citrate 200 MG TABS Place 200 mg into feeding tube daily at 12 noon.   Cholecalciferol (VITAMIN D3) 10 MCG (400 UNIT) tablet Take 400 Units by mouth daily.   clotrimazole (MYCELEX) 10 MG troche Take 1 tablet (10 mg total) by mouth 3 (three) times daily as needed (infection).   DEXILANT 60 MG capsule Place 1 capsule (60 mg total) into feeding tube daily.   diphenoxylate-atropine (LOMOTIL) 2.5-0.025 MG tablet  PLACE 2 TABLETS INTO FEEDING TUBE TWICE DAILY   ferrous sulfate 220 (44 Fe) MG/5ML solution PLACE 6.8MLS(300MG) INTO FEEDING TUBE DAILY   folic acid (FOLVITE) 130 MCG tablet Place 400 mcg into feeding tube daily.   lidocaine (LMX) 4 % cream Apply 1 application. topically as needed (pain).   methocarbamol (ROBAXIN) 500 MG tablet Place 1 tablet (500 mg total) into feeding tube every 6 (six) hours as needed for muscle spasms.   oxyCODONE (ROXICODONE) 5 MG/5ML solution Place 7.5 mLs (7.5 mg total) into feeding tube every 4 (four) hours as needed for moderate pain or severe pain ((score 4-6)).   potassium chloride SA (KLOR-CON) 20 MEQ tablet TAKE 1 TABLET BY MOUTH TWICE DAILY FOR THE NEXT 3 DAYS. OKAY TO CRUSH IF UNABLE TO SWALLOW. CAN ALSO TAKE PER TUBE IF NEEDED. (Patient taking differently: 20 mEq at bedtime. OKAY TO CRUSH IF UNABLE TO SWALLOW. CAN ALSO TAKE PER TUBE IF NEEDED.)   pregabalin (LYRICA) 75 MG capsule TAKE 1 CAPSULE(75 MG) BY MOUTH TWICE DAILY   sertraline (ZOLOFT) 25 MG tablet TAKE 1 TABLET(25 MG) BY MOUTH DAILY   spironolactone (ALDACTONE) 25 MG tablet Take 25 mg by mouth 2 (two) times daily.   No facility-administered  encounter medications on file as of 06/04/2021.    Allergies (verified) Other and Sulfa antibiotics   History: Past Medical History:  Diagnosis Date   Allergy    Anemia    Iron deficiency   Anxiety    Arthritis    back, left shoulder   Blood transfusion without reported diagnosis    Chicken pox    Depression    Diarrhea    takes Imodium daily   GERD (gastroesophageal reflux disease)    H/O hiatal hernia    Headache(784.0)    History of kidney stones    1996ish   History of radiation therapy 11/16/16- 01/01/17   Base of Tongue/ 66 gy in 33 fractions/ Dose: 2 Gy   Hypertension    Iron deficiency anemia 12/11/2018   Migraine    none for 5 years (as of 01/13/16)   OSA (obstructive sleep apnea) 10/13/2015   unable to get cpap, plans to get one in 2018    Pneumonia    Restless legs    Scoliosis    Shingles 08/28/2013   Shortness of breath    with exertion   Sleep apnea    Tongue cancer ([place])    tongue cancer   Past Surgical History:  Procedure Laterality Date   BACK SURGERY  07/21/1978   CARPOMETACARPEL SUSPENSION PLASTY Left 01/21/2021   Procedure: Left Thumb Ligament Carpometacarpal Arthroplasty;  Surgeon: Sherilyn Cooter, MD;  Location: [facility];  Service: Orthopedics;  Laterality: Left;   CHOLECYSTECTOMY N/A 08/15/2013   Procedure: LAPAROSCOPIC CHOLECYSTECTOMY WITH INTRAOPERATIVE CHOLANGIOGRAM;  Surgeon: Gwenyth Ober, MD;  Location: [facility];  Service: General;  Laterality: N/A;   COLONOSCOPY N/A 01/09/2013   Procedure: COLONOSCOPY;  Surgeon: Beryle Beams, MD;  Location: [facility];  Service: Endoscopy;  Laterality: N/A;   COLONOSCOPY  03/2018   DIRECT LARYNGOSCOPY  07/2016   Dr. Nicolette Bang [facility]   ESOPHAGOGASTRODUODENOSCOPY  03/2018   ESOPHAGOGASTRODUODENOSCOPY (EGD) WITH PROPOFOL N/A 04/24/2019   Procedure: ESOPHAGOGASTRODUODENOSCOPY (EGD) WITH PROPOFOL;  Surgeon: Gatha Mayer, MD;  Location: [facility];  Service: Endoscopy;  Laterality: N/A;   GASTROSTOMY TUBE PLACEMENT  09/27/2016   GIVENS CAPSULE STUDY N/A 04/24/2019   Procedure: GIVENS CAPSULE STUDY;  Surgeon: Gatha Mayer, MD;  Location: [facility];  Service: Endoscopy;  Laterality: N/A;   HERNIA REPAIR Left 1981   IR PATIENT EVAL TECH 0-60 MINS  12/13/2016   IR PATIENT EVAL TECH 0-60 MINS  04/11/2017   IR PATIENT EVAL TECH 0-60 MINS  03/24/2018   IR PATIENT EVAL TECH 0-60 MINS  11/23/2018   IR PATIENT EVAL TECH 0-60 MINS  05/28/2019   IR REPLACE G-TUBE SIMPLE WO FLUORO  12/12/2017   IR REPLACE G-TUBE SIMPLE WO FLUORO  03/29/2018   IR REPLACE G-TUBE SIMPLE WO FLUORO  06/29/2018   IR REPLACE G-TUBE SIMPLE WO FLUORO  11/02/2018   IR REPLACE G-TUBE SIMPLE WO FLUORO  03/08/2019   IR REPLACE G-TUBE SIMPLE WO FLUORO  07/02/2019   IR REPLACE G-TUBE SIMPLE WO FLUORO  11/30/2019    IR REPLACE G-TUBE SIMPLE WO FLUORO  12/27/2019   IR REPLACE G-TUBE SIMPLE WO FLUORO  05/27/2020   IR REPLACE G-TUBE SIMPLE WO FLUORO  07/09/2020   MODIFIED RADICAL NECK DISSECTION Left 09/27/2016   Levels 1 & 2   PARTIAL GLOSSECTOMY Left 09/27/2016   Left hemi partial glossectomy   SPINE SURGERY  01/20/2016   fusion   TONSILLECTOMY     tracheotomy  09/27/2016  TUBAL LIGATION  06/1988   Family History  Problem Relation Age of Onset   Heart disease Father    Heart attack Father    Hypertension Mother    Arthritis Mother    Diabetes Maternal Grandmother    Diabetes Paternal Grandmother    Diabetes Maternal Uncle    Colon polyps Neg Hx    Colon cancer Neg Hx    Esophageal cancer Neg Hx    Stomach cancer Neg Hx    Rectal cancer Neg Hx    Social History   Socioeconomic History   Marital status: Widowed    Spouse name: Not on file   Number of children: 3   Years of education: 43   Highest education level: 12th grade  Occupational History   Occupation: Air cabin crew  Tobacco Use   Smoking status: Never   Smokeless tobacco: Never  Vaping Use   Vaping Use: Never used  Substance and Sexual Activity   Alcohol use: Yes    Alcohol/week: 7.0 standard drinks    Types: 7 Cans of beer per week    Comment: occasionally   Drug use: No   Sexual activity: Yes    Birth control/protection: Surgical  Other Topics Concern   Not on file  Social History Narrative   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[employer], 3 children   Never smoker no drug use occasional alcohol averaging about 7 drinks a week   fun: Play with her grandkids, read   Social Determinants of Health   Financial Resource Strain: Medium Risk   Difficulty of Paying Living Expenses: Somewhat hard  Food Insecurity: Landscape architect Present   Worried About Charity fundraiser in the Last Year: Sometimes true   Arboriculturist in the Last Year: Sometimes true  Transportation Needs: Recruitment consultant (Medical): Yes   Lack of Transportation (Non-Medical): Yes  Physical Activity: Insufficiently Active   Days of Exercise per Week: 3 days   Minutes of Exercise per Session: 30 min  Stress: No Stress Concern Present   Feeling of Stress : Only a little  Social Connections: Moderately Integrated   Frequency of Communication with Friends and Family: More than three times a week   Frequency of Social Gatherings with Friends and Family: More than three times a week   Attends Religious Services: More than 4 times per year   Active Member of [employer] or Organizations: Yes   Attends Archivist Meetings: More than 4 times per year   Marital Status: Widowed    Tobacco Counseling Counseling given: Not Answered   Clinical Intake:  Pre-visit preparation completed: Yes  Pain : No/denies pain     BMI - recorded: 23.71 Nutritional Status: BMI of 19-24  Normal Nutritional Risks: None Diabetes: No  How often do you need to have someone help you when you read instructions, pamphlets, or other written materials from your doctor or pharmacy?: 1 - Never  Diabetic?  No  Activities of Daily Living    06/04/2021   10:49 AM 06/04/2021    7:14 AM  In your present state of health, do you have any difficulty performing the following activities:  Hearing? 0 0  Vision? 0 0  Difficulty concentrating or making decisions? 0 0  Walking or climbing stairs? 0 0  Dressing or bathing? 0 0  Doing errands, shopping? 0 0  Preparing Food and eating ? N N  Using the Toilet? N N  In the past six months, have you accidently leaked urine? N N  Do you have problems with loss of bowel control? N N  Managing your Medications? N N  Managing your Finances? N N  Housekeeping or managing your Housekeeping? N N    Patient Care Team: [name], MD as PCP - General (Family Medicine) Francina Ames, MD as Referring Physician (Otolaryngology) Eppie Gibson, MD as  Attending Physician (Radiation Oncology) Leota Sauers, RN (Inactive) as Oncology Nurse Navigator Karie Mainland, RD as Dietitian (Nutrition) Jomarie Longs, PT (Inactive) as Physical Therapist (Physical Therapy) Sharen Counter, CCC-SLP as Speech Language Pathologist (Speech Pathology) Kennith Center, LCSW as Social Worker  Indicate any recent Medical Services you may have received from other than [facility] providers in the past year (date may be approximate).     Assessment:   This is a routine wellness examination for Arelis.  Hearing/Vision screen Hearing Screening - Comments:: No hearing difficulty Vision Screening - Comments:: Wears glasses. Followed by Maudry Mayhew  Dietary issues and exercise activities discussed: Exercise limited by: None identified   Goals Addressed               This Visit's Progress     Patient stated (pt-stated)        I would like to find a part time job. Exercise more and gain some weight.       Depression Screen    06/04/2021   10:40 AM 04/03/2021   11:11 AM 05/26/2017   12:01 PM 05/13/2017   10:47 AM 01/14/2017    9:36 AM 11/30/2016    2:12 PM 10/26/2016    8:53 AM  PHQ 2/9 Scores  PHQ - 2 Score 0 4 3 0 0 0 0  PHQ- 9 Score  16 17        Fall Risk    06/04/2021   10:50 AM 06/04/2021    7:14 AM 04/03/2021   11:11 AM 05/13/2017   10:47 AM 01/14/2017    9:36 AM  Fall Risk   Falls in the past year? 1 1 1  No No  Number falls in past yr: 0 1 1    Injury with Fall? 0 0 0    Comment No injury or medical attention needed      Risk for fall due to : No Fall Risks  Other (Comment)    Follow up   Falls evaluation completed      FALL RISK PREVENTION PERTAINING TO THE HOME:  Any stairs in or around the home? No  If so, are there any without handrails? No  Home free of loose throw rugs in walkways, pet beds, electrical cords, etc? Yes  Adequate lighting in your home to reduce risk of falls? Yes   ASSISTIVE DEVICES UTILIZED TO PREVENT  FALLS:  Life alert? No  Use of a cane, walker or w/c? No  Grab bars in the bathroom? No  Shower chair or bench in shower? Yes  Elevated toilet seat or a handicapped toilet? Yes   TIMED UP AND GO:  Was the test performed? Yes .  Length of time to ambulate 10 feet: 5 sec.   Gait steady and fast without use of assistive device  Cognitive Function:      Immunizations Immunization History  Administered Date(s) Administered   Influenza Split 11/30/2014   Influenza,inj,Quad PF,6+ Mos 12/22/2015, 09/09/2016, 09/12/2017, 09/01/2018, 04/03/2021   Influenza-Unspecified 09/11/2017, 09/12/2017  PFIZER(Purple Top)SARS-COV-2 Vaccination 03/25/2019, 04/25/2019, 01/07/2020   Zoster Recombinat (Shingrix) 04/03/2021    TDAP status: Up to date  Flu Vaccine status: Up to date    Covid-19 vaccine status: Information provided on how to obtain vaccines.   Qualifies for Shingles Vaccine? Yes   Zostavax completed No   Shingrix Completed?: No.    Education has been provided regarding the importance of this vaccine. Patient has been advised to call insurance company to determine out of pocket expense if they have not yet received this vaccine. Advised may also receive vaccine at local pharmacy or Health Dept. Verbalized acceptance and understanding.  Screening Tests Health Maintenance  Topic Date Due   PAP SMEAR-Modifier  04/21/2018   COVID-19 Vaccine (4 - Booster for Pfizer series) 06/20/2021 (Originally 03/03/2020)   Zoster Vaccines- Shingrix (2 of 2) 09/04/2021 (Originally 05/29/2021)   Hepatitis C Screening  06/05/2022 (Originally 06/03/1983)   INFLUENZA VACCINE  08/18/2021   TETANUS/TDAP  05/20/2022   MAMMOGRAM  05/30/2023   COLONOSCOPY (Pts 45-16yr Insurance coverage will need to be confirmed)  04/09/2028   HIV Screening  Completed   HPV VACCINES  Aged Out    Health Maintenance  Health Maintenance Due  Topic Date Due   PAP SMEAR-Modifier  04/21/2018    Colorectal cancer  screening: Type of screening: Colonoscopy. Completed 04/10/18. Repeat every 10 years  Mammogram status: Completed 05/29/21. Repeat every year    Lung Cancer Screening: (Low Dose CT Chest recommended if Age 56-80years, 30 pack-year currently smoking OR have quit w/in 15years.) does not qualify.     Additional Screening:  Hepatitis C Screening: does qualify; Completed Patient deferred  Vision Screening: Recommended annual ophthalmology exams for early detection of glaucoma and other disorders of the eye. Is the patient up to date with their annual eye exam?  Yes  Who is the provider or what is the name of the office in which the patient attends annual eye exams[place] [facility] not established with a provider, would they like to be referred to a provider to establish care? No .   Dental Screening: Recommended annual dental exams for proper oral hygiene  Community Resource Referral / Chronic Care Management:  CRR required this visit?  No   CCM required this visit?  No      Plan:     I have personally reviewed and noted the following in the patient's chart:   Medical and social history Use of alcohol, tobacco or illicit drugs  Current medications and supplements including opioid prescriptions.  Functional ability and status Nutritional status Physical activity Advanced directives List of other physicians Hospitalizations, surgeries, and ER visits in previous 12 months Vitals Screenings to include cognitive, depression, and falls Referrals and appointments  In addition, I have reviewed and discussed with patient certain preventive protocols, quality metrics, and best practice recommendations. A written personalized care plan for preventive services as well as general preventive health recommendations were provided to patient.     BJAZZLYN HUIZENGA LPN   [id number]  Nurse Notes: Patient request f/u with concerns of hot flashes and dizzy spells. Patient due lab Hep-C  screening.

## 2021-06-04 NOTE — Patient Instructions (Addendum)
Kristen [name] , Thank you for taking time to come for your Medicare Wellness Visit. I appreciate your ongoing commitment to your health goals. Please review the following plan we discussed and let me know if I can assist you in the future.   These are the goals we discussed:  Goals       Patient stated (pt-stated)      I would like to find a part time job. Exercise more and gain some weight.        This is a list of the screening recommended for you and due dates:  Health Maintenance  Topic Date Due   Pap Smear  04/21/2018   COVID-19 Vaccine (4 - Booster for Pfizer series) 06/20/2021*   Zoster (Shingles) Vaccine (2 of 2) 09/04/2021*   Hepatitis C Screening: USPSTF Recommendation to screen - Ages 18-79 yo.  06/05/2022*   Flu Shot  08/18/2021   Tetanus Vaccine  05/20/2022   Mammogram  05/30/2023   Colon Cancer Screening  04/09/2028   HIV Screening  Completed   HPV Vaccine  Aged Out  *Topic was postponed. The date shown is not the original due date.    Opioid Pain Medicine Management Opioids are powerful medicines that are used to treat moderate to severe pain. When used for short periods of time, they can help you to: Sleep better. Do better in physical or occupational therapy. Feel better in the first few days after an injury. Recover from surgery. Opioids should be taken with the supervision of a trained health care provider. They should be taken for the shortest period of time possible. This is because opioids can be addictive, and the longer you take opioids, the greater your risk of addiction. This addiction can also be called opioid use disorder. What are the risks? Using opioid pain medicines for longer than 3 days increases your risk of side effects. Side effects include: Constipation. Nausea and vomiting. Breathing difficulties (respiratory depression). Drowsiness. Confusion. Opioid use disorder. Itching. Taking opioid pain medicine for a long period of time can  affect your ability to do daily tasks. It also puts you at risk for: Motor vehicle crashes. Depression. Suicide. Heart attack. Overdose, which can be life-threatening. What is a pain treatment plan? A pain treatment plan is an agreement between you and your health care provider. Pain is unique to each person, and treatments vary depending on your condition. To manage your pain, you and your health care provider need to work together. To help you do this: Discuss the goals of your treatment, including how much pain you might expect to have and how you will manage the pain. Review the risks and benefits of taking opioid medicines. Remember that a good treatment plan uses more than one approach and minimizes the chance of side effects. Be honest about the amount of medicines you take and about any drug or alcohol use. Get pain medicine prescriptions from only one health care provider. Pain can be managed with many types of alternative treatments. Ask your health care provider to refer you to one or more specialists who can help you manage pain through: Physical or occupational therapy. Counseling (cognitive behavioral therapy). Good nutrition. Biofeedback. Massage. Meditation. Non-opioid medicine. Following a gentle exercise program. How to use opioid pain medicine Taking medicine Take your pain medicine exactly as told by your health care provider. Take it only when you need it. If your pain gets less severe, you may take less than your prescribed dose if your  health care provider approves. If you are not having pain, do nottake pain medicine unless your health care provider tells you to take it. If your pain is severe, do nottry to treat it yourself by taking more pills than instructed on your prescription. Contact your health care provider for help. Write down the times when you take your pain medicine. It is easy to become confused while on pain medicine. Writing the time can help you  avoid overdose. Take other over-the-counter or prescription medicines only as told by your health care provider. Keeping yourself and others safe  While you are taking opioid pain medicine: Do not drive, use machinery, or power tools. Do not sign legal documents. Do not drink alcohol. Do not take sleeping pills. Do not supervise children by yourself. Do not do activities that require climbing or being in high places. Do not go to a lake, river, ocean, spa, or swimming pool. Do not share your pain medicine with anyone. Keep pain medicine in a locked cabinet or in a secure area where pets and children cannot reach it. Stopping your use of opioids If you have been taking opioid medicine for more than a few weeks, you may need to slowly decrease (taper) how much you take until you stop completely. Tapering your use of opioids can decrease your risk of symptoms of withdrawal, such as: Pain and cramping in the abdomen. Nausea. Sweating. Sleepiness. Restlessness. Uncontrollable shaking (tremors). Cravings for the medicine. Do not attempt to taper your use of opioids on your own. Talk with your health care provider about how to do this. Your health care provider may prescribe a step-down schedule based on how much medicine you are taking and how long you have been taking it. Getting rid of leftover pills Do not save any leftover pills. Get rid of leftover pills safely by: Taking the medicine to a prescription take-back program. This is usually offered by the county or law enforcement. Bringing them to a pharmacy that has a drug disposal container. Flushing them down the toilet. Check the label or package insert of your medicine to see whether this is safe to do. Throwing them out in the trash. Check the label or package insert of your medicine to see whether this is safe to do. If it is safe to throw it out, remove the medicine from the original container, put it into a sealable bag or  container, and mix it with used coffee grounds, food scraps, dirt, or cat litter before putting it in the trash. Follow these instructions at home: Activity Do exercises as told by your health care provider. Avoid activities that make your pain worse. Return to your normal activities as told by your health care provider. Ask your health care provider what activities are safe for you. General instructions You may need to take these actions to prevent or treat constipation: Drink enough fluid to keep your urine pale yellow. Take over-the-counter or prescription medicines. Eat foods that are high in fiber, such as beans, whole grains, and fresh fruits and vegetables. Limit foods that are high in fat and processed sugars, such as fried or sweet foods. Keep all follow-up visits. This is important. Where to find support If you have been taking opioids for a long time, you may benefit from receiving support for quitting from a local support group or counselor. Ask your health care provider for a referral to these resources in your area. Where to find more information Centers for Disease Control and  Prevention (CDC): http://www.wolf.info/ U.[place]. Food and Drug Administration (FDA): [place] Get help right away if: You may have taken too much of an opioid (overdosed). Common symptoms of an overdose: Your breathing is slower or more shallow than normal. You have a very slow heartbeat (pulse). You have slurred speech. You have nausea and vomiting. Your pupils become very small. You have other potential symptoms: You are very confused. You faint or feel like you will faint. You have cold, clammy skin. You have blue lips or fingernails. You have thoughts of harming yourself or harming others. These symptoms may represent a serious problem that is an emergency. Do not wait to see if the symptoms will go away. Get medical help right away. Call your local emergency services (911 in the [place] Do not drive  yourself to the hospital.  If you ever feel like you may hurt yourself or others, or have thoughts about taking your own life, get help right away. Go to your nearest emergency department or: Call your local emergency services (911 in the [place] Call the [facility] (561) 352-9162 in the [place] Call a suicide crisis helpline, such as the [facility] at (512)241-9414 or 988 in the Hall. This is open 24 hours a day in the [place] Text the Crisis Text Line at (325)735-7674 (in the [place].). Summary Opioid medicines can help you manage moderate to severe pain for a short period of time. A pain treatment plan is an agreement between you and your health care provider. Discuss the goals of your treatment, including how much pain you might expect to have and how you will manage the pain. If you think that you or someone else may have taken too much of an opioid, get medical help right away. This information is not intended to replace advice given to you by your health care provider. Make sure you discuss any questions you have with your health care provider. Document Revised: 07/30/2020 Document Reviewed: 04/16/2020 Elsevier Patient Education  [place] directives: Yes   Conditions/risks identified: None  Next appointment: Follow up in one year for your annual wellness visit.   Preventive Care 40-64 Years, Female Preventive care refers to lifestyle choices and visits with your health care provider that can promote health and wellness. What does preventive care include? A yearly physical exam. This is also called an annual well check. Dental exams once or twice a year. Routine eye exams. Ask your health care provider how often you should have your eyes checked. Personal lifestyle choices, including: Daily care of your teeth and gums. Regular physical activity. Eating a healthy diet. Avoiding tobacco and drug use. Limiting alcohol  use. Practicing safe sex. Taking low-dose aspirin daily starting at age 18. Taking vitamin and mineral supplements as recommended by your health care provider. What happens during an annual well check? The services and screenings done by your health care provider during your annual well check will depend on your age, overall health, lifestyle risk factors, and family history of disease. Counseling  Your health care provider may ask you questions about your: Alcohol use. Tobacco use. Drug use. Emotional well-being. Home and relationship well-being. Sexual activity. Eating habits. Work and work Statistician. Method of birth control. Menstrual cycle. Pregnancy history. Screening  You may have the following tests or measurements: Height, weight, and BMI. Blood pressure. Lipid and cholesterol levels. These may be checked every 5 years, or more frequently if you are over 35 years old.  Skin check. Lung cancer screening. You may have this screening every year starting at age 22 if you have a 30-pack-year history of smoking and currently smoke or have quit within the past 15 years. Fecal occult blood test (FOBT) of the stool. You may have this test every year starting at age 40. Flexible sigmoidoscopy or colonoscopy. You may have a sigmoidoscopy every 5 years or a colonoscopy every 10 years starting at age 65. Hepatitis C blood test. Hepatitis B blood test. Sexually transmitted disease (STD) testing. Diabetes screening. This is done by checking your blood sugar (glucose) after you have not eaten for a while (fasting). You may have this done every 1-3 years. Mammogram. This may be done every 1-2 years. Talk to your health care provider about when you should start having regular mammograms. This may depend on whether you have a family history of breast cancer. BRCA-related cancer screening. This may be done if you have a family history of breast, ovarian, tubal, or peritoneal cancers. Pelvic  exam and Pap test. This may be done every 3 years starting at age 54. Starting at age 46, this may be done every 5 years if you have a Pap test in combination with an HPV test. Bone density scan. This is done to screen for osteoporosis. You may have this scan if you are at high risk for osteoporosis. Discuss your test results, treatment options, and if necessary, the need for more tests with your health care provider. Vaccines  Your health care provider may recommend certain vaccines, such as: Influenza vaccine. This is recommended every year. Tetanus, diphtheria, and acellular pertussis (Tdap, Td) vaccine. You may need a Td booster every 10 years. Zoster vaccine. You may need this after age 64. Pneumococcal 13-valent conjugate (PCV13) vaccine. You may need this if you have certain conditions and were not previously vaccinated. Pneumococcal polysaccharide (PPSV23) vaccine. You may need one or two doses if you smoke cigarettes or if you have certain conditions. Talk to your health care provider about which screenings and vaccines you need and how often you need them. This information is not intended to replace advice given to you by your health care provider. Make sure you discuss any questions you have with your health care provider. Document Released: 01/31/2015 Document Revised: 09/24/2015 Document Reviewed: 11/05/2014 Elsevier Interactive Patient Education  2017 [employer] Prevention in the Home Falls can cause injuries. They can happen to people of all ages. There are many things you can do to make your home safe and to help prevent falls. What can I do on the outside of my home? Regularly fix the edges of walkways and driveways and fix any cracks. Remove anything that might make you trip as you walk through a door, such as a raised step or threshold. Trim any bushes or trees on the path to your home. Use bright outdoor lighting. Clear any walking paths of anything that might  make someone trip, such as rocks or tools. Regularly check to see if handrails are loose or broken. Make sure that both sides of any steps have handrails. Any raised decks and porches should have guardrails on the edges. Have any leaves, snow, or ice cleared regularly. Use sand or salt on walking paths during winter. Clean up any spills in your garage right away. This includes oil or grease spills. What can I do in the bathroom? Use night lights. Install grab bars by the toilet and in the tub and shower.  Do not use towel bars as grab bars. Use non-skid mats or decals in the tub or shower. If you need to sit down in the shower, use a plastic, non-slip stool. Keep the floor dry. Clean up any water that spills on the floor as soon as it happens. Remove soap buildup in the tub or shower regularly. Attach bath mats securely with double-sided non-slip rug tape. Do not have throw rugs and other things on the floor that can make you trip. What can I do in the bedroom? Use night lights. Make sure that you have a light by your bed that is easy to reach. Do not use any sheets or blankets that are too big for your bed. They should not hang down onto the floor. Have a firm chair that has side arms. You can use this for support while you get dressed. Do not have throw rugs and other things on the floor that can make you trip. What can I do in the kitchen? Clean up any spills right away. Avoid walking on wet floors. Keep items that you use a lot in easy-to-reach places. If you need to reach something above you, use a strong step stool that has a grab bar. Keep electrical cords out of the way. Do not use floor polish or wax that makes floors slippery. If you must use wax, use non-skid floor wax. Do not have throw rugs and other things on the floor that can make you trip. What can I do with my stairs? Do not leave any items on the stairs. Make sure that there are handrails on both sides of the stairs  and use them. Fix handrails that are broken or loose. Make sure that handrails are as long as the stairways. Check any carpeting to make sure that it is firmly attached to the stairs. Fix any carpet that is loose or worn. Avoid having throw rugs at the top or bottom of the stairs. If you do have throw rugs, attach them to the floor with carpet tape. Make sure that you have a light switch at the top of the stairs and the bottom of the stairs. If you do not have them, ask someone to add them for you. What else can I do to help prevent falls? Wear shoes that: Do not have high heels. Have rubber bottoms. Are comfortable and fit you well. Are closed at the toe. Do not wear sandals. If you use a stepladder: Make sure that it is fully opened. Do not climb a closed stepladder. Make sure that both sides of the stepladder are locked into place. Ask someone to hold it for you, if possible. Clearly mark and make sure that you can see: Any grab bars or handrails. First and last steps. Where the edge of each step is. Use tools that help you move around (mobility aids) if they are needed. These include: Canes. Walkers. Scooters. Crutches. Turn on the lights when you go into a dark area. Replace any light bulbs as soon as they burn out. Set up your furniture so you have a clear path. Avoid moving your furniture around. If any of your floors are uneven, fix them. If there are any pets around you, be aware of where they are. Review your medicines with your doctor. Some medicines can make you feel dizzy. This can increase your chance of falling. Ask your doctor what other things that you can do to help prevent falls. This information is not intended to replace  advice given to you by your health care provider. Make sure you discuss any questions you have with your health care provider. Document Released: 10/31/2008 Document Revised: 06/12/2015 Document Reviewed: 02/08/2014 Elsevier Interactive Patient  Education  2017 [employer].

## 2021-06-11 DIAGNOSIS — C01 Malignant neoplasm of base of tongue: Secondary | ICD-10-CM | POA: Diagnosis not present

## 2021-06-11 DIAGNOSIS — C109 Malignant neoplasm of oropharynx, unspecified: Secondary | ICD-10-CM | POA: Diagnosis not present

## 2021-06-11 DIAGNOSIS — C801 Malignant (primary) neoplasm, unspecified: Secondary | ICD-10-CM | POA: Diagnosis not present

## 2021-06-11 DIAGNOSIS — E43 Unspecified severe protein-calorie malnutrition: Secondary | ICD-10-CM | POA: Diagnosis not present

## 2021-06-11 DIAGNOSIS — R1319 Other dysphagia: Secondary | ICD-10-CM | POA: Diagnosis not present

## 2021-06-17 ENCOUNTER — Ambulatory Visit: Payer: Self-pay

## 2021-06-17 ENCOUNTER — Ambulatory Visit (INDEPENDENT_AMBULATORY_CARE_PROVIDER_SITE_OTHER): Payer: Medicare Other | Admitting: Specialist

## 2021-06-17 ENCOUNTER — Encounter: Payer: Self-pay | Admitting: Specialist

## 2021-06-17 VITALS — BP 102/69 | HR 82 | Ht 67.0 in | Wt 152.0 lb

## 2021-06-17 DIAGNOSIS — Z981 Arthrodesis status: Secondary | ICD-10-CM

## 2021-06-17 NOTE — Progress Notes (Signed)
Post-Op Visit Note   Patient: Kristen [name]           Date of Birth: January 05, 1966           MRN: [id number] Visit Date: 06/17/2021 PCP: [name], MD   Assessment & Plan: 6 weeks post op lumbar revision of L3-4 fusion with 2nd cage and BMP and extension of posterior instrumentation to the T12 level.  Chief Complaint:  Chief Complaint  Patient presents with   Lower Back - Routine Post Op   Visit Diagnoses:  1. S/P lumbar fusion     Plan: Avoid frequent bending and stooping  No lifting greater than 10 lbs. May use ice or moist heat for pain. Weight loss is of benefit.  Exercise is important to improve your indurance and does allow people to function better inspite of back pain.  Follow-Up Instructions: Return in about 6 weeks (around 07/29/2021).   Orders:  Orders Placed This Encounter  Procedures   XR Lumbar Spine 2-3 Views   No orders of the defined types were placed in this encounter.   Imaging: No results found.  PMFS History: Patient Active Problem List   Diagnosis Date Noted   Pseudarthrosis after fusion or arthrodesis 04/20/2021    Priority: High    Class: Chronic   Spinal stenosis of lumbar region 01/20/2016    Priority: High    Class: Chronic   DDD (degenerative disc disease), lumbar 01/20/2016    Priority: High    Class: Chronic   Protein-calorie malnutrition, severe 04/29/2021   Pseudoarthrosis of lumbar spine 04/28/2021   Fusion of spine of lumbar region 04/28/2021   Tenosynovitis, de [place]ervain 12/14/2020   Arthritis of carpometacarpal ([place]) joint of left thumb 12/14/2020   Aortic atherosclerosis ([place]) 08/20/2020   Hyperkalemia 07/24/2020   Alcohol use 08/05/2019   Bilateral lower extremity edema 08/05/2019   Peripheral edema 03/12/2019   Iron deficiency anemia 12/11/2018   Loosening of hardware in spine [facility])    Other spondylosis with radiculopathy, lumbar region    History of lumbar spinal fusion 11/06/2018   UTI (urinary tract  infection) 05/15/2017   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   Malignant neoplasm of base of tongue ([place]) 10/29/2016   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[place]) 10/29/2016   Headache associated with sexual activity 05/14/2016   Tongue lesion 05/14/2016   Thrombocytopenia ([place]) 01/22/2016   Chronic diarrhea 01/22/2016   Chest pain    Essential hypertension    Spinal stenosis of lumbar region with neurogenic claudication 01/20/2016   Low back pain 12/22/2015   Hypersomnolence 10/13/2015   Muscle cramp 08/01/2015   GERD (gastroesophageal reflux disease) 07/09/2015   Morbid obesity with BMI of 40.0-44.9, adult ([place]) 01/08/2013   Past Medical History:  Diagnosis Date   Allergy    Anemia    Iron deficiency   Anxiety    Arthritis    back, left shoulder   Blood transfusion without reported diagnosis    Chicken pox    Depression    Diarrhea    takes Imodium daily   GERD (gastroesophageal reflux disease)    H/O hiatal hernia    Headache(784.0)    History of kidney stones    1996ish   History of radiation therapy 11/16/16- 01/01/17   Base of Tongue/ 66 gy in 33 fractions/ Dose: 2 Gy   Hypertension    Iron deficiency anemia 12/11/2018   Migraine    none for 5  years (as of 01/13/16)   OSA (obstructive sleep apnea) 10/13/2015   unable to get cpap, plans to get one in 2018   Pneumonia    Restless legs    Scoliosis    Shingles 08/28/2013   Shortness of breath    with exertion   Sleep apnea    Tongue cancer ([place])    tongue cancer    Family History  Problem Relation Age of Onset   Heart disease Father    Heart attack Father    Hypertension Mother    Arthritis Mother    Diabetes Maternal Grandmother    Diabetes Paternal Grandmother    Diabetes Maternal Uncle    Colon polyps Neg Hx    Colon cancer Neg Hx    Esophageal cancer Neg Hx    Stomach cancer Neg Hx    Rectal cancer Neg Hx     Past Surgical History:  Procedure Laterality Date   BACK  SURGERY  07/21/1978   CARPOMETACARPEL SUSPENSION PLASTY Left 01/21/2021   Procedure: Left Thumb Ligament Carpometacarpal Arthroplasty;  Surgeon: Sherilyn Cooter, MD;  Location: [place];  Service: Orthopedics;  Laterality: Left;   CHOLECYSTECTOMY N/A 08/15/2013   Procedure: LAPAROSCOPIC CHOLECYSTECTOMY WITH INTRAOPERATIVE CHOLANGIOGRAM;  Surgeon: Gwenyth Ober, MD;  Location: [place];  Service: General;  Laterality: N/A;   COLONOSCOPY N/A 01/09/2013   Procedure: COLONOSCOPY;  Surgeon: Beryle Beams, MD;  Location: [place];  Service: Endoscopy;  Laterality: N/A;   COLONOSCOPY  03/2018   DIRECT LARYNGOSCOPY  07/2016   Dr. [facility]ico[facility]   ESOPHAGOGASTRODUODENOSCOPY  03/2018   ESOPHAGOGASTRODUODENOSCOPY (EGD) WITH PROPOFOL N/A 04/24/2019   Procedure: ESOPHAGOGASTRODUODENOSCOPY (EGD) WITH PROPOFOL;  Surgeon: Gatha Mayer, MD;  Location: [facility];  Service: Endoscopy;  Laterality: N/A;   GASTROSTOMY TUBE PLACEMENT  09/27/2016   GIVENS CAPSULE STUDY N/A 04/24/2019   Procedure: GIVENS CAPSULE STUDY;  Surgeon: Gatha Mayer, MD;  Location: [facility];  Service: Endoscopy;  Laterality: N/A;   HERNIA REPAIR Left 1981   IR PATIENT EVAL TECH 0-60 MINS  12/13/2016   IR PATIENT EVAL TECH 0-60 MINS  04/11/2017   IR PATIENT EVAL TECH 0-60 MINS  03/24/2018   IR PATIENT EVAL TECH 0-60 MINS  11/23/2018   IR PATIENT EVAL TECH 0-60 MINS  05/28/2019   IR REPLACE G-TUBE SIMPLE WO FLUORO  12/12/2017   IR REPLACE G-TUBE SIMPLE WO FLUORO  03/29/2018   IR REPLACE G-TUBE SIMPLE WO FLUORO  06/29/2018   IR REPLACE G-TUBE SIMPLE WO FLUORO  11/02/2018   IR REPLACE G-TUBE SIMPLE WO FLUORO  03/08/2019   IR REPLACE G-TUBE SIMPLE WO FLUORO  07/02/2019   IR REPLACE G-TUBE SIMPLE WO FLUORO  11/30/2019   IR REPLACE G-TUBE SIMPLE WO FLUORO  12/27/2019   IR REPLACE G-TUBE SIMPLE WO FLUORO  05/27/2020   IR REPLACE G-TUBE SIMPLE WO FLUORO  07/09/2020   MODIFIED RADICAL NECK DISSECTION Left 09/27/2016   Levels 1 & 2   PARTIAL  GLOSSECTOMY Left 09/27/2016   Left hemi partial glossectomy   SPINE SURGERY  01/20/2016   fusion   TONSILLECTOMY     tracheotomy  09/27/2016   TUBAL LIGATION  06/1988   Social History   Occupational History   Occupation: Air cabin crew  Tobacco Use   Smoking status: Never   Smokeless tobacco: Never  Vaping Use   Vaping Use: Never used  Substance and Sexual Activity   Alcohol use: Yes    Alcohol/week: 7.0 standard drinks  Types: 7 Cans of beer per week    Comment: occasionally   Drug use: No   Sexual activity: Yes    Birth control/protection: Surgical

## 2021-06-17 NOTE — Patient Instructions (Signed)
Avoid frequent bending and stooping  No lifting greater than 10 lbs. May use ice or moist heat for pain. Weight loss is of benefit.  Exercise is important to improve your indurance and does allow people to function better inspite of back pain.

## 2021-06-20 ENCOUNTER — Encounter: Payer: Self-pay | Admitting: Specialist

## 2021-06-22 DIAGNOSIS — Z20822 Contact with and (suspected) exposure to covid-19: Secondary | ICD-10-CM | POA: Diagnosis not present

## 2021-06-23 NOTE — Discharge Summary (Signed)
Patient ID: SANTANNA WHITFORD MRN: [id number] DOB/AGE: 06/23/1965 56 y.o.  Admit date: 04/28/2021 Discharge date: 05/01/2021  Admission Diagnoses:  Principal Problem:   Pseudarthrosis after fusion or arthrodesis Active Problems:   Pseudoarthrosis of lumbar spine   Fusion of spine of lumbar region   Protein-calorie malnutrition, severe   Discharge Diagnoses:  Principal Problem:   Pseudarthrosis after fusion or arthrodesis Active Problems:   Pseudoarthrosis of lumbar spine   Fusion of spine of lumbar region   Protein-calorie malnutrition, severe  status post Procedure(s): REMOVAL OF RODS AND SCREWS LUMBAR THREE TO SACRAL ONE, LUMBAR THREE-FOUR LUMBAR INTERBODY FUSION,  INSTRUMENTATION EXTENDED TO THOACIC TWELVE LEVEL, PEDICLE SCREWS, RODS, CAGE, LOCAL BONE GRAFT, ALLOGRAFT, BMP  Past Medical History:  Diagnosis Date   Allergy    Anemia    Iron deficiency   Anxiety    Arthritis    back, left shoulder   Blood transfusion without reported diagnosis    Chicken pox    Depression    Diarrhea    takes Imodium daily   GERD (gastroesophageal reflux disease)    H/O hiatal hernia    Headache(784.0)    History of kidney stones    1996ish   History of radiation therapy 11/16/16- 01/01/17   Base of Tongue/ 66 gy in 33 fractions/ Dose: 2 Gy   Hypertension    Iron deficiency anemia 12/11/2018   Migraine    none for 5 years (as of 01/13/16)   OSA (obstructive sleep apnea) 10/13/2015   unable to get cpap, plans to get one in 2018   Pneumonia    Restless legs    Scoliosis    Shingles 08/28/2013   Shortness of breath    with exertion   Sleep apnea    Tongue cancer (HCC)    tongue cancer    Surgeries: Procedure(s): REMOVAL OF RODS AND SCREWS LUMBAR THREE TO SACRAL ONE, LUMBAR THREE-FOUR LUMBAR INTERBODY FUSION,  INSTRUMENTATION EXTENDED TO THOACIC TWELVE LEVEL, PEDICLE SCREWS, RODS, CAGE, LOCAL BONE GRAFT, ALLOGRAFT, BMP on 04/28/2021   Consultants:   Discharged Condition:  Improved  Hospital Course: BERNETTE SEEMAN is an 56 y.o. female who was admitted 04/28/2021 for operative treatment of Pseudarthrosis after fusion or arthrodesis. Patient failed conservative treatments (please see the history and physical for the specifics) and had severe unremitting pain that affects sleep, daily activities and work/hobbies. After pre-op clearance, the patient was taken to the operating room on 04/28/2021 and underwent  Procedure(s): REMOVAL OF RODS AND SCREWS LUMBAR THREE TO SACRAL ONE, LUMBAR THREE-FOUR LUMBAR INTERBODY FUSION,  INSTRUMENTATION EXTENDED TO THOACIC TWELVE LEVEL, PEDICLE SCREWS, RODS, CAGE, LOCAL BONE GRAFT, ALLOGRAFT, BMP.    Patient was given perioperative antibiotics:  Anti-infectives (From admission, onward)    Start     Dose/Rate Route Frequency Ordered Stop   04/28/21 2200  ceFAZolin (ANCEF) IVPB 2g/100 mL premix        2 g 200 mL/hr over 30 Minutes Intravenous Every 8 hours 04/28/21 2037 04/29/21 0747   04/28/21 1354  vancomycin (VANCOCIN) powder  Status:  Discontinued          As needed 04/28/21 1355 04/28/21 1455   04/28/21 0615  ceFAZolin (ANCEF) IVPB 2g/100 mL premix        2 g 200 mL/hr over 30 Minutes Intravenous On call to O.R. 04/28/21 2706 04/28/21 1223        Patient was given sequential compression devices and early ambulation to prevent DVT.   Patient benefited  maximally from hospital stay and there were no complications. At the time of discharge, the patient was urinating/moving their bowels without difficulty, tolerating a regular diet, pain is controlled with oral pain medications and they have been cleared by PT/OT.   Recent vital signs: No data found.   Recent laboratory studies: No results for input(s): WBC, HGB, HCT, PLT, NA, K, CL, CO2, BUN, CREATININE, GLUCOSE, INR, CALCIUM in the last 72 hours.  Invalid input(s): PT, 2   Discharge Medications:   Allergies as of 05/01/2021       Reactions   Other Hives, Swelling    tomato   Sulfa Antibiotics Hives        Medication List     STOP taking these medications    amLODipine 10 MG tablet Commonly known as: NORVASC   diclofenac 75 MG EC tablet Commonly known as: VOLTAREN   traMADol 50 MG tablet Commonly known as: ULTRAM       TAKE these medications    aspirin 81 MG chewable tablet Place 81 mg into feeding tube daily.   baclofen 10 MG tablet Commonly known as: LIORESAL Take 1 tablet (10 mg total) by mouth 3 (three) times daily.   Calcium Citrate 200 MG Tabs Place 200 mg into feeding tube daily at 12 noon.   clotrimazole 10 MG troche Commonly known as: MYCELEX Take 1 tablet (10 mg total) by mouth 3 (three) times daily as needed (infection).   Dexilant 60 MG capsule Generic drug: dexlansoprazole Place 1 capsule (60 mg total) into feeding tube daily.   diphenoxylate-atropine 2.5-0.025 MG tablet Commonly known as: LOMOTIL PLACE 2 TABLETS INTO FEEDING TUBE TWICE DAILY   ferrous sulfate 220 (44 Fe) MG/5ML solution PLACE 6.8MLS(300MG) INTO FEEDING TUBE DAILY   folic acid 956 MCG tablet Commonly known as: FOLVITE Place 400 mcg into feeding tube daily.   lidocaine 4 % cream Commonly known as: LMX Apply 1 application. topically as needed (pain).   methocarbamol 500 MG tablet Commonly known as: ROBAXIN Place 1 tablet (500 mg total) into feeding tube every 6 (six) hours as needed for muscle spasms.   potassium chloride SA 20 MEQ tablet Commonly known as: KLOR-CON M TAKE 1 TABLET BY MOUTH TWICE DAILY FOR THE NEXT 3 DAYS. OKAY TO CRUSH IF UNABLE TO SWALLOW. CAN ALSO TAKE PER TUBE IF NEEDED. What changed:  how much to take when to take this additional instructions   spironolactone 25 MG tablet Commonly known as: ALDACTONE Take 25 mg by mouth 2 (two) times daily.   Vitamin D3 10 MCG (400 UNIT) tablet Take 400 Units by mouth daily.        Diagnostic Studies: DG BONE DENSITY (DXA)  Result Date: 05/28/2021 EXAM: DUAL X-RAY  ABSORPTIOMETRY (DXA) FOR BONE MINERAL DENSITY IMPRESSION: Referring Physician:  Jessy Oto Your patient completed a bone mineral density test using GE Lunar iDXA system (analysis version: 16). Technologist: [place] PATIENT: Name: Millenia, Waldvogel Patient ID: [id number] Birth Date: 03-31-1965 Height: 67.5 in. Sex: Female Measured: 05/28/2021 Weight: 155.8 lbs. Indications: Depression, Estrogen Deficient, Lyrica, Postmenopausal, Zoloft Fractures: Treatments: ASSESSMENT: The BMD measured at Forearm Radius 33% is 1.013 g/cm2 with a T-score of 1.6. This patient is considered normal according to [facility]) criteria. The quality of the exam is good. The lumbar spine was excluded due to recent T12-S1 fusion. Site Region Measured Date Measured Age YA BMD Significant CHANGE T-score DualFemur Neck Left 05/28/2021 55.9 1.6 1.259 g/cm2 Left Forearm Radius 33% 05/28/2021  55.9 1.6 1.013 g/cm2 DualFemur Total Mean 05/28/2021 55.9 1.9 1.253 g/cm2 World Health Organization [facility]) criteria for post-menopausal, Caucasian Women: Normal       T-score at or above -1 SD Osteopenia   T-score between -1 and -2.5 SD Osteoporosis T-score at or below -2.5 SD RECOMMENDATION: 1. All patients should optimize calcium and vitamin D intake. 2. Consider FDA-approved medical therapies in postmenopausal women and men aged 13 years and older, based on the following: a. A hip or vertebral (clinical or morphometric) fracture. b. T-score = -2.5 at the femoral neck or spine after appropriate evaluation to exclude secondary causes. c. Low bone mass (T-score between -1.0 and -2.5 at the femoral neck or spine) and a 10-year probability of a hip fracture = 3% or a 10-year probability of a major osteoporosis-related fracture = 20% based on the [place]-adapted WHO algorithm. d. Clinician judgment and/or patient preferences may indicate treatment for people with 10-year fracture probabilities above or below these levels. FOLLOW-UP: Patients with  diagnosis of osteoporosis or at high risk for fracture should have regular bone mineral density tests.? Patients eligible for Medicare are allowed routine testing every 2 years.? The testing frequency can be increased to one year for patients who have rapidly progressing disease, are receiving or discontinuing medical therapy to restore bone mass, or have additional risk factors. I have reviewed this study and agree with the findings. [facility] Electronically Signed   By: Franki Cabot M.D.   On: 05/28/2021 11:46   MM 3D SCREEN BREAST BILATERAL  Result Date: 05/29/2021 CLINICAL DATA:  Screening. EXAM: DIGITAL SCREENING BILATERAL MAMMOGRAM WITH TOMOSYNTHESIS AND CAD TECHNIQUE: Bilateral screening digital craniocaudal and mediolateral oblique mammograms were obtained. Bilateral screening digital breast tomosynthesis was performed. The images were evaluated with computer-aided detection. COMPARISON:  Previous exam(s). ACR Breast Density Category c: The breast tissue is heterogeneously dense, which may obscure small masses. FINDINGS: There are no findings suspicious for malignancy. IMPRESSION: No mammographic evidence of malignancy. A result letter of this screening mammogram will be mailed directly to the patient. RECOMMENDATION: Screening mammogram in one year. (Code:SM-B-01Y) BI-RADS CATEGORY  1: Negative. Electronically Signed   By: Claudie Revering M.D.   On: 05/29/2021 13:48    Discharge Instructions     Call MD / Call 911   Complete by: As directed    If you experience chest pain or shortness of breath, CALL 911 and be transported to the hospital emergency room.  If you develope a fever above 101 F, pus (white drainage) or increased drainage or redness at the wound, or calf pain, call your surgeon's office.   Constipation Prevention   Complete by: As directed    Drink plenty of fluids.  Prune juice may be helpful.  You may use a stool softener, such as Colace (over the counter) 100 mg  twice a day.  Use MiraLax (over the counter) for constipation as needed.   Diet - low sodium heart healthy   Complete by: As directed    Discharge instructions   Complete by: As directed    Call if there is increasing drainage, fever greater than 101.5, severe head aches, and worsening nausea or light sensitivity. If shortness of breath, bloody cough or chest tightness or pain go to an emergency room. No lifting greater than 10 lbs. Avoid bending, stooping and twisting. Use brace when sitting and out of bed even to go to bathroom. Walk in house for first 2 weeks then may start to get  out slowly increasing distances up to one mile by 4-6 weeks post op. After 5 days may shower and change dressing following bathing with shower.When bathing remove the brace shower and replace brace before getting out of the shower. If drainage, keep dry dressing and do not bathe the incision, use an moisture impervious dressing. Please call and return for scheduled follow up appointment 2 weeks from the time of surgery. Please call your primary care provider as you are off your antihypertensive agents and they will assist [place] in Restarting this medication when hypertension returns.   Increase activity slowly as tolerated   Complete by: As directed    Post-operative opioid taper instructions:   Complete by: As directed    POST-OPERATIVE OPIOID TAPER INSTRUCTIONS: It is important to wean off of your opioid medication as soon as possible. If you do not need pain medication after your surgery it is ok to stop day one. Opioids include: Codeine, Hydrocodone(Norco, Vicodin), Oxycodone(Percocet, oxycontin) and hydromorphone amongst others.  Long term and even short term use of opiods can cause: Increased pain response Dependence Constipation Depression Respiratory depression And more.  Withdrawal symptoms can include Flu like symptoms Nausea, vomiting And more Techniques to manage these symptoms Hydrate  well Eat regular healthy meals Stay active Use relaxation techniques(deep breathing, meditating, yoga) Do Not substitute Alcohol to help with tapering If you have been on opioids for less than two weeks and do not have pain than it is ok to stop all together.  Plan to wean off of opioids This plan should start within one week post op of your joint replacement. Maintain the same interval or time between taking each dose and first decrease the dose.  Cut the total daily intake of opioids by one tablet each day Next start to increase the time between doses. The last dose that should be eliminated is the evening dose.           Follow-up Information     Jessy Oto, MD Follow up in 2 week(s).   Specialty: Orthopedic Surgery Why: For wound re-check Contact information: [place] [address] [phone]                 Discharge Plan:  discharge to home  Disposition:     Signed: Benjiman Core  06/23/2021, 9:34 AM

## 2021-06-25 ENCOUNTER — Other Ambulatory Visit: Payer: Self-pay | Admitting: Specialist

## 2021-07-24 ENCOUNTER — Ambulatory Visit (INDEPENDENT_AMBULATORY_CARE_PROVIDER_SITE_OTHER): Payer: Medicare Other | Admitting: Specialist

## 2021-07-24 ENCOUNTER — Ambulatory Visit (INDEPENDENT_AMBULATORY_CARE_PROVIDER_SITE_OTHER): Payer: Medicare Other

## 2021-07-24 ENCOUNTER — Encounter: Payer: Self-pay | Admitting: Specialist

## 2021-07-24 VITALS — BP 101/65 | HR 74 | Ht 67.0 in | Wt 152.0 lb

## 2021-07-24 DIAGNOSIS — Z981 Arthrodesis status: Secondary | ICD-10-CM

## 2021-07-24 MED ORDER — OXYCODONE HCL 5 MG/5ML PO SOLN: 7.5000 mg | ORAL | 0 refills | Status: DC | PRN

## 2021-07-24 NOTE — Progress Notes (Signed)
Post-Op Visit Note   Patient: Kristen [name]           Date of Birth: 1965/06/24           MRN: [id number] Visit Date: 07/24/2021 PCP: [name], MD   Assessment & Plan: 3 months post op rearthrodesis L3-4 with extension of posterior fusion and instrumentation.  Chief Complaint:  Chief Complaint  Patient presents with   Lower Back - Follow-up  Request oxycodone for pain if necessary Strength is good in legs  No bowel or bladder difficulty Back feels better.  Visit Diagnoses:  1. S/P lumbar fusion     Plan: Avoid frequent bending and stooping  No lifting greater than 10 lbs. May use ice or moist heat for pain. Weight loss is of benefit. Exercise is important to improve your indurance and does allow people to function better inspite of back pain.  Follow-Up Instructions: No follow-ups on file.   Orders:  Orders Placed This Encounter  Procedures   XR Lumbar Spine 2-3 Views   No orders of the defined types were placed in this encounter.   Imaging: No results found.  PMFS History: Patient Active Problem List   Diagnosis Date Noted   Pseudarthrosis after fusion or arthrodesis 04/20/2021    Priority: High    Class: Chronic   Spinal stenosis of lumbar region 01/20/2016    Priority: High    Class: Chronic   DDD (degenerative disc disease), lumbar 01/20/2016    Priority: High    Class: Chronic   Protein-calorie malnutrition, severe 04/29/2021   Pseudoarthrosis of lumbar spine 04/28/2021   Fusion of spine of lumbar region 04/28/2021   Tenosynovitis, de Quervain 12/14/2020   Arthritis of carpometacarpal ([place]) joint of left thumb 12/14/2020   Aortic atherosclerosis ([place]) 08/20/2020   Hyperkalemia 07/24/2020   Alcohol use 08/05/2019   Bilateral lower extremity edema 08/05/2019   Peripheral edema 03/12/2019   Iron deficiency anemia 12/11/2018   Loosening of hardware in spine [facility])    Other spondylosis with radiculopathy, lumbar region    History of  lumbar spinal fusion 11/06/2018   UTI (urinary tract infection) 05/15/2017   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   Malignant neoplasm of base of tongue ([place]) 10/29/2016   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[place]) 10/29/2016   Headache associated with sexual activity 05/14/2016   Tongue lesion 05/14/2016   Thrombocytopenia ([place]) 01/22/2016   Chronic diarrhea 01/22/2016   Chest pain    Essential hypertension    Spinal stenosis of lumbar region with neurogenic claudication 01/20/2016   Low back pain 12/22/2015   Hypersomnolence 10/13/2015   Muscle cramp 08/01/2015   GERD (gastroesophageal reflux disease) 07/09/2015   Morbid obesity with BMI of 40.0-44.9, adult ([place]) 01/08/2013   Past Medical History:  Diagnosis Date   Allergy    Anemia    Iron deficiency   Anxiety    Arthritis    back, left shoulder   Blood transfusion without reported diagnosis    Chicken pox    Depression    Diarrhea    takes Imodium daily   GERD (gastroesophageal reflux disease)    H/O hiatal hernia    Headache(784.0)    History of kidney stones    1996ish   History of radiation therapy 11/16/16- 01/01/17   Base of Tongue/ 66 gy in 33 fractions/ Dose: 2 Gy   Hypertension    Iron deficiency anemia 12/11/2018   Migraine  none for 5 years (as of 01/13/16)   OSA (obstructive sleep apnea) 10/13/2015   unable to get cpap, plans to get one in 2018   Pneumonia    Restless legs    Scoliosis    Shingles 08/28/2013   Shortness of breath    with exertion   Sleep apnea    Tongue cancer ([place])    tongue cancer    Family History  Problem Relation Age of Onset   Heart disease Father    Heart attack Father    Hypertension Mother    Arthritis Mother    Diabetes Maternal Grandmother    Diabetes Paternal Grandmother    Diabetes Maternal Uncle    Colon polyps Neg Hx    Colon cancer Neg Hx    Esophageal cancer Neg Hx    Stomach cancer Neg Hx    Rectal cancer Neg Hx     Past  Surgical History:  Procedure Laterality Date   BACK SURGERY  07/21/1978   CARPOMETACARPEL SUSPENSION PLASTY Left 01/21/2021   Procedure: Left Thumb Ligament Carpometacarpal Arthroplasty;  Surgeon: Sherilyn Cooter, MD;  Location: [place];  Service: Orthopedics;  Laterality: Left;   CHOLECYSTECTOMY N/A 08/15/2013   Procedure: LAPAROSCOPIC CHOLECYSTECTOMY WITH INTRAOPERATIVE CHOLANGIOGRAM;  Surgeon: Gwenyth Ober, MD;  Location: [place];  Service: General;  Laterality: N/A;   COLONOSCOPY N/A 01/09/2013   Procedure: COLONOSCOPY;  Surgeon: Beryle Beams, MD;  Location: [place];  Service: Endoscopy;  Laterality: N/A;   COLONOSCOPY  03/2018   DIRECT LARYNGOSCOPY  07/2016   Dr. Nicolette Bang [facility]   ESOPHAGOGASTRODUODENOSCOPY  03/2018   ESOPHAGOGASTRODUODENOSCOPY (EGD) WITH PROPOFOL N/A 04/24/2019   Procedure: ESOPHAGOGASTRODUODENOSCOPY (EGD) WITH PROPOFOL;  Surgeon: Gatha Mayer, MD;  Location: [facility];  Service: Endoscopy;  Laterality: N/A;   GASTROSTOMY TUBE PLACEMENT  09/27/2016   GIVENS CAPSULE STUDY N/A 04/24/2019   Procedure: GIVENS CAPSULE STUDY;  Surgeon: Gatha Mayer, MD;  Location: [facility];  Service: Endoscopy;  Laterality: N/A;   HERNIA REPAIR Left 1981   IR PATIENT EVAL TECH 0-60 MINS  12/13/2016   IR PATIENT EVAL TECH 0-60 MINS  04/11/2017   IR PATIENT EVAL TECH 0-60 MINS  03/24/2018   IR PATIENT EVAL TECH 0-60 MINS  11/23/2018   IR PATIENT EVAL TECH 0-60 MINS  05/28/2019   IR REPLACE G-TUBE SIMPLE WO FLUORO  12/12/2017   IR REPLACE G-TUBE SIMPLE WO FLUORO  03/29/2018   IR REPLACE G-TUBE SIMPLE WO FLUORO  06/29/2018   IR REPLACE G-TUBE SIMPLE WO FLUORO  11/02/2018   IR REPLACE G-TUBE SIMPLE WO FLUORO  03/08/2019   IR REPLACE G-TUBE SIMPLE WO FLUORO  07/02/2019   IR REPLACE G-TUBE SIMPLE WO FLUORO  11/30/2019   IR REPLACE G-TUBE SIMPLE WO FLUORO  12/27/2019   IR REPLACE G-TUBE SIMPLE WO FLUORO  05/27/2020   IR REPLACE G-TUBE SIMPLE WO FLUORO  07/09/2020   MODIFIED RADICAL NECK  DISSECTION Left 09/27/2016   Levels 1 & 2   PARTIAL GLOSSECTOMY Left 09/27/2016   Left hemi partial glossectomy   SPINE SURGERY  01/20/2016   fusion   TONSILLECTOMY     tracheotomy  09/27/2016   TUBAL LIGATION  06/1988   Social History   Occupational History   Occupation: Air cabin crew  Tobacco Use   Smoking status: Never   Smokeless tobacco: Never  Vaping Use   Vaping Use: Never used  Substance and Sexual Activity   Alcohol use: Yes    Alcohol/week:  7.0 standard drinks of alcohol    Types: 7 Cans of beer per week    Comment: occasionally   Drug use: No   Sexual activity: Yes    Birth control/protection: Surgical

## 2021-07-24 NOTE — Patient Instructions (Signed)
Avoid frequent bending and stooping  No lifting greater than 10 lbs. May use ice or moist heat for pain. Weight loss is of benefit. Exercise is important to improve your indurance and does allow people to function better inspite of back pain.

## 2021-08-04 ENCOUNTER — Ambulatory Visit: Payer: Medicare Other | Admitting: Orthopedic Surgery

## 2021-08-11 ENCOUNTER — Ambulatory Visit (INDEPENDENT_AMBULATORY_CARE_PROVIDER_SITE_OTHER): Payer: Medicare Other | Admitting: Orthopedic Surgery

## 2021-08-11 ENCOUNTER — Ambulatory Visit (INDEPENDENT_AMBULATORY_CARE_PROVIDER_SITE_OTHER): Payer: Medicare Other

## 2021-08-11 ENCOUNTER — Ambulatory Visit: Payer: Medicare Other | Admitting: Orthopedic Surgery

## 2021-08-11 DIAGNOSIS — M654 Radial styloid tenosynovitis [de Quervain]: Secondary | ICD-10-CM | POA: Diagnosis not present

## 2021-08-11 DIAGNOSIS — M25532 Pain in left wrist: Secondary | ICD-10-CM

## 2021-08-11 MED ORDER — BETAMETHASONE SOD PHOS & ACET 6 (3-3) MG/ML IJ SUSP
6.0000 mg | INTRAMUSCULAR | Status: AC | PRN
  Administered 2021-08-11: 6 mg via INTRA_ARTICULAR

## 2021-08-11 MED ORDER — LIDOCAINE HCL 1 % IJ SOLN
1.0000 mL | INTRAMUSCULAR | Status: AC | PRN
  Administered 2021-08-11: 1 mL

## 2021-08-11 NOTE — Progress Notes (Signed)
Office Visit Note   Patient: Kristen [name]           Date of Birth: Nov 18, 1965           MRN: [id number] Visit Date: 08/11/2021              Requested by: [name], MD [place],  [place] [address] PCP: [name], MD   Assessment & Plan: Visit Diagnoses:  1. Pain in left wrist   2. Tenosynovitis, de Quervain     Plan: Patient has pain directly over the left radial styloid and just proximal in the area of the first dorsal compartment.  She has no pain at the base of the thumb at the site of the [facility] arthroplasty.  We discussed the nature of de Quervain's tenosynovitis as well as the diagnosis, prognosis, both conservative and surgical treatment options.  Patient like to try corticosteroid action the first dorsal compartment.  I can see her back again as needed.  Follow-Up Instructions: No follow-ups on file.   Orders:  Orders Placed This Encounter  Procedures   XR Wrist Complete Left   No orders of the defined types were placed in this encounter.     Procedures: Hand/UE Inj: L extensor compartment 1 for de Quervain's tenosynovitis on 08/11/2021 2:24 PM Indications: tendon swelling and therapeutic Details: 25 G needle, radial approach Medications: 1 mL lidocaine 1 %; 6 mg betamethasone acetate-betamethasone sodium phosphate 6 (3-3) MG/ML Procedure, treatment alternatives, risks and benefits explained, specific risks discussed. Consent was given by the patient. Immediately prior to procedure a time out was called to verify the correct patient, procedure, equipment, support staff and site/side marked as required. Patient was prepped and draped in the usual sterile fashion.       Clinical Data: No additional findings.   Subjective: Chief Complaint  Patient presents with   Left Wrist - Pain    This is a 56 year old female presents with pain at the radial aspect of left wrist around the radial styloid.  Is been going on for several  weeks now.  The pain is localized to the area of the radial styloid and is worse with ulnar deviation of the wrist.  She has no pain at the base of the thumb at the area of the [name] arthroplasty.    Review of Systems   Objective: Vital Signs: LMP 11/15/2013   Physical Exam  Left Hand Exam   Tenderness  Left hand tenderness location: TTP directly over the radial styloid.   Tests  Finkelstein's test: positive  Other  Erythema: absent Sensation: normal Pulse: present      Specialty Comments:  No specialty comments available.  Imaging: No results found.   PMFS History: Patient Active Problem List   Diagnosis Date Noted   Protein-calorie malnutrition, severe 04/29/2021   Pseudoarthrosis of lumbar spine 04/28/2021   Fusion of spine of lumbar region 04/28/2021   Pseudarthrosis after fusion or arthrodesis 04/20/2021    Class: Chronic   Tenosynovitis, de Quervain 12/14/2020   Arthritis of carpometacarpal (CMC) joint of left thumb 12/14/2020   Aortic atherosclerosis ([place]) 08/20/2020   Hyperkalemia 07/24/2020   Alcohol use 08/05/2019   Bilateral lower extremity edema 08/05/2019   Peripheral edema 03/12/2019   Iron deficiency anemia 12/11/2018   Loosening of hardware in spine [facility])    Other spondylosis with radiculopathy, lumbar region    History of lumbar spinal fusion 11/06/2018   UTI (urinary tract infection) 05/15/2017  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   Malignant neoplasm of base of tongue ([place]) 10/29/2016   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[place]) 10/29/2016   Headache associated with sexual activity 05/14/2016   Tongue lesion 05/14/2016   Thrombocytopenia ([place]) 01/22/2016   Chronic diarrhea 01/22/2016   Chest pain    Essential hypertension    Spinal stenosis of lumbar region 01/20/2016    Class: Chronic   DDD (degenerative disc disease), lumbar 01/20/2016    Class: Chronic   Spinal stenosis of lumbar region with neurogenic  claudication 01/20/2016   Low back pain 12/22/2015   Hypersomnolence 10/13/2015   Muscle cramp 08/01/2015   GERD (gastroesophageal reflux disease) 07/09/2015   Morbid obesity with BMI of 40.0-44.9, adult ([place]) 01/08/2013   Past Medical History:  Diagnosis Date   Allergy    Anemia    Iron deficiency   Anxiety    Arthritis    back, left shoulder   Blood transfusion without reported diagnosis    Chicken pox    Depression    Diarrhea    takes Imodium daily   GERD (gastroesophageal reflux disease)    H/O hiatal hernia    Headache(784.0)    History of kidney stones    1996ish   History of radiation therapy 11/16/16- 01/01/17   Base of Tongue/ 64 gy in 33 fractions/ Dose: 2 Gy   Hypertension    Iron deficiency anemia 12/11/2018   Migraine    none for 5 years (as of 01/13/16)   OSA (obstructive sleep apnea) 10/13/2015   unable to get cpap, plans to get one in 2018   Pneumonia    Restless legs    Scoliosis    Shingles 08/28/2013   Shortness of breath    with exertion   Sleep apnea    Tongue cancer ([place])    tongue cancer    Family History  Problem Relation Age of Onset   Heart disease Father    Heart attack Father    Hypertension Mother    Arthritis Mother    Diabetes Maternal Grandmother    Diabetes Paternal Grandmother    Diabetes Maternal Uncle    Colon polyps Neg Hx    Colon cancer Neg Hx    Esophageal cancer Neg Hx    Stomach cancer Neg Hx    Rectal cancer Neg Hx     Past Surgical History:  Procedure Laterality Date   BACK SURGERY  07/21/1978   CARPOMETACARPEL SUSPENSION PLASTY Left 01/21/2021   Procedure: Left Thumb Ligament Carpometacarpal Arthroplasty;  Surgeon: Sherilyn Cooter, MD;  Location: [facility];  Service: Orthopedics;  Laterality: Left;   CHOLECYSTECTOMY N/A 08/15/2013   Procedure: LAPAROSCOPIC CHOLECYSTECTOMY WITH INTRAOPERATIVE CHOLANGIOGRAM;  Surgeon: Gwenyth Ober, MD;  Location: [facility];  Service: General;  Laterality: N/A;   COLONOSCOPY N/A  01/09/2013   Procedure: COLONOSCOPY;  Surgeon: Beryle Beams, MD;  Location: [facility];  Service: Endoscopy;  Laterality: N/A;   COLONOSCOPY  03/2018   DIRECT LARYNGOSCOPY  07/2016   Dr. Nicolette Bang [facility]   ESOPHAGOGASTRODUODENOSCOPY  03/2018   ESOPHAGOGASTRODUODENOSCOPY (EGD) WITH PROPOFOL N/A 04/24/2019   Procedure: ESOPHAGOGASTRODUODENOSCOPY (EGD) WITH PROPOFOL;  Surgeon: Gatha Mayer, MD;  [facility];  Service: Endoscopy;  Laterality: N/A;   GASTROSTOMY TUBE PLACEMENT  09/27/2016   GIVENS CAPSULE STUDY N/A 04/24/2019   Procedure: GIVENS CAPSULE STUDY;  Surgeon: Gatha Mayer, MD;  [facility]: [facility];  Service: Endoscopy;  Laterality: N/A;  HERNIA REPAIR Left 1981   IR PATIENT EVAL TECH 0-60 MINS  12/13/2016   IR PATIENT EVAL TECH 0-60 MINS  04/11/2017   IR PATIENT EVAL TECH 0-60 MINS  03/24/2018   IR PATIENT EVAL TECH 0-60 MINS  11/23/2018   IR PATIENT EVAL TECH 0-60 MINS  05/28/2019   IR REPLACE G-TUBE SIMPLE WO FLUORO  12/12/2017   IR REPLACE G-TUBE SIMPLE WO FLUORO  03/29/2018   IR REPLACE G-TUBE SIMPLE WO FLUORO  06/29/2018   IR REPLACE G-TUBE SIMPLE WO FLUORO  11/02/2018   IR REPLACE G-TUBE SIMPLE WO FLUORO  03/08/2019   IR REPLACE G-TUBE SIMPLE WO FLUORO  07/02/2019   IR REPLACE G-TUBE SIMPLE WO FLUORO  11/30/2019   IR REPLACE G-TUBE SIMPLE WO FLUORO  12/27/2019   IR REPLACE G-TUBE SIMPLE WO FLUORO  05/27/2020   IR REPLACE G-TUBE SIMPLE WO FLUORO  07/09/2020   MODIFIED RADICAL NECK DISSECTION Left 09/27/2016   Levels 1 & 2   PARTIAL GLOSSECTOMY Left 09/27/2016   Left hemi partial glossectomy   SPINE SURGERY  01/20/2016   fusion   TONSILLECTOMY     tracheotomy  09/27/2016   TUBAL LIGATION  06/1988   Social History   Occupational History   Occupation: Air cabin crew  Tobacco Use   Smoking status: Never   Smokeless tobacco: Never  Vaping Use   Vaping Use: Never used  Substance and Sexual Activity   Alcohol use: Yes    Alcohol/week: 7.0 standard drinks of  alcohol    Types: 7 Cans of beer per week    Comment: occasionally   Drug use: No   Sexual activity: Yes    Birth control/protection: Surgical

## 2021-08-20 DIAGNOSIS — Z20822 Contact with and (suspected) exposure to covid-19: Secondary | ICD-10-CM | POA: Diagnosis not present

## 2021-08-21 ENCOUNTER — Ambulatory Visit: Payer: Medicare Other | Admitting: Specialist

## 2021-08-23 ENCOUNTER — Other Ambulatory Visit: Payer: Self-pay | Admitting: Specialist

## 2021-08-23 ENCOUNTER — Other Ambulatory Visit: Payer: Self-pay | Admitting: Family Medicine

## 2021-08-23 DIAGNOSIS — I10 Essential (primary) hypertension: Secondary | ICD-10-CM

## 2021-08-24 ENCOUNTER — Other Ambulatory Visit: Payer: Self-pay

## 2021-08-24 ENCOUNTER — Emergency Department (HOSPITAL_COMMUNITY)
Admission: EM | Admit: 2021-08-24 | Discharge: 2021-08-24 | Disposition: A | Discharge status: Home / Self Care | Payer: Medicare Other | Attending: Emergency Medicine | Admitting: Emergency Medicine

## 2021-08-24 ENCOUNTER — Encounter (HOSPITAL_COMMUNITY): Payer: Self-pay | Admitting: Oncology

## 2021-08-24 DIAGNOSIS — N200 Calculus of kidney: Secondary | ICD-10-CM | POA: Insufficient documentation

## 2021-08-24 DIAGNOSIS — Z7982 Long term (current) use of aspirin: Secondary | ICD-10-CM | POA: Insufficient documentation

## 2021-08-24 DIAGNOSIS — R109 Unspecified abdominal pain: Secondary | ICD-10-CM | POA: Diagnosis present

## 2021-08-24 DIAGNOSIS — R7401 Elevation of levels of liver transaminase levels: Secondary | ICD-10-CM | POA: Diagnosis not present

## 2021-08-24 DIAGNOSIS — N12 Tubulo-interstitial nephritis, not specified as acute or chronic: Secondary | ICD-10-CM

## 2021-08-24 DIAGNOSIS — I10 Essential (primary) hypertension: Secondary | ICD-10-CM | POA: Diagnosis not present

## 2021-08-24 DIAGNOSIS — R7989 Other specified abnormal findings of blood chemistry: Secondary | ICD-10-CM

## 2021-08-24 LAB — COMPREHENSIVE METABOLIC PANEL
ALT: 87 U/L — ABNORMAL HIGH (ref 0–44)
AST: 90 U/L — ABNORMAL HIGH (ref 15–41)
Albumin: 3.6 g/dL (ref 3.5–5.0)
Alkaline Phosphatase: 187 U/L — ABNORMAL HIGH (ref 38–126)
Anion gap: 10 (ref 5–15)
BUN: 20 mg/dL (ref 6–20)
CO2: 25 mmol/L (ref 22–32)
Calcium: 8.8 mg/dL — ABNORMAL LOW (ref 8.9–10.3)
Chloride: 101 mmol/L (ref 98–111)
Creatinine, Ser: 0.97 mg/dL (ref 0.44–1.00)
GFR, Estimated: >60 mL/min (ref >60)
Glucose, Bld: 112 mg/dL — ABNORMAL HIGH (ref 70–99)
Potassium: 3.6 mmol/L (ref 3.5–5.1)
Sodium: 136 mmol/L (ref 135–145)
Total Bilirubin: 1.9 mg/dL — ABNORMAL HIGH (ref 0.3–1.2)
Total Protein: 7.5 g/dL (ref 6.5–8.1)

## 2021-08-24 LAB — CBC
HCT: 34.5 % — ABNORMAL LOW (ref 36.0–46.0)
Hemoglobin: 11.2 g/dL — ABNORMAL LOW (ref 12.0–15.0)
MCH: 28.1 pg (ref 26.0–34.0)
MCHC: 32.5 g/dL (ref 30.0–36.0)
MCV: 86.7 fL (ref 80.0–100.0)
Platelets: 133 10*3/uL — ABNORMAL LOW (ref 150–400)
RBC: 3.98 MIL/uL (ref 3.87–5.11)
RDW: 15.1 % (ref 11.5–15.5)
WBC: 8.4 10*3/uL (ref 4.0–10.5)
nRBC: 0 % (ref 0.0–0.2)

## 2021-08-24 LAB — URINALYSIS, ROUTINE W REFLEX MICROSCOPIC
Bilirubin Urine: NEGATIVE
Glucose, UA: NEGATIVE mg/dL
Ketones, ur: NEGATIVE mg/dL
Nitrite: POSITIVE — AB
Protein, ur: 100 mg/dL — AB
Specific Gravity, Urine: 1.013 (ref 1.005–1.030)
WBC, UA: >50 WBC/hpf — ABNORMAL HIGH (ref 0–5)
pH: 7 (ref 5.0–8.0)

## 2021-08-24 LAB — LACTIC ACID, PLASMA: Lactic Acid, Venous: 0.5 mmol/L (ref 0.5–1.9)

## 2021-08-24 LAB — LIPASE, BLOOD: Lipase: 21 U/L (ref 11–51)

## 2021-08-24 MED ORDER — CEFPODOXIME PROXETIL 200 MG PO TABS: 200.0000 mg | ORAL_TABLET | Freq: Two times a day (BID) | ORAL | 0 refills | Status: DC

## 2021-08-24 MED ORDER — ACETAMINOPHEN 325 MG PO TABS
650.0000 mg | ORAL_TABLET | Freq: Once | ORAL | Status: AC
  Administered 2021-08-24: 650 mg via ORAL
  Filled 2021-08-24: qty 2

## 2021-08-24 MED ORDER — SODIUM CHLORIDE 0.9 % IV SOLN
1.0000 g | Freq: Once | INTRAVENOUS | Status: AC
  Administered 2021-08-24: 1 g via INTRAVENOUS
  Filled 2021-08-24: qty 10

## 2021-08-24 MED ORDER — ONDANSETRON HCL 4 MG/2ML IJ SOLN
4.0000 mg | Freq: Once | INTRAMUSCULAR | Status: AC
  Administered 2021-08-24: 4 mg via INTRAVENOUS
  Filled 2021-08-24: qty 2

## 2021-08-24 MED ORDER — ONDANSETRON 4 MG PO TBDP: 4.0000 mg | ORAL_TABLET | Freq: Three times a day (TID) | ORAL | 0 refills | Status: DC | PRN

## 2021-08-24 MED ORDER — MORPHINE SULFATE (PF) 4 MG/ML IV SOLN
4.0000 mg | Freq: Once | INTRAVENOUS | Status: AC
  Administered 2021-08-24: 4 mg via INTRAVENOUS
  Filled 2021-08-24: qty 1

## 2021-08-24 MED ORDER — SODIUM CHLORIDE 0.9 % IV BOLUS
1000.0000 mL | Freq: Once | INTRAVENOUS | Status: AC
  Administered 2021-08-24: 1000 mL via INTRAVENOUS

## 2021-08-24 NOTE — ED Provider Notes (Signed)
[facility] DEPT Provider Note   CSN: [id number] Arrival date & time: 08/24/21  1642     History  Chief Complaint  Patient presents with   Abdominal Pain    Kristen [name] is a 56 y.o. female.  Patient presents to the emergency department today for evaluation of abdominal pain, dysuria, urinary frequency, bilateral back pain.  She states that symptoms have been going on for about a week but have been worse over the past 4 days.  She has had nausea without vomiting.  No diarrhea.  Reports decreased appetite and she feels very dehydrated.  No fevers, chest pain or shortness of breath.  She has has a history of UTI with Enterobacter per records.  Reports history of cholecystectomy.       Home Medications Prior to Admission medications   Medication Sig Start Date End Date Taking? Authorizing Provider  cefpodoxime (VANTIN) 200 MG tablet Take 1 tablet (200 mg total) by mouth 2 (two) times daily. 08/24/21  Yes Carlisle Cater, PA-C  ondansetron (ZOFRAN-ODT) 4 MG disintegrating tablet Take 1 tablet (4 mg total) by mouth every 8 (eight) hours as needed for nausea or vomiting. 08/24/21  Yes Carlisle Cater, PA-C  aspirin 81 MG chewable tablet Place 81 mg into feeding tube daily.  10/05/16   [provider]  baclofen (LIORESAL) 10 MG tablet TAKE 1 TABLET(10 MG) BY MOUTH THREE TIMES DAILY 08/24/21   Jessy Oto, MD  Calcium Citrate 200 MG TABS Place 200 mg into feeding tube daily at 12 noon.    [provider]  Cholecalciferol (VITAMIN D3) 10 MCG (400 UNIT) tablet Take 400 Units by mouth daily.    [provider]  clotrimazole (MYCELEX) 10 MG troche Take 1 tablet (10 mg total) by mouth 3 (three) times daily as needed (infection). 10/28/20   Causey, Charlestine Massed, NP  DEXILANT 60 MG capsule Place 1 capsule (60 mg total) into feeding tube daily. 07/26/20   Patrecia Pour, MD  diphenoxylate-atropine (LOMOTIL) 2.5-0.025 MG tablet PLACE 2 TABLETS  INTO FEEDING TUBE TWICE DAILY 01/07/21   Gatha Mayer, MD  ferrous sulfate 220 (44 Fe) MG/5ML solution PLACE 6.8MLS(300MG) INTO FEEDING TUBE DAILY 11/28/20   Gatha Mayer, MD  folic acid (FOLVITE) 675 MCG tablet Place 400 mcg into feeding tube daily.    [provider]  lidocaine (LMX) 4 % cream Apply 1 application. topically as needed (pain).    [provider]  methocarbamol (ROBAXIN) 500 MG tablet Place 1 tablet (500 mg total) into feeding tube every 6 (six) hours as needed for muscle spasms. 05/01/21   Jessy Oto, MD  oxyCODONE (ROXICODONE) 5 MG/5ML solution Place 7.5 mLs (7.5 mg total) into feeding tube every 4 (four) hours as needed for moderate pain or severe pain ((score 4-6)). 07/24/21   Jessy Oto, MD  potassium chloride SA (KLOR-CON) 20 MEQ tablet TAKE 1 TABLET BY MOUTH TWICE DAILY FOR THE NEXT 3 DAYS. OKAY TO CRUSH IF UNABLE TO SWALLOW. CAN ALSO TAKE PER TUBE IF NEEDED. Patient taking differently: 20 mEq at bedtime. OKAY TO CRUSH IF UNABLE TO SWALLOW. CAN ALSO TAKE PER TUBE IF NEEDED. 08/13/19   [name], MD  pregabalin (LYRICA) 75 MG capsule TAKE 1 CAPSULE(75 MG) BY MOUTH TWICE DAILY 06/25/21   Jessy Oto, MD  sertraline (ZOLOFT) 25 MG tablet TAKE 1 TABLET(25 MG) BY MOUTH DAILY 05/12/21   [name], MD  spironolactone (ALDACTONE) 25  MG tablet Take 25 mg by mouth 2 (two) times daily. 11/19/20   [provider]      Allergies    Other and Sulfa antibiotics    Review of Systems   Review of Systems  Physical Exam Updated Vital Signs BP 104/66   Pulse 86   Temp (!) 100.5 F (38.1 C) (Oral) Comment: RN notified  Resp 16   Ht 5' 7"  (1.702 m)   Wt 68 kg   LMP 11/15/2013   SpO2 98%   BMI 23.49 kg/m  Physical Exam Vitals and nursing note reviewed.  Constitutional:      General: She is not in acute distress.    Appearance: She is well-developed.  HENT:     Head: Normocephalic and atraumatic.     Right Ear: External ear  normal.     Left Ear: External ear normal.     Nose: Nose normal.  Eyes:     Conjunctiva/sclera: Conjunctivae normal.  Cardiovascular:     Rate and Rhythm: Normal rate and regular rhythm.     Heart sounds: No murmur heard. Pulmonary:     Effort: No respiratory distress.     Breath sounds: No wheezing, rhonchi or rales.  Abdominal:     Palpations: Abdomen is soft.     Tenderness: There is abdominal tenderness in the periumbilical area and suprapubic area. There is no guarding or rebound.  Musculoskeletal:     Cervical back: Normal range of motion and neck supple.     Right lower leg: No edema.     Left lower leg: No edema.  Skin:    General: Skin is warm and dry.     Findings: No rash.  Neurological:     General: No focal deficit present.     Mental Status: She is alert. Mental status is at baseline.     Motor: No weakness.  Psychiatric:        Mood and Affect: Mood normal.     ED Results / Procedures / Treatments   Labs (all labs ordered are listed, but only abnormal results are displayed) Labs Reviewed  COMPREHENSIVE METABOLIC PANEL - Abnormal; Notable for the following components:      Result Value   Glucose, Bld 112 (*)    Calcium 8.8 (*)    AST 90 (*)    ALT 87 (*)    Alkaline Phosphatase 187 (*)    Total Bilirubin 1.9 (*)    All other components within normal limits  CBC - Abnormal; Notable for the following components:   Hemoglobin 11.2 (*)    HCT 34.5 (*)    Platelets 133 (*)    All other components within normal limits  URINALYSIS, ROUTINE W REFLEX MICROSCOPIC - Abnormal; Notable for the following components:   Color, Urine AMBER (*)    APPearance CLOUDY (*)    Hgb urine dipstick MODERATE (*)    Protein, ur 100 (*)    Nitrite POSITIVE (*)    Leukocytes,Ua LARGE (*)    WBC, UA >50 (*)    Bacteria, UA FEW (*)    All other components within normal limits  URINE CULTURE  LIPASE, BLOOD  LACTIC ACID, PLASMA  LACTIC ACID, PLASMA     EKG None  Radiology No results found.  Procedures Procedures    Medications Ordered in ED Medications  acetaminophen (TYLENOL) tablet 650 mg (has no administration in time range)  sodium chloride 0.9 % bolus 1,000 mL (1,000 mLs Intravenous New  Bag/Given 08/24/21 2043)  morphine (PF) 4 MG/ML injection 4 mg (4 mg Intravenous Given 08/24/21 2026)  ondansetron (ZOFRAN) injection 4 mg (4 mg Intravenous Given 08/24/21 2025)  cefTRIAXone (ROCEPHIN) 1 g in sodium chloride 0.9 % 100 mL IVPB (0 g Intravenous Stopped 08/24/21 2044)    ED Course/ Medical Decision Making/ A&P    Patient seen and examined. History obtained directly from patient.  Also reviewed previous evaluations, urine culture results.  Work-up including labs, imaging, EKG ordered in triage, if performed, were reviewed.    Over the past 2 years patient has had Enterobacter UTI, resistant to Augmentin and first-generation cephalosporin.  Labs/EKG: Independently reviewed and interpreted.  This included: CBC with normal white blood cell count 8.4, hemoglobin 11.2 appears to be baseline; CMP mild elevation in transaminases and alkaline phosphatase, T. bili; lipase normal: Urine appears grossly infected with greater than 50 white blood cells with white blood cell clumps, large leuk esterase, nitrite positive.  Added lactate.  Imaging: None ordered.  Medications/Fluids: Ordered IV ceftriaxone and IV fluid bolus.  We will give dose of IV morphine and Zofran for symptom control.    Most recent vital signs reviewed and are as follows: BP 104/66   Pulse 86   Temp (!) 100.5 F (38.1 C) (Oral) Comment: RN notified  Resp 16   Ht 5' 7"  (1.702 m)   Wt 68 kg   LMP 11/15/2013   SpO2 98%   BMI 23.49 kg/m   Initial impression: Abdominal pain, UTI  9:42 PM Reassessment performed. Patient appears stable, well-appearing.  Labs personally reviewed and interpreted including: Lactate normal.  Ordered Tylenol for temperature of 100.5  F.  Reviewed pertinent lab work and imaging with patient at bedside. Questions answered.   Most current vital signs reviewed and are as follows: BP 104/66   Pulse 86   Temp (!) 100.5 F (38.1 C) (Oral) Comment: RN notified  Resp 16   Ht 5' 7"  (1.702 m)   Wt 68 kg   LMP 11/15/2013   SpO2 98%   BMI 23.49 kg/m   Plan: Complete IV antibiotics and IV fluids, discharged home on cefpodoxime.  Patient states that she is able to follow-up with her doctor in the next 2 to 3 days for recheck.  Will also need elevated LFTs addressed.  She is comfortable with discharge.  9:42 PM  The patient was urged to return to the Emergency Department immediately with worsening of current symptoms, worsening abdominal pain, persistent vomiting, blood noted in stools, persistent fever above 101 degrees Fahrenheit, or any other concerns. The patient verbalized understanding.                              Medical Decision Making Amount and/or Complexity of Data Reviewed Labs: ordered.  Risk Prescription drug management.   For this patient's complaint of abdominal pain, the following conditions were considered on the differential diagnosis: gastritis/PUD, enteritis/duodenitis, appendicitis, cholelithiasis/cholecystitis, cholangitis, pancreatitis, ruptured viscus, colitis, diverticulitis, small/large bowel obstruction, proctitis, cystitis, pyelonephritis, ureteral colic, aortic dissection, aortic aneurysm. In women, ectopic pregnancy, pelvic inflammatory disease, ovarian cysts, and tubo-ovarian abscess were also considered. Atypical chest etiologies were also considered including ACS, PE, and pneumonia.   The patient's vital signs, pertinent lab work and imaging were reviewed and interpreted as discussed in the ED course. Hospitalization was considered for further testing, treatments, or serial exams/observation. However as patient is well-appearing, has a stable exam, and reassuring studies  today, I do not  feel that they warrant admission at this time. This plan was discussed with the patient who verbalizes agreement and comfort with this plan and seems reliable and able to return to the Emergency Department with worsening or changing symptoms.         Final Clinical Impression(s) / ED Diagnoses Final diagnoses:  Pyelonephritis  Elevated LFTs    Rx / DC Orders ED Discharge Orders          Ordered    cefpodoxime (VANTIN) 200 MG tablet  2 times daily        08/24/21 2127    ondansetron (ZOFRAN-ODT) 4 MG disintegrating tablet  Every 8 hours PRN        08/24/21 2129              Carlisle Cater, PA-C 08/24/21 2143    Fransico Meadow, MD 08/26/21 1356

## 2021-08-24 NOTE — ED Provider Triage Note (Signed)
Emergency Medicine Provider Triage Evaluation Note  Kristen [name] , a 56 y.o. female  was evaluated in triage.  Pt complains of abdominal pain and urinary frequency.  Patient reports that symptoms started on Thursday.  Abdominal pain has been constant and getting progressively worse.  Patient reports the pain is all over her abdomen.  Patient also endorses urinary frequency.  Patient states that when she tried to drink fluids earlier today she felt nauseous and almost vomited.  Review of Systems  Positive: Chills, generalized abdominal pain, urinary frequency Negative: Fever, vomiting, blood in stool, melena, dysuria, hematuria  Physical Exam  BP 101/74 (BP Location: Left Arm)   Pulse 99   Temp 100.1 F (37.8 C) (Oral)   Resp 20   Ht 5' 7"  (1.702 m)   Wt 68 kg   LMP 11/15/2013   SpO2 100%   BMI 23.49 kg/m  Gen:   Awake, no distress   Resp:  Normal effort  MSK:   Moves extremities without difficulty  Other:  Abdomen soft, nondistended, tenderness to epigastric area.  No guarding or rebound tenderness.  Medical Decision Making  Medically screening exam initiated at 5:26 PM.  Appropriate orders placed.  Kristen [name] was informed that the remainder of the evaluation will be completed by another provider, this initial triage assessment does not replace that evaluation, and the importance of remaining in the ED until their evaluation is complete.     Loni Beckwith, [place] 08/24/21 1727

## 2021-08-24 NOTE — ED Triage Notes (Signed)
Pt reports abdominal pain, urgency and frequency w/ urination that began on Friday.

## 2021-08-24 NOTE — Discharge Instructions (Signed)
Please read and follow all provided instructions.  Your diagnoses today include:  1. Urinary tract infection without hematuria, site unspecified     Tests performed today include: Complete blood cell count: Normal infection fighting cell count Complete metabolic panel: High liver function tests, please have these rechecked by your doctor Lipase (pancreas function test): Normal Urinalysis (urine test): Shows signs of infection in the bladder Lactate: Test for severe sepsis was okay Urine culture: Pending results Vital signs. See below for your results today.   Medications prescribed:  Cefpodoxime: Antibiotic for UTI  Zofran (ondansetron) - for nausea and vomiting  Home care instructions:  Follow any educational materials contained in this packet.  Follow-up instructions: Please follow-up with your primary care provider in 3 days if symptoms are not resolved for further evaluation of your symptoms.  Return instructions:  Please return to the Emergency Department if you experience worsening symptoms.  Return with fever, worsening pain, persistent vomiting, worsening pain in your back.  Please return if you have any other emergent concerns.  Additional Information:  Your vital signs today were: BP 104/66   Pulse 86   Temp (!) 100.5 F (38.1 C) (Oral) Comment: RN notified  Resp 16   Ht 5' 7"  (1.702 m)   Wt 68 kg   LMP 11/15/2013   SpO2 98%   BMI 23.49 kg/m  If your blood pressure (BP) was elevated above 135/85 this visit, please have this repeated by your doctor within one month. --------------

## 2021-08-27 ENCOUNTER — Other Ambulatory Visit: Payer: Self-pay | Admitting: Internal Medicine

## 2021-08-27 ENCOUNTER — Ambulatory Visit (INDEPENDENT_AMBULATORY_CARE_PROVIDER_SITE_OTHER): Payer: Medicare Other | Admitting: Family Medicine

## 2021-08-27 VITALS — BP 98/68 | HR 78 | Temp 98.9°F | Wt 165.4 lb

## 2021-08-27 DIAGNOSIS — F321 Major depressive disorder, single episode, moderate: Secondary | ICD-10-CM

## 2021-08-27 DIAGNOSIS — B370 Candidal stomatitis: Secondary | ICD-10-CM | POA: Diagnosis not present

## 2021-08-27 DIAGNOSIS — I10 Essential (primary) hypertension: Secondary | ICD-10-CM | POA: Diagnosis not present

## 2021-08-27 DIAGNOSIS — R7989 Other specified abnormal findings of blood chemistry: Secondary | ICD-10-CM

## 2021-08-27 DIAGNOSIS — R1310 Dysphagia, unspecified: Secondary | ICD-10-CM

## 2021-08-27 DIAGNOSIS — E061 Subacute thyroiditis: Secondary | ICD-10-CM

## 2021-08-27 DIAGNOSIS — F411 Generalized anxiety disorder: Secondary | ICD-10-CM

## 2021-08-27 DIAGNOSIS — N12 Tubulo-interstitial nephritis, not specified as acute or chronic: Secondary | ICD-10-CM | POA: Diagnosis not present

## 2021-08-27 LAB — URINE CULTURE: Culture: >=100000 — AB

## 2021-08-27 MED ORDER — MAGIC MOUTHWASH: ORAL | 0 refills | Status: DC

## 2021-08-27 MED ORDER — AMLODIPINE BESYLATE 10 MG PO TABS: 10.0000 mg | ORAL_TABLET | Freq: Every day | ORAL | 3 refills | Status: DC

## 2021-08-27 MED ORDER — SERTRALINE HCL 25 MG PO TABS: ORAL_TABLET | ORAL | 3 refills | Status: DC

## 2021-08-27 NOTE — Patient Instructions (Addendum)
Strep testing negative.  A prescription for Magic mouthwash printed out.  Referral to gastroenterology and an order for right upper quadrant ultrasound were placed.  You should receive phone calls about scheduling these appointments.  Refills also sent to pharmacy.

## 2021-08-27 NOTE — Progress Notes (Signed)
Subjective:    Patient ID: Kristen [name], female    DOB: November 04, 1965, 56 y.o.   MRN: [id number]  Chief Complaint  Patient presents with   Follow-up    HPI Patient was seen today for ED follow-up.  Patient seen 08/24/2021 for pyelonephritis. Given IVFs and IV abx.  D/c home with cefpodoxime.  Elevated LFTs noted while in ED.  Alk phos 27, AST 90, ALT 87.  Denies abdominal pain.  Pt states feeling better since taking abx.  No longer having urgency and back pain.    Has dysphagia that started around the same time as pyelonephritis.  Having difficulty with solids and liquids, feels like they get stuck.  Saliva pools in mouth.  Previously seen by GI.  No recent visits.  Seen by hand surgeon 08/11/2021 for constant pain on lateral aspect of wrist.  Tried ice, rest, splint.  Had back surgery April 2023 which went well.  Requesting refill on Zoloft for h/o anxiety/depression. Past Medical History:  Diagnosis Date   Allergy    Anemia    Iron deficiency   Anxiety    Arthritis    back, left shoulder   Blood transfusion without reported diagnosis    Chicken pox    Depression    Diarrhea    takes Imodium daily   GERD (gastroesophageal reflux disease)    H/O hiatal hernia    Headache(784.0)    History of kidney stones    1996ish   History of radiation therapy 11/16/16- 01/01/17   Base of Tongue/ 66 gy in 33 fractions/ Dose: 2 Gy   Hypertension    Iron deficiency anemia 12/11/2018   Migraine    none for 5 years (as of 01/13/16)   OSA (obstructive sleep apnea) 10/13/2015   unable to get cpap, plans to get one in 2018   Pneumonia    Restless legs    Scoliosis    Shingles 08/28/2013   Shortness of breath    with exertion   Sleep apnea    Tongue cancer (HCC)    tongue cancer    Allergies  Allergen Reactions   Other Hives and Swelling    tomato   Sulfa Antibiotics Hives    ROS General: Denies fever, chills, night sweats, changes in weight, changes in appetite HEENT: Denies  headaches, ear pain, changes in vision, rhinorrhea, sore throat + dysphagia CV: Denies CP, palpitations, SOB, orthopnea Pulm: Denies SOB, cough, wheezing GI: Denies abdominal pain, nausea, vomiting, diarrhea, constipation GU: Denies dysuria, hematuria, frequency, vaginal discharge Msk: Denies muscle cramps, joint pains + left wrist pain Neuro: Denies weakness, numbness, tingling Skin: Denies rashes, bruising Psych: Denies depression, anxiety, hallucinations      Objective:    Blood pressure 98/68, pulse 78, temperature 98.9 F (37.2 C), temperature source Oral, weight 165 lb 6.4 oz (75 kg), last menstrual period 11/15/2013, SpO2 95 %.  Gen. Pleasant, well-nourished, in no distress, normal affect   HEENT: [place]/AT, face symmetric, conjunctiva clear, no scleral icterus, PERRLA, EOMI, nares patent without drainage, edentulous, pharynx with single spot of exudate midline, mild erythema. Lungs: no accessory muscle use, CTAB, no wheezes or rales Cardiovascular: RRR, no m/r/g, no peripheral edema Abdomen: BS present, soft, NT/ND, no hepatosplenomegaly. Neuro:  A&Ox3, CN II-XII intact, normal gait Skin:  Warm, no lesions/ rash   Wt Readings from Last 3 Encounters:  08/24/21 150 lb (68 kg)  07/24/21 152 lb (68.9 kg)  06/17/21 152 lb (68.9 kg)    Lab Results  Component Value Date   WBC 8.4 08/24/2021   HGB 11.2 (L) 08/24/2021   HCT 34.5 (L) 08/24/2021   PLT 133 (L) 08/24/2021   GLUCOSE 112 (H) 08/24/2021   ALT 87 (H) 08/24/2021   AST 90 (H) 08/24/2021   NA 136 08/24/2021   K 3.6 08/24/2021   CL 101 08/24/2021   CREATININE 0.97 08/24/2021   BUN 20 08/24/2021   CO2 25 08/24/2021   TSH 1.17 07/30/2020   INR 1.2 04/21/2021   HGBA1C 5.6 06/18/2015      08/27/2021    3:42 PM 06/04/2021   10:40 AM 04/03/2021   11:11 AM 05/26/2017   12:01 PM 05/13/2017   10:47 AM  Depression screen PHQ 2/9  Decreased Interest 2 0 2 2 0  Down, Depressed, Hopeless 2 0 2 1 0  PHQ - 2 Score 4 0 4 3 0   Altered sleeping _0 Tired, decreased energy _1 Change in appetite _2 Feeling bad or failure about yourself  _3 Trouble concentrating _4 Moving slowly or fidgety/restless 2  0 2   Suicidal thoughts 0  0 1   PHQ-9 Score _5 Difficult doing work/chores Very difficult  Very difficult      Assessment/Plan:  Dysphagia, unspecified type -Strep testing negative -Rx for Magic mouthwash printed out  - Plan: POC Rapid Strep A, Ambulatory referral to Gastroenterology, Magic mouthwash  Pyelonephritis -Improving -Complete course of abx, cefpodoxime -Continue drinking plenty of fluids -Given precautions for return of symptoms  Essential hypertension -Controlled/low normal -Patient encouraged to monitor BP at home -Hydration encouraged -Continue current meds  - Plan: amLODipine (NORVASC) 10 MG tablet  De Quervain thyroiditis -Continue supportive care -Continue follow-up with hand surgery  Elevated LFTs  -Alk phos 187, AST 90, ALT 87, T. bili 1.9 on 08/24/2021 in ED. -Discussed obtaining RUQ ultrasound -Will have patient follow-up with GI - Plan: US Abdomen Limited RUQ (LIVER/GB), Ambulatory referral to Gastroenterology  GAD (generalized anxiety disorder)  - Plan: sertraline (ZOLOFT) 25 MG tablet  Depression, major, single episode, moderate (HCC)  -PHQ-9 score 16 -Continue counseling -Continue Zoloft 25 mg daily.  Can consider increasing dose for continued symptoms. - Plan: sertraline (ZOLOFT) 25 MG tablet  F/u as needed in the next 2 months  Grier Mitts, MD

## 2021-08-28 ENCOUNTER — Other Ambulatory Visit: Payer: Self-pay | Admitting: Family Medicine

## 2021-08-28 ENCOUNTER — Other Ambulatory Visit: Payer: Self-pay | Admitting: *Deleted

## 2021-08-28 ENCOUNTER — Encounter: Payer: Self-pay | Admitting: Family Medicine

## 2021-08-28 ENCOUNTER — Encounter: Payer: Self-pay | Admitting: *Deleted

## 2021-08-28 ENCOUNTER — Telehealth: Payer: Self-pay

## 2021-08-28 DIAGNOSIS — F321 Major depressive disorder, single episode, moderate: Secondary | ICD-10-CM

## 2021-08-28 DIAGNOSIS — F411 Generalized anxiety disorder: Secondary | ICD-10-CM

## 2021-08-28 DIAGNOSIS — I10 Essential (primary) hypertension: Secondary | ICD-10-CM

## 2021-08-28 NOTE — Telephone Encounter (Signed)
Please get her a f/u also

## 2021-08-28 NOTE — Patient Outreach (Signed)
  Care Coordination   Initial Visit Note   08/28/2021 Name: Kristen [name] MRN: [id number] DOB: [birth year]  Kristen [name] is a 56 y.o. year old female who sees [name], MD for primary care. I spoke with  Edwyna Ready by phone today  What matters to the patients health and wellness today?  No needs    Goals Addressed               This Visit's Progress     No needs (pt-stated)        Care Coordination Interventions: Reviewed medications with patient and discussed Purpose of all medications Reviewed scheduled/upcoming provider appointments including pending appointments. Pt verified she had her AWV on 06/04/2021 and this information has been verified in EPIC Screening for signs and symptoms of depression related to chronic disease state  Assessed social determinant of health barriers          SDOH assessments and interventions completed:  Yes  SDOH Interventions Today    Flowsheet Row Most Recent Value  SDOH Interventions   Food Insecurity Interventions Other (Comment)  [referral to careguides]  Housing Interventions Intervention Not Indicated  Transportation Interventions SCAT ([employer])  [careguides will send application for SCAT]  Depression Interventions/Treatment  Medication, Currently on Treatment        Care Coordination Interventions Activated:  Yes  Care Coordination Interventions:  Yes, provided   Follow up plan: No further intervention required.   Encounter Outcome:  Pt. Visit Completed   Raina Mina, RN Care Management Coordinator [place] Office [phone]

## 2021-08-28 NOTE — Patient Instructions (Signed)
Visit Information  Thank you for taking time to visit with me today. Please don't hesitate to contact me if I can be of assistance to you.   Following are the goals we discussed today:   Goals Addressed               This Visit's Progress     No needs (pt-stated)        Care Coordination Interventions: Reviewed medications with patient and discussed Purpose of all medications Reviewed scheduled/upcoming provider appointments including pending appointments. Pt verified she had her AWV on 06/04/2021 and this information has been verified in EPIC Screening for signs and symptoms of depression related to chronic disease state  Assessed social determinant of health barriers           Please call the care guide team at [phone] if you need to cancel or reschedule your appointment.   If you are experiencing a Mental Health or [place] or need someone to talk to, please call the Suicide and Crisis Lifeline: 988  Patient verbalizes understanding of instructions and care plan provided today and agrees to view in [place]. Active MyChart status and patient understanding of how to access instructions and care plan via MyChart confirmed with patient.     No further follow up required: no needs  Raina Mina, RN Care Management Coordinator [place] Office [phone]

## 2021-08-28 NOTE — Telephone Encounter (Signed)
Post ED Visit - Positive Culture Follow-up  Culture report reviewed by antimicrobial stewardship pharmacist: [place] Team []  Elenor Quinones, Pharm.D. []  Heide Guile, Pharm.D., BCPS AQ-ID []  Parks Neptune, Pharm.D., BCPS []  Alycia Rossetti, Pharm.D., BCPS []  [place], Pharm.D., BCPS, AAHIVP []  Legrand Como, Pharm.D., BCPS, AAHIVP []  Salome Arnt, PharmD, BCPS []  Johnnette Gourd, PharmD, BCPS []  Hughes Better, PharmD, BCPS []  Leeroy Cha, PharmD []  Laqueta Linden, PharmD, BCPS []  Albertina Parr, PharmD  [place] Team [x]  Jimmy Footman, PharmD []  Lindell Spar, PharmD []  Royetta Asal, PharmD []  Graylin Shiver, Rph []  Rema Fendt) Glennon Mac, PharmD []  Arlyn Dunning, PharmD []  Netta Cedars, PharmD []  Dia Sitter, PharmD []  Leone [place], PharmD []  Gretta Arab, PharmD []  Theodis Shove, PharmD []  Peggyann Juba, PharmD []  Reuel Boom, PharmD   Positive urine culture Treated with Cefpodoxime Proxetil, organism sensitive to the same and no further patient follow-up is required at this time.  Glennon [name] 08/28/2021, 12:24 PM

## 2021-08-31 ENCOUNTER — Telehealth: Payer: Self-pay

## 2021-08-31 NOTE — Telephone Encounter (Signed)
   Telephone encounter was:  Successful.  08/31/2021 Name: Kristen [name] MRN: [id number] DOB: Jun 16, 1965  Kristen [name] is a 56 y.o. year old female who is a primary care patient of [name], MD . The community resource team was consulted for assistance with Transportation Needs  and [place] guide performed the following interventions: Spoke with patient, her daughter is providing transportation for her appointment tomorrow.  She is aware of her transportation benefits through [employer] and has the number to call and is aware of having to call 3 days in advance.  Verified her home address and I will mail a SCAT application and food pantry list..  Follow Up Plan:  Gave her my name and contact number to call is she does not receive the resource letter in the next 7-10 business days.  Alitza Cowman, AAS Paralegal, [facility]  [address], [place] [address] ??[email or web address]  ?? [phone]   [email or web address]

## 2021-08-31 NOTE — Telephone Encounter (Signed)
Called patient and scheduled f/u visit for October.

## 2021-09-01 ENCOUNTER — Other Ambulatory Visit: Payer: Medicare Other

## 2021-09-10 ENCOUNTER — Telehealth: Payer: Self-pay | Admitting: Nurse Practitioner

## 2021-09-10 NOTE — Telephone Encounter (Signed)
Patient called requesting to speak with a nurse regarding hiatal hernia.

## 2021-09-11 ENCOUNTER — Other Ambulatory Visit: Payer: Self-pay

## 2021-09-11 DIAGNOSIS — K219 Gastro-esophageal reflux disease without esophagitis: Secondary | ICD-10-CM

## 2021-09-11 MED ORDER — DEXILANT 60 MG PO CPDR: 60.0000 mg | DELAYED_RELEASE_CAPSULE | Freq: Every day | ORAL | 1 refills | Status: DC

## 2021-09-11 NOTE — Telephone Encounter (Signed)
Pt states that she is has a long history of having heart burn and Dr. Carlean Purl typically prescribes the [place] for pt: Pt stated that she only has 6 pills left: Pt has office visit scheduled for 10/29/2021: Prescription sent in to pharmacy for two months: Pt made aware  and notified to talk to Dr. Carlean Purl about refills at follow up appointment :  Pt verbalized understanding with all questions answered.

## 2021-09-15 ENCOUNTER — Encounter: Payer: Self-pay | Admitting: Family Medicine

## 2021-09-15 LAB — POCT RAPID STREP A (OFFICE): Rapid Strep A Screen: NEGATIVE

## 2021-09-20 ENCOUNTER — Encounter: Payer: Self-pay | Admitting: Family Medicine

## 2021-09-30 ENCOUNTER — Ambulatory Visit: Payer: Medicare Other | Admitting: Specialist

## 2021-10-09 DIAGNOSIS — Z1152 Encounter for screening for COVID-19: Secondary | ICD-10-CM | POA: Diagnosis not present

## 2021-10-10 NOTE — Progress Notes (Signed)
Office Visit Note   Patient: Kristen [name]           Date of Birth: Oct 02, 1965           MRN: [id number] Visit Date: 11/30/2018              Requested by: [name], MD [place],  [place] [id number] PCP: [name], MD   Assessment & Plan: Visit Diagnoses:  1. S/P lumbar fusion     Plan: Patient doing well.  Staples removed discharge is applied.  Follow-up Dr. Louanne Skye in 1 week for wound check.  Follow-Up Instructions: Return in about 1 week (around 12/07/2018) for WITH DR Thompsons. .   Orders:  No orders of the defined types were placed in this encounter.  Meds ordered this encounter  Medications   DISCONTD: cephALEXin (KEFLEX) 500 MG capsule    Sig: Take 1 capsule (500 mg total) by mouth 4 (four) times daily.    Dispense:  60 capsule    Refill:  0      Procedures: No procedures performed   Clinical Data: No additional findings.   Subjective: Chief Complaint  Patient presents with   Lower Back - Follow-up    HPI Patient is status post lumbar fusion November 06, 2018 returns for wound check.  States that she is doing well.  Not wearing brace today. Review of Systems   Objective: Vital Signs: LMP 11/15/2013   Physical Exam Staples removed and Steri-Strips applied.  Neurologic intact. Ortho Exam  Specialty Comments:  No specialty comments available.  Imaging: No results found.   PMFS History: Patient Active Problem List   Diagnosis Date Noted   Protein-calorie malnutrition, severe 04/29/2021   Pseudoarthrosis of lumbar spine 04/28/2021   Fusion of spine of lumbar region 04/28/2021   Pseudarthrosis after fusion or arthrodesis 04/20/2021    Class: Chronic   Tenosynovitis, de Quervain 12/14/2020   Arthritis of carpometacarpal (CMC) joint of left thumb 12/14/2020   Aortic atherosclerosis ([place]) 08/20/2020   Hyperkalemia 07/24/2020   Alcohol use 08/05/2019   Bilateral lower extremity edema 08/05/2019    Peripheral edema 03/12/2019   Iron deficiency anemia 12/11/2018   Loosening of hardware in spine [facility])    Other spondylosis with radiculopathy, lumbar region    History of lumbar spinal fusion 11/06/2018   UTI (urinary tract infection) 05/15/2017   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   Malignant neoplasm of base of tongue ([place]) 10/29/2016   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[place]) 10/29/2016   Headache associated with sexual activity 05/14/2016   Tongue lesion 05/14/2016   Thrombocytopenia ([place]) 01/22/2016   Chronic diarrhea 01/22/2016   Chest pain    Essential hypertension    Spinal stenosis of lumbar region 01/20/2016    Class: Chronic   DDD (degenerative disc disease), lumbar 01/20/2016    Class: Chronic   Spinal stenosis of lumbar region with neurogenic claudication 01/20/2016   Low back pain 12/22/2015   Hypersomnolence 10/13/2015   Muscle cramp 08/01/2015   GERD (gastroesophageal reflux disease) 07/09/2015   Morbid obesity with BMI of 40.0-44.9, adult ([place]) 01/08/2013   Past Medical History:  Diagnosis Date   Allergy    Anemia    Iron deficiency   Anxiety    Arthritis    back, left shoulder   Blood transfusion without reported diagnosis    Chicken pox    Depression    Diarrhea  takes Imodium daily   GERD (gastroesophageal reflux disease)    H/O hiatal hernia    Headache(784.0)    History of kidney stones    1996ish   History of radiation therapy 11/16/16- 01/01/17   Base of Tongue/ 42 gy in 33 fractions/ Dose: 2 Gy   Hypertension    Iron deficiency anemia 12/11/2018   Migraine    none for 5 years (as of 01/13/16)   OSA (obstructive sleep apnea) 10/13/2015   unable to get cpap, plans to get one in 2018   Pneumonia    Restless legs    Scoliosis    Shingles 08/28/2013   Shortness of breath    with exertion   Sleep apnea    Tongue cancer ([place])    tongue cancer    Family History  Problem Relation Age of Onset   Heart  disease Father    Heart attack Father    Hypertension Mother    Arthritis Mother    Diabetes Maternal Grandmother    Diabetes Paternal Grandmother    Diabetes Maternal Uncle    Colon polyps Neg Hx    Colon cancer Neg Hx    Esophageal cancer Neg Hx    Stomach cancer Neg Hx    Rectal cancer Neg Hx     Past Surgical History:  Procedure Laterality Date   BACK SURGERY  07/21/1978   CARPOMETACARPEL SUSPENSION PLASTY Left 01/21/2021   Procedure: Left Thumb Ligament Carpometacarpal Arthroplasty;  Surgeon: Sherilyn Cooter, MD;  Location: [facility];  Service: Orthopedics;  Laterality: Left;   CHOLECYSTECTOMY N/A 08/15/2013   Procedure: LAPAROSCOPIC CHOLECYSTECTOMY WITH INTRAOPERATIVE CHOLANGIOGRAM;  Surgeon: Gwenyth Ober, MD;  Location: [facility];  Service: General;  Laterality: N/A;   COLONOSCOPY N/A 01/09/2013   Procedure: COLONOSCOPY;  Surgeon: Beryle Beams, MD;  Location: [facility];  Service: Endoscopy;  Laterality: N/A;   COLONOSCOPY  03/2018   DIRECT LARYNGOSCOPY  07/2016   Dr. Nicolette Bang [facility]   ESOPHAGOGASTRODUODENOSCOPY  03/2018   ESOPHAGOGASTRODUODENOSCOPY (EGD) WITH PROPOFOL N/A 04/24/2019   Procedure: ESOPHAGOGASTRODUODENOSCOPY (EGD) WITH PROPOFOL;  Surgeon: Gatha Mayer, MD;  Location: [facility];  Service: Endoscopy;  Laterality: N/A;   GASTROSTOMY TUBE PLACEMENT  09/27/2016   GIVENS CAPSULE STUDY N/A 04/24/2019   Procedure: GIVENS CAPSULE STUDY;  Surgeon: Gatha Mayer, MD;  Location: [facility];  Service: Endoscopy;  Laterality: N/A;   HERNIA REPAIR Left 1981   IR PATIENT EVAL TECH 0-60 MINS  12/13/2016   IR PATIENT EVAL TECH 0-60 MINS  04/11/2017   IR PATIENT EVAL TECH 0-60 MINS  03/24/2018   IR PATIENT EVAL TECH 0-60 MINS  11/23/2018   IR PATIENT EVAL TECH 0-60 MINS  05/28/2019   IR REPLACE G-TUBE SIMPLE WO FLUORO  12/12/2017   IR REPLACE G-TUBE SIMPLE WO FLUORO  03/29/2018   IR REPLACE G-TUBE SIMPLE WO FLUORO  06/29/2018   IR REPLACE G-TUBE SIMPLE WO FLUORO  11/02/2018   IR  REPLACE G-TUBE SIMPLE WO FLUORO  03/08/2019   IR REPLACE G-TUBE SIMPLE WO FLUORO  07/02/2019   IR REPLACE G-TUBE SIMPLE WO FLUORO  11/30/2019   IR REPLACE G-TUBE SIMPLE WO FLUORO  12/27/2019   IR REPLACE G-TUBE SIMPLE WO FLUORO  05/27/2020   IR REPLACE G-TUBE SIMPLE WO FLUORO  07/09/2020   MODIFIED RADICAL NECK DISSECTION Left 09/27/2016   Levels 1 & 2   PARTIAL GLOSSECTOMY Left 09/27/2016   Left hemi partial glossectomy   SPINE SURGERY  01/20/2016  fusion   TONSILLECTOMY     tracheotomy  09/27/2016   TUBAL LIGATION  06/1988   Social History   Occupational History   Occupation: Air cabin crew  Tobacco Use   Smoking status: Never   Smokeless tobacco: Never  Vaping Use   Vaping Use: Never used  Substance and Sexual Activity   Alcohol use: Yes    Alcohol/week: 7.0 standard drinks of alcohol    Types: 7 Cans of beer per week    Comment: occasionally   Drug use: No   Sexual activity: Yes    Birth control/protection: Surgical

## 2021-10-11 NOTE — Progress Notes (Signed)
Office Visit Note   Patient: Kristen [name]           Date of Birth: 23-Nov-1965           MRN: [id number] Visit Date: 05/14/2021              Requested by: [name], MD [place],  [place] [id number] PCP: [name], MD   Assessment & Plan: Visit Diagnoses:  1. Spondylolisthesis of lumbar region   REMOVAL OF RODS AND SCREWS LUMBAR THREE TO SACRAL ONE, LUMBAR THREE-FOUR LUMBAR INTERBODY FUSION,  INSTRUMENTATION EXTENDED TO THOACIC TWELVE LEVEL, PEDICLE SCREWS, RODS, CAGE, LOCAL BONE GRAFT, ALLOGRAFT, BMP   Plan: Patient will continue wearing her brace.  Follow-up week for wound check and staple removal.  Follow-Up Instructions: Return in about 1 week (around 05/21/2021) for with Talor Cheema for wound check and staple removal.   Orders:  Orders Placed This Encounter  Procedures   XR Lumbar Spine 2-3 Views   No orders of the defined types were placed in this encounter.     Procedures: No procedures performed   Clinical Data: No additional findings.   Subjective: Chief Complaint  Patient presents with   Lower Back - Routine Post Op    HPI 56 year old female returns.  She is 2-week status post lumbar fusion.  States that she is doing well.  Preop leg pain improved.  Has been wearing her brace.    Objective: Vital Signs: BP 101/63   Pulse 74   Ht 5' 7"  (1.702 m)   Wt 151 lb 6.4 oz (68.7 kg)   LMP 11/15/2013   BMI 23.71 kg/m   Physical Exam Pleasant female alert and oriented in no acute stress.  Wound looks good.  Staples intact.  No drainage or signs infection. Ortho Exam  Specialty Comments:  No specialty comments available.  Imaging: No results found.   PMFS History: Patient Active Problem List   Diagnosis Date Noted   Protein-calorie malnutrition, severe 04/29/2021   Pseudoarthrosis of lumbar spine 04/28/2021   Fusion of spine of lumbar region 04/28/2021   Pseudarthrosis after fusion or arthrodesis 04/20/2021     Class: Chronic   Tenosynovitis, de Quervain 12/14/2020   Arthritis of carpometacarpal (CMC) joint of left thumb 12/14/2020   Aortic atherosclerosis ([place]) 08/20/2020   Hyperkalemia 07/24/2020   Alcohol use 08/05/2019   Bilateral lower extremity edema 08/05/2019   Peripheral edema 03/12/2019   Iron deficiency anemia 12/11/2018   Loosening of hardware in spine [facility])    Other spondylosis with radiculopathy, lumbar region    History of lumbar spinal fusion 11/06/2018   UTI (urinary tract infection) 05/15/2017   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   Malignant neoplasm of base of tongue ([place]) 10/29/2016   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[place]) 10/29/2016   Headache associated with sexual activity 05/14/2016   Tongue lesion 05/14/2016   Thrombocytopenia ([place]) 01/22/2016   Chronic diarrhea 01/22/2016   Chest pain    Essential hypertension    Spinal stenosis of lumbar region 01/20/2016    Class: Chronic   DDD (degenerative disc disease), lumbar 01/20/2016    Class: Chronic   Spinal stenosis of lumbar region with neurogenic claudication 01/20/2016   Low back pain 12/22/2015   Hypersomnolence 10/13/2015   Muscle cramp 08/01/2015   GERD (gastroesophageal reflux disease) 07/09/2015   Morbid obesity with BMI of 40.0-44.9, adult ([place]) 01/08/2013   Past Medical History:  Diagnosis  Date   Allergy    Anemia    Iron deficiency   Anxiety    Arthritis    back, left shoulder   Blood transfusion without reported diagnosis    Chicken pox    Depression    Diarrhea    takes Imodium daily   GERD (gastroesophageal reflux disease)    H/O hiatal hernia    Headache(784.0)    History of kidney stones    1996ish   History of radiation therapy 11/16/16- 01/01/17   Base of Tongue/ 55 gy in 33 fractions/ Dose: 2 Gy   Hypertension    Iron deficiency anemia 12/11/2018   Migraine    none for 5 years (as of 01/13/16)   OSA (obstructive sleep apnea) 10/13/2015   unable to  get cpap, plans to get one in 2018   Pneumonia    Restless legs    Scoliosis    Shingles 08/28/2013   Shortness of breath    with exertion   Sleep apnea    Tongue cancer ([place])    tongue cancer    Family History  Problem Relation Age of Onset   Heart disease Father    Heart attack Father    Hypertension Mother    Arthritis Mother    Diabetes Maternal Grandmother    Diabetes Paternal Grandmother    Diabetes Maternal Uncle    Colon polyps Neg Hx    Colon cancer Neg Hx    Esophageal cancer Neg Hx    Stomach cancer Neg Hx    Rectal cancer Neg Hx     Past Surgical History:  Procedure Laterality Date   BACK SURGERY  07/21/1978   CARPOMETACARPEL SUSPENSION PLASTY Left 01/21/2021   Procedure: Left Thumb Ligament Carpometacarpal Arthroplasty;  Surgeon: Sherilyn Cooter, MD;  Location: [facility];  Service: Orthopedics;  Laterality: Left;   CHOLECYSTECTOMY N/A 08/15/2013   Procedure: LAPAROSCOPIC CHOLECYSTECTOMY WITH INTRAOPERATIVE CHOLANGIOGRAM;  Surgeon: Gwenyth Ober, MD;  Location: [facility];  Service: General;  Laterality: N/A;   COLONOSCOPY N/A 01/09/2013   Procedure: COLONOSCOPY;  Surgeon: Beryle Beams, MD;  Location: [facility];  Service: Endoscopy;  Laterality: N/A;   COLONOSCOPY  03/2018   DIRECT LARYNGOSCOPY  07/2016   Dr. Nicolette Bang [facility]   ESOPHAGOGASTRODUODENOSCOPY  03/2018   ESOPHAGOGASTRODUODENOSCOPY (EGD) WITH PROPOFOL N/A 04/24/2019   Procedure: ESOPHAGOGASTRODUODENOSCOPY (EGD) WITH PROPOFOL;  Surgeon: Gatha Mayer, MD;  [facility];  Service: Endoscopy;  Laterality: N/A;   GASTROSTOMY TUBE PLACEMENT  09/27/2016   GIVENS CAPSULE STUDY N/A 04/24/2019   Procedure: GIVENS CAPSULE STUDY;  Surgeon: Gatha Mayer, MD;  [facility];  Service: Endoscopy;  Laterality: N/A;   HERNIA REPAIR Left 1981   IR PATIENT EVAL TECH 0-60 MINS  12/13/2016   IR PATIENT EVAL TECH 0-60 MINS  04/11/2017   IR PATIENT EVAL TECH 0-60 MINS  03/24/2018   IR PATIENT EVAL TECH 0-60 MINS   11/23/2018   IR PATIENT EVAL TECH 0-60 MINS  05/28/2019   IR REPLACE G-TUBE SIMPLE WO FLUORO  12/12/2017   IR REPLACE G-TUBE SIMPLE WO FLUORO  03/29/2018   IR REPLACE G-TUBE SIMPLE WO FLUORO  06/29/2018   IR REPLACE G-TUBE SIMPLE WO FLUORO  11/02/2018   IR REPLACE G-TUBE SIMPLE WO FLUORO  03/08/2019   IR REPLACE G-TUBE SIMPLE WO FLUORO  07/02/2019   IR REPLACE G-TUBE SIMPLE WO FLUORO  11/30/2019   IR REPLACE G-TUBE SIMPLE WO FLUORO  12/27/2019   IR REPLACE  G-TUBE SIMPLE WO FLUORO  05/27/2020   IR REPLACE G-TUBE SIMPLE WO FLUORO  07/09/2020   MODIFIED RADICAL NECK DISSECTION Left 09/27/2016   Levels 1 & 2   PARTIAL GLOSSECTOMY Left 09/27/2016   Left hemi partial glossectomy   SPINE SURGERY  01/20/2016   fusion   TONSILLECTOMY     tracheotomy  09/27/2016   TUBAL LIGATION  06/1988   Social History   Occupational History   Occupation: Air cabin crew  Tobacco Use   Smoking status: Never   Smokeless tobacco: Never  Vaping Use   Vaping Use: Never used  Substance and Sexual Activity   Alcohol use: Yes    Alcohol/week: 7.0 standard drinks of alcohol    Types: 7 Cans of beer per week    Comment: occasionally   Drug use: No   Sexual activity: Yes    Birth control/protection: Surgical

## 2021-10-12 ENCOUNTER — Encounter: Payer: Self-pay | Admitting: Specialist

## 2021-10-12 NOTE — Patient Instructions (Signed)
Plan: Follow-Up Instructions: Return in about 3 weeks (around 03/12/2020) for with dr Louanne Skye to review lumbar MRI

## 2021-10-14 ENCOUNTER — Telehealth: Payer: Self-pay | Admitting: Family Medicine

## 2021-10-14 NOTE — Telephone Encounter (Signed)
Pt is calling and needs order fax to [employer] for jevity nutrition supplement for her feeding tube and also syringes. Please fax to [phone]

## 2021-10-16 ENCOUNTER — Ambulatory Visit: Payer: Medicare Other | Admitting: Specialist

## 2021-10-19 ENCOUNTER — Other Ambulatory Visit: Payer: Self-pay

## 2021-10-19 DIAGNOSIS — Z1152 Encounter for screening for COVID-19: Secondary | ICD-10-CM | POA: Diagnosis not present

## 2021-10-20 ENCOUNTER — Encounter: Payer: Self-pay | Admitting: Specialist

## 2021-10-20 ENCOUNTER — Ambulatory Visit (INDEPENDENT_AMBULATORY_CARE_PROVIDER_SITE_OTHER): Payer: Medicare Other | Admitting: Specialist

## 2021-10-20 VITALS — BP 129/86 | HR 71 | Ht 67.0 in | Wt 165.4 lb

## 2021-10-20 DIAGNOSIS — M47812 Spondylosis without myelopathy or radiculopathy, cervical region: Secondary | ICD-10-CM

## 2021-10-20 DIAGNOSIS — Z981 Arthrodesis status: Secondary | ICD-10-CM | POA: Diagnosis not present

## 2021-10-20 DIAGNOSIS — M4036 Flatback syndrome, lumbar region: Secondary | ICD-10-CM | POA: Diagnosis not present

## 2021-10-20 DIAGNOSIS — M4325 Fusion of spine, thoracolumbar region: Secondary | ICD-10-CM

## 2021-10-20 DIAGNOSIS — M47814 Spondylosis without myelopathy or radiculopathy, thoracic region: Secondary | ICD-10-CM

## 2021-10-20 MED ORDER — NAPROXEN 125 MG/5ML PO SUSP: 250.0000 mg | Freq: Three times a day (TID) | ORAL | 3 refills | Status: DC

## 2021-10-20 NOTE — Progress Notes (Signed)
Office Visit Note   Patient: Kristen [name]           Date of Birth: [birth year]           MRN: [id number] Visit Date: 10/20/2021              Requested by: [name], MD [place],  [place] [address] PCP: [name], MD   Assessment & Plan: Visit Diagnoses:  1. S/P lumbar fusion   2. Fusion of spine of thoracolumbar region   3. Thoracic spondylosis   4. Flatback syndrome of lumbar region   5. Spondylosis without myelopathy or radiculopathy, cervical region     Plan: Avoid frequent bending and stooping  No lifting greater than 10 lbs. May use ice or moist heat for pain. Weight loss is of benefit. Exercise is important to improve your indurance and does allow people to function better inspite of back   Follow-Up Instructions: Return in about 6 months (around 04/21/2022) for Follow up with Dr. Laurance Flatten for long thoracolumbar fusion with spondylosis and flatback.   Orders:  No orders of the defined types were placed in this encounter.  No orders of the defined types were placed in this encounter.     Procedures: No procedures performed   Clinical Data: No additional findings.   Subjective: Chief Complaint  Patient presents with   Lower Back - Follow-up    56 year old female with history of degenerative scoliosis below thoracolumbar fusion done for idiopathic scoliosis.  No bowel and bladder difficulty. She has pain sometimes with standing and walking.     Review of Systems   Objective: Vital Signs: BP 129/86   Pulse 71   Ht 5' 7"  (1.702 m)   Wt 165 lb 6.4 oz (75 kg)   LMP 11/15/2013   BMI 25.91 kg/m   Physical Exam  Ortho Exam  Specialty Comments:  No specialty comments available.  Imaging: No results found.   PMFS History: Patient Active Problem List   Diagnosis Date Noted   Pseudarthrosis after fusion or arthrodesis 04/20/2021    Priority: High    Class: Chronic   Spinal stenosis of lumbar region  01/20/2016    Priority: High    Class: Chronic   DDD (degenerative disc disease), lumbar 01/20/2016    Priority: High    Class: Chronic   Protein-calorie malnutrition, severe 04/29/2021   Pseudoarthrosis of lumbar spine 04/28/2021   Fusion of spine of lumbar region 04/28/2021   Tenosynovitis, de Quervain 12/14/2020   Arthritis of carpometacarpal ([place]) joint of left thumb 12/14/2020   Aortic atherosclerosis ([place]) 08/20/2020   Hyperkalemia 07/24/2020   Alcohol use 08/05/2019   Bilateral lower extremity edema 08/05/2019   Peripheral edema 03/12/2019   Iron deficiency anemia 12/11/2018   Loosening of hardware in spine [facility])    Other spondylosis with radiculopathy, lumbar region    History of lumbar spinal fusion 11/06/2018   UTI (urinary tract infection) 05/15/2017   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   Malignant neoplasm of base of tongue ([place]) 10/29/2016   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[place]) 10/29/2016   Headache associated with sexual activity 05/14/2016   Tongue lesion 05/14/2016   Thrombocytopenia ([place]) 01/22/2016   Chronic diarrhea 01/22/2016   Chest pain    Essential hypertension    Spinal stenosis of lumbar region with neurogenic claudication 01/20/2016   Low back pain 12/22/2015   Hypersomnolence 10/13/2015  Muscle cramp 08/01/2015   GERD (gastroesophageal reflux disease) 07/09/2015   Morbid obesity with BMI of 40.0-44.9, adult ([place]) 01/08/2013   Past Medical History:  Diagnosis Date   Allergy    Anemia    Iron deficiency   Anxiety    Arthritis    back, left shoulder   Blood transfusion without reported diagnosis    Chicken pox    Depression    Diarrhea    takes Imodium daily   GERD (gastroesophageal reflux disease)    H/O hiatal hernia    Headache(784.0)    History of kidney stones    1996ish   History of radiation therapy 11/16/16- 01/01/17   Base of Tongue/ 36 gy in 33 fractions/ Dose: 2 Gy   Hypertension    Iron  deficiency anemia 12/11/2018   Migraine    none for 5 years (as of 01/13/16)   OSA (obstructive sleep apnea) 10/13/2015   unable to get cpap, plans to get one in 2018   Pneumonia    Restless legs    Scoliosis    Shingles 08/28/2013   Shortness of breath    with exertion   Sleep apnea    Tongue cancer ([place])    tongue cancer    Family History  Problem Relation Age of Onset   Heart disease Father    Heart attack Father    Hypertension Mother    Arthritis Mother    Diabetes Maternal Grandmother    Diabetes Paternal Grandmother    Diabetes Maternal Uncle    Colon polyps Neg Hx    Colon cancer Neg Hx    Esophageal cancer Neg Hx    Stomach cancer Neg Hx    Rectal cancer Neg Hx     Past Surgical History:  Procedure Laterality Date   BACK SURGERY  07/21/1978   CARPOMETACARPEL SUSPENSION PLASTY Left 01/21/2021   Procedure: Left Thumb Ligament Carpometacarpal Arthroplasty;  Surgeon: Sherilyn Cooter, MD;  Location: [facility];  Service: Orthopedics;  Laterality: Left;   CHOLECYSTECTOMY N/A 08/15/2013   Procedure: LAPAROSCOPIC CHOLECYSTECTOMY WITH INTRAOPERATIVE CHOLANGIOGRAM;  Surgeon: Gwenyth Ober, MD;  Location: [facility];  Service: General;  Laterality: N/A;   COLONOSCOPY N/A 01/09/2013   Procedure: COLONOSCOPY;  Surgeon: Beryle Beams, MD;  Location: [facility];  Service: Endoscopy;  Laterality: N/A;   COLONOSCOPY  03/2018   DIRECT LARYNGOSCOPY  07/2016   Dr. Nicolette Bang [facility]   ESOPHAGOGASTRODUODENOSCOPY  03/2018   ESOPHAGOGASTRODUODENOSCOPY (EGD) WITH PROPOFOL N/A 04/24/2019   Procedure: ESOPHAGOGASTRODUODENOSCOPY (EGD) WITH PROPOFOL;  Surgeon: Gatha Mayer, MD;  [facility];  Service: Endoscopy;  Laterality: N/A;   GASTROSTOMY TUBE PLACEMENT  09/27/2016   GIVENS CAPSULE STUDY N/A 04/24/2019   Procedure: GIVENS CAPSULE STUDY;  Surgeon: Gatha Mayer, MD;  [facility];  Service: Endoscopy;  Laterality: N/A;   HERNIA REPAIR Left 1981   IR PATIENT EVAL TECH 0-60 MINS   12/13/2016   IR PATIENT EVAL TECH 0-60 MINS  04/11/2017   IR PATIENT EVAL TECH 0-60 MINS  03/24/2018   IR PATIENT EVAL TECH 0-60 MINS  11/23/2018   IR PATIENT EVAL TECH 0-60 MINS  05/28/2019   IR REPLACE G-TUBE SIMPLE WO FLUORO  12/12/2017   IR REPLACE G-TUBE SIMPLE WO FLUORO  03/29/2018   IR REPLACE G-TUBE SIMPLE WO FLUORO  06/29/2018   IR REPLACE G-TUBE SIMPLE WO FLUORO  11/02/2018   IR REPLACE G-TUBE SIMPLE WO FLUORO  03/08/2019   IR REPLACE G-TUBE SIMPLE  WO FLUORO  07/02/2019   IR REPLACE G-TUBE SIMPLE WO FLUORO  11/30/2019   IR REPLACE G-TUBE SIMPLE WO FLUORO  12/27/2019   IR REPLACE G-TUBE SIMPLE WO FLUORO  05/27/2020   IR REPLACE G-TUBE SIMPLE WO FLUORO  07/09/2020   MODIFIED RADICAL NECK DISSECTION Left 09/27/2016   Levels 1 & 2   PARTIAL GLOSSECTOMY Left 09/27/2016   Left hemi partial glossectomy   SPINE SURGERY  01/20/2016   fusion   TONSILLECTOMY     tracheotomy  09/27/2016   TUBAL LIGATION  06/1988   Social History   Occupational History   Occupation: Air cabin crew  Tobacco Use   Smoking status: Never   Smokeless tobacco: Never  Vaping Use   Vaping Use: Never used  Substance and Sexual Activity   Alcohol use: Yes    Alcohol/week: 7.0 standard drinks of alcohol    Types: 7 Cans of beer per week    Comment: occasionally   Drug use: No   Sexual activity: Yes    Birth control/protection: Surgical

## 2021-10-22 ENCOUNTER — Other Ambulatory Visit (HOSPITAL_COMMUNITY): Payer: Self-pay | Admitting: Radiology

## 2021-10-22 DIAGNOSIS — R633 Feeding difficulties, unspecified: Secondary | ICD-10-CM

## 2021-10-23 ENCOUNTER — Ambulatory Visit (HOSPITAL_COMMUNITY)
Admission: RE | Admit: 2021-10-23 | Discharge: 2021-10-23 | Disposition: A | Discharge status: Home / Self Care | Payer: Medicare Other | Source: Ambulatory Visit | Attending: Radiology | Admitting: Radiology

## 2021-10-23 DIAGNOSIS — C760 Malignant neoplasm of head, face and neck: Secondary | ICD-10-CM | POA: Insufficient documentation

## 2021-10-23 DIAGNOSIS — Z431 Encounter for attention to gastrostomy: Secondary | ICD-10-CM | POA: Diagnosis not present

## 2021-10-23 DIAGNOSIS — R633 Feeding difficulties, unspecified: Secondary | ICD-10-CM | POA: Insufficient documentation

## 2021-10-23 HISTORY — PX: IR REPLACE G-TUBE SIMPLE WO FLUORO: IMG2323

## 2021-10-23 MED ORDER — LIDOCAINE VISCOUS HCL 2 % MT SOLN
OROMUCOSAL | Status: AC
  Filled 2021-10-23: qty 15

## 2021-10-23 MED ORDER — SILVER NITRATE-POT NITRATE 75-25 % EX MISC
CUTANEOUS | Status: AC
  Filled 2021-10-23: qty 10

## 2021-10-23 NOTE — Telephone Encounter (Signed)
Pt is calling checking on the below inf. Pt said dr banks take care of her supplement she has enough to last 4 days

## 2021-10-23 NOTE — Procedures (Signed)
Pt's 18 fr balloon retention gastrostomy tube was exchanged for new 18 fr balloon retention tube without immediate complications. 7 cc saline was instilled into balloon. Tube secured to skin site. Silver nitrate application was also performed at insertion site granulation tissue. EBL none.

## 2021-10-27 DIAGNOSIS — Z1152 Encounter for screening for COVID-19: Secondary | ICD-10-CM | POA: Diagnosis not present

## 2021-10-28 ENCOUNTER — Telehealth: Payer: Self-pay | Admitting: Specialist

## 2021-10-28 NOTE — Telephone Encounter (Signed)
Pt called requesting a refill of supplement for her feeding tube to [name]. Pt states Dr Louanne Skye sent in script for her before. Pt also asking for a call from [facility]. Please call pt at [phone]. Pt states she on her last 2 days of supplement

## 2021-10-28 NOTE — Telephone Encounter (Signed)
I called the patient and ask her who give her nutrition supplement she said her back doctor I told her to call him

## 2021-10-28 NOTE — Telephone Encounter (Signed)
Pt is calling to follow up and needs this resolved as soon as possible.

## 2021-10-29 ENCOUNTER — Inpatient Hospital Stay: Payer: Medicare Other | Attending: Adult Health | Admitting: Adult Health

## 2021-10-29 ENCOUNTER — Encounter: Payer: Self-pay | Admitting: Internal Medicine

## 2021-10-29 ENCOUNTER — Ambulatory Visit (INDEPENDENT_AMBULATORY_CARE_PROVIDER_SITE_OTHER): Payer: Medicare Other | Admitting: Internal Medicine

## 2021-10-29 VITALS — BP 90/70 | HR 85 | Ht 67.0 in | Wt 159.5 lb

## 2021-10-29 VITALS — BP 102/72 | HR 84 | Temp 97.9°F | Resp 18 | Ht 67.0 in | Wt 159.4 lb

## 2021-10-29 DIAGNOSIS — E46 Unspecified protein-calorie malnutrition: Secondary | ICD-10-CM | POA: Insufficient documentation

## 2021-10-29 DIAGNOSIS — K529 Noninfective gastroenteritis and colitis, unspecified: Secondary | ICD-10-CM | POA: Diagnosis not present

## 2021-10-29 DIAGNOSIS — Z6841 Body Mass Index (BMI) 40.0 and over, adult: Secondary | ICD-10-CM | POA: Diagnosis not present

## 2021-10-29 DIAGNOSIS — Z9049 Acquired absence of other specified parts of digestive tract: Secondary | ICD-10-CM | POA: Insufficient documentation

## 2021-10-29 DIAGNOSIS — C0680 Malignant neoplasm of overlapping sites of unspecified parts of mouth: Secondary | ICD-10-CM | POA: Diagnosis not present

## 2021-10-29 DIAGNOSIS — C760 Malignant neoplasm of head, face and neck: Secondary | ICD-10-CM

## 2021-10-29 DIAGNOSIS — Z833 Family history of diabetes mellitus: Secondary | ICD-10-CM | POA: Diagnosis not present

## 2021-10-29 DIAGNOSIS — I10 Essential (primary) hypertension: Secondary | ICD-10-CM | POA: Insufficient documentation

## 2021-10-29 DIAGNOSIS — K219 Gastro-esophageal reflux disease without esophagitis: Secondary | ICD-10-CM | POA: Insufficient documentation

## 2021-10-29 DIAGNOSIS — Z5986 Financial insecurity: Secondary | ICD-10-CM | POA: Diagnosis not present

## 2021-10-29 DIAGNOSIS — C01 Malignant neoplasm of base of tongue: Secondary | ICD-10-CM | POA: Diagnosis not present

## 2021-10-29 DIAGNOSIS — Z87442 Personal history of urinary calculi: Secondary | ICD-10-CM | POA: Insufficient documentation

## 2021-10-29 DIAGNOSIS — C0689 Malignant neoplasm of overlapping sites of other parts of mouth: Secondary | ICD-10-CM | POA: Diagnosis not present

## 2021-10-29 DIAGNOSIS — I700 Atherosclerosis of aorta: Secondary | ICD-10-CM | POA: Diagnosis not present

## 2021-10-29 DIAGNOSIS — Z7289 Other problems related to lifestyle: Secondary | ICD-10-CM | POA: Diagnosis not present

## 2021-10-29 DIAGNOSIS — E43 Unspecified severe protein-calorie malnutrition: Secondary | ICD-10-CM | POA: Diagnosis not present

## 2021-10-29 DIAGNOSIS — Z8261 Family history of arthritis: Secondary | ICD-10-CM | POA: Insufficient documentation

## 2021-10-29 DIAGNOSIS — Z8249 Family history of ischemic heart disease and other diseases of the circulatory system: Secondary | ICD-10-CM | POA: Diagnosis not present

## 2021-10-29 DIAGNOSIS — Z79899 Other long term (current) drug therapy: Secondary | ICD-10-CM | POA: Diagnosis not present

## 2021-10-29 DIAGNOSIS — M1812 Unilateral primary osteoarthritis of first carpometacarpal joint, left hand: Secondary | ICD-10-CM | POA: Diagnosis not present

## 2021-10-29 MED ORDER — DEXILANT 60 MG PO CPDR: 60.0000 mg | DELAYED_RELEASE_CAPSULE | Freq: Every day | ORAL | 3 refills | Status: DC

## 2021-10-29 NOTE — Progress Notes (Signed)
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 ([place]) Staging form: Pharynx - P16 Negative Oropharynx, AJCC 8th Edition - Clinical: No stage assigned - Unsigned  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[name]) Staging form: Oral Cavity, AJCC 8th Edition - Clinical: No stage assigned - Unsigned - Pathologic: Stage IVA (pT4a, pN0, cM0) - Signed by Eppie Gibson, MD on 10/29/2016 Residual tumor (R): R0 - None Lymph-vascular invasion (LVI): LVI not present (absent)/not identified Perineural invasion (PNI): Present  Malignant neoplasm of base of tongue ([name]) Staging form: Pharynx - P16 Negative Oropharynx, AJCC 8th Edition - Clinical: No stage assigned - Unsigned   SUMMARY OF ONCOLOGIC HISTORY: Oncology History  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[name])  09/27/2016 Surgery   Dr. Nicolette Bang performed left hemiglossectomy, resection of base of tongue cancer, pharyngoplasty, left selective neck dissection, levels I and II, tracheotomy, and G tube placement. She has questionable margins that are technically negative.   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 14 lymph nodes from the left level II neck, 2 nodes from the left submandibular region, and 4 nodes from the submental region were negative for malignancy. Carcinoma was present focally at the lateral edge of the left posterior tongue, 4.6 cm in greatest extent. The lateral margin was sent separately as specimen B, which was negative for carcinoma. Perineural invasion was appreciated but lymphovascular invasion was not identified.   10/29/2016 Initial Diagnosis   Carcinoma of contiguous sites of mouth [facility])   11/16/2016 - 01/01/2017 Radiation Therapy   Site/dose:   Base of tongue tumor bed/ 66 Gy  in 33 fractions      CURRENT THERAPY: observation  INTERVAL HISTORY: Kristen [name] 56 y.o. female returns for follow-up of her head and neck cancer.  She denies any new concerns today.  She is edentulous and denies any changes in her swallowing difficulty.  She tells me that her weight fluctuates and she denies any difficulty tolerating soft foods and liquids.  She denies any new pain anywhere.  She is not experiencing any neck lymphedema.  She is up to date with her cancer screenings.  She has f/u with ENT next month.     Patient Active Problem List   Diagnosis Date Noted   Protein-calorie malnutrition, severe 04/29/2021   Pseudoarthrosis of lumbar spine 04/28/2021   Fusion of spine of lumbar region 04/28/2021   Pseudarthrosis after fusion or arthrodesis 04/20/2021    Class: Chronic   Tenosynovitis, de Quervain 12/14/2020   Arthritis of carpometacarpal (CMC) joint of left thumb 12/14/2020   Aortic atherosclerosis ([place]) 08/20/2020   Hyperkalemia 07/24/2020   Alcohol use 08/05/2019   Bilateral lower extremity edema 08/05/2019   Peripheral edema 03/12/2019   Iron deficiency anemia 12/11/2018   Loosening of hardware in spine [facility])    Other spondylosis with radiculopathy, lumbar region    History of lumbar spinal fusion 11/06/2018   UTI (urinary tract infection) 05/15/2017   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 ([name]) 10/29/2016   Malignant neoplasm of base of tongue ([name]) 10/29/2016   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[name]) 10/29/2016   Headache associated with sexual activity 05/14/2016   Tongue lesion 05/14/2016   Thrombocytopenia ([place]) 01/22/2016   Chronic diarrhea 01/22/2016   Chest pain  Essential hypertension    Spinal stenosis of lumbar region 01/20/2016    Class: Chronic   DDD (degenerative disc disease), lumbar 01/20/2016    Class: Chronic   Spinal stenosis of lumbar region with neurogenic claudication 01/20/2016   Low back pain 12/22/2015    Hypersomnolence 10/13/2015   Muscle cramp 08/01/2015   GERD (gastroesophageal reflux disease) 07/09/2015   Morbid obesity with BMI of 40.0-44.9, adult ([name]) 01/08/2013    is allergic to other and sulfa antibiotics.  MEDICAL HISTORY: Past Medical History:  Diagnosis Date   Allergy    Anemia    Iron deficiency   Anxiety    Arthritis    back, left shoulder   Blood transfusion without reported diagnosis    Chicken pox    Depression    Diarrhea    takes Imodium daily   GERD (gastroesophageal reflux disease)    H/O hiatal hernia    Headache(784.0)    History of kidney stones    1996ish   History of radiation therapy 11/16/16- 01/01/17   Base of Tongue/ 66 gy in 33 fractions/ Dose: 2 Gy   Hypertension    Iron deficiency anemia 12/11/2018   Migraine    none for 5 years (as of 01/13/16)   OSA (obstructive sleep apnea) 10/13/2015   unable to get cpap, plans to get one in 2018   Pneumonia    Restless legs    Scoliosis    Shingles 08/28/2013   Shortness of breath    with exertion   Sleep apnea    Tongue cancer ([facility]en [name])    tongue cancer    SURGICAL HISTORY: Past Surgical History:  Procedure Laterality Date   BACK SURGERY  07/21/1978   CARPOMETACARPEL SUSPENSION PLASTY Left 01/21/2021   Procedure: Left Thumb Ligament Carpometacarpal Arthroplasty;  Surgeon: Sherilyn Cooter, MD;  Location: [name];  Service: Orthopedics;  Laterality: Left;   CHOLECYSTECTOMY N/A 08/15/2013   Procedure: LAPAROSCOPIC CHOLECYSTECTOMY WITH INTRAOPERATIVE CHOLANGIOGRAM;  Surgeon: Gwenyth Ober, MD;  Location: [name];  Service: General;  Laterality: N/A;   COLONOSCOPY N/A 01/09/2013   Procedure: COLONOSCOPY;  Surgeon: Beryle Beams, MD;  Location: [name];  Service: Endoscopy;  Laterality: N/A;   COLONOSCOPY  03/2018   DIRECT LARYNGOSCOPY  07/2016   Dr. Nicolette Bang [facility]   ESOPHAGOGASTRODUODENOSCOPY  03/2018   ESOPHAGOGASTRODUODENOSCOPY (EGD) WITH PROPOFOL N/A 04/24/2019   Procedure:  ESOPHAGOGASTRODUODENOSCOPY (EGD) WITH PROPOFOL;  Surgeon: Gatha Mayer, MD;  [facility];  Service: Endoscopy;  Laterality: N/A;   GASTROSTOMY TUBE PLACEMENT  09/27/2016   GIVENS CAPSULE STUDY N/A 04/24/2019   Procedure: GIVENS CAPSULE STUDY;  Surgeon: Gatha Mayer, MD;  [facility];  Service: Endoscopy;  Laterality: N/A;   HERNIA REPAIR Left 1981   IR PATIENT EVAL TECH 0-60 MINS  12/13/2016   IR PATIENT EVAL TECH 0-60 MINS  04/11/2017   IR PATIENT EVAL TECH 0-60 MINS  03/24/2018   IR PATIENT EVAL TECH 0-60 MINS  11/23/2018   IR PATIENT EVAL TECH 0-60 MINS  05/28/2019   IR REPLACE G-TUBE SIMPLE WO FLUORO  12/12/2017   IR REPLACE G-TUBE SIMPLE WO FLUORO  03/29/2018   IR REPLACE G-TUBE SIMPLE WO FLUORO  06/29/2018   IR REPLACE G-TUBE SIMPLE WO FLUORO  11/02/2018   IR REPLACE G-TUBE SIMPLE WO FLUORO  03/08/2019   IR REPLACE G-TUBE SIMPLE WO FLUORO  07/02/2019   IR REPLACE G-TUBE SIMPLE WO FLUORO  11/30/2019   IR REPLACE G-TUBE SIMPLE WO  FLUORO  12/27/2019   IR REPLACE G-TUBE SIMPLE WO FLUORO  05/27/2020   IR REPLACE G-TUBE SIMPLE WO FLUORO  07/09/2020   IR REPLACE G-TUBE SIMPLE WO FLUORO  10/23/2021   MODIFIED RADICAL NECK DISSECTION Left 09/27/2016   Levels 1 & 2   PARTIAL GLOSSECTOMY Left 09/27/2016   Left hemi partial glossectomy   SPINE SURGERY  01/20/2016   fusion   TONSILLECTOMY     tracheotomy  09/27/2016   TUBAL LIGATION  06/1988    SOCIAL HISTORY: Social History   Socioeconomic History   Marital status: Widowed    Spouse name: Not on file   Number of children: 3   Years of education: 33   Highest education level: 12th grade  Occupational History   Occupation: Air cabin crew  Tobacco Use   Smoking status: Never   Smokeless tobacco: Never  Vaping Use   Vaping Use: Never used  Substance and Sexual Activity   Alcohol use: Yes    Alcohol/week: 7.0 standard drinks of alcohol    Types: 7 Cans of beer per week    Comment: occasionally   Drug use: No    Sexual activity: Yes    Birth control/protection: Surgical  Other Topics Concern   Not on file  Social History Narrative   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[employer], 3 children   Never smoker no drug use occasional alcohol averaging about 7 drinks a week   fun: Play with her grandkids, read   Social Determinants of Health   Financial Resource Strain: Medium Risk (06/04/2021)   Overall Financial Resource Strain (CARDIA)    Difficulty of Paying Living Expenses: Somewhat hard  Food Insecurity: Food Insecurity Present (08/31/2021)   Hunger Vital Sign    Worried About Running Out of Food in the Last Year: Often true    Ran Out of Food in the Last Year: Often true  Transportation Needs: Unmet Transportation Needs (08/31/2021)   PRAPARE - Transportation    Lack of Transportation (Medical): Yes    Lack of Transportation (Non-Medical): Yes  Physical Activity: Insufficiently Active (06/04/2021)   Exercise Vital Sign    Days of Exercise per Week: 3 days    Minutes of Exercise per Session: 30 min  Stress: No Stress Concern Present (06/04/2021)   [place]    Feeling of Stress : Only a little  Recent Concern: Stress - Stress Concern Present (03/31/2021)   [place]    Feeling of Stress : To some extent  Social Connections: Moderately Integrated (06/04/2021)   Social Connection and Isolation Panel [NHANES]    Frequency of Communication with Friends and Family: More than three times a week    Frequency of Social Gatherings with Friends and Family: More than three times a week    Attends Religious Services: More than 4 times per year    Active Member of [employer] or Organizations: Yes    Attends Archivist Meetings: More than 4 times per year    Marital Status: Widowed  Recent Concern: Social Connections - Moderately Isolated (03/31/2021)    Social Connection and Isolation Panel [NHANES]    Frequency of Communication with Friends and Family: More than three times a week    Frequency of Social Gatherings with Friends and Family: More than three times a week    Attends Religious Services: More than 4 times per  year    Active Member of Clubs or Organizations: No    Attends Archivist Meetings: Not on file    Marital Status: Widowed  Intimate Partner Violence: Not At Risk (08/28/2021)   Humiliation, Afraid, Rape, and Kick questionnaire    Fear of Current or Ex-Partner: No    Emotionally Abused: No    Physically Abused: No    Sexually Abused: No    FAMILY HISTORY: Family History  Problem Relation Age of Onset   Heart disease Father    Heart attack Father    Hypertension Mother    Arthritis Mother    Diabetes Maternal Grandmother    Diabetes Paternal Grandmother    Diabetes Maternal Uncle    Colon polyps Neg Hx    Colon cancer Neg Hx    Esophageal cancer Neg Hx    Stomach cancer Neg Hx    Rectal cancer Neg Hx     Review of Systems  Constitutional:  Negative for appetite change, chills, fatigue, fever and unexpected weight change.  HENT:   Negative for hearing loss, lump/mass and trouble swallowing.   Eyes:  Negative for eye problems and icterus.  Respiratory:  Negative for chest tightness, cough and shortness of breath.   Cardiovascular:  Negative for chest pain, leg swelling and palpitations.  Gastrointestinal:  Negative for abdominal distention, abdominal pain, constipation, diarrhea, nausea and vomiting.  Endocrine: Negative for hot flashes.  Genitourinary:  Negative for difficulty urinating.   Musculoskeletal:  Negative for arthralgias.  Skin:  Negative for itching and rash.  Neurological:  Negative for dizziness, extremity weakness, headaches and numbness.  Hematological:  Negative for adenopathy. Does not bruise/bleed easily.  Psychiatric/Behavioral:  Negative for depression. The patient is not  nervous/anxious.       PHYSICAL EXAMINATION  ECOG PERFORMANCE STATUS: 0 - Asymptomatic  Vitals:   10/29/21 1344  BP: 102/72  Pulse: 84  Resp: 18  Temp: 97.9 F (36.6 C)  SpO2: 97%    Physical Exam Constitutional:      General: She is not in acute distress.    Appearance: Normal appearance. She is not toxic-appearing.  HENT:     Head: Normocephalic and atraumatic.  Eyes:     General: No scleral icterus. Cardiovascular:     Rate and Rhythm: Normal rate and regular rhythm.     Pulses: Normal pulses.     Heart sounds: Normal heart sounds.  Pulmonary:     Effort: Pulmonary effort is normal.     Breath sounds: Normal breath sounds.  Abdominal:     General: Abdomen is flat. Bowel sounds are normal. There is no distension.     Palpations: Abdomen is soft.     Tenderness: There is no abdominal tenderness.  Musculoskeletal:        General: No swelling.     Cervical back: Neck supple.  Lymphadenopathy:     Cervical: No cervical adenopathy.  Skin:    General: Skin is warm and dry.     Findings: No rash.  Neurological:     General: No focal deficit present.     Mental Status: She is alert.  Psychiatric:        Mood and Affect: Mood normal.        Behavior: Behavior normal.     LABORATORY DATA: None for this visit.  ASSESSMENT and THERAPY PLAN:   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[place]) Kristen [name] is a 55 year old woman with h/o head and neck  cancer s/p surgery and radiation here today for long term follow up of her cancer.    Kimila has no clinical signs of head and neck cancer recurrence.  She and I discussed healthy diet and exercise.  She will let us know if her swallowing worsens and I will refer her to see our SLP Garald Balding.  I recommended that she keep her f/u with ENT as scheduled, as well as her routine cancer screenings--pap smears, mammogram, colon cancer screening, skin screening, and regular PCP visits.    We will see Anali back in one year  for continued long term follow-up.    All questions were answered. The patient knows to call the clinic with any problems, questions or concerns. We can certainly see the patient much sooner if necessary.  Total encounter time:20 minutes*in face-to-face visit time, chart review, lab review, care coordination, order entry, and documentation of the encounter time.  Wilber Bihari, NP 11/01/21 4:57 PM Medical Oncology and Hematology [facility] [place], [place] [address] Tel. [phone]    Fax. [phone]  *Total Encounter Time as defined by the Centers for Medicare and Medicaid Services includes, in addition to the face-to-face time of a patient visit (documented in the note above) non-face-to-face time: obtaining and reviewing outside history, ordering and reviewing medications, tests or procedures, care coordination (communications with other health care professionals or caregivers) and documentation in the medical record.

## 2021-10-29 NOTE — Progress Notes (Signed)
Kristen [name] 56 y.o. 01-08-1966 [id number]  Assessment & Plan:   Encounter Diagnoses  Name Primary?   Chronic diarrhea Yes   Gastroesophageal reflux disease, unspecified whether esophagitis present    Protein-calorie malnutrition, severe    Dexilant refilled. Will refill Lomotil as needed she knows to call or have the pharmacy call.  This controls her diarrhea and is well-tolerated. I offered to fill her Jevity prescription but it sounds like her new orthopedist who will take Dr. Otho Ket place is going to do it for her. Return 1 year routinely sooner if needed   Subjective:   Chief Complaint: Chronic diarrhea and GERD follow-up  HPI 56 year old African-American woman with a history of chronic idiopathic diarrhea, gastrostomy tube dependent status post tongue cancer, iron deficiency anemia and GERD who is here for follow-up, last seen 2021. GERd controlled on Dexilant - needs erfill. Lomotil controls diarrhea. Disabled now and s/p multiple back surgeries. Had lost sig weight and had malnutrition - on Jevity per Dr. Louanne Skye. He is retiring and she needs someone to pick up the Rx - PCP declined.    Wt Readings from Last 3 Encounters:  10/29/21 159 lb 8 oz (72.3 kg)  10/20/21 165 lb 6.4 oz (75 kg)  08/27/21 165 lb 6.4 oz (75 kg)     CT abdomen and pelvis 07/24/2020 IMPRESSION: 1. Scattered colonic diverticulosis with no acute diverticulitis. 2. Nonobstructive 5 mm left nephrolithiasis. 3. Hepatic steatosis. 4. Gastrostomy tube in appropriate position. 5.  Aortic Atherosclerosis (ICD10-I70.0). CT enterography 03/22/2019 IMPRESSION: 1. No findings of active Crohn's disease or active mucosal/transmural inflammation. Mild wall thickening in the proximal sigmoid colon is probably primarily due to nondistention. There is also descending colon diverticulosis. 2. Stable appearance of considerable bilateral renal scarring and underlying renal atrophy. 3. Nonobstructive left  nephrolithiasis. 4. Mild cardiomegaly. 5. Borderline enlarged bilateral inguinal lymph nodes, probably reactive. 6. The left L4 pedicle screw no longer extends in the pedicle but rather extends into the L3-4 intervertebral disc space. Aortic atherosclerosis   Capsule endoscopy small bowel 04/25/2019-innocent appearing small bowel polyp otherwise normal  EGD 04/24/2019 normal status post gastrostomy     Colonoscopy 04/10/2018  - Decreased sphincter tone found on digital rectal exam. - Erythematous and eroded mucosa in the distal sigmoid colon. Biopsied. - Diverticulosis in the left colon. - The examination was otherwise normal on direct and retroflexion views. - Biopsies were taken with a cold forceps from the right colon and left colon for evaluation of microscopic colitis. Looping would not allow entry of terminal ileum -Cecum had gritty debris that I could not clear but remainder of colon exam was actually good  Allergies  Allergen Reactions   Other Hives and Swelling    tomato   Sulfa Antibiotics Hives   Current Meds  Medication Sig   amLODipine (NORVASC) 10 MG tablet Take 1 tablet (10 mg total) by mouth daily.   aspirin 81 MG chewable tablet Place 81 mg into feeding tube daily.    baclofen (LIORESAL) 10 MG tablet TAKE 1 TABLET(10 MG) BY MOUTH THREE TIMES DAILY   Calcium Citrate 200 MG TABS Place 200 mg into feeding tube daily at 12 noon.   cefpodoxime (VANTIN) 200 MG tablet Take 1 tablet (200 mg total) by mouth 2 (two) times daily.   Cholecalciferol (VITAMIN D3) 10 MCG (400 UNIT) tablet Take 400 Units by mouth daily.   clotrimazole (MYCELEX) 10 MG troche Take 1 tablet (10 mg total) by mouth 3 (three) times  daily as needed (infection).   DEXILANT 60 MG capsule Place 1 capsule (60 mg total) into feeding tube daily.   diphenoxylate-atropine (LOMOTIL) 2.5-0.025 MG tablet TAKE 2 TABLETS VIA FEEDING TUBE TWICE DAILY   ferrous sulfate 220 (44 Fe) MG/5ML solution PLACE 6.8MLS(300MG)  INTO FEEDING TUBE DAILY   folic acid (FOLVITE) 662 MCG tablet Place 400 mcg into feeding tube daily.   lidocaine (LMX) 4 % cream Apply 1 application. topically as needed (pain).   magic mouthwash SOLN 1 part diphenhydramine 12.5/5 mL, 1 for dexamethasone 0.5 mg / 5 mL, 1 part nystatin oral suspension. Swish and spit 2 teaspoons every 4-6 hours.   methocarbamol (ROBAXIN) 500 MG tablet Place 1 tablet (500 mg total) into feeding tube every 6 (six) hours as needed for muscle spasms.   naproxen (NAPROSYN) 125 MG/5ML suspension Take 10 mLs (250 mg total) by mouth 3 (three) times daily with meals.   ondansetron (ZOFRAN-ODT) 4 MG disintegrating tablet Take 1 tablet (4 mg total) by mouth every 8 (eight) hours as needed for nausea or vomiting.   oxyCODONE (ROXICODONE) 5 MG/5ML solution Place 7.5 mLs (7.5 mg total) into feeding tube every 4 (four) hours as needed for moderate pain or severe pain ((score 4-6)).   potassium chloride SA (KLOR-CON) 20 MEQ tablet TAKE 1 TABLET BY MOUTH TWICE DAILY FOR THE NEXT 3 DAYS. OKAY TO CRUSH IF UNABLE TO SWALLOW. CAN ALSO TAKE PER TUBE IF NEEDED. (Patient taking differently: 20 mEq at bedtime. OKAY TO CRUSH IF UNABLE TO SWALLOW. CAN ALSO TAKE PER TUBE IF NEEDED.)   pregabalin (LYRICA) 75 MG capsule TAKE 1 CAPSULE(75 MG) BY MOUTH TWICE DAILY   sertraline (ZOLOFT) 25 MG tablet TAKE 1 TABLET(25 MG) BY MOUTH DAILY   spironolactone (ALDACTONE) 25 MG tablet Take 25 mg by mouth 2 (two) times daily.   Past Medical History:  Diagnosis Date   Allergy    Anemia    Iron deficiency   Anxiety    Arthritis    back, left shoulder   Blood transfusion without reported diagnosis    Chicken pox    Depression    Diarrhea    takes Imodium daily   GERD (gastroesophageal reflux disease)    H/O hiatal hernia    Headache(784.0)    History of kidney stones    1996ish   History of radiation therapy 11/16/16- 01/01/17   Base of Tongue/ 66 gy in 33 fractions/ Dose: 2 Gy   Hypertension     Iron deficiency anemia 12/11/2018   Migraine    none for 5 years (as of 01/13/16)   OSA (obstructive sleep apnea) 10/13/2015   unable to get cpap, plans to get one in 2018   Pneumonia    Restless legs    Scoliosis    Shingles 08/28/2013   Shortness of breath    with exertion   Sleep apnea    Tongue cancer ([place])    tongue cancer   Past Surgical History:  Procedure Laterality Date   BACK SURGERY  07/21/1978   CARPOMETACARPEL SUSPENSION PLASTY Left 01/21/2021   Procedure: Left Thumb Ligament Carpometacarpal Arthroplasty;  Surgeon: Sherilyn Cooter, MD;  Location: [place];  Service: Orthopedics;  Laterality: Left;   CHOLECYSTECTOMY N/A 08/15/2013   Procedure: LAPAROSCOPIC CHOLECYSTECTOMY WITH INTRAOPERATIVE CHOLANGIOGRAM;  Surgeon: Gwenyth Ober, MD;  Location: [place];  Service: General;  Laterality: N/A;   COLONOSCOPY N/A 01/09/2013   Procedure: COLONOSCOPY;  Surgeon: Beryle Beams, MD;  Location: [place];  Service: Endoscopy;  Laterality: N/A;   COLONOSCOPY  03/2018   DIRECT LARYNGOSCOPY  07/2016   Dr. Nicolette Bang [facility]   ESOPHAGOGASTRODUODENOSCOPY  03/2018   ESOPHAGOGASTRODUODENOSCOPY (EGD) WITH PROPOFOL N/A 04/24/2019   Procedure: ESOPHAGOGASTRODUODENOSCOPY (EGD) WITH PROPOFOL;  Surgeon: Gatha Mayer, MD;  [facility];  Service: Endoscopy;  Laterality: N/A;   GASTROSTOMY TUBE PLACEMENT  09/27/2016   GIVENS CAPSULE STUDY N/A 04/24/2019   Procedure: GIVENS CAPSULE STUDY;  Surgeon: Gatha Mayer, MD;  [facility];  Service: Endoscopy;  Laterality: N/A;   HERNIA REPAIR Left 1981   IR PATIENT EVAL TECH 0-60 MINS  12/13/2016   IR PATIENT EVAL TECH 0-60 MINS  04/11/2017   IR PATIENT EVAL TECH 0-60 MINS  03/24/2018   IR PATIENT EVAL TECH 0-60 MINS  11/23/2018   IR PATIENT EVAL TECH 0-60 MINS  05/28/2019   IR REPLACE G-TUBE SIMPLE WO FLUORO  12/12/2017   IR REPLACE G-TUBE SIMPLE WO FLUORO  03/29/2018   IR REPLACE G-TUBE SIMPLE WO FLUORO  06/29/2018   IR REPLACE G-TUBE SIMPLE  WO FLUORO  11/02/2018   IR REPLACE G-TUBE SIMPLE WO FLUORO  03/08/2019   IR REPLACE G-TUBE SIMPLE WO FLUORO  07/02/2019   IR REPLACE G-TUBE SIMPLE WO FLUORO  11/30/2019   IR REPLACE G-TUBE SIMPLE WO FLUORO  12/27/2019   IR REPLACE G-TUBE SIMPLE WO FLUORO  05/27/2020   IR REPLACE G-TUBE SIMPLE WO FLUORO  07/09/2020   IR REPLACE G-TUBE SIMPLE WO FLUORO  10/23/2021   MODIFIED RADICAL NECK DISSECTION Left 09/27/2016   Levels 1 & 2   PARTIAL GLOSSECTOMY Left 09/27/2016   Left hemi partial glossectomy   SPINE SURGERY  01/20/2016   fusion   TONSILLECTOMY     tracheotomy  09/27/2016   TUBAL LIGATION  06/1988   Social History   Social History Narrative   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   Review of Systems   Objective:   Physical Exam BP 90/70   Pulse 85   Ht 5' 7"  (1.702 m)   Wt 159 lb 8 oz (72.3 kg)   LMP 11/15/2013   BMI 24.98 kg/m   Dysarthria Lungs cta Cor NL Abd soft NT G tube LUQ

## 2021-10-29 NOTE — Telephone Encounter (Signed)
I called and advised that this would need to ordered by her PCP or Gastro Dr. She states that her PCP wold not order it since [place] ordered before but at that she was in the hospital, however pt has reached out to her Gastro Dr and she is waiting on them to advise.

## 2021-10-29 NOTE — Patient Instructions (Signed)
We have sent the following medications to your pharmacy for you to pick up at your convenience: Dexilant  Due to recent changes in healthcare laws, you may see the results of your imaging and laboratory studies on MyChart before your provider has had a chance to review them.  We understand that in some cases there may be results that are confusing or concerning to you. Not all laboratory results come back in the same time frame and the provider may be waiting for multiple results in order to interpret others.  Please give [place] 48 hours in order for your provider to thoroughly review all the results before contacting the office for clarification of your results.   I appreciate the opportunity to care for you. Silvano Rusk, MD, [facility]

## 2021-11-01 ENCOUNTER — Encounter: Payer: Self-pay | Admitting: Adult Health

## 2021-11-01 NOTE — Assessment & Plan Note (Signed)
Kristen [name] is a 56 year old woman with h/o head and neck cancer s/p surgery and radiation here today for long term follow up of her cancer.    [name] has no clinical signs of head and neck cancer recurrence.  She and I discussed healthy diet and exercise.  She will let us know if her swallowing worsens and I will refer her to see our SLP Garald Balding.  I recommended that she keep her f/u with ENT as scheduled, as well as her routine cancer screenings--pap smears, mammogram, colon cancer screening, skin screening, and regular PCP visits.    We will see [name] back in one year for continued long term follow-up.

## 2021-11-02 ENCOUNTER — Telehealth: Payer: Self-pay

## 2021-11-02 MED ORDER — AMBULATORY NON FORMULARY MEDICATION: 11 refills | Status: DC

## 2021-11-02 NOTE — Telephone Encounter (Signed)
Per Rockne Coons with [facility] phone # [phone], ext # [phone]. He told me the supplies she needs and he requested that I fax a rx to the Intake department at fax # [phone]. Dr Carlean Purl said he will sign for her supplies since Dr Louanne Skye is retiring and she needs someone to help her.

## 2021-11-03 DIAGNOSIS — C801 Malignant (primary) neoplasm, unspecified: Secondary | ICD-10-CM | POA: Diagnosis not present

## 2021-11-03 DIAGNOSIS — C01 Malignant neoplasm of base of tongue: Secondary | ICD-10-CM | POA: Diagnosis not present

## 2021-11-03 DIAGNOSIS — C109 Malignant neoplasm of oropharynx, unspecified: Secondary | ICD-10-CM | POA: Diagnosis not present

## 2021-11-03 DIAGNOSIS — R1319 Other dysphagia: Secondary | ICD-10-CM | POA: Diagnosis not present

## 2021-11-03 DIAGNOSIS — E43 Unspecified severe protein-calorie malnutrition: Secondary | ICD-10-CM | POA: Diagnosis not present

## 2021-11-05 DIAGNOSIS — Z1152 Encounter for screening for COVID-19: Secondary | ICD-10-CM | POA: Diagnosis not present

## 2021-11-09 NOTE — Telephone Encounter (Signed)
I called and spoke to [facility] to see if she has gotten her Jevity nutrition supplies. She said yes she has and she said thank you Dr Carlean Purl for helping her.

## 2021-11-12 DIAGNOSIS — Z1152 Encounter for screening for COVID-19: Secondary | ICD-10-CM | POA: Diagnosis not present

## 2021-11-16 ENCOUNTER — Other Ambulatory Visit: Payer: Self-pay | Admitting: Specialist

## 2021-11-16 ENCOUNTER — Other Ambulatory Visit: Payer: Self-pay | Admitting: Internal Medicine

## 2021-11-19 DIAGNOSIS — Z1152 Encounter for screening for COVID-19: Secondary | ICD-10-CM | POA: Diagnosis not present

## 2021-12-02 DIAGNOSIS — C109 Malignant neoplasm of oropharynx, unspecified: Secondary | ICD-10-CM | POA: Diagnosis not present

## 2021-12-02 DIAGNOSIS — Z8589 Personal history of malignant neoplasm of other organs and systems: Secondary | ICD-10-CM | POA: Diagnosis not present

## 2021-12-02 DIAGNOSIS — Z08 Encounter for follow-up examination after completed treatment for malignant neoplasm: Secondary | ICD-10-CM | POA: Diagnosis not present

## 2021-12-02 DIAGNOSIS — R1312 Dysphagia, oropharyngeal phase: Secondary | ICD-10-CM | POA: Diagnosis not present

## 2021-12-02 DIAGNOSIS — Z1152 Encounter for screening for COVID-19: Secondary | ICD-10-CM | POA: Diagnosis not present

## 2021-12-04 DIAGNOSIS — Z1152 Encounter for screening for COVID-19: Secondary | ICD-10-CM | POA: Diagnosis not present

## 2021-12-08 DIAGNOSIS — Z1152 Encounter for screening for COVID-19: Secondary | ICD-10-CM | POA: Diagnosis not present

## 2021-12-16 ENCOUNTER — Other Ambulatory Visit: Payer: Self-pay | Admitting: Internal Medicine

## 2021-12-18 DIAGNOSIS — Z1152 Encounter for screening for COVID-19: Secondary | ICD-10-CM | POA: Diagnosis not present

## 2021-12-31 DIAGNOSIS — Z1152 Encounter for screening for COVID-19: Secondary | ICD-10-CM | POA: Diagnosis not present

## 2022-01-04 ENCOUNTER — Encounter: Payer: Self-pay | Admitting: Orthopedic Surgery

## 2022-01-05 ENCOUNTER — Telehealth: Payer: Self-pay | Admitting: Orthopedic Surgery

## 2022-01-05 DIAGNOSIS — M545 Low back pain, unspecified: Secondary | ICD-10-CM

## 2022-01-05 MED ORDER — NAPROXEN 125 MG/5ML PO SUSP: 250.0000 mg | Freq: Three times a day (TID) | ORAL | 3 refills | Status: DC

## 2022-01-05 NOTE — Telephone Encounter (Signed)
Orthopedic Telephone Encounter  Prescribed 138m per 5 mL naproxen suspension as she has been on this for a while.  My previous partner who retired, Dr. NLouanne Skye had her on this medication.  It provides her with some relief.  I continued until she can come in the office and see me.  MCallie Fielding MD Orthopedic Surgeon

## 2022-01-16 DIAGNOSIS — Z1152 Encounter for screening for COVID-19: Secondary | ICD-10-CM | POA: Diagnosis not present

## 2022-02-17 ENCOUNTER — Other Ambulatory Visit: Payer: Self-pay | Admitting: Internal Medicine

## 2022-02-22 ENCOUNTER — Telehealth: Payer: Self-pay | Admitting: Internal Medicine

## 2022-02-22 NOTE — Telephone Encounter (Signed)
Patient is requesting refill of ferrous sulfate medication.

## 2022-02-22 NOTE — Telephone Encounter (Signed)
I reached her voice mail and left a detailed message that I had sent in her iron 02/18/2022 to [place].

## 2022-02-28 ENCOUNTER — Encounter (HOSPITAL_COMMUNITY): Payer: Self-pay | Admitting: Internal Medicine

## 2022-02-28 ENCOUNTER — Observation Stay (HOSPITAL_COMMUNITY)
Admission: EM | Admit: 2022-02-28 | Discharge: 2022-03-01 | Disposition: A | Discharge status: Home / Self Care | Payer: Medicare Other | Attending: Internal Medicine | Admitting: Internal Medicine

## 2022-02-28 ENCOUNTER — Other Ambulatory Visit: Payer: Self-pay

## 2022-02-28 DIAGNOSIS — Z7982 Long term (current) use of aspirin: Secondary | ICD-10-CM | POA: Diagnosis not present

## 2022-02-28 DIAGNOSIS — T85598A Other mechanical complication of other gastrointestinal prosthetic devices, implants and grafts, initial encounter: Secondary | ICD-10-CM

## 2022-02-28 DIAGNOSIS — G8929 Other chronic pain: Secondary | ICD-10-CM | POA: Diagnosis not present

## 2022-02-28 DIAGNOSIS — G2581 Restless legs syndrome: Secondary | ICD-10-CM | POA: Diagnosis not present

## 2022-02-28 DIAGNOSIS — F32A Depression, unspecified: Secondary | ICD-10-CM | POA: Insufficient documentation

## 2022-02-28 DIAGNOSIS — M48061 Spinal stenosis, lumbar region without neurogenic claudication: Secondary | ICD-10-CM | POA: Diagnosis present

## 2022-02-28 DIAGNOSIS — K9423 Gastrostomy malfunction: Principal | ICD-10-CM | POA: Diagnosis present

## 2022-02-28 DIAGNOSIS — K219 Gastro-esophageal reflux disease without esophagitis: Secondary | ICD-10-CM | POA: Diagnosis present

## 2022-02-28 DIAGNOSIS — Z85819 Personal history of malignant neoplasm of unspecified site of lip, oral cavity, and pharynx: Secondary | ICD-10-CM | POA: Diagnosis not present

## 2022-02-28 DIAGNOSIS — F419 Anxiety disorder, unspecified: Secondary | ICD-10-CM | POA: Diagnosis not present

## 2022-02-28 DIAGNOSIS — M419 Scoliosis, unspecified: Secondary | ICD-10-CM | POA: Diagnosis not present

## 2022-02-28 DIAGNOSIS — Z79899 Other long term (current) drug therapy: Secondary | ICD-10-CM | POA: Diagnosis not present

## 2022-02-28 DIAGNOSIS — M549 Dorsalgia, unspecified: Secondary | ICD-10-CM | POA: Insufficient documentation

## 2022-02-28 DIAGNOSIS — D509 Iron deficiency anemia, unspecified: Secondary | ICD-10-CM | POA: Diagnosis not present

## 2022-02-28 DIAGNOSIS — I10 Essential (primary) hypertension: Secondary | ICD-10-CM | POA: Diagnosis not present

## 2022-02-28 DIAGNOSIS — Z923 Personal history of irradiation: Secondary | ICD-10-CM | POA: Diagnosis not present

## 2022-02-28 LAB — CBC WITH DIFFERENTIAL/PLATELET
Abs Immature Granulocytes: 0.04 10*3/uL (ref 0.00–0.07)
Basophils Absolute: 0 10*3/uL (ref 0.0–0.1)
Basophils Relative: 1 %
Eosinophils Absolute: 0.1 10*3/uL (ref 0.0–0.5)
Eosinophils Relative: 2 %
HCT: 35.6 % — ABNORMAL LOW (ref 36.0–46.0)
Hemoglobin: 11.2 g/dL — ABNORMAL LOW (ref 12.0–15.0)
Immature Granulocytes: 1 %
Lymphocytes Relative: 34 %
Lymphs Abs: 1.5 10*3/uL (ref 0.7–4.0)
MCH: 28.1 pg (ref 26.0–34.0)
MCHC: 31.5 g/dL (ref 30.0–36.0)
MCV: 89.2 fL (ref 80.0–100.0)
Monocytes Absolute: 0.3 10*3/uL (ref 0.1–1.0)
Monocytes Relative: 8 %
Neutro Abs: 2.4 10*3/uL (ref 1.7–7.7)
Neutrophils Relative %: 54 %
Platelets: 186 10*3/uL (ref 150–400)
RBC: 3.99 MIL/uL (ref 3.87–5.11)
RDW: 14.3 % (ref 11.5–15.5)
WBC: 4.4 10*3/uL (ref 4.0–10.5)
nRBC: 0 % (ref 0.0–0.2)

## 2022-02-28 LAB — BASIC METABOLIC PANEL
Anion gap: 5 (ref 5–15)
BUN: 16 mg/dL (ref 6–20)
CO2: 27 mmol/L (ref 22–32)
Calcium: 9.2 mg/dL (ref 8.9–10.3)
Chloride: 107 mmol/L (ref 98–111)
Creatinine, Ser: 0.66 mg/dL (ref 0.44–1.00)
GFR, Estimated: >60 mL/min (ref >60)
Glucose, Bld: 90 mg/dL (ref 70–99)
Potassium: 4.6 mmol/L (ref 3.5–5.1)
Sodium: 139 mmol/L (ref 135–145)

## 2022-02-28 MED ORDER — HYDROMORPHONE HCL 1 MG/ML IJ SOLN
1.0000 mg | INTRAMUSCULAR | Status: DC | PRN
  Administered 2022-03-01: 1 mg via INTRAVENOUS
  Filled 2022-02-28: qty 1

## 2022-02-28 MED ORDER — ACETAMINOPHEN 325 MG PO TABS: 650.0000 mg | ORAL_TABLET | Freq: Four times a day (QID) | ORAL | Status: DC | PRN

## 2022-02-28 MED ORDER — ACETAMINOPHEN 650 MG RE SUPP: 650.0000 mg | Freq: Four times a day (QID) | RECTAL | Status: DC | PRN

## 2022-02-28 MED ORDER — ONDANSETRON HCL 4 MG PO TABS: 4.0000 mg | ORAL_TABLET | Freq: Four times a day (QID) | ORAL | Status: DC | PRN

## 2022-02-28 MED ORDER — ONDANSETRON HCL 4 MG/2ML IJ SOLN: 4.0000 mg | Freq: Four times a day (QID) | INTRAMUSCULAR | Status: DC | PRN

## 2022-02-28 MED ORDER — HYDRALAZINE HCL 20 MG/ML IJ SOLN: 10.0000 mg | INTRAMUSCULAR | Status: DC | PRN

## 2022-02-28 MED ORDER — PANTOPRAZOLE SODIUM 40 MG IV SOLR
40.0000 mg | INTRAVENOUS | Status: DC
  Administered 2022-02-28: 40 mg via INTRAVENOUS
  Filled 2022-02-28: qty 10

## 2022-02-28 NOTE — H&P (Addendum)
History and Physical    Patient: Kristen [name] [id number] DOB: 10/10/1965 DOA: 02/28/2022 DOS: the patient was seen and examined on 02/28/2022 PCP: [name], MD  Patient coming from: Home  Chief Complaint: Dislodged PEG tube.  HPI: DAVIANNA [name] is a 57 y.o. female with medical history significant of allergies, iron deficiency anemia, anxiety, depression, osteoarthritis, diarrhea, GERD, hiatal hernia, nephrolithiasis, oral cancer, history of radiation to the base of the tongue, hypertension, iron deficiency anemia, migraine headaches, history of pneumonia, restless legs, scoliosis, herpes zoster, sleep apnea who presented to the emergency department due to accidental PEG tube dislodgment.  No fever, chills or night sweats. No sore throat, rhinorrhea, dyspnea, wheezing or hemoptysis.  No chest pain, palpitations, diaphoresis, PND, orthopnea or pitting edema of the lower extremities.  No appetite changes, significant abdominal pain, diarrhea, constipation, melena or hematochezia.  No flank pain, dysuria, frequency or hematuria.  No polyuria, polydipsia or polyphagia.  Lab work: Her CBC showed a white count 4.4, hemoglobin 11.2 g/dL platelets 186.  BMP was normal.  ED course: Initial vital signs were temperature 98.8 F, pulse 83, respirations 16, BP 125/83 mmHg O2 sat 97% on room air.  IR consulted by ED.  They will replace PEG in AM.   Review of Systems: As mentioned in the history of present illness. All other systems reviewed and are negative. Past Medical History:  Diagnosis Date   Allergy    Anemia    Iron deficiency   Anxiety    Arthritis    back, left shoulder   Blood transfusion without reported diagnosis    Chicken pox    Depression    Diarrhea    takes Imodium daily   GERD (gastroesophageal reflux disease)    H/O hiatal hernia    Headache(784.0)    History of kidney stones    1996ish   History of radiation therapy 11/16/16- 01/01/17   Base of Tongue/ 66  gy in 33 fractions/ Dose: 2 Gy   Hypertension    Iron deficiency anemia 12/11/2018   Migraine    none for 5 years (as of 01/13/16)   OSA (obstructive sleep apnea) 10/13/2015   unable to get cpap, plans to get one in 2018   Pneumonia    Restless legs    Scoliosis    Shingles 08/28/2013   Shortness of breath    with exertion   Sleep apnea    Tongue cancer ([place])    tongue cancer   Past Surgical History:  Procedure Laterality Date   BACK SURGERY  07/21/1978   CARPOMETACARPEL SUSPENSION PLASTY Left 01/21/2021   Procedure: Left Thumb Ligament Carpometacarpal Arthroplasty;  Surgeon: Sherilyn Cooter, MD;  Location: [place];  Service: Orthopedics;  Laterality: Left;   CHOLECYSTECTOMY N/A 08/15/2013   Procedure: LAPAROSCOPIC CHOLECYSTECTOMY WITH INTRAOPERATIVE CHOLANGIOGRAM;  Surgeon: Gwenyth Ober, MD;  Location: [place];  Service: General;  Laterality: N/A;   COLONOSCOPY N/A 01/09/2013   Procedure: COLONOSCOPY;  Surgeon: Beryle Beams, MD;  Location: [place];  Service: Endoscopy;  Laterality: N/A;   COLONOSCOPY  03/2018   DIRECT LARYNGOSCOPY  07/2016   Dr. Nicolette [facility]   ESOPHAGOGASTRODUODENOSCOPY  03/2018   ESOPHAGOGASTRODUODENOSCOPY (EGD) WITH PROPOFOL N/A 04/24/2019   Procedure: ESOPHAGOGASTRODUODENOSCOPY (EGD) WITH PROPOFOL;  Surgeon: Gatha Mayer, MD;  Location: [facility];  Service: Endoscopy;  Laterality: N/A;   GASTROSTOMY TUBE PLACEMENT  09/27/2016   GIVENS CAPSULE STUDY N/A 04/24/2019   Procedure: GIVENS CAPSULE STUDY;  Surgeon: Carlean Purl,  Ofilia Neas, MD;  Location: [facility];  Service: Endoscopy;  Laterality: N/A;   HERNIA REPAIR Left 1981   IR PATIENT EVAL TECH 0-60 MINS  12/13/2016   IR PATIENT EVAL TECH 0-60 MINS  04/11/2017   IR PATIENT EVAL TECH 0-60 MINS  03/24/2018   IR PATIENT EVAL TECH 0-60 MINS  11/23/2018   IR PATIENT EVAL TECH 0-60 MINS  05/28/2019   IR REPLACE G-TUBE SIMPLE WO FLUORO  12/12/2017   IR REPLACE G-TUBE SIMPLE WO FLUORO  03/29/2018   IR REPLACE G-TUBE  SIMPLE WO FLUORO  06/29/2018   IR REPLACE G-TUBE SIMPLE WO FLUORO  11/02/2018   IR REPLACE G-TUBE SIMPLE WO FLUORO  03/08/2019   IR REPLACE G-TUBE SIMPLE WO FLUORO  07/02/2019   IR REPLACE G-TUBE SIMPLE WO FLUORO  11/30/2019   IR REPLACE G-TUBE SIMPLE WO FLUORO  12/27/2019   IR REPLACE G-TUBE SIMPLE WO FLUORO  05/27/2020   IR REPLACE G-TUBE SIMPLE WO FLUORO  07/09/2020   IR REPLACE G-TUBE SIMPLE WO FLUORO  10/23/2021   MODIFIED RADICAL NECK DISSECTION Left 09/27/2016   Levels 1 & 2   PARTIAL GLOSSECTOMY Left 09/27/2016   Left hemi partial glossectomy   SPINE SURGERY  01/20/2016   fusion   TONSILLECTOMY     tracheotomy  09/27/2016   TUBAL LIGATION  06/1988   Social History:  reports that she has never smoked. She has never used smokeless tobacco. She reports current alcohol use of about 7.0 standard drinks of alcohol per week. She reports that she does not use drugs.  Allergies  Allergen Reactions   Other Hives and Swelling    tomato   Sulfa Antibiotics Hives    Family History  Problem Relation Age of Onset   Heart disease Father    Heart attack Father    Hypertension Mother    Arthritis Mother    Diabetes Maternal Grandmother    Diabetes Paternal Grandmother    Diabetes Maternal Uncle    Colon polyps Neg Hx    Colon cancer Neg Hx    Esophageal cancer Neg Hx    Stomach cancer Neg Hx    Rectal cancer Neg Hx     Prior to Admission medications   Medication Sig Start Date End Date Taking? Authorizing Provider  AMBULATORY NON FORMULARY MEDICATION Jevity 1.5 Sig: 6 cans per day, Bolus feeding  85m syringes #30  Tape and gauze 11/02/21   GGatha Mayer MD  amLODipine (NORVASC) 10 MG tablet Take 1 tablet (10 mg total) by mouth daily. 08/27/21   BBillie Ruddy MD  aspirin 81 MG chewable tablet Place 81 mg into feeding tube daily.  10/05/16   [provider]  baclofen (LIORESAL) 10 MG tablet TAKE 1 TABLET(10 MG) BY MOUTH THREE TIMES DAILY 08/24/21   NJessy Oto MD   Calcium Citrate 200 MG TABS Place 200 mg into feeding tube daily at 12 noon.    [provider]  cefpodoxime (VANTIN) 200 MG tablet Take 1 tablet (200 mg total) by mouth 2 (two) times daily. 08/24/21   GCarlisle Cater PA-C  Cholecalciferol (VITAMIN D3) 10 MCG (400 UNIT) tablet Take 400 Units by mouth daily.    [provider]  clotrimazole (MYCELEX) 10 MG troche Take 1 tablet (10 mg total) by mouth 3 (three) times daily as needed (infection). 10/28/20   Causey, LCharlestine Massed NP  DEXILANT 60 MG capsule Place 1 capsule (60 mg total) into feeding tube daily. 10/29/21  Gatha Mayer, MD  diclofenac (VOLTAREN) 75 MG EC tablet TAKE 1 TABLET BY MOUTH TWICE DAILY WITH FOOD AS NEEDED FOR PAIN 11/16/21   Jessy Oto, MD  diphenoxylate-atropine (LOMOTIL) 2.5-0.025 MG tablet TAKE 2 TABLETS VIA FEEDING TUBE TWICE DAILY 12/16/21   Gatha Mayer, MD  ferrous sulfate 220 (44 Fe) MG/5ML solution TAKE 6.8 ML VIA FEEDING TUBE EVERY DAY 02/18/22   Gatha Mayer, MD  folic acid (FOLVITE) A999333 MCG tablet Place 400 mcg into feeding tube daily.    [provider]  lidocaine (LMX) 4 % cream Apply 1 application. topically as needed (pain).    [provider]  magic mouthwash SOLN 1 part diphenhydramine 12.5/5 mL, 1 for dexamethasone 0.5 mg / 5 mL, 1 part nystatin oral suspension. Swish and spit 2 teaspoons every 4-6 hours. 08/27/21   [name], MD  methocarbamol (ROBAXIN) 500 MG tablet Place 1 tablet (500 mg total) into feeding tube every 6 (six) hours as needed for muscle spasms. 05/01/21   Jessy Oto, MD  naproxen (NAPROSYN) 125 MG/5ML suspension Take 10 mLs (250 mg total) by mouth 3 (three) times daily with meals. 01/05/22   Callie Fielding, MD  ondansetron (ZOFRAN-ODT) 4 MG disintegrating tablet Take 1 tablet (4 mg total) by mouth every 8 (eight) hours as needed for nausea or vomiting. 08/24/21   Carlisle Cater, PA-C  oxyCODONE (ROXICODONE) 5 MG/5ML solution Place 7.5  mLs (7.5 mg total) into feeding tube every 4 (four) hours as needed for moderate pain or severe pain ((score 4-6)). 07/24/21   Jessy Oto, MD  potassium chloride SA (KLOR-CON) 20 MEQ tablet TAKE 1 TABLET BY MOUTH TWICE DAILY FOR THE NEXT 3 DAYS. OKAY TO CRUSH IF UNABLE TO SWALLOW. CAN ALSO TAKE PER TUBE IF NEEDED. Patient taking differently: 20 mEq at bedtime. OKAY TO CRUSH IF UNABLE TO SWALLOW. CAN ALSO TAKE PER TUBE IF NEEDED. 08/13/19   [name], MD  pregabalin (LYRICA) 75 MG capsule TAKE 1 CAPSULE(75 MG) BY MOUTH TWICE DAILY 06/25/21   Jessy Oto, MD  sertraline (ZOLOFT) 25 MG tablet TAKE 1 TABLET(25 MG) BY MOUTH DAILY 08/27/21   [name], MD  spironolactone (ALDACTONE) 25 MG tablet Take 25 mg by mouth 2 (two) times daily. 11/19/20   [provider]    Physical Exam: Vitals:   02/28/22 1145  BP: 125/83  Pulse: 83  Resp: 16  Temp: 98.8 F (37.1 C)  TempSrc: Oral  SpO2: 97%  Weight: 72.1 kg  Height: 5' 7"$  (1.702 m)   Physical Exam Vitals and nursing note reviewed.  Constitutional:      General: She is awake. She is not in acute distress.    Appearance: Normal appearance.  HENT:     Head: Normocephalic.     Nose: No rhinorrhea.     Mouth/Throat:     Mouth: Mucous membranes are moist.  Eyes:     General: No scleral icterus.    Pupils: Pupils are equal, round, and reactive to light.  Neck:     Vascular: No JVD.  Cardiovascular:     Rate and Rhythm: Normal rate and regular rhythm.     Heart sounds: S1 normal and S2 normal.  Pulmonary:     Effort: Pulmonary effort is normal.     Breath sounds: Normal breath sounds. No wheezing, rhonchi or rales.  Abdominal:     General: Bowel sounds are normal. There is no distension.  Palpations: Abdomen is soft.     Tenderness: There is no abdominal tenderness. There is no guarding.     Comments: Left PEG ostomy site without significant erythema or discharge.  Musculoskeletal:     Cervical back: Neck supple.      Right lower leg: No edema.     Left lower leg: No edema.  Skin:    General: Skin is warm and dry.  Neurological:     General: No focal deficit present.     Mental Status: She is alert and oriented to person, place, and time.  Psychiatric:        Mood and Affect: Mood normal.        Behavior: Behavior normal. Behavior is cooperative.   Data Reviewed:  Results are pending, will review when available.  Assessment and Plan: Principal Problem:   PEG tube malfunction ([place]) Observation/MedSurg. Per patient, she is able to eat soft diet. However, has trouble swallowing medications N.p.o. after midnight. Parental analgesics as needed. Antiemetics as needed.  Active Problems:   Spinal stenosis of lumbar region On oxycodone at home. Hydromorphone 1 mg IVP every 4 hours as needed.    GERD (gastroesophageal reflux disease) Pantoprazole 40 mg IVP daily..    Essential hypertension Continue blood pressure monitoring. IV hydralazine as needed.      Advance Care Planning:   Code Status: Full Code   Consults: Interventional radiology.  Family Communication:   Severity of Illness: The appropriate patient status for this patient is OBSERVATION. Observation status is judged to be reasonable and necessary in order to provide the required intensity of service to ensure the patient's safety. The patient's presenting symptoms, physical exam findings, and initial radiographic and laboratory data in the context of their medical condition is felt to place them at decreased risk for further clinical deterioration. Furthermore, it is anticipated that the patient will be medically stable for discharge from the hospital within 2 midnights of admission.   Author: Reubin Milan, MD 02/28/2022 2:11 PM  For on call review [email or web address].   This document was prepared using Dragon voice recognition software and may contain some unintended transcription errors.

## 2022-02-28 NOTE — ED Notes (Signed)
Called 6E for purple man for handoff

## 2022-02-28 NOTE — ED Provider Notes (Signed)
EMERGENCY DEPARTMENT AT [facility] Provider Note   CSN: [id number] Arrival date & time: 02/28/22  1136     History  No chief complaint on file.   Kristen [name] is a 57 y.o. female.  HPI   This is a 57 year old female presenting to the emergency department due to dislodged feeding tube.  Patient is a has 66 French gastrotomy tube which she uses take medications need to drink.  This came out at 845 this morning, she is not having any abdominal pain or fevers.  Home Medications Prior to Admission medications   Medication Sig Start Date End Date Taking? Authorizing Provider  AMBULATORY NON FORMULARY MEDICATION Jevity 1.5 Sig: 6 cans per day, Bolus feeding  60ml syringes #30  Tape and gauze 11/02/21   Iva Boop, MD  amLODipine (NORVASC) 10 MG tablet Take 1 tablet (10 mg total) by mouth daily. 08/27/21   Deeann Saint, MD  aspirin 81 MG chewable tablet Place 81 mg into feeding tube daily.  10/05/16   [provider]  baclofen (LIORESAL) 10 MG tablet TAKE 1 TABLET(10 MG) BY MOUTH THREE TIMES DAILY 08/24/21   Kerrin Champagne, MD  Calcium Citrate 200 MG TABS Place 200 mg into feeding tube daily at 12 noon.    [provider]  cefpodoxime (VANTIN) 200 MG tablet Take 1 tablet (200 mg total) by mouth 2 (two) times daily. 08/24/21   Renne Crigler, PA-C  Cholecalciferol (VITAMIN D3) 10 MCG (400 UNIT) tablet Take 400 Units by mouth daily.    [provider]  clotrimazole (MYCELEX) 10 MG troche Take 1 tablet (10 mg total) by mouth 3 (three) times daily as needed (infection). 10/28/20   Causey, Larna Daughters, NP  DEXILANT 60 MG capsule Place 1 capsule (60 mg total) into feeding tube daily. 10/29/21   Iva Boop, MD  diclofenac (VOLTAREN) 75 MG EC tablet TAKE 1 TABLET BY MOUTH TWICE DAILY WITH FOOD AS NEEDED FOR PAIN 11/16/21   Kerrin Champagne, MD  diphenoxylate-atropine (LOMOTIL) 2.5-0.025 MG tablet TAKE 2 TABLETS VIA FEEDING TUBE  TWICE DAILY 12/16/21   Iva Boop, MD  ferrous sulfate 220 (44 Fe) MG/5ML solution TAKE 6.8 ML VIA FEEDING TUBE EVERY DAY 02/18/22   Iva Boop, MD  folic acid (FOLVITE) 400 MCG tablet Place 400 mcg into feeding tube daily.    [provider]  lidocaine (LMX) 4 % cream Apply 1 application. topically as needed (pain).    [provider]  magic mouthwash SOLN 1 part diphenhydramine 12.5/5 mL, 1 for dexamethasone 0.5 mg / 5 mL, 1 part nystatin oral suspension. Swish and spit 2 teaspoons every 4-6 hours. 08/27/21   Deeann Saint, MD  methocarbamol (ROBAXIN) 500 MG tablet Place 1 tablet (500 mg total) into feeding tube every 6 (six) hours as needed for muscle spasms. 05/01/21   Kerrin Champagne, MD  naproxen (NAPROSYN) 125 MG/5ML suspension Take 10 mLs (250 mg total) by mouth 3 (three) times daily with meals. 01/05/22   London Sheer, MD  ondansetron (ZOFRAN-ODT) 4 MG disintegrating tablet Take 1 tablet (4 mg total) by mouth every 8 (eight) hours as needed for nausea or vomiting. 08/24/21   Renne Crigler, PA-C  oxyCODONE (ROXICODONE) 5 MG/5ML solution Place 7.5 mLs (7.5 mg total) into feeding tube every 4 (four) hours as needed for moderate pain or severe pain ((score 4-6)). 07/24/21   Kerrin Champagne, MD  potassium chloride SA (  KLOR-CON) 20 MEQ tablet TAKE 1 TABLET BY MOUTH TWICE DAILY FOR THE NEXT 3 DAYS. OKAY TO CRUSH IF UNABLE TO SWALLOW. CAN ALSO TAKE PER TUBE IF NEEDED. Patient taking differently: 20 mEq at bedtime. OKAY TO CRUSH IF UNABLE TO SWALLOW. CAN ALSO TAKE PER TUBE IF NEEDED. 08/13/19   Deeann Saint, MD  pregabalin (LYRICA) 75 MG capsule TAKE 1 CAPSULE(75 MG) BY MOUTH TWICE DAILY 06/25/21   Kerrin Champagne, MD  sertraline (ZOLOFT) 25 MG tablet TAKE 1 TABLET(25 MG) BY MOUTH DAILY 08/27/21   Deeann Saint, MD  spironolactone (ALDACTONE) 25 MG tablet Take 25 mg by mouth 2 (two) times daily. 11/19/20   [provider]      Allergies    Other and Sulfa  antibiotics    Review of Systems   Review of Systems  Physical Exam Updated Vital Signs BP (!) 124/97 (BP Location: Right Arm)   Pulse 70   Temp 98.8 F (37.1 C) (Oral)   Resp 16   Ht 5\' 7"  (1.702 m)   Wt 72.1 kg   LMP 11/15/2013   SpO2 99%   BMI 24.90 kg/m  Physical Exam Vitals and nursing note reviewed. Exam conducted with a chaperone present.  Constitutional:      Appearance: Normal appearance.  HENT:     Head: Normocephalic and atraumatic.  Eyes:     General: No scleral icterus.       Right eye: No discharge.        Left eye: No discharge.     Extraocular Movements: Extraocular movements intact.     Pupils: Pupils are equal, round, and reactive to light.  Cardiovascular:     Rate and Rhythm: Normal rate and regular rhythm.     Pulses: Normal pulses.     Heart sounds: Normal heart sounds. No murmur heard.    No friction rub. No gallop.  Pulmonary:     Effort: Pulmonary effort is normal. No respiratory distress.     Breath sounds: Normal breath sounds.  Abdominal:     General: Abdomen is flat. Bowel sounds are normal. There is no distension.     Palpations: Abdomen is soft.     Tenderness: There is no abdominal tenderness.     Comments: Ostomy site  Skin:    General: Skin is warm and dry.     Coloration: Skin is not jaundiced.  Neurological:     Mental Status: She is alert. Mental status is at baseline.     Coordination: Coordination normal.     ED Results / Procedures / Treatments   Labs (all labs ordered are listed, but only abnormal results are displayed) Labs Reviewed  CBC WITH DIFFERENTIAL/PLATELET - Abnormal; Notable for the following components:      Result Value   Hemoglobin 11.2 (*)    HCT 35.6 (*)    All other components within normal limits  BASIC METABOLIC PANEL    EKG None  Radiology No results found.  Procedures Procedures    Medications Ordered in ED Medications - No data to display  ED Course/ Medical Decision Making/  A&P Clinical Course as of 02/28/22 1421  Sun Feb 28, 2022  1411 CBC with Differential(!) No leukocytosis, mildly anemic hemoglobin 11.2 [HS]    Clinical Course User Index [HS] Theron Arista, PA-C  Medical Decision Making Amount and/or Complexity of Data Reviewed Labs: ordered. Decision-making details documented in ED Course.  Risk Decision regarding hospitalization.   This is a 57 year old female presenting to the emergency department due to displaced ostomy tube.  Attempted to reinsert 18-gauge as well as 16-gauge gastrotomy tube without success, attending also attempted.  I consulted with IR, spoke with Dr.Shick he advises hospitalist admit with IR intervention tomorrow.  I consulted Dr. Robb Matar who accepts the patient for admission        Final Clinical Impression(s) / ED Diagnoses Final diagnoses:  Feeding tube dysfunction, initial encounter    Rx / DC Orders ED Discharge Orders     None         Theron Arista, PA-C 02/28/22 1421    Derwood Kaplan, MD 03/04/22 1542

## 2022-02-28 NOTE — ED Notes (Signed)
ED TO INPATIENT HANDOFF REPORT  ED Nurse Name and Phone #: Izola Price [phone]  S Name/Age/Gender Kristen [name] 57 y.o. female Room/Bed: WTR5/WTR5  Code Status   Code Status: Full Code  Home/SNF/Other N/a Patient oriented to: self, place, time, and situation Is this baseline? Yes   Triage Complete: Triage complete  Chief Complaint PEG tube malfunction [facility]) [K94.23]  Triage Note Pt presents to ED from home C/O G-tube coming out around 0845 this AM, needs to be replaced.   Allergies Allergies  Allergen Reactions   Sulfa Antibiotics Hives   Tomato Hives and Swelling    Level of Care/Admitting Diagnosis ED Disposition     ED Disposition  Admit   Condition  --   Comment  Hospital Area: [place] [[id number]]  Level of Care: Med-Surg [16]  May place patient in observation at [facility] or [place] if equivalent level of care is available:: No  Covid Evaluation: Asymptomatic - no recent exposure (last 10 days) testing not required  Diagnosis: PEG tube malfunction [facility]) [id number]  Admitting Physician: Reubin Milan [id number]  Attending Physician: Reubin Milan [id number]          B Medical/Surgery History Past Medical History:  Diagnosis Date   Allergy    Anemia    Iron deficiency   Anxiety    Arthritis    back, left shoulder   Blood transfusion without reported diagnosis    Chicken pox    Depression    Diarrhea    takes Imodium daily   GERD (gastroesophageal reflux disease)    H/O hiatal hernia    Headache(784.0)    History of kidney stones    1996ish   History of radiation therapy 11/16/16- 01/01/17   Base of Tongue/ 66 gy in 33 fractions/ Dose: 2 Gy   Hypertension    Iron deficiency anemia 12/11/2018   Migraine    none for 5 years (as of 01/13/16)   OSA (obstructive sleep apnea) 10/13/2015   unable to get cpap, plans to get one in 2018   Pneumonia    Restless legs    Scoliosis    Shingles 08/28/2013    Shortness of breath    with exertion   Sleep apnea    Tongue cancer ([place])    tongue cancer   Past Surgical History:  Procedure Laterality Date   BACK SURGERY  07/21/1978   CARPOMETACARPEL SUSPENSION PLASTY Left 01/21/2021   Procedure: Left Thumb Ligament Carpometacarpal Arthroplasty;  Surgeon: Sherilyn Cooter, MD;  Location: [place];  Service: Orthopedics;  Laterality: Left;   CHOLECYSTECTOMY N/A 08/15/2013   Procedure: LAPAROSCOPIC CHOLECYSTECTOMY WITH INTRAOPERATIVE CHOLANGIOGRAM;  Surgeon: Gwenyth Ober, MD;  Location: [place];  Service: General;  Laterality: N/A;   COLONOSCOPY N/A 01/09/2013   Procedure: COLONOSCOPY;  Surgeon: Beryle Beams, MD;  Location: [place];  Service: Endoscopy;  Laterality: N/A;   COLONOSCOPY  03/2018   DIRECT LARYNGOSCOPY  07/2016   Dr. Nicolette Bang [facility]   ESOPHAGOGASTRODUODENOSCOPY  03/2018   ESOPHAGOGASTRODUODENOSCOPY (EGD) WITH PROPOFOL N/A 04/24/2019   Procedure: ESOPHAGOGASTRODUODENOSCOPY (EGD) WITH PROPOFOL;  Surgeon: Gatha Mayer, MD;  Location: [facility];  Service: Endoscopy;  Laterality: N/A;   GASTROSTOMY TUBE PLACEMENT  09/27/2016   GIVENS CAPSULE STUDY N/A 04/24/2019   Procedure: GIVENS CAPSULE STUDY;  Surgeon: Gatha Mayer, MD;  Location: [facility];  Service: Endoscopy;  Laterality: N/A;   HERNIA REPAIR Left 1981   IR PATIENT EVAL TECH 0-60 MINS  12/13/2016   IR PATIENT EVAL TECH 0-60 MINS  04/11/2017   IR PATIENT EVAL TECH 0-60 MINS  03/24/2018   IR PATIENT EVAL TECH 0-60 MINS  11/23/2018   IR PATIENT EVAL TECH 0-60 MINS  05/28/2019   IR REPLACE G-TUBE SIMPLE WO FLUORO  12/12/2017   IR REPLACE G-TUBE SIMPLE WO FLUORO  03/29/2018   IR REPLACE G-TUBE SIMPLE WO FLUORO  06/29/2018   IR REPLACE G-TUBE SIMPLE WO FLUORO  11/02/2018   IR REPLACE G-TUBE SIMPLE WO FLUORO  03/08/2019   IR REPLACE G-TUBE SIMPLE WO FLUORO  07/02/2019   IR REPLACE G-TUBE SIMPLE WO FLUORO  11/30/2019   IR REPLACE G-TUBE SIMPLE WO FLUORO  12/27/2019   IR REPLACE G-TUBE  SIMPLE WO FLUORO  05/27/2020   IR REPLACE G-TUBE SIMPLE WO FLUORO  07/09/2020   IR REPLACE G-TUBE SIMPLE WO FLUORO  10/23/2021   MODIFIED RADICAL NECK DISSECTION Left 09/27/2016   Levels 1 & 2   PARTIAL GLOSSECTOMY Left 09/27/2016   Left hemi partial glossectomy   SPINE SURGERY  01/20/2016   fusion   TONSILLECTOMY     tracheotomy  09/27/2016   TUBAL LIGATION  06/1988     A IV Location/Drains/Wounds Patient Lines/Drains/Airways Status     Active Line/Drains/Airways     Name Placement date Placement time Site Days   Peripheral IV 02/28/22 20 G 1" Left Antecubital 02/28/22  1401  Antecubital  less than 1   Closed System Drain 1 Right Back Accordion (Hemovac) 10 Fr. 11/06/18  1315  Back  1210   Closed System Drain Midline Back Accordion (Hemovac) 10 Fr. 04/28/21  1350  Back  306   Gastrostomy/Enterostomy Gastrostomy 18 Fr. LLQ 10/23/21  1112  LLQ  128   Incision (Closed) 11/06/18 Back Other (Comment) 11/06/18  1346  -- 1210   Incision (Closed) 01/21/21 Arm Left 01/21/21  1732  -- 403   Incision (Closed) 04/28/21 Back 04/28/21  1402  -- 306            Intake/Output Last 24 hours No intake or output data in the 24 hours ending 02/28/22 1455  Labs/Imaging Results for orders placed or performed during the hospital encounter of 02/28/22 (from the past 48 hour(s))  Basic metabolic panel     Status: None   Collection Time: 02/28/22  1:27 PM  Result Value Ref Range   Sodium 139 135 - 145 mmol/L   Potassium 4.6 3.5 - 5.1 mmol/L   Chloride 107 98 - 111 mmol/L   CO2 27 22 - 32 mmol/L   Glucose, Bld 90 70 - 99 mg/dL    Comment: Glucose reference range applies only to samples taken after fasting for at least 8 hours.   BUN 16 6 - 20 mg/dL   Creatinine, Ser 0.66 0.44 - 1.00 mg/dL   Calcium 9.2 8.9 - 10.3 mg/dL   GFR, Estimated >60 >60 mL/min    Comment: (NOTE) Calculated using the CKD-EPI Creatinine Equation (2021)    Anion gap 5 5 - 15    Comment: Performed at [facility], [place] [address], [place], [place] [address]  CBC with Differential     Status: Abnormal   Collection Time: 02/28/22  1:27 PM  Result Value Ref Range   WBC 4.4 4.0 - 10.5 K/uL   RBC 3.99 3.87 - 5.11 MIL/uL   Hemoglobin 11.2 (L) 12.0 - 15.0 g/dL   HCT 35.6 (L) 36.0 - 46.0 %   MCV 89.2  80.0 - 100.0 fL   MCH 28.1 26.0 - 34.0 pg   MCHC 31.5 30.0 - 36.0 g/dL   RDW 14.3 11.5 - 15.5 %   Platelets 186 150 - 400 K/uL   nRBC 0.0 0.0 - 0.2 %   Neutrophils Relative % 54 %   Neutro Abs 2.4 1.7 - 7.7 K/uL   Lymphocytes Relative 34 %   Lymphs Abs 1.5 0.7 - 4.0 K/uL   Monocytes Relative 8 %   Monocytes Absolute 0.3 0.1 - 1.0 K/uL   Eosinophils Relative 2 %   Eosinophils Absolute 0.1 0.0 - 0.5 K/uL   Basophils Relative 1 %   Basophils Absolute 0.0 0.0 - 0.1 K/uL   Immature Granulocytes 1 %   Abs Immature Granulocytes 0.04 0.00 - 0.07 K/uL    Comment: Performed at [facility], [place] [address], [place], [place] [address]   *Note: Due to a large number of results and/or encounters for the requested time period, some results have not been displayed. A complete set of results can be found in Results Review.   No results found.  Pending Labs Unresulted Labs (From admission, onward)     Start     Ordered   03/01/22 0500  HIV Antibody (routine testing w rflx)  (HIV Antibody (Routine testing w reflex) panel)  Tomorrow morning,   R        02/28/22 1424   03/01/22 0500  CBC  Tomorrow morning,   R        02/28/22 1424   03/01/22 0500  Comprehensive metabolic panel  Tomorrow morning,   R        02/28/22 1424            Vitals/Pain Today's Vitals   02/28/22 1145 02/28/22 1411  BP: 125/83 (!) 124/97  Pulse: 83 70  Resp: 16 16  Temp: 98.8 F (37.1 C)   TempSrc: Oral   SpO2: 97% 99%  Weight: 72.1 kg   Height: 5' 7"$  (1.702 m)   PainSc: 0-No pain     Isolation Precautions No active isolations  Medications Medications  acetaminophen (TYLENOL)  tablet 650 mg (has no administration in time range)    Or  acetaminophen (TYLENOL) suppository 650 mg (has no administration in time range)  ondansetron (ZOFRAN) tablet 4 mg (has no administration in time range)    Or  ondansetron (ZOFRAN) injection 4 mg (has no administration in time range)    Mobility walks     Focused Assessments N/a   R Recommendations: See Admitting Provider Note  Report given to:   Additional Notes: n/a

## 2022-02-28 NOTE — ED Triage Notes (Signed)
Pt presents to ED from home C/O G-tube coming out around 0845 this AM, needs to be replaced.

## 2022-03-01 ENCOUNTER — Other Ambulatory Visit: Payer: Self-pay | Admitting: Radiology

## 2022-03-01 ENCOUNTER — Observation Stay (HOSPITAL_COMMUNITY): Payer: Medicare Other

## 2022-03-01 DIAGNOSIS — Z931 Gastrostomy status: Secondary | ICD-10-CM

## 2022-03-01 DIAGNOSIS — Z431 Encounter for attention to gastrostomy: Secondary | ICD-10-CM | POA: Diagnosis not present

## 2022-03-01 DIAGNOSIS — K9423 Gastrostomy malfunction: Secondary | ICD-10-CM | POA: Diagnosis not present

## 2022-03-01 HISTORY — PX: IR REPLC GASTRO/COLONIC TUBE PERCUT W/FLUORO: IMG2333

## 2022-03-01 LAB — COMPREHENSIVE METABOLIC PANEL
ALT: 17 U/L (ref 0–44)
AST: 18 U/L (ref 15–41)
Albumin: 4.4 g/dL (ref 3.5–5.0)
Alkaline Phosphatase: 87 U/L (ref 38–126)
Anion gap: 8 (ref 5–15)
BUN: 15 mg/dL (ref 6–20)
CO2: 26 mmol/L (ref 22–32)
Calcium: 9 mg/dL (ref 8.9–10.3)
Chloride: 105 mmol/L (ref 98–111)
Creatinine, Ser: 0.64 mg/dL (ref 0.44–1.00)
GFR, Estimated: >60 mL/min (ref >60)
Glucose, Bld: 95 mg/dL (ref 70–99)
Potassium: 4 mmol/L (ref 3.5–5.1)
Sodium: 139 mmol/L (ref 135–145)
Total Bilirubin: 0.9 mg/dL (ref 0.3–1.2)
Total Protein: 7.1 g/dL (ref 6.5–8.1)

## 2022-03-01 LAB — CBC
HCT: 35.1 % — ABNORMAL LOW (ref 36.0–46.0)
Hemoglobin: 11.3 g/dL — ABNORMAL LOW (ref 12.0–15.0)
MCH: 28.4 pg (ref 26.0–34.0)
MCHC: 32.2 g/dL (ref 30.0–36.0)
MCV: 88.2 fL (ref 80.0–100.0)
Platelets: 182 10*3/uL (ref 150–400)
RBC: 3.98 MIL/uL (ref 3.87–5.11)
RDW: 14.3 % (ref 11.5–15.5)
WBC: 3.7 10*3/uL — ABNORMAL LOW (ref 4.0–10.5)
nRBC: 0 % (ref 0.0–0.2)

## 2022-03-01 LAB — HIV ANTIBODY (ROUTINE TESTING W REFLEX): HIV Screen 4th Generation wRfx: NONREACTIVE

## 2022-03-01 MED ORDER — HYDROMORPHONE HCL 1 MG/ML IJ SOLN: 0.5000 mg | INTRAMUSCULAR | Status: DC | PRN

## 2022-03-01 MED ORDER — IOHEXOL 300 MG/ML  SOLN
50.0000 mL | Freq: Once | INTRAMUSCULAR | Status: AC | PRN
  Administered 2022-03-01: 20 mL

## 2022-03-01 MED ORDER — LIDOCAINE VISCOUS HCL 2 % MT SOLN
OROMUCOSAL | Status: AC
  Administered 2022-03-01: 10 mL
  Filled 2022-03-01: qty 15

## 2022-03-01 MED ORDER — NAPROXEN 125 MG/5ML PO SUSP: 250.0000 mg | Freq: Three times a day (TID) | ORAL | 3 refills | Status: DC | PRN

## 2022-03-01 NOTE — Progress Notes (Signed)
PROGRESS NOTE  Kristen [name]  [id number] DOB: [birth year] DOA: 02/28/2022 PCP: [name], MD   Brief Narrative: Patient is a 57 year old female with history of allergies, iron deficiency anemia, anxiety, depression, GERD, oral cancer /sp PEG , hypertension, with accidental PEG tube dislodgment.  Hemodynamically stable on presentation.  IR consulted for PEG replacement  Assessment & Plan:  Principal Problem:   PEG tube malfunction ([place]) Active Problems:   Spinal stenosis of lumbar region   GERD (gastroesophageal reflux disease)   Essential hypertension   PEG tube dislodgment: Patient is still able to eat soft diet.  But has trouble swallowing medications.  Status post PEG tube which got dislodged and she presented to the ED. IR consulted, plan for replacement.  She needs to have this tube for her pills  Chronic back pain/spinal stenosis: Continue pain management, supportive care  GERD: On PPI at home  Hypertension: Continue blood pressure monitoring.  Takes amlodipine at home.  Blood pressure stable        DVT prophylaxis:SCDs Start: 02/28/22 1424     Code Status: Full Code  Family Communication: None at the bedside  Patient status:obs  Patient is from : Home  Anticipated discharge to: Home  Estimated DC date: After tube placement   Consultants: IR  Procedures: None yet  Antimicrobials:  Anti-infectives (From admission, onward)    None       Subjective: Patient seen and examined at bedside today.  Hemodynamically stable.  She was waiting for IR evaluation for PEG replacement.  Tube has completely came out from her belly.  She says she needs PEG tube for her pills but can swallow fine regarding food.  She says she wants to go home after tube placement  Objective: Vitals:   02/28/22 1603 02/28/22 2014 03/01/22 0007 03/01/22 0403  BP: (!) 120/95 111/86 101/78 104/76  Pulse: 68 66 65 65  Resp: 18 14 16 15  $ Temp: 98.2 F (36.8 C) 98 F (36.7  C) 97.9 F (36.6 C) 98.1 F (36.7 C)  TempSrc: Oral Oral Oral Oral  SpO2: 99% 99% 98% 97%  Weight:      Height:        Intake/Output Summary (Last 24 hours) at 03/01/2022 [id number] Last data filed at 03/01/2022 0600 Gross per 24 hour  Intake 120 ml  Output 0 ml  Net 120 ml   Filed Weights   02/28/22 1145  Weight: 72.1 kg    Examination:  General exam: Overall comfortable, not in distress HEENT: PERRL Respiratory system:  no wheezes or crackles  Cardiovascular system: S1 & S2 heard, RRR.  Gastrointestinal system: Abdomen is nondistended, soft and nontender.Previous G tube site Central nervous system: Alert and oriented Extremities: No edema, no clubbing ,no cyanosis Skin: No rashes, no ulcers,no icterus     Data Reviewed: I have personally reviewed following labs and imaging studies  CBC: Recent Labs  Lab 02/28/22 1327 03/01/22 0522  WBC 4.4 3.7*  NEUTROABS 2.4  --   HGB 11.2* 11.3*  HCT 35.6* 35.1*  MCV 89.2 88.2  PLT 186 [id number]   Basic Metabolic Panel: Recent Labs  Lab 02/28/22 1327 03/01/22 0522  NA 139 139  K 4.6 4.0  CL 107 105  CO2 27 26  GLUCOSE 90 95  BUN 16 15  CREATININE 0.66 0.64  CALCIUM 9.2 9.0     No results found for this or any previous visit (from the past 240 hour(s)).   Radiology Studies: No results found.  Scheduled Meds:  pantoprazole (PROTONIX) IV  40 mg Intravenous Q24H   Continuous Infusions:   LOS: 0 days   Shelly Coss, MD [facility] P2/12/2022, 8:12 AM

## 2022-03-01 NOTE — Procedures (Signed)
Interventional Radiology Procedure Note  Procedure: Fluoroscopic guided gastrostomy tube replacement  Findings: Please refer to procedural dictation for full description. 18 Fr gastrostomy replaced via established track.  Complications: None immediate  Estimated Blood Loss: < 5 mL  Recommendations: Tube ready for immediate use. IR will arrange for 6 month routine exchange.   Ruthann Cancer, MD Pager: [phone]

## 2022-03-01 NOTE — Discharge Summary (Signed)
Physician Discharge Summary  Kristen [name] [id number] DOB: 02/17/1965 DOA: 02/28/2022  PCP: [name], MD  Admit date: 02/28/2022 Discharge date: 03/01/2022  Admitted From: Home Disposition:  Home  Discharge Condition:Stable CODE STATUS:FULL Diet recommendation:soft diet,tube feed diet  Brief/Interim Summary: Patient is a 57 year old female with history of allergies, iron deficiency anemia, anxiety, depression, GERD, oral cancer /sp PEG , hypertension, with accidental PEG tube dislodgment.  Hemodynamically stable on presentation.  IR consulted for PEG replacement and it has been done.  IR cleared for immediate use of PEG tube.  She is medically stable for discharge back to home today.    Following problems were addressed during the hospitalization: PEG tube dislodgment: Patient is still able to eat soft diet.  But has trouble swallowing medications.  Status post PEG tube which got dislodged and she presented to the ED. IR consulted, had PEG placement, IR cleared for immediate use .she needs to have this tube for her pills   Chronic back pain/spinal stenosis: Continue home pain medications, muscle relaxants   GERD: On PPI at home   Hypertension: Continue blood pressure monitoring.  Takes amlodipine at home.  Blood pressure stable  Discharge Diagnoses:  Principal Problem:   PEG tube malfunction ([place]) Active Problems:   Spinal stenosis of lumbar region   GERD (gastroesophageal reflux disease)   Essential hypertension    Discharge Instructions  Discharge Instructions     Diet general   Complete by: As directed    Soft diet,tube diet   Discharge instructions   Complete by: As directed    1)Follow up with your PCP 2)You will be called by IR for future follow up appointment   Increase activity slowly   Complete by: As directed       Allergies as of 03/01/2022       Reactions   Sulfa Antibiotics Hives   Tomato Hives, Swelling        Medication List      STOP taking these medications    cefpodoxime 200 MG tablet Commonly known as: VANTIN   magic mouthwash Soln   methocarbamol 500 MG tablet Commonly known as: ROBAXIN   oxyCODONE 5 MG/5ML solution Commonly known as: ROXICODONE   pregabalin 75 MG capsule Commonly known as: LYRICA       TAKE these medications    AMBULATORY NON FORMULARY MEDICATION Jevity 1.5 Sig: 6 cans per day, Bolus feeding  32m syringes #30  Tape and gauze   amLODipine 10 MG tablet Commonly known as: NORVASC Take 1 tablet (10 mg total) by mouth daily. What changed: how to take this   aspirin 81 MG chewable tablet Place 81 mg into feeding tube daily.   baclofen 10 MG tablet Commonly known as: LIORESAL TAKE 1 TABLET(10 MG) BY MOUTH THREE TIMES DAILY What changed: See the new instructions.   Calcium Citrate 200 MG Tabs Place 200 mg into feeding tube daily at 12 noon.   clotrimazole 10 MG troche Commonly known as: MYCELEX Take 1 tablet (10 mg total) by mouth 3 (three) times daily as needed (infection). What changed:  when to take this additional instructions   Dexilant 60 MG capsule Generic drug: dexlansoprazole Place 1 capsule (60 mg total) into feeding tube daily.   diclofenac 75 MG EC tablet Commonly known as: VOLTAREN TAKE 1 TABLET BY MOUTH TWICE DAILY WITH FOOD AS NEEDED FOR PAIN What changed: See the new instructions.   diphenoxylate-atropine 2.5-0.025 MG tablet Commonly known as: LOMOTIL TAKE 2 TABLETS  VIA FEEDING TUBE TWICE DAILY What changed: See the new instructions.   ferrous sulfate 220 (44 Fe) MG/5ML solution TAKE 6.8 ML VIA FEEDING TUBE EVERY DAY What changed: See the new instructions.   folic acid A999333 MCG tablet Commonly known as: FOLVITE Place 400 mcg into feeding tube daily.   lidocaine 4 % cream Commonly known as: LMX Apply 1 application  topically as needed (to affected sites- for pain).   naproxen 125 MG/5ML suspension Commonly known as: Naprosyn Take  10 mLs (250 mg total) by mouth 3 (three) times daily as needed. What changed:  when to take this reasons to take this   ondansetron 4 MG disintegrating tablet Commonly known as: ZOFRAN-ODT Take 1 tablet (4 mg total) by mouth every 8 (eight) hours as needed for nausea or vomiting. What changed:  when to take this additional instructions   potassium chloride SA 20 MEQ tablet Commonly known as: KLOR-CON M TAKE 1 TABLET BY MOUTH TWICE DAILY FOR THE NEXT 3 DAYS. OKAY TO CRUSH IF UNABLE TO SWALLOW. CAN ALSO TAKE PER TUBE IF NEEDED. What changed:  how much to take when to take this additional instructions   sertraline 25 MG tablet Commonly known as: ZOLOFT TAKE 1 TABLET(25 MG) BY MOUTH DAILY What changed:  how much to take how to take this when to take this additional instructions   Vitamin D3 10 MCG (400 UNIT) tablet Take 400 Units by mouth daily.        Allergies  Allergen Reactions   Sulfa Antibiotics Hives   Tomato Hives and Swelling    Consultations: IR   Procedures/Studies: No results found.    Subjective: Patient seen and examined at bedside today.  Hemodynamically stable for discharge  Discharge Exam: Vitals:   03/01/22 0007 03/01/22 0403  BP: 101/78 104/76  Pulse: 65 65  Resp: 16 15  Temp: 97.9 F (36.6 C) 98.1 F (36.7 C)  SpO2: 98% 97%   Vitals:   02/28/22 1603 02/28/22 2014 03/01/22 0007 03/01/22 0403  BP: (!) 120/95 111/86 101/78 104/76  Pulse: 68 66 65 65  Resp: 18 14 16 15  $ Temp: 98.2 F (36.8 C) 98 F (36.7 C) 97.9 F (36.6 C) 98.1 F (36.7 C)  TempSrc: Oral Oral Oral Oral  SpO2: 99% 99% 98% 97%  Weight:      Height:        General: Pt is alert, awake, not in acute distress Cardiovascular: RRR, S1/S2 +, no rubs, no gallops Respiratory: CTA bilaterally, no wheezing, no rhonchi Abdominal: Soft, NT, ND, bowel sounds +, PEG Extremities: no edema, no cyanosis    The results of significant diagnostics from this  hospitalization (including imaging, microbiology, ancillary and laboratory) are listed below for reference.     Microbiology: No results found for this or any previous visit (from the past 240 hour(s)).   Labs: BNP (last 3 results) No results for input(s): "BNP" in the last 8760 hours. Basic Metabolic Panel: Recent Labs  Lab 02/28/22 1327 03/01/22 0522  NA 139 139  K 4.6 4.0  CL 107 105  CO2 27 26  GLUCOSE 90 95  BUN 16 15  CREATININE 0.66 0.64  CALCIUM 9.2 9.0   Liver Function Tests: Recent Labs  Lab 03/01/22 0522  AST 18  ALT 17  ALKPHOS 87  BILITOT 0.9  PROT 7.1  ALBUMIN 4.4   No results for input(s): "LIPASE", "AMYLASE" in the last 168 hours. No results for input(s): "AMMONIA" in the last  168 hours. CBC: Recent Labs  Lab 02/28/22 1327 03/01/22 0522  WBC 4.4 3.7*  NEUTROABS 2.4  --   HGB 11.2* 11.3*  HCT 35.6* 35.1*  MCV 89.2 88.2  PLT 186 182   Cardiac Enzymes: No results for input(s): "CKTOTAL", "CKMB", "CKMBINDEX", "TROPONINI" in the last 168 hours. BNP: Invalid input(s): "POCBNP" CBG: No results for input(s): "GLUCAP" in the last 168 hours. D-Dimer No results for input(s): "DDIMER" in the last 72 hours. Hgb A1c No results for input(s): "HGBA1C" in the last 72 hours. Lipid Profile No results for input(s): "CHOL", "HDL", "LDLCALC", "TRIG", "CHOLHDL", "LDLDIRECT" in the last 72 hours. Thyroid function studies No results for input(s): "TSH", "T4TOTAL", "T3FREE", "THYROIDAB" in the last 72 hours.  Invalid input(s): "FREET3" Anemia work up No results for input(s): "VITAMINB12", "FOLATE", "FERRITIN", "TIBC", "IRON", "RETICCTPCT" in the last 72 hours. Urinalysis    Component Value Date/Time   COLORURINE AMBER (A) 08/24/2021 1830   APPEARANCEUR CLOUDY (A) 08/24/2021 1830   LABSPEC 1.013 08/24/2021 1830   PHURINE 7.0 08/24/2021 1830   GLUCOSEU NEGATIVE 08/24/2021 1830   HGBUR MODERATE (A) 08/24/2021 1830   BILIRUBINUR NEGATIVE 08/24/2021 1830    BILIRUBINUR negative 01/13/2021 0803   KETONESUR NEGATIVE 08/24/2021 1830   PROTEINUR 100 (A) 08/24/2021 1830   UROBILINOGEN 0.2 01/13/2021 0803   UROBILINOGEN 1.0 01/08/2013 0912   NITRITE POSITIVE (A) 08/24/2021 1830   LEUKOCYTESUR LARGE (A) 08/24/2021 1830   Sepsis Labs Recent Labs  Lab 02/28/22 1327 03/01/22 0522  WBC 4.4 3.7*   Microbiology No results found for this or any previous visit (from the past 240 hour(s)).  Please note: You were cared for by a hospitalist during your hospital stay. Once you are discharged, your primary care physician will handle any further medical issues. Please note that NO REFILLS for any discharge medications will be authorized once you are discharged, as it is imperative that you return to your primary care physician (or establish a relationship with a primary care physician if you do not have one) for your post hospital discharge needs so that they can reassess your need for medications and monitor your lab values.    Time coordinating discharge: 40 minutes  SIGNED:   Shelly Coss, MD  [facility] 03/01/2022, 1:51 PM Pager [phone]  If 7PM-7AM, please contact night-coverage [email or web address] Physician Discharge Summary  Kristen [name] [id number] DOB: [birth year] DOA: 02/28/2022  PCP: [name], MD  Admit date: 02/28/2022 Discharge date: 03/01/2022  Admitted From: Home Disposition:  Home  Discharge Condition:Stable CODE STATUS:FULL, DNR, Comfort Care Diet recommendation: Heart Healthy / Carb Modified / Regular / Dysphagia   Brief/Interim Summary:   Following problems were addressed during the hospitalization:   Discharge Diagnoses:  Principal Problem:   PEG tube malfunction ([place]) Active Problems:   Spinal stenosis of lumbar region   GERD (gastroesophageal reflux disease)   Essential hypertension    Discharge Instructions  Discharge Instructions     Diet general   Complete by: As  directed    Soft diet,tube diet   Discharge instructions   Complete by: As directed    1)Follow up with your PCP 2)You will be called by IR for future follow up appointment   Increase activity slowly   Complete by: As directed       Allergies as of 03/01/2022       Reactions   Sulfa Antibiotics Hives   Tomato Hives, Swelling        Medication List  STOP taking these medications    cefpodoxime 200 MG tablet Commonly known as: VANTIN   magic mouthwash Soln   methocarbamol 500 MG tablet Commonly known as: ROBAXIN   oxyCODONE 5 MG/5ML solution Commonly known as: ROXICODONE   pregabalin 75 MG capsule Commonly known as: LYRICA       TAKE these medications    AMBULATORY NON FORMULARY MEDICATION Jevity 1.5 Sig: 6 cans per day, Bolus feeding  49m syringes #30  Tape and gauze   amLODipine 10 MG tablet Commonly known as: NORVASC Take 1 tablet (10 mg total) by mouth daily. What changed: how to take this   aspirin 81 MG chewable tablet Place 81 mg into feeding tube daily.   baclofen 10 MG tablet Commonly known as: LIORESAL TAKE 1 TABLET(10 MG) BY MOUTH THREE TIMES DAILY What changed: See the new instructions.   Calcium Citrate 200 MG Tabs Place 200 mg into feeding tube daily at 12 noon.   clotrimazole 10 MG troche Commonly known as: MYCELEX Take 1 tablet (10 mg total) by mouth 3 (three) times daily as needed (infection). What changed:  when to take this additional instructions   Dexilant 60 MG capsule Generic drug: dexlansoprazole Place 1 capsule (60 mg total) into feeding tube daily.   diclofenac 75 MG EC tablet Commonly known as: VOLTAREN TAKE 1 TABLET BY MOUTH TWICE DAILY WITH FOOD AS NEEDED FOR PAIN What changed: See the new instructions.   diphenoxylate-atropine 2.5-0.025 MG tablet Commonly known as: LOMOTIL TAKE 2 TABLETS VIA FEEDING TUBE TWICE DAILY What changed: See the new instructions.   ferrous sulfate 220 (44 Fe) MG/5ML  solution TAKE 6.8 ML VIA FEEDING TUBE EVERY DAY What changed: See the new instructions.   folic acid 4A999333MCG tablet Commonly known as: FOLVITE Place 400 mcg into feeding tube daily.   lidocaine 4 % cream Commonly known as: LMX Apply 1 application  topically as needed (to affected sites- for pain).   naproxen 125 MG/5ML suspension Commonly known as: Naprosyn Take 10 mLs (250 mg total) by mouth 3 (three) times daily as needed. What changed:  when to take this reasons to take this   ondansetron 4 MG disintegrating tablet Commonly known as: ZOFRAN-ODT Take 1 tablet (4 mg total) by mouth every 8 (eight) hours as needed for nausea or vomiting. What changed:  when to take this additional instructions   potassium chloride SA 20 MEQ tablet Commonly known as: KLOR-CON M TAKE 1 TABLET BY MOUTH TWICE DAILY FOR THE NEXT 3 DAYS. OKAY TO CRUSH IF UNABLE TO SWALLOW. CAN ALSO TAKE PER TUBE IF NEEDED. What changed:  how much to take when to take this additional instructions   sertraline 25 MG tablet Commonly known as: ZOLOFT TAKE 1 TABLET(25 MG) BY MOUTH DAILY What changed:  how much to take how to take this when to take this additional instructions   Vitamin D3 10 MCG (400 UNIT) tablet Take 400 Units by mouth daily.        Allergies  Allergen Reactions   Sulfa Antibiotics Hives   Tomato Hives and Swelling    Consultations:    Procedures/Studies: No results found.    Subjective:   Discharge Exam: Vitals:   03/01/22 0007 03/01/22 0403  BP: 101/78 104/76  Pulse: 65 65  Resp: 16 15  Temp: 97.9 F (36.6 C) 98.1 F (36.7 C)  SpO2: 98% 97%   Vitals:   02/28/22 1603 02/28/22 2014 03/01/22 0007 03/01/22 0403  BP: (!) 120/95  111/86 101/78 104/76  Pulse: 68 66 65 65  Resp: 18 14 16 15  $ Temp: 98.2 F (36.8 C) 98 F (36.7 C) 97.9 F (36.6 C) 98.1 F (36.7 C)  TempSrc: Oral Oral Oral Oral  SpO2: 99% 99% 98% 97%  Weight:      Height:        General: Pt  is alert, awake, not in acute distress Cardiovascular: RRR, S1/S2 +, no rubs, no gallops Respiratory: CTA bilaterally, no wheezing, no rhonchi Abdominal: Soft, NT, ND, bowel sounds + Extremities: no edema, no cyanosis    The results of significant diagnostics from this hospitalization (including imaging, microbiology, ancillary and laboratory) are listed below for reference.     Microbiology: No results found for this or any previous visit (from the past 240 hour(s)).   Labs: BNP (last 3 results) No results for input(s): "BNP" in the last 8760 hours. Basic Metabolic Panel: Recent Labs  Lab 02/28/22 1327 03/01/22 0522  NA 139 139  K 4.6 4.0  CL 107 105  CO2 27 26  GLUCOSE 90 95  BUN 16 15  CREATININE 0.66 0.64  CALCIUM 9.2 9.0   Liver Function Tests: Recent Labs  Lab 03/01/22 0522  AST 18  ALT 17  ALKPHOS 87  BILITOT 0.9  PROT 7.1  ALBUMIN 4.4   No results for input(s): "LIPASE", "AMYLASE" in the last 168 hours. No results for input(s): "AMMONIA" in the last 168 hours. CBC: Recent Labs  Lab 02/28/22 1327 03/01/22 0522  WBC 4.4 3.7*  NEUTROABS 2.4  --   HGB 11.2* 11.3*  HCT 35.6* 35.1*  MCV 89.2 88.2  PLT 186 182   Cardiac Enzymes: No results for input(s): "CKTOTAL", "CKMB", "CKMBINDEX", "TROPONINI" in the last 168 hours. BNP: Invalid input(s): "POCBNP" CBG: No results for input(s): "GLUCAP" in the last 168 hours. D-Dimer No results for input(s): "DDIMER" in the last 72 hours. Hgb A1c No results for input(s): "HGBA1C" in the last 72 hours. Lipid Profile No results for input(s): "CHOL", "HDL", "LDLCALC", "TRIG", "CHOLHDL", "LDLDIRECT" in the last 72 hours. Thyroid function studies No results for input(s): "TSH", "T4TOTAL", "T3FREE", "THYROIDAB" in the last 72 hours.  Invalid input(s): "FREET3" Anemia work up No results for input(s): "VITAMINB12", "FOLATE", "FERRITIN", "TIBC", "IRON", "RETICCTPCT" in the last 72 hours. Urinalysis    Component  Value Date/Time   COLORURINE AMBER (A) 08/24/2021 1830   APPEARANCEUR CLOUDY (A) 08/24/2021 1830   LABSPEC 1.013 08/24/2021 1830   PHURINE 7.0 08/24/2021 1830   GLUCOSEU NEGATIVE 08/24/2021 1830   HGBUR MODERATE (A) 08/24/2021 1830   BILIRUBINUR NEGATIVE 08/24/2021 1830   BILIRUBINUR negative 01/13/2021 0803   KETONESUR NEGATIVE 08/24/2021 1830   PROTEINUR 100 (A) 08/24/2021 1830   UROBILINOGEN 0.2 01/13/2021 0803   UROBILINOGEN 1.0 01/08/2013 0912   NITRITE POSITIVE (A) 08/24/2021 1830   LEUKOCYTESUR LARGE (A) 08/24/2021 1830   Sepsis Labs Recent Labs  Lab 02/28/22 1327 03/01/22 0522  WBC 4.4 3.7*   Microbiology No results found for this or any previous visit (from the past 240 hour(s)).  Please note: You were cared for by a hospitalist during your hospital stay. Once you are discharged, your primary care physician will handle any further medical issues. Please note that NO REFILLS for any discharge medications will be authorized once you are discharged, as it is imperative that you return to your primary care physician (or establish a relationship with a primary care physician if you do not have one) for your post  hospital discharge needs so that they can reassess your need for medications and monitor your lab values.    Time coordinating discharge: 40 minutes  SIGNED:   Shelly Coss, MD  [facility] 03/01/2022, 1:51 PM Pager [phone]  If 7PM-7AM, please contact night-coverage [email or web address] Password [email or web address]

## 2022-03-05 ENCOUNTER — Other Ambulatory Visit: Payer: Self-pay | Admitting: Orthopedic Surgery

## 2022-03-08 ENCOUNTER — Telehealth: Payer: Self-pay | Admitting: Internal Medicine

## 2022-03-08 DIAGNOSIS — K219 Gastro-esophageal reflux disease without esophagitis: Secondary | ICD-10-CM

## 2022-03-08 NOTE — Telephone Encounter (Signed)
Mrs. Kristen [name] called from [place] requesting we send orders for 5 medications including Vitamin C, Dexilant, Javity and Ferrous Sulfate for feeding tube and Lomotil. Please if you have any questions at [phone] ext [phone].

## 2022-03-09 NOTE — Telephone Encounter (Signed)
Called [facility]. LM for Mrs.Singleton to fax over the refill requests for patient and we can ask Dr. Carlean Purl for his approval.

## 2022-03-10 NOTE — Telephone Encounter (Signed)
Received fax from [place].  Paper work put in Dr Celesta Aver Inbox for him to complete.

## 2022-03-11 NOTE — Telephone Encounter (Signed)
Form completed and faxed to [facility].

## 2022-03-12 MED ORDER — DIPHENOXYLATE-ATROPINE 2.5-0.025 MG PO TABS: 2.0000 | ORAL_TABLET | Freq: Two times a day (BID) | ORAL | 7 refills | Status: DC

## 2022-03-12 MED ORDER — AMBULATORY NON FORMULARY MEDICATION: 11 refills | Status: AC

## 2022-03-12 MED ORDER — CHOLECALCIFEROL 10 MCG (400 UNIT) PO TABS: 400.0000 [IU] | ORAL_TABLET | Freq: Every day | ORAL | 11 refills | Status: DC

## 2022-03-12 MED ORDER — DEXILANT 60 MG PO CPDR: 60.0000 mg | DELAYED_RELEASE_CAPSULE | Freq: Every day | ORAL | 3 refills | Status: DC

## 2022-03-12 NOTE — Telephone Encounter (Signed)
Form scanned into epic. The following rx's were refilled: amlodipine, dexilant, lomotil, ferrous sulfate, vitamin D, Jevity.

## 2022-03-14 ENCOUNTER — Encounter: Payer: Self-pay | Admitting: Internal Medicine

## 2022-03-15 ENCOUNTER — Other Ambulatory Visit: Payer: Self-pay

## 2022-03-15 DIAGNOSIS — K219 Gastro-esophageal reflux disease without esophagitis: Secondary | ICD-10-CM

## 2022-03-15 NOTE — Telephone Encounter (Signed)
I talked with Kristen [name] and told her I spoke with the [facility] and they said her insurance would cover the [place] if I changed it to a 30 day supply. She is totally out and her [facility] doesn't start for another month. [facility] said she can pick it up after 9 AM tomorrow.

## 2022-03-17 ENCOUNTER — Other Ambulatory Visit: Payer: Self-pay | Admitting: Internal Medicine

## 2022-03-18 ENCOUNTER — Telehealth: Payer: Self-pay | Admitting: Pharmacy Technician

## 2022-03-18 ENCOUNTER — Other Ambulatory Visit (HOSPITAL_COMMUNITY): Payer: Self-pay

## 2022-03-18 NOTE — Telephone Encounter (Signed)
Verify it needs to go to mail order vs local

## 2022-03-18 NOTE — Telephone Encounter (Signed)
Patient Advocate Encounter  Received notification from [facility] that prior authorization for [place] is required.   PA submitted on 2.29.24 Key [id number] Status is pending

## 2022-03-18 NOTE — Telephone Encounter (Signed)
Looks like they are asking for a new rx for this Sir.

## 2022-03-18 NOTE — Telephone Encounter (Signed)
I spoke to [facility] and she confirmed that she wants the lomotil sent to the mail order pharmacy Sir, thank you.

## 2022-04-21 ENCOUNTER — Other Ambulatory Visit (INDEPENDENT_AMBULATORY_CARE_PROVIDER_SITE_OTHER): Payer: Medicare Other

## 2022-04-21 ENCOUNTER — Encounter: Payer: Self-pay | Admitting: Orthopedic Surgery

## 2022-04-21 ENCOUNTER — Ambulatory Visit: Payer: Medicare HMO | Admitting: Orthopedic Surgery

## 2022-04-21 ENCOUNTER — Other Ambulatory Visit (INDEPENDENT_AMBULATORY_CARE_PROVIDER_SITE_OTHER): Payer: Medicare HMO

## 2022-04-21 VITALS — BP 101/68 | HR 85 | Ht 67.5 in | Wt 150.6 lb

## 2022-04-21 DIAGNOSIS — G8929 Other chronic pain: Secondary | ICD-10-CM

## 2022-04-21 DIAGNOSIS — M545 Low back pain, unspecified: Secondary | ICD-10-CM

## 2022-04-21 DIAGNOSIS — Z981 Arthrodesis status: Secondary | ICD-10-CM

## 2022-04-21 MED ORDER — NAPROXEN 125 MG/5ML PO SUSP: ORAL | 3 refills | Status: DC

## 2022-04-21 MED ORDER — ACETAMINOPHEN 500 MG PO TABS: 1000.0000 mg | ORAL_TABLET | Freq: Three times a day (TID) | ORAL | 3 refills | Status: AC | PRN

## 2022-04-21 NOTE — Progress Notes (Addendum)
Orthopedic Spine Surgery Office Note  Assessment: Patient is a 57 y.o. female with 2 issues:  1) thoracic back pain 2) right piriformis syndrome   Plan: -Explained that initially conservative treatment is tried as a significant number of patients may experience relief with these treatment modalities. Discussed that the conservative treatments include:  -activity modification  -physical therapy  -over the counter pain medications  -medrol dosepak  -lumbar steroid injections -Patient has tried Naproxen, Tylenol, activity modification  -Since she has previously gotten relief with naproxen, recommended continued usage.  She does not have any available to her so I prescribed her some today.  I also instructed her to use Tylenol -Patient should return to office in 12 weeks, x-rays at next visit: none   Patient expressed understanding of the plan and all questions were answered to the patient's satisfaction.   ___________________________________________________________________________   History: Patient is a 57 y.o. female who presents today for lumbar spine.  Patient reports that she has been doing well since her most recent pseudoarthrosis revision surgery.  She is not having any low back pain now but is having more thoracic back pain.  There is no trauma or injury that brought on the pain.  This pain started in the last 2 months.  She then developed pain and numbness/paresthesias in her right lower extremity.  She feels it on all distributions in the right lower extremity.  These right lower extremity symptoms started within the last 2 weeks.  She only notes that when she is sitting.  She gives example of driving.  It resolves when she stands.  No left lower extremity symptoms.  Weakness: Denies Symptoms of imbalance: Has chronic feelings of imbalance but does not need any assistive devices.  No recent changes Paresthesias and numbness: Yes, in all distributions in the right leg when she is  seated.  Resolves with standing. No other numbness or paresthesias Bowel or bladder incontinence: Denies Saddle anesthesia: Denies  Treatments tried: Naproxen, Tylenol, activity modification  Review of systems: Denies fevers and chills, night sweats, unexplained weight loss, history of cancer, pain that wakes them at night  Past medical history: Iron deficiency anemia Depression/anxiety GERD HTN OSA Tongue cancer Shingles  Allergies: sulfa  Past surgical history:  Partial glossectomy Cholecystectomy Left carpometacarpal suspensionplasty Modified radical neck dissection Tonsillectomy Tubal ligation Adolescent idiopathic scoliosis surgery L1-S1 PSIF, L3-S1 PLIFs Pseudarthrosis repair L3/4 and revision of posterior instrumentation   Social history: Denies use of nicotine product (smoking, vaping, patches, smokeless) Alcohol use: yes, 4 drinks per week Denies recreational drug use   Physical Exam:  General: no acute distress, appears stated age Neurologic: alert, answering questions appropriately, following commands Respiratory: unlabored breathing on room air, symmetric chest rise Psychiatric: appropriate affect, normal cadence to speech   MSK (spine):  -Strength exam      Left  Right EHL    5/5  5/5 TA    5/5  5/5 GSC    5/5  5/5 Knee extension  5/5  5/5 Hip flexion   5/5  5/5  -Sensory exam    Sensation intact to light touch in L3-S1 nerve distributions of bilateral lower extremities  -Achilles DTR: 1/4 on the left, 1/4 on the right -Patellar tendon DTR: 1/4 on the left, 1/4 on the right  -Straight leg raise: Negative bilaterally -Femoral nerve stretch test: Negative bilaterally -Clonus: no beats bilaterally  -Left hip exam: No pain through range of motion, negative FADIR, negative FABER, negative [place] -Right hip exam: No  pain through range of motion, negative FADIR, negative FABER, negative [place]field  Imaging: XR of the lumbar spine  from 04/21/2022 was independently reviewed and interpreted, 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  Lucency around the L3/4 and L4/5 interbody device is seen best on the AP.  Thoracolumbar scoliosis with apex of the curvature at T12.  There is a rod above the lumbar fusion construct at ends around T11 on the right side.  PI of 64, LL of 14.  XR of the thoracic spine from 04/21/2022 was independently reviewed and interpreted, showing a single hook part of around in the thoracic spine.  Thoracolumbar curvature with apex at T12.  No fracture seen but difficult to appreciate the lower thoracic spine as there are overlapping ribs and the lungs.  No dislocation seen.   Patient name: Kristen [name] Patient MRN: [id number] Date of visit: 04/21/22

## 2022-04-22 ENCOUNTER — Encounter: Payer: Self-pay | Admitting: Orthopedic Surgery

## 2022-04-22 MED ORDER — BACLOFEN 10 MG PO TABS: 10.0000 mg | ORAL_TABLET | Freq: Three times a day (TID) | ORAL | 2 refills | Status: DC

## 2022-04-23 ENCOUNTER — Other Ambulatory Visit: Payer: Self-pay | Admitting: Internal Medicine

## 2022-04-25 ENCOUNTER — Other Ambulatory Visit: Payer: Self-pay | Admitting: Internal Medicine

## 2022-04-25 ENCOUNTER — Other Ambulatory Visit: Payer: Self-pay | Admitting: Family Medicine

## 2022-04-25 ENCOUNTER — Other Ambulatory Visit: Payer: Self-pay | Admitting: Orthopedic Surgery

## 2022-04-25 DIAGNOSIS — I10 Essential (primary) hypertension: Secondary | ICD-10-CM

## 2022-04-25 DIAGNOSIS — F411 Generalized anxiety disorder: Secondary | ICD-10-CM

## 2022-04-25 DIAGNOSIS — F321 Major depressive disorder, single episode, moderate: Secondary | ICD-10-CM

## 2022-04-25 DIAGNOSIS — K219 Gastro-esophageal reflux disease without esophagitis: Secondary | ICD-10-CM

## 2022-05-11 NOTE — Telephone Encounter (Signed)
PA has been DENIED due to:   Dexilant is denied because it is not on your plan's Drug List (formulary). Medication authorization requires the following: (1) You need to try one (1) of these covered drugs: (a) Lansoprazole. (b) Rabeprazole. (2) OR your doctor needs to give [place] specific medical reasons why one (1) of the covered drug(s) is not appropriate for you.

## 2022-05-19 ENCOUNTER — Telehealth: Payer: Self-pay | Admitting: Orthopedic Surgery

## 2022-05-19 ENCOUNTER — Telehealth: Payer: Self-pay | Admitting: Family Medicine

## 2022-05-19 NOTE — Telephone Encounter (Signed)
[facility] called to say Pt is still missing one Rx.  potassium chloride SA (KLOR-CON) 20 MEQ tablet   Please send Rx to: [facility] - [place] - [address] Phone: [phone]  Fax: [phone]

## 2022-05-19 NOTE — Telephone Encounter (Signed)
[facility] waiting on a return fax please [phone]

## 2022-05-19 NOTE — Telephone Encounter (Signed)
Pt has not had potassium since July 2021, information faxed to number provided. Confirmation received.

## 2022-05-24 ENCOUNTER — Telehealth: Payer: Self-pay | Admitting: Family Medicine

## 2022-05-24 NOTE — Telephone Encounter (Signed)
Contacted Kimberlin L Lard to schedule their annual wellness visit. Appointment made for 06/07/22.  Rudell Cobb AWV direct phone # [phone]   Schedule change changed 5/20 appt to phone appt Pt is aware

## 2022-06-07 ENCOUNTER — Telehealth: Payer: Self-pay

## 2022-06-07 NOTE — Telephone Encounter (Signed)
Unsuccessful attempt to reach patient on preferred number listed in notes for scheduled AWV. Left message on voicemail okay to reschedule. 

## 2022-06-08 ENCOUNTER — Telehealth: Payer: Self-pay | Admitting: Orthopedic Surgery

## 2022-06-08 NOTE — Telephone Encounter (Signed)
Received call from [facility] with [facility] requesting  the Rx for Baclofen and Naproxen be sent to [facility]. The number to contact [facility] is [phone]

## 2022-06-09 MED ORDER — BACLOFEN 10 MG PO TABS: 10.0000 mg | ORAL_TABLET | Freq: Three times a day (TID) | ORAL | 2 refills | Status: DC

## 2022-06-09 MED ORDER — NAPROXEN 125 MG/5ML PO SUSP: ORAL | 3 refills | Status: DC

## 2022-06-09 NOTE — Telephone Encounter (Signed)
done

## 2022-06-10 ENCOUNTER — Ambulatory Visit: Payer: Medicare HMO | Admitting: Dermatology

## 2022-06-16 ENCOUNTER — Encounter: Payer: Self-pay | Admitting: Internal Medicine

## 2022-06-16 ENCOUNTER — Telehealth: Payer: Self-pay | Admitting: Family Medicine

## 2022-06-16 NOTE — Telephone Encounter (Signed)
[facility] - [place] - [address] Phone: [phone]  Fax: [phone]     [facility] called to request a refill of the: potassium chloride SA (KLOR-CON) 20 MEQ tablet Stating Pt is completely out.  LOV:  08/27/21  Please advise.

## 2022-06-16 NOTE — Telephone Encounter (Signed)
Pt was contacted in regard to my chart message sent. Pt was notified that our office does not deal with G tube supplies and that she will need to reach out to her PCP in regard to that.  Pt verbalized understanding with all questions answered.    

## 2022-06-16 NOTE — Telephone Encounter (Signed)
Fax sent to [facility], informing that the pt is not currently on medication. Potassium was prescribed in July 2021, as potassium levels were low and prescription was for 3 days. Most recent labs show pt does not need potassium and is not currently prescribed.

## 2022-06-16 NOTE — Telephone Encounter (Signed)
Pt was contacted in regard to my chart message sent. Pt was notified that our office does not deal with G tube supplies and that she will need to reach out to her PCP in regard to that.  Pt verbalized understanding with all questions answered.

## 2022-06-19 ENCOUNTER — Other Ambulatory Visit: Payer: Self-pay | Admitting: Internal Medicine

## 2022-06-19 ENCOUNTER — Other Ambulatory Visit: Payer: Self-pay | Admitting: Orthopedic Surgery

## 2022-07-16 ENCOUNTER — Emergency Department (HOSPITAL_COMMUNITY)
Admission: EM | Admit: 2022-07-16 | Discharge: 2022-07-16 | Disposition: A | Discharge status: Home / Self Care | Payer: Medicare Other | Attending: Emergency Medicine | Admitting: Emergency Medicine

## 2022-07-16 ENCOUNTER — Encounter (HOSPITAL_COMMUNITY): Payer: Self-pay

## 2022-07-16 ENCOUNTER — Other Ambulatory Visit: Payer: Self-pay

## 2022-07-16 DIAGNOSIS — K9423 Gastrostomy malfunction: Secondary | ICD-10-CM | POA: Insufficient documentation

## 2022-07-16 DIAGNOSIS — Z85810 Personal history of malignant neoplasm of tongue: Secondary | ICD-10-CM | POA: Insufficient documentation

## 2022-07-16 DIAGNOSIS — I10 Essential (primary) hypertension: Secondary | ICD-10-CM | POA: Insufficient documentation

## 2022-07-16 DIAGNOSIS — Z79899 Other long term (current) drug therapy: Secondary | ICD-10-CM | POA: Diagnosis not present

## 2022-07-16 DIAGNOSIS — Z7982 Long term (current) use of aspirin: Secondary | ICD-10-CM | POA: Diagnosis not present

## 2022-07-16 NOTE — Discharge Instructions (Addendum)
Your PEG tube has been replaced.  You may resume using it.  Follow-up with your doctor for further care.

## 2022-07-16 NOTE — ED Triage Notes (Signed)
Pt arrived POV. Reports she is here to get feeding tube replaced, hole in tube. Denies any abdominal pain or any other complaints at this time. AAOx4, NAD. Resp even and unlabored

## 2022-07-16 NOTE — ED Provider Notes (Signed)
[place] EMERGENCY DEPARTMENT AT [facility] Provider Note   CSN: [id number] Arrival date & time: 07/16/22  6213     History  Chief Complaint  Patient presents with   Abdominal Pain    Kristen [name] is a 57 y.o. female.  The history is provided by the patient and medical records. No language interpreter was used.  Abdominal Pain    57 year old female with extensive medical history which includes tongue cancer who has a gastrotomy tube, hypertension, GERD, depression, anemia presented to the ED requesting for feeding tube replacement.  Patient reports today while she was feeding herself she noticed food and medication contents drips out from the side of her PEG tube.  She discovered a small tear in that location.  She is here requesting for replacement of her PEG tube.  She is without any other complaint.  She last had PEG tube placed in February.  Home Medications Prior to Admission medications   Medication Sig Start Date End Date Taking? Authorizing Provider  acetaminophen (TYLENOL) 500 MG tablet Take 2 tablets (1,000 mg total) by mouth every 8 (eight) hours as needed for moderate pain or mild pain. 04/21/22   London Sheer, MD  AMBULATORY NON FORMULARY MEDICATION Jevity 1.5 Sig: 6 cans per day, Bolus feeding  60ml syringes #30  Tape and gauze 03/12/22   Iva Boop, MD  amLODipine (NORVASC) 10 MG tablet TAKE 1 TABLET BY MOUTH EVERY DAY 04/26/22   Deeann Saint, MD  aspirin 81 MG chewable tablet Place 81 mg into feeding tube daily.  10/05/16   [provider]  baclofen (LIORESAL) 10 MG tablet PLACE 1 TABLET (10 MG TOTAL) INTO FEEDING TUBE 3 (THREE) TIMES DAILY. 06/19/22 09/17/22  London Sheer, MD  Calcium Citrate 200 MG TABS Place 200 mg into feeding tube daily at 12 noon.    [provider]  cholecalciferol (VITAMIN D3) 10 MCG (400 UNIT) TABS tablet Take 1 tablet (400 Units total) by mouth daily. 03/12/22   Iva Boop, MD   clotrimazole (MYCELEX) 10 MG troche Take 1 tablet (10 mg total) by mouth 3 (three) times daily as needed (infection). Patient taking differently: Take 10 mg by mouth See admin instructions. 10 mg Per tube two times a day 10/28/20   Loa Socks, NP  dexlansoprazole (DEXILANT) 60 MG capsule ADMINISTER 1 CAPSULE BY MOUTH VIA FEEDING TUBE DAILY 04/26/22   Iva Boop, MD  diphenoxylate-atropine (LOMOTIL) 2.5-0.025 MG tablet TAKE TWO TABLETS VIA TUBE TWICE DAILY 03/19/22   Iva Boop, MD  ferrous sulfate 220 (44 Fe) MG/5ML solution TAKE 6.8ML VIA FEEDING TUBE EVERY DAY 06/21/22   Iva Boop, MD  folic acid (FOLVITE) 400 MCG tablet Place 400 mcg into feeding tube daily.    [provider]  lidocaine (LMX) 4 % cream Apply 1 application  topically as needed (to affected sites- for pain).    [provider]  naproxen (NAPROSYN) 125 MG/5ML suspension SHAKE LIQUID AND TAKE 10 ML(250 MG) BY MOUTH THREE TIMES DAILY WITH MEALS 06/09/22   London Sheer, MD  ondansetron (ZOFRAN-ODT) 4 MG disintegrating tablet Take 1 tablet (4 mg total) by mouth every 8 (eight) hours as needed for nausea or vomiting. Patient taking differently: Take 4 mg by mouth See admin instructions. 4 mg PER TUBE every 8 hours as needed for nausea or vomiting 08/24/21   Renne Crigler, PA-C  potassium chloride SA (KLOR-CON) 20 MEQ tablet TAKE 1  TABLET BY MOUTH TWICE DAILY FOR THE NEXT 3 DAYS. OKAY TO CRUSH IF UNABLE TO SWALLOW. CAN ALSO TAKE PER TUBE IF NEEDED. Patient taking differently: 20 mEq See admin instructions. 20 mEq Per tube once a day 08/13/19   Deeann Saint, MD  sertraline (ZOLOFT) 25 MG tablet TAKE 1 TABLET BY MOUTH EVERY DAY 04/26/22   Deeann Saint, MD      Allergies    Sulfa antibiotics and Tomato    Review of Systems   Review of Systems  Gastrointestinal:  Positive for abdominal pain.  All other systems reviewed and are negative.   Physical Exam Updated Vital Signs BP 97/70  (BP Location: Left Arm)   Pulse 81   Temp 98.6 F (37 C) (Oral)   Resp 18   Ht 5\' 7"  (1.702 m)   Wt 68.3 kg   LMP 11/15/2013   SpO2 95%   BMI 23.58 kg/m  Physical Exam Vitals and nursing note reviewed.  Constitutional:      General: She is not in acute distress.    Appearance: She is well-developed.  HENT:     Head: Atraumatic.  Eyes:     Conjunctiva/sclera: Conjunctivae normal.  Pulmonary:     Effort: Pulmonary effort is normal.  Abdominal:     Comments: PEG tube is in place.  There is a small tear noted to the hose oozing out some material.  No signs of infection surrounding the PEG tube site.  Abdomen is soft nontender.  Bowel sounds present.  Musculoskeletal:     Cervical back: Neck supple.  Skin:    Findings: No rash.  Neurological:     Mental Status: She is alert.  Psychiatric:        Mood and Affect: Mood normal.     ED Results / Procedures / Treatments   Labs (all labs ordered are listed, but only abnormal results are displayed) Labs Reviewed - No data to display  EKG None  Radiology No results found.  Procedures Gastrostomy tube replacement  Date/Time: 07/16/2022 9:22 PM  Performed by: Fayrene Helper, PA-C Authorized by: Fayrene Helper, PA-C  Consent: Verbal consent obtained. Consent given by: patient Patient understanding: patient states understanding of the procedure being performed Patient consent: the patient's understanding of the procedure matches consent given Patient identity confirmed: verbally with patient and arm band Time out: Immediately prior to procedure a "time out" was called to verify the correct patient, procedure, equipment, support staff and site/side marked as required. Local anesthesia used: no  Anesthesia: Local anesthesia used: no  Sedation: Patient sedated: no  Patient tolerance: patient tolerated the procedure well with no immediate complications Comments: Initial G-tube was removed after the balloon is deflated.  G-tube  was removed without any difficulty.  Another 31 French new G-tube was inserted by me and the balloon inflated to 3 cc of normal saline.  The tube is functional and patient tolerates well.         Medications Ordered in ED Medications - No data to display  ED Course/ Medical Decision Making/ A&P                             Medical Decision Making  BP 108/80   Pulse 65   Temp 97.8 F (36.6 C) (Oral)   Resp 18   Ht 5\' 7"  (1.702 m)   Wt 68.3 kg   LMP 11/15/2013   SpO2 100%  BMI 23.58 kg/m   67:69 PM 57 year old female with extensive medical history which includes tongue cancer who has a gastrotomy tube, hypertension, GERD, depression, anemia presented to the ED requesting for feeding tube replacement.  Patient reports today while she was feeding herself she noticed food and medication contents drips out from the side of her PEG tube.  She discovered a small tear in that location.  She is here requesting for replacement of her PEG tube.  She is without any other complaint.  She last had PEG tube placed in February.  On exam patient is resting comfortably appears to be in no acute discomfort.  G-tube is in place, however that as a small tear noted to the tubing oozing out some fluid.  This is an 53 French PEG tube.  I deflated the balloon and was able to withdraw the tube without any difficulty.  I inserted a new 65 French G-tube in with minimal effort, balloon was inflated by 3 cc of normal saline.  Patient tolerates the procedure well.  Tube is functional and this is an established tract therefore patient does not need any confirmation x-ray.  Patient may use her PEG tube immediately.  She is stable for discharge.  Patient voiced understanding and agrees with plan.  Labs and imaging considered but not performed.  Social determinant of health which includes food insecurity, stress, and insufficient physical activity.        Final Clinical Impression(s) / ED Diagnoses Final  diagnoses:  Leaking PEG tube [facility])    Rx / DC Orders ED Discharge Orders     None         Fayrene Helper, PA-C 07/16/22 2125    Gwyneth Sprout, MD 07/16/22 2340

## 2022-07-18 ENCOUNTER — Other Ambulatory Visit: Payer: Self-pay

## 2022-07-18 ENCOUNTER — Encounter (HOSPITAL_COMMUNITY): Payer: Self-pay

## 2022-07-18 ENCOUNTER — Observation Stay (HOSPITAL_COMMUNITY)
Admission: EM | Admit: 2022-07-18 | Discharge: 2022-07-20 | Disposition: A | Discharge status: Home / Self Care | Payer: Medicare Other | Attending: Family Medicine | Admitting: Family Medicine

## 2022-07-18 ENCOUNTER — Observation Stay (HOSPITAL_COMMUNITY): Payer: Medicare Other

## 2022-07-18 ENCOUNTER — Emergency Department (HOSPITAL_COMMUNITY): Payer: Medicare Other

## 2022-07-18 DIAGNOSIS — F32A Depression, unspecified: Secondary | ICD-10-CM | POA: Diagnosis present

## 2022-07-18 DIAGNOSIS — R42 Dizziness and giddiness: Secondary | ICD-10-CM | POA: Diagnosis present

## 2022-07-18 DIAGNOSIS — M48061 Spinal stenosis, lumbar region without neurogenic claudication: Secondary | ICD-10-CM | POA: Diagnosis not present

## 2022-07-18 DIAGNOSIS — N390 Urinary tract infection, site not specified: Secondary | ICD-10-CM | POA: Diagnosis present

## 2022-07-18 DIAGNOSIS — E876 Hypokalemia: Secondary | ICD-10-CM | POA: Insufficient documentation

## 2022-07-18 DIAGNOSIS — D72819 Decreased white blood cell count, unspecified: Secondary | ICD-10-CM | POA: Insufficient documentation

## 2022-07-18 DIAGNOSIS — E860 Dehydration: Secondary | ICD-10-CM | POA: Diagnosis not present

## 2022-07-18 DIAGNOSIS — I10 Essential (primary) hypertension: Secondary | ICD-10-CM | POA: Diagnosis present

## 2022-07-18 DIAGNOSIS — G44209 Tension-type headache, unspecified, not intractable: Secondary | ICD-10-CM | POA: Diagnosis not present

## 2022-07-18 DIAGNOSIS — N3000 Acute cystitis without hematuria: Principal | ICD-10-CM | POA: Insufficient documentation

## 2022-07-18 DIAGNOSIS — Z85810 Personal history of malignant neoplasm of tongue: Secondary | ICD-10-CM | POA: Diagnosis present

## 2022-07-18 DIAGNOSIS — Z7982 Long term (current) use of aspirin: Secondary | ICD-10-CM | POA: Diagnosis not present

## 2022-07-18 DIAGNOSIS — Z79899 Other long term (current) drug therapy: Secondary | ICD-10-CM | POA: Diagnosis not present

## 2022-07-18 DIAGNOSIS — D638 Anemia in other chronic diseases classified elsewhere: Secondary | ICD-10-CM | POA: Diagnosis not present

## 2022-07-18 DIAGNOSIS — R531 Weakness: Secondary | ICD-10-CM

## 2022-07-18 DIAGNOSIS — R519 Headache, unspecified: Secondary | ICD-10-CM

## 2022-07-18 LAB — URINALYSIS, ROUTINE W REFLEX MICROSCOPIC
Bilirubin Urine: NEGATIVE
Glucose, UA: NEGATIVE mg/dL
Hgb urine dipstick: NEGATIVE
Ketones, ur: 20 mg/dL — AB
Nitrite: POSITIVE — AB
Protein, ur: NEGATIVE mg/dL
Specific Gravity, Urine: 1.034 — ABNORMAL HIGH (ref 1.005–1.030)
pH: 6 (ref 5.0–8.0)

## 2022-07-18 LAB — BASIC METABOLIC PANEL
Anion gap: 11 (ref 5–15)
BUN: 14 mg/dL (ref 6–20)
CO2: 24 mmol/L (ref 22–32)
Calcium: 9.8 mg/dL (ref 8.9–10.3)
Chloride: 105 mmol/L (ref 98–111)
Creatinine, Ser: 0.71 mg/dL (ref 0.44–1.00)
GFR, Estimated: >60 mL/min (ref >60)
Glucose, Bld: 103 mg/dL — ABNORMAL HIGH (ref 70–99)
Potassium: 3.7 mmol/L (ref 3.5–5.1)
Sodium: 140 mmol/L (ref 135–145)

## 2022-07-18 LAB — CBC
HCT: 40.3 % (ref 36.0–46.0)
Hemoglobin: 13 g/dL (ref 12.0–15.0)
MCH: 28.1 pg (ref 26.0–34.0)
MCHC: 32.3 g/dL (ref 30.0–36.0)
MCV: 87.2 fL (ref 80.0–100.0)
Platelets: 193 10*3/uL (ref 150–400)
RBC: 4.62 MIL/uL (ref 3.87–5.11)
RDW: 14.3 % (ref 11.5–15.5)
WBC: 4.2 10*3/uL (ref 4.0–10.5)
nRBC: 0 % (ref 0.0–0.2)

## 2022-07-18 LAB — MAGNESIUM: Magnesium: 1.9 mg/dL (ref 1.7–2.4)

## 2022-07-18 LAB — CBG MONITORING, ED: Glucose-Capillary: 101 mg/dL — ABNORMAL HIGH (ref 70–99)

## 2022-07-18 MED ORDER — ACETAMINOPHEN 325 MG PO TABS
650.0000 mg | ORAL_TABLET | Freq: Four times a day (QID) | ORAL | Status: DC | PRN
  Administered 2022-07-19 – 2022-07-20 (×2): 650 mg via ORAL
  Filled 2022-07-18 (×2): qty 2

## 2022-07-18 MED ORDER — HYDROMORPHONE HCL 1 MG/ML IJ SOLN
0.5000 mg | INTRAMUSCULAR | Status: DC | PRN
  Administered 2022-07-19: 0.5 mg via INTRAVENOUS
  Filled 2022-07-18: qty 0.5

## 2022-07-18 MED ORDER — SODIUM CHLORIDE 0.9 % IV SOLN
1.0000 g | Freq: Once | INTRAVENOUS | Status: AC
  Administered 2022-07-18: 1 g via INTRAVENOUS
  Filled 2022-07-18: qty 10

## 2022-07-18 MED ORDER — FENTANYL CITRATE PF 50 MCG/ML IJ SOSY
50.0000 ug | PREFILLED_SYRINGE | Freq: Once | INTRAMUSCULAR | Status: AC
  Administered 2022-07-18: 50 ug via INTRAVENOUS
  Filled 2022-07-18: qty 1

## 2022-07-18 MED ORDER — NALOXONE HCL 0.4 MG/ML IJ SOLN: 0.4000 mg | INTRAMUSCULAR | Status: DC | PRN

## 2022-07-18 MED ORDER — SODIUM CHLORIDE 0.9 % IV SOLN
1.0000 g | INTRAVENOUS | Status: DC
  Administered 2022-07-19 – 2022-07-20 (×2): 1 g via INTRAVENOUS
  Filled 2022-07-18 (×2): qty 10

## 2022-07-18 MED ORDER — SODIUM CHLORIDE 0.9 % IV BOLUS
500.0000 mL | Freq: Once | INTRAVENOUS | Status: AC
  Administered 2022-07-18: 500 mL via INTRAVENOUS

## 2022-07-18 MED ORDER — NAPROXEN 500 MG PO TABS
250.0000 mg | ORAL_TABLET | Freq: Three times a day (TID) | ORAL | Status: DC | PRN
  Administered 2022-07-18 – 2022-07-19 (×2): 250 mg
  Filled 2022-07-18 (×2): qty 1

## 2022-07-18 MED ORDER — LACTATED RINGERS IV SOLN: INTRAVENOUS | Status: AC

## 2022-07-18 MED ORDER — NAPROXEN 125 MG/5ML PO SUSP: 250.0000 mg | Freq: Three times a day (TID) | ORAL | Status: DC | PRN

## 2022-07-18 MED ORDER — SERTRALINE HCL 50 MG PO TABS
25.0000 mg | ORAL_TABLET | Freq: Every day | ORAL | Status: DC
  Administered 2022-07-19 – 2022-07-20 (×2): 25 mg
  Filled 2022-07-18 (×2): qty 1

## 2022-07-18 MED ORDER — IOHEXOL 350 MG/ML SOLN
80.0000 mL | Freq: Once | INTRAVENOUS | Status: AC | PRN
  Administered 2022-07-18: 80 mL via INTRAVENOUS

## 2022-07-18 MED ORDER — SODIUM CHLORIDE (PF) 0.9 % IJ SOLN
INTRAMUSCULAR | Status: AC
  Filled 2022-07-18: qty 50

## 2022-07-18 MED ORDER — ASPIRIN 81 MG PO CHEW
81.0000 mg | CHEWABLE_TABLET | Freq: Every day | ORAL | Status: DC
  Administered 2022-07-19 – 2022-07-20 (×2): 81 mg
  Filled 2022-07-18 (×2): qty 1

## 2022-07-18 MED ORDER — PANTOPRAZOLE SODIUM 40 MG PO TBEC
40.0000 mg | DELAYED_RELEASE_TABLET | Freq: Every day | ORAL | Status: DC
  Filled 2022-07-18: qty 1

## 2022-07-18 MED ORDER — FENTANYL CITRATE PF 50 MCG/ML IJ SOSY: 50.0000 ug | PREFILLED_SYRINGE | INTRAMUSCULAR | Status: DC | PRN

## 2022-07-18 MED ORDER — ONDANSETRON HCL 4 MG/2ML IJ SOLN: 4.0000 mg | Freq: Four times a day (QID) | INTRAMUSCULAR | Status: DC | PRN

## 2022-07-18 MED ORDER — DIPHENOXYLATE-ATROPINE 2.5-0.025 MG PO TABS
2.0000 | ORAL_TABLET | Freq: Two times a day (BID) | ORAL | Status: DC
  Administered 2022-07-18 – 2022-07-20 (×4): 2
  Filled 2022-07-18 (×4): qty 2

## 2022-07-18 MED ORDER — ACETAMINOPHEN 650 MG RE SUPP: 650.0000 mg | Freq: Four times a day (QID) | RECTAL | Status: DC | PRN

## 2022-07-18 MED ORDER — MELATONIN 3 MG PO TABS: 3.0000 mg | ORAL_TABLET | Freq: Every evening | ORAL | Status: DC | PRN

## 2022-07-18 MED ORDER — FERROUS SULFATE 300 (60 FE) MG/5ML PO SOLN
220.0000 mg | Freq: Every day | ORAL | Status: DC
  Administered 2022-07-19 – 2022-07-20 (×2): 220 mg
  Filled 2022-07-18 (×2): qty 5

## 2022-07-18 MED ORDER — BACLOFEN 10 MG PO TABS
10.0000 mg | ORAL_TABLET | Freq: Three times a day (TID) | ORAL | Status: DC | PRN
  Administered 2022-07-18 – 2022-07-20 (×3): 10 mg
  Filled 2022-07-18 (×3): qty 1

## 2022-07-18 NOTE — ED Triage Notes (Signed)
Pt arrived via POV. C/o dizziness (spinning head), weakness, and headache since 1200 today.   Gen weakness, slower speech, but no major focal deficits seen in triage.  AOx4

## 2022-07-18 NOTE — H&P (Signed)
History and Physical      Kristen [name] DOB: 11-15-1965 DOA: 07/18/2022; DOS: 07/18/2022  PCP: [name], MD  Patient coming from: home   I have personally briefly reviewed patient's old medical records in [facility]  Chief Complaint: Generalized weakness  HPI: Kristen [name] is a 57 y.o. female with medical history significant for throat/tongue cancer status post radiation in 2018, with chronic PEG tube in place, depression, essential pretension, anemia of chronic disease associated with baseline hemoglobin 9.5-12, who is admitted to [facility] on 07/18/2022 with acute cystitis after presenting from home to [facility] ED complaining of generalized weakness.   The patient conveys 2 to 3 days of generalized weakness in the absence of any acute focal weakness.  Over that timeframe, she has noted some intermittent sharp symmetrical frontal lobe headaches, without any associated visual or oral factory aura, including no scintillating scotoma.  No acute change in vision.  Not associate with any recent nausea/vomiting.  This is also been associated with some mild dizziness, but in the absence of vertigo.  This is not resulted in any falls.  She otherwise denies any associated acute focal numbness, paresthesias, facial droop, dysarthria, expressive aphasia.   No recent subjective fever, chills, rigors, generalized myalgias.  No recent dysuria or gross hematuria.  Denies any recent chest pain, shortness of breath, palpitations, or diaphoresis.  No recent cough, abdominal pain, rash.  No recent neck stiffness.  She has a history of throat/tongue cancer, status postradiation 2018.  She has chronic PEG tube, which was most recently replaced in February 2024.  She is on bolus feedings via the PEG tube as an outpatient.    ED Course:  Vital signs in the ED were notable for the following: Afebrile; initial heart rates in the 90s, socially decreasing into the 70s to  80s following interval IV fluids, as further quantified below; blood pressure initially noted to be 97/77, subsequently increasing to 140/98 following interval IV fluids; respiratory rate 14-16, oxygen saturation 96 to 100% on room air.  Labs were notable for the following: BMP notable for the following: Sodium 140, bicarbonate 24, creatinine 0.71 relative to most recent prior serum creatinine data point of 0.64 on 03/01/2022, glucose 103.  CBC notable for will with cell count 4200, hemoglobin 13 compared to most recent prior value of 11.3 in February 2024.  Urinalysis associated with hazy appearing specimen and notable for 21-50 red blood cells, large leukocyte esterase, nitrate positive, no squamous epithelial cells, and was associate with signs of gravity of 1.034.  Urine culture collected prior to initiation of IV antibiotics.  Per my interpretation, EKG in ED demonstrated the following: Sinus rhythm with heart rate 96, no evidence of T wave or ST changes, including no evidence of ST elevation.  Imaging in the ED, per corresponding formal radiology read, was notable for the following: In further evaluating the patient's headache, CTA head showed no evidence of acute intracranial process, including evidence of intracranial hemorrhage or any evidence of acute infarct, will demonstrated no evidence of large vessel occlusion or hemodynamically significant stenosis.   While in the ED, the following were administered: Rocephin, fentanyl 50 mcg IV x 1, normal saline x 500 cc bolus.  Subsequently, the patient was admitted for further evaluation management of presenting acute cystitis complicated by generalized weakness, with clinical evidence of dehydration and suggestion of tension headache.    Review of Systems: As per HPI otherwise 10 point review of systems  negative.   Past Medical History:  Diagnosis Date   Allergy    Anemia    Iron deficiency   Anxiety    Arthritis    back, left shoulder    Blood transfusion without reported diagnosis    Chicken pox    Depression    Diarrhea    takes Imodium daily   GERD (gastroesophageal reflux disease)    H/O hiatal hernia    Headache(784.0)    History of kidney stones    1996ish   History of radiation therapy 11/16/16- 01/01/17   Base of Tongue/ 66 gy in 33 fractions/ Dose: 2 Gy   Hypertension    Iron deficiency anemia 12/11/2018   Migraine    none for 5 years (as of 01/13/16)   OSA (obstructive sleep apnea) 10/13/2015   unable to get cpap, plans to get one in 2018   Pneumonia    Restless legs    Scoliosis    Shingles 08/28/2013   Shortness of breath    with exertion   Sleep apnea    Tongue cancer (HCC)    tongue cancer    Past Surgical History:  Procedure Laterality Date   BACK SURGERY  07/21/1978   CARPOMETACARPEL SUSPENSION PLASTY Left 01/21/2021   Procedure: Left Thumb Ligament Carpometacarpal Arthroplasty;  Surgeon: Marlyne Beards, MD;  Location: [facility] OR;  Service: Orthopedics;  Laterality: Left;   CHOLECYSTECTOMY N/A 08/15/2013   Procedure: LAPAROSCOPIC CHOLECYSTECTOMY WITH INTRAOPERATIVE CHOLANGIOGRAM;  Surgeon: Cherylynn Ridges, MD;  Location: [facility] OR;  Service: General;  Laterality: N/A;   COLONOSCOPY N/A 01/09/2013   Procedure: COLONOSCOPY;  Surgeon: Theda Belfast, MD;  Location: [facility] ENDOSCOPY;  Service: Endoscopy;  Laterality: N/A;   COLONOSCOPY  03/2018   DIRECT LARYNGOSCOPY  07/2016   [facility]   ESOPHAGOGASTRODUODENOSCOPY  03/2018   ESOPHAGOGASTRODUODENOSCOPY (EGD) WITH PROPOFOL N/A 04/24/2019   Procedure: ESOPHAGOGASTRODUODENOSCOPY (EGD) WITH PROPOFOL;  Surgeon: Iva Boop, MD;  Location: [facility] ENDOSCOPY;  Service: Endoscopy;  Laterality: N/A;   GASTROSTOMY TUBE PLACEMENT  09/27/2016   GIVENS CAPSULE STUDY N/A 04/24/2019   Procedure: GIVENS CAPSULE STUDY;  Surgeon: Iva Boop, MD;  Location: [facility] ENDOSCOPY;  Service: Endoscopy;  Laterality: N/A;   HERNIA REPAIR Left 1981   IR PATIENT EVAL TECH 0-60 MINS   12/13/2016   IR PATIENT EVAL TECH 0-60 MINS  04/11/2017   IR PATIENT EVAL TECH 0-60 MINS  03/24/2018   IR PATIENT EVAL TECH 0-60 MINS  11/23/2018   IR PATIENT EVAL TECH 0-60 MINS  05/28/2019   IR REPLACE G-TUBE SIMPLE WO FLUORO  12/12/2017   IR REPLACE G-TUBE SIMPLE WO FLUORO  03/29/2018   IR REPLACE G-TUBE SIMPLE WO FLUORO  06/29/2018   IR REPLACE G-TUBE SIMPLE WO FLUORO  11/02/2018   IR REPLACE G-TUBE SIMPLE WO FLUORO  03/08/2019   IR REPLACE G-TUBE SIMPLE WO FLUORO  07/02/2019   IR REPLACE G-TUBE SIMPLE WO FLUORO  11/30/2019   IR REPLACE G-TUBE SIMPLE WO FLUORO  12/27/2019   IR REPLACE G-TUBE SIMPLE WO FLUORO  05/27/2020   IR REPLACE G-TUBE SIMPLE WO FLUORO  07/09/2020   IR REPLACE G-TUBE SIMPLE WO FLUORO  10/23/2021   IR REPLC GASTRO/COLONIC TUBE PERCUT W/FLUORO  03/01/2022   MODIFIED RADICAL NECK DISSECTION Left 09/27/2016   Levels 1 & 2   PARTIAL GLOSSECTOMY Left 09/27/2016   Left hemi partial glossectomy   SPINE SURGERY  01/20/2016   fusion   TONSILLECTOMY  tracheotomy  09/27/2016   TUBAL LIGATION  06/1988    Social History:  reports that she has never smoked. She has never used smokeless tobacco. She reports current alcohol use of about 7.0 standard drinks of alcohol per week. She reports that she does not use drugs.   Allergies  Allergen Reactions   Sulfa Antibiotics Hives   Tomato Hives and Swelling    Family History  Problem Relation Age of Onset   Heart disease Father    Heart attack Father    Hypertension Mother    Arthritis Mother    Diabetes Maternal Grandmother    Diabetes Paternal Grandmother    Diabetes Maternal Uncle    Colon polyps Neg Hx    Colon cancer Neg Hx    Esophageal cancer Neg Hx    Stomach cancer Neg Hx    Rectal cancer Neg Hx     Family history reviewed and not pertinent    Prior to Admission medications   Medication Sig Start Date End Date Taking? Authorizing Provider  acetaminophen (TYLENOL) 500 MG tablet Take 2 tablets (1,000 mg total)  by mouth every 8 (eight) hours as needed for moderate pain or mild pain. 04/21/22   London Sheer, MD  AMBULATORY NON FORMULARY MEDICATION Jevity 1.5 Sig: 6 cans per day, Bolus feeding  60ml syringes #30  Tape and gauze 03/12/22   Iva Boop, MD  amLODipine (NORVASC) 10 MG tablet TAKE 1 TABLET BY MOUTH EVERY DAY 04/26/22   Deeann Saint, MD  aspirin 81 MG chewable tablet Place 81 mg into feeding tube daily.  10/05/16   [provider]  baclofen (LIORESAL) 10 MG tablet PLACE 1 TABLET (10 MG TOTAL) INTO FEEDING TUBE 3 (THREE) TIMES DAILY. 06/19/22 09/17/22  London Sheer, MD  Calcium Citrate 200 MG TABS Place 200 mg into feeding tube daily at 12 noon.    [provider]  cholecalciferol (VITAMIN D3) 10 MCG (400 UNIT) TABS tablet Take 1 tablet (400 Units total) by mouth daily. 03/12/22   Iva Boop, MD  clotrimazole (MYCELEX) 10 MG troche Take 1 tablet (10 mg total) by mouth 3 (three) times daily as needed (infection). Patient taking differently: Take 10 mg by mouth See admin instructions. 10 mg Per tube two times a day 10/28/20   Loa Socks, NP  dexlansoprazole (DEXILANT) 60 MG capsule ADMINISTER 1 CAPSULE BY MOUTH VIA FEEDING TUBE DAILY Patient taking differently: 60 mg daily. 04/26/22   Iva Boop, MD  diphenoxylate-atropine (LOMOTIL) 2.5-0.025 MG tablet TAKE TWO TABLETS VIA TUBE TWICE DAILY 03/19/22   Iva Boop, MD  ferrous sulfate 220 (44 Fe) MG/5ML solution TAKE 6.8ML VIA FEEDING TUBE EVERY DAY 06/21/22   Iva Boop, MD  folic acid (FOLVITE) 400 MCG tablet Place 400 mcg into feeding tube daily.    [provider]  lidocaine (LMX) 4 % cream Apply 1 application  topically as needed (to affected sites- for pain).    [provider]  naproxen (NAPROSYN) 125 MG/5ML suspension SHAKE LIQUID AND TAKE 10 ML(250 MG) BY MOUTH THREE TIMES DAILY WITH MEALS Patient taking differently: Place 250 mg into feeding tube 3 (three) times daily  with meals. 06/09/22   London Sheer, MD  ondansetron (ZOFRAN-ODT) 4 MG disintegrating tablet Take 1 tablet (4 mg total) by mouth every 8 (eight) hours as needed for nausea or vomiting. Patient taking differently: Take 4 mg by mouth See admin instructions. 4 mg PER TUBE  every 8 hours as needed for nausea or vomiting 08/24/21   Renne Crigler, PA-C  potassium chloride SA (KLOR-CON) 20 MEQ tablet TAKE 1 TABLET BY MOUTH TWICE DAILY FOR THE NEXT 3 DAYS. OKAY TO CRUSH IF UNABLE TO SWALLOW. CAN ALSO TAKE PER TUBE IF NEEDED. Patient taking differently: 20 mEq See admin instructions. 20 mEq Per tube once a day 08/13/19   Deeann Saint, MD  sertraline (ZOLOFT) 25 MG tablet TAKE 1 TABLET BY MOUTH EVERY DAY 04/26/22   Deeann Saint, MD     Objective    Physical Exam: Vitals:   07/18/22 1850 07/18/22 1900 07/18/22 2000 07/18/22 2030  BP: 106/81 110/80 108/78 (!) 140/98  Pulse: 70 70 67 87  Resp: 14 15 16 16   Temp:    98.5 F (36.9 C)  TempSrc:      SpO2: 99% 100% 100% 100%  Weight:      Height:        General: appears to be stated age; alert, oriented Skin: warm, dry, no rash Head:  AT/[place] Mouth:  Oral mucosa membranes appear dry, normal dentition Neck: supple; trachea midline Heart:  RRR; did not appreciate any M/R/G Lungs: CTAB, did not appreciate any wheezes, rales, or rhonchi Abdomen: + BS; soft, ND, NT Vascular: 2+ pedal pulses b/l; 2+ radial pulses b/l Extremities: no peripheral edema, no muscle wasting Neuro: strength and sensation intact in upper and lower extremities b/l     Labs on Admission: I have personally reviewed following labs and imaging studies  CBC: Recent Labs  Lab 07/18/22 1655  WBC 4.2  HGB 13.0  HCT 40.3  MCV 87.2  PLT 193   Basic Metabolic Panel: Recent Labs  Lab 07/18/22 1655  NA 140  K 3.7  CL 105  CO2 24  GLUCOSE 103*  BUN 14  CREATININE 0.71  CALCIUM 9.8   GFR: Estimated Creatinine Clearance: 75.4 mL/min (by C-G formula based on  SCr of 0.71 mg/dL). Liver Function Tests: No results for input(s): "AST", "ALT", "ALKPHOS", "BILITOT", "PROT", "ALBUMIN" in the last 168 hours. No results for input(s): "LIPASE", "AMYLASE" in the last 168 hours. No results for input(s): "AMMONIA" in the last 168 hours. Coagulation Profile: No results for input(s): "INR", "PROTIME" in the last 168 hours. Cardiac Enzymes: No results for input(s): "CKTOTAL", "CKMB", "CKMBINDEX", "TROPONINI" in the last 168 hours. BNP (last 3 results) No results for input(s): "PROBNP" in the last 8760 hours. HbA1C: No results for input(s): "HGBA1C" in the last 72 hours. CBG: Recent Labs  Lab 07/18/22 1655  GLUCAP 101*   Lipid Profile: No results for input(s): "CHOL", "HDL", "LDLCALC", "TRIG", "CHOLHDL", "LDLDIRECT" in the last 72 hours. Thyroid Function Tests: No results for input(s): "TSH", "T4TOTAL", "FREET4", "T3FREE", "THYROIDAB" in the last 72 hours. Anemia Panel: No results for input(s): "VITAMINB12", "FOLATE", "FERRITIN", "TIBC", "IRON", "RETICCTPCT" in the last 72 hours. Urine analysis:    Component Value Date/Time   COLORURINE YELLOW 07/18/2022 2044   APPEARANCEUR HAZY (A) 07/18/2022 2044   LABSPEC 1.034 (H) 07/18/2022 2044   PHURINE 6.0 07/18/2022 2044   GLUCOSEU NEGATIVE 07/18/2022 2044   HGBUR NEGATIVE 07/18/2022 2044   BILIRUBINUR NEGATIVE 07/18/2022 2044   BILIRUBINUR negative 01/13/2021 0803   KETONESUR 20 (A) 07/18/2022 2044   PROTEINUR NEGATIVE 07/18/2022 2044   UROBILINOGEN 0.2 01/13/2021 0803   UROBILINOGEN 1.0 01/08/2013 0912   NITRITE POSITIVE (A) 07/18/2022 2044   LEUKOCYTESUR LARGE (A) 07/18/2022 2044    Radiological Exams on Admission: CT Angio  Head W or Wo Contrast  Result Date: 07/18/2022 CLINICAL DATA:  Headache, sudden, severe; dizziness, weakness EXAM: CT ANGIOGRAPHY HEAD TECHNIQUE: Multidetector CT imaging of the head was performed using the standard protocol during bolus administration of intravenous contrast.  Multiplanar CT image reconstructions and MIPs were obtained to evaluate the vascular anatomy. RADIATION DOSE REDUCTION: This exam was performed according to the departmental dose-optimization program which includes automated exposure control, adjustment of the mA and/or kV according to patient size and/or use of iterative reconstruction technique. CONTRAST:  80mL OMNIPAQUE IOHEXOL 350 MG/ML SOLN COMPARISON:  No prior CTA available, correlation is made with 07/22/2017 CT head FINDINGS: CT HEAD FINDINGS Evaluation is somewhat limited due to beam hardening artifact from the patient's calvarium. Brain: No evidence of acute infarct, hemorrhage, mass, mass effect, or midline shift. No hydrocephalus or extra-axial fluid collection. Vascular: No hyperdense vessel. Skull: Negative for fracture or focal lesion. Hyperostosis frontalis. Sinuses/Orbits: No acute finding. Other: The mastoid air cells are well aerated. CTA HEAD FINDINGS Anterior circulation: Distal extracranial ICAs are patent without significant stenosis. Both intracranial internal carotid arteries are patent to the termini, without significant stenosis. A1 segments patent, hypoplastic on the right. Normal anterior communicating artery. A2 and A3 are patent without significant stenosis, with poor visualization of more distal ACA branches, secondary to motion. No M1 stenosis or occlusion. MCA branches are somewhat irregular but perfused to their distal aspects without significant stenosis. Posterior circulation: Distal V2 and V3 segments are patent without significant stenosis. The V4 segment patent to the vertebrobasilar junction without significant stenosis. Posterior inferior cerebellar artery patent normal right. The left is not visualized. Basilar patent to its distal aspect without significant stenosis. Superior cerebellar arteries patent proximally. Patent P1 segments. PCAs are somewhat irregular but perfused to their distal aspects without significant  stenosis. The bilateral posterior communicating arteries are not visualized. Venous sinuses: Not well opacified due to arterial timing. Anatomic variants: None significant. Review of the MIP images confirms the above findings IMPRESSION: 1. No acute intracranial process. 2. No intracranial large vessel occlusion or significant stenosis. Electronically Signed   By: Wiliam Ke M.D.   On: 07/18/2022 19:35      Assessment/Plan    Principal Problem:   UTI (urinary tract infection) Active Problems:   Anemia of chronic disease   Essential hypertension   Generalized weakness   Dehydration   Tension headache   Depression   History of tongue cancer     #) Acute cystitis: In the setting of her to 3 days of generalized weakness, there is suspicion for underlying urinary tract infection in the context of a hazy appearing urinalysis with significant pyuria as well as large leukocyte esterase and nitrite positive findings, in the absence of any evidence of squamous epithelial cells to suggest a contaminated specimen.  While presentation is initially noted to be stated with tachycardia, in the absence of any objective fever or leukocytosis, SIRS criteria not currently met for sepsis.  Blood pressures have been stable, with values in the 130s 140s following initial small IV fluid bolus, as further quantified above.  Urine culture collected prior to initiation of IV Rocephin, which will be continued for empiric coverage of suspected acute cystitis.  No overt evidence of additional underlying infectious process, including no neck stiffness or additional meningeal signs to suggest underlying meningitis.  Will also check chest x-ray to further evaluate for any contributory pneumonia.  Plan: Follow-up results of urine culture.  Continue Rocephin.  Lactated Ringer's at 75  cc/h x 12 hours.  Chest x-ray.  Repeat CMP, CBC in the morning.                  #) Generalized weakness: 2 to 3-day  duration of generalized weakness, in the absence of any evidence of acute focal neurologic deficits, including no evidence of acute focal weakness to suggest acute CVA.  Additionally, today's CTA head showed no evidence of acute intracranial process, including no evidence of intracranial bleeding or any evidence of acute infarct, will CTA component showed no evidence of large vessel occlusion or hemodynamically significant stenosis.    Rather, suspect contribution from physiologic stress stemming from presenting acute cystitis, as above, as well as contribution from clinical evidence of dehydration, as further outlined below.  No e/o additional infectious process at this time, but will also check chest x-ray to further evaluate.  Will further eval for any additional contributions from endocrine/metabolic sources, as detailed below.    Plan: work-up and management of presenting acute cystitis, as described above. PT/OT consults ordered for the AM. Fall precautions. CBC in the AM. Check TSH, serum Mg level. Check B12, chest x-ray.  CMP in the morning, including following for the patient serum calcium level given use of multiple calcium elevating supplementations as an outpatient.  For now, will convert outpatient scheduled baclofen to prn.  Further evaluation management of dehydration, as further detailed below, and continuous IV fluids as outlined below.                     #) Dehydration: Clinical suspicion for such, including the appearance of dry oral mucous membranes as well as laboratory findings notable for UA demonstrating elevated specific gravity, while noting improvement in soft initial blood pressures with IV fluids as well as improvement in mild tachycardia with IV fluids, will also noting that her presenting hemoglobin is elevated outside of her baseline range, suggestive of an element of hemoconcentration consistent with dehydration.  Renal function appears to be at  baseline.  Plan: Monitor strict I's and O's.  Daily weights.  CMP in the morning. IVF's in form of lactated Ringer's at 75 cc/h x 12 hours.  Dietitian consulted for resumption of outpatient bolus feeds via PEG tube.                #) Tension headache: Intermittent symmetrical frontal lobe headaches over the last few days, without any symptoms to suggest underlying migraine.  She has had some intermittent dizziness but no evidence of additional acute focal neurologic deficits.  Presentation appears less suggestive of acute CVA, including needing CTA head performed today, which showed no evidence of acute intracranial process nor any evidence of large vessel occlusion or significant stenosis.  Less likely to be a dural venous thrombus pathology.  Does not appear to be thunderclap in nature.  Overall, patient's headache in terms of quality, distribution and associated symptoms appears most consistent with a tension of right ED headache, with potential predisposing factors include dehydration, as above.   Plan: IV fluids, as above.  Prn IV fentanyl.  Will resume outpatient naproxen, but on a as needed basis.  Repeat CMP, CBC in the morning.  Prn IV Zofran.  Monitor strict I's and O's and daily weights.                  #) Depression: documented h/o such. On Zoloft as outpatient.  No evidence of QTc prolongation on presenting EKG.  Plan: Resume Zoloft.               #)  History of throat cancer/cancer of the base of the tongue: Documented history of such, status post radiation in 2018.  Now with chronic PEG tube, which was most recently replaced in February 2024.  Per chart review, it appears that she has neck scheduled for change of PEG tube on 08/30/2022.  She undergoes bolus tube feeds via her PEG tube as an outpatient.  Plan: I have placed dietitian consult for assistance with resumption of outpatient bolus tube feeds via her PEG tube.  Monitor strict I's and O's  and daily weights.  CMP in the morning.  Add on serum magnesium level.               #) Essential Hypertension: documented h/o such, with outpatient antihypertensive regimen including amlodipine.  SBP's in the ED today: Initially soft, with initial systolic blood pressures in the 90s mmHg, which is improved into the 130s to 140s following initial IV fluids.  In the context of initial soft blood pressures and presenting underlying infection in the form of urinary tract infection, will hold home amlodipine for now.  Plan: Close monitoring of subsequent BP via routine VS. hold home amlodipine for now.                 #) Anemia of chronic disease: Documented history of such, a/w with baseline hgb range 9.5-12, with presenting hgb slightly elevated relative to this range, suggestive of an element of hemoconcentration in the context of overall clinical evidence to suggest that presentation of dehydration, as further outlined above.  No evidence of active bleed.  It is on daily iron supplementation via PEG tube as an outpatient.  Her scheduled outpatient naproxen is noted, but there does not appear to be any evidence to suggest acute GI bleed at this time.     Plan: Repeat CBC in the morning.  Resume outpatient daily iron supplementation via PEG tube.  For now, will convert her scheduled naproxen to prn.      DVT prophylaxis: SCD's   Code Status: Full code Family Communication: none Disposition Plan: Per Rounding Team Consults called: none;  Admission status: obs    I SPENT GREATER THAN 75  MINUTES IN CLINICAL CARE TIME/MEDICAL DECISION-MAKING IN COMPLETING THIS ADMISSION.     Kristen Born Jacklin Zwick DO [facility] From 7PM - 7AM   07/18/2022, 10:21 PM

## 2022-07-18 NOTE — ED Provider Notes (Signed)
[facility] AT [facility] Provider Note   CSN: [id number] Arrival date & time: 07/18/22  1625     History  Chief Complaint  Patient presents with   Dizziness   Weakness   Headache    Kristen [name] is a 57 y.o. female.  Patient is a 57 year old female who presents with dizziness and headache.  She has a history of hypertension, anemia, prior throat cancer.  She has a PEG tube in place.  She said that she was feeling okay this morning and just felt a little weak.  She suddenly started having a bad headache about 12:00 this afternoon and then felt very dizzy.  She feels both a little bit lightheaded and like the room spinning.  She denies any associated nausea or vomiting.  She feels weak all over.  She denies any unilateral weakness.  She has some difficulty with her speech at baseline due to her tongue cancer but says it is at baseline for her.  No vision deficits.  She denies any similar headaches in the past.  She does state that she has had some dizziness in the past but no headaches like this.  She denies any neck pain.  No fevers, cough, vomiting, diarrhea or other recent illnesses.  She is noted to have a little bit of a red splotchy rash on her extremities and her chest.  She does not know if this is old or new for her.       Home Medications Prior to Admission medications   Medication Sig Start Date End Date Taking? Authorizing Provider  acetaminophen (TYLENOL) 500 MG tablet Take 2 tablets (1,000 mg total) by mouth every 8 (eight) hours as needed for moderate pain or mild pain. 04/21/22   London Sheer, MD  AMBULATORY NON FORMULARY MEDICATION Jevity 1.5 Sig: 6 cans per day, Bolus feeding  60ml syringes #30  Tape and gauze 03/12/22   Iva Boop, MD  amLODipine (NORVASC) 10 MG tablet TAKE 1 TABLET BY MOUTH EVERY DAY 04/26/22   Deeann Saint, MD  aspirin 81 MG chewable tablet Place 81 mg into feeding tube daily.  10/05/16   [provider]  baclofen (LIORESAL) 10 MG tablet PLACE 1 TABLET (10 MG TOTAL) INTO FEEDING TUBE 3 (THREE) TIMES DAILY. 06/19/22 09/17/22  London Sheer, MD  Calcium Citrate 200 MG TABS Place 200 mg into feeding tube daily at 12 noon.    [provider]  cholecalciferol (VITAMIN D3) 10 MCG (400 UNIT) TABS tablet Take 1 tablet (400 Units total) by mouth daily. 03/12/22   Iva Boop, MD  clotrimazole (MYCELEX) 10 MG troche Take 1 tablet (10 mg total) by mouth 3 (three) times daily as needed (infection). Patient taking differently: Take 10 mg by mouth See admin instructions. 10 mg Per tube two times a day 10/28/20   Loa Socks, NP  dexlansoprazole (DEXILANT) 60 MG capsule ADMINISTER 1 CAPSULE BY MOUTH VIA FEEDING TUBE DAILY Patient taking differently: 60 mg daily. 04/26/22   Iva Boop, MD  diphenoxylate-atropine (LOMOTIL) 2.5-0.025 MG tablet TAKE TWO TABLETS VIA TUBE TWICE DAILY 03/19/22   Iva Boop, MD  ferrous sulfate 220 (44 Fe) MG/5ML solution TAKE 6.8ML VIA FEEDING TUBE EVERY DAY 06/21/22   Iva Boop, MD  folic acid (FOLVITE) 400 MCG tablet Place 400 mcg into feeding tube daily.    [provider]  lidocaine (LMX) 4 % cream Apply 1 application  topically as needed (to affected sites- for pain).    [provider]  naproxen (NAPROSYN) 125 MG/5ML suspension SHAKE LIQUID AND TAKE 10 ML(250 MG) BY MOUTH THREE TIMES DAILY WITH MEALS Patient taking differently: Place 250 mg into feeding tube 3 (three) times daily with meals. 06/09/22   London Sheer, MD  ondansetron (ZOFRAN-ODT) 4 MG disintegrating tablet Take 1 tablet (4 mg total) by mouth every 8 (eight) hours as needed for nausea or vomiting. Patient taking differently: Take 4 mg by mouth See admin instructions. 4 mg PER TUBE every 8 hours as needed for nausea or vomiting 08/24/21   Renne Crigler, PA-C  potassium chloride SA (KLOR-CON) 20 MEQ tablet TAKE 1 TABLET BY MOUTH TWICE DAILY FOR THE  NEXT 3 DAYS. OKAY TO CRUSH IF UNABLE TO SWALLOW. CAN ALSO TAKE PER TUBE IF NEEDED. Patient taking differently: 20 mEq See admin instructions. 20 mEq Per tube once a day 08/13/19   Deeann Saint, MD  sertraline (ZOLOFT) 25 MG tablet TAKE 1 TABLET BY MOUTH EVERY DAY 04/26/22   Deeann Saint, MD      Allergies    Sulfa antibiotics and Tomato    Review of Systems   Review of Systems  Constitutional:  Positive for fatigue. Negative for chills, diaphoresis and fever.  HENT:  Negative for congestion, rhinorrhea and sneezing.   Eyes: Negative.   Respiratory:  Negative for cough, chest tightness and shortness of breath.   Cardiovascular:  Negative for chest pain and leg swelling.  Gastrointestinal:  Negative for abdominal pain, blood in stool, diarrhea, nausea and vomiting.  Genitourinary:  Negative for difficulty urinating, flank pain, frequency and hematuria.  Musculoskeletal:  Negative for arthralgias and back pain.  Skin:  Negative for rash.  Neurological:  Positive for dizziness, weakness (Generalized) and headaches. Negative for speech difficulty and numbness.    Physical Exam Updated Vital Signs BP (!) 140/98   Pulse 87   Temp 98.5 F (36.9 C)   Resp 16   Ht 5\' 7"  (1.702 m)   Wt 68 kg   LMP 11/15/2013   SpO2 100%   BMI 23.48 kg/m  Physical Exam Constitutional:      Appearance: She is well-developed.  HENT:     Head: Normocephalic and atraumatic.  Eyes:     Extraocular Movements: Extraocular movements intact.     Pupils: Pupils are equal, round, and reactive to light.     Comments: No definite nystagmus but she has a hard time keeping her eyes open and it is difficult to definitively rule out.  Cardiovascular:     Rate and Rhythm: Normal rate and regular rhythm.     Heart sounds: Normal heart sounds.  Pulmonary:     Effort: Pulmonary effort is normal. No respiratory distress.     Breath sounds: Normal breath sounds. No wheezing or rales.  Chest:     Chest wall: No  tenderness.  Abdominal:     General: Bowel sounds are normal.     Palpations: Abdomen is soft.     Tenderness: There is no abdominal tenderness. There is no guarding or rebound.  Musculoskeletal:        General: Normal range of motion.     Cervical back: Normal range of motion and neck supple.  Lymphadenopathy:     Cervical: No cervical adenopathy.  Skin:    General: Skin is warm and dry.     Findings: No rash.  Neurological:     Mental  Status: She is alert and oriented to person, place, and time.     Comments: She has slurring of her speech but says this is baseline for her due to her prior throat cancer.  Motor 5 out of 5 all extremities, sensation grossly intact to light touch all extremities, no facial drooping, sensation intact in all nerve distributions of the face.     ED Results / Procedures / Treatments   Labs (all labs ordered are listed, but only abnormal results are displayed) Labs Reviewed  BASIC METABOLIC PANEL - Abnormal; Notable for the following components:      Result Value   Glucose, Bld 103 (*)    All other components within normal limits  URINALYSIS, ROUTINE W REFLEX MICROSCOPIC - Abnormal; Notable for the following components:   APPearance HAZY (*)    Specific Gravity, Urine 1.034 (*)    Ketones, ur 20 (*)    Nitrite POSITIVE (*)    Leukocytes,Ua LARGE (*)    Bacteria, UA RARE (*)    All other components within normal limits  CBG MONITORING, ED - Abnormal; Notable for the following components:   Glucose-Capillary 101 (*)    All other components within normal limits  URINE CULTURE  CBC    EKG EKG Interpretation Date/Time:  Sunday July 18 2022 16:39:52 EDT Ventricular Rate:  96 PR Interval:  53 QRS Duration:  102 QT Interval:  368 QTC Calculation: 465 R Axis:   -55  Text Interpretation: Sinus rhythm Short PR interval Probable left atrial enlargement LAD, consider left anterior fascicular block Abnormal R-wave progression, early transition  Nonspecific T abnormalities, diffuse leads Artifact in lead(s) I II III aVR aVL aVF V1 V2 since last tracing no significant change Confirmed by Rolan Bucco [phone]) on 07/18/2022 5:14:18 PM  Radiology CT Angio Head W or Wo Contrast  Result Date: 07/18/2022 CLINICAL DATA:  Headache, sudden, severe; dizziness, weakness EXAM: CT ANGIOGRAPHY HEAD TECHNIQUE: Multidetector CT imaging of the head was performed using the standard protocol during bolus administration of intravenous contrast. Multiplanar CT image reconstructions and MIPs were obtained to evaluate the vascular anatomy. RADIATION DOSE REDUCTION: This exam was performed according to the departmental dose-optimization program which includes automated exposure control, adjustment of the mA and/or kV according to patient size and/or use of iterative reconstruction technique. CONTRAST:  80mL OMNIPAQUE IOHEXOL 350 MG/ML SOLN COMPARISON:  No prior CTA available, correlation is made with 07/22/2017 CT head FINDINGS: CT HEAD FINDINGS Evaluation is somewhat limited due to beam hardening artifact from the patient's calvarium. Brain: No evidence of acute infarct, hemorrhage, mass, mass effect, or midline shift. No hydrocephalus or extra-axial fluid collection. Vascular: No hyperdense vessel. Skull: Negative for fracture or focal lesion. Hyperostosis frontalis. Sinuses/Orbits: No acute finding. Other: The mastoid air cells are well aerated. CTA HEAD FINDINGS Anterior circulation: Distal extracranial ICAs are patent without significant stenosis. Both intracranial internal carotid arteries are patent to the termini, without significant stenosis. A1 segments patent, hypoplastic on the right. Normal anterior communicating artery. A2 and A3 are patent without significant stenosis, with poor visualization of more distal ACA branches, secondary to motion. No M1 stenosis or occlusion. MCA branches are somewhat irregular but perfused to their distal aspects without significant  stenosis. Posterior circulation: Distal V2 and V3 segments are patent without significant stenosis. The V4 segment patent to the vertebrobasilar junction without significant stenosis. Posterior inferior cerebellar artery patent normal right. The left is not visualized. Basilar patent to its distal aspect without significant stenosis. Superior  cerebellar arteries patent proximally. Patent P1 segments. PCAs are somewhat irregular but perfused to their distal aspects without significant stenosis. The bilateral posterior communicating arteries are not visualized. Venous sinuses: Not well opacified due to arterial timing. Anatomic variants: None significant. Review of the MIP images confirms the above findings IMPRESSION: 1. No acute intracranial process. 2. No intracranial large vessel occlusion or significant stenosis. Electronically Signed   By: Wiliam Ke M.D.   On: 07/18/2022 19:35    Procedures Procedures    Medications Ordered in ED Medications  cefTRIAXone (ROCEPHIN) 1 g in sodium chloride 0.9 % 100 mL IVPB (1 g Intravenous New Bag/Given 07/18/22 2143)  fentaNYL (SUBLIMAZE) injection 50 mcg (50 mcg Intravenous Given 07/18/22 1712)  iohexol (OMNIPAQUE) 350 MG/ML injection 80 mL (80 mLs Intravenous Contrast Given 07/18/22 1753)  sodium chloride 0.9 % bolus 500 mL (0 mLs Intravenous Stopped 07/18/22 2140)    ED Course/ Medical Decision Making/ A&P                             Medical Decision Making Amount and/or Complexity of Data Reviewed Labs: ordered. Radiology: ordered.  Risk Prescription drug management. Decision regarding hospitalization.   Patient is a 57 year old female who presents with dizziness and headache.  She feels generally weak.  Given the sudden onset of the headache and dizziness, CT scan was performed.  This was a CTA which showed no evidence of intracranial hemorrhage.  No bleeding.  No masses.  No aneurysms noted.  She was given IV fluids and some pain medication for  her headache.  On reexam, she was in a sound sleep but when I woke her up she said she was feeling better.  She said her headache was better but she still felt dizzy.  Her labs are nonconcerning.  Her urinalysis does show concerns for infection.  She was given some IV Rocephin.  She was given some IV fluids.  On reassessment, she was sound asleep again.  I woke her up and she then said her head hurt and she was still feeling dizzy.  She feels overall weak and does not feel like she can ambulate.  Her mom is at bedside and says that she is much sleepier than she normally is.  I do not see any focal neurodeficits.  No other concerns for stroke.  However given her ongoing weakness in association with the urinary tract infection, will consult the hospitalist for admission.  Final Clinical Impression(s) / ED Diagnoses Final diagnoses:  Acute cystitis without hematuria  Dizziness  Acute nonintractable headache, unspecified headache type    Rx / DC Orders ED Discharge Orders     None         Rolan Bucco, MD 07/18/22 2144

## 2022-07-19 ENCOUNTER — Encounter (INDEPENDENT_AMBULATORY_CARE_PROVIDER_SITE_OTHER): Payer: Self-pay

## 2022-07-19 DIAGNOSIS — N3000 Acute cystitis without hematuria: Secondary | ICD-10-CM | POA: Diagnosis not present

## 2022-07-19 LAB — COMPREHENSIVE METABOLIC PANEL
ALT: 14 U/L (ref 0–44)
AST: 17 U/L (ref 15–41)
Albumin: 3.7 g/dL (ref 3.5–5.0)
Alkaline Phosphatase: 90 U/L (ref 38–126)
Anion gap: 9 (ref 5–15)
BUN: 12 mg/dL (ref 6–20)
CO2: 25 mmol/L (ref 22–32)
Calcium: 8.9 mg/dL (ref 8.9–10.3)
Chloride: 107 mmol/L (ref 98–111)
Creatinine, Ser: 0.63 mg/dL (ref 0.44–1.00)
GFR, Estimated: >60 mL/min (ref >60)
Glucose, Bld: 87 mg/dL (ref 70–99)
Potassium: 3.4 mmol/L — ABNORMAL LOW (ref 3.5–5.1)
Sodium: 141 mmol/L (ref 135–145)
Total Bilirubin: 0.5 mg/dL (ref 0.3–1.2)
Total Protein: 6.5 g/dL (ref 6.5–8.1)

## 2022-07-19 LAB — CBC WITH DIFFERENTIAL/PLATELET
Abs Immature Granulocytes: 0.01 10*3/uL (ref 0.00–0.07)
Basophils Absolute: 0 10*3/uL (ref 0.0–0.1)
Basophils Relative: 1 %
Eosinophils Absolute: 0.1 10*3/uL (ref 0.0–0.5)
Eosinophils Relative: 4 %
HCT: 34.8 % — ABNORMAL LOW (ref 36.0–46.0)
Hemoglobin: 11 g/dL — ABNORMAL LOW (ref 12.0–15.0)
Immature Granulocytes: 0 %
Lymphocytes Relative: 31 %
Lymphs Abs: 1.1 10*3/uL (ref 0.7–4.0)
MCH: 28.1 pg (ref 26.0–34.0)
MCHC: 31.6 g/dL (ref 30.0–36.0)
MCV: 89 fL (ref 80.0–100.0)
Monocytes Absolute: 0.3 10*3/uL (ref 0.1–1.0)
Monocytes Relative: 10 %
Neutro Abs: 1.9 10*3/uL (ref 1.7–7.7)
Neutrophils Relative %: 54 %
Platelets: 178 10*3/uL (ref 150–400)
RBC: 3.91 MIL/uL (ref 3.87–5.11)
RDW: 14.6 % (ref 11.5–15.5)
WBC: 3.5 10*3/uL — ABNORMAL LOW (ref 4.0–10.5)
nRBC: 0 % (ref 0.0–0.2)

## 2022-07-19 LAB — TSH: TSH: 0.911 u[IU]/mL (ref 0.350–4.500)

## 2022-07-19 LAB — VITAMIN B12: Vitamin B-12: 626 pg/mL (ref 180–914)

## 2022-07-19 LAB — MAGNESIUM: Magnesium: 1.9 mg/dL (ref 1.7–2.4)

## 2022-07-19 MED ORDER — ADULT MULTIVITAMIN W/MINERALS CH
1.0000 | ORAL_TABLET | Freq: Every day | ORAL | Status: DC
  Administered 2022-07-20: 1
  Filled 2022-07-19: qty 1

## 2022-07-19 MED ORDER — POTASSIUM CHLORIDE CRYS ER 20 MEQ PO TBCR: 20.0000 meq | EXTENDED_RELEASE_TABLET | Freq: Once | ORAL | Status: DC

## 2022-07-19 MED ORDER — POTASSIUM CHLORIDE 20 MEQ PO PACK
20.0000 meq | PACK | Freq: Once | ORAL | Status: AC
  Administered 2022-07-19: 20 meq
  Filled 2022-07-19: qty 1

## 2022-07-19 MED ORDER — ENSURE ENLIVE PO LIQD
237.0000 mL | Freq: Two times a day (BID) | ORAL | Status: DC
  Administered 2022-07-19: 237 mL via ORAL

## 2022-07-19 MED ORDER — JEVITY 1.5 CAL/FIBER PO LIQD
237.0000 mL | Freq: Three times a day (TID) | ORAL | Status: DC
  Administered 2022-07-19 – 2022-07-20 (×3): 237 mL
  Filled 2022-07-19 (×6): qty 237

## 2022-07-19 MED ORDER — PANTOPRAZOLE SODIUM 40 MG PO TBEC
40.0000 mg | DELAYED_RELEASE_TABLET | Freq: Every day | ORAL | Status: DC
  Administered 2022-07-19 – 2022-07-20 (×2): 40 mg via ORAL
  Filled 2022-07-19: qty 1

## 2022-07-19 NOTE — Evaluation (Signed)
Physical Therapy Evaluation Patient Details Name: Kristen [name] MRN: [id number] DOB: [birth year] Today's Date: 07/19/2022  History of Present Illness  Kristen [name] is a 57 y.o. female who presents with dizziness and headache. PMH: HTN, anemia,  throat/tongue cancer s/p radiation in 2018, chronic PEG tube in place, depression, scoliosis  Clinical Impression  Pt admitted with above diagnosis. Pt from home, multiple family members present, ind at baseline without AD and denies falls. Pt currently reports shakiness and pain in back and headache. Pt currently needing increased time with mobility, supv for transfers and amb with RW. No overt LOB, trunk flexed forward with ambulation, able to take a few steps in room without AD around furniture, reports feeling more secure with RW. Anticipate no follow up PT at discharge. Pt currently with functional limitations due to the deficits listed below (see PT Problem List). Pt will benefit from acute skilled PT to increase their independence and safety with mobility to allow discharge.          Recommendations for follow up therapy are one component of a multi-disciplinary discharge planning process, led by the attending physician.  Recommendations may be updated based on patient status, additional functional criteria and insurance authorization.  Follow Up Recommendations       Assistance Recommended at Discharge PRN  Patient can return home with the following  Assistance with cooking/housework    Equipment Recommendations Rolling walker (2 wheels)  Recommendations for Other Services       Functional Status Assessment Patient has had a recent decline in their functional status and demonstrates the ability to make significant improvements in function in a reasonable and predictable amount of time.     Precautions / Restrictions Restrictions Weight Bearing Restrictions: No      Mobility  Bed Mobility Overal bed mobility: Modified  Independent             General bed mobility comments: slightly increased time    Transfers Overall transfer level: Needs assistance Equipment used: Rolling walker (2 wheels) Transfers: Sit to/from Stand Sit to Stand: Supervision           General transfer comment: BUE assisting to power up, stands EOB for ~3 minutes due to back pain, no dizziness or unsteadiness    Ambulation/Gait Ambulation/Gait assistance: Supervision Gait Distance (Feet): 300 Feet Assistive device: Rolling walker (2 wheels) Gait Pattern/deviations: Step-through pattern, Decreased stride length Gait velocity: decreased     General Gait Details: step through gait pattern, good steadiness, equal bil step length, decreased cadence with increasead time with turns, no overt LOB  Stairs            Wheelchair Mobility    Modified Rankin (Stroke Patients Only)       Balance Overall balance assessment: No apparent balance deficits (not formally assessed)                                           Pertinent Vitals/Pain Pain Assessment Pain Assessment: 0-10 Pain Score: 6  Pain Location: headache and back Pain Descriptors / Indicators: Headache, Throbbing Pain Intervention(s): Limited activity within patient's tolerance, Monitored during session    Home Living Family/patient expects to be discharged to:: Private residence Living Arrangements: Parent (mom, brother, 2 grandsons (41 and 58 yo)) Available Help at Discharge: Family;Available 24 hours/day Type of Home: House Home Access: Level entry  Home Layout: One level Home Equipment: Grab bars - tub/shower      Prior Function Prior Level of Function : Independent/Modified Independent;Driving             Mobility Comments: pt reports ind without AD, denies falls, amb community distances ADLs Comments: pt reports ind with ADLs/IADLs     Hand Dominance   Dominant Hand: Right    Extremity/Trunk Assessment    Upper Extremity Assessment Upper Extremity Assessment: Overall WFL for tasks assessed    Lower Extremity Assessment Lower Extremity Assessment: Overall WFL for tasks assessed (AROM WFL, strength grossly 4+/5, denies numbness/tingling throughout BLE)    Cervical / Trunk Assessment Cervical / Trunk Assessment: Kyphotic  Communication   Communication: Other (comment) (affected speech due to excision of tongue)  Cognition Arousal/Alertness: Awake/alert Behavior During Therapy: WFL for tasks assessed/performed Overall Cognitive Status: Within Functional Limits for tasks assessed                                          General Comments      Exercises     Assessment/Plan    PT Assessment Patient needs continued PT services  PT Problem List Decreased activity tolerance;Decreased knowledge of use of DME;Pain       PT Treatment Interventions DME instruction;Gait training;Functional mobility training;Therapeutic activities;Therapeutic exercise;Balance training;Neuromuscular re-education;Patient/family education    PT Goals (Current goals can be found in the Care Plan section)  Acute Rehab PT Goals Patient Stated Goal: return home PT Goal Formulation: With patient Time For Goal Achievement: 08/02/22 Potential to Achieve Goals: Good    Frequency Min 1X/week     Co-evaluation               AM-PAC PT "6 Clicks" Mobility  Outcome Measure Help needed turning from your back to your side while in a flat bed without using bedrails?: None Help needed moving from lying on your back to sitting on the side of a flat bed without using bedrails?: None Help needed moving to and from a bed to a chair (including a wheelchair)?: A Little Help needed standing up from a chair using your arms (e.g., wheelchair or bedside chair)?: A Little Help needed to walk in hospital room?: A Little Help needed climbing 3-5 steps with a railing? : A Little 6 Click Score: 20     End of Session Equipment Utilized During Treatment: Gait belt Activity Tolerance: Patient tolerated treatment well Patient left: in bed;with call bell/phone within reach Nurse Communication: Mobility status PT Visit Diagnosis: Other abnormalities of gait and mobility (R26.89);Pain    Time: 1610-9604 PT Time Calculation (min) (ACUTE ONLY): 29 min   Charges:   PT Evaluation $PT Eval Low Complexity: 1 Low PT Treatments $Gait Training: 8-22 mins        Tori Aleisha Paone PT, DPT 07/19/22, 10:16 AM

## 2022-07-19 NOTE — Progress Notes (Signed)
[facility] PROGRESS NOTE  Kristen [name] (DOB: Apr 02, 1965) ZOX:[id number] PCP: Lourena Simmonds, Donald Pore, MD  Brief Narrative: Kristen [name] is a 57 y.o. female with a history of throat/tongue CA s/p XRT 2018, with chronic PEG tube in place, depression, HTN, AOCD (baseline hemoglobin 9.5-1) who presented to the ED6/30 with worsening generalized weakness and frontal headaches found to have recurrent UTI for which antibiotics have been given. Oral intake remains insufficient and she requires ongoing IVF. Urine culture remains pending.  Subjective: Still feels diffusely weak, worse in legs, typical of prior infections. Also frontal headache which is not worse with light or sound, no changes in vision.   Objective: BP 118/84 (BP Location: Right Arm)   Pulse 69   Temp 98 F (36.7 C) (Oral)   Resp 18   Ht 5\' 7"  (1.702 m)   Wt 68 kg   LMP 11/15/2013   SpO2 97%   BMI 23.48 kg/m   Gen: No distress Pulm: Clear, nonlabored  CV: RRR, no MRG GI: Soft, NT, ND, +BS. PEG site c/d/i Neuro: Alert and oriented, dysarthric at baseline. No new focal deficits. Ext: Warm, no deformities. Skin: No other rashes, lesions or ulcers on visualized skin   Assessment & Plan: Principal Problem:   UTI (urinary tract infection) Active Problems:   Anemia of chronic disease   Essential hypertension   Generalized weakness   Dehydration   Tension headache   Depression   History of tongue cancer  UTI:  - Continue ceftriaxone pending urine culture data (none available yet, "sent")  Frontal HA: Improving. No meningismus or ocular/visual abnormalities by exam. Negative neuroimaging. Granddaughter recently admitted at [facility] for cardiac arrest, pt attributes some HA to related stress. - Continue prn's  Lumbar stenosis: With paraspinal spasm, chronic pain.  - PT eval. Continue home medications including baclofen.  Hypokalemia:  - Supplement, monitor - Continue IVF, treating infection.   Depression:   - Continue SSRI  History of throat/tongue CA s/p XRT s/p PEG:  - Continue tube feeds and outpatient follow up.   HTN:  - Holding norvasc, normotensive  AOCD: Has returned to her baseline. No active bleeding noted.  Tyrone Nine, MD [facility] [email or web address] 07/19/2022, 9:32 AM

## 2022-07-19 NOTE — Progress Notes (Signed)
Initial Nutrition Assessment  DOCUMENTATION CODES:   Severe malnutrition in context of chronic illness  INTERVENTION:   -Provide 1 carton (237 ml) of Jevity 1.5 TID via PEG -Flush with 30 ml free water before and after each feeding -Provides 1065 kcals, 45g protein and 720 ml H2O (includes flushes) -Multivitamin with minerals daily via tube  -Patient able to administer feeds and flushes -Patient has been consuming fluids PO as well -Eating regular diet as tolerated. -Would like to continue only 3 cartons of Jevity daily at this time. Encouraged pt to reach out if she feels she needs tube feeds increased.  -Magic cup BID with meals, each supplement provides 290 kcal and 9 grams of protein    NUTRITION DIAGNOSIS:   Severe Malnutrition related to chronic illness, cancer and cancer related treatments as evidenced by severe fat depletion, severe muscle depletion.  GOAL:   Patient will meet greater than or equal to 90% of their needs  MONITOR:   PO intake, Labs, Weight trends, I & O's  REASON FOR ASSESSMENT:   Consult Enteral/tube feeding initiation and management  ASSESSMENT:   57 y.o. female with medical history significant for throat/tongue cancer status post radiation in 2018, with chronic PEG tube in place, depression, essential pretension, anemia of chronic disease associated with baseline hemoglobin 9.5-12, who is admitted to [facility] on 07/18/2022 with acute cystitis after presenting from home to [facility] ED complaining of generalized weakness.  Patient in room, sleeping. Reports she is feeling much better than yesterday. States she slept a lot yesterday. Pt reports she is able to eat some but over the past few days she was having to use her feeding tube more as she was not able to eat much PO. Pt states she mostly uses her tube for medication and fluids. Adjusts how much of her tube feeds she administers based on her intakes. She knows her nutritional goal is 6  cartons of Jevity 1.5 or [employer] product alternative daily. States she cannot tolerate eating if she receives 6 cartons daily as she gets too full. At this time she is eating her meals so she would like only 3 cartons of Jevity daily. Encouraged pt to let RD know if she needs this order increased or if she is unable to eat as well.  Pt received an Ensure but did not like the taste, tastes metallic to patient. Will order Magic cups for patient to try with meals.   Per weight records, no weight loss noted.  Medications: Lomotil, Ferous sulfate, KLOR-CON  Labs reviewed: Low K   NUTRITION - FOCUSED PHYSICAL EXAM:  Flowsheet Row Most Recent Value  Orbital Region Mild depletion  Upper Arm Region Moderate depletion  Thoracic and Lumbar Region Moderate depletion  Buccal Region Moderate depletion  Temple Region Severe depletion  Clavicle Bone Region Severe depletion  Clavicle and Acromion Bone Region Severe depletion  Scapular Bone Region Severe depletion  Dorsal Hand Mild depletion  Patellar Region Moderate depletion  Anterior Thigh Region Moderate depletion  Posterior Calf Region Moderate depletion  Edema (RD Assessment) None  Hair Reviewed  Eyes Reviewed  Mouth Reviewed  [missing teeth]  Skin Reviewed  Nails Reviewed       Diet Order:   Diet Order             Diet regular Room service appropriate? Yes; Fluid consistency: Thin  Diet effective now  EDUCATION NEEDS:   No education needs have been identified at this time  Skin:  Skin Assessment: Reviewed RN Assessment  Last BM:  6/30  Height:   Ht Readings from Last 1 Encounters:  07/18/22 5\' 7"  (1.702 m)    Weight:   Wt Readings from Last 1 Encounters:  07/18/22 68 kg    BMI:  Body mass index is 23.48 kg/m.  Estimated Nutritional Needs:   Kcal:  2000-2200  Protein:  95-105g  Fluid:  2L/day  Tilda Franco, MS, RD, LDN Inpatient Clinical Dietitian Contact information available via  Amion

## 2022-07-19 NOTE — Progress Notes (Signed)
Mobility Specialist - Progress Note   07/19/22 1357  Mobility  Activity Ambulated with assistance in hallway  Level of Assistance Modified independent, requires aide device or extra time  Assistive Device Front wheel walker  Distance Ambulated (ft) 275 ft  Activity Response Tolerated well  Mobility Referral Yes  $Mobility charge 1 Mobility  Mobility Specialist Start Time (ACUTE ONLY) 0147  Mobility Specialist Stop Time (ACUTE ONLY) 0156  Mobility Specialist Time Calculation (min) (ACUTE ONLY) 9 min   Pt received EOB and agreeable to mobility. No complaints during session. Pt to EOB after session with all needs met.    [facility]

## 2022-07-20 DIAGNOSIS — N3000 Acute cystitis without hematuria: Secondary | ICD-10-CM | POA: Diagnosis not present

## 2022-07-20 LAB — URINE CULTURE

## 2022-07-20 MED ORDER — AMLODIPINE BESYLATE 10 MG PO TABS
10.0000 mg | ORAL_TABLET | Freq: Every day | ORAL | Status: DC
  Administered 2022-07-20: 10 mg via ORAL
  Filled 2022-07-20: qty 1

## 2022-07-20 MED ORDER — FOLIC ACID 1 MG PO TABS
0.5000 mg | ORAL_TABLET | Freq: Every day | ORAL | Status: DC
  Administered 2022-07-20: 0.5 mg via ORAL
  Filled 2022-07-20: qty 1

## 2022-07-20 MED ORDER — FOLIC ACID 400 MCG PO TABS: 400.0000 ug | ORAL_TABLET | Freq: Every day | ORAL | Status: DC

## 2022-07-20 MED ORDER — FOSFOMYCIN TROMETHAMINE 3 G PO PACK: 3.0000 g | PACK | Freq: Once | ORAL | 0 refills | Status: AC

## 2022-07-20 NOTE — Care Plan (Signed)
AVS instructions given. Pt A&O, VSS. IV removed. Ready for discharge.

## 2022-07-20 NOTE — Evaluation (Signed)
Occupational Therapy Evaluation Patient Details Name: Kristen [name] MRN: [id number] DOB: [birth year] Today's Date: 07/20/2022   History of Present Illness Kristen [name] is a 57 y.o. female who presents with dizziness and headache. PMH: HTN, anemia,  throat/tongue cancer s/p radiation in 2018, chronic PEG tube in place, depression, scoliosis   Clinical Impression   Patient evaluated by Occupational Therapy with no further acute OT needs identified. All education has been completed and the patient has no further questions.  See below for any follow-up Occupational Therapy or equipment needs. OT is signing off. Thank you for this referral.       Recommendations for follow up therapy are one component of a multi-disciplinary discharge planning process, led by the attending physician.  Recommendations may be updated based on patient status, additional functional criteria and insurance authorization.   Assistance Recommended at Discharge PRN  Patient can return home with the following Assistance with cooking/housework    Functional Status Assessment  Patient has had a recent decline in their functional status and demonstrates the ability to make significant improvements in function in a reasonable and predictable amount of time.  Equipment Recommendations  None recommended by OT    Recommendations for Other Services       Precautions / Restrictions Precautions Precautions: None      Mobility Bed Mobility Overal bed mobility: Modified Independent             General bed mobility comments: slightly increased time    Transfers                          Balance Overall balance assessment: Modified Independent                                         ADL either performed or assessed with clinical judgement   ADL Overall ADL's : At baseline                                       General ADL Comments: Pt agreeable to ADL  assessment and able to demonstrate LE dressing, simulated hygiene, standing at sink for oral care and step over RW laid prone on floor to simulate step over tub/shower at home. Pt accomplished all tasks Independently and tub transfer Mod I for handhold on foot of bed.     Vision Baseline Vision/History: 1 Wears glasses Patient Visual Report: No change from baseline Vision Assessment?: No apparent visual deficits     Perception     Praxis      Pertinent Vitals/Pain Pain Assessment Pain Assessment: Faces Faces Pain Scale: Hurts little more Pain Location: headache and back Pain Intervention(s): Limited activity within patient's tolerance, Monitored during session, Premedicated before session     Hand Dominance     Extremity/Trunk Assessment Upper Extremity Assessment Upper Extremity Assessment: Overall WFL for tasks assessed   Lower Extremity Assessment Lower Extremity Assessment: Overall WFL for tasks assessed       Communication Communication Communication:  (h/o tongue excision with expectred speechy differences)   Cognition Arousal/Alertness: Awake/alert Behavior During Therapy: WFL for tasks assessed/performed Overall Cognitive Status: Within Functional Limits for tasks assessed  General Comments       Exercises     Shoulder Instructions      Home Living Family/patient expects to be discharged to:: Private residence Living Arrangements: Parent Available Help at Discharge: Family;Available 24 hours/day Type of Home: House Home Access: Level entry     Home Layout: One level     Bathroom Shower/Tub: Chief Strategy Officer: Standard     Home Equipment: Grab bars - tub/shower          Prior Functioning/Environment Prior Level of Function : Independent/Modified Independent;Driving             Mobility Comments: pt reports ind without AD, denies falls, amb community distances ADLs  Comments: pt reports ind with ADLs/IADLs        OT Problem List: Pain      OT Treatment/Interventions:      OT Goals(Current goals can be found in the care plan section) Acute Rehab OT Goals OT Goal Formulation: All assessment and education complete, DC therapy (Defer to PT for balance/gait training) Potential to Achieve Goals: Good  OT Frequency:      Co-evaluation              AM-PAC OT "6 Clicks" Daily Activity     Outcome Measure Help from another person eating meals?: None Help from another person taking care of personal grooming?: None Help from another person toileting, which includes using toliet, bedpan, or urinal?: None Help from another person bathing (including washing, rinsing, drying)?: None Help from another person to put on and taking off regular upper body clothing?: None Help from another person to put on and taking off regular lower body clothing?: None 6 Click Score: 24   End of Session Equipment Utilized During Treatment:  (None) Nurse Communication: Other (comment) (No OT needs)  Activity Tolerance:   Patient left: in bed;with call bell/phone within reach  OT Visit Diagnosis: Pain;Feeding difficulties (R63.3) Pain - part of body:  (back and head)                Time: 1610-9604 OT Time Calculation (min): 12 min Charges:  OT General Charges $OT Visit: 1 Visit OT Evaluation $OT Eval Low Complexity: 1 Low  Kristen [name], OT Acute Rehab Services Office: [phone] 07/20/2022   Kristen [name] 07/20/2022, 10:21 AM

## 2022-07-20 NOTE — Care Management Obs Status (Signed)
MEDICARE OBSERVATION STATUS NOTIFICATION   Patient Details  Name: Kristen [name] MRN: [id number] Date of Birth: 1965-03-17   Medicare Observation Status Notification Given:  Yes    Beckie Busing, RN 07/20/2022, 1:58 PM

## 2022-07-20 NOTE — Discharge Summary (Signed)
Physician Discharge Summary   Patient: Kristen [name] MRN: [id number] DOB: [birth year]  Admit date:     07/18/2022  Discharge date: 07/20/22  Discharge Physician: Tyrone Nine   PCP: [name], MD   Recommendations at discharge:  Follow up with PCP in 1-2 weeks, suggest recheck BMP, CBC.  Discharge Diagnoses: Principal Problem:   UTI (urinary tract infection) Active Problems:   Anemia of chronic disease   Essential hypertension   Generalized weakness   Dehydration   Tension headache   Depression   History of tongue cancer  Hospital Course: Kristen [name] is a 57 y.o. female with a history of throat/tongue CA s/p XRT 2018, with chronic PEG tube in place, depression, HTN, AOCD (baseline hemoglobin 9.5-1) who presented to the ED6/30 with worsening generalized weakness and frontal headaches found to have recurrent UTI for which antibiotics have been given. Oral intake and generalized weakness slowly improved with antibiotics and IVF. She has done quite well with therapy on 7/2 and feels ready to continue antibiotics at home. Please see details as outlined below.    Assessment and Plan: UTI:  - Received ceftriaxone x3 doses pending urine culture data which is unfortunately nonclonal. Prior isolates of Enterobacter have been sensitive to CTX and the patient remains afebrile with improving symptoms. Will complete course with fosfomycin on the day after discharge.   Frontal HA: Improving. No meningismus or ocular/visual abnormalities by exam. Negative neuroimaging. Granddaughter recently admitted at [facility] for cardiac arrest, pt attributes some HA to related stress. - Continue prn's. Has continued improving with treatment of infection.   Lumbar stenosis: With paraspinal spasm, chronic pain.  - Has "Graduated" from PT and OT since arrival here. Does not need any follow up therapy. Continue home medications including baclofen.   Hypokalemia:  - Supplemented, consider recheck  at follow up   Depression:  - Continue SSRI   History of throat/tongue CA s/p XRT s/p PEG:  - Continue tube feeds and outpatient follow up.    HTN:  - Continue home norvasc   AOCD: Has returned to her baseline. No active bleeding noted.  Leukopenia: Mild with normal Diff.  - Consider recheck at follow up.  Severe protein calorie malnutrition: Continue protein supplementation thru TFs.   Consultants: None Procedures performed: None  Disposition: Home Diet recommendation: As tolerated, tube feeds. DISCHARGE MEDICATION: Allergies as of 07/20/2022       Reactions   Sulfa Antibiotics Hives   Tomato Hives, Swelling        Medication List     TAKE these medications    acetaminophen 500 MG tablet Commonly known as: TYLENOL Take 2 tablets (1,000 mg total) by mouth every 8 (eight) hours as needed for moderate pain or mild pain.   AMBULATORY NON FORMULARY MEDICATION Jevity 1.5 Sig: 6 cans per day, Bolus feeding  60ml syringes #30  Tape and gauze   amLODipine 10 MG tablet Commonly known as: NORVASC TAKE 1 TABLET BY MOUTH EVERY DAY   aspirin 81 MG chewable tablet Place 81 mg into feeding tube daily.   baclofen 10 MG tablet Commonly known as: LIORESAL PLACE 1 TABLET (10 MG TOTAL) INTO FEEDING TUBE 3 (THREE) TIMES DAILY.   Calcium Citrate 200 MG Tabs Place 200 mg into feeding tube daily at 12 noon.   cholecalciferol 10 MCG (400 UNIT) Tabs tablet Commonly known as: VITAMIN D3 Take 1 tablet (400 Units total) by mouth daily.   clotrimazole 10 MG troche Commonly known as:  MYCELEX Take 1 tablet (10 mg total) by mouth 3 (three) times daily as needed (infection). What changed:  when to take this additional instructions   dexlansoprazole 60 MG capsule Commonly known as: DEXILANT ADMINISTER 1 CAPSULE BY MOUTH VIA FEEDING TUBE DAILY What changed: See the new instructions.   diphenoxylate-atropine 2.5-0.025 MG tablet Commonly known as: LOMOTIL TAKE TWO TABLETS VIA  TUBE TWICE DAILY What changed: See the new instructions.   ferrous sulfate 220 (44 Fe) MG/5ML solution TAKE 6.8ML VIA FEEDING TUBE EVERY DAY What changed: See the new instructions.   folic acid 400 MCG tablet Commonly known as: FOLVITE Place 400 mcg into feeding tube daily.   fosfomycin 3 g Pack Commonly known as: MONUROL Take 3 g by mouth once for 1 dose. Start taking on: July 21, 2022   lidocaine 4 % cream Commonly known as: LMX Apply 1 application  topically as needed (to affected sites- for pain).   naproxen 125 MG/5ML suspension Commonly known as: NAPROSYN SHAKE LIQUID AND TAKE 10 ML(250 MG) BY MOUTH THREE TIMES DAILY WITH MEALS What changed:  how much to take how to take this when to take this additional instructions   ondansetron 4 MG disintegrating tablet Commonly known as: ZOFRAN-ODT Take 1 tablet (4 mg total) by mouth every 8 (eight) hours as needed for nausea or vomiting. What changed:  when to take this additional instructions   potassium chloride SA 20 MEQ tablet Commonly known as: KLOR-CON M TAKE 1 TABLET BY MOUTH TWICE DAILY FOR THE NEXT 3 DAYS. OKAY TO CRUSH IF UNABLE TO SWALLOW. CAN ALSO TAKE PER TUBE IF NEEDED. What changed:  how much to take when to take this additional instructions   sertraline 25 MG tablet Commonly known as: ZOLOFT TAKE 1 TABLET BY MOUTH EVERY DAY What changed:  how much to take how to take this when to take this        Follow-up Information     [name], MD Follow up.   Specialty: Family Medicine Contact information: [address] [place] [address] [phone]                Discharge Exam: Filed Weights   07/18/22 1642 07/20/22 0536  Weight: 68 kg 70.4 kg  BP (!) 119/95 (BP Location: Right Arm)   Pulse 70   Temp 98.5 F (36.9 C) (Oral)   Resp 17   Ht 5\' 7"  (1.702 m)   Wt 70.4 kg   LMP 11/15/2013   SpO2 100%   BMI 24.31 kg/m   No distress, pleasant and  well-appearing, thin female Clear, nonlabored RRR, no MRG Soft, NT, ND, PEG site c/d/i Alert, oriented, no focal deficits.  Condition at discharge: good  The results of significant diagnostics from this hospitalization (including imaging, microbiology, ancillary and laboratory) are listed below for reference.   Imaging Studies: DG Chest Port 1 View  Result Date: 07/18/2022 CLINICAL DATA:  Dizziness headache EXAM: PORTABLE CHEST 1 VIEW COMPARISON:  04/21/2021 FINDINGS: No acute airspace disease. Normal cardiomediastinal silhouette. No pneumothorax. Thoracolumbar spinal rod. IMPRESSION: No active disease. Electronically Signed   By: Jasmine Pang M.D.   On: 07/18/2022 22:37   CT Angio Head W or Wo Contrast  Result Date: 07/18/2022 CLINICAL DATA:  Headache, sudden, severe; dizziness, weakness EXAM: CT ANGIOGRAPHY HEAD TECHNIQUE: Multidetector CT imaging of the head was performed using the standard protocol during bolus administration of intravenous contrast. Multiplanar CT image reconstructions and MIPs were obtained to  evaluate the vascular anatomy. RADIATION DOSE REDUCTION: This exam was performed according to the departmental dose-optimization program which includes automated exposure control, adjustment of the mA and/or kV according to patient size and/or use of iterative reconstruction technique. CONTRAST:  80mL OMNIPAQUE IOHEXOL 350 MG/ML SOLN COMPARISON:  No prior CTA available, correlation is made with 07/22/2017 CT head FINDINGS: CT HEAD FINDINGS Evaluation is somewhat limited due to beam hardening artifact from the patient's calvarium. Brain: No evidence of acute infarct, hemorrhage, mass, mass effect, or midline shift. No hydrocephalus or extra-axial fluid collection. Vascular: No hyperdense vessel. Skull: Negative for fracture or focal lesion. Hyperostosis frontalis. Sinuses/Orbits: No acute finding. Other: The mastoid air cells are well aerated. CTA HEAD FINDINGS Anterior circulation:  Distal extracranial ICAs are patent without significant stenosis. Both intracranial internal carotid arteries are patent to the termini, without significant stenosis. A1 segments patent, hypoplastic on the right. Normal anterior communicating artery. A2 and A3 are patent without significant stenosis, with poor visualization of more distal ACA branches, secondary to motion. No M1 stenosis or occlusion. MCA branches are somewhat irregular but perfused to their distal aspects without significant stenosis. Posterior circulation: Distal V2 and V3 segments are patent without significant stenosis. The V4 segment patent to the vertebrobasilar junction without significant stenosis. Posterior inferior cerebellar artery patent normal right. The left is not visualized. Basilar patent to its distal aspect without significant stenosis. Superior cerebellar arteries patent proximally. Patent P1 segments. PCAs are somewhat irregular but perfused to their distal aspects without significant stenosis. The bilateral posterior communicating arteries are not visualized. Venous sinuses: Not well opacified due to arterial timing. Anatomic variants: None significant. Review of the MIP images confirms the above findings IMPRESSION: 1. No acute intracranial process. 2. No intracranial large vessel occlusion or significant stenosis. Electronically Signed   By: Wiliam Ke M.D.   On: 07/18/2022 19:35    Microbiology: Results for orders placed or performed during the hospital encounter of 07/18/22  Urine Culture     Status: Abnormal   Collection Time: 07/18/22 10:11 PM   Specimen: Urine, Clean Catch  Result Value Ref Range Status   Specimen Description   Final    URINE, CLEAN CATCH Performed at [facility], [address] [place], [place] [address]    Special Requests   Final    NONE Performed at [facility], [address], [place], [place] [address]    Culture MULTIPLE SPECIES PRESENT,  SUGGEST RECOLLECTION (A)  Final   Report Status 07/20/2022 FINAL  Final   *Note: Due to a large number of results and/or encounters for the requested time period, some results have not been displayed. A complete set of results can be found in Results Review.    Labs: CBC: Recent Labs  Lab 07/18/22 1655 07/19/22 0452  WBC 4.2 3.5*  NEUTROABS  --  1.9  HGB 13.0 11.0*  HCT 40.3 34.8*  MCV 87.2 89.0  PLT 193 178   Basic Metabolic Panel: Recent Labs  Lab 07/18/22 1655 07/18/22 2315 07/19/22 0452  NA 140  --  141  K 3.7  --  3.4*  CL 105  --  107  CO2 24  --  25  GLUCOSE 103*  --  87  BUN 14  --  12  CREATININE 0.71  --  0.63  CALCIUM 9.8  --  8.9  MG  --  1.9 1.9   Liver Function Tests: Recent Labs  Lab 07/19/22 0452  AST  17  ALT 14  ALKPHOS 90  BILITOT 0.5  PROT 6.5  ALBUMIN 3.7   CBG: Recent Labs  Lab 07/18/22 1655  GLUCAP 101*    Discharge time spent: greater than 30 minutes.  Signed: Tyrone Nine, MD [facility] 07/20/2022

## 2022-07-20 NOTE — Progress Notes (Signed)
Chaplain engaged in an initial visit with Kristen [name]. She voiced that she had done an Advanced Directive 6 yeas ago but wasn't sure if it was in the system. Chaplain found documents under ACP documents and printed out copies for her. [name] also voiced that she wants some therapeutic resources. Chaplain will check in with case management for resources.    07/20/22 1500  Spiritual Encounters  Type of Visit Initial  Care provided to: Patient  Reason for visit Advance directives

## 2022-07-21 ENCOUNTER — Ambulatory Visit (INDEPENDENT_AMBULATORY_CARE_PROVIDER_SITE_OTHER): Payer: Medicare Other | Admitting: Orthopedic Surgery

## 2022-07-21 DIAGNOSIS — G8929 Other chronic pain: Secondary | ICD-10-CM

## 2022-07-21 DIAGNOSIS — M546 Pain in thoracic spine: Secondary | ICD-10-CM | POA: Diagnosis not present

## 2022-07-21 NOTE — Progress Notes (Signed)
Orthopedic Spine Surgery Office Note   Assessment: Patient is a 57 y.o. female with acute worsening of her chronic thoracic back pain which is improving     Plan: -Patient has tried Naproxen, Tylenol, activity modification  -Her acute worsening of pain has improved significantly within the last couple of days, so recommended monitoring. I told her if it gets worse or does not get back to her baseline level of pain to come back to the office sooner than our regularly scheduled follow up -She has a long history of using naproxen and tylenol to control her pain. I told her she can keep doing that and if she needs refills, I am can do that for her -Patient should return to office in 12 weeks, x-rays at next visit: none     Patient expressed understanding of the plan and all questions were answered to the patient's satisfaction.    ___________________________________________________________________________     History: Patient is a 57 y.o. female who presents today for thoracic back pain. She has a history of thoracic back pain, but noticed acute worsening within the last couple of weeks. She said it started around the time that she developed a UTI. There was no trauma or injury around that time. She said she was admitted to the hospital this past week and just got discharged. She has noticed as she was getting treated for the UTI that her back pain has improved significantly. She is now near her baseline level of pain. She is not having any pain radiate into her lower extremities. No saddle anesthesia or bowel or bladder incontinence.    Treatments tried: Naproxen, Tylenol, activity modification     Physical Exam:   General: no acute distress, appears stated age Neurologic: alert, answering questions appropriately, following commands Respiratory: unlabored breathing on room air, symmetric chest rise Psychiatric: appropriate affect, normal cadence to speech     MSK (spine):   -Strength  exam                                                   Left                  Right EHL                              5/5                  5/5 TA                                 5/5                  5/5 GSC                             5/5                  5/5 Knee extension            5/5                  5/5 Hip flexion  5/5                  5/5   -Sensory exam                           Sensation intact to light touch in L3-S1 nerve distributions of bilateral lower extremities   -Achilles DTR: 1/4 on the left, 1/4 on the right -Patellar tendon DTR: 1/4 on the left, 1/4 on the right   -Straight leg raise: Negative bilaterally -Femoral nerve stretch test: Negative bilaterally -Clonus: no beats bilaterally   -Left hip exam: No pain through range of motion, negative FADIR, negative FABER, negative Stinchfield -Right hip exam: No pain through range of motion, negative FADIR, negative FABER, negative Stinchfield   Imaging: XR of the lumbar spine from 04/21/2022 was previously independently reviewed and interpreted, 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  Lucency around the L3/4 and L4/5 interbody device is seen best on the AP.  Thoracolumbar scoliosis with apex of the curvature at T12.  There is a rod above the lumbar fusion construct at ends around T11 on the right side.  PI of 64, LL of 14.   XR of the thoracic spine from/03/2022 was previously independently reviewed and interpreted, showing a single hook part of around in the thoracic spine.  Thoracolumbar curvature with apex at T12.  No fracture seen but difficult to appreciate the lower thoracic spine as there are overlapping ribs and the lungs.  No dislocation seen.     Patient name: Kristen [name] Patient MRN: [id number] Date of visit: 07/21/22

## 2022-08-16 ENCOUNTER — Other Ambulatory Visit: Payer: Self-pay | Admitting: Internal Medicine

## 2022-08-19 ENCOUNTER — Ambulatory Visit (HOSPITAL_COMMUNITY)
Admission: RE | Admit: 2022-08-19 | Discharge: 2022-08-19 | Disposition: A | Discharge status: Home / Self Care | Payer: Medicare Other | Source: Ambulatory Visit | Attending: Radiology | Admitting: Radiology

## 2022-08-19 ENCOUNTER — Encounter (HOSPITAL_COMMUNITY): Payer: Self-pay

## 2022-08-19 DIAGNOSIS — Z431 Encounter for attention to gastrostomy: Secondary | ICD-10-CM | POA: Insufficient documentation

## 2022-08-19 HISTORY — PX: IR REPLACE G-TUBE SIMPLE WO FLUORO: IMG2323

## 2022-08-19 NOTE — Procedures (Signed)
Patient seen for DFO of Gastrostomy tube.  Site was assessed and 33F gastrostomy tube was successfully replaced through current gastrostomy tube site, balloon was inflated, bumper was cinched down to skin.  Gastrostomy tube was flushed successfully and patient was sent home with needed Gastrostomy tube feeding supplies and told to call with any questions or concerns.

## 2022-08-30 ENCOUNTER — Other Ambulatory Visit (HOSPITAL_COMMUNITY): Payer: Medicare Other

## 2022-09-02 ENCOUNTER — Telehealth: Payer: Self-pay | Admitting: Orthopedic Surgery

## 2022-09-02 MED ORDER — BACLOFEN 10 MG PO TABS: 10.0000 mg | ORAL_TABLET | Freq: Three times a day (TID) | ORAL | 0 refills | Status: DC

## 2022-09-02 MED ORDER — NAPROXEN 125 MG/5ML PO SUSP: 250.0000 mg | Freq: Three times a day (TID) | ORAL | 2 refills | Status: AC

## 2022-09-02 NOTE — Telephone Encounter (Signed)
[facility] called. Need a RX for refills for lioresal and naprosyn.

## 2022-09-06 ENCOUNTER — Telehealth: Payer: Self-pay | Admitting: Internal Medicine

## 2022-09-06 DIAGNOSIS — K219 Gastro-esophageal reflux disease without esophagitis: Secondary | ICD-10-CM

## 2022-09-06 MED ORDER — FERROUS SULFATE 220 (44 FE) MG/5ML PO SOLN: ORAL | 1 refills | Status: DC

## 2022-09-06 MED ORDER — DEXLANSOPRAZOLE 60 MG PO CPDR
DELAYED_RELEASE_CAPSULE | ORAL | 1 refills | Status: DC
Start: 2022-09-06 — End: 2022-11-16

## 2022-09-06 NOTE — Telephone Encounter (Signed)
Dexilant and ferrous sulfate refills sent in to mail order pharmacy as requested.

## 2022-09-06 NOTE — Telephone Encounter (Signed)
[facility] medication refill for dexillant 60 mg and also Ferrous sulfate. Please advise.

## 2022-09-19 ENCOUNTER — Other Ambulatory Visit: Payer: Self-pay | Admitting: Orthopedic Surgery

## 2022-09-28 ENCOUNTER — Ambulatory Visit: Payer: Medicare Other | Admitting: Dermatology

## 2022-10-27 ENCOUNTER — Ambulatory Visit: Payer: Medicare Other | Admitting: Orthopedic Surgery

## 2022-10-27 DIAGNOSIS — M546 Pain in thoracic spine: Secondary | ICD-10-CM | POA: Diagnosis not present

## 2022-10-27 DIAGNOSIS — G8929 Other chronic pain: Secondary | ICD-10-CM | POA: Diagnosis not present

## 2022-10-27 NOTE — Progress Notes (Signed)
Orthopedic Spine Surgery Office Note   Assessment: Patient is a 57 y.o. female with thoracic back pain that is currently under control     Plan: -Patient has tried Naproxen, Tylenol, activity modification  -She has been getting good relief with naproxen and Tylenol, so I told her I would continue to refill those as needed -Activity as tolerated, no spine specific precautions -Patient should return to office in 6 months, x-rays at next visit: AP/lateral thoracic     Patient expressed understanding of the plan and all questions were answered to the patient's satisfaction.    ___________________________________________________________________________     History: Patient is a 57 y.o. female who presents today for follow-up on her thoracic spine.  She states she has been doing well since she was last seen in the office.  She is having mild pain in her thoracic spine but is not having any other pain.  She states that the naproxen and Tylenol have been helpful.  She can do everything she wants with her current amount of pain.  She has had 2 episodes where her leg had gone numb.  She noticed that when she was sitting for a prolonged period of time.  Both times, it resolved after she got up from the seated position and waited for 20 minutes or so.  She does not have any other numbness or paresthesias in her legs.  She has not noticed any pain radiating into her lower extremities.   Treatments tried: Naproxen, Tylenol, activity modification    Physical Exam:   General: no acute distress, appears stated age Neurologic: alert, answering questions appropriately, following commands Respiratory: unlabored breathing on room air, symmetric chest rise Psychiatric: appropriate affect, normal cadence to speech     MSK (spine):   -Strength exam                                                   Left                  Right EHL                              5/5                  5/5 TA                                  5/5                  5/5 GSC                             5/5                  5/5 Knee extension            5/5                  5/5 Hip flexion                    5/5                  5/5   -Sensory exam  Sensation intact to light touch in L3-S1 nerve distributions of bilateral lower extremities     Imaging: XR of the lumbar spine from 04/21/2022 was previously independently reviewed and interpreted, 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  Lucency around the L3/4 and L4/5 interbody device is seen best on the AP.  Thoracolumbar scoliosis with apex of the curvature at T12.  There is a rod above the lumbar fusion construct at ends around T11 on the right side.  PI of 64, LL of 14.   XR of the thoracic spine from 04/21/2022 was previously independently reviewed and interpreted, showing a single hook part of around in the thoracic spine.  Thoracolumbar curvature with apex at T12.  No fracture seen but difficult to appreciate the lower thoracic spine as there are overlapping ribs and the lungs.  No dislocation seen.     Patient name: Kristen [name] Patient MRN: [id number] Date of visit: 10/27/22

## 2022-10-30 NOTE — Progress Notes (Deleted)
CLINIC:  Survivorship  REASON FOR VISIT:  Routine follow-up for history of head & neck cancer.  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 Hezzie Bump performed left hemiglossectomy, resection of base of tongue cancer, pharyngoplasty, left selective neck dissection, levels I and II, tracheotomy, and G tube placement. She has questionable margins that are technically negative.   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 14 lymph nodes from the left level II neck, 2 nodes from the left submandibular region, and 4 nodes from the submental region were negative for malignancy. Carcinoma was present focally at the lateral edge of the left posterior tongue, 4.6 cm in greatest extent. The lateral margin was sent separately as specimen B, which was negative for carcinoma. Perineural invasion was appreciated but lymphovascular invasion was not identified.   10/29/2016 Initial Diagnosis   Carcinoma of contiguous sites of mouth [facility])   11/16/2016 - 01/01/2017 Radiation Therapy   Site/dose:   Base of tongue tumor bed/ 66 Gy in 33 fractions       INTERVAL HISTORY:  ***  -Pain:  -Nutrition/Diet:  -Dysphagia?:  -Dental issues?: using fluoride trays?  -Last TSH:  -Weight: (LOSS/GAIN) since (last visit)   -Last ENT visit:   -Last Rad Onc visit:  -Last Dentist visit:     ADDITIONAL REVIEW OF SYSTEMS:  ROS    CURRENT MEDICATIONS:  Current Outpatient Medications on File Prior to Visit  Medication Sig Dispense Refill   acetaminophen (TYLENOL) 500 MG tablet Take 2 tablets (1,000 mg total) by mouth every 8 (eight) hours as needed for moderate pain or mild pain. 80 tablet 3   AMBULATORY NON FORMULARY MEDICATION Jevity 1.5 Sig: 6 cans per day, Bolus feeding  60ml syringes #30  Tape and gauze 180 Can 11    amLODipine (NORVASC) 10 MG tablet TAKE 1 TABLET BY MOUTH EVERY DAY 90 tablet 1   aspirin 81 MG chewable tablet Place 81 mg into feeding tube daily.      baclofen (LIORESAL) 10 MG tablet PLACE 1 TABLET (10 MG TOTAL) INTO FEEDING TUBE 3 (THREE) TIMES DAILY. 270 tablet 0   Calcium Citrate 200 MG TABS Place 200 mg into feeding tube daily at 12 noon.     cholecalciferol (VITAMIN D3) 10 MCG (400 UNIT) TABS tablet Take 1 tablet (400 Units total) by mouth daily. 30 tablet 11   clotrimazole (MYCELEX) 10 MG troche Take 1 tablet (10 mg total) by mouth 3 (three) times daily as needed (infection). (Patient taking differently: Take 10 mg by mouth See admin instructions. 10 mg Per tube two times a day) 90 Troche 2   dexlansoprazole (DEXILANT) 60 MG capsule ADMINISTER 1 CAPSULE BY MOUTH VIA FEEDING TUBE DAILY Strength: 60 mg 90 capsule 1   diphenoxylate-atropine (LOMOTIL) 2.5-0.025 MG tablet TAKE TWO TABLETS VIA TUBE TWICE DAILY 120 tablet 5   ferrous sulfate 220 (44 Fe) MG/5ML solution TAKE 6.8ML VIA FEEDING TUBE EVERY DAY Strength: 220 (44 Fe) MG/5ML 205 mL 1   folic acid (FOLVITE) 400 MCG tablet Place 400 mcg into feeding tube daily.     lidocaine (LMX) 4 % cream Apply 1 application  topically as needed (to affected sites- for pain).     naproxen (NAPROSYN) 125 MG/5ML suspension Place 10 mLs (250 mg total) into feeding tube 3 (three) times daily with meals. 900 mL 2  ondansetron (ZOFRAN-ODT) 4 MG disintegrating tablet Take 1 tablet (4 mg total) by mouth every 8 (eight) hours as needed for nausea or vomiting. (Patient taking differently: Take 4 mg by mouth See admin instructions. 4 mg PER TUBE every 8 hours as needed for nausea or vomiting) 10 tablet 0   potassium chloride SA (KLOR-CON) 20 MEQ tablet TAKE 1 TABLET BY MOUTH TWICE DAILY FOR THE NEXT 3 DAYS. OKAY TO CRUSH IF UNABLE TO SWALLOW. CAN ALSO TAKE PER TUBE IF NEEDED. (Patient taking differently: 20 mEq See admin instructions. 20 mEq Per tube once a day) 6  tablet 0   sertraline (ZOLOFT) 25 MG tablet TAKE 1 TABLET BY MOUTH EVERY DAY (Patient taking differently: Take 25 mg by mouth See admin instructions. TAKE 1 TABLET BY MOUTH EVERY DAY) 90 tablet 1   No current facility-administered medications on file prior to visit.    ALLERGIES:  Allergies  Allergen Reactions   Sulfa Antibiotics Hives   Tomato Hives and Swelling     PHYSICAL EXAM:  There were no vitals filed for this visit. There were no vitals filed for this visit.   General: Well-nourished, well-appearing female in no acute distress.  Accompanied/Unaccompanied today.  HEENT: Head is atraumatic and normocephalic.  Pupils equal and reactive to light. Conjunctivae clear without exudate.  Sclerae anicteric. Oral mucosa is pink and moist without lesions.  Tongue pink, moist, and midline. Oropharynx is pink and moist, without lesions. Lymph: No preauricular, postauricular, cervical, supraclavicular, or infraclavicular lymphadenopathy noted on palpation.   Neck: No palpable masses. Skin on neck is ***.  Cardiovascular: Normal rate and rhythm. Respiratory: Clear to auscultation bilaterally. Chest expansion symmetric without accessory muscle use; breathing non-labored.  GI: Abdomen soft and round. Non-tender, non-distended. Bowel sounds normoactive.  GU: Deferred.   Neuro: No focal deficits. Steady gait.   Psych: Normal mood and affect for situation. Extremities: No edema.  Skin: Warm and dry.    LABORATORY DATA None at this visit.***  DIAGNOSTIC IMAGING:  None at this visit.    ASSESSMENT & PLAN:  Ms. Achey is a pleasant 57 y.o. female with history of stage IVA oral cancer s/p surgery and completed adjuvant radiation in 2018.  Patient presents to survivorship clinic today for routine follow-up after finishing treatment.   1. Head and neck cancer:  Ms. Marchiano is clinically without evidence of disease or recurrence on physical exam today.     #. Nutritional status: Ms. Fargnoli  reports that she is currently able to consume adequate nutrition by mouth/via tube***.  Weight is stable at *** lbs today.  Encouraged to continue to consume adequate hydration and nutrition, as tolerated.    #. At risk for dysphagia: Given Ms. Meares's treatment for *** cancer, which included surgery and radiation therapy***, she is at risk for chronic dysphagia.  she reports having difficulty with breads and meats, but is able to consume soft foods and liquids without difficulty.  I encouraged her to continue to perform the swallowing exercises, as directed by Verdie Mosher, SLP.  If Ms. Orama requires further swallowing or speech therapy evaluation, I will be happy to place that referral, if needed.  Currently, the patient's reported swallowing concerns are to be expected and stable.    #.  At risk for neck lymphedema:  When patients with head & neck cancers are treated with surgery and/or radiation therapy, there is an associated increased risk of neck lymphedema.  Ms. Mabee reports that currently she is experiencing what would  be considered mild symptoms. she does/does not*** have a neck compression garment and reports wearing/not wearing*** it, as directed.  I encouraged Ms. Yett to continue to wear the compression garment and practice the massage techniques to reduce the presence of lymphedema.  If her symptoms worsen, I would happy to place a formal referral to physical therapy for further evaluation and treatment.     #.  At risk for hypothyroidism: The thyroid gland is often affected after treatment for head & neck cancer.  Ms. Demos most recent TSH was normal at *** on (date).  If appropriate, she will be prescribed thyroid supplement with Levothyroxine. We discussed that she will continue to have serial TSH monitoring for at least the next 5 years as part of her routine follow-up and post-cancer treatment care.   #. At risk for tooth decay/dental concerns: After treatment with radiation for  head & neck cancers, patients often experience xerostomia which increases their risk of dental caries. Ms. Grumbine was encouraged to see his dentist 3-4 times per year. The patient should also continue to use her fluoride trays daily, as directed by dentistry.  I encouraged her to reach out to either Dr. Kristin Bruins (oncology dentist) or her primary care dentist with additional questions or concerns.   #.  Lung cancer screening:  [place] now offers eligible patients lung cancer screening with a low-dose chest CT to aid in early detection, provide more effective treatment options, and ultimately improve survival benefits for patients diagnosed early.  Below is the selection criteria for screening:  Medicare patients: 55-77 years; privately insured patients 55-80 years. Active or former smokers who have quit within the last 15 years. 30+ pack-year history of smoking  Exclusion criteria - No signs/symptoms of lung cancer (i.e., no recent history of hemoptysis and no unexplained weight loss >15 pounds in the last 6 months). Willing and healthy enough to undergo biopsies/surgery if needed.  Ms. Eng currently does/does not*** meet criteria for lung cancer screening.  Therefore, I have/have not*** placed a referral for screening today.    #. Tobacco & alcohol use: Ms. Tejera reports that she quit smoking in *** and continues to abstain from all tobacco products.  I congratulated her continued efforts to remain tobacco free.  I also reinforced the importance of avoiding alcohol consumption as well.  Both tobacco and alcohol use in patients with head & neck cancer increases the risk of recurrence.  They also increase the risk of other cancers, as well.  Ms. Coval states that she voiced understanding of the importance of continuing to remain both tobacco and alcohol-free.    #. Health maintenance and wellness promotion: Cancer patients who consume a diet rich in fruits and vegetables have better overall  health and decreased risk of cancer recurrence. Ms. Mena was encouraged to consume 5-7 servings of fruits and vegetables per day, as tolerated. Ms. Ehresman was also encouraged to engage in moderate to vigorous exercise for 30 minutes per day most days of the week.   #. Support services/counseling: It is not uncommon for this period of the patient's cancer care trajectory to be one of many emotions and stressors.  We discussed an opportunity for her to participate in the next session of Head & Neck FYNN ("Finding Your New Normal") support group series, designed for patients after they have completed treatment.   Ms. Summa was encouraged to take advantage of our many other support services programs, support groups, and/or counseling in coping with her new  life as a cancer survivor after completing anti-cancer treatment. The patient was offered support today through active listening and expressive supportive counseling.     Dispo:  -See Dr. Marland Kitchen (ENT) in ***/2017 -Return to cancer center to see Dr. Basilio Cairo in ***/2017 -Return to cancer center to see Survivorship NP in ***.    Total encounter time:*** minutes*in face-to-face visit time, chart review, lab review, care coordination, order entry, and documentation of the encounter time.    Lillard Anes, NP 10/30/22 3:23 PM Medical Oncology and Hematology [facility] [address] [place], [place] [address] Tel. [phone]    Fax. [phone]  *Total Encounter Time as defined by the Centers for Medicare and Medicaid Services includes, in addition to the face-to-face time of a patient visit (documented in the note above) non-face-to-face time: obtaining and reviewing outside history, ordering and reviewing medications, tests or procedures, care coordination (communications with other health care professionals or caregivers) and documentation in the medical record.

## 2022-11-01 ENCOUNTER — Inpatient Hospital Stay: Payer: Medicare Other | Attending: Adult Health | Admitting: Adult Health

## 2022-11-15 ENCOUNTER — Telehealth: Payer: Self-pay | Admitting: Internal Medicine

## 2022-11-15 DIAGNOSIS — K219 Gastro-esophageal reflux disease without esophagitis: Secondary | ICD-10-CM

## 2022-11-15 NOTE — Telephone Encounter (Signed)
Please advise Sir, thanks. 

## 2022-11-15 NOTE — Telephone Encounter (Signed)
PT is calling to get a refill on Lomotil and dexlansoprazole sent to [facility]

## 2022-11-16 MED ORDER — DEXLANSOPRAZOLE 60 MG PO CPDR
DELAYED_RELEASE_CAPSULE | ORAL | 3 refills | Status: DC
Start: 2022-11-16 — End: 2022-12-10

## 2022-11-16 MED ORDER — DIPHENOXYLATE-ATROPINE 2.5-0.025 MG PO TABS: ORAL_TABLET | ORAL | 11 refills | Status: DC

## 2022-11-16 NOTE — Telephone Encounter (Signed)
Rxs sent

## 2022-11-17 NOTE — Telephone Encounter (Signed)
Inbound call from patient returning phone call. Requesting a call back. Please advise, thank you.  

## 2022-11-17 NOTE — Telephone Encounter (Signed)
Left her another voice mail to call me back.

## 2022-11-17 NOTE — Telephone Encounter (Signed)
I left [name] a voice mail message that the rx's have been sent in as requested.

## 2022-11-17 NOTE — Telephone Encounter (Signed)
Left another voice mail for her to call me if she needs me.

## 2022-11-26 MED ORDER — DIPHENOXYLATE-ATROPINE 2.5-0.025 MG PO TABS: ORAL_TABLET | ORAL | 5 refills | Status: DC

## 2022-11-26 NOTE — Telephone Encounter (Signed)
Pharmacy rep called to get a refill on a medication diphenoxylate-atropine.

## 2022-11-26 NOTE — Addendum Note (Signed)
Addended by: [place], Alyson Ki E on: 11/26/2022 12:48 PM   Modules accepted: Orders

## 2022-11-26 NOTE — Telephone Encounter (Signed)
I spoke with [facility] and gave them a verbal order for her generic Lomotil refill. The refill Dr Leone Payor tried to send thru on 11/16/2022 printed and didn't get to them.

## 2022-12-02 ENCOUNTER — Inpatient Hospital Stay: Payer: Medicare Other | Attending: Adult Health | Admitting: Adult Health

## 2022-12-02 VITALS — BP 122/82 | HR 82 | Temp 98.3°F | Resp 17 | Ht 67.0 in | Wt 170.4 lb

## 2022-12-02 DIAGNOSIS — Z8261 Family history of arthritis: Secondary | ICD-10-CM | POA: Diagnosis not present

## 2022-12-02 DIAGNOSIS — Z8249 Family history of ischemic heart disease and other diseases of the circulatory system: Secondary | ICD-10-CM | POA: Insufficient documentation

## 2022-12-02 DIAGNOSIS — K219 Gastro-esophageal reflux disease without esophagitis: Secondary | ICD-10-CM | POA: Insufficient documentation

## 2022-12-02 DIAGNOSIS — Z9089 Acquired absence of other organs: Secondary | ICD-10-CM | POA: Diagnosis not present

## 2022-12-02 DIAGNOSIS — I10 Essential (primary) hypertension: Secondary | ICD-10-CM | POA: Diagnosis not present

## 2022-12-02 DIAGNOSIS — Z87440 Personal history of urinary (tract) infections: Secondary | ICD-10-CM | POA: Insufficient documentation

## 2022-12-02 DIAGNOSIS — Z79899 Other long term (current) drug therapy: Secondary | ICD-10-CM | POA: Diagnosis not present

## 2022-12-02 DIAGNOSIS — Z9049 Acquired absence of other specified parts of digestive tract: Secondary | ICD-10-CM | POA: Insufficient documentation

## 2022-12-02 DIAGNOSIS — Z833 Family history of diabetes mellitus: Secondary | ICD-10-CM | POA: Insufficient documentation

## 2022-12-02 DIAGNOSIS — Z5986 Financial insecurity: Secondary | ICD-10-CM | POA: Diagnosis not present

## 2022-12-02 DIAGNOSIS — K30 Functional dyspepsia: Secondary | ICD-10-CM | POA: Diagnosis not present

## 2022-12-02 DIAGNOSIS — Z9851 Tubal ligation status: Secondary | ICD-10-CM | POA: Insufficient documentation

## 2022-12-02 DIAGNOSIS — C760 Malignant neoplasm of head, face and neck: Secondary | ICD-10-CM

## 2022-12-02 DIAGNOSIS — D638 Anemia in other chronic diseases classified elsewhere: Secondary | ICD-10-CM | POA: Diagnosis not present

## 2022-12-02 DIAGNOSIS — Z87442 Personal history of urinary calculi: Secondary | ICD-10-CM | POA: Diagnosis not present

## 2022-12-02 DIAGNOSIS — C01 Malignant neoplasm of base of tongue: Secondary | ICD-10-CM | POA: Insufficient documentation

## 2022-12-02 NOTE — Progress Notes (Signed)
[facility] Cancer Follow up:    Kristen Blacker, MD [address] 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 Hezzie Bump performed left hemiglossectomy, resection of base of tongue cancer, pharyngoplasty, left selective neck dissection, levels I and II, tracheotomy, and G tube placement. She has questionable margins that are technically negative.   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 14 lymph nodes from the left level II neck, 2 nodes from the left submandibular region, and 4 nodes from the submental region were negative for malignancy. Carcinoma was present focally at the lateral edge of the left posterior tongue, 4.6 cm in greatest extent. The lateral margin was sent separately as specimen B, which was negative for carcinoma. Perineural invasion was appreciated but lymphovascular invasion was not identified.   11/16/2016 - 01/01/2017 Radiation Therapy   Site/dose:   Base of tongue tumor bed/ 66 Gy in 33 fractions      CURRENT THERAPY: observation  INTERVAL  HISTORY: Kristen [name] 57 y.o. female returns for f/u of her history of head and neck cancer.  She reports a recent increase in weight from 150 to 170 pounds, which she attributes to a lack of hunger cues leading to rushed eating habits. She attempts to maintain a regular eating schedule with meals in the morning and at night.  The patient denies any new lymph node swelling or pain. She has been compliant with her thyroid checks and is up-to-date with her mammograms. She reports an inverted left breast nipple, a long-standing condition, and is aware to report any changes.  Recently, she experienced an episode of severe indigestion, despite daily Dexilant use. She also reports occasional stomach discomfort, but this is not a regular issue. She continues to use a gastric tube for medication administration due to the smell of the medicine causing stomach discomfort.  The patient's menstrual cycle has been irregular, with a missed period last month. She is scheduled for a mammogram this year and is up to date with her cancer screenings.   Patient Active Problem List   Diagnosis Date Noted   Generalized weakness 07/18/2022   Dehydration 07/18/2022   Tension headache 07/18/2022   Depression 07/18/2022   History of tongue cancer 07/18/2022   PEG tube malfunction (HCC) 02/28/2022   Protein-calorie malnutrition, severe 04/29/2021   Pseudoarthrosis of lumbar spine 04/28/2021   Fusion of spine of lumbar region 04/28/2021   Pseudarthrosis after fusion or arthrodesis 04/20/2021    Class: Chronic   Tenosynovitis, de Quervain 12/14/2020   Arthritis of carpometacarpal [facility]) joint of left thumb 12/14/2020   Aortic atherosclerosis (HCC) 08/20/2020   Hyperkalemia 07/24/2020   Alcohol  use 08/05/2019   Bilateral lower extremity edema 08/05/2019   Peripheral edema 03/12/2019   Iron deficiency anemia 12/11/2018   Loosening of hardware in spine [facility])    Other spondylosis with radiculopathy, lumbar region     History of lumbar spinal fusion 11/06/2018   UTI (urinary tract infection) 05/15/2017   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   Malignant neoplasm of base of tongue (HCC) 10/29/2016   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   Headache associated with sexual activity 05/14/2016   Tongue lesion 05/14/2016   Thrombocytopenia (HCC) 01/22/2016   Chronic diarrhea 01/22/2016   Chest pain    Essential hypertension    Spinal stenosis of lumbar region 01/20/2016    Class: Chronic   DDD (degenerative disc disease), lumbar 01/20/2016    Class: Chronic   Spinal stenosis of lumbar region with neurogenic claudication 01/20/2016   Low back pain 12/22/2015   Hypersomnolence 10/13/2015   Muscle cramp 08/01/2015   GERD (gastroesophageal reflux disease) 07/09/2015   Anemia of chronic disease 01/08/2013   Morbid obesity with BMI of 40.0-44.9, adult (HCC) 01/08/2013    is allergic to sulfa antibiotics and tomato.  MEDICAL HISTORY: Past Medical History:  Diagnosis Date   Allergy    Anemia    Iron deficiency   Anxiety    Arthritis    back, left shoulder   Blood transfusion without reported diagnosis    Chicken pox    Depression    Diarrhea    takes Imodium daily   GERD (gastroesophageal reflux disease)    H/O hiatal hernia    Headache(784.0)    History of kidney stones    1996ish   History of radiation therapy 11/16/16- 01/01/17   Base of Tongue/ 66 gy in 33 fractions/ Dose: 2 Gy   Hypertension    Iron deficiency anemia 12/11/2018   Migraine    none for 5 years (as of 01/13/16)   OSA (obstructive sleep apnea) 10/13/2015   unable to get cpap, plans to get one in 2018   Pneumonia    Restless legs    Scoliosis    Shingles 08/28/2013   Shortness of breath    with exertion   Sleep apnea    Tongue cancer (HCC)    tongue cancer    SURGICAL HISTORY: Past Surgical History:  Procedure Laterality Date   BACK SURGERY  07/21/1978   CARPOMETACARPEL  SUSPENSION PLASTY Left 01/21/2021   Procedure: Left Thumb Ligament Carpometacarpal Arthroplasty;  Surgeon: Marlyne Beards, MD;  Location: [facility] OR;  Service: Orthopedics;  Laterality: Left;   CHOLECYSTECTOMY N/A 08/15/2013   Procedure: LAPAROSCOPIC CHOLECYSTECTOMY WITH INTRAOPERATIVE CHOLANGIOGRAM;  Surgeon: Cherylynn Ridges, MD;  Location: [facility] OR;  Service: General;  Laterality: N/A;   COLONOSCOPY N/A 01/09/2013   Procedure: COLONOSCOPY;  Surgeon: Theda Belfast, MD;  Location: [facility] ENDOSCOPY;  Service: Endoscopy;  Laterality: N/A;   COLONOSCOPY  03/2018   DIRECT LARYNGOSCOPY  07/2016   Dr. Hezzie Bump [facility]   ESOPHAGOGASTRODUODENOSCOPY  03/2018   ESOPHAGOGASTRODUODENOSCOPY (EGD) WITH PROPOFOL N/A 04/24/2019   Procedure: ESOPHAGOGASTRODUODENOSCOPY (EGD) WITH PROPOFOL;  Surgeon: Iva Boop, MD;  Location: [facility] ENDOSCOPY;  Service: Endoscopy;  Laterality: N/A;   GASTROSTOMY TUBE PLACEMENT  09/27/2016   GIVENS CAPSULE STUDY N/A 04/24/2019   Procedure: GIVENS CAPSULE STUDY;  Surgeon: Iva Boop, MD;  Location: [facility] ENDOSCOPY;  Service: Endoscopy;  Laterality: N/A;   HERNIA REPAIR Left 1981   IR PATIENT EVAL TECH  0-60 MINS  12/13/2016   IR PATIENT EVAL TECH 0-60 MINS  04/11/2017   IR PATIENT EVAL TECH 0-60 MINS  03/24/2018   IR PATIENT EVAL TECH 0-60 MINS  11/23/2018   IR PATIENT EVAL TECH 0-60 MINS  05/28/2019   IR REPLACE G-TUBE SIMPLE WO FLUORO  12/12/2017   IR REPLACE G-TUBE SIMPLE WO FLUORO  03/29/2018   IR REPLACE G-TUBE SIMPLE WO FLUORO  06/29/2018   IR REPLACE G-TUBE SIMPLE WO FLUORO  11/02/2018   IR REPLACE G-TUBE SIMPLE WO FLUORO  03/08/2019   IR REPLACE G-TUBE SIMPLE WO FLUORO  07/02/2019   IR REPLACE G-TUBE SIMPLE WO FLUORO  11/30/2019   IR REPLACE G-TUBE SIMPLE WO FLUORO  12/27/2019   IR REPLACE G-TUBE SIMPLE WO FLUORO  05/27/2020   IR REPLACE G-TUBE SIMPLE WO FLUORO  07/09/2020   IR REPLACE G-TUBE SIMPLE WO FLUORO  10/23/2021   IR REPLACE G-TUBE SIMPLE WO FLUORO  08/19/2022   IR REPLC GASTRO/COLONIC  TUBE PERCUT W/FLUORO  03/01/2022   MODIFIED RADICAL NECK DISSECTION Left 09/27/2016   Levels 1 & 2   PARTIAL GLOSSECTOMY Left 09/27/2016   Left hemi partial glossectomy   SPINE SURGERY  01/20/2016   fusion   TONSILLECTOMY     tracheotomy  09/27/2016   TUBAL LIGATION  06/1988    SOCIAL HISTORY: Social History   Socioeconomic History   Marital status: Widowed    Spouse name: Not on file   Number of children: 3   Years of education: 36   Highest education level: 12th grade  Occupational History   Occupation: Administrator, arts  Tobacco Use   Smoking status: Never   Smokeless tobacco: Never  Vaping Use   Vaping status: Never Used  Substance and Sexual Activity   Alcohol use: Yes    Alcohol/week: 7.0 standard drinks of alcohol    Types: 7 Cans of beer per week    Comment: occasionally   Drug use: No   Sexual activity: Yes    Birth control/protection: Surgical  Other Topics Concern   Not on file  Social History Narrative   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[employer], 3 children   Never smoker no drug use occasional alcohol averaging about 7 drinks a week   fun: Play with her grandkids, read   Social Determinants of Health   Financial Resource Strain: Medium Risk (06/04/2021)   Overall Financial Resource Strain (CARDIA)    Difficulty of Paying Living Expenses: Somewhat hard  Food Insecurity: Food Insecurity Present (07/18/2022)   Hunger Vital Sign    Worried About Running Out of Food in the Last Year: Often true    Ran Out of Food in the Last Year: Often true  Transportation Needs: No Transportation Needs (07/18/2022)   PRAPARE - Administrator, Civil Service (Medical): No    Lack of Transportation (Non-Medical): No  Physical Activity: Insufficiently Active (06/04/2021)   Exercise Vital Sign    Days of Exercise per Week: 3 days    Minutes of Exercise per Session: 30 min  Stress: No Stress Concern Present (06/04/2021)   [employer] of  Occupational Health - Occupational Stress Questionnaire    Feeling of Stress : Only a little  Recent Concern: Stress - Stress Concern Present (03/31/2021)   [employer] of Occupational Health - Occupational Stress Questionnaire    Feeling of Stress : To some extent  Social Connections: Moderately Integrated (06/04/2021)   Social  Connection and Isolation Panel [NHANES]    Frequency of Communication with Friends and Family: More than three times a week    Frequency of Social Gatherings with Friends and Family: More than three times a week    Attends Religious Services: More than 4 times per year    Active Member of [employer] or Organizations: Yes    Attends Banker Meetings: More than 4 times per year    Marital Status: Widowed  Recent Concern: Social Connections - Moderately Isolated (03/31/2021)   Social Connection and Isolation Panel [NHANES]    Frequency of Communication with Friends and Family: More than three times a week    Frequency of Social Gatherings with Friends and Family: More than three times a week    Attends Religious Services: More than 4 times per year    Active Member of [employer] or Organizations: No    Attends Banker Meetings: Not on file    Marital Status: Widowed  Intimate Partner Violence: Not At Risk (07/18/2022)   Humiliation, Afraid, Rape, and Kick questionnaire    Fear of Current or Ex-Partner: No    Emotionally Abused: No    Physically Abused: No    Sexually Abused: No    FAMILY HISTORY: Family History  Problem Relation Age of Onset   Heart disease Father    Heart attack Father    Hypertension Mother    Arthritis Mother    Diabetes Maternal Grandmother    Diabetes Paternal Grandmother    Diabetes Maternal Uncle    Colon polyps Neg Hx    Colon cancer Neg Hx    Esophageal cancer Neg Hx    Stomach cancer Neg Hx    Rectal cancer Neg Hx     Review of Systems  Constitutional:  Negative for appetite change, chills, fatigue,  fever and unexpected weight change.  HENT:   Negative for hearing loss, lump/mass and trouble swallowing.   Eyes:  Negative for eye problems and icterus.  Respiratory:  Negative for chest tightness, cough and shortness of breath.   Cardiovascular:  Negative for chest pain, leg swelling and palpitations.  Gastrointestinal:  Negative for abdominal distention, abdominal pain, constipation, diarrhea, nausea and vomiting.  Endocrine: Negative for hot flashes.  Genitourinary:  Negative for difficulty urinating.   Musculoskeletal:  Negative for arthralgias.  Skin:  Negative for itching and rash.  Neurological:  Negative for dizziness, extremity weakness, headaches and numbness.  Hematological:  Negative for adenopathy. Does not bruise/bleed easily.  Psychiatric/Behavioral:  Negative for depression. The patient is not nervous/anxious.     PHYSICAL EXAMINATION     Vitals:   12/02/22 1158  BP: 122/82  Pulse: 82  Resp: 17  Temp: 98.3 F (36.8 C)  SpO2: 98%   Physical Exam Constitutional:      General: She is not in acute distress.    Appearance: Normal appearance. She is not toxic-appearing.  HENT:     Head: Normocephalic and atraumatic.     Mouth/Throat:     Mouth: Mucous membranes are moist.     Pharynx: Oropharynx is clear. No oropharyngeal exudate or posterior oropharyngeal erythema.  Eyes:     General: No scleral icterus. Cardiovascular:     Rate and Rhythm: Normal rate and regular rhythm.     Pulses: Normal pulses.     Heart sounds: Normal heart sounds.  Pulmonary:     Effort: Pulmonary effort is normal.     Breath sounds: Normal breath sounds.  Abdominal:     General: Abdomen is flat. Bowel sounds are normal. There is no distension.     Palpations: Abdomen is soft.     Tenderness: There is no abdominal tenderness.  Musculoskeletal:        General: No swelling.     Cervical back: Neck supple.  Lymphadenopathy:     Cervical: No cervical adenopathy.  Skin:    General:  Skin is warm and dry.     Findings: No rash.  Neurological:     General: No focal deficit present.     Mental Status: She is alert.  Psychiatric:        Mood and Affect: Mood normal.        Behavior: Behavior normal.     LABORATORY DATA:  CBC    Component Value Date/Time   WBC 3.5 (L) 07/19/2022 0452   RBC 3.91 07/19/2022 0452   HGB 11.0 (L) 07/19/2022 0452   HGB 12.1 12/23/2016 1303   HCT 34.8 (L) 07/19/2022 0452   HCT 38.6 12/23/2016 1303   PLT 178 07/19/2022 0452   PLT 208 12/23/2016 1303   MCV 89.0 07/19/2022 0452   MCV 86.2 12/23/2016 1303   MCH 28.1 07/19/2022 0452   MCHC 31.6 07/19/2022 0452   RDW 14.6 07/19/2022 0452   RDW 15.4 (H) 12/23/2016 1303   LYMPHSABS 1.1 07/19/2022 0452   LYMPHSABS 0.5 (L) 12/23/2016 1303   MONOABS 0.3 07/19/2022 0452   MONOABS 0.5 12/23/2016 1303   EOSABS 0.1 07/19/2022 0452   EOSABS 0.2 12/23/2016 1303   BASOSABS 0.0 07/19/2022 0452   BASOSABS 0.0 12/23/2016 1303    CMP     Component Value Date/Time   NA 141 07/19/2022 0452   NA 144 01/14/2017 1035   K 3.4 (L) 07/19/2022 0452   K 4.2 01/14/2017 1035   CL 107 07/19/2022 0452   CO2 25 07/19/2022 0452   CO2 28 01/14/2017 1035   GLUCOSE 87 07/19/2022 0452   GLUCOSE 101 01/14/2017 1035   BUN 12 07/19/2022 0452   BUN 9.4 01/14/2017 1035   CREATININE 0.63 07/19/2022 0452   CREATININE 0.70 07/30/2019 1406   CREATININE 0.8 01/14/2017 1035   CALCIUM 8.9 07/19/2022 0452   CALCIUM 9.2 01/14/2017 1035   PROT 6.5 07/19/2022 0452   ALBUMIN 3.7 07/19/2022 0452   AST 17 07/19/2022 0452   ALT 14 07/19/2022 0452   ALKPHOS 90 07/19/2022 0452   BILITOT 0.5 07/19/2022 0452   GFRNONAA >60 07/19/2022 0452   GFRNONAA >60 05/13/2017 1223   GFRAA >60 11/09/2018 0434   GFRAA >60 05/13/2017 1223    ASSESSMENT and THERAPY PLAN:   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  She will continue to see ENT.   RTC in 1 year  for continued follow up.  No signs of dysphagia, pain, lymphedema, or other long term survivorship concerns.   Gastroesophageal Reflux Disease (GERD) Reports of indigestion despite daily Dexilant use. No other concerning symptoms. -Continue Dexilant as prescribed. -Report if symptoms persist or worsen.  Weight Management Weight has increased from 150 to 170 pounds, but no concerns expressed. -Continue current diet and exercise regimen. -Continue monitoring weight.  Follow-up No new or concerning symptoms. -Return for annual check-up or sooner if any new symptoms arise.   All questions were answered. The patient knows to call the clinic with any problems, questions or concerns. We can certainly see the patient  much sooner if necessary.  Total encounter time:20 minutes*in face-to-face visit time, chart review, lab review, care coordination, order entry, and documentation of the encounter time.  Lillard Anes, NP 12/05/22 10:12 PM Medical Oncology and Hematology [facility] [address] [place], [place] [address] Tel. [phone]    Fax. [phone]  *Total Encounter Time as defined by the Centers for Medicare and Medicaid Services includes, in addition to the face-to-face time of a patient visit (documented in the note above) non-face-to-face time: obtaining and reviewing outside history, ordering and reviewing medications, tests or procedures, care coordination (communications with other health care professionals or caregivers) and documentation in the medical record.

## 2022-12-05 NOTE — Assessment & Plan Note (Signed)
Head and Neck cancer: She has no clinical signs of recurrence.  She will continue to see ENT.   RTC in 1 year for continued follow up.  No signs of dysphagia, pain, lymphedema, or other long term survivorship concerns.   Gastroesophageal Reflux Disease (GERD) Reports of indigestion despite daily Dexilant use. No other concerning symptoms. -Continue Dexilant as prescribed. -Report if symptoms persist or worsen.  Weight Management Weight has increased from 150 to 170 pounds, but no concerns expressed. -Continue current diet and exercise regimen. -Continue monitoring weight.  Follow-up No new or concerning symptoms. -Return for annual check-up or sooner if any new symptoms arise.

## 2022-12-10 ENCOUNTER — Telehealth: Payer: Self-pay | Admitting: Internal Medicine

## 2022-12-10 DIAGNOSIS — K219 Gastro-esophageal reflux disease without esophagitis: Secondary | ICD-10-CM

## 2022-12-10 MED ORDER — DEXLANSOPRAZOLE 60 MG PO CPDR
DELAYED_RELEASE_CAPSULE | ORAL | 3 refills | Status: DC
Start: 2022-12-10 — End: 2023-01-27

## 2022-12-10 NOTE — Telephone Encounter (Signed)
Dexlansoprazole rx faxed to [facility] as requested. Fax # [phone].

## 2022-12-10 NOTE — Telephone Encounter (Signed)
Inbound call from [facility] requesting a refill for Dexilant medication for patient. Please advise, thank you.

## 2022-12-13 ENCOUNTER — Other Ambulatory Visit: Payer: Self-pay | Admitting: Family Medicine

## 2022-12-13 ENCOUNTER — Other Ambulatory Visit: Payer: Self-pay | Admitting: Internal Medicine

## 2022-12-13 DIAGNOSIS — K219 Gastro-esophageal reflux disease without esophagitis: Secondary | ICD-10-CM

## 2022-12-13 DIAGNOSIS — I10 Essential (primary) hypertension: Secondary | ICD-10-CM

## 2022-12-17 ENCOUNTER — Other Ambulatory Visit (HOSPITAL_COMMUNITY): Payer: Self-pay | Admitting: Diagnostic Radiology

## 2022-12-17 ENCOUNTER — Encounter (HOSPITAL_COMMUNITY): Payer: Self-pay

## 2022-12-17 ENCOUNTER — Ambulatory Visit (HOSPITAL_COMMUNITY)
Admission: RE | Admit: 2022-12-17 | Discharge: 2022-12-17 | Disposition: A | Discharge status: Home / Self Care | Payer: Medicare Other | Source: Ambulatory Visit | Attending: Diagnostic Radiology | Admitting: Diagnostic Radiology

## 2022-12-17 DIAGNOSIS — E222 Syndrome of inappropriate secretion of antidiuretic hormone: Secondary | ICD-10-CM | POA: Diagnosis not present

## 2022-12-17 DIAGNOSIS — K9423 Gastrostomy malfunction: Secondary | ICD-10-CM | POA: Diagnosis present

## 2022-12-17 HISTORY — PX: IR PATIENT EVAL TECH 0-60 MINS: IMG5564

## 2022-12-17 NOTE — Procedures (Signed)
Patient was seen in IR for G-tube malfunction. Tube was broken and replaced with another 18 Fr MIC G-tube. Balloon as inflated with 9 mL of sterile water, tube was flushed with heparinized saline. Split gauze dressing was placed and patient was given all necessary supplies for feeding.

## 2023-01-04 ENCOUNTER — Other Ambulatory Visit: Payer: Self-pay | Admitting: Orthopedic Surgery

## 2023-01-12 ENCOUNTER — Other Ambulatory Visit: Payer: Self-pay

## 2023-01-12 ENCOUNTER — Emergency Department (HOSPITAL_COMMUNITY): Payer: Medicare Other

## 2023-01-12 ENCOUNTER — Observation Stay (HOSPITAL_COMMUNITY)
Admission: EM | Admit: 2023-01-12 | Discharge: 2023-01-14 | Disposition: A | Discharge status: Home / Self Care | Payer: Medicare Other | Attending: Internal Medicine | Admitting: Internal Medicine

## 2023-01-12 ENCOUNTER — Encounter (HOSPITAL_COMMUNITY): Payer: Self-pay

## 2023-01-12 DIAGNOSIS — N390 Urinary tract infection, site not specified: Secondary | ICD-10-CM | POA: Diagnosis present

## 2023-01-12 DIAGNOSIS — R109 Unspecified abdominal pain: Secondary | ICD-10-CM | POA: Diagnosis present

## 2023-01-12 DIAGNOSIS — K567 Ileus, unspecified: Principal | ICD-10-CM

## 2023-01-12 DIAGNOSIS — Z7982 Long term (current) use of aspirin: Secondary | ICD-10-CM | POA: Diagnosis not present

## 2023-01-12 DIAGNOSIS — Z79899 Other long term (current) drug therapy: Secondary | ICD-10-CM | POA: Diagnosis not present

## 2023-01-12 DIAGNOSIS — K529 Noninfective gastroenteritis and colitis, unspecified: Principal | ICD-10-CM

## 2023-01-12 DIAGNOSIS — D509 Iron deficiency anemia, unspecified: Secondary | ICD-10-CM | POA: Diagnosis not present

## 2023-01-12 DIAGNOSIS — C01 Malignant neoplasm of base of tongue: Secondary | ICD-10-CM | POA: Diagnosis present

## 2023-01-12 DIAGNOSIS — Z85810 Personal history of malignant neoplasm of tongue: Secondary | ICD-10-CM | POA: Diagnosis not present

## 2023-01-12 DIAGNOSIS — I10 Essential (primary) hypertension: Secondary | ICD-10-CM | POA: Diagnosis not present

## 2023-01-12 LAB — CBC
HCT: 39 % (ref 36.0–46.0)
HCT: 38.6 % (ref 36.0–46.0)
Hemoglobin: 12.8 g/dL (ref 12.0–15.0)
Hemoglobin: 12.2 g/dL (ref 12.0–15.0)
MCH: 29.2 pg (ref 26.0–34.0)
MCH: 28.6 pg (ref 26.0–34.0)
MCHC: 32.8 g/dL (ref 30.0–36.0)
MCHC: 31.6 g/dL (ref 30.0–36.0)
MCV: 88.8 fL (ref 80.0–100.0)
MCV: 90.4 fL (ref 80.0–100.0)
Platelets: 150 10*3/uL (ref 150–400)
Platelets: 233 10*3/uL (ref 150–400)
RBC: 4.39 MIL/uL (ref 3.87–5.11)
RBC: 4.27 MIL/uL (ref 3.87–5.11)
RDW: 15 % (ref 11.5–15.5)
RDW: 15.1 % (ref 11.5–15.5)
WBC: 6 10*3/uL (ref 4.0–10.5)
WBC: 5.1 10*3/uL (ref 4.0–10.5)
nRBC: 0 % (ref 0.0–0.2)
nRBC: 0 % (ref 0.0–0.2)

## 2023-01-12 LAB — URINALYSIS, ROUTINE W REFLEX MICROSCOPIC
Bilirubin Urine: NEGATIVE
Glucose, UA: NEGATIVE mg/dL
Hgb urine dipstick: NEGATIVE
Ketones, ur: NEGATIVE mg/dL
Nitrite: POSITIVE — AB
Protein, ur: NEGATIVE mg/dL
Specific Gravity, Urine: 1.023 (ref 1.005–1.030)
WBC, UA: >50 WBC/hpf (ref 0–5)
pH: 5 (ref 5.0–8.0)

## 2023-01-12 LAB — CREATININE, SERUM
Creatinine, Ser: 0.55 mg/dL (ref 0.44–1.00)
GFR, Estimated: >60 mL/min (ref >60)

## 2023-01-12 LAB — COMPREHENSIVE METABOLIC PANEL
ALT: 13 U/L (ref 0–44)
AST: 30 U/L (ref 15–41)
Albumin: 4 g/dL (ref 3.5–5.0)
Alkaline Phosphatase: 122 U/L (ref 38–126)
Anion gap: 10 (ref 5–15)
BUN: 11 mg/dL (ref 6–20)
CO2: 22 mmol/L (ref 22–32)
Calcium: 8.9 mg/dL (ref 8.9–10.3)
Chloride: 108 mmol/L (ref 98–111)
Creatinine, Ser: 0.82 mg/dL (ref 0.44–1.00)
GFR, Estimated: >60 mL/min (ref >60)
Glucose, Bld: 132 mg/dL — ABNORMAL HIGH (ref 70–99)
Potassium: 4 mmol/L (ref 3.5–5.1)
Sodium: 140 mmol/L (ref 135–145)
Total Bilirubin: 0.7 mg/dL (ref <1.2)
Total Protein: 7.5 g/dL (ref 6.5–8.1)

## 2023-01-12 LAB — GLUCOSE, CAPILLARY: Glucose-Capillary: 107 mg/dL — ABNORMAL HIGH (ref 70–99)

## 2023-01-12 LAB — LIPASE, BLOOD: Lipase: 23 U/L (ref 11–51)

## 2023-01-12 MED ORDER — IOHEXOL 300 MG/ML  SOLN
100.0000 mL | Freq: Once | INTRAMUSCULAR | Status: AC | PRN
  Administered 2023-01-12: 100 mL via INTRAVENOUS

## 2023-01-12 MED ORDER — SODIUM CHLORIDE 0.9 % IV SOLN
2.0000 g | INTRAVENOUS | Status: DC
  Administered 2023-01-13: 2 g via INTRAVENOUS
  Filled 2023-01-12: qty 20

## 2023-01-12 MED ORDER — HYDROCODONE-ACETAMINOPHEN 5-325 MG PO TABS
1.0000 | ORAL_TABLET | ORAL | Status: DC | PRN
  Administered 2023-01-13: 1 via ORAL
  Filled 2023-01-12: qty 1

## 2023-01-12 MED ORDER — PANTOPRAZOLE SODIUM 40 MG IV SOLR
40.0000 mg | Freq: Every day | INTRAVENOUS | Status: DC
  Administered 2023-01-12 – 2023-01-14 (×3): 40 mg via INTRAVENOUS
  Filled 2023-01-12 (×3): qty 10

## 2023-01-12 MED ORDER — BACLOFEN 10 MG PO TABS: 10.0000 mg | ORAL_TABLET | Freq: Every day | ORAL | Status: DC | PRN

## 2023-01-12 MED ORDER — HYDROMORPHONE HCL 1 MG/ML IJ SOLN
1.0000 mg | Freq: Once | INTRAMUSCULAR | Status: AC
  Administered 2023-01-12: 1 mg via INTRAVENOUS
  Filled 2023-01-12: qty 1

## 2023-01-12 MED ORDER — SODIUM CHLORIDE 0.9 % IV SOLN
1.0000 g | Freq: Once | INTRAVENOUS | Status: AC
  Administered 2023-01-12: 1 g via INTRAVENOUS
  Filled 2023-01-12: qty 10

## 2023-01-12 MED ORDER — ASPIRIN 81 MG PO CHEW
81.0000 mg | CHEWABLE_TABLET | Freq: Every day | ORAL | Status: DC
Start: 2023-01-12 — End: 2023-01-14
  Administered 2023-01-12 – 2023-01-14 (×3): 81 mg
  Filled 2023-01-12 (×3): qty 1

## 2023-01-12 MED ORDER — METRONIDAZOLE 500 MG/100ML IV SOLN
500.0000 mg | Freq: Two times a day (BID) | INTRAVENOUS | Status: DC
  Administered 2023-01-13 – 2023-01-14 (×3): 500 mg via INTRAVENOUS
  Filled 2023-01-12 (×3): qty 100

## 2023-01-12 MED ORDER — KCL-LACTATED RINGERS-D5W 20 MEQ/L IV SOLN
INTRAVENOUS | Status: DC
  Filled 2023-01-12: qty 1000

## 2023-01-12 MED ORDER — METRONIDAZOLE 500 MG/100ML IV SOLN
500.0000 mg | Freq: Once | INTRAVENOUS | Status: AC
  Administered 2023-01-12: 500 mg via INTRAVENOUS
  Filled 2023-01-12: qty 100

## 2023-01-12 MED ORDER — ACETAMINOPHEN 325 MG PO TABS
650.0000 mg | ORAL_TABLET | Freq: Four times a day (QID) | ORAL | Status: DC | PRN
  Administered 2023-01-14: 650 mg via ORAL
  Filled 2023-01-12: qty 2

## 2023-01-12 MED ORDER — ENOXAPARIN SODIUM 40 MG/0.4ML IJ SOSY
40.0000 mg | PREFILLED_SYRINGE | INTRAMUSCULAR | Status: DC
  Administered 2023-01-13 – 2023-01-14 (×2): 40 mg via SUBCUTANEOUS
  Filled 2023-01-12 (×2): qty 0.4

## 2023-01-12 MED ORDER — POLYETHYLENE GLYCOL 3350 17 G PO PACK: 17.0000 g | PACK | Freq: Every day | ORAL | Status: DC | PRN

## 2023-01-12 MED ORDER — SODIUM CHLORIDE 0.9% FLUSH
3.0000 mL | Freq: Two times a day (BID) | INTRAVENOUS | Status: DC
  Administered 2023-01-12 – 2023-01-13 (×2): 3 mL via INTRAVENOUS

## 2023-01-12 MED ORDER — CHOLECALCIFEROL 10 MCG (400 UNIT) PO TABS
400.0000 [IU] | ORAL_TABLET | Freq: Every day | ORAL | Status: DC
  Administered 2023-01-12 – 2023-01-14 (×3): 400 [IU]
  Filled 2023-01-12 (×3): qty 1

## 2023-01-12 MED ORDER — BACLOFEN 10 MG PO TABS: 10.0000 mg | ORAL_TABLET | ORAL | Status: DC

## 2023-01-12 MED ORDER — ALUM & MAG HYDROXIDE-SIMETH 200-200-20 MG/5ML PO SUSP
30.0000 mL | Freq: Four times a day (QID) | ORAL | Status: DC | PRN
  Administered 2023-01-12: 30 mL
  Filled 2023-01-12: qty 30

## 2023-01-12 MED ORDER — BACLOFEN 10 MG PO TABS
10.0000 mg | ORAL_TABLET | Freq: Two times a day (BID) | ORAL | Status: DC
  Administered 2023-01-12 – 2023-01-14 (×4): 10 mg
  Filled 2023-01-12 (×4): qty 1

## 2023-01-12 MED ORDER — HYDROCODONE-ACETAMINOPHEN 5-325 MG PO TABS
2.0000 | ORAL_TABLET | ORAL | Status: DC | PRN
  Administered 2023-01-13 (×2): 2 via ORAL
  Filled 2023-01-12 (×2): qty 2

## 2023-01-12 MED ORDER — MORPHINE SULFATE (PF) 4 MG/ML IV SOLN
4.0000 mg | Freq: Once | INTRAVENOUS | Status: AC
  Administered 2023-01-12: 4 mg via INTRAVENOUS
  Filled 2023-01-12: qty 1

## 2023-01-12 MED ORDER — SERTRALINE HCL 25 MG PO TABS
25.0000 mg | ORAL_TABLET | Freq: Every day | ORAL | Status: DC
  Administered 2023-01-12 – 2023-01-14 (×3): 25 mg
  Filled 2023-01-12 (×3): qty 1

## 2023-01-12 MED ORDER — ACETAMINOPHEN 650 MG RE SUPP: 650.0000 mg | Freq: Four times a day (QID) | RECTAL | Status: DC | PRN

## 2023-01-12 NOTE — ED Provider Notes (Signed)
[place]  EMERGENCY DEPARTMENT AT [facility] Provider Note   CSN: [id number] Arrival date & time: 01/12/23  1914     History  Chief Complaint  Patient presents with   Abdominal Pain    Kristen [name] is a 57 y.o. female.  HPI   57 year old female presents the emergency department with abdominal pain.  Patient states that this started yesterday morning, was worsened overnight and more pronounced this morning.  She has had a decreased appetite, feels like eating makes the pain worse.  She denies any nausea/vomiting/diarrhea.  Denies any genitourinary symptoms which she does have history of kidney stones.  The pain is bilateral in her lower abdomen wrapping around to her bilateral flanks.  No history of AAA.  Home Medications Prior to Admission medications   Medication Sig Start Date End Date Taking? Authorizing Provider  acetaminophen (TYLENOL) 500 MG tablet Take 2 tablets (1,000 mg total) by mouth every 8 (eight) hours as needed for moderate pain or mild pain. 04/21/22   London Sheer, MD  AMBULATORY NON FORMULARY MEDICATION Jevity 1.5 Sig: 6 cans per day, Bolus feeding  60ml syringes #30  Tape and gauze 03/12/22   Iva Boop, MD  amLODipine (NORVASC) 10 MG tablet TAKE 1 TABLET BY MOUTH EVERY DAY 12/14/22   Deeann Saint, MD  aspirin 81 MG chewable tablet Place 81 mg into feeding tube daily.  10/05/16   [provider]  baclofen (LIORESAL) 10 MG tablet PLACE 1 TABLET (10 MG TOTAL) INTO FEEDING TUBE 3 (THREE) TIMES DAILY. 01/04/23 04/04/23  London Sheer, MD  Calcium Citrate 200 MG TABS Place 200 mg into feeding tube daily at 12 noon.    [provider]  cholecalciferol (VITAMIN D3) 10 MCG (400 UNIT) TABS tablet Take 1 tablet (400 Units total) by mouth daily. 03/12/22   Iva Boop, MD  clotrimazole (MYCELEX) 10 MG troche Take 1 tablet (10 mg total) by mouth 3 (three) times daily as needed (infection). Patient taking differently: Take  10 mg by mouth See admin instructions. 10 mg Per tube two times a day 10/28/20   Loa Socks, NP  dexlansoprazole (DEXILANT) 60 MG capsule ADMINISTER 1 CAPSULE BY MOUTH VIA FEEDING TUBE DAILY Strength: 60 mg 12/10/22   Iva Boop, MD  diphenoxylate-atropine (LOMOTIL) 2.5-0.025 MG tablet 2 tablets via tube twice a day 11/26/22   Iva Boop, MD  ferrous sulfate 220 (44 Fe) MG/5ML solution TAKE 6.8ML VIA FEEDING TUBE EVERY DAY Strength: 220 (44 Fe) MG/5ML 09/06/22   Iva Boop, MD  folic acid (FOLVITE) 400 MCG tablet Place 400 mcg into feeding tube daily.    [provider]  lidocaine (LMX) 4 % cream Apply 1 application  topically as needed (to affected sites- for pain).    [provider]  ondansetron (ZOFRAN-ODT) 4 MG disintegrating tablet Take 1 tablet (4 mg total) by mouth every 8 (eight) hours as needed for nausea or vomiting. Patient taking differently: Take 4 mg by mouth See admin instructions. 4 mg PER TUBE every 8 hours as needed for nausea or vomiting 08/24/21   Renne Crigler, PA-C  potassium chloride SA (KLOR-CON) 20 MEQ tablet TAKE 1 TABLET BY MOUTH TWICE DAILY FOR THE NEXT 3 DAYS. OKAY TO CRUSH IF UNABLE TO SWALLOW. CAN ALSO TAKE PER TUBE IF NEEDED. Patient taking differently: 20 mEq See admin instructions. 20 mEq Per tube once a day 08/13/19   Deeann Saint, MD  sertraline (  ZOLOFT) 25 MG tablet TAKE 1 TABLET BY MOUTH EVERY DAY Patient taking differently: Take 25 mg by mouth See admin instructions. TAKE 1 TABLET BY MOUTH EVERY DAY 04/26/22   Deeann Saint, MD      Allergies    Sulfa antibiotics and Tomato    Review of Systems   Review of Systems  Constitutional:  Positive for appetite change and fatigue. Negative for chills and fever.  Respiratory:  Negative for shortness of breath.   Cardiovascular:  Negative for chest pain.  Gastrointestinal:  Positive for abdominal pain. Negative for blood in stool, diarrhea, nausea and vomiting.   Genitourinary:  Negative for dysuria and hematuria.  Musculoskeletal:  Positive for back pain.  Skin:  Negative for rash.  Neurological:  Negative for headaches.    Physical Exam Updated Vital Signs BP (!) 123/97 (BP Location: Left Arm)   Pulse 91   Temp 98.3 F (36.8 C) (Oral)   Resp (!) 22   Ht 5\' 7"  (1.702 m)   Wt 77.1 kg   LMP 11/15/2013   BMI 26.63 kg/m  Physical Exam Vitals and nursing note reviewed.  Constitutional:      General: She is not in acute distress.    Appearance: Normal appearance.  HENT:     Head: Normocephalic.     Mouth/Throat:     Mouth: Mucous membranes are moist.  Cardiovascular:     Rate and Rhythm: Normal rate.  Pulmonary:     Effort: Pulmonary effort is normal. No respiratory distress.  Abdominal:     General: Bowel sounds are normal. There is no distension.     Palpations: Abdomen is soft.     Tenderness: There is generalized abdominal tenderness. There is guarding. There is no rebound.  Skin:    General: Skin is warm.  Neurological:     Mental Status: She is alert and oriented to person, place, and time. Mental status is at baseline.  Psychiatric:        Mood and Affect: Mood normal.     ED Results / Procedures / Treatments   Labs (all labs ordered are listed, but only abnormal results are displayed) Labs Reviewed  COMPREHENSIVE METABOLIC PANEL - Abnormal; Notable for the following components:      Result Value   Glucose, Bld 132 (*)    All other components within normal limits  URINALYSIS, ROUTINE W REFLEX MICROSCOPIC - Abnormal; Notable for the following components:   Color, Urine AMBER (*)    APPearance HAZY (*)    Nitrite POSITIVE (*)    Leukocytes,Ua LARGE (*)    Bacteria, UA FEW (*)    All other components within normal limits  LIPASE, BLOOD  CBC    EKG None  Radiology No results found.  Procedures Procedures    Medications Ordered in ED Medications  morphine (PF) 4 MG/ML injection 4 mg (4 mg Intravenous  Given 01/12/23 1104)    ED Course/ Medical Decision Making/ A&P                                 Medical Decision Making Amount and/or Complexity of Data Reviewed Labs: ordered. Radiology: ordered.  Risk Prescription drug management. Decision regarding hospitalization.   57 year old female presents emergency department with abdominal pain, diarrhea.  Pain is worsening today.  She does have a G-tube in place for head and neck cancer history.  Vitals are stable on arrival.  She does have a mildly rigid but diffusely tender abdomen.  Blood work is reassuring, abdominal labs are normal, urinalysis is nitrate and leukocyte positive with few bacteria, urine culture sent.  CT of the abdomen of pelvis shows findings of enteritis with ileus, G-tube is in place.  Patient is requiring IV medicine for pain control.  Patients evaluation and results requires admission for further treatment and care.  Spoke with hospitalist, reviewed patient's ED course and they accept admission.  Patient agrees with admission plan, offers no new complaints and is stable/unchanged at time of admit.        Final Clinical Impression(s) / ED Diagnoses Final diagnoses:  None    Rx / DC Orders ED Discharge Orders     None         Rozelle Logan, DO 01/12/23 1626

## 2023-01-12 NOTE — H&P (Signed)
History and Physical    Patient: Kristen [name] XBJ:[id number] DOB: Mar 12, 1965 DOA: 01/12/2023 DOS: the patient was seen and examined on 01/12/2023 PCP: [name], MD  Patient coming from: Home  Chief Complaint:  Chief Complaint  Patient presents with   Abdominal Pain   HPI: Kristen [name] is a 57 y.o. female with medical history significant of remote ENT (tongue) ca s/p resection and PEG tube placement. Patient advises me that she is "cancer free".  Patient actually eats regular food orally.  Patient reports being in her usual state of health till the evening before last when she reports new onset of moderate to severe generalized crampy abdominal discomfort/pain with no aggravating relieving factor associated with loose bowel movements 2-3 a day.  There was no bleeding no fever no vomiting.  Patient reports that her last bowel movement was around noon time today.  And she has had only 1 bowel movement today.  Patient came to the ER due to her severe abdominal pain.  CT abdomen pelvis is showing enteritis.  Medical evaluation is sought. No dysuria Review of Systems: As mentioned in the history of present illness. All other systems reviewed and are negative. Past Medical History:  Diagnosis Date   Allergy    Anemia    Iron deficiency   Anxiety    Arthritis    back, left shoulder   Blood transfusion without reported diagnosis    Chicken pox    Depression    Diarrhea    takes Imodium daily   GERD (gastroesophageal reflux disease)    H/O hiatal hernia    Headache(784.0)    History of kidney stones    1996ish   History of radiation therapy 11/16/16- 01/01/17   Base of Tongue/ 66 gy in 33 fractions/ Dose: 2 Gy   Hypertension    Iron deficiency anemia 12/11/2018   Migraine    none for 5 years (as of 01/13/16)   OSA (obstructive sleep apnea) 10/13/2015   unable to get cpap, plans to get one in 2018   Pneumonia    Restless legs    Scoliosis    Shingles  08/28/2013   Shortness of breath    with exertion   Sleep apnea    Tongue cancer (HCC)    tongue cancer   Past Surgical History:  Procedure Laterality Date   BACK SURGERY  07/21/1978   CARPOMETACARPEL SUSPENSION PLASTY Left 01/21/2021   Procedure: Left Thumb Ligament Carpometacarpal Arthroplasty;  Surgeon: Marlyne Beards, MD;  Location: [facility] OR;  Service: Orthopedics;  Laterality: Left;   CHOLECYSTECTOMY N/A 08/15/2013   Procedure: LAPAROSCOPIC CHOLECYSTECTOMY WITH INTRAOPERATIVE CHOLANGIOGRAM;  Surgeon: Cherylynn Ridges, MD;  Location: [facility] OR;  Service: General;  Laterality: N/A;   COLONOSCOPY N/A 01/09/2013   Procedure: COLONOSCOPY;  Surgeon: Theda Belfast, MD;  Location: [facility] ENDOSCOPY;  Service: Endoscopy;  Laterality: N/A;   COLONOSCOPY  03/2018   DIRECT LARYNGOSCOPY  07/2016   Dr. Hezzie Bump [facility]   ESOPHAGOGASTRODUODENOSCOPY  03/2018   ESOPHAGOGASTRODUODENOSCOPY (EGD) WITH PROPOFOL N/A 04/24/2019   Procedure: ESOPHAGOGASTRODUODENOSCOPY (EGD) WITH PROPOFOL;  Surgeon: Iva Boop, MD;  Location: [facility] ENDOSCOPY;  Service: Endoscopy;  Laterality: N/A;   GASTROSTOMY TUBE PLACEMENT  09/27/2016   GIVENS CAPSULE STUDY N/A 04/24/2019   Procedure: GIVENS CAPSULE STUDY;  Surgeon: Iva Boop, MD;  Location: [facility] ENDOSCOPY;  Service: Endoscopy;  Laterality: N/A;   HERNIA REPAIR Left 1981   IR PATIENT EVAL TECH 0-60 MINS  12/13/2016  IR PATIENT EVAL TECH 0-60 MINS  04/11/2017   IR PATIENT EVAL TECH 0-60 MINS  03/24/2018   IR PATIENT EVAL TECH 0-60 MINS  11/23/2018   IR PATIENT EVAL TECH 0-60 MINS  05/28/2019   IR PATIENT EVAL TECH 0-60 MINS  12/17/2022   IR REPLACE G-TUBE SIMPLE WO FLUORO  12/12/2017   IR REPLACE G-TUBE SIMPLE WO FLUORO  03/29/2018   IR REPLACE G-TUBE SIMPLE WO FLUORO  06/29/2018   IR REPLACE G-TUBE SIMPLE WO FLUORO  11/02/2018   IR REPLACE G-TUBE SIMPLE WO FLUORO  03/08/2019   IR REPLACE G-TUBE SIMPLE WO FLUORO  07/02/2019   IR REPLACE G-TUBE SIMPLE WO FLUORO  11/30/2019   IR REPLACE  G-TUBE SIMPLE WO FLUORO  12/27/2019   IR REPLACE G-TUBE SIMPLE WO FLUORO  05/27/2020   IR REPLACE G-TUBE SIMPLE WO FLUORO  07/09/2020   IR REPLACE G-TUBE SIMPLE WO FLUORO  10/23/2021   IR REPLACE G-TUBE SIMPLE WO FLUORO  08/19/2022   IR REPLC GASTRO/COLONIC TUBE PERCUT W/FLUORO  03/01/2022   MODIFIED RADICAL NECK DISSECTION Left 09/27/2016   Levels 1 & 2   PARTIAL GLOSSECTOMY Left 09/27/2016   Left hemi partial glossectomy   SPINE SURGERY  01/20/2016   fusion   TONSILLECTOMY     tracheotomy  09/27/2016   TUBAL LIGATION  06/1988   Social History:  reports that she has never smoked. She has never used smokeless tobacco. She reports current alcohol use of about 7.0 standard drinks of alcohol per week. She reports that she does not use drugs.  Allergies  Allergen Reactions   Sulfa Antibiotics Hives   Tomato Hives and Swelling    Family History  Problem Relation Age of Onset   Heart disease Father    Heart attack Father    Hypertension Mother    Arthritis Mother    Diabetes Maternal Grandmother    Diabetes Paternal Grandmother    Diabetes Maternal Uncle    Colon polyps Neg Hx    Colon cancer Neg Hx    Esophageal cancer Neg Hx    Stomach cancer Neg Hx    Rectal cancer Neg Hx     Prior to Admission medications   Medication Sig Start Date End Date Taking? Authorizing Provider  acetaminophen (TYLENOL) 500 MG tablet Take 2 tablets (1,000 mg total) by mouth every 8 (eight) hours as needed for moderate pain or mild pain. Patient taking differently: Place 1,000 mg into feeding tube every 8 (eight) hours as needed for moderate pain (pain score 4-6) or mild pain (pain score 1-3). 04/21/22  Yes London Sheer, MD  AMBULATORY NON FORMULARY MEDICATION Jevity 1.5 Sig: 6 cans per day, Bolus feeding  60ml syringes #30  Tape and gauze 03/12/22  Yes Iva Boop, MD  amLODipine (NORVASC) 10 MG tablet TAKE 1 TABLET BY MOUTH EVERY DAY Patient taking differently: Place 10 mg into feeding tube  daily. 12/14/22  Yes Deeann Saint, MD  aspirin 81 MG chewable tablet Place 81 mg into feeding tube daily.  10/05/16  Yes [provider]  baclofen (LIORESAL) 10 MG tablet PLACE 1 TABLET (10 MG TOTAL) INTO FEEDING TUBE 3 (THREE) TIMES DAILY. Patient taking differently: Place 10 mg into feeding tube See admin instructions. 10 mg, per tube, twice a day and an additional 10 mg once a day as needed for spasms 01/04/23 04/04/23 Yes London Sheer, MD  Calcium Citrate 200 MG TABS Place 200 mg into feeding tube daily at  12 noon.   Yes [provider]  cholecalciferol (VITAMIN D3) 10 MCG (400 UNIT) TABS tablet Take 1 tablet (400 Units total) by mouth daily. Patient taking differently: Place 400 Units into feeding tube daily. 03/12/22  Yes Iva Boop, MD  clotrimazole (MYCELEX) 10 MG troche Take 1 tablet (10 mg total) by mouth 3 (three) times daily as needed (infection). Patient taking differently: Take 10 mg by mouth See admin instructions. 10 mg Per tube two times a day 10/28/20  Yes Causey, Larna Daughters, NP  dexlansoprazole (DEXILANT) 60 MG capsule ADMINISTER 1 CAPSULE BY MOUTH VIA FEEDING TUBE DAILY Strength: 60 mg Patient taking differently: Place 60 mg into feeding tube daily. 12/10/22  Yes Iva Boop, MD  diphenoxylate-atropine (LOMOTIL) 2.5-0.025 MG tablet 2 tablets via tube twice a day Patient taking differently: Place 2 tablets into feeding tube 2 (two) times daily. 11/26/22  Yes Iva Boop, MD  ferrous sulfate 220 (44 Fe) MG/5ML solution TAKE 6.8ML VIA FEEDING TUBE EVERY DAY Strength: 220 (44 Fe) MG/5ML Patient taking differently: Place 299.2 mg into feeding tube daily. 09/06/22  Yes Iva Boop, MD  folic acid (FOLVITE) 400 MCG tablet Place 400 mcg into feeding tube daily.   Yes [provider]  guaiFENesin (COUGH SYRUP PO) Place 32 mLs into feeding tube in the morning and at bedtime.   Yes [provider]  lidocaine (LMX) 4 % cream  Apply 1 application  topically as needed (to affected sites- for pain).   Yes [provider]  naproxen (NAPROSYN) 125 MG/5ML suspension Place 250 mg into feeding tube 2 (two) times daily with a meal.   Yes [provider]  potassium chloride SA (KLOR-CON) 20 MEQ tablet TAKE 1 TABLET BY MOUTH TWICE DAILY FOR THE NEXT 3 DAYS. OKAY TO CRUSH IF UNABLE TO SWALLOW. CAN ALSO TAKE PER TUBE IF NEEDED. Patient taking differently: 20 mEq See admin instructions. 20 mEq Per tube at bedtime 08/13/19  Yes Deeann Saint, MD  sertraline (ZOLOFT) 25 MG tablet TAKE 1 TABLET BY MOUTH EVERY DAY Patient taking differently: Place 25 mg into feeding tube daily. 04/26/22  Yes Deeann Saint, MD  ondansetron (ZOFRAN-ODT) 4 MG disintegrating tablet Take 1 tablet (4 mg total) by mouth every 8 (eight) hours as needed for nausea or vomiting. Patient not taking: Reported on 01/12/2023 08/24/21   Renne Crigler, PA-C    Physical Exam: Vitals:   01/12/23 1200 01/12/23 1400 01/12/23 1500 01/12/23 1740  BP: 118/86 115/81 (!) 132/103 (!) 124/90  Pulse: 86 77 89 81  Resp: 19 18 18 16   Temp:  98.1 F (36.7 C)  98.5 F (36.9 C)  TempSrc:    Oral  SpO2: 97% 98% 94% 96%  Weight:      Height:       General: Patient is alert and awake, appears to be no immediate distress. Respiratory exam: Bilateral intravesicular Cardiovascular exam S1-S2 normal Abdomen PEG tube in situ All quadrants are soft all quadrants appear to be slightly tender to deep palpation.  No rebound Extremities warm without edema  Data Reviewed:  Labs on Admission:  Results for orders placed or performed during the hospital encounter of 01/12/23 (from the past 24 hours)  Lipase, blood     Status: None   Collection Time: 01/12/23  9:51 AM  Result Value Ref Range   Lipase 23 11 - 51 U/L  Comprehensive metabolic panel     Status: Abnormal   Collection Time:  01/12/23  9:51 AM  Result Value Ref Range   Sodium 140 135 - 145 mmol/L    Potassium 4.0 3.5 - 5.1 mmol/L   Chloride 108 98 - 111 mmol/L   CO2 22 22 - 32 mmol/L   Glucose, Bld 132 (H) 70 - 99 mg/dL   BUN 11 6 - 20 mg/dL   Creatinine, Ser 1.61 0.44 - 1.00 mg/dL   Calcium 8.9 8.9 - 09.6 mg/dL   Total Protein 7.5 6.5 - 8.1 g/dL   Albumin 4.0 3.5 - 5.0 g/dL   AST 30 15 - 41 U/L   ALT 13 0 - 44 U/L   Alkaline Phosphatase 122 38 - 126 U/L   Total Bilirubin 0.7 <1.2 mg/dL   GFR, Estimated >04 >54 mL/min   Anion gap 10 5 - 15  CBC     Status: None   Collection Time: 01/12/23  9:51 AM  Result Value Ref Range   WBC 6.0 4.0 - 10.5 K/uL   RBC 4.39 3.87 - 5.11 MIL/uL   Hemoglobin 12.8 12.0 - 15.0 g/dL   HCT 09.8 11.9 - 14.7 %   MCV 88.8 80.0 - 100.0 fL   MCH 29.2 26.0 - 34.0 pg   MCHC 32.8 30.0 - 36.0 g/dL   RDW 82.9 56.2 - 13.0 %   Platelets 150 150 - 400 K/uL   nRBC 0.0 0.0 - 0.2 %  Urinalysis, Routine w reflex microscopic -Urine, Clean Catch     Status: Abnormal   Collection Time: 01/12/23 10:07 AM  Result Value Ref Range   Color, Urine AMBER (A) YELLOW   APPearance HAZY (A) CLEAR   Specific Gravity, Urine 1.023 1.005 - 1.030   pH 5.0 5.0 - 8.0   Glucose, UA NEGATIVE NEGATIVE mg/dL   Hgb urine dipstick NEGATIVE NEGATIVE   Bilirubin Urine NEGATIVE NEGATIVE   Ketones, ur NEGATIVE NEGATIVE mg/dL   Protein, ur NEGATIVE NEGATIVE mg/dL   Nitrite POSITIVE (A) NEGATIVE   Leukocytes,Ua LARGE (A) NEGATIVE   RBC / HPF 6-10 0 - 5 RBC/hpf   WBC, UA >50 0 - 5 WBC/hpf   Bacteria, UA FEW (A) NONE SEEN   Squamous Epithelial / HPF 0-5 0 - 5 /HPF   Mucus PRESENT    *Note: Due to a large number of results and/or encounters for the requested time period, some results have not been displayed. A complete set of results can be found in Results Review.   Basic Metabolic Panel: Recent Labs  Lab 01/12/23 0951  NA 140  K 4.0  CL 108  CO2 22  GLUCOSE 132*  BUN 11  CREATININE 0.82  CALCIUM 8.9   Liver Function Tests: Recent Labs  Lab 01/12/23 0951  AST 30  ALT  13  ALKPHOS 122  BILITOT 0.7  PROT 7.5  ALBUMIN 4.0   Recent Labs  Lab 01/12/23 0951  LIPASE 23   No results for input(s): "AMMONIA" in the last 168 hours. CBC: Recent Labs  Lab 01/12/23 0951  WBC 6.0  HGB 12.8  HCT 39.0  MCV 88.8  PLT 150   Cardiac Enzymes: No results for input(s): "CKTOTAL", "CKMB", "CKMBINDEX", "TROPONINIHS" in the last 168 hours.  BNP (last 3 results) No results for input(s): "PROBNP" in the last 8760 hours. CBG: No results for input(s): "GLUCAP" in the last 168 hours.  Radiological Exams on Admission:  CT ABDOMEN PELVIS W CONTRAST Result Date: 01/12/2023 CLINICAL DATA:  Abdominal pain, acute, nonlocalized. EXAM: CT ABDOMEN AND PELVIS  WITH CONTRAST TECHNIQUE: Multidetector CT imaging of the abdomen and pelvis was performed using the standard protocol following bolus administration of intravenous contrast. RADIATION DOSE REDUCTION: This exam was performed according to the departmental dose-optimization program which includes automated exposure control, adjustment of the mA and/or kV according to patient size and/or use of iterative reconstruction technique. CONTRAST:  OMNIPAQUE IOHEXOL 300 MG/ML  SOLN COMPARISON:  CT scan abdomen and pelvis from 07/24/2020. FINDINGS: Lower chest: There are patchy atelectatic changes in the visualized lung bases. No overt consolidation. No pleural effusion. The heart is normal in size. No pericardial effusion. Hepatobiliary: The liver is normal in size. Non-cirrhotic configuration. No suspicious mass. These is mild diffuse hepatic steatosis. No intrahepatic bile duct dilation. There is mild prominence of the extrahepatic bile duct, most likely due to post cholecystectomy status. Gallbladder is surgically absent. Pancreas: Unremarkable. No pancreatic ductal dilatation or surrounding inflammatory changes. Spleen: Within normal limits. No focal lesion. Adrenals/Urinary Tract: Adrenal glands are unremarkable. No suspicious renal  mass. There is a 3.8 x 4.5 cm sinus cyst in the left kidney upper pole. There is overlying left kidney upper pole cortical scarring. There is also focal scarring in the right kidney upper and lower poles and left kidney lower pole. There is a 3 mm nonobstructing calculus in the left kidney lower pole calyx. No other nephroureterolithiasis on either side. No hydroureteronephrosis. Unremarkable urinary bladder. Stomach/Bowel: There are multiple dilated mid/distal small bowel loops measuring up to 3.0 cm in diameter. There is also a short bowel loop in the left mid abdomen which exhibits fecalization suggesting slow transit time. There are several small bowel loops in the right lower abdomen which exhibit mild-to-moderate circumferential wall thickening and mild perienteric fat stranding. No definite transition point seen. Findings favor adynamic ileus likely secondary to enteritis. Correlate clinically. There is small amount of ascites mainly in the pelvis and in the perisplenic/perihepatic region, likely reactive. No walled-off abscess or loculated collection. No pneumoperitoneum. Gastrostomy tube is seen. The colon is collapsed and contains positive oral contrast likely from prior administration. There are scattered colonic diverticula without diverticulitis. Vascular/Lymphatic: No abdominal or pelvic lymphadenopathy, by size criteria. No aneurysmal dilation of the major abdominal arteries. Reproductive: The uterus is unremarkable. No large adnexal mass. Other: There is a tiny fat containing umbilical hernia. Left upper quadrant gastrostomy tube noted. Musculoskeletal: No suspicious osseous lesions. There are moderate - marked multilevel degenerative changes in the visualized spine. Posterior spinal fixation of lower thoracic/lumbar and S1 spine noted. IMPRESSION: *There are multiple dilated mid/distal small bowel loops measuring up to 3.0 cm in diameter; however, without transition point, favoring adynamic ileus.  Multiple small bowel loops in the right abdomen exhibits findings compatible with enteritis. There is small amount of ascites; however, no walled-off abscess/loculated collection. No pneumoperitoneum, pneumatosis or portal venous gas. *Multiple other nonacute observations, as described above. Electronically Signed   By: Jules Schick M.D.   On: 01/12/2023 12:48      Assessment and Plan: * Enteritis Patient reports having had only 1 loose bowel movement today.  Will check for C. difficile and stool pathogen.  At this time no white count no fever.  However prominent complaint of abdominal pain. Norco ordered .  Patient started on ceftriaxone and metronidazole which will be continued.  UTI (urinary tract infection) Incidetnal.f/u urine cultures. Ceftriaxone as above.  Chornic G tube. Patient reports tolerating regular diet.     Advance Care Planning:   Code Status: Full Code  Consults: none at this time.  Family Communication: defer to patient.  Severity of Illness: The appropriate patient status for this patient is OBSERVATION. Observation status is judged to be reasonable and necessary in order to provide the required intensity of service to ensure the patient's safety. The patient's presenting symptoms, physical exam findings, and initial radiographic and laboratory data in the context of their medical condition is felt to place them at decreased risk for further clinical deterioration. Furthermore, it is anticipated that the patient will be medically stable for discharge from the hospital within 2 midnights of admission.   Author: Nolberto Hanlon, MD 01/12/2023 5:53 PM  For on call review [email or web address].

## 2023-01-12 NOTE — Assessment & Plan Note (Signed)
Incidetnal.f/u urine cultures. Ceftriaxone as above.

## 2023-01-12 NOTE — Assessment & Plan Note (Addendum)
Patient reports having had only 1 loose bowel movement today.  Will check for C. difficile and stool pathogen.  At this time no white count no fever.  However prominent complaint of abdominal pain. Norco ordered .  Patient started on ceftriaxone and metronidazole which will be continued.

## 2023-01-12 NOTE — ED Triage Notes (Signed)
Pt arrived via POV. C/o lower abd pain since yesterday am. Pain worsens after PO intake.   No N/V/D

## 2023-01-13 DIAGNOSIS — K529 Noninfective gastroenteritis and colitis, unspecified: Secondary | ICD-10-CM | POA: Diagnosis not present

## 2023-01-13 LAB — PROTIME-INR
INR: 1.1 (ref 0.8–1.2)
Prothrombin Time: 14.3 s (ref 11.4–15.2)

## 2023-01-13 LAB — URINE CULTURE

## 2023-01-13 LAB — BASIC METABOLIC PANEL
Anion gap: 5 (ref 5–15)
BUN: 9 mg/dL (ref 6–20)
CO2: 27 mmol/L (ref 22–32)
Calcium: 8.4 mg/dL — ABNORMAL LOW (ref 8.9–10.3)
Chloride: 108 mmol/L (ref 98–111)
Creatinine, Ser: 0.6 mg/dL (ref 0.44–1.00)
GFR, Estimated: >60 mL/min (ref >60)
Glucose, Bld: 111 mg/dL — ABNORMAL HIGH (ref 70–99)
Potassium: 3.7 mmol/L (ref 3.5–5.1)
Sodium: 140 mmol/L (ref 135–145)

## 2023-01-13 LAB — CBC
HCT: 34.6 % — ABNORMAL LOW (ref 36.0–46.0)
Hemoglobin: 11.1 g/dL — ABNORMAL LOW (ref 12.0–15.0)
MCH: 29.1 pg (ref 26.0–34.0)
MCHC: 32.1 g/dL (ref 30.0–36.0)
MCV: 90.6 fL (ref 80.0–100.0)
Platelets: 203 10*3/uL (ref 150–400)
RBC: 3.82 MIL/uL — ABNORMAL LOW (ref 3.87–5.11)
RDW: 15.2 % (ref 11.5–15.5)
WBC: 3.9 10*3/uL — ABNORMAL LOW (ref 4.0–10.5)
nRBC: 0 % (ref 0.0–0.2)

## 2023-01-13 LAB — GLUCOSE, CAPILLARY
Glucose-Capillary: 106 mg/dL — ABNORMAL HIGH (ref 70–99)
Glucose-Capillary: 113 mg/dL — ABNORMAL HIGH (ref 70–99)
Glucose-Capillary: 114 mg/dL — ABNORMAL HIGH (ref 70–99)
Glucose-Capillary: 91 mg/dL (ref 70–99)

## 2023-01-13 LAB — APTT: aPTT: 31 s (ref 24–36)

## 2023-01-13 MED ORDER — ONDANSETRON HCL 4 MG/2ML IJ SOLN
4.0000 mg | Freq: Four times a day (QID) | INTRAMUSCULAR | Status: DC | PRN
  Administered 2023-01-13: 4 mg via INTRAVENOUS
  Filled 2023-01-13: qty 2

## 2023-01-13 MED ORDER — SODIUM CHLORIDE 0.9% FLUSH: 3.0000 mL | INTRAVENOUS | Status: DC | PRN

## 2023-01-13 MED ORDER — ENSURE ENLIVE PO LIQD
237.0000 mL | Freq: Three times a day (TID) | ORAL | Status: DC
Start: 2023-01-13 — End: 2023-01-14
  Administered 2023-01-13: 237 mL via ORAL

## 2023-01-13 MED ORDER — SODIUM CHLORIDE 0.9 % IV SOLN: INTRAVENOUS | Status: DC | PRN

## 2023-01-13 MED ORDER — SODIUM CHLORIDE 0.9% FLUSH
3.0000 mL | Freq: Two times a day (BID) | INTRAVENOUS | Status: DC
Start: 2023-01-13 — End: 2023-01-14

## 2023-01-13 MED ORDER — AMLODIPINE BESYLATE 10 MG PO TABS
10.0000 mg | ORAL_TABLET | Freq: Every day | ORAL | Status: DC
  Administered 2023-01-13 – 2023-01-14 (×2): 10 mg
  Filled 2023-01-13 (×2): qty 1

## 2023-01-13 NOTE — Progress Notes (Signed)
Initial Nutrition Assessment  INTERVENTION:   -Ensure Plus High Protein TID PO or via tube, each supplement provides 350 kcal and 20 grams of protein.   -Encourage PO as tolerated  -If PEG needs to be utilized for tube feeding, consult RD for tube feeding initiation/management  NUTRITION DIAGNOSIS:   Increased nutrient needs related to acute illness as evidenced by estimated needs.  GOAL:   Patient will meet greater than or equal to 90% of their needs  MONITOR:   PO intake, Supplement acceptance, Labs, Weight trends, I & O's  REASON FOR ASSESSMENT:   Consult Assessment of nutrition requirement/status  ASSESSMENT:   57 y.o. female with medical history significant of remote ENT (tongue) ca s/p resection and PEG tube placement.Patient reports being in her usual state of health till the evening before last when she reports new onset of moderate to severe generalized crampy abdominal discomfort/pain with no aggravating relieving factor associated with loose bowel movements 2-3 a day.  Admitted for enteritis.  Patient currently consuming 40-100% of meals. This morning ate grits, banana, scrambled eggs and potatoes for breakfast.  Pt today reporting no nausea or diarrhea. GI panel pending. Pt on regular diet, will order Ensure supplements. If PO were to decrease can utilize PEG for tube feeds. Pt uses tube for medications and fluids, and as needed for feeds. If tube feeding needed, uses Jevity 1.5 at home. Ensure could also be provided via tube as well.  Per weight records, pt has been gaining weight lately.   Medications: Vitamin D  Labs reviewed: CBGs: 91-107   NUTRITION - FOCUSED PHYSICAL EXAM:  Unable to complete, working remotely.  Diet Order:   Diet Order             Diet regular Room service appropriate? Yes; Fluid consistency: Thin  Diet effective now                   EDUCATION NEEDS:   No education needs have been identified at this time  Skin:  Skin  Assessment: Reviewed RN Assessment  Last BM:  12/25  Height:   Ht Readings from Last 1 Encounters:  01/12/23 5\' 7"  (1.702 m)    Weight:   Wt Readings from Last 1 Encounters:  01/12/23 77.1 kg    BMI:  Body mass index is 26.63 kg/m.  Estimated Nutritional Needs:   Kcal:  1750-1950  Protein:  90-100g  Fluid:  1.9L/day  Tilda Franco, MS, RD, LDN Inpatient Clinical Dietitian Contact via Secure chat

## 2023-01-13 NOTE — Progress Notes (Signed)
PROGRESS NOTE    Kristen [name]  EXB:[id number] DOB: 1965-04-24 DOA: 01/12/2023 PCP: [name], MD     Brief Narrative:   Kristen [name] is a 57 y.o. female with medical history significant of remote ENT (tongue) ca s/p resection and PEG tube placement. Patient advises me that she is "cancer free".  Patient actually eats regular food orally.  Patient reports being in her usual state of health till the evening before last when she reports new onset of moderate to severe generalized crampy abdominal discomfort/pain with no aggravating relieving factor associated with loose bowel movements 2-3 a day.  There was no bleeding no fever no vomiting.  Patient reports that her last bowel movement was around noon time today.  And she has had only 1 bowel movement today.  CT abdomen pelvis is showing enteritis.    New events last 24 hours / Subjective: Patient admits to frontal headache today. Some lower abdominal cramping but no nausea, vomiting, diarrhea.   Assessment & Plan:    Principal Problem:   Enteritis Active Problems:   Essential hypertension   Malignant neoplasm of base of tongue (HCC)   UTI (urinary tract infection)   Acute enteritis -C Diff, GI PCR pending -Rocephin/Flagyl   UTI -Urine culture pending -Rocephin  HTN -Norvasc    DVT prophylaxis:  enoxaparin (LOVENOX) injection 40 mg Start: 01/13/23 0800 SCDs Start: 01/12/23 1620  Code Status: Full Family Communication: None at bedside  Disposition Plan: Home Status is: Observation The patient will require care spanning > 2 midnights and should be moved to inpatient because: IV antibiotics     Antimicrobials:  Anti-infectives (From admission, onward)    Start     Dose/Rate Route Frequency Ordered Stop   01/13/23 1600  cefTRIAXone (ROCEPHIN) 2 g in sodium chloride 0.9 % 100 mL IVPB        2 g 200 mL/hr over 30 Minutes Intravenous Every 24 hours 01/12/23 1800     01/13/23 0600  metroNIDAZOLE  (FLAGYL) IVPB 500 mg        500 mg 100 mL/hr over 60 Minutes Intravenous Every 12 hours 01/12/23 1800     01/12/23 1530  cefTRIAXone (ROCEPHIN) 1 g in sodium chloride 0.9 % 100 mL IVPB        1 g 200 mL/hr over 30 Minutes Intravenous  Once 01/12/23 1526 01/12/23 1655   01/12/23 1530  metroNIDAZOLE (FLAGYL) IVPB 500 mg        500 mg 100 mL/hr over 60 Minutes Intravenous  Once 01/12/23 1526 01/12/23 1756        Objective: Vitals:   01/12/23 2112 01/13/23 0159 01/13/23 0606 01/13/23 0956  BP: (!) 113/90 (!) 124/92 108/78 (!) 147/97  Pulse: 79 83 76 77  Resp: 18 16 16 17   Temp: 98.2 F (36.8 C) 98.3 F (36.8 C) 98.4 F (36.9 C) 98.6 F (37 C)  TempSrc: Oral Oral Oral Oral  SpO2: 98% 100% 98% 97%  Weight:      Height:        Intake/Output Summary (Last 24 hours) at 01/13/2023 1049 Last data filed at 01/13/2023 0907 Gross per 24 hour  Intake 1750.21 ml  Output 200 ml  Net 1550.21 ml   Filed Weights   01/12/23 0929  Weight: 77.1 kg    Examination:  General exam: Appears calm and comfortable  Respiratory system: Clear to auscultation. Respiratory effort normal. No respiratory distress. No conversational dyspnea.  Cardiovascular system: S1 & S2 heard,  RRR. No murmurs. No pedal edema. Gastrointestinal system: Abdomen is nondistended, soft  Central nervous system: Alert and oriented. No focal neurological deficits. Speech clear.  Extremities: Symmetric in appearance  Skin: No rashes, lesions or ulcers on exposed skin  Psychiatry: Judgement and insight appear normal. Mood & affect appropriate.   Data Reviewed: I have personally reviewed following labs and imaging studies  CBC: Recent Labs  Lab 01/12/23 0951 01/12/23 1757 01/13/23 0424  WBC 6.0 5.1 3.9*  HGB 12.8 12.2 11.1*  HCT 39.0 38.6 34.6*  MCV 88.8 90.4 90.6  PLT 150 233 203   Basic Metabolic Panel: Recent Labs  Lab 01/12/23 0951 01/12/23 1757 01/13/23 0424  NA 140  --  140  K 4.0  --  3.7  CL 108   --  108  CO2 22  --  27  GLUCOSE 132*  --  111*  BUN 11  --  9  CREATININE 0.82 0.55 0.60  CALCIUM 8.9  --  8.4*   GFR: Estimated Creatinine Clearance: 83 mL/min (by C-G formula based on SCr of 0.6 mg/dL). Liver Function Tests: Recent Labs  Lab 01/12/23 0951  AST 30  ALT 13  ALKPHOS 122  BILITOT 0.7  PROT 7.5  ALBUMIN 4.0   Recent Labs  Lab 01/12/23 0951  LIPASE 23   No results for input(s): "AMMONIA" in the last 168 hours. Coagulation Profile: Recent Labs  Lab 01/13/23 0424  INR 1.1   Cardiac Enzymes: No results for input(s): "CKTOTAL", "CKMB", "CKMBINDEX", "TROPONINI" in the last 168 hours. BNP (last 3 results) No results for input(s): "PROBNP" in the last 8760 hours. HbA1C: No results for input(s): "HGBA1C" in the last 72 hours. CBG: Recent Labs  Lab 01/12/23 2313 01/13/23 0641  GLUCAP 107* 91   Lipid Profile: No results for input(s): "CHOL", "HDL", "LDLCALC", "TRIG", "CHOLHDL", "LDLDIRECT" in the last 72 hours. Thyroid Function Tests: No results for input(s): "TSH", "T4TOTAL", "FREET4", "T3FREE", "THYROIDAB" in the last 72 hours. Anemia Panel: No results for input(s): "VITAMINB12", "FOLATE", "FERRITIN", "TIBC", "IRON", "RETICCTPCT" in the last 72 hours. Sepsis Labs: No results for input(s): "PROCALCITON", "LATICACIDVEN" in the last 168 hours.  No results found for this or any previous visit (from the past 240 hours).    Radiology Studies: CT ABDOMEN PELVIS W CONTRAST Result Date: 01/12/2023 CLINICAL DATA:  Abdominal pain, acute, nonlocalized. EXAM: CT ABDOMEN AND PELVIS WITH CONTRAST TECHNIQUE: Multidetector CT imaging of the abdomen and pelvis was performed using the standard protocol following bolus administration of intravenous contrast. RADIATION DOSE REDUCTION: This exam was performed according to the departmental dose-optimization program which includes automated exposure control, adjustment of the mA and/or kV according to patient size and/or use  of iterative reconstruction technique. CONTRAST:  OMNIPAQUE IOHEXOL 300 MG/ML  SOLN COMPARISON:  CT scan abdomen and pelvis from 07/24/2020. FINDINGS: Lower chest: There are patchy atelectatic changes in the visualized lung bases. No overt consolidation. No pleural effusion. The heart is normal in size. No pericardial effusion. Hepatobiliary: The liver is normal in size. Non-cirrhotic configuration. No suspicious mass. These is mild diffuse hepatic steatosis. No intrahepatic bile duct dilation. There is mild prominence of the extrahepatic bile duct, most likely due to post cholecystectomy status. Gallbladder is surgically absent. Pancreas: Unremarkable. No pancreatic ductal dilatation or surrounding inflammatory changes. Spleen: Within normal limits. No focal lesion. Adrenals/Urinary Tract: Adrenal glands are unremarkable. No suspicious renal mass. There is a 3.8 x 4.5 cm sinus cyst in the left kidney upper pole. There  is overlying left kidney upper pole cortical scarring. There is also focal scarring in the right kidney upper and lower poles and left kidney lower pole. There is a 3 mm nonobstructing calculus in the left kidney lower pole calyx. No other nephroureterolithiasis on either side. No hydroureteronephrosis. Unremarkable urinary bladder. Stomach/Bowel: There are multiple dilated mid/distal small bowel loops measuring up to 3.0 cm in diameter. There is also a short bowel loop in the left mid abdomen which exhibits fecalization suggesting slow transit time. There are several small bowel loops in the right lower abdomen which exhibit mild-to-moderate circumferential wall thickening and mild perienteric fat stranding. No definite transition point seen. Findings favor adynamic ileus likely secondary to enteritis. Correlate clinically. There is small amount of ascites mainly in the pelvis and in the perisplenic/perihepatic region, likely reactive. No walled-off abscess or loculated collection. No  pneumoperitoneum. Gastrostomy tube is seen. The colon is collapsed and contains positive oral contrast likely from prior administration. There are scattered colonic diverticula without diverticulitis. Vascular/Lymphatic: No abdominal or pelvic lymphadenopathy, by size criteria. No aneurysmal dilation of the major abdominal arteries. Reproductive: The uterus is unremarkable. No large adnexal mass. Other: There is a tiny fat containing umbilical hernia. Left upper quadrant gastrostomy tube noted. Musculoskeletal: No suspicious osseous lesions. There are moderate - marked multilevel degenerative changes in the visualized spine. Posterior spinal fixation of lower thoracic/lumbar and S1 spine noted. IMPRESSION: *There are multiple dilated mid/distal small bowel loops measuring up to 3.0 cm in diameter; however, without transition point, favoring adynamic ileus. Multiple small bowel loops in the right abdomen exhibits findings compatible with enteritis. There is small amount of ascites; however, no walled-off abscess/loculated collection. No pneumoperitoneum, pneumatosis or portal venous gas. *Multiple other nonacute observations, as described above. Electronically Signed   By: Jules Schick M.D.   On: 01/12/2023 12:48      Scheduled Meds:  aspirin  81 mg Per Tube Daily   baclofen  10 mg Per Tube BID   cholecalciferol  400 Units Per Tube Daily   enoxaparin (LOVENOX) injection  40 mg Subcutaneous Q24H   pantoprazole (PROTONIX) IV  40 mg Intravenous Daily   sertraline  25 mg Per Tube Daily   sodium chloride flush  3 mL Intravenous Q12H   sodium chloride flush  3-10 mL Intravenous Q12H   Continuous Infusions:  cefTRIAXone (ROCEPHIN)  IV     metronidazole 500 mg (01/13/23 0501)     LOS: 0 days   Time spent: 25 minutes   Noralee Stain, DO [facility] 01/13/2023, 10:49 AM   Available via Epic secure chat 7am-7pm After these hours, please refer to coverage provider listed on [email or web address]

## 2023-01-14 ENCOUNTER — Other Ambulatory Visit: Payer: Self-pay | Admitting: Family Medicine

## 2023-01-14 DIAGNOSIS — K529 Noninfective gastroenteritis and colitis, unspecified: Secondary | ICD-10-CM | POA: Diagnosis not present

## 2023-01-14 DIAGNOSIS — F411 Generalized anxiety disorder: Secondary | ICD-10-CM

## 2023-01-14 DIAGNOSIS — F321 Major depressive disorder, single episode, moderate: Secondary | ICD-10-CM

## 2023-01-14 MED ORDER — AMOXICILLIN-POT CLAVULANATE 875-125 MG PO TABS: 1.0000 | ORAL_TABLET | Freq: Two times a day (BID) | ORAL | 0 refills | Status: AC

## 2023-01-14 NOTE — TOC Initial Note (Addendum)
Transition of Care [facility]) - Initial/Assessment Note    Patient Details  Name: Kristen [name] MRN: [id number] Date of Birth: 1965-03-09  Transition of Care [facility]) CM/SW Contact:    Adrian Prows, RN Phone Number: 01/14/2023, 10:02 AM  Clinical Narrative:                 SDOH risks identified; spoke w/ pt in room; pt says she lives at home w/ her family; she plans to return at d/c; pt verified she has PCP/insurance; she identified POC Tania Ade (S.O.) [phone]; pt says she has transportation home; pt denies IPV, and difficulty paying for housing or utilities; pt says she does have a difficult time paying for food; she says she receives $23/month  food stamps; pt says her TF is from [employer]; she does not have HH service, or home oxygen; pt agrees to receive resources for [facility] and Financial assistance; resources placed in d/c instructions; pt also given the [facility] [employer] in [place], and the [facility] [employer] in [place]; pt will make appt at agencies of choice; also this RN, CM explained MOON to pt; she verbalized understanding and signed document; copy of document given to pt; no TOC needs.  Expected Discharge Plan: Home/Self Care Barriers to Discharge: No Barriers Identified   Patient Goals and CMS Choice Patient states their goals for this hospitalization and ongoing recovery are:: home CMS Medicare.gov Compare Post Acute Care list provided to:: Patient   [place] ownership interest in [facility].provided to:: Patient    Expected Discharge Plan and Services   Discharge Planning Services: CM Consult Post Acute Care Choice: Resumption of Svcs/PTA Provider (TF from [employer]) Living arrangements for the past 2 months: Single Family Home Expected Discharge Date: 01/14/23               DME Arranged: N/A DME Agency: NA       HH Arranged: NA HH Agency: NA        Prior Living Arrangements/Services Living arrangements  for the past 2 months: Single Family Home Lives with:: Relatives Patient language and need for interpreter reviewed:: Yes Do you feel safe going back to the place where you live?: Yes      Need for Family Participation in Patient Care: Yes (Comment)   Current home services:  (n/a) Criminal Activity/Legal Involvement Pertinent to Current Situation/Hospitalization: No - Comment as needed  Activities of Daily Living   ADL Screening (condition at time of admission) Independently performs ADLs?: Yes (appropriate for developmental age) Is the patient deaf or have difficulty hearing?: No Does the patient have difficulty seeing, even when wearing glasses/contacts?: No Does the patient have difficulty concentrating, remembering, or making decisions?: No  Permission Sought/Granted Permission sought to share information with : Case Manager Permission granted to share information with : Yes, Verbal Permission Granted  Share Information with NAME: Case Manager     Permission granted to share info w Relationship: Tania Ade (S.O.) [phone]     Emotional Assessment Appearance:: Appears stated age Attitude/Demeanor/Rapport: Gracious Affect (typically observed): Accepting Orientation: : Oriented to Self, Oriented to Place, Oriented to  Time, Oriented to Situation Alcohol / Substance Use: Not Applicable Psych Involvement: No (comment)  Admission diagnosis:  Enteritis [K52.9] Ileus [facility]) [K56.7] Patient Active Problem List   Diagnosis Date Noted   Enteritis 01/12/2023   Generalized weakness 07/18/2022   Dehydration 07/18/2022   Tension headache 07/18/2022   Depression 07/18/2022   History of  tongue cancer 07/18/2022   PEG tube malfunction (HCC) 02/28/2022   Protein-calorie malnutrition, severe 04/29/2021   Pseudoarthrosis of lumbar spine 04/28/2021   Fusion of spine of lumbar region 04/28/2021   Pseudarthrosis after fusion or arthrodesis 04/20/2021    Class: Chronic    Tenosynovitis, de Quervain 12/14/2020   Arthritis of carpometacarpal (CMC) joint of left thumb 12/14/2020   Aortic atherosclerosis (HCC) 08/20/2020   Hyperkalemia 07/24/2020   Alcohol use 08/05/2019   Bilateral lower extremity edema 08/05/2019   Peripheral edema 03/12/2019   Iron deficiency anemia 12/11/2018   Loosening of hardware in spine [facility])    Other spondylosis with radiculopathy, lumbar region    History of lumbar spinal fusion 11/06/2018   UTI (urinary tract infection) 05/15/2017   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   Malignant neoplasm of base of tongue (HCC) 10/29/2016   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   Headache associated with sexual activity 05/14/2016   Tongue lesion 05/14/2016   Thrombocytopenia (HCC) 01/22/2016   Chronic diarrhea 01/22/2016   Chest pain    Essential hypertension    Spinal stenosis of lumbar region 01/20/2016    Class: Chronic   DDD (degenerative disc disease), lumbar 01/20/2016    Class: Chronic   Spinal stenosis of lumbar region with neurogenic claudication 01/20/2016   Low back pain 12/22/2015   Hypersomnolence 10/13/2015   Muscle cramp 08/01/2015   GERD (gastroesophageal reflux disease) 07/09/2015   Anemia of chronic disease 01/08/2013   PCP:  [name], MD Pharmacy:   [phone] [place], [place] - [address] AT [facility] OF [address] [place] [address] Phone: [phone] Fax: [phone]  [facility] [place] - [address] [place] [address] Phone: [phone] Fax: [phone]  [id number] - [place], [place] - [address] AT [facility] [address] [place] [address] Phone: [phone] Fax: [phone]     Social Drivers of Health (SDOH) Social History: SDOH Screenings   Food Insecurity: Food Insecurity  Present (01/14/2023)  Housing: Low Risk  (01/14/2023)  Transportation Needs: No Transportation Needs (01/14/2023)  Utilities: Not At Risk (01/14/2023)  Alcohol Screen: Low Risk  (06/04/2021)  Depression (PHQ2-9): Medium Risk (08/28/2021)  Financial Resource Strain: Medium Risk (06/04/2021)  Physical Activity: Insufficiently Active (06/04/2021)  Social Connections: Moderately Integrated (06/04/2021)  Recent Concern: Social Connections - Moderately Isolated (03/31/2021)  Stress: No Stress Concern Present (06/04/2021)  Recent Concern: Stress - Stress Concern Present (03/31/2021)  Tobacco Use: Low Risk  (01/12/2023)   SDOH Interventions: Food Insecurity Interventions: [facility] Provided, Inpatient TOC Housing Interventions: Intervention Not Indicated, Inpatient TOC Transportation Interventions: Intervention Not Indicated, Inpatient TOC Utilities Interventions: Intervention Not Indicated, Inpatient TOC   Readmission Risk Interventions     No data to display

## 2023-01-14 NOTE — Discharge Instructions (Signed)
[facility] in [place] [facility] in [place]

## 2023-01-14 NOTE — Care Management Obs Status (Signed)
MEDICARE OBSERVATION STATUS NOTIFICATION   Patient Details  Name: Kristen [name] MRN: [id number] Date of Birth: 18-Sep-1965   Medicare Observation Status Notification Given:  Yes    Adrian Prows, RN 01/14/2023, 9:58 AM

## 2023-01-14 NOTE — Progress Notes (Signed)
Reviewed written d/c orders with pt and all questions answered. Pt verbalized understanding. Pt left in stable condition with all belongings.

## 2023-01-14 NOTE — Discharge Summary (Signed)
Physician Discharge Summary  Kristen [name] UXN:[id number] DOB: 1965/03/06 DOA: 01/12/2023  PCP: [name], MD  Admit date: 01/12/2023 Discharge date: 01/14/2023  Admitted From: Home Disposition: Home  Recommendations for Outpatient Follow-up:  Follow up with PCP   Discharge Condition: Stable CODE STATUS: Full code Diet recommendation:  Diet Orders (From admission, onward)     Start     Ordered   01/14/23 0000  Diet - low sodium heart healthy        01/14/23 0949   01/12/23 1620  Diet regular Room service appropriate? Yes; Fluid consistency: Thin  Diet effective now       Question Answer Comment  Room service appropriate? Yes   Fluid consistency: Thin      01/12/23 1620            Brief/Interim Summary: Kristen [name] is a 57 y.o. female with medical history significant of remote ENT (tongue) ca s/p resection and PEG tube placement. Patient advises me that she is "cancer free".  Patient actually eats regular food orally.  Patient reports being in her usual state of health till the evening before last when she reports new onset of moderate to severe generalized crampy abdominal discomfort/pain with no aggravating relieving factor associated with loose bowel movements 2-3 a day.  There was no bleeding no fever no vomiting.  Patient reports that her last bowel movement was around noon time today.  And she has had only 1 bowel movement today.  CT abdomen pelvis is showing enteritis.  Patient's symptoms continue to improve.  C. difficile and GI PCR panel was unable to be collected as patient's diarrhea stopped.  Patient was tolerating diet.  Urine culture showed mixed flora.  Patient was discharged home with oral Augmentin for enteritis.  Discharge Diagnoses:   Principal Problem:   Enteritis Active Problems:   Essential hypertension   Malignant neoplasm of base of tongue (HCC)   UTI (urinary tract infection)    Acute enteritis -C Diff, GI PCR unable to be  collected -Rocephin/Flagyl --> Augmentin   UTI, ruled out -Urine culture multiple species -Rocephin --> Augmentin   HTN -Norvasc    Discharge Instructions  Discharge Instructions     Call MD for:  difficulty breathing, headache or visual disturbances   Complete by: As directed    Call MD for:  extreme fatigue   Complete by: As directed    Call MD for:  hives   Complete by: As directed    Call MD for:  persistant dizziness or light-headedness   Complete by: As directed    Call MD for:  persistant nausea and vomiting   Complete by: As directed    Call MD for:  severe uncontrolled pain   Complete by: As directed    Call MD for:  temperature >100.4   Complete by: As directed    Diet - low sodium heart healthy   Complete by: As directed    Discharge instructions   Complete by: As directed    You were cared for by a hospitalist during your hospital stay. If you have any questions about your discharge medications or the care you received while you were in the hospital after you are discharged, you can call the unit and ask to speak with the hospitalist on call if the hospitalist that took care of you is not available. Once you are discharged, your primary care physician will handle any further medical issues. Please note that NO REFILLS  for any discharge medications will be authorized once you are discharged, as it is imperative that you return to your primary care physician (or establish a relationship with a primary care physician if you do not have one) for your aftercare needs so that they can reassess your need for medications and monitor your lab values.   Increase activity slowly   Complete by: As directed       Allergies as of 01/14/2023       Reactions   Sulfa Antibiotics Hives   Tomato Hives, Swelling        Medication List     TAKE these medications    acetaminophen 500 MG tablet Commonly known as: TYLENOL Take 2 tablets (1,000 mg total) by mouth every 8  (eight) hours as needed for moderate pain or mild pain. What changed: how to take this   AMBULATORY NON FORMULARY MEDICATION Jevity 1.5 Sig: 6 cans per day, Bolus feeding  60ml syringes #30  Tape and gauze   amLODipine 10 MG tablet Commonly known as: NORVASC TAKE 1 TABLET BY MOUTH EVERY DAY   amoxicillin-clavulanate 875-125 MG tablet Commonly known as: AUGMENTIN Take 1 tablet by mouth 2 (two) times daily for 5 days.   aspirin 81 MG chewable tablet Place 81 mg into feeding tube daily.   baclofen 10 MG tablet Commonly known as: LIORESAL PLACE 1 TABLET (10 MG TOTAL) INTO FEEDING TUBE 3 (THREE) TIMES DAILY. What changed:  when to take this additional instructions   Calcium Citrate 200 MG Tabs Place 200 mg into feeding tube daily at 12 noon.   cholecalciferol 10 MCG (400 UNIT) Tabs tablet Commonly known as: VITAMIN D3 Take 1 tablet (400 Units total) by mouth daily. What changed: how to take this   clotrimazole 10 MG troche Commonly known as: MYCELEX Take 1 tablet (10 mg total) by mouth 3 (three) times daily as needed (infection). What changed:  when to take this additional instructions   COUGH SYRUP PO Place 32 mLs into feeding tube in the morning and at bedtime.   dexlansoprazole 60 MG capsule Commonly known as: DEXILANT ADMINISTER 1 CAPSULE BY MOUTH VIA FEEDING TUBE DAILY Strength: 60 mg What changed:  how much to take how to take this when to take this additional instructions   diphenoxylate-atropine 2.5-0.025 MG tablet Commonly known as: LOMOTIL 2 tablets via tube twice a day What changed:  how much to take how to take this when to take this additional instructions   ferrous sulfate 220 (44 Fe) MG/5ML solution TAKE 6.8ML VIA FEEDING TUBE EVERY DAY Strength: 220 (44 Fe) MG/5ML What changed:  how much to take how to take this when to take this additional instructions   folic acid 400 MCG tablet Commonly known as: FOLVITE Place 400 mcg into  feeding tube daily.   lidocaine 4 % cream Commonly known as: LMX Apply 1 application  topically as needed (to affected sites- for pain).   naproxen 125 MG/5ML suspension Commonly known as: NAPROSYN Place 250 mg into feeding tube 2 (two) times daily with a meal.   ondansetron 4 MG disintegrating tablet Commonly known as: ZOFRAN-ODT Take 1 tablet (4 mg total) by mouth every 8 (eight) hours as needed for nausea or vomiting.   potassium chloride SA 20 MEQ tablet Commonly known as: KLOR-CON M TAKE 1 TABLET BY MOUTH TWICE DAILY FOR THE NEXT 3 DAYS. OKAY TO CRUSH IF UNABLE TO SWALLOW. CAN ALSO TAKE PER TUBE IF NEEDED. What changed:  how  much to take when to take this additional instructions   sertraline 25 MG tablet Commonly known as: ZOLOFT TAKE 1 TABLET BY MOUTH EVERY DAY What changed:  how much to take how to take this when to take this additional instructions        Follow-up Information     [name], MD Follow up.   Specialty: Family Medicine Contact information: [address] [place] [address] [phone]                Allergies  Allergen Reactions   Sulfa Antibiotics Hives   Tomato Hives and Swelling    Consultations: None    Procedures/Studies: CT ABDOMEN PELVIS W CONTRAST Result Date: 01/12/2023 CLINICAL DATA:  Abdominal pain, acute, nonlocalized. EXAM: CT ABDOMEN AND PELVIS WITH CONTRAST TECHNIQUE: Multidetector CT imaging of the abdomen and pelvis was performed using the standard protocol following bolus administration of intravenous contrast. RADIATION DOSE REDUCTION: This exam was performed according to the departmental dose-optimization program which includes automated exposure control, adjustment of the mA and/or kV according to patient size and/or use of iterative reconstruction technique. CONTRAST:  OMNIPAQUE IOHEXOL 300 MG/ML  SOLN COMPARISON:  CT scan abdomen and pelvis from 07/24/2020. FINDINGS: Lower  chest: There are patchy atelectatic changes in the visualized lung bases. No overt consolidation. No pleural effusion. The heart is normal in size. No pericardial effusion. Hepatobiliary: The liver is normal in size. Non-cirrhotic configuration. No suspicious mass. These is mild diffuse hepatic steatosis. No intrahepatic bile duct dilation. There is mild prominence of the extrahepatic bile duct, most likely due to post cholecystectomy status. Gallbladder is surgically absent. Pancreas: Unremarkable. No pancreatic ductal dilatation or surrounding inflammatory changes. Spleen: Within normal limits. No focal lesion. Adrenals/Urinary Tract: Adrenal glands are unremarkable. No suspicious renal mass. There is a 3.8 x 4.5 cm sinus cyst in the left kidney upper pole. There is overlying left kidney upper pole cortical scarring. There is also focal scarring in the right kidney upper and lower poles and left kidney lower pole. There is a 3 mm nonobstructing calculus in the left kidney lower pole calyx. No other nephroureterolithiasis on either side. No hydroureteronephrosis. Unremarkable urinary bladder. Stomach/Bowel: There are multiple dilated mid/distal small bowel loops measuring up to 3.0 cm in diameter. There is also a short bowel loop in the left mid abdomen which exhibits fecalization suggesting slow transit time. There are several small bowel loops in the right lower abdomen which exhibit mild-to-moderate circumferential wall thickening and mild perienteric fat stranding. No definite transition point seen. Findings favor adynamic ileus likely secondary to enteritis. Correlate clinically. There is small amount of ascites mainly in the pelvis and in the perisplenic/perihepatic region, likely reactive. No walled-off abscess or loculated collection. No pneumoperitoneum. Gastrostomy tube is seen. The colon is collapsed and contains positive oral contrast likely from prior administration. There are scattered colonic  diverticula without diverticulitis. Vascular/Lymphatic: No abdominal or pelvic lymphadenopathy, by size criteria. No aneurysmal dilation of the major abdominal arteries. Reproductive: The uterus is unremarkable. No large adnexal mass. Other: There is a tiny fat containing umbilical hernia. Left upper quadrant gastrostomy tube noted. Musculoskeletal: No suspicious osseous lesions. There are moderate - marked multilevel degenerative changes in the visualized spine. Posterior spinal fixation of lower thoracic/lumbar and S1 spine noted. IMPRESSION: *There are multiple dilated mid/distal small bowel loops measuring up to 3.0 cm in diameter; however, without transition point, favoring adynamic ileus. Multiple small bowel loops in the right abdomen exhibits  findings compatible with enteritis. There is small amount of ascites; however, no walled-off abscess/loculated collection. No pneumoperitoneum, pneumatosis or portal venous gas. *Multiple other nonacute observations, as described above. Electronically Signed   By: Jules Schick M.D.   On: 01/12/2023 12:48   IR PATIENT EVAL TECH 0-60 MINS Result Date: 12/17/2022 Assunta Found, RT     12/17/2022  8:49 AM Patient was seen in IR for G-tube malfunction. Tube was broken and replaced with another 18 Fr MIC G-tube. Balloon as inflated with 9 mL of sterile water, tube was flushed with heparinized saline. Split gauze dressing was placed and patient was given all necessary supplies for feeding.      Discharge Exam: Vitals:   01/14/23 0529 01/14/23 1139  BP: (!) 130/90 (!) 130/90  Pulse: 74   Resp: 18   Temp: 98.7 F (37.1 C)   SpO2: 97%     General: Pt is alert, awake, not in acute distress Cardiovascular: RRR, S1/S2 +, no edema Respiratory: CTA bilaterally, no wheezing, no rhonchi, no respiratory distress, no conversational dyspnea  Abdominal: Soft, NT, ND, bowel sounds + Extremities: no edema, no cyanosis Psych: Normal mood and affect, stable  judgement and insight     The results of significant diagnostics from this hospitalization (including imaging, microbiology, ancillary and laboratory) are listed below for reference.     Microbiology: Recent Results (from the past 240 hours)  Urine Culture     Status: Abnormal   Collection Time: 01/12/23 10:07 AM   Specimen: Urine, Clean Catch  Result Value Ref Range Status   Specimen Description   Final    URINE, CLEAN CATCH Performed at [facility], [address], [place], [place] [address]    Special Requests   Final    NONE Performed at [facility], [address], [place], [place] [address]    Culture MULTIPLE SPECIES PRESENT, SUGGEST RECOLLECTION (A)  Final   Report Status 01/13/2023 FINAL  Final     Labs: BNP (last 3 results) No results for input(s): "BNP" in the last 8760 hours. Basic Metabolic Panel: Recent Labs  Lab 01/12/23 0951 01/12/23 1757 01/13/23 0424  NA 140  --  140  K 4.0  --  3.7  CL 108  --  108  CO2 22  --  27  GLUCOSE 132*  --  111*  BUN 11  --  9  CREATININE 0.82 0.55 0.60  CALCIUM 8.9  --  8.4*   Liver Function Tests: Recent Labs  Lab 01/12/23 0951  AST 30  ALT 13  ALKPHOS 122  BILITOT 0.7  PROT 7.5  ALBUMIN 4.0   Recent Labs  Lab 01/12/23 0951  LIPASE 23   No results for input(s): "AMMONIA" in the last 168 hours. CBC: Recent Labs  Lab 01/12/23 0951 01/12/23 1757 01/13/23 0424  WBC 6.0 5.1 3.9*  HGB 12.8 12.2 11.1*  HCT 39.0 38.6 34.6*  MCV 88.8 90.4 90.6  PLT 150 233 203   Cardiac Enzymes: No results for input(s): "CKTOTAL", "CKMB", "CKMBINDEX", "TROPONINI" in the last 168 hours. BNP: Invalid input(s): "POCBNP" CBG: Recent Labs  Lab 01/12/23 2313 01/13/23 0641 01/13/23 1136 01/13/23 1714 01/13/23 2331  GLUCAP 107* 91 114* 106* 113*   D-Dimer No results for input(s): "DDIMER" in the last 72 hours. Hgb A1c No results for input(s): "HGBA1C" in the last 72  hours. Lipid Profile No results for input(s): "CHOL", "HDL", "LDLCALC", "TRIG", "CHOLHDL", "LDLDIRECT" in the last 72  hours. Thyroid function studies No results for input(s): "TSH", "T4TOTAL", "T3FREE", "THYROIDAB" in the last 72 hours.  Invalid input(s): "FREET3" Anemia work up No results for input(s): "VITAMINB12", "FOLATE", "FERRITIN", "TIBC", "IRON", "RETICCTPCT" in the last 72 hours. Urinalysis    Component Value Date/Time   COLORURINE AMBER (A) 01/12/2023 1007   APPEARANCEUR HAZY (A) 01/12/2023 1007   LABSPEC 1.023 01/12/2023 1007   PHURINE 5.0 01/12/2023 1007   GLUCOSEU NEGATIVE 01/12/2023 1007   HGBUR NEGATIVE 01/12/2023 1007   BILIRUBINUR NEGATIVE 01/12/2023 1007   BILIRUBINUR negative 01/13/2021 0803   KETONESUR NEGATIVE 01/12/2023 1007   PROTEINUR NEGATIVE 01/12/2023 1007   UROBILINOGEN 0.2 01/13/2021 0803   UROBILINOGEN 1.0 01/08/2013 0912   NITRITE POSITIVE (A) 01/12/2023 1007   LEUKOCYTESUR LARGE (A) 01/12/2023 1007   Sepsis Labs Recent Labs  Lab 01/12/23 0951 01/12/23 1757 01/13/23 0424  WBC 6.0 5.1 3.9*   Microbiology Recent Results (from the past 240 hours)  Urine Culture     Status: Abnormal   Collection Time: 01/12/23 10:07 AM   Specimen: Urine, Clean Catch  Result Value Ref Range Status   Specimen Description   Final    URINE, CLEAN CATCH Performed at [facility], [address] [place], [place] [address]    Special Requests   Final    NONE Performed at [facility], [address], [place], [place] [address]    Culture MULTIPLE SPECIES PRESENT, SUGGEST RECOLLECTION (A)  Final   Report Status 01/13/2023 FINAL  Final     Patient was seen and examined on the day of discharge and was found to be in stable condition. Time coordinating discharge: 25 minutes including assessment and coordination of care, as well as examination of the patient.   SIGNED:  Noralee Stain, DO [facility] 01/14/2023,  11:57 AM

## 2023-01-17 ENCOUNTER — Other Ambulatory Visit: Payer: Self-pay | Admitting: Family Medicine

## 2023-01-17 NOTE — Telephone Encounter (Signed)
Copied from CRM [id number]. Topic: Clinical - Medication Refill >> Jan 17, 2023  3:00 PM Lennart Pall wrote: Most Recent Primary Care Visit:  Provider: Abbe Amsterdam R  Department: [facility]  Visit Type: OFFICE VISIT  Date: 08/27/2021  Medication: ***  Has the patient contacted their pharmacy?  (Agent: If no, request that the patient contact the pharmacy for the refill. If patient does not wish to contact the pharmacy document the reason why and proceed with request.) (Agent: If yes, when and what did the pharmacy advise?)  Is this the correct pharmacy for this prescription?  If no, delete pharmacy and type the correct one.  This is the patient's preferred pharmacy:  [id number] - [place], [place] - [address] AT [facility] [address] [place] [address] Phone: [phone] Fax: [phone]  [facility] - [place] - [address] [place] [address] Phone: [phone] Fax: [phone]  [id number] - [place], [place] - [address] AT [facility] [address] [place] [address] Phone: [phone] Fax: [phone]   Has the prescription been filled recently?   Is the patient out of the medication?   Has the patient been seen for an appointment in the last year OR does the patient have an upcoming appointment?   Can we respond through MyChart?   Agent: Please be advised that Rx refills may take up to 3 business days. We ask that you follow-up with your pharmacy.

## 2023-01-18 ENCOUNTER — Other Ambulatory Visit: Payer: Self-pay | Admitting: Family Medicine

## 2023-01-18 DIAGNOSIS — I10 Essential (primary) hypertension: Secondary | ICD-10-CM

## 2023-01-20 ENCOUNTER — Telehealth: Payer: Self-pay | Admitting: *Deleted

## 2023-01-20 NOTE — Telephone Encounter (Signed)
 Copied from CRM [id number]. Topic: Clinical - Prescription Issue >> Jan 20, 2023 12:39 PM Corean SAUNDERS wrote: Reason for CRM: Patient is requesting that her sertraline  (ZOLOFT ) 25 MG tablet be sent to the following pharmacy  [facility] - [place], [place] - [address] [place] [address] Phone: [phone] Fax: [phone]

## 2023-01-22 ENCOUNTER — Other Ambulatory Visit: Payer: Self-pay | Admitting: Family Medicine

## 2023-01-22 DIAGNOSIS — I10 Essential (primary) hypertension: Secondary | ICD-10-CM

## 2023-01-27 ENCOUNTER — Other Ambulatory Visit: Payer: Self-pay | Admitting: Orthopedic Surgery

## 2023-01-27 ENCOUNTER — Other Ambulatory Visit: Payer: Self-pay | Admitting: Internal Medicine

## 2023-01-27 DIAGNOSIS — K219 Gastro-esophageal reflux disease without esophagitis: Secondary | ICD-10-CM

## 2023-01-27 NOTE — Telephone Encounter (Signed)
 Please advise how many refills Sir. Seen 10/2021. Thank you.

## 2023-02-09 ENCOUNTER — Encounter: Payer: Self-pay | Admitting: Adult Health

## 2023-02-10 NOTE — Telephone Encounter (Signed)
This RN called pt per her My Chart message - with concern and picture of lump on right side of her neck.  Per phone discussion pt states area is not new- comes and goes but today more prominent with some pain in throat.  She denies any fevers or respiratory symptoms.  Pt has remote hx of left left cancer of the tongue s/p radiation to the left side of her neck.  This RN verified location as " below the jaw line closer to the ear"  This RN discussed above relating to salivary gland blocked- with instructions presently to apply warm compresses, drink warm liquids and aid of "sour" candy that would help to open up the gland.  She will call this office with an update and if not responding will proceed with other recommendations.

## 2023-02-11 ENCOUNTER — Telehealth: Payer: Self-pay | Admitting: *Deleted

## 2023-02-11 ENCOUNTER — Encounter: Payer: Self-pay | Admitting: Adult Health

## 2023-02-15 ENCOUNTER — Encounter: Payer: Self-pay | Admitting: Adult Health

## 2023-02-15 ENCOUNTER — Inpatient Hospital Stay: Payer: Medicare Other | Attending: Adult Health | Admitting: Adult Health

## 2023-02-15 VITALS — BP 133/80 | HR 90 | Temp 98.0°F | Resp 16 | Wt 186.2 lb

## 2023-02-15 DIAGNOSIS — Z833 Family history of diabetes mellitus: Secondary | ICD-10-CM | POA: Insufficient documentation

## 2023-02-15 DIAGNOSIS — Z79899 Other long term (current) drug therapy: Secondary | ICD-10-CM | POA: Diagnosis not present

## 2023-02-15 DIAGNOSIS — Z5986 Financial insecurity: Secondary | ICD-10-CM | POA: Insufficient documentation

## 2023-02-15 DIAGNOSIS — Z9049 Acquired absence of other specified parts of digestive tract: Secondary | ICD-10-CM | POA: Diagnosis not present

## 2023-02-15 DIAGNOSIS — C01 Malignant neoplasm of base of tongue: Secondary | ICD-10-CM | POA: Insufficient documentation

## 2023-02-15 DIAGNOSIS — C0680 Malignant neoplasm of overlapping sites of unspecified parts of mouth: Secondary | ICD-10-CM | POA: Diagnosis not present

## 2023-02-15 DIAGNOSIS — Z9089 Acquired absence of other organs: Secondary | ICD-10-CM | POA: Insufficient documentation

## 2023-02-15 DIAGNOSIS — Z8249 Family history of ischemic heart disease and other diseases of the circulatory system: Secondary | ICD-10-CM | POA: Diagnosis not present

## 2023-02-15 DIAGNOSIS — M419 Scoliosis, unspecified: Secondary | ICD-10-CM | POA: Insufficient documentation

## 2023-02-15 DIAGNOSIS — M1812 Unilateral primary osteoarthritis of first carpometacarpal joint, left hand: Secondary | ICD-10-CM | POA: Insufficient documentation

## 2023-02-15 DIAGNOSIS — Z87440 Personal history of urinary (tract) infections: Secondary | ICD-10-CM | POA: Diagnosis not present

## 2023-02-15 DIAGNOSIS — I10 Essential (primary) hypertension: Secondary | ICD-10-CM | POA: Insufficient documentation

## 2023-02-15 DIAGNOSIS — Z87442 Personal history of urinary calculi: Secondary | ICD-10-CM | POA: Insufficient documentation

## 2023-02-15 DIAGNOSIS — Z8261 Family history of arthritis: Secondary | ICD-10-CM | POA: Diagnosis not present

## 2023-02-15 MED ORDER — AMOXICILLIN 875 MG PO TABS: 875.0000 mg | ORAL_TABLET | Freq: Two times a day (BID) | ORAL | 0 refills | Status: DC

## 2023-02-15 MED ORDER — FLUCONAZOLE 150 MG PO TABS: 150.0000 mg | ORAL_TABLET | Freq: Once | ORAL | 0 refills | Status: AC

## 2023-02-15 NOTE — Progress Notes (Signed)
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 Hezzie Bump performed left hemiglossectomy, resection of base of tongue cancer, pharyngoplasty, left selective neck dissection, levels I and II, tracheotomy, and G tube placement. She has questionable margins that are technically negative.   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 14 lymph nodes from the left level II neck, 2 nodes from the left submandibular region, and 4 nodes from the submental region were negative for malignancy. Carcinoma was present focally at the lateral edge of the left posterior tongue, 4.6 cm in greatest extent. The lateral margin was sent separately as specimen B, which was negative for carcinoma. Perineural invasion was appreciated but lymphovascular invasion was not identified.   11/16/2016 - 01/01/2017 Radiation Therapy   Site/dose:   Base of tongue tumor bed/ 66 Gy in 33 fractions      CURRENT THERAPY: Observation  INTERVAL  HISTORY: Kristen [name] 58 y.o. female returns for follow-up of unknot that she has been feeling over the past week in her throat.  She saw ENT however did not have these swollen lymph node at that appointment..  She has grandchildren who have been sick recently though she does not recall with what they have been sick.  The lymph node is slightly swollen and tender.  Her weight is up to 186 pounds today.  She does her meds through her feeding tube and supplements her diet with Jevity twice a day.  Patient Active Problem List   Diagnosis Date Noted   Enteritis 01/12/2023   Generalized weakness 07/18/2022   Dehydration 07/18/2022   Tension headache 07/18/2022   Depression 07/18/2022   History of tongue cancer 07/18/2022   PEG tube malfunction (HCC) 02/28/2022   Protein-calorie malnutrition, severe 04/29/2021   Pseudoarthrosis of lumbar spine 04/28/2021   Fusion of spine of lumbar region 04/28/2021   Pseudarthrosis after fusion or arthrodesis 04/20/2021    Class: Chronic   Tenosynovitis, de Quervain 12/14/2020   Arthritis of carpometacarpal (CMC) joint of left thumb 12/14/2020   Aortic atherosclerosis (HCC) 08/20/2020   Hyperkalemia 07/24/2020   Alcohol use 08/05/2019   Bilateral lower extremity edema 08/05/2019   Peripheral edema 03/12/2019   Iron deficiency anemia 12/11/2018   Loosening of hardware in spine [facility])    Other spondylosis with radiculopathy, lumbar region    History of lumbar spinal fusion 11/06/2018   UTI (urinary tract infection) 05/15/2017   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   Malignant neoplasm of base of tongue (HCC) 10/29/2016   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  HCC) 10/29/2016   Headache associated with sexual activity 05/14/2016   Tongue lesion 05/14/2016   Thrombocytopenia (HCC) 01/22/2016   Chronic diarrhea 01/22/2016   Chest pain    Essential hypertension    Spinal stenosis of lumbar region 01/20/2016    Class: Chronic    DDD (degenerative disc disease), lumbar 01/20/2016    Class: Chronic   Spinal stenosis of lumbar region with neurogenic claudication 01/20/2016   Low back pain 12/22/2015   Hypersomnolence 10/13/2015   Muscle cramp 08/01/2015   GERD (gastroesophageal reflux disease) 07/09/2015   Anemia of chronic disease 01/08/2013    is allergic to sulfa antibiotics and tomato.  MEDICAL HISTORY: Past Medical History:  Diagnosis Date   Allergy    Anemia    Iron deficiency   Anxiety    Arthritis    back, left shoulder   Blood transfusion without reported diagnosis    Chicken pox    Depression    Diarrhea    takes Imodium daily   GERD (gastroesophageal reflux disease)    H/O hiatal hernia    Headache(784.0)    History of kidney stones    1996ish   History of radiation therapy 11/16/16- 01/01/17   Base of Tongue/ 66 gy in 33 fractions/ Dose: 2 Gy   Hypertension    Iron deficiency anemia 12/11/2018   Migraine    none for 5 years (as of 01/13/16)   OSA (obstructive sleep apnea) 10/13/2015   unable to get cpap, plans to get one in 2018   Pneumonia    Restless legs    Scoliosis    Shingles 08/28/2013   Shortness of breath    with exertion   Sleep apnea    Tongue cancer (HCC)    tongue cancer    SURGICAL HISTORY: Past Surgical History:  Procedure Laterality Date   BACK SURGERY  07/21/1978   CARPOMETACARPEL SUSPENSION PLASTY Left 01/21/2021   Procedure: Left Thumb Ligament Carpometacarpal Arthroplasty;  Surgeon: Marlyne Beards, MD;  Location: [facility] OR;  Service: Orthopedics;  Laterality: Left;   CHOLECYSTECTOMY N/A 08/15/2013   Procedure: LAPAROSCOPIC CHOLECYSTECTOMY WITH INTRAOPERATIVE CHOLANGIOGRAM;  Surgeon: Cherylynn Ridges, MD;  Location: [facility] OR;  Service: General;  Laterality: N/A;   COLONOSCOPY N/A 01/09/2013   Procedure: COLONOSCOPY;  Surgeon: Theda Belfast, MD;  Location: [facility] ENDOSCOPY;  Service: Endoscopy;  Laterality: N/A;   COLONOSCOPY  03/2018   DIRECT LARYNGOSCOPY  07/2016    Dr. Hezzie Bump [facility]   ESOPHAGOGASTRODUODENOSCOPY  03/2018   ESOPHAGOGASTRODUODENOSCOPY (EGD) WITH PROPOFOL N/A 04/24/2019   Procedure: ESOPHAGOGASTRODUODENOSCOPY (EGD) WITH PROPOFOL;  Surgeon: Iva Boop, MD;  Location: [facility] ENDOSCOPY;  Service: Endoscopy;  Laterality: N/A;   GASTROSTOMY TUBE PLACEMENT  09/27/2016   GIVENS CAPSULE STUDY N/A 04/24/2019   Procedure: GIVENS CAPSULE STUDY;  Surgeon: Iva Boop, MD;  Location: [facility] ENDOSCOPY;  Service: Endoscopy;  Laterality: N/A;   HERNIA REPAIR Left 1981   IR PATIENT EVAL TECH 0-60 MINS  12/13/2016   IR PATIENT EVAL TECH 0-60 MINS  04/11/2017   IR PATIENT EVAL TECH 0-60 MINS  03/24/2018   IR PATIENT EVAL TECH 0-60 MINS  11/23/2018   IR PATIENT EVAL TECH 0-60 MINS  05/28/2019   IR PATIENT EVAL TECH 0-60 MINS  12/17/2022   IR REPLACE G-TUBE SIMPLE WO FLUORO  12/12/2017   IR REPLACE G-TUBE SIMPLE WO FLUORO  03/29/2018   IR REPLACE G-TUBE SIMPLE WO FLUORO  06/29/2018   IR REPLACE G-TUBE SIMPLE WO FLUORO  11/02/2018   IR REPLACE G-TUBE SIMPLE WO FLUORO  03/08/2019   IR REPLACE G-TUBE SIMPLE WO FLUORO  07/02/2019   IR REPLACE G-TUBE SIMPLE WO FLUORO  11/30/2019   IR REPLACE G-TUBE SIMPLE WO FLUORO  12/27/2019   IR REPLACE G-TUBE SIMPLE WO FLUORO  05/27/2020   IR REPLACE G-TUBE SIMPLE WO FLUORO  07/09/2020   IR REPLACE G-TUBE SIMPLE WO FLUORO  10/23/2021   IR REPLACE G-TUBE SIMPLE WO FLUORO  08/19/2022   IR REPLC GASTRO/COLONIC TUBE PERCUT W/FLUORO  03/01/2022   MODIFIED RADICAL NECK DISSECTION Left 09/27/2016   Levels 1 & 2   PARTIAL GLOSSECTOMY Left 09/27/2016   Left hemi partial glossectomy   SPINE SURGERY  01/20/2016   fusion   TONSILLECTOMY     tracheotomy  09/27/2016   TUBAL LIGATION  06/1988    SOCIAL HISTORY: Social History   Socioeconomic History   Marital status: Widowed    Spouse name: Not on file   Number of children: 3   Years of education: 87   Highest education level: 12th grade  Occupational History   Occupation: Administrator, arts   Tobacco Use   Smoking status: Never   Smokeless tobacco: Never  Vaping Use   Vaping status: Never Used  Substance and Sexual Activity   Alcohol use: Yes    Alcohol/week: 7.0 standard drinks of alcohol    Types: 7 Cans of beer per week    Comment: occasionally   Drug use: No   Sexual activity: Yes    Birth control/protection: Surgical  Other Topics Concern   Not on file  Social History Narrative   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[employer], 3 children   Never smoker no drug use occasional alcohol averaging about 7 drinks a week   fun: Play with her grandkids, read   Social Drivers of Health   Financial Resource Strain: Medium Risk (06/04/2021)   Overall Financial Resource Strain (CARDIA)    Difficulty of Paying Living Expenses: Somewhat hard  Food Insecurity: Food Insecurity Present (01/14/2023)   Hunger Vital Sign    Worried About Running Out of Food in the Last Year: Often true    Ran Out of Food in the Last Year: Often true  Transportation Needs: No Transportation Needs (01/14/2023)   PRAPARE - Administrator, Civil Service (Medical): No    Lack of Transportation (Non-Medical): No  Physical Activity: Insufficiently Active (06/04/2021)   Exercise Vital Sign    Days of Exercise per Week: 3 days    Minutes of Exercise per Session: 30 min  Stress: No Stress Concern Present (06/04/2021)   [employer] of Occupational Health - Occupational Stress Questionnaire    Feeling of Stress : Only a little  Recent Concern: Stress - Stress Concern Present (03/31/2021)   [employer] of Occupational Health - Occupational Stress Questionnaire    Feeling of Stress : To some extent  Social Connections: Moderately Integrated (06/04/2021)   Social Connection and Isolation Panel [NHANES]    Frequency of Communication with Friends and Family: More than three times a week    Frequency of Social Gatherings with Friends and Family: More than three times a week     Attends Religious Services: More than 4 times per year    Active Member of [employer] or Organizations: Yes    Attends Banker Meetings: More than 4 times per year    Marital Status: Widowed  Recent Concern:  Social Connections - Moderately Isolated (03/31/2021)   Social Connection and Isolation Panel [NHANES]    Frequency of Communication with Friends and Family: More than three times a week    Frequency of Social Gatherings with Friends and Family: More than three times a week    Attends Religious Services: More than 4 times per year    Active Member of [employer] or Organizations: No    Attends Banker Meetings: Not on file    Marital Status: Widowed  Intimate Partner Violence: Not At Risk (01/14/2023)   Humiliation, Afraid, Rape, and Kick questionnaire    Fear of Current or Ex-Partner: No    Emotionally Abused: No    Physically Abused: No    Sexually Abused: No    FAMILY HISTORY: Family History  Problem Relation Age of Onset   Heart disease Father    Heart attack Father    Hypertension Mother    Arthritis Mother    Diabetes Maternal Grandmother    Diabetes Paternal Grandmother    Diabetes Maternal Uncle    Colon polyps Neg Hx    Colon cancer Neg Hx    Esophageal cancer Neg Hx    Stomach cancer Neg Hx    Rectal cancer Neg Hx     Review of Systems  Constitutional:  Negative for appetite change, chills, fatigue, fever and unexpected weight change.  HENT:   Negative for hearing loss, lump/mass and trouble swallowing.   Eyes:  Negative for eye problems and icterus.  Respiratory:  Negative for chest tightness, cough and shortness of breath.   Cardiovascular:  Negative for chest pain, leg swelling and palpitations.  Gastrointestinal:  Negative for abdominal distention, abdominal pain, constipation, diarrhea, nausea and vomiting.  Endocrine: Negative for hot flashes.  Genitourinary:  Negative for difficulty urinating.   Musculoskeletal:  Negative for  arthralgias.  Skin:  Negative for itching and rash.  Neurological:  Negative for dizziness, extremity weakness, headaches and numbness.  Hematological:  Negative for adenopathy. Does not bruise/bleed easily.  Psychiatric/Behavioral:  Negative for depression. The patient is not nervous/anxious.       PHYSICAL EXAMINATION    Vitals:   02/15/23 1108  BP: 133/80  Pulse: 90  Resp: 16  Temp: 98 F (36.7 C)  SpO2: 97%    Physical Exam Constitutional:      General: She is not in acute distress.    Appearance: Normal appearance. She is not toxic-appearing.  HENT:     Head: Normocephalic and atraumatic.     Mouth/Throat:     Mouth: Mucous membranes are moist.     Pharynx: Oropharyngeal exudate (Thrush noted on tongue and buccal mucosa) present. No posterior oropharyngeal erythema.  Eyes:     General: No scleral icterus. Cardiovascular:     Rate and Rhythm: Normal rate and regular rhythm.     Pulses: Normal pulses.     Heart sounds: Normal heart sounds.  Pulmonary:     Effort: Pulmonary effort is normal.     Breath sounds: Normal breath sounds.  Abdominal:     General: Abdomen is flat. Bowel sounds are normal. There is no distension.     Palpations: Abdomen is soft.     Tenderness: There is no abdominal tenderness.  Musculoskeletal:        General: No swelling.     Cervical back: Neck supple. Tenderness present.  Lymphadenopathy:     Cervical: Cervical adenopathy present.  Skin:    General: Skin is warm and  dry.     Findings: No rash.  Neurological:     General: No focal deficit present.     Mental Status: She is alert.  Psychiatric:        Mood and Affect: Mood normal.        Behavior: Behavior normal.        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    Lymphadenitis Kristen [name]'s history  and physical exam is consistent with lymphadenitis.   -amoxicillin 875 mg p.o. twice daily x1 week.   -Call her office if symptoms do not improve after a few days or if they worsen  Oral thrush.  Was crushing Mycelex troches and putting them through her tube.   -Discontinue Mycelex troches from med list -Prescribed Diflucan 150 mg p.o. once to repeat after 7 days.  RTC in 10/2023 for long term f/u or sooner if needed.  All questions were answered. The patient knows to call the clinic with any problems, questions or concerns. We can certainly see the patient much sooner if necessary.  Total encounter time:20 minutes*in face-to-face visit time, chart review, lab review, care coordination, order entry, and documentation of the encounter time.    Lillard Anes, NP 02/15/23 2:33 PM Medical Oncology and Hematology [facility] [address] [place], [place] [address] Tel. [phone]    Fax. [phone]  *Total Encounter Time as defined by the Centers for Medicare and Medicaid Services includes, in addition to the face-to-face time of a patient visit (documented in the note above) non-face-to-face time: obtaining and reviewing outside history, ordering and reviewing medications, tests or procedures, care coordination (communications with other health care professionals or caregivers) and documentation in the medical record.

## 2023-02-15 NOTE — Assessment & Plan Note (Signed)
Kristen [name] is a 58 year old woman with h/o head and neck cancer s/p surgery and radiation here today for long term follow up of her cancer.    Lymphadenitis Kristen [name]'s history and physical exam is consistent with lymphadenitis.   -amoxicillin 875 mg p.o. twice daily x1 week.   -Call her office if symptoms do not improve after a few days or if they worsen  Oral thrush.  Was crushing Mycelex troches and putting them through her tube.   -Discontinue Mycelex troches from med list -Prescribed Diflucan 150 mg p.o. once to repeat after 7 days.  RTC in 10/2023 for long term f/u or sooner if needed.

## 2023-02-16 NOTE — Telephone Encounter (Signed)
No entry

## 2023-03-08 ENCOUNTER — Other Ambulatory Visit (INDEPENDENT_AMBULATORY_CARE_PROVIDER_SITE_OTHER): Payer: Medicare Other

## 2023-03-08 ENCOUNTER — Ambulatory Visit (INDEPENDENT_AMBULATORY_CARE_PROVIDER_SITE_OTHER): Payer: Medicare Other | Admitting: Orthopedic Surgery

## 2023-03-08 DIAGNOSIS — M545 Low back pain, unspecified: Secondary | ICD-10-CM

## 2023-03-08 DIAGNOSIS — G8929 Other chronic pain: Secondary | ICD-10-CM | POA: Diagnosis not present

## 2023-03-08 MED ORDER — DICLOFENAC SODIUM 75 MG PO TBEC: 75.0000 mg | DELAYED_RELEASE_TABLET | Freq: Two times a day (BID) | ORAL | 0 refills | Status: DC

## 2023-03-08 MED ORDER — METHYLPREDNISOLONE 4 MG PO TBPK: ORAL_TABLET | ORAL | 0 refills | Status: DC

## 2023-03-08 NOTE — Progress Notes (Signed)
Orthopedic Spine Surgery Office Note   Assessment: Patient is a 58 y.o. female with acute worsening of chronic low back pain and radiation into the right posterior thigh and leg     Plan: -Patient has tried Naproxen, Tylenol, activity modification  -Prescribed a medrol dose pak and diclofenac. Told her she should take the diclofenac after the prednisone and to take it with food/fluids -Activity as tolerated, no spine specific precautions -Patient already has a follow up appointment with me, so I will plan to see her at that visit. X-rays at next visit: none     Patient expressed understanding of the plan and all questions were answered to the patient's satisfaction.    ___________________________________________________________________________     History: Patient is a 58 y.o. female who presents today for follow-up on her lumbar spine.  Patient has a history of chronic low back pain which has previously been controlled with naproxen. Within the last two weeks, she has noticed acute worsening of her low back pain with radiation into the posterior aspect of her right thigh and leg. There was no trauma or injury that preceded the onset of pain. No left leg pain. No bowel or bladder incontinence. No saddle anesthesia. Ambulating without assistive devices.    Treatments tried: Naproxen, Tylenol, activity modification     Physical Exam:   General: no acute distress, appears stated age Neurologic: alert, answering questions appropriately, following commands Respiratory: unlabored breathing on room air, symmetric chest rise Psychiatric: appropriate affect, normal cadence to speech     MSK (spine):   -Strength exam                                                   Left                  Right EHL                              5/5                  5/5 TA                                 5/5                  5/5 GSC                             5/5                  5/5 Knee extension             5/5                  5/5 Hip flexion                    5/5                  5/5   -Sensory exam                           Sensation intact to light touch in L3-S1 nerve distributions  of bilateral lower extremities     Imaging: XRs of the lumbar spine from 03/08/2023 were independently reviewed and interpreted, showing scoliosis with apex to the left at T12.  Chronically broken or cut rod seen at the lower thoracic spine on the right side.  Posterior instrumentation from L1-S1.  No lucency seen around the posterior fixation.  Interbody devices seen at L3/4, L4/5, and L5/S1.  No lucency seen around the interbody devices.  Devices appear in appropriate position.  No fracture or dislocation seen.  No evidence of instability on flexion/extension views.     Patient name: Kristen [name] Patient MRN: [id number] Date of visit: 03/08/23

## 2023-03-10 ENCOUNTER — Other Ambulatory Visit: Payer: Self-pay | Admitting: Family Medicine

## 2023-03-10 DIAGNOSIS — F411 Generalized anxiety disorder: Secondary | ICD-10-CM

## 2023-03-10 DIAGNOSIS — F321 Major depressive disorder, single episode, moderate: Secondary | ICD-10-CM

## 2023-03-11 ENCOUNTER — Telehealth: Payer: Self-pay

## 2023-03-11 NOTE — Telephone Encounter (Signed)
Called patient and let her know that her PCP would have to refill the medication

## 2023-03-14 ENCOUNTER — Other Ambulatory Visit: Payer: Self-pay | Admitting: Internal Medicine

## 2023-03-15 ENCOUNTER — Telehealth: Payer: Self-pay

## 2023-03-15 MED ORDER — CHOLECALCIFEROL 10 MCG (400 UNIT) PO TABS: 400.0000 [IU] | ORAL_TABLET | Freq: Every day | ORAL | 11 refills | Status: DC

## 2023-03-15 NOTE — Telephone Encounter (Signed)
 Incoming fax requesting refill on Kristen [name]'s Vitamin D3 400IU. Refilled as requested to [facility].

## 2023-03-31 ENCOUNTER — Telehealth: Payer: Self-pay | Admitting: Internal Medicine

## 2023-03-31 DIAGNOSIS — K219 Gastro-esophageal reflux disease without esophagitis: Secondary | ICD-10-CM

## 2023-03-31 MED ORDER — CHOLECALCIFEROL 10 MCG (400 UNIT) PO TABS: 400.0000 [IU] | ORAL_TABLET | Freq: Every day | ORAL | 11 refills | Status: AC

## 2023-03-31 MED ORDER — DEXLANSOPRAZOLE 60 MG PO CPDR: DELAYED_RELEASE_CAPSULE | ORAL | 3 refills | Status: DC

## 2023-03-31 NOTE — Telephone Encounter (Signed)
 Dexilant and Vit D3 refilled to [facility] as pharmacy requested.

## 2023-03-31 NOTE — Telephone Encounter (Signed)
 [facility] called to have a refill sent to them for dexilant and vitamin D3. Please advise.

## 2023-04-06 ENCOUNTER — Encounter: Payer: Self-pay | Admitting: Dermatology

## 2023-04-06 ENCOUNTER — Ambulatory Visit: Payer: Medicare Other | Admitting: Dermatology

## 2023-04-06 VITALS — BP 125/87

## 2023-04-06 DIAGNOSIS — D225 Melanocytic nevi of trunk: Secondary | ICD-10-CM

## 2023-04-06 DIAGNOSIS — D2271 Melanocytic nevi of right lower limb, including hip: Secondary | ICD-10-CM

## 2023-04-06 DIAGNOSIS — D492 Neoplasm of unspecified behavior of bone, soft tissue, and skin: Secondary | ICD-10-CM | POA: Diagnosis not present

## 2023-04-06 DIAGNOSIS — D485 Neoplasm of uncertain behavior of skin: Secondary | ICD-10-CM

## 2023-04-06 DIAGNOSIS — D229 Melanocytic nevi, unspecified: Secondary | ICD-10-CM

## 2023-04-06 NOTE — Progress Notes (Signed)
   New Patient Visit   Subjective  Kristen [name] is a 58 y.o. female who presents for the following: She saw Dr Margo Aye last year. She has a mole on her right foot and on her back that she states she was told need to be biopsied.    The following portions of the chart were reviewed this encounter and updated as appropriate: medications, allergies, medical history  Review of Systems:  No other skin or systemic complaints except as noted in HPI or Assessment and Plan.  Objective  Well appearing patient in no apparent distress; mood and affect are within normal limits.   A focused examination was performed of the following areas:  Relevant exam findings are noted in the Assessment and Plan.  Mid Back 5 mm brown papule   Assessment & Plan   1. Suspicious Nevi - Assessment: Patient presented with two moles of concern, one on the foot and one on the back. The mole on the back was described as a 5 millimeter brown irregular papule, which appeared larger and darker in the middle compared to the foot mole. Given the patient's history of cancer requiring radiation therapy, there is an increased concern for potential skin malignancy. - Plan:    Perform shave biopsy of the 5mm brown irregular papule on the back    Send biopsy specimen to lab for analysis    Communicate results to patient via MyChart if benign, or phone call if abnormal    Regular biopsy aftercare instructions provided  2. Benign Melanocytic Nevus - Assessment: The mole on the patient's foot was examined and determined to be clinically benign. No concerning features were noted, and given the location, intervention was deemed unnecessary to avoid potential infection risk. - Plan:    No intervention required    Continue monitoring for any changes  NEOPLASM OF UNCERTAIN BEHAVIOR OF SKIN Mid Back Epidermal / dermal shaving  Lesion diameter (cm):  0.5 Informed consent: discussed and consent obtained   Timeout: patient  name, date of birth, surgical site, and procedure verified   Procedure prep:  Patient was prepped and draped in usual sterile fashion Prep type:  Isopropyl alcohol Anesthesia: the lesion was anesthetized in a standard fashion   Anesthetic:  1% lidocaine w/ epinephrine 1-100,000 buffered w/ 8.4% NaHCO3 Instrument used: flexible razor blade   Hemostasis achieved with: pressure, aluminum chloride and electrodesiccation   Outcome: patient tolerated procedure well   Post-procedure details: sterile dressing applied and wound care instructions given   Dressing type: bandage and petrolatum   Specimen 1 - Surgical pathology Differential Diagnosis: R/O DN  Check Margins: No  Return if symptoms worsen or fail to improve.  I, Joanie Coddington, CMA, am acting as scribe for [employer], DO .   Documentation: I have reviewed the above documentation for accuracy and completeness, and I agree with the above.  Langston Reusing, DO

## 2023-04-06 NOTE — Patient Instructions (Signed)

## 2023-04-07 ENCOUNTER — Encounter (HOSPITAL_COMMUNITY): Payer: Self-pay

## 2023-04-07 ENCOUNTER — Other Ambulatory Visit: Payer: Self-pay

## 2023-04-07 ENCOUNTER — Emergency Department (HOSPITAL_COMMUNITY)
Admission: EM | Admit: 2023-04-07 | Discharge: 2023-04-07 | Disposition: A | Discharge status: Home / Self Care | Attending: Emergency Medicine | Admitting: Emergency Medicine

## 2023-04-07 DIAGNOSIS — Z7982 Long term (current) use of aspirin: Secondary | ICD-10-CM | POA: Insufficient documentation

## 2023-04-07 DIAGNOSIS — I10 Essential (primary) hypertension: Secondary | ICD-10-CM | POA: Insufficient documentation

## 2023-04-07 DIAGNOSIS — K9423 Gastrostomy malfunction: Secondary | ICD-10-CM | POA: Diagnosis present

## 2023-04-07 DIAGNOSIS — Z85810 Personal history of malignant neoplasm of tongue: Secondary | ICD-10-CM | POA: Insufficient documentation

## 2023-04-07 DIAGNOSIS — Z79899 Other long term (current) drug therapy: Secondary | ICD-10-CM | POA: Insufficient documentation

## 2023-04-07 LAB — SURGICAL PATHOLOGY

## 2023-04-07 NOTE — ED Provider Notes (Signed)
 [place] EMERGENCY DEPARTMENT AT [facility] Provider Note  CSN: [id number] Arrival date & time: 04/07/23 0920  Chief Complaint(s) Tube Replacement (PEG tube)  HPI Kristen [name] is a 58 y.o. female history of tongue cancer, partially G-tube dependent for medications presenting to the emergency department with G-tube issue.  The patient reports that she developed a crack in her G-tube has been leaking.  She reports she is not able to get her medications since she has trouble swallowing.  Denies any abdominal pain, fevers or chills, other complaints.  This occurred today.   Past Medical History Past Medical History:  Diagnosis Date   Allergy    Anemia    Iron deficiency   Anxiety    Arthritis    back, left shoulder   Blood transfusion without reported diagnosis    Chicken pox    Depression    Diarrhea    takes Imodium daily   GERD (gastroesophageal reflux disease)    H/O hiatal hernia    Headache(784.0)    History of kidney stones    1996ish   History of radiation therapy 11/16/16- 01/01/17   Base of Tongue/ 66 gy in 33 fractions/ Dose: 2 Gy   Hypertension    Iron deficiency anemia 12/11/2018   Migraine    none for 5 years (as of 01/13/16)   OSA (obstructive sleep apnea) 10/13/2015   unable to get cpap, plans to get one in 2018   Pneumonia    Restless legs    Scoliosis    Shingles 08/28/2013   Shortness of breath    with exertion   Sleep apnea    Tongue cancer (HCC)    tongue cancer   Patient Active Problem List   Diagnosis Date Noted   Enteritis 01/12/2023   Generalized weakness 07/18/2022   Dehydration 07/18/2022   Tension headache 07/18/2022   Depression 07/18/2022   History of tongue cancer 07/18/2022   PEG tube malfunction (HCC) 02/28/2022   Protein-calorie malnutrition, severe 04/29/2021   Pseudoarthrosis of lumbar spine 04/28/2021   Fusion of spine of lumbar region 04/28/2021   Pseudarthrosis after fusion or arthrodesis 04/20/2021     Class: Chronic   Tenosynovitis, de Quervain 12/14/2020   Arthritis of carpometacarpal (CMC) joint of left thumb 12/14/2020   Aortic atherosclerosis (HCC) 08/20/2020   Hyperkalemia 07/24/2020   Alcohol use 08/05/2019   Bilateral lower extremity edema 08/05/2019   Peripheral edema 03/12/2019   Iron deficiency anemia 12/11/2018   Loosening of hardware in spine (HCC)    Other spondylosis with radiculopathy, lumbar region    History of lumbar spinal fusion 11/06/2018   UTI (urinary tract infection) 05/15/2017   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   Malignant neoplasm of base of tongue (HCC) 10/29/2016   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   Headache associated with sexual activity 05/14/2016   Tongue lesion 05/14/2016   Thrombocytopenia (HCC) 01/22/2016   Chronic diarrhea 01/22/2016   Chest pain    Essential hypertension    Spinal stenosis of lumbar region 01/20/2016    Class: Chronic   DDD (degenerative disc disease), lumbar 01/20/2016    Class: Chronic   Spinal stenosis of lumbar region with neurogenic claudication 01/20/2016   Low back pain 12/22/2015   Hypersomnolence 10/13/2015   Muscle cramp 08/01/2015   GERD (gastroesophageal reflux disease) 07/09/2015   Anemia of chronic disease 01/08/2013   Home Medication(s) Prior to Admission medications   Medication Sig  Start Date End Date Taking? Authorizing Provider  acetaminophen (TYLENOL) 500 MG tablet Take 2 tablets (1,000 mg total) by mouth every 8 (eight) hours as needed for moderate pain or mild pain. Patient taking differently: Place 1,000 mg into feeding tube every 8 (eight) hours as needed for moderate pain (pain score 4-6) or mild pain (pain score 1-3). 04/21/22   London Sheer, MD  AMBULATORY NON FORMULARY MEDICATION Jevity 1.5 Sig: 6 cans per day, Bolus feeding  60ml syringes #30  Tape and gauze 03/12/22   Iva Boop, MD  amLODipine (NORVASC) 10 MG tablet TAKE 1 TABLET BY  MOUTH EVERY DAY Patient taking differently: Place 10 mg into feeding tube daily. 12/14/22   Deeann Saint, MD  amoxicillin (AMOXIL) 875 MG tablet Take 1 tablet (875 mg total) by mouth 2 (two) times daily. 02/15/23   Loa Socks, NP  aspirin 81 MG chewable tablet Place 81 mg into feeding tube daily.  10/05/16   [provider]  baclofen (LIORESAL) 10 MG tablet Place 1 tablet (10 mg total) into feeding tube 3 (three) times daily. 10 mg, per tube, twice a day and an additional 10 mg once a day as needed for spasms 01/27/23 04/27/23  London Sheer, MD  Calcium Citrate 200 MG TABS Place 200 mg into feeding tube daily at 12 noon.    [provider]  cholecalciferol (VITAMIN D3) 10 MCG (400 UNIT) TABS tablet Place 1 tablet (400 Units total) into feeding tube daily. 03/31/23   Iva Boop, MD  dexlansoprazole [name]) 60 MG capsule ADMINISTER 1 CAPSULE BY MOUTH VIA FEEDING TUBE DAILY 03/31/23   Iva Boop, MD  diclofenac (VOLTAREN) 75 MG EC tablet Take 1 tablet (75 mg total) by mouth 2 (two) times daily with a meal. 03/08/23 04/07/23  London Sheer, MD  diphenoxylate-atropine (LOMOTIL) 2.5-0.025 MG tablet 2 tablets via tube twice a day Patient taking differently: Place 2 tablets into feeding tube 2 (two) times daily. 11/26/22   Iva Boop, MD  folic acid (FOLVITE) 400 MCG tablet Place 400 mcg into feeding tube daily.    [provider]  guaiFENesin (COUGH SYRUP PO) Place 32 mLs into feeding tube in the morning and at bedtime.    [provider]  IRON SUPPLEMENT 220 (44 Fe) MG/5ML SOLN TAKE 6.8 MLS VIA FEEDING TUBE EVERY DAY 03/14/23   Iva Boop, MD  lidocaine (LMX) 4 % cream Apply 1 application  topically as needed (to affected sites- for pain).    [provider]  methylPREDNISolone (MEDROL DOSEPAK) 4 MG TBPK tablet Take as prescribed on the box 03/08/23   London Sheer, MD  ondansetron (ZOFRAN-ODT) 4 MG disintegrating tablet  Take 1 tablet (4 mg total) by mouth every 8 (eight) hours as needed for nausea or vomiting. Patient not taking: Reported on 01/12/2023 08/24/21   Renne Crigler, PA-C  potassium chloride SA (KLOR-CON) 20 MEQ tablet TAKE 1 TABLET BY MOUTH TWICE DAILY FOR THE NEXT 3 DAYS. OKAY TO CRUSH IF UNABLE TO SWALLOW. CAN ALSO TAKE PER TUBE IF NEEDED. Patient taking differently: 20 mEq See admin instructions. 20 mEq Per tube at bedtime 08/13/19   Deeann Saint, MD  sertraline (ZOLOFT) 25 MG tablet TAKE 1 TABLET BY MOUTH EVERY DAY Patient taking differently: Place 25 mg into feeding tube daily. 04/26/22   Deeann Saint, MD  Past Surgical History Past Surgical History:  Procedure Laterality Date   BACK SURGERY  07/21/1978   CARPOMETACARPEL SUSPENSION PLASTY Left 01/21/2021   Procedure: Left Thumb Ligament Carpometacarpal Arthroplasty;  Surgeon: Marlyne Beards, MD;  Location: [facility] OR;  Service: Orthopedics;  Laterality: Left;   CHOLECYSTECTOMY N/A 08/15/2013   Procedure: LAPAROSCOPIC CHOLECYSTECTOMY WITH INTRAOPERATIVE CHOLANGIOGRAM;  Surgeon: Cherylynn Ridges, MD;  Location: [facility] OR;  Service: General;  Laterality: N/A;   COLONOSCOPY N/A 01/09/2013   Procedure: COLONOSCOPY;  Surgeon: Theda Belfast, MD;  Location: [facility] ENDOSCOPY;  Service: Endoscopy;  Laterality: N/A;   COLONOSCOPY  03/2018   DIRECT LARYNGOSCOPY  07/2016   Dr. Hezzie Bump [facility]   ESOPHAGOGASTRODUODENOSCOPY  03/2018   ESOPHAGOGASTRODUODENOSCOPY (EGD) WITH PROPOFOL N/A 04/24/2019   Procedure: ESOPHAGOGASTRODUODENOSCOPY (EGD) WITH PROPOFOL;  Surgeon: Iva Boop, MD;  Location: [facility] ENDOSCOPY;  Service: Endoscopy;  Laterality: N/A;   GASTROSTOMY TUBE PLACEMENT  09/27/2016   GIVENS CAPSULE STUDY N/A 04/24/2019   Procedure: GIVENS CAPSULE STUDY;  Surgeon: Iva Boop, MD;  Location: [facility] ENDOSCOPY;  Service: Endoscopy;   Laterality: N/A;   HERNIA REPAIR Left 1981   IR PATIENT EVAL TECH 0-60 MINS  12/13/2016   IR PATIENT EVAL TECH 0-60 MINS  04/11/2017   IR PATIENT EVAL TECH 0-60 MINS  03/24/2018   IR PATIENT EVAL TECH 0-60 MINS  11/23/2018   IR PATIENT EVAL TECH 0-60 MINS  05/28/2019   IR PATIENT EVAL TECH 0-60 MINS  12/17/2022   IR REPLACE G-TUBE SIMPLE WO FLUORO  12/12/2017   IR REPLACE G-TUBE SIMPLE WO FLUORO  03/29/2018   IR REPLACE G-TUBE SIMPLE WO FLUORO  06/29/2018   IR REPLACE G-TUBE SIMPLE WO FLUORO  11/02/2018   IR REPLACE G-TUBE SIMPLE WO FLUORO  03/08/2019   IR REPLACE G-TUBE SIMPLE WO FLUORO  07/02/2019   IR REPLACE G-TUBE SIMPLE WO FLUORO  11/30/2019   IR REPLACE G-TUBE SIMPLE WO FLUORO  12/27/2019   IR REPLACE G-TUBE SIMPLE WO FLUORO  05/27/2020   IR REPLACE G-TUBE SIMPLE WO FLUORO  07/09/2020   IR REPLACE G-TUBE SIMPLE WO FLUORO  10/23/2021   IR REPLACE G-TUBE SIMPLE WO FLUORO  08/19/2022   IR REPLC GASTRO/COLONIC TUBE PERCUT W/FLUORO  03/01/2022   MODIFIED RADICAL NECK DISSECTION Left 09/27/2016   Levels 1 & 2   PARTIAL GLOSSECTOMY Left 09/27/2016   Left hemi partial glossectomy   SPINE SURGERY  01/20/2016   fusion   TONSILLECTOMY     tracheotomy  09/27/2016   TUBAL LIGATION  06/1988   Family History Family History  Problem Relation Age of Onset   Heart disease Father    Heart attack Father    Hypertension Mother    Arthritis Mother    Diabetes Maternal Grandmother    Diabetes Paternal Grandmother    Diabetes Maternal Uncle    Colon polyps Neg Hx    Colon cancer Neg Hx    Esophageal cancer Neg Hx    Stomach cancer Neg Hx    Rectal cancer Neg Hx     Social History Social History   Tobacco Use   Smoking status: Never   Smokeless tobacco: Never  Vaping Use   Vaping status: Never Used  Substance Use Topics   Alcohol use: Yes    Alcohol/week: 7.0 standard drinks of alcohol    Types: 7 Cans of beer per week    Comment: occasionally   Drug use: No   Allergies Sulfa antibiotics  and Tomato  Review of Systems Review of Systems  All other systems reviewed and are negative.   Physical Exam Vital Signs  I have reviewed the triage vital signs BP (!) 138/94   Pulse (!) 105   Temp 98.3 F (36.8 C) (Oral)   Resp 20   Ht 5\' 7"  (1.702 m)   LMP 11/15/2013   SpO2 98%   BMI 29.16 kg/m  Physical Exam Vitals and nursing note reviewed.  Constitutional:      Appearance: Normal appearance.  HENT:     Head: Normocephalic and atraumatic.     Mouth/Throat:     Mouth: Mucous membranes are moist.  Eyes:     Conjunctiva/sclera: Conjunctivae normal.  Cardiovascular:     Rate and Rhythm: Normal rate.  Pulmonary:     Effort: Pulmonary effort is normal. No respiratory distress.  Abdominal:     General: Abdomen is flat.     Palpations: Abdomen is soft.     Comments: G-tube in place, appears appropriately positioned.  Tape around the midportion of the tube with some leaking around the tape  Musculoskeletal:        General: No deformity.  Skin:    General: Skin is warm and dry.     Capillary Refill: Capillary refill takes less than 2 seconds.  Neurological:     General: No focal deficit present.     Mental Status: She is alert. Mental status is at baseline.  Psychiatric:        Mood and Affect: Mood normal.        Behavior: Behavior normal.     ED Results and Treatments Labs (all labs ordered are listed, but only abnormal results are displayed) Labs Reviewed - No data to display                                                                                                                        Radiology No results found.  Pertinent labs & imaging results that were available during my care of the patient were reviewed by me and considered in my medical decision making (see MDM for details).  Medications Ordered in ED Medications - No data to display                                                                                                                                    Procedures .Gastrostomy tube replacement  Date/Time:  04/07/2023 10:31 AM  Performed by: Lonell Grandchild, MD Authorized by: Lonell Grandchild, MD  Consent: Verbal consent obtained. Risks and benefits: risks, benefits and alternatives were discussed Consent given by: patient Required items: required blood products, implants, devices, and special equipment available Patient identity confirmed: verbally with patient and arm band Local anesthesia used: no  Anesthesia: Local anesthesia used: no  Sedation: Patient sedated: no  Patient tolerance: patient tolerated the procedure well with no immediate complications Comments: Gastric contents easily withdrawn from G-tube and G-tube was easily flushed with sterile water     (including critical care time)  Medical Decision Making / ED Course   MDM:  58 year old presenting with G-tube issue.  Abdominal exam is benign.  She has a mechanical complication of her G-tube device.  Will replace this and reassess  Clinical Course as of 04/07/23 1032  Thu Apr 07, 2023  1031 PEG tube was replaced.  See procedure note for details.  Gastric contents easily withdrawn from G-tube.  She feels well.  Request discharge. Will discharge patient to home. All questions answered. Patient comfortable with plan of discharge. Return precautions discussed with patient and specified on the after visit summary.  [WS]    Clinical Course User Index [WS] Lonell Grandchild, MD      Medicines ordered and prescription drug management: No orders of the defined types were placed in this encounter.   -I have reviewed the patients home medicines and have made adjustments as needed    Reevaluation: After the interventions noted above, I reevaluated the patient and found that their symptoms have resolved  Co morbidities that complicate the patient evaluation  Past Medical History:  Diagnosis Date   Allergy    Anemia    Iron  deficiency   Anxiety    Arthritis    back, left shoulder   Blood transfusion without reported diagnosis    Chicken pox    Depression    Diarrhea    takes Imodium daily   GERD (gastroesophageal reflux disease)    H/O hiatal hernia    Headache(784.0)    History of kidney stones    1996ish   History of radiation therapy 11/16/16- 01/01/17   Base of Tongue/ 66 gy in 33 fractions/ Dose: 2 Gy   Hypertension    Iron deficiency anemia 12/11/2018   Migraine    none for 5 years (as of 01/13/16)   OSA (obstructive sleep apnea) 10/13/2015   unable to get cpap, plans to get one in 2018   Pneumonia    Restless legs    Scoliosis    Shingles 08/28/2013   Shortness of breath    with exertion   Sleep apnea    Tongue cancer (HCC)    tongue cancer      Dispostion: Disposition decision including need for hospitalization was considered, and patient discharged from emergency department.    Final Clinical Impression(s) / ED Diagnoses Final diagnoses:  Gastrostomy tube dysfunction (HCC)     This chart was dictated using voice recognition software.  Despite best efforts to proofread,  errors can occur which can change the documentation meaning.    Lonell Grandchild, MD 04/07/23 [phone]

## 2023-04-07 NOTE — ED Triage Notes (Signed)
 Patient presented to ER for PEG tube issue. Patient states her tube busted and she cannot use it. Patient has tape over portion of peg tube, tube also appears to be coming out a little bit, can see part of balloon.

## 2023-04-08 ENCOUNTER — Other Ambulatory Visit: Payer: Self-pay | Admitting: Internal Medicine

## 2023-04-08 ENCOUNTER — Other Ambulatory Visit: Payer: Self-pay | Admitting: Family Medicine

## 2023-04-08 DIAGNOSIS — F321 Major depressive disorder, single episode, moderate: Secondary | ICD-10-CM

## 2023-04-08 DIAGNOSIS — F411 Generalized anxiety disorder: Secondary | ICD-10-CM

## 2023-04-11 ENCOUNTER — Other Ambulatory Visit: Payer: Self-pay | Admitting: Orthopedic Surgery

## 2023-04-11 ENCOUNTER — Encounter: Payer: Self-pay | Admitting: Dermatology

## 2023-04-11 NOTE — Progress Notes (Signed)
 Hi Kristen [name]  Dr. Onalee Hua reviewed your biopsy results and they showed the spot removed was a little "abnormal" but not cancerous.  No additional treatment is required because the margins were clear.  We will continue to monitor the area for re-pigmentation during your annual skin exams. The detailed report is available to view in MyChart.  Have a great day!  Kind Regards,  Dr. Kermit Balo Care Team

## 2023-04-19 ENCOUNTER — Encounter (HOSPITAL_BASED_OUTPATIENT_CLINIC_OR_DEPARTMENT_OTHER): Payer: Self-pay | Admitting: Emergency Medicine

## 2023-04-19 ENCOUNTER — Emergency Department (HOSPITAL_BASED_OUTPATIENT_CLINIC_OR_DEPARTMENT_OTHER)

## 2023-04-19 ENCOUNTER — Emergency Department (HOSPITAL_BASED_OUTPATIENT_CLINIC_OR_DEPARTMENT_OTHER): Admitting: Radiology

## 2023-04-19 ENCOUNTER — Other Ambulatory Visit: Payer: Self-pay

## 2023-04-19 ENCOUNTER — Emergency Department (HOSPITAL_BASED_OUTPATIENT_CLINIC_OR_DEPARTMENT_OTHER)
Admission: EM | Admit: 2023-04-19 | Discharge: 2023-04-19 | Disposition: A | Discharge status: Home / Self Care | Source: Home / Self Care | Attending: Emergency Medicine | Admitting: Emergency Medicine

## 2023-04-19 DIAGNOSIS — Y92410 Unspecified street and highway as the place of occurrence of the external cause: Secondary | ICD-10-CM | POA: Insufficient documentation

## 2023-04-19 DIAGNOSIS — M546 Pain in thoracic spine: Secondary | ICD-10-CM | POA: Insufficient documentation

## 2023-04-19 DIAGNOSIS — I10 Essential (primary) hypertension: Secondary | ICD-10-CM | POA: Insufficient documentation

## 2023-04-19 DIAGNOSIS — Z85810 Personal history of malignant neoplasm of tongue: Secondary | ICD-10-CM | POA: Insufficient documentation

## 2023-04-19 DIAGNOSIS — A419 Sepsis, unspecified organism: Secondary | ICD-10-CM | POA: Diagnosis not present

## 2023-04-19 DIAGNOSIS — J9600 Acute respiratory failure, unspecified whether with hypoxia or hypercapnia: Secondary | ICD-10-CM | POA: Diagnosis not present

## 2023-04-19 MED ORDER — CYCLOBENZAPRINE HCL 10 MG PO TABS: 5.0000 mg | ORAL_TABLET | Freq: Every evening | ORAL | 0 refills | Status: DC | PRN

## 2023-04-19 MED ORDER — ACETAMINOPHEN 160 MG/5ML PO SOLN
650.0000 mg | Freq: Once | ORAL | Status: AC
  Administered 2023-04-19: 650 mg
  Filled 2023-04-19: qty 20.3
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
## 2023-04-19 NOTE — ED Triage Notes (Addendum)
 Place in ccollar due to neck pain  Nodding off during triage, easily aroused  Reports not taking any medication since this  AM
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
## 2023-04-19 NOTE — ED Notes (Signed)
 Discharge instructions, follow up care, and prescription reviewed and explained, pt verbalized understanding and had no further questions on d/c. Pt caox4, ambulatory, NAD on d/c.

## 2023-04-21 ENCOUNTER — Encounter: Payer: Self-pay | Admitting: Adult Health

## 2023-04-22 ENCOUNTER — Emergency Department (HOSPITAL_COMMUNITY)

## 2023-04-22 ENCOUNTER — Inpatient Hospital Stay (HOSPITAL_COMMUNITY)

## 2023-04-22 ENCOUNTER — Other Ambulatory Visit: Payer: Self-pay

## 2023-04-22 ENCOUNTER — Other Ambulatory Visit: Payer: Self-pay | Admitting: *Deleted

## 2023-04-22 ENCOUNTER — Inpatient Hospital Stay (HOSPITAL_COMMUNITY)
Admission: EM | Admit: 2023-04-22 | Discharge: 2023-04-28 | DRG: 871 | Disposition: A | Discharge status: Home Health | Attending: Family Medicine | Admitting: Family Medicine

## 2023-04-22 ENCOUNTER — Encounter (HOSPITAL_COMMUNITY): Payer: Self-pay

## 2023-04-22 DIAGNOSIS — J9601 Acute respiratory failure with hypoxia: Secondary | ICD-10-CM | POA: Diagnosis present

## 2023-04-22 DIAGNOSIS — D72819 Decreased white blood cell count, unspecified: Secondary | ICD-10-CM | POA: Diagnosis present

## 2023-04-22 DIAGNOSIS — R579 Shock, unspecified: Secondary | ICD-10-CM | POA: Diagnosis not present

## 2023-04-22 DIAGNOSIS — D509 Iron deficiency anemia, unspecified: Secondary | ICD-10-CM | POA: Diagnosis present

## 2023-04-22 DIAGNOSIS — Z8589 Personal history of malignant neoplasm of other organs and systems: Secondary | ICD-10-CM | POA: Diagnosis not present

## 2023-04-22 DIAGNOSIS — Z8249 Family history of ischemic heart disease and other diseases of the circulatory system: Secondary | ICD-10-CM

## 2023-04-22 DIAGNOSIS — G9349 Other encephalopathy: Secondary | ICD-10-CM | POA: Diagnosis present

## 2023-04-22 DIAGNOSIS — R6521 Severe sepsis with septic shock: Secondary | ICD-10-CM | POA: Diagnosis present

## 2023-04-22 DIAGNOSIS — R4020 Unspecified coma: Secondary | ICD-10-CM | POA: Diagnosis present

## 2023-04-22 DIAGNOSIS — E041 Nontoxic single thyroid nodule: Secondary | ICD-10-CM

## 2023-04-22 DIAGNOSIS — C0680 Malignant neoplasm of overlapping sites of unspecified parts of mouth: Secondary | ICD-10-CM

## 2023-04-22 DIAGNOSIS — Y848 Other medical procedures as the cause of abnormal reaction of the patient, or of later complication, without mention of misadventure at the time of the procedure: Secondary | ICD-10-CM | POA: Diagnosis not present

## 2023-04-22 DIAGNOSIS — I440 Atrioventricular block, first degree: Secondary | ICD-10-CM | POA: Diagnosis present

## 2023-04-22 DIAGNOSIS — Z85810 Personal history of malignant neoplasm of tongue: Secondary | ICD-10-CM

## 2023-04-22 DIAGNOSIS — E8721 Acute metabolic acidosis: Secondary | ICD-10-CM | POA: Diagnosis not present

## 2023-04-22 DIAGNOSIS — M419 Scoliosis, unspecified: Secondary | ICD-10-CM | POA: Diagnosis present

## 2023-04-22 DIAGNOSIS — F32A Depression, unspecified: Secondary | ICD-10-CM | POA: Diagnosis present

## 2023-04-22 DIAGNOSIS — A419 Sepsis, unspecified organism: Principal | ICD-10-CM | POA: Diagnosis present

## 2023-04-22 DIAGNOSIS — I10 Essential (primary) hypertension: Secondary | ICD-10-CM | POA: Diagnosis present

## 2023-04-22 DIAGNOSIS — Z923 Personal history of irradiation: Secondary | ICD-10-CM | POA: Diagnosis not present

## 2023-04-22 DIAGNOSIS — Z931 Gastrostomy status: Secondary | ICD-10-CM

## 2023-04-22 DIAGNOSIS — R680 Hypothermia, not associated with low environmental temperature: Secondary | ICD-10-CM | POA: Diagnosis not present

## 2023-04-22 DIAGNOSIS — J95851 Ventilator associated pneumonia: Secondary | ICD-10-CM | POA: Diagnosis not present

## 2023-04-22 DIAGNOSIS — K219 Gastro-esophageal reflux disease without esophagitis: Secondary | ICD-10-CM | POA: Diagnosis present

## 2023-04-22 DIAGNOSIS — J9600 Acute respiratory failure, unspecified whether with hypoxia or hypercapnia: Secondary | ICD-10-CM

## 2023-04-22 DIAGNOSIS — G2581 Restless legs syndrome: Secondary | ICD-10-CM | POA: Diagnosis present

## 2023-04-22 DIAGNOSIS — Z833 Family history of diabetes mellitus: Secondary | ICD-10-CM

## 2023-04-22 DIAGNOSIS — T50915A Adverse effect of multiple unspecified drugs, medicaments and biological substances, initial encounter: Secondary | ICD-10-CM | POA: Diagnosis present

## 2023-04-22 DIAGNOSIS — N390 Urinary tract infection, site not specified: Secondary | ICD-10-CM | POA: Diagnosis present

## 2023-04-22 DIAGNOSIS — I739 Peripheral vascular disease, unspecified: Secondary | ICD-10-CM | POA: Diagnosis not present

## 2023-04-22 DIAGNOSIS — G4733 Obstructive sleep apnea (adult) (pediatric): Secondary | ICD-10-CM | POA: Diagnosis present

## 2023-04-22 DIAGNOSIS — E876 Hypokalemia: Secondary | ICD-10-CM | POA: Diagnosis present

## 2023-04-22 DIAGNOSIS — G928 Other toxic encephalopathy: Secondary | ICD-10-CM | POA: Diagnosis present

## 2023-04-22 DIAGNOSIS — G9340 Encephalopathy, unspecified: Secondary | ICD-10-CM | POA: Diagnosis present

## 2023-04-22 DIAGNOSIS — D649 Anemia, unspecified: Secondary | ICD-10-CM

## 2023-04-22 DIAGNOSIS — Z79899 Other long term (current) drug therapy: Secondary | ICD-10-CM

## 2023-04-22 DIAGNOSIS — R1310 Dysphagia, unspecified: Secondary | ICD-10-CM | POA: Diagnosis present

## 2023-04-22 DIAGNOSIS — Z8261 Family history of arthritis: Secondary | ICD-10-CM

## 2023-04-22 DIAGNOSIS — Z91018 Allergy to other foods: Secondary | ICD-10-CM

## 2023-04-22 DIAGNOSIS — F419 Anxiety disorder, unspecified: Secondary | ICD-10-CM | POA: Diagnosis present

## 2023-04-22 DIAGNOSIS — E872 Acidosis, unspecified: Secondary | ICD-10-CM | POA: Diagnosis present

## 2023-04-22 DIAGNOSIS — D630 Anemia in neoplastic disease: Secondary | ICD-10-CM | POA: Diagnosis present

## 2023-04-22 DIAGNOSIS — Z87440 Personal history of urinary (tract) infections: Secondary | ICD-10-CM

## 2023-04-22 DIAGNOSIS — R569 Unspecified convulsions: Secondary | ICD-10-CM | POA: Diagnosis present

## 2023-04-22 DIAGNOSIS — Z882 Allergy status to sulfonamides status: Secondary | ICD-10-CM

## 2023-04-22 DIAGNOSIS — C760 Malignant neoplasm of head, face and neck: Secondary | ICD-10-CM

## 2023-04-22 DIAGNOSIS — Z87442 Personal history of urinary calculi: Secondary | ICD-10-CM

## 2023-04-22 DIAGNOSIS — C01 Malignant neoplasm of base of tongue: Secondary | ICD-10-CM

## 2023-04-22 DIAGNOSIS — R339 Retention of urine, unspecified: Secondary | ICD-10-CM | POA: Diagnosis present

## 2023-04-22 DIAGNOSIS — Z7982 Long term (current) use of aspirin: Secondary | ICD-10-CM

## 2023-04-22 DIAGNOSIS — M48061 Spinal stenosis, lumbar region without neurogenic claudication: Secondary | ICD-10-CM | POA: Diagnosis present

## 2023-04-22 LAB — COMPREHENSIVE METABOLIC PANEL WITH GFR
ALT: 11 U/L (ref 0–44)
AST: 20 U/L (ref 15–41)
Albumin: 3 g/dL — ABNORMAL LOW (ref 3.5–5.0)
Alkaline Phosphatase: 65 U/L (ref 38–126)
Anion gap: 10 (ref 5–15)
BUN: 18 mg/dL (ref 6–20)
CO2: 21 mmol/L — ABNORMAL LOW (ref 22–32)
Calcium: 8.1 mg/dL — ABNORMAL LOW (ref 8.9–10.3)
Chloride: 108 mmol/L (ref 98–111)
Creatinine, Ser: 1.05 mg/dL — ABNORMAL HIGH (ref 0.44–1.00)
GFR, Estimated: >60 mL/min (ref >60)
Glucose, Bld: 91 mg/dL (ref 70–99)
Potassium: 3.7 mmol/L (ref 3.5–5.1)
Sodium: 139 mmol/L (ref 135–145)
Total Bilirubin: 1 mg/dL (ref 0.0–1.2)
Total Protein: 5.5 g/dL — ABNORMAL LOW (ref 6.5–8.1)

## 2023-04-22 LAB — RAPID URINE DRUG SCREEN, HOSP PERFORMED
Amphetamines: NOT DETECTED
Barbiturates: NOT DETECTED
Benzodiazepines: NOT DETECTED
Cocaine: NOT DETECTED
Opiates: NOT DETECTED
Tetrahydrocannabinol: NOT DETECTED

## 2023-04-22 LAB — TSH: TSH: 0.618 u[IU]/mL (ref 0.350–4.500)

## 2023-04-22 LAB — BRAIN NATRIURETIC PEPTIDE: B Natriuretic Peptide: 11.4 pg/mL (ref 0.0–100.0)

## 2023-04-22 LAB — URINALYSIS, W/ REFLEX TO CULTURE (INFECTION SUSPECTED)
Bilirubin Urine: NEGATIVE
Glucose, UA: NEGATIVE mg/dL
Hgb urine dipstick: NEGATIVE
Ketones, ur: 15 mg/dL — AB
Nitrite: POSITIVE — AB
Protein, ur: NEGATIVE mg/dL
Specific Gravity, Urine: 1.01 (ref 1.005–1.030)
pH: 6 (ref 5.0–8.0)

## 2023-04-22 LAB — I-STAT CHEM 8, ED
BUN: 18 mg/dL (ref 6–20)
Calcium, Ion: 1.13 mmol/L — ABNORMAL LOW (ref 1.15–1.40)
Chloride: 107 mmol/L (ref 98–111)
Creatinine, Ser: 1.2 mg/dL — ABNORMAL HIGH (ref 0.44–1.00)
Glucose, Bld: 89 mg/dL (ref 70–99)
HCT: 30 % — ABNORMAL LOW (ref 36.0–46.0)
Hemoglobin: 10.2 g/dL — ABNORMAL LOW (ref 12.0–15.0)
Potassium: 3.7 mmol/L (ref 3.5–5.1)
Sodium: 141 mmol/L (ref 135–145)
TCO2: 23 mmol/L (ref 22–32)

## 2023-04-22 LAB — PHOSPHORUS: Phosphorus: 2.6 mg/dL (ref 2.5–4.6)

## 2023-04-22 LAB — CBC WITH DIFFERENTIAL/PLATELET
Abs Immature Granulocytes: 0.03 10*3/uL (ref 0.00–0.07)
Basophils Absolute: 0 10*3/uL (ref 0.0–0.1)
Basophils Relative: 0 %
Eosinophils Absolute: 0.1 10*3/uL (ref 0.0–0.5)
Eosinophils Relative: 1 %
HCT: 32.8 % — ABNORMAL LOW (ref 36.0–46.0)
Hemoglobin: 10.1 g/dL — ABNORMAL LOW (ref 12.0–15.0)
Immature Granulocytes: 1 %
Lymphocytes Relative: 20 %
Lymphs Abs: 0.8 10*3/uL (ref 0.7–4.0)
MCH: 28.1 pg (ref 26.0–34.0)
MCHC: 30.8 g/dL (ref 30.0–36.0)
MCV: 91.4 fL (ref 80.0–100.0)
Monocytes Absolute: 0.3 10*3/uL (ref 0.1–1.0)
Monocytes Relative: 8 %
Neutro Abs: 2.6 10*3/uL (ref 1.7–7.7)
Neutrophils Relative %: 70 %
Platelets: 227 10*3/uL (ref 150–400)
RBC: 3.59 MIL/uL — ABNORMAL LOW (ref 3.87–5.11)
RDW: 14 % (ref 11.5–15.5)
WBC: 3.8 10*3/uL — ABNORMAL LOW (ref 4.0–10.5)
nRBC: 0 % (ref 0.0–0.2)

## 2023-04-22 LAB — CK: Total CK: 721 U/L — ABNORMAL HIGH (ref 38–234)

## 2023-04-22 LAB — RESP PANEL BY RT-PCR (RSV, FLU A&B, COVID)  RVPGX2
Influenza A by PCR: NEGATIVE
Influenza B by PCR: NEGATIVE
Resp Syncytial Virus by PCR: NEGATIVE
SARS Coronavirus 2 by RT PCR: NEGATIVE

## 2023-04-22 LAB — I-STAT ARTERIAL BLOOD GAS, ED
Acid-base deficit: 5 mmol/L — ABNORMAL HIGH (ref 0.0–2.0)
Bicarbonate: 18.5 mmol/L — ABNORMAL LOW (ref 20.0–28.0)
Calcium, Ion: 1.14 mmol/L — ABNORMAL LOW (ref 1.15–1.40)
HCT: 27 % — ABNORMAL LOW (ref 36.0–46.0)
Hemoglobin: 9.2 g/dL — ABNORMAL LOW (ref 12.0–15.0)
O2 Saturation: 100 %
Patient temperature: 98.2
Potassium: 3.3 mmol/L — ABNORMAL LOW (ref 3.5–5.1)
Sodium: 140 mmol/L (ref 135–145)
TCO2: 19 mmol/L — ABNORMAL LOW (ref 22–32)
pCO2 arterial: 28.3 mmHg — ABNORMAL LOW (ref 32–48)
pH, Arterial: 7.421 (ref 7.35–7.45)
pO2, Arterial: 440 mmHg — ABNORMAL HIGH (ref 83–108)

## 2023-04-22 LAB — CORTISOL: Cortisol, Plasma: 18.6 ug/dL

## 2023-04-22 LAB — CBG MONITORING, ED
Glucose-Capillary: 97 mg/dL (ref 70–99)
Glucose-Capillary: 95 mg/dL (ref 70–99)

## 2023-04-22 LAB — AMMONIA: Ammonia: 32 umol/L (ref 9–35)

## 2023-04-22 LAB — GLUCOSE, CAPILLARY
Glucose-Capillary: 139 mg/dL — ABNORMAL HIGH (ref 70–99)
Glucose-Capillary: 118 mg/dL — ABNORMAL HIGH (ref 70–99)

## 2023-04-22 LAB — LACTIC ACID, PLASMA
Lactic Acid, Venous: 1 mmol/L (ref 0.5–1.9)
Lactic Acid, Venous: 1.1 mmol/L (ref 0.5–1.9)

## 2023-04-22 LAB — HIV ANTIBODY (ROUTINE TESTING W REFLEX): HIV Screen 4th Generation wRfx: NONREACTIVE

## 2023-04-22 LAB — PROTIME-INR
INR: 1.1 (ref 0.8–1.2)
Prothrombin Time: 14.3 s (ref 11.4–15.2)

## 2023-04-22 LAB — PROCALCITONIN: Procalcitonin: <0.1 ng/mL

## 2023-04-22 LAB — MRSA NEXT GEN BY PCR, NASAL: MRSA by PCR Next Gen: NOT DETECTED

## 2023-04-22 LAB — T4, FREE: Free T4: 1.2 ng/dL — ABNORMAL HIGH (ref 0.61–1.12)

## 2023-04-22 LAB — MAGNESIUM: Magnesium: 2 mg/dL (ref 1.7–2.4)

## 2023-04-22 LAB — HEMOGLOBIN A1C
Hgb A1c MFr Bld: 4.5 % — ABNORMAL LOW (ref 4.8–5.6)
Mean Plasma Glucose: 82.45 mg/dL

## 2023-04-22 LAB — TROPONIN I (HIGH SENSITIVITY)
Troponin I (High Sensitivity): 9 ng/L (ref <18)
Troponin I (High Sensitivity): 17 ng/L (ref <18)

## 2023-04-22 LAB — I-STAT CG4 LACTIC ACID, ED: Lactic Acid, Venous: 0.4 mmol/L — ABNORMAL LOW (ref 0.5–1.9)

## 2023-04-22 LAB — ETHANOL: Alcohol, Ethyl (B): <10 mg/dL (ref <10)

## 2023-04-22 MED ORDER — POLYETHYLENE GLYCOL 3350 17 G PO PACK: 17.0000 g | PACK | Freq: Every day | ORAL | Status: DC | PRN

## 2023-04-22 MED ORDER — PHENTOLAMINE MESYLATE 5 MG IJ SOLR
5.0000 mg | Freq: Once | INTRAMUSCULAR | Status: AC
  Administered 2023-04-22: 5 mg via SUBCUTANEOUS
  Filled 2023-04-22: qty 5

## 2023-04-22 MED ORDER — FENTANYL CITRATE PF 50 MCG/ML IJ SOSY
50.0000 ug | PREFILLED_SYRINGE | INTRAMUSCULAR | Status: DC | PRN
  Administered 2023-04-22: 50 ug via INTRAVENOUS
  Administered 2023-04-22 – 2023-04-23 (×4): 100 ug via INTRAVENOUS
  Administered 2023-04-23: 50 ug via INTRAVENOUS
  Administered 2023-04-23 (×2): 100 ug via INTRAVENOUS
  Administered 2023-04-23: 200 ug via INTRAVENOUS
  Administered 2023-04-23 (×4): 100 ug via INTRAVENOUS
  Filled 2023-04-22 (×7): qty 2
  Filled 2023-04-22: qty 4
  Filled 2023-04-22: qty 2
  Filled 2023-04-22: qty 4
  Filled 2023-04-22: qty 2

## 2023-04-22 MED ORDER — IOHEXOL 350 MG/ML SOLN
75.0000 mL | Freq: Once | INTRAVENOUS | Status: AC | PRN
  Administered 2023-04-22: 75 mL via INTRAVENOUS

## 2023-04-22 MED ORDER — VANCOMYCIN HCL IN DEXTROSE 1-5 GM/200ML-% IV SOLN: 1000.0000 mg | Freq: Once | INTRAVENOUS | Status: DC

## 2023-04-22 MED ORDER — PIPERACILLIN-TAZOBACTAM 3.375 G IVPB
3.3750 g | Freq: Three times a day (TID) | INTRAVENOUS | Status: DC
  Administered 2023-04-22 – 2023-04-26 (×12): 3.375 g via INTRAVENOUS
  Filled 2023-04-22 (×12): qty 50

## 2023-04-22 MED ORDER — CHLORHEXIDINE GLUCONATE CLOTH 2 % EX PADS
6.0000 | MEDICATED_PAD | Freq: Every day | CUTANEOUS | Status: DC
  Administered 2023-04-22 – 2023-04-28 (×6): 6 via TOPICAL

## 2023-04-22 MED ORDER — SODIUM CHLORIDE 0.9 % IV SOLN
2.0000 g | Freq: Once | INTRAVENOUS | Status: AC
  Administered 2023-04-22: 2 g via INTRAVENOUS
  Filled 2023-04-22: qty 12.5

## 2023-04-22 MED ORDER — NALOXONE HCL 2 MG/2ML IJ SOSY
PREFILLED_SYRINGE | INTRAMUSCULAR | Status: AC
  Filled 2023-04-22: qty 2

## 2023-04-22 MED ORDER — LACTATED RINGERS IV SOLN
INTRAVENOUS | Status: DC
  Administered 2023-04-23: 75 mL/h via INTRAVENOUS

## 2023-04-22 MED ORDER — ORAL CARE MOUTH RINSE
15.0000 mL | OROMUCOSAL | Status: DC
  Administered 2023-04-22 – 2023-04-25 (×34): 15 mL via OROMUCOSAL

## 2023-04-22 MED ORDER — NALOXONE HCL 2 MG/2ML IJ SOSY: 2.0000 mg | PREFILLED_SYRINGE | INTRAMUSCULAR | Status: DC | PRN

## 2023-04-22 MED ORDER — PROSOURCE TF20 ENFIT COMPATIBL EN LIQD
60.0000 mL | Freq: Every day | ENTERAL | Status: DC
  Administered 2023-04-22 – 2023-04-23 (×2): 60 mL
  Filled 2023-04-22 (×2): qty 60

## 2023-04-22 MED ORDER — VITAL HIGH PROTEIN PO LIQD
1000.0000 mL | ORAL | Status: DC
  Administered 2023-04-22: 1000 mL

## 2023-04-22 MED ORDER — NOREPINEPHRINE 4 MG/250ML-% IV SOLN
2.0000 ug/min | INTRAVENOUS | Status: DC
  Administered 2023-04-22: 5 ug/min via INTRAVENOUS

## 2023-04-22 MED ORDER — ONDANSETRON HCL 4 MG/2ML IJ SOLN: 4.0000 mg | Freq: Four times a day (QID) | INTRAMUSCULAR | Status: DC | PRN

## 2023-04-22 MED ORDER — PANTOPRAZOLE SODIUM 40 MG IV SOLR
40.0000 mg | Freq: Every day | INTRAVENOUS | Status: DC
  Administered 2023-04-22 – 2023-04-27 (×6): 40 mg via INTRAVENOUS
  Filled 2023-04-22 (×6): qty 10

## 2023-04-22 MED ORDER — ORAL CARE MOUTH RINSE: 15.0000 mL | OROMUCOSAL | Status: DC | PRN

## 2023-04-22 MED ORDER — FENTANYL CITRATE PF 50 MCG/ML IJ SOSY
50.0000 ug | PREFILLED_SYRINGE | INTRAMUSCULAR | Status: DC | PRN
  Filled 2023-04-22 (×2): qty 1

## 2023-04-22 MED ORDER — SODIUM CHLORIDE 0.9 % IV BOLUS (SEPSIS)
1000.0000 mL | Freq: Once | INTRAVENOUS | Status: AC
  Administered 2023-04-22: 1000 mL via INTRAVENOUS

## 2023-04-22 MED ORDER — VANCOMYCIN HCL 1500 MG/300ML IV SOLN
1500.0000 mg | INTRAVENOUS | Status: AC
  Administered 2023-04-22: 1500 mg via INTRAVENOUS
  Filled 2023-04-22: qty 300

## 2023-04-22 MED ORDER — NITROGLYCERIN 2 % TD OINT
1.0000 [in_us] | TOPICAL_OINTMENT | Freq: Three times a day (TID) | TRANSDERMAL | Status: AC
  Administered 2023-04-22: 1 [in_us] via TOPICAL
  Filled 2023-04-22: qty 1
  Filled 2023-04-22: qty 30

## 2023-04-22 MED ORDER — DOCUSATE SODIUM 100 MG PO CAPS: 100.0000 mg | ORAL_CAPSULE | Freq: Two times a day (BID) | ORAL | Status: DC | PRN

## 2023-04-22 MED ORDER — VANCOMYCIN HCL 1500 MG/300ML IV SOLN
1500.0000 mg | INTRAVENOUS | Status: DC
  Administered 2023-04-23: 1500 mg via INTRAVENOUS
  Filled 2023-04-22: qty 300

## 2023-04-22 MED ORDER — HEPARIN SODIUM (PORCINE) 5000 UNIT/ML IJ SOLN
5000.0000 [IU] | Freq: Three times a day (TID) | INTRAMUSCULAR | Status: DC
  Administered 2023-04-22 – 2023-04-28 (×17): 5000 [IU] via SUBCUTANEOUS
  Filled 2023-04-22 (×17): qty 1

## 2023-04-22 MED ORDER — METRONIDAZOLE 500 MG/100ML IV SOLN
500.0000 mg | Freq: Once | INTRAVENOUS | Status: AC
  Administered 2023-04-22: 500 mg via INTRAVENOUS
  Filled 2023-04-22: qty 100

## 2023-04-22 MED ORDER — FENTANYL CITRATE PF 50 MCG/ML IJ SOSY
PREFILLED_SYRINGE | INTRAMUSCULAR | Status: AC
  Filled 2023-04-22: qty 1

## 2023-04-22 MED ORDER — DOCUSATE SODIUM 50 MG/5ML PO LIQD
100.0000 mg | Freq: Two times a day (BID) | ORAL | Status: DC | PRN
  Administered 2023-04-23: 100 mg

## 2023-04-22 MED ORDER — INSULIN ASPART 100 UNIT/ML IJ SOLN
0.0000 [IU] | INTRAMUSCULAR | Status: DC
  Administered 2023-04-22: 2 [IU] via SUBCUTANEOUS
  Administered 2023-04-23: 3 [IU] via SUBCUTANEOUS
  Administered 2023-04-23: 2 [IU] via SUBCUTANEOUS
  Administered 2023-04-23: 3 [IU] via SUBCUTANEOUS
  Administered 2023-04-23: 2 [IU] via SUBCUTANEOUS
  Administered 2023-04-23 – 2023-04-24 (×2): 3 [IU] via SUBCUTANEOUS
  Administered 2023-04-24 (×2): 2 [IU] via SUBCUTANEOUS
  Administered 2023-04-24: 3 [IU] via SUBCUTANEOUS
  Administered 2023-04-25 (×2): 2 [IU] via SUBCUTANEOUS

## 2023-04-22 MED ORDER — ACETAMINOPHEN 325 MG PO TABS
650.0000 mg | ORAL_TABLET | ORAL | Status: DC | PRN
  Administered 2023-04-25: 650 mg
  Filled 2023-04-22: qty 2

## 2023-04-22 MED ORDER — NALOXONE HCL 2 MG/2ML IJ SOSY
2.0000 mg | PREFILLED_SYRINGE | Freq: Once | INTRAMUSCULAR | Status: AC
  Administered 2023-04-22: 2 mg via INTRAVENOUS

## 2023-04-22 NOTE — ED Notes (Signed)
 Received a verbal order from Dr Criss Alvine to start levophed at 
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
## 2023-04-22 NOTE — ED Notes (Signed)
 Bair hugger placed on pt.

## 2023-04-22 NOTE — Progress Notes (Signed)
 Spoke to Dr. Armanda Magic , ordered to hold off MRI until 48-72 hrs. He has already discontinued the order.
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
## 2023-04-22 NOTE — ED Triage Notes (Signed)
 Pt arrived via [facility] from home as unresponsive. Family found pt half way on bed and half way dressed at 0530 this morning. Pt LKW 2130 yesterday. Pt arrived on 2L 02 per [place]. Pt GCS 3 upon arrival. EMS gave NS 1000 mL.

## 2023-04-22 NOTE — Progress Notes (Signed)
 During bedside shift report, following deep pain stimuli, suctioning and pupil check pt suddenly became responsive but not following commands. Pt moving/flailing in the bed with random wide eyes. Pt not tracking personnel. Elink camera in for physician eyes on the pt. Following pt was given IV Fent for RASS, which she responded well to.

## 2023-04-22 NOTE — Progress Notes (Signed)
 eLink Physician-Brief Progress Note Patient Name: Kristen [name] DOB: [birth year] MRN: [id number]   Date of Service  04/22/2023  HPI/Events of Note  Contacted by bedside nurse about possible seizure activity.  Patient on EEG.  She chewing and moving arms and legs.  Not responding appropriately to verbal stimuli.  Motor activity not clearly seizures.  eICU Interventions  Await results of EEG.     Intervention Category Major Interventions: Seizures - evaluation and management  Henry Russel, P 04/22/2023, 7:20 PM
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
## 2023-04-22 NOTE — Progress Notes (Signed)
 LTM EEG hooked up and running - no initial skin breakdown - push button tested - [facility] monitoring. Jeffrey/ [facility] Nikki came up at 4:36 pm

## 2023-04-22 NOTE — Progress Notes (Signed)
 Pt transported to CT03 from ED [facility] and back by RN and RT w/o complications.

## 2023-04-22 NOTE — Sepsis Progress Note (Signed)
 Sepsis protocol monitored by eLink ?

## 2023-04-22 NOTE — Progress Notes (Signed)
 Second episode, patient spontanoeusly awakes up, lifting up and bending legs, then extending. Mom at bedisde who states patient has been having leg pain and extensive swelling bilateral legs, and that she was supposed to have received imaging of her legs yesterday  however that never happened. Patient flailing in the bed, tenses up then relaxes multiple times. Eye wide open with pupil at 8cm. Returns back to being comfortable after fentynl

## 2023-04-22 NOTE — Progress Notes (Addendum)
 CTA head/neck without explanation for current presentation  Hemodynamics improving as temp improves  On Exam Pupils 3mm, is not responding to pain -- this is consistent with how her exam was for EM team, but different than when I first consulted on her. Metabolically, thyroid studies do not explain current presentation either. Random cortisol WNL. LA normalized. Is on broad abx for possible sepsis.  CK is elevated to 700s  Think at this point reasonable to eval for possible sz. Will consult neuro and see if they are agreeable to cEEG.    Kristen Fass MSN, AGACNP-BC [facility]monary/Critical Care Medicine Amion 04/22/2023, 3:09 PM

## 2023-04-22 NOTE — Progress Notes (Signed)
 Pharmacy Antibiotic Note  Kristen [name] is a 58 y.o. female admitted on 04/22/2023 unresponsive with concern for sepsis. Patient received vancomycin, cefepime, and metronidazole in the emergency department. Pharmacy has been consulted for vancomycin and Zosyn dosing.  4/4: WBC 3.8, Scr 1.20, Lactate 0.4, HR 75, RR 22  Plan: Vancomycin 1500 mg IV every 24 hours (eAUC 534, Scr 1.2) Zosyn 3.375 IV every 8 hours Monitor renal function and vancomycin levels as appropriate F/u LOT and de-escalate antibiotics as appropriate  Height: 5\' 7"  (170.2 cm) Weight: 84.5 kg (186 lb 4.6 oz) IBW/kg (Calculated) : 61.6  Temp (24hrs), Avg:95.4 F (35.2 C), Min:93.4 F (34.1 C), Max:97.1 F (36.2 C)  Recent Labs  Lab 04/22/23 0900 04/22/23 0901 04/22/23 1225  WBC 3.8*  --   --   CREATININE 1.05* 1.20*  --   LATICACIDVEN  --  0.4* 1.0    Estimated Creatinine Clearance: 57.8 mL/min (A) (by C-G formula based on SCr of 1.2 mg/dL (H)).    Allergies  Allergen Reactions   Sulfa Antibiotics Hives   Tomato Hives and Swelling    Antimicrobials this admission: Cefepime 4/4 x1 Metronidazole 4/4 x1  Vancomycin 4/4/ >> Zosyn 4/4 >>  Microbiology results: 4/4 BCx:  4/4 UCx:  4/4 Respiratory Cx: 4/4 RVP: negative  Thank you for allowing pharmacy to be a part of this patient's care.  Enos Fling, PharmD PGY-1 Acute Care Pharmacy Resident 04/22/2023 1:13 PM

## 2023-04-22 NOTE — Progress Notes (Signed)
 LTM EEG hooked up and running - no initial skin breakdown - push button tested - [facility] monitoring.Took over [employer] from [facility] [employer]. Event button left infront of monitor

## 2023-04-22 NOTE — Progress Notes (Signed)
 Pt transported from TRAA to CT03 to 4N19 by RN and RT w/o complications.

## 2023-04-23 ENCOUNTER — Inpatient Hospital Stay (HOSPITAL_COMMUNITY)

## 2023-04-23 DIAGNOSIS — I739 Peripheral vascular disease, unspecified: Secondary | ICD-10-CM

## 2023-04-23 DIAGNOSIS — G9340 Encephalopathy, unspecified: Secondary | ICD-10-CM | POA: Diagnosis not present

## 2023-04-23 DIAGNOSIS — J9601 Acute respiratory failure with hypoxia: Secondary | ICD-10-CM | POA: Diagnosis not present

## 2023-04-23 DIAGNOSIS — R569 Unspecified convulsions: Secondary | ICD-10-CM | POA: Diagnosis not present

## 2023-04-23 DIAGNOSIS — R579 Shock, unspecified: Secondary | ICD-10-CM | POA: Diagnosis not present

## 2023-04-23 DIAGNOSIS — E8721 Acute metabolic acidosis: Secondary | ICD-10-CM | POA: Diagnosis not present

## 2023-04-23 LAB — BASIC METABOLIC PANEL WITH GFR
Anion gap: 15 (ref 5–15)
BUN: 10 mg/dL (ref 6–20)
CO2: 19 mmol/L — ABNORMAL LOW (ref 22–32)
Calcium: 8.9 mg/dL (ref 8.9–10.3)
Chloride: 110 mmol/L (ref 98–111)
Creatinine, Ser: 0.91 mg/dL (ref 0.44–1.00)
GFR, Estimated: >60 mL/min (ref >60)
Glucose, Bld: 140 mg/dL — ABNORMAL HIGH (ref 70–99)
Potassium: 3.2 mmol/L — ABNORMAL LOW (ref 3.5–5.1)
Sodium: 144 mmol/L (ref 135–145)

## 2023-04-23 LAB — MAGNESIUM: Magnesium: 1.8 mg/dL (ref 1.7–2.4)

## 2023-04-23 LAB — GLUCOSE, CAPILLARY
Glucose-Capillary: 144 mg/dL — ABNORMAL HIGH (ref 70–99)
Glucose-Capillary: 164 mg/dL — ABNORMAL HIGH (ref 70–99)
Glucose-Capillary: 180 mg/dL — ABNORMAL HIGH (ref 70–99)
Glucose-Capillary: 178 mg/dL — ABNORMAL HIGH (ref 70–99)
Glucose-Capillary: 128 mg/dL — ABNORMAL HIGH (ref 70–99)
Glucose-Capillary: 160 mg/dL — ABNORMAL HIGH (ref 70–99)

## 2023-04-23 LAB — PHOSPHORUS: Phosphorus: 2.6 mg/dL (ref 2.5–4.6)

## 2023-04-23 LAB — CBC
HCT: 35.2 % — ABNORMAL LOW (ref 36.0–46.0)
Hemoglobin: 11.6 g/dL — ABNORMAL LOW (ref 12.0–15.0)
MCH: 28.2 pg (ref 26.0–34.0)
MCHC: 33 g/dL (ref 30.0–36.0)
MCV: 85.6 fL (ref 80.0–100.0)
Platelets: 264 10*3/uL (ref 150–400)
RBC: 4.11 MIL/uL (ref 3.87–5.11)
RDW: 14.4 % (ref 11.5–15.5)
WBC: 6.5 10*3/uL (ref 4.0–10.5)
nRBC: 0 % (ref 0.0–0.2)

## 2023-04-23 LAB — URINE CULTURE: Culture: NO GROWTH

## 2023-04-23 LAB — CK: Total CK: 727 U/L — ABNORMAL HIGH (ref 38–234)

## 2023-04-23 MED ORDER — FENTANYL 2500MCG IN NS 250ML (10MCG/ML) PREMIX INFUSION
50.0000 ug/h | INTRAVENOUS | Status: DC
Start: 2023-04-23 — End: 2023-04-25
  Administered 2023-04-23: 50 ug/h via INTRAVENOUS
  Administered 2023-04-24: 100 ug/h via INTRAVENOUS
  Administered 2023-04-24: 150 ug/h via INTRAVENOUS
  Filled 2023-04-23 (×3): qty 250

## 2023-04-23 MED ORDER — POLYETHYLENE GLYCOL 3350 17 G PO PACK
17.0000 g | PACK | Freq: Every day | ORAL | Status: DC
  Administered 2023-04-23 – 2023-04-25 (×3): 17 g
  Filled 2023-04-23 (×3): qty 1

## 2023-04-23 MED ORDER — SODIUM CHLORIDE 0.9 % IV SOLN
500.0000 mg | Freq: Three times a day (TID) | INTRAVENOUS | Status: AC
  Administered 2023-04-23 – 2023-04-24 (×6): 500 mg via INTRAVENOUS
  Filled 2023-04-23: qty 5
  Filled 2023-04-23: qty 2
  Filled 2023-04-23 (×2): qty 5
  Filled 2023-04-23: qty 500
  Filled 2023-04-23: qty 5

## 2023-04-23 MED ORDER — THIAMINE HCL 100 MG/ML IJ SOLN
250.0000 mg | Freq: Every day | INTRAVENOUS | Status: DC
  Administered 2023-04-25 – 2023-04-28 (×4): 250 mg via INTRAVENOUS
  Filled 2023-04-23 (×4): qty 2.5

## 2023-04-23 MED ORDER — GADOBUTROL 1 MMOL/ML IV SOLN
8.0000 mL | Freq: Once | INTRAVENOUS | Status: AC | PRN
  Administered 2023-04-23: 8 mL via INTRAVENOUS

## 2023-04-23 MED ORDER — VITAL HIGH PROTEIN PO LIQD
1000.0000 mL | ORAL | Status: DC
  Administered 2023-04-23: 1000 mL

## 2023-04-23 MED ORDER — POTASSIUM CHLORIDE 20 MEQ PO PACK
40.0000 meq | PACK | Freq: Four times a day (QID) | ORAL | Status: AC
Start: 2023-04-23 — End: 2023-04-23
  Administered 2023-04-23 (×2): 40 meq
  Filled 2023-04-23 (×2): qty 2

## 2023-04-23 MED ORDER — FENTANYL CITRATE PF 50 MCG/ML IJ SOSY: 50.0000 ug | PREFILLED_SYRINGE | Freq: Once | INTRAMUSCULAR | Status: DC

## 2023-04-23 MED ORDER — FENTANYL BOLUS VIA INFUSION
50.0000 ug | INTRAVENOUS | Status: DC | PRN
  Administered 2023-04-23 – 2023-04-24 (×12): 100 ug via INTRAVENOUS

## 2023-04-23 MED ORDER — VITAL AF 1.2 CAL PO LIQD
1000.0000 mL | ORAL | Status: DC
  Administered 2023-04-24 – 2023-04-27 (×4): 1000 mL
  Filled 2023-04-23 (×3): qty 1000

## 2023-04-23 MED ORDER — DOCUSATE SODIUM 50 MG/5ML PO LIQD
100.0000 mg | Freq: Two times a day (BID) | ORAL | Status: DC
  Administered 2023-04-24 – 2023-04-25 (×3): 100 mg
  Filled 2023-04-23 (×4): qty 10

## 2023-04-23 MED ORDER — THIAMINE HCL 100 MG/ML IJ SOLN: 100.0000 mg | Freq: Every day | INTRAMUSCULAR | Status: DC

## 2023-04-23 NOTE — Progress Notes (Signed)
 Patient transported from 4N19 to MRI and back to 4N19 with no complications.
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
## 2023-04-23 NOTE — Plan of Care (Signed)
  Problem: Fluid Volume: Goal: Ability to maintain a balanced intake and output will improve Outcome: Progressing   Problem: Fluid Volume: Goal: Ability to maintain a balanced intake and output will improve Outcome: Progressing

## 2023-04-23 NOTE — Progress Notes (Signed)
 LTM maint complete - no skin breakdown seen Serviced C3 Cz A2 P4 [facility] monitored, Event button test confirmed by [facility].

## 2023-04-23 NOTE — Progress Notes (Signed)
 Multiple episodes where patient wakes up and trashes in the bed, unable to follow commands , patient does not track

## 2023-04-23 NOTE — Progress Notes (Signed)
 LTM EEG discontinued - no skin breakdown at [facility].
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
## 2023-04-23 NOTE — Progress Notes (Signed)
.  ltm 
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
## 2023-04-24 DIAGNOSIS — J9601 Acute respiratory failure with hypoxia: Secondary | ICD-10-CM | POA: Diagnosis not present

## 2023-04-24 DIAGNOSIS — I739 Peripheral vascular disease, unspecified: Secondary | ICD-10-CM | POA: Diagnosis not present

## 2023-04-24 DIAGNOSIS — E8721 Acute metabolic acidosis: Secondary | ICD-10-CM | POA: Diagnosis not present

## 2023-04-24 DIAGNOSIS — G9340 Encephalopathy, unspecified: Secondary | ICD-10-CM | POA: Diagnosis not present

## 2023-04-24 DIAGNOSIS — R579 Shock, unspecified: Secondary | ICD-10-CM | POA: Diagnosis not present

## 2023-04-24 LAB — BASIC METABOLIC PANEL WITH GFR
Anion gap: 10 (ref 5–15)
BUN: 10 mg/dL (ref 6–20)
CO2: 25 mmol/L (ref 22–32)
Calcium: 9.5 mg/dL (ref 8.9–10.3)
Chloride: 104 mmol/L (ref 98–111)
Creatinine, Ser: 0.61 mg/dL (ref 0.44–1.00)
GFR, Estimated: >60 mL/min (ref >60)
Glucose, Bld: 158 mg/dL — ABNORMAL HIGH (ref 70–99)
Potassium: 3.8 mmol/L (ref 3.5–5.1)
Sodium: 139 mmol/L (ref 135–145)

## 2023-04-24 LAB — CBC
HCT: 36.6 % (ref 36.0–46.0)
Hemoglobin: 12.2 g/dL (ref 12.0–15.0)
MCH: 28.8 pg (ref 26.0–34.0)
MCHC: 33.3 g/dL (ref 30.0–36.0)
MCV: 86.3 fL (ref 80.0–100.0)
Platelets: 255 10*3/uL (ref 150–400)
RBC: 4.24 MIL/uL (ref 3.87–5.11)
RDW: 14.6 % (ref 11.5–15.5)
WBC: 11.3 10*3/uL — ABNORMAL HIGH (ref 4.0–10.5)
nRBC: 0 % (ref 0.0–0.2)

## 2023-04-24 LAB — GLUCOSE, CAPILLARY
Glucose-Capillary: 171 mg/dL — ABNORMAL HIGH (ref 70–99)
Glucose-Capillary: 123 mg/dL — ABNORMAL HIGH (ref 70–99)
Glucose-Capillary: 148 mg/dL — ABNORMAL HIGH (ref 70–99)
Glucose-Capillary: 120 mg/dL — ABNORMAL HIGH (ref 70–99)
Glucose-Capillary: 106 mg/dL — ABNORMAL HIGH (ref 70–99)
Glucose-Capillary: 124 mg/dL — ABNORMAL HIGH (ref 70–99)

## 2023-04-24 LAB — MAGNESIUM: Magnesium: 1.8 mg/dL (ref 1.7–2.4)

## 2023-04-24 LAB — PHOSPHORUS: Phosphorus: 1.7 mg/dL — ABNORMAL LOW (ref 2.5–4.6)

## 2023-04-24 MED ORDER — POTASSIUM PHOSPHATES 15 MMOLE/5ML IV SOLN
30.0000 mmol | Freq: Once | INTRAVENOUS | Status: AC
  Administered 2023-04-24: 30 mmol via INTRAVENOUS
  Filled 2023-04-24: qty 10

## 2023-04-24 MED ORDER — MAGNESIUM SULFATE 2 GM/50ML IV SOLN
2.0000 g | Freq: Once | INTRAVENOUS | Status: AC
  Administered 2023-04-24: 2 g via INTRAVENOUS
  Filled 2023-04-24: qty 50
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
## 2023-04-24 NOTE — Progress Notes (Signed)
 [facility] ADULT ICU REPLACEMENT PROTOCOL   The patient does apply for the [facility] Adult ICU Electrolyte Replacment Protocol based on the criteria listed below:   1.Exclusion criteria: TCTS, ECMO, Dialysis, and Myasthenia Gravis patients 2. Is GFR >/= 30 ml/min? Yes.    Patient's GFR today is >60 3. Is SCr </= 2? Yes.   Patient's SCr is 0.61 mg/dL 4. Did SCr increase >/= 0.5 in 24 hours? No. 5.Pt's weight >40kg  Yes.   6. Abnormal electrolyte(s): Phos, Mag  7. Electrolytes replaced per protocol 8.  Call MD STAT for K+ </= 2.5, Phos </= 1, or Mag </= 1 Physician:  Flossie Buffy E Alfredo Spong 04/24/2023 6:04 AM

## 2023-04-24 NOTE — Plan of Care (Signed)
 Progressing toward goals. Patient following commands this AM, favors left extremities.

## 2023-04-25 ENCOUNTER — Telehealth: Payer: Self-pay

## 2023-04-25 ENCOUNTER — Telehealth: Payer: Self-pay | Admitting: Orthopedic Surgery

## 2023-04-25 DIAGNOSIS — R579 Shock, unspecified: Secondary | ICD-10-CM | POA: Diagnosis not present

## 2023-04-25 DIAGNOSIS — E876 Hypokalemia: Secondary | ICD-10-CM | POA: Diagnosis not present

## 2023-04-25 DIAGNOSIS — G9340 Encephalopathy, unspecified: Secondary | ICD-10-CM | POA: Diagnosis not present

## 2023-04-25 LAB — PROCALCITONIN: Procalcitonin: <0.1 ng/mL

## 2023-04-25 LAB — GLUCOSE, CAPILLARY
Glucose-Capillary: 123 mg/dL — ABNORMAL HIGH (ref 70–99)
Glucose-Capillary: 112 mg/dL — ABNORMAL HIGH (ref 70–99)
Glucose-Capillary: 123 mg/dL — ABNORMAL HIGH (ref 70–99)
Glucose-Capillary: 117 mg/dL — ABNORMAL HIGH (ref 70–99)
Glucose-Capillary: 107 mg/dL — ABNORMAL HIGH (ref 70–99)
Glucose-Capillary: 95 mg/dL (ref 70–99)

## 2023-04-25 LAB — CBC
HCT: 44.5 % (ref 36.0–46.0)
Hemoglobin: 14.4 g/dL (ref 12.0–15.0)
MCH: 28.1 pg (ref 26.0–34.0)
MCHC: 32.4 g/dL (ref 30.0–36.0)
MCV: 86.9 fL (ref 80.0–100.0)
Platelets: 219 10*3/uL (ref 150–400)
RBC: 5.12 MIL/uL — ABNORMAL HIGH (ref 3.87–5.11)
RDW: 14.7 % (ref 11.5–15.5)
WBC: 11.7 10*3/uL — ABNORMAL HIGH (ref 4.0–10.5)
nRBC: 0 % (ref 0.0–0.2)

## 2023-04-25 LAB — PHOSPHORUS: Phosphorus: 4 mg/dL (ref 2.5–4.6)

## 2023-04-25 LAB — BASIC METABOLIC PANEL WITH GFR
Anion gap: 15 (ref 5–15)
BUN: 13 mg/dL (ref 6–20)
CO2: 21 mmol/L — ABNORMAL LOW (ref 22–32)
Calcium: 10 mg/dL (ref 8.9–10.3)
Chloride: 102 mmol/L (ref 98–111)
Creatinine, Ser: 0.61 mg/dL (ref 0.44–1.00)
GFR, Estimated: >60 mL/min (ref >60)
Glucose, Bld: 113 mg/dL — ABNORMAL HIGH (ref 70–99)
Potassium: 4.2 mmol/L (ref 3.5–5.1)
Sodium: 138 mmol/L (ref 135–145)

## 2023-04-25 LAB — MAGNESIUM: Magnesium: 2.3 mg/dL (ref 1.7–2.4)

## 2023-04-25 MED ORDER — ORAL CARE MOUTH RINSE: 15.0000 mL | OROMUCOSAL | Status: DC | PRN

## 2023-04-25 MED ORDER — SODIUM PHOSPHATES 45 MMOLE/15ML IV SOLN
30.0000 mmol | Freq: Once | INTRAVENOUS | Status: DC
  Filled 2023-04-25: qty 10

## 2023-04-25 MED ORDER — HYDRALAZINE HCL 20 MG/ML IJ SOLN
10.0000 mg | INTRAMUSCULAR | Status: DC | PRN
  Administered 2023-04-25: 10 mg via INTRAVENOUS
  Filled 2023-04-25: qty 1

## 2023-04-25 MED ORDER — POTASSIUM CHLORIDE 20 MEQ PO PACK: 40.0000 meq | PACK | Freq: Once | ORAL | Status: DC

## 2023-04-25 MED ORDER — BACLOFEN 10 MG PO TABS: 10.0000 mg | ORAL_TABLET | Freq: Three times a day (TID) | ORAL | 2 refills | Status: AC

## 2023-04-25 MED ORDER — MAGNESIUM SULFATE 2 GM/50ML IV SOLN: 2.0000 g | Freq: Once | INTRAVENOUS | Status: DC

## 2023-04-25 NOTE — Progress Notes (Signed)
 eLink Physician-Brief Progress Note Patient Name: Kristen [name] DOB: 1965/08/19 MRN: [id number]   Date of Service  04/25/2023  HPI/Events of Note  Hypertensive with elevated diastolic pressures.  eICU Interventions  Add hydralazine as needed     Intervention Category Intermediate Interventions: Hypertension - evaluation and management  [name] 04/25/2023, 4:32 AM

## 2023-04-25 NOTE — Plan of Care (Signed)
  Problem: Skin Integrity: Goal: Risk for impaired skin integrity will decrease Outcome: Progressing   Problem: Tissue Perfusion: Goal: Adequacy of tissue perfusion will improve Outcome: Progressing   Problem: Clinical Measurements: Goal: Diagnostic test results will improve Outcome: Progressing Goal: Respiratory complications will improve Outcome: Progressing Goal: Cardiovascular complication will be avoided Outcome: Progressing   Problem: Activity: Goal: Risk for activity intolerance will decrease Outcome: Progressing   Problem: Nutrition: Goal: Adequate nutrition will be maintained Outcome: Progressing   Problem: Coping: Goal: Level of anxiety will decrease Outcome: Progressing   Problem: Pain Managment: Goal: General experience of comfort will improve and/or be controlled Outcome: Progressing   Problem: Safety: Goal: Ability to remain free from injury will improve Outcome: Progressing   Problem: Skin Integrity: Goal: Risk for impaired skin integrity will decrease Outcome: Progressing
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
## 2023-04-25 NOTE — Addendum Note (Signed)
 Addended by: Willia Craze on: 04/25/2023 10:41 AM   Modules accepted: Orders
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
## 2023-04-25 NOTE — Telephone Encounter (Signed)
 [facility] called. They are wanting to refill the patient's Baclofen. The fax #[phone]

## 2023-04-25 NOTE — Procedures (Signed)
 Extubation Procedure Note  Patient Details:   Name: Kristen [name] DOB: Dec 27, 1965 MRN: [id number]   Airway Documentation:    Vent end date: 04/25/23 Vent end time: 0841   Evaluation  O2 sats: stable throughout Complications: No apparent complications Patient did tolerate procedure well. Bilateral Breath Sounds: Diminished, Rhonchi   Yes  Patient extubated to 2L [place], Pt. Tolerated well. Cuff leak present. No stridor noted. RN at bedside. CCMD aware.   Thornell Mule 04/25/2023, 8:42 AM

## 2023-04-26 ENCOUNTER — Inpatient Hospital Stay (HOSPITAL_COMMUNITY)

## 2023-04-26 DIAGNOSIS — E876 Hypokalemia: Secondary | ICD-10-CM | POA: Diagnosis not present

## 2023-04-26 DIAGNOSIS — G9340 Encephalopathy, unspecified: Secondary | ICD-10-CM | POA: Diagnosis not present

## 2023-04-26 DIAGNOSIS — R579 Shock, unspecified: Secondary | ICD-10-CM | POA: Diagnosis not present

## 2023-04-26 LAB — CBC
HCT: 42.4 % (ref 36.0–46.0)
Hemoglobin: 13.6 g/dL (ref 12.0–15.0)
MCH: 28.4 pg (ref 26.0–34.0)
MCHC: 32.1 g/dL (ref 30.0–36.0)
MCV: 88.5 fL (ref 80.0–100.0)
Platelets: 243 10*3/uL (ref 150–400)
RBC: 4.79 MIL/uL (ref 3.87–5.11)
RDW: 14.7 % (ref 11.5–15.5)
WBC: 6.9 10*3/uL (ref 4.0–10.5)
nRBC: 0 % (ref 0.0–0.2)

## 2023-04-26 LAB — GLUCOSE, CAPILLARY
Glucose-Capillary: 96 mg/dL (ref 70–99)
Glucose-Capillary: 105 mg/dL — ABNORMAL HIGH (ref 70–99)
Glucose-Capillary: 114 mg/dL — ABNORMAL HIGH (ref 70–99)
Glucose-Capillary: 93 mg/dL (ref 70–99)
Glucose-Capillary: 99 mg/dL (ref 70–99)

## 2023-04-26 LAB — BASIC METABOLIC PANEL WITH GFR
Anion gap: 13 (ref 5–15)
BUN: 17 mg/dL (ref 6–20)
CO2: 27 mmol/L (ref 22–32)
Calcium: 10.3 mg/dL (ref 8.9–10.3)
Chloride: 99 mmol/L (ref 98–111)
Creatinine, Ser: 0.75 mg/dL (ref 0.44–1.00)
GFR, Estimated: >60 mL/min (ref >60)
Glucose, Bld: 104 mg/dL — ABNORMAL HIGH (ref 70–99)
Potassium: 5.2 mmol/L — ABNORMAL HIGH (ref 3.5–5.1)
Sodium: 139 mmol/L (ref 135–145)

## 2023-04-26 LAB — MAGNESIUM: Magnesium: 2.5 mg/dL — ABNORMAL HIGH (ref 1.7–2.4)

## 2023-04-26 LAB — PHOSPHORUS: Phosphorus: 4.5 mg/dL (ref 2.5–4.6)

## 2023-04-26 MED ORDER — FUROSEMIDE 40 MG PO TABS
40.0000 mg | ORAL_TABLET | Freq: Every day | ORAL | Status: DC
  Administered 2023-04-26 – 2023-04-27 (×2): 40 mg
  Filled 2023-04-26 (×2): qty 1

## 2023-04-26 MED ORDER — SODIUM CHLORIDE 0.9 % IV SOLN
1.0000 g | INTRAVENOUS | Status: DC
  Administered 2023-04-26: 1 g via INTRAVENOUS
  Filled 2023-04-26 (×2): qty 10
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
## 2023-04-26 NOTE — Plan of Care (Signed)
   Problem: Education: Goal: Ability to describe self-care measures that may prevent or decrease complications (Diabetes Survival Skills Education) will improve Outcome: Progressing   Problem: Fluid Volume: Goal: Ability to maintain a balanced intake and output will improve Outcome: Progressing   Problem: Metabolic: Goal: Ability to maintain appropriate glucose levels will improve Outcome: Progressing   Problem: Nutritional: Goal: Maintenance of adequate nutrition will improve Outcome: Progressing   Problem: Skin Integrity: Goal: Risk for impaired skin integrity will decrease Outcome: Progressing

## 2023-04-26 NOTE — TOC Progression Note (Signed)
 Transition of Care (TOC) - Progression Note   Kandee Keen with [facility] accepted referral for home health  Ordered walker with Jermaine with [employer]  Patient Details  Name: Kristen [name] MRN: [id number] Date of Birth: 1965-02-12  Transition of [facility]) CM/SW Contact  Mead Slane, Adria Devon, RN Phone Number: 04/26/2023, 2:20 PM  Clinical Narrative:       Expected Discharge Plan: Home w Home Health Services Barriers to Discharge: Continued Medical Work up  Expected Discharge Plan and Services   Discharge Planning Services: CM Consult Post Acute Care Choice: Home Health Living arrangements for the past 2 months: Single Family Home                           HH Arranged: OT, PT, Speech Therapy           Social Determinants of Health (SDOH) Interventions SDOH Screenings   Food Insecurity: Food Insecurity Present (01/14/2023)  Housing: Low Risk  (01/14/2023)  Transportation Needs: No Transportation Needs (01/14/2023)  Utilities: Not At Risk (01/14/2023)  Alcohol Screen: Low Risk  (06/04/2021)  Depression (PHQ2-9): Medium Risk (08/28/2021)  Financial Resource Strain: Medium Risk (06/04/2021)  Physical Activity: Insufficiently Active (06/04/2021)  Social Connections: Moderately Integrated (06/04/2021)  Recent Concern: Social Connections - Moderately Isolated (03/31/2021)  Stress: No Stress Concern Present (06/04/2021)  Recent Concern: Stress - Stress Concern Present (03/31/2021)  Tobacco Use: Low Risk  (04/22/2023)    Readmission Risk Interventions     No data to display

## 2023-04-26 NOTE — Plan of Care (Signed)
  Problem: Fluid Volume: Goal: Ability to maintain a balanced intake and output will improve Outcome: Progressing   Problem: Nutritional: Goal: Maintenance of adequate nutrition will improve Outcome: Progressing   Problem: Skin Integrity: Goal: Risk for impaired skin integrity will decrease Outcome: Progressing   Problem: Tissue Perfusion: Goal: Adequacy of tissue perfusion will improve Outcome: Progressing   Problem: Clinical Measurements: Goal: Diagnostic test results will improve Outcome: Progressing Goal: Respiratory complications will improve Outcome: Progressing Goal: Cardiovascular complication will be avoided Outcome: Progressing   Problem: Activity: Goal: Risk for activity intolerance will decrease Outcome: Progressing   Problem: Nutrition: Goal: Adequate nutrition will be maintained Outcome: Progressing   Problem: Coping: Goal: Level of anxiety will decrease Outcome: Progressing   Problem: Elimination: Goal: Will not experience complications related to bowel motility Outcome: Progressing Goal: Will not experience complications related to urinary retention Outcome: Progressing   Problem: Pain Managment: Goal: General experience of comfort will improve and/or be controlled Outcome: Progressing   Problem: Safety: Goal: Ability to remain free from injury will improve Outcome: Progressing   Problem: Skin Integrity: Goal: Risk for impaired skin integrity will decrease Outcome: Progressing

## 2023-04-26 NOTE — TOC Initial Note (Addendum)
 Transition of Care [facility]) - Initial/Assessment Note    Patient Details  Name: Kristen [name] MRN: [id number] Date of Birth: 01-21-1965  Transition of Care [facility]) CM/SW Contact:    Kermit Balo, RN Phone Number: 04/26/2023, 12:57 PM  Clinical Narrative:                  Pt is from home with her mom and daughter. She says someone is with her most of the time and they are able to assist.  Pt manages her own medications and denies any issues. Pt with PEG from home. She states she was driving self but her mother or daughter can assist.  No DME. Pt will need a walker prior to d/c home. She has no preference on agency.  She is also agreeable to [facility] services. She was provided choice and has no preference. CM will arrange prior to d/c home. Pt states her family will provide transport home. TOC following.  Expected Discharge Plan: Home w Home Health Services Barriers to Discharge: Continued Medical Work up   Patient Goals and CMS Choice   CMS Medicare.gov Compare Post Acute Care list provided to:: Patient Choice offered to / list presented to : Patient      Expected Discharge Plan and Services   Discharge Planning Services: CM Consult Post Acute Care Choice: Home Health Living arrangements for the past 2 months: Single Family Home                           HH Arranged: OT, PT, Speech Therapy          Prior Living Arrangements/Services Living arrangements for the past 2 months: Single Family Home Lives with:: Parents, Adult Children Patient language and need for interpreter reviewed:: Yes Do you feel safe going back to the place where you live?: Yes        Care giver support system in place?: Yes (comment)   Criminal Activity/Legal Involvement Pertinent to Current Situation/Hospitalization: No - Comment as needed  Activities of Daily Living      Permission Sought/Granted                  Emotional Assessment Appearance:: Appears older than stated  age Attitude/Demeanor/Rapport: Engaged Affect (typically observed): Accepting Orientation: : Oriented to Self, Oriented to Place, Oriented to  Time, Oriented to Situation   Psych Involvement: No (comment)  Admission diagnosis:  Shock (HCC) [R57.9] Acute encephalopathy [G93.40] Acute respiratory failure, unspecified whether with hypoxia or hypercapnia (HCC) [J96.00] Patient Active Problem List   Diagnosis Date Noted   Acute encephalopathy 04/22/2023   Enteritis 01/12/2023   Generalized weakness 07/18/2022   Dehydration 07/18/2022   Tension headache 07/18/2022   Depression 07/18/2022   History of tongue cancer 07/18/2022   PEG tube malfunction (HCC) 02/28/2022   Protein-calorie malnutrition, severe 04/29/2021   Pseudoarthrosis of lumbar spine 04/28/2021   Fusion of spine of lumbar region 04/28/2021   Pseudarthrosis after fusion or arthrodesis 04/20/2021    Class: Chronic   Tenosynovitis, de Quervain 12/14/2020   Arthritis of carpometacarpal (CMC) joint of left thumb 12/14/2020   Aortic atherosclerosis (HCC) 08/20/2020   Hyperkalemia 07/24/2020   Alcohol use 08/05/2019   Bilateral lower extremity edema 08/05/2019   Peripheral edema 03/12/2019   Iron deficiency anemia 12/11/2018   Loosening of hardware in spine [facility])    Other spondylosis with radiculopathy, lumbar region    History of lumbar spinal fusion 11/06/2018  UTI (urinary tract infection) 05/15/2017   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   Malignant neoplasm of base of tongue (HCC) 10/29/2016   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   Headache associated with sexual activity 05/14/2016   Tongue lesion 05/14/2016   Thrombocytopenia (HCC) 01/22/2016   Chronic diarrhea 01/22/2016   Chest pain    Essential hypertension    Spinal stenosis of lumbar region 01/20/2016    Class: Chronic   DDD (degenerative disc disease), lumbar 01/20/2016    Class: Chronic   Spinal stenosis of  lumbar region with neurogenic claudication 01/20/2016   Low back pain 12/22/2015   Hypersomnolence 10/13/2015   Muscle cramp 08/01/2015   GERD (gastroesophageal reflux disease) 07/09/2015   Anemia of chronic disease 01/08/2013   PCP:  [name], MD Pharmacy:   [facility] [phone] - [place], [place] - [address] AT [facility] OF [address] [place] [address] Phone: [phone] Fax: [phone]  [facility] [place] [address] [place] [address] Phone: [phone] Fax: [phone]  [id number] - [place], [place] - [address] AT [facility] OF [address] [place] [address] Phone: [phone] Fax: [phone]     Social Drivers of Health (SDOH) Social History: SDOH Screenings   Food Insecurity: Food Insecurity Present (01/14/2023)  Housing: Low Risk  (01/14/2023)  Transportation Needs: No Transportation Needs (01/14/2023)  Utilities: Not At Risk (01/14/2023)  Alcohol Screen: Low Risk  (06/04/2021)  Depression (PHQ2-9): Medium Risk (08/28/2021)  Financial Resource Strain: Medium Risk (06/04/2021)  Physical Activity: Insufficiently Active (06/04/2021)  Social Connections: Moderately Integrated (06/04/2021)  Recent Concern: Social Connections - Moderately Isolated (03/31/2021)  Stress: No Stress Concern Present (06/04/2021)  Recent Concern: Stress - Stress Concern Present (03/31/2021)  Tobacco Use: Low Risk  (04/22/2023)   SDOH Interventions:     Readmission Risk Interventions     No data to display
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
## 2023-04-27 ENCOUNTER — Other Ambulatory Visit: Payer: Self-pay

## 2023-04-27 ENCOUNTER — Ambulatory Visit: Payer: Medicare Other | Admitting: Orthopedic Surgery

## 2023-04-27 DIAGNOSIS — R579 Shock, unspecified: Secondary | ICD-10-CM | POA: Diagnosis not present

## 2023-04-27 LAB — GLUCOSE, CAPILLARY
Glucose-Capillary: 110 mg/dL — ABNORMAL HIGH (ref 70–99)
Glucose-Capillary: 126 mg/dL — ABNORMAL HIGH (ref 70–99)
Glucose-Capillary: 127 mg/dL — ABNORMAL HIGH (ref 70–99)
Glucose-Capillary: 129 mg/dL — ABNORMAL HIGH (ref 70–99)
Glucose-Capillary: 135 mg/dL — ABNORMAL HIGH (ref 70–99)
Glucose-Capillary: 92 mg/dL (ref 70–99)

## 2023-04-27 LAB — BASIC METABOLIC PANEL WITH GFR
Anion gap: 13 (ref 5–15)
BUN: 23 mg/dL — ABNORMAL HIGH (ref 6–20)
CO2: 25 mmol/L (ref 22–32)
Calcium: 10 mg/dL (ref 8.9–10.3)
Chloride: 102 mmol/L (ref 98–111)
Creatinine, Ser: 0.68 mg/dL (ref 0.44–1.00)
GFR, Estimated: >60 mL/min (ref >60)
Glucose, Bld: 114 mg/dL — ABNORMAL HIGH (ref 70–99)
Potassium: 3.6 mmol/L (ref 3.5–5.1)
Sodium: 140 mmol/L (ref 135–145)

## 2023-04-27 LAB — CBC
HCT: 40.2 % (ref 36.0–46.0)
Hemoglobin: 12.9 g/dL (ref 12.0–15.0)
MCH: 28.2 pg (ref 26.0–34.0)
MCHC: 32.1 g/dL (ref 30.0–36.0)
MCV: 87.8 fL (ref 80.0–100.0)
Platelets: 247 10*3/uL (ref 150–400)
RBC: 4.58 MIL/uL (ref 3.87–5.11)
RDW: 14.6 % (ref 11.5–15.5)
WBC: 5.9 10*3/uL (ref 4.0–10.5)
nRBC: 0 % (ref 0.0–0.2)

## 2023-04-27 LAB — CULTURE, BLOOD (ROUTINE X 2)
Culture: NO GROWTH
Culture: NO GROWTH

## 2023-04-27 LAB — MAGNESIUM: Magnesium: 2.3 mg/dL (ref 1.7–2.4)

## 2023-04-27 LAB — PHOSPHORUS: Phosphorus: 3.8 mg/dL (ref 2.5–4.6)

## 2023-04-27 MED ORDER — FERROUS SULFATE 325 (65 FE) MG PO TABS
325.0000 mg | ORAL_TABLET | Freq: Every day | ORAL | Status: DC
  Administered 2023-04-28: 325 mg via ORAL
  Filled 2023-04-27: qty 1

## 2023-04-27 MED ORDER — BACLOFEN 10 MG PO TABS
10.0000 mg | ORAL_TABLET | Freq: Three times a day (TID) | ORAL | Status: DC
  Administered 2023-04-27 – 2023-04-28 (×3): 10 mg
  Filled 2023-04-27 (×3): qty 1

## 2023-04-27 MED ORDER — FOLIC ACID 1 MG PO TABS
0.5000 mg | ORAL_TABLET | Freq: Every day | ORAL | Status: DC
  Administered 2023-04-27 – 2023-04-28 (×2): 0.5 mg
  Filled 2023-04-27 (×2): qty 1

## 2023-04-27 MED ORDER — BOOST / RESOURCE BREEZE PO LIQD CUSTOM: 1.0000 | Freq: Three times a day (TID) | ORAL | Status: DC

## 2023-04-27 MED ORDER — NAPROXEN 250 MG PO TABS: 500.0000 mg | ORAL_TABLET | Freq: Two times a day (BID) | ORAL | Status: DC | PRN

## 2023-04-27 MED ORDER — JEVITY 1.5 CAL/FIBER PO LIQD: 237.0000 mL | Freq: Four times a day (QID) | ORAL | Status: DC

## 2023-04-27 MED ORDER — JEVITY 1.5 CAL/FIBER PO LIQD
237.0000 mL | Freq: Four times a day (QID) | ORAL | Status: DC
  Administered 2023-04-27: 237 mL
  Filled 2023-04-27 (×6): qty 237

## 2023-04-27 MED ORDER — FOLIC ACID 400 MCG PO TABS: 400.0000 ug | ORAL_TABLET | Freq: Every day | ORAL | Status: DC

## 2023-04-27 MED ORDER — ADULT MULTIVITAMIN W/MINERALS CH
1.0000 | ORAL_TABLET | Freq: Every day | ORAL | Status: DC
  Administered 2023-04-27 – 2023-04-28 (×2): 1
  Filled 2023-04-27 (×2): qty 1

## 2023-04-27 MED ORDER — BOOST / RESOURCE BREEZE PO LIQD CUSTOM
1.0000 | Freq: Two times a day (BID) | ORAL | Status: DC
  Administered 2023-04-27: 1 via ORAL

## 2023-04-27 NOTE — Plan of Care (Signed)
  Problem: Education: Goal: Ability to describe self-care measures that may prevent or decrease complications (Diabetes Survival Skills Education) will improve Outcome: Progressing   Problem: Coping: Goal: Ability to adjust to condition or change in health will improve Outcome: Progressing   Problem: Education: Goal: Knowledge of General Education information will improve Description: Including pain rating scale, medication(s)/side effects and non-pharmacologic comfort measures Outcome: Progressing   Problem: Skin Integrity: Goal: Risk for impaired skin integrity will decrease Outcome: Progressing   Problem: Health Behavior/Discharge Planning: Goal: Ability to manage health-related needs will improve Outcome: Progressing   Problem: Activity: Goal: Risk for activity intolerance will decrease Outcome: Progressing   Problem: Coping: Goal: Level of anxiety will decrease Outcome: Progressing
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
## 2023-04-27 NOTE — Progress Notes (Signed)
 Mobility Specialist Progress Note:   04/27/23 1310  Mobility  Activity Ambulated with assistance in hallway  Level of Assistance Contact guard assist, steadying assist  Assistive Device Front wheel walker  Distance Ambulated (ft) 175 ft  Activity Response Tolerated well  Mobility Referral Yes  Mobility visit 1 Mobility  Mobility Specialist Start Time (ACUTE ONLY) 1245  Mobility Specialist Stop Time (ACUTE ONLY) 1300  Mobility Specialist Time Calculation (min) (ACUTE ONLY) 15 min   During Mobility: 122 HR Post Mobility: 100 HR  Pt received in bed, agreeable to mobility. Pt denied any discomfort during ambulation, asx throughout. HR peaked at 122 bpm during ambulation. Pt only needing CG throughout session. Pt returned to bed with call bell in reach and all needs met.   Leory Plowman  Mobility Specialist Please contact via [employer] office at [phone]
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
## 2023-04-28 DIAGNOSIS — R579 Shock, unspecified: Secondary | ICD-10-CM | POA: Diagnosis not present

## 2023-04-28 LAB — CBC WITH DIFFERENTIAL/PLATELET
Abs Immature Granulocytes: 0.03 10*3/uL (ref 0.00–0.07)
Basophils Absolute: 0 10*3/uL (ref 0.0–0.1)
Basophils Relative: 1 %
Eosinophils Absolute: 0.2 10*3/uL (ref 0.0–0.5)
Eosinophils Relative: 4 %
HCT: 40.9 % (ref 36.0–46.0)
Hemoglobin: 13.1 g/dL (ref 12.0–15.0)
Immature Granulocytes: 1 %
Lymphocytes Relative: 23 %
Lymphs Abs: 1.5 10*3/uL (ref 0.7–4.0)
MCH: 28.2 pg (ref 26.0–34.0)
MCHC: 32 g/dL (ref 30.0–36.0)
MCV: 88 fL (ref 80.0–100.0)
Monocytes Absolute: 0.4 10*3/uL (ref 0.1–1.0)
Monocytes Relative: 7 %
Neutro Abs: 4 10*3/uL (ref 1.7–7.7)
Neutrophils Relative %: 64 %
Platelets: 250 10*3/uL (ref 150–400)
RBC: 4.65 MIL/uL (ref 3.87–5.11)
RDW: 14.5 % (ref 11.5–15.5)
WBC: 6.2 10*3/uL (ref 4.0–10.5)
nRBC: 0 % (ref 0.0–0.2)

## 2023-04-28 LAB — BASIC METABOLIC PANEL WITH GFR
Anion gap: 14 (ref 5–15)
BUN: 21 mg/dL — ABNORMAL HIGH (ref 6–20)
CO2: 24 mmol/L (ref 22–32)
Calcium: 10.2 mg/dL (ref 8.9–10.3)
Chloride: 102 mmol/L (ref 98–111)
Creatinine, Ser: 0.7 mg/dL (ref 0.44–1.00)
GFR, Estimated: >60 mL/min (ref >60)
Glucose, Bld: 113 mg/dL — ABNORMAL HIGH (ref 70–99)
Potassium: 4.2 mmol/L (ref 3.5–5.1)
Sodium: 140 mmol/L (ref 135–145)

## 2023-04-28 LAB — MAGNESIUM: Magnesium: 2.4 mg/dL (ref 1.7–2.4)

## 2023-04-28 LAB — GLUCOSE, CAPILLARY
Glucose-Capillary: 103 mg/dL — ABNORMAL HIGH (ref 70–99)
Glucose-Capillary: 111 mg/dL — ABNORMAL HIGH (ref 70–99)

## 2023-04-28 LAB — PHOSPHORUS: Phosphorus: 3.6 mg/dL (ref 2.5–4.6)

## 2023-04-28 MED ORDER — BACLOFEN 10 MG PO TABS: 10.0000 mg | ORAL_TABLET | Freq: Three times a day (TID) | ORAL | Status: DC

## 2023-04-28 MED ORDER — CYANOCOBALAMIN 500 MCG PO TABS: 500.0000 ug | ORAL_TABLET | Freq: Every day | ORAL | 1 refills | Status: AC

## 2023-04-28 MED ORDER — ACETAMINOPHEN 325 MG PO TABS: 650.0000 mg | ORAL_TABLET | ORAL | Status: DC | PRN

## 2023-04-28 MED ORDER — FOLIC ACID 1 MG PO TABS: 0.5000 mg | ORAL_TABLET | Freq: Every day | ORAL | Status: DC

## 2023-04-28 MED ORDER — POLYETHYLENE GLYCOL 3350 17 G PO PACK: 17.0000 g | PACK | Freq: Every day | ORAL | Status: DC | PRN

## 2023-04-28 MED ORDER — POLYETHYLENE GLYCOL 3350 17 G PO PACK: 17.0000 g | PACK | Freq: Every day | ORAL | 0 refills | Status: DC | PRN

## 2023-04-28 MED ORDER — ADULT MULTIVITAMIN W/MINERALS CH: 1.0000 | ORAL_TABLET | Freq: Every day | ORAL | Status: DC

## 2023-04-28 MED ORDER — DOCUSATE SODIUM 50 MG/5ML PO LIQD: 100.0000 mg | Freq: Two times a day (BID) | ORAL | Status: DC | PRN

## 2023-04-28 NOTE — Plan of Care (Signed)
  Problem: Metabolic: Goal: Ability to maintain appropriate glucose levels will improve Outcome: Progressing   Problem: Nutritional: Goal: Maintenance of adequate nutrition will improve Outcome: Progressing   Problem: Skin Integrity: Goal: Risk for impaired skin integrity will decrease Outcome: Progressing   Problem: Tissue Perfusion: Goal: Adequacy of tissue perfusion will improve Outcome: Progressing   Problem: Clinical Measurements: Goal: Cardiovascular complication will be avoided Outcome: Progressing   Problem: Clinical Measurements: Goal: Cardiovascular complication will be avoided Outcome: Progressing   Problem: Nutrition: Goal: Adequate nutrition will be maintained Outcome: Progressing   Problem: Coping: Goal: Level of anxiety will decrease Outcome: Progressing

## 2023-04-28 NOTE — TOC Progression Note (Signed)
 Transition of Care (TOC) - Progression Note    Kandee Keen with [facility] , aware patient discharging today. Nurse reports walker from home at bedside.    Patient Details  Name: Kristen [name] MRN: [id number] Date of Birth: 1965/12/17  Transition of Care [facility]) CM/SW Contact  China Deitrick, Adria Devon, RN Phone Number: 04/28/2023, 9:34 AM  Clinical Narrative:       Expected Discharge Plan: Home w Home Health Services Barriers to Discharge: Continued Medical Work up  Expected Discharge Plan and Services   Discharge Planning Services: CM Consult Post Acute Care Choice: Home Health Living arrangements for the past 2 months: Single Family Home Expected Discharge Date: 04/28/23                         HH Arranged: OT, PT, Speech Therapy           Social Determinants of Health (SDOH) Interventions SDOH Screenings   Food Insecurity: Food Insecurity Present (01/14/2023)  Housing: Low Risk  (01/14/2023)  Transportation Needs: No Transportation Needs (01/14/2023)  Utilities: Not At Risk (01/14/2023)  Alcohol Screen: Low Risk  (06/04/2021)  Depression (PHQ2-9): Medium Risk (08/28/2021)  Financial Resource Strain: Medium Risk (06/04/2021)  Physical Activity: Insufficiently Active (06/04/2021)  Social Connections: Moderately Integrated (06/04/2021)  Recent Concern: Social Connections - Moderately Isolated (03/31/2021)  Stress: No Stress Concern Present (06/04/2021)  Recent Concern: Stress - Stress Concern Present (03/31/2021)  Tobacco Use: Low Risk  (04/22/2023)    Readmission Risk Interventions     No data to display
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
## 2023-04-29 LAB — VOLATILES,BLD-ACETONE,ETHANOL,ISOPROP,METHANOL: Ethanol, blood: <0.01 g/dL (ref 0.000–0.010)

## 2023-04-29 LAB — VITAMIN B1: Vitamin B1 (Thiamine): 161.6 nmol/L (ref 66.5–200.0)

## 2023-05-03 ENCOUNTER — Ambulatory Visit (HOSPITAL_COMMUNITY)
Admission: RE | Admit: 2023-05-03 | Discharge: 2023-05-03 | Disposition: A | Discharge status: Home / Self Care | Source: Ambulatory Visit | Attending: Adult Health | Admitting: Adult Health

## 2023-05-03 DIAGNOSIS — C0680 Malignant neoplasm of overlapping sites of unspecified parts of mouth: Secondary | ICD-10-CM | POA: Insufficient documentation

## 2023-05-03 DIAGNOSIS — C01 Malignant neoplasm of base of tongue: Secondary | ICD-10-CM | POA: Insufficient documentation

## 2023-05-03 DIAGNOSIS — C760 Malignant neoplasm of head, face and neck: Secondary | ICD-10-CM | POA: Insufficient documentation

## 2023-05-03 DIAGNOSIS — E041 Nontoxic single thyroid nodule: Secondary | ICD-10-CM | POA: Diagnosis present

## 2023-05-05 ENCOUNTER — Telehealth: Payer: Self-pay | Admitting: Adult Health

## 2023-05-05 NOTE — Telephone Encounter (Signed)
 Patient scheduled appointments. Patient is aware of all appointment details.

## 2023-05-12 ENCOUNTER — Encounter: Payer: Self-pay | Admitting: Adult Health

## 2023-05-12 ENCOUNTER — Telehealth: Payer: Self-pay | Admitting: Internal Medicine

## 2023-05-12 ENCOUNTER — Inpatient Hospital Stay: Attending: Adult Health | Admitting: Adult Health

## 2023-05-12 VITALS — BP 128/84 | HR 84 | Temp 98.4°F | Resp 16 | Wt 195.3 lb

## 2023-05-12 DIAGNOSIS — C01 Malignant neoplasm of base of tongue: Secondary | ICD-10-CM | POA: Diagnosis present

## 2023-05-12 DIAGNOSIS — E079 Disorder of thyroid, unspecified: Secondary | ICD-10-CM | POA: Diagnosis not present

## 2023-05-12 DIAGNOSIS — C0680 Malignant neoplasm of overlapping sites of unspecified parts of mouth: Secondary | ICD-10-CM | POA: Diagnosis not present

## 2023-05-12 DIAGNOSIS — Z923 Personal history of irradiation: Secondary | ICD-10-CM | POA: Diagnosis not present

## 2023-05-12 MED ORDER — FERROUS SULFATE 325 (65 FE) MG PO TABS: 325.0000 mg | ORAL_TABLET | Freq: Every day | ORAL | 1 refills | Status: DC

## 2023-05-12 NOTE — Progress Notes (Signed)
 [facility] Cancer Follow up:    Kristen Berne, MD [address] 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 Caldwell Castles performed left hemiglossectomy, resection of base of tongue cancer, pharyngoplasty, left selective neck dissection, levels I and II, tracheotomy, and G tube placement. She has questionable margins that are technically negative.   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 14 lymph nodes from the left level II neck, 2 nodes from the left submandibular region, and 4 nodes from the submental region were negative for malignancy. Carcinoma was present focally at the lateral edge of the left posterior tongue, 4.6 cm in greatest extent. The lateral margin was sent separately as specimen B, which was negative for carcinoma. Perineural invasion was appreciated but lymphovascular invasion was not identified.   11/16/2016 - 01/01/2017 Radiation Therapy   Site/dose:   Base of tongue tumor bed/ 66 Gy in 33 fractions      CURRENT THERAPY:observation  INTERVAL  HISTORY:  Discussed the use of AI scribe software for clinical note transcription with the patient, who gave verbal consent to proceed.  Kristen [name] 58 y.o. female  presents for a follow-up visit after a recent hospitalization. She was found unresponsive at home due to a very low blood pressure and required IV fluids and medication to stabilize her condition. She experienced significant swelling due to fluid shifting into her tissues. She is currently seeing Dr. Augustin Bloch for follow-up care and recently had a urinalysis, which was clear.  The patient also reports ongoing issues with swallowing, which has improved somewhat. She has learned to eat small bites and breathe to prevent choking. She has also started processing her food, particularly chicken, to make it easier to eat. She has a history of radiation treatment and has been given a book on the subject by her doctor.  In addition, the patient has a thyroid  nodule that is being monitored with annual ultrasounds. She is due for a thyroid  hormone lab test before her visit in November. The patient also mentions a left-sided pain, but no further details are provided.   Patient Active Problem List   Diagnosis Date Noted   Acute encephalopathy 04/22/2023   Enteritis 01/12/2023   Generalized weakness 07/18/2022   Dehydration 07/18/2022   Tension headache 07/18/2022   Depression 07/18/2022   History of tongue cancer 07/18/2022   PEG tube malfunction (HCC) 02/28/2022   Protein-calorie malnutrition, severe 04/29/2021   Pseudoarthrosis of lumbar spine 04/28/2021   Fusion of spine of lumbar region 04/28/2021   Pseudarthrosis after fusion or arthrodesis 04/20/2021    Class: Chronic   Tenosynovitis, de Quervain 12/14/2020   Arthritis of carpometacarpal (  CMC) joint of left thumb 12/14/2020   Aortic atherosclerosis (HCC) 08/20/2020   Hyperkalemia 07/24/2020   Alcohol use 08/05/2019   Bilateral lower extremity edema 08/05/2019   Peripheral  edema 03/12/2019   Iron deficiency anemia 12/11/2018   Loosening of hardware in spine [facility])    Other spondylosis with radiculopathy, lumbar region    History of lumbar spinal fusion 11/06/2018   UTI (urinary tract infection) 05/15/2017   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   Malignant neoplasm of base of tongue (HCC) 10/29/2016   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   Headache associated with sexual activity 05/14/2016   Tongue lesion 05/14/2016   Thrombocytopenia (HCC) 01/22/2016   Chronic diarrhea 01/22/2016   Chest pain    Essential hypertension    Spinal stenosis of lumbar region 01/20/2016    Class: Chronic   DDD (degenerative disc disease), lumbar 01/20/2016    Class: Chronic   Spinal stenosis of lumbar region with neurogenic claudication 01/20/2016   Low back pain 12/22/2015   Hypersomnolence 10/13/2015   Muscle cramp 08/01/2015   GERD (gastroesophageal reflux disease) 07/09/2015   Anemia of chronic disease 01/08/2013    is allergic to sulfa antibiotics and tomato.  MEDICAL HISTORY: Past Medical History:  Diagnosis Date   Allergy    Anemia    Iron deficiency   Anxiety    Arthritis    back, left shoulder   Blood transfusion without reported diagnosis    Chicken pox    Depression    Diarrhea    takes Imodium  daily   GERD (gastroesophageal reflux disease)    H/O hiatal hernia    Headache(784.0)    History of kidney stones    1996ish   History of radiation therapy 11/16/16- 01/01/17   Base of Tongue/ 66 gy in 33 fractions/ Dose: 2 Gy   Hypertension    Iron deficiency anemia 12/11/2018   Migraine    none for 5 years (as of 01/13/16)   OSA (obstructive sleep apnea) 10/13/2015   unable to get cpap, plans to get one in 2018   Pneumonia    Restless legs    Scoliosis    Shingles 08/28/2013   Shortness of breath    with exertion   Sleep apnea    Tongue cancer (HCC)    tongue cancer    SURGICAL HISTORY: Past  Surgical History:  Procedure Laterality Date   BACK SURGERY  07/21/1978   CARPOMETACARPEL SUSPENSION PLASTY Left 01/21/2021   Procedure: Left Thumb Ligament Carpometacarpal Arthroplasty;  Surgeon: Marilyn Shropshire, MD;  Location: [facility] OR;  Service: Orthopedics;  Laterality: Left;   CHOLECYSTECTOMY N/A 08/15/2013   Procedure: LAPAROSCOPIC CHOLECYSTECTOMY WITH INTRAOPERATIVE CHOLANGIOGRAM;  Surgeon: Diantha Fossa, MD;  Location: [facility] OR;  Service: General;  Laterality: N/A;   COLONOSCOPY N/A 01/09/2013   Procedure: COLONOSCOPY;  Surgeon: Almeda Aris, MD;  Location: [facility] ENDOSCOPY;  Service: Endoscopy;  Laterality: N/A;   COLONOSCOPY  03/2018   DIRECT LARYNGOSCOPY  07/2016   Dr. Caldwell Castles [facility]   ESOPHAGOGASTRODUODENOSCOPY  03/2018   ESOPHAGOGASTRODUODENOSCOPY (EGD) WITH PROPOFOL  N/A 04/24/2019   Procedure: ESOPHAGOGASTRODUODENOSCOPY (EGD) WITH PROPOFOL ;  Surgeon: Kenney Peacemaker, MD;  Location: [facility] ENDOSCOPY;  Service: Endoscopy;  Laterality: N/A;   GASTROSTOMY TUBE PLACEMENT  09/27/2016   GIVENS CAPSULE STUDY N/A 04/24/2019   Procedure: GIVENS CAPSULE STUDY;  Surgeon: Kenney Peacemaker, MD;  Location: [facility] ENDOSCOPY;  Service: Endoscopy;  Laterality: N/A;   HERNIA REPAIR  Left 1981   IR PATIENT EVAL TECH 0-60 MINS  12/13/2016   IR PATIENT EVAL TECH 0-60 MINS  04/11/2017   IR PATIENT EVAL TECH 0-60 MINS  03/24/2018   IR PATIENT EVAL TECH 0-60 MINS  11/23/2018   IR PATIENT EVAL TECH 0-60 MINS  05/28/2019   IR PATIENT EVAL TECH 0-60 MINS  12/17/2022   IR REPLACE G-TUBE SIMPLE WO FLUORO  12/12/2017   IR REPLACE G-TUBE SIMPLE WO FLUORO  03/29/2018   IR REPLACE G-TUBE SIMPLE WO FLUORO  06/29/2018   IR REPLACE G-TUBE SIMPLE WO FLUORO  11/02/2018   IR REPLACE G-TUBE SIMPLE WO FLUORO  03/08/2019   IR REPLACE G-TUBE SIMPLE WO FLUORO  07/02/2019   IR REPLACE G-TUBE SIMPLE WO FLUORO  11/30/2019   IR REPLACE G-TUBE SIMPLE WO FLUORO  12/27/2019   IR REPLACE G-TUBE SIMPLE WO FLUORO  05/27/2020   IR REPLACE G-TUBE SIMPLE WO  FLUORO  07/09/2020   IR REPLACE G-TUBE SIMPLE WO FLUORO  10/23/2021   IR REPLACE G-TUBE SIMPLE WO FLUORO  08/19/2022   IR REPLC GASTRO/COLONIC TUBE PERCUT W/FLUORO  03/01/2022   MODIFIED RADICAL NECK DISSECTION Left 09/27/2016   Levels 1 & 2   PARTIAL GLOSSECTOMY Left 09/27/2016   Left hemi partial glossectomy   SPINE SURGERY  01/20/2016   fusion   TONSILLECTOMY     tracheotomy  09/27/2016   TUBAL LIGATION  06/1988    SOCIAL HISTORY: Social History   Socioeconomic History   Marital status: Widowed    Spouse name: Not on file   Number of children: 3   Years of education: 75   Highest education level: 12th grade  Occupational History   Occupation: Administrator, arts  Tobacco Use   Smoking status: Never   Smokeless tobacco: Never  Vaping Use   Vaping status: Never Used  Substance and Sexual Activity   Alcohol use: Yes    Alcohol/week: 7.0 standard drinks of alcohol    Types: 7 Cans of beer per week    Comment: occasionally   Drug use: No   Sexual activity: Yes    Birth control/protection: Surgical  Other Topics Concern   Not on file  Social History Narrative   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[employer], 3 children   Never smoker no drug use occasional alcohol averaging about 7 drinks a week   fun: Play with her grandkids, read   Social Drivers of Health   Financial Resource Strain: Medium Risk (06/04/2021)   Overall Financial Resource Strain (CARDIA)    Difficulty of Paying Living Expenses: Somewhat hard  Food Insecurity: Food Insecurity Present (01/14/2023)   Hunger Vital Sign    Worried About Running Out of Food in the Last Year: Often true    Ran Out of Food in the Last Year: Often true  Transportation Needs: No Transportation Needs (01/14/2023)   PRAPARE - Administrator, Civil Service (Medical): No    Lack of Transportation (Non-Medical): No  Physical Activity: Insufficiently Active (06/04/2021)   Exercise Vital Sign    Days of  Exercise per Week: 3 days    Minutes of Exercise per Session: 30 min  Stress: No Stress Concern Present (06/04/2021)   [employer] of Occupational Health - Occupational Stress Questionnaire    Feeling of Stress : Only a little  Recent Concern: Stress - Stress Concern Present (03/31/2021)   [employer] of Occupational Health - Occupational Stress Questionnaire  Feeling of Stress : To some extent  Social Connections: Moderately Integrated (06/04/2021)   Social Connection and Isolation Panel [NHANES]    Frequency of Communication with Friends and Family: More than three times a week    Frequency of Social Gatherings with Friends and Family: More than three times a week    Attends Religious Services: More than 4 times per year    Active Member of [employer] or Organizations: Yes    Attends Banker Meetings: More than 4 times per year    Marital Status: Widowed  Recent Concern: Social Connections - Moderately Isolated (03/31/2021)   Social Connection and Isolation Panel [NHANES]    Frequency of Communication with Friends and Family: More than three times a week    Frequency of Social Gatherings with Friends and Family: More than three times a week    Attends Religious Services: More than 4 times per year    Active Member of [employer] or Organizations: No    Attends Banker Meetings: Not on file    Marital Status: Widowed  Intimate Partner Violence: Not At Risk (01/14/2023)   Humiliation, Afraid, Rape, and Kick questionnaire    Fear of Current or Ex-Partner: No    Emotionally Abused: No    Physically Abused: No    Sexually Abused: No    FAMILY HISTORY: Family History  Problem Relation Age of Onset   Heart disease Father    Heart attack Father    Hypertension Mother    Arthritis Mother    Diabetes Maternal Grandmother    Diabetes Paternal Grandmother    Diabetes Maternal Uncle    Colon polyps Neg Hx    Colon cancer Neg Hx    Esophageal cancer Neg Hx     Stomach cancer Neg Hx    Rectal cancer Neg Hx     Review of Systems  Constitutional:  Negative for appetite change, chills, fatigue, fever and unexpected weight change.  HENT:   Negative for hearing loss, lump/mass and trouble swallowing.   Eyes:  Negative for eye problems and icterus.  Respiratory:  Negative for chest tightness, cough and shortness of breath.   Cardiovascular:  Negative for chest pain, leg swelling and palpitations.  Gastrointestinal:  Negative for abdominal distention, abdominal pain, constipation, diarrhea, nausea and vomiting.  Endocrine: Negative for hot flashes.  Genitourinary:  Negative for difficulty urinating.   Musculoskeletal:  Negative for arthralgias.  Skin:  Negative for itching and rash.  Neurological:  Negative for dizziness, extremity weakness, headaches and numbness.  Hematological:  Negative for adenopathy. Does not bruise/bleed easily.  Psychiatric/Behavioral:  Negative for depression. The patient is not nervous/anxious.       PHYSICAL EXAMINATION    Vitals:   05/12/23 1026 05/12/23 1029  BP: (!) 142/87 128/84  Pulse: 84   Resp: 16   Temp: 98.4 F (36.9 C)   SpO2: 99%     Physical Exam Constitutional:      General: She is not in acute distress.    Appearance: Normal appearance. She is not toxic-appearing.  HENT:     Head: Normocephalic and atraumatic.     Mouth/Throat:     Mouth: Mucous membranes are moist.     Pharynx: Oropharynx is clear. No oropharyngeal exudate or posterior oropharyngeal erythema.  Eyes:     General: No scleral icterus. Cardiovascular:     Rate and Rhythm: Normal rate and regular rhythm.     Pulses: Normal pulses.  Heart sounds: Normal heart sounds.  Pulmonary:     Effort: Pulmonary effort is normal.     Breath sounds: Normal breath sounds.  Abdominal:     General: Abdomen is flat. Bowel sounds are normal. There is no distension.     Palpations: Abdomen is soft.     Tenderness: There is no abdominal  tenderness.  Musculoskeletal:        General: No swelling.     Cervical back: Neck supple.  Lymphadenopathy:     Cervical: No cervical adenopathy.  Skin:    General: Skin is warm and dry.     Findings: No rash.  Neurological:     General: No focal deficit present.     Mental Status: She is alert.  Psychiatric:        Mood and Affect: Mood normal.        Behavior: Behavior normal.     LABORATORY DATA:  CBC    Component Value Date/Time   WBC 6.2 04/28/2023 0436   RBC 4.65 04/28/2023 0436   HGB 13.1 04/28/2023 0436   HGB 12.1 12/23/2016 1303   HCT 40.9 04/28/2023 0436   HCT 38.6 12/23/2016 1303   PLT 250 04/28/2023 0436   PLT 208 12/23/2016 1303   MCV 88.0 04/28/2023 0436   MCV 86.2 12/23/2016 1303   MCH 28.2 04/28/2023 0436   MCHC 32.0 04/28/2023 0436   RDW 14.5 04/28/2023 0436   RDW 15.4 (H) 12/23/2016 1303   LYMPHSABS 1.5 04/28/2023 0436   LYMPHSABS 0.5 (L) 12/23/2016 1303   MONOABS 0.4 04/28/2023 0436   MONOABS 0.5 12/23/2016 1303   EOSABS 0.2 04/28/2023 0436   EOSABS 0.2 12/23/2016 1303   BASOSABS 0.0 04/28/2023 0436   BASOSABS 0.0 12/23/2016 1303    CMP     Component Value Date/Time   NA 140 04/28/2023 0436   NA 144 01/14/2017 1035   K 4.2 04/28/2023 0436   K 4.2 01/14/2017 1035   CL 102 04/28/2023 0436   CO2 24 04/28/2023 0436   CO2 28 01/14/2017 1035   GLUCOSE 113 (H) 04/28/2023 0436   GLUCOSE 101 01/14/2017 1035   BUN 21 (H) 04/28/2023 0436   BUN 9.4 01/14/2017 1035   CREATININE 0.70 04/28/2023 0436   CREATININE 0.70 07/30/2019 1406   CREATININE 0.8 01/14/2017 1035   CALCIUM  10.2 04/28/2023 0436   CALCIUM  9.2 01/14/2017 1035   PROT 5.5 (L) 04/22/2023 0900   ALBUMIN  3.0 (L) 04/22/2023 0900   AST 20 04/22/2023 0900   ALT 11 04/22/2023 0900   ALKPHOS 65 04/22/2023 0900   BILITOT 1.0 04/22/2023 0900   GFRNONAA >60 04/28/2023 0436   GFRNONAA >60 05/13/2017 1223   GFRAA >60 11/09/2018 0434   GFRAA >60 05/13/2017 1223     ASSESSMENT and  THERAPY PLAN:   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   Thyroid  nodule Thyroid  nodule identified. Recent ultrasound and thyroid  hormone levels normal. No biopsy needed currently. Radiologists to monitor for changes. - Order annual thyroid  ultrasound. - Order thyroid  hormone lab test before November visit. - Provided information about thyroid  nodules.  Dysphagia Dysphagia with improved symptoms using smaller bites and breathing techniques. Involuntary throat closure noted. - Provided information on managing dysphagia.     All questions were answered. The patient  knows to call the clinic with any problems, questions or concerns. We can certainly see the patient much sooner if necessary.  Total encounter time:30 minutes*in face-to-face visit time, chart review, lab review, care coordination, order entry, and documentation of the encounter time.    Alwin Baars, NP 05/15/23 10:22 PM Medical Oncology and Hematology [facility] [address] [place], [place] [address] Tel. [phone]    Fax. [phone]  *Total Encounter Time as defined by the Centers for Medicare and Medicaid Services includes, in addition to the face-to-face time of a patient visit (documented in the note above) non-face-to-face time: obtaining and reviewing outside history, ordering and reviewing medications, tests or procedures, care coordination (communications with other health care professionals or caregivers) and documentation in the medical record.

## 2023-05-12 NOTE — Patient Instructions (Signed)
 Thyroid Nodule  A thyroid nodule is an isolated growth of thyroid cells that forms a lump in the thyroid gland. The thyroid gland is a butterfly-shaped gland found in the lower front of the neck. It sends chemical messengers (hormones) through the blood to all parts of the body. These hormones are important in regulating body temperature and helping the body use energy. Thyroid nodules are common. Most are not cancerous (are benign). You may have one nodule or several nodules. There are different types of thyroid nodules. They include nodules that: Grow and fill with fluid (thyroid cysts). Produce too much thyroid hormone (hot nodules or hyperthyroid). Produce no thyroid hormone (cold nodules or hypothyroid). Form from cancer cells (thyroid cancers). What are the causes? In most cases, the cause of thyroid nodules is not known. What increases the risk? The following factors may make you more likely to develop thyroid nodules: Age. Thyroid nodules are more common in people who are older than 45 years. Female gender. A family history that includes: Thyroid nodules. Pheochromocytoma. Thyroid carcinoma. Hyperparathyroidism. Certain thyroid diseases, such as Hashimoto's thyroiditis. Lack of iodine in your diet. A history of head and neck radiation, such as from cancer treatments. Type 2 diabetes. What are the signs or symptoms? In many cases, there are no symptoms. If you have symptoms, they may include: A lump in your lower neck. Feeling pressure, fullness, or a tickle in your throat. Pain in your neck, jaw, or ear. Having trouble swallowing or breathing. Hot nodules may cause: Weight loss. Warm, flushed skin. Feeling hot. Feeling nervous. A rapid or irregular heartbeat. Cold nodules may cause: Weight gain. Dry skin. Hair loss, brittle hair, or both. Feeling cold. Fatigue. Thyroid cancer nodules may cause: Hard nodules that can be felt along the thyroid  gland. Hoarseness. Lumps in the tissue (lymph nodes) near your thyroid gland. How is this diagnosed? A thyroid nodule may be felt by your health care provider during a physical exam. This condition may also be diagnosed based on your symptoms. You may also have tests, including: Blood tests to check how well your thyroid is working. An ultrasound. This may be done to confirm the diagnosis. A biopsy. This involves taking a sample from the nodule and looking at it under a microscope. A thyroid scan. This test creates an image of the thyroid gland using a radioactive tracer. Imaging tests such as an MRI or CT scan. These may be done if: A nodule is large. A nodule is blocking your airway. Cancer is suspected. How is this treated? Treatment depends on the cause and size of your nodule or nodules. If a nodule is benign, treatment may not be necessary. Your health care provider may monitor the nodule to see if it goes away without treatment. If a nodule continues to grow, is cancerous, or does not go away, treatment may be needed. Treatment may include: Having a cystic nodule drained with a needle. Ablation therapy. In this treatment, alcohol is injected into the area of the nodule to destroy the cells. Ablation with heat may also be used. This is called thermal ablation. Radioactive iodine. In this treatment, radioactive iodine is given as a pill or liquid that you drink. This substance causes the thyroid nodule to shrink. Surgery to remove the nodule or nodules. Part or all of your thyroid gland may also need to be removed. Medicines to treat hyperthyroidism. Follow these instructions at home: Pay attention to any changes in your thyroid nodule or nodules. Take over-the-counter  and prescription medicines only as told by your health care provider. Keep all follow-up visits. This is important. Contact a health care provider if: You have trouble sleeping. You have muscle weakness. You have  significant weight loss without changing your eating habits. You feel nervous. You have trouble swallowing. You have increased swelling. You have a rapid or irregular heartbeat. Get help right away if: You have chest pain. You faint or lose consciousness. Your nodule makes it hard for you to breathe. These symptoms may be an emergency. Get help right away. Call 911. Do not wait to see if the symptoms will go away. Do not drive yourself to the hospital. Summary A thyroid nodule is an isolated growth of thyroid cells that forms a lump in your thyroid gland. Thyroid nodules are common. Most are not cancerous. Your health care provider may monitor the nodule to see if it goes away without treatment. If a nodule continues to grow, is cancerous, or does not go away, treatment may be needed. Treatment depends on the cause and size of your nodule or nodules. This information is not intended to replace advice given to you by your health care provider. Make sure you discuss any questions you have with your health care provider. Document Revised: 11/17/2020 Document Reviewed: 11/17/2020 Elsevier Patient Education  2024 [employer].

## 2023-05-12 NOTE — Telephone Encounter (Signed)
 Inbound call from patients pharmacy requesting refill for Ferrous sulfate . Please advise.

## 2023-05-12 NOTE — Telephone Encounter (Signed)
 Ferrous Sulfate  refilled as pharmacy requested.

## 2023-05-13 ENCOUNTER — Other Ambulatory Visit: Payer: Self-pay | Admitting: Internal Medicine

## 2023-05-13 ENCOUNTER — Telehealth: Payer: Self-pay | Admitting: Adult Health

## 2023-05-13 ENCOUNTER — Inpatient Hospital Stay

## 2023-05-13 NOTE — Telephone Encounter (Signed)
 Confirmed with pt about added appt.

## 2023-05-15 NOTE — Assessment & Plan Note (Signed)
 Kristen [name] is a 58 year old woman with h/o head and neck cancer s/p surgery and radiation here today for long term follow up of her cancer and thyroid  nodule evaluated on recent ultrasound.   Thyroid  nodule Thyroid  nodule identified. Recent ultrasound and thyroid  hormone levels normal. No biopsy needed currently. Radiologists to monitor for changes. - Order annual thyroid  ultrasound. - Order thyroid  hormone lab test before November visit. - Provided information about thyroid  nodules.  Dysphagia Dysphagia with improved symptoms using smaller bites and breathing techniques. Involuntary throat closure noted. - Provided information on managing dysphagia.

## 2023-05-17 ENCOUNTER — Other Ambulatory Visit: Payer: Self-pay

## 2023-05-17 MED ORDER — FERROUS SULFATE 220 (44 FE) MG/5ML PO SOLN: ORAL | 6 refills | Status: DC

## 2023-05-18 ENCOUNTER — Other Ambulatory Visit: Payer: Self-pay | Admitting: Orthopedic Surgery

## 2023-05-26 ENCOUNTER — Other Ambulatory Visit: Payer: Self-pay | Admitting: Internal Medicine

## 2023-05-30 NOTE — Telephone Encounter (Signed)
 I refilled x 6 months She does need to be seen by me or an APP before more refills so let's schedule that follow-up now

## 2023-05-30 NOTE — Telephone Encounter (Signed)
 I have made her an appointment with Loa Riling, PA on 07/26/2023 at 9:10AM and she is aware of date/time.

## 2023-07-12 ENCOUNTER — Other Ambulatory Visit (HOSPITAL_COMMUNITY): Payer: Self-pay | Admitting: Radiology

## 2023-07-12 DIAGNOSIS — R633 Feeding difficulties, unspecified: Secondary | ICD-10-CM

## 2023-07-13 ENCOUNTER — Other Ambulatory Visit (HOSPITAL_COMMUNITY): Payer: Self-pay | Admitting: Radiology

## 2023-07-13 ENCOUNTER — Ambulatory Visit (HOSPITAL_COMMUNITY)
Admission: RE | Admit: 2023-07-13 | Discharge: 2023-07-13 | Disposition: A | Discharge status: Home / Self Care | Source: Ambulatory Visit | Attending: Radiology | Admitting: Radiology

## 2023-07-13 DIAGNOSIS — R633 Feeding difficulties, unspecified: Secondary | ICD-10-CM

## 2023-07-13 DIAGNOSIS — K9423 Gastrostomy malfunction: Secondary | ICD-10-CM | POA: Diagnosis present

## 2023-07-13 HISTORY — PX: IR REPLACE G-TUBE SIMPLE WO FLUORO: IMG2323

## 2023-07-13 MED ORDER — SILVER NITRATE-POT NITRATE 75-25 % EX MISC: 1.0000 | Freq: Once | CUTANEOUS | Status: DC

## 2023-07-13 MED ORDER — SILVER NITRATE-POT NITRATE 75-25 % EX MISC
CUTANEOUS | Status: AC
  Filled 2023-07-13: qty 10

## 2023-07-13 MED ORDER — LIDOCAINE VISCOUS HCL 2 % MT SOLN
OROMUCOSAL | Status: AC
Start: 2023-07-13 — End: 2023-07-13
  Filled 2023-07-13: qty 15

## 2023-07-13 NOTE — Procedures (Signed)
 Patient's existing 24 French balloon retention gastrostomy tube was exchanged out for a new 18 French balloon retention gastrostomy tube due to evidence of leaking/hole in tubing.  9 cc saline was instilled into the balloon.  Silver  nitrate was applied to granulation tissue at tube insertion site.  Tube was flushed without difficulty and outer disc was made taut to skin surface.  No immediate complications.EBL none.

## 2023-07-14 ENCOUNTER — Ambulatory Visit: Admitting: Orthopedic Surgery

## 2023-07-14 ENCOUNTER — Other Ambulatory Visit (INDEPENDENT_AMBULATORY_CARE_PROVIDER_SITE_OTHER): Payer: Self-pay

## 2023-07-14 DIAGNOSIS — M5412 Radiculopathy, cervical region: Secondary | ICD-10-CM | POA: Diagnosis not present

## 2023-07-14 MED ORDER — METHYLPREDNISOLONE 4 MG PO TBPK
ORAL_TABLET | ORAL | 0 refills | Status: DC
Start: 2023-07-14 — End: 2023-08-18

## 2023-07-14 NOTE — Progress Notes (Signed)
 Orthopedic Spine Surgery Office Note   Assessment: Patient is a 58 y.o. female whose lumbar spine is doing well with minimal pain, but has noted recent onset of bilateral shoulder pain - possible radiculopathy     Plan: -Patient has tried naproxen , tylenol , activity modification, medrol  dose pak, diclofenac  -Recommended a medrol  dose pak which was prescribed to her today. Referred her to PT as well -If she is not doing an better at our next visit, will order a MRI of the cervical spine to evaluate for radiculopathy -Activity as tolerated, no spine specific precautions -Patient will next be seen 6 weeks, x-rays at next visit: none     Patient expressed understanding of the plan and all questions were answered to the patient's satisfaction.    ___________________________________________________________________________     History: Patient is a 58 y.o. female who presents today for follow-up on her lumbar spine and cervical spine.  In regards to her lumbar spine, she has been doing well.  She is not having any significant pain in her low back.  No pain radiating to her lower extremities.  She also wanted talk about her bilateral shoulder pain.  She has had this now for about 2 months.  There is no trauma or injury that preceded the onset of the pain.  She feels the pain is worse in the left shoulder.  She notices the pain in the lateral aspect of the shoulder and into the lateral arm.  When it is particular that radiates into the left dorsal forearm.  On the right side, she feels that in the shoulder and in the lateral proximal arm.  It does not radiate past that point.  She said she sometimes gets numbness along the lateral aspect of the arm on the left side.  No other numbness or paresthesias.  She notices the pain with activity and at rest.  Has not noticed any issues with fine motor skills in the hand.  No bowel or bladder incontinence.  No saddle anesthesia.  Has chronically felt unsteady but no  recent changes. Does not need any ambulatory assistive devices.    Treatments tried: naproxen , tylenol , activity modification, medrol  dose pak, diclofenac      Physical Exam:   General: no acute distress, appears stated age Neurologic: alert, answering questions appropriately, following commands Respiratory: unlabored breathing on room air, symmetric chest rise Psychiatric: appropriate affect, normal cadence to speech     MSK (spine):   -Strength exam      Left  Right Grip strength                5/5  5/5 Interosseus   5/5   5/5 Wrist extension  5/5  5/5 Wrist flexion   5/5  5/5 Elbow flexion   5/5  5/5 Deltoid    5/5  5/5  EHL    5/5  5/5 TA    5/5  5/5 GSC    5/5  5/5 Knee extension  5/5  5/5 Hip flexion   5/5  5/5  -Sensory exam    Sensation intact to light touch in L3-S1 nerve distributions of bilateral lower extremities  Sensation intact to light touch in C5-T1 nerve distributions of bilateral upper extremities  -Brachioradialis DTR: 1/4 on the left, 1/4 on the right -Biceps DTR: 1/4 on the left, 1/4 on the right  -Spurling: negative bilaterally -Hoffman sign: negative bilaterally  -Clonus: no beats bilaterally  -Interosseous wasting: none seen -Grip and release test: negative -Romberg: negative  -Gait:  normal   -Bilateral shoulder exam: No tenderness to palpation over the shoulder, pain with external rotation past 80 degrees, no pain to remainder of range of motion, pain but no weakness with Jobe, negative belly press, no weakness with external rotation with arm at side     Imaging: XRs of the lumbar spine from 03/08/2023 were previously independently reviewed and interpreted, showing scoliosis with apex to the left at T12.  Chronically broken or cut rod seen at the lower thoracic spine on the right side.  Posterior instrumentation from L1-S1.  No lucency seen around the posterior fixation.  Interbody devices seen at L3/4, L4/5, and L5/S1.  No lucency seen  around the interbody devices.  Devices appear in appropriate position.  No fracture or dislocation seen.  No evidence of instability on flexion/extension views.  XRs of the cervical spine from 07/14/2023 were independently reviewed and interpreted, showing disc height loss at C5/6 with anterior osteophyte formation. No other significant degenerative changes. Surgical staples along the left side of the neck anterior to the cervical spine.  No evidence of instability on flexion/extension views.  No fracture dislocation seen.     Patient name: Kristen [name] Patient MRN: [id number] Date of visit: 07/14/23

## 2023-07-26 ENCOUNTER — Ambulatory Visit: Admitting: Gastroenterology

## 2023-08-05 ENCOUNTER — Encounter: Payer: Self-pay | Admitting: Physical Therapy

## 2023-08-05 ENCOUNTER — Ambulatory Visit: Admitting: Physical Therapy

## 2023-08-05 ENCOUNTER — Other Ambulatory Visit: Payer: Self-pay

## 2023-08-05 DIAGNOSIS — M25511 Pain in right shoulder: Secondary | ICD-10-CM | POA: Diagnosis not present

## 2023-08-05 DIAGNOSIS — M6281 Muscle weakness (generalized): Secondary | ICD-10-CM | POA: Diagnosis not present

## 2023-08-05 DIAGNOSIS — R293 Abnormal posture: Secondary | ICD-10-CM

## 2023-08-05 DIAGNOSIS — R29898 Other symptoms and signs involving the musculoskeletal system: Secondary | ICD-10-CM

## 2023-08-05 DIAGNOSIS — M542 Cervicalgia: Secondary | ICD-10-CM | POA: Diagnosis not present

## 2023-08-05 DIAGNOSIS — M25512 Pain in left shoulder: Secondary | ICD-10-CM

## 2023-08-05 NOTE — Therapy (Signed)
 OUTPATIENT PHYSICAL THERAPY CERVICAL EVALUATION   Patient Name: Kristen [name] MRN: [id number] DOB:November 09, 1965, 58 y.o., female Today's Date: 08/05/2023  END OF SESSION:  PT End of Session - 08/05/23 1050     Visit Number 1    Number of Visits 17    Date for PT Re-Evaluation 09/30/23    Authorization Type [employer]    Authorization Time Period 08/05/23 to 09/30/23    Progress Note Due on Visit 10    PT Start Time 1019    PT Stop Time 1058    PT Time Calculation (min) 39 min    Activity Tolerance Patient tolerated treatment well    Behavior During Therapy WFL for tasks assessed/performed          Past Medical History:  Diagnosis Date   Allergy    Anemia    Iron deficiency   Anxiety    Arthritis    back, left shoulder   Blood transfusion without reported diagnosis    Chicken pox    Depression    Diarrhea    takes Imodium  daily   GERD (gastroesophageal reflux disease)    H/O hiatal hernia    Headache(784.0)    History of kidney stones    1996ish   History of radiation therapy 11/16/16- 01/01/17   Base of Tongue/ 66 gy in 33 fractions/ Dose: 2 Gy   Hypertension    Iron deficiency anemia 12/11/2018   Migraine    none for 5 years (as of 01/13/16)   OSA (obstructive sleep apnea) 10/13/2015   unable to get cpap, plans to get one in 2018   Pneumonia    Restless legs    Scoliosis    Shingles 08/28/2013   Shortness of breath    with exertion   Sleep apnea    Tongue cancer (HCC)    tongue cancer   Past Surgical History:  Procedure Laterality Date   BACK SURGERY  07/21/1978   CARPOMETACARPEL SUSPENSION PLASTY Left 01/21/2021   Procedure: Left Thumb Ligament Carpometacarpal Arthroplasty;  Surgeon: Romona Harari, MD;  Location: [facility] OR;  Service: Orthopedics;  Laterality: Left;   CHOLECYSTECTOMY N/A 08/15/2013   Procedure: LAPAROSCOPIC CHOLECYSTECTOMY WITH INTRAOPERATIVE CHOLANGIOGRAM;  Surgeon: Lynwood MALVA Pina, MD;  Location: [facility] OR;  Service: General;  Laterality: N/A;    COLONOSCOPY N/A 01/09/2013   Procedure: COLONOSCOPY;  Surgeon: Belvie JONETTA Just, MD;  Location: [facility] ENDOSCOPY;  Service: Endoscopy;  Laterality: N/A;   COLONOSCOPY  03/2018   DIRECT LARYNGOSCOPY  07/2016   [facility]   ESOPHAGOGASTRODUODENOSCOPY  03/2018   ESOPHAGOGASTRODUODENOSCOPY (EGD) WITH PROPOFOL  N/A 04/24/2019   Procedure: ESOPHAGOGASTRODUODENOSCOPY (EGD) WITH PROPOFOL ;  Surgeon: Avram Lupita BRAVO, MD;  Location: [facility] ENDOSCOPY;  Service: Endoscopy;  Laterality: N/A;   GASTROSTOMY TUBE PLACEMENT  09/27/2016   GIVENS CAPSULE STUDY N/A 04/24/2019   Procedure: GIVENS CAPSULE STUDY;  Surgeon: Avram Lupita BRAVO, MD;  Location: [facility] ENDOSCOPY;  Service: Endoscopy;  Laterality: N/A;   HERNIA REPAIR Left 1981   IR PATIENT EVAL TECH 0-60 MINS  12/13/2016   IR PATIENT EVAL TECH 0-60 MINS  04/11/2017   IR PATIENT EVAL TECH 0-60 MINS  03/24/2018   IR PATIENT EVAL TECH 0-60 MINS  11/23/2018   IR PATIENT EVAL TECH 0-60 MINS  05/28/2019   IR PATIENT EVAL TECH 0-60 MINS  12/17/2022   IR REPLACE G-TUBE SIMPLE WO FLUORO  12/12/2017   IR REPLACE G-TUBE SIMPLE WO FLUORO  03/29/2018   IR REPLACE G-TUBE SIMPLE WO  FLUORO  06/29/2018   IR REPLACE G-TUBE SIMPLE WO FLUORO  11/02/2018   IR REPLACE G-TUBE SIMPLE WO FLUORO  03/08/2019   IR REPLACE G-TUBE SIMPLE WO FLUORO  07/02/2019   IR REPLACE G-TUBE SIMPLE WO FLUORO  11/30/2019   IR REPLACE G-TUBE SIMPLE WO FLUORO  12/27/2019   IR REPLACE G-TUBE SIMPLE WO FLUORO  05/27/2020   IR REPLACE G-TUBE SIMPLE WO FLUORO  07/09/2020   IR REPLACE G-TUBE SIMPLE WO FLUORO  10/23/2021   IR REPLACE G-TUBE SIMPLE WO FLUORO  08/19/2022   IR REPLACE G-TUBE SIMPLE WO FLUORO  07/13/2023   IR REPLC GASTRO/COLONIC TUBE PERCUT W/FLUORO  03/01/2022   MODIFIED RADICAL NECK DISSECTION Left 09/27/2016   Levels 1 & 2   PARTIAL GLOSSECTOMY Left 09/27/2016   Left hemi partial glossectomy   SPINE SURGERY  01/20/2016   fusion   TONSILLECTOMY     tracheotomy  09/27/2016   TUBAL LIGATION  06/1988    Patient Active Problem List   Diagnosis Date Noted   Acute encephalopathy 04/22/2023   Enteritis 01/12/2023   Generalized weakness 07/18/2022   Dehydration 07/18/2022   Tension headache 07/18/2022   Depression 07/18/2022   History of tongue cancer 07/18/2022   PEG tube malfunction (HCC) 02/28/2022   Protein-calorie malnutrition, severe 04/29/2021   Pseudoarthrosis of lumbar spine 04/28/2021   Fusion of spine of lumbar region 04/28/2021   Pseudarthrosis after fusion or arthrodesis 04/20/2021    Class: Chronic   Tenosynovitis, de Quervain 12/14/2020   Arthritis of carpometacarpal (CMC) joint of left thumb 12/14/2020   Aortic atherosclerosis (HCC) 08/20/2020   Hyperkalemia 07/24/2020   Alcohol use 08/05/2019   Bilateral lower extremity edema 08/05/2019   Peripheral edema 03/12/2019   Iron deficiency anemia 12/11/2018   Loosening of hardware in spine (HCC)    Other spondylosis with radiculopathy, lumbar region    History of lumbar spinal fusion 11/06/2018   UTI (urinary tract infection) 05/15/2017   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   Malignant neoplasm of base of tongue (HCC) 10/29/2016   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   Headache associated with sexual activity 05/14/2016   Tongue lesion 05/14/2016   Thrombocytopenia (HCC) 01/22/2016   Chronic diarrhea 01/22/2016   Chest pain    Essential hypertension    Spinal stenosis of lumbar region 01/20/2016    Class: Chronic   DDD (degenerative disc disease), lumbar 01/20/2016    Class: Chronic   Spinal stenosis of lumbar region with neurogenic claudication 01/20/2016   Low back pain 12/22/2015   Hypersomnolence 10/13/2015   Muscle cramp 08/01/2015   GERD (gastroesophageal reflux disease) 07/09/2015   Anemia of chronic disease 01/08/2013    PCP: Sigrid Deidra Fox MD   REFERRING PROVIDER: Georgina Ozell LABOR, MD  REFERRING DIAG: Diagnosis M54.12 (ICD-10-CM) - Radiculopathy,  cervical region  THERAPY DIAG:  Cervicalgia  Muscle weakness (generalized)  Abnormal posture  Bilateral shoulder pain, unspecified chronicity  Other symptoms and signs involving the musculoskeletal system  Rationale for Evaluation and Treatment: Rehabilitation  ONSET DATE: about 2-3 months ago   SUBJECTIVE:  SUBJECTIVE STATEMENT:  Usually my pain starts when I first get up in the morning, especially L shoulder but sometimes it can go down to both hands. Sometimes it eases off, sometimes it doesn't. Sometimes laying down and getting mind off of it can help, sometimes when it hurts there's no getting away from it. Holding 30# grand child can be difficult with LUE, have to switch sides a lot. Steroids helped. When my arm hurts, my whole body gets off including my BP  Hand dominance: Right  PERTINENT HISTORY:  See above   PAIN:  Are you having pain? Yes: NPRS scale: 3/10 Pain location: mostly in neck, not as much in L shoulder today  Pain description: tightness, sharp throbbing pains  Aggravating factors: not sure  Relieving factors: unclear, sometimes laying down helps, sometimes nothing helps   PRECAUTIONS: Activity as tolerated, no spine specific precautions per MD note; still has G-tube   RED FLAGS: None     WEIGHT BEARING RESTRICTIONS: No  FALLS:  Has patient fallen in last 6 months? Yes. Number of falls 2- was sick at the time, sometimes feels off balance now but no falls when healthy   LIVING ENVIRONMENT: Lives with: lives with their family Lives in: House/apartment   OCCUPATION: not working/on disability   PLOF: Independent, Independent with basic ADLs, Independent with gait, and Independent with transfers  PATIENT GOALS: address pain in neck and shoulders, avoid  surgery   NEXT MD VISIT: Referring 08/25/23  OBJECTIVE:  Note: Objective measures were completed at Evaluation unless otherwise noted.  DIAGNOSTIC FINDINGS:  Imaging: XRs of the lumbar spine from 03/08/2023 were previously independently reviewed and interpreted, showing scoliosis with apex to the left at T12.  Chronically broken or cut rod seen at the lower thoracic spine on the right side.  Posterior instrumentation from L1-S1.  No lucency seen around the posterior fixation.  Interbody devices seen at L3/4, L4/5, and L5/S1.  No lucency seen around the interbody devices.  Devices appear in appropriate position.  No fracture or dislocation seen.  No evidence of instability on flexion/extension views.   XRs of the cervical spine from 07/14/2023 were independently reviewed and interpreted, showing disc height loss at C5/6 with anterior osteophyte formation. No other significant degenerative changes. Surgical staples along the left side of the neck anterior to the cervical spine.  No evidence of instability on flexion/extension views.  No fracture dislocation seen.  PATIENT SURVEYS:   THE PATIENT SPECIFIC FUNCTIONAL SCALE  Place score of 0-10 (0 = unable to perform activity and 10 = able to perform activity at the same level as before injury or problem)  Activity Date: 08/05/23 Eval     Laying on L side  5    2. Lifting >10# 5    3. Holding grandchild  5    4.      Total Score 5      Total Score = Sum of activity scores/number of activities  Minimally Detectable Change: 3 points (for single activity); 2 points (for average score)  [facility] (nd). The Patient Specific Functional Scale . Retrieved from [email or web address]   COGNITION: Overall cognitive status: Within functional limits for tasks assessed  SENSATION: Intermittent numbness in arms/hands sometimes, not at time of eval   POSTURE: rounded shoulders, forward  head, decreased lumbar lordosis, and decreased thoracic kyphosis  PALPATION:  very tight and tender B upper traps (more notable trigger points and spasms noted L UT), R thoracic paraspinals very tender  but L OK      CERVICAL ROM:   Active ROM A/PROM (deg) eval  Flexion 25% limited   Extension 40% limited   Right lateral flexion 50% limited more painful   Left lateral flexion 50% limited not as bad as right   Right rotation 25% limited   Left rotation 50% limited    (Blank rows = not tested)    Thoracic AROM: flexion 75% limited, extension extreme limitation/barely mobile; lateral flexion very limited, compensates from lumbar side bending; rotation 50% limited B   UPPER EXTREMITY ROM:  Active ROM Right eval Left eval  Shoulder flexion 90* 80*  Shoulder extension    Shoulder abduction 100* 100*  Shoulder adduction    Shoulder extension    Shoulder internal rotation T12  T12   Shoulder external rotation C7  Upper trap   Elbow flexion    Elbow extension    Wrist flexion    Wrist extension    Wrist ulnar deviation    Wrist radial deviation    Wrist pronation    Wrist supination     (Blank rows = not tested)  UPPER EXTREMITY MMT:  MMT Right eval Left eval  Shoulder flexion 4- 3-  Shoulder extension    Shoulder abduction 3+ 3-  Shoulder adduction    Shoulder extension    Shoulder internal rotation 4- 3+  Shoulder external rotation 4- 3+  Middle trapezius    Lower trapezius    Elbow flexion    Elbow extension    Wrist flexion    Wrist extension    Wrist ulnar deviation    Wrist radial deviation    Wrist pronation    Wrist supination    Grip strength     (Blank rows = not tested)    TREATMENT DATE:    08/05/23  Eval, POC, HEP practice and education as below                                                                                                                                    PATIENT EDUCATION:  Education details: exam findings, POC,  HEP  Person educated: Patient Education method: Programmer, multimedia, Demonstration, and Handouts Education comprehension: verbalized understanding, returned demonstration, and needs further education  HOME EXERCISE PROGRAM: Access Code: [email or web address] URL: [email or web address] Date: 08/05/2023 Prepared by: Josette Rough  Exercises - Seated Upper Trapezius Stretch  - 2 x daily - 7 x weekly - 1 sets - 6 reps - 30 seconds  hold - Gentle Levator Scapulae Stretch  - 2 x daily - 7 x weekly - 1 sets - 6 reps - Seated Scapular Retraction  - 2 x daily - 7 x weekly - 1 sets - 10 reps - 3 seconds  hold - Seated Backward Shoulder Rolls  - 2 x daily - 7 x weekly - 1 sets - 10 reps - Seated Cervical Retraction  - 2 x daily -  7 x weekly - 1 sets - 10 reps - 3 seconds  hold  ASSESSMENT:  CLINICAL IMPRESSION: Patient is a 58 y.o. F who was seen today for physical therapy evaluation and treatment for Diagnosis M54.12 (ICD-10-CM) - Radiculopathy, cervical region. Objectives as above, will make every effort to improve pain and function  moving forward.   OBJECTIVE IMPAIRMENTS: decreased ROM, decreased strength, hypomobility, increased fascial restrictions, increased muscle spasms, impaired flexibility, impaired sensation, impaired UE functional use, improper body mechanics, postural dysfunction, and pain.   ACTIVITY LIMITATIONS: carrying, lifting, dressing, self feeding, reach over head, hygiene/grooming, and caring for others  PARTICIPATION LIMITATIONS: meal prep, cleaning, laundry, driving, shopping, community activity, and yard work  PERSONAL FACTORS: Age, Behavior pattern, Education, Fitness, Past/current experiences, Social background, and Time since onset of injury/illness/exacerbation are also affecting patient's functional outcome.   REHAB POTENTIAL: Fair chronicity and severity of pain  CLINICAL DECISION MAKING: Stable/uncomplicated  EVALUATION COMPLEXITY: Low   GOALS: Goals reviewed  with patient? No  SHORT TERM GOALS: Target date: 09/02/2023    Will be compliant with appropriate progressive HEP  Baseline:  Goal status: INITIAL  2.  Will demonstrate improved posture with all functional tasks  Baseline:  Goal status: INITIAL  3.  Intensity and frequency of pain in L UE to have improved by 25%  Baseline:  Goal status: INITIAL  4. Cervical and thoracic ROM to be no more than 25% limited all planes of motion Baseline:  Goal status: INITIAL  5. Shoulder flexion and ABD AROM to have improved by 30*, FIR and FER AROM to have improved by 2 spinal levels  Baseline:  Goal status: INITIAL     LONG TERM GOALS: Target date: 09/30/2023    MMT to have improved by one grade in all weak groups  Baseline:  Goal status: INITIAL  2.  Intensity and frequency of pain in L UE to have improved by at least 50% Baseline:  Goal status: INITIAL  3.  Will be able to pick up at least 10# without increased pain or difficulty  Baseline:  Goal status: INITIAL  4.  Will be able to hold grand child in L UE for at least 10 min without difficulty  Baseline:  Goal status: INITIAL  5.  Pain in neck and B shoulders to be no more than 5/10 at worst  Baseline:  Goal status: INITIAL  6.  PSFS to have improved by at least 2 points  Baseline:  Goal status: INITIAL   PLAN:  PT FREQUENCY: 2x/week  PT DURATION: 8 weeks  PLANNED INTERVENTIONS: 97750- Physical Performance Testing, 97110-Therapeutic exercises, 97530- Therapeutic activity, [id number]- Neuromuscular re-education, 97535- Self Care, 02859- Manual therapy, and 97012- Traction (mechanical)  PLAN FOR NEXT SESSION: general cervical and thoracic mobility, postural retraining and UE strengthening within pain limits. Could consider traction with close monitoring of sx response   Josette Rough, PT, DPT 08/05/23 11:12 AM   Referring diagnosis? M54.12 Treatment diagnosis? (if different than referring diagnosis) M54.2, M62.81,  R29.3, M25.511, M25.512, R29.898 What was this (referring dx) caused by? []  Surgery []  Fall [x]  Ongoing issue []  Arthritis []  Other: ____________  Laterality: []  Rt []  Lt [x]  Both  Check all possible CPT codes:  *CHOOSE 10 OR LESS*    See Planned Interventions listed in the Plan section of the Evaluation.

## 2023-08-15 ENCOUNTER — Ambulatory Visit: Admitting: Orthopedic Surgery

## 2023-08-16 NOTE — Therapy (Signed)
 OUTPATIENT PHYSICAL THERAPY CERVICAL EVALUATION   Patient Name: Kristen [name] MRN: [id number] DOB:10-28-1965, 58 y.o., female Today's Date: 08/17/2023  END OF SESSION:  PT End of Session - 08/17/23 0933     Visit Number 2    Number of Visits 17    Date for PT Re-Evaluation 09/30/23    Authorization Type [employer]    Authorization Time Period 08/05/23 to 09/30/23    Progress Note Due on Visit 10    PT Start Time 0933    PT Stop Time 1001    PT Time Calculation (min) 28 min    Activity Tolerance Patient limited by pain    Behavior During Therapy [facility] for tasks assessed/performed           Past Medical History:  Diagnosis Date   Allergy    Anemia    Iron deficiency   Anxiety    Arthritis    back, left shoulder   Blood transfusion without reported diagnosis    Chicken pox    Depression    Diarrhea    takes Imodium  daily   GERD (gastroesophageal reflux disease)    H/O hiatal hernia    Headache(784.0)    History of kidney stones    1996ish   History of radiation therapy 11/16/16- 01/01/17   Base of Tongue/ 66 gy in 33 fractions/ Dose: 2 Gy   Hypertension    Iron deficiency anemia 12/11/2018   Migraine    none for 5 years (as of 01/13/16)   OSA (obstructive sleep apnea) 10/13/2015   unable to get cpap, plans to get one in 2018   Pneumonia    Restless legs    Scoliosis    Shingles 08/28/2013   Shortness of breath    with exertion   Sleep apnea    Tongue cancer (HCC)    tongue cancer   Past Surgical History:  Procedure Laterality Date   BACK SURGERY  07/21/1978   CARPOMETACARPEL SUSPENSION PLASTY Left 01/21/2021   Procedure: Left Thumb Ligament Carpometacarpal Arthroplasty;  Surgeon: Romona Harari, MD;  Location: [facility] OR;  Service: Orthopedics;  Laterality: Left;   CHOLECYSTECTOMY N/A 08/15/2013   Procedure: LAPAROSCOPIC CHOLECYSTECTOMY WITH INTRAOPERATIVE CHOLANGIOGRAM;  Surgeon: Lynwood MALVA Pina, MD;  Location: [facility] OR;  Service: General;  Laterality: N/A;    COLONOSCOPY N/A 01/09/2013   Procedure: COLONOSCOPY;  Surgeon: Belvie JONETTA Just, MD;  Location: [facility] ENDOSCOPY;  Service: Endoscopy;  Laterality: N/A;   COLONOSCOPY  03/2018   DIRECT LARYNGOSCOPY  07/2016   [facility]   ESOPHAGOGASTRODUODENOSCOPY  03/2018   ESOPHAGOGASTRODUODENOSCOPY (EGD) WITH PROPOFOL  N/A 04/24/2019   Procedure: ESOPHAGOGASTRODUODENOSCOPY (EGD) WITH PROPOFOL ;  Surgeon: Avram Lupita BRAVO, MD;  Location: [facility] ENDOSCOPY;  Service: Endoscopy;  Laterality: N/A;   GASTROSTOMY TUBE PLACEMENT  09/27/2016   GIVENS CAPSULE STUDY N/A 04/24/2019   Procedure: GIVENS CAPSULE STUDY;  Surgeon: Avram Lupita BRAVO, MD;  Location: [facility] ENDOSCOPY;  Service: Endoscopy;  Laterality: N/A;   HERNIA REPAIR Left 1981   IR PATIENT EVAL TECH 0-60 MINS  12/13/2016   IR PATIENT EVAL TECH 0-60 MINS  04/11/2017   IR PATIENT EVAL TECH 0-60 MINS  03/24/2018   IR PATIENT EVAL TECH 0-60 MINS  11/23/2018   IR PATIENT EVAL TECH 0-60 MINS  05/28/2019   IR PATIENT EVAL TECH 0-60 MINS  12/17/2022   IR REPLACE G-TUBE SIMPLE WO FLUORO  12/12/2017   IR REPLACE G-TUBE SIMPLE WO FLUORO  03/29/2018   IR REPLACE G-TUBE SIMPLE  WO FLUORO  06/29/2018   IR REPLACE G-TUBE SIMPLE WO FLUORO  11/02/2018   IR REPLACE G-TUBE SIMPLE WO FLUORO  03/08/2019   IR REPLACE G-TUBE SIMPLE WO FLUORO  07/02/2019   IR REPLACE G-TUBE SIMPLE WO FLUORO  11/30/2019   IR REPLACE G-TUBE SIMPLE WO FLUORO  12/27/2019   IR REPLACE G-TUBE SIMPLE WO FLUORO  05/27/2020   IR REPLACE G-TUBE SIMPLE WO FLUORO  07/09/2020   IR REPLACE G-TUBE SIMPLE WO FLUORO  10/23/2021   IR REPLACE G-TUBE SIMPLE WO FLUORO  08/19/2022   IR REPLACE G-TUBE SIMPLE WO FLUORO  07/13/2023   IR REPLC GASTRO/COLONIC TUBE PERCUT W/FLUORO  03/01/2022   MODIFIED RADICAL NECK DISSECTION Left 09/27/2016   Levels 1 & 2   PARTIAL GLOSSECTOMY Left 09/27/2016   Left hemi partial glossectomy   SPINE SURGERY  01/20/2016   fusion   TONSILLECTOMY     tracheotomy  09/27/2016   TUBAL LIGATION  06/1988    Patient Active Problem List   Diagnosis Date Noted   Acute encephalopathy 04/22/2023   Enteritis 01/12/2023   Generalized weakness 07/18/2022   Dehydration 07/18/2022   Tension headache 07/18/2022   Depression 07/18/2022   History of tongue cancer 07/18/2022   PEG tube malfunction (HCC) 02/28/2022   Protein-calorie malnutrition, severe 04/29/2021   Pseudoarthrosis of lumbar spine 04/28/2021   Fusion of spine of lumbar region 04/28/2021   Pseudarthrosis after fusion or arthrodesis 04/20/2021    Class: Chronic   Tenosynovitis, de Quervain 12/14/2020   Arthritis of carpometacarpal (CMC) joint of left thumb 12/14/2020   Aortic atherosclerosis (HCC) 08/20/2020   Hyperkalemia 07/24/2020   Alcohol use 08/05/2019   Bilateral lower extremity edema 08/05/2019   Peripheral edema 03/12/2019   Iron deficiency anemia 12/11/2018   Loosening of hardware in spine (HCC)    Other spondylosis with radiculopathy, lumbar region    History of lumbar spinal fusion 11/06/2018   UTI (urinary tract infection) 05/15/2017   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   Malignant neoplasm of base of tongue (HCC) 10/29/2016   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   Headache associated with sexual activity 05/14/2016   Tongue lesion 05/14/2016   Thrombocytopenia (HCC) 01/22/2016   Chronic diarrhea 01/22/2016   Chest pain    Essential hypertension    Spinal stenosis of lumbar region 01/20/2016    Class: Chronic   DDD (degenerative disc disease), lumbar 01/20/2016    Class: Chronic   Spinal stenosis of lumbar region with neurogenic claudication 01/20/2016   Low back pain 12/22/2015   Hypersomnolence 10/13/2015   Muscle cramp 08/01/2015   GERD (gastroesophageal reflux disease) 07/09/2015   Anemia of chronic disease 01/08/2013    PCP: Sigrid Deidra Fox MD   REFERRING PROVIDER: Georgina Ozell LABOR, MD  REFERRING DIAG: Diagnosis M54.12 (ICD-10-CM) - Radiculopathy,  cervical region  THERAPY DIAG:  Cervicalgia  Muscle weakness (generalized)  Abnormal posture  Bilateral shoulder pain, unspecified chronicity  Other symptoms and signs involving the musculoskeletal system  Rationale for Evaluation and Treatment: Rehabilitation  ONSET DATE: about 2-3 months ago   SUBJECTIVE:  SUBJECTIVE STATEMENT: Rocked grandson yesterday and is having pain in Rt shoulder, Rt LE went numb, and is currently having a massive headache causing eye pain and cervical muscle pain. The leg has changed to a dull ache instead of completely numb.   Hand dominance: Right  PERTINENT HISTORY:  Usually my pain starts when I first get up in the morning, especially L shoulder but sometimes it can go down to both hands. Sometimes it eases off, sometimes it doesn't. Sometimes laying down and getting mind off of it can help, sometimes when it hurts there's no getting away from it. Holding 30# grand child can be difficult with LUE, have to switch sides a lot. Steroids helped. When my arm hurts, my whole body gets off including my BP  See above   PAIN:  Are you having pain? Yes: NPRS scale: 3/10 Pain location: mostly in neck, not as much in L shoulder today  Pain description: tightness, sharp throbbing pains  Aggravating factors: not sure  Relieving factors: unclear, sometimes laying down helps, sometimes nothing helps   PRECAUTIONS: Activity as tolerated, no spine specific precautions per MD note; still has G-tube   RED FLAGS: None     WEIGHT BEARING RESTRICTIONS: No  FALLS:  Has patient fallen in last 6 months? Yes. Number of falls 2- was sick at the time, sometimes feels off balance now but no falls when healthy   LIVING ENVIRONMENT: Lives with: lives with their  family Lives in: House/apartment   OCCUPATION: not working/on disability   PLOF: Independent, Independent with basic ADLs, Independent with gait, and Independent with transfers  PATIENT GOALS: address pain in neck and shoulders, avoid surgery   NEXT MD VISIT: Referring 08/25/23  OBJECTIVE:  Note: Objective measures were completed at Evaluation unless otherwise noted.  DIAGNOSTIC FINDINGS:  Imaging: XRs of the lumbar spine from 03/08/2023 were previously independently reviewed and interpreted, showing scoliosis with apex to the left at T12.  Chronically broken or cut rod seen at the lower thoracic spine on the right side.  Posterior instrumentation from L1-S1.  No lucency seen around the posterior fixation.  Interbody devices seen at L3/4, L4/5, and L5/S1.  No lucency seen around the interbody devices.  Devices appear in appropriate position.  No fracture or dislocation seen.  No evidence of instability on flexion/extension views.   XRs of the cervical spine from 07/14/2023 were independently reviewed and interpreted, showing disc height loss at C5/6 with anterior osteophyte formation. No other significant degenerative changes. Surgical staples along the left side of the neck anterior to the cervical spine.  No evidence of instability on flexion/extension views.  No fracture dislocation seen.  PATIENT SURVEYS:   THE PATIENT SPECIFIC FUNCTIONAL SCALE  Place score of 0-10 (0 = unable to perform activity and 10 = able to perform activity at the same level as before injury or problem)  Activity Date: 08/05/23 Eval     Laying on L side  5    2. Lifting >10# 5    3. Holding grandchild  5    4.      Total Score 5      Total Score = Sum of activity scores/number of activities  Minimally Detectable Change: 3 points (for single activity); 2 points (for average score)  [facility] (nd). The Patient Specific Functional Scale . Retrieved from  [email or web address]   COGNITION: Overall cognitive status: Within functional limits for tasks assessed  SENSATION: Intermittent numbness in arms/hands sometimes, not at  time of eval   POSTURE: rounded shoulders, forward head, decreased lumbar lordosis, and decreased thoracic kyphosis  PALPATION:  very tight and tender B upper traps (more notable trigger points and spasms noted L UT), R thoracic paraspinals very tender but L OK      CERVICAL ROM:   Active ROM A/PROM (deg) eval  Flexion 25% limited   Extension 40% limited   Right lateral flexion 50% limited more painful   Left lateral flexion 50% limited not as bad as right   Right rotation 25% limited   Left rotation 50% limited    (Blank rows = not tested)    Thoracic AROM: flexion 75% limited, extension extreme limitation/barely mobile; lateral flexion very limited, compensates from lumbar side bending; rotation 50% limited B   UPPER EXTREMITY ROM:  Active ROM Right eval Left eval  Shoulder flexion 90* 80*  Shoulder extension    Shoulder abduction 100* 100*  Shoulder adduction    Shoulder extension    Shoulder internal rotation T12  T12   Shoulder external rotation C7  Upper trap   Elbow flexion    Elbow extension    Wrist flexion    Wrist extension    Wrist ulnar deviation    Wrist radial deviation    Wrist pronation    Wrist supination     (Blank rows = not tested)  UPPER EXTREMITY MMT:  MMT Right eval Left eval  Shoulder flexion 4- 3-  Shoulder extension    Shoulder abduction 3+ 3-  Shoulder adduction    Shoulder extension    Shoulder internal rotation 4- 3+  Shoulder external rotation 4- 3+  Middle trapezius    Lower trapezius    Elbow flexion    Elbow extension    Wrist flexion    Wrist extension    Wrist ulnar deviation    Wrist radial deviation    Wrist pronation    Wrist supination    Grip strength     (Blank rows = not  tested)    TREATMENT DATE:  08/17/2023: Self-Care:  Education provided on high blood pressure and causes for high blood pressure (specifically pain), radiating pain from neck into shoulder, and radiating pain into L leg Blood pressure assessment  0943: Rt arm, sitting, automatic cuff, 156/98 0946: Rt arm, sitting, automatic cuff, 147/109 0950: Rt arm, sitting, manual cuff, 148/100 0954: Lt arm, sitting, manual cuff, 180/110   08/05/23  Eval, POC, HEP practice and education as below                                                                                                                                 PATIENT EDUCATION:  Education details: exam findings, POC, HEP  Person educated: Patient Education method: Explanation, Demonstration, and Handouts Education comprehension: verbalized understanding, returned demonstration, and needs further education  HOME EXERCISE PROGRAM: Access Code: [email or web address] URL: [email or web address] Date: 08/05/2023 Prepared by: Josette Rough  Exercises - Seated Upper Trapezius Stretch  - 2 x daily - 7 x weekly - 1 sets - 6 reps - 30 seconds  hold - Gentle Levator Scapulae Stretch  - 2 x daily - 7 x weekly - 1 sets - 6 reps - Seated Scapular Retraction  - 2 x daily - 7 x weekly - 1 sets - 10 reps - 3 seconds  hold - Seated Backward Shoulder Rolls  - 2 x daily - 7 x weekly - 1 sets - 10 reps - Seated Cervical Retraction  - 2 x daily - 7 x weekly - 1 sets - 10 reps - 3 seconds  hold  ASSESSMENT:  CLINICAL IMPRESSION: Patient arrived to session this date noting massive headache with eye pain, neck pain, L shoulder pain, and L radiating leg pain that started as complete numbness this morning and evolved to a dull ache. PT assessed BP which began as borderline, but continued to rise until final assessment measure 180/110. PT educated patient on high blood pressure, symptoms associated with high blood pressure, and advised patient to go to  ED. Patient endorses compliance with blood pressure medication. PT walked patient out to car for safety purposes, educated patient's mother on the situation and advised patient's mother to take patient to ED. Once medically cleared to return to PT, patient will benefit from skilled PT.  OBJECTIVE IMPAIRMENTS: decreased ROM, decreased strength, hypomobility, increased fascial restrictions, increased muscle spasms, impaired flexibility, impaired sensation, impaired UE functional use, improper body mechanics, postural dysfunction, and pain.   ACTIVITY LIMITATIONS: carrying, lifting, dressing, self feeding, reach over head, hygiene/grooming, and caring for others  PARTICIPATION LIMITATIONS: meal prep, cleaning, laundry, driving, shopping, community activity, and yard work  PERSONAL FACTORS: Age, Behavior pattern, Education, Fitness, Past/current experiences, Social background, and Time since onset of injury/illness/exacerbation are also affecting patient's functional outcome.   REHAB POTENTIAL: Fair chronicity and severity of pain  CLINICAL DECISION MAKING: Stable/uncomplicated  EVALUATION COMPLEXITY: Low   GOALS: Goals reviewed with patient? No  SHORT TERM GOALS: Target date: 09/02/2023    Will be compliant with appropriate progressive HEP  Baseline:  Goal status: INITIAL  2.  Will demonstrate improved posture with all functional tasks  Baseline:  Goal status: INITIAL  3.  Intensity and frequency of pain in L UE to have improved by 25%  Baseline:  Goal status: INITIAL  4. Cervical and thoracic ROM to be no more than 25% limited all planes of motion Baseline:  Goal status: INITIAL  5. Shoulder flexion and ABD AROM to have improved by 30*, FIR and FER AROM to have improved by 2 spinal levels  Baseline:  Goal status: INITIAL     LONG TERM GOALS: Target date: 09/30/2023    MMT to have improved by one grade in all weak groups  Baseline:  Goal status: INITIAL  2.  Intensity  and frequency of pain in L UE to have improved by at least 50% Baseline:  Goal status: INITIAL  3.  Will be able to pick up at least 10# without increased pain or difficulty  Baseline:  Goal status: INITIAL  4.  Will be able to hold grand child in L UE for at least 10 min without difficulty  Baseline:  Goal status: INITIAL  5.  Pain in neck and B shoulders to be no more than 5/10 at worst  Baseline:  Goal status: INITIAL  6.  PSFS to have improved by at least 2 points  Baseline:  Goal status: INITIAL   PLAN:  PT FREQUENCY: 2x/week  PT DURATION: 8 weeks  PLANNED INTERVENTIONS: 97750- Physical Performance Testing, 97110-Therapeutic exercises, 97530- Therapeutic activity, [id number]- Neuromuscular re-education, 97535- Self Care, 02859- Manual therapy, and 97012- Traction (mechanical)  PLAN FOR NEXT SESSION: assess BP, general cervical and thoracic mobility, postural retraining and UE strengthening within pain limits. Could consider traction with close monitoring of sx response   Susannah Daring, PT, DPT 08/17/23 12:04 PM   Referring diagnosis? M54.12 Treatment diagnosis? (if different than referring diagnosis) M54.2, M62.81, R29.3, M25.511, M25.512, R29.898 What was this (referring dx) caused by? []  Surgery []  Fall [x]  Ongoing issue []  Arthritis []  Other: ____________  Laterality: []  Rt []  Lt [x]  Both  Check all possible CPT codes:  *CHOOSE 10 OR LESS*    See Planned Interventions listed in the Plan section of the Evaluation.

## 2023-08-17 ENCOUNTER — Other Ambulatory Visit: Payer: Self-pay

## 2023-08-17 ENCOUNTER — Encounter (HOSPITAL_COMMUNITY): Payer: Self-pay | Admitting: *Deleted

## 2023-08-17 ENCOUNTER — Ambulatory Visit (INDEPENDENT_AMBULATORY_CARE_PROVIDER_SITE_OTHER)

## 2023-08-17 ENCOUNTER — Observation Stay (HOSPITAL_COMMUNITY)
Admission: EM | Admit: 2023-08-17 | Discharge: 2023-08-20 | Disposition: A | Discharge status: Home / Self Care | Source: Ambulatory Visit | Attending: Internal Medicine | Admitting: Internal Medicine

## 2023-08-17 VITALS — BP 180/110 | HR 74

## 2023-08-17 DIAGNOSIS — M25512 Pain in left shoulder: Secondary | ICD-10-CM

## 2023-08-17 DIAGNOSIS — R519 Headache, unspecified: Secondary | ICD-10-CM | POA: Diagnosis present

## 2023-08-17 DIAGNOSIS — M542 Cervicalgia: Secondary | ICD-10-CM

## 2023-08-17 DIAGNOSIS — N390 Urinary tract infection, site not specified: Principal | ICD-10-CM | POA: Diagnosis present

## 2023-08-17 DIAGNOSIS — E876 Hypokalemia: Secondary | ICD-10-CM | POA: Diagnosis not present

## 2023-08-17 DIAGNOSIS — Z7982 Long term (current) use of aspirin: Secondary | ICD-10-CM | POA: Insufficient documentation

## 2023-08-17 DIAGNOSIS — Z79899 Other long term (current) drug therapy: Secondary | ICD-10-CM | POA: Diagnosis not present

## 2023-08-17 DIAGNOSIS — I10 Essential (primary) hypertension: Secondary | ICD-10-CM | POA: Diagnosis present

## 2023-08-17 DIAGNOSIS — M6281 Muscle weakness (generalized): Secondary | ICD-10-CM

## 2023-08-17 DIAGNOSIS — R293 Abnormal posture: Secondary | ICD-10-CM | POA: Diagnosis not present

## 2023-08-17 DIAGNOSIS — C01 Malignant neoplasm of base of tongue: Secondary | ICD-10-CM | POA: Diagnosis present

## 2023-08-17 DIAGNOSIS — M25511 Pain in right shoulder: Secondary | ICD-10-CM

## 2023-08-17 DIAGNOSIS — Z931 Gastrostomy status: Secondary | ICD-10-CM

## 2023-08-17 DIAGNOSIS — N3000 Acute cystitis without hematuria: Secondary | ICD-10-CM | POA: Diagnosis not present

## 2023-08-17 DIAGNOSIS — Z431 Encounter for attention to gastrostomy: Secondary | ICD-10-CM | POA: Insufficient documentation

## 2023-08-17 DIAGNOSIS — R29898 Other symptoms and signs involving the musculoskeletal system: Secondary | ICD-10-CM

## 2023-08-17 LAB — URINALYSIS, ROUTINE W REFLEX MICROSCOPIC
Bilirubin Urine: NEGATIVE
Glucose, UA: NEGATIVE mg/dL
Ketones, ur: NEGATIVE mg/dL
Nitrite: NEGATIVE
Protein, ur: NEGATIVE mg/dL
Specific Gravity, Urine: 1.004 — ABNORMAL LOW (ref 1.005–1.030)
pH: 6 (ref 5.0–8.0)

## 2023-08-17 LAB — COMPREHENSIVE METABOLIC PANEL WITH GFR
ALT: 10 U/L (ref 0–44)
AST: 14 U/L — ABNORMAL LOW (ref 15–41)
Albumin: 3.9 g/dL (ref 3.5–5.0)
Alkaline Phosphatase: 174 U/L — ABNORMAL HIGH (ref 38–126)
Anion gap: 12 (ref 5–15)
BUN: <5 mg/dL — ABNORMAL LOW (ref 6–20)
CO2: 26 mmol/L (ref 22–32)
Calcium: 9.4 mg/dL (ref 8.9–10.3)
Chloride: 104 mmol/L (ref 98–111)
Creatinine, Ser: 0.34 mg/dL — ABNORMAL LOW (ref 0.44–1.00)
GFR, Estimated: >60 mL/min (ref >60)
Glucose, Bld: 100 mg/dL — ABNORMAL HIGH (ref 70–99)
Potassium: 2.5 mmol/L — CL (ref 3.5–5.1)
Sodium: 142 mmol/L (ref 135–145)
Total Bilirubin: 0.8 mg/dL (ref 0.0–1.2)
Total Protein: 7.8 g/dL (ref 6.5–8.1)

## 2023-08-17 LAB — CBC WITH DIFFERENTIAL/PLATELET
Abs Immature Granulocytes: 0.02 K/uL (ref 0.00–0.07)
Basophils Absolute: 0 K/uL (ref 0.0–0.1)
Basophils Relative: 1 %
Eosinophils Absolute: 0 K/uL (ref 0.0–0.5)
Eosinophils Relative: 1 %
HCT: 40.1 % (ref 36.0–46.0)
Hemoglobin: 12.8 g/dL (ref 12.0–15.0)
Immature Granulocytes: 1 %
Lymphocytes Relative: 31 %
Lymphs Abs: 1.2 K/uL (ref 0.7–4.0)
MCH: 27.5 pg (ref 26.0–34.0)
MCHC: 31.9 g/dL (ref 30.0–36.0)
MCV: 86.2 fL (ref 80.0–100.0)
Monocytes Absolute: 0.3 K/uL (ref 0.1–1.0)
Monocytes Relative: 7 %
Neutro Abs: 2.4 K/uL (ref 1.7–7.7)
Neutrophils Relative %: 59 %
Platelets: 224 K/uL (ref 150–400)
RBC: 4.65 MIL/uL (ref 3.87–5.11)
RDW: 13.6 % (ref 11.5–15.5)
WBC: 4 K/uL (ref 4.0–10.5)
nRBC: 0 % (ref 0.0–0.2)

## 2023-08-17 MED ORDER — HYDRALAZINE HCL 20 MG/ML IJ SOLN: 10.0000 mg | INTRAMUSCULAR | Status: DC | PRN

## 2023-08-17 MED ORDER — ACETAMINOPHEN 325 MG PO TABS
650.0000 mg | ORAL_TABLET | Freq: Four times a day (QID) | ORAL | Status: DC | PRN
  Administered 2023-08-18 – 2023-08-20 (×3): 650 mg via ORAL
  Filled 2023-08-17 (×3): qty 2

## 2023-08-17 MED ORDER — BUTALBITAL-APAP-CAFFEINE 50-325-40 MG PO TABS
1.0000 | ORAL_TABLET | Freq: Four times a day (QID) | ORAL | Status: DC | PRN
  Administered 2023-08-18: 1 via ORAL
  Filled 2023-08-17: qty 1

## 2023-08-17 MED ORDER — ONDANSETRON HCL 4 MG/2ML IJ SOLN: 4.0000 mg | Freq: Four times a day (QID) | INTRAMUSCULAR | Status: DC | PRN

## 2023-08-17 MED ORDER — SERTRALINE HCL 50 MG PO TABS
25.0000 mg | ORAL_TABLET | Freq: Every day | ORAL | Status: DC
  Administered 2023-08-18 – 2023-08-20 (×3): 25 mg
  Filled 2023-08-17 (×3): qty 1

## 2023-08-17 MED ORDER — PROCHLORPERAZINE MALEATE 10 MG PO TABS
10.0000 mg | ORAL_TABLET | Freq: Once | ORAL | Status: DC
  Filled 2023-08-17: qty 1

## 2023-08-17 MED ORDER — POTASSIUM CHLORIDE CRYS ER 20 MEQ PO TBCR
40.0000 meq | EXTENDED_RELEASE_TABLET | Freq: Once | ORAL | Status: DC
  Filled 2023-08-17: qty 2

## 2023-08-17 MED ORDER — ENOXAPARIN SODIUM 40 MG/0.4ML IJ SOSY
40.0000 mg | PREFILLED_SYRINGE | INTRAMUSCULAR | Status: DC
  Administered 2023-08-17 – 2023-08-19 (×3): 40 mg via SUBCUTANEOUS
  Filled 2023-08-17 (×4): qty 0.4

## 2023-08-17 MED ORDER — POTASSIUM CHLORIDE 10 MEQ/100ML IV SOLN
10.0000 meq | INTRAVENOUS | Status: DC
  Administered 2023-08-17 (×3): 10 meq via INTRAVENOUS
  Filled 2023-08-17 (×4): qty 100

## 2023-08-17 MED ORDER — DIPHENHYDRAMINE HCL 25 MG PO CAPS
25.0000 mg | ORAL_CAPSULE | Freq: Once | ORAL | Status: DC
  Filled 2023-08-17: qty 1

## 2023-08-17 MED ORDER — DIPHENOXYLATE-ATROPINE 2.5-0.025 MG PO TABS
1.0000 | ORAL_TABLET | Freq: Two times a day (BID) | ORAL | Status: DC | PRN
  Administered 2023-08-18 – 2023-08-20 (×5): 1
  Filled 2023-08-17 (×5): qty 1

## 2023-08-17 MED ORDER — SODIUM CHLORIDE 0.9 % IV SOLN
1.0000 g | Freq: Once | INTRAVENOUS | Status: DC
  Filled 2023-08-17: qty 10

## 2023-08-17 MED ORDER — AMLODIPINE BESYLATE 5 MG PO TABS
10.0000 mg | ORAL_TABLET | Freq: Every day | ORAL | Status: DC
  Administered 2023-08-18 – 2023-08-20 (×3): 10 mg
  Filled 2023-08-17 (×3): qty 2

## 2023-08-17 MED ORDER — ALBUTEROL SULFATE (2.5 MG/3ML) 0.083% IN NEBU: 2.5000 mg | INHALATION_SOLUTION | Freq: Four times a day (QID) | RESPIRATORY_TRACT | Status: DC | PRN

## 2023-08-17 MED ORDER — DIPHENHYDRAMINE HCL 50 MG/ML IJ SOLN
25.0000 mg | Freq: Four times a day (QID) | INTRAMUSCULAR | Status: DC | PRN
  Administered 2023-08-19 – 2023-08-20 (×2): 25 mg via INTRAVENOUS
  Filled 2023-08-17 (×2): qty 1

## 2023-08-17 MED ORDER — SODIUM CHLORIDE 0.9 % IV SOLN
1.0000 g | INTRAVENOUS | Status: DC
  Administered 2023-08-17 – 2023-08-19 (×3): 1 g via INTRAVENOUS
  Filled 2023-08-17 (×2): qty 10

## 2023-08-17 MED ORDER — ACETAMINOPHEN 650 MG RE SUPP: 650.0000 mg | Freq: Four times a day (QID) | RECTAL | Status: DC | PRN

## 2023-08-17 MED ORDER — POTASSIUM CHLORIDE 20 MEQ PO PACK
80.0000 meq | PACK | Freq: Once | ORAL | Status: AC
  Administered 2023-08-17: 80 meq
  Filled 2023-08-17: qty 4

## 2023-08-17 MED ORDER — POTASSIUM CHLORIDE 10 MEQ/100ML IV SOLN
10.0000 meq | INTRAVENOUS | Status: AC
  Administered 2023-08-17 (×2): 10 meq via INTRAVENOUS

## 2023-08-17 MED ORDER — ONDANSETRON HCL 4 MG PO TABS: 4.0000 mg | ORAL_TABLET | Freq: Four times a day (QID) | ORAL | Status: DC | PRN

## 2023-08-17 MED ORDER — PROCHLORPERAZINE EDISYLATE 10 MG/2ML IJ SOLN
10.0000 mg | Freq: Four times a day (QID) | INTRAMUSCULAR | Status: DC | PRN
  Administered 2023-08-18 – 2023-08-20 (×3): 10 mg via INTRAVENOUS
  Filled 2023-08-17 (×3): qty 2

## 2023-08-17 MED ORDER — DIPHENHYDRAMINE HCL 50 MG/ML IJ SOLN
25.0000 mg | Freq: Once | INTRAMUSCULAR | Status: AC
  Administered 2023-08-17: 25 mg via INTRAVENOUS
  Filled 2023-08-17: qty 1

## 2023-08-17 MED ORDER — FAMOTIDINE 40 MG/5ML PO SUSR
20.0000 mg | Freq: Two times a day (BID) | ORAL | Status: DC
  Administered 2023-08-17 – 2023-08-20 (×6): 20 mg
  Filled 2023-08-17 (×7): qty 2.5

## 2023-08-17 MED ORDER — PROCHLORPERAZINE EDISYLATE 10 MG/2ML IJ SOLN
10.0000 mg | Freq: Once | INTRAMUSCULAR | Status: AC
  Administered 2023-08-17: 10 mg via INTRAVENOUS
  Filled 2023-08-17: qty 2

## 2023-08-17 MED ORDER — PANTOPRAZOLE SODIUM 40 MG PO TBEC: 40.0000 mg | DELAYED_RELEASE_TABLET | Freq: Every day | ORAL | Status: DC

## 2023-08-17 NOTE — ED Provider Notes (Signed)
 [place] EMERGENCY DEPARTMENT AT [facility] Provider Note   CSN: [id number] Arrival date & time: 08/17/23  1008     Patient presents with: Hypertension, Headache, and Neck Pain   Kristen [name] is a 58 y.o. female.   58 year old female presenting with multiple complaints.  Patient complains of headache that has been ongoing since this morning, she complains of neck pain but does have history of chronic neck/back pain, she was also found to be hypertensive at her physical therapy appointment this morning, she is on amlodipine  and took this around 9 AM.  She endorses a headache to the posterior aspect of her head, she did try ibuprofen  at home with some relief of her symptoms but headache persists, she is not on blood thinners.  This morning when getting out of bed she noticed some numbness to her left lower extremity, this lasted approximately 20 minutes and resolved and has not since returned.  Patient was recently hospitalized for sepsis secondary to urinary tract infection, she request that I check a urinalysis, she is not having dysuria but I want to make sure it is gone.  Denies chest pain, shortness of breath, fever.   Hypertension Associated symptoms include headaches.  Headache Associated symptoms: neck pain   Neck Pain Associated symptoms: headaches        Prior to Admission medications   Medication Sig Start Date End Date Taking? Authorizing Provider  acetaminophen  (TYLENOL ) 500 MG tablet Take 2 tablets (1,000 mg total) by mouth every 8 (eight) hours as needed for moderate pain or mild pain. Patient taking differently: Place 1,000 mg into feeding tube every 8 (eight) hours as needed for moderate pain (pain score 4-6) or mild pain (pain score 1-3). 04/21/22   Georgina Ozell LABOR, MD  AMBULATORY NON FORMULARY MEDICATION Jevity 1.5 Sig: 6 cans per day, Bolus feeding  60ml syringes #30  Tape and gauze 03/12/22   Avram Lupita BRAVO, MD  amLODipine  (NORVASC ) 10 MG  tablet TAKE 1 TABLET BY MOUTH EVERY DAY Patient taking differently: Place 10 mg into feeding tube daily. 12/14/22   Mercer Clotilda SAUNDERS, MD  aspirin  81 MG chewable tablet Place 81 mg into feeding tube daily.  10/05/16   [provider]  betamethasone  dipropionate 0.05 % cream Apply 1 Application topically 2 (two) times daily as needed (eczema). 04/06/23   [provider]  Calcium  Citrate 200 MG TABS Place 200 mg into feeding tube daily at 12 noon.    [provider]  cholecalciferol  (VITAMIN D3) 10 MCG (400 UNIT) TABS tablet Place 1 tablet (400 Units total) into feeding tube daily. 03/31/23   Avram Lupita BRAVO, MD  cyanocobalamin  (VITAMIN B12) 500 MCG tablet Take 1 tablet (500 mcg total) by mouth daily. 04/28/23   Samtani, Jai-Gurmukh, MD  dexlansoprazole  (DEXILANT ) 60 MG capsule ADMINISTER 1 CAPSULE BY MOUTH VIA FEEDING TUBE DAILY 03/31/23   Avram Lupita BRAVO, MD  diclofenac  (VOLTAREN ) 75 MG EC tablet TAKE 1 TABLET(75 MG) BY MOUTH TWICE DAILY WITH A MEAL 05/19/23   Georgina Ozell LABOR, MD  diphenoxylate -atropine  (LOMOTIL ) 2.5-0.025 MG tablet TAKE TWO TABLETS VIA TUBE TWICE DAILY 05/30/23   Avram Lupita BRAVO, MD  ferrous sulfate  220 [phone] Fe) MG/5ML solution Take 6.8 ml via feeding tube daily 05/17/23   Avram Lupita BRAVO, MD  folic acid  (FOLVITE ) 400 MCG tablet Place 400 mcg into feeding tube daily.    [provider]  ketoconazole (NIZORAL) 2 % cream Apply 1 Application topically 2 (two)  times daily as needed for irritation. 04/06/23   [provider]  lidocaine  (LMX) 4 % cream Apply 1 application  topically as needed (to affected sites- for pain).    [provider]  methylPREDNISolone  (MEDROL  DOSEPAK) 4 MG TBPK tablet Take as prescribed on the box 07/14/23   Georgina Ozell LABOR, MD  naproxen  (NAPROSYN ) 500 MG tablet Take 500 mg by mouth every 12 (twelve) hours as needed for mild pain (pain score 1-3) or moderate pain (pain score 4-6). 03/30/23   [provider]   ondansetron  (ZOFRAN -ODT) 4 MG disintegrating tablet Take 1 tablet (4 mg total) by mouth every 8 (eight) hours as needed for nausea or vomiting. 08/24/21   Geiple, Joshua, PA-C  polyethylene glycol (MIRALAX  / GLYCOLAX ) 17 g packet Place 17 g into feeding tube daily as needed for moderate constipation. 04/28/23   Samtani, Jai-Gurmukh, MD  potassium chloride  (KLOR-CON  M) 10 MEQ tablet Take 10 mEq by mouth daily. 03/29/23   [provider]  sertraline  (ZOLOFT ) 25 MG tablet TAKE 1 TABLET BY MOUTH EVERY DAY Patient taking differently: Place 25 mg into feeding tube daily. 04/26/22   Mercer Clotilda SAUNDERS, MD  TERBINAFINE EX Apply 1 drop topically at bedtime. Terbinafine 1000mg  Dmso susp. Apply 1 drop to all toenails nightly at bedtime. 04/06/23   [provider]    Allergies: Sulfa antibiotics and Tomato    Review of Systems  Musculoskeletal:  Positive for neck pain.  Neurological:  Positive for headaches.    Updated Vital Signs BP (!) 149/102 (BP Location: Right Arm)   Pulse 67   Temp 98.3 F (36.8 C) (Oral)   Resp 16   Ht 5' 7 (1.702 m)   Wt 86.6 kg   LMP 11/15/2013   SpO2 97%   BMI 29.91 kg/m   Physical Exam Vitals and nursing note reviewed.  HENT:     Head: Normocephalic.  Eyes:     Extraocular Movements: Extraocular movements intact.     Pupils: Pupils are equal, round, and reactive to light.  Neck:     Vascular: No carotid bruit.  Cardiovascular:     Rate and Rhythm: Normal rate and regular rhythm.  Pulmonary:     Effort: Pulmonary effort is normal.     Breath sounds: Normal breath sounds.  Musculoskeletal:     Cervical back: Normal range of motion.     Comments: Mild TTP of C-spine/T-spine/L-spine, no paraspinal muscle tenderness 5/5 strength against resistance of bilateral upper and lower extremities  Skin:    General: Skin is warm and dry.  Neurological:     Mental Status: She is alert and oriented to person, place, and time.     Sensory: No sensory  deficit.     Motor: No weakness.     Comments: Facial expressions are symmetric and intact without evidence of facial droop Grip strength intact and equal bilaterally No appreciable sensory deficit/weakness Normal cerebellar testing, including rapid alternating movements      (all labs ordered are listed, but only abnormal results are displayed) Labs Reviewed  CBC WITH DIFFERENTIAL/PLATELET  COMPREHENSIVE METABOLIC PANEL WITH GFR  URINALYSIS, ROUTINE W REFLEX MICROSCOPIC    EKG: None  Radiology: No results found.   Procedures   Medications Ordered in the ED  prochlorperazine  (COMPAZINE ) tablet 10 mg (has no administration in time range)  diphenhydrAMINE  (BENADRYL ) capsule 25 mg (has no administration in time range)  Medical Decision Making This patient presents to the ED for concern of multiple complaints, this involves an extensive number of treatment options, and is a complaint that carries with it a high risk of complications and morbidity.  The differential diagnosis includes tension headache, migraine headache, chronic neck/back pain, radiculopathy   Co morbidities that complicate the patient evaluation  Hypertension, degenerative disc disease   Additional history obtained:  Additional history obtained from record review External records from outside source obtained and reviewed including prior ED/hospital notes   Lab Tests:  Pending at time of shift change    Cardiac Monitoring: / EKG:  The patient was maintained on a cardiac monitor.  I personally viewed and interpreted the cardiac monitored which showed an underlying rhythm of: NSR    Problem List / ED Course / Critical interventions / Medication management  I ordered medication including Compazine  and Benadryl  for headache  Social Determinants of Health:  Depression, financial instability   Test / Admission - Considered:  Physical exam largely  unremarkable as above, normal neurologic exam, will trial migraine cocktail and reassess patient, at this time given normal neurologic exam I do not feel that a head CT is warranted.  I suspect that patient's left lower extremity numbness she experienced today is in line with her prior episodes of sciatica, there was no appreciable sensory deficit or weakness noted on her exam today, I have a very low suspicion for stroke/TIA as contributing to her symptoms today, it is much more likely that she is experiencing an exacerbation of her chronic neck/back pain as contributing to her symptoms.  Labs and reassessment of patient's status are pending at time of shift change, patient handed off to PA-C Bowie Tran, please see their note for further assessment/plan after reassessment of patient's response to migraine cocktail.    Amount and/or Complexity of Data Reviewed Labs: ordered.  Risk Prescription drug management.        Final diagnoses:  None    ED Discharge Orders     None          Glendia Rocky LOISE DEVONNA 08/17/23 1528    Dasie Faden, MD 08/20/23 [phone]

## 2023-08-17 NOTE — ED Triage Notes (Signed)
 Pain in neck and left shoulder pain,, has history of nerve sensitivity from neck, C/o HTN (took BP meds this morning) and Headache starting this morning.

## 2023-08-17 NOTE — ED Provider Notes (Signed)
 Received signout from previous provider, please see the note for complete H&P.  58 year old female presenting with multiple complaints but her primary complaint is headache and neck pain ongoing throughout the day today.  She did endorse some light sensitivity with a headache, but denies any nausea or vomiting fever chills lightheadedness or dizziness.  Neck pain is chronic and currently receiving care through [email or web address]  Patient also states that she was recently admitted to the hospital for a urinary tract infection and she request to have her urine checked.  She did not endorse having any urinary symptoms.  Patient denies any nausea vomiting diarrhea and she also denies any recent change in her medication.  Labs obtained today significant for low potassium of 2.5.  Unfortunately patient cannot tolerate pills by mouth and will need to have potassium replenished through IV.   Urine has resulted and is positive for urinary tract infection.  Will give Rocephin , urine culture sent, will consult for hospital admission.  8:18 PM Appreciate consultation from [facility]  Hospitalist Dr. Claudene who agrees to admit pt for hypokalemia and UTi  BP (!) 141/98 (BP Location: Right Arm)   Pulse 65   Temp 98 F (36.7 C) (Oral)   Resp 18   Ht 5' 7 (1.702 m)   Wt 86.6 kg   LMP 11/15/2013   SpO2 98%   BMI 29.91 kg/m   Results for orders placed or performed during the hospital encounter of 08/17/23  Urinalysis, Routine w reflex microscopic -Urine, Clean Catch   Collection Time: 08/17/23  1:15 PM  Result Value Ref Range   Color, Urine YELLOW YELLOW   APPearance HAZY (A) CLEAR   Specific Gravity, Urine 1.004 (L) 1.005 - 1.030   pH 6.0 5.0 - 8.0   Glucose, UA NEGATIVE NEGATIVE mg/dL   Hgb urine dipstick SMALL (A) NEGATIVE   Bilirubin Urine NEGATIVE NEGATIVE   Ketones, ur NEGATIVE NEGATIVE mg/dL   Protein, ur NEGATIVE NEGATIVE mg/dL   Nitrite NEGATIVE NEGATIVE   Leukocytes,Ua LARGE (A) NEGATIVE   RBC / HPF  0-5 0 - 5 RBC/hpf   WBC, UA 21-50 0 - 5 WBC/hpf   Bacteria, UA MANY (A) NONE SEEN   Squamous Epithelial / HPF 0-5 0 - 5 /HPF   Non Squamous Epithelial 0-5 (A) NONE SEEN  CBC with Differential   Collection Time: 08/17/23  3:36 PM  Result Value Ref Range   WBC 4.0 4.0 - 10.5 K/uL   RBC 4.65 3.87 - 5.11 MIL/uL   Hemoglobin 12.8 12.0 - 15.0 g/dL   HCT 59.8 63.9 - 53.9 %   MCV 86.2 80.0 - 100.0 fL   MCH 27.5 26.0 - 34.0 pg   MCHC 31.9 30.0 - 36.0 g/dL   RDW 86.3 88.4 - 84.4 %   Platelets 224 150 - 400 K/uL   nRBC 0.0 0.0 - 0.2 %   Neutrophils Relative % 59 %   Neutro Abs 2.4 1.7 - 7.7 K/uL   Lymphocytes Relative 31 %   Lymphs Abs 1.2 0.7 - 4.0 K/uL   Monocytes Relative 7 %   Monocytes Absolute 0.3 0.1 - 1.0 K/uL   Eosinophils Relative 1 %   Eosinophils Absolute 0.0 0.0 - 0.5 K/uL   Basophils Relative 1 %   Basophils Absolute 0.0 0.0 - 0.1 K/uL   Immature Granulocytes 1 %   Abs Immature Granulocytes 0.02 0.00 - 0.07 K/uL  Comprehensive metabolic panel   Collection Time: 08/17/23  3:36 PM  Result Value Ref  Range   Sodium 142 135 - 145 mmol/L   Potassium 2.5 (LL) 3.5 - 5.1 mmol/L   Chloride 104 98 - 111 mmol/L   CO2 26 22 - 32 mmol/L   Glucose, Bld 100 (H) 70 - 99 mg/dL   BUN <5 (L) 6 - 20 mg/dL   Creatinine, Ser 9.65 (L) 0.44 - 1.00 mg/dL   Calcium  9.4 8.9 - 10.3 mg/dL   Total Protein 7.8 6.5 - 8.1 g/dL   Albumin  3.9 3.5 - 5.0 g/dL   AST 14 (L) 15 - 41 U/L   ALT 10 0 - 44 U/L   Alkaline Phosphatase 174 (H) 38 - 126 U/L   Total Bilirubin 0.8 0.0 - 1.2 mg/dL   GFR, Estimated >39 >39 mL/min   Anion gap 12 5 - 15   *Note: Due to a large number of results and/or encounters for the requested time period, some results have not been displayed. A complete set of results can be found in Results Review.   No results found.    Nivia Colon, PA-C 08/17/23 2018    Dreama Longs, MD 08/18/23 1016

## 2023-08-17 NOTE — H&P (Addendum)
 History and Physical    Patient: Kristen [name] FMW:[id number] DOB: [birth year] DOA: 08/17/2023 DOS: the patient was seen and examined on 08/17/2023 PCP: Sigrid Deidra Fox, MD  Patient coming from: Home   Chief Complaint:  Chief Complaint  Patient presents with   Hypertension   Headache   Neck Pain   HPI: Kristen [name] is a 59 y.o. 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  She has been experiencing a headache since 7 AM on the day of the visit, located at the front of her face, back of neck. She describes visual changes and photophobia, necessitating closing her eyes. No phonophobia is reported.  There is a history of urinary tract infections, and she is concerned about a possible recurrence. She reports increased urgency, but no dysuria.  She normally is on potassium supplementation, but has been out of it for about a week.  She reports some right leg pain when touched but denies any recent injury.  Abdominal pain or vomiting. She takes her medication via a G tube and is on a regular diet.  In the ED patient was noted to be afebrile with blood pressures elevated up to 180/110, and all other vital signs maintained.  Labs significant for potassium 2.5.  Urinalysis noted large leukocytes, many bacteria, and 21-50 WBCs.  Patient was ordered Rocephin  1 g IV, Benadryl  25 mg IV, Compazine  10 mg IV, and potassium chloride  20 mEq IV.   Review of Systems: As mentioned in the history of present illness. All other systems reviewed and are negative. Past Medical History:  Diagnosis Date   Allergy    Anemia    Iron deficiency   Anxiety    Arthritis    back, left shoulder   Blood transfusion without reported diagnosis    Chicken pox    Depression    Diarrhea    takes Imodium  daily   GERD (gastroesophageal reflux disease)     H/O hiatal hernia    Headache(784.0)    History of kidney stones    1996ish   History of radiation therapy 11/16/16- 01/01/17   Base of Tongue/ 66 gy in 33 fractions/ Dose: 2 Gy   Hypertension    Iron deficiency anemia 12/11/2018   Migraine    none for 5 years (as of 01/13/16)   OSA (obstructive sleep apnea) 10/13/2015   unable to get cpap, plans to get one in 2018   Pneumonia    Restless legs    Scoliosis    Shingles 08/28/2013   Shortness of breath    with exertion   Sleep apnea    Tongue cancer (HCC)    tongue cancer   Past Surgical History:  Procedure Laterality Date   BACK SURGERY  07/21/1978   CARPOMETACARPEL SUSPENSION PLASTY Left 01/21/2021   Procedure: Left Thumb Ligament Carpometacarpal Arthroplasty;  Surgeon: Romona Harari, MD;  Location: [facility] OR;  Service: Orthopedics;  Laterality: Left;   CHOLECYSTECTOMY N/A 08/15/2013   Procedure: LAPAROSCOPIC CHOLECYSTECTOMY WITH INTRAOPERATIVE CHOLANGIOGRAM;  Surgeon: Lynwood MALVA Pina, MD;  Location: [facility] OR;  Service: General;  Laterality: N/A;   COLONOSCOPY N/A 01/09/2013   Procedure: COLONOSCOPY;  Surgeon: Belvie JONETTA Just, MD;  Location: [facility] ENDOSCOPY;  Service: Endoscopy;  Laterality: N/A;   COLONOSCOPY  03/2018   DIRECT LARYNGOSCOPY  07/2016   Dr. Lauralee [facility]   ESOPHAGOGASTRODUODENOSCOPY  03/2018  ESOPHAGOGASTRODUODENOSCOPY (EGD) WITH PROPOFOL  N/A 04/24/2019   Procedure: ESOPHAGOGASTRODUODENOSCOPY (EGD) WITH PROPOFOL ;  Surgeon: Avram Lupita BRAVO, MD;  Location: [facility] ENDOSCOPY;  Service: Endoscopy;  Laterality: N/A;   GASTROSTOMY TUBE PLACEMENT  09/27/2016   GIVENS CAPSULE STUDY N/A 04/24/2019   Procedure: GIVENS CAPSULE STUDY;  Surgeon: Avram Lupita BRAVO, MD;  Location: [facility] ENDOSCOPY;  Service: Endoscopy;  Laterality: N/A;   HERNIA REPAIR Left 1981   IR PATIENT EVAL TECH 0-60 MINS  12/13/2016   IR PATIENT EVAL TECH 0-60 MINS  04/11/2017   IR PATIENT EVAL TECH 0-60 MINS  03/24/2018   IR PATIENT EVAL TECH 0-60 MINS  11/23/2018   IR PATIENT  EVAL TECH 0-60 MINS  05/28/2019   IR PATIENT EVAL TECH 0-60 MINS  12/17/2022   IR REPLACE G-TUBE SIMPLE WO FLUORO  12/12/2017   IR REPLACE G-TUBE SIMPLE WO FLUORO  03/29/2018   IR REPLACE G-TUBE SIMPLE WO FLUORO  06/29/2018   IR REPLACE G-TUBE SIMPLE WO FLUORO  11/02/2018   IR REPLACE G-TUBE SIMPLE WO FLUORO  03/08/2019   IR REPLACE G-TUBE SIMPLE WO FLUORO  07/02/2019   IR REPLACE G-TUBE SIMPLE WO FLUORO  11/30/2019   IR REPLACE G-TUBE SIMPLE WO FLUORO  12/27/2019   IR REPLACE G-TUBE SIMPLE WO FLUORO  05/27/2020   IR REPLACE G-TUBE SIMPLE WO FLUORO  07/09/2020   IR REPLACE G-TUBE SIMPLE WO FLUORO  10/23/2021   IR REPLACE G-TUBE SIMPLE WO FLUORO  08/19/2022   IR REPLACE G-TUBE SIMPLE WO FLUORO  07/13/2023   IR REPLC GASTRO/COLONIC TUBE PERCUT W/FLUORO  03/01/2022   MODIFIED RADICAL NECK DISSECTION Left 09/27/2016   Levels 1 & 2   PARTIAL GLOSSECTOMY Left 09/27/2016   Left hemi partial glossectomy   SPINE SURGERY  01/20/2016   fusion   TONSILLECTOMY     tracheotomy  09/27/2016   TUBAL LIGATION  06/1988   Social History:  reports that she has never smoked. She has never used smokeless tobacco. She reports current alcohol use of about 7.0 standard drinks of alcohol per week. She reports that she does not use drugs.  Allergies  Allergen Reactions   Sulfa Antibiotics Hives   Tomato Hives and Swelling    Family History  Problem Relation Age of Onset   Heart disease Father    Heart attack Father    Hypertension Mother    Arthritis Mother    Diabetes Maternal Grandmother    Diabetes Paternal Grandmother    Diabetes Maternal Uncle    Colon polyps Neg Hx    Colon cancer Neg Hx    Esophageal cancer Neg Hx    Stomach cancer Neg Hx    Rectal cancer Neg Hx     Prior to Admission medications   Medication Sig Start Date End Date Taking? Authorizing Provider  acetaminophen  (TYLENOL ) 500 MG tablet Take 2 tablets (1,000 mg total) by mouth every 8 (eight) hours as needed for moderate pain or mild  pain. Patient taking differently: Place 1,000 mg into feeding tube every 8 (eight) hours as needed for moderate pain (pain score 4-6) or mild pain (pain score 1-3). 04/21/22   Georgina Ozell LABOR, MD  AMBULATORY NON FORMULARY MEDICATION Jevity 1.5 Sig: 6 cans per day, Bolus feeding  60ml syringes #30  Tape and gauze 03/12/22   Avram Lupita BRAVO, MD  amLODipine  (NORVASC ) 10 MG tablet TAKE 1 TABLET BY MOUTH EVERY DAY Patient taking differently: Place 10 mg into feeding tube daily. 12/14/22   Mercer Kirsch  R, MD  aspirin  81 MG chewable tablet Place 81 mg into feeding tube daily.  10/05/16   [provider]  betamethasone  dipropionate 0.05 % cream Apply 1 Application topically 2 (two) times daily as needed (eczema). 04/06/23   [provider]  Calcium  Citrate 200 MG TABS Place 200 mg into feeding tube daily at 12 noon.    [provider]  cholecalciferol  (VITAMIN D3) 10 MCG (400 UNIT) TABS tablet Place 1 tablet (400 Units total) into feeding tube daily. 03/31/23   Avram Lupita BRAVO, MD  cyanocobalamin  (VITAMIN B12) 500 MCG tablet Take 1 tablet (500 mcg total) by mouth daily. 04/28/23   Samtani, Jai-Gurmukh, MD  dexlansoprazole  (DEXILANT ) 60 MG capsule ADMINISTER 1 CAPSULE BY MOUTH VIA FEEDING TUBE DAILY 03/31/23   Avram Lupita BRAVO, MD  diclofenac  (VOLTAREN ) 75 MG EC tablet TAKE 1 TABLET(75 MG) BY MOUTH TWICE DAILY WITH A MEAL 05/19/23   Georgina Ozell LABOR, MD  diphenoxylate -atropine  (LOMOTIL ) 2.5-0.025 MG tablet TAKE TWO TABLETS VIA TUBE TWICE DAILY 05/30/23   Avram Lupita BRAVO, MD  ferrous sulfate  220 [phone]) MG/5ML solution Take 6.8 ml via feeding tube daily 05/17/23   Avram Lupita BRAVO, MD  folic acid  (FOLVITE ) 400 MCG tablet Place 400 mcg into feeding tube daily.    [provider]  ketoconazole (NIZORAL) 2 % cream Apply 1 Application topically 2 (two) times daily as needed for irritation. 04/06/23   [provider]  lidocaine  (LMX) 4 % cream Apply 1 application  topically as  needed (to affected sites- for pain).    [provider]  methylPREDNISolone  (MEDROL  DOSEPAK) 4 MG TBPK tablet Take as prescribed on the box 07/14/23   Georgina Ozell LABOR, MD  naproxen  (NAPROSYN ) 500 MG tablet Take 500 mg by mouth every 12 (twelve) hours as needed for mild pain (pain score 1-3) or moderate pain (pain score 4-6). 03/30/23   [provider]  ondansetron  (ZOFRAN -ODT) 4 MG disintegrating tablet Take 1 tablet (4 mg total) by mouth every 8 (eight) hours as needed for nausea or vomiting. 08/24/21   Geiple, Joshua, PA-C  polyethylene glycol (MIRALAX  / GLYCOLAX ) 17 g packet Place 17 g into feeding tube daily as needed for moderate constipation. 04/28/23   Samtani, Jai-Gurmukh, MD  potassium chloride  (KLOR-CON  M) 10 MEQ tablet Take 10 mEq by mouth daily. 03/29/23   [provider]  sertraline  (ZOLOFT ) 25 MG tablet TAKE 1 TABLET BY MOUTH EVERY DAY Patient taking differently: Place 25 mg into feeding tube daily. 04/26/22   Mercer Clotilda SAUNDERS, MD  TERBINAFINE EX Apply 1 drop topically at bedtime. Terbinafine 1000mg  Dmso susp. Apply 1 drop to all toenails nightly at bedtime. 04/06/23   [provider]    Physical Exam: Vitals:   08/17/23 1252 08/17/23 1500 08/17/23 1530 08/17/23 1647  BP: (!) 149/102 130/89 (!) 153/100 (!) 141/98  Pulse: 67 66 70 65  Resp: 16   18  Temp:    98 F (36.7 C)  TempSrc:    Oral  SpO2: 97% 95% 95% 98%  Weight:      Height:       Constitutional: Elderly female currently in no acute distress Eyes: PERRL, lids and conjunctivae normal ENMT: Mucous membranes are moist.    Neck: normal, supple  Respiratory: clear to auscultation bilaterally, no wheezing, no crackles. Normal respiratory effort. No accessory muscle use.  Cardiovascular: Regular rate and rhythm, no murmurs / rubs / gallops.    Trace lower extremity edema. Abdomen:  no tenderness, no masses palpated.  PEG tube in place bowel sounds positive.  Musculoskeletal: no clubbing /  cyanosis.  Tenderness palpation of the right shin. Good ROM, no contractures. Normal muscle tone.  Skin: no rashes, lesions, ulcers. No induration Neurologic: CN 2-12 grossly intact. Strength 5/5 in all 4.  Psychiatric: Normal judgment and insight. Alert and oriented x 3. Normal mood.   Data Reviewed:  EKG revealed sinus rhythm at 60 bpm.  Reviewed labs, imaging, and pertinent records as documented. Assessment and Plan:  Urinary tract infection Present on admission.  Patient reported having some urgency, but denied dysuria.  Urinalysis noted large leukocytes, many bacteria, and 21-50 WBCs.  Urine culture was obtained.  Patient was started on empiric antibiotics Rocephin . - Admit to medical telemetry bed - Follow-up urine culture - Continue empiric antibiotics of Rocephin   Hypokalemia Acute.  Initial potassium noted to be 2.5.  Patient had been ordered 20 mEq of potassium chloride  IV.  Patient reports being out of her scheduled potassium supplementation for the last week. - Give 80 mEq of potassium chloride  per tube - Continue to monitor and replace as needed - Patient Will need potassium supplementation refilled at discharge  Headache Patient presented with complaints of frontal headache.  She had been given a headache cocktail with Benadryl  and Compazine  with improvement in symptoms.  Suspects symptoms could be related with patient's blood pressures being uncontrolled. - Headache cocktail/Fioricet  as needed  Essential hypertension Blood pressures initially elevated up to 180/110. 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 - Continue outpatient follow-up  Anxiety and depression - Continue sertraline   History of diarrhea - Continue Lomotil   DVT prophylaxis: Lovenox  Advance Care  Planning:   Code Status: Full Code  Consults: None  Family Communication: None  Severity of Illness: The appropriate patient status for this patient is OBSERVATION. Observation status is judged to be reasonable and necessary in order to provide the required intensity of service to ensure the patient's safety. The patient's presenting symptoms, physical exam findings, and initial radiographic and laboratory data in the context of their medical condition is felt to place them at decreased risk for further clinical deterioration. Furthermore, it is anticipated that the patient will be medically stable for discharge from the hospital within 2 midnights of admission.   Author: Maximino DELENA Sharps, MD 08/17/2023 8:13 PM  For on call review [email or web address].

## 2023-08-18 DIAGNOSIS — N3000 Acute cystitis without hematuria: Secondary | ICD-10-CM | POA: Diagnosis not present

## 2023-08-18 LAB — CBC
HCT: 39.6 % (ref 36.0–46.0)
Hemoglobin: 12.5 g/dL (ref 12.0–15.0)
MCH: 27.7 pg (ref 26.0–34.0)
MCHC: 31.6 g/dL (ref 30.0–36.0)
MCV: 87.6 fL (ref 80.0–100.0)
Platelets: 224 K/uL (ref 150–400)
RBC: 4.52 MIL/uL (ref 3.87–5.11)
RDW: 13.5 % (ref 11.5–15.5)
WBC: 5 K/uL (ref 4.0–10.5)
nRBC: 0 % (ref 0.0–0.2)

## 2023-08-18 LAB — BASIC METABOLIC PANEL WITH GFR
Anion gap: 11 (ref 5–15)
BUN: 5 mg/dL — ABNORMAL LOW (ref 6–20)
CO2: 24 mmol/L (ref 22–32)
Calcium: 9.2 mg/dL (ref 8.9–10.3)
Chloride: 108 mmol/L (ref 98–111)
Creatinine, Ser: <0.3 mg/dL — ABNORMAL LOW (ref 0.44–1.00)
Glucose, Bld: 95 mg/dL (ref 70–99)
Potassium: 4.1 mmol/L (ref 3.5–5.1)
Sodium: 143 mmol/L (ref 135–145)

## 2023-08-18 LAB — MAGNESIUM: Magnesium: 1.8 mg/dL (ref 1.7–2.4)

## 2023-08-18 MED ORDER — ORAL CARE MOUTH RINSE
15.0000 mL | OROMUCOSAL | Status: DC | PRN
Start: 2023-08-18 — End: 2023-08-20

## 2023-08-18 MED ORDER — MAGNESIUM SULFATE 2 GM/50ML IV SOLN
2.0000 g | Freq: Once | INTRAVENOUS | Status: AC
  Administered 2023-08-18: 2 g via INTRAVENOUS
  Filled 2023-08-18: qty 50

## 2023-08-18 NOTE — Progress Notes (Signed)
 PROGRESS NOTE    STAPHANIE HARBISON  FMW:[id number] DOB: 01-28-1965 DOA: 08/17/2023 PCP: Sigrid Deidra Fox, MD    Brief Narrative:   Kristen [name] is a 58 y.o. 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  Patient reports headache since 7 AM on day of visit, localized to the front of her face and back of her neck.  Describes changes of photophobia necessitating closing of her eyes, denies phonophobia.  Also describes increased urinary frequency without dysuria, known history of frequent UTIs.  Also states usually on potassium supplementation but has been out of it for roughly 1 week.  Denies abdominal pain, no nausea/vomiting.  She takes her medication via G-tube and on a regular diet at baseline.  In the ED, temperature 98.3 F, HR 74, RR 16, BP 164/104, SpO2 97% on room air.  WBC 4.0, hemoglobin 12.8, platelet count 224.  Sodium 142, potassium 2.5, chloride 104, CO2 26, glucose 100, BUN less than 5, creatinine 0.34.  AST 14, ALT 10, total bilirubin 0.8.  Urinalysis with large leukocytes, negative nitrite, many bacteria, 21-50 WBCs.  Urine culture obtained. Patient was ordered Rocephin  1 g IV, Benadryl  25 mg IV, Compazine  10 mg IV, and potassium chloride  20 mEq IV.  TRH consulted for admission  Assessment & Plan:   Urinary tract infection Patient reported having some urgency, but denied dysuria.  Urinalysis noted large leukocytes, many bacteria, and 21-50 WBCs.  Patient with prior history of Enterobacter, E. coli Klebsiella pneumonia UTI on review of EMR. -- Urine culture: Pending -- Ceftriaxone  1 g IV every 24 hours   Hypokalemia Potassium noted to be 2.5 at time of presentation.   Patient reports being out of her scheduled potassium supplementation for the last week.  Repleted with IV and oral potassium on admission. -- K 2.5>4.1 -- Repeat electrolytes in a.m. -- Will need potassium supplementation refilled at discharge   Headache Patient presented with complaints of frontal headache.  She had been given a headache cocktail with Benadryl  and Compazine  with improvement in symptoms.  Suspects symptoms could be related with patient's blood pressures being uncontrolled. -- Headache cocktail/Fioricet  as needed   Essential hypertension Blood pressures initially elevated up to 180/110 at time of admission.  BP improved, 133/96 this morning. 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 -- Continue outpatient follow-up   Anxiety and depression -- Continue sertraline  25 mg per tube daily   History of diarrhea -- Continue Lomotil  as needed loose stools/diarrhea  DVT prophylaxis: enoxaparin  (LOVENOX ) injection 40 mg Start: 08/17/23 2200    Code Status: Full Code Family Communication: No family present at bedside this morning  Disposition Plan:  Level of care: Telemetry Status is: Observation The patient remains OBS appropriate and will d/c before 2 midnights.    Consultants:  None  Procedures:  None  Antimicrobials:  Ceftriaxone  7/30>>   Subjective: Patient seen examined bedside, lying in bed.  No specific complaints this morning.  Headache resolved, denies any increased urinary frequency today.  Awaiting for urine culture results.  Remains on IV antibiotics.  Denies headache, no dizziness, no  vision changes, no chest pain, no palpitations, no shortness of breath, no abdominal pain, no fever/chills/night sweats, no nausea/vomiting/diarrhea, no focal weakness, no fatigue, no paresthesia.  No acute events overnight per nursing  staff.  Objective: Vitals:   08/17/23 2036 08/17/23 2220 08/18/23 0255 08/18/23 0615  BP:  (!) 154/100 (!) 133/92 (!) 133/96  Pulse:  69 86 66  Resp:  16 16 16   Temp: 98 F (36.7 C) 97.9 F (36.6 C) 98.1 F (36.7 C) 98.3 F (36.8 C)  TempSrc: Oral     SpO2:  97% 97% 97%  Weight:      Height:        Intake/Output Summary (Last 24 hours) at 08/18/2023 1149 Last data filed at 08/18/2023 0400 Gross per 24 hour  Intake 564.1 ml  Output --  Net 564.1 ml   Filed Weights   08/17/23 1045  Weight: 86.6 kg    Examination:  Physical Exam: GEN: NAD, alert and oriented x 3, wd/wn HEENT: NCAT, PERRL, EOMI, sclera clear, MMM PULM: CTAB w/o wheezes/crackles, normal respiratory effort, on room air CV: RRR w/o M/G/R GI: abd soft, NTND, + BS; PEG tube noted MSK: no peripheral edema, moves all extremities independently NEURO: No focal neurological deficit PSYCH: normal mood/affect Integumentary: No concerning rashes/lesions/wounds noted on exposed skin surfaces    Data Reviewed: I have personally reviewed following labs and imaging studies  CBC: Recent Labs  Lab 08/17/23 1536 08/18/23 0018  WBC 4.0 5.0  NEUTROABS 2.4  --   HGB 12.8 12.5  HCT 40.1 39.6  MCV 86.2 87.6  PLT 224 224   Basic Metabolic Panel: Recent Labs  Lab 08/17/23 1536 08/18/23 0018  NA 142 143  K 2.5* 4.1  CL 104 108  CO2 26 24  GLUCOSE 100* 95  BUN <5* 5*  CREATININE 0.34* <0.30*  CALCIUM  9.4 9.2  MG  --  1.8   GFR: CrCl cannot be calculated (This lab value cannot be used to calculate CrCl because it is not a number: <0.30). Liver Function Tests: Recent Labs  Lab 08/17/23 1536  AST 14*  ALT 10  ALKPHOS 174*  BILITOT 0.8  PROT 7.8  ALBUMIN  3.9   No results for input(s): LIPASE, AMYLASE in the last 168 hours. No results for input(s): AMMONIA in the last 168 hours. Coagulation Profile: No results for input(s): INR, PROTIME in the last 168 hours. Cardiac Enzymes: No results  for input(s): CKTOTAL, CKMB, CKMBINDEX, TROPONINI in the last 168 hours. BNP (last 3 results) No results for input(s): PROBNP in the last 8760 hours. HbA1C: No results for input(s): HGBA1C in the last 72 hours. CBG: No results for input(s): GLUCAP in the last 168 hours. Lipid Profile: No results for input(s): CHOL, HDL, LDLCALC, TRIG, CHOLHDL, LDLDIRECT in the last 72 hours. Thyroid  Function Tests: No results for input(s): TSH, T4TOTAL, FREET4, T3FREE, THYROIDAB in the last 72 hours. Anemia Panel: No results for input(s): VITAMINB12, FOLATE, FERRITIN, TIBC, IRON, RETICCTPCT in the last 72 hours. Sepsis Labs: No results for input(s): PROCALCITON, LATICACIDVEN in the last 168 hours.  No results found for this or any previous visit (from the past 240 hours).       Radiology Studies: No results found.      Scheduled Meds:  amLODipine   10 mg Per Tube Daily   enoxaparin  (LOVENOX ) injection  40 mg Subcutaneous Q24H   famotidine   20 mg Per Tube BID   sertraline   25 mg Per Tube Daily   Continuous  Infusions:  cefTRIAXone  (ROCEPHIN )  IV Stopped (08/17/23 2200)     LOS: 0 days    Time spent: 51 minutes spent on 08/18/2023 caring for this patient face-to-face including chart review, ordering labs/tests, documenting, discussion with nursing staff, consultants, updating family and interview/physical exam    Camellia PARAS [place], DO [facility] Available via Epic secure chat 7am-7pm After these hours, please refer to coverage provider listed on [email or web address] 08/18/2023, 11:49 AM

## 2023-08-18 NOTE — Plan of Care (Signed)

## 2023-08-18 NOTE — TOC Initial Note (Addendum)
 Transition of Care [facility]) - Initial/Assessment Note    Patient Details  Name: Kristen [name] MRN: [id number] Date of Birth: 14-Oct-1965  Transition of Care [facility]) CM/SW Contact:    Sonda Manuella Quill, RN Phone Number: 08/18/2023, 10:23 AM  Clinical Narrative:                 Spoke w/ pt in room; pt says she lives at home w/ her mother; she plans to return at d/c; pt identified POC Bette Lukes ([place]) [phone]; family will provide transportation; pt verified insurance/PCP; she has difficulty paying for food and medications; pt does not have DME or HH services; pt agreed to receive resources for social services and financial assistance; resources placed in d/c instructions; copy of resources also given to pt; she will make her own appt w/ agencies of choice; TOC is following.  Expected Discharge Plan: Home/Self Care Barriers to Discharge: Continued Medical Work up   Patient Goals and CMS Choice Patient states their goals for this hospitalization and ongoing recovery are:: home CMS Medicare.gov Compare Post Acute Care list provided to:: Patient   [place] ownership interest in [facility].provided to:: Patient    Expected Discharge Plan and Services   Discharge Planning Services: CM Consult   Living arrangements for the past 2 months: Single Family Home                                      Prior Living Arrangements/Services Living arrangements for the past 2 months: Single Family Home Lives with:: Parents Patient language and need for interpreter reviewed:: Yes Do you feel safe going back to the place where you live?: Yes      Need for Family Participation in Patient Care: Yes (Comment) Care giver support system in place?: Yes (comment) Current home services:  (n/a) Criminal Activity/Legal Involvement Pertinent to Current Situation/Hospitalization: No - Comment as needed  Activities of Daily Living   ADL Screening (condition at time of  admission) Independently performs ADLs?: Yes (appropriate for developmental age) Is the patient deaf or have difficulty hearing?: No Does the patient have difficulty seeing, even when wearing glasses/contacts?: No Does the patient have difficulty concentrating, remembering, or making decisions?: No  Permission Sought/Granted Permission sought to share information with : Case Manager Permission granted to share information with : Yes, Verbal Permission Granted  Share Information with NAME: Case Manager     Permission granted to share info w Relationship: Bette Lukes ([place]) [phone]     Emotional Assessment Appearance:: Appears stated age Attitude/Demeanor/Rapport: Gracious Affect (typically observed): Accepting Orientation: : Oriented to Self, Oriented to Place, Oriented to  Time, Oriented to Situation Alcohol / Substance Use: Not Applicable Psych Involvement: No (comment)  Admission diagnosis:  Hypokalemia [E87.6] Urinary tract infection [N39.0] Acute lower UTI [N39.0] Patient Active Problem List   Diagnosis Date Noted   Urinary tract infection 08/17/2023   Hypokalemia 08/17/2023   S/P percutaneous endoscopic gastrostomy (PEG) tube placement (HCC) 08/17/2023   Acute encephalopathy 04/22/2023   Enteritis 01/12/2023   Generalized weakness 07/18/2022   Dehydration 07/18/2022   Tension headache 07/18/2022   Depression 07/18/2022   History of tongue cancer 07/18/2022   PEG tube malfunction (HCC) 02/28/2022   Protein-calorie malnutrition, severe 04/29/2021   Pseudoarthrosis of lumbar spine 04/28/2021   Fusion of spine of lumbar region 04/28/2021   Pseudarthrosis after fusion or arthrodesis 04/20/2021    Class:  Chronic   Tenosynovitis, de Quervain 12/14/2020   Arthritis of carpometacarpal [facility]) joint of left thumb 12/14/2020   Aortic atherosclerosis (HCC) 08/20/2020   Hyperkalemia 07/24/2020   Alcohol use 08/05/2019   Bilateral lower extremity edema 08/05/2019    Peripheral edema 03/12/2019   Iron deficiency anemia 12/11/2018   Loosening of hardware in spine [facility])    Other spondylosis with radiculopathy, lumbar region    History of lumbar spinal fusion 11/06/2018   UTI (urinary tract infection) 05/15/2017   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   Malignant neoplasm of base of tongue (HCC) 10/29/2016   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   Headache 05/14/2016   Tongue lesion 05/14/2016   Thrombocytopenia (HCC) 01/22/2016   Chest pain    Essential hypertension    Spinal stenosis of lumbar region 01/20/2016    Class: Chronic   DDD (degenerative disc disease), lumbar 01/20/2016    Class: Chronic   Spinal stenosis of lumbar region with neurogenic claudication 01/20/2016   Low back pain 12/22/2015   Hypersomnolence 10/13/2015   Muscle cramp 08/01/2015   GERD (gastroesophageal reflux disease) 07/09/2015   Anemia of chronic disease 01/08/2013   PCP:  Sigrid Deidra Fox, MD Pharmacy:   [id number] - [place], [place] - [address] AT [facility] OF [address] [place] [address] Phone: [phone] Fax: [phone]     Social Drivers of Health (SDOH) Social History: SDOH Screenings   Food Insecurity: Food Insecurity Present (08/18/2023)  Housing: Low Risk  (08/18/2023)  Transportation Needs: No Transportation Needs (08/18/2023)  Utilities: Not At Risk (08/18/2023)  Alcohol Screen: Low Risk  (06/04/2021)  Depression (PHQ2-9): Medium Risk (08/28/2021)  Financial Resource Strain: Medium Risk (06/04/2021)  Physical Activity: Insufficiently Active (06/04/2021)  Social Connections: Moderately Integrated (06/04/2021)  Recent Concern: Social Connections - Moderately Isolated (03/31/2021)  Stress: No Stress Concern Present (06/04/2021)  Recent Concern: Stress - Stress Concern Present (03/31/2021)  Tobacco Use: Low Risk  (08/17/2023)   SDOH  Interventions: Food Insecurity Interventions: [employer] Provided, Inpatient TOC Housing Interventions: Intervention Not Indicated, Inpatient TOC Transportation Interventions: Intervention Not Indicated, Inpatient TOC Utilities Interventions: Intervention Not Indicated, Inpatient TOC   Readmission Risk Interventions     No data to display

## 2023-08-18 NOTE — Therapy (Incomplete)
 OUTPATIENT PHYSICAL THERAPY CERVICAL EVALUATION   Patient Name: Kristen [name] MRN: [id number] DOB:09/20/1965, 58 y.o., female Today's Date: 08/18/2023  END OF SESSION:     Past Medical History:  Diagnosis Date   Allergy    Anemia    Iron deficiency   Anxiety    Arthritis    back, left shoulder   Blood transfusion without reported diagnosis    Chicken pox    Depression    Diarrhea    takes Imodium  daily   GERD (gastroesophageal reflux disease)    H/O hiatal hernia    Headache(784.0)    History of kidney stones    1996ish   History of radiation therapy 11/16/16- 01/01/17   Base of Tongue/ 66 gy in 33 fractions/ Dose: 2 Gy   Hypertension    Iron deficiency anemia 12/11/2018   Migraine    none for 5 years (as of 01/13/16)   OSA (obstructive sleep apnea) 10/13/2015   unable to get cpap, plans to get one in 2018   Pneumonia    Restless legs    Scoliosis    Shingles 08/28/2013   Shortness of breath    with exertion   Sleep apnea    Tongue cancer (HCC)    tongue cancer   Past Surgical History:  Procedure Laterality Date   BACK SURGERY  07/21/1978   CARPOMETACARPEL SUSPENSION PLASTY Left 01/21/2021   Procedure: Left Thumb Ligament Carpometacarpal Arthroplasty;  Surgeon: Romona Harari, MD;  Location: [facility] OR;  Service: Orthopedics;  Laterality: Left;   CHOLECYSTECTOMY N/A 08/15/2013   Procedure: LAPAROSCOPIC CHOLECYSTECTOMY WITH INTRAOPERATIVE CHOLANGIOGRAM;  Surgeon: Lynwood MALVA Pina, MD;  Location: [facility] OR;  Service: General;  Laterality: N/A;   COLONOSCOPY N/A 01/09/2013   Procedure: COLONOSCOPY;  Surgeon: Belvie JONETTA Just, MD;  Location: [facility] ENDOSCOPY;  Service: Endoscopy;  Laterality: N/A;   COLONOSCOPY  03/2018   DIRECT LARYNGOSCOPY  07/2016   Dr. Lauralee [facility]   ESOPHAGOGASTRODUODENOSCOPY  03/2018   ESOPHAGOGASTRODUODENOSCOPY (EGD) WITH PROPOFOL  N/A 04/24/2019   Procedure: ESOPHAGOGASTRODUODENOSCOPY (EGD) WITH PROPOFOL ;  Surgeon: Avram Lupita BRAVO, MD;  Location: [facility]  ENDOSCOPY;  Service: Endoscopy;  Laterality: N/A;   GASTROSTOMY TUBE PLACEMENT  09/27/2016   GIVENS CAPSULE STUDY N/A 04/24/2019   Procedure: GIVENS CAPSULE STUDY;  Surgeon: Avram Lupita BRAVO, MD;  Location: [facility] ENDOSCOPY;  Service: Endoscopy;  Laterality: N/A;   HERNIA REPAIR Left 1981   IR PATIENT EVAL TECH 0-60 MINS  12/13/2016   IR PATIENT EVAL TECH 0-60 MINS  04/11/2017   IR PATIENT EVAL TECH 0-60 MINS  03/24/2018   IR PATIENT EVAL TECH 0-60 MINS  11/23/2018   IR PATIENT EVAL TECH 0-60 MINS  05/28/2019   IR PATIENT EVAL TECH 0-60 MINS  12/17/2022   IR REPLACE G-TUBE SIMPLE WO FLUORO  12/12/2017   IR REPLACE G-TUBE SIMPLE WO FLUORO  03/29/2018   IR REPLACE G-TUBE SIMPLE WO FLUORO  06/29/2018   IR REPLACE G-TUBE SIMPLE WO FLUORO  11/02/2018   IR REPLACE G-TUBE SIMPLE WO FLUORO  03/08/2019   IR REPLACE G-TUBE SIMPLE WO FLUORO  07/02/2019   IR REPLACE G-TUBE SIMPLE WO FLUORO  11/30/2019   IR REPLACE G-TUBE SIMPLE WO FLUORO  12/27/2019   IR REPLACE G-TUBE SIMPLE WO FLUORO  05/27/2020   IR REPLACE G-TUBE SIMPLE WO FLUORO  07/09/2020   IR REPLACE G-TUBE SIMPLE WO FLUORO  10/23/2021   IR REPLACE G-TUBE SIMPLE WO FLUORO  08/19/2022   IR REPLACE G-TUBE SIMPLE WO FLUORO  07/13/2023   IR REPLC GASTRO/COLONIC TUBE PERCUT W/FLUORO  03/01/2022   MODIFIED RADICAL NECK DISSECTION Left 09/27/2016   Levels 1 & 2   PARTIAL GLOSSECTOMY Left 09/27/2016   Left hemi partial glossectomy   SPINE SURGERY  01/20/2016   fusion   TONSILLECTOMY     tracheotomy  09/27/2016   TUBAL LIGATION  06/1988   Patient Active Problem List   Diagnosis Date Noted   Urinary tract infection 08/17/2023   Hypokalemia 08/17/2023   S/P percutaneous endoscopic gastrostomy (PEG) tube placement (HCC) 08/17/2023   Acute encephalopathy 04/22/2023   Enteritis 01/12/2023   Generalized weakness 07/18/2022   Dehydration 07/18/2022   Tension headache 07/18/2022   Depression 07/18/2022   History of tongue cancer 07/18/2022   PEG tube malfunction  (HCC) 02/28/2022   Protein-calorie malnutrition, severe 04/29/2021   Pseudoarthrosis of lumbar spine 04/28/2021   Fusion of spine of lumbar region 04/28/2021   Pseudarthrosis after fusion or arthrodesis 04/20/2021    Class: Chronic   Tenosynovitis, de Quervain 12/14/2020   Arthritis of carpometacarpal ([facility]) joint of left thumb 12/14/2020   Aortic atherosclerosis (HCC) 08/20/2020   Hyperkalemia 07/24/2020   Alcohol use 08/05/2019   Bilateral lower extremity edema 08/05/2019   Peripheral edema 03/12/2019   Iron deficiency anemia 12/11/2018   Loosening of hardware in spine [facility])    Other spondylosis with radiculopathy, lumbar region    History of lumbar spinal fusion 11/06/2018   UTI (urinary tract infection) 05/15/2017   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   Malignant neoplasm of base of tongue (HCC) 10/29/2016   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   Headache 05/14/2016   Tongue lesion 05/14/2016   Thrombocytopenia (HCC) 01/22/2016   Chest pain    Essential hypertension    Spinal stenosis of lumbar region 01/20/2016    Class: Chronic   DDD (degenerative disc disease), lumbar 01/20/2016    Class: Chronic   Spinal stenosis of lumbar region with neurogenic claudication 01/20/2016   Low back pain 12/22/2015   Hypersomnolence 10/13/2015   Muscle cramp 08/01/2015   GERD (gastroesophageal reflux disease) 07/09/2015   Anemia of chronic disease 01/08/2013    PCP: Sigrid Deidra Fox MD   REFERRING PROVIDER: Georgina Ozell LABOR, MD  REFERRING DIAG: Diagnosis M54.12 (ICD-10-CM) - Radiculopathy, cervical region  THERAPY DIAG:  No diagnosis found.  Rationale for Evaluation and Treatment: Rehabilitation  ONSET DATE: about 2-3 months ago   SUBJECTIVE:  SUBJECTIVE STATEMENT: ***  Rocked grandson yesterday and is having pain in Rt shoulder, Rt LE went numb, and is currently having a massive headache causing eye pain and cervical muscle pain. The leg has changed to a dull ache instead of completely numb.   Hand dominance: Right  PERTINENT HISTORY:  Usually my pain starts when I first get up in the morning, especially L shoulder but sometimes it can go down to both hands. Sometimes it eases off, sometimes it doesn't. Sometimes laying down and getting mind off of it can help, sometimes when it hurts there's no getting away from it. Holding 30# grand child can be difficult with LUE, have to switch sides a lot. Steroids helped. When my arm hurts, my whole body gets off including my BP  See above   PAIN:  Are you having pain? Yes: NPRS scale: 3/10 Pain location: mostly in neck, not as much in L shoulder today  Pain description: tightness, sharp throbbing pains  Aggravating factors: not sure  Relieving factors: unclear, sometimes laying down helps, sometimes nothing helps   PRECAUTIONS: Activity as tolerated, no spine specific precautions per MD note; still has G-tube   RED FLAGS: None     WEIGHT BEARING RESTRICTIONS: No  FALLS:  Has patient fallen in last 6 months? Yes. Number of falls 2- was sick at the time, sometimes feels off balance now but no falls when healthy   LIVING ENVIRONMENT: Lives with: lives with their family Lives in: House/apartment   OCCUPATION: not working/on disability   PLOF: Independent, Independent with basic ADLs, Independent with gait, and Independent with transfers  PATIENT GOALS: address pain in neck and shoulders, avoid surgery   NEXT MD VISIT: Referring 08/25/23  OBJECTIVE:  Note: Objective measures were completed at Evaluation unless otherwise noted.  DIAGNOSTIC FINDINGS:  Imaging: XRs of the lumbar spine from 03/08/2023 were previously independently reviewed and interpreted, showing  scoliosis with apex to the left at T12.  Chronically broken or cut rod seen at the lower thoracic spine on the right side.  Posterior instrumentation from L1-S1.  No lucency seen around the posterior fixation.  Interbody devices seen at L3/4, L4/5, and L5/S1.  No lucency seen around the interbody devices.  Devices appear in appropriate position.  No fracture or dislocation seen.  No evidence of instability on flexion/extension views.   XRs of the cervical spine from 07/14/2023 were independently reviewed and interpreted, showing disc height loss at C5/6 with anterior osteophyte formation. No other significant degenerative changes. Surgical staples along the left side of the neck anterior to the cervical spine.  No evidence of instability on flexion/extension views.  No fracture dislocation seen.  PATIENT SURVEYS:   THE PATIENT SPECIFIC FUNCTIONAL SCALE  Place score of 0-10 (0 = unable to perform activity and 10 = able to perform activity at the same level as before injury or problem)  Activity Date: 08/05/23 Eval     Laying on L side  5    2. Lifting >10# 5    3. Holding grandchild  5    4.      Total Score 5      Total Score = Sum of activity scores/number of activities  Minimally Detectable Change: 3 points (for single activity); 2 points (for average score)  [facility] (nd). The Patient Specific Functional Scale . Retrieved from [email or web address]   COGNITION: Overall cognitive status: Within functional limits for tasks assessed  SENSATION: Intermittent numbness in arms/hands sometimes, not  at time of eval   POSTURE: rounded shoulders, forward head, decreased lumbar lordosis, and decreased thoracic kyphosis  PALPATION:  very tight and tender B upper traps (more notable trigger points and spasms noted L UT), R thoracic paraspinals very tender but L OK      CERVICAL ROM:   Active ROM A/PROM (deg) eval  Flexion  25% limited   Extension 40% limited   Right lateral flexion 50% limited more painful   Left lateral flexion 50% limited not as bad as right   Right rotation 25% limited   Left rotation 50% limited    (Blank rows = not tested)    Thoracic AROM: flexion 75% limited, extension extreme limitation/barely mobile; lateral flexion very limited, compensates from lumbar side bending; rotation 50% limited B   UPPER EXTREMITY ROM:  Active ROM Right eval Left eval  Shoulder flexion 90* 80*  Shoulder extension    Shoulder abduction 100* 100*  Shoulder adduction    Shoulder extension    Shoulder internal rotation T12  T12   Shoulder external rotation C7  Upper trap   Elbow flexion    Elbow extension    Wrist flexion    Wrist extension    Wrist ulnar deviation    Wrist radial deviation    Wrist pronation    Wrist supination     (Blank rows = not tested)  UPPER EXTREMITY MMT:  MMT Right eval Left eval  Shoulder flexion 4- 3-  Shoulder extension    Shoulder abduction 3+ 3-  Shoulder adduction    Shoulder extension    Shoulder internal rotation 4- 3+  Shoulder external rotation 4- 3+  Middle trapezius    Lower trapezius    Elbow flexion    Elbow extension    Wrist flexion    Wrist extension    Wrist ulnar deviation    Wrist radial deviation    Wrist pronation    Wrist supination    Grip strength     (Blank rows = not tested)    TREATMENT DATE:  08/19/2023 ***  08/17/2023: Self-Care:  Education provided on high blood pressure and causes for high blood pressure (specifically pain), radiating pain from neck into shoulder, and radiating pain into L leg Blood pressure assessment  0943: Rt arm, sitting, automatic cuff, 156/98 0946: Rt arm, sitting, automatic cuff, 147/109 0950: Rt arm, sitting, manual cuff, 148/100 0954: Lt arm, sitting, manual cuff, 180/110   08/05/23  Eval, POC, HEP practice and education as below                                                                                                                                  PATIENT EDUCATION:  Education details: exam findings, POC, HEP  Person educated: Patient Education method: Explanation, Demonstration, and Handouts Education comprehension: verbalized understanding, returned demonstration, and needs further education  HOME EXERCISE PROGRAM: Access Code: [email or web address] URL: [email or web address] Date: 08/05/2023  Prepared by: Josette Rough  Exercises - Seated Upper Trapezius Stretch  - 2 x daily - 7 x weekly - 1 sets - 6 reps - 30 seconds  hold - Gentle Levator Scapulae Stretch  - 2 x daily - 7 x weekly - 1 sets - 6 reps - Seated Scapular Retraction  - 2 x daily - 7 x weekly - 1 sets - 10 reps - 3 seconds  hold - Seated Backward Shoulder Rolls  - 2 x daily - 7 x weekly - 1 sets - 10 reps - Seated Cervical Retraction  - 2 x daily - 7 x weekly - 1 sets - 10 reps - 3 seconds  hold  ASSESSMENT:  CLINICAL IMPRESSION: ***  Patient arrived to session this date noting massive headache with eye pain, neck pain, L shoulder pain, and L radiating leg pain that started as complete numbness this morning and evolved to a dull ache. PT assessed BP which began as borderline, but continued to rise until final assessment measure 180/110. PT educated patient on high blood pressure, symptoms associated with high blood pressure, and advised patient to go to ED. Patient endorses compliance with blood pressure medication. PT walked patient out to car for safety purposes, educated patient's mother on the situation and advised patient's mother to take patient to ED. Once medically cleared to return to PT, patient will benefit from skilled PT.  OBJECTIVE IMPAIRMENTS: decreased ROM, decreased strength, hypomobility, increased fascial restrictions, increased muscle spasms, impaired flexibility, impaired sensation, impaired UE functional use, improper body mechanics, postural dysfunction, and pain.    ACTIVITY LIMITATIONS: carrying, lifting, dressing, self feeding, reach over head, hygiene/grooming, and caring for others  PARTICIPATION LIMITATIONS: meal prep, cleaning, laundry, driving, shopping, community activity, and yard work  PERSONAL FACTORS: Age, Behavior pattern, Education, Fitness, Past/current experiences, Social background, and Time since onset of injury/illness/exacerbation are also affecting patient's functional outcome.   REHAB POTENTIAL: Fair chronicity and severity of pain  CLINICAL DECISION MAKING: Stable/uncomplicated  EVALUATION COMPLEXITY: Low   GOALS: Goals reviewed with patient? No  SHORT TERM GOALS: Target date: 09/02/2023    Will be compliant with appropriate progressive HEP  Baseline:  Goal status: INITIAL  2.  Will demonstrate improved posture with all functional tasks  Baseline:  Goal status: INITIAL  3.  Intensity and frequency of pain in L UE to have improved by 25%  Baseline:  Goal status: INITIAL  4. Cervical and thoracic ROM to be no more than 25% limited all planes of motion Baseline:  Goal status: INITIAL  5. Shoulder flexion and ABD AROM to have improved by 30*, FIR and FER AROM to have improved by 2 spinal levels  Baseline:  Goal status: INITIAL     LONG TERM GOALS: Target date: 09/30/2023    MMT to have improved by one grade in all weak groups  Baseline:  Goal status: INITIAL  2.  Intensity and frequency of pain in L UE to have improved by at least 50% Baseline:  Goal status: INITIAL  3.  Will be able to pick up at least 10# without increased pain or difficulty  Baseline:  Goal status: INITIAL  4.  Will be able to hold grand child in L UE for at least 10 min without difficulty  Baseline:  Goal status: INITIAL  5.  Pain in neck and B shoulders to be no more than 5/10 at worst  Baseline:  Goal status: INITIAL  6.  PSFS to have improved by at  least 2 points  Baseline:  Goal status: INITIAL   PLAN:  PT  FREQUENCY: 2x/week  PT DURATION: 8 weeks  PLANNED INTERVENTIONS: 97750- Physical Performance Testing, 97110-Therapeutic exercises, 97530- Therapeutic activity, [id number]- Neuromuscular re-education, 97535- Self Care, 02859- Manual therapy, and 97012- Traction (mechanical)  PLAN FOR NEXT SESSION: *** assess BP, general cervical and thoracic mobility, postural retraining and UE strengthening within pain limits. Could consider traction with close monitoring of sx response   Susannah Daring, PT, DPT 08/18/23 11:12 AM   Referring diagnosis? M54.12 Treatment diagnosis? (if different than referring diagnosis) M54.2, M62.81, R29.3, M25.511, M25.512, R29.898 What was this (referring dx) caused by? []  Surgery []  Fall [x]  Ongoing issue []  Arthritis []  Other: ____________  Laterality: []  Rt []  Lt [x]  Both  Check all possible CPT codes:  *CHOOSE 10 OR LESS*    See Planned Interventions listed in the Plan section of the Evaluation.

## 2023-08-18 NOTE — Care Management Obs Status (Signed)
 MEDICARE OBSERVATION STATUS NOTIFICATION   Patient Details  Name: Kristen [name] MRN: [id number] Date of Birth: 11-21-1965   Medicare Observation Status Notification Given:  Yes    Sonda Manuella Quill, RN 08/18/2023, 9:38 AM

## 2023-08-18 NOTE — Plan of Care (Signed)
  Problem: Clinical Measurements: Goal: Ability to maintain clinical measurements within normal limits will improve Outcome: Progressing Goal: Will remain free from infection Outcome: Progressing Goal: Diagnostic test results will improve Outcome: Progressing   Problem: Activity: Goal: Risk for activity intolerance will decrease Outcome: Progressing   Problem: Elimination: Goal: Will not experience complications related to urinary retention Outcome: Progressing

## 2023-08-18 NOTE — Plan of Care (Signed)
  Problem: Education: Goal: Knowledge of General Education information will improve Description: Including pain rating scale, medication(s)/side effects and non-pharmacologic comfort measures Outcome: Progressing   Problem: Clinical Measurements: Goal: Will remain free from infection Outcome: Progressing Goal: Diagnostic test results will improve Outcome: Progressing   Problem: Nutrition: Goal: Adequate nutrition will be maintained Outcome: Progressing   Problem: Coping: Goal: Level of anxiety will decrease Outcome: Progressing   Problem: Elimination: Goal: Will not experience complications related to bowel motility Outcome: Progressing Goal: Will not experience complications related to urinary retention Outcome: Progressing   Problem: Pain Managment: Goal: General experience of comfort will improve and/or be controlled Outcome: Progressing   Problem: Skin Integrity: Goal: Risk for impaired skin integrity will decrease Outcome: Progressing

## 2023-08-19 ENCOUNTER — Encounter

## 2023-08-19 DIAGNOSIS — N3000 Acute cystitis without hematuria: Secondary | ICD-10-CM | POA: Diagnosis not present

## 2023-08-19 LAB — BASIC METABOLIC PANEL WITH GFR
Anion gap: 12 (ref 5–15)
BUN: 9 mg/dL (ref 6–20)
CO2: 21 mmol/L — ABNORMAL LOW (ref 22–32)
Calcium: 9.2 mg/dL (ref 8.9–10.3)
Chloride: 107 mmol/L (ref 98–111)
Creatinine, Ser: 0.71 mg/dL (ref 0.44–1.00)
GFR, Estimated: >60 mL/min (ref >60)
Glucose, Bld: 89 mg/dL (ref 70–99)
Potassium: 3.5 mmol/L (ref 3.5–5.1)
Sodium: 140 mmol/L (ref 135–145)

## 2023-08-19 LAB — MAGNESIUM: Magnesium: 2.3 mg/dL (ref 1.7–2.4)

## 2023-08-19 MED ORDER — DICLOFENAC SODIUM 1 % EX GEL
2.0000 g | Freq: Four times a day (QID) | CUTANEOUS | Status: DC
  Administered 2023-08-19 – 2023-08-20 (×5): 2 g via TOPICAL
  Filled 2023-08-19: qty 100

## 2023-08-19 MED ORDER — POTASSIUM CHLORIDE CRYS ER 20 MEQ PO TBCR
40.0000 meq | EXTENDED_RELEASE_TABLET | Freq: Once | ORAL | Status: AC
  Administered 2023-08-19: 40 meq via ORAL
  Filled 2023-08-19: qty 2

## 2023-08-19 MED ORDER — BACLOFEN 10 MG PO TABS
10.0000 mg | ORAL_TABLET | Freq: Three times a day (TID) | ORAL | Status: DC | PRN
  Administered 2023-08-19 – 2023-08-20 (×3): 10 mg via ORAL
  Filled 2023-08-19 (×3): qty 1

## 2023-08-19 MED ORDER — ORAL CARE MOUTH RINSE: 15.0000 mL | OROMUCOSAL | Status: DC | PRN

## 2023-08-19 MED ORDER — NAPROXEN 250 MG PO TABS
250.0000 mg | ORAL_TABLET | Freq: Once | ORAL | Status: AC
  Administered 2023-08-19: 250 mg via ORAL
  Filled 2023-08-19: qty 1

## 2023-08-19 NOTE — Progress Notes (Signed)
 PROGRESS NOTE    NGUYEN BUTLER  FMW:[id number] DOB: 25-Nov-1965 DOA: 08/17/2023 PCP: Sigrid Deidra Fox, MD    Brief Narrative:   Kristen [name] is a 58 y.o. 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  Patient reports headache since 7 AM on day of visit, localized to the front of her face and back of her neck.  Describes changes of photophobia necessitating closing of her eyes, denies phonophobia.  Also describes increased urinary frequency without dysuria, known history of frequent UTIs.  Also states usually on potassium supplementation but has been out of it for roughly 1 week.  Denies abdominal pain, no nausea/vomiting.  She takes her medication via G-tube and on a regular diet at baseline.  In the ED, temperature 98.3 F, HR 74, RR 16, BP 164/104, SpO2 97% on room air.  WBC 4.0, hemoglobin 12.8, platelet count 224.  Sodium 142, potassium 2.5, chloride 104, CO2 26, glucose 100, BUN less than 5, creatinine 0.34.  AST 14, ALT 10, total bilirubin 0.8.  Urinalysis with large leukocytes, negative nitrite, many bacteria, 21-50 WBCs.  Urine culture obtained. Patient was ordered Rocephin  1 g IV, Benadryl  25 mg IV, Compazine  10 mg IV, and potassium chloride  20 mEq IV.  TRH consulted for admission  Assessment & Plan:   Klebsiella pneumonia urinary tract infection Patient reported having some urgency, but denied dysuria.  Urinalysis noted large leukocytes, many bacteria, and 21-50 WBCs.  Patient with prior history of Enterobacter, E. coli Klebsiella pneumonia UTI on review of EMR. -- Urine culture: > 100K Klebsiella pneumonia; susceptibilities pending -- Ceftriaxone  1 g IV every 24 hours    Hypokalemia Potassium noted to be 2.5 at time of presentation.  Patient reports being out of her scheduled potassium supplementation for the last week.  Repleted with IV and oral potassium on admission. -- K 2.5>4.1>3.5 -- Repeat electrolytes in a.m. -- Will need potassium supplementation refilled at discharge   Headache Patient presented with complaints of frontal headache.  She had been given a headache cocktail with Benadryl  and Compazine  with improvement in symptoms.  Suspects symptoms could be related with patient's blood pressures being uncontrolled. -- Headache cocktail/Fioricet  as needed   Essential hypertension Blood pressures initially elevated up to 180/110 at time of admission.  BP improved, 133/96 this morning. 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 -- Continue outpatient follow-up   Anxiety and depression -- Continue sertraline  25 mg per tube daily   History of diarrhea -- Continue Lomotil  as needed loose stools/diarrhea  DVT prophylaxis: enoxaparin  (LOVENOX ) injection 40 mg Start: 08/17/23 2200    Code Status: Full Code Family Communication: No family present at bedside this morning  Disposition Plan:  Level of care: Telemetry Status is: Observation The patient remains OBS appropriate and will d/c before 2 midnights.    Consultants:  None  Procedures:  None  Antimicrobials:  Ceftriaxone  7/30>>   Subjective: Patient seen examined bedside, lying in bed.  No specific complaints this morning.  Urine culture now growing Klebsiella pneumoniae, awaiting susceptibilities.  Denies headache, no dizziness, no vision changes, no chest pain,  no palpitations, no shortness of breath, no abdominal pain, no fever/chills/night sweats, no nausea/vomiting/diarrhea, no focal weakness, no fatigue, no paresthesia.  No acute events overnight per  nursing staff.  Objective: Vitals:   08/18/23 1356 08/18/23 1945 08/19/23 0415 08/19/23 1134  BP: 122/84 (!) 146/97 (!) 135/95 129/83  Pulse: 77 76 72 73  Resp: 18 16 16    Temp: 98.4 F (36.9 C) 98.7 F (37.1 C) 98.1 F (36.7 C) 98.4 F (36.9 C)  TempSrc:    Oral  SpO2: 97% 97% 98% 96%  Weight:      Height:        Intake/Output Summary (Last 24 hours) at 08/19/2023 1200 Last data filed at 08/19/2023 0910 Gross per 24 hour  Intake 816 ml  Output --  Net 816 ml   Filed Weights   08/17/23 1045  Weight: 86.6 kg    Examination:  Physical Exam: GEN: NAD, alert and oriented x 3, wd/wn HEENT: NCAT, PERRL, EOMI, sclera clear, MMM PULM: CTAB w/o wheezes/crackles, normal respiratory effort, on room air CV: RRR w/o M/G/R GI: abd soft, NTND, + BS; PEG tube noted MSK: no peripheral edema, moves all extremities independently NEURO: No focal neurological deficit PSYCH: normal mood/affect Integumentary: No concerning rashes/lesions/wounds noted on exposed skin surfaces    Data Reviewed: I have personally reviewed following labs and imaging studies  CBC: Recent Labs  Lab 08/17/23 1536 08/18/23 0018  WBC 4.0 5.0  NEUTROABS 2.4  --   HGB 12.8 12.5  HCT 40.1 39.6  MCV 86.2 87.6  PLT 224 224   Basic Metabolic Panel: Recent Labs  Lab 08/17/23 1536 08/18/23 0018 08/19/23 0518  NA 142 143 140  K 2.5* 4.1 3.5  CL 104 108 107  CO2 26 24 21*  GLUCOSE 100* 95 89  BUN <5* 5* 9  CREATININE 0.34* <0.30* 0.71  CALCIUM  9.4 9.2 9.2  MG  --  1.8 2.3   GFR: Estimated Creatinine Clearance: 86.6 mL/min (by C-G formula based on SCr of 0.71 mg/dL). Liver Function Tests: Recent Labs  Lab 08/17/23 1536  AST 14*  ALT 10  ALKPHOS 174*  BILITOT 0.8  PROT 7.8  ALBUMIN  3.9   No results for input(s): LIPASE, AMYLASE in the last 168 hours. No results for input(s): AMMONIA in the last 168 hours. Coagulation Profile: No results for input(s): INR, PROTIME in the last 168  hours. Cardiac Enzymes: No results for input(s): CKTOTAL, CKMB, CKMBINDEX, TROPONINI in the last 168 hours. BNP (last 3 results) No results for input(s): PROBNP in the last 8760 hours. HbA1C: No results for input(s): HGBA1C in the last 72 hours. CBG: No results for input(s): GLUCAP in the last 168 hours. Lipid Profile: No results for input(s): CHOL, HDL, LDLCALC, TRIG, CHOLHDL, LDLDIRECT in the last 72 hours. Thyroid  Function Tests: No results for input(s): TSH, T4TOTAL, FREET4, T3FREE, THYROIDAB in the last 72 hours. Anemia Panel: No results for input(s): VITAMINB12, FOLATE, FERRITIN, TIBC, IRON, RETICCTPCT in the last 72 hours. Sepsis Labs: No results for input(s): PROCALCITON, LATICACIDVEN in the last 168 hours.  Recent Results (from the past 240 hours)  Urine Culture     Status: Abnormal (Preliminary result)   Collection Time: 08/17/23  1:15 PM   Specimen: Urine, Catheterized  Result Value Ref Range Status   Specimen Description   Final    URINE, CATHETERIZED Performed at [facility], [address], [place], [place] [address]    Special Requests   Final  NONE Performed at [facility], [address], [place], [place] [address]    Culture (A)  Final    >=100,000 COLONIES/mL KLEBSIELLA PNEUMONIAE SUSCEPTIBILITIES TO FOLLOW Performed at [facility], [address] [place], [place] [address]    Report Status PENDING  Incomplete         Radiology Studies: No results found.      Scheduled Meds:  amLODipine   10 mg Per Tube Daily   diclofenac  Sodium  2 g Topical QID   enoxaparin  (LOVENOX ) injection  40 mg Subcutaneous Q24H   famotidine   20 mg Per Tube BID   sertraline   25 mg Per Tube Daily   Continuous Infusions:  cefTRIAXone  (ROCEPHIN )  IV Stopped (08/18/23 2118)     LOS: 0 days    Time spent: 48 minutes spent on 08/19/2023 caring for this patient  face-to-face including chart review, ordering labs/tests, documenting, discussion with nursing staff, consultants, updating family and interview/physical exam    Camellia PARAS [place], DO [facility] Available via Epic secure chat 7am-7pm After these hours, please refer to coverage provider listed on [email or web address] 08/19/2023, 12:00 PM

## 2023-08-20 DIAGNOSIS — N3000 Acute cystitis without hematuria: Secondary | ICD-10-CM | POA: Diagnosis not present

## 2023-08-20 LAB — URINE CULTURE: Culture: >=100000 — AB

## 2023-08-20 MED ORDER — CEPHALEXIN 500 MG PO CAPS: 500.0000 mg | ORAL_CAPSULE | Freq: Two times a day (BID) | ORAL | 0 refills | Status: AC

## 2023-08-20 MED ORDER — POTASSIUM CHLORIDE 20 MEQ PO PACK: 20.0000 meq | PACK | Freq: Every day | ORAL | 0 refills | Status: AC

## 2023-08-20 MED ORDER — AMLODIPINE BESYLATE 10 MG PO TABS: 10.0000 mg | ORAL_TABLET | Freq: Every day | ORAL | 0 refills | Status: AC

## 2023-08-20 MED ORDER — BUTALBITAL-APAP-CAFFEINE 50-325-40 MG PO TABS: 1.0000 | ORAL_TABLET | Freq: Four times a day (QID) | ORAL | 0 refills | Status: AC | PRN

## 2023-08-20 NOTE — TOC Transition Note (Signed)
 Transition of Care [facility]) - Discharge Note   Patient Details  Name: Kristen [name] MRN: [id number] Date of Birth: 08-24-1965  Transition of Care [facility]) CM/SW Contact:  Sonda Manuella Quill, RN Phone Number: 08/20/2023, 11:53 AM   Clinical Narrative:    D/C orders received; no TOC needs.   Final next level of care: Home/Self Care Barriers to Discharge: No Barriers Identified   Patient Goals and CMS Choice Patient states their goals for this hospitalization and ongoing recovery are:: home CMS Medicare.gov Compare Post Acute Care list provided to:: Patient   [place] ownership interest in [facility].provided to:: Patient    Discharge Placement                       Discharge Plan and Services Additional resources added to the After Visit Summary for     Discharge Planning Services: CM Consult                                 Social Drivers of Health (SDOH) Interventions SDOH Screenings   Food Insecurity: Food Insecurity Present (08/18/2023)  Housing: Low Risk  (08/18/2023)  Transportation Needs: No Transportation Needs (08/18/2023)  Utilities: Not At Risk (08/18/2023)  Alcohol Screen: Low Risk  (06/04/2021)  Depression (PHQ2-9): Medium Risk (08/28/2021)  Financial Resource Strain: Medium Risk (06/04/2021)  Physical Activity: Insufficiently Active (06/04/2021)  Social Connections: Moderately Integrated (06/04/2021)  Recent Concern: Social Connections - Moderately Isolated (03/31/2021)  Stress: No Stress Concern Present (06/04/2021)  Recent Concern: Stress - Stress Concern Present (03/31/2021)  Tobacco Use: Low Risk  (08/17/2023)     Readmission Risk Interventions     No data to display

## 2023-08-20 NOTE — Plan of Care (Signed)
  Problem: Education: Goal: Knowledge of General Education information will improve Description: Including pain rating scale, medication(s)/side effects and non-pharmacologic comfort measures Outcome: Progressing   Problem: Health Behavior/Discharge Planning: Goal: Ability to manage health-related needs will improve Outcome: Progressing   Problem: Clinical Measurements: Goal: Ability to maintain clinical measurements within normal limits will improve Outcome: Progressing Goal: Diagnostic test results will improve Outcome: Progressing Goal: Respiratory complications will improve Outcome: Progressing Goal: Cardiovascular complication will be avoided Outcome: Progressing   Problem: Activity: Goal: Risk for activity intolerance will decrease Outcome: Progressing   Problem: Nutrition: Goal: Adequate nutrition will be maintained Outcome: Progressing   Problem: Coping: Goal: Level of anxiety will decrease Outcome: Progressing   Problem: Elimination: Goal: Will not experience complications related to bowel motility Outcome: Progressing Goal: Will not experience complications related to urinary retention Outcome: Progressing   Problem: Pain Managment: Goal: General experience of comfort will improve and/or be controlled Outcome: Progressing   Problem: Safety: Goal: Ability to remain free from injury will improve Outcome: Progressing   Problem: Skin Integrity: Goal: Risk for impaired skin integrity will decrease Outcome: Progressing   Problem: Clinical Measurements: Goal: Will remain free from infection Outcome: Not Progressing

## 2023-08-20 NOTE — Discharge Summary (Signed)
 Physician Discharge Summary  KYANA AICHER FMW:[id number] DOB: 1965-12-20 DOA: 08/17/2023  PCP: Sigrid Deidra Fox, MD  Admit date: 08/17/2023 Discharge date: 08/20/2023  Admitted From: Home Disposition: Home  Recommendations for Outpatient Follow-up:  Follow up with PCP in 1-2 weeks Refills amlodipine  and potassium supplement Continue Keflex  to complete 7-day course for Klebsiella pneumonia urinary tract infection  Home Health: No Equipment/Devices: None  Discharge Condition: Stable CODE STATUS: Full code Diet recommendation: Regular diet; administers all medications via PEG tube  History of present illness:  Kristen [name] is a 58 y.o. 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  Patient reports headache since 7 AM on day of visit, localized to the front of her face and back of her neck.  Describes changes of photophobia necessitating closing of her eyes, denies phonophobia.  Also describes increased urinary frequency without dysuria, known history of frequent UTIs.  Also states usually on potassium supplementation but has been out of it for roughly 1 week.  Denies abdominal pain, no nausea/vomiting.  She takes her medication via G-tube and on a regular diet at baseline.   In the ED, temperature 98.3 F, HR 74, RR 16, BP 164/104, SpO2 97% on room air.  WBC 4.0, hemoglobin 12.8, platelet count 224.  Sodium 142, potassium 2.5, chloride 104, CO2 26, glucose 100, BUN less than 5, creatinine 0.34.  AST 14, ALT 10, total bilirubin 0.8.  Urinalysis with large leukocytes, negative nitrite, many bacteria, 21-50 WBCs.  Urine culture obtained. Patient was ordered Rocephin  1 g IV,  Benadryl  25 mg IV, Compazine  10 mg IV, and potassium chloride  20 mEq IV.  TRH consulted for admission  Hospital course:  Klebsiella pneumonia urinary tract infection Patient reported having some urgency, but denied dysuria.  Urinalysis noted large leukocytes, many bacteria, and 21-50 WBCs.  Patient with prior history of Enterobacter, E. coli Klebsiella pneumonia UTI on review of EMR.  Patient was started on IV ceftriaxone .  Urine culture positive for Klebsiella pneumonia and will discharge on Keflex  500 mg per tube twice daily to complete 7-day course in accordance with urine culture susceptibilities.   Hypokalemia Potassium noted to be 2.5 at time of presentation.  Patient reports being out of her scheduled potassium supplementation for the last week.  Repleted with IV and oral potassium on admission.  Home potassium supplement refilled at time of discharge.   Headache Patient presented with complaints of frontal headache.  She had been given a headache cocktail with Benadryl  and Compazine  with improvement in symptoms.  Suspects symptoms could be related with patient's blood pressures being uncontrolled. Fioricet  as needed   Essential hypertension Blood pressures initially elevated up to 180/110 at time of admission.  Restarted on amlodipine  with improvement of blood pressure.  Continue amlodipine  10 mg per tube daily.   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 Continue outpatient follow-up   Anxiety and depression Continue sertraline  25 mg per tube daily   History of diarrhea Continue Lomotil  as needed loose stools/diarrhea  Discharge Diagnoses:  Principal Problem:   Urinary tract infection Active Problems:   Hypokalemia   Headache   Essential hypertension   Malignant neoplasm of base of tongue (HCC)  S/P percutaneous endoscopic gastrostomy (PEG) tube placement [facility])    Discharge Instructions  Discharge Instructions      Call MD for:  difficulty breathing, headache or visual disturbances   Complete by: As directed    Call MD for:  extreme fatigue   Complete by: As directed    Call MD for:  persistant dizziness or light-headedness   Complete by: As directed    Call MD for:  persistant nausea and vomiting   Complete by: As directed    Call MD for:  severe uncontrolled pain   Complete by: As directed    Call MD for:  temperature >100.4   Complete by: As directed    Diet - low sodium heart healthy   Complete by: As directed    Increase activity slowly   Complete by: As directed       Allergies as of 08/20/2023       Reactions   Sulfa Antibiotics Hives   Tomato Hives, Swelling        Medication List     STOP taking these medications    potassium chloride  10 MEQ tablet Commonly known as: KLOR-CON  M       TAKE these medications    acetaminophen  500 MG tablet Commonly known as: TYLENOL  Take 2 tablets (1,000 mg total) by mouth every 8 (eight) hours as needed for moderate pain or mild pain. What changed: how to take this   AMBULATORY NON FORMULARY MEDICATION Jevity 1.5 Sig: 6 cans per day, Bolus feeding  60ml syringes #30  Tape and gauze   amLODipine  10 MG tablet Commonly known as: NORVASC  Place 1 tablet (10 mg total) into feeding tube daily.   aspirin  81 MG chewable tablet Place 81 mg into feeding tube daily.   baclofen  10 MG tablet Commonly known as: LIORESAL  Take 10 mg by mouth 2 (two) times daily.   betamethasone  dipropionate 0.05 % cream Apply 1 Application topically 2 (two) times daily as needed (eczema).   butalbital -acetaminophen -caffeine  50-325-40 MG tablet Commonly known as: FIORICET  Place 1 tablet into feeding tube every 6 (six) hours as needed for headache.   Calcium  Citrate 200 MG Tabs Place 200 mg into feeding tube daily at 12 noon.   cephALEXin  500 MG capsule Commonly known as: KEFLEX  Place 1 capsule (500 mg total) into feeding tube 2 (two) times  daily for 4 days.   cholecalciferol  10 MCG (400 UNIT) Tabs tablet Commonly known as: VITAMIN D3 Place 1 tablet (400 Units total) into feeding tube daily.   cyanocobalamin  500 MCG tablet Commonly known as: VITAMIN B12 Take 1 tablet (500 mcg total) by mouth daily. What changed: how to take this   dexlansoprazole  60 MG capsule Commonly known as: DEXILANT  ADMINISTER 1 CAPSULE BY MOUTH VIA FEEDING TUBE DAILY What changed:  how much to take how to take this when to take this additional instructions   diclofenac  75 MG EC tablet Commonly known as: VOLTAREN  TAKE 1 TABLET(75 MG) BY MOUTH TWICE DAILY WITH A MEAL What changed: See the new instructions.   diphenoxylate -atropine  2.5-0.025 MG tablet Commonly known as: LOMOTIL  TAKE TWO TABLETS VIA TUBE TWICE DAILY What changed:  how much to take how to take this when to take this additional instructions   ferrous sulfate  220 (44 Fe) MG/5ML solution Take 6.8 ml via feeding tube daily   folic acid  400 MCG tablet Commonly known as: FOLVITE  Place 400 mcg into feeding tube daily.   ketoconazole 2 % cream Commonly known as:  NIZORAL Apply 1 Application topically 2 (two) times daily as needed for irritation.   lidocaine  4 % cream Commonly known as: LMX Apply 1 application  topically as needed (to affected sites- for pain).   loratadine  10 MG tablet Commonly known as: CLARITIN  Place 10 mg into feeding tube daily.   naproxen  500 MG tablet Commonly known as: NAPROSYN  Take 500 mg by mouth every 12 (twelve) hours as needed for mild pain (pain score 1-3) or moderate pain (pain score 4-6).   ondansetron  4 MG disintegrating tablet Commonly known as: ZOFRAN -ODT Take 1 tablet (4 mg total) by mouth every 8 (eight) hours as needed for nausea or vomiting.   polyethylene glycol 17 g packet Commonly known as: MIRALAX  / GLYCOLAX  Place 17 g into feeding tube daily as needed for moderate constipation.   potassium chloride  20 MEQ  packet Commonly known as: KLOR-CON  Place 20 mEq into feeding tube daily.   sertraline  25 MG tablet Commonly known as: ZOLOFT  TAKE 1 TABLET BY MOUTH EVERY DAY What changed:  how much to take how to take this when to take this additional instructions   TERBINAFINE EX Apply 1 drop topically at bedtime. Terbinafine 1000mg  Dmso susp. Apply 1 drop to all toenails nightly at bedtime.        Follow-up Information     Sigrid Kays, [place], MD. Schedule an appointment as soon as possible for a visit in 1 week(s).   Specialty: Family Medicine Contact information: [address] [place] [address] [phone]                Allergies  Allergen Reactions   Sulfa Antibiotics Hives   Tomato Hives and Swelling    Consultations: None   Procedures/Studies: No results found.   Subjective: Patient seen examined bedside, lying in bed.  No complaints this morning.  Urine culture susceptibilities now back.  Discharging home.  Denies headache, no dizziness, no chest pain, no palpitations, no shortness of breath, no abdominal pain, no fever/chills/night sweats, no nausea/vomiting/diarrhea, no focal weakness, no fatigue, no paresthesias.  No acute events overnight per nursing staff.  Discharge Exam: Vitals:   08/20/23 0443 08/20/23 0841  BP: (!) 124/95 (!) 124/95  Pulse: 72   Resp: 16   Temp: 98.3 F (36.8 C)   SpO2: 96%    Vitals:   08/19/23 1134 08/19/23 1919 08/20/23 0443 08/20/23 0841  BP: 129/83 (!) 130/93 (!) 124/95 (!) 124/95  Pulse: 73 77 72   Resp:  15 16   Temp: 98.4 F (36.9 C) 98.4 F (36.9 C) 98.3 F (36.8 C)   TempSrc: Oral Oral    SpO2: 96% 97% 96%   Weight:      Height:        Physical Exam: GEN: NAD, alert and oriented x 3, wd/wn HEENT: NCAT, PERRL, EOMI, sclera clear, MMM PULM: CTAB w/o wheezes/crackles, normal respiratory effort, on room air CV: RRR w/o M/G/R GI: abd soft, NTND, + BS; PEG tube noted MSK: no peripheral edema,  moves all extremities independently NEURO: No focal neurological deficit PSYCH: normal mood/affect Integumentary: No concerning rashes/lesions/wounds noted on exposed skin surfaces    The results of significant diagnostics from this hospitalization (including imaging, microbiology, ancillary and laboratory) are listed below for reference.     Microbiology: Recent Results (from the past 240 hours)  Urine Culture     Status: Abnormal   Collection Time: 08/17/23  1:15 PM   Specimen: Urine, Catheterized  Result Value Ref  Range Status   Specimen Description   Final    URINE, CATHETERIZED Performed at [facility], [address] [place], [place] [address]    Special Requests   Final    NONE Performed at [facility], [address] [place], [place] [address]    Culture >=100,000 COLONIES/mL KLEBSIELLA PNEUMONIAE (A)  Final   Report Status 08/20/2023 FINAL  Final   Organism ID, Bacteria KLEBSIELLA PNEUMONIAE (A)  Final      Susceptibility   Klebsiella pneumoniae - MIC*    AMPICILLIN >=32 RESISTANT Resistant     CEFAZOLIN  <=4 SENSITIVE Sensitive     CEFEPIME  <=0.12 SENSITIVE Sensitive     CEFTRIAXONE  <=0.25 SENSITIVE Sensitive     CIPROFLOXACIN  <=0.25 SENSITIVE Sensitive     GENTAMICIN <=1 SENSITIVE Sensitive     IMIPENEM <=0.25 SENSITIVE Sensitive     NITROFURANTOIN  128 RESISTANT Resistant     TRIMETH/SULFA <=20 SENSITIVE Sensitive     AMPICILLIN/SULBACTAM 8 SENSITIVE Sensitive     PIP/TAZO <=4 SENSITIVE Sensitive ug/mL    * >=100,000 COLONIES/mL KLEBSIELLA PNEUMONIAE     Labs: BNP (last 3 results) Recent Labs    04/22/23 0900  BNP 11.4   Basic Metabolic Panel: Recent Labs  Lab 08/17/23 1536 08/18/23 0018 08/19/23 0518  NA 142 143 140  K 2.5* 4.1 3.5  CL 104 108 107  CO2 26 24 21*  GLUCOSE 100* 95 89  BUN <5* 5* 9  CREATININE 0.34* <0.30* 0.71  CALCIUM  9.4 9.2 9.2  MG  --  1.8 2.3   Liver Function Tests: Recent Labs   Lab 08/17/23 1536  AST 14*  ALT 10  ALKPHOS 174*  BILITOT 0.8  PROT 7.8  ALBUMIN  3.9   No results for input(s): LIPASE, AMYLASE in the last 168 hours. No results for input(s): AMMONIA in the last 168 hours. CBC: Recent Labs  Lab 08/17/23 1536 08/18/23 0018  WBC 4.0 5.0  NEUTROABS 2.4  --   HGB 12.8 12.5  HCT 40.1 39.6  MCV 86.2 87.6  PLT 224 224   Cardiac Enzymes: No results for input(s): CKTOTAL, CKMB, CKMBINDEX, TROPONINI in the last 168 hours. BNP: Invalid input(s): POCBNP CBG: No results for input(s): GLUCAP in the last 168 hours. D-Dimer No results for input(s): DDIMER in the last 72 hours. Hgb A1c No results for input(s): HGBA1C in the last 72 hours. Lipid Profile No results for input(s): CHOL, HDL, LDLCALC, TRIG, CHOLHDL, LDLDIRECT in the last 72 hours. Thyroid  function studies No results for input(s): TSH, T4TOTAL, T3FREE, THYROIDAB in the last 72 hours.  Invalid input(s): FREET3 Anemia work up No results for input(s): VITAMINB12, FOLATE, FERRITIN, TIBC, IRON, RETICCTPCT in the last 72 hours. Urinalysis    Component Value Date/Time   COLORURINE YELLOW 08/17/2023 1315   APPEARANCEUR HAZY (A) 08/17/2023 1315   LABSPEC 1.004 (L) 08/17/2023 1315   PHURINE 6.0 08/17/2023 1315   GLUCOSEU NEGATIVE 08/17/2023 1315   HGBUR SMALL (A) 08/17/2023 1315   BILIRUBINUR NEGATIVE 08/17/2023 1315   BILIRUBINUR negative 01/13/2021 0803   KETONESUR NEGATIVE 08/17/2023 1315   PROTEINUR NEGATIVE 08/17/2023 1315   UROBILINOGEN 0.2 01/13/2021 0803   UROBILINOGEN 1.0 01/08/2013 0912   NITRITE NEGATIVE 08/17/2023 1315   LEUKOCYTESUR LARGE (A) 08/17/2023 1315   Sepsis Labs Recent Labs  Lab 08/17/23 1536 08/18/23 0018  WBC 4.0 5.0   Microbiology Recent Results (from the past 240 hours)  Urine Culture     Status: Abnormal  Collection Time: 08/17/23  1:15 PM   Specimen: Urine, Catheterized  Result Value Ref  Range Status   Specimen Description   Final    URINE, CATHETERIZED Performed at [facility], [address], [place], [place] [address]    Special Requests   Final    NONE Performed at [facility], [address] [place], [place] [address]    Culture >=100,000 COLONIES/mL KLEBSIELLA PNEUMONIAE (A)  Final   Report Status 08/20/2023 FINAL  Final   Organism ID, Bacteria KLEBSIELLA PNEUMONIAE (A)  Final      Susceptibility   Klebsiella pneumoniae - MIC*    AMPICILLIN >=32 RESISTANT Resistant     CEFAZOLIN  <=4 SENSITIVE Sensitive     CEFEPIME  <=0.12 SENSITIVE Sensitive     CEFTRIAXONE  <=0.25 SENSITIVE Sensitive     CIPROFLOXACIN  <=0.25 SENSITIVE Sensitive     GENTAMICIN <=1 SENSITIVE Sensitive     IMIPENEM <=0.25 SENSITIVE Sensitive     NITROFURANTOIN  128 RESISTANT Resistant     TRIMETH/SULFA <=20 SENSITIVE Sensitive     AMPICILLIN/SULBACTAM 8 SENSITIVE Sensitive     PIP/TAZO <=4 SENSITIVE Sensitive ug/mL    * >=100,000 COLONIES/mL KLEBSIELLA PNEUMONIAE     Time coordinating discharge: Over 30 minutes  SIGNED:   Camellia PARAS [place], DO  [facility] 08/20/2023, 10:38 AM

## 2023-08-20 NOTE — Progress Notes (Signed)
 AVS reviewed with patient at the bedside. All questions answered, and patient verbalized understanding. IV removed per order without complications. Patient is to be discharged home via vehicle. Patient escorted to main entrance via WC by staff.

## 2023-08-22 NOTE — Therapy (Signed)
 OUTPATIENT PHYSICAL THERAPY CERVICAL TREATMENT  Patient Name: Kristen [name] MRN: [id number] DOB:Dec 29, 1965, 58 y.o., female Today's Date: 08/23/2023  END OF SESSION:  PT End of Session - 08/23/23 1129     Visit Number 3    Number of Visits 17    Date for PT Re-Evaluation 09/30/23    Authorization Type [employer]    Authorization Time Period 08/05/23 to 09/30/23    PT Start Time 0946    PT Stop Time 1026    PT Time Calculation (min) 40 min    Activity Tolerance Patient tolerated treatment well    Behavior During Therapy [facility] for tasks assessed/performed            Past Medical History:  Diagnosis Date   Allergy    Anemia    Iron deficiency   Anxiety    Arthritis    back, left shoulder   Blood transfusion without reported diagnosis    Chicken pox    Depression    Diarrhea    takes Imodium  daily   GERD (gastroesophageal reflux disease)    H/O hiatal hernia    Headache(784.0)    History of kidney stones    1996ish   History of radiation therapy 11/16/16- 01/01/17   Base of Tongue/ 66 gy in 33 fractions/ Dose: 2 Gy   Hypertension    Iron deficiency anemia 12/11/2018   Migraine    none for 5 years (as of 01/13/16)   OSA (obstructive sleep apnea) 10/13/2015   unable to get cpap, plans to get one in 2018   Pneumonia    Restless legs    Scoliosis    Shingles 08/28/2013   Shortness of breath    with exertion   Sleep apnea    Tongue cancer (HCC)    tongue cancer   Past Surgical History:  Procedure Laterality Date   BACK SURGERY  07/21/1978   CARPOMETACARPEL SUSPENSION PLASTY Left 01/21/2021   Procedure: Left Thumb Ligament Carpometacarpal Arthroplasty;  Surgeon: Romona Harari, MD;  Location: [facility] OR;  Service: Orthopedics;  Laterality: Left;   CHOLECYSTECTOMY N/A 08/15/2013   Procedure: LAPAROSCOPIC CHOLECYSTECTOMY WITH INTRAOPERATIVE CHOLANGIOGRAM;  Surgeon: Lynwood MALVA Pina, MD;  Location: [facility] OR;  Service: General;  Laterality: N/A;   COLONOSCOPY N/A 01/09/2013    Procedure: COLONOSCOPY;  Surgeon: Belvie JONETTA Just, MD;  Location: [facility] ENDOSCOPY;  Service: Endoscopy;  Laterality: N/A;   COLONOSCOPY  03/2018   DIRECT LARYNGOSCOPY  07/2016   [facility]   ESOPHAGOGASTRODUODENOSCOPY  03/2018   ESOPHAGOGASTRODUODENOSCOPY (EGD) WITH PROPOFOL  N/A 04/24/2019   Procedure: ESOPHAGOGASTRODUODENOSCOPY (EGD) WITH PROPOFOL ;  Surgeon: Avram Lupita BRAVO, MD;  Location: [facility] ENDOSCOPY;  Service: Endoscopy;  Laterality: N/A;   GASTROSTOMY TUBE PLACEMENT  09/27/2016   GIVENS CAPSULE STUDY N/A 04/24/2019   Procedure: GIVENS CAPSULE STUDY;  Surgeon: Avram Lupita BRAVO, MD;  Location: [facility] ENDOSCOPY;  Service: Endoscopy;  Laterality: N/A;   HERNIA REPAIR Left 1981   IR PATIENT EVAL TECH 0-60 MINS  12/13/2016   IR PATIENT EVAL TECH 0-60 MINS  04/11/2017   IR PATIENT EVAL TECH 0-60 MINS  03/24/2018   IR PATIENT EVAL TECH 0-60 MINS  11/23/2018   IR PATIENT EVAL TECH 0-60 MINS  05/28/2019   IR PATIENT EVAL TECH 0-60 MINS  12/17/2022   IR REPLACE G-TUBE SIMPLE WO FLUORO  12/12/2017   IR REPLACE G-TUBE SIMPLE WO FLUORO  03/29/2018   IR REPLACE G-TUBE SIMPLE WO FLUORO  06/29/2018   IR REPLACE G-TUBE  SIMPLE WO FLUORO  11/02/2018   IR REPLACE G-TUBE SIMPLE WO FLUORO  03/08/2019   IR REPLACE G-TUBE SIMPLE WO FLUORO  07/02/2019   IR REPLACE G-TUBE SIMPLE WO FLUORO  11/30/2019   IR REPLACE G-TUBE SIMPLE WO FLUORO  12/27/2019   IR REPLACE G-TUBE SIMPLE WO FLUORO  05/27/2020   IR REPLACE G-TUBE SIMPLE WO FLUORO  07/09/2020   IR REPLACE G-TUBE SIMPLE WO FLUORO  10/23/2021   IR REPLACE G-TUBE SIMPLE WO FLUORO  08/19/2022   IR REPLACE G-TUBE SIMPLE WO FLUORO  07/13/2023   IR REPLC GASTRO/COLONIC TUBE PERCUT W/FLUORO  03/01/2022   MODIFIED RADICAL NECK DISSECTION Left 09/27/2016   Levels 1 & 2   PARTIAL GLOSSECTOMY Left 09/27/2016   Left hemi partial glossectomy   SPINE SURGERY  01/20/2016   fusion   TONSILLECTOMY     tracheotomy  09/27/2016   TUBAL LIGATION  06/1988   Patient Active Problem List    Diagnosis Date Noted   Urinary tract infection 08/17/2023   Hypokalemia 08/17/2023   S/P percutaneous endoscopic gastrostomy (PEG) tube placement (HCC) 08/17/2023   Acute encephalopathy 04/22/2023   Enteritis 01/12/2023   Generalized weakness 07/18/2022   Dehydration 07/18/2022   Tension headache 07/18/2022   Depression 07/18/2022   History of tongue cancer 07/18/2022   PEG tube malfunction (HCC) 02/28/2022   Protein-calorie malnutrition, severe 04/29/2021   Pseudoarthrosis of lumbar spine 04/28/2021   Fusion of spine of lumbar region 04/28/2021   Pseudarthrosis after fusion or arthrodesis 04/20/2021    Class: Chronic   Tenosynovitis, de Quervain 12/14/2020   Arthritis of carpometacarpal (CMC) joint of left thumb 12/14/2020   Aortic atherosclerosis (HCC) 08/20/2020   Hyperkalemia 07/24/2020   Alcohol use 08/05/2019   Bilateral lower extremity edema 08/05/2019   Peripheral edema 03/12/2019   Iron deficiency anemia 12/11/2018   Loosening of hardware in spine (HCC)    Other spondylosis with radiculopathy, lumbar region    History of lumbar spinal fusion 11/06/2018   UTI (urinary tract infection) 05/15/2017   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   Malignant neoplasm of base of tongue (HCC) 10/29/2016   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   Headache 05/14/2016   Tongue lesion 05/14/2016   Thrombocytopenia (HCC) 01/22/2016   Chest pain    Essential hypertension    Spinal stenosis of lumbar region 01/20/2016    Class: Chronic   DDD (degenerative disc disease), lumbar 01/20/2016    Class: Chronic   Spinal stenosis of lumbar region with neurogenic claudication 01/20/2016   Low back pain 12/22/2015   Hypersomnolence 10/13/2015   Muscle cramp 08/01/2015   GERD (gastroesophageal reflux disease) 07/09/2015   Anemia of chronic disease 01/08/2013    PCP: Sigrid Deidra Fox MD   REFERRING PROVIDER: Georgina Ozell LABOR, MD  REFERRING DIAG:  Diagnosis M54.12 (ICD-10-CM) - Radiculopathy, cervical region  THERAPY DIAG:  Cervicalgia  Muscle weakness (generalized)  Abnormal posture  Bilateral shoulder pain, unspecified chronicity  Rationale for Evaluation and Treatment: Rehabilitation  ONSET DATE: about 2-3 months ago   SUBJECTIVE:  SUBJECTIVE STATEMENT: Pt states she went to the ED on Wednesday after being referred by our department.  She was in the hospital Wednesday and discharged on Saturday.      Feeling much better. Still on medication for the UTI and for the headaches.       Seeing PCP today.  Hand dominance: Right  PERTINENT HISTORY:  Usually my pain starts when I first get up in the morning, especially L shoulder but sometimes it can go down to both hands. Sometimes it eases off, sometimes it doesn't. Sometimes laying down and getting mind off of it can help, sometimes when it hurts there's no getting away from it. Holding 30# grand child can be difficult with LUE, have to switch sides a lot. Steroids helped. When my arm hurts, my whole body gets off including my BP  See above   PAIN:  Are you having pain? Yes: NPRS scale: 4/10 Pain location: mostly in neck, not as much in L shoulder today  Pain description: tightness, sharp throbbing pains  Aggravating factors: not sure  Relieving factors: unclear, sometimes laying down helps, sometimes nothing helps   PRECAUTIONS: Activity as tolerated, no spine specific precautions per MD note; still has G-tube   RED FLAGS: None     WEIGHT BEARING RESTRICTIONS: No  FALLS:  Has patient fallen in last 6 months? Yes. Number of falls 2- was sick at the time, sometimes feels off balance now but no falls when healthy   LIVING ENVIRONMENT: Lives with: lives with their  family Lives in: House/apartment   OCCUPATION: not working/on disability   PLOF: Independent, Independent with basic ADLs, Independent with gait, and Independent with transfers  PATIENT GOALS: address pain in neck and shoulders, avoid surgery   NEXT MD VISIT: Referring 08/25/23  OBJECTIVE:  Note: Objective measures were completed at Evaluation unless otherwise noted.  DIAGNOSTIC FINDINGS:  Imaging: XRs of the lumbar spine from 03/08/2023 were previously independently reviewed and interpreted, showing scoliosis with apex to the left at T12.  Chronically broken or cut rod seen at the lower thoracic spine on the right side.  Posterior instrumentation from L1-S1.  No lucency seen around the posterior fixation.  Interbody devices seen at L3/4, L4/5, and L5/S1.  No lucency seen around the interbody devices.  Devices appear in appropriate position.  No fracture or dislocation seen.  No evidence of instability on flexion/extension views.   XRs of the cervical spine from 07/14/2023 were independently reviewed and interpreted, showing disc height loss at C5/6 with anterior osteophyte formation. No other significant degenerative changes. Surgical staples along the left side of the neck anterior to the cervical spine.  No evidence of instability on flexion/extension views.  No fracture dislocation seen.  PATIENT SURVEYS:   THE PATIENT SPECIFIC FUNCTIONAL SCALE  Place score of 0-10 (0 = unable to perform activity and 10 = able to perform activity at the same level as before injury or problem)  Activity Date: 08/05/23 Eval     Laying on L side  5    2. Lifting >10# 5    3. Holding grandchild  5    4.      Total Score 5      Total Score = Sum of activity scores/number of activities  Minimally Detectable Change: 3 points (for single activity); 2 points (for average score)  [facility] (nd). The Patient Specific Functional Scale . Retrieved from  [email or web address]   COGNITION: Overall cognitive status: Within functional limits  for tasks assessed  SENSATION: Intermittent numbness in arms/hands sometimes, not at time of eval   POSTURE: rounded shoulders, forward head, decreased lumbar lordosis, and decreased thoracic kyphosis  PALPATION:  very tight and tender B upper traps (more notable trigger points and spasms noted L UT), R thoracic paraspinals very tender but L OK      CERVICAL ROM:   Active ROM A/PROM (deg) eval  Flexion 25% limited   Extension 40% limited   Right lateral flexion 50% limited more painful   Left lateral flexion 50% limited not as bad as right   Right rotation 25% limited   Left rotation 50% limited    (Blank rows = not tested)    Thoracic AROM: flexion 75% limited, extension extreme limitation/barely mobile; lateral flexion very limited, compensates from lumbar side bending; rotation 50% limited B   UPPER EXTREMITY ROM:  Active ROM Right eval Left eval  Shoulder flexion 90* 80*  Shoulder extension    Shoulder abduction 100* 100*  Shoulder adduction    Shoulder extension    Shoulder internal rotation T12  T12   Shoulder external rotation C7  Upper trap   Elbow flexion    Elbow extension    Wrist flexion    Wrist extension    Wrist ulnar deviation    Wrist radial deviation    Wrist pronation    Wrist supination     (Blank rows = not tested)  UPPER EXTREMITY MMT:  MMT Right eval Left eval  Shoulder flexion 4- 3-  Shoulder extension    Shoulder abduction 3+ 3-  Shoulder adduction    Shoulder extension    Shoulder internal rotation 4- 3+  Shoulder external rotation 4- 3+  Middle trapezius    Lower trapezius    Elbow flexion    Elbow extension    Wrist flexion    Wrist extension    Wrist ulnar deviation    Wrist radial deviation    Wrist pronation    Wrist supination    Grip strength     (Blank rows = not  tested)    TREATMENT DATE:  08/23/23 BP L arm  BP 148/96  start of session (auto cuff)       L arm    139/95             end of session Seated thoracic rotation 2x10 Seated thoracic extension 1x10 Shoulder shrugs 2x10 Shoulder retractions 2x10 Seated Upper Trapezius Stretch - 2 x 30 sec Supine with elevation of the head shoulder flexion with dowel 10x Supine cervical retractions Supine cervical rotations Manual - STM bilateral UT/PVM in sitting   08/17/2023: Self-Care:  Education provided on high blood pressure and causes for high blood pressure (specifically pain), radiating pain from neck into shoulder, and radiating pain into L leg Blood pressure assessment  0943: Rt arm, sitting, automatic cuff, 156/98 0946: Rt arm, sitting, automatic cuff, 147/109 0950: Rt arm, sitting, manual cuff, 148/100 0954: Lt arm, sitting, manual cuff, 180/110   08/05/23  Eval, POC, HEP practice and education as below  PATIENT EDUCATION:  Education details: exam findings, POC, HEP  Person educated: Patient Education method: Explanation, Demonstration, and Handouts Education comprehension: verbalized understanding, returned demonstration, and needs further education  HOME EXERCISE PROGRAM: Access Code: [email or web address] URL: [email or web address] Date: 08/05/2023 Prepared by: Josette Rough  Exercises - Seated Upper Trapezius Stretch  - 2 x daily - 7 x weekly - 1 sets - 6 reps - 30 seconds  hold - Gentle Levator Scapulae Stretch  - 2 x daily - 7 x weekly - 1 sets - 6 reps - Seated Scapular Retraction  - 2 x daily - 7 x weekly - 1 sets - 10 reps - 3 seconds  hold - Seated Backward Shoulder Rolls  - 2 x daily - 7 x weekly - 1 sets - 10 reps - Seated Cervical Retraction  - 2 x daily - 7 x weekly - 1 sets - 10 reps - 3 seconds  hold  ASSESSMENT:  CLINICAL  IMPRESSION: Pt needed full review of HEP as she has not  really started it yet  with cuing.   She demonstrated good understanding.     OBJECTIVE IMPAIRMENTS: decreased ROM, decreased strength, hypomobility, increased fascial restrictions, increased muscle spasms, impaired flexibility, impaired sensation, impaired UE functional use, improper body mechanics, postural dysfunction, and pain.   ACTIVITY LIMITATIONS: carrying, lifting, dressing, self feeding, reach over head, hygiene/grooming, and caring for others  PARTICIPATION LIMITATIONS: meal prep, cleaning, laundry, driving, shopping, community activity, and yard work  PERSONAL FACTORS: Age, Behavior pattern, Education, Fitness, Past/current experiences, Social background, and Time since onset of injury/illness/exacerbation are also affecting patient's functional outcome.   REHAB POTENTIAL: Fair chronicity and severity of pain  CLINICAL DECISION MAKING: Stable/uncomplicated  EVALUATION COMPLEXITY: Low   GOALS: Goals reviewed with patient? No  SHORT TERM GOALS: Target date: 09/02/2023    Will be compliant with appropriate progressive HEP  Baseline:  Goal status: INITIAL  2.  Will demonstrate improved posture with all functional tasks  Baseline:  Goal status: INITIAL  3.  Intensity and frequency of pain in L UE to have improved by 25%  Baseline:  Goal status: INITIAL  4. Cervical and thoracic ROM to be no more than 25% limited all planes of motion Baseline:  Goal status: INITIAL  5. Shoulder flexion and ABD AROM to have improved by 30*, FIR and FER AROM to have improved by 2 spinal levels  Baseline:  Goal status: INITIAL     LONG TERM GOALS: Target date: 09/30/2023    MMT to have improved by one grade in all weak groups  Baseline:  Goal status: INITIAL  2.  Intensity and frequency of pain in L UE to have improved by at least 50% Baseline:  Goal status: INITIAL  3.  Will be able to pick up at least 10# without  increased pain or difficulty  Baseline:  Goal status: INITIAL  4.  Will be able to hold grand child in L UE for at least 10 min without difficulty  Baseline:  Goal status: INITIAL  5.  Pain in neck and B shoulders to be no more than 5/10 at worst  Baseline:  Goal status: INITIAL  6.  PSFS to have improved by at least 2 points  Baseline:  Goal status: INITIAL   PLAN:  PT FREQUENCY: 2x/week  PT DURATION: 8 weeks  PLANNED INTERVENTIONS: 97750- Physical Performance Testing, 97110-Therapeutic exercises, 97530- Therapeutic activity, [id number]- Neuromuscular re-education, 97535- Self Care, 02859- Manual therapy, and 97012- Traction (mechanical)  PLAN FOR  NEXT SESSION: Continue with gentle seated and supine exercises for C spine, shoulder and posture.  Referring diagnosis? M54.12 Treatment diagnosis? (if different than referring diagnosis) M54.2, M62.81, R29.3, M25.511, M25.512, R29.898 What was this (referring dx) caused by? []  Surgery []  Fall [x]  Ongoing issue []  Arthritis []  Other: ____________  Laterality: []  Rt []  Lt [x]  Both  Check all possible CPT codes:  *CHOOSE 10 OR LESS*    See Planned Interventions listed in the Plan section of the Evaluation.

## 2023-08-23 ENCOUNTER — Ambulatory Visit (INDEPENDENT_AMBULATORY_CARE_PROVIDER_SITE_OTHER)

## 2023-08-23 DIAGNOSIS — M542 Cervicalgia: Secondary | ICD-10-CM

## 2023-08-23 DIAGNOSIS — M25511 Pain in right shoulder: Secondary | ICD-10-CM | POA: Diagnosis not present

## 2023-08-23 DIAGNOSIS — M6281 Muscle weakness (generalized): Secondary | ICD-10-CM

## 2023-08-23 DIAGNOSIS — R293 Abnormal posture: Secondary | ICD-10-CM | POA: Diagnosis not present

## 2023-08-23 DIAGNOSIS — M25512 Pain in left shoulder: Secondary | ICD-10-CM

## 2023-08-25 ENCOUNTER — Ambulatory Visit: Admitting: Orthopedic Surgery

## 2023-08-25 DIAGNOSIS — M5412 Radiculopathy, cervical region: Secondary | ICD-10-CM | POA: Diagnosis not present

## 2023-08-25 NOTE — Progress Notes (Signed)
 Orthopedic Spine Surgery Office Note   Assessment: Patient is a 58 y.o. female who is not having any significant lumbar spine issues but has noticed neck pain that radiates into the left upper extremity.  She feels the going into the shoulder and along the lateral aspect of the arm and dorsal forearm, possible radiculopathy     Plan: -Patient has tried naproxen , tylenol , activity modification, medrol  dose pak, diclofenac  -Patient has not noticed any improvement in her radiating arm pain over the last 6 weeks and spite of Tylenol , Medrol  Dosepak, PT, so recommended an MRI of the cervical spine to evaluate for radiculopathy -Patient will next be seen 4 weeks, x-rays at next visit: none     Patient expressed understanding of the plan and all questions were answered to the patient's satisfaction.    ___________________________________________________________________________     History: Patient is a 58 y.o. female who presents today for follow-up on her lumbar spine and cervical spine.  Patient states that her low back has been doing well.  She is not have any significant pain in her low back or pain radiating to either leg.  Her bigger issue has been neck pain that radiates into the left shoulder.  She also notes the pain radiating down further the arm.  She will feel like going into the lateral arm and dorsal forearm.  The worst part of the pain though is focused around the trapezius and lateral aspect of the shoulder.  She has no pain radiating into the right upper extremity.   Treatments tried: naproxen , tylenol , activity modification, medrol  dose pak, diclofenac      Physical Exam:   General: no acute distress, appears stated age Neurologic: alert, answering questions appropriately, following commands Respiratory: unlabored breathing on room air, symmetric chest rise Psychiatric: appropriate affect, normal cadence to speech     MSK (spine):   -Strength exam                                                    Left                  Right Grip strength                5/5                  5/5 Interosseus                  5/5                  5/5 Wrist extension            5/5                  5/5 Wrist flexion                 5/5                  5/5 Elbow flexion                5/5                  5/5 Deltoid                          5/5  5/5   EHL                              5/5                  5/5 TA                                 5/5                  5/5 GSC                             5/5                  5/5 Knee extension            5/5                  5/5 Hip flexion                    5/5                  5/5   -Sensory exam                           Sensation intact to light touch in L3-S1 nerve distributions of bilateral lower extremities             Sensation intact to light touch in C5-T1 nerve distributions of bilateral upper extremities    Imaging: XRs of the lumbar spine from 03/08/2023 were previously independently reviewed and interpreted, showing scoliosis with apex to the left at T12.  Chronically broken or cut rod seen at the lower thoracic spine on the right side.  Posterior instrumentation from L1-S1.  No lucency seen around the posterior fixation.  Interbody devices seen at L3/4, L4/5, and L5/S1.  No lucency seen around the interbody devices.  Devices appear in appropriate position.  No fracture or dislocation seen.  No evidence of instability on flexion/extension views.   XRs of the cervical spine from 07/14/2023 were previously independently reviewed and interpreted, showing disc height loss at C5/6 with anterior osteophyte formation. No other significant degenerative changes. Surgical staples along the left side of the neck anterior to the cervical spine.  No evidence of instability on flexion/extension views.  No fracture dislocation seen.     Patient name: Kristen [name] Patient MRN: [id number] Date of visit: 08/25/23

## 2023-08-26 NOTE — Therapy (Incomplete)
 OUTPATIENT PHYSICAL THERAPY CERVICAL TREATMENT  Patient Name: Kristen [name] MRN: [id number] DOB:1965/02/21, 58 y.o., female Today's Date: 08/26/2023  END OF SESSION:      Past Medical History:  Diagnosis Date   Allergy    Anemia    Iron deficiency   Anxiety    Arthritis    back, left shoulder   Blood transfusion without reported diagnosis    Chicken pox    Depression    Diarrhea    takes Imodium  daily   GERD (gastroesophageal reflux disease)    H/O hiatal hernia    Headache(784.0)    History of kidney stones    1996ish   History of radiation therapy 11/16/16- 01/01/17   Base of Tongue/ 66 gy in 33 fractions/ Dose: 2 Gy   Hypertension    Iron deficiency anemia 12/11/2018   Migraine    none for 5 years (as of 01/13/16)   OSA (obstructive sleep apnea) 10/13/2015   unable to get cpap, plans to get one in 2018   Pneumonia    Restless legs    Scoliosis    Shingles 08/28/2013   Shortness of breath    with exertion   Sleep apnea    Tongue cancer (HCC)    tongue cancer   Past Surgical History:  Procedure Laterality Date   BACK SURGERY  07/21/1978   CARPOMETACARPEL SUSPENSION PLASTY Left 01/21/2021   Procedure: Left Thumb Ligament Carpometacarpal Arthroplasty;  Surgeon: Romona Harari, MD;  Location: [facility] OR;  Service: Orthopedics;  Laterality: Left;   CHOLECYSTECTOMY N/A 08/15/2013   Procedure: LAPAROSCOPIC CHOLECYSTECTOMY WITH INTRAOPERATIVE CHOLANGIOGRAM;  Surgeon: Lynwood MALVA Pina, MD;  Location: [facility] OR;  Service: General;  Laterality: N/A;   COLONOSCOPY N/A 01/09/2013   Procedure: COLONOSCOPY;  Surgeon: Belvie JONETTA Just, MD;  Location: [facility] ENDOSCOPY;  Service: Endoscopy;  Laterality: N/A;   COLONOSCOPY  03/2018   DIRECT LARYNGOSCOPY  07/2016   Dr. Lauralee [facility]   ESOPHAGOGASTRODUODENOSCOPY  03/2018   ESOPHAGOGASTRODUODENOSCOPY (EGD) WITH PROPOFOL  N/A 04/24/2019   Procedure: ESOPHAGOGASTRODUODENOSCOPY (EGD) WITH PROPOFOL ;  Surgeon: Avram Lupita BRAVO, MD;  Location: [facility]  ENDOSCOPY;  Service: Endoscopy;  Laterality: N/A;   GASTROSTOMY TUBE PLACEMENT  09/27/2016   GIVENS CAPSULE STUDY N/A 04/24/2019   Procedure: GIVENS CAPSULE STUDY;  Surgeon: Avram Lupita BRAVO, MD;  Location: [facility] ENDOSCOPY;  Service: Endoscopy;  Laterality: N/A;   HERNIA REPAIR Left 1981   IR PATIENT EVAL TECH 0-60 MINS  12/13/2016   IR PATIENT EVAL TECH 0-60 MINS  04/11/2017   IR PATIENT EVAL TECH 0-60 MINS  03/24/2018   IR PATIENT EVAL TECH 0-60 MINS  11/23/2018   IR PATIENT EVAL TECH 0-60 MINS  05/28/2019   IR PATIENT EVAL TECH 0-60 MINS  12/17/2022   IR REPLACE G-TUBE SIMPLE WO FLUORO  12/12/2017   IR REPLACE G-TUBE SIMPLE WO FLUORO  03/29/2018   IR REPLACE G-TUBE SIMPLE WO FLUORO  06/29/2018   IR REPLACE G-TUBE SIMPLE WO FLUORO  11/02/2018   IR REPLACE G-TUBE SIMPLE WO FLUORO  03/08/2019   IR REPLACE G-TUBE SIMPLE WO FLUORO  07/02/2019   IR REPLACE G-TUBE SIMPLE WO FLUORO  11/30/2019   IR REPLACE G-TUBE SIMPLE WO FLUORO  12/27/2019   IR REPLACE G-TUBE SIMPLE WO FLUORO  05/27/2020   IR REPLACE G-TUBE SIMPLE WO FLUORO  07/09/2020   IR REPLACE G-TUBE SIMPLE WO FLUORO  10/23/2021   IR REPLACE G-TUBE SIMPLE WO FLUORO  08/19/2022   IR REPLACE G-TUBE SIMPLE WO FLUORO  07/13/2023   IR REPLC GASTRO/COLONIC TUBE PERCUT W/FLUORO  03/01/2022   MODIFIED RADICAL NECK DISSECTION Left 09/27/2016   Levels 1 & 2   PARTIAL GLOSSECTOMY Left 09/27/2016   Left hemi partial glossectomy   SPINE SURGERY  01/20/2016   fusion   TONSILLECTOMY     tracheotomy  09/27/2016   TUBAL LIGATION  06/1988   Patient Active Problem List   Diagnosis Date Noted   Urinary tract infection 08/17/2023   Hypokalemia 08/17/2023   S/P percutaneous endoscopic gastrostomy (PEG) tube placement (HCC) 08/17/2023   Acute encephalopathy 04/22/2023   Enteritis 01/12/2023   Generalized weakness 07/18/2022   Dehydration 07/18/2022   Tension headache 07/18/2022   Depression 07/18/2022   History of tongue cancer 07/18/2022   PEG tube malfunction  (HCC) 02/28/2022   Protein-calorie malnutrition, severe 04/29/2021   Pseudoarthrosis of lumbar spine 04/28/2021   Fusion of spine of lumbar region 04/28/2021   Pseudarthrosis after fusion or arthrodesis 04/20/2021    Class: Chronic   Tenosynovitis, de Quervain 12/14/2020   Arthritis of carpometacarpal (CMC) joint of left thumb 12/14/2020   Aortic atherosclerosis (HCC) 08/20/2020   Hyperkalemia 07/24/2020   Alcohol use 08/05/2019   Bilateral lower extremity edema 08/05/2019   Peripheral edema 03/12/2019   Iron deficiency anemia 12/11/2018   Loosening of hardware in spine [facility])    Other spondylosis with radiculopathy, lumbar region    History of lumbar spinal fusion 11/06/2018   UTI (urinary tract infection) 05/15/2017   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   Malignant neoplasm of base of tongue (HCC) 10/29/2016   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   Headache 05/14/2016   Tongue lesion 05/14/2016   Thrombocytopenia (HCC) 01/22/2016   Chest pain    Essential hypertension    Spinal stenosis of lumbar region 01/20/2016    Class: Chronic   DDD (degenerative disc disease), lumbar 01/20/2016    Class: Chronic   Spinal stenosis of lumbar region with neurogenic claudication 01/20/2016   Low back pain 12/22/2015   Hypersomnolence 10/13/2015   Muscle cramp 08/01/2015   GERD (gastroesophageal reflux disease) 07/09/2015   Anemia of chronic disease 01/08/2013    PCP: Sigrid Deidra Fox MD   REFERRING PROVIDER: Georgina Ozell LABOR, MD  REFERRING DIAG: Diagnosis M54.12 (ICD-10-CM) - Radiculopathy, cervical region  THERAPY DIAG:  No diagnosis found.  Rationale for Evaluation and Treatment: Rehabilitation  ONSET DATE: about 2-3 months ago   SUBJECTIVE:  SUBJECTIVE STATEMENT: ***  Pt states she went to the ED on Wednesday after being referred by our department.  She was in the hospital Wednesday and discharged on Saturday.      Feeling much better. Still on medication for the UTI and for the headaches.       Seeing PCP today.  Hand dominance: Right  PERTINENT HISTORY:  Usually my pain starts when I first get up in the morning, especially L shoulder but sometimes it can go down to both hands. Sometimes it eases off, sometimes it doesn't. Sometimes laying down and getting mind off of it can help, sometimes when it hurts there's no getting away from it. Holding 30# grand child can be difficult with LUE, have to switch sides a lot. Steroids helped. When my arm hurts, my whole body gets off including my BP  See above   PAIN:  Are you having pain? Yes: NPRS scale: 4/10 Pain location: mostly in neck, not as much in L shoulder today  Pain description: tightness, sharp throbbing pains  Aggravating factors: not sure  Relieving factors: unclear, sometimes laying down helps, sometimes nothing helps   PRECAUTIONS: Activity as tolerated, no spine specific precautions per MD note; still has G-tube   RED FLAGS: None     WEIGHT BEARING RESTRICTIONS: No  FALLS:  Has patient fallen in last 6 months? Yes. Number of falls 2- was sick at the time, sometimes feels off balance now but no falls when healthy   LIVING ENVIRONMENT: Lives with: lives with their family Lives in: House/apartment   OCCUPATION: not working/on disability   PLOF: Independent, Independent with basic ADLs, Independent with gait, and Independent with transfers  PATIENT GOALS: address pain in neck and shoulders, avoid surgery   NEXT MD VISIT: Referring 08/25/23  OBJECTIVE:  Note: Objective measures were completed at Evaluation unless otherwise noted.  DIAGNOSTIC FINDINGS:  Imaging: XRs of the lumbar spine from 03/08/2023 were previously independently reviewed and  interpreted, showing scoliosis with apex to the left at T12.  Chronically broken or cut rod seen at the lower thoracic spine on the right side.  Posterior instrumentation from L1-S1.  No lucency seen around the posterior fixation.  Interbody devices seen at L3/4, L4/5, and L5/S1.  No lucency seen around the interbody devices.  Devices appear in appropriate position.  No fracture or dislocation seen.  No evidence of instability on flexion/extension views.   XRs of the cervical spine from 07/14/2023 were independently reviewed and interpreted, showing disc height loss at C5/6 with anterior osteophyte formation. No other significant degenerative changes. Surgical staples along the left side of the neck anterior to the cervical spine.  No evidence of instability on flexion/extension views.  No fracture dislocation seen.  PATIENT SURVEYS:   THE PATIENT SPECIFIC FUNCTIONAL SCALE  Place score of 0-10 (0 = unable to perform activity and 10 = able to perform activity at the same level as before injury or problem)  Activity Date: 08/05/23 Eval     Laying on L side  5    2. Lifting >10# 5    3. Holding grandchild  5    4.      Total Score 5      Total Score = Sum of activity scores/number of activities  Minimally Detectable Change: 3 points (for single activity); 2 points (for average score)  [employer] (nd). The Patient Specific Functional Scale . Retrieved from [email or web address]   COGNITION: Overall cognitive status: Within functional  limits for tasks assessed  SENSATION: Intermittent numbness in arms/hands sometimes, not at time of eval   POSTURE: rounded shoulders, forward head, decreased lumbar lordosis, and decreased thoracic kyphosis  PALPATION:  very tight and tender B upper traps (more notable trigger points and spasms noted L UT), R thoracic paraspinals very tender but L OK      CERVICAL ROM:   Active ROM A/PROM  (deg) eval  Flexion 25% limited   Extension 40% limited   Right lateral flexion 50% limited more painful   Left lateral flexion 50% limited not as bad as right   Right rotation 25% limited   Left rotation 50% limited    (Blank rows = not tested)    Thoracic AROM: flexion 75% limited, extension extreme limitation/barely mobile; lateral flexion very limited, compensates from lumbar side bending; rotation 50% limited B   UPPER EXTREMITY ROM:  Active ROM Right eval Left eval  Shoulder flexion 90* 80*  Shoulder extension    Shoulder abduction 100* 100*  Shoulder adduction    Shoulder extension    Shoulder internal rotation T12  T12   Shoulder external rotation C7  Upper trap   Elbow flexion    Elbow extension    Wrist flexion    Wrist extension    Wrist ulnar deviation    Wrist radial deviation    Wrist pronation    Wrist supination     (Blank rows = not tested)  UPPER EXTREMITY MMT:  MMT Right eval Left eval  Shoulder flexion 4- 3-  Shoulder extension    Shoulder abduction 3+ 3-  Shoulder adduction    Shoulder extension    Shoulder internal rotation 4- 3+  Shoulder external rotation 4- 3+  Middle trapezius    Lower trapezius    Elbow flexion    Elbow extension    Wrist flexion    Wrist extension    Wrist ulnar deviation    Wrist radial deviation    Wrist pronation    Wrist supination    Grip strength     (Blank rows = not tested)    TREATMENT DATE:  08/29/2023 ***  08/23/23 BP L arm  BP 148/96  start of session (auto cuff)       L arm    139/95             end of session Seated thoracic rotation 2x10 Seated thoracic extension 1x10 Shoulder shrugs 2x10 Shoulder retractions 2x10 Seated Upper Trapezius Stretch - 2 x 30 sec Supine with elevation of the head shoulder flexion with dowel 10x Supine cervical retractions Supine cervical rotations Manual - STM bilateral UT/PVM in sitting   08/17/2023: Self-Care:  Education provided on high blood  pressure and causes for high blood pressure (specifically pain), radiating pain from neck into shoulder, and radiating pain into L leg Blood pressure assessment  0943: Rt arm, sitting, automatic cuff, 156/98 0946: Rt arm, sitting, automatic cuff, 147/109 0950: Rt arm, sitting, manual cuff, 148/100 0954: Lt arm, sitting, manual cuff, 180/110   08/05/23  Eval, POC, HEP practice and education as below  PATIENT EDUCATION:  Education details: exam findings, POC, HEP  Person educated: Patient Education method: Explanation, Demonstration, and Handouts Education comprehension: verbalized understanding, returned demonstration, and needs further education  HOME EXERCISE PROGRAM: Access Code: [email or web address] URL: [email or web address] Date: 08/05/2023 Prepared by: Josette Rough  Exercises - Seated Upper Trapezius Stretch  - 2 x daily - 7 x weekly - 1 sets - 6 reps - 30 seconds  hold - Gentle Levator Scapulae Stretch  - 2 x daily - 7 x weekly - 1 sets - 6 reps - Seated Scapular Retraction  - 2 x daily - 7 x weekly - 1 sets - 10 reps - 3 seconds  hold - Seated Backward Shoulder Rolls  - 2 x daily - 7 x weekly - 1 sets - 10 reps - Seated Cervical Retraction  - 2 x daily - 7 x weekly - 1 sets - 10 reps - 3 seconds  hold  ASSESSMENT:  CLINICAL IMPRESSION: ***  Pt needed full review of HEP as she has not  really started it yet  with cuing.   She demonstrated good understanding.     OBJECTIVE IMPAIRMENTS: decreased ROM, decreased strength, hypomobility, increased fascial restrictions, increased muscle spasms, impaired flexibility, impaired sensation, impaired UE functional use, improper body mechanics, postural dysfunction, and pain.   ACTIVITY LIMITATIONS: carrying, lifting, dressing, self feeding, reach over head, hygiene/grooming, and caring for  others  PARTICIPATION LIMITATIONS: meal prep, cleaning, laundry, driving, shopping, community activity, and yard work  PERSONAL FACTORS: Age, Behavior pattern, Education, Fitness, Past/current experiences, Social background, and Time since onset of injury/illness/exacerbation are also affecting patient's functional outcome.   REHAB POTENTIAL: Fair chronicity and severity of pain  CLINICAL DECISION MAKING: Stable/uncomplicated  EVALUATION COMPLEXITY: Low   GOALS: Goals reviewed with patient? No  SHORT TERM GOALS: Target date: 09/02/2023    Will be compliant with appropriate progressive HEP  Baseline:  Goal status: INITIAL  2.  Will demonstrate improved posture with all functional tasks  Baseline:  Goal status: INITIAL  3.  Intensity and frequency of pain in L UE to have improved by 25%  Baseline:  Goal status: INITIAL  4. Cervical and thoracic ROM to be no more than 25% limited all planes of motion Baseline:  Goal status: INITIAL  5. Shoulder flexion and ABD AROM to have improved by 30*, FIR and FER AROM to have improved by 2 spinal levels  Baseline:  Goal status: INITIAL     LONG TERM GOALS: Target date: 09/30/2023    MMT to have improved by one grade in all weak groups  Baseline:  Goal status: INITIAL  2.  Intensity and frequency of pain in L UE to have improved by at least 50% Baseline:  Goal status: INITIAL  3.  Will be able to pick up at least 10# without increased pain or difficulty  Baseline:  Goal status: INITIAL  4.  Will be able to hold grand child in L UE for at least 10 min without difficulty  Baseline:  Goal status: INITIAL  5.  Pain in neck and B shoulders to be no more than 5/10 at worst  Baseline:  Goal status: INITIAL  6.  PSFS to have improved by at least 2 points  Baseline:  Goal status: INITIAL   PLAN:  PT FREQUENCY: 2x/week  PT DURATION: 8 weeks  PLANNED INTERVENTIONS: 97750- Physical Performance Testing, 97110-Therapeutic  exercises, 97530- Therapeutic activity, [id number]- Neuromuscular re-education, 97535- Self Care, [id number]- Manual therapy, and 97012- Traction (mechanical)  PLAN FOR NEXT SESSION: *** Continue with gentle seated and supine exercises for C spine, shoulder and posture.  Referring diagnosis? M54.12 Treatment diagnosis? (if different than referring diagnosis) M54.2, M62.81, R29.3, M25.511, M25.512, R29.898 What was this (referring dx) caused by? []  Surgery []  Fall [x]  Ongoing issue []  Arthritis []  Other: ____________  Laterality: []  Rt []  Lt [x]  Both  Check all possible CPT codes:  *CHOOSE 10 OR LESS*    See Planned Interventions listed in the Plan section of the Evaluation.

## 2023-08-29 ENCOUNTER — Encounter

## 2023-08-31 ENCOUNTER — Encounter

## 2023-09-02 NOTE — Therapy (Signed)
 OUTPATIENT PHYSICAL THERAPY CERVICAL TREATMENT  Patient Name: Kristen [name] MRN: [id number] DOB:[birth year], 58 y.o., female Today's Date: 09/05/2023  END OF SESSION:  PT End of Session - 09/05/23 0912     Visit Number 4    Number of Visits 17    Date for PT Re-Evaluation 09/30/23    Authorization Type [employer]    Authorization Time Period 08/05/23 to 09/30/23    PT Start Time 0933    PT Stop Time 1013    PT Time Calculation (min) 40 min    Activity Tolerance Patient tolerated treatment well    Behavior During Therapy [facility] for tasks assessed/performed            Past Medical History:  Diagnosis Date   Allergy    Anemia    Iron deficiency   Anxiety    Arthritis    back, left shoulder   Blood transfusion without reported diagnosis    Chicken pox    Depression    Diarrhea    takes Imodium  daily   GERD (gastroesophageal reflux disease)    H/O hiatal hernia    Headache(784.0)    History of kidney stones    1996ish   History of radiation therapy 11/16/16- 01/01/17   Base of Tongue/ 66 gy in 33 fractions/ Dose: 2 Gy   Hypertension    Iron deficiency anemia 12/11/2018   Migraine    none for 5 years (as of 01/13/16)   OSA (obstructive sleep apnea) 10/13/2015   unable to get cpap, plans to get one in 2018   Pneumonia    Restless legs    Scoliosis    Shingles 08/28/2013   Shortness of breath    with exertion   Sleep apnea    Tongue cancer (HCC)    tongue cancer   Past Surgical History:  Procedure Laterality Date   BACK SURGERY  07/21/1978   CARPOMETACARPEL SUSPENSION PLASTY Left 01/21/2021   Procedure: Left Thumb Ligament Carpometacarpal Arthroplasty;  Surgeon: Romona Harari, MD;  Location: [facility] OR;  Service: Orthopedics;  Laterality: Left;   CHOLECYSTECTOMY N/A 08/15/2013   Procedure: LAPAROSCOPIC CHOLECYSTECTOMY WITH INTRAOPERATIVE CHOLANGIOGRAM;  Surgeon: Lynwood MALVA Pina, MD;  Location: [facility] OR;  Service: General;  Laterality: N/A;   COLONOSCOPY N/A 01/09/2013    Procedure: COLONOSCOPY;  Surgeon: Belvie JONETTA Just, MD;  Location: [facility] ENDOSCOPY;  Service: Endoscopy;  Laterality: N/A;   COLONOSCOPY  03/2018   DIRECT LARYNGOSCOPY  07/2016   Dr. Lauralee [facility]   ESOPHAGOGASTRODUODENOSCOPY  03/2018   ESOPHAGOGASTRODUODENOSCOPY (EGD) WITH PROPOFOL  N/A 04/24/2019   Procedure: ESOPHAGOGASTRODUODENOSCOPY (EGD) WITH PROPOFOL ;  Surgeon: Avram Lupita BRAVO, MD;  Location: [facility] ENDOSCOPY;  Service: Endoscopy;  Laterality: N/A;   GASTROSTOMY TUBE PLACEMENT  09/27/2016   GIVENS CAPSULE STUDY N/A 04/24/2019   Procedure: GIVENS CAPSULE STUDY;  Surgeon: Avram Lupita BRAVO, MD;  Location: [facility] ENDOSCOPY;  Service: Endoscopy;  Laterality: N/A;   HERNIA REPAIR Left 1981   IR PATIENT EVAL TECH 0-60 MINS  12/13/2016   IR PATIENT EVAL TECH 0-60 MINS  04/11/2017   IR PATIENT EVAL TECH 0-60 MINS  03/24/2018   IR PATIENT EVAL TECH 0-60 MINS  11/23/2018   IR PATIENT EVAL TECH 0-60 MINS  05/28/2019   IR PATIENT EVAL TECH 0-60 MINS  12/17/2022   IR REPLACE G-TUBE SIMPLE WO FLUORO  12/12/2017   IR REPLACE G-TUBE SIMPLE WO FLUORO  03/29/2018   IR REPLACE G-TUBE SIMPLE WO FLUORO  06/29/2018   IR REPLACE G-TUBE  SIMPLE WO FLUORO  11/02/2018   IR REPLACE G-TUBE SIMPLE WO FLUORO  03/08/2019   IR REPLACE G-TUBE SIMPLE WO FLUORO  07/02/2019   IR REPLACE G-TUBE SIMPLE WO FLUORO  11/30/2019   IR REPLACE G-TUBE SIMPLE WO FLUORO  12/27/2019   IR REPLACE G-TUBE SIMPLE WO FLUORO  05/27/2020   IR REPLACE G-TUBE SIMPLE WO FLUORO  07/09/2020   IR REPLACE G-TUBE SIMPLE WO FLUORO  10/23/2021   IR REPLACE G-TUBE SIMPLE WO FLUORO  08/19/2022   IR REPLACE G-TUBE SIMPLE WO FLUORO  07/13/2023   IR REPLC GASTRO/COLONIC TUBE PERCUT W/FLUORO  03/01/2022   MODIFIED RADICAL NECK DISSECTION Left 09/27/2016   Levels 1 & 2   PARTIAL GLOSSECTOMY Left 09/27/2016   Left hemi partial glossectomy   SPINE SURGERY  01/20/2016   fusion   TONSILLECTOMY     tracheotomy  09/27/2016   TUBAL LIGATION  06/1988   Patient Active Problem List    Diagnosis Date Noted   Urinary tract infection 08/17/2023   Hypokalemia 08/17/2023   S/P percutaneous endoscopic gastrostomy (PEG) tube placement (HCC) 08/17/2023   Acute encephalopathy 04/22/2023   Enteritis 01/12/2023   Generalized weakness 07/18/2022   Dehydration 07/18/2022   Tension headache 07/18/2022   Depression 07/18/2022   History of tongue cancer 07/18/2022   PEG tube malfunction (HCC) 02/28/2022   Protein-calorie malnutrition, severe 04/29/2021   Pseudoarthrosis of lumbar spine 04/28/2021   Fusion of spine of lumbar region 04/28/2021   Pseudarthrosis after fusion or arthrodesis 04/20/2021    Class: Chronic   Tenosynovitis, de Quervain 12/14/2020   Arthritis of carpometacarpal (CMC) joint of left thumb 12/14/2020   Aortic atherosclerosis (HCC) 08/20/2020   Hyperkalemia 07/24/2020   Alcohol use 08/05/2019   Bilateral lower extremity edema 08/05/2019   Peripheral edema 03/12/2019   Iron deficiency anemia 12/11/2018   Loosening of hardware in spine (HCC)    Other spondylosis with radiculopathy, lumbar region    History of lumbar spinal fusion 11/06/2018   UTI (urinary tract infection) 05/15/2017   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   Malignant neoplasm of base of tongue (HCC) 10/29/2016   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   Headache 05/14/2016   Tongue lesion 05/14/2016   Thrombocytopenia (HCC) 01/22/2016   Chest pain    Essential hypertension    Spinal stenosis of lumbar region 01/20/2016    Class: Chronic   DDD (degenerative disc disease), lumbar 01/20/2016    Class: Chronic   Spinal stenosis of lumbar region with neurogenic claudication 01/20/2016   Low back pain 12/22/2015   Hypersomnolence 10/13/2015   Muscle cramp 08/01/2015   GERD (gastroesophageal reflux disease) 07/09/2015   Anemia of chronic disease 01/08/2013    PCP: Sigrid Deidra Fox MD   REFERRING PROVIDER: Georgina Ozell LABOR, MD  REFERRING DIAG:  Diagnosis M54.12 (ICD-10-CM) - Radiculopathy, cervical region  THERAPY DIAG:  Cervicalgia  Muscle weakness (generalized)  Abnormal posture  Bilateral shoulder pain, unspecified chronicity  Rationale for Evaluation and Treatment: Rehabilitation  ONSET DATE: about 2-3 months ago   SUBJECTIVE:  SUBJECTIVE STATEMENT: Patient stating that she is having a MRI for shoulder and cervical spine on September 15, 2023. Pain rated at 3/10 this morning.   Hand dominance: Right  PERTINENT HISTORY:  Usually my pain starts when I first get up in the morning, especially L shoulder but sometimes it can go down to both hands. Sometimes it eases off, sometimes it doesn't. Sometimes laying down and getting mind off of it can help, sometimes when it hurts there's no getting away from it. Holding 30# grand child can be difficult with LUE, have to switch sides a lot. Steroids helped. When my arm hurts, my whole body gets off including my BP  See above   PAIN:  Are you having pain? Yes: NPRS scale: 4/10 Pain location: mostly in neck, not as much in L shoulder today  Pain description: tightness, sharp throbbing pains  Aggravating factors: not sure  Relieving factors: unclear, sometimes laying down helps, sometimes nothing helps   PRECAUTIONS: Activity as tolerated, no spine specific precautions per MD note; still has G-tube   RED FLAGS: None     WEIGHT BEARING RESTRICTIONS: No  FALLS:  Has patient fallen in last 6 months? Yes. Number of falls 2- was sick at the time, sometimes feels off balance now but no falls when healthy   LIVING ENVIRONMENT: Lives with: lives with their family Lives in: House/apartment   OCCUPATION: not working/on disability   PLOF: Independent, Independent with basic ADLs,  Independent with gait, and Independent with transfers  PATIENT GOALS: address pain in neck and shoulders, avoid surgery   NEXT MD VISIT: Referring 08/25/23  OBJECTIVE:  Note: Objective measures were completed at Evaluation unless otherwise noted.  DIAGNOSTIC FINDINGS:  Imaging: XRs of the lumbar spine from 03/08/2023 were previously independently reviewed and interpreted, showing scoliosis with apex to the left at T12.  Chronically broken or cut rod seen at the lower thoracic spine on the right side.  Posterior instrumentation from L1-S1.  No lucency seen around the posterior fixation.  Interbody devices seen at L3/4, L4/5, and L5/S1.  No lucency seen around the interbody devices.  Devices appear in appropriate position.  No fracture or dislocation seen.  No evidence of instability on flexion/extension views.   XRs of the cervical spine from 07/14/2023 were independently reviewed and interpreted, showing disc height loss at C5/6 with anterior osteophyte formation. No other significant degenerative changes. Surgical staples along the left side of the neck anterior to the cervical spine.  No evidence of instability on flexion/extension views.  No fracture dislocation seen.  PATIENT SURVEYS:   THE PATIENT SPECIFIC FUNCTIONAL SCALE  Place score of 0-10 (0 = unable to perform activity and 10 = able to perform activity at the same level as before injury or problem)  Activity Date: 08/05/23 Eval     Laying on L side  5    2. Lifting >10# 5    3. Holding grandchild  5    4.      Total Score 5      Total Score = Sum of activity scores/number of activities  Minimally Detectable Change: 3 points (for single activity); 2 points (for average score)  [facility] (nd). The Patient Specific Functional Scale . Retrieved from [email or web address]   COGNITION: Overall cognitive status: Within functional limits for tasks  assessed  SENSATION: Intermittent numbness in arms/hands sometimes, not at time of eval   POSTURE: rounded shoulders, forward head, decreased lumbar lordosis, and decreased thoracic kyphosis  PALPATION:  very tight and tender B upper traps (more notable trigger points and spasms noted L UT), R thoracic paraspinals very tender but L OK      CERVICAL ROM:   Active ROM A/PROM (deg) eval  Flexion 25% limited   Extension 40% limited   Right lateral flexion 50% limited more painful   Left lateral flexion 50% limited not as bad as right   Right rotation 25% limited   Left rotation 50% limited    (Blank rows = not tested)    Thoracic AROM: flexion 75% limited, extension extreme limitation/barely mobile; lateral flexion very limited, compensates from lumbar side bending; rotation 50% limited B   UPPER EXTREMITY ROM:  Active ROM Right eval Left eval  Shoulder flexion 90* 80*  Shoulder extension    Shoulder abduction 100* 100*  Shoulder adduction    Shoulder extension    Shoulder internal rotation T12  T12   Shoulder external rotation C7  Upper trap   Elbow flexion    Elbow extension    Wrist flexion    Wrist extension    Wrist ulnar deviation    Wrist radial deviation    Wrist pronation    Wrist supination     (Blank rows = not tested)  UPPER EXTREMITY MMT:  MMT Right eval Left eval  Shoulder flexion 4- 3-  Shoulder extension    Shoulder abduction 3+ 3-  Shoulder adduction    Shoulder extension    Shoulder internal rotation 4- 3+  Shoulder external rotation 4- 3+  Middle trapezius    Lower trapezius    Elbow flexion    Elbow extension    Wrist flexion    Wrist extension    Wrist ulnar deviation    Wrist radial deviation    Wrist pronation    Wrist supination    Grip strength     (Blank rows = not tested)    TREATMENT DATE:  09/05/2023 Self-Care  Discussed back/LE pain that occurs after prolonged sitting, return for MRI on shoulder/cervical  spine Vitals taken at beginning of session with automatic cuff   Vitals:   09/05/23 0936  BP: (!) 132/94    TherEx:  Seated Shoulder retractions 1x15  Seated Shoulder shrugs 1x15  Seated UT stretch 2x30s each side  Seated flexion with towel assist 1x10 with 3-5s hold  Seated rotation with towel assist 1x10 with 3s hold each direction Supine shoulder flexion with 2# bar 2x8   08/23/23 BP L arm  BP 148/96  start of session (auto cuff)       L arm    139/95             end of session Seated thoracic rotation 2x10 Seated thoracic extension 1x10 Shoulder shrugs 2x10 Shoulder retractions 2x10 Seated Upper Trapezius Stretch - 2 x 30 sec Supine with elevation of the head shoulder flexion with dowel 10x Supine cervical retractions Supine cervical rotations Manual - STM bilateral UT/PVM in sitting   08/17/2023: Self-Care:  Education provided on high blood pressure and causes for high blood pressure (specifically pain), radiating pain from neck into shoulder, and radiating pain into L leg Blood pressure assessment  0943: Rt arm, sitting, automatic cuff, 156/98 0946: Rt arm, sitting, automatic cuff, 147/109 0950: Rt arm, sitting, manual cuff, 148/100 0954: Lt arm, sitting, manual cuff, 180/110   08/05/23  Eval, POC, HEP practice and education as below  PATIENT EDUCATION:  Education details: exam findings, POC, HEP  Person educated: Patient Education method: Explanation, Demonstration, and Handouts Education comprehension: verbalized understanding, returned demonstration, and needs further education  HOME EXERCISE PROGRAM: Access Code: [email or web address] URL: [email or web address] Date: 08/05/2023 Prepared by: Josette Rough  Exercises - Seated Upper Trapezius Stretch  - 2 x daily - 7 x weekly - 1 sets - 6 reps - 30 seconds  hold - Gentle  Levator Scapulae Stretch  - 2 x daily - 7 x weekly - 1 sets - 6 reps - Seated Scapular Retraction  - 2 x daily - 7 x weekly - 1 sets - 10 reps - 3 seconds  hold - Seated Backward Shoulder Rolls  - 2 x daily - 7 x weekly - 1 sets - 10 reps - Seated Cervical Retraction  - 2 x daily - 7 x weekly - 1 sets - 10 reps - 3 seconds  hold  ASSESSMENT:  CLINICAL IMPRESSION: Patient arrived to session noting no change in symptoms, but starting to have Rt leg pain similar to what she had in the past in her Lt leg, especially with prolonged sitting. Patient tolerated all mobility activities as well as adding weight to supine shoulder flexion. Bugs were noted on patient's clothing and in work area where patient was; pest control was called. Patient will continue to benefit from skilled PT.    OBJECTIVE IMPAIRMENTS: decreased ROM, decreased strength, hypomobility, increased fascial restrictions, increased muscle spasms, impaired flexibility, impaired sensation, impaired UE functional use, improper body mechanics, postural dysfunction, and pain.   ACTIVITY LIMITATIONS: carrying, lifting, dressing, self feeding, reach over head, hygiene/grooming, and caring for others  PARTICIPATION LIMITATIONS: meal prep, cleaning, laundry, driving, shopping, community activity, and yard work  PERSONAL FACTORS: Age, Behavior pattern, Education, Fitness, Past/current experiences, Social background, and Time since onset of injury/illness/exacerbation are also affecting patient's functional outcome.   REHAB POTENTIAL: Fair chronicity and severity of pain  CLINICAL DECISION MAKING: Stable/uncomplicated  EVALUATION COMPLEXITY: Low   GOALS: Goals reviewed with patient? No  SHORT TERM GOALS: Target date: 09/02/2023    Will be compliant with appropriate progressive HEP  Baseline:  Goal status: INITIAL  2.  Will demonstrate improved posture with all functional tasks  Baseline:  Goal status: INITIAL  3.  Intensity and  frequency of pain in L UE to have improved by 25%  Baseline:  Goal status: INITIAL  4. Cervical and thoracic ROM to be no more than 25% limited all planes of motion Baseline:  Goal status: INITIAL  5. Shoulder flexion and ABD AROM to have improved by 30*, FIR and FER AROM to have improved by 2 spinal levels  Baseline:  Goal status: INITIAL     LONG TERM GOALS: Target date: 09/30/2023    MMT to have improved by one grade in all weak groups  Baseline:  Goal status: INITIAL  2.  Intensity and frequency of pain in L UE to have improved by at least 50% Baseline:  Goal status: INITIAL  3.  Will be able to pick up at least 10# without increased pain or difficulty  Baseline:  Goal status: INITIAL  4.  Will be able to hold grand child in L UE for at least 10 min without difficulty  Baseline:  Goal status: INITIAL  5.  Pain in neck and B shoulders to be no more than 5/10 at worst  Baseline:  Goal status: INITIAL  6.  PSFS to have improved by at least 2 points  Baseline:  Goal status: INITIAL   PLAN:  PT FREQUENCY: 2x/week  PT DURATION: 8 weeks  PLANNED INTERVENTIONS: 97750- Physical Performance Testing, 97110-Therapeutic exercises, 97530- Therapeutic activity, [id number]- Neuromuscular re-education, 97535- Self Care, [id number]- Manual therapy, and 97012- Traction (mechanical)  PLAN FOR NEXT SESSION:  Continue with gentle seated and supine exercises for C spine, shoulder and posture.  Susannah Daring, PT, DPT 09/05/23 10:34 AM     Referring diagnosis? M54.12 Treatment diagnosis? (if different than referring diagnosis) M54.2, M62.81, R29.3, M25.511, M25.512, R29.898 What was this (referring dx) caused by? []  Surgery []  Fall [x]  Ongoing issue []  Arthritis []  Other: ____________  Laterality: []  Rt []  Lt [x]  Both  Check all possible CPT codes:  *CHOOSE 10 OR LESS*    See Planned Interventions listed in the Plan section of the Evaluation.

## 2023-09-05 ENCOUNTER — Telehealth: Payer: Self-pay | Admitting: Physical Therapy

## 2023-09-05 ENCOUNTER — Ambulatory Visit (INDEPENDENT_AMBULATORY_CARE_PROVIDER_SITE_OTHER)

## 2023-09-05 VITALS — BP 132/94

## 2023-09-05 DIAGNOSIS — M542 Cervicalgia: Secondary | ICD-10-CM | POA: Diagnosis not present

## 2023-09-05 DIAGNOSIS — M25512 Pain in left shoulder: Secondary | ICD-10-CM

## 2023-09-05 DIAGNOSIS — M25511 Pain in right shoulder: Secondary | ICD-10-CM | POA: Diagnosis not present

## 2023-09-05 DIAGNOSIS — M6281 Muscle weakness (generalized): Secondary | ICD-10-CM

## 2023-09-05 DIAGNOSIS — R293 Abnormal posture: Secondary | ICD-10-CM

## 2023-09-05 NOTE — Telephone Encounter (Signed)
 Called pt to notify her that bug found on pt during treatment session was a bed bug.  Pt unaware she had bed bugs, so recommended treatment at home.  Advised pt to try to leave all belongings she could in car at next appt.  Pt verbalized understanding.    Corean JULIANNA Ku, PT, DPT 09/05/23 1:53 PM

## 2023-09-07 ENCOUNTER — Ambulatory Visit (INDEPENDENT_AMBULATORY_CARE_PROVIDER_SITE_OTHER)

## 2023-09-07 VITALS — BP 141/93 | HR 76

## 2023-09-07 DIAGNOSIS — M542 Cervicalgia: Secondary | ICD-10-CM

## 2023-09-07 DIAGNOSIS — M25511 Pain in right shoulder: Secondary | ICD-10-CM | POA: Diagnosis not present

## 2023-09-07 DIAGNOSIS — M6281 Muscle weakness (generalized): Secondary | ICD-10-CM | POA: Diagnosis not present

## 2023-09-07 DIAGNOSIS — R293 Abnormal posture: Secondary | ICD-10-CM | POA: Diagnosis not present

## 2023-09-07 DIAGNOSIS — R29898 Other symptoms and signs involving the musculoskeletal system: Secondary | ICD-10-CM

## 2023-09-07 DIAGNOSIS — M25512 Pain in left shoulder: Secondary | ICD-10-CM

## 2023-09-07 NOTE — Therapy (Addendum)
 " OUTPATIENT PHYSICAL THERAPY CERVICAL TREATMENT / DISCHARGE  Patient Name: Kristen [name] MRN: [id number] DOB:Apr 04, 1965, 58 y.o., female Today's Date: 09/07/2023  END OF SESSION:  PT End of Session - 09/07/23 0907     Visit Number 5    Number of Visits 17    Date for PT Re-Evaluation 09/30/23    Authorization Type [employer]    Authorization Time Period 08/05/23 to 09/30/23    PT Start Time 0934    PT Stop Time 1012    PT Time Calculation (min) 38 min    Activity Tolerance Patient tolerated treatment well    Behavior During Therapy [facility] for tasks assessed/performed             Past Medical History:  Diagnosis Date   Allergy    Anemia    Iron deficiency   Anxiety    Arthritis    back, left shoulder   Blood transfusion without reported diagnosis    Chicken pox    Depression    Diarrhea    takes Imodium  daily   GERD (gastroesophageal reflux disease)    H/O hiatal hernia    Headache(784.0)    History of kidney stones    1996ish   History of radiation therapy 11/16/16- 01/01/17   Base of Tongue/ 66 gy in 33 fractions/ Dose: 2 Gy   Hypertension    Iron deficiency anemia 12/11/2018   Migraine    none for 5 years (as of 01/13/16)   OSA (obstructive sleep apnea) 10/13/2015   unable to get cpap, plans to get one in 2018   Pneumonia    Restless legs    Scoliosis    Shingles 08/28/2013   Shortness of breath    with exertion   Sleep apnea    Tongue cancer (HCC)    tongue cancer   Past Surgical History:  Procedure Laterality Date   BACK SURGERY  07/21/1978   CARPOMETACARPEL SUSPENSION PLASTY Left 01/21/2021   Procedure: Left Thumb Ligament Carpometacarpal Arthroplasty;  Surgeon: Romona Harari, MD;  Location: [facility] OR;  Service: Orthopedics;  Laterality: Left;   CHOLECYSTECTOMY N/A 08/15/2013   Procedure: LAPAROSCOPIC CHOLECYSTECTOMY WITH INTRAOPERATIVE CHOLANGIOGRAM;  Surgeon: Lynwood MALVA Pina, MD;  Location: [facility] OR;  Service: General;  Laterality: N/A;   COLONOSCOPY N/A  01/09/2013   Procedure: COLONOSCOPY;  Surgeon: Belvie JONETTA Just, MD;  Location: [facility] ENDOSCOPY;  Service: Endoscopy;  Laterality: N/A;   COLONOSCOPY  03/2018   DIRECT LARYNGOSCOPY  07/2016   Dr. Lauralee [facility]   ESOPHAGOGASTRODUODENOSCOPY  03/2018   ESOPHAGOGASTRODUODENOSCOPY (EGD) WITH PROPOFOL  N/A 04/24/2019   Procedure: ESOPHAGOGASTRODUODENOSCOPY (EGD) WITH PROPOFOL ;  Surgeon: Avram Lupita BRAVO, MD;  Location: [facility] ENDOSCOPY;  Service: Endoscopy;  Laterality: N/A;   GASTROSTOMY TUBE PLACEMENT  09/27/2016   GIVENS CAPSULE STUDY N/A 04/24/2019   Procedure: GIVENS CAPSULE STUDY;  Surgeon: Avram Lupita BRAVO, MD;  Location: [facility] ENDOSCOPY;  Service: Endoscopy;  Laterality: N/A;   HERNIA REPAIR Left 1981   IR PATIENT EVAL TECH 0-60 MINS  12/13/2016   IR PATIENT EVAL TECH 0-60 MINS  04/11/2017   IR PATIENT EVAL TECH 0-60 MINS  03/24/2018   IR PATIENT EVAL TECH 0-60 MINS  11/23/2018   IR PATIENT EVAL TECH 0-60 MINS  05/28/2019   IR PATIENT EVAL TECH 0-60 MINS  12/17/2022   IR REPLACE G-TUBE SIMPLE WO FLUORO  12/12/2017   IR REPLACE G-TUBE SIMPLE WO FLUORO  03/29/2018   IR REPLACE G-TUBE SIMPLE WO FLUORO  06/29/2018  IR REPLACE G-TUBE SIMPLE WO FLUORO  11/02/2018   IR REPLACE G-TUBE SIMPLE WO FLUORO  03/08/2019   IR REPLACE G-TUBE SIMPLE WO FLUORO  07/02/2019   IR REPLACE G-TUBE SIMPLE WO FLUORO  11/30/2019   IR REPLACE G-TUBE SIMPLE WO FLUORO  12/27/2019   IR REPLACE G-TUBE SIMPLE WO FLUORO  05/27/2020   IR REPLACE G-TUBE SIMPLE WO FLUORO  07/09/2020   IR REPLACE G-TUBE SIMPLE WO FLUORO  10/23/2021   IR REPLACE G-TUBE SIMPLE WO FLUORO  08/19/2022   IR REPLACE G-TUBE SIMPLE WO FLUORO  07/13/2023   IR REPLC GASTRO/COLONIC TUBE PERCUT W/FLUORO  03/01/2022   MODIFIED RADICAL NECK DISSECTION Left 09/27/2016   Levels 1 & 2   PARTIAL GLOSSECTOMY Left 09/27/2016   Left hemi partial glossectomy   SPINE SURGERY  01/20/2016   fusion   TONSILLECTOMY     tracheotomy  09/27/2016   TUBAL LIGATION  06/1988   Patient Active  Problem List   Diagnosis Date Noted   Urinary tract infection 08/17/2023   Hypokalemia 08/17/2023   S/P percutaneous endoscopic gastrostomy (PEG) tube placement (HCC) 08/17/2023   Acute encephalopathy 04/22/2023   Enteritis 01/12/2023   Generalized weakness 07/18/2022   Dehydration 07/18/2022   Tension headache 07/18/2022   Depression 07/18/2022   History of tongue cancer 07/18/2022   PEG tube malfunction (HCC) 02/28/2022   Protein-calorie malnutrition, severe 04/29/2021   Pseudoarthrosis of lumbar spine 04/28/2021   Fusion of spine of lumbar region 04/28/2021   Pseudarthrosis after fusion or arthrodesis 04/20/2021    Class: Chronic   Tenosynovitis, de Quervain 12/14/2020   Arthritis of carpometacarpal (CMC) joint of left thumb 12/14/2020   Aortic atherosclerosis (HCC) 08/20/2020   Hyperkalemia 07/24/2020   Alcohol use 08/05/2019   Bilateral lower extremity edema 08/05/2019   Peripheral edema 03/12/2019   Iron deficiency anemia 12/11/2018   Loosening of hardware in spine (HCC)    Other spondylosis with radiculopathy, lumbar region    History of lumbar spinal fusion 11/06/2018   UTI (urinary tract infection) 05/15/2017   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   Malignant neoplasm of base of tongue (HCC) 10/29/2016   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   Headache 05/14/2016   Tongue lesion 05/14/2016   Thrombocytopenia (HCC) 01/22/2016   Chest pain    Essential hypertension    Spinal stenosis of lumbar region 01/20/2016    Class: Chronic   DDD (degenerative disc disease), lumbar 01/20/2016    Class: Chronic   Spinal stenosis of lumbar region with neurogenic claudication 01/20/2016   Low back pain 12/22/2015   Hypersomnolence 10/13/2015   Muscle cramp 08/01/2015   GERD (gastroesophageal reflux disease) 07/09/2015   Anemia of chronic disease 01/08/2013    PCP: Sigrid Deidra Fox MD   REFERRING PROVIDER: Georgina Ozell LABOR,  MD  REFERRING DIAG: Diagnosis M54.12 (ICD-10-CM) - Radiculopathy, cervical region  THERAPY DIAG:  Cervicalgia  Muscle weakness (generalized)  Abnormal posture  Bilateral shoulder pain, unspecified chronicity  Other symptoms and signs involving the musculoskeletal system  Rationale for Evaluation and Treatment: Rehabilitation  ONSET DATE: about 2-3 months ago   SUBJECTIVE:  SUBJECTIVE STATEMENT: Patient has complaints of Rt LE pain and Lt shoulder. Patient stating pain level at 3-6/10 this morning depending on body part.    Hand dominance: Right  PERTINENT HISTORY:  Usually my pain starts when I first get up in the morning, especially L shoulder but sometimes it can go down to both hands. Sometimes it eases off, sometimes it doesn't. Sometimes laying down and getting mind off of it can help, sometimes when it hurts there's no getting away from it. Holding 30# grand child can be difficult with LUE, have to switch sides a lot. Steroids helped. When my arm hurts, my whole body gets off including my BP  See above   PAIN:  Are you having pain? Yes: NPRS scale: 4/10 Pain location: mostly in neck, not as much in L shoulder today  Pain description: tightness, sharp throbbing pains  Aggravating factors: not sure  Relieving factors: unclear, sometimes laying down helps, sometimes nothing helps   PRECAUTIONS: Activity as tolerated, no spine specific precautions per MD note; still has G-tube   RED FLAGS: None     WEIGHT BEARING RESTRICTIONS: No  FALLS:  Has patient fallen in last 6 months? Yes. Number of falls 2- was sick at the time, sometimes feels off balance now but no falls when healthy   LIVING ENVIRONMENT: Lives with: lives with their family Lives in:  House/apartment   OCCUPATION: not working/on disability   PLOF: Independent, Independent with basic ADLs, Independent with gait, and Independent with transfers  PATIENT GOALS: address pain in neck and shoulders, avoid surgery   NEXT MD VISIT: Referring 08/25/23  OBJECTIVE:  Note: Objective measures were completed at Evaluation unless otherwise noted.  DIAGNOSTIC FINDINGS:  Imaging: XRs of the lumbar spine from 03/08/2023 were previously independently reviewed and interpreted, showing scoliosis with apex to the left at T12.  Chronically broken or cut rod seen at the lower thoracic spine on the right side.  Posterior instrumentation from L1-S1.  No lucency seen around the posterior fixation.  Interbody devices seen at L3/4, L4/5, and L5/S1.  No lucency seen around the interbody devices.  Devices appear in appropriate position.  No fracture or dislocation seen.  No evidence of instability on flexion/extension views.   XRs of the cervical spine from 07/14/2023 were independently reviewed and interpreted, showing disc height loss at C5/6 with anterior osteophyte formation. No other significant degenerative changes. Surgical staples along the left side of the neck anterior to the cervical spine.  No evidence of instability on flexion/extension views.  No fracture dislocation seen.  PATIENT SURVEYS:   THE PATIENT SPECIFIC FUNCTIONAL SCALE  Place score of 0-10 (0 = unable to perform activity and 10 = able to perform activity at the same level as before injury or problem)  Activity Date: 08/05/23 Eval     Laying on L side  5    2. Lifting >10# 5    3. Holding grandchild  5    4.      Total Score 5      Total Score = Sum of activity scores/number of activities  Minimally Detectable Change: 3 points (for single activity); 2 points (for average score)  [facility] (nd). The Patient Specific Functional Scale . Retrieved from  [email or web address]   COGNITION: Overall cognitive status: Within functional limits for tasks assessed  SENSATION: Intermittent numbness in arms/hands sometimes, not at time of eval   POSTURE: rounded shoulders, forward head, decreased lumbar lordosis, and decreased thoracic kyphosis  PALPATION:  very tight and tender B upper traps (more notable trigger points and spasms noted L UT), R thoracic paraspinals very tender but L OK      CERVICAL ROM:   Active ROM A/PROM (deg) eval  Flexion 25% limited   Extension 40% limited   Right lateral flexion 50% limited more painful   Left lateral flexion 50% limited not as bad as right   Right rotation 25% limited   Left rotation 50% limited    (Blank rows = not tested)    Thoracic AROM: flexion 75% limited, extension extreme limitation/barely mobile; lateral flexion very limited, compensates from lumbar side bending; rotation 50% limited B   UPPER EXTREMITY ROM:  Active ROM Right eval Left eval  Shoulder flexion 90* 80*  Shoulder extension    Shoulder abduction 100* 100*  Shoulder adduction    Shoulder extension    Shoulder internal rotation T12  T12   Shoulder external rotation C7  Upper trap   Elbow flexion    Elbow extension    Wrist flexion    Wrist extension    Wrist ulnar deviation    Wrist radial deviation    Wrist pronation    Wrist supination     (Blank rows = not tested)  UPPER EXTREMITY MMT:  MMT Right eval Left eval  Shoulder flexion 4- 3-  Shoulder extension    Shoulder abduction 3+ 3-  Shoulder adduction    Shoulder extension    Shoulder internal rotation 4- 3+  Shoulder external rotation 4- 3+  Middle trapezius    Lower trapezius    Elbow flexion    Elbow extension    Wrist flexion    Wrist extension    Wrist ulnar deviation    Wrist radial deviation    Wrist pronation    Wrist supination    Grip strength     (Blank rows = not  tested)    TREATMENT DATE:  09/07/2023 Self-Care:  PT discussing plan for follow-up and POC   Vitals:   09/07/23 0938  BP: (!) 141/93  Pulse: 76   TherEx:  Seated flexion with towel assist 1x15 with 3s hold  Seated rotation with towel assist 1x15 each direction with 3s hold  Seated UT stretch 3x30s each side  Seated rows with red TB 2x10 with 3-4s hold  Seated external rotation with scap retraction 2x10 with 3s hold    09/05/2023 Self-Care  Discussed back/LE pain that occurs after prolonged sitting, return for MRI on shoulder/cervical spine Vitals taken at beginning of session with automatic cuff   Vitals:   09/07/23 0938  BP: (!) 141/93  Pulse: 76     TherEx:  Seated Shoulder retractions 1x15  Seated Shoulder shrugs 1x15  Seated UT stretch 2x30s each side  Seated flexion with towel assist 1x10 with 3-5s hold  Seated rotation with towel assist 1x10 with 3s hold each direction Supine shoulder flexion with 2# bar 2x8   08/23/23 BP L arm  BP 148/96  start of session (auto cuff)       L arm    139/95             end of session Seated thoracic rotation 2x10 Seated thoracic extension 1x10 Shoulder shrugs 2x10 Shoulder retractions 2x10 Seated Upper Trapezius Stretch - 2 x 30 sec Supine with elevation of the head shoulder flexion with dowel 10x Supine cervical retractions Supine cervical rotations Manual - STM bilateral UT/PVM in sitting   08/17/2023: Self-Care:  Education provided on high blood pressure and causes for high blood pressure (specifically pain), radiating pain from neck into shoulder, and radiating pain into L leg Blood pressure assessment  0943: Rt arm, sitting, automatic cuff, 156/98 0946: Rt arm, sitting, automatic cuff, 147/109 0950: Rt arm, sitting, manual cuff, 148/100 0954: Lt arm, sitting, manual cuff, 180/110   08/05/23  Eval, POC, HEP practice and education as below                                                                                                                                  PATIENT EDUCATION:  Education details: exam findings, POC, HEP  Person educated: Patient Education method: Explanation, Demonstration, and Handouts Education comprehension: verbalized understanding, returned demonstration, and needs further education  HOME EXERCISE PROGRAM: Access Code: [email or web address] URL: [email or web address] Date: 08/05/2023 Prepared by: Josette Rough  Exercises - Seated Upper Trapezius Stretch  - 2 x daily - 7 x weekly - 1 sets - 6 reps - 30 seconds  hold - Gentle Levator Scapulae Stretch  - 2 x daily - 7 x weekly - 1 sets - 6 reps - Seated Scapular Retraction  - 2 x daily - 7 x weekly - 1 sets - 10 reps - 3 seconds  hold - Seated Backward Shoulder Rolls  - 2 x daily - 7 x weekly - 1 sets - 10 reps - Seated Cervical Retraction  - 2 x daily - 7 x weekly - 1 sets - 10 reps - 3 seconds  hold  ASSESSMENT:  CLINICAL IMPRESSION: Patient arrived to session with complaints of increased pain in Rt LE, Rt thoracic/lumbar spine, and Lt shoulder. PT assessed BP with results noted above. PT discussed follow-up options with patient with PT and patient agreeing to not make today a DC and to reassess after MRI and MD appointments. Patient reporting improved symptoms at beginning of session, though endorsing increased Rt leg pain upon standing. PT provided patient with two yellow TBs to add resistance to seated scapular retractions and to perform seated external rotation. Patient will continue to benefit from skilled PT.      OBJECTIVE IMPAIRMENTS: decreased ROM, decreased strength, hypomobility, increased fascial restrictions, increased muscle spasms, impaired flexibility, impaired sensation, impaired UE functional use, improper body mechanics, postural dysfunction, and pain.   ACTIVITY LIMITATIONS: carrying, lifting, dressing, self feeding, reach over head, hygiene/grooming, and caring for others  PARTICIPATION  LIMITATIONS: meal prep, cleaning, laundry, driving, shopping, community activity, and yard work  PERSONAL FACTORS: Age, Behavior pattern, Education, Fitness, Past/current experiences, Social background, and Time since onset of injury/illness/exacerbation are also affecting patient's functional outcome.   REHAB POTENTIAL: Fair chronicity and severity of pain  CLINICAL DECISION MAKING: Stable/uncomplicated  EVALUATION COMPLEXITY: Low   GOALS: Goals reviewed with patient? No  SHORT TERM GOALS: Target date: 09/02/2023    Will be compliant with appropriate progressive HEP  Baseline:  Goal status: INITIAL  2.  Will  demonstrate improved posture with all functional tasks  Baseline:  Goal status: INITIAL  3.  Intensity and frequency of pain in L UE to have improved by 25%  Baseline:  Goal status: INITIAL  4. Cervical and thoracic ROM to be no more than 25% limited all planes of motion Baseline:  Goal status: INITIAL  5. Shoulder flexion and ABD AROM to have improved by 30*, FIR and FER AROM to have improved by 2 spinal levels  Baseline:  Goal status: INITIAL     LONG TERM GOALS: Target date: 09/30/2023    MMT to have improved by one grade in all weak groups  Baseline:  Goal status: INITIAL  2.  Intensity and frequency of pain in L UE to have improved by at least 50% Baseline:  Goal status: INITIAL  3.  Will be able to pick up at least 10# without increased pain or difficulty  Baseline:  Goal status: INITIAL  4.  Will be able to hold grand child in L UE for at least 10 min without difficulty  Baseline:  Goal status: INITIAL  5.  Pain in neck and B shoulders to be no more than 5/10 at worst  Baseline:  Goal status: INITIAL  6.  PSFS to have improved by at least 2 points  Baseline:  Goal status: INITIAL   PLAN:  PT FREQUENCY: 2x/week  PT DURATION: 8 weeks  PLANNED INTERVENTIONS: 97750- Physical Performance Testing, 97110-Therapeutic exercises, 97530-  Therapeutic activity, [id number]- Neuromuscular re-education, 97535- Self Care, 02859- Manual therapy, and 97012- Traction (mechanical)  PLAN FOR NEXT SESSION:  Continue with gentle seated and supine exercises for C spine, shoulder and posture.  PHYSICAL THERAPY DISCHARGE SUMMARY  Visits from Start of Care: 5  Current functional level related to goals / functional outcomes: See above    Remaining deficits: See above    Education / Equipment: HEP   Patient agrees to discharge. Patient goals were not met. Patient is being discharged due to not returning since the last visit.  Susannah Daring, PT, DPT 09/07/23 10:41 AM     Referring diagnosis? M54.12 Treatment diagnosis? (if different than referring diagnosis) M54.2, M62.81, R29.3, M25.511, M25.512, R29.898 What was this (referring dx) caused by? []  Surgery []  Fall [x]  Ongoing issue []  Arthritis []  Other: ____________  Laterality: []  Rt []  Lt [x]  Both  Check all possible CPT codes:  *CHOOSE 10 OR LESS*    See Planned Interventions listed in the Plan section of the Evaluation.     "

## 2023-09-08 ENCOUNTER — Encounter: Payer: Self-pay | Admitting: Internal Medicine

## 2023-09-09 ENCOUNTER — Encounter: Payer: Self-pay | Admitting: Orthopedic Surgery

## 2023-09-12 ENCOUNTER — Encounter: Payer: Self-pay | Admitting: Orthopedic Surgery

## 2023-09-13 ENCOUNTER — Encounter: Payer: Self-pay | Admitting: Orthopedic Surgery

## 2023-09-15 ENCOUNTER — Ambulatory Visit
Admission: RE | Admit: 2023-09-15 | Discharge: 2023-09-15 | Disposition: A | Discharge status: Home / Self Care | Source: Ambulatory Visit | Attending: Orthopedic Surgery | Admitting: Orthopedic Surgery

## 2023-09-15 DIAGNOSIS — M5412 Radiculopathy, cervical region: Secondary | ICD-10-CM

## 2023-09-22 ENCOUNTER — Ambulatory Visit (INDEPENDENT_AMBULATORY_CARE_PROVIDER_SITE_OTHER): Admitting: Orthopedic Surgery

## 2023-09-22 DIAGNOSIS — M5412 Radiculopathy, cervical region: Secondary | ICD-10-CM | POA: Diagnosis not present

## 2023-09-22 MED ORDER — METHYLPREDNISOLONE 4 MG PO TBPK
ORAL_TABLET | ORAL | 0 refills | Status: DC
Start: 2023-09-22 — End: 2023-11-16

## 2023-09-22 MED ORDER — BACLOFEN 10 MG PO TABS: 10.0000 mg | ORAL_TABLET | Freq: Two times a day (BID) | ORAL | 2 refills | Status: AC

## 2023-09-22 NOTE — Progress Notes (Signed)
 Orthopedic Spine Surgery Office Note   Assessment: Patient is a 58 y.o. female who is reporting recent onset of pain along the lateral aspect of the thighs and legs.  No significant back pain.  Radiating pain into the left shoulder, lateral arm, and dorsal forearm has improved since last visit     Plan: -Patient has tried naproxen , tylenol , activity modification, medrol  dose pak, diclofenac  -Prescribed her a Medrol  Dosepak for the acute onset of lateral thigh and leg pain -Refilled her baclofen  which she has been taking chronically as well -Patient should return to office on an as needed basis     Patient expressed understanding of the plan and all questions were answered to the patient's satisfaction.    ___________________________________________________________________________     History: Patient is a 58 y.o. female who presents today for follow-up on her lumbar spine and cervical spine.  Patient states that her low back has been doing well.  Within the last week, she has noticed pain along the lateral aspect of her legs.  She has not been using anything besides Tylenol  to help with that pain.  There was no trauma or injury up to the onset of pain.  No bowel or bladder incontinence.  No saddle anesthesia  She says that the neck pain radiating to the left shoulder has gotten better since she was last in the office.  It has not resolved but is currently tolerable.  She is not interested in doing any further treatment at this time since it has gotten better.   Treatments tried: naproxen , tylenol , activity modification, medrol  dose pak, diclofenac      Physical Exam:   General: no acute distress, appears stated age Neurologic: alert, answering questions appropriately, following commands Respiratory: unlabored breathing on room air, symmetric chest rise Psychiatric: appropriate affect, normal cadence to speech     MSK (spine):   -Strength exam                                                    Left                  Right Grip strength                5/5                  5/5 Interosseus                  5/5                  5/5 Wrist extension            5/5                  5/5 Wrist flexion                 5/5                  5/5 Elbow flexion                5/5                  5/5 Deltoid                          5/5  5/5   EHL                              5/5                  5/5 TA                                 5/5                  5/5 GSC                             5/5                  5/5 Knee extension            5/5                  5/5 Hip flexion                    5/5                  5/5   -Sensory exam                           Sensation intact to light touch in L3-S1 nerve distributions of bilateral lower extremities             Sensation intact to light touch in C5-T1 nerve distributions of bilateral upper extremities   -Achilles DTR: 1/4 on the left, 1/4 on the right -Patellar tendon DTR: 1/4 on the left, 1/4 on the right -No beats of clonus bilaterally -Normal gait   Imaging: XRs of the lumbar spine from 03/08/2023 were previously independently reviewed and interpreted, showing scoliosis with apex to the left at T12.  Chronically broken or cut rod seen at the lower thoracic spine on the right side.  Posterior instrumentation from L1-S1.  No lucency seen around the posterior fixation.  Interbody devices seen at L3/4, L4/5, and L5/S1.  No lucency seen around the interbody devices.  Devices appear in appropriate position.  No fracture or dislocation seen.  No evidence of instability on flexion/extension views.   XRs of the cervical spine from 07/14/2023 were previously independently reviewed and interpreted, showing disc height loss at C5/6 with anterior osteophyte formation. No other significant degenerative changes. Surgical staples along the left side of the neck anterior to the cervical spine.  No evidence of instability on  flexion/extension views.  No fracture dislocation seen.  MRI of the cervical spine from 09/15/2023 was independently reviewed and interpreted, showing mild central stenosis at C5/6.  Left-sided foraminal stenosis at C5/6.  No other significant stenosis seen.  No T2 cord signal change seen.     Patient name: Kristen [name] Patient MRN: [id number] Date of visit: 09/22/23

## 2023-09-26 ENCOUNTER — Emergency Department (HOSPITAL_COMMUNITY)

## 2023-09-26 ENCOUNTER — Emergency Department (HOSPITAL_COMMUNITY)
Admission: EM | Admit: 2023-09-26 | Discharge: 2023-09-26 | Disposition: A | Discharge status: Home / Self Care | Attending: Emergency Medicine | Admitting: Emergency Medicine

## 2023-09-26 DIAGNOSIS — Z7982 Long term (current) use of aspirin: Secondary | ICD-10-CM | POA: Diagnosis not present

## 2023-09-26 DIAGNOSIS — K9423 Gastrostomy malfunction: Secondary | ICD-10-CM | POA: Insufficient documentation

## 2023-09-26 MED ORDER — IOHEXOL 300 MG/ML  SOLN
30.0000 mL | Freq: Once | INTRAMUSCULAR | Status: AC | PRN
  Administered 2023-09-26: 30 mL

## 2023-09-26 NOTE — ED Provider Notes (Signed)
 [place] EMERGENCY DEPARTMENT AT [facility] Provider Note   CSN: [id number] Arrival date & time: 09/26/23  1557     Patient presents with: No chief complaint on file.   Kristen [name] is a 58 y.o. female. Patient presents to the ED with concerns of leaking PEG tube. States she noticed leaking today. No other concerns at this time such as vomiting, diarrhea, abdominal pain, fevers, or body aches.   HPI     Prior to Admission medications   Medication Sig Start Date End Date Taking? Authorizing Provider  acetaminophen  (TYLENOL ) 500 MG tablet Take 2 tablets (1,000 mg total) by mouth every 8 (eight) hours as needed for moderate pain or mild pain. Patient taking differently: Place 1,000 mg into feeding tube every 8 (eight) hours as needed for moderate pain (pain score 4-6) or mild pain (pain score 1-3). 04/21/22   Georgina Ozell LABOR, MD  AMBULATORY NON FORMULARY MEDICATION Jevity 1.5 Sig: 6 cans per day, Bolus feeding  60ml syringes #30  Tape and gauze 03/12/22   Avram Lupita BRAVO, MD  amLODipine  (NORVASC ) 10 MG tablet Place 1 tablet (10 mg total) into feeding tube daily. 08/20/23 11/18/23  [place], Eric J, DO  aspirin  81 MG chewable tablet Place 81 mg into feeding tube daily.  10/05/16   [provider]  baclofen  (LIORESAL ) 10 MG tablet Take 1 tablet (10 mg total) by mouth 2 (two) times daily. 09/22/23 12/21/23  Georgina Ozell LABOR, MD  betamethasone  dipropionate 0.05 % cream Apply 1 Application topically 2 (two) times daily as needed (eczema). 04/06/23   [provider]  butalbital -acetaminophen -caffeine  (FIORICET ) 50-325-40 MG tablet Place 1 tablet into feeding tube every 6 (six) hours as needed for headache. 08/20/23 08/19/24  [place], Camellia PARAS, DO  Calcium  Citrate 200 MG TABS Place 200 mg into feeding tube daily at 12 noon.    [provider]  cholecalciferol  (VITAMIN D3) 10 MCG (400 UNIT) TABS tablet Place 1 tablet (400 Units total) into feeding tube daily.  03/31/23   Avram Lupita BRAVO, MD  cyanocobalamin  (VITAMIN B12) 500 MCG tablet Take 1 tablet (500 mcg total) by mouth daily. Patient taking differently: Place 500 mcg into feeding tube daily. 04/28/23   Samtani, Jai-Gurmukh, MD  dexlansoprazole  (DEXILANT ) 60 MG capsule ADMINISTER 1 CAPSULE BY MOUTH VIA FEEDING TUBE DAILY Patient taking differently: Place 60 mg into feeding tube daily. 03/31/23   Avram Lupita BRAVO, MD  diclofenac  (VOLTAREN ) 75 MG EC tablet TAKE 1 TABLET(75 MG) BY MOUTH TWICE DAILY WITH A MEAL Patient taking differently: Take 75 mg by mouth 2 (two) times daily as needed for mild pain (pain score 1-3) or moderate pain (pain score 4-6). 05/19/23   Georgina Ozell LABOR, MD  diphenoxylate -atropine  (LOMOTIL ) 2.5-0.025 MG tablet TAKE TWO TABLETS VIA TUBE TWICE DAILY Patient taking differently: Take 2 tablets by mouth in the morning and at bedtime. 05/30/23   Avram Lupita BRAVO, MD  ferrous sulfate  [phone] Fe) MG/5ML solution Take 6.8 ml via feeding tube daily 05/17/23   Avram Lupita BRAVO, MD  folic acid  (FOLVITE ) 400 MCG tablet Place 400 mcg into feeding tube daily.    [provider]  ketoconazole (NIZORAL) 2 % cream Apply 1 Application topically 2 (two) times daily as needed for irritation. 04/06/23   [provider]  lidocaine  (LMX) 4 % cream Apply 1 application  topically as needed (to affected sites- for pain).    [provider]  loratadine  (CLARITIN ) 10 MG tablet Place  10 mg into feeding tube daily.    [provider]  methylPREDNISolone  (MEDROL  DOSEPAK) 4 MG TBPK tablet Take as prescribed on the box 09/22/23   Georgina Ozell LABOR, MD  naproxen  (NAPROSYN ) 500 MG tablet Take 500 mg by mouth every 12 (twelve) hours as needed for mild pain (pain score 1-3) or moderate pain (pain score 4-6). 03/30/23   [provider]  ondansetron  (ZOFRAN -ODT) 4 MG disintegrating tablet Take 1 tablet (4 mg total) by mouth every 8 (eight) hours as needed for nausea or vomiting. 08/24/21    Geiple, Joshua, PA-C  polyethylene glycol (MIRALAX  / GLYCOLAX ) 17 g packet Place 17 g into feeding tube daily as needed for moderate constipation. 04/28/23   Samtani, Jai-Gurmukh, MD  potassium chloride  (KLOR-CON ) 20 MEQ packet Place 20 mEq into feeding tube daily. 08/20/23 11/18/23  [place], Camellia PARAS, DO  sertraline  (ZOLOFT ) 25 MG tablet TAKE 1 TABLET BY MOUTH EVERY DAY Patient taking differently: Place 25 mg into feeding tube daily. 04/26/22   Mercer Clotilda SAUNDERS, MD  TERBINAFINE EX Apply 1 drop topically at bedtime. Terbinafine 1000mg  Dmso susp. Apply 1 drop to all toenails nightly at bedtime. 04/06/23   [provider]    Allergies: Sulfa antibiotics and Tomato    Review of Systems  Gastrointestinal:        PEG tube concerns  All other systems reviewed and are negative.   Updated Vital Signs BP (!) 138/97 (BP Location: Left Arm)   Pulse 81   Temp 98.4 F (36.9 C) (Oral)   Resp 16   LMP 11/15/2013   SpO2 98%   Physical Exam Vitals and nursing note reviewed.  Constitutional:      General: She is not in acute distress.    Appearance: She is well-developed.  HENT:     Head: Normocephalic and atraumatic.  Eyes:     Conjunctiva/sclera: Conjunctivae normal.  Cardiovascular:     Rate and Rhythm: Normal rate and regular rhythm.     Heart sounds: No murmur heard. Pulmonary:     Effort: Pulmonary effort is normal. No respiratory distress.     Breath sounds: Normal breath sounds.  Abdominal:     Palpations: Abdomen is soft.     Tenderness: There is no abdominal tenderness.     Comments: Tear in lumen of PEG tube. Replaced with 18 Fr. Flow and return confirmed.  Musculoskeletal:        General: No swelling.     Cervical back: Neck supple.  Skin:    General: Skin is warm and dry.     Capillary Refill: Capillary refill takes less than 2 seconds.  Neurological:     Mental Status: She is alert.  Psychiatric:        Mood and Affect: Mood normal.     (all labs ordered are  listed, but only abnormal results are displayed) Labs Reviewed - No data to display  EKG: None  Radiology: DG ABDOMEN PEG TUBE LOCATION Result Date: 09/26/2023 CLINICAL DATA:  Peg tube replacement EXAM: DG ABDOMEN 1V COMPARISON:  11/02/2020, 04/22/2023 FINDINGS: Supine frontal view of the upper abdomen excludes the right flank by collimation. Percutaneous gastrostomy tube identified, with injected contrast outlining the gastric rugal folds. Bowel gas pattern is unremarkable. Postsurgical changes of the thoracolumbar spine again noted. IMPRESSION: 1. Percutaneous gastrostomy tube within the gastric lumen, with injected contrast outlining the gastric rugal folds. Electronically Signed   By: Ozell Daring M.D.   On: 09/26/2023 22:09  Procedures   Medications Ordered in the ED  iohexol  (OMNIPAQUE ) 300 MG/ML solution 30 mL (30 mLs Per Tube Contrast Given 09/26/23 2157)                                    Medical Decision Making Amount and/or Complexity of Data Reviewed Radiology: ordered.  Risk Prescription drug management.   This patient presents to the ED for concern of PEG tube concerns. Differential diagnosis includes PEG tube removal, PEG malfunction, cellulitis, abscess   Imaging Studies ordered:  I ordered imaging studies including xray abdomen PEG tube location  I independently visualized and interpreted imaging which showed percutaneous gastrostomy tube within the gastric lumen, with injected contrast outlining the gastric rugal folds. I agree with the radiologist interpretation  Problem List / ED Course:  Patient with PEG tube presents for concerns of malfunctioning PEG tube. States that her tube began to leak today and she requesting to have it replaced. No reported vomiting, diarrhea, chills, or bodyaches. Exam reveals tear in lumen of PEG tube. Firmly attached. Balloon filled with approximately 7cc of fluid. PEG tube replaced with 18 Fr. Confirmed return. Balloon  inflated with 8cc of NS flush. Xray abdomen to confirm position which shows tube within gastric lumen. Patient otherwise stable at this time for outpatient follow up and discharged home.  Final diagnoses:  PEG tube malfunction [facility])    ED Discharge Orders     None          Cecily Legrand LABOR, PA-C 09/26/23 2336    Freddi [name], MD 09/29/23 [phone]

## 2023-09-26 NOTE — Discharge Instructions (Signed)
 You were seen in the ER for PEG tube replacement. This was replaced and the position confirmed to be in the correct area with xray imaging. Follow up with your primary care provider for any other concerns.

## 2023-09-26 NOTE — ED Triage Notes (Signed)
 Patient states her feeding tube has a hole in it and needs to be replaced. No other issues or complaints, no pain. Hole observed in feeding tube. Has been in for 2 months.    18 french tube

## 2023-11-09 ENCOUNTER — Other Ambulatory Visit: Payer: Self-pay | Admitting: Internal Medicine

## 2023-11-10 NOTE — Telephone Encounter (Signed)
 Please advise Sir, she has an appointment with us  11/16/2023.

## 2023-11-16 ENCOUNTER — Encounter: Payer: Self-pay | Admitting: Internal Medicine

## 2023-11-16 ENCOUNTER — Ambulatory Visit: Admitting: Internal Medicine

## 2023-11-16 VITALS — BP 122/70 | HR 81 | Ht 67.0 in | Wt 197.0 lb

## 2023-11-16 DIAGNOSIS — K529 Noninfective gastroenteritis and colitis, unspecified: Secondary | ICD-10-CM | POA: Diagnosis not present

## 2023-11-16 DIAGNOSIS — R1319 Other dysphagia: Secondary | ICD-10-CM

## 2023-11-16 DIAGNOSIS — K219 Gastro-esophageal reflux disease without esophagitis: Secondary | ICD-10-CM | POA: Diagnosis not present

## 2023-11-16 DIAGNOSIS — B370 Candidal stomatitis: Secondary | ICD-10-CM

## 2023-11-16 MED ORDER — CLOTRIMAZOLE 10 MG MT TROC: 10.0000 mg | Freq: Every day | OROMUCOSAL | 1 refills | Status: AC

## 2023-11-16 MED ORDER — DIPHENOXYLATE-ATROPINE 2.5-0.025 MG PO TABS: 2.0000 | ORAL_TABLET | Freq: Four times a day (QID) | ORAL | 5 refills | Status: AC

## 2023-11-16 NOTE — Progress Notes (Unsigned)
 Kristen [name] 58 y.o. 06/01/1965 [id number]  Assessment & Plan:   Encounter Diagnoses  Name Primary?   Gastroesophageal reflux disease, unspecified whether esophagitis present Yes   Esophageal dysphagia    Chronic diarrhea    Oral thrush      Chronic diarrhea with variable response to Lomotil , up to 12 tablets daily. Symptoms worsened over 7 months, 4-5 bowel movements daily. Jevity may exacerbate. Previous colonoscopy showed acute inflammation. - Adjust Lomotil  to 2 tablets four times daily, max 8 tablets/day. - Order stool test for inflammation. - Consider repeat colonoscopy if inflammation indicated.  Hiatal hernia with gastroesophageal reflux symptoms Hiatal hernia with worsening reflux symptoms over the past year, including chest pain, nausea, and reflux. Symptoms may be exacerbated by esophageal thrush. - Perform endoscopy to evaluate esophagus and stomach.  Oral and esophageal candidiasis (thrush) Recurrent oral and esophageal thrush, possibly exacerbated by recent Medrol  Dosepak. Thrush may contribute to reflux symptoms. - Prescribe lozenges for thrush.  Dysphagia due to partial glossectomy and vocal cord exposure Dysphagia due to partial glossectomy and vocal cord exposure, difficulty swallowing solids since 2018 surgery. - Advise continuation of soft food diet.  Recording duration: 18 minutes        Meds ordered this encounter  Medications   diphenoxylate -atropine  (LOMOTIL ) 2.5-0.025 MG tablet    Sig: Take 2 tablets by mouth 4 (four) times daily.    Dispense:  240 tablet    Refill:  5   clotrimazole  (MYCELEX ) 10 MG troche    Sig: Take 1 tablet (10 mg total) by mouth 5 (five) times daily.    Dispense:  150 tablet    Refill:  1   Calprotectin EGD   Subjective:  Discussed the use of AI scribe software for clinical note transcription with the patient, who gave verbal consent to proceed.  Kristen [name] is a 58 year old female with chronic  diarrhea and hiatus hernia who presents with worsening symptoms.  Chronic diarrhea - Chronic diarrhea with fluctuating severity, characterized by episodes of good and bad days - Diarrhea frequency ranges from 2 times per day on good days to 4-5 times per day on bad days - Stool pattern begins with constipation, progresses to loose stools, and ends with watery diarrhea - Frequent diarrhea causes back pain due to repeated bathroom visits - Lomotil  (diphenoxylate  and atropine ) used for management, sometimes requiring up to 12 pills daily, typically limiting to 3 pills at a time - Avoids certain foods, such as greens, that exacerbate diarrhea - Jevity administered via gastrostomy tube for nutrition, which sometimes worsens diarrhea - Colonoscopy history notable for acute inflammation, without evidence of chronic disease - No recent changes in medication regimen  Gastroesophageal reflux and hiatal hernia symptoms - History of hiatal hernia with associated upper chest pain and nausea - Dexilant  used for symptom control, sometimes requiring twice daily dosing - Reflux symptoms, particularly nocturnal, with occasional awakening due to chest pain - Pepto Bismol administered via gastrostomy tube provides symptomatic relief  Dysphagia and oropharyngeal dysfunction - Difficulty swallowing due to missing teeth and partial tongue resection - Able to swallow only on the right side - Intermittent involuntary throat closure related to prior tongue surgery, impairing ability to eat and drink  Recurrent oropharyngeal and esophageal candidiasis - History of tongue surgery in 2018 resulting in recurrent esophageal thrush - Intermittent episodes of thrush since surgery - Currently out of lozenges for thrush management  Menopausal symptoms - Experiencing symptoms of menopause, suspected to  impact current health issues  Recurrent urinary tract infections - History of urinary tract infections, treated with  short courses of antibiotics - Not on chronic antibiotic therapy  Surgical history and functional status - History of tongue surgery in 2018 - History of back surgeries in 2018 and 2022 - History of hand surgery in 2022 - Out of work since surgical interventions          CT abdomen and pelvis 07/24/2020 IMPRESSION: 1. Scattered colonic diverticulosis with no acute diverticulitis. 2. Nonobstructive 5 mm left nephrolithiasis. 3. Hepatic steatosis. 4. Gastrostomy tube in appropriate position. 5.  Aortic Atherosclerosis (ICD10-I70.0). CT enterography 03/22/2019 IMPRESSION: 1. No findings of active Crohn's disease or active mucosal/transmural inflammation. Mild wall thickening in the proximal sigmoid colon is probably primarily due to nondistention. There is also descending colon diverticulosis. 2. Stable appearance of considerable bilateral renal scarring and underlying renal atrophy. 3. Nonobstructive left nephrolithiasis. 4. Mild cardiomegaly. 5. Borderline enlarged bilateral inguinal lymph nodes, probably reactive. 6. The left L4 pedicle screw no longer extends in the pedicle but rather extends into the L3-4 intervertebral disc space. Aortic atherosclerosis     Capsule endoscopy small bowel 04/25/2019-innocent appearing small bowel polyp otherwise normal   EGD 04/24/2019 normal status post gastrostomy         Colonoscopy 04/10/2018  - Decreased sphincter tone found on digital rectal exam. - Erythematous and eroded mucosa in the distal sigmoid colon. Biopsied. - Diverticulosis in the left colon. - The examination was otherwise normal on direct and retroflexion views. - Biopsies were taken with a cold forceps from the right colon and left colon for evaluation of microscopic colitis. Looping would not allow entry of terminal ileum -Cecum had gritty debris that I could not clear but remainder of colon exam was actually good Chief Complaint:  HPI   Wt Readings from Last 3  Encounters:  11/16/23 197 lb (89.4 kg)  08/17/23 191 lb (86.6 kg)  05/12/23 195 lb 4.8 oz (88.6 kg)     Allergies  Allergen Reactions   Sulfa Antibiotics Hives   Tomato Hives and Swelling   Current Meds  Medication Sig   acetaminophen  (TYLENOL ) 500 MG tablet Take 2 tablets (1,000 mg total) by mouth every 8 (eight) hours as needed for moderate pain or mild pain. (Patient taking differently: Place 1,000 mg into feeding tube every 8 (eight) hours as needed for moderate pain (pain score 4-6) or mild pain (pain score 1-3).)   AMBULATORY NON FORMULARY MEDICATION Jevity 1.5 Sig: 6 cans per day, Bolus feeding  60ml syringes #30  Tape and gauze   amLODipine  (NORVASC ) 10 MG tablet Place 1 tablet (10 mg total) into feeding tube daily.   aspirin  81 MG chewable tablet Place 81 mg into feeding tube daily.    baclofen  (LIORESAL ) 10 MG tablet Take 1 tablet (10 mg total) by mouth 2 (two) times daily.   betamethasone  dipropionate 0.05 % cream Apply 1 Application topically 2 (two) times daily as needed (eczema).   butalbital -acetaminophen -caffeine  (FIORICET ) 50-325-40 MG tablet Place 1 tablet into feeding tube every 6 (six) hours as needed for headache.   Calcium  Citrate 200 MG TABS Place 200 mg into feeding tube daily at 12 noon.   cholecalciferol  (VITAMIN D3) 10 MCG (400 UNIT) TABS tablet Place 1 tablet (400 Units total) into feeding tube daily.   cyanocobalamin  (VITAMIN B12) 500 MCG tablet Take 1 tablet (500 mcg total) by mouth daily. (Patient taking differently: Place 500 mcg into feeding tube daily.)  dexlansoprazole  (DEXILANT ) 60 MG capsule ADMINISTER 1 CAPSULE BY MOUTH VIA FEEDING TUBE DAILY (Patient taking differently: Place 60 mg into feeding tube daily.)   diclofenac  (VOLTAREN ) 75 MG EC tablet TAKE 1 TABLET(75 MG) BY MOUTH TWICE DAILY WITH A MEAL (Patient taking differently: Take 75 mg by mouth 2 (two) times daily as needed for mild pain (pain score 1-3) or moderate pain (pain score 4-6).)    diphenoxylate -atropine  (LOMOTIL ) 2.5-0.025 MG tablet Take 2 tablets by mouth in the morning and at bedtime.   ferrous sulfate  220 (44 Fe) MG/5ML solution Take 6.8 ml via feeding tube daily   folic acid  (FOLVITE ) 400 MCG tablet Place 400 mcg into feeding tube daily.   ketoconazole (NIZORAL) 2 % cream Apply 1 Application topically 2 (two) times daily as needed for irritation.   lidocaine  (LMX) 4 % cream Apply 1 application  topically as needed (to affected sites- for pain).   loratadine  (CLARITIN ) 10 MG tablet Place 10 mg into feeding tube daily.   methylPREDNISolone  (MEDROL  DOSEPAK) 4 MG TBPK tablet Take as prescribed on the box   naproxen  (NAPROSYN ) 500 MG tablet Take 500 mg by mouth every 12 (twelve) hours as needed for mild pain (pain score 1-3) or moderate pain (pain score 4-6).   ondansetron  (ZOFRAN -ODT) 4 MG disintegrating tablet Take 1 tablet (4 mg total) by mouth every 8 (eight) hours as needed for nausea or vomiting.   polyethylene glycol (MIRALAX  / GLYCOLAX ) 17 g packet Place 17 g into feeding tube daily as needed for moderate constipation.   potassium chloride  (KLOR-CON ) 20 MEQ packet Place 20 mEq into feeding tube daily.   sertraline  (ZOLOFT ) 25 MG tablet TAKE 1 TABLET BY MOUTH EVERY DAY (Patient taking differently: Place 25 mg into feeding tube daily.)   TERBINAFINE EX Apply 1 drop topically at bedtime. Terbinafine 1000mg  Dmso susp. Apply 1 drop to all toenails nightly at bedtime.   Past Medical History:  Diagnosis Date   Allergy    Anemia    Iron deficiency   Anxiety    Arthritis    back, left shoulder   Blood transfusion without reported diagnosis    Chicken pox    Depression    Diarrhea    takes Imodium  daily   GERD (gastroesophageal reflux disease)    H/O hiatal hernia    Headache(784.0)    History of kidney stones    1996ish   History of radiation therapy 11/16/16- 01/01/17   Base of Tongue/ 66 gy in 33 fractions/ Dose: 2 Gy   Hypertension    Iron deficiency anemia  12/11/2018   Migraine    none for 5 years (as of 01/13/16)   OSA (obstructive sleep apnea) 10/13/2015   unable to get cpap, plans to get one in 2018   Pneumonia    Restless legs    Scoliosis    Shingles 08/28/2013   Shortness of breath    with exertion   Sleep apnea    Tongue cancer (HCC)    tongue cancer   Past Surgical History:  Procedure Laterality Date   BACK SURGERY  07/21/1978   CARPOMETACARPEL SUSPENSION PLASTY Left 01/21/2021   Procedure: Left Thumb Ligament Carpometacarpal Arthroplasty;  Surgeon: Romona Harari, MD;  Location: [facility] OR;  Service: Orthopedics;  Laterality: Left;   CHOLECYSTECTOMY N/A 08/15/2013   Procedure: LAPAROSCOPIC CHOLECYSTECTOMY WITH INTRAOPERATIVE CHOLANGIOGRAM;  Surgeon: Lynwood MALVA Pina, MD;  Location: [facility] OR;  Service: General;  Laterality: N/A;   COLONOSCOPY N/A 01/09/2013   Procedure:  COLONOSCOPY;  Surgeon: Belvie JONETTA Just, MD;  Location[facility] ENDOSCOPY;  Service: Endoscopy;  Laterality: N/A;   COLONOSCOPY  03/2018   DIRECT LARYNGOSCOPY  07/2016   Dr. Lauralee [facility]   ESOPHAGOGASTRODUODENOSCOPY  03/2018   ESOPHAGOGASTRODUODENOSCOPY (EGD) WITH PROPOFOL  N/A 04/24/2019   Procedure: ESOPHAGOGASTRODUODENOSCOPY (EGD) WITH PROPOFOL ;  Surgeon: Avram Lupita BRAVO, MD;  Location: [facility] ENDOSCOPY;  Service: Endoscopy;  Laterality: N/A;   GASTROSTOMY TUBE PLACEMENT  09/27/2016   GIVENS CAPSULE STUDY N/A 04/24/2019   Procedure: GIVENS CAPSULE STUDY;  Surgeon: Avram Lupita BRAVO, MD;  Location: [facility] ENDOSCOPY;  Service: Endoscopy;  Laterality: N/A;   HERNIA REPAIR Left 1981   IR PATIENT EVAL TECH 0-60 MINS  12/13/2016   IR PATIENT EVAL TECH 0-60 MINS  04/11/2017   IR PATIENT EVAL TECH 0-60 MINS  03/24/2018   IR PATIENT EVAL TECH 0-60 MINS  11/23/2018   IR PATIENT EVAL TECH 0-60 MINS  05/28/2019   IR PATIENT EVAL TECH 0-60 MINS  12/17/2022   IR REPLACE G-TUBE SIMPLE WO FLUORO  12/12/2017   IR REPLACE G-TUBE SIMPLE WO FLUORO  03/29/2018   IR REPLACE G-TUBE SIMPLE WO FLUORO  06/29/2018    IR REPLACE G-TUBE SIMPLE WO FLUORO  11/02/2018   IR REPLACE G-TUBE SIMPLE WO FLUORO  03/08/2019   IR REPLACE G-TUBE SIMPLE WO FLUORO  07/02/2019   IR REPLACE G-TUBE SIMPLE WO FLUORO  11/30/2019   IR REPLACE G-TUBE SIMPLE WO FLUORO  12/27/2019   IR REPLACE G-TUBE SIMPLE WO FLUORO  05/27/2020   IR REPLACE G-TUBE SIMPLE WO FLUORO  07/09/2020   IR REPLACE G-TUBE SIMPLE WO FLUORO  10/23/2021   IR REPLACE G-TUBE SIMPLE WO FLUORO  08/19/2022   IR REPLACE G-TUBE SIMPLE WO FLUORO  07/13/2023   IR REPLC GASTRO/COLONIC TUBE PERCUT W/FLUORO  03/01/2022   MODIFIED RADICAL NECK DISSECTION Left 09/27/2016   Levels 1 & 2   PARTIAL GLOSSECTOMY Left 09/27/2016   Left hemi partial glossectomy   SPINE SURGERY  01/20/2016   fusion   TONSILLECTOMY     tracheotomy  09/27/2016   TUBAL LIGATION  06/1988   Social History   Social History Narrative   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   Review of Systems   Objective:   Physical Exam @BP  122/70   Pulse 81   Ht 5' 7 (1.702 m)   Wt 197 lb (89.4 kg)   LMP 11/15/2013   BMI 30.85 kg/m @  General:  NAD Eyes:   Anicteric Mouth:  Deformed post op tongue w/ mild thrush Lungs:  clear Heart::  S1S2 no rubs, murmurs or gallops Abdomen:  soft and nontender, BS+ gastrostomy LUQ Ext:   no edema, cyanosis or clubbing    Data Reviewed:

## 2023-11-16 NOTE — Patient Instructions (Signed)
 Your provider has requested that you go to the basement level for lab work before leaving today. Press B on the elevator. The lab is located at the first door on the left as you exit the elevator.  You have been scheduled for an endoscopy. Please follow written instructions given to you at your visit today.  If you use inhalers (even only as needed), please bring them with you on the day of your procedure.  We have sent  medications to your pharmacy for you to pick up at your convenience.  I appreciate the opportunity to care for you. Lupita Commander, MD, NOLIA _______________________________

## 2023-11-21 ENCOUNTER — Encounter: Payer: Self-pay | Admitting: Radiology

## 2023-11-22 ENCOUNTER — Encounter: Admitting: Internal Medicine

## 2023-11-22 ENCOUNTER — Telehealth: Payer: Self-pay | Admitting: Internal Medicine

## 2023-11-22 NOTE — Telephone Encounter (Signed)
 Made a second attempt to reach pt. No answer. Left message for pt to call back to speak with office regarding symptoms and to get EGD scheduled sooner if she would like.

## 2023-11-22 NOTE — Telephone Encounter (Signed)
 Patient rescheduling today's procedure due to transportation. Also requesting f/u call due to having abdominal pain. Please advise.

## 2023-11-22 NOTE — Telephone Encounter (Signed)
 Attempted to call pt to discuss c/o abdominal pain and offer to get her EGD scheduled sooner than 12/20/23. No answer. Left message. Will attempt to call again.

## 2023-11-30 ENCOUNTER — Encounter: Payer: Self-pay | Admitting: Orthopedic Surgery

## 2023-11-30 DIAGNOSIS — M25551 Pain in right hip: Secondary | ICD-10-CM

## 2023-12-01 NOTE — Progress Notes (Signed)
 [facility] Cancer Follow up:    Kristen Deidra Fox, MD [address] 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 Lauralee performed left hemiglossectomy, resection of base of tongue cancer, pharyngoplasty, left selective neck dissection, levels I and II, tracheotomy, and G tube placement. She has questionable margins that are technically negative.   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 14 lymph nodes from the left level II neck, 2 nodes from the left submandibular region, and 4 nodes from the submental region were negative for malignancy. Carcinoma was present focally at the lateral edge of the left posterior tongue, 4.6 cm in greatest extent. The lateral margin was sent separately as specimen B, which was negative for carcinoma. Perineural invasion was appreciated but lymphovascular invasion was not identified.   11/16/2016 - 01/01/2017 Radiation Therapy   Site/dose:   Base of tongue tumor bed/ 66 Gy in 33 fractions      CURRENT THERAPY: observation  INTERVAL  HISTORY:  Discussed the use of AI scribe software for clinical note transcription with the patient, who gave verbal consent to proceed.  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  She underwent left hemiglossectomy, resection of the base of the tongue, pharyngoplasty, selective neck dissection, tracheotomy, and G-tube placement with negative margins. She completed adjuvant radiation therapy in 2018. Annual thyroid  ultrasounds are performed, with the latest on May 03, 2023, showing three nodules requiring surveillance.  In September, she experienced a PEG tube malfunction and is scheduled for a tube change on December 10th. She manages reflux and chronic diarrhea with outpatient GI care. Her condition fluctuates with dietary habits, and she currently swallows soft foods.  She recently developed a urinary tract infection and is on antibiotics, with a urine check scheduled for the afternoon. She feels 'a little tired' but overall 'doing good'.   Patient Active Problem List   Diagnosis Date Noted   Urinary tract infection 08/17/2023   Hypokalemia 08/17/2023   S/P percutaneous endoscopic gastrostomy (PEG) tube placement (HCC) 08/17/2023   Acute encephalopathy 04/22/2023   Enteritis 01/12/2023   Generalized weakness 07/18/2022   Dehydration 07/18/2022   Tension headache 07/18/2022   Depression 07/18/2022   History of tongue cancer 07/18/2022   PEG tube malfunction (HCC) 02/28/2022   Protein-calorie malnutrition, severe 04/29/2021   Pseudoarthrosis of lumbar spine 04/28/2021   Fusion of spine of lumbar region 04/28/2021   Pseudarthrosis after fusion or arthrodesis 04/20/2021    Class: Chronic   Tenosynovitis, de Quervain 12/14/2020   Arthritis of carpometacarpal (  CMC) joint of left thumb 12/14/2020   Aortic atherosclerosis 08/20/2020   Hyperkalemia  07/24/2020   Alcohol use 08/05/2019   Bilateral lower extremity edema 08/05/2019   Peripheral edema 03/12/2019   Iron deficiency anemia 12/11/2018   Loosening of hardware in spine    Other spondylosis with radiculopathy, lumbar region    History of lumbar spinal fusion 11/06/2018   UTI (urinary tract infection) 05/15/2017   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   Malignant neoplasm of base of tongue (HCC) 10/29/2016   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  MEDICAL HISTORY: Past Medical History:  Diagnosis Date   Allergy    Anemia    Iron deficiency   Anxiety    Arthritis    back, left shoulder   Blood transfusion without reported diagnosis    Chicken pox    Depression    Diarrhea    takes Imodium  daily   GERD (gastroesophageal reflux disease)    H/O hiatal hernia    Headache(784.0)    History of kidney stones    1996ish   History of radiation therapy 11/16/16- 01/01/17   Base of Tongue/ 66 gy in 33 fractions/ Dose: 2 Gy   Hypertension    Iron deficiency anemia 12/11/2018   Migraine    none for 5 years (as of 01/13/16)   OSA (obstructive sleep apnea) 10/13/2015   unable to get cpap, plans to get one in 2018   Pneumonia    Restless legs    Scoliosis    Shingles 08/28/2013   Shortness of breath    with exertion   Sleep apnea    Tongue cancer (HCC)    tongue cancer    SURGICAL  HISTORY: Past Surgical History:  Procedure Laterality Date   BACK SURGERY  07/21/1978   CARPOMETACARPEL SUSPENSION PLASTY Left 01/21/2021   Procedure: Left Thumb Ligament Carpometacarpal Arthroplasty;  Surgeon: Romona Harari, MD;  Location: [facility] OR;  Service: Orthopedics;  Laterality: Left;   CHOLECYSTECTOMY N/A 08/15/2013   Procedure: LAPAROSCOPIC CHOLECYSTECTOMY WITH INTRAOPERATIVE CHOLANGIOGRAM;  Surgeon: Lynwood MALVA Pina, MD;  Location[facility] OR;  Service: General;  Laterality: N/A;   COLONOSCOPY N/A 01/09/2013   Procedure: COLONOSCOPY;  Surgeon: Belvie JONETTA Just, MD;  Location: [facility] ENDOSCOPY;  Service: Endoscopy;  Laterality: N/A;   COLONOSCOPY  03/2018   DIRECT LARYNGOSCOPY  07/2016   [facility]   ESOPHAGOGASTRODUODENOSCOPY  03/2018   ESOPHAGOGASTRODUODENOSCOPY (EGD) WITH PROPOFOL  N/A 04/24/2019   Procedure: ESOPHAGOGASTRODUODENOSCOPY (EGD) WITH PROPOFOL ;  Surgeon: Avram Lupita BRAVO, MD;  Location: [facility] ENDOSCOPY;  Service: Endoscopy;  Laterality: N/A;   GASTROSTOMY TUBE PLACEMENT  09/27/2016   GIVENS CAPSULE STUDY N/A 04/24/2019   Procedure: GIVENS CAPSULE STUDY;  Surgeon: Avram Lupita BRAVO, MD;  Location: [facility] ENDOSCOPY;  Service: Endoscopy;  Laterality: N/A;   HERNIA REPAIR Left 1981   IR PATIENT EVAL TECH 0-60 MINS  12/13/2016  IR PATIENT EVAL TECH 0-60 MINS  04/11/2017   IR PATIENT EVAL TECH 0-60 MINS  03/24/2018   IR PATIENT EVAL TECH 0-60 MINS  11/23/2018   IR PATIENT EVAL TECH 0-60 MINS  05/28/2019   IR PATIENT EVAL TECH 0-60 MINS  12/17/2022   IR REPLACE G-TUBE SIMPLE WO FLUORO  12/12/2017   IR REPLACE G-TUBE SIMPLE WO FLUORO  03/29/2018   IR REPLACE G-TUBE SIMPLE WO FLUORO  06/29/2018   IR REPLACE G-TUBE SIMPLE WO FLUORO  11/02/2018   IR REPLACE G-TUBE SIMPLE WO FLUORO  03/08/2019   IR REPLACE G-TUBE SIMPLE WO FLUORO  07/02/2019   IR REPLACE G-TUBE SIMPLE WO FLUORO  11/30/2019   IR REPLACE G-TUBE SIMPLE WO FLUORO  12/27/2019   IR REPLACE G-TUBE SIMPLE WO FLUORO  05/27/2020   IR REPLACE G-TUBE  SIMPLE WO FLUORO  07/09/2020   IR REPLACE G-TUBE SIMPLE WO FLUORO  10/23/2021   IR REPLACE G-TUBE SIMPLE WO FLUORO  08/19/2022   IR REPLACE G-TUBE SIMPLE WO FLUORO  07/13/2023   IR REPLC GASTRO/COLONIC TUBE PERCUT W/FLUORO  03/01/2022   MODIFIED RADICAL NECK DISSECTION Left 09/27/2016   Levels 1 & 2   PARTIAL GLOSSECTOMY Left 09/27/2016   Left hemi partial glossectomy   SPINE SURGERY  01/20/2016   fusion   TONSILLECTOMY     tracheotomy  09/27/2016   TUBAL LIGATION  06/1988    SOCIAL HISTORY: Social History   Socioeconomic History   Marital status: Widowed    Spouse name: Not on file   Number of children: 3   Years of education: 39   Highest education level: 12th grade  Occupational History   Occupation: Administrator, Arts  Tobacco Use   Smoking status: Never   Smokeless tobacco: Never  Vaping Use   Vaping status: Never Used  Substance and Sexual Activity   Alcohol use: Yes    Alcohol/week: 7.0 standard drinks of alcohol    Types: 7 Cans of beer per week    Comment: occasionally   Drug use: No   Sexual activity: Yes    Birth control/protection: Surgical  Other Topics Concern   Not on file  Social History Narrative   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[employer], 3 children   Never smoker no drug use occasional alcohol averaging about 7 drinks a week   fun: Play with her grandkids, read   Social Drivers of Health   Financial Resource Strain: Low Risk  (12/02/2023)   Overall Financial Resource Strain (CARDIA)    Difficulty of Paying Living Expenses: Not hard at all  Food Insecurity: Food Insecurity Present (12/02/2023)   Hunger Vital Sign    Worried About Running Out of Food in the Last Year: Sometimes true    Ran Out of Food in the Last Year: Sometimes true  Transportation Needs: No Transportation Needs (12/02/2023)   PRAPARE - Administrator, Civil Service (Medical): No    Lack of Transportation (Non-Medical): No  Physical Activity:  Insufficiently Active (12/02/2023)   Exercise Vital Sign    Days of Exercise per Week: 1 day    Minutes of Exercise per Session: 10 min  Stress: No Stress Concern Present (12/02/2023)   [employer] of Occupational Health - Occupational Stress Questionnaire    Feeling of Stress: Only a little  Social Connections: Moderately Isolated (12/02/2023)   Social Connection and Isolation Panel    Frequency of Communication with Friends and Family:  Three times a week    Frequency of Social Gatherings with Friends and Family: Three times a week    Attends Religious Services: 1 to 4 times per year    Active Member of Clubs or Organizations: No    Attends Banker Meetings: Never    Marital Status: Never married  Intimate Partner Violence: Not At Risk (12/02/2023)   Humiliation, Afraid, Rape, and Kick questionnaire    Fear of Current or Ex-Partner: No    Emotionally Abused: No    Physically Abused: No    Sexually Abused: No    FAMILY HISTORY: Family History  Problem Relation Age of Onset   Heart disease Father    Heart attack Father    Hypertension Mother    Arthritis Mother    Diabetes Maternal Grandmother    Diabetes Paternal Grandmother    Diabetes Maternal Uncle    Colon polyps Neg Hx    Colon cancer Neg Hx    Esophageal cancer Neg Hx    Stomach cancer Neg Hx    Rectal cancer Neg Hx     Review of Systems  Constitutional:  Negative for appetite change, chills, fatigue, fever and unexpected weight change.  HENT:   Negative for hearing loss, lump/mass and trouble swallowing.   Eyes:  Negative for eye problems and icterus.  Respiratory:  Negative for chest tightness, cough and shortness of breath.   Cardiovascular:  Negative for chest pain, leg swelling and palpitations.  Gastrointestinal:  Negative for abdominal distention, abdominal pain, constipation, diarrhea, nausea and vomiting.  Endocrine: Negative for hot flashes.  Genitourinary:  Negative for difficulty  urinating.   Musculoskeletal:  Negative for arthralgias.  Skin:  Negative for itching and rash.  Neurological:  Negative for dizziness, extremity weakness, headaches and numbness.  Hematological:  Negative for adenopathy. Does not bruise/bleed easily.  Psychiatric/Behavioral:  Negative for depression. The patient is not nervous/anxious.       PHYSICAL EXAMINATION    Vitals:   12/02/23 0943  BP: 133/81  Pulse: 76  Resp: 18  Temp: 98.1 F (36.7 C)  SpO2: 97%    Physical Exam Constitutional:      General: She is not in acute distress.    Appearance: Normal appearance. She is not toxic-appearing.  HENT:     Head: Normocephalic and atraumatic.     Mouth/Throat:     Mouth: Mucous membranes are moist.     Pharynx: Oropharynx is clear. No oropharyngeal exudate or posterior oropharyngeal erythema.  Eyes:     General: No scleral icterus. Cardiovascular:     Rate and Rhythm: Normal rate and regular rhythm.     Pulses: Normal pulses.     Heart sounds: Normal heart sounds.  Pulmonary:     Effort: Pulmonary effort is normal.     Breath sounds: Normal breath sounds.  Abdominal:     General: Abdomen is flat. Bowel sounds are normal. There is no distension.     Palpations: Abdomen is soft.     Tenderness: There is no abdominal tenderness.  Musculoskeletal:        General: No swelling.     Cervical back: Neck supple.  Lymphadenopathy:     Cervical: No cervical adenopathy.  Skin:    General: Skin is warm and dry.     Findings: No rash.  Neurological:     General: No focal deficit present.     Mental Status: She is alert.  Psychiatric:  Mood and Affect: Mood normal.        Behavior: Behavior normal.       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 - Continue annual thyroid  ultrasound for surveillance of thyroid   nodules. - Monitor for long-term side effects of radiation therapy, including carotid artery plaque and thyroid  function. - Encouraged follow-up with ENT for swallowing evaluation in two weeks.  PEG tube malfunction, scheduled replacement Recent ER visit for PEG tube malfunction. Scheduled for PEG tube replacement on December 10th. - Proceed with scheduled PEG tube replacement on December 10th.  Thyroid  nodules under annual surveillance Three thyroid  nodules identified on ultrasound in April 2025, requiring annual surveillance. - Ordered thyroid  ultrasound for annual surveillance of nodules.  History of radiation therapy with monitoring for late effects (including carotid artery and thyroid  surveillance) Radiation therapy completed in 2018. Monitoring for late effects including carotid artery plaque and thyroid  function. - Ordered carotid artery ultrasound to monitor for plaque formation. - Ordered thyroid  function tests to monitor thyroid  levels.  RTC in 1 year for continued long term surveillance.      All questions were answered. The patient knows to call the clinic with any problems, questions or concerns. We can certainly see the patient much sooner if necessary.  Total encounter time:30 minutes*in face-to-face visit time, chart review, lab review, care coordination, order entry, and documentation of the encounter time.    Morna Kendall, NP 12/05/23 12:41 PM Medical Oncology and Hematology [facility] [address] [place], [place] [address] Tel. [phone]    Fax. [phone]  *Total Encounter Time as defined by the Centers for Medicare and Medicaid Services includes, in addition to the face-to-face time of a patient visit (documented in the note above) non-face-to-face time: obtaining and reviewing outside history, ordering and reviewing medications, tests or procedures, care coordination (communications with other health care professionals or caregivers)  and documentation in the medical record.

## 2023-12-02 ENCOUNTER — Inpatient Hospital Stay

## 2023-12-02 ENCOUNTER — Inpatient Hospital Stay: Payer: Medicare Other | Attending: Adult Health | Admitting: Adult Health

## 2023-12-02 ENCOUNTER — Encounter: Payer: Self-pay | Admitting: Adult Health

## 2023-12-02 VITALS — BP 133/81 | HR 76 | Temp 98.1°F | Resp 18 | Wt 187.9 lb

## 2023-12-02 DIAGNOSIS — Z923 Personal history of irradiation: Secondary | ICD-10-CM | POA: Insufficient documentation

## 2023-12-02 DIAGNOSIS — Z8249 Family history of ischemic heart disease and other diseases of the circulatory system: Secondary | ICD-10-CM | POA: Insufficient documentation

## 2023-12-02 DIAGNOSIS — Z8261 Family history of arthritis: Secondary | ICD-10-CM | POA: Insufficient documentation

## 2023-12-02 DIAGNOSIS — I10 Essential (primary) hypertension: Secondary | ICD-10-CM | POA: Insufficient documentation

## 2023-12-02 DIAGNOSIS — Z882 Allergy status to sulfonamides status: Secondary | ICD-10-CM | POA: Diagnosis not present

## 2023-12-02 DIAGNOSIS — G2581 Restless legs syndrome: Secondary | ICD-10-CM | POA: Insufficient documentation

## 2023-12-02 DIAGNOSIS — N390 Urinary tract infection, site not specified: Secondary | ICD-10-CM | POA: Diagnosis not present

## 2023-12-02 DIAGNOSIS — K9423 Gastrostomy malfunction: Secondary | ICD-10-CM | POA: Diagnosis not present

## 2023-12-02 DIAGNOSIS — T66XXXD Radiation sickness, unspecified, subsequent encounter: Secondary | ICD-10-CM

## 2023-12-02 DIAGNOSIS — K529 Noninfective gastroenteritis and colitis, unspecified: Secondary | ICD-10-CM | POA: Diagnosis not present

## 2023-12-02 DIAGNOSIS — G4733 Obstructive sleep apnea (adult) (pediatric): Secondary | ICD-10-CM | POA: Diagnosis not present

## 2023-12-02 DIAGNOSIS — Z79899 Other long term (current) drug therapy: Secondary | ICD-10-CM | POA: Insufficient documentation

## 2023-12-02 DIAGNOSIS — Z9089 Acquired absence of other organs: Secondary | ICD-10-CM | POA: Diagnosis not present

## 2023-12-02 DIAGNOSIS — C01 Malignant neoplasm of base of tongue: Secondary | ICD-10-CM | POA: Insufficient documentation

## 2023-12-02 DIAGNOSIS — Z87440 Personal history of urinary (tract) infections: Secondary | ICD-10-CM | POA: Insufficient documentation

## 2023-12-02 DIAGNOSIS — M47896 Other spondylosis, lumbar region: Secondary | ICD-10-CM | POA: Insufficient documentation

## 2023-12-02 DIAGNOSIS — K219 Gastro-esophageal reflux disease without esophagitis: Secondary | ICD-10-CM | POA: Insufficient documentation

## 2023-12-02 DIAGNOSIS — Z5941 Food insecurity: Secondary | ICD-10-CM | POA: Diagnosis not present

## 2023-12-02 DIAGNOSIS — M419 Scoliosis, unspecified: Secondary | ICD-10-CM | POA: Insufficient documentation

## 2023-12-02 DIAGNOSIS — Z8701 Personal history of pneumonia (recurrent): Secondary | ICD-10-CM | POA: Insufficient documentation

## 2023-12-02 DIAGNOSIS — Z9049 Acquired absence of other specified parts of digestive tract: Secondary | ICD-10-CM | POA: Diagnosis not present

## 2023-12-02 DIAGNOSIS — Z87442 Personal history of urinary calculi: Secondary | ICD-10-CM | POA: Insufficient documentation

## 2023-12-02 DIAGNOSIS — Z833 Family history of diabetes mellitus: Secondary | ICD-10-CM | POA: Diagnosis not present

## 2023-12-02 LAB — TSH: TSH: 0.651 u[IU]/mL (ref 0.350–4.500)

## 2023-12-05 ENCOUNTER — Ambulatory Visit: Payer: Self-pay

## 2023-12-08 ENCOUNTER — Other Ambulatory Visit: Payer: Self-pay | Admitting: Internal Medicine

## 2023-12-16 ENCOUNTER — Other Ambulatory Visit: Payer: Self-pay | Admitting: Medical Genetics

## 2023-12-19 ENCOUNTER — Telehealth: Payer: Self-pay

## 2023-12-19 NOTE — Telephone Encounter (Signed)
 Relayed MyChart message to patient.  Informed patient that thyroid  results were normal.

## 2023-12-20 ENCOUNTER — Encounter: Payer: Self-pay | Admitting: Internal Medicine

## 2023-12-20 ENCOUNTER — Ambulatory Visit: Admitting: Internal Medicine

## 2023-12-20 VITALS — BP 125/76 | HR 66 | Temp 97.3°F | Resp 14 | Ht 67.0 in | Wt 186.0 lb

## 2023-12-20 DIAGNOSIS — B370 Candidal stomatitis: Secondary | ICD-10-CM

## 2023-12-20 DIAGNOSIS — K2100 Gastro-esophageal reflux disease with esophagitis, without bleeding: Secondary | ICD-10-CM

## 2023-12-20 DIAGNOSIS — R1319 Other dysphagia: Secondary | ICD-10-CM

## 2023-12-20 DIAGNOSIS — K319 Disease of stomach and duodenum, unspecified: Secondary | ICD-10-CM | POA: Diagnosis not present

## 2023-12-20 DIAGNOSIS — R1310 Dysphagia, unspecified: Secondary | ICD-10-CM | POA: Diagnosis not present

## 2023-12-20 DIAGNOSIS — K219 Gastro-esophageal reflux disease without esophagitis: Secondary | ICD-10-CM

## 2023-12-20 MED ORDER — SODIUM CHLORIDE 0.9 % IV SOLN: 500.0000 mL | Freq: Once | INTRAVENOUS | Status: DC

## 2023-12-20 NOTE — Progress Notes (Signed)
Updated medical record with pt

## 2023-12-20 NOTE — Patient Instructions (Addendum)
 The stomach looked irritated so I took biopsies to see if there is an infection or other problems.  There was a very slight bit of reflux inflammation where the esophagus and stomach meet.  I did dilate the esophagus to see if that helps you swallow better.  Once I get the results I will let you know what to do next.  I appreciate the opportunity to care for you. Kristen CHARLENA Commander, MD, [facility]   Please see Post Dilation Diet YOU HAD AN ENDOSCOPIC PROCEDURE TODAY AT THE  ENDOSCOPY CENTER:   Refer to the procedure report that was given to you for any specific questions about what was found during the examination.  If the procedure report does not answer your questions, please call your gastroenterologist to clarify.  If you requested that your care partner not be given the details of your procedure findings, then the procedure report has been included in a sealed envelope for you to review at your convenience later.  YOU SHOULD EXPECT: Some feelings of bloating in the abdomen. Passage of more gas than usual.  Walking can help get rid of the air that was put into your GI tract during the procedure and reduce the bloating. If you had a lower endoscopy (such as a colonoscopy or flexible sigmoidoscopy) you may notice spotting of blood in your stool or on the toilet paper. If you underwent a bowel prep for your procedure, you may not have a normal bowel movement for a few days.  Please Note:  You might notice some irritation and congestion in your nose or some drainage.  This is from the oxygen used during your procedure.  There is no need for concern and it should clear up in a day or so.  SYMPTOMS TO REPORT IMMEDIATELY:  Following upper endoscopy (EGD)  Vomiting of blood or coffee ground material  New chest pain or pain under the shoulder blades  Painful or persistently difficult swallowing  New shortness of breath  Fever of 100F or higher  Black, tarry-looking stools  For urgent or emergent  issues, a gastroenterologist can be reached at any hour by calling [phone]. Do not use MyChart messaging for urgent concerns.    DIET:  Please see Post Dilation Diet provided. Drink plenty of fluids but you should avoid alcoholic beverages for 24 hours.  ACTIVITY:  You should plan to take it easy for the rest of today and you should NOT DRIVE or use heavy machinery until tomorrow (because of the sedation medicines used during the test).    FOLLOW UP: Our staff will call the number listed on your records the next business day following your procedure.  We will call around 7:15- 8:00 am to check on you and address any questions or concerns that you may have regarding the information given to you following your procedure. If we do not reach you, we will leave a message.     If any biopsies were taken you will be contacted by phone or by letter within the next 1-3 weeks.  Please call us  at [phone] if you have not heard about the biopsies in 3 weeks.    SIGNATURES/CONFIDENTIALITY: You and/or your care partner have signed paperwork which will be entered into your electronic medical record.  These signatures attest to the fact that that the information above on your After Visit Summary has been reviewed and is understood.  Full responsibility of the confidentiality of this discharge information lies with you and/or  your care-partner.

## 2023-12-20 NOTE — Op Note (Signed)
 [facility] Patient Name: Kristen [name] Procedure Date: 12/20/2023 2:47 PM MRN: [id number] Endoscopist: Lupita FORBES Commander , MD, [id number] Age: 58 Referring MD:  Date of Birth: Jan 12, 1966 Gender: Female Account #: [id number] Procedure:                Upper GI endoscopy Indications:              Epigastric abdominal pain, Dysphagia, Heartburn Medicines:                Monitored Anesthesia Care Procedure:                Pre-Anesthesia Assessment:                           - Prior to the procedure, a History and Physical                            was performed, and patient medications and                            allergies were reviewed. The patient's tolerance of                            previous anesthesia was also reviewed. The risks                            and benefits of the procedure and the sedation                            options and risks were discussed with the patient.                            All questions were answered, and informed consent                            was obtained. Prior Anticoagulants: The patient has                            taken no anticoagulant or antiplatelet agents. ASA                            Grade Assessment: III - A patient with severe                            systemic disease. After reviewing the risks and                            benefits, the patient was deemed in satisfactory                            condition to undergo the procedure.                           After obtaining informed consent, the endoscope was  passed under direct vision. Throughout the                            procedure, the patient's blood pressure, pulse, and                            oxygen saturations were monitored continuously. The                            GIF [id number] #[id number] was introduced through the                            mouth, and advanced to the second part of duodenum.                             The upper GI endoscopy was accomplished without                            difficulty. The patient tolerated the procedure                            well. Scope In: Scope Out: Findings:                 LA Grade A (one or more mucosal breaks less than 5                            mm, not extending between tops of 2 mucosal folds)                            esophagitis with no bleeding was found at the                            gastroesophageal junction.                           Patchy moderately erythematous mucosa without                            bleeding was found in the gastric antrum. Biopsies                            were taken with a cold forceps for histology.                            Verification of patient identification for the                            specimen was done. Estimated blood loss was minimal.                           There was evidence of an intact gastrostomy with a  patent G-tube present on the anterior wall of the                            stomach and on the greater curvature of the stomach.                           The exam was otherwise without abnormality.                           The cardia and gastric fundus were normal on                            retroflexion.                           The scope was withdrawn. Dilation was performed in                            the entire esophagus with a Maloney dilator with                            mild resistance at 54 Fr. Estimated blood loss:                            none.                           The gastroesophageal flap valve was visualized                            endoscopically and classified as Hill Grade II                            (fold present, opens with respiration). Complications:            No immediate complications. Estimated Blood Loss:     Estimated blood loss was minimal. Impression:               - LA Grade A reflux esophagitis with no bleeding.                            - Erythematous mucosa in the antrum. Biopsied.                           - Intact gastrostomy with a patent G-tube present.                           - Gastroesophageal flap valve classified as Hill                            Grade II (fold present, opens with respiration).                           - The examination was otherwise normal                           -  Dilation performed in the entire esophagus. 54 Fr                            dilation - no mucosal dilation effect seen                            (dysphagia likely at least in part if not all due                            to partial glossectomy) Recommendation:           - Patient has a contact number available for                            emergencies. The signs and symptoms of potential                            delayed complications were discussed with the                            patient. Return to normal activities tomorrow.                            Written discharge instructions were provided to the                            patient.                           - Continue present medications.                           - Follow an antireflux regimen. . This includes:                           - Do not lie down for at least 3 to 4 hours after                            meals.                           - Raise the head of the bed 4 to 6 inches.                           - Decrease excess weight.                           - Avoid citrus juices and other acidic foods,                            alcohol, chocolate, mints, coffee and other                            caffeinated beverages, carbonated beverages, fatty  and fried foods.                           - Avoid tight-fitting clothing.                           - Avoid cigarettes and other tobacco products.                           - Clear liquids x 1 hour then soft foods rest of                            day. Start  prior diet tomorrow. Lupita FORBES Commander, MD 12/20/2023 3:23:46 PM This report has been signed electronically.

## 2023-12-20 NOTE — Progress Notes (Signed)
 Sedate, gd SR, tolerated procedure well, VSS, report to RN

## 2023-12-20 NOTE — Progress Notes (Signed)
 [facility] History and Physical   Primary Care Physician:  Sigrid Deidra Fox, MD   Reason for Procedure:    Encounter Diagnoses  Name Primary?   Gastroesophageal reflux disease, unspecified whether esophagitis present Yes   Esophageal dysphagia    Oral thrush      Plan:    EGD, possible esophageal dilation   The patient was provided an opportunity to ask questions and all were answered. The patient agreed with the plan.   HPI: Kristen [name] is a 58 y.o. female   Hiatal hernia with worsening reflux symptoms over the past year, including chest pain, nausea, and reflux. Symptoms may be exacerbated by possible esophageal candidiasis - Perform EGD to evaluate The risks and benefits as well as alternatives of endoscopic procedure(s) have been discussed and reviewed. All questions answered. The patient agrees to proceed.       Recurrent oral and possibly esophageal thrush, possibly exacerbated by recent Medrol  Dosepak. Thrush may contribute to reflux symptoms. - Prescribe lozenges for thrush.     Dysphagia due to partial glossectomy and vocal cord exposure, difficulty swallowing solids since 2018 surgery. - Advise continuation of soft food diet. Past Medical History:  Diagnosis Date   Allergy    seasonal   Anemia    Iron deficiency   Anxiety    Arthritis    back, left shoulder   Blood transfusion without reported diagnosis    Chicken pox    Depression    Diarrhea    takes Imodium  daily   GERD (gastroesophageal reflux disease)    H/O hiatal hernia    Headache(784.0)    History of kidney stones    1996ish   History of radiation therapy 11/16/16- 01/01/17   Base of Tongue/ 66 gy in 33 fractions/ Dose: 2 Gy   Hypertension    Iron deficiency anemia 12/11/2018   Migraine    none for 5 years (as of 01/13/16)   OSA (obstructive sleep apnea) 10/13/2015   unable to get cpap, plans to get one in 2018   Pneumonia    Restless legs    Scoliosis     Shingles 08/28/2013   Shortness of breath    with exertion   Sleep apnea    Tongue cancer (HCC)    tongue cancer    Past Surgical History:  Procedure Laterality Date   BACK SURGERY  07/21/1978   scoliosis   CARPOMETACARPEL SUSPENSION PLASTY Left 01/21/2021   Procedure: Left Thumb Ligament Carpometacarpal Arthroplasty;  Surgeon: Romona Harari, MD;  Location: [facility] OR;  Service: Orthopedics;  Laterality: Left;   CHOLECYSTECTOMY N/A 08/15/2013   Procedure: LAPAROSCOPIC CHOLECYSTECTOMY WITH INTRAOPERATIVE CHOLANGIOGRAM;  Surgeon: Lynwood MALVA Pina, MD;  Location: [facility] OR;  Service: General;  Laterality: N/A;   COLONOSCOPY N/A 01/09/2013   Procedure: COLONOSCOPY;  Surgeon: Belvie JONETTA Just, MD;  Location: [facility] ENDOSCOPY;  Service: Endoscopy;  Laterality: N/A;   COLONOSCOPY  03/2018   DIRECT LARYNGOSCOPY  07/2016   Dr. Lauralee [facility]   ESOPHAGOGASTRODUODENOSCOPY  03/2018   ESOPHAGOGASTRODUODENOSCOPY (EGD) WITH PROPOFOL  N/A 04/24/2019   Procedure: ESOPHAGOGASTRODUODENOSCOPY (EGD) WITH PROPOFOL ;  Surgeon: Avram Lupita BRAVO, MD;  Location: [facility] ENDOSCOPY;  Service: Endoscopy;  Laterality: N/A;   GASTROSTOMY TUBE PLACEMENT  09/27/2016   GIVENS CAPSULE STUDY N/A 04/24/2019   Procedure: GIVENS CAPSULE STUDY;  Surgeon: Avram Lupita BRAVO, MD;  Location: [facility] ENDOSCOPY;  Service: Endoscopy;  Laterality: N/A;   HERNIA REPAIR Left 1981   IR PATIENT EVAL TECH 0-60 MINS  12/13/2016   IR PATIENT EVAL TECH 0-60 MINS  04/11/2017   IR PATIENT EVAL TECH 0-60 MINS  03/24/2018   IR PATIENT EVAL TECH 0-60 MINS  11/23/2018   IR PATIENT EVAL TECH 0-60 MINS  05/28/2019   IR PATIENT EVAL TECH 0-60 MINS  12/17/2022   IR REPLACE G-TUBE SIMPLE WO FLUORO  12/12/2017   IR REPLACE G-TUBE SIMPLE WO FLUORO  03/29/2018   IR REPLACE G-TUBE SIMPLE WO FLUORO  06/29/2018   IR REPLACE G-TUBE SIMPLE WO FLUORO  11/02/2018   IR REPLACE G-TUBE SIMPLE WO FLUORO  03/08/2019   IR REPLACE G-TUBE SIMPLE WO FLUORO  07/02/2019   IR REPLACE G-TUBE  SIMPLE WO FLUORO  11/30/2019   IR REPLACE G-TUBE SIMPLE WO FLUORO  12/27/2019   IR REPLACE G-TUBE SIMPLE WO FLUORO  05/27/2020   IR REPLACE G-TUBE SIMPLE WO FLUORO  07/09/2020   IR REPLACE G-TUBE SIMPLE WO FLUORO  10/23/2021   IR REPLACE G-TUBE SIMPLE WO FLUORO  08/19/2022   IR REPLACE G-TUBE SIMPLE WO FLUORO  07/13/2023   IR REPLC GASTRO/COLONIC TUBE PERCUT W/FLUORO  03/01/2022   MODIFIED RADICAL NECK DISSECTION Left 09/27/2016   Levels 1 & 2   PARTIAL GLOSSECTOMY Left 09/27/2016   Left hemi partial glossectomy   SPINE SURGERY  01/20/2016   fusion   TONSILLECTOMY     tracheotomy  09/27/2016   TUBAL LIGATION  06/1988   UPPER GASTROINTESTINAL ENDOSCOPY       Current Outpatient Medications  Medication Sig Dispense Refill   AMBULATORY NON FORMULARY MEDICATION Jevity 1.5 Sig: 6 cans per day, Bolus feeding  60ml syringes #30  Tape and gauze 180 Can 11   amLODipine  (NORVASC ) 10 MG tablet Place 1 tablet (10 mg total) into feeding tube daily. 90 tablet 0   amoxicillin -clavulanate (AUGMENTIN ) 875-125 MG tablet Take 1 tablet by mouth 2 (two) times daily.     aspirin  81 MG chewable tablet Place 81 mg into feeding tube daily.      baclofen  (LIORESAL ) 10 MG tablet Take 1 tablet (10 mg total) by mouth 2 (two) times daily. 60 tablet 2   betamethasone  dipropionate 0.05 % cream Apply 1 Application topically 2 (two) times daily as needed (eczema).     Blood Pressure Monitoring Soln KIT as directed Topical Once per day; Duration: 99 days     butalbital -acetaminophen -caffeine  (FIORICET ) 50-325-40 MG tablet Place 1 tablet into feeding tube every 6 (six) hours as needed for headache. 20 tablet 0   cholecalciferol  (VITAMIN D3) 10 MCG (400 UNIT) TABS tablet Place 1 tablet (400 Units total) into feeding tube daily. 30 tablet 11   clotrimazole  (MYCELEX ) 10 MG troche Take 1 tablet (10 mg total) by mouth 5 (five) times daily. 150 tablet 1   cyanocobalamin  (VITAMIN B12) 500 MCG tablet Take 1 tablet (500 mcg  total) by mouth daily. (Patient taking differently: Place 500 mcg into feeding tube daily.) 30 tablet 1   cyclobenzaprine  (FLEXERIL ) 10 MG tablet Take 5-10 mg by mouth at bedtime as needed.     dexlansoprazole  (DEXILANT ) 60 MG capsule ADMINISTER 1 CAPSULE BY MOUTH VIA FEEDING TUBE DAILY (Patient taking differently: Place 60 mg into feeding tube daily.) 90 capsule 3   diphenoxylate -atropine  (LOMOTIL ) 2.5-0.025 MG tablet Take 2 tablets by mouth 4 (four) times daily. 240 tablet 5   esomeprazole (NEXIUM) 40 MG capsule 1 cap(s) orally once a day; Duration: 30 day(s)     folic acid  (FOLVITE ) 400 MCG tablet Place 400 mcg into  feeding tube daily.     loratadine  (CLARITIN ) 10 MG tablet Place 10 mg into feeding tube daily.     naproxen  (NAPROSYN ) 500 MG tablet Take 500 mg by mouth every 12 (twelve) hours as needed for mild pain (pain score 1-3) or moderate pain (pain score 4-6).     ONE VITE FERROUS SULFATE  220 (44 Fe) MG/5ML SOLN TAKE 6.8MLS VIA FEEDING TUBE EVERY DAY 205 mL 6   potassium chloride  (KLOR-CON  M) 10 MEQ tablet Take 10 mEq by mouth daily. with food     potassium chloride  (KLOR-CON ) 20 MEQ packet Place 20 mEq into feeding tube daily. 90 packet 0   sertraline  (ZOLOFT ) 25 MG tablet TAKE 1 TABLET BY MOUTH EVERY DAY (Patient taking differently: Place 25 mg into feeding tube daily.) 90 tablet 1   TERBINAFINE EX Apply 1 drop topically at bedtime. Terbinafine 1000mg  Dmso susp. Apply 1 drop to all toenails nightly at bedtime.     acetaminophen  (TYLENOL ) 500 MG tablet Take 2 tablets (1,000 mg total) by mouth every 8 (eight) hours as needed for moderate pain or mild pain. (Patient taking differently: Place 1,000 mg into feeding tube every 8 (eight) hours as needed for moderate pain (pain score 4-6) or mild pain (pain score 1-3).) 80 tablet 3   Calcium  Citrate 200 MG TABS Place 200 mg into feeding tube daily at 12 noon.     diclofenac  (VOLTAREN ) 75 MG EC tablet TAKE 1 TABLET(75 MG) BY MOUTH TWICE DAILY WITH A  MEAL (Patient taking differently: Take 75 mg by mouth 2 (two) times daily as needed for mild pain (pain score 1-3) or moderate pain (pain score 4-6).) 60 tablet 0   ketoconazole (NIZORAL) 2 % cream Apply 1 Application topically 2 (two) times daily as needed for irritation.     lidocaine  (LMX) 4 % cream Apply 1 application  topically as needed (to affected sites- for pain).     Current Facility-Administered Medications  Medication Dose Route Frequency Provider Last Rate Last Admin   0.9 %  sodium chloride  infusion  500 mL Intravenous Once Avram Lupita BRAVO, MD        Allergies as of 12/20/2023 - Review Complete 12/20/2023  Allergen Reaction Noted   Sulfa antibiotics Hives 10/13/2020   Tomato Hives and Swelling 02/28/2022    Family History  Problem Relation Age of Onset   Heart disease Father    Heart attack Father    Hypertension Mother    Arthritis Mother    Diabetes Maternal Grandmother    Diabetes Paternal Grandmother    Diabetes Maternal Uncle    Colon polyps Neg Hx    Colon cancer Neg Hx    Esophageal cancer Neg Hx    Stomach cancer Neg Hx    Rectal cancer Neg Hx     Social History   Socioeconomic History   Marital status: Widowed    Spouse name: Not on file   Number of children: 3   Years of education: 57   Highest education level: 12th grade  Occupational History   Occupation: Administrator, Arts  Tobacco Use   Smoking status: Never   Smokeless tobacco: Never  Vaping Use   Vaping status: Never Used  Substance and Sexual Activity   Alcohol use: Yes    Alcohol/week: 4.0 standard drinks of alcohol    Types: 4 Cans of beer per week    Comment: occasionally   Drug use: Never   Sexual activity: Yes    Birth control/protection:  Surgical, Post-menopausal  Other Topics Concern   Not on file  Social History Narrative   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   Separated, 3 children   Never smoker no drug use occasional alcohol averaging about 7  drinks a week   fun: Play with her grandkids, read   Social Drivers of Corporate Investment Banker Strain: Low Risk  (12/02/2023)   Overall Financial Resource Strain (CARDIA)    Difficulty of Paying Living Expenses: Not hard at all  Food Insecurity: Food Insecurity Present (12/02/2023)   Hunger Vital Sign    Worried About Running Out of Food in the Last Year: Sometimes true    Ran Out of Food in the Last Year: Sometimes true  Transportation Needs: No Transportation Needs (12/02/2023)   PRAPARE - Administrator, Civil Service (Medical): No    Lack of Transportation (Non-Medical): No  Physical Activity: Insufficiently Active (12/02/2023)   Exercise Vital Sign    Days of Exercise per Week: 1 day    Minutes of Exercise per Session: 10 min  Stress: No Stress Concern Present (12/02/2023)   [employer] of Occupational Health - Occupational Stress Questionnaire    Feeling of Stress: Only a little  Social Connections: Moderately Isolated (12/02/2023)   Social Connection and Isolation Panel    Frequency of Communication with Friends and Family: Three times a week    Frequency of Social Gatherings with Friends and Family: Three times a week    Attends Religious Services: 1 to 4 times per year    Active Member of Clubs or Organizations: No    Attends Banker Meetings: Never    Marital Status: Never married  Intimate Partner Violence: Not At Risk (12/02/2023)   Humiliation, Afraid, Rape, and Kick questionnaire    Fear of Current or Ex-Partner: No    Emotionally Abused: No    Physically Abused: No    Sexually Abused: No    Review of Systems:  All other review of systems negative except as mentioned in the HPI.  Physical Exam: Vital signs BP (!) 148/94   Pulse 70   Temp (!) 97.3 F (36.3 C) (Temporal)   Ht 5' 7 (1.702 m)   Wt 186 lb (84.4 kg)   LMP 11/15/2013   SpO2 97%   BMI 29.13 kg/m   General:   Alert,  Well-developed, well-nourished,  pleasant and cooperative in NAD ENT - partial tongue resection Lungs:  Clear throughout to auscultation.   Heart:  Regular rate and rhythm; no murmurs, clicks, rubs,  or gallops. Abdomen:  Soft, nontender and nondistended. Normal bowel sounds.  Gastrostomy LUQ Neuro/Psych:  Alert and cooperative. Normal mood and affect. A and O x 3   @[place] Dann  CHARLENA Commander, MD, [facility] [phone] (pager) 12/20/2023 2:57 PM@

## 2023-12-21 ENCOUNTER — Telehealth: Payer: Self-pay

## 2023-12-21 NOTE — Telephone Encounter (Signed)
 Left message on follow up call.

## 2023-12-23 LAB — SURGICAL PATHOLOGY

## 2023-12-26 ENCOUNTER — Ambulatory Visit: Payer: Self-pay | Admitting: Internal Medicine

## 2023-12-28 ENCOUNTER — Ambulatory Visit (HOSPITAL_COMMUNITY)
Admission: RE | Admit: 2023-12-28 | Discharge: 2023-12-28 | Disposition: A | Discharge status: Home / Self Care | Source: Ambulatory Visit | Attending: Radiology | Admitting: Radiology

## 2023-12-28 ENCOUNTER — Other Ambulatory Visit (HOSPITAL_COMMUNITY): Payer: Self-pay | Admitting: Interventional Radiology

## 2023-12-28 DIAGNOSIS — Z431 Encounter for attention to gastrostomy: Secondary | ICD-10-CM | POA: Diagnosis present

## 2023-12-28 DIAGNOSIS — R633 Feeding difficulties, unspecified: Secondary | ICD-10-CM

## 2023-12-28 HISTORY — PX: IR REPLACE G-TUBE SIMPLE WO FLUORO: IMG2323

## 2024-01-02 ENCOUNTER — Ambulatory Visit (HOSPITAL_COMMUNITY): Admission: RE | Admit: 2024-01-02 | Discharge: 2024-01-02 | Attending: Adult Health | Admitting: Adult Health

## 2024-01-02 DIAGNOSIS — T66XXXD Radiation sickness, unspecified, subsequent encounter: Secondary | ICD-10-CM

## 2024-01-02 DIAGNOSIS — C01 Malignant neoplasm of base of tongue: Secondary | ICD-10-CM

## 2024-01-15 ENCOUNTER — Encounter: Payer: Self-pay | Admitting: Orthopedic Surgery

## 2024-01-16 MED ORDER — METHYLPREDNISOLONE 4 MG PO TBPK: ORAL_TABLET | ORAL | 0 refills | Status: AC

## 2024-01-18 ENCOUNTER — Other Ambulatory Visit

## 2024-01-18 ENCOUNTER — Encounter (HOSPITAL_COMMUNITY): Payer: Self-pay

## 2024-01-18 ENCOUNTER — Emergency Department (HOSPITAL_COMMUNITY)
Admission: EM | Admit: 2024-01-18 | Discharge: 2024-01-19 | Disposition: A | Discharge status: Home / Self Care | Source: Home / Self Care | Attending: Emergency Medicine | Admitting: Emergency Medicine

## 2024-01-18 ENCOUNTER — Other Ambulatory Visit: Payer: Self-pay

## 2024-01-18 DIAGNOSIS — I10 Essential (primary) hypertension: Secondary | ICD-10-CM | POA: Insufficient documentation

## 2024-01-18 DIAGNOSIS — K219 Gastro-esophageal reflux disease without esophagitis: Secondary | ICD-10-CM | POA: Diagnosis not present

## 2024-01-18 DIAGNOSIS — Z85810 Personal history of malignant neoplasm of tongue: Secondary | ICD-10-CM | POA: Insufficient documentation

## 2024-01-18 DIAGNOSIS — R1030 Lower abdominal pain, unspecified: Secondary | ICD-10-CM | POA: Diagnosis not present

## 2024-01-18 DIAGNOSIS — Z79899 Other long term (current) drug therapy: Secondary | ICD-10-CM | POA: Insufficient documentation

## 2024-01-18 DIAGNOSIS — D72819 Decreased white blood cell count, unspecified: Secondary | ICD-10-CM | POA: Insufficient documentation

## 2024-01-18 DIAGNOSIS — R112 Nausea with vomiting, unspecified: Secondary | ICD-10-CM | POA: Diagnosis present

## 2024-01-18 NOTE — ED Triage Notes (Signed)
 Upper abdominal pain from this morning, nausea and 1 episode of vomiting.   Says she suspects a UTI as she has been sleepier than normal and that's her typical symptoms.

## 2024-01-19 LAB — CBC
HCT: 41.3 % (ref 36.0–46.0)
Hemoglobin: 13.6 g/dL (ref 12.0–15.0)
MCH: 28.7 pg (ref 26.0–34.0)
MCHC: 32.9 g/dL (ref 30.0–36.0)
MCV: 87.1 fL (ref 80.0–100.0)
Platelets: 249 K/uL (ref 150–400)
RBC: 4.74 MIL/uL (ref 3.87–5.11)
RDW: 12.8 % (ref 11.5–15.5)
WBC: 6.5 K/uL (ref 4.0–10.5)
nRBC: 0 % (ref 0.0–0.2)

## 2024-01-19 LAB — COMPREHENSIVE METABOLIC PANEL WITH GFR
ALT: 7 U/L (ref 0–44)
AST: 16 U/L (ref 15–41)
Albumin: 4.6 g/dL (ref 3.5–5.0)
Alkaline Phosphatase: 125 U/L (ref 38–126)
Anion gap: 11 (ref 5–15)
BUN: 16 mg/dL (ref 6–20)
CO2: 26 mmol/L (ref 22–32)
Calcium: 9.8 mg/dL (ref 8.9–10.3)
Chloride: 104 mmol/L (ref 98–111)
Creatinine, Ser: 0.61 mg/dL (ref 0.44–1.00)
GFR, Estimated: >60 mL/min
Glucose, Bld: 97 mg/dL (ref 70–99)
Potassium: 3.6 mmol/L (ref 3.5–5.1)
Sodium: 140 mmol/L (ref 135–145)
Total Bilirubin: 0.7 mg/dL (ref 0.0–1.2)
Total Protein: 7.5 g/dL (ref 6.5–8.1)

## 2024-01-19 LAB — URINALYSIS, ROUTINE W REFLEX MICROSCOPIC
Bilirubin Urine: NEGATIVE
Glucose, UA: NEGATIVE mg/dL
Hgb urine dipstick: NEGATIVE
Ketones, ur: 5 mg/dL — AB
Nitrite: NEGATIVE
Protein, ur: NEGATIVE mg/dL
Specific Gravity, Urine: 1.018 (ref 1.005–1.030)
pH: 5 (ref 5.0–8.0)

## 2024-01-19 LAB — LIPASE, BLOOD: Lipase: <10 U/L — ABNORMAL LOW (ref 11–51)

## 2024-01-19 MED ORDER — ONDANSETRON 8 MG PO TBDP
8.0000 mg | ORAL_TABLET | Freq: Once | ORAL | Status: AC
  Administered 2024-01-19: 8 mg via ORAL
  Filled 2024-01-19: qty 1

## 2024-01-19 MED ORDER — ONDANSETRON 4 MG PO TBDP: 4.0000 mg | ORAL_TABLET | Freq: Three times a day (TID) | ORAL | 0 refills | Status: AC | PRN

## 2024-01-19 NOTE — Discharge Instructions (Addendum)
 Your workup this morning was reassuring.  I have prescribed Zofran  to help with your nausea.  Please follow-up with your primary care provider.  Return to the emergency department if you develop any life-threatening symptoms.

## 2024-01-19 NOTE — ED Provider Notes (Signed)
" °  Physical Exam  BP (!) 127/103   Pulse 70   Temp 97.8 F (36.6 C) (Oral)   Resp 17   LMP 11/15/2013   SpO2 97%   Physical Exam Cardiovascular:     Rate and Rhythm: Normal rate and regular rhythm.  Pulmonary:     Effort: Pulmonary effort is normal.     Breath sounds: Normal breath sounds.  Abdominal:     General: Abdomen is flat. Bowel sounds are normal.     Palpations: Abdomen is soft.  Musculoskeletal:     Right lower leg: No edema.     Left lower leg: No edema.  Skin:    Capillary Refill: Capillary refill takes less than 2 seconds.  Neurological:     Mental Status: She is alert and oriented to person, place, and time. Mental status is at baseline.     Procedures  Procedures  ED Course / MDM   Clinical Course as of 01/19/24 0715  Thu Jan 19, 2024  [id number] Sign out from [place] at shift change [place], will trial zofran  and PO challenge. Passes goes home.  [JB]  0700 Reassessed and patient is resting comfortably, well appearing. PO challenging now. [JB]  [id number] Patient passed PO test here. Vitals stable. Not requiring admission into the hospital or further workup at this time. Given return precautions.  [JB]    Clinical Course User Index [JB] Jaeger Trueheart, Warren SAILOR, PA-C   Medical Decision Making Amount and/or Complexity of Data Reviewed Labs: ordered.  Risk Prescription drug management.          Shermon Warren SAILOR, PA-C 01/19/24 0715    Emil Share, DO 01/19/24 2258  "

## 2024-01-19 NOTE — ED Provider Notes (Signed)
 " [place] [facility] AT [facility] Provider Note   CSN: [id number] Arrival date & time: 01/18/24  2215     Patient presents with: Abdominal Pain   Kristen [name] is a 59 y.o. female.  Patient with past medical history significant for hypertension, anemia of chronic disease, GERD, tongue cancer, G-tube in place presents to the emergency department with concerns of a possible UTI.  She endorses nausea with 1 episode of vomiting and some intermittent lower abdominal pain.  She states that she frequently has similar symptoms with UTIs.  She denies fever, chest pain, shortness of breath, dysuria.    Abdominal Pain      Prior to Admission medications  Medication Sig Start Date End Date Taking? Authorizing Provider  ondansetron  (ZOFRAN -ODT) 4 MG disintegrating tablet Take 1 tablet (4 mg total) by mouth every 8 (eight) hours as needed for nausea or vomiting. 01/19/24  Yes Logan Ubaldo NOVAK, PA-C  acetaminophen  (TYLENOL ) 500 MG tablet Take 2 tablets (1,000 mg total) by mouth every 8 (eight) hours as needed for moderate pain or mild pain. Patient taking differently: Place 1,000 mg into feeding tube every 8 (eight) hours as needed for moderate pain (pain score 4-6) or mild pain (pain score 1-3). 04/21/22   Georgina Ozell LABOR, MD  AMBULATORY NON FORMULARY MEDICATION Jevity 1.5 Sig: 6 cans per day, Bolus feeding  60ml syringes #30  Tape and gauze 03/12/22   Avram Lupita BRAVO, MD  amLODipine  (NORVASC ) 10 MG tablet Place 1 tablet (10 mg total) into feeding tube daily. 08/20/23 12/20/23  [place], Eric J, DO  amoxicillin -clavulanate (AUGMENTIN ) 875-125 MG tablet Take 1 tablet by mouth 2 (two) times daily.    [provider]  aspirin  81 MG chewable tablet Place 81 mg into feeding tube daily.  10/05/16   [provider]  betamethasone  dipropionate 0.05 % cream Apply 1 Application topically 2 (two) times daily as needed (eczema). 04/06/23   [provider]   Blood Pressure Monitoring Soln KIT as directed Topical Once per day; Duration: 99 days 05/03/23   [provider]  butalbital -acetaminophen -caffeine  (FIORICET ) 50-325-40 MG tablet Place 1 tablet into feeding tube every 6 (six) hours as needed for headache. 08/20/23 08/19/24  [place], Camellia PARAS, DO  Calcium  Citrate 200 MG TABS Place 200 mg into feeding tube daily at 12 noon.    [provider]  cholecalciferol  (VITAMIN D3) 10 MCG (400 UNIT) TABS tablet Place 1 tablet (400 Units total) into feeding tube daily. 03/31/23   Avram Lupita BRAVO, MD  clotrimazole  (MYCELEX ) 10 MG troche Take 1 tablet (10 mg total) by mouth 5 (five) times daily. 11/16/23   Avram Lupita BRAVO, MD  cyanocobalamin  (VITAMIN B12) 500 MCG tablet Take 1 tablet (500 mcg total) by mouth daily. Patient taking differently: Place 500 mcg into feeding tube daily. 04/28/23   Samtani, Jai-Gurmukh, MD  cyclobenzaprine  (FLEXERIL ) 10 MG tablet Take 5-10 mg by mouth at bedtime as needed. 10/10/23   [provider]  dexlansoprazole  (DEXILANT ) 60 MG capsule ADMINISTER 1 CAPSULE BY MOUTH VIA FEEDING TUBE DAILY Patient taking differently: Place 60 mg into feeding tube daily. 03/31/23   Avram Lupita BRAVO, MD  diclofenac  (VOLTAREN ) 75 MG EC tablet TAKE 1 TABLET(75 MG) BY MOUTH TWICE DAILY WITH A MEAL Patient taking differently: Take 75 mg by mouth 2 (two) times daily as needed for mild pain (pain score 1-3) or moderate pain (pain score 4-6). 05/19/23   Georgina Ozell LABOR, MD  diphenoxylate -atropine  (  LOMOTIL ) 2.5-0.025 MG tablet Take 2 tablets by mouth 4 (four) times daily. 11/16/23   Avram Lupita BRAVO, MD  esomeprazole (NEXIUM) 40 MG capsule 1 cap(s) orally once a day; Duration: 30 day(s) 02/12/13   [provider]  folic acid  (FOLVITE ) 400 MCG tablet Place 400 mcg into feeding tube daily.    [provider]  ketoconazole (NIZORAL) 2 % cream Apply 1 Application topically 2 (two) times daily as needed for irritation. 04/06/23    [provider]  lidocaine  (LMX) 4 % cream Apply 1 application  topically as needed (to affected sites- for pain).    [provider]  loratadine  (CLARITIN ) 10 MG tablet Place 10 mg into feeding tube daily.    [provider]  methylPREDNISolone  (MEDROL  DOSEPAK) 4 MG TBPK tablet Take as prescribed on the box 01/16/24   Georgina Ozell LABOR, MD  naproxen  (NAPROSYN ) 500 MG tablet Take 500 mg by mouth every 12 (twelve) hours as needed for mild pain (pain score 1-3) or moderate pain (pain score 4-6). 03/30/23   [provider]  ONE VITE FERROUS SULFATE  220 (44 Fe) MG/5ML SOLN TAKE 6.8MLS VIA FEEDING TUBE EVERY DAY 12/08/23   Avram Lupita BRAVO, MD  potassium chloride  (KLOR-CON  M) 10 MEQ tablet Take 10 mEq by mouth daily. with food 11/30/23   [provider]  potassium chloride  (KLOR-CON ) 20 MEQ packet Place 20 mEq into feeding tube daily. 08/20/23 12/20/23  [place], Camellia PARAS, DO  sertraline  (ZOLOFT ) 25 MG tablet TAKE 1 TABLET BY MOUTH EVERY DAY Patient taking differently: Place 25 mg into feeding tube daily. 04/26/22   Mercer Clotilda SAUNDERS, MD  TERBINAFINE EX Apply 1 drop topically at bedtime. Terbinafine 1000mg  Dmso susp. Apply 1 drop to all toenails nightly at bedtime. 04/06/23   [provider]    Allergies: Sulfa antibiotics and Tomato    Review of Systems  Gastrointestinal:  Positive for abdominal pain.    Updated Vital Signs BP (!) 127/103   Pulse 70   Temp 97.8 F (36.6 C) (Oral)   Resp 17   LMP 11/15/2013   SpO2 97%   Physical Exam Vitals and nursing note reviewed.  Constitutional:      General: She is not in acute distress.    Appearance: She is well-developed.  HENT:     Head: Normocephalic and atraumatic.  Eyes:     Conjunctiva/sclera: Conjunctivae normal.  Cardiovascular:     Rate and Rhythm: Normal rate and regular rhythm.     Heart sounds: No murmur heard. Pulmonary:     Effort: Pulmonary effort is normal. No respiratory distress.      Breath sounds: Normal breath sounds.  Abdominal:     Palpations: Abdomen is soft.     Tenderness: There is no abdominal tenderness.     Comments: G-tube in place  Musculoskeletal:        General: No swelling.     Cervical back: Neck supple.  Skin:    General: Skin is warm and dry.     Capillary Refill: Capillary refill takes less than 2 seconds.  Neurological:     Mental Status: She is alert.  Psychiatric:        Mood and Affect: Mood normal.     (all labs ordered are listed, but only abnormal results are displayed) Labs Reviewed  LIPASE, BLOOD - Abnormal; Notable for the following components:      Result Value   Lipase <10 (*)    All other  components within normal limits  URINALYSIS, ROUTINE W REFLEX MICROSCOPIC - Abnormal; Notable for the following components:   Ketones, ur 5 (*)    Leukocytes,Ua MODERATE (*)    Bacteria, UA RARE (*)    All other components within normal limits  COMPREHENSIVE METABOLIC PANEL WITH GFR  CBC    EKG: None  Radiology: No results found.   Procedures   Medications Ordered in the ED  ondansetron  (ZOFRAN -ODT) disintegrating tablet 8 mg (8 mg Oral Given 01/19/24 0606)    Clinical Course as of 01/19/24 0641  Thu Jan 19, 2024  0637 NV, will trial zofran  and PO challenge. Passes goes home.  [JB]    Clinical Course User Index [JB] Barrett, Warren SAILOR, PA-C                                 Medical Decision Making Amount and/or Complexity of Data Reviewed Labs: ordered.  Risk Prescription drug management.   This patient presents to the ED for concern of abdominal pain with nausea and vomiting, this involves an extensive number of treatment options, and is a complaint that carries with it a high risk of complications and morbidity.  The differential diagnosis includes urinary tract infection, surgical abdomen, gastritis, gastroenteritis, others   Co morbidities / Chronic conditions that complicate the patient evaluation  As noted  in HPI   Additional history obtained:  Additional history obtained from EMR   Lab Tests:  I Ordered, and personally interpreted labs.  The pertinent results include: Unremarkable CMP, CBC; UA with moderate leukocytes but rare bacteria, nitrate negative   Imaging Studies ordered:  Patient with no abdominal tenderness on exam.  No sign of surgical/acute abdomen.  No indication for emergent abdominal imaging   Cardiac Monitoring: / EKG:  The patient was maintained on a cardiac monitor.  I personally viewed and interpreted the cardiac monitored which showed an underlying rhythm of: Sinus rhythm   Problem List / ED Course / Critical interventions / Medication management   I ordered medication including Zofran  Reevaluation of the patient after these medicines showed that the patient improved   Test / Admission - Considered:  Patient with nausea, intermittent abdominal pain, emesis.  UA not showing overt signs of infection at this time.  Other lab work grossly unremarkable.  No abdominal tenderness on examination.  The patient has had no episodes of emesis while here in the emergency department.  Patient has been given Zofran .  Plan to p.o. challenge after Zofran .  Patient is able to pass p.o. challenge will discharge home with prescription for Zofran .  Patient care being transferred to [facility], PA-C at shift handoff.  Disposition pending reassessment after Zofran .      Final diagnoses:  Lower abdominal pain  Nausea and vomiting, unspecified vomiting type    ED Discharge Orders          Ordered    ondansetron  (ZOFRAN -ODT) 4 MG disintegrating tablet  Every 8 hours PRN        01/19/24 0613               Logan Ubaldo NOVAK, PA-C 01/19/24 0641    Emil Share, DO 01/19/24 [phone]  "

## 2024-01-24 ENCOUNTER — Other Ambulatory Visit (INDEPENDENT_AMBULATORY_CARE_PROVIDER_SITE_OTHER): Payer: Self-pay

## 2024-01-24 ENCOUNTER — Ambulatory Visit: Admitting: Orthopedic Surgery

## 2024-01-24 DIAGNOSIS — M25521 Pain in right elbow: Secondary | ICD-10-CM

## 2024-01-24 MED ORDER — DICLOFENAC SODIUM 75 MG PO TBEC: 75.0000 mg | DELAYED_RELEASE_TABLET | Freq: Two times a day (BID) | ORAL | 1 refills | Status: AC | PRN

## 2024-01-24 NOTE — Progress Notes (Signed)
 Orthopedic Surgery Progress Note   Assessment: Patient is a 59 y.o. female with right lateral epicondyle pain   Plan: -No operative plans at this time -Prescribe diclofenac  for her.  Told her to use diclofenac  gel over the area as well -Referred her to OT -If she is not doing any better at next visit, we will consider a MRI -Weight bearing status: as tolerated - Patient should follow-up in 6 weeks, x-rays at next visit: None  ___________________________________________________________________________  History: Patient has had about 2 weeks of right elbow pain.  She feels it along the radial aspect of the right elbow.  Able sometimes go into the dorsal forearm as well.  It does not go past the mid forearm.  She notes the pain is worse with activity.  There are times that the pain causes her to have difficulty using the hand.  Right now, the pain is better.  She recently completed a Medrol  Dosepak which did help.  She is able to move the arm better.  She has no pain in her neck.  No pain proximal the elbow.  She has not had pain like this before.  She has no pain in the left upper extremity.   Physical Exam:  General: no acute distress, appears stated age Neurologic: alert, answering questions appropriately, following commands Respiratory: unlabored breathing on room air, symmetric chest rise Psychiatric: appropriate affect, normal cadence to speech  MSK:   -Right upper extremity: No pain through passive range of motion from full extension to 90 degrees of flexion, past 90 degrees of flexion patient had pain, tender to palpation over the lateral epicondyle, no other tenderness palpation over the elbow, no open wounds seen, no fluctuance or fluid collection seen, increased pain with resisted wrist extension, AIN/PIN/IO intact, sensation intact to light touch in median/radial/ulnar nerve distributions, palpable radial pulse  Imaging: XRs of the right elbow from 01/24/2024 were independently  reviewed and interpreted, showing no fracture or dislocation.  No significant degenerative changes seen within the elbow joint.   Patient name: Kristen [name] Patient MRN: [id number] Date: 01/24/2024

## 2024-01-31 ENCOUNTER — Encounter: Payer: Self-pay | Admitting: Internal Medicine

## 2024-02-01 ENCOUNTER — Other Ambulatory Visit (HOSPITAL_COMMUNITY): Payer: Self-pay

## 2024-02-01 ENCOUNTER — Telehealth: Payer: Self-pay

## 2024-02-01 NOTE — Telephone Encounter (Signed)
 Pharmacy Patient Advocate Encounter   Received notification from Patient Advice Request messages that prior authorization for Dexlansoprazole  60MG  dr capsules is required/requested.   Insurance verification completed.   The patient is insured through [employer].   Per test claim: PA required; PA submitted to above mentioned insurance via Latent Key/confirmation #/EOC [id number] Status is pending

## 2024-02-01 NOTE — Telephone Encounter (Signed)
 PA request has been Submitted. New Encounter has been or will be created for follow up. For additional info see Pharmacy Prior Auth telephone encounter from 02-01-2024.

## 2024-02-02 MED ORDER — LANSOPRAZOLE 30 MG PO CPDR: 30.0000 mg | DELAYED_RELEASE_CAPSULE | Freq: Two times a day (BID) | ORAL | 3 refills | Status: AC

## 2024-02-02 NOTE — Telephone Encounter (Signed)
 Please let her know that I sent in a prescription for lansoprazole  30 mg twice daily.  Thanks a lot is essentially that but in 1 pill so hopefully it will work the same.

## 2024-02-02 NOTE — Telephone Encounter (Signed)
 Left message on machine to call back

## 2024-02-02 NOTE — Telephone Encounter (Signed)
 Pharmacy Patient Advocate Encounter  Received notification from [employer] that Prior Authorization for Dexlansoprazole  60MG  dr capsules has been DENIED.  Full denial letter will be uploaded to the media tab. See denial reason below.  The drug you asked for is not listed in your preferred drug list (formulary). The preferred drug(s), you may not have tried are: lansoprazole  capsule, delayed release. Your provider needs to give us  medical reasons why the preferred drug(s) would not work for you and/or would have bad side effects.  Sometimes a preferred drug needs more review for approval. Additionally, some preferred drugs listed may be the same drugs with different strengths or forms.   PA #/Case ID/Reference #: [id number]

## 2024-02-03 NOTE — Telephone Encounter (Signed)
 Left message advising patient that insurance has denied dexlansoprazole  that we originally sent for her and instead required we provide her with one of their preferred medications. Dr Avram has sent in a new prescription for lansoprazole  30 mg twice daily instead and she is to make us  aware if this medication is ineffective for her.

## 2024-02-07 ENCOUNTER — Other Ambulatory Visit

## 2024-02-07 ENCOUNTER — Other Ambulatory Visit (HOSPITAL_COMMUNITY)

## 2024-02-07 DIAGNOSIS — Z006 Encounter for examination for normal comparison and control in clinical research program: Secondary | ICD-10-CM

## 2024-02-09 ENCOUNTER — Encounter: Payer: Self-pay | Admitting: Internal Medicine

## 2024-02-13 ENCOUNTER — Ambulatory Visit

## 2024-02-14 LAB — GENECONNECT MOLECULAR SCREEN: Genetic Analysis Overall Interpretation: NEGATIVE

## 2024-02-24 NOTE — Therapy (Incomplete)
 " OUTPATIENT OCCUPATIONAL THERAPY ORTHO EVALUATION  Patient Name: Kristen [name] MRN: [id number] DOB:03-Apr-1965, 59 y.o., female Today's Date: 02/24/2024  PCP: Sigrid Deidra Fox, MD REFERRING PROVIDER: Georgina Ozell LABOR, MD  END OF SESSION:   Past Medical History:  Diagnosis Date   Allergy    seasonal   Anemia    Iron deficiency   Anxiety    Arthritis    back, left shoulder   Blood transfusion without reported diagnosis    Chicken pox    Depression    Diarrhea    takes Imodium  daily   GERD (gastroesophageal reflux disease)    H/O hiatal hernia    Headache(784.0)    History of kidney stones    1996ish   History of radiation therapy 11/16/16- 01/01/17   Base of Tongue/ 66 gy in 33 fractions/ Dose: 2 Gy   Hypertension    Iron deficiency anemia 12/11/2018   Migraine    none for 5 years (as of 01/13/16)   OSA (obstructive sleep apnea) 10/13/2015   unable to get cpap, plans to get one in 2018   Pneumonia    Restless legs    Scoliosis    Shingles 08/28/2013   Shortness of breath    with exertion   Sleep apnea    Tongue cancer (HCC)    tongue cancer   Past Surgical History:  Procedure Laterality Date   BACK SURGERY  07/21/1978   scoliosis   CARPOMETACARPEL SUSPENSION PLASTY Left 01/21/2021   Procedure: Left Thumb Ligament Carpometacarpal Arthroplasty;  Surgeon: Romona Harari, MD;  Location: [facility] OR;  Service: Orthopedics;  Laterality: Left;   CHOLECYSTECTOMY N/A 08/15/2013   Procedure: LAPAROSCOPIC CHOLECYSTECTOMY WITH INTRAOPERATIVE CHOLANGIOGRAM;  Surgeon: Lynwood MALVA Pina, MD;  Location: [facility] OR;  Service: General;  Laterality: N/A;   COLONOSCOPY N/A 01/09/2013   Procedure: COLONOSCOPY;  Surgeon: Belvie JONETTA Just, MD;  Location: [facility] ENDOSCOPY;  Service: Endoscopy;  Laterality: N/A;   COLONOSCOPY  03/2018   DIRECT LARYNGOSCOPY  07/2016   Dr. [facility]   ESOPHAGOGASTRODUODENOSCOPY  03/2018   ESOPHAGOGASTRODUODENOSCOPY (EGD) WITH PROPOFOL  N/A 04/24/2019    Procedure: ESOPHAGOGASTRODUODENOSCOPY (EGD) WITH PROPOFOL ;  Surgeon: Avram Lupita BRAVO, MD;  Location: [facility] ENDOSCOPY;  Service: Endoscopy;  Laterality: N/A;   GASTROSTOMY TUBE PLACEMENT  09/27/2016   GIVENS CAPSULE STUDY N/A 04/24/2019   Procedure: GIVENS CAPSULE STUDY;  Surgeon: Avram Lupita BRAVO, MD;  Location: [facility] ENDOSCOPY;  Service: Endoscopy;  Laterality: N/A;   HERNIA REPAIR Left 1981   IR PATIENT EVAL TECH 0-60 MINS  12/13/2016   IR PATIENT EVAL TECH 0-60 MINS  04/11/2017   IR PATIENT EVAL TECH 0-60 MINS  03/24/2018   IR PATIENT EVAL TECH 0-60 MINS  11/23/2018   IR PATIENT EVAL TECH 0-60 MINS  05/28/2019   IR PATIENT EVAL TECH 0-60 MINS  12/17/2022   IR REPLACE G-TUBE SIMPLE WO FLUORO  12/12/2017   IR REPLACE G-TUBE SIMPLE WO FLUORO  03/29/2018   IR REPLACE G-TUBE SIMPLE WO FLUORO  06/29/2018   IR REPLACE G-TUBE SIMPLE WO FLUORO  11/02/2018   IR REPLACE G-TUBE SIMPLE WO FLUORO  03/08/2019   IR REPLACE G-TUBE SIMPLE WO FLUORO  07/02/2019   IR REPLACE G-TUBE SIMPLE WO FLUORO  11/30/2019   IR REPLACE G-TUBE SIMPLE WO FLUORO  12/27/2019   IR REPLACE G-TUBE SIMPLE WO FLUORO  05/27/2020   IR REPLACE G-TUBE SIMPLE WO FLUORO  07/09/2020   IR REPLACE G-TUBE SIMPLE WO FLUORO  10/23/2021  IR REPLACE G-TUBE SIMPLE WO FLUORO  08/19/2022   IR REPLACE G-TUBE SIMPLE WO FLUORO  07/13/2023   IR REPLACE G-TUBE SIMPLE WO FLUORO  12/28/2023   IR REPLC GASTRO/COLONIC TUBE PERCUT W/FLUORO  03/01/2022   MODIFIED RADICAL NECK DISSECTION Left 09/27/2016   Levels 1 & 2   PARTIAL GLOSSECTOMY Left 09/27/2016   Left hemi partial glossectomy   SPINE SURGERY  01/20/2016   fusion   TONSILLECTOMY     tracheotomy  09/27/2016   TUBAL LIGATION  06/1988   UPPER GASTROINTESTINAL ENDOSCOPY     Patient Active Problem List   Diagnosis Date Noted   Urinary tract infection 08/17/2023   Hypokalemia 08/17/2023   S/P percutaneous endoscopic gastrostomy (PEG) tube placement (HCC) 08/17/2023   Acute encephalopathy  04/22/2023   Enteritis 01/12/2023   Generalized weakness 07/18/2022   Dehydration 07/18/2022   Tension headache 07/18/2022   Depression 07/18/2022   History of tongue cancer 07/18/2022   PEG tube malfunction (HCC) 02/28/2022   Protein-calorie malnutrition, severe 04/29/2021   Pseudoarthrosis of lumbar spine 04/28/2021   Fusion of spine of lumbar region 04/28/2021   Pseudarthrosis after fusion or arthrodesis 04/20/2021    Class: Chronic   Tenosynovitis, de Quervain 12/14/2020   Arthritis of carpometacarpal (CMC) joint of left thumb 12/14/2020   Aortic atherosclerosis 08/20/2020   Hyperkalemia 07/24/2020   Alcohol use 08/05/2019   Bilateral lower extremity edema 08/05/2019   Peripheral edema 03/12/2019   Iron deficiency anemia 12/11/2018   Loosening of hardware in spine    Other spondylosis with radiculopathy, lumbar region    History of lumbar spinal fusion 11/06/2018   UTI (urinary tract infection) 05/15/2017   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   Malignant neoplasm of base of tongue (HCC) 10/29/2016   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   Headache 05/14/2016   Tongue lesion 05/14/2016   Thrombocytopenia 01/22/2016   Chest pain    Essential hypertension    Spinal stenosis of lumbar region 01/20/2016    Class: Chronic   DDD (degenerative disc disease), lumbar 01/20/2016    Class: Chronic   Spinal stenosis of lumbar region with neurogenic claudication 01/20/2016   Low back pain 12/22/2015   Hypersomnolence 10/13/2015   Muscle cramp 08/01/2015   GERD (gastroesophageal reflux disease) 07/09/2015   Anemia of chronic disease 01/08/2013    ONSET DATE: 01/24/2024  REFERRING DIAG: M25.521 (ICD-10-CM) - Pain in right elbow  THERAPY DIAG:  No diagnosis found.  Rationale for Evaluation and Treatment: Rehabilitation  SUBJECTIVE:   SUBJECTIVE STATEMENT: *** Pt accompanied by: {accompnied:27141}  PERTINENT HISTORY: per 01/24/24  orthopedic office visit:  Patient has had about 2 weeks of right elbow pain. She feels it along the radial aspect of the right elbow. Able sometimes go into the dorsal forearm as well. It does not go past the mid forearm. She notes the pain is worse with activity. There are times that the pain causes her to have difficulty using the hand. Right now, the pain is better. She recently completed a Medrol  Dosepak which did help. She is able to move the arm better. She has no pain in her neck. No pain proximal the elbow. She has not had pain like this before. She has no pain in the left upper extremity.   PRECAUTIONS: Other: no electrical modalities (hx of tongue cancer),   RED FLAGS: {PT Red Flags:29287}   WEIGHT BEARING RESTRICTIONS: {Yes ***/No:24003}  PAIN:  Are you having  pain? {OPRCPAIN:27236}  FALLS: Has patient fallen in last 6 months? {fallsyesno:27318}  LIVING ENVIRONMENT: Lives with: {OPRC lives with:25569::lives with their family} Lives in: {Lives in:25570} Stairs: {opstairs:27293} Has following equipment at home: {Assistive devices:23999}  PLOF: {PLOF:24004}  PATIENT GOALS: ***  NEXT MD VISIT: ***  OBJECTIVE:  Note: Objective measures were completed at Evaluation unless otherwise noted.  HAND DOMINANCE: {MISC; OT HAND DOMINANCE:[phone]}  ADLs: {ADLs OT:31716}  FUNCTIONAL OUTCOME MEASURES: {OTFUNCTIONALMEASURES:27238}  UPPER EXTREMITY ROM:     {AROM/PROM:27142} ROM Right eval Left eval  Shoulder flexion    Shoulder abduction    Shoulder adduction    Shoulder extension    Shoulder internal rotation    Shoulder external rotation    Elbow flexion    Elbow extension    Wrist flexion    Wrist extension    Wrist ulnar deviation    Wrist radial deviation    Wrist pronation    Wrist supination    (Blank rows = not tested)  {AROM/PROM:27142} ROM Right eval Left eval  Thumb MCP (0-60)    Thumb IP (0-80)    Thumb Radial abd/add (0-55)     Thumb Palmar  abd/add (0-45)     Thumb Opposition to Small Finger     Index MCP (0-90)     Index PIP (0-100)     Index DIP (0-70)      Long MCP (0-90)      Long PIP (0-100)      Long DIP (0-70)      Ring MCP (0-90)      Ring PIP (0-100)      Ring DIP (0-70)      Little MCP (0-90)      Little PIP (0-100)      Little DIP (0-70)      (Blank rows = not tested)   UPPER EXTREMITY MMT:     MMT Right eval Left eval  Shoulder flexion    Shoulder abduction    Shoulder adduction    Shoulder extension    Shoulder internal rotation    Shoulder external rotation    Middle trapezius    Lower trapezius    Elbow flexion    Elbow extension    Wrist flexion    Wrist extension    Wrist ulnar deviation    Wrist radial deviation    Wrist pronation    Wrist supination    (Blank rows = not tested)  HAND FUNCTION: {handfunction:27230}  COORDINATION: {otcoordination:27237}  SENSATION: {sensation:27233}  EDEMA: ***  COGNITION: Overall cognitive status: {cognition:24006} Areas of impairment: {impairedcognition:27234}  OBSERVATIONS: ***   TREATMENT DATE: ***                                                                                                                            Modalities: {OPRCMODALITIES:31717}     PATIENT EDUCATION: Education details: *** Person educated: {Person educated:25204} Education method: {Education Method:25205} Education comprehension: {Education Comprehension:25206}  HOME EXERCISE  PROGRAM: ***  GOALS: Goals reviewed with patient? {yes/no:20286}  SHORT TERM GOALS: Target date: ***  *** Baseline: Goal status: INITIAL  2.  *** Baseline:  Goal status: INITIAL  3.  *** Baseline:  Goal status: INITIAL  4.  *** Baseline:  Goal status: INITIAL  5.  *** Baseline:  Goal status: INITIAL  6.  *** Baseline:  Goal status: INITIAL  LONG TERM GOALS: Target date: ***  *** Baseline:  Goal status: INITIAL  2.  *** Baseline:  Goal  status: INITIAL  3.  *** Baseline:  Goal status: INITIAL  4.  *** Baseline:  Goal status: INITIAL  5.  *** Baseline:  Goal status: INITIAL  6.  *** Baseline:  Goal status: INITIAL  ASSESSMENT:  CLINICAL IMPRESSION: Patient is a 59 y.o. female who was seen today for occupational therapy evaluation for R elbow pain. Hx includes hiatal hernia, tongue cancer, arthritis, anemia, arthritis L shoulder. Patient currently presents below baseline level of functioning demonstrating functional deficits and impairments as noted below. Pt would benefit from skilled OT services in the outpatient setting to work on impairments as noted below to help pt return to PLOF as able.     PERFORMANCE DEFICITS: in functional skills including {OT physical skills:25468}, cognitive skills including {OT cognitive skills:25469}, and psychosocial skills including {OT psychosocial skills:25470}.   IMPAIRMENTS: are limiting patient from {OT performance deficits:25471}.   COMORBIDITIES: {Comorbidities:25485} that affects occupational performance. Patient will benefit from skilled OT to address above impairments and improve overall function.  MODIFICATION OR ASSISTANCE TO COMPLETE EVALUATION: {OT modification:25474}  OT OCCUPATIONAL PROFILE AND HISTORY: {OT PROFILE AND HISTORY:25484}  CLINICAL DECISION MAKING: {OT CDM:25475}  REHAB POTENTIAL: {rehabpotential:25112}  EVALUATION COMPLEXITY: {Evaluation complexity:25115}      PLAN:  OT FREQUENCY: {rehab frequency:25116}  OT DURATION: {rehab duration:25117}  PLANNED INTERVENTIONS: {OT Interventions:25467}  RECOMMENDED OTHER SERVICES: ***  CONSULTED AND AGREED WITH PLAN OF CARE: {ENR:74513}  PLAN FOR NEXT SESSION: PIERRETTE Rocky Dutch, OT 02/24/2024, 3:34 PM   "

## 2024-02-27 ENCOUNTER — Ambulatory Visit

## 2024-03-07 ENCOUNTER — Ambulatory Visit: Admitting: Orthopedic Surgery

## 2024-03-16 ENCOUNTER — Other Ambulatory Visit (HOSPITAL_COMMUNITY)

## 2024-04-27 ENCOUNTER — Other Ambulatory Visit (HOSPITAL_COMMUNITY)

## 2024-12-03 ENCOUNTER — Inpatient Hospital Stay: Payer: Self-pay | Attending: Adult Health | Admitting: Adult Health
